# Patient Record
Sex: Male | Born: 1985 | Race: Black or African American | Hispanic: No | Marital: Single | Smoking: Current some day smoker
Health system: Southern US, Community
[De-identification: ages and names within clinical notes are randomized; demographics above are authoritative.]

## PROBLEM LIST (undated history)

## (undated) DIAGNOSIS — F419 Anxiety disorder, unspecified: Secondary | ICD-10-CM

## (undated) DIAGNOSIS — I82890 Acute embolism and thrombosis of other specified veins: Secondary | ICD-10-CM

## (undated) DIAGNOSIS — E119 Type 2 diabetes mellitus without complications: Secondary | ICD-10-CM

## (undated) DIAGNOSIS — K859 Acute pancreatitis without necrosis or infection, unspecified: Secondary | ICD-10-CM

## (undated) HISTORY — PX: NO PAST SURGERIES: SHX2092

## (undated) HISTORY — DX: Anxiety disorder, unspecified: F41.9

## (undated) HISTORY — DX: Type 2 diabetes mellitus without complications: E11.9

## (undated) HISTORY — DX: Acute pancreatitis without necrosis or infection, unspecified: K85.90

---

## 2019-07-03 ENCOUNTER — Encounter (INDEPENDENT_AMBULATORY_CARE_PROVIDER_SITE_OTHER): Payer: Self-pay

## 2019-07-03 ENCOUNTER — Encounter: Payer: Self-pay | Admitting: Nurse Practitioner

## 2019-07-03 ENCOUNTER — Other Ambulatory Visit: Payer: Self-pay

## 2019-07-03 ENCOUNTER — Ambulatory Visit (INDEPENDENT_AMBULATORY_CARE_PROVIDER_SITE_OTHER): Payer: Managed Care, Other (non HMO) | Admitting: Nurse Practitioner

## 2019-07-03 VITALS — BP 132/84 | HR 72 | Temp 98.4°F | Ht 76.08 in | Wt 187.6 lb

## 2019-07-03 DIAGNOSIS — F32A Depression, unspecified: Secondary | ICD-10-CM

## 2019-07-03 DIAGNOSIS — F329 Major depressive disorder, single episode, unspecified: Secondary | ICD-10-CM

## 2019-07-03 DIAGNOSIS — Z720 Tobacco use: Secondary | ICD-10-CM | POA: Diagnosis not present

## 2019-07-03 DIAGNOSIS — Z8669 Personal history of other diseases of the nervous system and sense organs: Secondary | ICD-10-CM

## 2019-07-03 DIAGNOSIS — Z13220 Encounter for screening for lipoid disorders: Secondary | ICD-10-CM | POA: Diagnosis not present

## 2019-07-03 DIAGNOSIS — F419 Anxiety disorder, unspecified: Secondary | ICD-10-CM

## 2019-07-03 MED ORDER — ESCITALOPRAM OXALATE 10 MG PO TABS: 10.0000 mg | ORAL_TABLET | Freq: Every day | ORAL | 1 refills | Status: DC

## 2019-07-03 NOTE — Patient Instructions (Addendum)
To start lexapro 10 mg by mouth daily for mood May take up to a month for benefit.   To work on stop smoking.   If needed we can flush ears at our office  Get These Tests  Blood Pressure- Have your blood pressure checked by your healthcare provider at least once a year.  Normal blood pressure is 120/80. Goal for most <140/90.  Weight- Have your body mass index (BMI) calculated to screen for obesity.  BMI is a measure of body fat based on height and weight.  You can calculate your own BMI at [employer]  Cholesterol- Have your cholesterol checked every year.  Diabetes- Have your blood sugar checked every year if you have high blood pressure, high cholesterol, a family history of diabetes or if you are overweight.  Pap Test - Have a pap test every 1 to 5 years if you have been sexually active.  If you are older than 65 and recent pap tests have been normal you may not need additional pap tests.  In addition, if you have had a hysterectomy  for benign disease additional pap tests are not necessary.  Mammogram-Yearly mammograms are essential for early detection of breast cancer  Screening for Colon Cancer- Colonoscopy starting at age 83. Screening may begin sooner depending on your family history and other health conditions.  Follow up colonoscopy as directed by your Gastroenterologist.  Screening for Osteoporosis- Screening begins at age 101 with bone density scanning, sooner if you are at higher risk for developing Osteoporosis.   Get these medicines  Calcium with Vitamin D- Your body requires 1200-1500 mg of Calcium a day and [phone] IU of Vitamin D a day.  You can only absorb 500 mg of Calcium at a time therefore Calcium must be taken in 2 or 3 separate doses throughout the day.  Hormones- Hormone therapy has been associated with increased risk for certain cancers and heart disease.  Talk to your healthcare provider about if you need relief from menopausal  symptoms.  Aspirin- Ask your healthcare provider about taking Aspirin to prevent Heart Disease and Stroke.   Get these Immuniztions  Flu shot- Every fall  Pneumonia shot- Once after the age of 52; if you are younger ask your healthcare provider if you need a pneumonia shot.  Tetanus- Every ten years.  Shingrix- Once after the age of 27 to prevent shingles.   Take these steps  Don't smoke- Your healthcare provider can help you quit. For tips on how to quit, ask your healthcare provider or go to www.smokefree.gov or call 1-800 QUIT-NOW.  Be physically active- Exercise 5 days a week for a minimum of 30 minutes.  If you are not already physically active, start slow and gradually work up to 30 minutes of moderate physical activity.  Try walking, dancing, bike riding, swimming, etc.  Eat a healthy diet- Eat a variety of healthy foods such as fruits, vegetables, whole grains, low fat milk, low fat cheeses, yogurt, lean meats, chicken, fish, eggs, dried beans, tofu, etc.  For more information go to www.thenutritionsource.org  Dental visit- Brush and floss teeth twice daily; visit your dentist twice a year.  Eye exam- Visit your Optometrist or Ophthalmologist yearly.  Drink alcohol in moderation- Limit alcohol intake to one drink or less a day.  Never drink and drive.  Depression- Your emotional health is as important as your physical health.  If you're feeling down or losing interest in things you normally enjoy, please talk to your  healthcare provider.  Seat Belts- can save your life; always wear one  Smoke/Carbon Monoxide detectors- These detectors need to be installed on the appropriate level of your home.  Replace batteries at least once a year.  Violence- If anyone is threatening or hurting you, please tell your healthcare provider.  Living Will/ Health care power of attorney- Discuss with your healthcare provider and family.

## 2019-07-03 NOTE — Progress Notes (Signed)
Careteam: Patient Care Team: Lauree Chandler, NP as PCP - General (Geriatric Medicine) Jodi Marble, MD as Consulting Physician (Otolaryngology)  Advanced Directive information    No Known Allergies  Chief Complaint  Patient presents with  . Establish Care    New patient establish care   . Immunizations    Refused all immunization updates      HPI: Patient is a 33 y.o. male seen in the office today to establish care. Has not had routine care prior.  Has not eaten or drank anything today.   Increase in Cerumen - follows with ENT for removal.   Anxiety/depression- starting to lose focus at work due to anxiety.  Repots he will get overwhelmed and have panic.  Feels like he needs more help with this. No SI or HI.  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 Moved to [place] for work.   Review of Systems:  Review of Systems  Constitutional: Negative for chills, fever and weight loss.  HENT: Negative for tinnitus.   Respiratory: Negative for cough, sputum production and shortness of breath.   Cardiovascular: Negative for chest pain, palpitations and leg swelling.  Gastrointestinal: Negative for abdominal pain, constipation, diarrhea and heartburn.  Genitourinary: Negative for dysuria, frequency and urgency.  Musculoskeletal: Negative for back pain, joint pain and myalgias.  Skin: Negative.   Neurological: Negative for dizziness and headaches.  Psychiatric/Behavioral: Positive for depression. The patient is nervous/anxious. The patient does not have insomnia.     History reviewed. No pertinent past medical history. History reviewed. No pertinent surgical history. Social History:   reports that he has been smoking. He has never used smokeless tobacco. He reports current alcohol use. He reports that he does not use drugs.  History reviewed. No pertinent family history.  Medications: Patient's Medications  New Prescriptions   No medications on file   Previous Medications   MULTIPLE VITAMINS-MINERALS (CENTRUM) TABLET    Take 1 tablet by mouth daily.  Modified Medications   No medications on file  Discontinued Medications   No medications on file    Physical Exam:  Vitals:   07/03/19 1351  BP: 132/84  Pulse: 72  Temp: 98.4 F (36.9 C)  TempSrc: Oral  SpO2: 95%  Weight: 187 lb 9.6 oz (85.1 kg)  Height: 6' 4.08" (1.932 m)   Body mass index is 22.79 kg/m. Wt Readings from Last 3 Encounters:  07/03/19 187 lb 9.6 oz (85.1 kg)    Physical Exam Constitutional:      General: He is not in acute distress.    Appearance: He is well-developed. He is not diaphoretic.  HENT:     Head: Normocephalic and atraumatic.     Right Ear: Tympanic membrane and ear canal normal.     Left Ear: Tympanic membrane and ear canal normal.     Nose: Nose normal.     Mouth/Throat:     Pharynx: No oropharyngeal exudate.  Eyes:     Conjunctiva/sclera: Conjunctivae normal.     Pupils: Pupils are equal, round, and reactive to light.  Neck:     Musculoskeletal: Normal range of motion and neck supple.  Cardiovascular:     Rate and Rhythm: Normal rate and regular rhythm.     Heart sounds: Normal heart sounds.  Pulmonary:     Effort: Pulmonary effort is normal.     Breath sounds: Normal breath sounds.  Abdominal:     General: Bowel sounds are normal.  Palpations: Abdomen is soft.  Musculoskeletal:        General: No tenderness.  Skin:    General: Skin is warm and dry.  Neurological:     Mental Status: He is alert and oriented to person, place, and time.  Psychiatric:        Mood and Affect: Mood normal.        Behavior: Behavior normal.     Labs reviewed: Basic Metabolic Panel: No results for input(s): NA, K, CL, CO2, GLUCOSE, BUN, CREATININE, CALCIUM, MG, PHOS, TSH in the last 8760 hours. Liver Function Tests: No results for input(s): AST, ALT, ALKPHOS, BILITOT, PROT, ALBUMIN in the last 8760 hours. No results for input(s): LIPASE,  AMYLASE in the last 8760 hours. No results for input(s): AMMONIA in the last 8760 hours. CBC: No results for input(s): WBC, NEUTROABS, HGB, HCT, MCV, PLT in the last 8760 hours. Lipid Panel: No results for input(s): CHOL, HDL, LDLCALC, TRIG, CHOLHDL, LDLDIRECT in the last 8760 hours. TSH: No results for input(s): TSH in the last 8760 hours. A1C: No results found for: HGBA1C   Assessment/Plan 1. Anxiety and depression -worsening depression and anxiety at this time. Feels like he would benefit from medication but also willing to to go therapy as well. information provided on therapy services. -encouraged to continue mediating and be outdoors -to increase physical activity - TSH - COMPLETE METABOLIC PANEL WITH GFR - CBC with Differential/Platelet  2. Tobacco use Encouraged cessation  3. Screening for lipid disorders - COMPLETE METABOLIC PANEL WITH GFR - Lipid panel  4. Hx of impacted cerumen Going to ENT for cleaning, last done 2 months ago. Ears clear at this time.  Next appt: 4 weeks, willing to get flu vaccine at follow up Pixie Burgener K. Biagio BorgEubanks, AGNP  [facility] [phone]

## 2019-07-04 LAB — COMPLETE METABOLIC PANEL WITH GFR
AG Ratio: 1.5 (calc) (ref 1.0–2.5)
ALT: 35 U/L (ref 9–46)
AST: 34 U/L (ref 10–40)
Albumin: 4.4 g/dL (ref 3.6–5.1)
Alkaline phosphatase (APISO): 63 U/L (ref 36–130)
BUN: 14 mg/dL (ref 7–25)
CO2: 28 mmol/L (ref 20–32)
Calcium: 9.9 mg/dL (ref 8.6–10.3)
Chloride: 105 mmol/L (ref 98–110)
Creat: 1.13 mg/dL (ref 0.60–1.35)
GFR, Est African American: 98 mL/min/{1.73_m2} (ref >=60)
GFR, Est Non African American: 85 mL/min/{1.73_m2} (ref >=60)
Globulin: 3 g/dL (calc) (ref 1.9–3.7)
Glucose, Bld: 99 mg/dL (ref 65–99)
Potassium: 4.7 mmol/L (ref 3.5–5.3)
Sodium: 139 mmol/L (ref 135–146)
Total Bilirubin: 0.7 mg/dL (ref 0.2–1.2)
Total Protein: 7.4 g/dL (ref 6.1–8.1)

## 2019-07-04 LAB — CBC WITH DIFFERENTIAL/PLATELET
Absolute Monocytes: 608 cells/uL (ref 200–950)
Basophils Absolute: 28 cells/uL (ref 0–200)
Basophils Relative: 0.7 %
Eosinophils Absolute: 428 cells/uL (ref 15–500)
Eosinophils Relative: 10.7 %
HCT: 42.9 % (ref 38.5–50.0)
Hemoglobin: 14.5 g/dL (ref 13.2–17.1)
Lymphs Abs: 688 cells/uL — ABNORMAL LOW (ref 850–3900)
MCH: 34.7 pg — ABNORMAL HIGH (ref 27.0–33.0)
MCHC: 33.8 g/dL (ref 32.0–36.0)
MCV: 102.6 fL — ABNORMAL HIGH (ref 80.0–100.0)
MPV: 10.2 fL (ref 7.5–12.5)
Monocytes Relative: 15.2 %
Neutro Abs: 2248 cells/uL (ref 1500–7800)
Neutrophils Relative %: 56.2 %
Platelets: 207 10*3/uL (ref 140–400)
RBC: 4.18 10*6/uL — ABNORMAL LOW (ref 4.20–5.80)
RDW: 11.1 % (ref 11.0–15.0)
Total Lymphocyte: 17.2 %
WBC: 4 10*3/uL (ref 3.8–10.8)

## 2019-07-04 LAB — LIPID PANEL
Cholesterol: 177 mg/dL (ref <200)
HDL: 68 mg/dL (ref >=40)
LDL Cholesterol (Calc): 96 mg/dL (calc)
Non-HDL Cholesterol (Calc): 109 mg/dL (calc) (ref <130)
Total CHOL/HDL Ratio: 2.6 (calc) (ref <5.0)
Triglycerides: 50 mg/dL (ref <150)

## 2019-07-04 LAB — TSH: TSH: 0.63 mIU/L (ref 0.40–4.50)

## 2019-08-01 ENCOUNTER — Ambulatory Visit: Payer: Managed Care, Other (non HMO) | Admitting: Nurse Practitioner

## 2019-08-08 ENCOUNTER — Encounter: Payer: Self-pay | Admitting: Nurse Practitioner

## 2019-08-08 ENCOUNTER — Other Ambulatory Visit: Payer: Self-pay

## 2019-08-08 ENCOUNTER — Ambulatory Visit (INDEPENDENT_AMBULATORY_CARE_PROVIDER_SITE_OTHER): Payer: Managed Care, Other (non HMO) | Admitting: Nurse Practitioner

## 2019-08-08 VITALS — BP 140/82 | HR 101 | Temp 97.8°F | Ht 76.0 in | Wt 182.0 lb

## 2019-08-08 DIAGNOSIS — F419 Anxiety disorder, unspecified: Secondary | ICD-10-CM

## 2019-08-08 DIAGNOSIS — F329 Major depressive disorder, single episode, unspecified: Secondary | ICD-10-CM | POA: Diagnosis not present

## 2019-08-08 DIAGNOSIS — Z720 Tobacco use: Secondary | ICD-10-CM

## 2019-08-08 DIAGNOSIS — F32A Depression, unspecified: Secondary | ICD-10-CM

## 2019-08-08 NOTE — Patient Instructions (Signed)
Decrease caffeine and continue to exercise.  Quit smoking

## 2019-08-08 NOTE — Progress Notes (Signed)
Careteam: Patient Care Team: Lauree Chandler, NP as PCP - General (Geriatric Medicine) Jodi Marble, MD as Consulting Physician (Otolaryngology)  Advanced Directive information    No Known Allergies  Chief Complaint  Patient presents with  . Follow-up    4 week follow-up. Patient states he is stressed and contributes this to weight loss   . Immunizations    Refused flu vaccine. Discuss need for TDaP  . Best Practice Recommendations    Discuss HIV screening recommendation      HPI: Patient is a 33 y.o. male seen in the office today to follow up anxiety. Started escitalopram and now feels like anxiety is under better control. Feeling better at work. Less anxiety attacks.   He has lost 6 lbs but has been more active in the last month. Eating a lot but very mindful of what he eats and try to be healthy   Review of Systems:  Review of Systems  Constitutional: Negative for chills, fever and weight loss.  HENT: Negative for tinnitus.   Respiratory: Negative for cough, sputum production and shortness of breath.   Cardiovascular: Negative for chest pain, palpitations and leg swelling.  Gastrointestinal: Negative for abdominal pain, constipation, diarrhea and heartburn.  Genitourinary: Negative for dysuria, frequency and urgency.  Musculoskeletal: Negative for back pain, joint pain and myalgias.  Skin: Negative.   Neurological: Negative for dizziness and headaches.  Psychiatric/Behavioral: Positive for depression. The patient is nervous/anxious. The patient does not have insomnia.        Mood has improved       History reviewed. No pertinent past medical history. History reviewed. No pertinent surgical history. Social History:   reports that he has been smoking cigars. He started smoking about 16 years ago. He has smoked for the past 15.00 years. He has never used smokeless tobacco. He reports current alcohol use. He reports that he does not use drugs.  Family History   Problem Relation Age of Onset  . Hypertension Mother   . Diabetes Maternal Grandmother   . Diabetes Maternal Grandfather     Medications: Patient's Medications  New Prescriptions   No medications on file  Previous Medications   ESCITALOPRAM (LEXAPRO) 10 MG TABLET    Take 1 tablet (10 mg total) by mouth daily.   MULTIPLE VITAMINS-MINERALS (CENTRUM) TABLET    Take 1 tablet by mouth daily.  Modified Medications   No medications on file  Discontinued Medications   No medications on file    Physical Exam:  Vitals:   08/08/19 1416  BP: 140/82  Pulse: (!) 101  Temp: 97.8 F (36.6 C)  TempSrc: Temporal  SpO2: 96%  Weight: 182 lb (82.6 kg)  Height: 6\' 4"  (1.93 m)   Body mass index is 22.15 kg/m. Wt Readings from Last 3 Encounters:  08/08/19 182 lb (82.6 kg)  07/03/19 187 lb 9.6 oz (85.1 kg)    Physical Exam Constitutional:      General: He is not in acute distress.    Appearance: He is well-developed. He is not diaphoretic.  HENT:     Head: Normocephalic and atraumatic.  Eyes:     Conjunctiva/sclera: Conjunctivae normal.     Pupils: Pupils are equal, round, and reactive to light.  Neck:     Musculoskeletal: Normal range of motion and neck supple.  Cardiovascular:     Rate and Rhythm: Normal rate and regular rhythm.     Heart sounds: Normal heart sounds.  Pulmonary:  Effort: Pulmonary effort is normal.     Breath sounds: Normal breath sounds.  Abdominal:     General: Bowel sounds are normal.     Palpations: Abdomen is soft.  Musculoskeletal:        General: No tenderness.  Skin:    General: Skin is warm and dry.  Neurological:     Mental Status: He is alert and oriented to person, place, and time.  Psychiatric:        Mood and Affect: Mood normal.        Behavior: Behavior normal.     Labs reviewed: Basic Metabolic Panel: Recent Labs    07/03/19 1435  NA 139  K 4.7  CL 105  CO2 28  GLUCOSE 99  BUN 14  CREATININE 1.13  CALCIUM 9.9  TSH 0.63    Liver Function Tests: Recent Labs    07/03/19 1435  AST 34  ALT 35  BILITOT 0.7  PROT 7.4   No results for input(s): LIPASE, AMYLASE in the last 8760 hours. No results for input(s): AMMONIA in the last 8760 hours. CBC: Recent Labs    07/03/19 1435  WBC 4.0  NEUTROABS 2,248  HGB 14.5  HCT 42.9  MCV 102.6*  PLT 207   Lipid Panel: Recent Labs    07/03/19 1435  CHOL 177  HDL 68  LDLCALC 96  TRIG 50  CHOLHDL 2.6   TSH: Recent Labs    07/03/19 1435  TSH 0.63   A1C: No results found for: HGBA1C   Assessment/Plan 1. Anxiety and depression Doing well with escitalopram. Also being more activity, continues to maintain healthy eating habits. Discuss use of caffeine and anxiety and to try to limit use.  he drank energy drink prior to coming into the office.  2. Tobacco use Encouraged cessation.  Next appt: 5 months for routine follow up and ear lavage Prudie Guthridge K. Biagio Borg  [facility] [phone]

## 2020-01-02 ENCOUNTER — Ambulatory Visit: Payer: Managed Care, Other (non HMO) | Admitting: Nurse Practitioner

## 2020-01-30 ENCOUNTER — Ambulatory Visit: Payer: Managed Care, Other (non HMO) | Admitting: Nurse Practitioner

## 2020-03-05 DIAGNOSIS — H900 Conductive hearing loss, bilateral: Secondary | ICD-10-CM | POA: Insufficient documentation

## 2020-03-05 DIAGNOSIS — H6123 Impacted cerumen, bilateral: Secondary | ICD-10-CM | POA: Insufficient documentation

## 2020-09-26 ENCOUNTER — Inpatient Hospital Stay (HOSPITAL_COMMUNITY): Payer: Medicaid Other

## 2020-09-26 ENCOUNTER — Emergency Department (HOSPITAL_COMMUNITY): Payer: Medicaid Other

## 2020-09-26 ENCOUNTER — Encounter (HOSPITAL_COMMUNITY): Payer: Self-pay | Admitting: Emergency Medicine

## 2020-09-26 ENCOUNTER — Inpatient Hospital Stay (HOSPITAL_COMMUNITY)
Admission: EM | Admit: 2020-09-26 | Discharge: 2020-10-07 | DRG: 871 | Disposition: A | Discharge status: Home / Self Care | Payer: Medicaid Other | Attending: Internal Medicine | Admitting: Internal Medicine

## 2020-09-26 DIAGNOSIS — J9811 Atelectasis: Secondary | ICD-10-CM | POA: Diagnosis not present

## 2020-09-26 DIAGNOSIS — E872 Acidosis: Secondary | ICD-10-CM | POA: Diagnosis present

## 2020-09-26 DIAGNOSIS — G9341 Metabolic encephalopathy: Secondary | ICD-10-CM | POA: Diagnosis not present

## 2020-09-26 DIAGNOSIS — F17200 Nicotine dependence, unspecified, uncomplicated: Secondary | ICD-10-CM | POA: Diagnosis present

## 2020-09-26 DIAGNOSIS — Z9289 Personal history of other medical treatment: Secondary | ICD-10-CM | POA: Diagnosis not present

## 2020-09-26 DIAGNOSIS — J90 Pleural effusion, not elsewhere classified: Secondary | ICD-10-CM | POA: Diagnosis present

## 2020-09-26 DIAGNOSIS — R188 Other ascites: Secondary | ICD-10-CM | POA: Diagnosis not present

## 2020-09-26 DIAGNOSIS — J9691 Respiratory failure, unspecified with hypoxia: Secondary | ICD-10-CM | POA: Diagnosis present

## 2020-09-26 DIAGNOSIS — F10231 Alcohol dependence with withdrawal delirium: Secondary | ICD-10-CM | POA: Diagnosis not present

## 2020-09-26 DIAGNOSIS — F17290 Nicotine dependence, other tobacco product, uncomplicated: Secondary | ICD-10-CM | POA: Diagnosis present

## 2020-09-26 DIAGNOSIS — R6520 Severe sepsis without septic shock: Secondary | ICD-10-CM | POA: Diagnosis present

## 2020-09-26 DIAGNOSIS — Z781 Physical restraint status: Secondary | ICD-10-CM

## 2020-09-26 DIAGNOSIS — Z6820 Body mass index (BMI) 20.0-20.9, adult: Secondary | ICD-10-CM

## 2020-09-26 DIAGNOSIS — I10 Essential (primary) hypertension: Secondary | ICD-10-CM | POA: Diagnosis present

## 2020-09-26 DIAGNOSIS — F1020 Alcohol dependence, uncomplicated: Secondary | ICD-10-CM | POA: Diagnosis present

## 2020-09-26 DIAGNOSIS — K449 Diaphragmatic hernia without obstruction or gangrene: Secondary | ICD-10-CM | POA: Diagnosis present

## 2020-09-26 DIAGNOSIS — K8521 Alcohol induced acute pancreatitis with uninfected necrosis: Secondary | ICD-10-CM | POA: Diagnosis present

## 2020-09-26 DIAGNOSIS — D696 Thrombocytopenia, unspecified: Secondary | ICD-10-CM | POA: Diagnosis not present

## 2020-09-26 DIAGNOSIS — Z8249 Family history of ischemic heart disease and other diseases of the circulatory system: Secondary | ICD-10-CM

## 2020-09-26 DIAGNOSIS — Z833 Family history of diabetes mellitus: Secondary | ICD-10-CM

## 2020-09-26 DIAGNOSIS — Z4659 Encounter for fitting and adjustment of other gastrointestinal appliance and device: Secondary | ICD-10-CM

## 2020-09-26 DIAGNOSIS — Z9109 Other allergy status, other than to drugs and biological substances: Secondary | ICD-10-CM | POA: Diagnosis not present

## 2020-09-26 DIAGNOSIS — J9601 Acute respiratory failure with hypoxia: Secondary | ICD-10-CM | POA: Diagnosis not present

## 2020-09-26 DIAGNOSIS — R7401 Elevation of levels of liver transaminase levels: Secondary | ICD-10-CM | POA: Diagnosis present

## 2020-09-26 DIAGNOSIS — N179 Acute kidney failure, unspecified: Secondary | ICD-10-CM | POA: Diagnosis not present

## 2020-09-26 DIAGNOSIS — E440 Moderate protein-calorie malnutrition: Secondary | ICD-10-CM | POA: Diagnosis not present

## 2020-09-26 DIAGNOSIS — F10931 Alcohol use, unspecified with withdrawal delirium: Secondary | ICD-10-CM

## 2020-09-26 DIAGNOSIS — Z9911 Dependence on respirator [ventilator] status: Secondary | ICD-10-CM

## 2020-09-26 DIAGNOSIS — K852 Alcohol induced acute pancreatitis without necrosis or infection: Secondary | ICD-10-CM | POA: Diagnosis present

## 2020-09-26 DIAGNOSIS — R059 Cough, unspecified: Secondary | ICD-10-CM

## 2020-09-26 DIAGNOSIS — K219 Gastro-esophageal reflux disease without esophagitis: Secondary | ICD-10-CM | POA: Diagnosis present

## 2020-09-26 DIAGNOSIS — I361 Nonrheumatic tricuspid (valve) insufficiency: Secondary | ICD-10-CM | POA: Diagnosis not present

## 2020-09-26 DIAGNOSIS — K8522 Alcohol induced acute pancreatitis with infected necrosis: Secondary | ICD-10-CM | POA: Diagnosis not present

## 2020-09-26 DIAGNOSIS — R739 Hyperglycemia, unspecified: Secondary | ICD-10-CM | POA: Diagnosis present

## 2020-09-26 DIAGNOSIS — R451 Restlessness and agitation: Secondary | ICD-10-CM | POA: Diagnosis not present

## 2020-09-26 DIAGNOSIS — E876 Hypokalemia: Secondary | ICD-10-CM | POA: Diagnosis not present

## 2020-09-26 DIAGNOSIS — A419 Sepsis, unspecified organism: Secondary | ICD-10-CM | POA: Diagnosis present

## 2020-09-26 DIAGNOSIS — Z20822 Contact with and (suspected) exposure to covid-19: Secondary | ICD-10-CM | POA: Diagnosis present

## 2020-09-26 DIAGNOSIS — K8591 Acute pancreatitis with uninfected necrosis, unspecified: Secondary | ICD-10-CM | POA: Diagnosis not present

## 2020-09-26 DIAGNOSIS — E781 Pure hyperglyceridemia: Secondary | ICD-10-CM | POA: Diagnosis present

## 2020-09-26 DIAGNOSIS — F419 Anxiety disorder, unspecified: Secondary | ICD-10-CM | POA: Diagnosis not present

## 2020-09-26 DIAGNOSIS — R000 Tachycardia, unspecified: Secondary | ICD-10-CM | POA: Diagnosis not present

## 2020-09-26 DIAGNOSIS — E871 Hypo-osmolality and hyponatremia: Secondary | ICD-10-CM | POA: Diagnosis not present

## 2020-09-26 DIAGNOSIS — Z9689 Presence of other specified functional implants: Secondary | ICD-10-CM

## 2020-09-26 DIAGNOSIS — K760 Fatty (change of) liver, not elsewhere classified: Secondary | ICD-10-CM | POA: Diagnosis present

## 2020-09-26 DIAGNOSIS — Z938 Other artificial opening status: Secondary | ICD-10-CM

## 2020-09-26 DIAGNOSIS — R0603 Acute respiratory distress: Secondary | ICD-10-CM

## 2020-09-26 DIAGNOSIS — K859 Acute pancreatitis without necrosis or infection, unspecified: Secondary | ICD-10-CM

## 2020-09-26 DIAGNOSIS — Z79899 Other long term (current) drug therapy: Secondary | ICD-10-CM

## 2020-09-26 DIAGNOSIS — R0902 Hypoxemia: Secondary | ICD-10-CM

## 2020-09-26 LAB — COMPREHENSIVE METABOLIC PANEL
ALT: 108 U/L — ABNORMAL HIGH (ref 0–44)
AST: 150 U/L — ABNORMAL HIGH (ref 15–41)
Albumin: 4 g/dL (ref 3.5–5.0)
Alkaline Phosphatase: 77 U/L (ref 38–126)
Anion gap: 15 (ref 5–15)
BUN: 5 mg/dL — ABNORMAL LOW (ref 6–20)
CO2: 21 mmol/L — ABNORMAL LOW (ref 22–32)
Calcium: 9.2 mg/dL (ref 8.9–10.3)
Chloride: 103 mmol/L (ref 98–111)
Creatinine, Ser: 1.07 mg/dL (ref 0.61–1.24)
GFR, Estimated: >60 mL/min (ref >60)
Glucose, Bld: 146 mg/dL — ABNORMAL HIGH (ref 70–99)
Potassium: 3.6 mmol/L (ref 3.5–5.1)
Sodium: 139 mmol/L (ref 135–145)
Total Bilirubin: 1 mg/dL (ref 0.3–1.2)
Total Protein: 7.5 g/dL (ref 6.5–8.1)

## 2020-09-26 LAB — URINALYSIS, ROUTINE W REFLEX MICROSCOPIC
Bacteria, UA: NONE SEEN
Bilirubin Urine: NEGATIVE
Glucose, UA: >=500 mg/dL — AB
Hgb urine dipstick: NEGATIVE
Ketones, ur: 20 mg/dL — AB
Leukocytes,Ua: NEGATIVE
Nitrite: NEGATIVE
Protein, ur: 30 mg/dL — AB
Specific Gravity, Urine: 1.023 (ref 1.005–1.030)
pH: 5 (ref 5.0–8.0)

## 2020-09-26 LAB — LIPID PANEL
Cholesterol: 163 mg/dL (ref 0–200)
HDL: 76 mg/dL (ref >40)
LDL Cholesterol: 78 mg/dL (ref 0–99)
Total CHOL/HDL Ratio: 2.1 RATIO
Triglycerides: 44 mg/dL (ref <150)
VLDL: 9 mg/dL (ref 0–40)

## 2020-09-26 LAB — CBC
HCT: 45.9 % (ref 39.0–52.0)
Hemoglobin: 15.6 g/dL (ref 13.0–17.0)
MCH: 36.4 pg — ABNORMAL HIGH (ref 26.0–34.0)
MCHC: 34 g/dL (ref 30.0–36.0)
MCV: 107 fL — ABNORMAL HIGH (ref 80.0–100.0)
Platelets: 160 10*3/uL (ref 150–400)
RBC: 4.29 MIL/uL (ref 4.22–5.81)
RDW: 11.7 % (ref 11.5–15.5)
WBC: 9 10*3/uL (ref 4.0–10.5)
nRBC: 0 % (ref 0.0–0.2)

## 2020-09-26 LAB — RESPIRATORY PANEL BY RT PCR (FLU A&B, COVID)
Influenza A by PCR: NEGATIVE
Influenza B by PCR: NEGATIVE
SARS Coronavirus 2 by RT PCR: NEGATIVE

## 2020-09-26 LAB — TSH: TSH: 0.391 u[IU]/mL (ref 0.350–4.500)

## 2020-09-26 LAB — HEMOGLOBIN A1C
Hgb A1c MFr Bld: 4.9 % (ref 4.8–5.6)
Mean Plasma Glucose: 93.93 mg/dL

## 2020-09-26 LAB — HIV ANTIBODY (ROUTINE TESTING W REFLEX): HIV Screen 4th Generation wRfx: NONREACTIVE

## 2020-09-26 LAB — LIPASE, BLOOD: Lipase: 2503 U/L — ABNORMAL HIGH (ref 11–51)

## 2020-09-26 LAB — LACTATE DEHYDROGENASE: LDH: 199 U/L — ABNORMAL HIGH (ref 98–192)

## 2020-09-26 MED ORDER — ACETAMINOPHEN 650 MG RE SUPP: 650.0000 mg | Freq: Four times a day (QID) | RECTAL | Status: DC | PRN

## 2020-09-26 MED ORDER — SODIUM CHLORIDE 0.9% FLUSH: 9.0000 mL | INTRAVENOUS | Status: DC | PRN

## 2020-09-26 MED ORDER — LACTATED RINGERS IV SOLN: INTRAVENOUS | Status: AC

## 2020-09-26 MED ORDER — SODIUM CHLORIDE 0.9 % IV BOLUS: 500.0000 mL | Freq: Once | INTRAVENOUS | Status: DC

## 2020-09-26 MED ORDER — SODIUM CHLORIDE 0.9 % IV BOLUS
1000.0000 mL | Freq: Once | INTRAVENOUS | Status: AC
  Administered 2020-09-26: 1000 mL via INTRAVENOUS

## 2020-09-26 MED ORDER — FOLIC ACID 1 MG PO TABS
1.0000 mg | ORAL_TABLET | Freq: Every day | ORAL | Status: DC
  Administered 2020-09-26: 1 mg via ORAL
  Filled 2020-09-26: qty 1

## 2020-09-26 MED ORDER — ADULT MULTIVITAMIN W/MINERALS CH
1.0000 | ORAL_TABLET | Freq: Every day | ORAL | Status: DC
  Administered 2020-09-26: 1 via ORAL
  Filled 2020-09-26: qty 1

## 2020-09-26 MED ORDER — LACTATED RINGERS IV SOLN: INTRAVENOUS | Status: DC

## 2020-09-26 MED ORDER — HYDRALAZINE HCL 20 MG/ML IJ SOLN
5.0000 mg | INTRAMUSCULAR | Status: DC | PRN
  Administered 2020-09-26 – 2020-09-27 (×4): 5 mg via INTRAVENOUS
  Filled 2020-09-26 (×3): qty 1

## 2020-09-26 MED ORDER — THIAMINE HCL 100 MG/ML IJ SOLN: 100.0000 mg | Freq: Every day | INTRAMUSCULAR | Status: DC

## 2020-09-26 MED ORDER — LORAZEPAM 2 MG/ML IJ SOLN
1.0000 mg | INTRAMUSCULAR | Status: DC | PRN
  Administered 2020-09-26: 3 mg via INTRAVENOUS
  Administered 2020-09-26: 1 mg via INTRAVENOUS
  Administered 2020-09-27: 2 mg via INTRAVENOUS
  Filled 2020-09-26: qty 1
  Filled 2020-09-26: qty 2
  Filled 2020-09-26: qty 1

## 2020-09-26 MED ORDER — NALOXONE HCL 0.4 MG/ML IJ SOLN: 0.4000 mg | INTRAMUSCULAR | Status: DC | PRN

## 2020-09-26 MED ORDER — HYDROMORPHONE HCL 1 MG/ML IJ SOLN
1.0000 mg | Freq: Once | INTRAMUSCULAR | Status: AC
  Administered 2020-09-26: 1 mg via INTRAVENOUS
  Filled 2020-09-26: qty 1

## 2020-09-26 MED ORDER — DIPHENHYDRAMINE HCL 12.5 MG/5ML PO ELIX: 12.5000 mg | ORAL_SOLUTION | Freq: Four times a day (QID) | ORAL | Status: DC | PRN

## 2020-09-26 MED ORDER — ACETAMINOPHEN 325 MG PO TABS
650.0000 mg | ORAL_TABLET | Freq: Four times a day (QID) | ORAL | Status: DC | PRN
  Administered 2020-09-27: 650 mg via ORAL
  Filled 2020-09-26 (×2): qty 2

## 2020-09-26 MED ORDER — DIPHENHYDRAMINE HCL 50 MG/ML IJ SOLN: 12.5000 mg | Freq: Four times a day (QID) | INTRAMUSCULAR | Status: DC | PRN

## 2020-09-26 MED ORDER — ONDANSETRON HCL 4 MG/2ML IJ SOLN: 4.0000 mg | Freq: Four times a day (QID) | INTRAMUSCULAR | Status: DC | PRN

## 2020-09-26 MED ORDER — ONDANSETRON HCL 4 MG/2ML IJ SOLN
4.0000 mg | Freq: Once | INTRAMUSCULAR | Status: AC
  Administered 2020-09-26: 4 mg via INTRAVENOUS
  Filled 2020-09-26: qty 2

## 2020-09-26 MED ORDER — LORAZEPAM 1 MG PO TABS
1.0000 mg | ORAL_TABLET | ORAL | Status: DC | PRN
  Administered 2020-09-26 (×2): 1 mg via ORAL
  Filled 2020-09-26 (×2): qty 1

## 2020-09-26 MED ORDER — IOHEXOL 300 MG/ML  SOLN
100.0000 mL | Freq: Once | INTRAMUSCULAR | Status: AC | PRN
  Administered 2020-09-26: 75 mL via INTRAVENOUS

## 2020-09-26 MED ORDER — ENOXAPARIN SODIUM 40 MG/0.4ML ~~LOC~~ SOLN
40.0000 mg | Freq: Every day | SUBCUTANEOUS | Status: DC
  Administered 2020-09-26 – 2020-10-07 (×12): 40 mg via SUBCUTANEOUS
  Filled 2020-09-26 (×12): qty 0.4

## 2020-09-26 MED ORDER — HYDROMORPHONE HCL 1 MG/ML IJ SOLN
0.5000 mg | Freq: Once | INTRAMUSCULAR | Status: AC
  Administered 2020-09-26: 0.5 mg via INTRAVENOUS
  Filled 2020-09-26: qty 1

## 2020-09-26 MED ORDER — FAMOTIDINE IN NACL 20-0.9 MG/50ML-% IV SOLN
20.0000 mg | Freq: Once | INTRAVENOUS | Status: AC
  Administered 2020-09-26: 20 mg via INTRAVENOUS
  Filled 2020-09-26: qty 50

## 2020-09-26 MED ORDER — THIAMINE HCL 100 MG PO TABS
100.0000 mg | ORAL_TABLET | Freq: Every day | ORAL | Status: DC
  Administered 2020-09-26: 100 mg via ORAL
  Filled 2020-09-26: qty 1

## 2020-09-26 MED ORDER — MORPHINE SULFATE (PF) 4 MG/ML IV SOLN
4.0000 mg | Freq: Once | INTRAVENOUS | Status: AC
  Administered 2020-09-26: 4 mg via INTRAVENOUS
  Filled 2020-09-26: qty 1

## 2020-09-26 MED ORDER — MORPHINE SULFATE 2 MG/ML IV SOLN
INTRAVENOUS | Status: DC
  Administered 2020-09-26: 5 mg via INTRAVENOUS
  Administered 2020-09-26: 1.5 mg via INTRAVENOUS
  Administered 2020-09-27: 5 mg via INTRAVENOUS
  Filled 2020-09-26: qty 25

## 2020-09-26 MED ORDER — ONDANSETRON HCL 4 MG PO TABS: 4.0000 mg | ORAL_TABLET | Freq: Four times a day (QID) | ORAL | Status: DC | PRN

## 2020-09-26 NOTE — Progress Notes (Signed)
NEW ADMISSION NOTE New Admission Note:   Arrival Method: stretcher from ED  Mental Orientation: axox4  Telemetry: 76m16  Assessment: Completed Skin: see skin assessment  IV: right hand IV  Pain: see pain assessment  Tubes: N/A Safety Measures: Safety Fall Prevention Plan has been discussed. Admission: Completed [facility] Orientation: Patient has been orientated to the room, unit and staff.  Family: GF at the bedside.  Orders have been reviewed and implemented. Will continue to monitor the patient. Call light has been placed within reach and bed alarm has been activated.   Leonia Reeves, RN, BSN

## 2020-09-26 NOTE — ED Notes (Signed)
Pt to Xray.

## 2020-09-26 NOTE — ED Provider Notes (Addendum)
[facility] EMERGENCY DEPARTMENT Provider Note   CSN: [id number] Arrival date & time: 09/26/20  0636     History Chief Complaint  Patient presents with  . Abdominal Pain    Carlos [name] is a 34 y.o. male who presents with cc of abdominal pain and vomiting. Onset 9:00pm Location epigastric- sharp, constant, gradually worsening, radiates to the back. Associated nausea with nbnb vomiting, several episodes. Drinks 8 beers a day Smokes 1 pack cigarettes daily  Takes no meds currently No diarrhea or constipation. No sick contacts No chest pain or urinary sxs. Took Ibuprofen without relief. Previous episode of the same which self resolved  HPI     History reviewed. No pertinent past medical history.  There are no problems to display for this patient.   History reviewed. No pertinent surgical history.     Family History  Problem Relation Age of Onset  . Hypertension Mother   . Diabetes Maternal Grandmother   . Diabetes Maternal Grandfather     Social History   Tobacco Use  . Smoking status: Current Every Day Smoker    Years: 15.00    Types: Cigars    Start date: 11/08/2002  . Smokeless tobacco: Never Used  . Tobacco comment: Black and Mild- 1 a day, Started at age 30   Vaping Use  . Vaping Use: Never used  Substance Use Topics  . Alcohol use: Yes    Comment: 5 drinks weekly - beer  . Drug use: Never    Home Medications Prior to Admission medications   Medication Sig Start Date End Date Taking? Authorizing Provider  escitalopram (LEXAPRO) 10 MG tablet Take 1 tablet (10 mg total) by mouth daily. 07/03/19   Sharon Seller, NP  Multiple Vitamins-Minerals (CENTRUM) tablet Take 1 tablet by mouth daily.    [provider]    Allergies    Patient has no known allergies.  Review of Systems   Review of Systems Ten systems reviewed and are negative for acute change, except as noted in the HPI.   Physical Exam Updated Vital Signs BP  (!) 149/98   Pulse (!) 101   Temp 97.8 F (36.6 C)   Resp 16   SpO2 98%   Physical Exam Vitals and nursing note reviewed.  Constitutional:      General: He is not in acute distress.    Appearance: He is well-developed. He is not diaphoretic.     Comments: Appears uncomfortable  HENT:     Head: Normocephalic and atraumatic.  Eyes:     General: No scleral icterus.    Conjunctiva/sclera: Conjunctivae normal.  Cardiovascular:     Rate and Rhythm: Normal rate and regular rhythm.     Heart sounds: Normal heart sounds.  Pulmonary:     Effort: Pulmonary effort is normal. No respiratory distress.     Breath sounds: Normal breath sounds.  Abdominal:     Palpations: Abdomen is soft.     Tenderness: There is abdominal tenderness in the epigastric area. There is guarding. There is no right CVA tenderness or left CVA tenderness.  Musculoskeletal:     Cervical back: Normal range of motion and neck supple.  Skin:    General: Skin is warm and dry.  Neurological:     Mental Status: He is alert.  Psychiatric:        Behavior: Behavior normal.     ED Results / Procedures / Treatments   Labs (all labs ordered are listed,  but only abnormal results are displayed) Labs Reviewed  LIPASE, BLOOD  COMPREHENSIVE METABOLIC PANEL  CBC  URINALYSIS, ROUTINE W REFLEX MICROSCOPIC    EKG EKG Interpretation  Date/Time:  Friday September 26 2020 06:38:30 EST Ventricular Rate:  102 PR Interval:  128 QRS Duration: 78 QT Interval:  366 QTC Calculation: 477 R Axis:   89 Text Interpretation: Sinus tachycardia Otherwise normal ECG Confirmed by Kristine Royal [phone]) on 09/26/2020 7:02:53 AM   Radiology No results found.  Procedures .Critical Care Performed by: Arthor Captain, PA-C Authorized by: Arthor Captain, PA-C   Critical care provider statement:    Critical care time (minutes):  50   Critical care time was exclusive of:  Separately billable procedures and treating other patients    Critical care was necessary to treat or prevent imminent or life-threatening deterioration of the following conditions: acute pancreatitis.   Critical care was time spent personally by me on the following activities:  Discussions with consultants, evaluation of patient's response to treatment, examination of patient, ordering and performing treatments and interventions, ordering and review of laboratory studies, ordering and review of radiographic studies, pulse oximetry, re-evaluation of patient's condition, obtaining history from patient or surrogate and review of old charts   (including critical care time)  Medications Ordered in ED Medications  sodium chloride 0.9 % bolus 1,000 mL (has no administration in time range)  HYDROmorphone (DILAUDID) injection 0.5 mg (has no administration in time range)  ondansetron (ZOFRAN) injection 4 mg (has no administration in time range)    ED Course  I have reviewed the triage vital signs and the nursing notes.  Pertinent labs & imaging results that were available during my care of the patient were reviewed by me and considered in my medical decision making (see chart for details).  Clinical Course as of Sep 26 809  Fri Sep 26, 2020  0800 Comprehensive metabolic panel(!) [AH]    Clinical Course User Index [AH] Arthor Captain, PA-C   MDM Rules/Calculators/A&P                          [id number] pain VS:  Vitals:   09/26/20 0815 09/26/20 0915 09/26/20 1114 09/26/20 1117  BP: (!) 160/93 (!) 175/111  (!) 161/121  Pulse: 73 68  80  Resp: 16 10  20   Temp:   98.2 F (36.8 C) 98.2 F (36.8 C)  TempSrc:   Oral Oral  SpO2: 98% 97%  95%    is gathered by patient and girlfriend at bedside. Previous records obtained and reviewed. DDX:The patient's complaint of abdominal pain involves an extensive number of diagnostic and treatment options, and is a complaint that carries with it a high risk of complications, morbidity, and potential  mortality. Given the large differential diagnosis, medical decision making is of high complexity. Differential diagnosis of epigastric pain includes: Functional or nonulcer dyspepsia,PUD, GERD, Gastritis, pancreatitis, pancreatic mass, overeating indigestion , gastroparesis, lactose intolerance,gastric cancer, parasitic infection,biliary colic, ACS, pericarditis, pneumonia, abdominal hernia, intestinal ischemia, esophageal rupture, gastric volvulus, hepatitis.  Labs: I ordered reviewed and interpreted labs which included  Cbc with elevated MCV CMP with with elevated MCV, no anemia, Lipase 2503  Imaging: I ordered and reviewed images which included acute abdominal film with chest, CT abdomen pelvis. I independently visualized and interpreted all imaging.  Acute abdominal film shows no free air or signs of perforation. CT imaging is currently pending EKG: Sinus tachycardia at a rate of 102 Consults:  Dr. Ophelia [facility] MDM: Patient here with acute pancreatitis.  He has been taking frequent NSAIDs and drinks greater than 8 beers a day.  He has acute pancreatitis.  I reviewed CT images which have not yet been read and it looks like he has a severe, acute inflammatory process around the pancreas.  His pain has been very poorly controlled here.  Dr. Ophelia Charter will admit the patient.  Patient has been given greater than 2 L of fluid.  Nausea is controlled. Patient disposition:The patient appears reasonably stabilized for admission considering the current resources, flow, and capabilities available in the ED at this time, and I doubt any other [facility] requiring further screening and/or treatment in the ED prior to admission.        Final Clinical Impression(s) / ED Diagnoses Final diagnoses:  None    Rx / DC Orders ED Discharge Orders    None          Arthor Captain, PA-C 09/26/20 1134    Wynetta Fines, MD 10/01/20 [phone]

## 2020-09-26 NOTE — Progress Notes (Addendum)
Cross-coverage note.   Called regarding resting HR of 120, up to 170 with activity.   HPI: Patient drinks ~8 beers/day, presented with severe upper abdominal pain and N/V, found to have elevated lipase, CT consistent with acute pancreatitis, normal triglycerides and calcium, and no biliary ductal dilatation on imaging. He was admitted with acute alcohol-induced pancreatitis, started on IVF, pain-control, and CIWA monitoring.   A&P: CIWA is ~10 for anxiety, nausea, and restlessness. Pain is controlled on morphine PCA.   Plan to give 1 mg Ativan now for mild withdrawal, give additional 1 liter IVF bolus now and then continue aggressive IV hydration at 200 cc/hr, repeat labs and reassess hydration status in am. Discussed plan with RN and patient.    ADDENDUM: Called back to bedside due to persistent tachycardia, new tachypnea, and confusion.   Patient denies complaints but has rapid shallow breathing, anxious, restless, beads of sweat on forehead.   Plan to transfer to progressive unit, continue CIWA scoring and increase intensity of Ativan dosing.

## 2020-09-26 NOTE — ED Triage Notes (Signed)
Pt reports some sharp epigastric pain with some back pain that began this morning. Denies sob. Endorses n/v. resp e/u, nad.

## 2020-09-26 NOTE — H&P (Signed)
History and Physical    Carlos [name] WUJ:[id number] DOB: 03/27/1986 DOA: 09/26/2020  PCP: Sharon SellerEubanks, Carlos K, NP Consultants:  None Patient coming from:  Home - lives with wife and 4 kids; NOK: Wife, Carlos [name], [phone]  Chief Complaint: Abdominal pain  HPI: Carlos [name] is a 34 y.o. male with no significant medical history presenting with abdominal pain.  He reports that he developed severe chest pain about 9pm yesterday.  He is actually pointing to the LUQ.  The pain woke him up from sleep.  No fever.  Nothing seems to make it better or worse.  He had a similar episode about 6 months ago but did not require hospitalization.  He drinks about 8 beers daily.  His last day of not drinking alcohol is unknown.  Denies h/o ETOH withdrawal. Denies drug use.  He does still have a gallbladder.  He takes only vitamins regularly.   ED Course:  Pancreatitis - alcoholic.  Drinks 8+ beers/day.  Started LUQ pain with radiation to the back last night.  Lipase 2503.  Elevated LFTs but not bili.  CT done with possible ileus.  Pain not controlled in ER - may need a PCA.  Review of Systems: As per HPI; otherwise review of systems reviewed and negative.   Ambulatory Status:  Ambulates without assistance  COVID Vaccine Status:    Complete  History reviewed. No pertinent past medical history.  History reviewed. No pertinent surgical history.  Social History   Socioeconomic History  . Marital status: Married    Spouse name: Not on file  . Number of children: Not on file  . Years of education: Not on file  . Highest education level: Not on file  Occupational History  . Not on file  Tobacco Use  . Smoking status: Current Every Day Smoker    Years: 15.00    Types: Cigars    Start date: 11/08/2002  . Smokeless tobacco: Never Used  . Tobacco comment: Black and Mild- 2-3 a day, Started at age 34   Vaping Use  . Vaping Use: Never used  Substance and Sexual Activity  . Alcohol use: Yes    Comment: 8  beers daily  . Drug use: Never  . Sexual activity: Not on file  Other Topics Concern  . Not on file  Social History Narrative  . Not on file   Social Determinants of Health   Financial Resource Strain:   . Difficulty of Paying Living Expenses: Not on file  Food Insecurity:   . Worried About Programme researcher, broadcasting/film/videounning Out of Food in the Last Year: Not on file  . Ran Out of Food in the Last Year: Not on file  Transportation Needs:   . Lack of Transportation (Medical): Not on file  . Lack of Transportation (Non-Medical): Not on file  Physical Activity:   . Days of Exercise per Week: Not on file  . Minutes of Exercise per Session: Not on file  Stress:   . Feeling of Stress : Not on file  Social Connections:   . Frequency of Communication with Friends and Family: Not on file  . Frequency of Social Gatherings with Friends and Family: Not on file  . Attends Religious Services: Not on file  . Active Member of Clubs or Organizations: Not on file  . Attends BankerClub or Organization Meetings: Not on file  . Marital Status: Not on file  Intimate Partner Violence:   . Fear of Current or Ex-Partner: Not on file  .  Emotionally Abused: Not on file  . Physically Abused: Not on file  . Sexually Abused: Not on file    Allergies  Allergen Reactions  . Other Anaphylaxis    [place]    Family History  Problem Relation Age of Onset  . Hypertension Mother   . Diabetes Maternal Grandmother   . Diabetes Maternal Grandfather     Prior to Admission medications   Medication Sig Start Date End Date Taking? Authorizing Provider  Multiple Vitamins-Minerals (CENTRUM) tablet Take 1 tablet by mouth daily.   Yes [provider]  escitalopram (LEXAPRO) 10 MG tablet Take 1 tablet (10 mg total) by mouth daily. Patient not taking: Reported on 09/26/2020 07/03/19   Sharon Seller, NP    Physical Exam: Vitals:   09/26/20 0700 09/26/20 0745 09/26/20 0800 09/26/20 0815  BP: (!) 160/112 (!) 174/108 (!) 164/94  (!) 160/93  Pulse: 93 77 79 73  Resp: 15 19 (!) 26 16  Temp:      SpO2: 100% 100% 100% 98%     . General:  Appears calm but uncomfortable and in NAD . Eyes:  PERRL, EOMI, normal lids, iris . ENT:  grossly normal hearing, lips & tongue, mmm; appropriate dentition . Neck:  no LAD, masses or thyromegaly . Cardiovascular:  RRR, no m/r/g. No LE edema.  Marland Kitchen Respiratory:   CTA bilaterally with no wheezes/rales/rhonchi.  Normal respiratory effort. . Abdomen:  soft, significant midepigastric and LUQ TTP, mildly distended, hypoactive BS . Skin:  no rash or induration seen on limited exam . Musculoskeletal:  grossly normal tone BUE/BLE, good ROM, no bony abnormality . Psychiatric:  blunted mood and affect, speech fluent and appropriate, AOx3 . Neurologic:  CN 2-12 grossly intact, moves all extremities in coordinated fashion    Radiological Exams on Admission: Independently reviewed - see discussion in A/P where applicable  CT ABDOMEN PELVIS W CONTRAST  Result Date: 09/26/2020 CLINICAL DATA:  Nausea vomiting and epigastric pain since 9 p.m. last night. EXAM: CT ABDOMEN AND PELVIS WITH CONTRAST TECHNIQUE: Multidetector CT imaging of the abdomen and pelvis was performed using the standard protocol following bolus administration of intravenous contrast. CONTRAST:  15mL OMNIPAQUE IOHEXOL 300 MG/ML  SOLN COMPARISON:  None. FINDINGS: Lower chest: Basilar atelectasis.  No effusion.  No consolidation. Hepatobiliary: No focal, suspicious hepatic lesion. Portal vein is patent. No pericholecystic stranding. No gross biliary duct dilation. Splenic vein also patent. Pancreas: Severe interstitial edematous pancreatitis with peripancreatic fluid tracking into the transverse mesocolon and anterior pararenal space., early acute pancreatic fluid collections. Enhancement of the gland is diminished diffusely node not definite this time for pancreatic necrosis. Spleen: Normal Adrenals/Urinary Tract: Adrenal glands are  normal. Symmetric renal enhancement.  Urinary bladder is unremarkable. Stomach/Bowel: Stomach with signs of mild edema due to adjacent inflammation. Small hiatal hernia and mild circumferential distal esophageal thickening. Small bowel is of normal caliber. The appendix is normal. Mild colonic edema in the setting of acute pancreatitis. Vascular/Lymphatic: Patent splenic and portal vein as described. Patent SMV. Normal caliber abdominal aorta. There is no gastrohepatic or hepatoduodenal ligament lymphadenopathy. No retroperitoneal or mesenteric lymphadenopathy. Reproductive: Prostate unremarkable by CT. No pelvic lymphadenopathy Other: Signs of ascites.  No free air. Musculoskeletal: No acute bone finding. No destructive bone process. IMPRESSION: 1. Severe interstitial edematous pancreatitis with early acute pancreatic collections. 2. Diminished enhancement throughout the gland not definitive at this time for signs of necrosis, perhaps due to severe pancreatic edema. Consider close follow-up based on clinical parameters to  exclude the possibility of developing glandular necrosis. 3. Signs of ascites. 4. Small hiatal hernia and mild circumferential distal esophageal thickening. Correlate with any clinical signs of esophagitis. 5. Probable hepatic steatosis. Electronically Signed   By: Donzetta Kohut M.D.   On: 09/26/2020 10:14   DG ABD ACUTE 2+V W 1V CHEST  Result Date: 09/26/2020 CLINICAL DATA:  Abdominal pain, chest pain and abdominal pain, anteromedial chest pain in a 34 year old EXAM: DG ABDOMEN ACUTE WITH 1 VIEW CHEST COMPARISON:  None FINDINGS: Trachea midline. Cardiomediastinal contours and hilar structures are normal. Lungs are clear. No sign of effusion. No free air beneath either RIGHT or LEFT hemidiaphragm. Mildly dilated small bowel loops in the LEFT mid central abdomen. Stool and gas in the colon without definite rectal gas. No abnormal calcifications.  No organomegaly. On limited assessment no  acute skeletal process. IMPRESSION: 1. No acute cardiopulmonary disease. 2. Mildly dilated small bowel loops in the mid abdomen on the LEFT may represent ileus or early partial small bowel obstruction. Electronically Signed   By: Donzetta Kohut M.D.   On: 09/26/2020 09:04    EKG: Independently reviewed.  Sinus tachycardia with rate 102; no evidence of acute ischemia   Labs on Admission: I have personally reviewed the available labs and imaging studies at the time of the admission.  Pertinent labs:   Glucose 146 Lipase 2503 AST 150/ALT 108 WBC 9.0   Assessment/Plan Principal Problem:   Acute alcoholic pancreatitis Active Problems:   Alcohol dependence syndrome (HCC)   Tobacco dependence   Acute alcoholic pancreatitis -Patient with possible prior episode of pancreatitis (resolved spontaneously), now presenting with similar symptoms -Now with frank pancreatitis by H&P, significantly elevated lipase, and very abnormal CT -His LDH is pending, but assuming it is <300, his score is currently 0 with a mortality risk of 0.9%. -Will admit; given current tachycardia will observe on telemetry -Strict NPO for now -Aggressive IVF hydration at least for the first 12 hours with LR at 200 cc/hr -Pain control with morphine pca due to persistently uncontrolled pain in the ER   -Nausea control with Zofran -The 4 most likely causes for pancreatitis include:             -Gallstones - will check RUQ [place]             -Alcohol - very likely the cause of his illness, and this has ben discussed.             -Medications - He denies taking regular medications other than vitamins.                  -Hypertriglyceridemia - Will check a fasting lipid profile -Given the severity of findings on his CT scan, GI consultation has been requested and Dr. Bosie Clos will see.  Alcohol dependence -Patient with chronic ETOH dependence -Number of drinks per day: 8 -Number of admissions for management of alcohol  withdrawal syndrome (detox): 0 -History of withdrawal seizures, ICU admissions, DTs: 0 -He is at risk for complications of withdrawal including seizures, DTs -CIWA protocol -Folate, thiamine, and MVI ordered -Will provide symptom-triggered BZD (ativan per CIWA protocol) only since the patient is able to communicate; is not showing current signs of delirium; and has no history of severe withdrawal. -TOC team consult for possible inpatient treatment/substance abuse education -Will also check UDS. -Elevated LFTs are likely related to alcoholism -Consider offering a medication for Alcohol Use Disorder at the time of d/c, to include Disulfuram; Naltrexone; or  Acamprosate.  Tobacco dependence -Encourage cessation.   -This was discussed with the patient and should be reviewed on an ongoing basis.   -Patch declined by patient     Note: This patient has been tested and is pending for the novel coronavirus COVID-19. The patient has been vaccinated against COVID-19 with a single shot; he is unsure but thinks it may have been the J&J vaccine.    DVT prophylaxis:   Lovenox  Code Status:  Full  Family Communication: Wife was present throughout evaluation. Disposition Plan:  The patient is from: home  Anticipated d/c is to: home without [facility] services  Anticipated d/c date will depend on clinical response to treatment, but likely will require several days of hospitalization  Patient is currently: acutely ill Consults called: GI; TOC team Admission status:  Admit - It is my clinical opinion that admission to INPATIENT is reasonable and necessary because of the expectation that this patient will require hospital care that crosses at least 2 midnights to treat this condition based on the medical complexity of the problems presented.  Given the aforementioned information, the predictability of an adverse outcome is felt to be significant.    Jonah Blue MD [facility]   How to contact the  [facility] Attending or Consulting provider 7A - 7P or covering provider during after hours 7P -7A, for this patient?  1. Check the care team in [facility] and look for a) attending/consulting TRH provider listed and b) the [facility] team listed 2. Log into [email or web address] and use [place]'s universal password to access. If you do not have the password, please contact the hospital operator. 3. Locate the [facility] provider you are looking for under [facility] and page to a number that you can be directly reached. 4. If you still have difficulty reaching the provider, please page the [facility] (Director on Call) for the Hospitalists listed on amion for assistance.   09/26/2020, 10:51 AM

## 2020-09-26 NOTE — Consult Note (Signed)
Referring Provider: Dr. Ophelia Charter Primary Care Physician:  Sharon Seller, NP Primary Gastroenterologist:  Gentry Fitz  Reason for Consultation:  Pancreatitis  HPI: Carlos [name] is a 34 y.o. male with acute onset of severe upper quadrant abdominal pain and several nonbloody episodes of vomiting. Denies F/C/hematemesis/diarrhea/hematochezia. His wife reports similar recurrent episodes of abdominal pain that last several days and resolve but she denies that he has ever been diagnosed with pancreatitis. He is too somnolent to give me any history. Alcohol abuse drinking multiple beers and liquor per day (she says "all he can get his hands on." Denies NSAIDs. Lipase 2,503, ALP 77, AST 150, ALT 108, TB 1.0.   History reviewed. No pertinent past medical history.  History reviewed. No pertinent surgical history.  Prior to Admission medications   Medication Sig Start Date End Date Taking? Authorizing Provider  Multiple Vitamins-Minerals (CENTRUM) tablet Take 1 tablet by mouth daily.   Yes [provider]    Scheduled Meds: . enoxaparin (LOVENOX) injection  40 mg Subcutaneous Daily  . folic acid  1 mg Oral Daily  . morphine   Intravenous Q4H  . multivitamin with minerals  1 tablet Oral Daily  . thiamine  100 mg Oral Daily   Or  . thiamine  100 mg Intravenous Daily   Continuous Infusions: . lactated ringers 200 mL/hr at 09/26/20 1221   PRN Meds:.acetaminophen **OR** acetaminophen, diphenhydrAMINE **OR** diphenhydrAMINE, hydrALAZINE, LORazepam **OR** LORazepam, naloxone **AND** sodium chloride flush, ondansetron **OR** ondansetron (ZOFRAN) IV  Allergies as of 09/26/2020 - Review Complete 09/26/2020  Allergen Reaction Noted  . Other Anaphylaxis 09/26/2020    Family History  Problem Relation Age of Onset  . Hypertension Mother   . Diabetes Maternal Grandmother   . Diabetes Maternal Grandfather     Social History   Socioeconomic History  . Marital status: Married    Spouse name:  Not on file  . Number of children: Not on file  . Years of education: Not on file  . Highest education level: Not on file  Occupational History  . Not on file  Tobacco Use  . Smoking status: Current Every Day Smoker    Years: 15.00    Types: Cigars    Start date: 11/08/2002  . Smokeless tobacco: Never Used  . Tobacco comment: Black and Mild- 2-3 a day, Started at age 30   Vaping Use  . Vaping Use: Never used  Substance and Sexual Activity  . Alcohol use: Yes    Comment: 8 beers daily  . Drug use: Never  . Sexual activity: Not on file  Other Topics Concern  . Not on file  Social History Narrative  . Not on file   Social Determinants of Health   Financial Resource Strain:   . Difficulty of Paying Living Expenses: Not on file  Food Insecurity:   . Worried About Programme researcher, broadcasting/film/video in the Last Year: Not on file  . Ran Out of Food in the Last Year: Not on file  Transportation Needs:   . Lack of Transportation (Medical): Not on file  . Lack of Transportation (Non-Medical): Not on file  Physical Activity:   . Days of Exercise per Week: Not on file  . Minutes of Exercise per Session: Not on file  Stress:   . Feeling of Stress : Not on file  Social Connections:   . Frequency of Communication with Friends and Family: Not on file  . Frequency of Social Gatherings with Friends and Family: Not  on file  . Attends Religious Services: Not on file  . Active Member of Clubs or Organizations: Not on file  . Attends Banker Meetings: Not on file  . Marital Status: Not on file  Intimate Partner Violence:   . Fear of Current or Ex-Partner: Not on file  . Emotionally Abused: Not on file  . Physically Abused: Not on file  . Sexually Abused: Not on file    Review of Systems: All negative except as stated above in HPI.  Physical Exam: Vital signs: Vitals:   09/26/20 1500 09/26/20 1525  BP: (!) 168/110   Pulse: 86   Resp:  (!) 25  Temp:    SpO2:  98%   Last BM Date:  09/25/20 General:  Somnolent, multiple tattoos, thin, no acute distress  Head: normocephalic, atraumatic Eyes: anicteric sclera ENT: oropharynx clear Neck: supple, nontender Lungs:  Clear throughout to auscultation.   No wheezes, crackles, or rhonchi. No acute distress. Heart:  Regular rate and rhythm; no murmurs, clicks, rubs,  or gallops. Abdomen: upper quadrant tenderness with guarding, soft, nondistended, +BS Rectal:  Deferred Ext: no edema  GI:  Lab Results: Recent Labs    09/26/20 0648  WBC 9.0  HGB 15.6  HCT 45.9  PLT 160   BMET Recent Labs    09/26/20 0648  NA 139  K 3.6  CL 103  CO2 21*  GLUCOSE 146*  BUN 5*  CREATININE 1.07  CALCIUM 9.2   LFT Recent Labs    09/26/20 0648  PROT 7.5  ALBUMIN 4.0  AST 150*  ALT 108*  ALKPHOS 77  BILITOT 1.0   PT/INR No results for input(s): LABPROT, INR in the last 72 hours.   Studies/Results: CT ABDOMEN PELVIS W CONTRAST  Result Date: 09/26/2020 CLINICAL DATA:  Nausea vomiting and epigastric pain since 9 p.m. last night. EXAM: CT ABDOMEN AND PELVIS WITH CONTRAST TECHNIQUE: Multidetector CT imaging of the abdomen and pelvis was performed using the standard protocol following bolus administration of intravenous contrast. CONTRAST:  68mL OMNIPAQUE IOHEXOL 300 MG/ML  SOLN COMPARISON:  None. FINDINGS: Lower chest: Basilar atelectasis.  No effusion.  No consolidation. Hepatobiliary: No focal, suspicious hepatic lesion. Portal vein is patent. No pericholecystic stranding. No gross biliary duct dilation. Splenic vein also patent. Pancreas: Severe interstitial edematous pancreatitis with peripancreatic fluid tracking into the transverse mesocolon and anterior pararenal space., early acute pancreatic fluid collections. Enhancement of the gland is diminished diffusely node not definite this time for pancreatic necrosis. Spleen: Normal Adrenals/Urinary Tract: Adrenal glands are normal. Symmetric renal enhancement.  Urinary bladder is  unremarkable. Stomach/Bowel: Stomach with signs of mild edema due to adjacent inflammation. Small hiatal hernia and mild circumferential distal esophageal thickening. Small bowel is of normal caliber. The appendix is normal. Mild colonic edema in the setting of acute pancreatitis. Vascular/Lymphatic: Patent splenic and portal vein as described. Patent SMV. Normal caliber abdominal aorta. There is no gastrohepatic or hepatoduodenal ligament lymphadenopathy. No retroperitoneal or mesenteric lymphadenopathy. Reproductive: Prostate unremarkable by CT. No pelvic lymphadenopathy Other: Signs of ascites.  No free air. Musculoskeletal: No acute bone finding. No destructive bone process. IMPRESSION: 1. Severe interstitial edematous pancreatitis with early acute pancreatic collections. 2. Diminished enhancement throughout the gland not definitive at this time for signs of necrosis, perhaps due to severe pancreatic edema. Consider close follow-up based on clinical parameters to exclude the possibility of developing glandular necrosis. 3. Signs of ascites. 4. Small hiatal hernia and mild circumferential distal esophageal thickening. Correlate  with any clinical signs of esophagitis. 5. Probable hepatic steatosis. Electronically Signed   By: Donzetta Kohut M.D.   On: 09/26/2020 10:14   DG ABD ACUTE 2+V W 1V CHEST  Result Date: 09/26/2020 CLINICAL DATA:  Abdominal pain, chest pain and abdominal pain, anteromedial chest pain in a 34 year old EXAM: DG ABDOMEN ACUTE WITH 1 VIEW CHEST COMPARISON:  None FINDINGS: Trachea midline. Cardiomediastinal contours and hilar structures are normal. Lungs are clear. No sign of effusion. No free air beneath either RIGHT or LEFT hemidiaphragm. Mildly dilated small bowel loops in the LEFT mid central abdomen. Stool and gas in the colon without definite rectal gas. No abnormal calcifications.  No organomegaly. On limited assessment no acute skeletal process. IMPRESSION: 1. No acute  cardiopulmonary disease. 2. Mildly dilated small bowel loops in the mid abdomen on the LEFT may represent ileus or early partial small bowel obstruction. Electronically Signed   By: Donzetta Kohut M.D.   On: 09/26/2020 09:04    Impression/Plan: Acute pancreatitis due to alcohol - doubt biliary source. U/S pending. Bowel rest. IVFs. CIWA protocol. Supportive care. [facility] will f/u.    LOS: 0 days   Shirley Friar  09/26/2020, 3:29 PM  Questions please call [phone]

## 2020-09-26 NOTE — ED Notes (Signed)
Admitting at bedside 

## 2020-09-27 ENCOUNTER — Inpatient Hospital Stay (HOSPITAL_COMMUNITY): Payer: Medicaid Other

## 2020-09-27 ENCOUNTER — Other Ambulatory Visit (HOSPITAL_COMMUNITY): Payer: Medicaid Other

## 2020-09-27 DIAGNOSIS — F10231 Alcohol dependence with withdrawal delirium: Secondary | ICD-10-CM

## 2020-09-27 DIAGNOSIS — R7401 Elevation of levels of liver transaminase levels: Secondary | ICD-10-CM | POA: Diagnosis present

## 2020-09-27 DIAGNOSIS — R6520 Severe sepsis without septic shock: Secondary | ICD-10-CM

## 2020-09-27 DIAGNOSIS — A419 Sepsis, unspecified organism: Principal | ICD-10-CM

## 2020-09-27 DIAGNOSIS — E871 Hypo-osmolality and hyponatremia: Secondary | ICD-10-CM | POA: Diagnosis present

## 2020-09-27 DIAGNOSIS — F10931 Alcohol use, unspecified with withdrawal delirium: Secondary | ICD-10-CM

## 2020-09-27 DIAGNOSIS — R739 Hyperglycemia, unspecified: Secondary | ICD-10-CM

## 2020-09-27 LAB — BRAIN NATRIURETIC PEPTIDE: B Natriuretic Peptide: 77.3 pg/mL (ref 0.0–100.0)

## 2020-09-27 LAB — CK: Total CK: 190 U/L (ref 49–397)

## 2020-09-27 LAB — PHOSPHORUS: Phosphorus: 1.7 mg/dL — ABNORMAL LOW (ref 2.5–4.6)

## 2020-09-27 LAB — BASIC METABOLIC PANEL
Anion gap: 9 (ref 5–15)
BUN: 8 mg/dL (ref 6–20)
CO2: 23 mmol/L (ref 22–32)
Calcium: 8.2 mg/dL — ABNORMAL LOW (ref 8.9–10.3)
Chloride: 99 mmol/L (ref 98–111)
Creatinine, Ser: 1.2 mg/dL (ref 0.61–1.24)
GFR, Estimated: >60 mL/min (ref >60)
Glucose, Bld: 227 mg/dL — ABNORMAL HIGH (ref 70–99)
Potassium: 4.6 mmol/L (ref 3.5–5.1)
Sodium: 131 mmol/L — ABNORMAL LOW (ref 135–145)

## 2020-09-27 LAB — COMPREHENSIVE METABOLIC PANEL
ALT: 59 U/L — ABNORMAL HIGH (ref 0–44)
AST: 71 U/L — ABNORMAL HIGH (ref 15–41)
Albumin: 3.1 g/dL — ABNORMAL LOW (ref 3.5–5.0)
Alkaline Phosphatase: 66 U/L (ref 38–126)
Anion gap: 12 (ref 5–15)
BUN: 6 mg/dL (ref 6–20)
CO2: 25 mmol/L (ref 22–32)
Calcium: 8.1 mg/dL — ABNORMAL LOW (ref 8.9–10.3)
Chloride: 96 mmol/L — ABNORMAL LOW (ref 98–111)
Creatinine, Ser: 1.06 mg/dL (ref 0.61–1.24)
GFR, Estimated: >60 mL/min (ref >60)
Glucose, Bld: 175 mg/dL — ABNORMAL HIGH (ref 70–99)
Potassium: 3.9 mmol/L (ref 3.5–5.1)
Sodium: 133 mmol/L — ABNORMAL LOW (ref 135–145)
Total Bilirubin: 1.5 mg/dL — ABNORMAL HIGH (ref 0.3–1.2)
Total Protein: 6.2 g/dL — ABNORMAL LOW (ref 6.5–8.1)

## 2020-09-27 LAB — VITAMIN B12: Vitamin B-12: 434 pg/mL (ref 180–914)

## 2020-09-27 LAB — CBC
HCT: 48.2 % (ref 39.0–52.0)
Hemoglobin: 16.4 g/dL (ref 13.0–17.0)
MCH: 36.4 pg — ABNORMAL HIGH (ref 26.0–34.0)
MCHC: 34 g/dL (ref 30.0–36.0)
MCV: 107.1 fL — ABNORMAL HIGH (ref 80.0–100.0)
Platelets: 116 10*3/uL — ABNORMAL LOW (ref 150–400)
RBC: 4.5 MIL/uL (ref 4.22–5.81)
RDW: 12 % (ref 11.5–15.5)
WBC: 16.3 10*3/uL — ABNORMAL HIGH (ref 4.0–10.5)
nRBC: 0.1 % (ref 0.0–0.2)

## 2020-09-27 LAB — HEPATITIS PANEL, ACUTE
HCV Ab: NONREACTIVE
Hep A IgM: NONREACTIVE
Hep B C IgM: NONREACTIVE
Hepatitis B Surface Ag: NONREACTIVE

## 2020-09-27 LAB — FOLATE: Folate: 21.6 ng/mL (ref >5.9)

## 2020-09-27 LAB — BLOOD GAS, VENOUS
Acid-Base Excess: 1.9 mmol/L (ref 0.0–2.0)
Bicarbonate: 25.4 mmol/L (ref 20.0–28.0)
FIO2: 21
O2 Saturation: 43.5 %
Patient temperature: 37
pCO2, Ven: 35.9 mmHg — ABNORMAL LOW (ref 44.0–60.0)
pH, Ven: 7.463 — ABNORMAL HIGH (ref 7.250–7.430)

## 2020-09-27 LAB — LACTIC ACID, PLASMA
Lactic Acid, Venous: 2.2 mmol/L (ref 0.5–1.9)
Lactic Acid, Venous: 2.6 mmol/L (ref 0.5–1.9)

## 2020-09-27 LAB — RAPID URINE DRUG SCREEN, HOSP PERFORMED
Amphetamines: NOT DETECTED
Barbiturates: NOT DETECTED
Benzodiazepines: NOT DETECTED
Cocaine: NOT DETECTED
Opiates: POSITIVE — AB
Tetrahydrocannabinol: NOT DETECTED

## 2020-09-27 LAB — TROPONIN I (HIGH SENSITIVITY)
Troponin I (High Sensitivity): 13 ng/L (ref <18)
Troponin I (High Sensitivity): 19 ng/L — ABNORMAL HIGH (ref <18)
Troponin I (High Sensitivity): 23 ng/L — ABNORMAL HIGH (ref <18)

## 2020-09-27 LAB — HEMOGLOBIN A1C
Hgb A1c MFr Bld: 4.9 % (ref 4.8–5.6)
Mean Plasma Glucose: 93.93 mg/dL

## 2020-09-27 LAB — MAGNESIUM
Magnesium: 1 mg/dL — ABNORMAL LOW (ref 1.7–2.4)
Magnesium: 2.1 mg/dL (ref 1.7–2.4)

## 2020-09-27 LAB — T4, FREE: Free T4: 0.93 ng/dL (ref 0.61–1.12)

## 2020-09-27 LAB — LIPASE, BLOOD: Lipase: 549 U/L — ABNORMAL HIGH (ref 11–51)

## 2020-09-27 LAB — ETHANOL: Alcohol, Ethyl (B): <10 mg/dL (ref <10)

## 2020-09-27 LAB — PROCALCITONIN: Procalcitonin: 3.36 ng/mL

## 2020-09-27 MED ORDER — THIAMINE HCL 100 MG PO TABS: 100.0000 mg | ORAL_TABLET | Freq: Every day | ORAL | Status: DC

## 2020-09-27 MED ORDER — LORAZEPAM 2 MG/ML IJ SOLN
0.5000 mg | Freq: Three times a day (TID) | INTRAMUSCULAR | Status: DC
  Administered 2020-09-27: 0.5 mg via INTRAVENOUS

## 2020-09-27 MED ORDER — LORAZEPAM 2 MG/ML IJ SOLN: 1.0000 mg | Freq: Once | INTRAMUSCULAR | Status: DC

## 2020-09-27 MED ORDER — CHLORDIAZEPOXIDE HCL 5 MG PO CAPS: 5.0000 mg | ORAL_CAPSULE | Freq: Three times a day (TID) | ORAL | Status: DC | PRN

## 2020-09-27 MED ORDER — LORAZEPAM 1 MG PO TABS: 1.0000 mg | ORAL_TABLET | ORAL | Status: DC | PRN

## 2020-09-27 MED ORDER — LORAZEPAM 2 MG/ML IJ SOLN
0.0000 mg | INTRAMUSCULAR | Status: DC
  Administered 2020-09-27: 2 mg via INTRAVENOUS
  Filled 2020-09-27: qty 1
  Filled 2020-09-27: qty 2

## 2020-09-27 MED ORDER — CHLORDIAZEPOXIDE HCL 5 MG PO CAPS: 5.0000 mg | ORAL_CAPSULE | Freq: Three times a day (TID) | ORAL | Status: DC

## 2020-09-27 MED ORDER — LORAZEPAM 2 MG/ML IJ SOLN: 0.5000 mg | Freq: Once | INTRAMUSCULAR | Status: DC

## 2020-09-27 MED ORDER — DILTIAZEM HCL 25 MG/5ML IV SOLN
10.0000 mg | INTRAVENOUS | Status: AC
  Administered 2020-09-27: 10 mg via INTRAVENOUS
  Filled 2020-09-27: qty 5

## 2020-09-27 MED ORDER — LORAZEPAM 2 MG/ML IJ SOLN
0.0000 mg | INTRAMUSCULAR | Status: DC
  Administered 2020-09-27: 4 mg via INTRAVENOUS
  Administered 2020-09-28: 1 mg via INTRAVENOUS
  Administered 2020-09-28: 4 mg via INTRAVENOUS
  Filled 2020-09-27 (×5): qty 2

## 2020-09-27 MED ORDER — LORAZEPAM 2 MG/ML IJ SOLN
1.0000 mg | INTRAMUSCULAR | Status: DC | PRN
  Administered 2020-09-27: 4 mg via INTRAVENOUS
  Administered 2020-09-27: 3 mg via INTRAVENOUS
  Administered 2020-09-27: 4 mg via INTRAVENOUS
  Filled 2020-09-27 (×3): qty 2

## 2020-09-27 MED ORDER — PIPERACILLIN-TAZOBACTAM 3.375 G IVPB 30 MIN: 3.3750 g | Freq: Four times a day (QID) | INTRAVENOUS | Status: DC

## 2020-09-27 MED ORDER — LABETALOL HCL 5 MG/ML IV SOLN
5.0000 mg | INTRAVENOUS | Status: DC | PRN
  Administered 2020-09-27 – 2020-10-02 (×11): 5 mg via INTRAVENOUS
  Filled 2020-09-27 (×12): qty 4

## 2020-09-27 MED ORDER — LORAZEPAM 2 MG/ML IJ SOLN
INTRAMUSCULAR | Status: AC
  Administered 2020-09-27: 2 mg
  Filled 2020-09-27: qty 1

## 2020-09-27 MED ORDER — LORAZEPAM 2 MG/ML IJ SOLN: 0.0000 mg | Freq: Three times a day (TID) | INTRAMUSCULAR | Status: DC

## 2020-09-27 MED ORDER — SODIUM CHLORIDE 0.45 % IV BOLUS
1000.0000 mL | Freq: Once | INTRAVENOUS | Status: AC
  Administered 2020-09-27: 1000 mL via INTRAVENOUS

## 2020-09-27 MED ORDER — LORAZEPAM 2 MG/ML IJ SOLN: 0.5000 mg | INTRAMUSCULAR | Status: DC | PRN

## 2020-09-27 MED ORDER — THIAMINE HCL 100 MG/ML IJ SOLN
100.0000 mg | Freq: Every day | INTRAMUSCULAR | Status: DC
  Administered 2020-09-28: 100 mg via INTRAVENOUS
  Filled 2020-09-27 (×2): qty 2

## 2020-09-27 MED ORDER — SODIUM CHLORIDE 0.9 % IV SOLN: INTRAVENOUS | Status: AC

## 2020-09-27 MED ORDER — MORPHINE SULFATE (PF) 2 MG/ML IV SOLN
2.0000 mg | INTRAVENOUS | Status: DC | PRN
  Administered 2020-09-27: 4 mg via INTRAVENOUS
  Filled 2020-09-27: qty 2

## 2020-09-27 MED ORDER — LABETALOL HCL 5 MG/ML IV SOLN
5.0000 mg | INTRAVENOUS | Status: DC | PRN
  Administered 2020-09-27: 5 mg via INTRAVENOUS
  Filled 2020-09-27: qty 4

## 2020-09-27 MED ORDER — LORAZEPAM 2 MG/ML IJ SOLN
2.0000 mg | INTRAMUSCULAR | Status: AC
  Administered 2020-09-27: 2 mg via INTRAVENOUS
  Filled 2020-09-27: qty 1

## 2020-09-27 MED ORDER — SODIUM CHLORIDE 0.9 % IV SOLN: INTRAVENOUS | Status: DC

## 2020-09-27 MED ORDER — LORAZEPAM 2 MG/ML IJ SOLN
0.5000 mg | Freq: Four times a day (QID) | INTRAMUSCULAR | Status: DC | PRN
  Administered 2020-09-27: 0.5 mg via INTRAVENOUS
  Filled 2020-09-27: qty 1
  Filled 2020-09-27: qty 0.25

## 2020-09-27 MED ORDER — CHLORDIAZEPOXIDE HCL 5 MG PO CAPS
5.0000 mg | ORAL_CAPSULE | Freq: Four times a day (QID) | ORAL | Status: DC
  Administered 2020-09-27: 5 mg via ORAL
  Filled 2020-09-27 (×2): qty 1

## 2020-09-27 MED ORDER — LABETALOL HCL 5 MG/ML IV SOLN
10.0000 mg | INTRAVENOUS | Status: DC | PRN
  Administered 2020-09-27 (×2): 10 mg via INTRAVENOUS
  Filled 2020-09-27 (×2): qty 4

## 2020-09-27 MED ORDER — MAGNESIUM SULFATE IN D5W 1-5 GM/100ML-% IV SOLN: 1.0000 g | Freq: Once | INTRAVENOUS | Status: DC

## 2020-09-27 MED ORDER — PANTOPRAZOLE SODIUM 40 MG IV SOLR
40.0000 mg | Freq: Two times a day (BID) | INTRAVENOUS | Status: DC
  Administered 2020-09-27 – 2020-10-07 (×21): 40 mg via INTRAVENOUS
  Filled 2020-09-27 (×21): qty 40

## 2020-09-27 MED ORDER — CHLORDIAZEPOXIDE HCL 10 MG PO CAPS: 10.0000 mg | ORAL_CAPSULE | Freq: Three times a day (TID) | ORAL | Status: DC

## 2020-09-27 MED ORDER — THIAMINE HCL 100 MG/ML IJ SOLN
500.0000 mg | Freq: Once | INTRAVENOUS | Status: AC
  Administered 2020-09-27: 500 mg via INTRAVENOUS
  Filled 2020-09-27: qty 5

## 2020-09-27 MED ORDER — MAGNESIUM SULFATE 2 GM/50ML IV SOLN
2.0000 g | Freq: Once | INTRAVENOUS | Status: AC
  Administered 2020-09-27: 2 g via INTRAVENOUS
  Filled 2020-09-27: qty 50

## 2020-09-27 MED ORDER — LORAZEPAM 2 MG/ML IJ SOLN
2.0000 mg | Freq: Once | INTRAMUSCULAR | Status: AC
  Administered 2020-09-27: 2 mg via INTRAVENOUS
  Filled 2020-09-27: qty 1

## 2020-09-27 MED ORDER — LORAZEPAM 2 MG/ML IJ SOLN
1.0000 mg | INTRAMUSCULAR | Status: DC | PRN
  Administered 2020-09-27 – 2020-09-28 (×2): 4 mg via INTRAVENOUS
  Administered 2020-09-28: 3 mg via INTRAVENOUS
  Filled 2020-09-27: qty 2

## 2020-09-27 MED ORDER — PIPERACILLIN-TAZOBACTAM 3.375 G IVPB
3.3750 g | Freq: Three times a day (TID) | INTRAVENOUS | Status: DC
  Administered 2020-09-27 – 2020-09-28 (×4): 3.375 g via INTRAVENOUS
  Filled 2020-09-27 (×5): qty 50

## 2020-09-27 NOTE — Assessment & Plan Note (Deleted)
Blood pressure (!) 135/92, pulse (!) 145, temperature 99 F (37.2 C), temperature source Oral, resp. rate (!) 51, height 6\' 5"  (1.956 m), weight 80.1 kg, SpO2 95 %. Leukocytosis secondary to acute pancreatitis, patient meets SIRS criteria on admission and currently. Lactic acid is pending. I do not suspect infection related SIRs, or sepsis.

## 2020-09-27 NOTE — Procedures (Signed)
Heart rate too high for accurate echo at this time. 

## 2020-09-27 NOTE — Progress Notes (Signed)
This social worker consulted to provide substance abuse resources for patient. Patient now going through withdrawals. It was suggested that this Child psychotherapist return at a later time.  Transition of care will continue to follow.   [address] LCSW Transition of Care [phone]

## 2020-09-27 NOTE — Assessment & Plan Note (Signed)
Blood pressure (!) 132/99, pulse (!) 133, temperature 99 F (37.2 C), temperature source Oral, resp. rate (!) 43, height 6\' 5"  (1.956 m), weight 80.1 kg, SpO2 95 %. Patient afebrile overnight, repeat lipase is pending. Lipid panel reviewed Results for Carlos, [name] (MRN [name]) as of 09/27/2020 07:59  Ref. Range 09/26/2020 12:11  Total CHOL/HDL Ratio Latest Units: RATIO 2.1  Cholesterol Latest Ref Range: 0 - 200 mg/dL 09/28/2020  HDL Cholesterol Latest Ref Range: >40 mg/dL 76  LDL (calc) Latest Ref Range: 0 - 99 mg/dL 78  Triglycerides Latest Ref Range: <150 mg/dL 44  VLDL Latest Ref Range: 0 - 40 mg/dL 9  Supportive measures with pain control, supplemental oxygen if needed. Continued ongoing IV fluid and electrolyte replacement. IV PPI therapy and antiemetics as needed.

## 2020-09-27 NOTE — Assessment & Plan Note (Signed)
Counseling when patient's acute issues are stable. Continue with nicotine patch 21 mg.

## 2020-09-27 NOTE — Assessment & Plan Note (Signed)
CIWA assessment protocol in place. Seizure and aspiration prophylaxis. Serum alcohol level pending. Close monitoring of electrolytes especially magnesium. Thiamine supplementation to prevent Warnicke's encephalopathy. Patient changed to Librium 4 times daily for the next 48 hours.

## 2020-09-27 NOTE — Progress Notes (Signed)
CIWA scores today this shift are 19 at 0900 and 21 at 1400. I have paged dr patel to the room in the AM to ask for ativan coverage according to CIWA scale.  The previous orders were there to give ativan, but were dc'd today by dr patel. I have continued to give every medicine I can that has been ordered to manage blood pressure and heart rate and anxiety level, I have paged dr patel again 1400 to ask for proper ativan amounts according to [facility] hospital policy.

## 2020-09-27 NOTE — Progress Notes (Signed)
9 ml of morphine from PCA pump wasted in stericycle with Posey Boyer, RN.

## 2020-09-27 NOTE — Progress Notes (Signed)
Critical lab value lactic acid resulting at 2.2 reported to me by charlie/lab---sent page to Dr. Allena Katz

## 2020-09-27 NOTE — Progress Notes (Signed)
   09/26/20 2107  Assess: MEWS Score  Temp 99.1 F (37.3 C)  BP (!) 156/108  Pulse Rate (!) 120  Resp 18  SpO2 97 %  Assess: MEWS Score  MEWS Temp 0  MEWS Systolic 0  MEWS Pulse 2  MEWS RR 0  MEWS LOC 0  MEWS Score 2  MEWS Score Color Yellow  Assess: if the MEWS score is Yellow or Red  Were vital signs taken at a resting state? Yes  Focused Assessment Change from prior assessment (see assessment flowsheet)  Early Detection of Sepsis Score *See Row Information* Low  MEWS guidelines implemented *See Row Information* Yes  Treat  MEWS Interventions Escalated (See documentation below)  Pain Scale 0-10  Pain Score 4  Escalate  MEWS: Escalate Yellow: discuss with charge nurse/RN and consider discussing with provider and RRT  Notify: Charge Nurse/RN  Name of Charge Nurse/RN Notified Staley Lunz  Date Charge Nurse/RN Notified 09/26/20  Time Charge Nurse/RN Notified 2107  Notify: Provider  Provider Name/Title DR Opyd  Date Provider Notified 09/26/20  Time Provider Notified 2110  Notification Type Page  Notification Reason Change in status  Response See new orders  Date of Provider Response 09/26/20  Time of Provider Response 2120  Document  Patient Outcome Not stable and remains on department   DR Oypd came up to see pt face to face and wrote orders on floor.

## 2020-09-27 NOTE — Assessment & Plan Note (Deleted)
Patient meets SIRS criteria on admission due to leukocytosis, tachycardia. We will obtain an echocardiogram and correct electrolytes and monitor. Serum lactic acid is however pending, and we will also obtain a procalcitonin.

## 2020-09-27 NOTE — Assessment & Plan Note (Signed)
Blood pressure (!) 135/92, pulse (!) 145, temperature 99 F (37.2 C), temperature source Oral, resp. rate (!) 51, height 6\' 5"  (1.956 m), weight 80.1 kg, SpO2 95 %. Pt meets severe sepsis criteria due to leucocytosis/ tachycardia and lactic acidosis.  Liver function is improving, kidney function is stable. Due to elevated procalcitonin we have started empiric iv abx with zosyn. I suspect pt will need repeat ct abd and pelvis with contrast if clinically he declines in anyway.

## 2020-09-27 NOTE — Progress Notes (Signed)
Called 2C to give report,RN receiving unit will call back for report.

## 2020-09-27 NOTE — Significant Event (Addendum)
Rapid Response Event Note   Reason for Call :  Asked to evaluate pt for appropriateness to move to PCU.   Pt came to ED 11/19 with abd pain. He was dx with pancreatitis. He drinks 8 + beers/day.   He is now going through withdrawals. His CIWAs have been >8 since admission.   Initial Focused Assessment:  Pt sitting on side of bed pulling at EKG leads. He is tachypneic but not labored. He says he is in [place] and the year is 1. He does know his name and is able to follow simple commands and move all extremities. He denies SOB/chest pain/headache. He does say his ABD hurts a little. Lungs are clear and diminished. Skin cool to touch, dry.   T-99, HR-155, BP-170/123, RR-40, SpO2-99% on 2L [place].  CIWA is 22-pt has been given 5 mg ativan since 2216    Interventions:  4mg  ativan given IV for CIWA-22 10mg  labetolol IV for DBP-100, HR-150s>DBP now down to 97, HR-130s Tx to [facility] PCU CIWA protocol with ativan q1h  Plan of Care:  Tx to [facility] for closer monitoring. CIWA q1h with ativan administration based on CIWA score.  May need to consult PCCM if unable to get CIWA score under control with q1h ativan administration. Please call RRT if further assistance needed.    Event Summary:   MD Notified: Dr. [facility] at bedside on RRT arrival Call Time:0223 Arrival [facility] End Time:0330  Antionette Char, RN

## 2020-09-27 NOTE — Progress Notes (Signed)
Pt explained that he will be transferred to another unit to get monitored closely pt  transferred from 5M17, to 2C by RN and rapid response, at 0315 via stretcher pt on ciwa protocol and needs higher level of care,receiving nurse Tanya RN giving pt"s belongings pt stable during transfer and arrival to [facility] .

## 2020-09-27 NOTE — Plan of Care (Signed)

## 2020-09-27 NOTE — Progress Notes (Addendum)
Carlos [name] 1:15 PM  Subjective: Patient seen and examined and case discussed with my partner Dr. Bosie Clos in his hospital computer chart reviewed and case discussed with his wife as well and currently patient is in DTs and is not able to answer questions and is being restrained and has no abdominal pain or nausea vomiting and we answered all the wife's questions  Objective: Vital signs stable afebrile abdomen is a little distended nontender soft labs reviewed increased white count BUN and creatinine okay lipase and liver test decreased bilirubin slight increased  Assessment: Alcoholic pancreatitis with DTs  Plan: Please call me this weekend if I can be of any further assistance otherwise will ask rounding team to see next week and consider a MRCP and MRI of the pancreas when follow-up imaging is needed otherwise nutritional and hydration will support per ICU team and the wife and I discussed sobriety in the future and the pancreas  [facility]  office [phone] After 5PM or if no answer call [phone]

## 2020-09-27 NOTE — Assessment & Plan Note (Signed)
Mild transaminitis attribute to alcohol abuse and fatty liver, AST has gone down from 150-71, ALT has gone down from 10 8-51. Avoid hepatotoxic agents. Liver ultrasound obtained shows hepatic steatosis.

## 2020-09-27 NOTE — Assessment & Plan Note (Addendum)
Obtain A1c suspect diabetes mellitus insulin requiring secondary to pancreatic insufficiency questionable history of chronic pancreatitis. Greatly appreciate GI consult, management and care.

## 2020-09-27 NOTE — Progress Notes (Signed)
PROGRESS NOTE    Slayde Brault  WYO:[id number] DOB: February 10, 1986 DOA: 09/26/2020  PCP: Sharon Seller, NP    Brief Narrative:  Trayshawn Durkin is a 34 y.o. male with no significant medical history presenting with abdominal pain.  He reports that he developed severe chest pain about 9pm yesterday.  He is actually pointing to the LUQ.  The pain woke him up from sleep.  No fever.  Nothing seems to make it better or worse.  He had a similar episode about 6 months ago but did not require hospitalization.  He drinks about 8 beers daily.  His last day of not drinking alcohol is unknown.  Denies h/o ETOH withdrawal. Denies drug use.  He does still have a gallbladder.  He takes only vitamins regularly.   ED Course:  Pancreatitis - alcoholic.  Drinks 8+ beers/day.  Started LUQ pain with radiation to the back last night.  Lipase 2503.  Elevated LFTs but not bili.  CT done with possible ileus.  Pain not controlled in ER - may need a PCA.   Assessment & Plan: Principal Problem:   Acute alcoholic pancreatitis Active Problems:   Alcohol dependence syndrome (HCC)   Tobacco dependence   Hyperglycemia   Hyponatremia   Transaminitis   Severe sepsis (HCC) Digestive * Acute alcoholic pancreatitis Assessment & Plan Blood pressure (!) 132/99, pulse (!) 133, temperature 99 F (37.2 C), temperature source Oral, resp. rate (!) 43, height 6\' 5"  (1.956 m), weight 80.1 kg, SpO2 95 %. Patient afebrile overnight, repeat lipase is pending. Lipid panel reviewed Results for LARK, RUNK (MRN [name]) as of 09/27/2020 07:59  Ref. Range 09/26/2020 12:11  Total CHOL/HDL Ratio Latest Units: RATIO 2.1  Cholesterol Latest Ref Range: 0 - 200 mg/dL 09/28/2020  HDL Cholesterol Latest Ref Range: >40 mg/dL 76  LDL (calc) Latest Ref Range: 0 - 99 mg/dL 78  Triglycerides Latest Ref Range: <150 mg/dL 44  VLDL Latest Ref Range: 0 - 40 mg/dL 9  Supportive measures with pain control, supplemental oxygen if needed. Continued ongoing IV  fluid and electrolyte replacement. IV PPI therapy and antiemetics as needed.   Other Severe sepsis (HCC) Assessment & Plan Blood pressure (!) 135/92, pulse (!) 145, temperature 99 F (37.2 C), temperature source Oral, resp. rate (!) 51, height 6\' 5"  (1.956 m), weight 80.1 kg, SpO2 95 %. Pt meets severe sepsis criteria due to leucocytosis/ tachycardia and lactic acidosis.  Liver function is improving, kidney function is stable. Due to elevated procalcitonin we have started empiric iv abx with zosyn. I suspect pt will need repeat ct abd and pelvis with contrast if clinically he declines in anyway.   Transaminitis Assessment & Plan Mild transaminitis attribute to alcohol abuse and fatty liver, AST has gone down from 150-71, ALT has gone down from 10 8-51. Avoid hepatotoxic agents. Liver ultrasound obtained shows hepatic steatosis.  Hyponatremia Assessment & Plan Suspect hyponatremia secondary to hyperglycemia in addition to dehydration from alcohol abuse. We will continue to follow sodium and other electrolytes and monitor and replace.  Hyperglycemia Assessment & Plan Obtain A1c suspect diabetes mellitus insulin requiring secondary to pancreatic insufficiency questionable history of chronic pancreatitis. Greatly appreciate GI consult, management and care.  Tobacco dependence Assessment & Plan Counseling when patient's acute issues are stable. Continue with nicotine patch 21 mg.  Alcohol dependence syndrome [facility]) Assessment & Plan CIWA assessment protocol in place. Seizure and aspiration prophylaxis. Serum alcohol level pending. Close monitoring of electrolytes especially magnesium. Thiamine supplementation to prevent  Warnicke's encephalopathy. Patient changed to Librium 4 times daily for the next 48 hours.     DVT prophylaxis:  lovenox  Disposition Plan: Home  Consultants:  GI  Procedures:  none  Subjective/Overview: Pt admitted yesterday with abdominal pain.  Attributed to acute alcoholic pancreatitis. CT of the abdomen and pelvis with contrast showedSevere interstitial edematous pancreatitis with early acute pancreatic collections. 2. Diminished enhancement throughout the gland not definitive at this time for signs of necrosis, perhaps due to severe pancreatic edema. Consider close follow-up based on clinical parameters to exclude the possibility of developing glandular necrosis. 3. Signs of ascites. 4. Small hiatal hernia and mild circumferential distal esophageal thickening. Correlate with any clinical signs of esophagitis. 5. Probable hepatic steatosis. We will repeat CT of the abdomen once more prior to discharge depending on clinical findings and exam. On exam pt is somnolent and agitated and wife at bedside. D/w her plan  Of care.    Objective: Vitals:   09/27/20 0800 09/27/20 1058 09/27/20 1117 09/27/20 1230  BP: (!) 135/92 (!) 137/109 (!) 152/99   Pulse: (!) 145  (!) 153   Resp: (!) 51  (!) 51   Temp:   (!) 100.9 F (38.3 C) 100 F (37.8 C)  TempSrc:   Axillary Oral  SpO2: 95%  94%   Weight:      Height:       SpO2: 94 % O2 Flow Rate (L/min): 2 L/min FiO2 (%): 34 %  Intake/Output Summary (Last 24 hours) at 09/27/2020 1336 Last data filed at 09/27/2020 0800 Gross per 24 hour  Intake 1920.66 ml  Output 0 ml  Net 1920.66 ml   Filed Weights   09/26/20 1131  Weight: 80.1 kg    Examination: Blood pressure (!) 152/99, pulse (!) 153, temperature 100 F (37.8 C), temperature source Oral, resp. rate (!) 51, height 6\' 5"  (1.956 m), weight 80.1 kg, SpO2 94 %. General exam: Appears calm and comfortable  HEENT:EOMI, perrl  Respiratory system: Clear to auscultation. Respiratory effort normal. Cardiovascular system: S1 & S2 heard, RRR. No JVD, murmurs, rubs, gallops or clicks. No pedal edema. Gastrointestinal system: Abdomen is nondistended, soft and nontender. No organomegaly or masses felt. Normal bowel sounds heard. Central nervous  system: Alert and oriented.CN grossly intact No focal neurological deficits. Extremities: pt moving all 4 ext and ambulating. Skin: No rashes, lesions or ulcers Psychiatry: Judgement and insight appear normal. Mood & affect appropriate.    Data Reviewed: I have personally reviewed following labs and imaging studies  Labs  Recent Labs    09/27/20 1032  CKTOTAL 190   Lab Results  Component Value Date   WBC 16.3 (H) 09/27/2020   HGB 16.4 09/27/2020   HCT 48.2 09/27/2020   MCV 107.1 (H) 09/27/2020   PLT 116 (L) 09/27/2020    Recent Labs  Lab 09/27/20 0259 09/27/20 0259 09/27/20 1232  NA 133*   < > 131*  K 3.9   < > 4.6  CL 96*   < > 99  CO2 25   < > 23  BUN 6   < > 8  CREATININE 1.06   < > 1.20  CALCIUM 8.1*   < > 8.2*  PROT 6.2*  --   --   BILITOT 1.5*  --   --   ALKPHOS 66  --   --   ALT 59*  --   --   AST 71*  --   --   GLUCOSE 175*   < >  227*   < > = values in this interval not displayed.   Lab Results  Component Value Date   CHOL 163 09/26/2020   HDL 76 09/26/2020   LDLCALC 78 09/26/2020   TRIG 44 09/26/2020   No results found for: DDIMER Invalid input(s): POCBNP  echocardiogram   Radiology Studies: CT ABDOMEN PELVIS W CONTRAST  Result Date: 09/26/2020 CLINICAL DATA:  Nausea vomiting and epigastric pain since 9 p.m. last night. EXAM: CT ABDOMEN AND PELVIS WITH CONTRAST TECHNIQUE: Multidetector CT imaging of the abdomen and pelvis was performed using the standard protocol following bolus administration of intravenous contrast. CONTRAST:  53mL OMNIPAQUE IOHEXOL 300 MG/ML  SOLN COMPARISON:  None. FINDINGS: Lower chest: Basilar atelectasis.  No effusion.  No consolidation. Hepatobiliary: No focal, suspicious hepatic lesion. Portal vein is patent. No pericholecystic stranding. No gross biliary duct dilation. Splenic vein also patent. Pancreas: Severe interstitial edematous pancreatitis with peripancreatic fluid tracking into the transverse mesocolon and  anterior pararenal space., early acute pancreatic fluid collections. Enhancement of the gland is diminished diffusely node not definite this time for pancreatic necrosis. Spleen: Normal Adrenals/Urinary Tract: Adrenal glands are normal. Symmetric renal enhancement.  Urinary bladder is unremarkable. Stomach/Bowel: Stomach with signs of mild edema due to adjacent inflammation. Small hiatal hernia and mild circumferential distal esophageal thickening. Small bowel is of normal caliber. The appendix is normal. Mild colonic edema in the setting of acute pancreatitis. Vascular/Lymphatic: Patent splenic and portal vein as described. Patent SMV. Normal caliber abdominal aorta. There is no gastrohepatic or hepatoduodenal ligament lymphadenopathy. No retroperitoneal or mesenteric lymphadenopathy. Reproductive: Prostate unremarkable by CT. No pelvic lymphadenopathy Other: Signs of ascites.  No free air. Musculoskeletal: No acute bone finding. No destructive bone process. IMPRESSION: 1. Severe interstitial edematous pancreatitis with early acute pancreatic collections. 2. Diminished enhancement throughout the gland not definitive at this time for signs of necrosis, perhaps due to severe pancreatic edema. Consider close follow-up based on clinical parameters to exclude the possibility of developing glandular necrosis. 3. Signs of ascites. 4. Small hiatal hernia and mild circumferential distal esophageal thickening. Correlate with any clinical signs of esophagitis. 5. Probable hepatic steatosis. Electronically Signed   By: Donzetta Kohut M.D.   On: 09/26/2020 10:14   DG CHEST PORT 1 VIEW  Result Date: 09/27/2020 CLINICAL DATA:  Initial evaluation for acute respiratory distress. EXAM: PORTABLE CHEST 1 VIEW COMPARISON:  Prior radiograph from 09/26/2020. FINDINGS: Cardiac and mediastinal silhouette stable, and remain within normal limits. Lungs are hypoinflated with secondary mild bibasilar subsegmental atelectasis. No focal  infiltrates. No edema or effusion. No pneumothorax. Osseous structures unchanged. IMPRESSION: 1. Low lung volumes with secondary mild bibasilar subsegmental atelectasis. 2. No other active cardiopulmonary disease. Electronically Signed   By: Rise Mu M.D.   On: 09/27/2020 03:37   DG ABD ACUTE 2+V W 1V CHEST  Result Date: 09/26/2020 CLINICAL DATA:  Abdominal pain, chest pain and abdominal pain, anteromedial chest pain in a 34 year old EXAM: DG ABDOMEN ACUTE WITH 1 VIEW CHEST COMPARISON:  None FINDINGS: Trachea midline. Cardiomediastinal contours and hilar structures are normal. Lungs are clear. No sign of effusion. No free air beneath either RIGHT or LEFT hemidiaphragm. Mildly dilated small bowel loops in the LEFT mid central abdomen. Stool and gas in the colon without definite rectal gas. No abnormal calcifications.  No organomegaly. On limited assessment no acute skeletal process. IMPRESSION: 1. No acute cardiopulmonary disease. 2. Mildly dilated small bowel loops in the mid abdomen on the LEFT may represent ileus or  early partial small bowel obstruction. Electronically Signed   By: Donzetta KohutGeoffrey  Wile M.D.   On: 09/26/2020 09:04   US Abdomen Limited RUQ (LIVER/GB)  Result Date: 09/26/2020 CLINICAL DATA:  34 year old male with acute pancreatitis. EXAM: ULTRASOUND ABDOMEN LIMITED RIGHT UPPER QUADRANT COMPARISON:  CT Abdomen and Pelvis 0939 hours today. FINDINGS: Gallbladder: No gallstones or wall thickening visualized. No sonographic Murphy sign noted by sonographer. Common bile duct: Diameter: 2 mm, normal. Liver: Echogenic liver (image 30). Trace perihepatic free fluid (image 16). No discrete liver lesion. No intrahepatic ductal dilatation. Portal vein is patent on color Doppler imaging with normal direction of blood flow towards the liver. Other: Negative visible right kidney. IMPRESSION: 1. Trace free fluid.  Hepatic steatosis. 2. Otherwise negative right upper quadrant Electronically Signed    By: Odessa FlemingH  Hall M.D.   On: 09/26/2020 16:02     Anti-infectives (From admission, onward)   Start     Dose/Rate Route Frequency Ordered Stop   09/27/20 1200  piperacillin-tazobactam (ZOSYN) IVPB 3.375 g  Status:  Discontinued        3.375 g 100 mL/hr over 30 Minutes Intravenous Every 6 hours 09/27/20 0934 09/27/20 0940   09/27/20 1030  piperacillin-tazobactam (ZOSYN) IVPB 3.375 g        3.375 g 12.5 mL/hr over 240 Minutes Intravenous Every 8 hours 09/27/20 0940         Continuous Infusions:  sodium chloride 125 mL/hr at 09/27/20 0936   piperacillin-tazobactam (ZOSYN)  IV 3.375 g (09/27/20 1137)   sodium chloride       LOS: 1 day    Gertha CalkinEkta V Jamirah Zelaya, MD [facility] Pager [phone] How to contact the [facility] Attending or Consulting provider 7A - 7P or covering provider during after hours 7P -7A, for this patient?    1. Check the care team in [facility] and look for a) attending/consulting [facility] provider listed and b) the [facility] team listed 2. Log into [email or web address] and use [place]'s universal password to access. If you do not have the password, please contact the hospital operator. 3. Locate the [facility] provider you are looking for under [facility] and page to a number that you can be directly reached. 4. If you still have difficulty reaching the provider, please page the [facility] (Director on Call) for the Hospitalists listed on amion for assistance. [email or web address] Password [email or web address] 09/27/2020, 1:36 PM

## 2020-09-27 NOTE — Assessment & Plan Note (Signed)
Suspect hyponatremia secondary to hyperglycemia in addition to dehydration from alcohol abuse. We will continue to follow sodium and other electrolytes and monitor and replace.

## 2020-09-27 NOTE — Significant Event (Signed)
Rapid Response Event Note   Reason for Call :  Elevated CIWA scores Initial Focused Assessment:  Called by the RN stating that the patient has been having high CIWA scores.  She was also concerned because the stepdown CIWA alcohol withdrawal protocol is not in place for this patient.  She states that this patient has constantly been trying to get out of bed, confused, agitated, and is showing systemic signs of withdrawal.  This patient has had CIWA scores ranging from 19-21, and in my opinion needs more lorazepam to safely treat his WD symptoms.       Interventions:  CIWA performed  Plan of Care:  Patient will remain on unit    Event Summary:   MD Notified: Allena Katz  Call Time: 1500 Arrival Time: 1503 End Time: 1540  Andrey Spearman, RN

## 2020-09-27 NOTE — Significant Event (Addendum)
Patient in acute severe EtOH withdrawal with CIWA >20. Will discontinue Librium and resume IV Ativan per CIWA scoring.

## 2020-09-28 ENCOUNTER — Inpatient Hospital Stay (HOSPITAL_COMMUNITY): Payer: Medicaid Other

## 2020-09-28 DIAGNOSIS — G9341 Metabolic encephalopathy: Secondary | ICD-10-CM

## 2020-09-28 DIAGNOSIS — K8521 Alcohol induced acute pancreatitis with uninfected necrosis: Secondary | ICD-10-CM

## 2020-09-28 DIAGNOSIS — R000 Tachycardia, unspecified: Secondary | ICD-10-CM

## 2020-09-28 DIAGNOSIS — J9601 Acute respiratory failure with hypoxia: Secondary | ICD-10-CM

## 2020-09-28 DIAGNOSIS — K8591 Acute pancreatitis with uninfected necrosis, unspecified: Secondary | ICD-10-CM

## 2020-09-28 DIAGNOSIS — F10231 Alcohol dependence with withdrawal delirium: Secondary | ICD-10-CM

## 2020-09-28 DIAGNOSIS — I361 Nonrheumatic tricuspid (valve) insufficiency: Secondary | ICD-10-CM

## 2020-09-28 LAB — URINE CULTURE

## 2020-09-28 LAB — URINALYSIS, ROUTINE W REFLEX MICROSCOPIC
Bacteria, UA: NONE SEEN
Bilirubin Urine: NEGATIVE
Glucose, UA: >=500 mg/dL — AB
Ketones, ur: 20 mg/dL — AB
Leukocytes,Ua: NEGATIVE
Nitrite: NEGATIVE
Protein, ur: 100 mg/dL — AB
Specific Gravity, Urine: 1.032 — ABNORMAL HIGH (ref 1.005–1.030)
pH: 5 (ref 5.0–8.0)

## 2020-09-28 LAB — CBC
HCT: 45.4 % (ref 39.0–52.0)
Hemoglobin: 15.2 g/dL (ref 13.0–17.0)
MCH: 35.8 pg — ABNORMAL HIGH (ref 26.0–34.0)
MCHC: 33.5 g/dL (ref 30.0–36.0)
MCV: 106.8 fL — ABNORMAL HIGH (ref 80.0–100.0)
Platelets: 97 10*3/uL — ABNORMAL LOW (ref 150–400)
RBC: 4.25 MIL/uL (ref 4.22–5.81)
RDW: 12.2 % (ref 11.5–15.5)
WBC: 15.5 10*3/uL — ABNORMAL HIGH (ref 4.0–10.5)
nRBC: 0 % (ref 0.0–0.2)

## 2020-09-28 LAB — BILIRUBIN, FRACTIONATED(TOT/DIR/INDIR)
Bilirubin, Direct: 1.5 mg/dL — ABNORMAL HIGH (ref 0.0–0.2)
Indirect Bilirubin: 1.8 mg/dL — ABNORMAL HIGH (ref 0.3–0.9)
Total Bilirubin: 3.3 mg/dL — ABNORMAL HIGH (ref 0.3–1.2)

## 2020-09-28 LAB — BLOOD GAS, ARTERIAL
Acid-base deficit: 2 mmol/L (ref 0.0–2.0)
Bicarbonate: 21.5 mmol/L (ref 20.0–28.0)
Drawn by: 24686
FIO2: 36
O2 Saturation: 94.9 %
Patient temperature: 37
pCO2 arterial: 32 mmHg (ref 32.0–48.0)
pH, Arterial: 7.442 (ref 7.350–7.450)
pO2, Arterial: 74.2 mmHg — ABNORMAL LOW (ref 83.0–108.0)

## 2020-09-28 LAB — MAGNESIUM
Magnesium: 2.1 mg/dL (ref 1.7–2.4)
Magnesium: 2.1 mg/dL (ref 1.7–2.4)

## 2020-09-28 LAB — COMPREHENSIVE METABOLIC PANEL
ALT: 35 U/L (ref 0–44)
AST: 46 U/L — ABNORMAL HIGH (ref 15–41)
Albumin: 2.5 g/dL — ABNORMAL LOW (ref 3.5–5.0)
Alkaline Phosphatase: 63 U/L (ref 38–126)
Anion gap: 12 (ref 5–15)
BUN: 16 mg/dL (ref 6–20)
CO2: 21 mmol/L — ABNORMAL LOW (ref 22–32)
Calcium: 8.2 mg/dL — ABNORMAL LOW (ref 8.9–10.3)
Chloride: 100 mmol/L (ref 98–111)
Creatinine, Ser: 1.33 mg/dL — ABNORMAL HIGH (ref 0.61–1.24)
GFR, Estimated: >60 mL/min (ref >60)
Glucose, Bld: 239 mg/dL — ABNORMAL HIGH (ref 70–99)
Potassium: 3.9 mmol/L (ref 3.5–5.1)
Sodium: 133 mmol/L — ABNORMAL LOW (ref 135–145)
Total Bilirubin: 4.1 mg/dL — ABNORMAL HIGH (ref 0.3–1.2)
Total Protein: 6.1 g/dL — ABNORMAL LOW (ref 6.5–8.1)

## 2020-09-28 LAB — LACTIC ACID, PLASMA
Lactic Acid, Venous: 2.7 mmol/L (ref 0.5–1.9)
Lactic Acid, Venous: 2.2 mmol/L (ref 0.5–1.9)

## 2020-09-28 LAB — ECHOCARDIOGRAM COMPLETE
Area-P 1/2: 7.44 cm2
Calc EF: 61.7 %
Height: 77 in
S' Lateral: 3.2 cm
Single Plane A2C EF: 67 %
Single Plane A4C EF: 56.6 %
Weight: 2824 oz

## 2020-09-28 LAB — PHOSPHORUS: Phosphorus: 2.2 mg/dL — ABNORMAL LOW (ref 2.5–4.6)

## 2020-09-28 LAB — SODIUM, URINE, RANDOM: Sodium, Ur: 48 mmol/L

## 2020-09-28 LAB — GLUCOSE, CAPILLARY
Glucose-Capillary: 199 mg/dL — ABNORMAL HIGH (ref 70–99)
Glucose-Capillary: 150 mg/dL — ABNORMAL HIGH (ref 70–99)
Glucose-Capillary: 145 mg/dL — ABNORMAL HIGH (ref 70–99)

## 2020-09-28 LAB — CREATININE, URINE, RANDOM: Creatinine, Urine: 123.64 mg/dL

## 2020-09-28 LAB — PROCALCITONIN: Procalcitonin: 11.09 ng/mL

## 2020-09-28 MED ORDER — HYDROMORPHONE HCL 1 MG/ML IJ SOLN
1.0000 mg | INTRAMUSCULAR | Status: DC | PRN
  Administered 2020-09-28 – 2020-09-29 (×4): 1 mg via INTRAVENOUS
  Filled 2020-09-28 (×4): qty 1

## 2020-09-28 MED ORDER — DEXMEDETOMIDINE HCL IN NACL 400 MCG/100ML IV SOLN
0.2000 ug/kg/h | INTRAVENOUS | Status: DC
  Administered 2020-09-28 – 2020-09-29 (×2): 0.2 ug/kg/h via INTRAVENOUS
  Administered 2020-09-30 (×2): 0.3 ug/kg/h via INTRAVENOUS
  Filled 2020-09-28 (×6): qty 100

## 2020-09-28 MED ORDER — LACTATED RINGERS IV SOLN: INTRAVENOUS | Status: DC

## 2020-09-28 MED ORDER — IOHEXOL 9 MG/ML PO SOLN
500.0000 mL | ORAL | Status: AC
  Administered 2020-09-28: 500 mL via ORAL

## 2020-09-28 MED ORDER — THIAMINE HCL 100 MG/ML IJ SOLN: 100.0000 mg | Freq: Every day | INTRAMUSCULAR | Status: DC

## 2020-09-28 MED ORDER — SODIUM CHLORIDE 0.9 % IV SOLN
1.0000 g | Freq: Three times a day (TID) | INTRAVENOUS | Status: DC
  Filled 2020-09-28 (×3): qty 1

## 2020-09-28 MED ORDER — SODIUM CHLORIDE 0.9 % IV BOLUS
1000.0000 mL | Freq: Once | INTRAVENOUS | Status: AC
  Administered 2020-09-28: 1000 mL via INTRAVENOUS

## 2020-09-28 MED ORDER — SODIUM CHLORIDE 0.9 % IV SOLN: Freq: Once | INTRAVENOUS | Status: AC

## 2020-09-28 MED ORDER — IOHEXOL 300 MG/ML  SOLN
100.0000 mL | Freq: Once | INTRAMUSCULAR | Status: AC | PRN
  Administered 2020-09-28: 100 mL via INTRAVENOUS

## 2020-09-28 MED ORDER — INSULIN ASPART 100 UNIT/ML ~~LOC~~ SOLN
0.0000 [IU] | SUBCUTANEOUS | Status: DC
  Administered 2020-09-28 (×2): 2 [IU] via SUBCUTANEOUS
  Administered 2020-09-28: 1 [IU] via SUBCUTANEOUS
  Administered 2020-09-29: 2 [IU] via SUBCUTANEOUS
  Administered 2020-09-29 – 2020-09-30 (×3): 1 [IU] via SUBCUTANEOUS
  Administered 2020-09-30 (×2): 2 [IU] via SUBCUTANEOUS
  Administered 2020-09-30: 1 [IU] via SUBCUTANEOUS
  Administered 2020-09-30: 2 [IU] via SUBCUTANEOUS
  Administered 2020-10-01 (×2): 3 [IU] via SUBCUTANEOUS

## 2020-09-28 MED ORDER — LORAZEPAM 2 MG/ML IJ SOLN
2.0000 mg | INTRAMUSCULAR | Status: AC
  Administered 2020-09-28 (×2): 2 mg via INTRAVENOUS
  Filled 2020-09-28 (×2): qty 1

## 2020-09-28 MED ORDER — SODIUM CHLORIDE 0.9 % IV SOLN: INTRAVENOUS | Status: DC

## 2020-09-28 MED ORDER — CHLORHEXIDINE GLUCONATE CLOTH 2 % EX PADS
6.0000 | MEDICATED_PAD | Freq: Every day | CUTANEOUS | Status: DC
  Administered 2020-09-28 – 2020-10-01 (×4): 6 via TOPICAL

## 2020-09-28 MED ORDER — PIPERACILLIN-TAZOBACTAM 3.375 G IVPB
3.3750 g | Freq: Three times a day (TID) | INTRAVENOUS | Status: AC
  Administered 2020-09-28 – 2020-10-02 (×13): 3.375 g via INTRAVENOUS
  Filled 2020-09-28 (×14): qty 50

## 2020-09-28 MED ORDER — LINEZOLID 600 MG/300ML IV SOLN
600.0000 mg | Freq: Two times a day (BID) | INTRAVENOUS | Status: DC
  Administered 2020-09-28: 600 mg via INTRAVENOUS
  Filled 2020-09-28 (×2): qty 300

## 2020-09-28 NOTE — Consult Note (Addendum)
Renal Service Consult Note [facility]  Carlos [name] 09/28/2020 Carlos Blazing, MD Requesting Physician: Dr Carlos [name]  Reason for Consult: Acute renal failure HPI: The patient is a 34 y.o. year-old w/ no PMH presented to ED on 11/19 w/ abdominal pain. Drinks etoh about 8 beers per day. No drug use. In ED lipase was 2503, tbili wnl but AST/ ALT were up.  CT done x 2 showing severe pancreatitis. Kidneys appear normal on CT.  Got IV contrast x 2 here on 11/19 and on 11/21 today.  Creat up slightly to 1.33 today. Asked to see for renal failure.   I/O  = + 5 L , UOP 650 cc yest and 150 cc so far today  Using condom cath  BP's high 150 - 170, 90- 110 Has rec'd IV dilt, IV hydralazine and IV labetalol CXR today w/o edema  RR 40's   Pt won't give much history, appears in a lot of pain   CT today w/ contrast showed probable necrosis of part of the pancreas  Pt seen on SDU in room.  Not talking much, shallow breathing due to abd pain.  Not in distress really though.  No CP or SOB.     ROS  denies CP  no joint pain   no HA  no blurry vision  no rash  no diarrhea  no nausea/ vomiting    Past Medical History History reviewed. No pertinent past medical history. Past Surgical History History reviewed. No pertinent surgical history. Family History  Family History  Problem Relation Age of Onset  . Hypertension Mother   . Diabetes Maternal Grandmother   . Diabetes Maternal Grandfather    Social History  reports that he has been smoking cigars. He started smoking about 17 years ago. He has smoked for the past 15.00 years. He has never used smokeless tobacco. He reports current alcohol use. He reports that he does not use drugs. Allergies  Allergies  Allergen Reactions  . Other Anaphylaxis    boysenberry   Home medications Prior to Admission medications   Medication Sig Start Date End Date Taking? Authorizing Provider  Multiple Vitamins-Minerals (CENTRUM) tablet Take 1  tablet by mouth daily.   Yes [provider]     Vitals:   09/28/20 0600 09/28/20 0729 09/28/20 1055 09/28/20 1200  BP:  (!) 148/105 (!) 139/92 (!) 148/98  Pulse: (!) 137 (!) 140 (!) 130 (!) 120  Resp:  (!) 33 (!) 36 (!) 47  Temp:  98.6 F (37 C) 98.4 F (36.9 C) 98.6 F (37 C)  TempSrc:  Oral Oral Oral  SpO2:  95%    Weight:      Height:       Exam Gen alert, thin AAM, looks uncomfortable, not in distress, rapid shallow breaths No rash, cyanosis or gangrene Sclera anicteric, throat clear  No jvd or bruits Chest clear bilat to bases RRR no MRG, tachy Abd firm, tender, dec'd BS, scaphoid GU normal male w/ condom cath draining clear amber urine  MS no joint effusions or deformity Ext no leg or UE edema, no wounds or ulcers Neuro is alert, Ox 3 , nf    Home meds:  - none    CXR 11/21 - IMPRESSION: Low lung volumes. Small left-sided pleural effusion with increased airspace disease at the left lung base, atelectasis versus pneumonia.    CT abd 11/21 - Adrenals/Urinary Tract: Adrenal glands are unremarkable. Kidneys are normal, without renal calculi, focal  lesion, or hydronephrosis. Bladder is unremarkable.    UA 11/19 - prot 30, no rbc/ wbc   UA  11/21 - prot 100, 0-5 rbc/ wcb   UNa, UCr - pending    Renal US - pending        BP 130 - 150 / 90- 110    HR 120's - 130's   RR 33- 50 pm  Assessment/ Plan: 1. AKI - mild, creat 1.33, eGFR around 65- 80 cc/min, in setting of severe acute pancreatitis.  Has rec'd IV contrast twice on 11/19 and today 11/21.  UA is unremarkable.  CT abd shows normal appearing kidneys w/o hydro. Will get urine lytes.  UOP is okay, not great.  BP's are high not low.  Rapid breathing c/w SIRS response to panc inflammation. Getting 200 cc/hr IVF"s now, is 5.2 L + so far since admit on 11/19.  No edema on exam yet or CHF on CXR. Will need to lower IVF"s soon if UOP is not keeping up with input. Would try to avoid further IV contrast for this  patient if at all possible. Recommend place foley cath to get accurate I/O's in this tenuous situation. Will follow.  2. Severe acute etoh pancreatitis - 1st episode, no other PMH.  Per primary.  3. ETOH abuse - in withdrawal w/ confusion, per primary team       Carlos Splinter  MD 09/28/2020, 1:17 PM  Recent Labs  Lab 09/27/20 0259 09/28/20 0757  WBC 16.3* 15.5*  HGB 16.4 15.2   Recent Labs  Lab 09/27/20 1232 09/28/20 0757  K 4.6 3.9  BUN 8 16  CREATININE 1.20 1.33*  CALCIUM 8.2* 8.2*  PHOS 1.7* 2.2*

## 2020-09-28 NOTE — Consult Note (Addendum)
NAME:  Carlos [name], MRN:  [id number], DOB:  [birth year], LOS: 2 ADMISSION DATE:  09/26/2020, CONSULTATION DATE:  09/28/2020 REFERRING MD:  Dr. Allena Katz, CHIEF COMPLAINT:  Pancreatitis w/necrosis   Brief History   34 year old admitted with acute pancreatitis, presumably due to ETOH abuse.  Developed clinical worsening on 11/21 with high CIWA scores, persistent tachycardia, tachypnea, new AKI, elevating t. Bili, and repeat CT showing new changes consistent with necrotizing pancreatitis.  GI and CCS following.  PCCM consulted for further ICU care.   History of present illness    34 year old male with no significant past medical history other than tobacco abuse and daily ETOH use.  Wife reports chronic history of ETOH, however very heavy over the last year; last drink on 11/18.  He presented on 11/19 with LUQ pain and non-bloody emesis.  Workup notable for lipase 2503, AST 150, ALT 108, t.bili 1. CT abd/ pelvis showing severe interstitial edematous pancreatitis with early acute pancreatic collections.  Admitted to [facility] with acute pancreatitis.  GI following.  On 11/21, he developed clinical worsening, with high CIWA scores > 20 on ativan, persistent tachycardia, tachypnea, new AKI, elevating t. Bili, and repeat CT showing new changes consistent with necrotizing pancreatitis.  PCCM consulted for ICU transfer and further management of aggressive resuscitation.    Past Medical History  Tobacco abuse, ETOH use (reported 8+ beers and liquor/ daily) Otherwise no pertinent past medical history   Significant Hospital Events   11/19 Admitted [facility] 11/21 PCCM consulted, tx ICU  Consults:  GI- [facility] PCCM  Procedures:   Significant Diagnostic Tests:  11/19 CT abd/ pelvis >> 1. Severe interstitial edematous pancreatitis with early acute pancreatic collections. 2. Diminished enhancement throughout the gland not definitive at this time for signs of necrosis, perhaps due to severe pancreatic edema. Consider  close follow-up based on clinical parameters to exclude the possibility of developing glandular necrosis. 3. Signs of ascites. 4. Small hiatal hernia and mild circumferential distal esophageal thickening.  Correlate with any clinical signs of esophagitis. 5. Probable hepatic steatosis.   11/19 limited RUQ [place] >> 1. Trace free fluid.  Hepatic steatosis. 2. Otherwise negative right upper quadrant   11/21 TTE >> LVEF 60-65%, no wall abnormalities, mild LVH, normal RV   11/21 CT abd w/contrast >> 1. Redemonstrated extensive inflammatory stranding about the pancreas and adjacent structures, with an interval increase in retroperitoneal inflammatory fluid. 2. There is new hypoenhancement of the majority of the pancreatic body and tail, involving approximately the distal 9.2 cm of the pancreas. 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 3. There is a new fluid collection of the anterior pancreatic head measuring 3.2 x 2.9 cm, consistent with developing pseudocyst. 4. New small volume ascites. 5. New moderate bilateral pleural effusions and associated atelectasis or consolidation.  Micro Data:  11/19 SARS 2/ Flu >> neg 11/20 BCx 2 >> 11/20 UC >> multiple species   Antimicrobials:  11/20 zosyn >> 11/21 linezolid >>  Interim history/subjective:   Objective   Blood pressure (!) 149/95, pulse (!) 119, temperature 98.6 F (37 C), temperature source Oral, resp. rate (!) 52, height 6\' 5"  (1.956 m), weight 80.1 kg, SpO2 93 %.        Intake/Output Summary (Last 24 hours) at 09/28/2020 1428 Last data filed at 09/28/2020 0800 Gross per 24 hour  Intake 1657.73 ml  Output 800 ml  Net 857.73 ml   Filed Weights   09/26/20 1131  Weight: 80.1 kg  Examination: General:  Ill appearing adult male lying in bed  HEENT: MM pink/dry Neuro: Awake, answers simple questions, confused, f/c, MAE, fidgety  CV: ST, no murmur PULM:  Shallow/ tachypneic in the 40-50's, scattered rhonchi,  diminished in bases, currently on 4L [facility]: slight distention, tense, mild generalized tenderness, +bs Extremities: warm/dry, no LE edema  Skin: no rashes, multiple tattoos  Resolved Hospital Problem list    Assessment & Plan:   Necrotizing pancreatitis, 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  Trending LFTs/ bilirubin, if increasing will possibly need biliary decompression - continue abx for cholangitis ppx given elevated bili, per surgery - cortrak to be placed post duodenal to start enteric feedings - corrected calcium 9.4 - foley to be placed, continue fluid resuscitation for goal UOP > 0.5 ml/kg/hr - dilaudid prn for pain control  - trending CBC/ fever curve    Hypoxia secondary to bibasilar atelectasis Tobacco abuse - high risk for deterioration/ intubation  - continue supplemental O2 for sat goal > 92% - intermittent CXR - pulmonary hygiene as tolerated  - tobacco cessation when appropriate    Encephalopathy secondary to ETOH withdrawal  - no prior hx of DT's or seizures; last drink 11/18 - precedex once in ICU w/ scheduled ativan 2mg  q 4hr - empiric thiamine/ folic acid   - check ammonia level    AKI  - foley to be placed - fluid resuscitation for goal UOP UOP > 0.5 ml/kg/hr - trend renal indices    Thrombocytopenia  - trend CBC   Leukocytosis  - PCT 11, UA neg - follow culture data - continue empiric abx for now   Hyperglycemia, stress response - HA1c 4.9 - SSI sensitive/ CBG q 4  Best practice:  Diet: NPO; cortrak to be placed to start EN Pain/Anxiety/Delirium protocol (if indicated): precedex VAP protocol (if indicated): n/a DVT prophylaxis: lovenox GI prophylaxis: PPI BID Glucose control: SSI Mobility: BR Code Status: Full Family Communication: wife updated at bedside Disposition: tx to ICU   Labs   CBC: Recent Labs  Lab 09/26/20 0648  09/27/20 0259 09/28/20 0757  WBC 9.0 16.3* 15.5*  HGB 15.6 16.4 15.2  HCT 45.9 48.2 45.4  MCV 107.0* 107.1* 106.8*  PLT 160 116* 97*    Basic Metabolic Panel: Recent Labs  Lab 09/26/20 0648 09/27/20 0259 09/27/20 0818 09/27/20 1232 09/28/20 0058 09/28/20 0757  NA 139 133*  --  131*  --  133*  K 3.6 3.9  --  4.6  --  3.9  CL 103 96*  --  99  --  100  CO2 21* 25  --  23  --  21*  GLUCOSE 146* 175*  --  227*  --  239*  BUN 5* 6  --  8  --  16  CREATININE 1.07 1.06  --  1.20  --  1.33*  CALCIUM 9.2 8.1*  --  8.2*  --  8.2*  MG  --   --  1.0* 2.1 2.1 2.1  PHOS  --   --   --  1.7*  --  2.2*   GFR: Estimated Creatinine Clearance: 88.7 mL/min (A) (by C-G formula based on SCr of 1.33 mg/dL (H)). Recent Labs  Lab 09/26/20 0648 09/27/20 0259 09/27/20 0818 09/27/20 1032 09/28/20 0058 09/28/20 0757 09/28/20 0932 09/28/20 1309  PROCALCITON  --   --  3.36  --  11.09  --   --   --   WBC 9.0 16.3*  --   --   --  15.5*  --   --   LATICACIDVEN  --   --  2.2* 2.6*  --   --  2.7* 2.2*    Liver Function Tests: Recent Labs  Lab 09/26/20 0648 09/27/20 0259 09/28/20 0757 09/28/20 1309  AST 150* 71* 46*  --   ALT 108* 59* 35  --   ALKPHOS 77 66 63  --   BILITOT 1.0 1.5* 4.1* 3.3*  PROT 7.5 6.2* 6.1*  --   ALBUMIN 4.0 3.1* 2.5*  --    Recent Labs  Lab 09/26/20 0648 09/27/20 0818  LIPASE 2,503* 549*   No results for input(s): AMMONIA in the last 168 hours.  ABG    Component Value Date/Time   HCO3 25.4 09/27/2020 1340   O2SAT 43.5 09/27/2020 1340     Coagulation Profile: No results for input(s): INR, PROTIME in the last 168 hours.  Cardiac Enzymes: Recent Labs  Lab 09/27/20 1032  CKTOTAL 190    HbA1C: Hgb A1c MFr Bld  Date/Time Value Ref Range Status  09/27/2020 08:18 AM 4.9 4.8 - 5.6 % Final    Comment:    (NOTE) Pre diabetes:          5.7%-6.4%  Diabetes:              >6.4%  Glycemic control for   <7.0% adults with diabetes   09/26/2020 12:11 PM  4.9 4.8 - 5.6 % Final    Comment:    (NOTE) Pre diabetes:          5.7%-6.4%  Diabetes:              >6.4%  Glycemic control for   <7.0% adults with diabetes     CBG: No results for input(s): GLUCAP in the last 168 hours.  Review of Systems:   Unable given patient's confusion   Past Medical History  He,  has no past medical history on file.   Surgical History   History reviewed. No pertinent surgical history.   Social History   reports that he has been smoking cigars. He started smoking about 17 years ago. He has smoked for the past 15.00 years. He has never used smokeless tobacco. He reports current alcohol use. He reports that he does not use drugs.   Family History   His family history includes Diabetes in his maternal grandfather and maternal grandmother; Hypertension in his mother.   Allergies Allergies  Allergen Reactions  . Other Anaphylaxis    boysenberry     Home Medications  Prior to Admission medications   Medication Sig Start Date End Date Taking? Authorizing Provider  Multiple Vitamins-Minerals (CENTRUM) tablet Take 1 tablet by mouth daily.   Yes [provider]     Critical care time: 45 mins     Posey Boyer, ACNP [facility] 09/28/2020, 2:28 PM  See Amion for personal pager PCCM on call pager [phone]

## 2020-09-28 NOTE — CV Procedure (Signed)
2D echo attempted, but patient in CT. Will try later

## 2020-09-28 NOTE — Consult Note (Signed)
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 male who is admitted to the Internal Medicine service with severe acute pancreatitis, alcohol related. The patient had clinical worsening today as evidence by new AKI, tachycardia to the 140s, tachypnea to the 40s, decreased UOP, increased abdominal pain, newly elevated total bilirubin, and a CT scan that demonstrates new hypoperfusion of the body and tail of the pancreas with a fluid collection in the head of the pancreas. Due to the patient's clinical change, general surgery has been consulted for evaluation and treatment recommendations.   History reviewed. No pertinent past medical history.  History reviewed. No pertinent surgical history.  Family History  Problem Relation Age of Onset  . Hypertension Mother   . Diabetes Maternal Grandmother   . Diabetes Maternal Grandfather     Social:  reports that he has been smoking cigars. He started smoking about 17 years ago. He has smoked for the past 15.00 years. He has never used smokeless tobacco. He reports current alcohol use. He reports that he does not use drugs.  Allergies:  Allergies  Allergen Reactions  . Other Anaphylaxis    boysenberry    Medications: I have reviewed the patient's current medications.   ROS - all of the below systems have been reviewed with the patient and positives are indicated with bold text General: chills, fever or night sweats Eyes: blurry vision or double vision ENT: epistaxis or sore throat Allergy/Immunology: itchy/watery eyes or nasal congestion Hematologic/Lymphatic: bleeding problems, blood clots or swollen lymph nodes Endocrine: temperature intolerance or unexpected weight changes Resp: cough, shortness of breath, or wheezing, tachypnea CV: chest pain or dyspnea on exertion GI: as per HPI GU: dysuria, trouble voiding, or hematuria MSK: joint pain or joint  stiffness Neuro: TIA or stroke symptoms Derm: pruritus and skin lesion changes Psych: anxiety and depression  PE Blood pressure (!) 148/98, pulse (!) 120, temperature 98.6 F (37 C), temperature source Oral, resp. rate (!) 47, height  (1.956 m), weight 80.1 kg, SpO2 95 %. Constitutional: Moderate distress; conversant; no deformities, obvious pain Eyes: Moist conjunctiva; no lid lag; anicteric; PERRL Neck: Trachea midline; no thyromegaly Lungs: Increased respiratory effect with tachypnea into the 40s, some grunting with breathing; no tactile fremitus CV: Tachycardia, regular rhythm; no palpable thrills; no pitting edema GI: Abd diffusely tender with guarding, mildly distended; no palpable hepatosplenomegaly MSK: Could not assess gait 2/2 clinical condition; no clubbing/cyanosis Psychiatric: Depressed affect; alert and oriented x3 Lymphatic: No palpable cervical or axillary lymphadenopathy Skin: no rashes or lesions appreciated  Results for orders placed or performed during the hospital encounter of 09/26/20 (from the past 48 hour(s))  Comprehensive metabolic panel     Status: Abnormal   Collection Time: 09/27/20  2:59 AM  Result Value Ref Range   Sodium 133 (L) 135 - 145 mmol/L   Potassium 3.9 3.5 - 5.1 mmol/L   Chloride 96 (L) 98 - 111 mmol/L   CO2 25 22 - 32 mmol/L   Glucose, Bld 175 (H) 70 - 99 mg/dL    Comment: Glucose reference range applies only to samples taken after fasting for at least 8 hours.   BUN 6 6 - 20 mg/dL   Creatinine, Ser 1.61 0.61 - 1.24 mg/dL   Calcium 8.1 (L) 8.9 - 10.3 mg/dL   Total Protein 6.2 (L) 6.5 - 8.1 g/dL   Albumin 3.1 (L) 3.5 - 5.0 g/dL  AST 71 (H) 15 - 41 U/L   ALT 59 (H) 0 - 44 U/L   Alkaline Phosphatase 66 38 - 126 U/L   Total Bilirubin 1.5 (H) 0.3 - 1.2 mg/dL   GFR, Estimated >29 >56 mL/min    Comment: (NOTE) Calculated using the CKD-EPI Creatinine Equation (2021)    Anion gap 12 5 - 15    Comment: Performed at [facility]  Lab, [address] [place], [place] [address]  CBC     Status: Abnormal   Collection Time: 09/27/20  2:59 AM  Result Value Ref Range   WBC 16.3 (H) 4.0 - 10.5 K/uL   RBC 4.50 4.22 - 5.81 MIL/uL   Hemoglobin 16.4 13.0 - 17.0 g/dL   HCT 65.7 39 - 52 %   MCV 107.1 (H) 80.0 - 100.0 fL   MCH 36.4 (H) 26.0 - 34.0 pg   MCHC 34.0 30.0 - 36.0 g/dL   RDW 84.6 96.2 - 95.2 %   Platelets 116 (L) 150 - 400 K/uL    Comment: REPEATED TO VERIFY PLATELET COUNT CONFIRMED BY SMEAR Immature Platelet Fraction may be clinically indicated, consider ordering this additional test [id number]    nRBC 0.1 0.0 - 0.2 %    Comment: Performed at [facility], [address] [place], [place] [address]  Hemoglobin A1c     Status: None   Collection Time: 09/27/20  8:18 AM  Result Value Ref Range   Hgb A1c MFr Bld 4.9 4.8 - 5.6 %    Comment: (NOTE) Pre diabetes:          5.7%-6.4%  Diabetes:              >6.4%  Glycemic control for   <7.0% adults with diabetes    Mean Plasma Glucose 93.93 mg/dL    Comment: Performed at [facility] Lab, [address], [place], [place] [address]  Hepatitis panel, acute     Status: None   Collection Time: 09/27/20  8:18 AM  Result Value Ref Range   Hepatitis B Surface Ag NON REACTIVE NON REACTIVE   HCV Ab NON REACTIVE NON REACTIVE    Comment: (NOTE) Nonreactive HCV antibody screen is consistent with no HCV infections,  unless recent infection is suspected or other evidence exists to indicate HCV infection.     Hep A IgM NON REACTIVE NON REACTIVE   Hep B C IgM NON REACTIVE NON REACTIVE    Comment: Performed at [facility], [address], [place], [place] [address]  Lipase, blood     Status: Abnormal   Collection Time: 09/27/20  8:18 AM  Result Value Ref Range   Lipase 549 (H) 11 - 51 U/L    Comment: RESULTS CONFIRMED BY MANUAL DILUTION Performed at [facility] Lab, [address], [place], [place] [address]   Lactic acid, plasma     Status: Abnormal     Collection Time: 09/27/20  8:18 AM  Result Value Ref Range   Lactic Acid, Venous 2.2 (HH) 0.5 - 1.9 mmol/L    Comment: CRITICAL RESULT CALLED TO, READ BACK BY AND VERIFIED WITH: T.WILLIAMS RN @ [phone] 09/27/2020 BY C.EDENS Performed at [facility], [address], [place], [place] [address]   T4, free     Status: None   Collection Time: 09/27/20  8:18 AM  Result Value Ref Range   Free T4 0.93 0.61 - 1.12 ng/dL    Comment: (NOTE) Biotin  ingestion may interfere with free T4 tests. If the results are inconsistent with the TSH level, previous test results, or the clinical presentation, then consider biotin interference. If needed, order repeat testing after stopping biotin. Performed at [facility], [address] [place], [place] [address]   Ethanol     Status: None   Collection Time: 09/27/20  8:18 AM  Result Value Ref Range   Alcohol, Ethyl (B) <10 <10 mg/dL    Comment: (NOTE) Lowest detectable limit for serum alcohol is 10 mg/dL.  For medical purposes only. Performed at [facility] Lab, [address], [place], [place] [address]   Magnesium     Status: Abnormal   Collection Time: 09/27/20  8:18 AM  Result Value Ref Range   Magnesium 1.0 (L) 1.7 - 2.4 mg/dL    Comment: Performed at [facility] Lab, [address], [place], [place] [address]  Procalcitonin     Status: None   Collection Time: 09/27/20  8:18 AM  Result Value Ref Range   Procalcitonin 3.36 ng/mL    Comment:        Interpretation: PCT > 2 ng/mL: Systemic infection (sepsis) is likely, unless other causes are known. (NOTE)       Sepsis PCT Algorithm           Lower Respiratory Tract                                      Infection PCT Algorithm    ----------------------------     ----------------------------         PCT < 0.25 ng/mL                PCT < 0.10 ng/mL          Strongly encourage             Strongly discourage   discontinuation of antibiotics    initiation of antibiotics     ----------------------------     -----------------------------       PCT 0.25 - 0.50 ng/mL            PCT 0.10 - 0.25 ng/mL               OR       >80% decrease in PCT            Discourage initiation of                                            antibiotics      Encourage discontinuation           of antibiotics    ----------------------------     -----------------------------         PCT >= 0.50 ng/mL              PCT 0.26 - 0.50 ng/mL               AND       <80% decrease in PCT              Encourage initiation of  antibiotics       Encourage continuation           of antibiotics    ----------------------------     -----------------------------        PCT >= 0.50 ng/mL                  PCT > 0.50 ng/mL               AND         increase in PCT                  Strongly encourage                                      initiation of antibiotics    Strongly encourage escalation           of antibiotics                                     -----------------------------                                           PCT <= 0.25 ng/mL                                                 OR                                        > 80% decrease in PCT                                      Discontinue / Do not initiate                                             antibiotics  Performed at [facility] Lab, [address] [place], [place] [address]   Vitamin B12     Status: None   Collection Time: 09/27/20  8:18 AM  Result Value Ref Range   Vitamin B-12 434 180 - 914 pg/mL    Comment: (NOTE) This assay is not validated for testing neonatal or myeloproliferative syndrome specimens for Vitamin B12 levels. Performed at [facility] Lab, [address] [place], [place] [address]   Folate     Status: None   Collection Time: 09/27/20  8:18 AM  Result Value Ref Range   Folate 21.6 >5.9 ng/mL    Comment: Performed at [facility] Lab, [address], [place], [place] [address]  Urine Culture     Status: Abnormal   Collection Time: 09/27/20 10:01 AM   Specimen: Urine, Clean Catch  Result Value Ref Range   Specimen Description URINE, CLEAN CATCH    Special Requests      NONE Performed  at [facility] Lab, [address] [place], [place] [address]    Culture MULTIPLE SPECIES PRESENT, SUGGEST RECOLLECTION (A)    Report Status 09/28/2020 FINAL   Lactic acid, plasma     Status: Abnormal   Collection Time: 09/27/20 10:32 AM  Result Value Ref Range   Lactic Acid, Venous 2.6 (HH) 0.5 - 1.9 mmol/L    Comment: CRITICAL VALUE NOTED.  VALUE IS CONSISTENT WITH PREVIOUSLY REPORTED AND CALLED VALUE. Performed at [facility] Lab, [address] [place], [place] [address]   Brain natriuretic peptide     Status: None   Collection Time: 09/27/20 10:32 AM  Result Value Ref Range   B Natriuretic Peptide 77.3 0.0 - 100.0 pg/mL    Comment: Performed at [facility], [address] [place], [place] [address]  Troponin I (High Sensitivity)     Status: None   Collection Time: 09/27/20 10:32 AM  Result Value Ref Range   Troponin I (High Sensitivity) 13 <18 ng/L    Comment: (NOTE) Elevated high sensitivity troponin I (hsTnI) values and significant  changes across serial measurements may suggest ACS but many other  chronic and acute conditions are known to elevate hsTnI results.  Refer to the "Links" section for chest pain algorithms and additional  guidance. Performed at [facility] Lab, [address] [place], [place] [address]   CK     Status: None   Collection Time: 09/27/20 10:32 AM  Result Value Ref Range   Total CK 190 49.0 - 397.0 U/L    Comment: Performed at [facility], [address] [place], [place] [address]  Culture, blood (Routine X 2) w Reflex to ID Panel     Status: None (Preliminary result)   Collection Time: 09/27/20 10:36 AM   Specimen: BLOOD  Result Value Ref Range   Specimen Description BLOOD  RIGHT ANTECUBITAL    Special Requests      BOTTLES DRAWN AEROBIC AND ANAEROBIC Blood Culture adequate volume   Culture      NO GROWTH < 24 HOURS Performed at [facility], [address] [place], [place] [address]    Report Status PENDING   Culture, blood (Routine X 2) w Reflex to ID Panel     Status: None (Preliminary result)   Collection Time: 09/27/20 10:44 AM   Specimen: BLOOD RIGHT HAND  Result Value Ref Range   Specimen Description BLOOD RIGHT HAND    Special Requests      BOTTLES DRAWN AEROBIC AND ANAEROBIC Blood Culture adequate volume   Culture      NO GROWTH < 24 HOURS Performed at [facility], [address] [place], [place] [address]    Report Status PENDING   Basic metabolic panel     Status: Abnormal   Collection Time: 09/27/20 12:32 PM  Result Value Ref Range   Sodium 131 (L) 135 - 145 mmol/L   Potassium 4.6 3.5 - 5.1 mmol/L   Chloride 99 98 - 111 mmol/L   CO2 23 22 - 32 mmol/L   Glucose, Bld 227 (H) 70 - 99 mg/dL    Comment: Glucose reference range applies only to samples taken after fasting for at least 8 hours.   BUN 8 6 - 20 mg/dL   Creatinine, Ser 6.55 0.61 - 1.24 mg/dL   Calcium 8.2 (L) 8.9 - 10.3 mg/dL   GFR, Estimated >37 >48 mL/min    Comment: (NOTE) Calculated using  the CKD-EPI Creatinine Equation (2021)    Anion gap 9 5 - 15    Comment: Performed at [facility] Lab, [address] [place], [place] [address]  Magnesium     Status: None   Collection Time: 09/27/20 12:32 PM  Result Value Ref Range   Magnesium 2.1 1.7 - 2.4 mg/dL    Comment: Performed at [facility], [address] [place], [place] [address]  Phosphorus     Status: Abnormal   Collection Time: 09/27/20 12:32 PM  Result Value Ref Range   Phosphorus 1.7 (L) 2.5 - 4.6 mg/dL    Comment: Performed at [facility] Lab, [address], [place], [place] [address]  Troponin I (High Sensitivity)     Status: Abnormal   Collection Time: 09/27/20 12:32 PM  Result  Value Ref Range   Troponin I (High Sensitivity) 19 (H) <18 ng/L    Comment: (NOTE) Elevated high sensitivity troponin I (hsTnI) values and significant  changes across serial measurements may suggest ACS but many other  chronic and acute conditions are known to elevate hsTnI results.  Refer to the "Links" section for chest pain algorithms and additional  guidance. Performed at [facility] Lab, [address] [place], [place] [address]   Blood gas, venous     Status: Abnormal   Collection Time: 09/27/20  1:40 PM  Result Value Ref Range   FIO2 21.00    pH, Ven 7.463 (H) 7.25 - 7.43   pCO2, Ven 35.9 (L) 44 - 60 mmHg   Bicarbonate 25.4 20.0 - 28.0 mmol/L   Acid-Base Excess 1.9 0.0 - 2.0 mmol/L   O2 Saturation 43.5 %   Patient temperature 37.0    Sample type VEIN     Comment: Performed at [facility], [address] [place], [place] [address]  Troponin I (High Sensitivity)     Status: Abnormal   Collection Time: 09/27/20  2:52 PM  Result Value Ref Range   Troponin I (High Sensitivity) 23 (H) <18 ng/L    Comment: (NOTE) Elevated high sensitivity troponin I (hsTnI) values and significant  changes across serial measurements may suggest ACS but many other  chronic and acute conditions are known to elevate hsTnI results.  Refer to the "Links" section for chest pain algorithms and additional  guidance. Performed at [facility], [address] [place], [place] [address]   Magnesium     Status: None   Collection Time: 09/28/20 12:58 AM  Result Value Ref Range   Magnesium 2.1 1.7 - 2.4 mg/dL    Comment: Performed at [facility] Lab, [address] [place], [place] [address]  Procalcitonin     Status: None   Collection Time: 09/28/20 12:58 AM  Result Value Ref Range   Procalcitonin 11.09 ng/mL    Comment:        Interpretation: PCT >= 10 ng/mL: Important systemic inflammatory response, almost exclusively due to severe bacterial sepsis or septic  shock. (NOTE)       Sepsis PCT Algorithm           Lower Respiratory Tract                                      Infection PCT Algorithm    ----------------------------     ----------------------------         PCT <  0.25 ng/mL                PCT < 0.10 ng/mL          Strongly encourage             Strongly discourage   discontinuation of antibiotics    initiation of antibiotics    ----------------------------     -----------------------------       PCT 0.25 - 0.50 ng/mL            PCT 0.10 - 0.25 ng/mL               OR       >80% decrease in PCT            Discourage initiation of                                            antibiotics      Encourage discontinuation           of antibiotics    ----------------------------     -----------------------------         PCT >= 0.50 ng/mL              PCT 0.26 - 0.50 ng/mL                AND       <80% decrease in PCT             Encourage initiation of                                             antibiotics       Encourage continuation           of antibiotics    ----------------------------     -----------------------------        PCT >= 0.50 ng/mL                  PCT > 0.50 ng/mL               AND         increase in PCT                  Strongly encourage                                      initiation of antibiotics    Strongly encourage escalation           of antibiotics                                     -----------------------------                                           PCT <= 0.25 ng/mL  OR                                        > 80% decrease in PCT                                      Discontinue / Do not initiate                                             antibiotics  Performed at [facility] Lab, [address] [place], [place] [address]   CBC     Status: Abnormal   Collection Time: 09/28/20  7:57 AM  Result Value Ref Range   WBC 15.5 (H) 4.0 - 10.5 K/uL   RBC  4.25 4.22 - 5.81 MIL/uL   Hemoglobin 15.2 13.0 - 17.0 g/dL   HCT 78.5 39 - 52 %   MCV 106.8 (H) 80.0 - 100.0 fL   MCH 35.8 (H) 26.0 - 34.0 pg   MCHC 33.5 30.0 - 36.0 g/dL   RDW 88.5 02.7 - 74.1 %   Platelets 97 (L) 150 - 400 K/uL    Comment: REPEATED TO VERIFY SPECIMEN CHECKED FOR CLOTS Immature Platelet Fraction may be clinically indicated, consider ordering this additional test [id number] CONSISTENT WITH PREVIOUS RESULT    nRBC 0.0 0.0 - 0.2 %    Comment: Performed at [facility] Lab, [address] [place], [place] [address]  Comprehensive metabolic panel     Status: Abnormal   Collection Time: 09/28/20  7:57 AM  Result Value Ref Range   Sodium 133 (L) 135 - 145 mmol/L   Potassium 3.9 3.5 - 5.1 mmol/L   Chloride 100 98 - 111 mmol/L   CO2 21 (L) 22 - 32 mmol/L   Glucose, Bld 239 (H) 70 - 99 mg/dL    Comment: Glucose reference range applies only to samples taken after fasting for at least 8 hours.   BUN 16 6 - 20 mg/dL   Creatinine, Ser 0.96 (H) 0.61 - 1.24 mg/dL   Calcium 8.2 (L) 8.9 - 10.3 mg/dL   Total Protein 6.1 (L) 6.5 - 8.1 g/dL   Albumin 2.5 (L) 3.5 - 5.0 g/dL   AST 46 (H) 15 - 41 U/L   ALT 35 0 - 44 U/L   Alkaline Phosphatase 63 38 - 126 U/L   Total Bilirubin 4.1 (H) 0.3 - 1.2 mg/dL   GFR, Estimated >28 >36 mL/min    Comment: (NOTE) Calculated using the CKD-EPI Creatinine Equation (2021)    Anion gap 12 5 - 15    Comment: Performed at [facility] Lab, [address], [place], [place] [address]  Magnesium     Status: None   Collection Time: 09/28/20  7:57 AM  Result Value Ref Range   Magnesium 2.1 1.7 - 2.4 mg/dL    Comment: Performed at [facility], [address] [place], [place] [address]  Phosphorus     Status: Abnormal   Collection Time: 09/28/20  7:57 AM  Result Value Ref Range   Phosphorus 2.2 (L) 2.5 - 4.6 mg/dL    Comment: Performed at [facility] Lab, [address], [place], [place] [address]  Lactic acid, plasma     Status:  Abnormal   Collection Time: 09/28/20  9:32 AM  Result Value Ref Range   Lactic Acid, Venous 2.7 (HH) 0.5 - 1.9 mmol/L    Comment: CRITICAL VALUE NOTED.  VALUE IS CONSISTENT WITH PREVIOUSLY REPORTED AND CALLED VALUE. Performed at [facility] Lab, [address] [place], [place] [address]   Urinalysis, Routine w reflex microscopic     Status: Abnormal   Collection Time: 09/28/20 10:20 AM  Result Value Ref Range   Color, Urine AMBER (A) YELLOW    Comment: BIOCHEMICALS MAY BE AFFECTED BY COLOR   APPearance CLOUDY (A) CLEAR   Specific Gravity, Urine 1.032 (H) 1.005 - 1.030   pH 5.0 5.0 - 8.0   Glucose, UA >=500 (A) NEGATIVE mg/dL   Hgb urine dipstick MODERATE (A) NEGATIVE   Bilirubin Urine NEGATIVE NEGATIVE   Ketones, ur 20 (A) NEGATIVE mg/dL   Protein, ur 098 (A) NEGATIVE mg/dL   Nitrite NEGATIVE NEGATIVE   Leukocytes,Ua NEGATIVE NEGATIVE   RBC / HPF 0-5 0 - 5 RBC/hpf   WBC, UA 0-5 0 - 5 WBC/hpf   Bacteria, UA NONE SEEN NONE SEEN   Squamous Epithelial / LPF 0-5 0 - 5   Mucus PRESENT     Comment: Performed at [facility] Lab, [address] [place], [place] [address]    CT ABDOMEN W CONTRAST  Result Date: 09/28/2020 CLINICAL DATA:  Abdominal pain, fever, follow-up pancreatitis, concern for necrosis EXAM: CT ABDOMEN WITH CONTRAST TECHNIQUE: Multidetector CT imaging of the abdomen was performed using the standard protocol following bolus administration of intravenous contrast. CONTRAST:  OMNIPAQUE IOHEXOL 300 MG/ML  SOLN COMPARISON:  09/26/2020 FINDINGS: Lower chest: New moderate bilateral pleural effusions and associated atelectasis or consolidation. Hepatobiliary: No focal liver abnormality is seen. No gallstones, gallbladder wall thickening, or biliary dilatation. Pancreas: Redemonstrated extensive inflammatory stranding about the pancreas and adjacent structures, with an interval increase in retroperitoneal inflammatory fluid. There is new hypoenhancement of the majority  of the pancreatic body and tail, involving approximately the distal 9.2 cm of the pancreas (series 3, image 27). There is a new fluid collection of the anterior pancreatic head measuring 3.2 x 2.9 cm (series 3, image 37). The pancreatic duct cannot be distinctly visualized. Spleen: Normal in size without focal abnormality. Adrenals/Urinary Tract: Adrenal glands are unremarkable. Kidneys are normal, without renal calculi, focal lesion, or hydronephrosis. Bladder is unremarkable. Stomach/Bowel: Stomach is within normal limits. Appendix appears normal. No evidence of bowel wall thickening, distention, or inflammatory changes. Vascular/Lymphatic: No significant vascular findings are present. No enlarged abdominal or pelvic lymph nodes. Other: No abdominal wall hernia or abnormality. New small volume perihepatic and perisplenic ascites. Musculoskeletal: No acute or significant osseous findings. IMPRESSION: 1. Redemonstrated extensive inflammatory stranding about the pancreas and adjacent structures, with an interval increase in retroperitoneal inflammatory fluid. 2. There is new hypoenhancement of the majority of the pancreatic body and tail, involving approximately the distal 9.2 cm of the pancreas. 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 3. There is a new fluid collection of the anterior pancreatic head measuring 3.2 x 2.9 cm, consistent with developing pseudocyst. 4. New small volume ascites. 5. New moderate bilateral pleural effusions and associated atelectasis or consolidation. Electronically Signed   By: Lauralyn Primes M.D.   On: 09/28/2020 11:03   DG CHEST PORT 1 VIEW  Result Date: 09/27/2020 CLINICAL DATA:  Initial evaluation for acute respiratory  distress. EXAM: PORTABLE CHEST 1 VIEW COMPARISON:  Prior radiograph from 09/26/2020. FINDINGS: Cardiac and mediastinal silhouette stable, and remain within normal limits. Lungs are hypoinflated with secondary mild bibasilar subsegmental atelectasis. No focal  infiltrates. No edema or effusion. No pneumothorax. Osseous structures unchanged. IMPRESSION: 1. Low lung volumes with secondary mild bibasilar subsegmental atelectasis. 2. No other active cardiopulmonary disease. Electronically Signed   By: Rise Mu M.D.   On: 09/27/2020 03:37   ECHOCARDIOGRAM COMPLETE  Result Date: 09/28/2020    ECHOCARDIOGRAM REPORT   Patient Name:   RAUNEL DIMARTINO  Date of Exam: 09/28/2020 Medical Rec #:  [id number]  Height:       77.0 in Accession #:    [id number] Weight:       176.5 lb Date of Birth:  08/23/1986   BSA:          2.121 m Patient Age:    34 years   BP:           148/105 mmHg Patient Gender: M          HR:           120 bpm. Exam Location:  Inpatient Procedure: 2D Echo, Cardiac Doppler and Color Doppler Indications:    ; R00.0 Tachycardia  History:        Patient has no prior history of Echocardiogram examinations.                 Arrythmias:PAC; Signs/Symptoms:Bacteremia and Altered Mental                 Status. ETOH. Delirium. Pancreatitis.  Sonographer:    Sheralyn Boatman RDCS Referring Phys: [id number] EKTA V PATEL  Sonographer Comments: Suboptimal subcostal window and Technically difficult study due to poor echo windows. Patient had hiccups througout exam. Could not follow directions. IMPRESSIONS  1. Left ventricular ejection fraction, by estimation, is 60 to 65%. The left ventricle has normal function. The left ventricle has no regional wall motion abnormalities. There is mild left ventricular hypertrophy. Left ventricular diastolic parameters were normal.  2. Right ventricular systolic function is normal. The right ventricular size is normal.  3. The mitral valve is normal in structure. No evidence of mitral valve regurgitation. No evidence of mitral stenosis.  4. The aortic valve is tricuspid. Aortic valve regurgitation is not visualized. No aortic stenosis is present.  5. The inferior vena cava is normal in size with greater than 50% respiratory variability, suggesting  right atrial pressure of 3 mmHg. FINDINGS  Left Ventricle: Left ventricular ejection fraction, by estimation, is 60 to 65%. The left ventricle has normal function. The left ventricle has no regional wall motion abnormalities. The left ventricular internal cavity size was normal in size. There is  mild left ventricular hypertrophy. Left ventricular diastolic parameters were normal. Right Ventricle: The right ventricular size is normal. No increase in right ventricular wall thickness. Right ventricular systolic function is normal. Left Atrium: Left atrial size was normal in size. Right Atrium: Right atrial size was normal in size. Pericardium: There is no evidence of pericardial effusion. Mitral Valve: The mitral valve is normal in structure. No evidence of mitral valve regurgitation. No evidence of mitral valve stenosis. Tricuspid Valve: The tricuspid valve is normal in structure. Tricuspid valve regurgitation is mild . No evidence of tricuspid stenosis. Aortic Valve: The aortic valve is tricuspid. Aortic valve regurgitation is not visualized. No aortic stenosis is present. Pulmonic Valve: The pulmonic valve was normal in structure. Pulmonic valve regurgitation is  not visualized. No evidence of pulmonic stenosis. Aorta: The aortic root is normal in size and structure. Venous: The inferior vena cava is normal in size with greater than 50% respiratory variability, suggesting right atrial pressure of 3 mmHg. IAS/Shunts: No atrial level shunt detected by color flow Doppler.  LEFT VENTRICLE PLAX 2D LVIDd:         4.30 cm      Diastology LVIDs:         3.20 cm      LV e' medial:    15.80 cm/s LV PW:         1.20 cm      LV E/e' medial:  3.6 LV IVS:        1.40 cm      LV e' lateral:   9.03 cm/s LVOT diam:     2.30 cm      LV E/e' lateral: 6.2 LV SV:         56 LV SV Index:   26 LVOT Area:     4.15 cm  LV Volumes (MOD) LV vol d, MOD A2C: 62.1 ml LV vol d, MOD A4C: 109.0 ml LV vol s, MOD A2C: 20.5 ml LV vol s, MOD A4C: 47.3  ml LV SV MOD A2C:     41.6 ml LV SV MOD A4C:     109.0 ml LV SV MOD BP:      50.7 ml RIGHT VENTRICLE RV S prime:     20.80 cm/s TAPSE (M-mode): 1.9 cm LEFT ATRIUM             Index       RIGHT ATRIUM           Index LA diam:        3.60 cm 1.70 cm/m  RA Area:     12.10 cm LA Vol (A2C):   18.9 ml 8.91 ml/m  RA Volume:   27.50 ml  12.96 ml/m LA Vol (A4C):   26.6 ml 12.54 ml/m LA Biplane Vol: 22.7 ml 10.70 ml/m  AORTIC VALVE             PULMONIC VALVE LVOT Vmax:   102.00 cm/s PR End Diast Vel: 1.99 msec LVOT Vmean:  61.700 cm/s LVOT VTI:    0.134 m  AORTA Ao Root diam: 3.40 cm Ao Asc diam:  3.00 cm MITRAL VALVE MV Area (PHT): 7.44 cm    SHUNTS MV Decel Time: 102 msec    Systemic VTI:  0.13 m MV E velocity: 56.20 cm/s  Systemic Diam: 2.30 cm MV A velocity: 76.40 cm/s MV E/A ratio:  0.74 Charlton Haws MD Electronically signed by Charlton Haws MD Signature Date/Time: 09/28/2020/11:17:52 AM    Final    US Abdomen Limited RUQ (LIVER/GB)  Result Date: 09/26/2020 CLINICAL DATA:  34 year old male with acute pancreatitis. EXAM: ULTRASOUND ABDOMEN LIMITED RIGHT UPPER QUADRANT COMPARISON:  CT Abdomen and Pelvis 0939 hours today. FINDINGS: Gallbladder: No gallstones or wall thickening visualized. No sonographic Murphy sign noted by sonographer. Common bile duct: Diameter: 2 mm, normal. Liver: Echogenic liver (image 30). Trace perihepatic free fluid (image 16). No discrete liver lesion. No intrahepatic ductal dilatation. Portal vein is patent on color Doppler imaging with normal direction of blood flow towards the liver. Other: Negative visible right kidney. IMPRESSION: 1. Trace free fluid.  Hepatic steatosis. 2. Otherwise negative right upper quadrant Electronically Signed   By: Odessa Fleming M.D.   On: 09/26/2020 16:02    Imaging: As Above  A/P: Carlos [name] is an 34 y.o. 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 Given his elevated  bilirubin, he may have external compression of his intrapancreatic common bile duct from the inflammatory collection it the head of his pancreas. He will also need additional resuscitation and a higher level of care.   - Fractionated bilirubin - If direct component elevated, will need a GI consult for an ERCP to interrogate the common bile duct with possible stenting  - Foley catheter for strict I/O monitoring - Continued fluid boluses until UOP >0.5 ml/kg/hr - Remain NPO - Post-duodenal Dopoff tube placement to establish enteric feeding access, would start tube feeding once established  - Recommend transfer to the Intensive Care Unit given his persistent tachycardia and tachypnea  - General Surgery to continue to follow   Lannette Donathavid Alaa Eyerman, MD General Surgery

## 2020-09-28 NOTE — Progress Notes (Signed)
PROGRESS NOTE    Carlos [name]  DVV:[id number] DOB: 03-24-1986 DOA: 09/26/2020  PCP: Carlos Seller, NP    Brief Narrative:  Carlos [name] is a 34 y.o. male with no significant medical history presenting with abdominal pain.  He reports that he developed severe chest pain about 9pm yesterday.  He is actually pointing to the LUQ.  The pain woke him up from sleep.  No fever.  Nothing seems to make it better or worse.  He had a similar episode about 6 months ago but did not require hospitalization.  He drinks about 8 beers daily.  His last day of not drinking alcohol is unknown.  Denies h/o ETOH withdrawal. Denies drug use.  He does still have a gallbladder.  He takes only vitamins regularly.   ED Course:  Pancreatitis - alcoholic.  Drinks 8+ beers/day.  Started LUQ pain with radiation to the back last night.  Lipase 2503.  Elevated LFTs but not bili.  CT done with possible ileus.  Pain not controlled in ER - may need a PCA.   Assessment & Plan: Acute alcoholic pancreatitis Assessment & Plan Blood pressure (!) 132/99, pulse (!) 133, temperature 99 F (37.2 C), temperature source Oral, resp. rate (!) 43, height 6\' 5"  (1.956 m), weight 80.1 kg, SpO2 95 %. Patient afebrile overnight, repeat lipase is pending. Lipid panel reviewed Results for Carlos [name], Carlos [name] (MRN Carlos [name]) as of 09/27/2020 07:59  Ref. Range 09/26/2020 12:11  Total CHOL/HDL Ratio Latest Units: RATIO 2.1  Cholesterol Latest Ref Range: 0 - 200 mg/dL 09/28/2020  HDL Cholesterol Latest Ref Range: >40 mg/dL 76  LDL (calc) Latest Ref Range: 0 - 99 mg/dL 78  Triglycerides Latest Ref Range: <150 mg/dL 44  VLDL Latest Ref Range: 0 - 40 mg/dL 9  Supportive measures with pain control, supplemental oxygen if needed. Continued ongoing IV fluid and electrolyte replacement. IV PPI therapy and antiemetics as needed.  09/28/20 Pt denies any pain , repeat imaging shows more well defined area concerning for pancreatitis necrosis pseudocyst , and I have  consulted general surgery.PRN pain meds. IV abx continued.  Aggressive IVF resuscitation.   Severe sepsis (HCC) Assessment & Plan Blood pressure (!) 135/92, pulse (!) 145, temperature 99 F (37.2 C), temperature source Oral, resp. rate (!) 51, height 6\' 5"  (1.956 m), weight 80.1 kg, SpO2 95 %. Pt meets severe sepsis criteria due to leucocytosis/ tachycardia and lactic acidosis.  Liver function is improving, kidney function is stable. Due to elevated procalcitonin we have started empiric iv abx with zosyn. I suspect pt will need repeat ct abd and pelvis with contrast if clinically he declines in anyway. 09/28/20 Pt seen today he is still in sinus tachycardia  And elevated lactic acid level and sepsis like picture cultures are pending. We will continue with ivf , abx and prn tylenol. Pt is being transferred to PCCM as he is getting worse and will need increased monitoring. I suspect pt will need surgery.   Sinus tachycardia Assessment & Plan Cont with ivf rehydration. Echo results below: IMPRESSIONS  1. Left ventricular ejection fraction, by estimation, is 60 to 65%. The  left ventricle has normal function. The left ventricle has no regional  wall motion abnormalities. There is mild left ventricular hypertrophy.  Left ventricular diastolic parameters  were normal.  2. Right ventricular systolic function is normal. The right ventricular  size is normal.  3. The mitral valve is normal in structure. No evidence of mitral valve  regurgitation. No evidence  of mitral stenosis.  4. The aortic valve is tricuspid. Aortic valve regurgitation is not  visualized. No aortic stenosis is present.  5. The inferior vena cava is normal in size with greater than 50%  respiratory variability, suggesting right atrial pressure of 3 mmHg.  Prn labetalol.  AKI Assessment & Plan Lab Results  Component Value Date   CREATININE 1.33 (H) 09/28/2020   CREATININE 1.20 09/27/2020   CREATININE 1.06  09/27/2020  new onset AKI attribute to dehydration, ?CIN, will monitor and request nephrology consult.   Transaminitis Assessment & Plan Mild transaminitis attribute to alcohol abuse and fatty liver, AST has gone down from 150-71, ALT has gone down from 10 8-51. Avoid hepatotoxic agents. Liver ultrasound obtained shows hepatic steatosis.  Hyponatremia Assessment & Plan Suspect hyponatremia secondary to hyperglycemia in addition to dehydration from alcohol abuse.We will continue to follow sodium and other electrolytes and monitor and replace. 09/28/20 Cont ivf hydration and monitor.  Cont iv abx.   Hyperglycemia Assessment & Plan Obtain A1c suspect diabetes mellitus insulin requiring secondary to pancreatic insufficiency questionable history of chronic pancreatitis. Greatly appreciate GI consult, management and care. a1c is 4.9   Tobacco dependence Assessment & Plan Counseling when patient's acute issues are stable. Continue with nicotine patch 21 mg.  Alcohol dependence syndrome [facility]) Assessment & Plan CIWA assessment protocol in place. Seizure and aspiration prophylaxis. Serum alcohol level pending. Close monitoring of electrolytes especially magnesium. Thiamine supplementation to prevent Warnicke's encephalopathy. Patient changed to Librium 4 times daily for the next 48 hours. Pt restarted on CIWA ativan protocol.      Continue IV thiamine.          DVT prophylaxis:  lovenox  Disposition Plan: Home  Consultants:  GI  Procedures:  none  Subjective/Overview: Pt admitted yesterday with abdominal pain. Attributed to acute alcoholic pancreatitis. CT of the abdomen and pelvis with contrast showedSevere interstitial edematous pancreatitis with early acute pancreatic collections. 2. Diminished enhancement throughout the gland not definitive at this time for signs of necrosis, perhaps due to severe pancreatic edema. Consider close follow-up based on clinical parameters  to exclude the possibility of developing glandular necrosis. 3. Signs of ascites. 4. Small hiatal hernia and mild circumferential distal esophageal thickening. Correlate with any clinical signs of esophagitis. 5. Probable hepatic steatosis. We will repeat CT of the abdomen once more prior to discharge depending on clinical findings and exam. On exam pt is somnolent and agitated and wife at bedside. D/w her plan  Of care.   Wife at bedside in am rounds worried about std. Labs show elevated bilirubin level, repeat imaging c/w necrotic pancreatitis and general surgery consulted.Pt seen by them and pt given additional 3 liter bolus and transferred to PCCM.  Objective: Vitals:   09/28/20 1055 09/28/20 1200 09/28/20 1400 09/28/20 1515  BP: (!) 139/92 (!) 148/98 (!) 149/95   Pulse: (!) 130 (!) 120 (!) 119 (!) 112  Resp: (!) 36 (!) 47 (!) 52 (!) 59  Temp: 98.4 F (36.9 C) 98.6 F (37 C)    TempSrc: Oral Oral    SpO2:  92% 93% 96%  Weight:      Height:       SpO2: 96 % O2 Flow Rate (L/min): 4 L/min FiO2 (%): 34 %  Intake/Output Summary (Last 24 hours) at 09/28/2020 1546 Last data filed at 09/28/2020 1500 Gross per 24 hour  Intake 3424.11 ml  Output 800 ml  Net 2624.11 ml   Filed Weights   09/26/20  1131  Weight: 80.1 kg    Examination: Blood pressure (!) 149/95, pulse (!) 112, temperature 98.6 F (37 C), temperature source Oral, resp. rate (!) 59, height 6\' 5"  (1.956 m), weight 80.1 kg, SpO2 96 %. Physical Exam Vitals and nursing note reviewed.  HENT:     Head: Normocephalic and atraumatic.     Right Ear: External ear normal.     Left Ear: External ear normal.     Nose: Nose normal.  Eyes:     General: Lids are normal.     Extraocular Movements: Extraocular movements intact.     Pupils: Pupils are equal, round, and reactive to light.  Cardiovascular:     Rate and Rhythm: Regular rhythm. Tachycardia present.     Pulses:          Dorsalis pedis pulses are 2+ on the right  side and 2+ on the left side.       Posterior tibial pulses are 2+ on the right side and 2+ on the left side.     Heart sounds: Normal heart sounds.  Pulmonary:     Effort: Pulmonary effort is normal.     Breath sounds: Normal breath sounds.  Abdominal:     General: Bowel sounds are absent. There is distension.     Palpations: There is no hepatomegaly or mass.     Tenderness: There is generalized abdominal tenderness. There is no guarding.     Hernia: No hernia is present.  Musculoskeletal:        General: Normal range of motion.     Right lower leg: No edema.     Left lower leg: No edema.  Neurological:     General: No focal deficit present.     Mental Status: He is alert. He is confused.     Cranial Nerves: Cranial nerves are intact.  Psychiatric:        Mood and Affect: Mood normal.        Behavior: Behavior normal.     Data Reviewed: I have personally reviewed following labs and imaging studies  Labs  Recent Labs    09/27/20 1032  CKTOTAL 190   Lab Results  Component Value Date   WBC 15.5 (H) 09/28/2020   HGB 15.2 09/28/2020   HCT 45.4 09/28/2020   MCV 106.8 (H) 09/28/2020   PLT 97 (L) 09/28/2020    Recent Labs  Lab 09/28/20 0757 09/28/20 0757 09/28/20 1309  NA 133*  --   --   K 3.9  --   --   CL 100  --   --   CO2 21*  --   --   BUN 16  --   --   CREATININE 1.33*  --   --   CALCIUM 8.2*  --   --   PROT 6.1*  --   --   BILITOT 4.1*   < > 3.3*  ALKPHOS 63  --   --   ALT 35  --   --   AST 46*  --   --   GLUCOSE 239*  --   --    < > = values in this interval not displayed.   Lab Results  Component Value Date   CHOL 163 09/26/2020   HDL 76 09/26/2020   LDLCALC 78 09/26/2020   TRIG 44 09/26/2020   No results found for: DDIMER Invalid input(s): POCBNP  echocardiogram   Radiology Studies: CT ABDOMEN W CONTRAST  Result Date: 09/28/2020 CLINICAL  DATA:  Abdominal pain, fever, follow-up pancreatitis, concern for necrosis EXAM: CT ABDOMEN WITH  CONTRAST TECHNIQUE: Multidetector CT imaging of the abdomen was performed using the standard protocol following bolus administration of intravenous contrast. CONTRAST:  OMNIPAQUE IOHEXOL 300 MG/ML  SOLN COMPARISON:  09/26/2020 FINDINGS: Lower chest: New moderate bilateral pleural effusions and associated atelectasis or consolidation. Hepatobiliary: No focal liver abnormality is seen. No gallstones, gallbladder wall thickening, or biliary dilatation. Pancreas: Redemonstrated extensive inflammatory stranding about the pancreas and adjacent structures, with an interval increase in retroperitoneal inflammatory fluid. There is new hypoenhancement of the majority of the pancreatic body and tail, involving approximately the distal 9.2 cm of the pancreas (series 3, image 27). There is a new fluid collection of the anterior pancreatic head measuring 3.2 x 2.9 cm (series 3, image 37). The pancreatic duct cannot be distinctly visualized. Spleen: Normal in size without focal abnormality. Adrenals/Urinary Tract: Adrenal glands are unremarkable. Kidneys are normal, without renal calculi, focal lesion, or hydronephrosis. Bladder is unremarkable. Stomach/Bowel: Stomach is within normal limits. Appendix appears normal. No evidence of bowel wall thickening, distention, or inflammatory changes. Vascular/Lymphatic: No significant vascular findings are present. No enlarged abdominal or pelvic lymph nodes. Other: No abdominal wall hernia or abnormality. New small volume perihepatic and perisplenic ascites. Musculoskeletal: No acute or significant osseous findings. IMPRESSION: 1. Redemonstrated extensive inflammatory stranding about the pancreas and adjacent structures, with an interval increase in retroperitoneal inflammatory fluid. 2. There is new hypoenhancement of the majority of the pancreatic body and tail, involving approximately the distal 9.2 cm of the pancreas. 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 3. There is a  new fluid collection of the anterior pancreatic head measuring 3.2 x 2.9 cm, consistent with developing pseudocyst. 4. New small volume ascites. 5. New moderate bilateral pleural effusions and associated atelectasis or consolidation. Electronically Signed   By: Lauralyn Primes M.D.   On: 09/28/2020 11:03   DG CHEST PORT 1 VIEW  Result Date: 09/27/2020 CLINICAL DATA:  Initial evaluation for acute respiratory distress. EXAM: PORTABLE CHEST 1 VIEW COMPARISON:  Prior radiograph from 09/26/2020. FINDINGS: Cardiac and mediastinal silhouette stable, and remain within normal limits. Lungs are hypoinflated with secondary mild bibasilar subsegmental atelectasis. No focal infiltrates. No edema or effusion. No pneumothorax. Osseous structures unchanged. IMPRESSION: 1. Low lung volumes with secondary mild bibasilar subsegmental atelectasis. 2. No other active cardiopulmonary disease. Electronically Signed   By: Rise Mu M.D.   On: 09/27/2020 03:37   ECHOCARDIOGRAM COMPLETE  Result Date: 09/28/2020    ECHOCARDIOGRAM REPORT   Patient Name:   LADD CEN  Date of Exam: 09/28/2020 Medical Rec #:  [id number]  Height:       77.0 in Accession #:    [id number] Weight:       176.5 lb Date of Birth:  05-18-1986   BSA:          2.121 m Patient Age:    34 years   BP:           148/105 mmHg Patient Gender: M          HR:           120 bpm. Exam Location:  Inpatient Procedure: 2D Echo, Cardiac Doppler and Color Doppler Indications:    ; R00.0 Tachycardia  History:        Patient has no prior history of Echocardiogram examinations.                 Arrythmias:PAC; Signs/Symptoms:Bacteremia and Altered  Mental                 Status. ETOH. Delirium. Pancreatitis.  Sonographer:    Sheralyn Boatman RDCS Referring Phys: [phone] Aaren Krog V Artemis Loyal  Sonographer Comments: Suboptimal subcostal window and Technically difficult study due to poor echo windows. Patient had hiccups througout exam. Could not follow directions. IMPRESSIONS  1. Left  ventricular ejection fraction, by estimation, is 60 to 65%. The left ventricle has normal function. The left ventricle has no regional wall motion abnormalities. There is mild left ventricular hypertrophy. Left ventricular diastolic parameters were normal.  2. Right ventricular systolic function is normal. The right ventricular size is normal.  3. The mitral valve is normal in structure. No evidence of mitral valve regurgitation. No evidence of mitral stenosis.  4. The aortic valve is tricuspid. Aortic valve regurgitation is not visualized. No aortic stenosis is present.  5. The inferior vena cava is normal in size with greater than 50% respiratory variability, suggesting right atrial pressure of 3 mmHg. FINDINGS  Left Ventricle: Left ventricular ejection fraction, by estimation, is 60 to 65%. The left ventricle has normal function. The left ventricle has no regional wall motion abnormalities. The left ventricular internal cavity size was normal in size. There is  mild left ventricular hypertrophy. Left ventricular diastolic parameters were normal. Right Ventricle: The right ventricular size is normal. No increase in right ventricular wall thickness. Right ventricular systolic function is normal. Left Atrium: Left atrial size was normal in size. Right Atrium: Right atrial size was normal in size. Pericardium: There is no evidence of pericardial effusion. Mitral Valve: The mitral valve is normal in structure. No evidence of mitral valve regurgitation. No evidence of mitral valve stenosis. Tricuspid Valve: The tricuspid valve is normal in structure. Tricuspid valve regurgitation is mild . No evidence of tricuspid stenosis. Aortic Valve: The aortic valve is tricuspid. Aortic valve regurgitation is not visualized. No aortic stenosis is present. Pulmonic Valve: The pulmonic valve was normal in structure. Pulmonic valve regurgitation is not visualized. No evidence of pulmonic stenosis. Aorta: The aortic root is normal in  size and structure. Venous: The inferior vena cava is normal in size with greater than 50% respiratory variability, suggesting right atrial pressure of 3 mmHg. IAS/Shunts: No atrial level shunt detected by color flow Doppler.  LEFT VENTRICLE PLAX 2D LVIDd:         4.30 cm      Diastology LVIDs:         3.20 cm      LV e' medial:    15.80 cm/s LV PW:         1.20 cm      LV E/e' medial:  3.6 LV IVS:        1.40 cm      LV e' lateral:   9.03 cm/s LVOT diam:     2.30 cm      LV E/e' lateral: 6.2 LV SV:         56 LV SV Index:   26 LVOT Area:     4.15 cm  LV Volumes (MOD) LV vol d, MOD A2C: 62.1 ml LV vol d, MOD A4C: 109.0 ml LV vol s, MOD A2C: 20.5 ml LV vol s, MOD A4C: 47.3 ml LV SV MOD A2C:     41.6 ml LV SV MOD A4C:     109.0 ml LV SV MOD BP:      50.7 ml RIGHT VENTRICLE RV S prime:  20.80 cm/s TAPSE (M-mode): 1.9 cm LEFT ATRIUM             Index       RIGHT ATRIUM           Index LA diam:        3.60 cm 1.70 cm/m  RA Area:     12.10 cm LA Vol (A2C):   18.9 ml 8.91 ml/m  RA Volume:   27.50 ml  12.96 ml/m LA Vol (A4C):   26.6 ml 12.54 ml/m LA Biplane Vol: 22.7 ml 10.70 ml/m  AORTIC VALVE             PULMONIC VALVE LVOT Vmax:   102.00 cm/s PR End Diast Vel: 1.99 msec LVOT Vmean:  61.700 cm/s LVOT VTI:    0.134 m  AORTA Ao Root diam: 3.40 cm Ao Asc diam:  3.00 cm MITRAL VALVE MV Area (PHT): 7.44 cm    SHUNTS MV Decel Time: 102 msec    Systemic VTI:  0.13 m MV E velocity: 56.20 cm/s  Systemic Diam: 2.30 cm MV A velocity: 76.40 cm/s MV E/A ratio:  0.74 Charlton HawsPeter Nishan MD Electronically signed by Charlton HawsPeter Nishan MD Signature Date/Time: 09/28/2020/11:17:52 AM    Final      Anti-infectives (From admission, onward)   Start     Dose/Rate Route Frequency Ordered Stop   09/28/20 2000  piperacillin-tazobactam (ZOSYN) IVPB 3.375 g        3.375 g 12.5 mL/hr over 240 Minutes Intravenous Every 8 hours 09/28/20 1417     09/28/20 1500  meropenem (MERREM) 1 g in sodium chloride 0.9 % 100 mL IVPB  Status:  Discontinued         1 g 200 mL/hr over 30 Minutes Intravenous Every 8 hours 09/28/20 1412 09/28/20 1417   09/28/20 1015  linezolid (ZYVOX) IVPB 600 mg  Status:  Discontinued        600 mg 300 mL/hr over 60 Minutes Intravenous Every 12 hours 09/28/20 0917 09/28/20 1412   09/27/20 1200  piperacillin-tazobactam (ZOSYN) IVPB 3.375 g  Status:  Discontinued        3.375 g 100 mL/hr over 30 Minutes Intravenous Every 6 hours 09/27/20 0934 09/27/20 0940   09/27/20 1030  piperacillin-tazobactam (ZOSYN) IVPB 3.375 g  Status:  Discontinued        3.375 g 12.5 mL/hr over 240 Minutes Intravenous Every 8 hours 09/27/20 0940 09/28/20 1412       Continuous Infusions:  sodium chloride 200 mL/hr at 09/28/20 1537   piperacillin-tazobactam (ZOSYN)  IV       LOS: 2 days    Gertha CalkinEkta V Ajna Moors, MD [facility] Pager [phone] How to contact the [facility] Attending or Consulting provider 7A - 7P or covering provider during after hours 7P -7A, for this patient?    1. Check the care team in [facility] and look for a) attending/consulting [facility] provider listed and b) the [facility] team listed 2. Log into [email or web address] and use [place]'s universal password to access. If you do not have the password, please contact the hospital operator. 3. Locate the [facility] provider you are looking for under [facility] and page to a number that you can be directly reached. 4. If you still have difficulty reaching the provider, please page the [facility] (Director on Call) for the Hospitalists listed on amion for assistance. [email or web address] Password [email or web address] 09/28/2020, 3:46 PM

## 2020-09-28 NOTE — Progress Notes (Signed)
  Echocardiogram 2D Echocardiogram has been performed.  Janalyn Harder 09/28/2020, 10:50 AM

## 2020-09-29 ENCOUNTER — Inpatient Hospital Stay (HOSPITAL_COMMUNITY): Payer: Medicaid Other

## 2020-09-29 DIAGNOSIS — K8522 Alcohol induced acute pancreatitis with infected necrosis: Secondary | ICD-10-CM

## 2020-09-29 LAB — COMPREHENSIVE METABOLIC PANEL
ALT: 29 U/L (ref 0–44)
AST: 38 U/L (ref 15–41)
Albumin: 2 g/dL — ABNORMAL LOW (ref 3.5–5.0)
Alkaline Phosphatase: 46 U/L (ref 38–126)
Anion gap: 9 (ref 5–15)
BUN: 11 mg/dL (ref 6–20)
CO2: 21 mmol/L — ABNORMAL LOW (ref 22–32)
Calcium: 7.8 mg/dL — ABNORMAL LOW (ref 8.9–10.3)
Chloride: 106 mmol/L (ref 98–111)
Creatinine, Ser: 1.05 mg/dL (ref 0.61–1.24)
GFR, Estimated: >60 mL/min (ref >60)
Glucose, Bld: 131 mg/dL — ABNORMAL HIGH (ref 70–99)
Potassium: 3.6 mmol/L (ref 3.5–5.1)
Sodium: 136 mmol/L (ref 135–145)
Total Bilirubin: 2.1 mg/dL — ABNORMAL HIGH (ref 0.3–1.2)
Total Protein: 5.3 g/dL — ABNORMAL LOW (ref 6.5–8.1)

## 2020-09-29 LAB — PHOSPHORUS
Phosphorus: 1.6 mg/dL — ABNORMAL LOW (ref 2.5–4.6)
Phosphorus: 3 mg/dL (ref 2.5–4.6)
Phosphorus: 3 mg/dL (ref 2.5–4.6)

## 2020-09-29 LAB — ENA+DNA/DS+ANTICH+CENTRO+JO...
Anti JO-1: <0.2 AI (ref 0.0–0.9)
Centromere Ab Screen: <0.2 AI (ref 0.0–0.9)
Chromatin Ab SerPl-aCnc: <0.2 AI (ref 0.0–0.9)
ENA SM Ab Ser-aCnc: <0.2 AI (ref 0.0–0.9)
Ribonucleic Protein: 1 AI — ABNORMAL HIGH (ref 0.0–0.9)
SSA (Ro) (ENA) Antibody, IgG: <0.2 AI (ref 0.0–0.9)
SSB (La) (ENA) Antibody, IgG: <0.2 AI (ref 0.0–0.9)
Scleroderma (Scl-70) (ENA) Antibody, IgG: <0.2 AI (ref 0.0–0.9)
ds DNA Ab: <1 IU/mL (ref 0–9)

## 2020-09-29 LAB — GLUCOSE, CAPILLARY
Glucose-Capillary: 117 mg/dL — ABNORMAL HIGH (ref 70–99)
Glucose-Capillary: 113 mg/dL — ABNORMAL HIGH (ref 70–99)
Glucose-Capillary: 159 mg/dL — ABNORMAL HIGH (ref 70–99)
Glucose-Capillary: 106 mg/dL — ABNORMAL HIGH (ref 70–99)
Glucose-Capillary: 142 mg/dL — ABNORMAL HIGH (ref 70–99)
Glucose-Capillary: 137 mg/dL — ABNORMAL HIGH (ref 70–99)

## 2020-09-29 LAB — CBC
HCT: 33.7 % — ABNORMAL LOW (ref 39.0–52.0)
Hemoglobin: 11.6 g/dL — ABNORMAL LOW (ref 13.0–17.0)
MCH: 36 pg — ABNORMAL HIGH (ref 26.0–34.0)
MCHC: 34.4 g/dL (ref 30.0–36.0)
MCV: 104.7 fL — ABNORMAL HIGH (ref 80.0–100.0)
Platelets: 96 10*3/uL — ABNORMAL LOW (ref 150–400)
RBC: 3.22 MIL/uL — ABNORMAL LOW (ref 4.22–5.81)
RDW: 12.4 % (ref 11.5–15.5)
WBC: 12.6 10*3/uL — ABNORMAL HIGH (ref 4.0–10.5)
nRBC: 0 % (ref 0.0–0.2)

## 2020-09-29 LAB — ANA W/REFLEX IF POSITIVE: Anti Nuclear Antibody (ANA): POSITIVE — AB

## 2020-09-29 LAB — MAGNESIUM
Magnesium: 2.1 mg/dL (ref 1.7–2.4)
Magnesium: 2.2 mg/dL (ref 1.7–2.4)
Magnesium: 2.1 mg/dL (ref 1.7–2.4)

## 2020-09-29 LAB — AMMONIA: Ammonia: 43 umol/L — ABNORMAL HIGH (ref 9–35)

## 2020-09-29 LAB — PROCALCITONIN: Procalcitonin: 12.39 ng/mL

## 2020-09-29 MED ORDER — LORAZEPAM 2 MG/ML IJ SOLN
1.0000 mg | INTRAMUSCULAR | Status: DC | PRN
  Administered 2020-09-29 – 2020-10-03 (×6): 2 mg via INTRAVENOUS
  Filled 2020-09-29 (×7): qty 1

## 2020-09-29 MED ORDER — POTASSIUM PHOSPHATES 15 MMOLE/5ML IV SOLN
30.0000 mmol | Freq: Once | INTRAVENOUS | Status: AC
  Administered 2020-09-29: 30 mmol via INTRAVENOUS
  Filled 2020-09-29: qty 10

## 2020-09-29 MED ORDER — ACETAMINOPHEN 650 MG RE SUPP: 650.0000 mg | Freq: Four times a day (QID) | RECTAL | Status: DC | PRN

## 2020-09-29 MED ORDER — PROSOURCE TF PO LIQD
45.0000 mL | Freq: Two times a day (BID) | ORAL | Status: DC
  Administered 2020-09-29 – 2020-10-02 (×8): 45 mL
  Filled 2020-09-29 (×6): qty 45

## 2020-09-29 MED ORDER — THIAMINE HCL 100 MG/ML IJ SOLN
100.0000 mg | Freq: Every day | INTRAMUSCULAR | Status: DC
  Administered 2020-09-29 – 2020-09-30 (×2): 100 mg via INTRAVENOUS
  Filled 2020-09-29: qty 2

## 2020-09-29 MED ORDER — PHENOBARBITAL SODIUM 130 MG/ML IJ SOLN
130.0000 mg | Freq: Three times a day (TID) | INTRAMUSCULAR | Status: AC
  Administered 2020-09-29 – 2020-09-30 (×6): 130 mg via INTRAVENOUS
  Filled 2020-09-29 (×6): qty 1

## 2020-09-29 MED ORDER — THIAMINE HCL 100 MG PO TABS
100.0000 mg | ORAL_TABLET | Freq: Every day | ORAL | Status: DC
  Administered 2020-10-01 – 2020-10-02 (×2): 100 mg
  Filled 2020-09-29 (×3): qty 1

## 2020-09-29 MED ORDER — ADULT MULTIVITAMIN W/MINERALS CH
1.0000 | ORAL_TABLET | Freq: Every day | ORAL | Status: DC
  Administered 2020-09-29 – 2020-10-02 (×4): 1
  Filled 2020-09-29 (×4): qty 1

## 2020-09-29 MED ORDER — ACETAMINOPHEN 325 MG PO TABS
650.0000 mg | ORAL_TABLET | Freq: Four times a day (QID) | ORAL | Status: DC | PRN
  Filled 2020-09-29: qty 2

## 2020-09-29 MED ORDER — PHENOBARBITAL SODIUM 130 MG/ML IJ SOLN
65.0000 mg | Freq: Three times a day (TID) | INTRAMUSCULAR | Status: DC
  Administered 2020-10-01 – 2020-10-02 (×4): 65 mg via INTRAVENOUS
  Filled 2020-09-29 (×5): qty 1

## 2020-09-29 MED ORDER — WHITE PETROLATUM EX OINT
TOPICAL_OINTMENT | CUTANEOUS | Status: AC
  Administered 2020-09-29: 0.2
  Filled 2020-09-29: qty 28.35

## 2020-09-29 MED ORDER — PHENOBARBITAL SODIUM 130 MG/ML IJ SOLN: 30.0000 mg | Freq: Three times a day (TID) | INTRAMUSCULAR | Status: DC

## 2020-09-29 MED ORDER — FOLIC ACID 1 MG PO TABS
1.0000 mg | ORAL_TABLET | Freq: Every day | ORAL | Status: DC
  Administered 2020-09-29 – 2020-10-02 (×4): 1 mg
  Filled 2020-09-29 (×4): qty 1

## 2020-09-29 MED ORDER — VITAL 1.5 CAL PO LIQD
1000.0000 mL | ORAL | Status: DC
  Administered 2020-09-29: 1000 mL

## 2020-09-29 NOTE — Progress Notes (Signed)
eLink Physician-Brief Progress Note Patient Name: Carlos [name] DOB: May 04, 1986 MRN: [id number]   Date of Service  09/29/2020  HPI/Events of Note  K+ = 3.6, PO4--- =  1.6 and  Creatinine = 1.05.  eICU Interventions  Plan: 1. Replace K+ and Mg++. 2. Repeat Phosphorus level at 12 noon.     Intervention Category Major Interventions: Electrolyte abnormality - evaluation and management  Naliya Gish Eugene 09/29/2020, 5:13 AM

## 2020-09-29 NOTE — Progress Notes (Signed)
eLink Physician-Brief Progress Note Patient Name: Carlos [name] DOB: 15-Jun-1986 MRN: [id number]   Date of Service  09/29/2020  HPI/Events of Note  Frequent loose stools - Request for Flexiseal.   eICU Interventions  Plan: 1. Place Flexiseal.      Intervention Category Major Interventions: Other:  [name] 09/29/2020, 7:53 PM

## 2020-09-29 NOTE — Progress Notes (Signed)
[facility] Progress Note  Name: Carlos [name] MRN: [id number] DOB: 08-21-1986   Subjective:  Had a foley placed yesterday.  patient had 1.1 liters UOP Over 11/21.  RN reports had has been confused. Noted phos repleted  Review of systems:  Limited to confusion and pt on sedation though denies shortness of breath Denies abd pain to me  No cp    Intake/Output Summary (Last 24 hours) at 09/29/2020 0714 Last data filed at 09/29/2020 0600 Gross per 24 hour  Intake 5121.87 ml  Output 1100 ml  Net 4021.87 ml    Vitals:  Vitals:   09/29/20 0600 09/29/20 0630 09/29/20 0700 09/29/20 0714  BP: 123/86 105/76 (!) 125/94   Pulse: 97 93 (!) 102   Resp: (!) 39 (!) 26 (!) 30   Temp:    98.1 F (36.7 C)  TempSrc:    Axillary  SpO2: (!) 85% 91% (!) 86% 93%  Weight:      Height:         Physical Exam:  General adult male in bed in no acute distress HEENT normocephalic atraumatic extraocular movements intact sclera anicteric Neck supple trachea midline Lungs clear to auscultation bilaterally normal work of breathing at rest; on 3 liters oxygen Heart tachy; S1S2 no rub appreciated Abdomen tight and sl distended but nontender  Extremities trace edema  Psych no anxiety or agitation  Neuro - confused; oriented to person and year. On cont sedation   Medications reviewed   Labs:  BMP Latest Ref Rng & Units 09/29/2020 09/28/2020 09/27/2020  Glucose 70 - 99 mg/dL 131(H) 239(H) 227(H)  BUN 6 - 20 mg/dL '11 16 8  ' Creatinine 0.61 - 1.24 mg/dL 1.05 1.33(H) 1.20  BUN/Creat Ratio 6 - 22 (calc) - - -  Sodium 135 - 145 mmol/L 136 133(L) 131(L)  Potassium 3.5 - 5.1 mmol/L 3.6 3.9 4.6  Chloride 98 - 111 mmol/L 106 100 99  CO2 22 - 32 mmol/L 21(L) 21(L) 23  Calcium 8.9 - 10.3 mg/dL 7.8(L) 8.2(L) 8.2(L)     Assessment/Plan:   1. AKI - mild, creat 1.33, eGFR around 65- 80 cc/min, in setting of severe acute pancreatitis.  Has rec'd IV contrast twice.  UA is unremarkable.  CT abd  shows normal appearing kidneys w/o hydro. Rapid breathing c/w SIRS response to panc inflammation.  Thankfully urine output and cr improving.  Would lower if UOP is not keeping up with input. Would try to avoid further IV contrast for this patient if possible.   2. Severe acute etoh pancreatitis - 1st episode, no other PMH.  Per primary.  3. ETOH abuse - in withdrawal w/ confusion, per primary team   Cr and uop thankfully improved.  Nephrology will sign off.    Claudia Desanctis, MD 09/29/2020 7:23 AM

## 2020-09-29 NOTE — Procedures (Signed)
Cortrak  Person Inserting Tube:  Renie Ora, RD Tube Type:  Cortrak - 43 inches Tube Location:  Left nare Initial Placement:  Postpyloric Secured by: Bridle Technique Used to Measure Tube Placement:  Documented cm marking at nare/ corner of mouth Cortrak Secured At:  97 cm Procedure Comments:  Cortrak Tube Team Note:  Consult received to place a Cortrak feeding tube.   X-ray is required, abdominal x-ray has been ordered by the Cortrak team. Please confirm tube placement before using the Cortrak tube.   If the tube becomes dislodged please keep the tube and contact the Cortrak team at [email or web address] (password [email or web address]) for replacement.  If after hours and replacement cannot be delayed, place a NG tube and confirm placement with an abdominal x-ray.       Trenton Gammon, MS, RD, LDN, CNSC Inpatient Clinical Dietitian RD pager # available in AMION  After hours/weekend pager # available in [facility]

## 2020-09-29 NOTE — Progress Notes (Signed)
NAME:  Markice Torbert, MRN:  [id number], DOB:  October 12, 1986, LOS: 3 ADMISSION DATE:  09/26/2020, CONSULTATION DATE:  09/28/2020 REFERRING MD:  Dr. Allena Katz, CHIEF COMPLAINT:  Pancreatitis w/necrosis   Brief History   34 year old admitted with acute pancreatitis, presumably due to ETOH abuse.  Developed clinical worsening on 11/21 with high CIWA scores, persistent tachycardia, tachypnea, new AKI, elevating t. Bili, and repeat CT showing new changes consistent with necrotizing pancreatitis.  GI and CCS following.  PCCM consulted for further ICU care.   History of present illness    34 year old male with no significant past medical history other than tobacco abuse and daily ETOH use.  Wife reports chronic history of ETOH, however very heavy over the last year; last drink on 11/18.  He presented on 11/19 with LUQ pain and non-bloody emesis.  Workup notable for lipase 2503, AST 150, ALT 108, t.bili 1. CT abd/ pelvis showing severe interstitial edematous pancreatitis with early acute pancreatic collections.  Admitted to [facility] with acute pancreatitis.  GI following.  On 11/21, he developed clinical worsening, with high CIWA scores > 20 on ativan, persistent tachycardia, tachypnea, new AKI, elevating t. Bili, and repeat CT showing new changes consistent with necrotizing pancreatitis.  PCCM consulted for ICU transfer and further management of aggressive resuscitation.    Past Medical History  Tobacco abuse, ETOH use (reported 8+ beers and liquor/ daily) Otherwise no pertinent past medical history   Significant Hospital Events   11/19 Admitted [facility] 11/21 PCCM consulted, tx ICU  Consults:  GI- [facility] PCCM  Procedures:   Significant Diagnostic Tests:  11/19 CT abd/ pelvis >> 1. Severe interstitial edematous pancreatitis with early acute pancreatic collections. 2. Diminished enhancement throughout the gland not definitive at this time for signs of necrosis, perhaps due to severe pancreatic edema. Consider  close follow-up based on clinical parameters to exclude the possibility of developing glandular necrosis. 3. Signs of ascites. 4. Small hiatal hernia and mild circumferential distal esophageal thickening.  Correlate with any clinical signs of esophagitis. 5. Probable hepatic steatosis.   11/19 limited RUQ [place] >> 1. Trace free fluid.  Hepatic steatosis. 2. Otherwise negative right upper quadrant   11/21 TTE >> LVEF 60-65%, no wall abnormalities, mild LVH, normal RV   11/21 CT abd w/contrast >> 1. Redemonstrated extensive inflammatory stranding about the pancreas and adjacent structures, with an interval increase in retroperitoneal inflammatory fluid. 2. There is new hypoenhancement of the majority of the pancreatic body and tail, involving approximately the distal 9.2 cm of the pancreas. 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 3. There is a new fluid collection of the anterior pancreatic head measuring 3.2 x 2.9 cm, consistent with developing pseudocyst. 4. New small volume ascites. 5. New moderate bilateral pleural effusions and associated atelectasis or consolidation.  Micro Data:  11/19 SARS 2/ Flu >> neg 11/20 BCx 2 >> 11/20 UC >> multiple species   Antimicrobials:  11/20 zosyn >> 11/21 linezolid >>  Interim history/subjective:  No acute distress on precedex gtt. C/o thirst.   Objective   Blood pressure 105/76, pulse 93, temperature 99.3 F (37.4 C), temperature source Oral, resp. rate (!) 26, height 6\' 5"  (1.956 m), weight 88.4 kg, SpO2 91 %.        Intake/Output Summary (Last 24 hours) at 09/29/2020 0658 Last data filed at 09/29/2020 0600 Gross per 24 hour  Intake 5121.87 ml  Output 1100 ml  Net 4021.87 ml   Filed Weights   09/26/20  1131 09/28/20 1720 09/29/20 0500  Weight: 80.1 kg 76.2 kg 88.4 kg   Examination: Constitutional: dehydrated man in NAD  Eyes: pupils equal, tracking Ears, nose, mouth, and throat: MM dry, trachea  midline Cardiovascular: RRR, ext warm Respiratory: tachypneic, diminished at bases, no accessory muscle use Gastrointestinal: firm, nontender, +BS Skin: No rashes, normal turgor Neurologic: moves all 4 ext briskly to exam Psychiatric: RASS -1  Sugars okay 1100 UoP +7.5L for admission    Resolved Hospital Problem list    Assessment & Plan:   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   - Trial of PO liquids, if intolerant would benefit from postpyloric tube if able - Continue LR - Additional pancreatic imaging PRN exam and labs  Acute hypoxemic respiratory failure secondary to atelectasis and third spacing - IS, flutter, OOB if tolerated - Wean O2 to maintain sats > 90%  Acute metabolic encephalopathy secondary to alcohol withdrawal- seems a bit better today, complicated by lack of PO. - Phenobarb taper IV, PRN ativan - Wean precedex - Thiamine, folate  Elevated WBC, Pct- presumed infection, some question of cholangitis - Zosyn fine for now, no need for linezolid  Acute kidney injury improved with IVF - Daily monitoring  Hypomagnesemia, hypophosphatemia- repleting   Best practice:  Diet: trial PO liquids Pain/Anxiety/Delirium protocol (if indicated): see above VAP protocol (if indicated): n/a DVT prophylaxis: lovenox GI prophylaxis: PPI BID Glucose control: SSI Mobility: BR Code Status: Full Family Communication: will update when they come in Disposition: ICU   Patient critically ill due to metabolic encephalopathy Interventions to address this today weaning precedex, starting phenobarbital Risk of deterioration without these interventions is high  I personally spent 34 minutes providing critical care not including any separately billable procedures  Myrla Halsted MD  Pulmonary Critical Care 09/29/2020 7:03 AM Personal pager: #[phone] If unanswered, please page CCM On-call: #[phone]

## 2020-09-29 NOTE — Progress Notes (Signed)
[facility] Gastroenterology Progress Note  Carlos [name] 34 y.o. 23-May-1986  CC:  Acute pancreatitis  Subjective: Patient denies any abdominal pain, nausea, or vomiting.  ROS : Review of Systems  Cardiovascular: Negative for chest pain and palpitations.  Gastrointestinal: Negative for abdominal pain, blood in stool, constipation, diarrhea, heartburn, melena, nausea and vomiting.   Objective: Vital signs in last 24 hours: Vitals:   09/29/20 0714 09/29/20 0800  BP:  110/85  Pulse:  90  Resp:  (!) 29  Temp: 98.1 F (36.7 C)   SpO2: 93% 93%    Physical Exam:  General:  Lethargic, ill-appearing, thin, no distress, appears stated age  Head:  Normocephalic, without obvious abnormality, atraumatic  Eyes:  Anicteric sclera, EOMs intact  Lungs:   Clear to auscultation bilaterally, mildly tachypneic  Heart:  Regular rate and rhythm, S1, S2 normal  Abdomen:   Somewhat firm, mildly distended, non-tender, bowel sounds active all four quadrants, no guarding or peritoneal signs  Extremities: Extremities normal, atraumatic, no  edema  Pulses: 2+ and symmetric    Lab Results: Recent Labs    09/28/20 0757 09/29/20 0110  NA 133* 136  K 3.9 3.6  CL 100 106  CO2 21* 21*  GLUCOSE 239* 131*  BUN 16 11  CREATININE 1.33* 1.05  CALCIUM 8.2* 7.8*  MG 2.1 2.1  PHOS 2.2* 1.6*   Recent Labs    09/28/20 0757 09/28/20 0757 09/28/20 1309 09/29/20 0110  AST 46*  --   --  38  ALT 35  --   --  29  ALKPHOS 63  --   --  46  BILITOT 4.1*   < > 3.3* 2.1*  PROT 6.1*  --   --  5.3*  ALBUMIN 2.5*  --   --  2.0*   < > = values in this interval not displayed.   Recent Labs    09/28/20 0757 09/29/20 0110  WBC 15.5* 12.6*  HGB 15.2 11.6*  HCT 45.4 33.7*  MCV 106.8* 104.7*  PLT 97* 96*   No results for input(s): LABPROT, INR in the last 72 hours.    Assessment: Acute necrotizing pancreatitis, presumably due to alcohol use.  No gallstones visualized on [place] or CT. -CT 11/21 showed extensive  inflammatory stranding, increased inflammatory fluid, new findings of pancreatic necrosis, developing pseudocyst, and new small volume ascites -Renal function improving: BUN 11/Cr 1.05 -T. Bili 2.1, improving. Other LFTs now WNL  Alcohol withdrawal  Plan: Continue supportive care and IVF.  Agree with post-pyloric feeds as per surgery recommendations.  [facility] will follow.  Edrick Kins PA-C 09/29/2020, 9:55 AM  Contact #  [phone]

## 2020-09-29 NOTE — Progress Notes (Signed)
CORTRAK NOTE  Cortrak tube placed this AM with reading as transverse duodenum. Tube manipulated and is now at 100 cm in the L nare, secured by bridle. Tube reading as jejunal placement on Cortrak machine.   Repeat abdominal x-ray ordered and pending.     Trenton Gammon, MS, RD, LDN, CNSC Inpatient Clinical Dietitian RD pager # available in AMION  After hours/weekend pager # available in [facility]

## 2020-09-29 NOTE — Progress Notes (Signed)
Progress Note     Subjective: Patient lethargic but arouses and answers questions and follows commands.  Denies abdominal pain, nausea or vomiting this AM. No flatus or BM.   Objective: Vital signs in last 24 hours: Temp:  [98.1 F (36.7 C)-100.4 F (38 C)] 98.6 F (37 C) (11/22 1126) Pulse Rate:  [86-127] 98 (11/22 1200) Resp:  [21-59] 30 (11/22 1200) BP: (100-159)/(69-107) 159/107 (11/22 1200) SpO2:  [85 %-97 %] 90 % (11/22 1200) Weight:  [76.2 kg-88.4 kg] 88.4 kg (11/22 0500) Last BM Date: 09/27/20  Intake/Output from previous day: 11/21 0701 - 11/22 0700 In: 5342.2 [P.O.:10; I.V.:2896.6; IV Piggyback:2435.6] Out: 1100 [Urine:1100] Intake/Output this shift: Total I/O In: 1550.2 [I.V.:985.5; NG/GT:150; IV Piggyback:414.7] Out: 160 [Urine:160]  PE: General: pleasant, WD, thin male who is lethargic but arouses easily  HEENT: aclera anicteric Heart: sinus tachycardia in the 110s Lungs: CTAB, no wheezes, rhonchi, or rales noted.  Respiratory effort nonlabored Abd: soft, NT, distended, BS hypoactive MS: all 4 extremities are symmetrical with no cyanosis, clubbing, or edema.     Lab Results:  Recent Labs    09/28/20 0757 09/29/20 0110  WBC 15.5* 12.6*  HGB 15.2 11.6*  HCT 45.4 33.7*  PLT 97* 96*   BMET Recent Labs    09/28/20 0757 09/29/20 0110  NA 133* 136  K 3.9 3.6  CL 100 106  CO2 21* 21*  GLUCOSE 239* 131*  BUN 16 11  CREATININE 1.33* 1.05  CALCIUM 8.2* 7.8*   PT/INR No results for input(s): LABPROT, INR in the last 72 hours. CMP     Component Value Date/Time   NA 136 09/29/2020 0110   K 3.6 09/29/2020 0110   CL 106 09/29/2020 0110   CO2 21 (L) 09/29/2020 0110   GLUCOSE 131 (H) 09/29/2020 0110   BUN 11 09/29/2020 0110   CREATININE 1.05 09/29/2020 0110   CREATININE 1.13 07/03/2019 1435   CALCIUM 7.8 (L) 09/29/2020 0110   PROT 5.3 (L) 09/29/2020 0110   ALBUMIN 2.0 (L) 09/29/2020 0110   AST 38 09/29/2020 0110   ALT 29 09/29/2020 0110     ALKPHOS 46 09/29/2020 0110   BILITOT 2.1 (H) 09/29/2020 0110   GFRNONAA >60 09/29/2020 0110   GFRNONAA 85 07/03/2019 1435   GFRAA 98 07/03/2019 1435   Lipase     Component Value Date/Time   LIPASE 549 (H) 09/27/2020 0818       Studies/Results: CT ABDOMEN W CONTRAST  Result Date: 09/28/2020 CLINICAL DATA:  Abdominal pain, fever, follow-up pancreatitis, concern for necrosis EXAM: CT ABDOMEN WITH CONTRAST TECHNIQUE: Multidetector CT imaging of the abdomen was performed using the standard protocol following bolus administration of intravenous contrast. CONTRAST:  OMNIPAQUE IOHEXOL 300 MG/ML  SOLN COMPARISON:  09/26/2020 FINDINGS: Lower chest: New moderate bilateral pleural effusions and associated atelectasis or consolidation. Hepatobiliary: No focal liver abnormality is seen. No gallstones, gallbladder wall thickening, or biliary dilatation. Pancreas: Redemonstrated extensive inflammatory stranding about the pancreas and adjacent structures, with an interval increase in retroperitoneal inflammatory fluid. There is new hypoenhancement of the majority of the pancreatic body and tail, involving approximately the distal 9.2 cm of the pancreas (series 3, image 27). There is a new fluid collection of the anterior pancreatic head measuring 3.2 x 2.9 cm (series 3, image 37). The pancreatic duct cannot be distinctly visualized. Spleen: Normal in size without focal abnormality. Adrenals/Urinary Tract: Adrenal glands are unremarkable. Kidneys are normal, without renal calculi, focal lesion, or hydronephrosis. Bladder is unremarkable.  Stomach/Bowel: Stomach is within normal limits. Appendix appears normal. No evidence of bowel wall thickening, distention, or inflammatory changes. Vascular/Lymphatic: No significant vascular findings are present. No enlarged abdominal or pelvic lymph nodes. Other: No abdominal wall hernia or abnormality. New small volume perihepatic and perisplenic ascites.  Musculoskeletal: No acute or significant osseous findings. IMPRESSION: 1. Redemonstrated extensive inflammatory stranding about the pancreas and adjacent structures, with an interval increase in retroperitoneal inflammatory fluid. 2. There is new hypoenhancement of the majority of the pancreatic body and tail, involving approximately the distal 9.2 cm of the pancreas. 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 3. There is a new fluid collection of the anterior pancreatic head measuring 3.2 x 2.9 cm, consistent with developing pseudocyst. 4. New small volume ascites. 5. New moderate bilateral pleural effusions and associated atelectasis or consolidation. Electronically Signed   By: Lauralyn PrimesAlex  Bibbey M.D.   On: 09/28/2020 11:03   DG Chest Port 1 View  Result Date: 09/28/2020 CLINICAL DATA:  Respiratory distress EXAM: PORTABLE CHEST 1 VIEW COMPARISON:  09/27/2020, CT 09/28/2020 FINDINGS: Low lung volume. Small left-sided pleural effusion, CT demonstrated right pleural effusion not well seen on radiograph. Increased airspace disease at the left lung base. Stable cardiomediastinal silhouette with borderline cardiac enlargement. No pneumothorax. IMPRESSION: Low lung volumes. Small left-sided pleural effusion with increased airspace disease at the left lung base, atelectasis versus pneumonia. CT demonstrated right pleural effusion not well seen radiographically. Electronically Signed   By: Jasmine PangKim  Fujinaga M.D.   On: 09/28/2020 15:52   DG Abd Portable 1V  Result Date: 09/29/2020 CLINICAL DATA:  Feeding tube placement EXAM: PORTABLE ABDOMEN - 1 VIEW COMPARISON:  None. FINDINGS: Feeding tube is in place with the tip in the transverse duodenum. IMPRESSION: Feeding tube tip in the transverse duodenum. Electronically Signed   By: Charlett NoseKevin  Dover M.D.   On: 09/29/2020 10:16   ECHOCARDIOGRAM COMPLETE  Result Date: 09/28/2020    ECHOCARDIOGRAM REPORT   Patient Name:   Carlos [name]  Date of Exam: 09/28/2020 Medical Rec #:   [id number]  Height:       77.0 in Accession #:    [id number] Weight:       176.5 lb Date of Birth:  02/25/1986   BSA:          2.121 m Patient Age:    34 years   BP:           148/105 mmHg Patient Gender: M          HR:           120 bpm. Exam Location:  Inpatient Procedure: 2D Echo, Cardiac Doppler and Color Doppler Indications:    ; R00.0 Tachycardia  History:        Patient has no prior history of Echocardiogram examinations.                 Arrythmias:PAC; Signs/Symptoms:Bacteremia and Altered Mental                 Status. ETOH. Delirium. Pancreatitis.  Sonographer:    Sheralyn Boatmanina West RDCS Referring Phys: [phone] EKTA V PATEL  Sonographer Comments: Suboptimal subcostal window and Technically difficult study due to poor echo windows. Patient had hiccups througout exam. Could not follow directions. IMPRESSIONS  1. Left ventricular ejection fraction, by estimation, is 60 to 65%. The left ventricle has normal function. The left ventricle has no regional wall motion abnormalities. There is mild left ventricular hypertrophy. Left ventricular diastolic parameters were normal.  2. Right  ventricular systolic function is normal. The right ventricular size is normal.  3. The mitral valve is normal in structure. No evidence of mitral valve regurgitation. No evidence of mitral stenosis.  4. The aortic valve is tricuspid. Aortic valve regurgitation is not visualized. No aortic stenosis is present.  5. The inferior vena cava is normal in size with greater than 50% respiratory variability, suggesting right atrial pressure of 3 mmHg. FINDINGS  Left Ventricle: Left ventricular ejection fraction, by estimation, is 60 to 65%. The left ventricle has normal function. The left ventricle has no regional wall motion abnormalities. The left ventricular internal cavity size was normal in size. There is  mild left ventricular hypertrophy. Left ventricular diastolic parameters were normal. Right Ventricle: The right ventricular size is normal. No  increase in right ventricular wall thickness. Right ventricular systolic function is normal. Left Atrium: Left atrial size was normal in size. Right Atrium: Right atrial size was normal in size. Pericardium: There is no evidence of pericardial effusion. Mitral Valve: The mitral valve is normal in structure. No evidence of mitral valve regurgitation. No evidence of mitral valve stenosis. Tricuspid Valve: The tricuspid valve is normal in structure. Tricuspid valve regurgitation is mild . No evidence of tricuspid stenosis. Aortic Valve: The aortic valve is tricuspid. Aortic valve regurgitation is not visualized. No aortic stenosis is present. Pulmonic Valve: The pulmonic valve was normal in structure. Pulmonic valve regurgitation is not visualized. No evidence of pulmonic stenosis. Aorta: The aortic root is normal in size and structure. Venous: The inferior vena cava is normal in size with greater than 50% respiratory variability, suggesting right atrial pressure of 3 mmHg. IAS/Shunts: No atrial level shunt detected by color flow Doppler.  LEFT VENTRICLE PLAX 2D LVIDd:         4.30 cm      Diastology LVIDs:         3.20 cm      LV e' medial:    15.80 cm/s LV PW:         1.20 cm      LV E/e' medial:  3.6 LV IVS:        1.40 cm      LV e' lateral:   9.03 cm/s LVOT diam:     2.30 cm      LV E/e' lateral: 6.2 LV SV:         56 LV SV Index:   26 LVOT Area:     4.15 cm  LV Volumes (MOD) LV vol d, MOD A2C: 62.1 ml LV vol d, MOD A4C: 109.0 ml LV vol s, MOD A2C: 20.5 ml LV vol s, MOD A4C: 47.3 ml LV SV MOD A2C:     41.6 ml LV SV MOD A4C:     109.0 ml LV SV MOD BP:      50.7 ml RIGHT VENTRICLE RV S prime:     20.80 cm/s TAPSE (M-mode): 1.9 cm LEFT ATRIUM             Index       RIGHT ATRIUM           Index LA diam:        3.60 cm 1.70 cm/m  RA Area:     12.10 cm LA Vol (A2C):   18.9 ml 8.91 ml/m  RA Volume:   27.50 ml  12.96 ml/m LA Vol (A4C):   26.6 ml 12.54 ml/m LA Biplane Vol: 22.7 ml 10.70 ml/m  AORTIC VALVE  PULMONIC VALVE LVOT Vmax:   102.00 cm/s PR End Diast Vel: 1.99 msec LVOT Vmean:  61.700 cm/s LVOT VTI:    0.134 m  AORTA Ao Root diam: 3.40 cm Ao Asc diam:  3.00 cm MITRAL VALVE MV Area (PHT): 7.44 cm    SHUNTS MV Decel Time: 102 msec    Systemic VTI:  0.13 m MV E velocity: 56.20 cm/s  Systemic Diam: 2.30 cm MV A velocity: 76.40 cm/s MV E/A ratio:  0.74 Charlton Haws MD Electronically signed by Charlton Haws MD Signature Date/Time: 09/28/2020/11:17:52 AM    Final     Anti-infectives: Anti-infectives (From admission, onward)   Start     Dose/Rate Route Frequency Ordered Stop   09/28/20 2000  piperacillin-tazobactam (ZOSYN) IVPB 3.375 g        3.375 g 12.5 mL/hr over 240 Minutes Intravenous Every 8 hours 09/28/20 1417 10/04/20 2359   09/28/20 1500  meropenem (MERREM) 1 g in sodium chloride 0.9 % 100 mL IVPB  Status:  Discontinued        1 g 200 mL/hr over 30 Minutes Intravenous Every 8 hours 09/28/20 1412 09/28/20 1417   09/28/20 1015  linezolid (ZYVOX) IVPB 600 mg  Status:  Discontinued        600 mg 300 mL/hr over 60 Minutes Intravenous Every 12 hours 09/28/20 0917 09/28/20 1412   09/27/20 1200  piperacillin-tazobactam (ZOSYN) IVPB 3.375 g  Status:  Discontinued        3.375 g 100 mL/hr over 30 Minutes Intravenous Every 6 hours 09/27/20 0934 09/27/20 0940   09/27/20 1030  piperacillin-tazobactam (ZOSYN) IVPB 3.375 g  Status:  Discontinued        3.375 g 12.5 mL/hr over 240 Minutes Intravenous Every 8 hours 09/27/20 0940 09/28/20 1412       Assessment/Plan EtOH abuse   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 - Tbili and LFTs downtrending today, continue to monitor  - Foley catheter for strict I/O monitoring - Continued IVF - GI following as well - Remain NPO - Post-duodenal Dopoff tube placement to establish enteric feeding access, would start tube feeding once established - trickle feeds  - no indication for surgical intervention  - patient  does not need continued abx from a surgical standpoint unless concern for cholangitis   FEN: TF to start today, would start with trickle feeds given distention and lack of bowel function  VTE: lovenox ID: linezolid/merrem 11/21; Zosyn 11/20>>  LOS: 3 days    Juliet Rude , [facility] Surgery 09/29/2020, 12:04 PM Please see Amion for pager number during day hours 7:00am-4:30pm

## 2020-09-29 NOTE — Progress Notes (Signed)
Initial Nutrition Assessment  DOCUMENTATION CODES:   Not applicable  INTERVENTION:   If jejunal placement is desired, Cortrak tube tip needs to be advanced prior to initiation of TF  Tube Feeding via Cortak (tip in transverse duodenum): Vital 1.5 at 65 ml/hr Begin TF at 20 ml/hr; titrate by 10 mL q 8 hours until goal rate of 65 ml/hr Pro-Source TF 45 mL BID TF at goal rate provides 2420 kcals, 127 g of protein and 1186 mL of free water  If pt does not tolerate TF with current placement, recommend advancing tube past the LOT  NUTRITION DIAGNOSIS:   Inadequate oral intake related to altered GI function, acute illness as evidenced by NPO status, per patient/family report.   GOAL:   Patient will meet greater than or equal to 90% of their needs   MONITOR:   TF tolerance, Diet advancement, Weight trends, Labs  REASON FOR ASSESSMENT:   Rounds    ASSESSMENT:   34 yo male admitted with acute necrotizing pancreatitis, AKI. PMH includes EtOH abuse   11/21 CT abd with new findings of pancreatic necrosis, developing pseudocyst and new small volume ascites  NPO day 3. Per report, pt has not eaten anything since 11/18; on that day, he ate very little due to significant pain  Cortak placed today; per abd xray, tube tip in transverse duodenum. If LOT placement is desired, tube needs advancement.   Current wt 88.4 kg; noted admission weight of 80 kg, weight of 76 kg yesterday.  Labs: reviewed; phosphorus 1.6 (L) Meds: potassium phosphate,MVI, thiamine, ss novolog, folic acid   Diet Order:   Diet Order    None      EDUCATION NEEDS:   Not appropriate for education at this time  Skin:  Skin Assessment: Reviewed RN Assessment  Last BM:  PTA  Height:   Ht Readings from Last 1 Encounters:  09/26/20 6\' 5"  (1.956 m)    Weight:   Wt Readings from Last 1 Encounters:  09/29/20 88.4 kg    BMI:  Body mass index is 23.11 kg/m.  Estimated Nutritional Needs:   Kcal:   2400-2600 kcals  Protein:  120-135 g  Fluid:  >/= 2.2 L   10/01/20 MS, RDN, LDN, CNSC Registered Dietitian III Clinical Nutrition RD Pager and On-Call Pager Number Located in [place]

## 2020-09-29 NOTE — Progress Notes (Signed)
eLink Physician-Brief Progress Note Patient Name: Carlos [name] DOB: 16-Nov-1985 MRN: [id number]   Date of Service  09/29/2020  HPI/Events of Note  Agitation - Request for bilateral soft wrist restraints.  eICU Interventions  Plan: 1. Bilateral soft wrist restraints X 11 hours.      Intervention Category Major Interventions: Delirium, psychosis, severe agitation - evaluation and management  [name] 09/29/2020, 11:37 PM

## 2020-09-29 NOTE — Progress Notes (Signed)
Tenuous resp status through day, still maintaining airway but likely progressing to ARDS/intubation.  Bedside US with minimal effusions and bilateral volume loss due to poor inspiratory effort.  Will watch closely and let night team know has potential to decompensate further.  Myrla Halsted MD PCCM

## 2020-09-29 NOTE — Progress Notes (Signed)
Per surgery only postpyloric feeds can be given. Will DC oral liquids and see if RD can get postpyloric dobhoff in.  Myrla Halsted MD PCCM

## 2020-09-29 NOTE — Progress Notes (Signed)
Pt has pulled his mittens off three time since the start of shift and is continuously trying to remove his oxygen and IVF tubing. Pt is alert and oriented but does not understand how sick he is and why he still needs to be in the hospital in the ICU. Precedex still ordered on the [facility] and restarted for agitation/anxiety. Will continue to monitor closely.  Caswell Corwin, RN 09/29/20 10:12 PM

## 2020-09-30 ENCOUNTER — Inpatient Hospital Stay: Payer: Self-pay

## 2020-09-30 ENCOUNTER — Inpatient Hospital Stay (HOSPITAL_COMMUNITY): Payer: Medicaid Other

## 2020-09-30 DIAGNOSIS — Z9289 Personal history of other medical treatment: Secondary | ICD-10-CM

## 2020-09-30 DIAGNOSIS — F10231 Alcohol dependence with withdrawal delirium: Secondary | ICD-10-CM

## 2020-09-30 LAB — GLUCOSE, CAPILLARY
Glucose-Capillary: 123 mg/dL — ABNORMAL HIGH (ref 70–99)
Glucose-Capillary: 139 mg/dL — ABNORMAL HIGH (ref 70–99)
Glucose-Capillary: 186 mg/dL — ABNORMAL HIGH (ref 70–99)
Glucose-Capillary: 164 mg/dL — ABNORMAL HIGH (ref 70–99)
Glucose-Capillary: 185 mg/dL — ABNORMAL HIGH (ref 70–99)
Glucose-Capillary: 218 mg/dL — ABNORMAL HIGH (ref 70–99)

## 2020-09-30 LAB — BODY FLUID CELL COUNT WITH DIFFERENTIAL
Eos, Fluid: 0 %
Lymphs, Fluid: 0 %
Monocyte-Macrophage-Serous Fluid: 4 % — ABNORMAL LOW (ref 50–90)
Neutrophil Count, Fluid: 96 % — ABNORMAL HIGH (ref 0–25)
Total Nucleated Cell Count, Fluid: 5275 cu mm — ABNORMAL HIGH (ref 0–1000)

## 2020-09-30 LAB — LACTATE DEHYDROGENASE, PLEURAL OR PERITONEAL FLUID: LD, Fluid: 573 U/L — ABNORMAL HIGH (ref 3–23)

## 2020-09-30 LAB — POCT I-STAT 7, (LYTES, BLD GAS, ICA,H+H)
Acid-Base Excess: 2 mmol/L (ref 0.0–2.0)
Bicarbonate: 25.4 mmol/L (ref 20.0–28.0)
Calcium, Ion: 1.15 mmol/L (ref 1.15–1.40)
HCT: 35 % — ABNORMAL LOW (ref 39.0–52.0)
Hemoglobin: 11.9 g/dL — ABNORMAL LOW (ref 13.0–17.0)
O2 Saturation: 96 %
Patient temperature: 98.3
Potassium: 3.7 mmol/L (ref 3.5–5.1)
Sodium: 134 mmol/L — ABNORMAL LOW (ref 135–145)
TCO2: 26 mmol/L (ref 22–32)
pCO2 arterial: 32.6 mmHg (ref 32.0–48.0)
pH, Arterial: 7.499 — ABNORMAL HIGH (ref 7.350–7.450)
pO2, Arterial: 71 mmHg — ABNORMAL LOW (ref 83.0–108.0)

## 2020-09-30 LAB — GRAM STAIN

## 2020-09-30 LAB — COMPREHENSIVE METABOLIC PANEL
ALT: 22 U/L (ref 0–44)
AST: 26 U/L (ref 15–41)
Albumin: 1.6 g/dL — ABNORMAL LOW (ref 3.5–5.0)
Alkaline Phosphatase: 53 U/L (ref 38–126)
Anion gap: 11 (ref 5–15)
BUN: 9 mg/dL (ref 6–20)
CO2: 20 mmol/L — ABNORMAL LOW (ref 22–32)
Calcium: 7.7 mg/dL — ABNORMAL LOW (ref 8.9–10.3)
Chloride: 103 mmol/L (ref 98–111)
Creatinine, Ser: 0.77 mg/dL (ref 0.61–1.24)
GFR, Estimated: >60 mL/min (ref >60)
Glucose, Bld: 112 mg/dL — ABNORMAL HIGH (ref 70–99)
Potassium: 3.7 mmol/L (ref 3.5–5.1)
Sodium: 134 mmol/L — ABNORMAL LOW (ref 135–145)
Total Bilirubin: 1.5 mg/dL — ABNORMAL HIGH (ref 0.3–1.2)
Total Protein: 4 g/dL — ABNORMAL LOW (ref 6.5–8.1)

## 2020-09-30 LAB — CBC
HCT: 29.7 % — ABNORMAL LOW (ref 39.0–52.0)
Hemoglobin: 10.1 g/dL — ABNORMAL LOW (ref 13.0–17.0)
MCH: 36.9 pg — ABNORMAL HIGH (ref 26.0–34.0)
MCHC: 34 g/dL (ref 30.0–36.0)
MCV: 108.4 fL — ABNORMAL HIGH (ref 80.0–100.0)
Platelets: 113 10*3/uL — ABNORMAL LOW (ref 150–400)
RBC: 2.74 MIL/uL — ABNORMAL LOW (ref 4.22–5.81)
RDW: 12.8 % (ref 11.5–15.5)
WBC: 13.6 10*3/uL — ABNORMAL HIGH (ref 4.0–10.5)
nRBC: 0 % (ref 0.0–0.2)

## 2020-09-30 LAB — PHOSPHORUS
Phosphorus: 2.2 mg/dL — ABNORMAL LOW (ref 2.5–4.6)
Phosphorus: 3 mg/dL (ref 2.5–4.6)

## 2020-09-30 LAB — PROTEIN, PLEURAL OR PERITONEAL FLUID: Total protein, fluid: <3 g/dL

## 2020-09-30 LAB — AMYLASE, PLEURAL OR PERITONEAL FLUID: Amylase, Fluid: 373 U/L

## 2020-09-30 LAB — MAGNESIUM
Magnesium: 1.5 mg/dL — ABNORMAL LOW (ref 1.7–2.4)
Magnesium: 2.5 mg/dL — ABNORMAL HIGH (ref 1.7–2.4)

## 2020-09-30 LAB — TRIGLYCERIDES: Triglycerides: 192 mg/dL — ABNORMAL HIGH (ref <150)

## 2020-09-30 MED ORDER — FENTANYL CITRATE (PF) 100 MCG/2ML IJ SOLN: 100.0000 ug | Freq: Once | INTRAMUSCULAR | Status: AC

## 2020-09-30 MED ORDER — ROCURONIUM BROMIDE 50 MG/5ML IV SOLN
80.0000 mg | Freq: Once | INTRAVENOUS | Status: AC
  Administered 2020-09-30: 80 mg via INTRAVENOUS

## 2020-09-30 MED ORDER — MAGNESIUM SULFATE 4 GM/100ML IV SOLN
4.0000 g | Freq: Once | INTRAVENOUS | Status: AC
  Administered 2020-09-30: 4 g via INTRAVENOUS
  Filled 2020-09-30: qty 100

## 2020-09-30 MED ORDER — SODIUM CHLORIDE 0.9% FLUSH
10.0000 mL | Freq: Two times a day (BID) | INTRAVENOUS | Status: DC
  Administered 2020-09-30 – 2020-10-02 (×4): 10 mL
  Administered 2020-10-03: 40 mL
  Administered 2020-10-04 – 2020-10-05 (×4): 10 mL
  Administered 2020-10-06: 40 mL
  Administered 2020-10-06 – 2020-10-07 (×3): 10 mL

## 2020-09-30 MED ORDER — ORAL CARE MOUTH RINSE
15.0000 mL | OROMUCOSAL | Status: DC
  Administered 2020-10-01 – 2020-10-02 (×15): 15 mL via OROMUCOSAL

## 2020-09-30 MED ORDER — ALBUMIN HUMAN 25 % IV SOLN
25.0000 g | Freq: Four times a day (QID) | INTRAVENOUS | Status: AC
  Administered 2020-09-30 – 2020-10-01 (×4): 25 g via INTRAVENOUS
  Filled 2020-09-30 (×4): qty 100

## 2020-09-30 MED ORDER — FENTANYL 2500MCG IN NS 250ML (10MCG/ML) PREMIX INFUSION
0.0000 ug/h | INTRAVENOUS | Status: DC
  Administered 2020-09-30: 25 ug/h via INTRAVENOUS
  Administered 2020-10-01: 400 ug/h via INTRAVENOUS
  Administered 2020-10-01: 300 ug/h via INTRAVENOUS
  Administered 2020-10-01: 400 ug/h via INTRAVENOUS
  Administered 2020-10-01: 300 ug/h via INTRAVENOUS
  Administered 2020-10-02: 400 ug/h via INTRAVENOUS
  Filled 2020-09-30 (×6): qty 250

## 2020-09-30 MED ORDER — CHLORHEXIDINE GLUCONATE 0.12% ORAL RINSE (MEDLINE KIT)
15.0000 mL | Freq: Two times a day (BID) | OROMUCOSAL | Status: DC
  Administered 2020-09-30 – 2020-10-05 (×8): 15 mL via OROMUCOSAL

## 2020-09-30 MED ORDER — PROPOFOL 1000 MG/100ML IV EMUL
5.0000 ug/kg/min | INTRAVENOUS | Status: DC
  Administered 2020-09-30 – 2020-10-01 (×2): 50 ug/kg/min via INTRAVENOUS
  Administered 2020-10-01: 60 ug/kg/min via INTRAVENOUS
  Administered 2020-10-01 (×4): 50 ug/kg/min via INTRAVENOUS
  Administered 2020-10-02: 80 ug/kg/min via INTRAVENOUS
  Administered 2020-10-02: 60 ug/kg/min via INTRAVENOUS
  Filled 2020-09-30 (×7): qty 100
  Filled 2020-09-30: qty 200
  Filled 2020-09-30: qty 100
  Filled 2020-09-30: qty 200

## 2020-09-30 MED ORDER — PROPOFOL 1000 MG/100ML IV EMUL
INTRAVENOUS | Status: AC
  Administered 2020-09-30: 5 ug/kg/min via INTRAVENOUS
  Filled 2020-09-30: qty 100

## 2020-09-30 MED ORDER — ETOMIDATE 2 MG/ML IV SOLN
INTRAVENOUS | Status: AC
  Administered 2020-09-30: 20 mg via INTRAVENOUS
  Filled 2020-09-30: qty 20

## 2020-09-30 MED ORDER — FENTANYL CITRATE (PF) 100 MCG/2ML IJ SOLN
INTRAMUSCULAR | Status: AC
  Administered 2020-09-30: 100 ug via INTRAVENOUS
  Filled 2020-09-30: qty 2

## 2020-09-30 MED ORDER — MIDAZOLAM HCL 2 MG/2ML IJ SOLN: 4.0000 mg | Freq: Once | INTRAMUSCULAR | Status: AC

## 2020-09-30 MED ORDER — POTASSIUM PHOSPHATES 15 MMOLE/5ML IV SOLN
15.0000 mmol | Freq: Once | INTRAVENOUS | Status: AC
  Administered 2020-09-30: 15 mmol via INTRAVENOUS
  Filled 2020-09-30: qty 5

## 2020-09-30 MED ORDER — SODIUM CHLORIDE 0.9% FLUSH
10.0000 mL | Freq: Three times a day (TID) | INTRAVENOUS | Status: DC
  Administered 2020-09-30 – 2020-10-06 (×16): 10 mL

## 2020-09-30 MED ORDER — ETOMIDATE 2 MG/ML IV SOLN: 20.0000 mg | Freq: Once | INTRAVENOUS | Status: AC

## 2020-09-30 MED ORDER — SODIUM CHLORIDE 0.9% FLUSH: 10.0000 mL | INTRAVENOUS | Status: DC | PRN

## 2020-09-30 MED ORDER — MIDAZOLAM HCL 2 MG/2ML IJ SOLN
INTRAMUSCULAR | Status: AC
  Administered 2020-09-30: 4 mg via INTRAVENOUS
  Filled 2020-09-30: qty 4

## 2020-09-30 MED ORDER — ROCURONIUM BROMIDE 10 MG/ML (PF) SYRINGE
PREFILLED_SYRINGE | INTRAVENOUS | Status: AC
  Administered 2020-09-30: 80 mg
  Filled 2020-09-30: qty 10

## 2020-09-30 NOTE — Progress Notes (Signed)
Progress Note     Subjective: Patient still lethargic this AM but answers questions and follows commands. Able to tell me that we are in [place] and in a hospital, able to tell me the year and knows he is here because he was having pain but not that he has pancreatitis. He reports passing flatus. Denies nausea. Denies abdominal pain this AM.   Objective: Vital signs in last 24 hours: Temp:  [97.6 F (36.4 C)-98.9 F (37.2 C)] 97.8 F (36.6 C) (11/23 0718) Pulse Rate:  [77-110] 98 (11/23 0800) Resp:  [20-39] 37 (11/23 0800) BP: (110-176)/(75-148) 169/101 (11/23 0800) SpO2:  [83 %-100 %] 94 % (11/23 0800) FiO2 (%):  [100 %] 100 % (11/22 2120) Last BM Date: 09/30/20  Intake/Output from previous day: 11/22 0701 - 11/23 0700 In: 5811.4 [I.V.:4752.6; NG/GT:402.5; IV Piggyback:656.3] Out: 1565 [Urine:1565] Intake/Output this shift: Total I/O In: 217.3 [I.V.:206.1; IV Piggyback:11.2] Out: 875 [Urine:275; Stool:600]  PE: General: pleasant, WD, thin male who is lethargic but arouses easily  HEENT: aclera anicteric Heart: sinus tachycardia in the 110s Lungs: CTAB, no wheezes, rhonchi, or rales noted.  Respiratory effort nonlabored Abd: soft, NT, distended, BS hypoactive MS: all 4 extremities are symmetrical with no cyanosis, clubbing, or edema. Psych: A&Ox3    Lab Results:  Recent Labs    09/29/20 0110 09/30/20 0430  WBC 12.6* 13.6*  HGB 11.6* 10.1*  HCT 33.7* 29.7*  PLT 96* 113*   BMET Recent Labs    09/29/20 0110 09/30/20 0430  NA 136 134*  K 3.6 3.7  CL 106 103  CO2 21* 20*  GLUCOSE 131* 112*  BUN 11 9  CREATININE 1.05 0.77  CALCIUM 7.8* 7.7*   PT/INR No results for input(s): LABPROT, INR in the last 72 hours. CMP     Component Value Date/Time   NA 134 (L) 09/30/2020 0430   K 3.7 09/30/2020 0430   CL 103 09/30/2020 0430   CO2 20 (L) 09/30/2020 0430   GLUCOSE 112 (H) 09/30/2020 0430   BUN 9 09/30/2020 0430   CREATININE 0.77 09/30/2020 0430    CREATININE 1.13 07/03/2019 1435   CALCIUM 7.7 (L) 09/30/2020 0430   PROT 4.0 (L) 09/30/2020 0430   ALBUMIN 1.6 (L) 09/30/2020 0430   AST 26 09/30/2020 0430   ALT 22 09/30/2020 0430   ALKPHOS 53 09/30/2020 0430   BILITOT 1.5 (H) 09/30/2020 0430   GFRNONAA >60 09/30/2020 0430   GFRNONAA 85 07/03/2019 1435   GFRAA 98 07/03/2019 1435   Lipase     Component Value Date/Time   LIPASE 549 (H) 09/27/2020 0818       Studies/Results: DG Chest 1 View  Result Date: 09/29/2020 CLINICAL DATA:  Hypoxia EXAM: CHEST  1 VIEW COMPARISON:  09/28/2020 FINDINGS: A feeding tube is been placed and has enter the stomach. Low lung volumes are present, causing crowding of the pulmonary vasculature. Mild enlargement of the cardiopericardial silhouette noted with airspace opacities along both lung bases. These could be from atelectasis or pneumonia. Blunted left costophrenic angle, pleural effusion not excluded. There is mild rightward deviation of the trachea, some degree of atelectasis medially in the right upper lobe is a distinct possibility. Follow up imaging to ensure clearance and exclude the possibility of a central obstructing lesion is recommended. IMPRESSION: 1. Bibasilar airspace opacities may be from atelectasis or pneumonia. 2. Mild enlargement of the cardiopericardial silhouette. 3. Potential small left pleural effusion. 4. Medial right upper lobe atelectasis, follow up imaging with attention  to this region is recommended to exclude a central obstructing lesion. Electronically Signed   By: Gaylyn RongWalter  Liebkemann M.D.   On: 09/29/2020 14:21   CT ABDOMEN W CONTRAST  Result Date: 09/28/2020 CLINICAL DATA:  Abdominal pain, fever, follow-up pancreatitis, concern for necrosis EXAM: CT ABDOMEN WITH CONTRAST TECHNIQUE: Multidetector CT imaging of the abdomen was performed using the standard protocol following bolus administration of intravenous contrast. CONTRAST:  100mL OMNIPAQUE IOHEXOL 300 MG/ML  SOLN  COMPARISON:  09/26/2020 FINDINGS: Lower chest: New moderate bilateral pleural effusions and associated atelectasis or consolidation. Hepatobiliary: No focal liver abnormality is seen. No gallstones, gallbladder wall thickening, or biliary dilatation. Pancreas: Redemonstrated extensive inflammatory stranding about the pancreas and adjacent structures, with an interval increase in retroperitoneal inflammatory fluid. There is new hypoenhancement of the majority of the pancreatic body and tail, involving approximately the distal 9.2 cm of the pancreas (series 3, image 27). There is a new fluid collection of the anterior pancreatic head measuring 3.2 x 2.9 cm (series 3, image 37). The pancreatic duct cannot be distinctly visualized. Spleen: Normal in size without focal abnormality. Adrenals/Urinary Tract: Adrenal glands are unremarkable. Kidneys are normal, without renal calculi, focal lesion, or hydronephrosis. Bladder is unremarkable. Stomach/Bowel: Stomach is within normal limits. Appendix appears normal. No evidence of bowel wall thickening, distention, or inflammatory changes. Vascular/Lymphatic: No significant vascular findings are present. No enlarged abdominal or pelvic lymph nodes. Other: No abdominal wall hernia or abnormality. New small volume perihepatic and perisplenic ascites. Musculoskeletal: No acute or significant osseous findings. IMPRESSION: 1. Redemonstrated extensive inflammatory stranding about the pancreas and adjacent structures, with an interval increase in retroperitoneal inflammatory fluid. 2. There is new hypoenhancement of the majority of the pancreatic body and tail, involving approximately the distal 9.2 cm of the pancreas. 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 3. There is a new fluid collection of the anterior pancreatic head measuring 3.2 x 2.9 cm, consistent with developing pseudocyst. 4. New small volume ascites. 5. New moderate bilateral pleural effusions and associated  atelectasis or consolidation. Electronically Signed   By: Lauralyn PrimesAlex  Bibbey M.D.   On: 09/28/2020 11:03   US RENAL  Result Date: 09/29/2020 CLINICAL DATA:  Acute kidney injury EXAM: RENAL / URINARY TRACT ULTRASOUND COMPLETE COMPARISON:  CT 09/28/2020 FINDINGS: Right Kidney: Renal measurements: 10 x 5.6 x 5.7 cm = volume: 167.7 mL. Echogenicity within normal limits. No mass or hydronephrosis visualized. Left Kidney: Renal measurements: 10.5 x 7.1 x 5.8 cm = volume: 226.4 mL. Echogenicity within normal limits. No mass or hydronephrosis visualized. Bladder: Foley catheter in the decompressed urinary bladder. Other: Small amount of abdominal ascites. IMPRESSION: 1. Ultrasound appearance of the kidneys is within normal limits. 2. Small amount of ascites Electronically Signed   By: Jasmine PangKim  Fujinaga M.D.   On: 09/29/2020 21:39   DG Chest Port 1 View  Result Date: 09/28/2020 CLINICAL DATA:  Respiratory distress EXAM: PORTABLE CHEST 1 VIEW COMPARISON:  09/27/2020, CT 09/28/2020 FINDINGS: Low lung volume. Small left-sided pleural effusion, CT demonstrated right pleural effusion not well seen on radiograph. Increased airspace disease at the left lung base. Stable cardiomediastinal silhouette with borderline cardiac enlargement. No pneumothorax. IMPRESSION: Low lung volumes. Small left-sided pleural effusion with increased airspace disease at the left lung base, atelectasis versus pneumonia. CT demonstrated right pleural effusion not well seen radiographically. Electronically Signed   By: Jasmine PangKim  Fujinaga M.D.   On: 09/28/2020 15:52   DG Abd Portable 1V  Result Date: 09/29/2020 CLINICAL DATA:  34 year old male with  feeding tube placement. EXAM: PORTABLE ABDOMEN - 1 VIEW COMPARISON:  Abdominal radiograph dated 09/29/2020. FINDINGS: Interval advancement of the feeding tube with tip in the distal duodenum close to the expected location of the ligament of Treitz. Mildly dilated loops of small bowel measure up to 4.5 cm in the  right lower quadrant. IMPRESSION: Interval advancement of the feeding tube with tip in the distal duodenum. Electronically Signed   By: Elgie Collard M.D.   On: 09/29/2020 15:38   DG Abd Portable 1V  Result Date: 09/29/2020 CLINICAL DATA:  Feeding tube placement EXAM: PORTABLE ABDOMEN - 1 VIEW COMPARISON:  None. FINDINGS: Feeding tube is in place with the tip in the transverse duodenum. IMPRESSION: Feeding tube tip in the transverse duodenum. Electronically Signed   By: Charlett Nose M.D.   On: 09/29/2020 10:16   ECHOCARDIOGRAM COMPLETE  Result Date: 09/28/2020    ECHOCARDIOGRAM REPORT   Patient Name:   Carlos [name]  Date of Exam: 09/28/2020 Medical Rec #:  [id number]  Height:       77.0 in Accession #:    [id number] Weight:       176.5 lb Date of Birth:  Sep 08, 1986   BSA:          2.121 m Patient Age:    34 years   BP:           148/105 mmHg Patient Gender: M          HR:           120 bpm. Exam Location:  Inpatient Procedure: 2D Echo, Cardiac Doppler and Color Doppler Indications:    ; R00.0 Tachycardia  History:        Patient has no prior history of Echocardiogram examinations.                 Arrythmias:PAC; Signs/Symptoms:Bacteremia and Altered Mental                 Status. ETOH. Delirium. Pancreatitis.  Sonographer:    Sheralyn Boatman RDCS Referring Phys: [phone] EKTA V PATEL  Sonographer Comments: Suboptimal subcostal window and Technically difficult study due to poor echo windows. Patient had hiccups througout exam. Could not follow directions. IMPRESSIONS  1. Left ventricular ejection fraction, by estimation, is 60 to 65%. The left ventricle has normal function. The left ventricle has no regional wall motion abnormalities. There is mild left ventricular hypertrophy. Left ventricular diastolic parameters were normal.  2. Right ventricular systolic function is normal. The right ventricular size is normal.  3. The mitral valve is normal in structure. No evidence of mitral valve regurgitation. No evidence of  mitral stenosis.  4. The aortic valve is tricuspid. Aortic valve regurgitation is not visualized. No aortic stenosis is present.  5. The inferior vena cava is normal in size with greater than 50% respiratory variability, suggesting right atrial pressure of 3 mmHg. FINDINGS  Left Ventricle: Left ventricular ejection fraction, by estimation, is 60 to 65%. The left ventricle has normal function. The left ventricle has no regional wall motion abnormalities. The left ventricular internal cavity size was normal in size. There is  mild left ventricular hypertrophy. Left ventricular diastolic parameters were normal. Right Ventricle: The right ventricular size is normal. No increase in right ventricular wall thickness. Right ventricular systolic function is normal. Left Atrium: Left atrial size was normal in size. Right Atrium: Right atrial size was normal in size. Pericardium: There is no evidence of pericardial effusion. Mitral Valve: The mitral valve is  normal in structure. No evidence of mitral valve regurgitation. No evidence of mitral valve stenosis. Tricuspid Valve: The tricuspid valve is normal in structure. Tricuspid valve regurgitation is mild . No evidence of tricuspid stenosis. Aortic Valve: The aortic valve is tricuspid. Aortic valve regurgitation is not visualized. No aortic stenosis is present. Pulmonic Valve: The pulmonic valve was normal in structure. Pulmonic valve regurgitation is not visualized. No evidence of pulmonic stenosis. Aorta: The aortic root is normal in size and structure. Venous: The inferior vena cava is normal in size with greater than 50% respiratory variability, suggesting right atrial pressure of 3 mmHg. IAS/Shunts: No atrial level shunt detected by color flow Doppler.  LEFT VENTRICLE PLAX 2D LVIDd:         4.30 cm      Diastology LVIDs:         3.20 cm      LV e' medial:    15.80 cm/s LV PW:         1.20 cm      LV E/e' medial:  3.6 LV IVS:        1.40 cm      LV e' lateral:   9.03 cm/s  LVOT diam:     2.30 cm      LV E/e' lateral: 6.2 LV SV:         56 LV SV Index:   26 LVOT Area:     4.15 cm  LV Volumes (MOD) LV vol d, MOD A2C: 62.1 ml LV vol d, MOD A4C: 109.0 ml LV vol s, MOD A2C: 20.5 ml LV vol s, MOD A4C: 47.3 ml LV SV MOD A2C:     41.6 ml LV SV MOD A4C:     109.0 ml LV SV MOD BP:      50.7 ml RIGHT VENTRICLE RV S prime:     20.80 cm/s TAPSE (M-mode): 1.9 cm LEFT ATRIUM             Index       RIGHT ATRIUM           Index LA diam:        3.60 cm 1.70 cm/m  RA Area:     12.10 cm LA Vol (A2C):   18.9 ml 8.91 ml/m  RA Volume:   27.50 ml  12.96 ml/m LA Vol (A4C):   26.6 ml 12.54 ml/m LA Biplane Vol: 22.7 ml 10.70 ml/m  AORTIC VALVE             PULMONIC VALVE LVOT Vmax:   102.00 cm/s PR End Diast Vel: 1.99 msec LVOT Vmean:  61.700 cm/s LVOT VTI:    0.134 m  AORTA Ao Root diam: 3.40 cm Ao Asc diam:  3.00 cm MITRAL VALVE MV Area (PHT): 7.44 cm    SHUNTS MV Decel Time: 102 msec    Systemic VTI:  0.13 m MV E velocity: 56.20 cm/s  Systemic Diam: 2.30 cm MV A velocity: 76.40 cm/s MV E/A ratio:  0.74 Charlton Haws MD Electronically signed by Charlton Haws MD Signature Date/Time: 09/28/2020/11:17:52 AM    Final    [place] EKG SITE RITE  Result Date: 09/30/2020 If Site Rite image not attached, placement could not be confirmed due to current cardiac rhythm.   Anti-infectives: Anti-infectives (From admission, onward)   Start     Dose/Rate Route Frequency Ordered Stop   09/28/20 2000  piperacillin-tazobactam (ZOSYN) IVPB 3.375 g        3.375 g 12.5 mL/hr  over 240 Minutes Intravenous Every 8 hours 09/28/20 1417 10/04/20 2359   09/28/20 1500  meropenem (MERREM) 1 g in sodium chloride 0.9 % 100 mL IVPB  Status:  Discontinued        1 g 200 mL/hr over 30 Minutes Intravenous Every 8 hours 09/28/20 1412 09/28/20 1417   09/28/20 1015  linezolid (ZYVOX) IVPB 600 mg  Status:  Discontinued        600 mg 300 mL/hr over 60 Minutes Intravenous Every 12 hours 09/28/20 0917 09/28/20 1412   09/27/20 1200   piperacillin-tazobactam (ZOSYN) IVPB 3.375 g  Status:  Discontinued        3.375 g 100 mL/hr over 30 Minutes Intravenous Every 6 hours 09/27/20 0934 09/27/20 0940   09/27/20 1030  piperacillin-tazobactam (ZOSYN) IVPB 3.375 g  Status:  Discontinued        3.375 g 12.5 mL/hr over 240 Minutes Intravenous Every 8 hours 09/27/20 0940 09/28/20 1412       Assessment/Plan EtOH abuse   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 - Tbili and LFTs downtrending, continue to monitor  - Foley catheter for strict I/O monitoring - Continued IVF - GI following as well - tolerating TF at goal and having more bowel function today  - no indication for surgical intervention  - patient does not need continued abx from a surgical standpoint unless concern for cholangitis   FEN: jejunal TF @ 65 cc/h, primary replacing lytes VTE: lovenox ID: [email or web address] 11/21; Zosyn 11/20>>  LOS: 4 days    Juliet Rude , [facility] 09/30/2020, 8:36 AM Please see Amion for pager number during day hours 7:00am-4:30pm

## 2020-09-30 NOTE — Procedures (Signed)
Bronchoscopy Procedure Note  Carlos [name]  [id number]  1986/02/23  Date:09/30/20  Time:5:33 PM   Provider Performing:Jordy Verba C Katrinka Blazing   Procedure(s):  Initial Therapeutic Aspiration of Tracheobronchial Tree [phone])  Indication(s) Absent breath sounds on left  Consent Unable to obtain consent due to emergent nature of procedure.  Anesthesia In place for intubation   Time Out Verified patient identification, verified procedure, site/side was marked, verified correct patient position, special equipment/implants available, medications/allergies/relevant history reviewed, required imaging and test results available.   Sterile Technique Usual hand hygiene, masks, gowns, and gloves were used   Procedure Description Bronchoscope advanced through endotracheal tube and into airway.  Airways were examined down to subsegmental level with findings noted below.   Following diagnostic evaluation, Therapeutic aspiration performed in L mainstem, LUL, LLL  Findings:  - Mucus plug in left mainstem extending down into upper and lower lobes successfully suctioned - Appropriate positioning of the endotracheal tube   Complications/Tolerance None; patient tolerated the procedure well. Chest X-ray is needed post procedure.   EBL Minimal   Specimen(s) None

## 2020-09-30 NOTE — Progress Notes (Signed)
[facility] Gastroenterology Progress Note  Keondrick Dilks 34 y.o. November 11, 1985  CC:  Acute pancreatitis  Subjective: Patient denies any abdominal pain, nausea, or vomiting.  Patient had confusion overnight  but currently oriented x 3.  Had a bowel movement today.  ROS : Review of Systems  Cardiovascular: Negative for chest pain and palpitations.  Gastrointestinal: Positive for diarrhea. Negative for abdominal pain, blood in stool, constipation, heartburn, melena, nausea and vomiting.   Objective: Vital signs in last 24 hours: Vitals:   09/30/20 0718 09/30/20 0800  BP:  (!) 169/101  Pulse:  98  Resp:  (!) 37  Temp: 97.8 F (36.6 C)   SpO2:  94%    Physical Exam:  General:  Lethargic, ill-appearing, thin, no distress, appears stated age  Head:  Normocephalic, without obvious abnormality, atraumatic  Eyes:  Anicteric sclera, EOMs intact  Lungs:   Clear to auscultation bilaterally, mildly tachypneic  Heart:  Regular rate and rhythm, S1, S2 normal  Abdomen:   Firm, mildly distended, non-tender, bowel sounds active all four quadrants, no guarding or peritoneal signs  Extremities: Extremities normal, atraumatic, no  Edema; mittens/restraints in place  Pulses: 2+ and symmetric    Lab Results: Recent Labs    09/29/20 0110 09/29/20 1202 09/29/20 1715 09/30/20 0430  NA 136  --   --  134*  K 3.6  --   --  3.7  CL 106  --   --  103  CO2 21*  --   --  20*  GLUCOSE 131*  --   --  112*  BUN 11  --   --  9  CREATININE 1.05  --   --  0.77  CALCIUM 7.8*  --   --  7.7*  MG 2.1   < > 2.1 1.5*  PHOS 1.6*   < > 3.0 2.2*   < > = values in this interval not displayed.   Recent Labs    09/29/20 0110 09/30/20 0430  AST 38 26  ALT 29 22  ALKPHOS 46 53  BILITOT 2.1* 1.5*  PROT 5.3* 4.0*  ALBUMIN 2.0* 1.6*   Recent Labs    09/29/20 0110 09/30/20 0430  WBC 12.6* 13.6*  HGB 11.6* 10.1*  HCT 33.7* 29.7*  MCV 104.7* 108.4*  PLT 96* 113*   No results for input(s): LABPROT, INR in the last  72 hours.    Assessment: Acute necrotizing pancreatitis, presumably due to alcohol use.  No gallstones visualized on [place] or CT. -CT 11/21 showed extensive inflammatory stranding, increased inflammatory fluid, new findings of pancreatic necrosis, developing pseudocyst, and new small volume ascites -Renal function improving: BUN 11/Cr 1.05 -T. Bili 1.5, improving. Other LFTs remain WNL -Normal renal function: BUN 9/Cr 0.77  Alcohol withdrawal  Respiratory failure, atelectasis  Plan: Decrease IVF to 100 cc/h due to respiratory failure, atelectasis.    Continue supportive care.  Continue tube feeds.  [facility] will follow.  Edrick Kins PA-C 09/30/2020, 10:10 AM  Contact #  [phone]

## 2020-09-30 NOTE — Evaluation (Signed)
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 11/21 pt with worsening tachycardia and tachypnea with new AKI and transfer to ICU. PMhx: tobacco and ETOH use  Clinical Impression  Pt with left leg off bed on arrival. Pt with difficulty maintaining eyes open and short breaths with RR 34-39. Pt with SpO2 83-89% with very limited mobility on 15L HFNC and HR 113. RN reports pt has been A&Ox 4 all day despite impulsivity and pulling at lines. However, during evaluation pt with focused attention, not oriented to day only month and aware he is in the hospital. Pt with apparent agonal breathing despite pt stating no DOE. Pt with decreased cognition, cardiopulmonary function, balance, transfers and mobility who will benefit from acute therapy to maximize independence and decrease burden of care. Wife provided home setup and PLOF via phone. RN aware of all above and had provided BP medication beginning of session. Pt cued for pursed lip breathing, educated for place and situation.   Supine 181/117 Sitting 196/114 Return to supine 178/117 SpO2 83-93% on 15L with RR 34-39    Follow Up Recommendations Home health PT;Supervision/Assistance - 24 hour;CIR (pending progression)    Equipment Recommendations  None recommended by PT (TBd)    Recommendations for Other Services OT consult     Precautions / Restrictions Precautions Precautions: Fall Precaution Comments: flexiseal, cortac, bil wrist restraints and mittens      Mobility  Bed Mobility Overal bed mobility: Needs Assistance Bed Mobility: Supine to Sit;Sit to Supine     Supine to sit: Min assist Sit to supine: Mod assist   General bed mobility comments: min assist with cues and increased time to transition to EOB. Return to supine with assist to guide  legs to surface and +2 for safety. Pt able to follow commands to scoot toward HOB. After session pt impulsively sitting on side of bed with assist to return to supine despite restraints    Transfers                 General transfer comment: did not attempt due to cardiopulmonary status  Ambulation/Gait             General Gait Details: unable at this time  Stairs            Wheelchair Mobility    Modified Rankin (Stroke Patients Only)       Balance Overall balance assessment: Needs assistance Sitting-balance support: No upper extremity supported;Feet supported Sitting balance-Leahy Scale: Poor Sitting balance - Comments: pt able to sit EOB without UE support and don sock but requires guarding and cues for safety with excessive posterior lean to don sock                                     Pertinent Vitals/Pain Pain Assessment: No/denies pain    Home Living Family/patient expects to be discharged to:: Private residence Living Arrangements: Spouse/significant other Available Help at Discharge: Family;Available 24 hours/day Type of Home: Mobile home Home Access: Stairs to enter   Entrance Stairs-Number of Steps: 4 Home Layout: One level Home Equipment: None      Prior Function Level of Independence: Independent         Comments: pt normally independent, had just gotten  a job with [employer]. has 3 sons and a 3yo daughter     Hand Dominance        Extremity/Trunk Assessment   Upper Extremity Assessment Upper Extremity Assessment: Difficult to assess due to impaired cognition    Lower Extremity Assessment Lower Extremity Assessment: Difficult to assess due to impaired cognition (grossly [facility] for ROM and mobility to EOB)    Cervical / Trunk Assessment Cervical / Trunk Assessment: Normal  Communication   Communication: No difficulties  Cognition Arousal/Alertness: Awake/alert Behavior During Therapy: Restless Overall  Cognitive Status: Impaired/Different from baseline Area of Impairment: Orientation;Attention;Memory;Following commands;Safety/judgement;Awareness;Problem solving                 Orientation Level: Disoriented to;Time;Place;Situation Current Attention Level: Focused Memory: Decreased short-term memory Following Commands: Follows one step commands inconsistently;Follows one step commands with increased time Safety/Judgement: Decreased awareness of safety;Decreased awareness of deficits   Problem Solving: Slow processing;Requires verbal cues;Requires tactile cues General Comments: pt with left leg OOB on arrival. pt able to answer yes/no appropriately to hospital and aware he is in [place]. Pt with short gasping responses with decreased awareness of deficits      General Comments      Exercises     Assessment/Plan    PT Assessment Patient needs continued PT services  PT Problem List Decreased mobility;Decreased activity tolerance;Decreased balance;Decreased cognition;Cardiopulmonary status limiting activity;Decreased safety awareness       PT Treatment Interventions DME instruction;Therapeutic exercise;Gait training;Balance training;Stair training;Functional mobility training;Cognitive remediation;Therapeutic activities;Patient/family education;Neuromuscular re-education    PT Goals (Current goals can be found in the Care Plan section)  Acute Rehab PT Goals Patient Stated Goal: return home PT Goal Formulation: With patient/family Time For Goal Achievement: 10/14/20 Potential to Achieve Goals: Fair    Frequency Min 3X/week   Barriers to discharge        Co-evaluation               AM-PAC PT "6 Clicks" Mobility  Outcome Measure Help needed turning from your back to your side while in a flat bed without using bedrails?: A Little Help needed moving from lying on your back to sitting on the side of a flat bed without using bedrails?: A Little Help needed  moving to and from a bed to a chair (including a wheelchair)?: A Lot Help needed standing up from a chair using your arms (e.g., wheelchair or bedside chair)?: A Lot Help needed to walk in hospital room?: Total Help needed climbing 3-5 steps with a railing? : Total 6 Click Score: 12    End of Session   Activity Tolerance: Other (comment) (limited by cognition and cardiopulmonary function) Patient left: in bed;with call bell/phone within reach;with nursing/sitter in room;with restraints reapplied;with bed alarm set Nurse Communication: Mobility status;Precautions PT Visit Diagnosis: Other abnormalities of gait and mobility (R26.89);Difficulty in walking, not elsewhere classified (R26.2)    Time: 9417-4081 PT Time Calculation (min) (ACUTE ONLY): 23 min   Charges:   PT Evaluation $PT Eval Moderate Complexity: 1 Mod          Maylene Crocker P, PT Acute Rehabilitation Services Pager: [phone] Office: [phone]   Enedina Finner Peaches Vanoverbeke 09/30/2020, 12:31 PM

## 2020-09-30 NOTE — Progress Notes (Signed)
Peripherally Inserted Central Catheter Placement  The IV Nurse has discussed with the patient and/or persons authorized to consent for the patient, the purpose of this procedure and the potential benefits and risks involved with this procedure.  The benefits include less needle sticks, lab draws from the catheter, and the patient may be discharged home with the catheter. Risks include, but not limited to, infection, bleeding, blood clot (thrombus formation), and puncture of an artery; nerve damage and irregular heartbeat and possibility to perform a PICC exchange if needed/ordered by physician.  Alternatives to this procedure were also discussed.  Bard Power PICC patient education guide, fact sheet on infection prevention and patient information card has been provided to patient /or left at bedside.    PICC Placement Documentation  PICC Triple Lumen 09/30/20 PICC Right Brachial 38 cm 0 cm (Active)  Indication for Insertion or Continuance of Line Vasoactive infusions 09/30/20 1000  Exposed Catheter (cm) 0 cm 09/30/20 1000  Site Assessment Clean;Dry;Intact 09/30/20 1000  Lumen #1 Status Flushed;Saline locked;Blood return noted 09/30/20 1000  Lumen #2 Status Flushed;Saline locked;Blood return noted 09/30/20 1000  Lumen #3 Status Flushed;Saline locked;Blood return noted 09/30/20 1000  Dressing Type Transparent;Securing device 09/30/20 1000  Dressing Status Clean;Dry;Intact 09/30/20 1000  Antimicrobial disc in place? Yes 09/30/20 1000  Safety Lock Not Applicable 09/30/20 1000  Dressing Intervention New dressing 09/30/20 1000  Dressing Change Due 10/07/20 09/30/20 1000       Annett Fabian 09/30/2020, 10:08 AM

## 2020-09-30 NOTE — Procedures (Signed)
Intubation Procedure Note  Moise Friday  [id number]  22-Mar-1986  Date:09/30/20  Time:5:34 PM   Provider Performing:Dakari Stabler A Sahasra Belue    Procedure: Intubation (31500)  Indication(s) Respiratory Failure  Consent Unable to obtain consent due to emergent nature of procedure.   Anesthesia Etomidate, Versed, Fentanyl and Rocuronium   Time Out Verified patient identification, verified procedure, site/side was marked, verified correct patient position, special equipment/implants available, medications/allergies/relevant history reviewed, required imaging and test results available.   Sterile Technique Usual hand hygeine, masks, and gloves were used   Procedure Description Patient positioned in bed supine.  Sedation given as noted above.  Patient was intubated with endotracheal tube using Glidescope.  View was Grade 1 full glottis .  Number of attempts was 1.  Colorimetric CO2 detector was consistent with tracheal placement.   Complications/Tolerance Cuff leak Chest X-ray is ordered to verify placement.   EBL Minimal   Specimen(s) None  Chantille Navarrete A, DO 09/30/2020, 5:36 PM Pager: [phone]

## 2020-09-30 NOTE — Procedures (Addendum)
Insertion of Chest Tube Procedure Note  Carlos [name]  [id number]  [birth year]  Date:09/30/20  Time:7:00 PM    Provider Performing: Lorin Glass MD supervising Dr. Cleaster Corin  Procedure: Pleural Catheter Insertion w/ Imaging Guidance (84536)  Indication(s) Effusion  Consent Risks of the procedure as well as the alternatives and risks of each were explained to the patient and/or caregiver.  Consent for the procedure was obtained and is signed in the bedside chart  Anesthesia Topical only with 1% lidocaine    Time Out Verified patient identification, verified procedure, site/side was marked, verified correct patient position, special equipment/implants available, medications/allergies/relevant history reviewed, required imaging and test results available.   Sterile Technique Maximal sterile technique including full sterile barrier drape, hand hygiene, sterile gown, sterile gloves, mask, hair covering, sterile ultrasound probe cover (if used).   Procedure Description Ultrasound used to identify appropriate pleural anatomy for placement and overlying skin marked. Area of placement cleaned and draped in sterile fashion.  A 14 French pigtail pleural catheter was placed into the left pleural space using Seldinger technique. Appropriate return of fluid was obtained.  The tube was connected to atrium and placed on -20 cm H2O wall suction.   Complications/Tolerance None; patient tolerated the procedure well. Chest X-ray is ordered to verify placement.   EBL Minimal  Specimen(s) fluid

## 2020-09-30 NOTE — Progress Notes (Signed)
NAME:  Carlos [name], MRN:  [id number], DOB:  01-19-1986, LOS: 4 ADMISSION DATE:  09/26/2020, CONSULTATION DATE:  09/28/2020 REFERRING MD:  Dr. Allena Katz, CHIEF COMPLAINT:  Pancreatitis w/necrosis   Brief History   34 year old admitted with acute pancreatitis, presumably due to ETOH abuse.  Developed clinical worsening on 11/21 with high CIWA scores, persistent tachycardia, tachypnea, new AKI, elevating t. Bili, and repeat CT showing new changes consistent with necrotizing pancreatitis.  GI and [facility]CS following.  PCCM consulted for further ICU care.   History of present illness    34 year old male with no significant past medical history other than tobacco abuse and daily ETOH use.  Wife reports chronic history of ETOH, however very heavy over the last year; last drink on 11/18.  He presented on 11/19 with LUQ pain and non-bloody emesis.  Workup notable for lipase 2503, AST 150, ALT 108, t.bili 1. CT abd/ pelvis showing severe interstitial edematous pancreatitis with early acute pancreatic collections.  Admitted to [facility] with acute pancreatitis.  GI following.  On 11/21, he developed clinical worsening, with high CIWA scores > 20 on ativan, persistent tachycardia, tachypnea, new AKI, elevating t. Bili, and repeat CT showing new changes consistent with necrotizing pancreatitis.  PCCM consulted for ICU transfer and further management of aggressive resuscitation.    Past Medical History  Tobacco abuse, ETOH use (reported 8+ beers and liquor/ daily) Otherwise no pertinent past medical history   Significant Hospital Events   11/19 Admitted [facility] 11/21 PCCM consulted, tx ICU  Consults:  GI- [facility] PCCM  Procedures:   Significant Diagnostic Tests:  11/19 CT abd/ pelvis >> 1. Severe interstitial edematous pancreatitis with early acute pancreatic collections. 2. Diminished enhancement throughout the gland not definitive at this time for signs of necrosis, perhaps due to severe pancreatic edema. Consider  close follow-up based on clinical parameters to exclude the possibility of developing glandular necrosis. 3. Signs of ascites. 4. Small hiatal hernia and mild circumferential distal esophageal thickening.  Correlate with any clinical signs of esophagitis. 5. Probable hepatic steatosis.   11/19 limited RUQ [place] >> 1. Trace free fluid.  Hepatic steatosis. 2. Otherwise negative right upper quadrant   11/21 TTE >> LVEF 60-65%, no wall abnormalities, mild LVH, normal RV   11/21 CT abd w/contrast >> 1. Redemonstrated extensive inflammatory stranding about the pancreas and adjacent structures, with an interval increase in retroperitoneal inflammatory fluid. 2. There is new hypoenhancement of the majority of the pancreatic body and tail, involving approximately the distal 9.2 cm of the pancreas. 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 3. There is a new fluid collection of the anterior pancreatic head measuring 3.2 x 2.9 cm, consistent with developing pseudocyst. 4. New small volume ascites. 5. New moderate bilateral pleural effusions and associated atelectasis or consolidation.  Micro Data:  11/19 SARS 2/ Flu >> neg 11/20 BCx 2 >> 11/20 UC >> multiple species   Antimicrobials:  11/20 zosyn >> 11/21 linezolid >>  Interim history/subjective:  Looks a bit better today. Denies abd pain. Resp status better than yesterday evening.   Objective   Blood pressure (!) 176/98, pulse 96, temperature 98.2 F (36.8 C), temperature source Axillary, resp. rate (!) 31, height 6\' 5"  (1.956 m), weight 88.4 kg, SpO2 90 %.    FiO2 (%):  [100 %] 100 %   Intake/Output Summary (Last 24 hours) at 09/30/2020 0700 Last data filed at 09/30/2020 0600 Gross per 24 hour  Intake 5604.93 ml  Output 1565  ml  Net 4039.93 ml   Filed Weights   09/26/20 1131 09/28/20 1720 09/29/20 0500  Weight: 80.1 kg 76.2 kg 88.4 kg   Examination: Constitutional: pleasant confused man lying in bed  Eyes: pupils  equal, tracking Ears, nose, mouth, and throat: MM dry, trachea midline, cortak in place Cardiovascular: RRR, bounding peripheral pulses Respiratory: Crackles at bases, poor inspiratory effort, mildly tachypneic Gastrointestinal: firm, hypoactive BS Skin: No rashes, normal turgor Neurologic: moves all 4 ext to command Psychiatric: more oriented today Rectal tube in place  Sugars okay 1500 cc UoP K/Mg low being repleted  Resolved Hospital Problem list   Acute kidney injury improved with IVF - Daily monitoring  Assessment & Plan:   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   - PP TF per CCS and GI - Generous hydration, match ins/outs - Further imaging PRN clinical exam changes  Acute hypoxemic respiratory failure secondary to atelectasis and third spacing - IS, flutter, OOB if tolerated - Wean O2 to maintain sats > 90%  Acute metabolic encephalopathy secondary to alcohol withdrawal- seems a bit better today, complicated by lack of PO. - Phenobarb taper IV, PRN ativan - Wean precedex especially during day - Lights on at day, off at night - Thiamine, folate  Elevated WBC, Pct- presumed infection, some question of cholangitis - Zosyn x 5 days reasonable  Hypomagnesemia, hypophosphatemia, hypokalemia- repleting   Best practice:  Diet: PP TF, consider advancing Pain/Anxiety/Delirium protocol (if indicated): see above VAP protocol (if indicated): n/a DVT prophylaxis: lovenox GI prophylaxis: PPI BID Glucose control: SSI Mobility: BR Code Status: Full Family Communication: updated at bedside 09/29/20 Disposition: ICU Consulting Services: GI, CCS, PT, OT   Patient critically ill due to metabolic encephalopathy, severe necrotizing pancreatitis, acute hypoxemic respiratory failure Interventions to address this today weaning precedex, reorientation, mobilization, incentive spirometry Risk of deterioration without these interventions is  high  I personally spent 34 minutes providing critical care not including any separately billable procedures  Myrla Halsted MD [facility] 09/30/2020 7:00 AM Personal pager: [phone] If unanswered, please page CCM On-call: #[phone]

## 2020-09-30 NOTE — Progress Notes (Signed)
Pharmacy Electrolyte Replacement  Recent Labs:  Recent Labs    09/30/20 0430  K 3.7  MG 1.5*  PHOS 2.2*  CREATININE 0.77    Low Critical Values (K </= 2.5, Phos </= 1, Mg </= 1) Present: None  Plan: - Give magnesium sulfate IV 4 g x1 dose - Give potassium phosphate IV 15 mmol x1 dose - Recheck magnesium and phosphate with AM labs 11/24  Sanda Klein, PharmD, RPh  PGY-1 Pharmacy Resident 09/30/2020 6:43 AM  Please check [email or web address] for unit-specific pharmacy phone numbers.

## 2020-09-30 NOTE — Progress Notes (Signed)
RT in to assist Dr. Katrinka Blazing and Dr. Cleaster Corin with intubation for increased oxygen needs/work of breathing. Upon intubation pt with decreased breath sounds on the left despite ETT manipulation. Pt then bronched at bedside by Dr. Katrinka Blazing to find a large mucous plug in the left side. A large cuff leak was then heard with a positive cuff leak when listened at pts neck. ETT exchanged with a bougie stylet and a new 8.0 ETT was placed. Placement verified on bronchoscope by Dr. Katrinka Blazing. ETT secured at 24cm at the lip. Pt tolerated well, RT will continue to monitor.

## 2020-09-30 NOTE — Progress Notes (Signed)
Inpatient Rehab Admissions Coordinator Note:   Per PT recommendations, pt was screened for CIR candidacy by Estill Dooms, PT, DPT.  At this time we will follow from a distance.  No consult requested at this time.  Note 15L O2 and poor cardiopulmonary endurance with PT, though with possibility for mobility progression to allow for [facility].  Please contact me with questions.   Estill Dooms, PT, DPT [phone] 09/30/20 2:06 PM

## 2020-09-30 NOTE — Progress Notes (Signed)
Unfortunately progressive respiratory distress and obtundation through day.  Discussed with wife that we should probably place him on ventilator now rather than in an emergent situation.  She agrees to proceed.  ETT placed, noted to have absent breath sounds on left so bronch performed with left mainstem mucous plugging successfully suctioned.  ETT also blew its cuff so new ETT placed over bougee.  No significant desaturations during all of this.  Myrla Halsted MD PCCM

## 2020-09-30 NOTE — Procedures (Signed)
Intubation Procedure Note  Courage Biglow  [id number]  [birth year]  Date:09/30/20  Time:5:37 PM   Provider Performing:Roxanne Orner A Nhyira Leano    Procedure: Intubation (31500)  Indication(s) Respiratory Failure  Consent Unable to obtain consent due to emergent nature of procedure.   Anesthesia Etomidate, Versed, Fentanyl and Rocuronium   Time Out Verified patient identification, verified procedure, site/side was marked, verified correct patient position, special equipment/implants available, medications/allergies/relevant history reviewed, required imaging and test results available.   Sterile Technique Usual hand hygeine, masks, and gloves were used   Procedure Description Patient positioned in bed supine.  Sedation given as noted above.  Patient was intubated with endotracheal tube using Bougie.  View was Grade 1 full glottis .  Number of attempts was 1.  Colorimetric CO2 detector was consistent with tracheal placement.   Complications/Tolerance None; patient tolerated the procedure well. Chest X-ray is ordered to verify placement.   EBL Minimal   Specimen(s) None  Ebrahim Deremer A, DO 09/30/2020, 5:38 PM Pager: [phone]

## 2020-10-01 ENCOUNTER — Inpatient Hospital Stay (HOSPITAL_COMMUNITY): Payer: Medicaid Other

## 2020-10-01 LAB — COMPREHENSIVE METABOLIC PANEL
ALT: 12 U/L (ref 0–44)
AST: 14 U/L — ABNORMAL LOW (ref 15–41)
Albumin: 1.6 g/dL — ABNORMAL LOW (ref 3.5–5.0)
Alkaline Phosphatase: 31 U/L — ABNORMAL LOW (ref 38–126)
Anion gap: 9 (ref 5–15)
BUN: <5 mg/dL — ABNORMAL LOW (ref 6–20)
CO2: 17 mmol/L — ABNORMAL LOW (ref 22–32)
Calcium: 5.4 mg/dL — CL (ref 8.9–10.3)
Chloride: 110 mmol/L (ref 98–111)
Creatinine, Ser: 0.54 mg/dL — ABNORMAL LOW (ref 0.61–1.24)
GFR, Estimated: >60 mL/min (ref >60)
Glucose, Bld: 159 mg/dL — ABNORMAL HIGH (ref 70–99)
Potassium: 2 mmol/L — CL (ref 3.5–5.1)
Sodium: 136 mmol/L (ref 135–145)
Total Bilirubin: 1 mg/dL (ref 0.3–1.2)
Total Protein: <3 g/dL — ABNORMAL LOW (ref 6.5–8.1)

## 2020-10-01 LAB — BASIC METABOLIC PANEL
Anion gap: 12 (ref 5–15)
Anion gap: 11 (ref 5–15)
Anion gap: 11 (ref 5–15)
BUN: 7 mg/dL (ref 6–20)
BUN: 7 mg/dL (ref 6–20)
BUN: 6 mg/dL (ref 6–20)
CO2: 25 mmol/L (ref 22–32)
CO2: 27 mmol/L (ref 22–32)
CO2: 23 mmol/L (ref 22–32)
Calcium: 8.2 mg/dL — ABNORMAL LOW (ref 8.9–10.3)
Calcium: 8 mg/dL — ABNORMAL LOW (ref 8.9–10.3)
Calcium: 7.1 mg/dL — ABNORMAL LOW (ref 8.9–10.3)
Chloride: 99 mmol/L (ref 98–111)
Chloride: 102 mmol/L (ref 98–111)
Chloride: 98 mmol/L (ref 98–111)
Creatinine, Ser: 0.83 mg/dL (ref 0.61–1.24)
Creatinine, Ser: 1.05 mg/dL (ref 0.61–1.24)
Creatinine, Ser: 0.84 mg/dL (ref 0.61–1.24)
GFR, Estimated: >60 mL/min (ref >60)
GFR, Estimated: >60 mL/min (ref >60)
GFR, Estimated: >60 mL/min (ref >60)
Glucose, Bld: 258 mg/dL — ABNORMAL HIGH (ref 70–99)
Glucose, Bld: 198 mg/dL — ABNORMAL HIGH (ref 70–99)
Glucose, Bld: 209 mg/dL — ABNORMAL HIGH (ref 70–99)
Potassium: 2.7 mmol/L — CL (ref 3.5–5.1)
Potassium: 3.3 mmol/L — ABNORMAL LOW (ref 3.5–5.1)
Potassium: 3 mmol/L — ABNORMAL LOW (ref 3.5–5.1)
Sodium: 136 mmol/L (ref 135–145)
Sodium: 140 mmol/L (ref 135–145)
Sodium: 132 mmol/L — ABNORMAL LOW (ref 135–145)

## 2020-10-01 LAB — GLUCOSE, CAPILLARY
Glucose-Capillary: 201 mg/dL — ABNORMAL HIGH (ref 70–99)
Glucose-Capillary: 261 mg/dL — ABNORMAL HIGH (ref 70–99)
Glucose-Capillary: 162 mg/dL — ABNORMAL HIGH (ref 70–99)
Glucose-Capillary: 177 mg/dL — ABNORMAL HIGH (ref 70–99)
Glucose-Capillary: 206 mg/dL — ABNORMAL HIGH (ref 70–99)
Glucose-Capillary: 178 mg/dL — ABNORMAL HIGH (ref 70–99)

## 2020-10-01 LAB — CBC
HCT: 23.2 % — ABNORMAL LOW (ref 39.0–52.0)
HCT: 26.3 % — ABNORMAL LOW (ref 39.0–52.0)
Hemoglobin: 8.1 g/dL — ABNORMAL LOW (ref 13.0–17.0)
Hemoglobin: 8.9 g/dL — ABNORMAL LOW (ref 13.0–17.0)
MCH: 38.2 pg — ABNORMAL HIGH (ref 26.0–34.0)
MCH: 36 pg — ABNORMAL HIGH (ref 26.0–34.0)
MCHC: 34.9 g/dL (ref 30.0–36.0)
MCHC: 33.8 g/dL (ref 30.0–36.0)
MCV: 109.4 fL — ABNORMAL HIGH (ref 80.0–100.0)
MCV: 106.5 fL — ABNORMAL HIGH (ref 80.0–100.0)
Platelets: 125 10*3/uL — ABNORMAL LOW (ref 150–400)
Platelets: 146 10*3/uL — ABNORMAL LOW (ref 150–400)
RBC: 2.12 MIL/uL — ABNORMAL LOW (ref 4.22–5.81)
RBC: 2.47 MIL/uL — ABNORMAL LOW (ref 4.22–5.81)
RDW: 12.9 % (ref 11.5–15.5)
RDW: 12.7 % (ref 11.5–15.5)
WBC: 7.7 10*3/uL (ref 4.0–10.5)
WBC: 8.4 10*3/uL (ref 4.0–10.5)
nRBC: 0 % (ref 0.0–0.2)
nRBC: 0 % (ref 0.0–0.2)

## 2020-10-01 LAB — MAGNESIUM
Magnesium: 1.4 mg/dL — ABNORMAL LOW (ref 1.7–2.4)
Magnesium: 2.3 mg/dL (ref 1.7–2.4)
Magnesium: 2 mg/dL (ref 1.7–2.4)

## 2020-10-01 LAB — PHOSPHORUS
Phosphorus: <1 mg/dL — CL (ref 2.5–4.6)
Phosphorus: <1 mg/dL — CL (ref 2.5–4.6)
Phosphorus: 1.8 mg/dL — ABNORMAL LOW (ref 2.5–4.6)

## 2020-10-01 LAB — PATHOLOGIST SMEAR REVIEW

## 2020-10-01 MED ORDER — INSULIN ASPART 100 UNIT/ML ~~LOC~~ SOLN
0.0000 [IU] | SUBCUTANEOUS | Status: DC
  Administered 2020-10-01: 3 [IU] via SUBCUTANEOUS
  Administered 2020-10-01: 5 [IU] via SUBCUTANEOUS
  Administered 2020-10-01: 8 [IU] via SUBCUTANEOUS
  Administered 2020-10-01 (×2): 3 [IU] via SUBCUTANEOUS
  Administered 2020-10-02: 2 [IU] via SUBCUTANEOUS
  Administered 2020-10-02: 5 [IU] via SUBCUTANEOUS
  Administered 2020-10-02: 3 [IU] via SUBCUTANEOUS
  Administered 2020-10-02: 5 [IU] via SUBCUTANEOUS
  Administered 2020-10-02 (×2): 3 [IU] via SUBCUTANEOUS
  Administered 2020-10-03: 8 [IU] via SUBCUTANEOUS
  Administered 2020-10-03: 3 [IU] via SUBCUTANEOUS
  Administered 2020-10-03 (×2): 8 [IU] via SUBCUTANEOUS
  Administered 2020-10-03 – 2020-10-04 (×2): 3 [IU] via SUBCUTANEOUS
  Administered 2020-10-04: 8 [IU] via SUBCUTANEOUS
  Administered 2020-10-04: 5 [IU] via SUBCUTANEOUS
  Administered 2020-10-04 (×2): 3 [IU] via SUBCUTANEOUS
  Administered 2020-10-04: 8 [IU] via SUBCUTANEOUS
  Administered 2020-10-05: 2 [IU] via SUBCUTANEOUS
  Administered 2020-10-05 (×2): 8 [IU] via SUBCUTANEOUS
  Administered 2020-10-05: 5 [IU] via SUBCUTANEOUS
  Administered 2020-10-05: 3 [IU] via SUBCUTANEOUS
  Administered 2020-10-05: 5 [IU] via SUBCUTANEOUS
  Administered 2020-10-06: 2 [IU] via SUBCUTANEOUS
  Administered 2020-10-06 (×2): 3 [IU] via SUBCUTANEOUS
  Administered 2020-10-06: 5 [IU] via SUBCUTANEOUS
  Administered 2020-10-07: 2 [IU] via SUBCUTANEOUS
  Administered 2020-10-07 (×2): 3 [IU] via SUBCUTANEOUS
  Administered 2020-10-07: 2 [IU] via SUBCUTANEOUS

## 2020-10-01 MED ORDER — POTASSIUM CHLORIDE 10 MEQ/50ML IV SOLN
10.0000 meq | INTRAVENOUS | Status: AC
  Administered 2020-10-01 – 2020-10-02 (×6): 10 meq via INTRAVENOUS
  Filled 2020-10-01 (×6): qty 50

## 2020-10-01 MED ORDER — CLONAZEPAM 1 MG PO TABS: 1.0000 mg | ORAL_TABLET | Freq: Two times a day (BID) | ORAL | Status: DC

## 2020-10-01 MED ORDER — POTASSIUM CHLORIDE 20 MEQ PO PACK
40.0000 meq | PACK | Freq: Once | ORAL | Status: AC
  Administered 2020-10-01: 40 meq
  Filled 2020-10-01: qty 2

## 2020-10-01 MED ORDER — POTASSIUM PHOSPHATES 15 MMOLE/5ML IV SOLN
45.0000 mmol | Freq: Once | INTRAVENOUS | Status: AC
  Administered 2020-10-01: 45 mmol via INTRAVENOUS
  Filled 2020-10-01: qty 15

## 2020-10-01 MED ORDER — INSULIN DETEMIR 100 UNIT/ML ~~LOC~~ SOLN: 8.0000 [IU] | Freq: Two times a day (BID) | SUBCUTANEOUS | Status: DC

## 2020-10-01 MED ORDER — VITAL 1.5 CAL PO LIQD
1000.0000 mL | ORAL | Status: DC
  Administered 2020-10-01 – 2020-10-02 (×2): 1000 mL
  Filled 2020-10-01: qty 1000

## 2020-10-01 MED ORDER — SODIUM CHLORIDE 0.9 % IV SOLN
1.0000 mg/kg/h | INTRAVENOUS | Status: DC
  Administered 2020-10-01 – 2020-10-02 (×4): 1 mg/kg/h via INTRAVENOUS
  Filled 2020-10-01 (×7): qty 5

## 2020-10-01 MED ORDER — MAGNESIUM SULFATE IN D5W 1-5 GM/100ML-% IV SOLN
1.0000 g | Freq: Once | INTRAVENOUS | Status: AC
  Administered 2020-10-01: 1 g via INTRAVENOUS
  Filled 2020-10-01: qty 100

## 2020-10-01 MED ORDER — QUETIAPINE FUMARATE 50 MG PO TABS: 50.0000 mg | ORAL_TABLET | Freq: Two times a day (BID) | ORAL | Status: DC

## 2020-10-01 MED ORDER — POLYETHYLENE GLYCOL 3350 17 G PO PACK
17.0000 g | PACK | Freq: Every day | ORAL | Status: DC
  Administered 2020-10-01 – 2020-10-02 (×2): 17 g
  Filled 2020-10-01 (×2): qty 1

## 2020-10-01 MED ORDER — POTASSIUM PHOSPHATES 15 MMOLE/5ML IV SOLN
30.0000 mmol | Freq: Once | INTRAVENOUS | Status: AC
  Administered 2020-10-02: 30 mmol via INTRAVENOUS
  Filled 2020-10-01: qty 10

## 2020-10-01 MED ORDER — QUETIAPINE FUMARATE 50 MG PO TABS
50.0000 mg | ORAL_TABLET | Freq: Two times a day (BID) | ORAL | Status: DC
  Administered 2020-10-01 – 2020-10-02 (×4): 50 mg
  Filled 2020-10-01 (×4): qty 1

## 2020-10-01 MED ORDER — CLONAZEPAM 1 MG PO TABS
1.0000 mg | ORAL_TABLET | Freq: Two times a day (BID) | ORAL | Status: DC
  Administered 2020-10-01 – 2020-10-02 (×4): 1 mg
  Filled 2020-10-01 (×4): qty 1

## 2020-10-01 NOTE — Progress Notes (Signed)
Progress Note     Subjective: Intubated, sedated.  Objective: Vital signs in last 24 hours: Temp:  [98.3 F (36.8 C)-100.1 F (37.8 C)] 99.1 F (37.3 C) (11/24 0819) Pulse Rate:  [76-130] 83 (11/24 1000) Resp:  [17-40] 20 (11/24 1000) BP: (102-188)/(67-117) 103/67 (11/24 1000) SpO2:  [90 %-100 %] 100 % (11/24 1000) FiO2 (%):  [40 %-100 %] 40 % (11/24 0732) Weight:  [91.8 kg] 91.8 kg (11/24 0400) Last BM Date: 09/30/20  Intake/Output from previous day: 11/23 0701 - 11/24 0700 In: 5008.8 [I.V.:3561.8; NG/GT:555; IV Piggyback:892] Out: 3900 [Urine:2280; Stool:600; Chest Tube:1020] Intake/Output this shift: Total I/O In: 294.4 [I.V.:257.6; IV Piggyback:36.8] Out: 200 [Urine:200]  PE: General: thin male who is intubated, sedated HEENT: sclera anicteric Heart: sinus tachycardia Lungs: CTAB Abd: soft, somewhat distended, BS hypoactive MS: all 4 extremities are symmetrical with no cyanosis, clubbing, or edema. Psych: A&Ox3    Lab Results:  Recent Labs    10/01/20 0423 10/01/20 0754  WBC 7.7 8.4  HGB 8.1* 8.9*  HCT 23.2* 26.3*  PLT 125* 146*   BMET Recent Labs    10/01/20 0423 10/01/20 0658  NA 136 136  K 2.0* 2.7*  CL 110 99  CO2 17* 25  GLUCOSE 159* 258*  BUN <5* 7  CREATININE 0.54* 0.83  CALCIUM 5.4* 8.2*   PT/INR No results for input(s): LABPROT, INR in the last 72 hours. CMP     Component Value Date/Time   NA 136 10/01/2020 0658   K 2.7 (LL) 10/01/2020 0658   CL 99 10/01/2020 0658   CO2 25 10/01/2020 0658   GLUCOSE 258 (H) 10/01/2020 0658   BUN 7 10/01/2020 0658   CREATININE 0.83 10/01/2020 0658   CREATININE 1.13 07/03/2019 1435   CALCIUM 8.2 (L) 10/01/2020 0658   PROT <3.0 (L) 10/01/2020 0423   ALBUMIN 1.6 (L) 10/01/2020 0423   AST 14 (L) 10/01/2020 0423   ALT 12 10/01/2020 0423   ALKPHOS 31 (L) 10/01/2020 0423   BILITOT 1.0 10/01/2020 0423   GFRNONAA >60 10/01/2020 0658   GFRNONAA 85 07/03/2019 1435   GFRAA 98 07/03/2019 1435    Lipase     Component Value Date/Time   LIPASE 549 (H) 09/27/2020 0818       Studies/Results: DG Chest 1 View  Result Date: 09/30/2020 CLINICAL DATA:  Chest tube in place EXAM: CHEST  1 VIEW.  Patient is markedly rotated. COMPARISON:  Chest x-ray 09/30/2020 6:12 p.m. FINDINGS: Endotracheal tube with tip terminating approximately 5 cm above the carina. Right PICC with tip overlying the right atrium. Enteric tube coursing below diaphragm with tip collimated off view. Interval placement of a left chest tube pigtail overlying the left hemithorax. The heart size and mediastinal contours are unchanged. Low lung volumes. Suggestion of a right lower lobe collapse. Interstitial markings of the left lung likely due to re-expansion edema. No pulmonary edema. Interval decrease of a now trace left pleural effusion. Persistent at least small right pleural effusion. No significant pneumothorax. No acute osseous abnormality. IMPRESSION: 1. Left chest tube overlying the left hemithorax with interval marked improved trace left pleural effusion. No associated significant pneumothorax. Interstitial markings of the left lung likely due to re-expansion edema. Attention on follow-up. 2. Suggestion of interval development of a right lower lobe collapse. Recommend re-evaluation on chest x-ray with improved inspiratory effort and no patient rotation. 3. Persistent at least small small right pleural effusion. 4. Right PICC overlies the right atrium. Attention on follow-up on chest  x-ray with improved inspiratory effort and no patient rotation. Electronically Signed   By: Tish Frederickson M.D.   On: 09/30/2020 19:49   DG Chest 1 View  Result Date: 09/29/2020 CLINICAL DATA:  Hypoxia EXAM: CHEST  1 VIEW COMPARISON:  09/28/2020 FINDINGS: A feeding tube is been placed and has enter the stomach. Low lung volumes are present, causing crowding of the pulmonary vasculature. Mild enlargement of the cardiopericardial silhouette noted  with airspace opacities along both lung bases. These could be from atelectasis or pneumonia. Blunted left costophrenic angle, pleural effusion not excluded. There is mild rightward deviation of the trachea, some degree of atelectasis medially in the right upper lobe is a distinct possibility. Follow up imaging to ensure clearance and exclude the possibility of a central obstructing lesion is recommended. IMPRESSION: 1. Bibasilar airspace opacities may be from atelectasis or pneumonia. 2. Mild enlargement of the cardiopericardial silhouette. 3. Potential small left pleural effusion. 4. Medial right upper lobe atelectasis, follow up imaging with attention to this region is recommended to exclude a central obstructing lesion. Electronically Signed   By: Gaylyn Rong M.D.   On: 09/29/2020 14:21   US RENAL  Result Date: 09/29/2020 CLINICAL DATA:  Acute kidney injury EXAM: RENAL / URINARY TRACT ULTRASOUND COMPLETE COMPARISON:  CT 09/28/2020 FINDINGS: Right Kidney: Renal measurements: 10 x 5.6 x 5.7 cm = volume: 167.7 mL. Echogenicity within normal limits. No mass or hydronephrosis visualized. Left Kidney: Renal measurements: 10.5 x 7.1 x 5.8 cm = volume: 226.4 mL. Echogenicity within normal limits. No mass or hydronephrosis visualized. Bladder: Foley catheter in the decompressed urinary bladder. Other: Small amount of abdominal ascites. IMPRESSION: 1. Ultrasound appearance of the kidneys is within normal limits. 2. Small amount of ascites Electronically Signed   By: Jasmine Pang M.D.   On: 09/29/2020 21:39   DG Chest Port 1 View  Result Date: 10/01/2020 CLINICAL DATA:  Hypoxia EXAM: PORTABLE CHEST 1 VIEW COMPARISON:  September 30, 2020 FINDINGS: Endotracheal tube tip is 4.9 cm above the carina. Chest tube present on the left. Feeding tube tip is below the diaphragm. Central catheter tip is in the superior vena cava near the cavoatrial junction. No pneumothorax appreciable. There is a pleural effusion on  each side with consolidation in the left lower lobe region. There is atelectatic change in the right mid and lower lung regions. Heart is mildly prominent with pulmonary vascularity normal. No adenopathy. No bone lesions. IMPRESSION: Tube and catheter positions as described without evident pneumothorax. Pleural effusions bilaterally with consolidation concerning for a degree of pneumonia left lower lobe. Areas of relatively mild atelectatic change on the right. Stable cardiac prominence. Electronically Signed   By: Bretta Bang III M.D.   On: 10/01/2020 08:18   DG Chest Port 1 View  Result Date: 09/30/2020 CLINICAL DATA:  34 year old male with intubation. EXAM: PORTABLE CHEST 1 VIEW COMPARISON:  Chest radiograph dated 09/29/2020. FINDINGS: Endotracheal tube with tip approximately 3 cm above the carina. Feeding tube extends below the diaphragm with tip beyond the inferior margin of the image but likely in the duodenum. Right-sided PICC with tip close to the cavoatrial junction. Bilateral pleural effusions, increased since the prior radiograph. There is near complete opacification of the left hemithorax with only a small aerated portion of the left lung. No pneumothorax. Stable cardiac silhouette. No acute osseous pathology. IMPRESSION: 1. Endotracheal tube above the carina. 2. Bilateral pleural effusions, increased since the prior radiograph. Near complete consolidation of the left lung. Electronically Signed  By: Elgie Collard M.D.   On: 09/30/2020 18:43   DG Abd Portable 1V  Result Date: 09/29/2020 CLINICAL DATA:  34 year old male with feeding tube placement. EXAM: PORTABLE ABDOMEN - 1 VIEW COMPARISON:  Abdominal radiograph dated 09/29/2020. FINDINGS: Interval advancement of the feeding tube with tip in the distal duodenum close to the expected location of the ligament of Treitz. Mildly dilated loops of small bowel measure up to 4.5 cm in the right lower quadrant. IMPRESSION: Interval advancement  of the feeding tube with tip in the distal duodenum. Electronically Signed   By: Elgie Collard M.D.   On: 09/29/2020 15:38   [place] EKG SITE RITE  Result Date: 09/30/2020 If Site Rite image not attached, placement could not be confirmed due to current cardiac rhythm.   Anti-infectives: Anti-infectives (From admission, onward)   Start     Dose/Rate Route Frequency Ordered Stop   09/28/20 2000  piperacillin-tazobactam (ZOSYN) IVPB 3.375 g        3.375 g 12.5 mL/hr over 240 Minutes Intravenous Every 8 hours 09/28/20 1417 10/02/20 2359   09/28/20 1500  meropenem (MERREM) 1 g in sodium chloride 0.9 % 100 mL IVPB  Status:  Discontinued        1 g 200 mL/hr over 30 Minutes Intravenous Every 8 hours 09/28/20 1412 09/28/20 1417   09/28/20 1015  linezolid (ZYVOX) IVPB 600 mg  Status:  Discontinued        600 mg 300 mL/hr over 60 Minutes Intravenous Every 12 hours 09/28/20 0917 09/28/20 1412   09/27/20 1200  piperacillin-tazobactam (ZOSYN) IVPB 3.375 g  Status:  Discontinued        3.375 g 100 mL/hr over 30 Minutes Intravenous Every 6 hours 09/27/20 0934 09/27/20 0940   09/27/20 1030  piperacillin-tazobactam (ZOSYN) IVPB 3.375 g  Status:  Discontinued        3.375 g 12.5 mL/hr over 240 Minutes Intravenous Every 8 hours 09/27/20 0940 09/28/20 1412       Assessment/Plan EtOH abuse   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 - Tbili and LFTs normalized - Foley catheter for strict I/O monitoring - Continued IVF - GI following as well - tolerating TF previously; rate dropped to 20 however for possible refeeding syndrome - no indication for surgical intervention  - patient does not need continued abx from a surgical standpoint unless concern for cholangitis   FEN: lytes as per ccm VTE: lovenox ID: [email or web address] 11/21; Zosyn 11/20>>  -Surgery will sign off for now; please let us know if questions or concerns arise   LOS: 5 days   Marin Olp, MD [facility] Use [email or web address] to contact on call provider

## 2020-10-01 NOTE — Procedures (Signed)
Insertion of Chest Tube Procedure Note  Carlos [name]  [id number]  08-01-1986  Date:10/01/20  Time:3:49 PM    Provider Performing: Versie Starks   Procedure: Pleural Catheter Insertion w/ Imaging Guidance (88891)  Indication(s) Effusion  Consent Risks of the procedure as well as the alternatives and risks of each were explained to the patient and/or caregiver.  Consent for the procedure was obtained verbally from patient's spouse.   Anesthesia Topical only with 1% lidocaine    Time Out Verified patient identification, verified procedure, site/side was marked, verified correct patient position, special equipment/implants available, medications/allergies/relevant history reviewed, required imaging and test results available.   Sterile Technique Maximal sterile technique including full sterile barrier drape, hand hygiene, sterile gown, sterile gloves, mask, hair covering, sterile ultrasound probe cover (if used).   Procedure Description Ultrasound used to identify appropriate pleural anatomy for placement and overlying skin marked. Area of placement cleaned and draped in sterile fashion.  A 14 French pigtail pleural catheter was placed into the right pleural space using Seldinger technique. Appropriate return of fluid was obtained.  The tube was connected to atrium and placed on -20 cm H2O wall suction.   Complications/Tolerance None; patient tolerated the procedure well. Chest X-ray is ordered to verify placement.   EBL Minimal  Specimen(s) none   Carlos [name] A, DO 10/01/2020, 3:51 PM Pager: [phone]

## 2020-10-01 NOTE — Progress Notes (Signed)
Critical lab values called. Potassium 2.7 and Phosphorus < 1. MD made aware. Replacement orders given.

## 2020-10-01 NOTE — Progress Notes (Signed)
NAME:  Rockland Kotarski, MRN:  [id number], DOB:  [birth year], LOS: 5 ADMISSION DATE:  09/26/2020, CONSULTATION DATE:  09/28/2020 REFERRING MD:  Dr. Allena Katz, CHIEF COMPLAINT:  Pancreatitis w/necrosis   Brief History   34 year old admitted with acute pancreatitis, presumably due to ETOH abuse.  Developed clinical worsening on 11/21 with high CIWA scores, persistent tachycardia, tachypnea, new AKI, elevating t. Bili, and repeat CT showing new changes consistent with necrotizing pancreatitis.  GI and [facility]CS following.  PCCM consulted for further ICU care.   History of present illness    34 year old male with no significant past medical history other than tobacco abuse and daily ETOH use.  Wife reports chronic history of ETOH, however very heavy over the last year; last drink on 11/18.  He presented on 11/19 with LUQ pain and non-bloody emesis.  Workup notable for lipase 2503, AST 150, ALT 108, t.bili 1. CT abd/ pelvis showing severe interstitial edematous pancreatitis with early acute pancreatic collections.  Admitted to [facility] with acute pancreatitis.  GI following.  On 11/21, he developed clinical worsening, with high CIWA scores > 20 on ativan, persistent tachycardia, tachypnea, new AKI, elevating t. Bili, and repeat CT showing new changes consistent with necrotizing pancreatitis.  PCCM consulted for ICU transfer and further management of aggressive resuscitation.    Past Medical History  Tobacco abuse, ETOH use (reported 8+ beers and liquor/ daily) Otherwise no pertinent past medical history   Significant Hospital Events   11/19 Admitted [facility] 11/21 PCCM consulted, tx ICU 11/23 intubation, bronchoscopy, left pigtail placement  Consults:  GI- [facility] PCCM  Procedures:   Significant Diagnostic Tests:  11/19 CT abd/ pelvis >> 1. Severe interstitial edematous pancreatitis with early acute pancreatic collections. 2. Diminished enhancement throughout the gland not definitive at this time for signs of  necrosis, perhaps due to severe pancreatic edema. Consider close follow-up based on clinical parameters to exclude the possibility of developing glandular necrosis. 3. Signs of ascites. 4. Small hiatal hernia and mild circumferential distal esophageal thickening.  Correlate with any clinical signs of esophagitis. 5. Probable hepatic steatosis.   11/19 limited RUQ [place] >> 1. Trace free fluid.  Hepatic steatosis. 2. Otherwise negative right upper quadrant   11/21 TTE >> LVEF 60-65%, no wall abnormalities, mild LVH, normal RV   11/21 CT abd w/contrast >> 1. Redemonstrated extensive inflammatory stranding about the pancreas and adjacent structures, with an interval increase in retroperitoneal inflammatory fluid. 2. There is new hypoenhancement of the majority of the pancreatic body and tail, involving approximately the distal 9.2 cm of the pancreas. 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 3. There is a new fluid collection of the anterior pancreatic head measuring 3.2 x 2.9 cm, consistent with developing pseudocyst. 4. New small volume ascites. 5. New moderate bilateral pleural effusions and associated atelectasis or consolidation.  Micro Data:  11/19 SARS 2/ Flu >> neg 11/20 BCx 2 >> 11/20 UC >> multiple species   Antimicrobials:  11/20 zosyn >> 11/25 planned  Interim history/subjective:  Decompensated last night and required intubation.  Had L effusion and L mucus plugging requiring bronch and pigtail.  Now difficult to sedate on vent.   Objective   Blood pressure 121/73, pulse 76, temperature 98.5 F (36.9 C), temperature source Axillary, resp. rate 20, height 6\' 5"  (1.956 m), weight 91.8 kg, SpO2 100 %.    Vent Mode: PCV FiO2 (%):  [50 %-100 %] 50 % Set Rate:  [16 bmp-24 bmp] 20 bmp Vt  Set:  [710 mL] 710 mL PEEP:  [8 cmH20-10 cmH20] 8 cmH20 Plateau Pressure:  [24 cmH20-25 cmH20] 24 cmH20   Intake/Output Summary (Last 24 hours) at 10/01/2020 8916 Last data filed at  10/01/2020 0600 Gross per 24 hour  Intake 4852.09 ml  Output 3900 ml  Net 952.09 ml   Filed Weights   09/28/20 1720 09/29/20 0500 10/01/20 0400  Weight: 76.2 kg 88.4 kg 91.8 kg   Examination: Constitutional: agitated man on ventilator  Eyes: pupils equal tracking Ears, nose, mouth, and throat: ETT in place, thick secretions Cardiovascular: RRR, ext warm Respiratory: Diminished bases, L pigtail with serous output, [place] R chest with volume loss, small-moderate effusion Gastrointestinal: Distended, fair amount of ascites along inferior abdomen Skin: No rashes, normal turgor Neurologic: writhing around, not following commands Psychiatric: cannot assess  Labs are a bit off this am, redrawing CXR pending  Resolved Hospital Problem list   Acute kidney injury improved with IVF - Daily monitoring  Assessment & Plan:   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   - PP TF per CCS and GI - Generous hydration, match ins/outs, going to hold IVF a bit to try to reduce third spacing - Further imaging PRN clinical exam changes  - Monitor abd exam, consider diagnostic/therapeutic paracentesis if becomes more distended to increase FRC  Acute hypoxemic respiratory failure secondary to atelectasis and third spacing, exudative effusion.  Intubated 11/23 evening for progressive distress. - Vent support, VAP prevention bundle - L pigtail to -20cmH2O - Consider R pigtail vs. Repeat bronchoscopy depending on CXR this AM  Acute metabolic encephalopathy secondary to alcohol withdrawal- on phenobarb taper. - Continue phenobarb taper as ordered - Propofol/fentanyl titrated to RASS 0 - Add ketamine/seroquel trial - PRN ativan - Thiamine/folate  Elevated WBC, Pct- presumed infection, some question of cholangitis but bili argues against, exudative effusion and thick mucus endobronchially, question aspiration - Zosyn x 5 days reasonable, end date tomorrow  Illness-  and pancreatitis-associated Hyperglycemia -SSI, add levemir  Hypomagnesemia, hypophosphatemia, hypokalemia- repeat AM labs pending   Best practice:  Diet: PP TF per CCS Pain/Anxiety/Delirium protocol (if indicated): see above VAP protocol (if indicated): in place DVT prophylaxis: lovenox GI prophylaxis: PPI BID Glucose control: see above Mobility: BR Code Status: Full Family Communication: updated wife at bedside 09/30/20 Disposition: ICU Consulting Services: GI, CCS, PT, OT   Patient critically ill due to metabolic encephalopathy, respiratory failure Interventions to address this today sedation titration, ventilator titration Risk of deterioration without these interventions is high  I personally spent 42 minutes providing critical care not including any separately billable procedures  Myrla Halsted MD [facility] 10/01/2020 7:11 AM Personal pager: [phone] If unanswered, please page CCM On-call: #[phone]

## 2020-10-01 NOTE — Progress Notes (Addendum)
[facility] Gastroenterology Progress Note  Carlos [name] 34 y.o. November 07, 1986  CC:  Acute pancreatitis  Subjective: Patient intubated and sedated.  ROS : Review of Systems  Unable to perform ROS: Intubated   Objective: Vital signs in last 24 hours: Vitals:   10/01/20 0819 10/01/20 0900  BP:  121/73  Pulse:  78  Resp:  (!) 40  Temp: 99.1 F (37.3 C)   SpO2:  100%    Physical Exam:  General:  Intubated and sedated, ill-appearing, thin, no distress, appears stated age  Head:  Normocephalic, without obvious abnormality, atraumatic  Eyes:  Anicteric sclera, EOMs intact  Lungs:   Clear to auscultation bilaterally, mildly tachypneic  Heart:  Regular rate and rhythm, S1, S2 normal  Abdomen:   Mildly firm and distended, non-tender (no grimace), hypoactive bowel sounds, no peritoneal signs  Extremities: Extremities normal, atraumatic, no  Edema; mittens/restraints in place  Pulses: 2+ and symmetric    Lab Results: Recent Labs    10/01/20 0423 10/01/20 0658  NA 136 136  K 2.0* 2.7*  CL 110 99  CO2 17* 25  GLUCOSE 159* 258*  BUN <5* 7  CREATININE 0.54* 0.83  CALCIUM 5.4* 8.2*  MG 1.4* 2.3  PHOS <1.0* <1.0*   Recent Labs    09/30/20 0430 10/01/20 0423  AST 26 14*  ALT 22 12  ALKPHOS 53 31*  BILITOT 1.5* 1.0  PROT 4.0* <3.0*  ALBUMIN 1.6* 1.6*   Recent Labs    10/01/20 0423 10/01/20 0754  WBC 7.7 8.4  HGB 8.1* 8.9*  HCT 23.2* 26.3*  MCV 109.4* 106.5*  PLT 125* 146*   No results for input(s): LABPROT, INR in the last 72 hours.    Assessment: Acute necrotizing pancreatitis, presumably due to alcohol use.  No gallstones visualized on [place] or CT. -CT 11/21 showed extensive inflammatory stranding, increased inflammatory fluid, new findings of pancreatic necrosis, developing pseudocyst, and new small volume ascites -Renal function stable: BUN <5/Cr 0.54 -T. Bili 1.0, improving. Other LFTs remain WNL  Alcohol withdrawal  Respiratory failure, atelectasis, now  intubated  Plan: Continue supportive care.  Continue tube feeds.  Miralax PRN per tube.  [facility] will follow.  Edrick Kins PA-C 10/01/2020, 9:50 AM  Contact #  [phone]

## 2020-10-01 NOTE — Progress Notes (Signed)
Nutrition Follow-up  DOCUMENTATION CODES:   Not applicable  INTERVENTION:   Continue tube feeding via Cortrak tube: Vital 1.5 at 20 ml/h (480 ml per day) Prosource TF 45 ml BID  Provides 800 kcal, 54 gm protein, 367 ml free water daily  Continue to monitor K, Phos, and Mag levels BID and replete as needed. When K, Phos, and Mag are WNL, increase TF by 10 ml every 8 hours to goal rate of 65 ml/h with Prosource TF 45 ml BID to provide 2420 kcal, 127 gm protein, 1186 ml free water.  NUTRITION DIAGNOSIS:   Inadequate oral intake related to altered GI function, acute illness as evidenced by NPO status, per patient/family report.  Ongoing   GOAL:   Patient will meet greater than or equal to 90% of their needs  Progressing   MONITOR:   TF tolerance, Diet advancement, Weight trends, Labs  REASON FOR ASSESSMENT:   Rounds    ASSESSMENT:   34 yo male admitted with acute necrotizing pancreatitis, AKI. PMH includes EtOH abuse  Cortrak placed 11/22, tip in the distal duodenum, close to the ligament of Treitz per Radiology report. TF initiated 11/22 evening and was slowly advancing to goal until K and Phos resulted low. K is at 2.7 and phos is < 1, suspect related to refeeding syndrome. TF of Vital 1.5 has been decreased to 20 ml/h until electrolytes are repleted.    Intubated 11/23 due to progressive respiratory distress. S/P bronchoscopy, pigtail drain placement for L effusion and L mucus plugging.  Patient is currently intubated on ventilator support MV: 11.9 L/min Temp (24hrs), Avg:99 F (37.2 C), Min:98.3 F (36.8 C), Max:100.1 F (37.8 C)  Propofol: 26.5 ml/hr providing 700 kcal from lipid per day.  Labs reviewed. K 2.7, Phos < 1, mag 2.3 (up from 1.4) CBG: 201-261  Medications reviewed and include folic acid, novolog, MVI with minerals, thiamine, ketamine, potassium phosphate, magnesium sulfate, propofol.  Weight up to 91.8 kg today I/O +13.2 L since  admission   Diet Order:   Diet Order    None      EDUCATION NEEDS:   Not appropriate for education at this time  Skin:  Skin Assessment: Reviewed RN Assessment  Last BM:  600 ml via rectal tube x 24 hours  Height:   Ht Readings from Last 1 Encounters:  09/26/20 6\' 5"  (1.956 m)    Weight:   Wt Readings from Last 1 Encounters:  10/01/20 91.8 kg    BMI:  Body mass index is 24 kg/m.  Estimated Nutritional Needs:   Kcal:  2400-2600 kcals  Protein:  120-135 g  Fluid:  >/= 2.2 L    10/03/20, RD, LDN, CNSC Please refer to Amion for contact information.

## 2020-10-01 NOTE — Progress Notes (Addendum)
Pharmacy Electrolyte Replacement  Recent Labs:  Recent Labs    10/01/20 0658 10/01/20 1629 10/01/20 2027  K   < > 3.3* 3.0*  MG  --  2.0  --   PHOS   < >  --  1.8*  CREATININE   < > 1.05 0.84   < > = values in this interval not displayed.    Low Critical Values (K </= 2.5, Phos </= 1, Mg </= 1) Present: Concern with refeeding - K 3 after 2 of 6 runs given, Phos 1.8 post KPhos 45 mmol  MD Contacted: Dr. Violet Baldy  Plan:  Doreatha Martin the KCL x 6 runs ordered earlier this afternoon in response to the K 3.3 (4 runs left) KPhos 30 mmol IV x 1 (~40 mEq K) F/U AM labs - delay and obtain 2 hours after the completion of KPhos infusion (RN aware)  Chelsea Aus D. Laney Potash, PharmD, BCPS, BCCCP 10/01/2020, 9:51 PM

## 2020-10-01 NOTE — Progress Notes (Signed)
eLink Physician-Brief Progress Note Patient Name: Carlos [name] DOB: 1986-01-08 MRN: [id number]   Date of Service  10/01/2020  HPI/Events of Note  Notified of abnormal lab results which appear significantly different from yesterday's labs. Extraction done via PICC line. No sign of active bleed  eICU Interventions  Repeat CBC, BMP, Mg and phos stat     Intervention Category Intermediate Interventions: Diagnostic test evaluation  Darl Pikes 10/01/2020, 6:53 AM

## 2020-10-02 DIAGNOSIS — K852 Alcohol induced acute pancreatitis without necrosis or infection: Secondary | ICD-10-CM

## 2020-10-02 LAB — COMPREHENSIVE METABOLIC PANEL
ALT: 23 U/L (ref 0–44)
AST: 26 U/L (ref 15–41)
Albumin: 2.3 g/dL — ABNORMAL LOW (ref 3.5–5.0)
Alkaline Phosphatase: 92 U/L (ref 38–126)
Anion gap: 10 (ref 5–15)
BUN: 6 mg/dL (ref 6–20)
CO2: 25 mmol/L (ref 22–32)
Calcium: 7.9 mg/dL — ABNORMAL LOW (ref 8.9–10.3)
Chloride: 106 mmol/L (ref 98–111)
Creatinine, Ser: 1.03 mg/dL (ref 0.61–1.24)
GFR, Estimated: >60 mL/min (ref >60)
Glucose, Bld: 248 mg/dL — ABNORMAL HIGH (ref 70–99)
Potassium: 3.7 mmol/L (ref 3.5–5.1)
Sodium: 141 mmol/L (ref 135–145)
Total Bilirubin: 1.6 mg/dL — ABNORMAL HIGH (ref 0.3–1.2)
Total Protein: 5 g/dL — ABNORMAL LOW (ref 6.5–8.1)

## 2020-10-02 LAB — GLUCOSE, CAPILLARY
Glucose-Capillary: 147 mg/dL — ABNORMAL HIGH (ref 70–99)
Glucose-Capillary: 223 mg/dL — ABNORMAL HIGH (ref 70–99)
Glucose-Capillary: 163 mg/dL — ABNORMAL HIGH (ref 70–99)
Glucose-Capillary: 177 mg/dL — ABNORMAL HIGH (ref 70–99)
Glucose-Capillary: 188 mg/dL — ABNORMAL HIGH (ref 70–99)
Glucose-Capillary: 213 mg/dL — ABNORMAL HIGH (ref 70–99)

## 2020-10-02 LAB — CBC
HCT: 29.3 % — ABNORMAL LOW (ref 39.0–52.0)
Hemoglobin: 10.1 g/dL — ABNORMAL LOW (ref 13.0–17.0)
MCH: 36.1 pg — ABNORMAL HIGH (ref 26.0–34.0)
MCHC: 34.5 g/dL (ref 30.0–36.0)
MCV: 104.6 fL — ABNORMAL HIGH (ref 80.0–100.0)
Platelets: 243 10*3/uL (ref 150–400)
RBC: 2.8 MIL/uL — ABNORMAL LOW (ref 4.22–5.81)
RDW: 13.7 % (ref 11.5–15.5)
WBC: 8.4 10*3/uL (ref 4.0–10.5)
nRBC: 0 % (ref 0.0–0.2)

## 2020-10-02 LAB — MAGNESIUM
Magnesium: 2.1 mg/dL (ref 1.7–2.4)
Magnesium: 2.1 mg/dL (ref 1.7–2.4)

## 2020-10-02 LAB — BASIC METABOLIC PANEL
Anion gap: 8 (ref 5–15)
BUN: 6 mg/dL (ref 6–20)
CO2: 30 mmol/L (ref 22–32)
Calcium: 8.4 mg/dL — ABNORMAL LOW (ref 8.9–10.3)
Chloride: 102 mmol/L (ref 98–111)
Creatinine, Ser: 1.05 mg/dL (ref 0.61–1.24)
GFR, Estimated: >60 mL/min (ref >60)
Glucose, Bld: 214 mg/dL — ABNORMAL HIGH (ref 70–99)
Potassium: 3.7 mmol/L (ref 3.5–5.1)
Sodium: 140 mmol/L (ref 135–145)

## 2020-10-02 LAB — CULTURE, BLOOD (ROUTINE X 2)
Culture: NO GROWTH
Culture: NO GROWTH
Special Requests: ADEQUATE
Special Requests: ADEQUATE

## 2020-10-02 LAB — PHOSPHORUS
Phosphorus: 3.3 mg/dL (ref 2.5–4.6)
Phosphorus: 4.3 mg/dL (ref 2.5–4.6)

## 2020-10-02 MED ORDER — MAGNESIUM SULFATE IN D5W 1-5 GM/100ML-% IV SOLN
1.0000 g | Freq: Once | INTRAVENOUS | Status: AC
  Administered 2020-10-02: 1 g via INTRAVENOUS
  Filled 2020-10-02: qty 100

## 2020-10-02 MED ORDER — POTASSIUM CHLORIDE 20 MEQ PO PACK
40.0000 meq | PACK | Freq: Once | ORAL | Status: AC
  Administered 2020-10-02: 40 meq
  Filled 2020-10-02: qty 2

## 2020-10-02 MED ORDER — CHLORHEXIDINE GLUCONATE CLOTH 2 % EX PADS
6.0000 | MEDICATED_PAD | Freq: Every day | CUTANEOUS | Status: DC
  Administered 2020-10-02 – 2020-10-07 (×6): 6 via TOPICAL

## 2020-10-02 MED ORDER — ORAL CARE MOUTH RINSE
15.0000 mL | Freq: Two times a day (BID) | OROMUCOSAL | Status: DC
  Administered 2020-10-02 – 2020-10-05 (×6): 15 mL via OROMUCOSAL

## 2020-10-02 MED ORDER — PHENOBARBITAL SODIUM 130 MG/ML IJ SOLN
65.0000 mg | Freq: Three times a day (TID) | INTRAMUSCULAR | Status: AC
  Administered 2020-10-02 (×2): 65 mg via INTRAVENOUS
  Filled 2020-10-02 (×2): qty 1

## 2020-10-02 MED ORDER — POTASSIUM & SODIUM PHOSPHATES 280-160-250 MG PO PACK
2.0000 | PACK | Freq: Three times a day (TID) | ORAL | Status: DC
  Administered 2020-10-02 (×3): 2 via ORAL
  Filled 2020-10-02 (×5): qty 2

## 2020-10-02 MED ORDER — PHENOBARBITAL SODIUM 130 MG/ML IJ SOLN
30.0000 mg | Freq: Three times a day (TID) | INTRAMUSCULAR | Status: AC
  Administered 2020-10-03 – 2020-10-05 (×9): 30 mg via INTRAVENOUS
  Filled 2020-10-02 (×9): qty 1

## 2020-10-02 NOTE — Progress Notes (Addendum)
Patient extubated and continues to do well. Wife provided with updates, Brother at beside.

## 2020-10-02 NOTE — Progress Notes (Signed)
Wife updated of pt transfer out of ICU. Foley removed

## 2020-10-02 NOTE — Progress Notes (Signed)
NAME:  Carlos [name], MRN:  [id number], DOB:  11/10/1985, LOS: 6 ADMISSION DATE:  09/26/2020, CONSULTATION DATE:  09/28/2020 REFERRING MD:  Dr. Allena Katz, CHIEF COMPLAINT:  Pancreatitis w/necrosis   Brief History   34 year old admitted with acute pancreatitis, presumably due to ETOH abuse.  Developed clinical worsening on 11/21 with high CIWA scores, persistent tachycardia, tachypnea, new AKI, elevating t. Bili, and repeat CT showing new changes consistent with necrotizing pancreatitis.  GI and CCS following.  PCCM consulted for further ICU care.   History of present illness    34 year old male with no significant past medical history other than tobacco abuse and daily ETOH use.  Wife reports chronic history of ETOH, however very heavy over the last year; last drink on 11/18.  He presented on 11/19 with LUQ pain and non-bloody emesis.  Workup notable for lipase 2503, AST 150, ALT 108, t.bili 1. CT abd/ pelvis showing severe interstitial edematous pancreatitis with early acute pancreatic collections.  Admitted to [facility] with acute pancreatitis.  GI following.  On 11/21, he developed clinical worsening, with high CIWA scores > 20 on ativan, persistent tachycardia, tachypnea, new AKI, elevating t. Bili, and repeat CT showing new changes consistent with necrotizing pancreatitis.  PCCM consulted for ICU transfer and further management of aggressive resuscitation.    Past Medical History  Tobacco abuse, ETOH use (reported 8+ beers and liquor/ daily) Otherwise no pertinent past medical history   Significant Hospital Events   11/19 Admitted [facility] 11/21 PCCM consulted, tx ICU 11/23 intubation, bronchoscopy, left pigtail placement  Consults:  GI- [facility] PCCM  Procedures:   Significant Diagnostic Tests:  11/19 CT abd/ pelvis >> 1. Severe interstitial edematous pancreatitis with early acute pancreatic collections. 2. Diminished enhancement throughout the gland not definitive at this time for signs of  necrosis, perhaps due to severe pancreatic edema. Consider close follow-up based on clinical parameters to exclude the possibility of developing glandular necrosis. 3. Signs of ascites. 4. Small hiatal hernia and mild circumferential distal esophageal thickening.  Correlate with any clinical signs of esophagitis. 5. Probable hepatic steatosis.   11/19 limited RUQ [place] >> 1. Trace free fluid.  Hepatic steatosis. 2. Otherwise negative right upper quadrant   11/21 TTE >> LVEF 60-65%, no wall abnormalities, mild LVH, normal RV   11/21 CT abd w/contrast >> 1. Redemonstrated extensive inflammatory stranding about the pancreas and adjacent structures, with an interval increase in retroperitoneal inflammatory fluid. 2. There is new hypoenhancement of the majority of the pancreatic body and tail, involving approximately the distal 9.2 cm of the pancreas. 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 3. There is a new fluid collection of the anterior pancreatic head measuring 3.2 x 2.9 cm, consistent with developing pseudocyst. 4. New small volume ascites. 5. New moderate bilateral pleural effusions and associated atelectasis or consolidation.  Micro Data:  11/19 SARS 2/ Flu >> neg 11/20 BCx 2 >> 11/20 UC >> multiple species   Antimicrobials:  11/20 zosyn >> 11/25 planned  Interim history/subjective:  No events. CXR cleared up. Remains difficult to sedate on vent.  Objective   Blood pressure 104/69, pulse 86, temperature 99.9 F (37.7 C), temperature source Oral, resp. rate 20, height 6\' 5"  (1.956 m), weight 90.9 kg, SpO2 100 %.    Vent Mode: PCV FiO2 (%):  [40 %] 40 % Set Rate:  [20 bmp] 20 bmp PEEP:  [5 cmH20-8 cmH20] 5 cmH20 Plateau Pressure:  [20 cmH20-24 cmH20] 24 cmH20   Intake/Output  Summary (Last 24 hours) at 10/02/2020 0701 Last data filed at 10/02/2020 6144 Gross per 24 hour  Intake 2917.13 ml  Output 4205 ml  Net -1287.87 ml   Filed Weights   09/29/20 0500  10/01/20 0400 10/02/20 0500  Weight: 88.4 kg 91.8 kg 90.9 kg   Examination: Constitutional: young man in NAD  Eyes: eyes reactive Ears, nose, mouth, and throat: ETT in place, small thick secretions, trachea midline Cardiovascular: RRR, ext warm Respiratory: More clear, passive on vent Gastrointestinal: Soft, +BS Skin: No rashes, normal turgor Neurologic: flails all 4 ext when wakes up Psychiatric: cannot assess  Labs pending  Resolved Hospital Problem list   Acute kidney injury improved with IVF - Daily monitoring  Assessment & Plan:   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  - Close electrolyte monitoring and replacement - TF per discussion between RD, CCS, GI and CCM - Try to keep net neutral fluid-wise  Acute hypoxemic respiratory failure secondary to atelectasis and third spacing, effusions - CXR improved - Try SBT today  Acute metabolic encephalopathy secondary to alcohol withdrawal- on phenobarb taper. - Continue phenobarb taper as ordered - Wean propofol/fentanyl today - Continue CIWA ativan - Continue PO seqoeul  Elevated WBC, Pct- presumed infection, some question of cholangitis but bili argues against, exudative effusion and thick mucus endobronchially, question aspiration - Zosyn x 5 days reasonable, end date today  Illness- and pancreatitis-associated Hyperglycemia - SSI, levemir  Best practice:  Diet: per above Pain/Anxiety/Delirium protocol (if indicated): see above VAP protocol (if indicated): in place DVT prophylaxis: lovenox GI prophylaxis: PPI BID Glucose control: see above Mobility: BR Code Status: Full Family Communication: updated wife at bedside 10/01/20 Disposition: ICU Consulting Services: GI, CCS, PT, OT, RD   Patient critically ill due to respiratory failure Interventions to address this today ventilator weaning Risk of  deterioration without these interventions is high  I personally spent 34 minutes providing critical care not including any separately billable procedures  Myrla Halsted MD [facility] 10/02/2020 7:05 AM Personal pager: [phone] If unanswered, please page CCM On-call: [phone]

## 2020-10-02 NOTE — Progress Notes (Signed)
[facility] Gastroenterology Progress Note  Carlos [name] 34 y.o. 02-Jan-1986  CC: Acute necrotizing pancreatitis   Subjective: Patient seen and examined at bedside.  Remains intubated.  Discussed with RN at bedside.  Having good stool output.  Abdomen is soft.  ROS : Not able to obtain.   Objective: Vital signs in last 24 hours: Vitals:   10/02/20 0729 10/02/20 0800  BP:  109/75  Pulse:  74  Resp:  20  Temp: 98.8 F (37.1 C)   SpO2:  100%    Physical Exam:  General:   Intubated, not in acute distress  Head:  Normocephalic, without obvious abnormality, atraumatic  Eyes:  , EOM's intact,   Lungs:    Bibasilar Rales noted  Heart:  Regular rate and rhythm, S1, S2 normal  Abdomen:   Soft, minimally distended, nontender, bowel sounds present  Extremities: Extremities normal, atraumatic, no  edema  Pulses: 2+ and symmetric    Lab Results: Recent Labs    10/01/20 1629 10/01/20 1629 10/01/20 2027 10/02/20 0558  NA 140   < > 132* 141  K 3.3*   < > 3.0* 3.7  CL 102   < > 98 106  CO2 27   < > 23 25  GLUCOSE 198*   < > 209* 248*  BUN 7   < > 6 6  CREATININE 1.05   < > 0.84 1.03  CALCIUM 8.0*   < > 7.1* 7.9*  MG 2.0  --   --  2.1  PHOS  --   --  1.8* 3.3   < > = values in this interval not displayed.   Recent Labs    10/01/20 0423 10/02/20 0558  AST 14* 26  ALT 12 23  ALKPHOS 31* 92  BILITOT 1.0 1.6*  PROT <3.0* 5.0*  ALBUMIN 1.6* 2.3*   Recent Labs    10/01/20 0754 10/02/20 0558  WBC 8.4 8.4  HGB 8.9* 10.1*  HCT 26.3* 29.3*  MCV 106.5* 104.6*  PLT 146* 243   No results for input(s): LABPROT, INR in the last 72 hours.    Assessment/Plan: -Acute necrotizing pancreatitis.  Most likely alcohol induced -Respiratory failure.  Currently intubated  Recommendations -------------------------- -Continue supportive care.  Continue tube feeding.  Continue gentle IV hydration. -Okay to DC antibiotics.  Normal white counts now.  If he spikes fever, recommend to start  meropenem because of necrotizing pancreatitis. -Consider repeat CT scan next week. -Okay to extubate from GI standpoint -GI will follow  Kathi Der MD, FACP 10/02/2020, 9:28 AM  Contact #  [phone]

## 2020-10-02 NOTE — Progress Notes (Signed)
Pulled dose of phenobarbital and accidentally threw dose in sharps prior to given to patient . Another dose pulled in order for patient to receive medication

## 2020-10-02 NOTE — Procedures (Signed)
Extubation Procedure Note  Patient Details:   Name: Carlos [name] DOB: 31-Aug-1986 MRN: [id number]   Airway Documentation:    Vent end date: 10/02/20 Vent end time: 1010   Evaluation  O2 sats: stable throughout Complications: No apparent complications Patient did tolerate procedure well. Bilateral Breath Sounds: Rhonchi   No - patient not speaking at this time.    Positive cuff leak noted. Patient placed on [place] 4 L with humidity, no stridor noted.  [name] [name] 10/02/2020, 10:14 AM

## 2020-10-03 LAB — COMPREHENSIVE METABOLIC PANEL
ALT: 23 U/L (ref 0–44)
AST: 23 U/L (ref 15–41)
Albumin: 2.6 g/dL — ABNORMAL LOW (ref 3.5–5.0)
Alkaline Phosphatase: 118 U/L (ref 38–126)
Anion gap: 13 (ref 5–15)
BUN: 5 mg/dL — ABNORMAL LOW (ref 6–20)
CO2: 25 mmol/L (ref 22–32)
Calcium: 8.5 mg/dL — ABNORMAL LOW (ref 8.9–10.3)
Chloride: 99 mmol/L (ref 98–111)
Creatinine, Ser: 0.99 mg/dL (ref 0.61–1.24)
GFR, Estimated: >60 mL/min (ref >60)
Glucose, Bld: 208 mg/dL — ABNORMAL HIGH (ref 70–99)
Potassium: 3.8 mmol/L (ref 3.5–5.1)
Sodium: 137 mmol/L (ref 135–145)
Total Bilirubin: 2 mg/dL — ABNORMAL HIGH (ref 0.3–1.2)
Total Protein: 6 g/dL — ABNORMAL LOW (ref 6.5–8.1)

## 2020-10-03 LAB — GLUCOSE, CAPILLARY
Glucose-Capillary: 161 mg/dL — ABNORMAL HIGH (ref 70–99)
Glucose-Capillary: 191 mg/dL — ABNORMAL HIGH (ref 70–99)
Glucose-Capillary: 277 mg/dL — ABNORMAL HIGH (ref 70–99)
Glucose-Capillary: 261 mg/dL — ABNORMAL HIGH (ref 70–99)
Glucose-Capillary: 277 mg/dL — ABNORMAL HIGH (ref 70–99)
Glucose-Capillary: 204 mg/dL — ABNORMAL HIGH (ref 70–99)

## 2020-10-03 LAB — CBC
HCT: 30.4 % — ABNORMAL LOW (ref 39.0–52.0)
Hemoglobin: 10.3 g/dL — ABNORMAL LOW (ref 13.0–17.0)
MCH: 36.3 pg — ABNORMAL HIGH (ref 26.0–34.0)
MCHC: 33.9 g/dL (ref 30.0–36.0)
MCV: 107 fL — ABNORMAL HIGH (ref 80.0–100.0)
Platelets: 304 10*3/uL (ref 150–400)
RBC: 2.84 MIL/uL — ABNORMAL LOW (ref 4.22–5.81)
RDW: 13.3 % (ref 11.5–15.5)
WBC: 13.6 10*3/uL — ABNORMAL HIGH (ref 4.0–10.5)
nRBC: 0 % (ref 0.0–0.2)

## 2020-10-03 LAB — MAGNESIUM: Magnesium: 1.9 mg/dL (ref 1.7–2.4)

## 2020-10-03 LAB — PH, BODY FLUID: pH, Body Fluid: 7.4

## 2020-10-03 LAB — PHOSPHORUS: Phosphorus: 4.2 mg/dL (ref 2.5–4.6)

## 2020-10-03 MED ORDER — POTASSIUM & SODIUM PHOSPHATES 280-160-250 MG PO PACK
2.0000 | PACK | Freq: Three times a day (TID) | ORAL | Status: DC
  Filled 2020-10-03 (×2): qty 2

## 2020-10-03 MED ORDER — CLONAZEPAM 0.5 MG PO TABS
2.0000 mg | ORAL_TABLET | Freq: Three times a day (TID) | ORAL | Status: DC
  Administered 2020-10-03 – 2020-10-05 (×9): 2 mg via ORAL
  Filled 2020-10-03: qty 4
  Filled 2020-10-03: qty 2
  Filled 2020-10-03: qty 4
  Filled 2020-10-03: qty 2
  Filled 2020-10-03 (×5): qty 4

## 2020-10-03 MED ORDER — FOLIC ACID 1 MG PO TABS
1.0000 mg | ORAL_TABLET | Freq: Every day | ORAL | Status: DC
  Administered 2020-10-03 – 2020-10-07 (×5): 1 mg via ORAL
  Filled 2020-10-03 (×5): qty 1

## 2020-10-03 MED ORDER — BOOST / RESOURCE BREEZE PO LIQD CUSTOM
1.0000 | Freq: Three times a day (TID) | ORAL | Status: DC
  Administered 2020-10-03 – 2020-10-06 (×7): 1 via ORAL

## 2020-10-03 MED ORDER — QUETIAPINE FUMARATE 25 MG PO TABS
50.0000 mg | ORAL_TABLET | Freq: Two times a day (BID) | ORAL | Status: DC
  Administered 2020-10-03 – 2020-10-07 (×9): 50 mg via ORAL
  Filled 2020-10-03 (×3): qty 2
  Filled 2020-10-03: qty 1
  Filled 2020-10-03 (×5): qty 2

## 2020-10-03 MED ORDER — LOPERAMIDE HCL 2 MG PO CAPS
2.0000 mg | ORAL_CAPSULE | Freq: Four times a day (QID) | ORAL | Status: DC | PRN
  Administered 2020-10-03: 2 mg via ORAL
  Filled 2020-10-03 (×2): qty 1

## 2020-10-03 MED ORDER — THIAMINE HCL 100 MG/ML IJ SOLN: 100.0000 mg | Freq: Every day | INTRAMUSCULAR | Status: DC

## 2020-10-03 MED ORDER — POTASSIUM CHLORIDE 20 MEQ PO PACK
20.0000 meq | PACK | Freq: Once | ORAL | Status: AC
  Administered 2020-10-03: 20 meq via ORAL
  Filled 2020-10-03: qty 1

## 2020-10-03 MED ORDER — THIAMINE HCL 100 MG PO TABS
100.0000 mg | ORAL_TABLET | Freq: Every day | ORAL | Status: DC
  Administered 2020-10-03 – 2020-10-07 (×5): 100 mg via ORAL
  Filled 2020-10-03 (×5): qty 1

## 2020-10-03 MED ORDER — POTASSIUM & SODIUM PHOSPHATES 280-160-250 MG PO PACK
2.0000 | PACK | Freq: Three times a day (TID) | ORAL | Status: AC
  Administered 2020-10-03 – 2020-10-04 (×5): 2 via ORAL
  Filled 2020-10-03 (×5): qty 2

## 2020-10-03 MED ORDER — CLONAZEPAM 1 MG PO TABS: 1.0000 mg | ORAL_TABLET | Freq: Two times a day (BID) | ORAL | Status: DC

## 2020-10-03 MED ORDER — MAGNESIUM SULFATE 2 GM/50ML IV SOLN
2.0000 g | Freq: Once | INTRAVENOUS | Status: AC
  Administered 2020-10-03: 2 g via INTRAVENOUS
  Filled 2020-10-03: qty 50

## 2020-10-03 MED ORDER — POTASSIUM CHLORIDE CRYS ER 20 MEQ PO TBCR: 40.0000 meq | EXTENDED_RELEASE_TABLET | Freq: Once | ORAL | Status: DC

## 2020-10-03 MED ORDER — ADULT MULTIVITAMIN W/MINERALS CH
1.0000 | ORAL_TABLET | Freq: Every day | ORAL | Status: DC
  Administered 2020-10-03 – 2020-10-07 (×5): 1 via ORAL
  Filled 2020-10-03 (×5): qty 1

## 2020-10-03 MED ORDER — ACETAMINOPHEN 325 MG PO TABS
650.0000 mg | ORAL_TABLET | Freq: Four times a day (QID) | ORAL | Status: DC | PRN
  Administered 2020-10-04: 650 mg via ORAL
  Filled 2020-10-03: qty 2

## 2020-10-03 MED ORDER — AMLODIPINE BESYLATE 5 MG PO TABS
5.0000 mg | ORAL_TABLET | Freq: Every day | ORAL | Status: DC
  Administered 2020-10-03 – 2020-10-07 (×5): 5 mg via ORAL
  Filled 2020-10-03 (×5): qty 1

## 2020-10-03 MED ORDER — POLYETHYLENE GLYCOL 3350 17 G PO PACK: 17.0000 g | PACK | Freq: Every day | ORAL | Status: DC | PRN

## 2020-10-03 MED ORDER — ACETAMINOPHEN 650 MG RE SUPP: 650.0000 mg | Freq: Four times a day (QID) | RECTAL | Status: DC | PRN

## 2020-10-03 NOTE — Progress Notes (Addendum)
Pharmacy Electrolyte Replacement  Recent Labs:  Recent Labs    10/03/20 0605  K 3.8  MG 1.9  PHOS 4.2  CREATININE 0.99    Low Critical Values (K </= 2.5, Phos </= 1, Mg </= 1) Present: None  MD Contacted: n/a  Plan: KCl x1, Magnesium 2g x1 Continue current order for Phos-Nak - stop time in place.   Link Snuffer, PharmD, BCPS, BCCCP Clinical Pharmacist Please refer to [facility] Pharmacy numbers 10/03/2020, 7:04 AM

## 2020-10-03 NOTE — Progress Notes (Signed)
Writer called report to receiving nurse F. Paseda, RN. Patient alert at time of transfer to 4E. No signs and symptoms of acute distress noted at time of transfer.

## 2020-10-03 NOTE — Progress Notes (Signed)
NAME:  Carlos [name], MRN:  [id number], DOB:  1986/08/05, LOS: 7 ADMISSION DATE:  09/26/2020, CONSULTATION DATE:  09/28/2020 REFERRING MD:  Dr. Allena Katz, CHIEF COMPLAINT:  Pancreatitis w/necrosis   Brief History   34 year old admitted with acute pancreatitis, presumably due to ETOH abuse.  Developed clinical worsening on 11/21 with high CIWA scores, persistent tachycardia, tachypnea, new AKI, elevating t. Bili, and repeat CT showing new changes consistent with necrotizing pancreatitis.  GI and CCS following.  PCCM consulted for further ICU care.   History of present illness    34 year old male with no significant past medical history other than tobacco abuse and daily ETOH use.  Wife reports chronic history of ETOH, however very heavy over the last year; last drink on 11/18.  He presented on 11/19 with LUQ pain and non-bloody emesis.  Workup notable for lipase 2503, AST 150, ALT 108, t.bili 1. CT abd/ pelvis showing severe interstitial edematous pancreatitis with early acute pancreatic collections.  Admitted to [facility] with acute pancreatitis.  GI following.  On 11/21, he developed clinical worsening, with high CIWA scores > 20 on ativan, persistent tachycardia, tachypnea, new AKI, elevating t. Bili, and repeat CT showing new changes consistent with necrotizing pancreatitis.  PCCM consulted for ICU transfer and further management of aggressive resuscitation.    Past Medical History  Tobacco abuse, ETOH use (reported 8+ beers and liquor/ daily) Otherwise no pertinent past medical history   Significant Hospital Events   11/19 Admitted [facility] 11/21 PCCM consulted, tx ICU 11/23 intubation, bronchoscopy, left pigtail placement  Consults:  GI- [facility] PCCM  Procedures:   Significant Diagnostic Tests:  11/19 CT abd/ pelvis >> 1. Severe interstitial edematous pancreatitis with early acute pancreatic collections. 2. Diminished enhancement throughout the gland not definitive at this time for signs of  necrosis, perhaps due to severe pancreatic edema. Consider close follow-up based on clinical parameters to exclude the possibility of developing glandular necrosis. 3. Signs of ascites. 4. Small hiatal hernia and mild circumferential distal esophageal thickening.  Correlate with any clinical signs of esophagitis. 5. Probable hepatic steatosis.   11/19 limited RUQ [place] >> 1. Trace free fluid.  Hepatic steatosis. 2. Otherwise negative right upper quadrant   11/21 TTE >> LVEF 60-65%, no wall abnormalities, mild LVH, normal RV   11/21 CT abd w/contrast >> 1. Redemonstrated extensive inflammatory stranding about the pancreas and adjacent structures, with an interval increase in retroperitoneal inflammatory fluid. 2. There is new hypoenhancement of the majority of the pancreatic body and tail, involving approximately the distal 9.2 cm of the pancreas. 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 3. There is a new fluid collection of the anterior pancreatic head measuring 3.2 x 2.9 cm, consistent with developing pseudocyst. 4. New small volume ascites. 5. New moderate bilateral pleural effusions and associated atelectasis or consolidation.  Micro Data:  11/19 SARS 2/ Flu >> neg 11/20 BCx 2 >> 11/20 UC >> multiple species   Antimicrobials:  11/20 zosyn >> 11/25 planned  Interim history/subjective:  Pulled out cortrak, rectal tube, and foley overnight. Intermittent agitation despite PRN ativan.  Objective   Blood pressure (!) 167/97, pulse 88, temperature 100.3 F (37.9 C), temperature source Oral, resp. rate (!) 30, height 6\' 5"  (1.956 m), weight 90.9 kg, SpO2 100 %.    Vent Mode: PCV FiO2 (%):  [40 %] 40 % Set Rate:  [20 bmp] 20 bmp PEEP:  [5 cmH20] 5 cmH20 Plateau Pressure:  [16 cmH20] 16 cmH20  Intake/Output Summary (Last 24 hours) at 10/03/2020 0713 Last data filed at 10/03/2020 0600 Gross per 24 hour  Intake 1420.45 ml  Output 8085 ml  Net -6664.55 ml   Filed  Weights   10/01/20 0400 10/02/20 0500 10/03/20 0500  Weight: 91.8 kg 90.9 kg 90.9 kg   Examination: Constitutional: no acute distress  Eyes: EOMI, pupils equal Ears, nose, mouth, and throat: MM dry Cardiovascular: RRR, ext warm Respiratory: Mostly clear, no accessory muscle use Gastrointestinal: Soft, +BS Skin: No rashes, normal turgor Neurologic: moves all 4 ext to command Psychiatric: confused  Labs pending  Resolved Hospital Problem list   Acute kidney injury improved with IVF - Daily monitoring  Elevated WBC, Pct- presumed infection, some question of cholangitis but bili argues against, exudative effusion and thick mucus endobronchially, question aspiration - Zosyn x 5 days reasonable, end date today  Assessment & Plan:   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  - Continue close electrolyte monitoring and repletion - Need to discuss if we need to replace cortrak or give patient a shot with clear liquid diet  Acute hypoxemic respiratory failure secondary to atelectasis and third spacing, effusions, improved - Encourage IS, flutter - Chest tubes to suction for now, remove once output < 200/day  Acute metabolic encephalopathy secondary to alcohol withdrawal- on phenobarb taper. - Will lengthen phenobarb a bit - Continue CIWA ativan - Continue PO once we confirm okay with GI/CCS  Illness- and pancreatitis-associated Hyperglycemia - SSI  Best practice:  Diet: per above Pain/Anxiety/Delirium protocol (if indicated): see above VAP protocol (if indicated): in place DVT prophylaxis: lovenox GI prophylaxis: PPI BID Glucose control: see above Mobility: BR Code Status: Full Family Communication: will update when they come in Disposition: ICU Consulting Services: GI, CCS, PT, OT, RD  No ongoing ICU needs, will put in for progressive if there is a bed, appreciate [facility]  taking over care, pulmonary will follow for chest tube management.  Remaining issues - Delirium/withdrawal - PO tolerance - PT/OT - ?need for repeat imaging down the line  Myrla Halsted MD [facility] 10/03/2020 7:13 AM Personal pager: [phone] If unanswered, please page CCM On-call: #[phone]

## 2020-10-03 NOTE — Progress Notes (Signed)
Patient agitated throughout the night. Foley, cortrak, and flexiseal were all removed by patient during the night. Telemetry leads, nasal canula, and pulse ox were also removed by patient but were promptly replaced. PRN ativan was administered several times per [facility] for agitation. Elink made aware of situation. Will continue to monitor.

## 2020-10-03 NOTE — Progress Notes (Signed)
Nutrition Follow-up  DOCUMENTATION CODES:   Not applicable  INTERVENTION:    Boost Breeze po TID, each supplement provides 250 kcal and 9 grams of protein.  When diet is advanced, can change supplement to Ensure Enlive to provide more kcal and protein.  Continue MVI with minerals daily.  Diet advancement per GI team.  NUTRITION DIAGNOSIS:   Inadequate oral intake related to altered GI function, acute illness as evidenced by NPO status, per patient/family report.  Ongoing   GOAL:   Patient will meet greater than or equal to 90% of their needs  Progressing   MONITOR:   Diet advancement, PO intake, Supplement acceptance  REASON FOR ASSESSMENT:   Rounds    ASSESSMENT:   34 yo male admitted with acute necrotizing pancreatitis, AKI. PMH includes EtOH abuse  Extubated 11/25. Patient pulled out Cortrak, foley, and rectal tube over night. TF is off. Plans to trial clear liquids with diet advancement per GI team. If intake is suboptimal, recommend replace Cortrak tube.  Labs reviewed. K, phos, and mag are all WNL today.  CBG: 161-191  Medications reviewed and include folic acid, novolog, MVI, protonix, phos-nak, thiamine.  Weight 90.9 kg stable since yesterday, up from 76.2 kg on 11/21. I/O +6.1 L since admission UOP 1180 ml x 24 hours  Diet Order:   Diet Order            Diet clear liquid Room service appropriate? Yes; Fluid consistency: Thin  Diet effective now                 EDUCATION NEEDS:   Not appropriate for education at this time  Skin:  Skin Assessment: Reviewed RN Assessment  Last BM:  1400 ml x 24 hours, rectal tube since removed  Height:   Ht Readings from Last 1 Encounters:  09/26/20 6\' 5"  (1.956 m)    Weight:   Wt Readings from Last 1 Encounters:  10/03/20 90.9 kg    BMI:  Body mass index is 23.76 kg/m.  Estimated Nutritional Needs:   Kcal:  2400-2600 kcals  Protein:  120-135 g  Fluid:  >/= 2.2 L    10/05/20,  RD, LDN, CNSC Please refer to Amion for contact information.

## 2020-10-03 NOTE — Progress Notes (Signed)
Patient arrived on 4E from 39M, assessment completed see flow sheet , placed on tele cm notified , patient oriented to room and  Staff, bed in lowest position call bell within reach will continue to monitor.

## 2020-10-03 NOTE — Progress Notes (Signed)
Subjective: Patient extubated. No voiced complaints.  Objective: Vital signs in last 24 hours: Temp:  [98.5 F (36.9 C)-100.7 F (38.2 C)] 98.5 F (36.9 C) (11/26 1200) Pulse Rate:  [80-186] 127 (11/26 1200) Resp:  [16-50] 28 (11/26 1200) BP: (134-179)/(76-111) 142/95 (11/26 1200) SpO2:  [59 %-100 %] 98 % (11/26 1200) Weight:  [90.9 kg] 90.9 kg (11/26 0500) Weight change: 0 kg Last BM Date: 10/03/20  PE: GEN:  Extubated, somnolent but arousable ABD:  Soft, mild distention  Lab Results: CBC    Component Value Date/Time   WBC 13.6 (H) 10/03/2020 0605   RBC 2.84 (L) 10/03/2020 0605   HGB 10.3 (L) 10/03/2020 0605   HCT 30.4 (L) 10/03/2020 0605   PLT 304 10/03/2020 0605   MCV 107.0 (H) 10/03/2020 0605   MCH 36.3 (H) 10/03/2020 0605   MCHC 33.9 10/03/2020 0605   RDW 13.3 10/03/2020 0605   LYMPHSABS 688 (L) 07/03/2019 1435   EOSABS 428 07/03/2019 1435   BASOSABS 28 07/03/2019 1435   CMP     Component Value Date/Time   NA 137 10/03/2020 0605   K 3.8 10/03/2020 0605   CL 99 10/03/2020 0605   CO2 25 10/03/2020 0605   GLUCOSE 208 (H) 10/03/2020 0605   BUN 5 (L) 10/03/2020 0605   CREATININE 0.99 10/03/2020 0605   CREATININE 1.13 07/03/2019 1435   CALCIUM 8.5 (L) 10/03/2020 0605   PROT 6.0 (L) 10/03/2020 0605   ALBUMIN 2.6 (L) 10/03/2020 0605   AST 23 10/03/2020 0605   ALT 23 10/03/2020 0605   ALKPHOS 118 10/03/2020 0605   BILITOT 2.0 (H) 10/03/2020 0605   GFRNONAA >60 10/03/2020 0605   GFRNONAA 85 07/03/2019 1435   GFRAA  10/01/2020 1400    QUESTIONABLE RESULTS, RECOMMEND RECOLLECT TO VERIFY   GFRAA 98 07/03/2019 1435    Assessment:  1.  Acute severe necrotizing pancreatitis with fluid collection(s).  Suspected alcohol etiology.  Plan:  1.  Once patient more awake/alert, it is ok from GI perspective to try sips of clear liquids. 2.  Supportive management of pancreatitis (IVF, judicious analgesic, CIWA protocol). 3.  [facility] will follow.   Freddy Jaksch 10/03/2020, 12:23 PM   Cell [phone] If no answer or after 5 PM call [phone]

## 2020-10-03 NOTE — Progress Notes (Signed)
Physical Therapy Treatment Patient Details Name: Carlos [name] MRN: [id number] DOB: 09/30/1986 Today's Date: 10/03/2020    History of Present Illness 34 yo admitted 11/19 with LUQ pain and emesis with necrotizing pancreatitis from ETOH use. 11/21 pt with worsening tachycardia and tachypnea with new AKI and transfer to ICU. Intubated and bronchoscopy with chest tube 11/23, extubated 11/25. PMhx: tobacco and ETOH use    PT Comments    Pt with excellent mobility progression this session and able to maintain sats >90% on 6L with gait. Pt with maintained confusion and lack of awareness of situation and deficits but significantly improved from eval. Brother present during session and able to provide correction to pt statements. Will continue to follow and encouraged OOB to chair with nursing.   HR 115 with gait    Follow Up Recommendations  Home health PT;Supervision/Assistance - 24 hour     Equipment Recommendations  Rolling walker with 5" wheels    Recommendations for Other Services OT consult     Precautions / Restrictions Precautions Precautions: Fall Precaution Comments: cortrak Restrictions Weight Bearing Restrictions: No    Mobility  Bed Mobility Overal bed mobility: Needs Assistance Bed Mobility: Supine to Sit     Supine to sit: Min guard;HOB elevated     General bed mobility comments: cues for sequence with increased time  Transfers Overall transfer level: Needs assistance   Transfers: Sit to/from Stand Sit to Stand: Min assist         General transfer comment: cues for hand placement and assist to rise  Ambulation/Gait Ambulation/Gait assistance: Min assist Gait Distance (Feet): 150 Feet Assistive device: Rolling walker (2 wheeled) Gait Pattern/deviations: Step-through pattern;Decreased stride length;Trunk flexed   Gait velocity interpretation: <1.31 ft/sec, indicative of household ambulator General Gait Details: cues for posture and position in RW, assist  to direct RW with cues for direction, very slow gait with short strides   Stairs             Wheelchair Mobility    Modified Rankin (Stroke Patients Only)       Balance Overall balance assessment: Needs assistance   Sitting balance-Leahy Scale: Fair Sitting balance - Comments: EOb without support     Standing balance-Leahy Scale: Poor Standing balance comment: bil UE support on RW                            Cognition Arousal/Alertness: Awake/alert Behavior During Therapy: WFL for tasks assessed/performed Overall Cognitive Status: Impaired/Different from baseline Area of Impairment: Orientation;Attention;Memory;Following commands;Safety/judgement;Awareness;Problem solving                 Orientation Level: Disoriented to;Time;Place;Situation Current Attention Level: Sustained Memory: Decreased short-term memory Following Commands: Follows one step commands with increased time;Follows one step commands consistently Safety/Judgement: Decreased awareness of safety;Decreased awareness of deficits Awareness: Intellectual Problem Solving: Slow processing;Requires verbal cues;Requires tactile cues General Comments: pt stating he is in [employer], unable to recall his kids ages, unaware of deficits      Exercises      General Comments        Pertinent Vitals/Pain Pain Assessment: No/denies pain    Home Living                      Prior Function            PT Goals (current goals can now be found in the care plan section) Progress towards PT goals:  Progressing toward goals    Frequency           PT Plan Current plan remains appropriate    Co-evaluation              AM-PAC PT "6 Clicks" Mobility   Outcome Measure  Help needed turning from your back to your side while in a flat bed without using bedrails?: A Little Help needed moving from lying on your back to sitting on the side of a flat bed without using bedrails?:  A Little Help needed moving to and from a bed to a chair (including a wheelchair)?: A Little Help needed standing up from a chair using your arms (e.g., wheelchair or bedside chair)?: A Little Help needed to walk in hospital room?: A Little Help needed climbing 3-5 steps with a railing? : A Lot 6 Click Score: 17    End of Session Equipment Utilized During Treatment: Gait belt Activity Tolerance: Patient tolerated treatment well Patient left: in chair;with call bell/phone within reach;with family/visitor present;with chair alarm set Nurse Communication: Mobility status;Precautions PT Visit Diagnosis: Other abnormalities of gait and mobility (R26.89);Difficulty in walking, not elsewhere classified (R26.2)     Time: 7672-0947 PT Time Calculation (min) (ACUTE ONLY): 32 min  Charges:  $Gait Training: 8-22 mins $Therapeutic Activity: 8-22 mins                     Janeese Mcgloin P, PT Acute Rehabilitation Services Pager: [phone] Office: [phone]    Mescal Flinchbaugh B [place] 10/03/2020, 1:01 PM

## 2020-10-04 DIAGNOSIS — K8522 Alcohol induced acute pancreatitis with infected necrosis: Secondary | ICD-10-CM

## 2020-10-04 DIAGNOSIS — J90 Pleural effusion, not elsewhere classified: Secondary | ICD-10-CM

## 2020-10-04 LAB — GLUCOSE, CAPILLARY
Glucose-Capillary: 170 mg/dL — ABNORMAL HIGH (ref 70–99)
Glucose-Capillary: 177 mg/dL — ABNORMAL HIGH (ref 70–99)
Glucose-Capillary: 181 mg/dL — ABNORMAL HIGH (ref 70–99)
Glucose-Capillary: 203 mg/dL — ABNORMAL HIGH (ref 70–99)
Glucose-Capillary: 225 mg/dL — ABNORMAL HIGH (ref 70–99)
Glucose-Capillary: 270 mg/dL — ABNORMAL HIGH (ref 70–99)
Glucose-Capillary: 293 mg/dL — ABNORMAL HIGH (ref 70–99)

## 2020-10-04 LAB — COMPREHENSIVE METABOLIC PANEL
ALT: 24 U/L (ref 0–44)
AST: 25 U/L (ref 15–41)
Albumin: 2.6 g/dL — ABNORMAL LOW (ref 3.5–5.0)
Alkaline Phosphatase: 98 U/L (ref 38–126)
Anion gap: 14 (ref 5–15)
BUN: 6 mg/dL (ref 6–20)
CO2: 23 mmol/L (ref 22–32)
Calcium: 8.6 mg/dL — ABNORMAL LOW (ref 8.9–10.3)
Chloride: 99 mmol/L (ref 98–111)
Creatinine, Ser: 1.03 mg/dL (ref 0.61–1.24)
GFR, Estimated: >60 mL/min (ref >60)
Glucose, Bld: 188 mg/dL — ABNORMAL HIGH (ref 70–99)
Potassium: 3.8 mmol/L (ref 3.5–5.1)
Sodium: 136 mmol/L (ref 135–145)
Total Bilirubin: 1.4 mg/dL — ABNORMAL HIGH (ref 0.3–1.2)
Total Protein: 6.3 g/dL — ABNORMAL LOW (ref 6.5–8.1)

## 2020-10-04 LAB — CBC
HCT: 33.8 % — ABNORMAL LOW (ref 39.0–52.0)
Hemoglobin: 11.5 g/dL — ABNORMAL LOW (ref 13.0–17.0)
MCH: 36.1 pg — ABNORMAL HIGH (ref 26.0–34.0)
MCHC: 34 g/dL (ref 30.0–36.0)
MCV: 106 fL — ABNORMAL HIGH (ref 80.0–100.0)
Platelets: 360 10*3/uL (ref 150–400)
RBC: 3.19 MIL/uL — ABNORMAL LOW (ref 4.22–5.81)
RDW: 13 % (ref 11.5–15.5)
WBC: 12.5 10*3/uL — ABNORMAL HIGH (ref 4.0–10.5)
nRBC: 0 % (ref 0.0–0.2)

## 2020-10-04 LAB — PHOSPHORUS: Phosphorus: 3.6 mg/dL (ref 2.5–4.6)

## 2020-10-04 LAB — MAGNESIUM: Magnesium: 1.9 mg/dL (ref 1.7–2.4)

## 2020-10-04 NOTE — Progress Notes (Signed)
PROGRESS NOTE  Carlos [name] HTD:[id number] DOB: Mar 19, 1986 DOA: 09/26/2020 PCP: Lauree Chandler, NP  HPI/Recap of past 72 hours: 34 year old male with no significant past medical history other than tobacco abuse and daily ETOH use.  Wife reports chronic history of ETOH, however very heavy over the last year; last drink on 11/18.  He presented on 11/19 with LUQ pain and non-bloody emesis.  Workup notable for lipase 2503, AST 150, ALT 108, t.bili 1. CT abd/ pelvis showing severe interstitial edematous pancreatitis with early acute pancreatic collections.  Admitted to [facility] with acute pancreatitis.  GI following.  On 11/21, he developed clinical worsening, with high CIWA scores > 20 on ativan, persistent tachycardia, tachypnea, new AKI, elevating t. Bili, and repeat CT showing new changes consistent with necrotizing pancreatitis.  PCCM consulted for ICU transfer and further management of aggressive resuscitation.    10/04/20: He denies any pain this morning.  Denies any dyspnea at rest.  Bilateral chest tube in place on suction.  Pulmonary assisting with management of chest tubes.  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 Currently on room air with O2 saturation 98% on room air Chest tube managed by pulmonary, appreciate assistance  Alcohol use disorder No evidence of alcohol  withdrawal at time of this visit Continue to monitor  Essential hypertension Stable Continue current management Monitor BP  GERD Stable Continue PPI  Code Status: Full code  Family Communication: None at bedside  Disposition Plan: Likely will discharge to home when pulmonary signs off.   Consultants:  GI  Pulmonary  Procedures:  Chest tube placement  Antimicrobials:  None.  DVT prophylaxis: Subcu Lovenox daily.  Status is: Inpatient  Dispo: The patient is from: Home.              Anticipated d/c is to: Home.              Anticipated d/c date is: 09/27/2020              Patient currently not stable for discharge due to ongoing management of chest tubes.       Objective: Vitals:   10/04/20 0500 10/04/20 0827 10/04/20 1216 10/04/20 1533  BP: (!) 145/90 (!) 145/89 (!) 140/99 (!) 143/90  Pulse: 89 99 89 91  Resp: (!) 24 20 (!) 22 (!) 21  Temp: 98.5 F (36.9 C) 98.6 F (37 C) 98.9 F (37.2 C) 98.6 F (37 C)  TempSrc: Oral Oral Oral Oral  SpO2: 98% 95% 98% 98%  Weight: 82.7 kg     Height:        Intake/Output Summary (Last 24 hours) at 10/04/2020 1609 Last data filed at 10/04/2020 1220 Gross per 24 hour  Intake 400 ml  Output 710 ml  Net -310 ml   Filed Weights   10/03/20 0500 10/03/20 2108 10/04/20 0500  Weight: 90.9 kg 83.4 kg 82.7 kg    Exam:  . General: 34  y.o. year-old male well developed well nourished in no acute distress.  Alert and oriented x3. . Cardiovascular: Regular rate and rhythm with no rubs or gallops.  No thyromegaly or JVD noted.   Marland Kitchen Respiratory: Clear to auscultation with no wheezes or rales.  Poor inspiratory effort.  Chest tubes in place. . Abdomen: Soft nontender nondistended with normal bowel sounds x4 quadrants. . Musculoskeletal: No lower extremity edema. 2/4 pulses in all 4 extremities. . Skin: No ulcerative lesions noted or rashes, . Psychiatry: Mood is appropriate for condition and setting   Data  Reviewed: CBC: Recent Labs  Lab 10/01/20 0423 10/01/20 0754 10/02/20 0558 10/03/20 0605 10/04/20 0205  WBC 7.7 8.4 8.4 13.6* 12.5*  HGB 8.1* 8.9* 10.1* 10.3* 11.5*  HCT 23.2* 26.3* 29.3* 30.4* 33.8*  MCV 109.4* 106.5* 104.6* 107.0* 106.0*  PLT 125* 146* 243 304 500   Basic Metabolic Panel: Recent Labs  Lab 10/01/20 1629 10/01/20 1629 10/01/20 2027 10/02/20 0558 10/02/20 1813 10/03/20 0605 10/04/20 0205  NA 140   < > 132* 141 140 137 136  K 3.3*   < > 3.0* 3.7 3.7 3.8 3.8  CL 102   < > 98 106 102 99 99  CO2 27   < > _0 GLUCOSE 198*   < > 209* 248* 214* 208* 188*  BUN 7   < > _1 5* 6  CREATININE 1.05   < > 0.84 1.03 1.05 0.99 1.03  CALCIUM 8.0*   < > 7.1* 7.9* 8.4* 8.5* 8.6*  MG 2.0  --   --  2.1 2.1 1.9 1.9  PHOS  --   --  1.8* 3.3 4.3 4.2 3.6   < > = values in this interval not displayed.   GFR: Estimated Creatinine Clearance: 118.2 mL/min (by C-G formula based on SCr of 1.03 mg/dL). Liver Function Tests: Recent Labs  Lab 09/30/20 0430 10/01/20 0423 10/02/20 0558 10/03/20 0605 10/04/20 0205  AST 26 14* _2 ALT _3 ALKPHOS 53 31* 92 118 98  BILITOT 1.5* 1.0 1.6* 2.0* 1.4*  PROT 4.0* <3.0* 5.0* 6.0* 6.3*  ALBUMIN 1.6* 1.6* 2.3* 2.6* 2.6*   No results for input(s): LIPASE, AMYLASE in the last 168 hours. Recent Labs  Lab 09/29/20 0110  AMMONIA 43*   Coagulation Profile: No results for input(s): INR, PROTIME in the last 168 hours. Cardiac Enzymes: No results for input(s): CKTOTAL, CKMB, CKMBINDEX, TROPONINI in the last 168 hours. BNP (last 3 results) No results for input(s): PROBNP in the last 8760 hours. HbA1C: No results for input(s): HGBA1C in the last 72 hours. CBG: Recent Labs  Lab 10/03/20 2109 10/04/20 0040 10/04/20 0506 10/04/20 0813 10/04/20 1215  GLUCAP 204* 181* 177* 170* 270*   Lipid Profile: No results for input(s): CHOL, HDL, LDLCALC, TRIG, CHOLHDL, LDLDIRECT in the last 72 hours. Thyroid  Function Tests: No results for input(s): TSH, T4TOTAL, FREET4, T3FREE, THYROIDAB in the last 72 hours. Anemia Panel: No results for input(s): VITAMINB12, FOLATE, FERRITIN, TIBC, IRON, RETICCTPCT in the last 72 hours. Urine analysis:    Component Value Date/Time   COLORURINE AMBER (A) 09/28/2020 1020   APPEARANCEUR CLOUDY (A) 09/28/2020 1020   LABSPEC 1.032 (H) 09/28/2020 1020   PHURINE 5.0 09/28/2020 1020   GLUCOSEU >=500 (A) 09/28/2020 1020   HGBUR MODERATE (A) 09/28/2020 1020   BILIRUBINUR NEGATIVE 09/28/2020 1020   KETONESUR 20 (A) 09/28/2020 1020   PROTEINUR 100 (  A) 09/28/2020 1020   NITRITE NEGATIVE 09/28/2020 1020   LEUKOCYTESUR NEGATIVE 09/28/2020 1020   Sepsis Labs: _0 (procalcitonin:4,lacticidven:4)  ) Recent Results (from the past 240 hour(s))  Respiratory Panel by RT PCR (Flu A&B, Covid) - Nasopharyngeal Swab     Status: None   Collection Time: 09/26/20 11:11 AM   Specimen: Nasopharyngeal Swab; Nasopharyngeal(NP) swabs in vial transport medium  Result Value Ref Range Status   SARS Coronavirus 2 by RT PCR NEGATIVE NEGATIVE Final    Comment: (NOTE) SARS-CoV-2 target nucleic acids are NOT DETECTED.  The SARS-CoV-2 RNA is generally detectable in upper respiratoy specimens during the acute phase of infection. The lowest concentration of SARS-CoV-2 viral copies this assay can detect is 131 copies/mL. A negative result does not preclude SARS-Cov-2 infection and should not be used as the sole basis for treatment or other patient management decisions. A negative result may occur with  improper specimen collection/handling, submission of specimen other than nasopharyngeal swab, presence of viral mutation(s) within the areas targeted by this assay, and inadequate number of viral copies (<131 copies/mL). A negative result must be combined with clinical observations, patient history, and epidemiological information. The expected result is Negative.  Fact Sheet for  Patients:  PinkCheek.be  Fact Sheet for Healthcare Providers:  GravelBags.it  This test is no t yet approved or cleared by the [place] FDA and  has been authorized for detection and/or diagnosis of SARS-CoV-2 by FDA under an Emergency Use Authorization (EUA). This EUA will remain  in effect (meaning this test can be used) for the duration of the COVID-19 declaration under Section 564(b)(1) of the Act, 21 U.S.C. section 360bbb-3(b)(1), unless the authorization is terminated or revoked sooner.     Influenza A by PCR NEGATIVE NEGATIVE Final   Influenza B by PCR NEGATIVE NEGATIVE Final    Comment: (NOTE) The Xpert Xpress SARS-CoV-2/FLU/RSV assay is intended as an aid in  the diagnosis of influenza from Nasopharyngeal swab specimens and  should not be used as a sole basis for treatment. Nasal washings and  aspirates are unacceptable for Xpert Xpress SARS-CoV-2/FLU/RSV  testing.  Fact Sheet for Patients: PinkCheek.be  Fact Sheet for Healthcare Providers: GravelBags.it  This test is not yet approved or cleared by the [place] FDA and  has been authorized for detection and/or diagnosis of SARS-CoV-2 by  FDA under an Emergency Use Authorization (EUA). This EUA will remain  in effect (meaning this test can be used) for the duration of the  Covid-19 declaration under Section 564(b)(1) of the Act, 21  U.S.C. section 360bbb-3(b)(1), unless the authorization is  terminated or revoked. Performed at [facility], [place] [address] [place], [place] [address]   Urine Culture     Status: Abnormal   Collection Time: 09/27/20 10:01 AM   Specimen: Urine, Clean Catch  Result Value Ref Range Status   Specimen Description URINE, CLEAN CATCH  Final   Special Requests   Final    NONE Performed at [facility], [place] [address], [place], [place] [address]     Culture MULTIPLE SPECIES PRESENT, SUGGEST RECOLLECTION (A)  Final   Report Status 09/28/2020 FINAL  Final  Culture, blood (Routine X 2) w Reflex to ID Panel     Status: None   Collection Time: 09/27/20 10:36 AM   Specimen: BLOOD  Result Value Ref Range Status   Specimen Description BLOOD RIGHT ANTECUBITAL  Final   Special Requests   Final    BOTTLES DRAWN  AEROBIC AND ANAEROBIC Blood Culture adequate volume   Culture   Final    NO GROWTH 5 DAYS Performed at [facility], [place] [address] [place], [place] [address]    Report Status 10/02/2020 FINAL  Final  Culture, blood (Routine X 2) w Reflex to ID Panel     Status: None   Collection Time: 09/27/20 10:44 AM   Specimen: BLOOD RIGHT HAND  Result Value Ref Range Status   Specimen Description BLOOD RIGHT HAND  Final   Special Requests   Final    BOTTLES DRAWN AEROBIC AND ANAEROBIC Blood Culture adequate volume   Culture   Final    NO GROWTH 5 DAYS Performed at [facility], [place] [address], [place], [place] [address]    Report Status 10/02/2020 FINAL  Final  Culture, body fluid-bottle     Status: None (Preliminary result)   Collection Time: 09/30/20  7:32 PM   Specimen: Fluid  Result Value Ref Range Status   Specimen Description FLUID PLEURAL  Final   Special Requests BOTTLES DRAWN AEROBIC AND ANAEROBIC  Final   Culture   Final    NO GROWTH 4 DAYS Performed at [facility], [place] [address] [place], [place] [address]    Report Status PENDING  Incomplete  Gram stain     Status: None   Collection Time: 09/30/20  7:32 PM   Specimen: Fluid  Result Value Ref Range Status   Specimen Description FLUID PLEURAL  Final   Special Requests NONE  Final   Gram Stain   Final    RARE WBC PRESENT,BOTH PMN AND MONONUCLEAR NO ORGANISMS SEEN Performed at [facility], [address] [place],  [address]    Report Status 09/30/2020 FINAL  Final      Studies: No results found.  Scheduled Meds: . amLODipine  5  mg Oral Daily  . chlorhexidine gluconate (MEDLINE KIT)  15 mL Mouth Rinse BID  . Chlorhexidine Gluconate Cloth  6 each Topical Daily  . clonazePAM  2 mg Oral TID  . enoxaparin (LOVENOX) injection  40 mg Subcutaneous Daily  . feeding supplement  1 Container Oral TID BM  . folic acid  1 mg Oral Daily  . insulin aspart  0-15 Units Subcutaneous Q4H  . mouth rinse  15 mL Mouth Rinse BID  . multivitamin with minerals  1 tablet Oral Daily  . pantoprazole (PROTONIX) IV  40 mg Intravenous Q12H  . PHENObarbital  30 mg Intravenous TID  . QUEtiapine  50 mg Oral BID  . sodium chloride flush  10 mL Intracatheter Q8H  . sodium chloride flush  10-40 mL Intracatheter Q12H  . thiamine  100 mg Oral Daily    Continuous Infusions:   LOS: 8 days     Kayleen Memos, MD [facility] Pager [phone]  If 7PM-7AM, please contact night-coverage [email or web address] Password [facility] 10/04/2020, 4:09 PM

## 2020-10-04 NOTE — Progress Notes (Signed)
NAME:  Carlos [name], MRN:  [id number], DOB:  December 18, 1985, LOS: 8 ADMISSION DATE:  09/26/2020, CONSULTATION DATE:  09/28/2020 REFERRING MD:  Dr. Allena Katz, CHIEF COMPLAINT:  Pancreatitis w/necrosis   Brief History   34 year old admitted with acute pancreatitis, presumably due to ETOH abuse.  Developed clinical worsening on 11/21 with high CIWA scores, persistent tachycardia, tachypnea, new AKI, elevating t. Bili, and repeat CT showing new changes consistent with necrotizing pancreatitis.  GI and CCS following.   Past Medical History  Tobacco abuse, ETOH use (reported 8+ beers and liquor/ daily) Otherwise no pertinent past medical history   Significant Hospital Events   11/19 Admitted [facility] 11/21 PCCM consulted, tx ICU 11/23 intubation, bronchoscopy, left pigtail placement  Consults:  GI- [facility] PCCM  Procedures:   Significant Diagnostic Tests:  11/19 CT abd/ pelvis >> 1. Severe interstitial edematous pancreatitis with early acute pancreatic collections. 2. Diminished enhancement throughout the gland not definitive at this time for signs of necrosis, perhaps due to severe pancreatic edema. Consider close follow-up based on clinical parameters to exclude the possibility of developing glandular necrosis. 3. Signs of ascites. 4. Small hiatal hernia and mild circumferential distal esophageal thickening.  Correlate with any clinical signs of esophagitis. 5. Probable hepatic steatosis.   11/19 limited RUQ [place] >> 1. Trace free fluid.  Hepatic steatosis. 2. Otherwise negative right upper quadrant   11/21 TTE >> LVEF 60-65%, no wall abnormalities, mild LVH, normal RV   11/21 CT abd w/contrast >> 1. Redemonstrated extensive inflammatory stranding about the pancreas and adjacent structures, with an interval increase in retroperitoneal inflammatory fluid. 2. There is new hypoenhancement of the majority of the pancreatic body and tail, involving approximately the distal 9.2 cm of the pancreas.  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 3. There is a new fluid collection of the anterior pancreatic head measuring 3.2 x 2.9 cm, consistent with developing pseudocyst. 4. New small volume ascites. 5. New moderate bilateral pleural effusions and associated atelectasis or consolidation.  Micro Data:  11/19 SARS 2/ Flu >> neg 11/20 BCx 2 >> 11/20 UC >> multiple species   Antimicrobials:  11/20 zosyn >> 11/25 planned  Interim history/subjective:  Patient continued to have drainage from bilateral chest tubes about 130 cc each side in last 24 hours.  Currently both chest tubes are on suction  Objective   Blood pressure (!) 145/89, pulse 99, temperature 98.6 F (37 C), temperature source Oral, resp. rate 20, height 6\' 5"  (1.956 m), weight 82.7 kg, SpO2 95 %.        Intake/Output Summary (Last 24 hours) at 10/04/2020 1123 Last data filed at 10/04/2020 10/06/2020 Gross per 24 hour  Intake 620 ml  Output 860 ml  Net -240 ml   Filed Weights   10/03/20 0500 10/03/20 2108 10/04/20 0500  Weight: 90.9 kg 83.4 kg 82.7 kg   Examination: Constitutional: [name], lying on the bed Eyes: EOMI, pupils equal Ears, nose, mouth, and throat: MM dry Cardiovascular: RRR, ext warm Respiratory: Reduced air entry at the bases, no wheezes or rhonchi Gastrointestinal: Soft, nontender, nondistended, +BS Skin: No rashes, normal turgor Neurologic: Alert, awake, following commands, moving all 4 extremities spontaneously  Resolved Hospital Problem list   Acute kidney injury improved with IVF - Daily monitoring  Elevated WBC, Pct- presumed infection, some question of cholangitis but bili argues against, exudative effusion and thick mucus endobronchially, question aspiration - Zosyn x 5 days reasonable, end date today  Assessment & Plan:  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  Refeeding syndrome- related to prolonged PO and introduction of  TF.  Continue close electrolyte monitoring and repletion  Acute hypoxemic respiratory failure secondary to atelectasis and third spacing, effusions, improved Encourage IS, flutter Chest tubes to suction for now We will stop suction on chest tube today, repeat x-ray chest in the morning and if it did not show reaccumulation and chest tube output was less than 200 cc each side, will remove chest tubes  Acute metabolic encephalopathy secondary to alcohol withdrawal- on phenobarb taper. Will lengthen phenobarb a bit Continue CIWA ativan Continue PO once we confirm okay with GI/CCS   Best practice:  Diet: per above Pain/Anxiety/Delirium protocol (if indicated): see above VAP protocol (if indicated): in place DVT prophylaxis: lovenox GI prophylaxis: PPI BID Glucose control: see above Mobility: BR Code Status: Full Family Communication: Per primary team Disposition: Progressive care unit    Cheri Fowler MD Critical care physician [facility]  Pager: [phone] Mobile: [phone]

## 2020-10-04 NOTE — Progress Notes (Signed)
Subjective: No abdominal pain. Is hungry. Tolerating clear liquid diet.  Objective: Vital signs in last 24 hours: Temp:  [97.9 F (36.6 C)-98.9 F (37.2 C)] 98.9 F (37.2 C) (11/27 1216) Pulse Rate:  [34-102] 89 (11/27 1216) Resp:  [0-39] 22 (11/27 1216) BP: (128-145)/(86-99) 140/99 (11/27 1216) SpO2:  [67 %-99 %] 98 % (11/27 1216) Weight:  [82.7 kg-83.4 kg] 82.7 kg (11/27 0500) Weight change: -7.5 kg Last BM Date: 10/04/20  PE: GEN:  NAD, more interactive ABD:  Soft, non-tender  Lab Results: CBC    Component Value Date/Time   WBC 12.5 (H) 10/04/2020 0205   RBC 3.19 (L) 10/04/2020 0205   HGB 11.5 (L) 10/04/2020 0205   HCT 33.8 (L) 10/04/2020 0205   PLT 360 10/04/2020 0205   MCV 106.0 (H) 10/04/2020 0205   MCH 36.1 (H) 10/04/2020 0205   MCHC 34.0 10/04/2020 0205   RDW 13.0 10/04/2020 0205   LYMPHSABS 688 (L) 07/03/2019 1435   EOSABS 428 07/03/2019 1435   BASOSABS 28 07/03/2019 1435   CMP     Component Value Date/Time   NA 136 10/04/2020 0205   K 3.8 10/04/2020 0205   CL 99 10/04/2020 0205   CO2 23 10/04/2020 0205   GLUCOSE 188 (H) 10/04/2020 0205   BUN 6 10/04/2020 0205   CREATININE 1.03 10/04/2020 0205   CREATININE 1.13 07/03/2019 1435   CALCIUM 8.6 (L) 10/04/2020 0205   PROT 6.3 (L) 10/04/2020 0205   ALBUMIN 2.6 (L) 10/04/2020 0205   AST 25 10/04/2020 0205   ALT 24 10/04/2020 0205   ALKPHOS 98 10/04/2020 0205   BILITOT 1.4 (H) 10/04/2020 0205   GFRNONAA >60 10/04/2020 0205   GFRNONAA 85 07/03/2019 1435   GFRAA  10/01/2020 1400    QUESTIONABLE RESULTS, RECOMMEND RECOLLECT TO VERIFY   GFRAA 98 07/03/2019 1435   Assessment:  1.  Acute severe necrotizing pancreatitis with fluid collection(s).  Suspected alcohol etiology. 2.  Protein calorie malnutrition, moderate in severity. 3.  Leukocytosis, reactive from pancreatitis? 4.  Acute respiratory failure, extubated now, chest tube remains in place.  Plan:  1.  Patient tolerated clear liquid diet well,  and has benign abdominal exam and is hungry.  I feel it is ok to advance diet to full liquids. 2.  OK to take medications by mouth from GI perspective. 3.  Chest tube management per Pulmonary; hopefully able to remove in the next day or two. 4.  Minimize narcotics; OOBTC/ambulate halls as tolerated. 5.  [facility] will follow; if continues to improve, hopefully home in the next couple-to-few days; would need either way repeat CT scan in the next 2-3 weeks for reassessment.   Carlos [name] 10/04/2020, 1:45 PM   Cell [phone] If no answer or after 5 PM call [phone]

## 2020-10-04 NOTE — Evaluation (Signed)
Occupational Therapy Evaluation Patient Details Name: Carlos [name] MRN: [id number] DOB: August 31, 1986 Today's Date: 10/04/2020    History of Present Illness 34 yo admitted 11/19 with LUQ pain and emesis with necrotizing pancreatitis from ETOH use. 11/21 pt with worsening tachycardia and tachypnea with new AKI and transfer to ICU. Intubated and bronchoscopy with chest tube 11/23, extubated 11/25. PMhx: tobacco and ETOH use   Clinical Impression   PTA patient reports independent with ADLs, mobility, working at [employer] part time.  Admitted for above and limited by problem list below, including generalized weakness, decreased activity tolerance, impaired balance, and impaired cognition.  Patient lethargic during session, appears fatigued but agreeable to OT session.  Patients spouse at side and corrects report of home setup, but patient oriented today (to self, time, situation), following simple commands with increased time and cueing, but noted decreased problem solving, awareness and STM.  He completes bed mobility with min guard assist, transfers with min assist and ADLs with min-mod assist. Session limited as HR up to 132 with minimal activity standing at EOB (only recovering once sitting).  Believe he will benefit from continued OT services while admitted and after dc at [facility] level to optimize independence and safety with ADLs, mobility given 24/7 support. Will follow.     Follow Up Recommendations  Home health OT;Supervision/Assistance - 24 hour    Equipment Recommendations  3 in 1 bedside commode    Recommendations for Other Services       Precautions / Restrictions Precautions Precautions: Fall Precaution Comments: bil chest tubes Restrictions Weight Bearing Restrictions: No      Mobility Bed Mobility Overal bed mobility: Needs Assistance Bed Mobility: Supine to Sit;Sit to Supine     Supine to sit: Min guard;HOB elevated Sit to supine: Min guard   General bed mobility comments: min  guard for safety, assist for line mgmt; increased time required    Transfers Overall transfer level: Needs assistance Equipment used: Rolling walker (2 wheeled) Transfers: Sit to/from Stand Sit to Stand: Min assist         General transfer comment: min assist for hand placement and phyiscal assist to rise    Balance Overall balance assessment: Needs assistance Sitting-balance support: No upper extremity supported;Feet supported Sitting balance-Leahy Scale: Fair Sitting balance - Comments: dynamically with supervision    Standing balance support: Bilateral upper extremity supported;During functional activity Standing balance-Leahy Scale: Poor Standing balance comment: relies on RW                            ADL either performed or assessed with clinical judgement   ADL Overall ADL's : Needs assistance/impaired     Grooming: Sitting;Set up   Upper Body Bathing: Minimal assistance;Sitting   Lower Body Bathing: Minimal assistance;Sit to/from stand   Upper Body Dressing : Minimal assistance;Sitting   Lower Body Dressing: Sit to/from stand;Moderate assistance Lower Body Dressing Details (indicate cue type and reason): able to pull up socks, but anticipate assist to don; min assist sit to stand  Toilet Transfer: Minimal assistance;RW Toilet Transfer Details (indicate cue type and reason): simulated in room         Functional mobility during ADLs: Minimal assistance;Rolling walker;Cueing for safety General ADL Comments: pt limited by lethargy, decreased activity tolerance, weakness and cognition     Vision         Perception     Praxis      Pertinent Vitals/Pain Pain Assessment: No/denies pain  Hand Dominance     Extremity/Trunk Assessment Upper Extremity Assessment Upper Extremity Assessment: Generalized weakness   Lower Extremity Assessment Lower Extremity Assessment: Defer to PT evaluation   Cervical / Trunk Assessment Cervical / Trunk  Assessment: Normal   Communication Communication Communication: No difficulties   Cognition Arousal/Alertness: Lethargic Behavior During Therapy: Flat affect Overall Cognitive Status: Impaired/Different from baseline Area of Impairment: Attention;Memory;Following commands;Safety/judgement;Awareness;Problem solving                   Current Attention Level: Sustained Memory: Decreased short-term memory Following Commands: Follows one step commands consistently;Follows one step commands with increased time;Follows multi-step commands inconsistently Safety/Judgement: Decreased awareness of safety;Decreased awareness of deficits Awareness: Emergent Problem Solving: Slow processing;Requires verbal cues;Requires tactile cues General Comments: patient oriented to self, situation and time, follows 1 step commands with increased time and cueing; poor safety awareness, STM (spouse present and correcting home setup)   General Comments  HR up to 132 with minimal OOB activity     Exercises     Shoulder Instructions      Home Living Family/patient expects to be discharged to:: Private residence Living Arrangements: Spouse/significant other Available Help at Discharge: Family;Available 24 hours/day Type of Home: Mobile home Home Access: Stairs to enter Entrance Stairs-Number of Steps: 4   Home Layout: One level     Bathroom Shower/Tub: Chief Strategy Officer: Standard     Home Equipment: None          Prior Functioning/Environment Level of Independence: Independent        Comments: pt normally independent, had just gotten a job with [employer]. has 3 sons and a 3yo daughter        OT Problem List: Decreased strength;Decreased activity tolerance;Impaired balance (sitting and/or standing);Decreased cognition;Decreased safety awareness;Decreased knowledge of use of DME or AE;Decreased knowledge of precautions;Cardiopulmonary status limiting activity      OT  Treatment/Interventions: Self-care/ADL training;Therapeutic exercise;DME and/or AE instruction;Therapeutic activities;Patient/family education;Balance training;Cognitive remediation/compensation;Energy conservation    OT Goals(Current goals can be found in the care plan section) Acute Rehab OT Goals Patient Stated Goal: return home OT Goal Formulation: With patient Time For Goal Achievement: 10/18/20 Potential to Achieve Goals: Good  OT Frequency: Min 2X/week   Barriers to D/C:            Co-evaluation              AM-PAC OT "6 Clicks" Daily Activity     Outcome Measure Help from another person eating meals?: A Little Help from another person taking care of personal grooming?: A Little Help from another person toileting, which includes using toliet, bedpan, or urinal?: A Lot Help from another person bathing (including washing, rinsing, drying)?: A Little Help from another person to put on and taking off regular upper body clothing?: A Little Help from another person to put on and taking off regular lower body clothing?: A Little 6 Click Score: 17   End of Session Equipment Utilized During Treatment: Gait belt;Rolling walker Nurse Communication: Mobility status;Other (comment) (HR)  Activity Tolerance: Patient tolerated treatment well Patient left: with call bell/phone within reach;in bed;with bed alarm set;with family/visitor present  OT Visit Diagnosis: Other abnormalities of gait and mobility (R26.89);Muscle weakness (generalized) (M62.81);Other symptoms and signs involving cognitive function                Time: 9323-5573 OT Time Calculation (min): 18 min Charges:  OT General Charges $OT Visit: 1 Visit OT Evaluation $OT  Eval Moderate Complexity: 1 Mod  Barry Brunner, OT Acute Rehabilitation Services Pager [phone] Office [phone]   Chancy Milroy 10/04/2020, 12:28 PM

## 2020-10-05 ENCOUNTER — Inpatient Hospital Stay (HOSPITAL_COMMUNITY): Payer: Medicaid Other

## 2020-10-05 LAB — CBC WITH DIFFERENTIAL/PLATELET
Abs Immature Granulocytes: 0.32 10*3/uL — ABNORMAL HIGH (ref 0.00–0.07)
Basophils Absolute: 0.1 10*3/uL (ref 0.0–0.1)
Basophils Relative: 0 %
Eosinophils Absolute: 0.4 10*3/uL (ref 0.0–0.5)
Eosinophils Relative: 3 %
HCT: 31.7 % — ABNORMAL LOW (ref 39.0–52.0)
Hemoglobin: 10.8 g/dL — ABNORMAL LOW (ref 13.0–17.0)
Immature Granulocytes: 3 %
Lymphocytes Relative: 9 %
Lymphs Abs: 1.1 10*3/uL (ref 0.7–4.0)
MCH: 35.5 pg — ABNORMAL HIGH (ref 26.0–34.0)
MCHC: 34.1 g/dL (ref 30.0–36.0)
MCV: 104.3 fL — ABNORMAL HIGH (ref 80.0–100.0)
Monocytes Absolute: 1 10*3/uL (ref 0.1–1.0)
Monocytes Relative: 9 %
Neutro Abs: 8.5 10*3/uL — ABNORMAL HIGH (ref 1.7–7.7)
Neutrophils Relative %: 76 %
Platelets: 449 10*3/uL — ABNORMAL HIGH (ref 150–400)
RBC: 3.04 MIL/uL — ABNORMAL LOW (ref 4.22–5.81)
RDW: 12.6 % (ref 11.5–15.5)
WBC: 11.4 10*3/uL — ABNORMAL HIGH (ref 4.0–10.5)
nRBC: 0 % (ref 0.0–0.2)

## 2020-10-05 LAB — COMPREHENSIVE METABOLIC PANEL
ALT: 30 U/L (ref 0–44)
AST: 32 U/L (ref 15–41)
Albumin: 2.6 g/dL — ABNORMAL LOW (ref 3.5–5.0)
Alkaline Phosphatase: 85 U/L (ref 38–126)
Anion gap: 11 (ref 5–15)
BUN: 5 mg/dL — ABNORMAL LOW (ref 6–20)
CO2: 25 mmol/L (ref 22–32)
Calcium: 8.8 mg/dL — ABNORMAL LOW (ref 8.9–10.3)
Chloride: 100 mmol/L (ref 98–111)
Creatinine, Ser: 1 mg/dL (ref 0.61–1.24)
GFR, Estimated: >60 mL/min (ref >60)
Glucose, Bld: 182 mg/dL — ABNORMAL HIGH (ref 70–99)
Potassium: 3.8 mmol/L (ref 3.5–5.1)
Sodium: 136 mmol/L (ref 135–145)
Total Bilirubin: 1 mg/dL (ref 0.3–1.2)
Total Protein: 6.3 g/dL — ABNORMAL LOW (ref 6.5–8.1)

## 2020-10-05 LAB — GLUCOSE, CAPILLARY
Glucose-Capillary: 156 mg/dL — ABNORMAL HIGH (ref 70–99)
Glucose-Capillary: 148 mg/dL — ABNORMAL HIGH (ref 70–99)
Glucose-Capillary: 206 mg/dL — ABNORMAL HIGH (ref 70–99)
Glucose-Capillary: 260 mg/dL — ABNORMAL HIGH (ref 70–99)
Glucose-Capillary: 264 mg/dL — ABNORMAL HIGH (ref 70–99)

## 2020-10-05 LAB — CULTURE, BODY FLUID W GRAM STAIN -BOTTLE: Culture: NO GROWTH

## 2020-10-05 LAB — PROCALCITONIN: Procalcitonin: 0.67 ng/mL

## 2020-10-05 MED ORDER — INSULIN GLARGINE 100 UNIT/ML ~~LOC~~ SOLN
5.0000 [IU] | Freq: Every day | SUBCUTANEOUS | Status: DC
  Administered 2020-10-05 – 2020-10-07 (×3): 5 [IU] via SUBCUTANEOUS
  Filled 2020-10-05 (×3): qty 0.05

## 2020-10-05 NOTE — Progress Notes (Signed)
Plan of care reviewed. Pt's been progressing. He's hemodynamically stable. NSR on monitor.Remained afebrile. He slept well last night. Denied pain. Chest tube had no drainage increased since day shift yesterday. Continue to monitor.  Filiberto Pinks, RN

## 2020-10-05 NOTE — Progress Notes (Signed)
Subjective: No abdominal pain. Tolerating diet.  Objective: Vital signs in last 24 hours: Temp:  [98.1 F (36.7 C)-98.8 F (37.1 C)] 98.4 F (36.9 C) (11/28 1208) Pulse Rate:  [82-96] 96 (11/28 1208) Resp:  [20-31] 20 (11/28 1208) BP: (103-143)/(82-95) 135/89 (11/28 1208) SpO2:  [97 %-100 %] 97 % (11/28 1208) Weight:  [81.1 kg] 81.1 kg (11/28 0527) Weight change: -2.3 kg Last BM Date: 10/04/20  PE: GEN:  NAD LUNGS:  Chest tube in place ABD:  Soft, non-tender  Lab Results: CBC    Component Value Date/Time   WBC 11.4 (H) 10/05/2020 0518   RBC 3.04 (L) 10/05/2020 0518   HGB 10.8 (L) 10/05/2020 0518   HCT 31.7 (L) 10/05/2020 0518   PLT 449 (H) 10/05/2020 0518   MCV 104.3 (H) 10/05/2020 0518   MCH 35.5 (H) 10/05/2020 0518   MCHC 34.1 10/05/2020 0518   RDW 12.6 10/05/2020 0518   LYMPHSABS 1.1 10/05/2020 0518   MONOABS 1.0 10/05/2020 0518   EOSABS 0.4 10/05/2020 0518   BASOSABS 0.1 10/05/2020 0518   CMP     Component Value Date/Time   NA 136 10/05/2020 0518   K 3.8 10/05/2020 0518   CL 100 10/05/2020 0518   CO2 25 10/05/2020 0518   GLUCOSE 182 (H) 10/05/2020 0518   BUN 5 (L) 10/05/2020 0518   CREATININE 1.00 10/05/2020 0518   CREATININE 1.13 07/03/2019 1435   CALCIUM 8.8 (L) 10/05/2020 0518   PROT 6.3 (L) 10/05/2020 0518   ALBUMIN 2.6 (L) 10/05/2020 0518   AST 32 10/05/2020 0518   ALT 30 10/05/2020 0518   ALKPHOS 85 10/05/2020 0518   BILITOT 1.0 10/05/2020 0518   GFRNONAA >60 10/05/2020 0518   GFRNONAA 85 07/03/2019 1435   GFRAA  10/01/2020 1400    QUESTIONABLE RESULTS, RECOMMEND RECOLLECT TO VERIFY   GFRAA 98 07/03/2019 1435   Assessment:  1. Acute severe necrotizing pancreatitis with fluid collection(s). Suspected alcohol etiology. 2.  Protein calorie malnutrition, moderate in severity. 3.  Leukocytosis, reactive from pancreatitis? 4.  Acute respiratory failure, extubated now, chest tube remains in place.  Plan:  1.  Advance diet to soft, low fat  diet.  Would not advance beyond this for the next couple weeks. 2.  Chest tube management (hopefully removal soon?) per pulmonary team. 3.  No further GI testing planned as inpatient; will need repeat CT scan in the next several weeks, pending his clinical course. 4.  [facility] will sign-off; please call with questions; we will arrange outpatient follow-up with [place].   Carlos [name] 10/05/2020, 2:22 PM   Cell [phone] If no answer or after 5 PM call [phone]

## 2020-10-05 NOTE — TOC Initial Note (Addendum)
Transition of Care [facility] - Initial/Assessment Note    Patient Details  Name: Carlos [name] MRN: [id number] Date of Birth: [birth year]  Transition of [facility] CM/SW Contact:    Lawerance Sabal, RN Phone Number: 10/05/2020, 10:21 AM  Clinical Narrative:                [name] w patient at bedside about DC plan and PT recs. Patient would like RW, declined 3/1.  RW  requested from [employer] to be delivered to room 11/28 We discussed HH recs. CM reached out to [facility] who is charity provider, no response. Patient mentioned that he will be more ambulatory once he gets home and will get up and move around with his children. We discussed alternatives to [facility], amd he is agreeable for a referral to outpt PT.  Currently continues with chest tube.  At this time will need OP PT referral prior to DC.     Expected Discharge Plan: Home/Self Care Barriers to Discharge: Continued Medical Work up   Patient Goals and CMS Choice Patient states their goals for this hospitalization and ongoing recovery are:: to go home      Expected Discharge Plan and Services Expected Discharge Plan: Home/Self Care   Discharge Planning Services: CM Consult Post Acute Care Choice: Durable Medical Equipment                                        Prior Living Arrangements/Services   Lives with:: Spouse, Minor Children                   Activities of Daily Living      Permission Sought/Granted                  Emotional Assessment              Admission diagnosis:  Acute alcoholic pancreatitis [K85.20] Patient Active Problem List   Diagnosis Date Noted  . History of ETT   . Hyperglycemia 09/27/2020  . Hyponatremia 09/27/2020  . Transaminitis 09/27/2020  . Severe sepsis (HCC) 09/27/2020  . Alcohol withdrawal delirium (HCC)   . Acute alcoholic pancreatitis 09/26/2020  . Alcohol dependence syndrome (HCC) 09/26/2020  . Tobacco dependence 09/26/2020   PCP:  Sharon Seller,  NP Pharmacy:   [facility] [address] [place]), [place] - [address] [place] [address] Phone: [phone] Fax: [phone]     Social Determinants of Health (SDOH) Interventions    Readmission Risk Interventions No flowsheet data found.

## 2020-10-05 NOTE — Progress Notes (Signed)
PROGRESS NOTE  Carlos [name] CEY:[id number] DOB: 04-24-1986 DOA: 09/26/2020 PCP: Lauree Chandler, NP  HPI/Recap of past 51 hours: 34 year old male with no significant past medical history other than tobacco abuse and daily ETOH use.  Wife reports chronic history of ETOH, however very heavy over the last year; last drink on 11/18.  He presented on 11/19 with LUQ pain and non-bloody emesis.  Workup notable for lipase 2503, AST 150, ALT 108, t.bili 1. CT abd/ pelvis showing severe interstitial edematous pancreatitis with early acute pancreatic collections.  Admitted to [facility] with acute pancreatitis.  GI following.  On 11/21, he developed clinical worsening, with high CIWA scores > 20 on ativan, persistent tachycardia, tachypnea, new AKI, elevating t. Bili, and repeat CT showing new changes consistent with necrotizing pancreatitis.  PCCM consulted for ICU transfer and further management of aggressive resuscitation.    10/05/20: Seen and examined at his bedside this morning he has no new complaints.  Denies any chest pain or dyspnea at rest.  Bilateral chest tubes in place.  He is tolerating a full liquid diet.  No nausea or significant abdominal pain in the setting of necrotizing pancreatitis.  There were no acute events overnight.  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  Defer to GI to advance diet as tolerated. Avoid narcotics Mobilize as tolerated  Resolved hypoxic respiratory failure, bilateral chest tubes in place on suction. Currently on room air with O2  saturation 98% on room air Chest tube managed by pulmonary, appreciate assistance Independently chest x-ray done on 10/05/2020 which shows no evidence of atelectasis or pneumothorax, bilateral chest tubes in place.  Alcohol use disorder No evidence of alcohol withdrawal at time of this visit Continue CIWA protocol Currently he is on Klonopin scheduled 2 mg 3 times daily, IV phenobarbital 30 mg 3 times daily Wean off as tolerated  Essential hypertension BP is at goal Continue current management, Norvasc 5 mg daily Continue to monitor vital signs  GERD Stable Continue PPI  Code Status: Full code  Family Communication: None at bedside  Disposition Plan: Likely will discharge to home when chest tubes are removed and pulmonary signs off.   Consultants:  GI  Pulmonary  Procedures:  Chest tube placement  Antimicrobials:  None.  DVT prophylaxis: Subcu Lovenox daily.  Status is: Inpatient  Dispo: The patient is from: Home.              Anticipated d/c is to: Home.              Anticipated d/c date is: 10/07/2020              Patient currently not stable for discharge due to ongoing management of chest tubes.       Objective: Vitals:   10/05/20 0527 10/05/20 0600 10/05/20 0802 10/05/20 1208  BP:   (!) 135/95 135/89  Pulse:  88 84 96  Resp: (!) 24 (!) _0 Temp:   98.1 F (36.7 C) 98.4 F (36.9 C)  TempSrc:   Oral Oral  SpO2:  99% 100% 97%  Weight: 81.1  kg     Height:        Intake/Output Summary (Last 24 hours) at 10/05/2020 1337 Last data filed at 10/05/2020 1309 Gross per 24 hour  Intake 710 ml  Output 1225 ml  Net -515 ml   Filed Weights   10/03/20 2108 10/04/20 0500 10/05/20 0527  Weight: 83.4 kg 82.7 kg 81.1 kg    Exam:  . General: 34 y.o. year-old male further evaluation no distress.  Alert and oriented x3.   . Cardiovascular: Regular rate and rhythm no rubs or gallops.  Respiratory: Clear to auscultation no wheeze or rales.  Poor  inspiratory effort.  Bilateral chest tubes in place. . Abdomen: Soft nontender no bowel sounds present. . Musculoskeletal: No lower extremity edema bilaterally.   Marland Kitchen Psychiatry: Mood is appropriate for condition   Data Reviewed: CBC: Recent Labs  Lab 10/01/20 0754 10/02/20 0558 10/03/20 0605 10/04/20 0205 10/05/20 0518  WBC 8.4 8.4 13.6* 12.5* 11.4*  NEUTROABS  --   --   --   --  8.5*  HGB 8.9* 10.1* 10.3* 11.5* 10.8*  HCT 26.3* 29.3* 30.4* 33.8* 31.7*  MCV 106.5* 104.6* 107.0* 106.0* 104.3*  PLT 146* 243 304 360 101*   Basic Metabolic Panel: Recent Labs  Lab 10/01/20 1629 10/01/20 1629 10/01/20 2027 10/01/20 2027 10/02/20 0558 10/02/20 1813 10/03/20 0605 10/04/20 0205 10/05/20 0518  NA 140   < > 132*   < > 141 140 137 136 136  K 3.3*   < > 3.0*   < > 3.7 3.7 3.8 3.8 3.8  CL 102   < > 98   < > 106 102 99 99 100  CO2 27   < > 23   < > _0 GLUCOSE 198*   < > 209*   < > 248* 214* 208* 188* 182*  BUN 7   < > 6   < > 6 6 5* 6 5*  CREATININE 1.05   < > 0.84   < > 1.03 1.05 0.99 1.03 1.00  CALCIUM 8.0*   < > 7.1*   < > 7.9* 8.4* 8.5* 8.6* 8.8*  MG 2.0  --   --   --  2.1 2.1 1.9 1.9  --   PHOS  --   --  1.8*  --  3.3 4.3 4.2 3.6  --    < > = values in this interval not displayed.   GFR: Estimated Creatinine Clearance: 119.4 mL/min (by C-G formula based on SCr of 1 mg/dL). Liver Function Tests: Recent Labs  Lab 10/01/20 0423 10/02/20 0558 10/03/20 0605 10/04/20 0205 10/05/20 0518  AST 14* _1 32  ALT _2 ALKPHOS 31* 92 118 98 85  BILITOT 1.0 1.6* 2.0* 1.4* 1.0  PROT <3.0* 5.0* 6.0* 6.3* 6.3*  ALBUMIN 1.6* 2.3* 2.6* 2.6* 2.6*   No results for input(s): LIPASE, AMYLASE in the last 168 hours. Recent Labs  Lab 09/29/20 0110  AMMONIA 43*   Coagulation Profile: No results for input(s): INR, PROTIME in the last 168 hours. Cardiac Enzymes: No results for input(s): CKTOTAL, CKMB, CKMBINDEX, TROPONINI in the last 168 hours. BNP (last 3  results) No results for input(s): PROBNP in the last 8760 hours. HbA1C: No results for input(s): HGBA1C in the last 72 hours. CBG: Recent Labs  Lab 10/04/20 2039 10/04/20 2351 10/05/20 0510 10/05/20 0801 10/05/20 1206  GLUCAP 293* 225* 156* 148* 206*   Lipid Profile:  No results for input(s): CHOL, HDL, LDLCALC, TRIG, CHOLHDL, LDLDIRECT in the last 72 hours. Thyroid Function Tests: No results for input(s): TSH, T4TOTAL, FREET4, T3FREE, THYROIDAB in the last 72 hours. Anemia Panel: No results for input(s): VITAMINB12, FOLATE, FERRITIN, TIBC, IRON, RETICCTPCT in the last 72 hours. Urine analysis:    Component Value Date/Time   COLORURINE AMBER (A) 09/28/2020 1020   APPEARANCEUR CLOUDY (A) 09/28/2020 1020   LABSPEC 1.032 (H) 09/28/2020 1020   PHURINE 5.0 09/28/2020 1020   GLUCOSEU >=500 (A) 09/28/2020 1020   HGBUR MODERATE (A) 09/28/2020 1020   BILIRUBINUR NEGATIVE 09/28/2020 1020   KETONESUR 20 (A) 09/28/2020 1020   PROTEINUR 100 (A) 09/28/2020 1020   NITRITE NEGATIVE 09/28/2020 1020   LEUKOCYTESUR NEGATIVE 09/28/2020 1020   Sepsis Labs: _0 (procalcitonin:4,lacticidven:4)  ) Recent Results (from the past 240 hour(s))  Respiratory Panel by RT PCR (Flu A&B, Covid) - Nasopharyngeal Swab     Status: None   Collection Time: 09/26/20 11:11 AM   Specimen: Nasopharyngeal Swab; Nasopharyngeal(NP) swabs in vial transport medium  Result Value Ref Range Status   SARS Coronavirus 2 by RT PCR NEGATIVE NEGATIVE Final    Comment: (NOTE) SARS-CoV-2 target nucleic acids are NOT DETECTED.  The SARS-CoV-2 RNA is generally detectable in upper respiratoy specimens during the acute phase of infection. The lowest concentration of SARS-CoV-2 viral copies this assay can detect is 131 copies/mL. A negative result does not preclude SARS-Cov-2 infection and should not be used as the sole basis for treatment or other patient management decisions. A negative result may occur with  improper  specimen collection/handling, submission of specimen other than nasopharyngeal swab, presence of viral mutation(s) within the areas targeted by this assay, and inadequate number of viral copies (<131 copies/mL). A negative result must be combined with clinical observations, patient history, and epidemiological information. The expected result is Negative.  Fact Sheet for Patients:  PinkCheek.be  Fact Sheet for Healthcare Providers:  GravelBags.it  This test is no t yet approved or cleared by the [place] FDA and  has been authorized for detection and/or diagnosis of SARS-CoV-2 by FDA under an Emergency Use Authorization (EUA). This EUA will remain  in effect (meaning this test can be used) for the duration of the COVID-19 declaration under Section 564(b)(1) of the Act, 21 U.S.C. section 360bbb-3(b)(1), unless the authorization is terminated or revoked sooner.     Influenza A by PCR NEGATIVE NEGATIVE Final   Influenza B by PCR NEGATIVE NEGATIVE Final    Comment: (NOTE) The Xpert Xpress SARS-CoV-2/FLU/RSV assay is intended as an aid in  the diagnosis of influenza from Nasopharyngeal swab specimens and  should not be used as a sole basis for treatment. Nasal washings and  aspirates are unacceptable for Xpert Xpress SARS-CoV-2/FLU/RSV  testing.  Fact Sheet for Patients: PinkCheek.be  Fact Sheet for Healthcare Providers: GravelBags.it  This test is not yet approved or cleared by the [place] FDA and  has been authorized for detection and/or diagnosis of SARS-CoV-2 by  FDA under an Emergency Use Authorization (EUA). This EUA will remain  in effect (meaning this test can be used) for the duration of the  Covid-19 declaration under Section 564(b)(1) of the Act, 21  U.S.C. section 360bbb-3(b)(1), unless the authorization is  terminated or revoked. Performed at  [facility], [place] [address] [place], [place] [address]   Urine Culture     Status: Abnormal   Collection Time: 09/27/20 10:01 AM   Specimen: Urine, Clean  Catch  Result Value Ref Range Status   Specimen Description URINE, CLEAN CATCH  Final   Special Requests   Final    NONE Performed at [facility], [address] [place], [place] [address]    Culture MULTIPLE SPECIES PRESENT, SUGGEST RECOLLECTION (A)  Final   Report Status 09/28/2020 FINAL  Final  Culture, blood (Routine X 2) w Reflex to ID Panel     Status: None   Collection Time: 09/27/20 10:36 AM   Specimen: BLOOD  Result Value Ref Range Status   Specimen Description BLOOD RIGHT ANTECUBITAL  Final   Special Requests   Final    BOTTLES DRAWN AEROBIC AND ANAEROBIC Blood Culture adequate volume   Culture   Final    NO GROWTH 5 DAYS Performed at [facility], [place] [address], [place], [place] [address]    Report Status 10/02/2020 FINAL  Final  Culture, blood (Routine X 2) w Reflex to ID Panel     Status: None   Collection Time: 09/27/20 10:44 AM   Specimen: BLOOD RIGHT HAND  Result Value Ref Range Status   Specimen Description BLOOD RIGHT HAND  Final   Special Requests   Final    BOTTLES DRAWN AEROBIC AND ANAEROBIC Blood Culture adequate volume   Culture   Final    NO GROWTH 5 DAYS Performed at [facility], [place] [address] [place], [place] [address]    Report Status 10/02/2020 FINAL  Final  Culture, body fluid-bottle     Status: None   Collection Time: 09/30/20  7:32 PM   Specimen: Fluid  Result Value Ref Range Status   Specimen Description FLUID PLEURAL  Final   Special Requests BOTTLES DRAWN AEROBIC AND ANAEROBIC  Final   Culture   Final    NO GROWTH 5 DAYS Performed at [facility], [place] [address], [place], [place] [address]    Report Status 10/05/2020 FINAL  Final  Gram stain     Status: None   Collection Time: 09/30/20  7:32 PM   Specimen: Fluid  Result Value Ref Range Status     Specimen Description FLUID PLEURAL  Final   Special Requests NONE  Final   Gram Stain   Final    RARE WBC PRESENT,BOTH PMN AND MONONUCLEAR NO ORGANISMS SEEN Performed at [facility], [place] [address], [place], [place] [address]    Report Status 09/30/2020 FINAL  Final      Studies: DG CHEST PORT 1 VIEW  Result Date: 10/05/2020 CLINICAL DATA:  Status post extubation EXAM: PORTABLE CHEST 1 VIEW COMPARISON:  October 01, 2020 FINDINGS: Endotracheal tube and feeding tube have been removed. Chest tubes are unchanged in position on each side. Central catheter tip is in the superior vena cava. No pneumothorax. There is atelectatic change in the lung bases. Lungs otherwise are clear. Heart is slightly enlarged with pulmonary vascularity normal. No adenopathy. No bone lesions. IMPRESSION: Tube and catheter positions as described without pneumothorax. Bibasilar atelectasis. No edema or airspace opacity. Stable cardiac prominence. Electronically Signed   By: Lowella Grip III M.D.   On: 10/05/2020 08:09    Scheduled Meds: . amLODipine  5 mg Oral Daily  . chlorhexidine gluconate (MEDLINE KIT)  15 mL Mouth Rinse BID  . Chlorhexidine Gluconate Cloth  6 each Topical Daily  . clonazePAM  2 mg Oral TID  . enoxaparin (LOVENOX) injection  40 mg Subcutaneous Daily  . feeding supplement  1  Container Oral TID BM  . folic acid  1 mg Oral Daily  . insulin aspart  0-15 Units Subcutaneous Q4H  . mouth rinse  15 mL Mouth Rinse BID  . multivitamin with minerals  1 tablet Oral Daily  . pantoprazole (PROTONIX) IV  40 mg Intravenous Q12H  . PHENObarbital  30 mg Intravenous TID  . QUEtiapine  50 mg Oral BID  . sodium chloride flush  10 mL Intracatheter Q8H  . sodium chloride flush  10-40 mL Intracatheter Q12H  . thiamine  100 mg Oral Daily    Continuous Infusions:   LOS: 9 days     Kayleen Memos, MD [facility] Pager [phone]  If 7PM-7AM, please contact  night-coverage [email or web address] Password [facility] 10/05/2020, 1:37 PM

## 2020-10-06 ENCOUNTER — Inpatient Hospital Stay (HOSPITAL_COMMUNITY): Payer: Medicaid Other

## 2020-10-06 LAB — CBC WITH DIFFERENTIAL/PLATELET
Abs Immature Granulocytes: 0.28 10*3/uL — ABNORMAL HIGH (ref 0.00–0.07)
Basophils Absolute: 0.1 10*3/uL (ref 0.0–0.1)
Basophils Relative: 1 %
Eosinophils Absolute: 0.4 10*3/uL (ref 0.0–0.5)
Eosinophils Relative: 3 %
HCT: 31.5 % — ABNORMAL LOW (ref 39.0–52.0)
Hemoglobin: 10.6 g/dL — ABNORMAL LOW (ref 13.0–17.0)
Immature Granulocytes: 2 %
Lymphocytes Relative: 9 %
Lymphs Abs: 1.1 10*3/uL (ref 0.7–4.0)
MCH: 35.2 pg — ABNORMAL HIGH (ref 26.0–34.0)
MCHC: 33.7 g/dL (ref 30.0–36.0)
MCV: 104.7 fL — ABNORMAL HIGH (ref 80.0–100.0)
Monocytes Absolute: 1 10*3/uL (ref 0.1–1.0)
Monocytes Relative: 8 %
Neutro Abs: 9.3 10*3/uL — ABNORMAL HIGH (ref 1.7–7.7)
Neutrophils Relative %: 77 %
Platelets: 469 10*3/uL — ABNORMAL HIGH (ref 150–400)
RBC: 3.01 MIL/uL — ABNORMAL LOW (ref 4.22–5.81)
RDW: 12.5 % (ref 11.5–15.5)
WBC: 12.1 10*3/uL — ABNORMAL HIGH (ref 4.0–10.5)
nRBC: 0 % (ref 0.0–0.2)

## 2020-10-06 LAB — COMPREHENSIVE METABOLIC PANEL
ALT: 38 U/L (ref 0–44)
AST: 38 U/L (ref 15–41)
Albumin: 2.6 g/dL — ABNORMAL LOW (ref 3.5–5.0)
Alkaline Phosphatase: 75 U/L (ref 38–126)
Anion gap: 11 (ref 5–15)
BUN: <5 mg/dL — ABNORMAL LOW (ref 6–20)
CO2: 25 mmol/L (ref 22–32)
Calcium: 8.9 mg/dL (ref 8.9–10.3)
Chloride: 101 mmol/L (ref 98–111)
Creatinine, Ser: 0.92 mg/dL (ref 0.61–1.24)
GFR, Estimated: >60 mL/min (ref >60)
Glucose, Bld: 140 mg/dL — ABNORMAL HIGH (ref 70–99)
Potassium: 3.5 mmol/L (ref 3.5–5.1)
Sodium: 137 mmol/L (ref 135–145)
Total Bilirubin: 0.7 mg/dL (ref 0.3–1.2)
Total Protein: 6.1 g/dL — ABNORMAL LOW (ref 6.5–8.1)

## 2020-10-06 LAB — GLUCOSE, CAPILLARY
Glucose-Capillary: 107 mg/dL — ABNORMAL HIGH (ref 70–99)
Glucose-Capillary: 107 mg/dL — ABNORMAL HIGH (ref 70–99)
Glucose-Capillary: 122 mg/dL — ABNORMAL HIGH (ref 70–99)
Glucose-Capillary: 159 mg/dL — ABNORMAL HIGH (ref 70–99)
Glucose-Capillary: 187 mg/dL — ABNORMAL HIGH (ref 70–99)
Glucose-Capillary: 191 mg/dL — ABNORMAL HIGH (ref 70–99)
Glucose-Capillary: 214 mg/dL — ABNORMAL HIGH (ref 70–99)

## 2020-10-06 LAB — PROCALCITONIN: Procalcitonin: 0.43 ng/mL

## 2020-10-06 MED ORDER — POTASSIUM CHLORIDE CRYS ER 20 MEQ PO TBCR
40.0000 meq | EXTENDED_RELEASE_TABLET | Freq: Once | ORAL | Status: AC
  Administered 2020-10-06: 40 meq via ORAL
  Filled 2020-10-06: qty 2

## 2020-10-06 MED ORDER — CLONAZEPAM 0.5 MG PO TABS
2.0000 mg | ORAL_TABLET | Freq: Two times a day (BID) | ORAL | Status: DC
  Administered 2020-10-06 – 2020-10-07 (×3): 2 mg via ORAL
  Filled 2020-10-06 (×3): qty 4

## 2020-10-06 MED ORDER — MAGNESIUM OXIDE 400 (241.3 MG) MG PO TABS
800.0000 mg | ORAL_TABLET | Freq: Once | ORAL | Status: AC
  Administered 2020-10-06: 800 mg via ORAL
  Filled 2020-10-06: qty 2

## 2020-10-06 NOTE — Progress Notes (Signed)
Physical Therapy Treatment Patient Details Name: Carlos [name] MRN: [id number] DOB: 14-Feb-1986 Today's Date: 10/06/2020    History of Present Illness 34 yo admitted 11/19 with LUQ pain and emesis with necrotizing pancreatitis from ETOH use. 11/21 pt with worsening tachycardia and tachypnea with new AKI and transfer to ICU. Intubated and bronchoscopy with chest tube 11/23, extubated 11/25. PMhx: tobacco and ETOH use    PT Comments    Bilateral chest tubes removed prior to ambulation and pt given permission to ambulate. Pt  Able to ambulate 200' with RW and min-guard A working on increasing stride length as well as maintaining erect posture and fwd gaze. Pt's SpO2 remained in high 90's, HR as high as 131 bpm. Pt able to engage in conversation while ambulating. Change follow up recs to outpt PT. PT will continue to follow.   Follow Up Recommendations  Supervision/Assistance - 24 hour;Outpatient PT     Equipment Recommendations  Rolling walker with 5" wheels    Recommendations for Other Services OT consult     Precautions / Restrictions Precautions Precautions: Fall Precaution Comments: watch HR Restrictions Weight Bearing Restrictions: No    Mobility  Bed Mobility Overal bed mobility: Modified Independent Bed Mobility: Supine to Sit;Sit to Supine     Supine to sit: HOB elevated;Modified independent (Device/Increase time) Sit to supine: Modified independent (Device/Increase time)   General bed mobility comments: pt's chest tubes removed before session and MD permitted pt to ambulate. No assist needed to come to EOB or return to supine  Transfers Overall transfer level: Needs assistance Equipment used: None Transfers: Sit to/from Stand Sit to Stand: Supervision         General transfer comment: supervision for safety, no use of RW for standing  Ambulation/Gait Ambulation/Gait assistance: Min guard Gait Distance (Feet): 200 Feet Assistive device: Rolling walker (2  wheeled) Gait Pattern/deviations: Step-through pattern;Decreased stride length;Trunk flexed Gait velocity: decreased Gait velocity interpretation: <1.8 ft/sec, indicate of risk for recurrent falls General Gait Details: cues for posture and fwd gaze, needed twice and then self corrected before 3rd cue needed. Discussed difference between normal pace and hus current pace and he reports fatigue and time in bed are currently limiting him. SpO2 remained in 90's on RA, HR as high as 131 bpm   Stairs             Wheelchair Mobility    Modified Rankin (Stroke Patients Only)       Balance Overall balance assessment: Needs assistance Sitting-balance support: No upper extremity supported;Feet supported Sitting balance-Leahy Scale: Good     Standing balance support: Bilateral upper extremity supported;During functional activity Standing balance-Leahy Scale: Fair Standing balance comment: able to maintain static standing without UE support                            Cognition Arousal/Alertness: Awake/alert Behavior During Therapy: Flat affect   Area of Impairment: Memory;Safety/judgement;Awareness;Problem solving                   Current Attention Level: Selective Memory: Decreased short-term memory Following Commands: Follows one step commands consistently;Follows multi-step commands with increased time Safety/Judgement: Decreased awareness of safety;Decreased awareness of deficits Awareness: Emergent Problem Solving: Slow processing;Requires verbal cues;Requires tactile cues General Comments: pt continues to process slowly but now following multi step commands and able to engage in conversation about personal health and changes that he will make to stay away from EtOH. Had some difficulty  recalling his 4 kids' ages      Exercises      General Comments        Pertinent Vitals/Pain Pain Assessment: No/denies pain    Home Living                       Prior Function            PT Goals (current goals can now be found in the care plan section) Acute Rehab PT Goals Patient Stated Goal: return home PT Goal Formulation: With patient/family Time For Goal Achievement: 10/14/20 Potential to Achieve Goals: Good Progress towards PT goals: Progressing toward goals    Frequency    Min 3X/week      PT Plan Discharge plan needs to be updated    Co-evaluation              AM-PAC PT "6 Clicks" Mobility   Outcome Measure  Help needed turning from your back to your side while in a flat bed without using bedrails?: None Help needed moving from lying on your back to sitting on the side of a flat bed without using bedrails?: None Help needed moving to and from a bed to a chair (including a wheelchair)?: A Little Help needed standing up from a chair using your arms (e.g., wheelchair or bedside chair)?: A Little Help needed to walk in hospital room?: A Little Help needed climbing 3-5 steps with a railing? : A Little 6 Click Score: 20    End of Session Equipment Utilized During Treatment: Gait belt Activity Tolerance: Patient tolerated treatment well Patient left: with call bell/phone within reach;in bed;Other (comment) (xray present) Nurse Communication: Mobility status PT Visit Diagnosis: Other abnormalities of gait and mobility (R26.89);Difficulty in walking, not elsewhere classified (R26.2)     Time: 0737-1062 PT Time Calculation (min) (ACUTE ONLY): 20 min  Charges:  $Gait Training: 8-22 mins                     Lyanne Co, PT  Acute Rehab Services  Pager [phone] Office [phone]    Lawana Chambers Domanique Luckett 10/06/2020, 12:17 PM

## 2020-10-06 NOTE — Progress Notes (Signed)
NAME:  Equan Cogbill, MRN:  [id number], DOB:  [birth year], LOS: 10 ADMISSION DATE:  09/26/2020, CONSULTATION DATE:  09/28/2020 REFERRING MD:  Dr. Allena Katz, CHIEF COMPLAINT:  Pancreatitis w/necrosis   Brief History   34 year old admitted with acute pancreatitis, presumably due to ETOH abuse.  Developed clinical worsening on 11/21 with high CIWA scores, persistent tachycardia, tachypnea, new AKI, elevating t. Bili, and repeat CT showing new changes consistent with necrotizing pancreatitis.  GI and [facility]CS following.   Past Medical History  Tobacco abuse, ETOH use (reported 8+ beers and liquor/ daily) Otherwise no pertinent past medical history   Significant Hospital Events   11/19 Admitted [facility] 11/21 PCCM consulted, tx ICU 11/23 intubation, bronchoscopy, left pigtail placement 11/29 Bilateral chest tube removal  Consults:  GI- [facility] PCCM  Procedures:   Significant Diagnostic Tests:  11/19 CT abd/ pelvis >> 1. Severe interstitial edematous pancreatitis with early acute pancreatic collections. 2. Diminished enhancement throughout the gland not definitive at this time for signs of necrosis, perhaps due to severe pancreatic edema. Consider close follow-up based on clinical parameters to exclude the possibility of developing glandular necrosis. 3. Signs of ascites. 4. Small hiatal hernia and mild circumferential distal esophageal thickening.  Correlate with any clinical signs of esophagitis. 5. Probable hepatic steatosis.   11/19 limited RUQ [place] >> 1. Trace free fluid.  Hepatic steatosis. 2. Otherwise negative right upper quadrant   11/21 TTE >> LVEF 60-65%, no wall abnormalities, mild LVH, normal RV   11/21 CT abd w/contrast >> 1. Redemonstrated extensive inflammatory stranding about the pancreas and adjacent structures, with an interval increase in retroperitoneal inflammatory fluid. 2. There is new hypoenhancement of the majority of the pancreatic body and tail, involving approximately  the distal 9.2 cm of the pancreas. 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 3. There is a new fluid collection of the anterior pancreatic head measuring 3.2 x 2.9 cm, consistent with developing pseudocyst. 4. New small volume ascites. 5. New moderate bilateral pleural effusions and associated atelectasis or consolidation.  Micro Data:  11/19 SARS 2/ Flu >> neg 11/20 BCx 2 >> Neg 11/20 UC >> multiple species   Antimicrobials:  Zosyn 11/20 >> 11/25   Interim history/subjective:  Chest tubes on waterseal this am. Combined chest tube output ~60 in last 24 hours. Denies discomfort. Working with PT to ambulate.  Objective   Blood pressure 112/69, pulse 94, temperature 98.3 F (36.8 C), temperature source Oral, resp. rate 17, height 6\' 5"  (1.956 m), weight 79.3 kg, SpO2 96 %.        Intake/Output Summary (Last 24 hours) at 10/06/2020 1414 Last data filed at 10/06/2020 0444 Gross per 24 hour  Intake 20 ml  Output 535 ml  Net -515 ml   Filed Weights   10/04/20 0500 10/05/20 0527 10/06/20 0527  Weight: 82.7 kg 81.1 kg 79.3 kg   Physical Exam: General: Well-appearing, no acute distress HENT: [place], AT, OP clear, MMM Eyes: EOMI, no scleral icterus Respiratory: Bilateral air entry with diminished bases. No crackles, wheezing or rales Cardiovascular: RRR, -M/R/G, no JVD Extremities:-Edema,-tenderness Neuro: AAO x4, CNII-XII grossly intact Skin: Intact, no rashes or bruising Psych: Normal mood, normal affect  Resolved Hospital Problem list   Acute kidney injury improved with IVF - Daily monitoring  Elevated WBC, Pct- presumed infection, some question of cholangitis but bili argues against, exudative effusion and thick mucus endobronchially, question aspiration - Zosyn x 5 days reasonable, end date today  Assessment & Plan:  Acute hypoxemic respiratory failure secondary to atelectasis and third spacing, effusions, resolved. Currently on room air. --Bilateral chest  tubes removed. Post-CXR without PTX, mild right basilar atelectasis. Will repeat CXR tomorrow am --Continue IS, flutter and OOB --Continue PT  Acute metabolic encephalopathy secondary to alcohol withdrawal- resolved. S/p phenobarb taper.  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  --Per primary team. Continue close electrolyte monitoring and repletion  Best practice:  Diet: per above Pain/Anxiety/Delirium protocol (if indicated): see above VAP protocol (if indicated): in place DVT prophylaxis: lovenox GI prophylaxis: PPI BID Glucose control: see above Mobility: BR Code Status: Full Family Communication: Per primary team Disposition: Progressive care unit  Care Time: 40 min   Mechele Collin, M.D. [facility] Pulmonary/Critical Care Medicine 10/06/2020 2:14 PM

## 2020-10-06 NOTE — Progress Notes (Signed)
Occupational Therapy Treatment Patient Details Name: Carlos [name] MRN: [id number] DOB: 1986-09-14 Today's Date: 10/06/2020    History of present illness 34 yo admitted 11/19 with LUQ pain and emesis with necrotizing pancreatitis from ETOH use. 11/21 pt with worsening tachycardia and tachypnea with new AKI and transfer to ICU. Intubated and bronchoscopy with chest tube 11/23, extubated 11/25. PMhx: tobacco and ETOH use   OT comments  Patient continues to make slow progress towards goals in skilled OT session. Patient's session encompassed functional ambulation in room due to significant lethargy, flat affect, and bilateral chest tubes. Pt noted to nod off x4 as therapist was prepping room and equipment for ambulation, and nodding off x1 sitting EOB. Pt able to remain awake and alert to ambulate to door and back with significant guarded pace, and HR maxing out at 140 (pt reports feeling a little adrenaline like feeling, but could not feel heart race or other symptoms). Pt returned to bed due to increased lethargy, therapy will continue to follow.    Follow Up Recommendations  Home health OT;Supervision/Assistance - 24 hour    Equipment Recommendations  3 in 1 bedside commode    Recommendations for Other Services      Precautions / Restrictions Precautions Precautions: Fall Precaution Comments: watch HR Restrictions Weight Bearing Restrictions: No       Mobility Bed Mobility Overal bed mobility: Modified Independent Bed Mobility: Supine to Sit;Sit to Supine     Supine to sit: HOB elevated;Modified independent (Device/Increase time) Sit to supine: Modified independent (Device/Increase time)   General bed mobility comments: pt's chest tubes removed before session and MD permitted pt to ambulate. No assist needed to come to EOB or return to supine  Transfers Overall transfer level: Needs assistance Equipment used: Rolling walker (2 wheeled) Transfers: Sit to/from Stand Sit to Stand:  Supervision         General transfer comment: RW used due to significant lethargy in session and slow pace walking    Balance Overall balance assessment: Needs assistance Sitting-balance support: No upper extremity supported;Feet supported Sitting balance-Leahy Scale: Good     Standing balance support: Bilateral upper extremity supported;During functional activity Standing balance-Leahy Scale: Poor Standing balance comment: reliant on UE support                           ADL either performed or assessed with clinical judgement   ADL Overall ADL's : Needs assistance/impaired                         Toilet Transfer: Minimal assistance;RW Toilet Transfer Details (indicate cue type and reason): simulated in room         Functional mobility during ADLs: Minimal assistance;Rolling walker;Cueing for safety General ADL Comments: pt limited by lethargy, decreased activity tolerance, weakness and cognition     Vision       Perception     Praxis      Cognition Arousal/Alertness: Lethargic Behavior During Therapy: Flat affect Overall Cognitive Status: Impaired/Different from baseline Area of Impairment: Memory;Safety/judgement;Awareness;Problem solving                   Current Attention Level: Selective Memory: Decreased short-term memory Following Commands: Follows one step commands consistently;Follows multi-step commands with increased time Safety/Judgement: Decreased awareness of safety;Decreased awareness of deficits Awareness: Emergent Problem Solving: Slow processing;Requires verbal cues;Requires tactile cues General Comments: pt continues to process slowly but now following  multi step commands and able to engage in conversation about personal health and changes that he will make to stay away from EtOH. Had some difficulty recalling his 4 kids' ages        Exercises     Shoulder Instructions       General Comments       Pertinent Vitals/ Pain       Pain Assessment: No/denies pain  Home Living                                          Prior Functioning/Environment              Frequency  Min 2X/week        Progress Toward Goals  OT Goals(current goals can now be found in the care plan section)  Progress towards OT goals: Progressing toward goals  Acute Rehab OT Goals Patient Stated Goal: return home OT Goal Formulation: With patient Time For Goal Achievement: 10/18/20 Potential to Achieve Goals: Good  Plan Discharge plan remains appropriate    Co-evaluation                 AM-PAC OT "6 Clicks" Daily Activity     Outcome Measure   Help from another person eating meals?: A Little Help from another person taking care of personal grooming?: A Little Help from another person toileting, which includes using toliet, bedpan, or urinal?: A Little Help from another person bathing (including washing, rinsing, drying)?: A Little Help from another person to put on and taking off regular upper body clothing?: A Little Help from another person to put on and taking off regular lower body clothing?: A Little 6 Click Score: 18    End of Session Equipment Utilized During Treatment: Rolling walker  OT Visit Diagnosis: Other abnormalities of gait and mobility (R26.89);Muscle weakness (generalized) (M62.81);Other symptoms and signs involving cognitive function   Activity Tolerance Patient tolerated treatment well   Patient Left with call bell/phone within reach;in bed   Nurse Communication Mobility status;Other (comment) (HR and lethargy)        Time: 6962-9528 OT Time Calculation (min): 17 min  Charges: OT General Charges $OT Visit: 1 Visit OT Treatments $Self Care/Home Management : 8-22 mins  Pollyann Glen E. Randol Zumstein, COTA/L Acute Rehabilitation Services [phone]   Cherlyn Cushing 10/06/2020, 1:52 PM

## 2020-10-06 NOTE — Progress Notes (Signed)
PROGRESS NOTE  Carlos [name] WRU:[id number] DOB: 1986-01-10 DOA: 09/26/2020 PCP: Lauree Chandler, NP  HPI/Recap of past 76 hours: 34 year old male with no significant past medical history other than tobacco abuse and daily ETOH use.  Wife reports chronic history of ETOH, however very heavy over the last year; last drink on 11/18.  He presented on 11/19 with LUQ pain and non-bloody emesis.  Workup notable for lipase 2503, AST 150, ALT 108, t.bili 1. CT abd/ pelvis showing severe interstitial edematous pancreatitis with early acute pancreatic collections.  Admitted to [facility] with acute pancreatitis.  GI following.  On 11/21, he developed clinical worsening, with high CIWA scores > 20 on ativan, persistent tachycardia, tachypnea, new AKI, elevating t. Bili, and repeat CT showing new changes consistent with necrotizing pancreatitis.  PCCM consulted for ICU transfer and further management of aggressive resuscitation.    10/06/20: Seen and examined with his girlfriend at his bedside.  He is tolerating a soft diet.  Denies any abdominal pain or nausea.  Had 1 bowel movement today.  Advised to remain on soft low fat diet for the next 4 weeks as recommended by GI.  Patient understands and agrees to plan.    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 Seen by GI, signed off on 10/05/2020. Advised to remain on soft low fat diet for the next 4 weeks as recommended by GI.    Resolved hypoxic respiratory failure, bilateral chest tubes in place on  suction. Currently on room air with O2 saturation 98% on room air Chest tube managed by pulmonary, appreciate assistance Independently chest x-ray done on 10/05/2020 which shows no evidence of atelectasis or pneumothorax Plan to remove bilateral chest tubes on 10/06/2020  Alcohol use disorder No evidence of alcohol withdrawal at time of this visit Continue CIWA protocol Is currently off phenobarbital, received 9 doses. Weaning off Klonopin as tolerated.  Essential hypertension BP is at goal Continue current management, Norvasc 5 mg daily Continue to monitor vital signs  GERD Stable Continue PPI  Code Status: Full code  Family Communication: Updated his girlfriend at bedside on 10/06/2020  Disposition Plan: Likely will discharge to home when pulmonary signs off, possibly 10/07/2020 or 10/08/2020.  Consultants:  GI, signed off on 10/05/2020  Pulmonary  Procedures:  Chest tube placement  Antimicrobials:  None.  DVT prophylaxis: Subcu Lovenox daily.  Status is: Inpatient  Dispo: The patient is from: Home.              Anticipated d/c is to: Home.              Anticipated d/c date is: 10/07/2020              Patient currently not stable for discharge due to ongoing management of chest tubes.       Objective: Vitals:   10/06/20 0359 10/06/20 0527 10/06/20 0731 10/06/20 1045  BP: 124/81  122/89 112/69  Pulse: 79  82 94  Resp: _0 Temp: 98.4 F (36.9 C)  (!) 97.4 F (36.3 C) 98.3 F (  36.8 C)  TempSrc: Oral  Oral Oral  SpO2: 97%  99% 96%  Weight:  79.3 kg    Height:  _0  (1.956 m)      Intake/Output Summary (Last 24 hours) at 10/06/2020 1351 Last data filed at 10/06/2020 0444 Gross per 24 hour  Intake 20 ml  Output 535 ml  Net -515 ml   Filed Weights   10/04/20 0500 10/05/20 0527 10/06/20 0527  Weight: 82.7 kg 81.1 kg 79.3 kg    Exam:  . General: 34 y.o. year-old male well-developed well-nourished no acute distress.  Alert oriented  x3.   . Cardiovascular: Regular rate and rhythm no rubs or gallops.  Respiratory: Clear to auscultation no wheezes or rales.  Abdomen: Soft nontender normal bowel sounds present.  Musculoskeletal: No lower extremity edema bilaterally.  Psychiatry: Mood is appropriate for condition and setting.   Data Reviewed: CBC: Recent Labs  Lab 10/02/20 0558 10/03/20 0605 10/04/20 0205 10/05/20 0518 10/06/20 0536  WBC 8.4 13.6* 12.5* 11.4* 12.1*  NEUTROABS  --   --   --  8.5* 9.3*  HGB 10.1* 10.3* 11.5* 10.8* 10.6*  HCT 29.3* 30.4* 33.8* 31.7* 31.5*  MCV 104.6* 107.0* 106.0* 104.3* 104.7*  PLT 243 304 360 449* 599*   Basic Metabolic Panel: Recent Labs  Lab 10/01/20 1629 10/01/20 1629 10/01/20 2027 10/01/20 2027 10/02/20 0558 10/02/20 0558 10/02/20 1813 10/03/20 0605 10/04/20 0205 10/05/20 0518 10/06/20 0536  NA 140   < > 132*   < > 141   < > 140 137 136 136 137  K 3.3*   < > 3.0*   < > 3.7   < > 3.7 3.8 3.8 3.8 3.5  CL 102   < > 98   < > 106   < > 102 99 99 100 101  CO2 27   < > 23   < > 25   < > _1 GLUCOSE 198*   < > 209*   < > 248*   < > 214* 208* 188* 182* 140*  BUN 7   < > 6   < > 6   < > 6 5* 6 5* <5*  CREATININE 1.05   < > 0.84   < > 1.03   < > 1.05 0.99 1.03 1.00 0.92  CALCIUM 8.0*   < > 7.1*   < > 7.9*   < > 8.4* 8.5* 8.6* 8.8* 8.9  MG 2.0  --   --   --  2.1  --  2.1 1.9 1.9  --   --   PHOS  --   --  1.8*  --  3.3  --  4.3 4.2 3.6  --   --    < > = values in this interval not displayed.   GFR: Estimated Creatinine Clearance: 126.9 mL/min (by C-G formula based on SCr of 0.92 mg/dL). Liver Function Tests: Recent Labs  Lab 10/02/20 0558 10/03/20 0605 10/04/20 0205 10/05/20 0518 10/06/20 0536  AST _2 32 38  ALT _3 38  ALKPHOS 92 118 98 85 75  BILITOT 1.6* 2.0* 1.4* 1.0 0.7  PROT 5.0* 6.0* 6.3* 6.3* 6.1*  ALBUMIN 2.3* 2.6* 2.6* 2.6* 2.6*   No results for input(s): LIPASE, AMYLASE in the last 168 hours. No results for input(s): AMMONIA  in the last 168 hours. Coagulation Profile: No results for input(s): INR, PROTIME in the last 168 hours. Cardiac Enzymes: No  results for input(s): CKTOTAL, CKMB, CKMBINDEX, TROPONINI in the last 168 hours. BNP (last 3 results) No results for input(s): PROBNP in the last 8760 hours. HbA1C: No results for input(s): HGBA1C in the last 72 hours. CBG: Recent Labs  Lab 10/05/20 2014 10/06/20 0011 10/06/20 0358 10/06/20 0732 10/06/20 1047  GLUCAP 264* 107* 159* 122* 191*   Lipid Profile: No results for input(s): CHOL, HDL, LDLCALC, TRIG, CHOLHDL, LDLDIRECT in the last 72 hours. Thyroid Function Tests: No results for input(s): TSH, T4TOTAL, FREET4, T3FREE, THYROIDAB in the last 72 hours. Anemia Panel: No results for input(s): VITAMINB12, FOLATE, FERRITIN, TIBC, IRON, RETICCTPCT in the last 72 hours. Urine analysis:    Component Value Date/Time   COLORURINE AMBER (A) 09/28/2020 1020   APPEARANCEUR CLOUDY (A) 09/28/2020 1020   LABSPEC 1.032 (H) 09/28/2020 1020   PHURINE 5.0 09/28/2020 1020   GLUCOSEU >=500 (A) 09/28/2020 1020   HGBUR MODERATE (A) 09/28/2020 1020   BILIRUBINUR NEGATIVE 09/28/2020 1020   KETONESUR 20 (A) 09/28/2020 1020   PROTEINUR 100 (A) 09/28/2020 1020   NITRITE NEGATIVE 09/28/2020 1020   LEUKOCYTESUR NEGATIVE 09/28/2020 1020   Sepsis Labs: _0 (procalcitonin:4,lacticidven:4)  ) Recent Results (from the past 240 hour(s))  Urine Culture     Status: Abnormal   Collection Time: 09/27/20 10:01 AM   Specimen: Urine, Clean Catch  Result Value Ref Range Status   Specimen Description URINE, CLEAN CATCH  Final   Special Requests   Final    NONE Performed at [facility], [place] [address], [place], [place] [address]    Culture MULTIPLE SPECIES PRESENT, SUGGEST RECOLLECTION (A)  Final   Report Status 09/28/2020 FINAL  Final  Culture, blood (Routine X 2) w Reflex to ID Panel     Status: None   Collection Time: 09/27/20 10:36 AM   Specimen: BLOOD    Result Value Ref Range Status   Specimen Description BLOOD RIGHT ANTECUBITAL  Final   Special Requests   Final    BOTTLES DRAWN AEROBIC AND ANAEROBIC Blood Culture adequate volume   Culture   Final    NO GROWTH 5 DAYS Performed at [facility], [place] [address] [place], [place] [address]    Report Status 10/02/2020 FINAL  Final  Culture, blood (Routine X 2) w Reflex to ID Panel     Status: None   Collection Time: 09/27/20 10:44 AM   Specimen: BLOOD RIGHT HAND  Result Value Ref Range Status   Specimen Description BLOOD RIGHT HAND  Final   Special Requests   Final    BOTTLES DRAWN AEROBIC AND ANAEROBIC Blood Culture adequate volume   Culture   Final    NO GROWTH 5 DAYS Performed at [facility], [place] [address] [place], [place] [address]    Report Status 10/02/2020 FINAL  Final  Culture, body fluid-bottle     Status: None   Collection Time: 09/30/20  7:32 PM   Specimen: Fluid  Result Value Ref Range Status   Specimen Description FLUID PLEURAL  Final   Special Requests BOTTLES DRAWN AEROBIC AND ANAEROBIC  Final   Culture   Final    NO GROWTH 5 DAYS Performed at [facility], [place] [address], [place], [place] [address]    Report Status 10/05/2020 FINAL  Final  Gram stain     Status: None   Collection Time: 09/30/20  7:32 PM   Specimen: Fluid  Result Value Ref Range Status   Specimen Description FLUID  PLEURAL  Final   Special Requests NONE  Final   Gram Stain   Final    RARE WBC PRESENT,BOTH PMN AND MONONUCLEAR NO ORGANISMS SEEN Performed at [facility], [place] [address] [place], [place] [address]    Report Status 09/30/2020 FINAL  Final      Studies: DG CHEST PORT 1 VIEW  Result Date: 10/06/2020 CLINICAL DATA:  Pleural effusions. EXAM: PORTABLE CHEST 1 VIEW COMPARISON:  October 05, 2020. FINDINGS: Stable cardiomediastinal silhouette. No pneumothorax is noted. Bilateral chest tubes have been removed without pneumothorax. Left lung is clear. Mild  right basilar subsegmental atelectasis is noted with probable small right pleural effusion. Right-sided PICC line is unchanged in position. Bony thorax is unremarkable. IMPRESSION: Bilateral chest tubes have been removed without pneumothorax. Mild right basilar subsegmental atelectasis is noted with probable small right pleural effusion. Electronically Signed   By: Marijo Conception M.D.   On: 10/06/2020 11:36    Scheduled Meds: . amLODipine  5 mg Oral Daily  . chlorhexidine gluconate (MEDLINE KIT)  15 mL Mouth Rinse BID  . Chlorhexidine Gluconate Cloth  6 each Topical Daily  . clonazePAM  2 mg Oral BID  . enoxaparin (LOVENOX) injection  40 mg Subcutaneous Daily  . feeding supplement  1 Container Oral TID BM  . folic acid  1 mg Oral Daily  . insulin aspart  0-15 Units Subcutaneous Q4H  . insulin glargine  5 Units Subcutaneous Daily  . mouth rinse  15 mL Mouth Rinse BID  . multivitamin with minerals  1 tablet Oral Daily  . pantoprazole (PROTONIX) IV  40 mg Intravenous Q12H  . QUEtiapine  50 mg Oral BID  . sodium chloride flush  10 mL Intracatheter Q8H  . sodium chloride flush  10-40 mL Intracatheter Q12H  . thiamine  100 mg Oral Daily    Continuous Infusions:   LOS: 10 days     Kayleen Memos, MD [facility] Pager [phone]  If 7PM-7AM, please contact night-coverage [email or web address] Password [email or web address] 10/06/2020, 1:51 PM

## 2020-10-07 ENCOUNTER — Inpatient Hospital Stay (HOSPITAL_COMMUNITY): Payer: Medicaid Other

## 2020-10-07 DIAGNOSIS — J9601 Acute respiratory failure with hypoxia: Secondary | ICD-10-CM

## 2020-10-07 DIAGNOSIS — J90 Pleural effusion, not elsewhere classified: Secondary | ICD-10-CM

## 2020-10-07 LAB — CBC WITH DIFFERENTIAL/PLATELET
Abs Immature Granulocytes: 0.24 10*3/uL — ABNORMAL HIGH (ref 0.00–0.07)
Basophils Absolute: 0.1 10*3/uL (ref 0.0–0.1)
Basophils Relative: 1 %
Eosinophils Absolute: 0.4 10*3/uL (ref 0.0–0.5)
Eosinophils Relative: 3 %
HCT: 31.1 % — ABNORMAL LOW (ref 39.0–52.0)
Hemoglobin: 10.3 g/dL — ABNORMAL LOW (ref 13.0–17.0)
Immature Granulocytes: 2 %
Lymphocytes Relative: 8 %
Lymphs Abs: 1 10*3/uL (ref 0.7–4.0)
MCH: 35.2 pg — ABNORMAL HIGH (ref 26.0–34.0)
MCHC: 33.1 g/dL (ref 30.0–36.0)
MCV: 106.1 fL — ABNORMAL HIGH (ref 80.0–100.0)
Monocytes Absolute: 0.9 10*3/uL (ref 0.1–1.0)
Monocytes Relative: 7 %
Neutro Abs: 10 10*3/uL — ABNORMAL HIGH (ref 1.7–7.7)
Neutrophils Relative %: 79 %
Platelets: 481 10*3/uL — ABNORMAL HIGH (ref 150–400)
RBC: 2.93 MIL/uL — ABNORMAL LOW (ref 4.22–5.81)
RDW: 12.5 % (ref 11.5–15.5)
WBC: 12.7 10*3/uL — ABNORMAL HIGH (ref 4.0–10.5)
nRBC: 0 % (ref 0.0–0.2)

## 2020-10-07 LAB — GLUCOSE, CAPILLARY
Glucose-Capillary: 140 mg/dL — ABNORMAL HIGH (ref 70–99)
Glucose-Capillary: 143 mg/dL — ABNORMAL HIGH (ref 70–99)
Glucose-Capillary: 160 mg/dL — ABNORMAL HIGH (ref 70–99)

## 2020-10-07 LAB — BASIC METABOLIC PANEL
Anion gap: 7 (ref 5–15)
BUN: <5 mg/dL — ABNORMAL LOW (ref 6–20)
CO2: 23 mmol/L (ref 22–32)
Calcium: 8.7 mg/dL — ABNORMAL LOW (ref 8.9–10.3)
Chloride: 101 mmol/L (ref 98–111)
Creatinine, Ser: 0.95 mg/dL (ref 0.61–1.24)
GFR, Estimated: >60 mL/min (ref >60)
Glucose, Bld: 147 mg/dL — ABNORMAL HIGH (ref 70–99)
Potassium: 3.7 mmol/L (ref 3.5–5.1)
Sodium: 131 mmol/L — ABNORMAL LOW (ref 135–145)

## 2020-10-07 LAB — MAGNESIUM: Magnesium: 1.9 mg/dL (ref 1.7–2.4)

## 2020-10-07 MED ORDER — CLONAZEPAM 0.5 MG PO TABS
0.5000 mg | ORAL_TABLET | Freq: Two times a day (BID) | ORAL | 0 refills | Status: DC | PRN
Start: 2020-10-07 — End: 2021-08-14

## 2020-10-07 MED ORDER — PANTOPRAZOLE SODIUM 40 MG PO TBEC: 40.0000 mg | DELAYED_RELEASE_TABLET | Freq: Two times a day (BID) | ORAL | Status: DC

## 2020-10-07 MED ORDER — AMLODIPINE BESYLATE 5 MG PO TABS: 5.0000 mg | ORAL_TABLET | Freq: Every day | ORAL | 0 refills | Status: DC

## 2020-10-07 MED ORDER — PANTOPRAZOLE SODIUM 40 MG PO TBEC: 40.0000 mg | DELAYED_RELEASE_TABLET | Freq: Two times a day (BID) | ORAL | 0 refills | Status: DC

## 2020-10-07 MED ORDER — QUETIAPINE FUMARATE 50 MG PO TABS: 50.0000 mg | ORAL_TABLET | Freq: Two times a day (BID) | ORAL | 0 refills | Status: DC

## 2020-10-07 MED ORDER — CLONAZEPAM 0.5 MG PO TABS: 0.2500 mg | ORAL_TABLET | Freq: Two times a day (BID) | ORAL | Status: DC | PRN

## 2020-10-07 MED ORDER — METFORMIN HCL 500 MG PO TABS: 500.0000 mg | ORAL_TABLET | Freq: Two times a day (BID) | ORAL | 3 refills | Status: DC

## 2020-10-07 MED ORDER — THIAMINE HCL 100 MG PO TABS: 100.0000 mg | ORAL_TABLET | Freq: Every day | ORAL | 0 refills | Status: AC

## 2020-10-07 MED ORDER — FOLIC ACID 1 MG PO TABS: 1.0000 mg | ORAL_TABLET | Freq: Every day | ORAL | 0 refills | Status: AC

## 2020-10-07 MED ORDER — CLONAZEPAM 0.5 MG PO TABS: 0.5000 mg | ORAL_TABLET | Freq: Two times a day (BID) | ORAL | Status: DC | PRN

## 2020-10-07 NOTE — Progress Notes (Signed)
Physical Therapy Treatment Patient Details Name: Carlos [name] MRN: [id number] DOB: October 15, 1986 Today's Date: 10/07/2020    History of Present Illness 34 yo admitted 11/19 with LUQ pain and emesis with necrotizing pancreatitis from ETOH use. 11/21 pt with worsening tachycardia and tachypnea with new AKI and transfer to ICU. Intubated and bronchoscopy with chest tube 11/23, extubated 11/25. PMhx: tobacco and ETOH use    PT Comments    Pt sitting edge of bed on arrival.  He continues to present with cognitive deficits.  On arrival he had removed cardiac tele and pulse ox.  RN entered room and he had also forgotten to take his morning meds.  Pt required cues for safety with obstacle negotiation.  Minor drift noted and weakness in B LEs.  Pt did perform flight of stair training and HR elevated to 140 bpm.  Plan remains appropriate for outpt PT.   Follow Up Recommendations  Supervision/Assistance - 24 hour;Outpatient PT     Equipment Recommendations  Rolling walker with 5" wheels    Recommendations for Other Services       Precautions / Restrictions Precautions Precautions: Fall Precaution Comments: watch HR Restrictions Weight Bearing Restrictions: No    Mobility  Bed Mobility Overal bed mobility: Modified Independent                Transfers Overall transfer level: Modified independent Equipment used: Rolling walker (2 wheeled)                Ambulation/Gait Ambulation/Gait assistance: Supervision Gait Distance (Feet): 250 Feet Assistive device: Rolling walker (2 wheeled) Gait Pattern/deviations: Step-through pattern;Decreased stride length Gait velocity: decreased   General Gait Details: pt holding posture better but due to keeping head up at all times he was noted to have poor obstacle negotiation.  Educated on scanning environment to avoid obstacles in pathway.  Pt HR elevated to 140 bpm after stair training and he required cues for rest break in  standing.   Stairs Stairs: Yes Stairs assistance: Supervision Stair Management: One rail Left;Forwards Number of Stairs: 10 General stair comments: Cues for safety with heavy reliance on rail for balance.  Cues for foot clearance on stairs.   Wheelchair Mobility    Modified Rankin (Stroke Patients Only)       Balance Overall balance assessment: Needs assistance Sitting-balance support: No upper extremity supported;Feet supported Sitting balance-Leahy Scale: Good Sitting balance - Comments: dynamically with supervision      Standing balance-Leahy Scale: Poor Standing balance comment: reliant on UE support                            Cognition Arousal/Alertness: Awake/alert Behavior During Therapy: Flat affect Overall Cognitive Status: Impaired/Different from baseline Area of Impairment: Problem solving                         Safety/Judgement: Decreased awareness of safety (patient ran into obstacles on the L x 3.)   Problem Solving: Requires tactile cues;Slow processing General Comments: Pt remains slow to process but able to engage in conversation and disguise impairments.      Exercises      General Comments        Pertinent Vitals/Pain Pain Assessment: No/denies pain    Home Living                      Prior Function  PT Goals (current goals can now be found in the care plan section) Acute Rehab PT Goals Patient Stated Goal: return home Potential to Achieve Goals: Good Progress towards PT goals: Progressing toward goals    Frequency    Min 3X/week      PT Plan Current plan remains appropriate    Co-evaluation              AM-PAC PT "6 Clicks" Mobility   Outcome Measure  Help needed turning from your back to your side while in a flat bed without using bedrails?: None Help needed moving from lying on your back to sitting on the side of a flat bed without using bedrails?: None Help needed  moving to and from a bed to a chair (including a wheelchair)?: None Help needed standing up from a chair using your arms (e.g., wheelchair or bedside chair)?: None Help needed to walk in hospital room?: None Help needed climbing 3-5 steps with a railing? : A Little 6 Click Score: 23    End of Session Equipment Utilized During Treatment: Gait belt Activity Tolerance: Patient tolerated treatment well Patient left: with call bell/phone within reach;in bed;Other (comment) Nurse Communication: Mobility status PT Visit Diagnosis: Other abnormalities of gait and mobility (R26.89);Difficulty in walking, not elsewhere classified (R26.2)     Time: 7017-7939 PT Time Calculation (min) (ACUTE ONLY): 26 min  Charges:  $Gait Training: 23-37 mins                     Carlos [name] , PTA Acute Rehabilitation Services Pager [phone] Office [phone]     Carlos [name] 10/07/2020, 11:38 AM

## 2020-10-07 NOTE — Progress Notes (Signed)
NAME:  Carlos [name], MRN:  [id number], DOB:  May 29, 1986, LOS: 11 ADMISSION DATE:  09/26/2020, CONSULTATION DATE:  09/28/2020 REFERRING MD:  Dr. Allena Katz, CHIEF COMPLAINT:  Pancreatitis w/necrosis   Brief History   34 year old admitted with acute pancreatitis, presumably due to ETOH abuse.  Developed clinical worsening on 11/21 with high CIWA scores, persistent tachycardia, tachypnea, new AKI, elevating t. Bili, and repeat CT showing new changes consistent with necrotizing pancreatitis.  GI and [facility]CS following.   Past Medical History  Tobacco abuse, ETOH use (reported 8+ beers and liquor/ daily) Otherwise no pertinent past medical history   Significant Hospital Events   11/19 Admitted [facility] 11/21 PCCM consulted, tx ICU 11/23 intubation, bronchoscopy, left pigtail placement 11/29 Bilateral chest tube removal  Consults:  GI- [facility] PCCM  Procedures:   Significant Diagnostic Tests:  11/19 CT abd/ pelvis >> 1. Severe interstitial edematous pancreatitis with early acute pancreatic collections. 2. Diminished enhancement throughout the gland not definitive at this time for signs of necrosis, perhaps due to severe pancreatic edema. Consider close follow-up based on clinical parameters to exclude the possibility of developing glandular necrosis. 3. Signs of ascites. 4. Small hiatal hernia and mild circumferential distal esophageal thickening.  Correlate with any clinical signs of esophagitis. 5. Probable hepatic steatosis.   11/19 limited RUQ [place] >> 1. Trace free fluid.  Hepatic steatosis. 2. Otherwise negative right upper quadrant   11/21 TTE >> LVEF 60-65%, no wall abnormalities, mild LVH, normal RV   11/21 CT abd w/contrast >> 1. Redemonstrated extensive inflammatory stranding about the pancreas and adjacent structures, with an interval increase in retroperitoneal inflammatory fluid. 2. There is new hypoenhancement of the majority of the pancreatic body and tail, involving approximately  the distal 9.2 cm of the pancreas. 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 3. There is a new fluid collection of the anterior pancreatic head measuring 3.2 x 2.9 cm, consistent with developing pseudocyst. 4. New small volume ascites. 5. New moderate bilateral pleural effusions and associated atelectasis or consolidation.  Micro Data:  11/19 SARS 2/ Flu >> neg 11/20 BCx 2 >> Neg 11/20 UC >> multiple species   Antimicrobials:  Zosyn 11/20 >> 11/25   Interim history/subjective:  Removed bilateral tubes yesterday. No chest pain, shortness of breath or cough.  Objective   Blood pressure 116/77, pulse 77, temperature 98.3 F (36.8 C), temperature source Oral, resp. rate (!) 25, height 6\' 5"  (1.956 m), weight 78.5 kg, SpO2 100 %.        Intake/Output Summary (Last 24 hours) at 10/07/2020 1051 Last data filed at 10/07/2020 1000 Gross per 24 hour  Intake 240 ml  Output 500 ml  Net -260 ml   Filed Weights   10/05/20 0527 10/06/20 0527 10/07/20 0403  Weight: 81.1 kg 79.3 kg 78.5 kg   Physical Exam: General: Well-appearing, no acute distress HENT: [place], AT, OP clear, MMM Eyes: EOMI, no scleral icterus Respiratory: Clear to auscultation bilaterally.  No crackles, wheezing or rales Cardiovascular: RRR, -M/R/G, no JVD Extremities:-Edema,-tenderness Neuro: AAO x4, CNII-XII grossly intact Skin: Intact, no rashes or bruising Psych: Normal mood, normal affect  CXR 11/30 with very small right pleural effusion/atelectasis.  Resolved Hospital Problem list   Acute kidney injury improved with IVF - Daily monitoring  Elevated WBC, Pct- presumed infection, some question of cholangitis but bili argues against, exudative effusion and thick mucus endobronchially, question aspiration - Zosyn x 5 days reasonable, end date today  Assessment & Plan:   Acute  hypoxemic respiratory failure secondary to atelectasis and third spacing, effusions, resolved. Remains on room air --Bilateral  chest tubes removed yesterday. Repeat CXR with very small right pleural effusion/atelectasis. --Encouraged IS, flutter and OOB I --Continue PT  Acute metabolic encephalopathy secondary to alcohol withdrawal- resolved. S/p phenobarb taper.  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  --Per primary team. Continue close electrolyte monitoring and repletion  Pulmonary will will sign off. Please re-consult for any questions or concerns.  Best practice:  Mobility: BR Code Status: Full Family Communication: Updated patient at bedside Disposition: Progressive care unit  Care Time: 15 min  Mechele Collin, M.D. [facility] Pulmonary/Critical Care Medicine 10/07/2020 10:52 AM

## 2020-10-07 NOTE — TOC Transition Note (Addendum)
Transition of Care (TOC) - CM/SW Discharge Note Donn Pierini RN, BSN Transitions of Care Unit 4E- RN Case Manager See Treatment Team for direct phone #    Patient Details  Name: Carlos [name] MRN: [id number] Date of Birth: Oct 21, 1986  Transition of Care [facility]) CM/SW Contact:  Darrold Span, RN Phone Number: 10/07/2020, 1:10 PM   Clinical Narrative:    Pt stable for transition home today, per PT recs for outpt therapy- pt agreeable and referral made to [facility] location via epic for outpt PT- RW has been delivered to room, pt declines need for 3n1.     Final next level of care: OP Rehab Barriers to Discharge: Barriers Resolved   Patient Goals and CMS Choice Patient states their goals for this hospitalization and ongoing recovery are:: to go home      Discharge Placement               Home         Discharge Plan and Services   Discharge Planning Services: CM Consult Post Acute Care Choice: Durable Medical Equipment          DME Arranged: Walker rolling DME Agency: [employer] Date DME Agency Contacted: 10/05/20     HH Arranged: NA HH Agency: NA        Social Determinants of Health (SDOH) Interventions     Readmission Risk Interventions Readmission Risk Prevention Plan 10/07/2020  Transportation Screening Complete  PCP or Specialist Appt within 5-7 Days Complete  Home Care Screening Complete  Medication Review (RN CM) Complete

## 2020-10-07 NOTE — Progress Notes (Signed)
PT Cancellation Note  Patient Details Name: Flint Hakeem MRN: [id number] DOB: [birth year]   Cancelled Treatment:     Just got PICC line removed on bed rest x 30 min.  Will f/u per POC.     Jillien Yakel Artis Delay 10/07/2020, 9:51 AM  Bonney Leitz , PTA Acute Rehabilitation Services Pager [phone] Office [phone]

## 2020-10-07 NOTE — Discharge Summary (Signed)
Discharge Summary  Carlos [name] GNF:[id number] DOB: 07/16/1986  PCP: Sharon SellerEubanks, Jessica K, NP  Admit date: 09/26/2020 Discharge date: 10/07/2020  Time spent: 35 minutes   Recommendations for Outpatient Follow-up:  1. Follow-up with GI in 1 to 2 weeks 2. Follow-up with your primary care provider within a week 3. Take your medications as prescribed 4. Continue PT OT outpatient. 5. Completely abstain from alcohol use. 6. Repeat CBC and BMP on Friday, October 10, 2020 at your PCPs office.  Discharge Diagnoses:  Active Hospital Problems   Diagnosis Date Noted  . Acute alcoholic pancreatitis 09/26/2020  . History of ETT   . Hyperglycemia 09/27/2020  . Hyponatremia 09/27/2020  . Transaminitis 09/27/2020  . Severe sepsis (HCC) 09/27/2020  . Alcohol withdrawal delirium (HCC)   . Alcohol dependence syndrome (HCC) 09/26/2020  . Tobacco dependence 09/26/2020    Resolved Hospital Problems  No resolved problems to display.    Discharge Condition: Stable  Diet recommendation: Continue a soft low-fat diet x4 weeks, as recommended by GI.  Vitals:   10/07/20 0821 10/07/20 1235  BP: 116/77 112/74  Pulse: 77 88  Resp: (!) 25 (!) 25  Temp: 98.3 F (36.8 C) 98.5 F (36.9 C)  SpO2: 100% 98%    History of present illness:  34 year old admitted with acute pancreatitis, presumably due to ETOH abuse.  Developed clinical worsening on 09/28/20 with high CIWA scores, persistent tachycardia, tachypnea, new AKI, elevating t. Bili, and repeat CT showing new changes consistent with necrotizing pancreatitis.  GI, [facility], and PCCM followed.   Past Medical History  Tobacco abuse, ETOH use (reported 8+ beers and liquor/ daily) Otherwise no pertinent past medical history   Significant Hospital Events   11/19 Admitted [facility] 11/21 PCCM consulted, tx ICU 11/23 intubation, bronchoscopy, left pigtail placement 11/29 Bilateral chest tube removal  Consults:  GI- [facility] P[facility]M  Procedures:    Significant Diagnostic Tests:  11/19 CT abd/ pelvis >> 1. Severe interstitial edematous pancreatitis with early acute pancreatic collections. 2. Diminished enhancement throughout the gland not definitive at this time for signs of necrosis, perhaps due to severe pancreatic edema. Consider close follow-up based on clinical parameters to exclude the possibility of developing glandular necrosis. 3. Signs of ascites. 4. Small hiatal hernia and mild circumferential distal esophageal thickening.  Correlate with any clinical signs of esophagitis. 5. Probable hepatic steatosis.   11/19 limited RUQ US >> 1. Trace free fluid. Hepatic steatosis. 2. Otherwise negative right upper quadrant   11/21 TTE >> LVEF 60-65%, no wall abnormalities, mild LVH, normal RV   11/21 CT abd w/contrast >> 1. Redemonstrated extensive inflammatory stranding about the pancreas and adjacent structures, with an interval increase in retroperitoneal inflammatory fluid. 2. There is new hypoenhancement of the majority of the pancreatic body and tail, involving approximately the distal 9.2 cm of the pancreas. 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 3. There is a new fluid collection of the anterior pancreatic head measuring 3.2 x 2.9 cm, consistent with developing pseudocyst. 4. New small volume ascites. 5. New moderate bilateral pleural effusions and associated atelectasis or consolidation.  Micro Data:  11/19 SARS 2/ Flu >> neg 11/20 BCx 2 >> Neg 11/20 UC >> multiple species   Antimicrobials:  Zosyn 11/20 >> 11/25    10/07/20: Seen and examined at his bedside.  No acute events overnight.  He has no new complaints.  He denies any abdominal pain or nausea.  Afebrile.  He has been tolerating a soft diet.  GI recommended to continue a soft low-fat diet x4 weeks.  Patient understands and agrees to plan.  He is eager to go home.    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 Currently off antibiotics Monitor fever curve and WBC Diet advanced from clears to full liquids on 10/04/2020, then to soft diet on 10/06/2020, tolerating well. Seen by GI, signed off on 10/05/2020. Advised to remain on soft low fat diet for the next 4 weeks as recommended by GI.    Leukocytosis, suspect reactive in the setting of necrotizing pancreatitis  WBC 12.7K on October 07, 2020, however afebrile, nontoxic-appearing, procalcitonin level 0.43 on October 06, 2020. Repeat CBC outpatient on Friday at your PCP office. Follow-up with GI  Mild hyponatremia Asymptomatic Serum sodium 131 on October 07, 2020 Monitor for now, repeat BMP at your PCPs office on October 10, 2020. Follow-up with your PCP  Resolved hypoxic respiratory failure, bilateral chest tubes in place on suction. Currently on room air with O2 saturation 98% on room air Bilateral chest tubes removed on October 06, 2020 by pulmonary.  No chest pain or shortness of breath or cough. Chest x-ray done on October 07, 2020 showed mild bibasilar atelectasis.  Tiny right pleural effusion cannot be excluded.  Similar findings noted on prior exam.  Alcohol use disorder No evidence of alcohol withdrawal at time of this visit Received CIWA protocol Klonopin 0.5 mg twice daily as needed for anxiety x7 days. Completely abstain from alcohol use.  Patient understands and agrees with plan.  Essential hypertension BP is at goal Continue current management, Norvasc 5 mg daily Follow-up with PCP  GERD Stable Continue Protonix 40 mg twice daily. Follow-up with GI  Code Status: Full code  Family  Communication: Updated his girlfriend at bedside on 10/06/2020    Consultants:  GI, signed off on 10/05/2020  Pulmonary, signed off on October 07, 2020.  Procedures:  Bilateral chest tubes placement  Bilateral chest tubes removal on October 06, 2020    Discharge Exam: BP 112/74 (BP Location: Left Arm)   Pulse 88   Temp 98.5 F (36.9 C) (Oral)   Resp (!) 25   Ht  (1.956 m)   Wt 78.5 kg   SpO2 98%   BMI 20.51 kg/m  . General: 34 y.o. year-old male well developed well nourished in no acute distress.  Alert and oriented x3. . Cardiovascular: Regular rate and rhythm with no rubs or gallops.  No thyromegaly or JVD noted.   Marland Kitchen Respiratory: Clear to auscultation with no wheezes or rales. Good inspiratory effort. . Abdomen: Soft nontender nondistended with normal bowel sounds x4 quadrants. . Musculoskeletal: No lower extremity edema bilaterally.  Marland Kitchen Psychiatry: Mood is appropriate for condition and setting  Discharge Instructions You were cared for by a hospitalist during your hospital stay. If you have any questions about your discharge medications or the care you received while you were in the hospital after you are discharged, you can call the unit and asked to speak with the hospitalist on call if the hospitalist that took care of you is not available. Once you are discharged, your primary care physician will handle any further medical issues. Please  note that NO REFILLS for any discharge medications will be authorized once you are discharged, as it is imperative that you return to your primary care physician (or establish a relationship with a primary care physician if you do not have one) for your aftercare needs so that they can reassess your need for medications and monitor your lab values.   Allergies as of 10/07/2020      Reactions   Other Anaphylaxis   boysenberry      Medication List    TAKE these medications   amLODipine 5 MG tablet Commonly known as:  NORVASC Take 1 tablet (5 mg total) by mouth daily. Start taking on: October 08, 2020   Centrum tablet Take 1 tablet by mouth daily.   clonazePAM 0.5 MG tablet Commonly known as: KLONOPIN Take 1 tablet (0.5 mg total) by mouth 2 (two) times daily as needed for up to 7 days for anxiety (anxiety).   folic acid 1 MG tablet Commonly known as: FOLVITE Take 1 tablet (1 mg total) by mouth daily. Start taking on: October 08, 2020   metFORMIN 500 MG tablet Commonly known as: Glucophage Take 1 tablet (500 mg total) by mouth 2 (two) times daily with a meal.   pantoprazole 40 MG tablet Commonly known as: PROTONIX Take 1 tablet (40 mg total) by mouth 2 (two) times daily.   QUEtiapine 50 MG tablet Commonly known as: SEROQUEL Take 1 tablet (50 mg total) by mouth 2 (two) times daily.   thiamine 100 MG tablet Take 1 tablet (100 mg total) by mouth daily. Start taking on: October 08, 2020            Durable Medical Equipment  (From admission, onward)         Start     Ordered   10/06/20 1900  For home use only DME 3 n 1  Once        10/06/20 1859   10/05/20 1021  For home use only DME Walker rolling  Once       Question Answer Comment  Walker: With 5 Inch Wheels   Patient needs a walker to treat with the following condition Weakness      10/05/20 1020   10/05/20 0503  For home use only DME 3 n 1  Once        10/05/20 0504         Allergies  Allergen Reactions  . Other Anaphylaxis    boysenberry    Follow-up Information    Sharon Seller, NP. Call in 1 day(s).   Specialty: Geriatric Medicine Why: Please call for a post hospital follow-up appointment Contact information: [address] [place] [address] [phone]        Charlott Rakes, MD. Call in 1 day(s).   Specialty: Gastroenterology Why: Please call for a post hospital follow-up appointment. Contact information: [address] [place] [address] [phone]         [facility] Follow up.   Specialty: Rehabilitation Why: outpt PT referral made- they will contact you to f/u on scheduling Contact information: [address] [id number] mc [place] [address] [phone]               The results of significant diagnostics from this hospitalization (including imaging, microbiology, ancillary and laboratory) are listed below for reference.    Significant Diagnostic Studies: DG Chest 1 View  Result Date: 09/30/2020 CLINICAL DATA:  Chest tube in  place EXAM: CHEST  1 VIEW.  Patient is markedly rotated. COMPARISON:  Chest x-ray 09/30/2020 6:12 p.m. FINDINGS: Endotracheal tube with tip terminating approximately 5 cm above the carina. Right PICC with tip overlying the right atrium. Enteric tube coursing below diaphragm with tip collimated off view. Interval placement of a left chest tube pigtail overlying the left hemithorax. The heart size and mediastinal contours are unchanged. Low lung volumes. Suggestion of a right lower lobe collapse. Interstitial markings of the left lung likely due to re-expansion edema. No pulmonary edema. Interval decrease of a now trace left pleural effusion. Persistent at least small right pleural effusion. No significant pneumothorax. No acute osseous abnormality. IMPRESSION: 1. Left chest tube overlying the left hemithorax with interval marked improved trace left pleural effusion. No associated significant pneumothorax. Interstitial markings of the left lung likely due to re-expansion edema. Attention on follow-up. 2. Suggestion of interval development of a right lower lobe collapse. Recommend re-evaluation on chest x-ray with improved inspiratory effort and no patient rotation. 3. Persistent at least small small right pleural effusion. 4. Right PICC overlies the right atrium. Attention on follow-up on chest x-ray with improved inspiratory effort and no patient rotation.  Electronically Signed   By: Tish Frederickson M.D.   On: 09/30/2020 19:49   DG Chest 1 View  Result Date: 09/29/2020 CLINICAL DATA:  Hypoxia EXAM: CHEST  1 VIEW COMPARISON:  09/28/2020 FINDINGS: A feeding tube is been placed and has enter the stomach. Low lung volumes are present, causing crowding of the pulmonary vasculature. Mild enlargement of the cardiopericardial silhouette noted with airspace opacities along both lung bases. These could be from atelectasis or pneumonia. Blunted left costophrenic angle, pleural effusion not excluded. There is mild rightward deviation of the trachea, some degree of atelectasis medially in the right upper lobe is a distinct possibility. Follow up imaging to ensure clearance and exclude the possibility of a central obstructing lesion is recommended. IMPRESSION: 1. Bibasilar airspace opacities may be from atelectasis or pneumonia. 2. Mild enlargement of the cardiopericardial silhouette. 3. Potential small left pleural effusion. 4. Medial right upper lobe atelectasis, follow up imaging with attention to this region is recommended to exclude a central obstructing lesion. Electronically Signed   By: Gaylyn Rong M.D.   On: 09/29/2020 14:21   CT ABDOMEN W CONTRAST  Result Date: 09/28/2020 CLINICAL DATA:  Abdominal pain, fever, follow-up pancreatitis, concern for necrosis EXAM: CT ABDOMEN WITH CONTRAST TECHNIQUE: Multidetector CT imaging of the abdomen was performed using the standard protocol following bolus administration of intravenous contrast. CONTRAST:  OMNIPAQUE IOHEXOL 300 MG/ML  SOLN COMPARISON:  09/26/2020 FINDINGS: Lower chest: New moderate bilateral pleural effusions and associated atelectasis or consolidation. Hepatobiliary: No focal liver abnormality is seen. No gallstones, gallbladder wall thickening, or biliary dilatation. Pancreas: Redemonstrated extensive inflammatory stranding about the pancreas and adjacent structures, with an interval increase in  retroperitoneal inflammatory fluid. There is new hypoenhancement of the majority of the pancreatic body and tail, involving approximately the distal 9.2 cm of the pancreas (series 3, image 27). There is a new fluid collection of the anterior pancreatic head measuring 3.2 x 2.9 cm (series 3, image 37). The pancreatic duct cannot be distinctly visualized. Spleen: Normal in size without focal abnormality. Adrenals/Urinary Tract: Adrenal glands are unremarkable. Kidneys are normal, without renal calculi, focal lesion, or hydronephrosis. Bladder is unremarkable. Stomach/Bowel: Stomach is within normal limits. Appendix appears normal. No evidence of bowel wall thickening, distention, or inflammatory changes. Vascular/Lymphatic: No significant vascular findings  are present. No enlarged abdominal or pelvic lymph nodes. Other: No abdominal wall hernia or abnormality. New small volume perihepatic and perisplenic ascites. Musculoskeletal: No acute or significant osseous findings. IMPRESSION: 1. Redemonstrated extensive inflammatory stranding about the pancreas and adjacent structures, with an interval increase in retroperitoneal inflammatory fluid. 2. There is new hypoenhancement of the majority of the pancreatic body and tail, involving approximately the distal 9.2 cm of the pancreas. 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 3. There is a new fluid collection of the anterior pancreatic head measuring 3.2 x 2.9 cm, consistent with developing pseudocyst. 4. New small volume ascites. 5. New moderate bilateral pleural effusions and associated atelectasis or consolidation. Electronically Signed   By: Lauralyn Primes M.D.   On: 09/28/2020 11:03   CT ABDOMEN PELVIS W CONTRAST  Result Date: 09/26/2020 CLINICAL DATA:  Nausea vomiting and epigastric pain since 9 p.m. last night. EXAM: CT ABDOMEN AND PELVIS WITH CONTRAST TECHNIQUE: Multidetector CT imaging of the abdomen and pelvis was performed using the standard protocol  following bolus administration of intravenous contrast. CONTRAST:  34mL OMNIPAQUE IOHEXOL 300 MG/ML  SOLN COMPARISON:  None. FINDINGS: Lower chest: Basilar atelectasis.  No effusion.  No consolidation. Hepatobiliary: No focal, suspicious hepatic lesion. Portal vein is patent. No pericholecystic stranding. No gross biliary duct dilation. Splenic vein also patent. Pancreas: Severe interstitial edematous pancreatitis with peripancreatic fluid tracking into the transverse mesocolon and anterior pararenal space., early acute pancreatic fluid collections. Enhancement of the gland is diminished diffusely node not definite this time for pancreatic necrosis. Spleen: Normal Adrenals/Urinary Tract: Adrenal glands are normal. Symmetric renal enhancement.  Urinary bladder is unremarkable. Stomach/Bowel: Stomach with signs of mild edema due to adjacent inflammation. Small hiatal hernia and mild circumferential distal esophageal thickening. Small bowel is of normal caliber. The appendix is normal. Mild colonic edema in the setting of acute pancreatitis. Vascular/Lymphatic: Patent splenic and portal vein as described. Patent SMV. Normal caliber abdominal aorta. There is no gastrohepatic or hepatoduodenal ligament lymphadenopathy. No retroperitoneal or mesenteric lymphadenopathy. Reproductive: Prostate unremarkable by CT. No pelvic lymphadenopathy Other: Signs of ascites.  No free air. Musculoskeletal: No acute bone finding. No destructive bone process. IMPRESSION: 1. Severe interstitial edematous pancreatitis with early acute pancreatic collections. 2. Diminished enhancement throughout the gland not definitive at this time for signs of necrosis, perhaps due to severe pancreatic edema. Consider close follow-up based on clinical parameters to exclude the possibility of developing glandular necrosis. 3. Signs of ascites. 4. Small hiatal hernia and mild circumferential distal esophageal thickening. Correlate with any clinical signs of  esophagitis. 5. Probable hepatic steatosis. Electronically Signed   By: Donzetta Kohut M.D.   On: 09/26/2020 10:14   US RENAL  Result Date: 09/29/2020 CLINICAL DATA:  Acute kidney injury EXAM: RENAL / URINARY TRACT ULTRASOUND COMPLETE COMPARISON:  CT 09/28/2020 FINDINGS: Right Kidney: Renal measurements: 10 x 5.6 x 5.7 cm = volume: 167.7 mL. Echogenicity within normal limits. No mass or hydronephrosis visualized. Left Kidney: Renal measurements: 10.5 x 7.1 x 5.8 cm = volume: 226.4 mL. Echogenicity within normal limits. No mass or hydronephrosis visualized. Bladder: Foley catheter in the decompressed urinary bladder. Other: Small amount of abdominal ascites. IMPRESSION: 1. Ultrasound appearance of the kidneys is within normal limits. 2. Small amount of ascites Electronically Signed   By: Jasmine Pang M.D.   On: 09/29/2020 21:39   DG CHEST PORT 1 VIEW  Result Date: 10/07/2020 CLINICAL DATA:  Pleural effusion. EXAM: PORTABLE CHEST 1 VIEW COMPARISON:  No prior.  FINDINGS: Right PICC line stable position. Heart size stable. Mild bibasilar atelectasis. Tiny right pleural effusion cannot be excluded. Similar findings noted on prior exam. No pneumothorax. IMPRESSION: 1. Right PICC line stable position. 2. Mild bibasilar atelectasis. Tiny right pleural effusion cannot be excluded. Similar findings noted on prior exam. Electronically Signed   By: Maisie Fus  Register   On: 10/07/2020 05:55   DG CHEST PORT 1 VIEW  Result Date: 10/06/2020 CLINICAL DATA:  Pleural effusions. EXAM: PORTABLE CHEST 1 VIEW COMPARISON:  October 05, 2020. FINDINGS: Stable cardiomediastinal silhouette. No pneumothorax is noted. Bilateral chest tubes have been removed without pneumothorax. Left lung is clear. Mild right basilar subsegmental atelectasis is noted with probable small right pleural effusion. Right-sided PICC line is unchanged in position. Bony thorax is unremarkable. IMPRESSION: Bilateral chest tubes have been removed without  pneumothorax. Mild right basilar subsegmental atelectasis is noted with probable small right pleural effusion. Electronically Signed   By: Lupita Raider M.D.   On: 10/06/2020 11:36   DG CHEST PORT 1 VIEW  Result Date: 10/05/2020 CLINICAL DATA:  Status post extubation EXAM: PORTABLE CHEST 1 VIEW COMPARISON:  October 01, 2020 FINDINGS: Endotracheal tube and feeding tube have been removed. Chest tubes are unchanged in position on each side. Central catheter tip is in the superior vena cava. No pneumothorax. There is atelectatic change in the lung bases. Lungs otherwise are clear. Heart is slightly enlarged with pulmonary vascularity normal. No adenopathy. No bone lesions. IMPRESSION: Tube and catheter positions as described without pneumothorax. Bibasilar atelectasis. No edema or airspace opacity. Stable cardiac prominence. Electronically Signed   By: Bretta Bang III M.D.   On: 10/05/2020 08:09   DG Chest Port 1 View  Result Date: 10/01/2020 CLINICAL DATA:  Follow-up right chest tube placement EXAM: PORTABLE CHEST 1 VIEW COMPARISON:  Earlier same day FINDINGS: Endotracheal tube tip is 5 cm above the carina. Soft feeding tube enters the abdomen. Bilateral pleural catheters are in place. Diminishing pleural density and improving pulmonary aeration on both sides. No pleural air. Right arm PICC tip in the SVC 1 cm above the right atrium. IMPRESSION: Lines and tubes well positioned. Bilateral pleural catheters now in place. Diminishing pleural density and improving pulmonary aeration on both sides. No pneumothorax. Electronically Signed   By: Paulina Fusi M.D.   On: 10/01/2020 16:22   DG Chest Port 1 View  Result Date: 10/01/2020 CLINICAL DATA:  Hypoxia EXAM: PORTABLE CHEST 1 VIEW COMPARISON:  September 30, 2020 FINDINGS: Endotracheal tube tip is 4.9 cm above the carina. Chest tube present on the left. Feeding tube tip is below the diaphragm. Central catheter tip is in the superior vena cava near the  cavoatrial junction. No pneumothorax appreciable. There is a pleural effusion on each side with consolidation in the left lower lobe region. There is atelectatic change in the right mid and lower lung regions. Heart is mildly prominent with pulmonary vascularity normal. No adenopathy. No bone lesions. IMPRESSION: Tube and catheter positions as described without evident pneumothorax. Pleural effusions bilaterally with consolidation concerning for a degree of pneumonia left lower lobe. Areas of relatively mild atelectatic change on the right. Stable cardiac prominence. Electronically Signed   By: Bretta Bang III M.D.   On: 10/01/2020 08:18   DG Chest Port 1 View  Result Date: 09/30/2020 CLINICAL DATA:  34 year old male with intubation. EXAM: PORTABLE CHEST 1 VIEW COMPARISON:  Chest radiograph dated 09/29/2020. FINDINGS: Endotracheal tube with tip approximately 3 cm above the carina. Feeding  tube extends below the diaphragm with tip beyond the inferior margin of the image but likely in the duodenum. Right-sided PICC with tip close to the cavoatrial junction. Bilateral pleural effusions, increased since the prior radiograph. There is near complete opacification of the left hemithorax with only a small aerated portion of the left lung. No pneumothorax. Stable cardiac silhouette. No acute osseous pathology. IMPRESSION: 1. Endotracheal tube above the carina. 2. Bilateral pleural effusions, increased since the prior radiograph. Near complete consolidation of the left lung. Electronically Signed   By: Elgie Collard M.D.   On: 09/30/2020 18:43   DG Chest Port 1 View  Result Date: 09/28/2020 CLINICAL DATA:  Respiratory distress EXAM: PORTABLE CHEST 1 VIEW COMPARISON:  09/27/2020, CT 09/28/2020 FINDINGS: Low lung volume. Small left-sided pleural effusion, CT demonstrated right pleural effusion not well seen on radiograph. Increased airspace disease at the left lung base. Stable cardiomediastinal silhouette  with borderline cardiac enlargement. No pneumothorax. IMPRESSION: Low lung volumes. Small left-sided pleural effusion with increased airspace disease at the left lung base, atelectasis versus pneumonia. CT demonstrated right pleural effusion not well seen radiographically. Electronically Signed   By: Jasmine Pang M.D.   On: 09/28/2020 15:52   DG CHEST PORT 1 VIEW  Result Date: 09/27/2020 CLINICAL DATA:  Initial evaluation for acute respiratory distress. EXAM: PORTABLE CHEST 1 VIEW COMPARISON:  Prior radiograph from 09/26/2020. FINDINGS: Cardiac and mediastinal silhouette stable, and remain within normal limits. Lungs are hypoinflated with secondary mild bibasilar subsegmental atelectasis. No focal infiltrates. No edema or effusion. No pneumothorax. Osseous structures unchanged. IMPRESSION: 1. Low lung volumes with secondary mild bibasilar subsegmental atelectasis. 2. No other active cardiopulmonary disease. Electronically Signed   By: Rise Mu M.D.   On: 09/27/2020 03:37   DG ABD ACUTE 2+V W 1V CHEST  Result Date: 09/26/2020 CLINICAL DATA:  Abdominal pain, chest pain and abdominal pain, anteromedial chest pain in a 34 year old EXAM: DG ABDOMEN ACUTE WITH 1 VIEW CHEST COMPARISON:  None FINDINGS: Trachea midline. Cardiomediastinal contours and hilar structures are normal. Lungs are clear. No sign of effusion. No free air beneath either RIGHT or LEFT hemidiaphragm. Mildly dilated small bowel loops in the LEFT mid central abdomen. Stool and gas in the colon without definite rectal gas. No abnormal calcifications.  No organomegaly. On limited assessment no acute skeletal process. IMPRESSION: 1. No acute cardiopulmonary disease. 2. Mildly dilated small bowel loops in the mid abdomen on the LEFT may represent ileus or early partial small bowel obstruction. Electronically Signed   By: Donzetta Kohut M.D.   On: 09/26/2020 09:04   DG Abd Portable 1V  Result Date: 09/29/2020 CLINICAL DATA:   34 year old male with feeding tube placement. EXAM: PORTABLE ABDOMEN - 1 VIEW COMPARISON:  Abdominal radiograph dated 09/29/2020. FINDINGS: Interval advancement of the feeding tube with tip in the distal duodenum close to the expected location of the ligament of Treitz. Mildly dilated loops of small bowel measure up to 4.5 cm in the right lower quadrant. IMPRESSION: Interval advancement of the feeding tube with tip in the distal duodenum. Electronically Signed   By: Elgie Collard M.D.   On: 09/29/2020 15:38   DG Abd Portable 1V  Result Date: 09/29/2020 CLINICAL DATA:  Feeding tube placement EXAM: PORTABLE ABDOMEN - 1 VIEW COMPARISON:  None. FINDINGS: Feeding tube is in place with the tip in the transverse duodenum. IMPRESSION: Feeding tube tip in the transverse duodenum. Electronically Signed   By: Charlett Nose M.D.   On: 09/29/2020 10:16  ECHOCARDIOGRAM COMPLETE  Result Date: 09/28/2020    ECHOCARDIOGRAM REPORT   Patient Name:   JORIEL STREETY  Date of Exam: 09/28/2020 Medical Rec #:  [id number]  Height:       77.0 in Accession #:    [id number] Weight:       176.5 lb Date of Birth:  1986/08/18   BSA:          2.121 m Patient Age:    34 years   BP:           148/105 mmHg Patient Gender: M          HR:           120 bpm. Exam Location:  Inpatient Procedure: 2D Echo, Cardiac Doppler and Color Doppler Indications:    ; R00.0 Tachycardia  History:        Patient has no prior history of Echocardiogram examinations.                 Arrythmias:PAC; Signs/Symptoms:Bacteremia and Altered Mental                 Status. ETOH. Delirium. Pancreatitis.  Sonographer:    Sheralyn Boatman RDCS Referring Phys: [phone] EKTA V PATEL  Sonographer Comments: Suboptimal subcostal window and Technically difficult study due to poor echo windows. Patient had hiccups througout exam. Could not follow directions. IMPRESSIONS  1. Left ventricular ejection fraction, by estimation, is 60 to 65%. The left ventricle has normal function. The left  ventricle has no regional wall motion abnormalities. There is mild left ventricular hypertrophy. Left ventricular diastolic parameters were normal.  2. Right ventricular systolic function is normal. The right ventricular size is normal.  3. The mitral valve is normal in structure. No evidence of mitral valve regurgitation. No evidence of mitral stenosis.  4. The aortic valve is tricuspid. Aortic valve regurgitation is not visualized. No aortic stenosis is present.  5. The inferior vena cava is normal in size with greater than 50% respiratory variability, suggesting right atrial pressure of 3 mmHg. FINDINGS  Left Ventricle: Left ventricular ejection fraction, by estimation, is 60 to 65%. The left ventricle has normal function. The left ventricle has no regional wall motion abnormalities. The left ventricular internal cavity size was normal in size. There is  mild left ventricular hypertrophy. Left ventricular diastolic parameters were normal. Right Ventricle: The right ventricular size is normal. No increase in right ventricular wall thickness. Right ventricular systolic function is normal. Left Atrium: Left atrial size was normal in size. Right Atrium: Right atrial size was normal in size. Pericardium: There is no evidence of pericardial effusion. Mitral Valve: The mitral valve is normal in structure. No evidence of mitral valve regurgitation. No evidence of mitral valve stenosis. Tricuspid Valve: The tricuspid valve is normal in structure. Tricuspid valve regurgitation is mild . No evidence of tricuspid stenosis. Aortic Valve: The aortic valve is tricuspid. Aortic valve regurgitation is not visualized. No aortic stenosis is present. Pulmonic Valve: The pulmonic valve was normal in structure. Pulmonic valve regurgitation is not visualized. No evidence of pulmonic stenosis. Aorta: The aortic root is normal in size and structure. Venous: The inferior vena cava is normal in size with greater than 50% respiratory  variability, suggesting right atrial pressure of 3 mmHg. IAS/Shunts: No atrial level shunt detected by color flow Doppler.  LEFT VENTRICLE PLAX 2D LVIDd:         4.30 cm      Diastology LVIDs:  3.20 cm      LV e' medial:    15.80 cm/s LV PW:         1.20 cm      LV E/e' medial:  3.6 LV IVS:        1.40 cm      LV e' lateral:   9.03 cm/s LVOT diam:     2.30 cm      LV E/e' lateral: 6.2 LV SV:         56 LV SV Index:   26 LVOT Area:     4.15 cm  LV Volumes (MOD) LV vol d, MOD A2C: 62.1 ml LV vol d, MOD A4C: 109.0 ml LV vol s, MOD A2C: 20.5 ml LV vol s, MOD A4C: 47.3 ml LV SV MOD A2C:     41.6 ml LV SV MOD A4C:     109.0 ml LV SV MOD BP:      50.7 ml RIGHT VENTRICLE RV S prime:     20.80 cm/s TAPSE (M-mode): 1.9 cm LEFT ATRIUM             Index       RIGHT ATRIUM           Index LA diam:        3.60 cm 1.70 cm/m  RA Area:     12.10 cm LA Vol (A2C):   18.9 ml 8.91 ml/m  RA Volume:   27.50 ml  12.96 ml/m LA Vol (A4C):   26.6 ml 12.54 ml/m LA Biplane Vol: 22.7 ml 10.70 ml/m  AORTIC VALVE             PULMONIC VALVE LVOT Vmax:   102.00 cm/s PR End Diast Vel: 1.99 msec LVOT Vmean:  61.700 cm/s LVOT VTI:    0.134 m  AORTA Ao Root diam: 3.40 cm Ao Asc diam:  3.00 cm MITRAL VALVE MV Area (PHT): 7.44 cm    SHUNTS MV Decel Time: 102 msec    Systemic VTI:  0.13 m MV E velocity: 56.20 cm/s  Systemic Diam: 2.30 cm MV A velocity: 76.40 cm/s MV E/A ratio:  0.74 Charlton Haws MD Electronically signed by Charlton Haws MD Signature Date/Time: 09/28/2020/11:17:52 AM    Final    [place] EKG SITE RITE  Result Date: 09/30/2020 If Site Rite image not attached, placement could not be confirmed due to current cardiac rhythm.  US Abdomen Limited RUQ (LIVER/GB)  Result Date: 09/26/2020 CLINICAL DATA:  34 year old male with acute pancreatitis. EXAM: ULTRASOUND ABDOMEN LIMITED RIGHT UPPER QUADRANT COMPARISON:  CT Abdomen and Pelvis 0939 hours today. FINDINGS: Gallbladder: No gallstones or wall thickening visualized. No sonographic  Murphy sign noted by sonographer. Common bile duct: Diameter: 2 mm, normal. Liver: Echogenic liver (image 30). Trace perihepatic free fluid (image 16). No discrete liver lesion. No intrahepatic ductal dilatation. Portal vein is patent on color Doppler imaging with normal direction of blood flow towards the liver. Other: Negative visible right kidney. IMPRESSION: 1. Trace free fluid.  Hepatic steatosis. 2. Otherwise negative right upper quadrant Electronically Signed   By: Odessa Fleming M.D.   On: 09/26/2020 16:02    Microbiology: Recent Results (from the past 240 hour(s))  Culture, body fluid-bottle     Status: None   Collection Time: 09/30/20  7:32 PM   Specimen: Fluid  Result Value Ref Range Status   Specimen Description FLUID PLEURAL  Final   Special Requests BOTTLES DRAWN AEROBIC AND ANAEROBIC  Final   Culture  Final    NO GROWTH 5 DAYS Performed at [facility], [address] [place], [place] [address]    Report Status 10/05/2020 FINAL  Final  Gram stain     Status: None   Collection Time: 09/30/20  7:32 PM   Specimen: Fluid  Result Value Ref Range Status   Specimen Description FLUID PLEURAL  Final   Special Requests NONE  Final   Gram Stain   Final    RARE WBC PRESENT,BOTH PMN AND MONONUCLEAR NO ORGANISMS SEEN Performed at [facility], [address], [place], [place] [address]    Report Status 09/30/2020 FINAL  Final     Labs: Basic Metabolic Panel: Recent Labs  Lab 10/01/20 2027 10/01/20 2027 10/02/20 0558 10/02/20 0558 10/02/20 1813 10/02/20 1813 10/03/20 0605 10/04/20 0205 10/05/20 0518 10/06/20 0536 10/07/20 0403  NA 132*   < > 141   < > 140   < > 137 136 136 137 131*  K 3.0*   < > 3.7   < > 3.7   < > 3.8 3.8 3.8 3.5 3.7  CL 98   < > 106   < > 102   < > 99 99 100 101 101  CO2 23   < > 25   < > 30   < > 25 23 25 25 23   GLUCOSE 209*   < > 248*   < > 214*   < > 208* 188* 182* 140* 147*  BUN 6   < > 6   < > 6   < > 5* 6 5* <5* <5*  CREATININE 0.84    < > 1.03   < > 1.05   < > 0.99 1.03 1.00 0.92 0.95  CALCIUM 7.1*   < > 7.9*   < > 8.4*   < > 8.5* 8.6* 8.8* 8.9 8.7*  MG  --    < > 2.1  --  2.1  --  1.9 1.9  --   --  1.9  PHOS 1.8*  --  3.3  --  4.3  --  4.2 3.6  --   --   --    < > = values in this interval not displayed.   Liver Function Tests: Recent Labs  Lab 10/02/20 0558 10/03/20 0605 10/04/20 0205 10/05/20 0518 10/06/20 0536  AST 26 23 25  32 38  ALT 23 23 24 30  38  ALKPHOS 92 118 98 85 75  BILITOT 1.6* 2.0* 1.4* 1.0 0.7  PROT 5.0* 6.0* 6.3* 6.3* 6.1*  ALBUMIN 2.3* 2.6* 2.6* 2.6* 2.6*   No results for input(s): LIPASE, AMYLASE in the last 168 hours. No results for input(s): AMMONIA in the last 168 hours. CBC: Recent Labs  Lab 10/03/20 0605 10/04/20 0205 10/05/20 0518 10/06/20 0536 10/07/20 0403  WBC 13.6* 12.5* 11.4* 12.1* 12.7*  NEUTROABS  --   --  8.5* 9.3* 10.0*  HGB 10.3* 11.5* 10.8* 10.6* 10.3*  HCT 30.4* 33.8* 31.7* 31.5* 31.1*  MCV 107.0* 106.0* 104.3* 104.7* 106.1*  PLT 304 360 449* 469* 481*   Cardiac Enzymes: No results for input(s): CKTOTAL, CKMB, CKMBINDEX, TROPONINI in the last 168 hours. BNP: BNP (last 3 results) Recent Labs    09/27/20 1032  BNP 77.3    ProBNP (last 3 results) No results for input(s): PROBNP in the last 8760 hours.  CBG: Recent Labs  Lab 10/06/20 2008 10/06/20 2346 10/07/20 0354 10/07/20 0828 10/07/20 1240  GLUCAP 107* 187*  140* 143* 160*       Signed:  Darlin Drop, MD [facility] 10/07/2020, 1:33 PM

## 2020-10-07 NOTE — Discharge Instructions (Signed)
Acute Pancreatitis    Acute pancreatitis happens when the pancreas gets swollen. The pancreas is a large gland in the body that helps to control blood sugar. It also makes enzymes that help to digest food.  This condition can last a few days and cause serious problems. The lungs, heart, and kidneys may stop working.  What are the causes?  Causes include:  · Alcohol abuse.  · Drug abuse.  · Gallstones.  · A tumor in the pancreas.  Other causes include:  · Some medicines.  · Some chemicals.  · Diabetes.  · An infection.  · Damage caused by an accident.  · The poison (venom) from a scorpion bite.  · Belly (abdominal) surgery.  · The body's defense system (immune system) attacking the pancreas (autoimmune pancreatitis).  · Genes that are passed from parent to child (inherited).  In some cases, the cause is not known.  What are the signs or symptoms?  · Pain in the upper belly that may be felt in the back. The pain may be very bad.  · Swelling of the belly.  · Feeling sick to your stomach (nauseous) and throwing up (vomiting).  · Fever.  How is this treated?  You will likely have to stay in the hospital. Treatment may include:  · Pain medicine.  · Fluid through an IV tube.  · Placing a tube in the stomach to take out the stomach contents. This may help you stop throwing up.  · Not eating for 3-4 days.  · Antibiotic medicines, if you have an infection.  · Treating any other problems that may be the cause.  · Steroid medicines, if your problem is caused by your defense system attacking your body's own tissues.  · Surgery.  Follow these instructions at home:  Eating and drinking    · Follow instructions from your doctor about what to eat and drink.  · Eat foods that do not have a lot of fat in them.  · Eat small meals often. Do not eat big meals.  · Drink enough fluid to keep your pee (urine) pale yellow.  · Do not drink alcohol if it caused your condition.  Medicines  · Take over-the-counter and prescription medicines only  as told by your doctor.  · Ask your doctor if the medicine prescribed to you:  ? Requires you to avoid driving or using heavy machinery.  ? Can cause trouble pooping (constipation). You may need to take steps to prevent or treat trouble pooping:  § Take over-the-counter or prescription medicines.  § Eat foods that are high in fiber. These include beans, whole grains, and fresh fruits and vegetables.  § Limit foods that are high in fat and sugar. These include fried or sweet foods.  General instructions  · Do not use any products that contain nicotine or tobacco, such as cigarettes, e-cigarettes, and chewing tobacco. If you need help quitting, ask your doctor.  · Get plenty of rest.  · Check your blood sugar at home as told by your doctor.  · Keep all follow-up visits as told by your doctor. This is important.  Contact a doctor if:  · You do not get better as quickly as expected.  · You have new symptoms.  · Your symptoms get worse.  · You have pain or weakness that lasts a long time.  · You keep feeling sick to your stomach.  · You get better and then you have pain again.  ·   You have a fever.  Get help right away if:  · You cannot eat or keep fluids down.  · Your pain gets very bad.  · Your skin or the white part of your eyes turns yellow.  · You have sudden swelling in your belly.  · You throw up.  · You feel dizzy or you pass out (faint).  · Your blood sugar is high (over 300 mg/dL).  Summary  · Acute pancreatitis happens when the pancreas gets swollen.  · This condition is often caused by alcohol abuse, drug abuse, or gallstones.  · You will likely have to stay in the hospital for treatment.  This information is not intended to replace advice given to you by your health care provider. Make sure you discuss any questions you have with your health care provider.  Document Revised: 08/14/2018 Document Reviewed: 08/14/2018  Elsevier Patient Education © 2020 [employer]

## 2020-10-09 ENCOUNTER — Telehealth: Payer: Self-pay | Admitting: *Deleted

## 2020-10-09 NOTE — Telephone Encounter (Signed)
I have made the 1st attempt to contact the patient or family member in charge, in order to follow up from recently being discharged from the hospital.   Spoke with Ms. Finkler and she stated that patient was not available but will let him know we called.  Stated that patient does not have any health insurance at this time and it will be at least 30 days before he does have it.  Stated that when they call back to schedule an appointment they will speak with billing for out of pocket expense.   Awaiting call back from patient for TOC question update.

## 2021-08-14 ENCOUNTER — Encounter (HOSPITAL_COMMUNITY): Payer: Self-pay

## 2021-08-14 ENCOUNTER — Other Ambulatory Visit: Payer: Self-pay

## 2021-08-14 ENCOUNTER — Emergency Department (HOSPITAL_COMMUNITY): Payer: Managed Care, Other (non HMO)

## 2021-08-14 ENCOUNTER — Emergency Department (HOSPITAL_COMMUNITY)
Admission: EM | Admit: 2021-08-14 | Discharge: 2021-08-14 | Disposition: A | Discharge status: Home / Self Care | Payer: Managed Care, Other (non HMO) | Attending: Emergency Medicine | Admitting: Emergency Medicine

## 2021-08-14 DIAGNOSIS — Z79899 Other long term (current) drug therapy: Secondary | ICD-10-CM | POA: Insufficient documentation

## 2021-08-14 DIAGNOSIS — R1012 Left upper quadrant pain: Secondary | ICD-10-CM | POA: Diagnosis present

## 2021-08-14 DIAGNOSIS — Z7901 Long term (current) use of anticoagulants: Secondary | ICD-10-CM | POA: Diagnosis not present

## 2021-08-14 DIAGNOSIS — D735 Infarction of spleen: Secondary | ICD-10-CM | POA: Diagnosis not present

## 2021-08-14 DIAGNOSIS — I82890 Acute embolism and thrombosis of other specified veins: Secondary | ICD-10-CM

## 2021-08-14 DIAGNOSIS — F17290 Nicotine dependence, other tobacco product, uncomplicated: Secondary | ICD-10-CM | POA: Diagnosis not present

## 2021-08-14 LAB — COMPREHENSIVE METABOLIC PANEL
ALT: 19 U/L (ref 0–44)
AST: 21 U/L (ref 15–41)
Albumin: 3.3 g/dL — ABNORMAL LOW (ref 3.5–5.0)
Alkaline Phosphatase: 116 U/L (ref 38–126)
Anion gap: 12 (ref 5–15)
BUN: 12 mg/dL (ref 6–20)
CO2: 27 mmol/L (ref 22–32)
Calcium: 9.2 mg/dL (ref 8.9–10.3)
Chloride: 94 mmol/L — ABNORMAL LOW (ref 98–111)
Creatinine, Ser: 0.89 mg/dL (ref 0.61–1.24)
GFR, Estimated: >60 mL/min (ref >60)
Glucose, Bld: 354 mg/dL — ABNORMAL HIGH (ref 70–99)
Potassium: 4.2 mmol/L (ref 3.5–5.1)
Sodium: 133 mmol/L — ABNORMAL LOW (ref 135–145)
Total Bilirubin: 1.5 mg/dL — ABNORMAL HIGH (ref 0.3–1.2)
Total Protein: 7.3 g/dL (ref 6.5–8.1)

## 2021-08-14 LAB — CBC WITH DIFFERENTIAL/PLATELET
Abs Immature Granulocytes: 0.05 10*3/uL (ref 0.00–0.07)
Basophils Absolute: 0 10*3/uL (ref 0.0–0.1)
Basophils Relative: 0 %
Eosinophils Absolute: 0 10*3/uL (ref 0.0–0.5)
Eosinophils Relative: 0 %
HCT: 47.8 % (ref 39.0–52.0)
Hemoglobin: 16.5 g/dL (ref 13.0–17.0)
Immature Granulocytes: 1 %
Lymphocytes Relative: 9 %
Lymphs Abs: 0.9 10*3/uL (ref 0.7–4.0)
MCH: 36 pg — ABNORMAL HIGH (ref 26.0–34.0)
MCHC: 34.5 g/dL (ref 30.0–36.0)
MCV: 104.4 fL — ABNORMAL HIGH (ref 80.0–100.0)
Monocytes Absolute: 1.6 10*3/uL — ABNORMAL HIGH (ref 0.1–1.0)
Monocytes Relative: 16 %
Neutro Abs: 7.3 10*3/uL (ref 1.7–7.7)
Neutrophils Relative %: 74 %
Platelets: 295 10*3/uL (ref 150–400)
RBC: 4.58 MIL/uL (ref 4.22–5.81)
RDW: 12.5 % (ref 11.5–15.5)
WBC: 9.8 10*3/uL (ref 4.0–10.5)
nRBC: 0 % (ref 0.0–0.2)

## 2021-08-14 LAB — URINALYSIS, ROUTINE W REFLEX MICROSCOPIC
Bilirubin Urine: NEGATIVE
Glucose, UA: >=500 mg/dL — AB
Hgb urine dipstick: NEGATIVE
Ketones, ur: 20 mg/dL — AB
Leukocytes,Ua: NEGATIVE
Nitrite: NEGATIVE
Protein, ur: NEGATIVE mg/dL
Specific Gravity, Urine: 1.025 (ref 1.005–1.030)
pH: 5 (ref 5.0–8.0)

## 2021-08-14 LAB — ETHANOL: Alcohol, Ethyl (B): <10 mg/dL (ref <10)

## 2021-08-14 LAB — LIPASE, BLOOD: Lipase: 63 U/L — ABNORMAL HIGH (ref 11–51)

## 2021-08-14 MED ORDER — APIXABAN 5 MG PO TABS
10.0000 mg | ORAL_TABLET | Freq: Once | ORAL | Status: DC
  Filled 2021-08-14: qty 2

## 2021-08-14 MED ORDER — IOHEXOL 350 MG/ML SOLN
75.0000 mL | Freq: Once | INTRAVENOUS | Status: AC | PRN
  Administered 2021-08-14: 75 mL via INTRAVENOUS

## 2021-08-14 MED ORDER — APIXABAN 5 MG PO TABS: ORAL_TABLET | ORAL | 0 refills | Status: DC

## 2021-08-14 MED ORDER — SODIUM CHLORIDE 0.9 % IV SOLN: INTRAVENOUS | Status: DC

## 2021-08-14 MED ORDER — SODIUM CHLORIDE (PF) 0.9 % IJ SOLN
INTRAMUSCULAR | Status: AC
  Filled 2021-08-14: qty 50

## 2021-08-14 MED ORDER — ONDANSETRON HCL 4 MG/2ML IJ SOLN
4.0000 mg | Freq: Once | INTRAMUSCULAR | Status: AC
  Administered 2021-08-14: 4 mg via INTRAVENOUS
  Filled 2021-08-14: qty 2

## 2021-08-14 MED ORDER — ALUM & MAG HYDROXIDE-SIMETH 200-200-20 MG/5ML PO SUSP
30.0000 mL | Freq: Once | ORAL | Status: AC
  Administered 2021-08-14: 30 mL via ORAL
  Filled 2021-08-14: qty 30

## 2021-08-14 MED ORDER — FAMOTIDINE IN NACL 20-0.9 MG/50ML-% IV SOLN
20.0000 mg | Freq: Once | INTRAVENOUS | Status: AC
  Administered 2021-08-14: 20 mg via INTRAVENOUS
  Filled 2021-08-14: qty 50

## 2021-08-14 MED ORDER — FENTANYL CITRATE PF 50 MCG/ML IJ SOSY
50.0000 ug | PREFILLED_SYRINGE | Freq: Once | INTRAMUSCULAR | Status: AC
Start: 2021-08-14 — End: 2021-08-14
  Administered 2021-08-14: 50 ug via INTRAVENOUS
  Filled 2021-08-14: qty 1

## 2021-08-14 NOTE — ED Triage Notes (Signed)
Pt arrived via POV, c/o LUQ abd pain, states hx of pancreatitis, feels similar.

## 2021-08-14 NOTE — ED Notes (Signed)
Patient left before eliquis could be given

## 2021-08-14 NOTE — ED Provider Notes (Signed)
[name] Provider Note   CSN: [id number] Arrival date & time: 08/14/21  9794     History Chief Complaint  Patient presents with   Abdominal Pain    Carlos [name] is a 35 y.o. male.  HPI Patient presents with epigastric and left upper quadrant abdominal pain.  Onset was subtle a few days ago.  No obvious precipitant.  Since onset pain has become worse, more persistent, is moderate, nonradiating, throughout the upper abdomen, left greater than right.  Associated nausea, no vomiting, no diarrhea, no fever. Minimal relief with ibuprofen. He notes that he smokes Black and milds, does not drink. He has 1 prior episode of pancreatitis, states this feels somewhat similar.  He has no history of abdominal surgery, was well prior to this episode.    History reviewed. No pertinent past medical history.  Patient Active Problem List   Diagnosis Date Noted   History of ETT    Hyperglycemia 09/27/2020   Hyponatremia 09/27/2020   Transaminitis 09/27/2020   Severe sepsis (HCC) 09/27/2020   Alcohol withdrawal delirium (HCC)    Acute alcoholic pancreatitis 09/26/2020   Alcohol dependence syndrome (HCC) 09/26/2020   Tobacco dependence 09/26/2020    History reviewed. No pertinent surgical history.     Family History  Problem Relation Age of Onset   Hypertension Mother    Diabetes Maternal Grandmother    Diabetes Maternal Grandfather     Social History   Tobacco Use   Smoking status: Every Day    Types: Cigars    Start date: 11/08/2002   Smokeless tobacco: Never   Tobacco comments:    Black and Mild- 2-3 a day, Started at age 26   Vaping Use   Vaping Use: Never used  Substance Use Topics   Alcohol use: Yes    Comment: 8 beers daily   Drug use: Never    Home Medications Prior to Admission medications   Medication Sig Start Date End Date Taking? Authorizing Provider  apixaban (ELIQUIS) 5 MG TABS tablet Take 2 tablets (10mg ) twice daily for 7  days, then 1 tablet (5mg ) twice daily 08/14/21  Yes , MD  ibuprofen (ADVIL) 200 MG tablet Take 400 mg by mouth every 6 (six) hours as needed for mild pain.   Yes [provider]  Multiple Vitamins-Minerals (CENTRUM) tablet Take 1 tablet by mouth daily.   Yes [provider]    Allergies    Other  Review of Systems   Review of Systems  Constitutional:        Per HPI, otherwise negative  HENT:         Per HPI, otherwise negative  Respiratory:         Per HPI, otherwise negative  Cardiovascular:        Per HPI, otherwise negative  Gastrointestinal:  Positive for abdominal pain and vomiting.  Endocrine:       Negative aside from HPI  Genitourinary:        Neg aside from HPI   Musculoskeletal:        Per HPI, otherwise negative  Skin: Negative.   Neurological:  Negative for syncope.   Physical Exam Updated Vital Signs BP (!) 127/91   Pulse 74   Temp 98.6 F (37 C) (Oral)   Resp 19   SpO2 100%   Physical Exam Vitals and nursing note reviewed.  Constitutional:      General: He is not in acute distress.  Appearance: He is well-developed.  HENT:     Head: Normocephalic and atraumatic.  Eyes:     Conjunctiva/sclera: Conjunctivae normal.  Cardiovascular:     Rate and Rhythm: Normal rate and regular rhythm.  Pulmonary:     Effort: Pulmonary effort is normal. No respiratory distress.     Breath sounds: No stridor.  Abdominal:     General: There is no distension.     Tenderness: There is abdominal tenderness in the epigastric area and left upper quadrant.  Skin:    General: Skin is warm and dry.  Neurological:     Mental Status: He is alert and oriented to person, place, and time.    ED Results / Procedures / Treatments   Labs (all labs ordered are listed, but only abnormal results are displayed) Labs Reviewed  COMPREHENSIVE METABOLIC PANEL - Abnormal; Notable for the following components:      Result Value   Sodium 133 (*)     Chloride 94 (*)    Glucose, Bld 354 (*)    Albumin 3.3 (*)    Total Bilirubin 1.5 (*)    All other components within normal limits  LIPASE, BLOOD - Abnormal; Notable for the following components:   Lipase 63 (*)    All other components within normal limits  CBC WITH DIFFERENTIAL/PLATELET - Abnormal; Notable for the following components:   MCV 104.4 (*)    MCH 36.0 (*)    Monocytes Absolute 1.6 (*)    All other components within normal limits  URINALYSIS, ROUTINE W REFLEX MICROSCOPIC - Abnormal; Notable for the following components:   Glucose, UA >=500 (*)    Ketones, ur 20 (*)    Bacteria, UA RARE (*)    All other components within normal limits  ETHANOL    EKG None  Radiology CT ABDOMEN PELVIS W CONTRAST  Result Date: 08/14/2021 CLINICAL DATA:  Left upper quadrant abdominal pain, history of pancreatitis EXAM: CT ABDOMEN AND PELVIS WITH CONTRAST TECHNIQUE: Multidetector CT imaging of the abdomen and pelvis was performed using the standard protocol following bolus administration of intravenous contrast. CONTRAST:  63mL OMNIPAQUE IOHEXOL 350 MG/ML SOLN COMPARISON:  09/28/2020 FINDINGS: Lower chest: No pleural or pericardial effusion. Visualized lung bases clear. Hepatobiliary: No focal liver abnormality is seen. No gallstones, gallbladder wall thickening, or biliary dilatation. Pancreas: The previously demonstrated peripancreatic fluid has resolved. Interval development of well-marginated pseudocyst cephalad to the pancreatic tail, abutting the gastric cardia; and a 7.2 cm pseudocyst inferior to the pancreatic head. Scattered coarse pancreatic parenchymal calcifications in the head and neck region. No discrete mass or ductal dilatation. Spleen: Normal in size without focal abnormality. 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 Adrenals/Urinary Tract: Adrenal glands are unremarkable. Kidneys are normal, without renal calculi, focal lesion, or hydronephrosis.  Bladder is unremarkable. Stomach/Bowel: Stomach is incompletely distended, unremarkable. The small bowel is decompressed. Appendix not discretely identified. The colon is nondilated, unremarkable. 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 Portal vein remains patent. No adenopathy localized. Reproductive: Prostate is unremarkable. Other: No ascites.  No free air. Musculoskeletal: No acute or significant osseous findings. IMPRESSION: 1. No acute findings. 2. Interval maturation of large pancreatic pseudocysts as  above. 3. 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 Electronically Signed   By: Corlis Leak M.D.   On: 08/14/2021 16:06    Procedures Procedures   Medications Ordered in ED Medications  0.9 %  sodium chloride infusion (  Intravenous New Bag/Given 08/14/21 1445)  sodium chloride (PF) 0.9 % injection (has no administration in time range)  fentaNYL (SUBLIMAZE) injection 50 mcg (50 mcg Intravenous Given 08/14/21 1442)  ondansetron (ZOFRAN) injection 4 mg (4 mg Intravenous Given 08/14/21 1442)  alum & mag hydroxide-simeth (MAALOX/MYLANTA) 200-200-20 MG/5ML suspension 30 mL (30 mLs Oral Given 08/14/21 0926)  famotidine (PEPCID) IVPB 20 mg premix (20 mg Intravenous New Bag/Given 08/14/21 1446)  iohexol (OMNIPAQUE) 350 MG/ML injection 75 mL (75 mLs Intravenous Contrast Given 08/14/21 1522)    ED Course  I have reviewed the triage vital signs and the nursing notes.  Pertinent labs & imaging results that were available during my care of the patient were reviewed by me and considered in my medical decision making (see chart for details).  Update: Patient in no distress, similar condition.  Results thus far discussed.   4:33 PM I reviewed the patient's CT scan, discussed with him at length.  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  He and I had  a lengthy conversation about this, implications, indication for anticoagulation versus follow-up with GI.  Patient will start anticoagulation here, will follow-up with GI as an outpatient.  Without other acute findings, patient appropriate for ongoing outpatient management. MDM Rules/Calculators/A&P MDM Number of Diagnoses or Management Options Splenic vein thrombosis: new, needed workup   Amount and/or Complexity of Data Reviewed Clinical lab tests: ordered and reviewed Tests in the radiology section of CPT: ordered and reviewed Tests in the medicine section of CPT: reviewed and ordered Decide to obtain previous medical records or to obtain history from someone other than the patient: yes Review and summarize past medical records: yes Independent visualization of images, tracings, or specimens: yes  Risk of Complications, Morbidity, and/or Mortality Presenting problems: high Diagnostic procedures: high Management options: high  Critical Care Total time providing critical care: < 30 minutes  Patient Progress Patient progress: stable   Final Clinical Impression(s) / ED Diagnoses Final diagnoses:  Splenic vein thrombosis    Rx / DC Orders ED Discharge Orders          Ordered    apixaban (ELIQUIS) 5 MG TABS tablet        08/14/21 1632             Gerhard Munch, MD 08/14/21 1634

## 2021-08-14 NOTE — Discharge Instructions (Addendum)
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  You are starting a new medication.  It is importantly follow-up with our gastroenterology colleagues for appropriate ongoing management.  Information on my medicine - ELIQUIS (apixaban)  This medication education was reviewed with me or my healthcare representative as part of my discharge preparation.  The pharmacist that spoke with me during my hospital stay was:  Lagena Strand A, RPH  Why was Eliquis prescribed for you? Eliquis was prescribed to treat blood clots that may have been found in the veins of your spleen and to reduce the risk of them occurring again.  What do You need to know about Eliquis ? The starting dose is 10 mg (two 5 mg tablets) taken TWICE daily for the FIRST SEVEN (7) DAYS, then on (enter date)  08/21/21  the dose is reduced to ONE 5 mg tablet taken TWICE daily.  Eliquis may be taken with or without food.   Try to take the dose about the same time in the morning and in the evening. If you have difficulty swallowing the tablet whole please discuss with your pharmacist how to take the medication safely.  Take Eliquis exactly as prescribed and DO NOT stop taking Eliquis without talking to the doctor who prescribed the medication.  Stopping may increase your risk of developing a new blood clot.  Refill your prescription before you run out.  After discharge, you should have regular check-up appointments with your healthcare provider that is prescribing your Eliquis.    What do you do if you miss a dose? If a dose of ELIQUIS is not taken at the scheduled time, take it as soon as possible on the same day and twice-daily administration should be resumed. The dose should not be doubled to make up for a missed dose.  Important Safety Information A possible side effect of Eliquis is bleeding. You should call your healthcare  provider right away if you experience any of the following: Bleeding from an injury or your nose that does not stop. Unusual colored urine (red or dark brown) or unusual colored stools (red or black). Unusual bruising for unknown reasons. A serious fall or if you hit your head (even if there is no bleeding).  Some medicines may interact with Eliquis and might increase your risk of bleeding or clotting while on Eliquis. To help avoid this, consult your healthcare provider or pharmacist prior to using any new prescription or non-prescription medications, including herbals, vitamins, non-steroidal anti-inflammatory drugs (NSAIDs) and supplements.  This website has more information on Eliquis (apixaban): http://www.eliquis.com/eliquis/home

## 2021-08-20 ENCOUNTER — Ambulatory Visit (INDEPENDENT_AMBULATORY_CARE_PROVIDER_SITE_OTHER): Payer: Managed Care, Other (non HMO) | Admitting: Gastroenterology

## 2021-08-20 ENCOUNTER — Encounter: Payer: Self-pay | Admitting: Gastroenterology

## 2021-08-20 VITALS — BP 112/68 | HR 100 | Ht 75.0 in | Wt 139.0 lb

## 2021-08-20 DIAGNOSIS — K863 Pseudocyst of pancreas: Secondary | ICD-10-CM

## 2021-08-20 DIAGNOSIS — I82890 Acute embolism and thrombosis of other specified veins: Secondary | ICD-10-CM | POA: Diagnosis not present

## 2021-08-20 DIAGNOSIS — G8929 Other chronic pain: Secondary | ICD-10-CM

## 2021-08-20 DIAGNOSIS — R1013 Epigastric pain: Secondary | ICD-10-CM

## 2021-08-20 DIAGNOSIS — Z8719 Personal history of other diseases of the digestive system: Secondary | ICD-10-CM | POA: Diagnosis not present

## 2021-08-20 DIAGNOSIS — R935 Abnormal findings on diagnostic imaging of other abdominal regions, including retroperitoneum: Secondary | ICD-10-CM

## 2021-08-20 DIAGNOSIS — R6881 Early satiety: Secondary | ICD-10-CM

## 2021-08-20 DIAGNOSIS — K862 Cyst of pancreas: Secondary | ICD-10-CM

## 2021-08-20 DIAGNOSIS — Z7901 Long term (current) use of anticoagulants: Secondary | ICD-10-CM

## 2021-08-20 MED ORDER — TRAMADOL HCL 50 MG PO TABS: 25.0000 mg | ORAL_TABLET | Freq: Two times a day (BID) | ORAL | 1 refills | Status: DC | PRN

## 2021-08-20 NOTE — Patient Instructions (Addendum)
Trial of Creon : Take 1-2 capsules with each meal daily. (Samples given to you today.)  You have been referred to [facility]) - Their office will contact you to schedule. If you have not heard from them in 1-2 weeks, please contact them at: [phone]  You have been referred to [facility] and Hematology -Dr Leonides Schanz. If you have not heard from their office in 1-2 weeks, please contact them at : [phone]  We have sent the following medications to your pharmacy for you to pick up at your convenience: Tramadol   Send my chart message in 1-2 weeks letting us know how your doing.   Please keep follow up on: 10/09/21@ 2:10pm with Dr [name].    If you are age 19 or younger, your body mass index should be between 19-25. Your Body mass index is 17.37 kg/m. If this is out of the aformentioned range listed, please consider follow up with your Primary Care Provider.   __________________________________________________________  The [facility] providers would like to encourage you to use [facility] to communicate with providers for non-urgent requests or questions.  Due to long hold times on the telephone, sending your provider a message by [facility] may be a faster and more efficient way to get a response.  Please allow 48 business hours for a response.  Please remember that this is for non-urgent requests.   Thank you for choosing me and [facility].  Dr. [name]

## 2021-08-20 NOTE — Progress Notes (Signed)
GASTROENTEROLOGY OUTPATIENT CLINIC VISIT   Primary Care Provider System, Provider Not In No address on file None  Referring Provider Emergency department physicians  Patient Profile: Carlos [name] is a 35 y.o. 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    The patient presents to the [facility] for an evaluation and management of problem(s) noted below:  Problem List 1. Abdominal pain, chronic, epigastric   2. History of pancreatitis   3. Pseudocyst of pancreas   4. Splenic vein thrombosis   5. Chronic anticoagulation   6. Abnormal CT of the abdomen   7. Early satiety     History of Present Illness This is the patient's first visit to the outpatient [facility] clinic.  The patient was admitted to the hospital in November 2021 with pancreatitis and was in the hospital for over 2 weeks including an ICU hospital stay.  He was evaluated by [facility] gastroenterology and they were to set up follow-up in the GI clinic.  It is not clear if the patient ever received notice of this but in either case he never followed up with gastroenterology.  Over the course of the last year however he has been experiencing chronic abdominal pain.  This pain is a 5-6 out of 10 and is sharp and stabbing at times and located in the upper abdomen and epigastrium.  He will have periods of exacerbation at times.  Most recently last week, he had a significant episode of discomfort that led him to the hospital.  He went to the emergency department and had cross-sectional imaging as noted below.  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  The ED provider initiated him on anticoagulation even though no clear timeline of when this splenic vein thrombosis occurred.  The patient does not have a primary care provider but was given  information to try and set up a GI referral and that is the reason that the patient is seen today.  Patient did lose 25 pounds during the hospital stay and right after but he has been able to maintain his weight but not gain.  He does have some symptoms of early satiety so he tries to eat smaller meals more frequently but just cannot gain any extra weight.  The patient does notice that if he is leaning forward at times that he can feel a sensation of fullness in his upper abdomen.  He does not drink alcohol anymore.  The previous alcohol consumption was at least 2-3 spirits per day for years as well as tobacco use.  They did not feel during his hospitalization that cholelithiasis or biliary pathology was the issue at play.  The patient does take ibuprofen to try to help with his pain but as he works at nights he does not take any other significant pain medications on a regular basis.  He has never had an upper or lower endoscopy.  He is taking his apixaban as outlined by the ED provider and is currently looking for a primary care provider.  He hopes that we can get him to feel better.  GI Review of Systems Positive as above Negative for pyrosis, dysphagia, odynophagia, vomiting, alteration of bowel habits, melena, hematochezia   Review of Systems General: Denies fevers/chills HEENT: Denies oral lesions/sore throat Cardiovascular: Denies chest pain/palpitations Pulmonary: Denies shortness of breath Gastroenterological: See HPI Genitourinary:  Denies darkened urine Hematological: Denies easy bruising/bleeding Dermatological: Denies skin changes Psychological: Mood is stable   Medications Current Outpatient Medications  Medication Sig Dispense Refill   apixaban (ELIQUIS) 5 MG TABS tablet Take 2 tablets (10mg ) twice daily for 7 days, then 1 tablet (5mg ) twice daily 60 tablet 0   ibuprofen (ADVIL) 200 MG tablet Take 400 mg by mouth every 6 (six) hours as needed for mild pain.     Multiple  Vitamins-Minerals (CENTRUM) tablet Take 1 tablet by mouth daily.     traMADol (ULTRAM) 50 MG tablet Take 0.5 tablets (25 mg total) by mouth every 12 (twelve) hours as needed for severe pain. 30 tablet 1   No current facility-administered medications for this visit.    Allergies Allergies  Allergen Reactions   Other Anaphylaxis    boysenberry    Histories Past Medical History:  Diagnosis Date   Anxiety    Pancreatitis    Past Surgical History:  Procedure Laterality Date   NO PAST SURGERIES     Social History   Socioeconomic History   Marital status: Single    Spouse name: Not on file   Number of children: 4   Years of education: Not on file   Highest education level: Not on file  Occupational History   Occupation: forklift driver  Tobacco Use   Smoking status: Every Day    Types: Cigars    Start date: 11/08/2002   Smokeless tobacco: Never   Tobacco comments:    Black and Mild- 2-3 a day, Started at age 31   Vaping Use   Vaping Use: Never used  Substance and Sexual Activity   Alcohol use: Yes    Comment: 1-2 per day   Drug use: Never   Sexual activity: Not on file  Other Topics Concern   Not on file  Social History Narrative   Not on file   Social Determinants of Health   Financial Resource Strain: Not on file  Food Insecurity: Not on file  Transportation Needs: Not on file  Physical Activity: Not on file  Stress: Not on file  Social Connections: Not on file  Intimate Partner Violence: Not on file   Family History  Problem Relation Age of Onset   Hypertension Mother    Alcoholism Maternal Uncle    Diabetes Maternal Grandmother    Diabetes Maternal Grandfather    Diabetes Paternal Grandmother    Diabetes Paternal Grandfather    Colon cancer Neg Hx    Esophageal cancer Neg Hx    Inflammatory bowel disease Neg Hx    Liver disease Neg Hx    Pancreatic cancer Neg Hx    Rectal cancer Neg Hx    Stomach cancer Neg Hx    I have reviewed his medical,  social, and family history in detail and updated the electronic medical record as necessary.    PHYSICAL EXAMINATION  BP 112/68 (BP Location: Left Arm, Patient Position: Sitting, Cuff Size: Normal)   Pulse 100   Ht 6\' 3"  (1.905 m)   Wt 139 lb (63 kg)   BMI 17.37 kg/m  Wt Readings from Last 3 Encounters:  08/20/21 139 lb (63 kg)  10/07/20 173 lb (78.5 kg)  08/08/19 182 lb (82.6 kg)  GEN: NAD, appears stated age, doesn't appear chronically ill PSYCH: Cooperative, without pressured speech EYE: Conjunctivae pink, sclerae anicteric ENT: MMM, without oral ulcers, no erythema or exudates noted NECK: Supple CV: RR without R/Gs  RESP: CTAB posteriorly, without  wheezing GI: NABS, soft, TTP in upper abdomen upon deep palpation, without rebound, no HSM appreciated MSK/EXT: No lower extremity edema SKIN: No jaundice, no spider angiomata NEURO:  Alert & Oriented x 3, no focal deficits   REVIEW OF DATA  I reviewed the following data at the time of this encounter:  GI Procedures and Studies  No relevant studies to review  Laboratory Studies  Reviewed those in epic  Imaging Studies  November 2021 CT abdomen pelvis IMPRESSION: 1. Severe interstitial edematous pancreatitis with early acute pancreatic collections. 2. Diminished enhancement throughout the gland not definitive at this time for signs of necrosis, perhaps due to severe pancreatic edema. Consider close follow-up based on clinical parameters to exclude the possibility of developing glandular necrosis. 3. Signs of ascites. 4. Small hiatal hernia and mild circumferential distal esophageal thickening. Correlate with any clinical signs of esophagitis. 5. Probable hepatic steatosis.  November 2021 right upper quadrant ultrasound IMPRESSION: 1. Trace free fluid.  Hepatic steatosis. 2. Otherwise negative right upper quadrant  November 2021 CT abdomen IMPRESSION: 1. Redemonstrated extensive inflammatory stranding about  the pancreas and adjacent structures, with an interval increase in retroperitoneal inflammatory fluid. 2. There is new hypoenhancement of the majority of the pancreatic body and tail, involving approximately the distal 9.2 cm of the pancreas. 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 3. There is a new fluid collection of the anterior pancreatic head measuring 3.2 x 2.9 cm, consistent with developing pseudocyst. 4. New small volume ascites. 5. New moderate bilateral pleural effusions and associated atelectasis or consolidation.  October 2022 CT abdomen pelvis with contrast IMPRESSION: 1. No acute findings. 2. Interval maturation of large pancreatic pseudocysts as  above. 3. 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   ASSESSMENT  Mr. Maus is a 35 y.o. 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  The patient is seen today for evaluation and management of:  1. Abdominal pain, chronic, epigastric   2. History of pancreatitis   3. Pseudocyst of pancreas   4. Splenic vein thrombosis   5. Chronic anticoagulation   6. Abnormal CT of the abdomen   7. Early satiety    The patient is hemodynamically stable.  Clinically however the patient has symptoms of 1 if not both of his pseudocyst that he is developed from his prior episode of pancreatitis.  The presumption last year was that this was alcohol induced although that does remain the most likely etiology based on his history I would expect that he would have had a higher degree of alcohol consumption at the time.  But that being said he remains abstinent of all alcohol but does continue to use tobacco products.  Unfortunately, at some point within the last year he has developed a splenic vein thrombosis.  The ED providers immediately placed him on  anticoagulation and he is on that currently.  In patients who have splenic vein thrombosis, it is not always clear if there are already venous collaterals whether improvement will occur with anticoagulation.  I would like our hematology colleagues to help me be guided as to how long anticoagulation therapy should be continued if at all in this patient.  The reason this is important is that I do think pseudocyst drainage would be ideal of at least the pseudocyst in the left upper quadrant and in but I cannot do that safely while  on anticoagulation.  This could require a few months of getting the patient through before we can stop his anticoagulation versus interval imaging that may help guide whether there is any role of continue anticoagulation or not.  Patient also needs to have establishment with a PCP and we will place a referral for that as well.  The risks of an EUS, including intestinal perforation, bleeding, infection, aspiration, and medication effects were discussed.  When a cystgastrostomy/cystenterostomy is performed as part of the EUS, there is an additional risk of pancreatitis at the rate of about 1-2%.  It was explained that procedure related pancreatitis is typically mild, although at times it can be severe and even life threatening.  All patient questions were answered to the best of my ability, and the patient agrees to the aforementioned plan of action with follow-up as indicated.   PLAN  Although I am not the prescribing provider for his Eliquis, since he has been initiated on it I recommend he remain on that until he is evaluated by hematology - Decision to be made based on how long to remain on Eliquis versus interval imaging to see if any benefit or not Consideration of cyst gastrostomy creation at some point when felt to be safe from an anticoagulation hold perspective or cough perspective Recommend Tylenol 1000 mg every 8 hour and alternate with ibuprofen 400 mg every 8 hour Short  course of tramadol to help patient with pain though long-term use of this medication will not likely be pursued Small meals more frequently during the day Complete alcohol abstinence Try to minimize tobacco use moving forward Trial Creon 1 to 2 capsules with each meal daily - If some clinical improvement then PERT prescription will be sent   Orders Placed This Encounter  Procedures   Ambulatory referral to Hematology / Oncology   Ambulatory referral to Internal Medicine    New Prescriptions   TRAMADOL (ULTRAM) 50 MG TABLET    Take 0.5 tablets (25 mg total) by mouth every 12 (twelve) hours as needed for severe pain.   Modified Medications   No medications on file    Planned Follow Up No follow-ups on file.   Total Time in Face-to-Face and in Coordination of Care for patient including independent/personal interpretation/review of prior testing, medical history, examination, medication adjustment, communicating results with the patient directly, reviewing his imaging as well as cyst gastrostomy creation information, and documentation within the EHR is 45 minutes.   [name], MD [facility] Advanced Endoscopy Office # [phone]

## 2021-08-22 ENCOUNTER — Encounter: Payer: Self-pay | Admitting: Gastroenterology

## 2021-08-22 DIAGNOSIS — Z8719 Personal history of other diseases of the digestive system: Secondary | ICD-10-CM | POA: Insufficient documentation

## 2021-08-22 DIAGNOSIS — K869 Disease of pancreas, unspecified: Secondary | ICD-10-CM | POA: Insufficient documentation

## 2021-08-22 DIAGNOSIS — Z7901 Long term (current) use of anticoagulants: Secondary | ICD-10-CM | POA: Insufficient documentation

## 2021-08-22 DIAGNOSIS — I82890 Acute embolism and thrombosis of other specified veins: Secondary | ICD-10-CM | POA: Insufficient documentation

## 2021-08-22 DIAGNOSIS — G8929 Other chronic pain: Secondary | ICD-10-CM | POA: Insufficient documentation

## 2021-08-22 DIAGNOSIS — K863 Pseudocyst of pancreas: Secondary | ICD-10-CM | POA: Insufficient documentation

## 2021-08-22 DIAGNOSIS — R6881 Early satiety: Secondary | ICD-10-CM | POA: Insufficient documentation

## 2021-08-22 DIAGNOSIS — R1013 Epigastric pain: Secondary | ICD-10-CM | POA: Insufficient documentation

## 2021-08-22 DIAGNOSIS — R935 Abnormal findings on diagnostic imaging of other abdominal regions, including retroperitoneum: Secondary | ICD-10-CM | POA: Insufficient documentation

## 2021-08-24 ENCOUNTER — Telehealth: Payer: Self-pay | Admitting: Internal Medicine

## 2021-08-24 NOTE — Telephone Encounter (Signed)
Scheduled appt per 10/13 referral. Pt is aware of appt date and time.  

## 2021-08-31 ENCOUNTER — Telehealth: Payer: Self-pay | Admitting: Gastroenterology

## 2021-08-31 MED ORDER — PANCRELIPASE (LIP-PROT-AMYL) 36000-114000 UNITS PO CPEP: ORAL_CAPSULE | ORAL | 11 refills | Status: DC

## 2021-08-31 NOTE — Telephone Encounter (Signed)
Prescription for Creon has been sent to [facility]. Information has also been sent to the Nurse [facility].   PA for Tramadol has been approved eff 08/31/21-03/01/2022 PA#[employer] [id number] [facility] has been informed.   Pt has been informed of above. Voiced understanding and will let me know if he has any further questions.

## 2021-08-31 NOTE — Telephone Encounter (Signed)
PA has been submitted to [facility]. Tried returning call to patient. Will try again after lunch.

## 2021-08-31 NOTE — Telephone Encounter (Signed)
Inbound call from patient with an update about creon. States it is working well for him and would like a prescription.   Also states the pharmacy is not giving full prescription of tramadol and wants input. Best contact number [phone]

## 2021-09-01 ENCOUNTER — Inpatient Hospital Stay: Payer: Managed Care, Other (non HMO)

## 2021-09-01 ENCOUNTER — Inpatient Hospital Stay: Payer: Managed Care, Other (non HMO) | Admitting: Internal Medicine

## 2021-09-08 ENCOUNTER — Inpatient Hospital Stay: Payer: Managed Care, Other (non HMO) | Attending: Internal Medicine | Admitting: Hematology and Oncology

## 2021-09-13 ENCOUNTER — Inpatient Hospital Stay (HOSPITAL_COMMUNITY)
Admission: EM | Admit: 2021-09-13 | Discharge: 2021-09-16 | DRG: 637 | Disposition: A | Discharge status: Home / Self Care | Payer: Managed Care, Other (non HMO) | Attending: Internal Medicine | Admitting: Internal Medicine

## 2021-09-13 ENCOUNTER — Emergency Department (HOSPITAL_COMMUNITY): Payer: Managed Care, Other (non HMO)

## 2021-09-13 DIAGNOSIS — E860 Dehydration: Secondary | ICD-10-CM | POA: Diagnosis present

## 2021-09-13 DIAGNOSIS — R188 Other ascites: Secondary | ICD-10-CM | POA: Diagnosis present

## 2021-09-13 DIAGNOSIS — R6510 Systemic inflammatory response syndrome (SIRS) of non-infectious origin without acute organ dysfunction: Secondary | ICD-10-CM | POA: Diagnosis present

## 2021-09-13 DIAGNOSIS — K859 Acute pancreatitis without necrosis or infection, unspecified: Secondary | ICD-10-CM | POA: Diagnosis not present

## 2021-09-13 DIAGNOSIS — Z8249 Family history of ischemic heart disease and other diseases of the circulatory system: Secondary | ICD-10-CM

## 2021-09-13 DIAGNOSIS — E1310 Other specified diabetes mellitus with ketoacidosis without coma: Secondary | ICD-10-CM | POA: Diagnosis not present

## 2021-09-13 DIAGNOSIS — F419 Anxiety disorder, unspecified: Secondary | ICD-10-CM | POA: Diagnosis present

## 2021-09-13 DIAGNOSIS — R7989 Other specified abnormal findings of blood chemistry: Secondary | ICD-10-CM | POA: Diagnosis not present

## 2021-09-13 DIAGNOSIS — E871 Hypo-osmolality and hyponatremia: Secondary | ICD-10-CM | POA: Diagnosis present

## 2021-09-13 DIAGNOSIS — F17290 Nicotine dependence, other tobacco product, uncomplicated: Secondary | ICD-10-CM | POA: Diagnosis present

## 2021-09-13 DIAGNOSIS — Z681 Body mass index (BMI) 19 or less, adult: Secondary | ICD-10-CM

## 2021-09-13 DIAGNOSIS — E43 Unspecified severe protein-calorie malnutrition: Secondary | ICD-10-CM | POA: Diagnosis not present

## 2021-09-13 DIAGNOSIS — Z23 Encounter for immunization: Secondary | ICD-10-CM

## 2021-09-13 DIAGNOSIS — K860 Alcohol-induced chronic pancreatitis: Secondary | ICD-10-CM

## 2021-09-13 DIAGNOSIS — F1020 Alcohol dependence, uncomplicated: Secondary | ICD-10-CM | POA: Diagnosis present

## 2021-09-13 DIAGNOSIS — E876 Hypokalemia: Secondary | ICD-10-CM | POA: Diagnosis present

## 2021-09-13 DIAGNOSIS — E1010 Type 1 diabetes mellitus with ketoacidosis without coma: Secondary | ICD-10-CM | POA: Diagnosis present

## 2021-09-13 DIAGNOSIS — Z20822 Contact with and (suspected) exposure to covid-19: Secondary | ICD-10-CM | POA: Diagnosis present

## 2021-09-13 DIAGNOSIS — R55 Syncope and collapse: Secondary | ICD-10-CM | POA: Diagnosis present

## 2021-09-13 DIAGNOSIS — Z833 Family history of diabetes mellitus: Secondary | ICD-10-CM | POA: Diagnosis not present

## 2021-09-13 DIAGNOSIS — R7401 Elevation of levels of liver transaminase levels: Secondary | ICD-10-CM | POA: Diagnosis not present

## 2021-09-13 DIAGNOSIS — K863 Pseudocyst of pancreas: Secondary | ICD-10-CM | POA: Diagnosis not present

## 2021-09-13 DIAGNOSIS — Z7901 Long term (current) use of anticoagulants: Secondary | ICD-10-CM

## 2021-09-13 DIAGNOSIS — I951 Orthostatic hypotension: Secondary | ICD-10-CM | POA: Diagnosis present

## 2021-09-13 DIAGNOSIS — R1084 Generalized abdominal pain: Secondary | ICD-10-CM | POA: Diagnosis not present

## 2021-09-13 DIAGNOSIS — E0810 Diabetes mellitus due to underlying condition with ketoacidosis without coma: Principal | ICD-10-CM

## 2021-09-13 DIAGNOSIS — E1110 Type 2 diabetes mellitus with ketoacidosis without coma: Secondary | ICD-10-CM | POA: Diagnosis not present

## 2021-09-13 DIAGNOSIS — I82890 Acute embolism and thrombosis of other specified veins: Secondary | ICD-10-CM | POA: Diagnosis present

## 2021-09-13 DIAGNOSIS — Z79899 Other long term (current) drug therapy: Secondary | ICD-10-CM | POA: Diagnosis not present

## 2021-09-13 HISTORY — DX: Acute embolism and thrombosis of other specified veins: I82.890

## 2021-09-13 LAB — BASIC METABOLIC PANEL WITH GFR
Anion gap: 11 (ref 5–15)
BUN: 15 mg/dL (ref 6–20)
CO2: 23 mmol/L (ref 22–32)
Calcium: 8.9 mg/dL (ref 8.9–10.3)
Chloride: 95 mmol/L — ABNORMAL LOW (ref 98–111)
Creatinine, Ser: 0.67 mg/dL (ref 0.61–1.24)
GFR, Estimated: >60 mL/min
Glucose, Bld: 220 mg/dL — ABNORMAL HIGH (ref 70–99)
Potassium: 3.8 mmol/L (ref 3.5–5.1)
Sodium: 129 mmol/L — ABNORMAL LOW (ref 135–145)

## 2021-09-13 LAB — CBC WITH DIFFERENTIAL/PLATELET
Abs Immature Granulocytes: 0.04 10*3/uL (ref 0.00–0.07)
Basophils Absolute: 0 10*3/uL (ref 0.0–0.1)
Basophils Relative: 0 %
Eosinophils Absolute: 0 10*3/uL (ref 0.0–0.5)
Eosinophils Relative: 0 %
HCT: 47.4 % (ref 39.0–52.0)
Hemoglobin: 16.2 g/dL (ref 13.0–17.0)
Immature Granulocytes: 0 %
Lymphocytes Relative: 9 %
Lymphs Abs: 0.8 10*3/uL (ref 0.7–4.0)
MCH: 34.8 pg — ABNORMAL HIGH (ref 26.0–34.0)
MCHC: 34.2 g/dL (ref 30.0–36.0)
MCV: 101.9 fL — ABNORMAL HIGH (ref 80.0–100.0)
Monocytes Absolute: 0.4 10*3/uL (ref 0.1–1.0)
Monocytes Relative: 5 %
Neutro Abs: 7.8 10*3/uL — ABNORMAL HIGH (ref 1.7–7.7)
Neutrophils Relative %: 86 %
Platelets: 400 10*3/uL (ref 150–400)
RBC: 4.65 MIL/uL (ref 4.22–5.81)
RDW: 12 % (ref 11.5–15.5)
WBC: 9.1 10*3/uL (ref 4.0–10.5)
nRBC: 0 % (ref 0.0–0.2)

## 2021-09-13 LAB — RAPID URINE DRUG SCREEN, HOSP PERFORMED
Amphetamines: NOT DETECTED
Barbiturates: NOT DETECTED
Benzodiazepines: NOT DETECTED
Cocaine: NOT DETECTED
Opiates: NOT DETECTED
Tetrahydrocannabinol: NOT DETECTED

## 2021-09-13 LAB — HEPARIN LEVEL (UNFRACTIONATED): Heparin Unfractionated: <0.1 IU/mL — ABNORMAL LOW (ref 0.30–0.70)

## 2021-09-13 LAB — BLOOD GAS, VENOUS
Acid-base deficit: 13.8 mmol/L — ABNORMAL HIGH (ref 0.0–2.0)
Bicarbonate: 13.7 mmol/L — ABNORMAL LOW (ref 20.0–28.0)
O2 Saturation: 45.9 %
Patient temperature: 98.6
pCO2, Ven: 37.3 mmHg — ABNORMAL LOW (ref 44.0–60.0)
pH, Ven: 7.192 — CL (ref 7.250–7.430)
pO2, Ven: 32.8 mmHg (ref 32.0–45.0)

## 2021-09-13 LAB — URINALYSIS, ROUTINE W REFLEX MICROSCOPIC
Bilirubin Urine: NEGATIVE
Glucose, UA: >=500 mg/dL — AB
Hgb urine dipstick: NEGATIVE
Ketones, ur: 80 mg/dL — AB
Leukocytes,Ua: NEGATIVE
Nitrite: NEGATIVE
Protein, ur: NEGATIVE mg/dL
Specific Gravity, Urine: 1.028 (ref 1.005–1.030)
pH: 5 (ref 5.0–8.0)

## 2021-09-13 LAB — RESP PANEL BY RT-PCR (FLU A&B, COVID) ARPGX2
Influenza A by PCR: NEGATIVE
Influenza B by PCR: NEGATIVE
SARS Coronavirus 2 by RT PCR: NEGATIVE

## 2021-09-13 LAB — COMPREHENSIVE METABOLIC PANEL
ALT: 45 U/L — ABNORMAL HIGH (ref 0–44)
AST: 21 U/L (ref 15–41)
Albumin: 4.5 g/dL (ref 3.5–5.0)
Alkaline Phosphatase: 331 U/L — ABNORMAL HIGH (ref 38–126)
Anion gap: 27 — ABNORMAL HIGH (ref 5–15)
BUN: 20 mg/dL (ref 6–20)
CO2: 11 mmol/L — ABNORMAL LOW (ref 22–32)
Calcium: 9.4 mg/dL (ref 8.9–10.3)
Chloride: 87 mmol/L — ABNORMAL LOW (ref 98–111)
Creatinine, Ser: 1.24 mg/dL (ref 0.61–1.24)
GFR, Estimated: >60 mL/min (ref >60)
Glucose, Bld: 715 mg/dL (ref 70–99)
Potassium: 4.3 mmol/L (ref 3.5–5.1)
Sodium: 125 mmol/L — ABNORMAL LOW (ref 135–145)
Total Bilirubin: 2.3 mg/dL — ABNORMAL HIGH (ref 0.3–1.2)
Total Protein: 8.9 g/dL — ABNORMAL HIGH (ref 6.5–8.1)

## 2021-09-13 LAB — BASIC METABOLIC PANEL
Anion gap: 14 (ref 5–15)
BUN: 15 mg/dL (ref 6–20)
CO2: 18 mmol/L — ABNORMAL LOW (ref 22–32)
Calcium: 8.5 mg/dL — ABNORMAL LOW (ref 8.9–10.3)
Chloride: 97 mmol/L — ABNORMAL LOW (ref 98–111)
Creatinine, Ser: 0.69 mg/dL (ref 0.61–1.24)
GFR, Estimated: >60 mL/min (ref >60)
Glucose, Bld: 174 mg/dL — ABNORMAL HIGH (ref 70–99)
Potassium: 3.5 mmol/L (ref 3.5–5.1)
Sodium: 129 mmol/L — ABNORMAL LOW (ref 135–145)

## 2021-09-13 LAB — CBG MONITORING, ED
Glucose-Capillary: 596 mg/dL (ref 70–99)
Glucose-Capillary: >600 mg/dL (ref 70–99)
Glucose-Capillary: 494 mg/dL — ABNORMAL HIGH (ref 70–99)
Glucose-Capillary: 579 mg/dL (ref 70–99)
Glucose-Capillary: 394 mg/dL — ABNORMAL HIGH (ref 70–99)
Glucose-Capillary: 246 mg/dL — ABNORMAL HIGH (ref 70–99)
Glucose-Capillary: 222 mg/dL — ABNORMAL HIGH (ref 70–99)
Glucose-Capillary: 192 mg/dL — ABNORMAL HIGH (ref 70–99)
Glucose-Capillary: 269 mg/dL — ABNORMAL HIGH (ref 70–99)
Glucose-Capillary: 185 mg/dL — ABNORMAL HIGH (ref 70–99)

## 2021-09-13 LAB — APTT: aPTT: 30 seconds (ref 24–36)

## 2021-09-13 LAB — BETA-HYDROXYBUTYRIC ACID
Beta-Hydroxybutyric Acid: 3.35 mmol/L — ABNORMAL HIGH (ref 0.05–0.27)
Beta-Hydroxybutyric Acid: 2.62 mmol/L — ABNORMAL HIGH (ref 0.05–0.27)

## 2021-09-13 LAB — LIPASE, BLOOD: Lipase: 122 U/L — ABNORMAL HIGH (ref 11–51)

## 2021-09-13 MED ORDER — MORPHINE SULFATE (PF) 4 MG/ML IV SOLN
8.0000 mg | Freq: Once | INTRAVENOUS | Status: AC
  Administered 2021-09-13: 8 mg via INTRAVENOUS
  Filled 2021-09-13: qty 2

## 2021-09-13 MED ORDER — DEXTROSE IN LACTATED RINGERS 5 % IV SOLN: INTRAVENOUS | Status: DC

## 2021-09-13 MED ORDER — LACTATED RINGERS IV BOLUS
1000.0000 mL | INTRAVENOUS | Status: AC
  Administered 2021-09-13: 1000 mL via INTRAVENOUS

## 2021-09-13 MED ORDER — INSULIN REGULAR(HUMAN) IN NACL 100-0.9 UT/100ML-% IV SOLN
INTRAVENOUS | Status: DC
  Administered 2021-09-13: 11 [IU]/h via INTRAVENOUS
  Filled 2021-09-13: qty 100

## 2021-09-13 MED ORDER — DEXTROSE 50 % IV SOLN: 0.0000 mL | INTRAVENOUS | Status: DC | PRN

## 2021-09-13 MED ORDER — HEPARIN (PORCINE) 25000 UT/250ML-% IV SOLN
1200.0000 [IU]/h | INTRAVENOUS | Status: DC
  Administered 2021-09-13: 1000 [IU]/h via INTRAVENOUS
  Filled 2021-09-13: qty 250

## 2021-09-13 MED ORDER — POTASSIUM CHLORIDE 10 MEQ/100ML IV SOLN
10.0000 meq | INTRAVENOUS | Status: AC
  Administered 2021-09-13 (×2): 10 meq via INTRAVENOUS
  Filled 2021-09-13 (×2): qty 100

## 2021-09-13 MED ORDER — HEPARIN BOLUS VIA INFUSION
3800.0000 [IU] | Freq: Once | INTRAVENOUS | Status: AC
  Administered 2021-09-13: 3800 [IU] via INTRAVENOUS
  Filled 2021-09-13: qty 3800

## 2021-09-13 MED ORDER — INSULIN REGULAR(HUMAN) IN NACL 100-0.9 UT/100ML-% IV SOLN
INTRAVENOUS | Status: DC
  Administered 2021-09-13: 15 [IU]/h via INTRAVENOUS

## 2021-09-13 MED ORDER — LACTATED RINGERS IV SOLN: INTRAVENOUS | Status: DC

## 2021-09-13 NOTE — H&P (Signed)
History and Physical    Wasim Hurlbut EPP:[id number] DOB: 1986-10-08 DOA: 09/13/2021  PCP: Pcp, No   Patient coming from: Home  Chief Complaint: Passing out and fainting; Abdominal Pain   HPI: Carlos [name] is a 35 y.o. 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  Patient presented to the ED with a chief complaint of abdominal pain, passing out and some mild shortness of breath.  He states he started having left-sided abdominal pain intermittent for last 2 days but worsened.  He has a history of pancreatitis that feels similar in the past.  He reports he had a syncopal episode when he tried to get out of bed and states when he stood up he fell but did not hit his head.  He denies any chest pain but did endorse feeling shortness of breath for last 2 days which is now improved.  Because of his fall, his significant other brought him to the ED for further evaluation And found to have a significantly elevated blood glucose and hyponatremia.  Patient's lipase level was also elevated and he was hyperbilirubinemic.  He endorses weight loss of 17 to 19 pounds in last few weeks, polyuria, polydipsia and had some epigastric right upper quadrant abdominal pain with nausea which is now improving.  TRH was asked admit this patient for hyperglycemia in the setting of DKA with a syncopal episode.  ED Course: In The ED he was given IV fluid hydration and given 2 L of boluses and then placed on a insulin drip and then maintenance IV fluids.  He basic blood work done including a CBC and CMP.  Gastroenterology was consulted for further evaluation recommendations COVID testing was not done yet and still pending  Review of  Systems: As per HPI otherwise all other systems reviewed and negative.   Past Medical History:  Diagnosis Date   Anxiety    Pancreatitis   -Splenic Vein Thrombosis  -Pancreatic Pseudocyts   Past Surgical History:  Procedure Laterality Date   NO PAST SURGERIES     SOCIAL HISTORY  reports that he has been smoking cigars. He started smoking about 18 years ago. He has never used smokeless tobacco. He reports current alcohol use. He reports that he does not use drugs.  Allergies  Allergen Reactions   Other Anaphylaxis    boysenberry   Family History  Problem Relation Age of Onset   Hypertension Mother    Alcoholism Maternal Uncle    Diabetes Maternal Grandmother    Diabetes Maternal Grandfather    Diabetes Paternal Grandmother    Diabetes Paternal Grandfather    Colon cancer Neg Hx    Esophageal cancer Neg Hx    Inflammatory bowel disease Neg Hx    Liver disease Neg Hx    Pancreatic cancer Neg Hx    Rectal cancer Neg Hx    Stomach cancer Neg Hx    Prior to Admission medications   Medication Sig Start Date End Date Taking? Authorizing Provider  apixaban (ELIQUIS) 5 MG TABS tablet Take 2 tablets (32m) twice daily for 7 days, then 1 tablet (588m twice daily 08/14/21   LoCarmin MuskratMD  ibuprofen (ADVIL) 200 MG tablet Take 400 mg by mouth every 6 (six) hours as needed for  mild pain.    [provider]  lipase/protease/amylase (CREON) 36000 UNITS CPEP capsule Take 2 capsules (72,000 Units total) by mouth 3 (three) times daily with meals. May also take 1 capsule (36,000 Units total) as needed (with snacks). 08/31/21   [name], Telford Nab., MD  Multiple Vitamins-Minerals (CENTRUM) tablet Take 1 tablet by mouth daily.    [provider]  traMADol (ULTRAM) 50 MG tablet Take 0.5 tablets (25 mg total) by mouth every 12 (twelve) hours as needed for severe pain. 08/20/21   [name], Telford Nab., MD   Physical Exam: Vitals:   09/13/21 1545 09/13/21 1600 09/13/21  1615 09/13/21 1630  BP: (!) 124/97 (!) 133/106 (!) 142/109 (!) 133/102  Pulse: (!) 107   (!) 107  Resp: 17 20 (!) 22 15  Temp:      TempSrc:      SpO2: 100% 99%  99%  Height:       Constitutional: Thin African-American male currently in no acute distress appears calm Eyes: Lids and conjunctivae normal, sclerae anicteric  ENMT: External Ears, Nose appear normal. Grossly normal hearing.  Neck: Appears normal, supple, no cervical masses, normal ROM, no appreciable thyromegaly; no appreciable JVD Respiratory: Diminished to auscultation bilaterally, no wheezing, rales, rhonchi or crackles. Normal respiratory effort and patient is not tachypenic. No accessory muscle use.  Unlabored breathing Cardiovascular: Tachycardic rate but regular rhythm, no murmurs / rubs / gallops. S1 and S2 auscultated. No extremity edema.  Abdomen: Soft, slightly tender to palpate.  Non-distended.  Bowel sounds positive.  GU: Deferred. Musculoskeletal: No clubbing / cyanosis of digits/nails. No joint deformity upper and lower extremities.  Skin: No rashes, lesions, ulcers on limited skin evaluation has multiple tattoos noted diffusely throughout his body. No induration; Warm and dry.  Neurologic: CN 2-12 grossly intact with no focal deficits. Romberg sign and cerebellar reflexes not assessed.  Psychiatric: Normal judgment and insight. Alert and oriented x 3. Normal mood and appropriate affect.   Labs on Admission: I have personally reviewed following labs and imaging studies  CBC: Recent Labs  Lab 09/13/21 1401  WBC 9.1  NEUTROABS 7.8*  HGB 16.2  HCT 47.4  MCV 101.9*  PLT 762   Basic Metabolic Panel: Recent Labs  Lab 09/13/21 1401  NA 125*  K 4.3  CL 87*  CO2 11*  GLUCOSE 715*  BUN 20  CREATININE 1.24  CALCIUM 9.4   GFR: CrCl cannot be calculated (Unknown ideal weight.). Liver Function Tests: Recent Labs  Lab 09/13/21 1401  AST 21  ALT 45*  ALKPHOS 331*  BILITOT 2.3*  PROT 8.9*  ALBUMIN  4.5   Recent Labs  Lab 09/13/21 1401  LIPASE 122*   No results for input(s): AMMONIA in the last 168 hours. Coagulation Profile: No results for input(s): INR, PROTIME in the last 168 hours. Cardiac Enzymes: No results for input(s): CKTOTAL, CKMB, CKMBINDEX, TROPONINI in the last 168 hours. BNP (last 3 results) No results for input(s): PROBNP in the last 8760 hours. HbA1C: No results for input(s): HGBA1C in the last 72 hours. CBG: Recent Labs  Lab 09/13/21 1537 09/13/21 1610 09/13/21 1647 09/13/21 1714  GLUCAP >600* 596* 579* 494*   Lipid Profile: No results for input(s): CHOL, HDL, LDLCALC, TRIG, CHOLHDL, LDLDIRECT in the last 72 hours. Thyroid Function Tests: No results for input(s): TSH, T4TOTAL, FREET4, T3FREE, THYROIDAB in the last 72 hours. Anemia Panel: No results for input(s): VITAMINB12, FOLATE, FERRITIN, TIBC, IRON, RETICCTPCT in the last 72 hours. Urine analysis:  Component Value Date/Time   COLORURINE STRAW (A) 09/13/2021 1401   APPEARANCEUR CLEAR 09/13/2021 1401   LABSPEC 1.028 09/13/2021 1401   PHURINE 5.0 09/13/2021 1401   GLUCOSEU >=500 (A) 09/13/2021 1401   HGBUR NEGATIVE 09/13/2021 1401   BILIRUBINUR NEGATIVE 09/13/2021 1401   KETONESUR 80 (A) 09/13/2021 1401   PROTEINUR NEGATIVE 09/13/2021 1401   NITRITE NEGATIVE 09/13/2021 1401   LEUKOCYTESUR NEGATIVE 09/13/2021 1401   Sepsis Labs: !!!!!!!!!!!!!!!!!!!!!!!!!!!!!!!!!!!!!!!!!!!! '@LABRCNTIP' (procalcitonin:4,lacticidven:4) )No results found for this or any previous visit (from the past 240 hour(s)).   Radiological Exams on Admission: DG Chest 2 View  Result Date: 09/13/2021 CLINICAL DATA:  Shortness of breath. EXAM: CHEST - 2 VIEW COMPARISON:  Chest radiograph 10/07/2020 FINDINGS: The heart size and mediastinal contours are within normal limits. Hyperinflation. Both lungs are clear. No pleural effusion or pneumothorax. The visualized skeletal structures are unremarkable. IMPRESSION: No active  cardiopulmonary disease. Electronically Signed   By: Ileana Roup M.D.   On: 09/13/2021 14:39    EKG: Independently reviewed.  EKG did Admission showed a sinus tachycardia with a rate of 121 and a prolonged QTC of 523.  There is no evidence of ST elevation or depression on my interpretation  Assessment/Plan Active Problems:   DKA (diabetic ketoacidosis) ([place])  Hyperglycemia with suspected new Onset Diabetes and DKA, poA High anion gap metabolic acidosis SIRS secondary to DKA -Admit to inpatient stepdown unit -He presents with a CO2 of 11, anion gap of 27, and a chloride level of 87 -VBG done and showed a pH of 7.192, PCO2 of 37.3, PO2 of 32.8, bicarbonate level of 13.7, and O2 saturation 45.9 and a temperature 98.6 -Patient met SIRS criteria on admission given that he was tachypneic and tachycardic but has no source of infection -Does not have a history of diabetes but reports recent weight loss of 17 to 19 pounds in last few weeks, polyuria and polydipsia -Has a history of pancreatitis and has intermittent epigastric pain right upper quadrant abdominal pain with nausea -Had a syncopal episode this morning after feeling dizzy likely in the setting of massive dehydration -We will keep the patient n.p.o. for now given his abdominal pain and allow for bowel rest -He was given IV morphine 8 mg once in the ED -Initiated on IV fluid hydration and given 2 L of LR in the ED and placed on maintenance IV fluid hydration at 125 an hour -Change to D5 and LR at 125 MLS per hour once CBGs less than 250 -Initiated DKA protocol with Endo tool and insulin drip -Check beta hydroxybutyric acid -Check hemoglobin A1c -Check blood cultures x2 and UDS -CBGs are now ranging from 494-greater than 600 -Continue with antiemetics and add ondansetron p.o./IV every 6 as needed for nausea vomiting -Chest x-ray showed no acute cardiopulmonary disease -We will need diabetes education coordinator for further  evaluation  Acute syncopal episode with associated dizziness in the setting of massive dehydration from DKA as above from orthostatic hypotension -Patient reported syncopal episode today when he was getting out of his bed and did not report hitting his head -Likely patient was orthostatic -We will continue IV fluid hydration as above -Placed on fall precautions -Repeat orthostatic vital signs in the morning  Pseudohyponatremia -Patient's sodium was 125 and his blood glucose was 715; corrected his sodium is 135 twice twice daily Caryl Comes and 140 via the [place] with IV fluid hydration as above Continue to monitor and trend and repeat CMP in a.m. and monitor sodium  and blood glucose carefully  Macrocytosis without evidence of anemia -Likely he was hemoconcentrated on admission and hemoglobin/hematocrit will drop with IV fluid resuscitation -Continue to monitor for signs and symptoms of -Repeat CBC in a.m.  Chronic alcoholic pancreatitis with pseudocyst and splenic vein thrombosis Acute on chronic chronic abdominal pain -His lipase level was elevated and went from 63 and trended up to 122 -CT scan of the abdomen pelvis done last month and showed "The previously demonstrated peripancreatic fluid has resolved. Interval development of well-marginated pseudocyst cephalad to the pancreatic tail, abutting the gastric cardia; and a 7.2 cm pseudocyst inferior to the pancreatic head. Scattered coarse pancreatic parenchymal calcifications in the head and neck region. No discrete mass or ductal dilatation." -Sees Dr. Rush Landmark in the outpatient setting and the plan was to get a cyst drained and possibly have stents placed -Gastroenterology has been consulted by the EDP and awaiting further evaluation  Hyperbilirubinemia -Patient's T bili trended up and went from 1.5 and trended up to 2.3 -May consider repeat CT scan here but we will follow-up on GI recommendations -Continue  with IV fluid resuscitation repeat CMP in a.m.  Elevated ALT -Mild Patient's ALT went from 19 and trended up to 45 -Continue monitor and trend and consider right upper quadrant ultrasound and acute hepatitis panel -GI has been consulted for further evaluation recommendation  Splenic Vein Thrombosis  -Hold Home Apixaban and start Heparin gtt  Tobacco Abuse  -Counseling given  DVT prophylaxis: Anticoagulated with a heparin drip Code Status: FULL CODE Family Communication: Significant other is at bedside Disposition Plan: Pending further clinical improvement and evaluation by GI Consults called: Gastroenterology Dr. Lyndel Safe was notified by Dr. Kathrynn Humble Admission status: Inpatient stepdown unit  Severity of Illness: The appropriate patient status for this patient is INPATIENT. Inpatient status is judged to be reasonable and necessary in order to provide the required intensity of service to ensure the patient's safety. The patient's presenting symptoms, physical exam findings, and initial radiographic and laboratory data in the context of their chronic comorbidities is felt to place them at high risk for further clinical deterioration. Furthermore, it is not anticipated that the patient will be medically stable for discharge from the hospital within 2 midnights of admission.   * I certify that at the point of admission it is my clinical judgment that the patient will require inpatient hospital care spanning beyond 2 midnights from the point of admission due to high intensity of service, high risk for further deterioration and high frequency of surveillance required.Kerney Elbe, D.O. [facility] PAG[facility] is on [place]  If 7PM-7AM, please contact night-coverage [email or web address]  09/13/2021, 5:24 PM

## 2021-09-13 NOTE — ED Triage Notes (Signed)
Patient reports shobr x2 days, left side pain, history of pancreatitis, says he fainted today when he got out of bed. Pain rated 8/10 in triage

## 2021-09-13 NOTE — ED Notes (Signed)
Glucose 715. Per lab.

## 2021-09-13 NOTE — ED Notes (Signed)
Pt NAD in bed, a/ox4, c/o feeling dizzy x 2 days and had syncopal episode this morning. Pt c/o recent weightloss of 17-19lbs in 2 weeks, polyuria and polydipsia. Pt also has HX pancreatitis and has 8/10 epigastric/RUQ ABD pain with nausea

## 2021-09-13 NOTE — ED Notes (Signed)
Dr. Rhunette Croft notified of CBG 715.

## 2021-09-13 NOTE — Progress Notes (Signed)
ANTICOAGULATION CONSULT NOTE -   Consult  Pharmacy Consult for IV heparin Indication: DVT  Allergies  Allergen Reactions   Other Anaphylaxis    Reaction to boysenberry syrup    Patient Measurements: Height: 6\' 4"  (193 cm) Weight: 55 kg (121 lb 4.1 oz) IBW/kg (Calculated) : 86.8 Heparin Dosing Weight: 55 kg  Vital Signs: Temp: 97.2 F (36.2 C) (11/06 1502) Temp Source: Rectal (11/06 1502) BP: 119/85 (11/06 1915) Pulse Rate: 109 (11/06 1915)  Labs: Recent Labs    09/13/21 1401 09/13/21 1922  HGB 16.2  --   HCT 47.4  --   PLT 400  --   HEPARINUNFRC  --  <0.10*  CREATININE 1.24 0.69    Estimated Creatinine Clearance: 100.3 mL/min (by C-G formula based on SCr of 0.69 mg/dL).   Medical History: Past Medical History:  Diagnosis Date   Anxiety    Pancreatitis     Medications:  (Not in a hospital admission)   Assessment: Pharmacy is consulted to dose heparin drip in 35 yo male who is on apixaban PTA for hx of spenic vein thrombosis. Per med rec states that LD of apixaban was PM of 09/12/2021.   Today, 09/13/21  Baseline labs drawn Hgb 16.2, per notes hemoconcentrated due to dehydration. Likely will drop  Plt 400  SCr 0.69 mg/dl, CrCl > 13/06/22 ml/min  Baseline HL is < 0.10. Question of compliance based on this lab result      Goal of Therapy:  Heparin level 0.3-0.7 units/ml Monitor platelets by anticoagulation protocol: Yes   Plan:  As the baseline HL is <0.10 will order bolus  Heparin 3800 unit bolus followed by heparin 1000 units/hr  Obtain HL 6 hours after start of infusion  Daily CBC while on heparin Monitor for signs and symptoms of bleeding  034, PharmD, BCPS 09/13/2021 8:05 PM

## 2021-09-13 NOTE — ED Provider Notes (Signed)
[facility] DEPT Provider Note   CSN: [id number] Arrival date & time: 09/13/21  1255     History Chief Complaint  Patient presents with   Abdominal Pain   Shortness of Breath    Carlos [name] is a 35 y.o. male.  HPI     35 year old male comes in with chief complaint of abdominal pain and fainting.  He has history of pancreatitis, splenic vein thrombosis on anticoagulation and previous history of alcoholic pancreatitis.  Patient reports that he has had chronic abdominal pain, but it has gotten worse more recently.   Today he had episode of syncope that prompted him to come to the ER.  Patient is noted to have elevated blood sugar, he reports that he does not have any history of diabetes.  Review of system is negative for any fevers, chills, diarrhea.   Past Medical History:  Diagnosis Date   Anxiety    Pancreatitis     Patient Active Problem List   Diagnosis Date Noted   DKA (diabetic ketoacidosis) ([place]) 09/13/2021   Chronic anticoagulation 08/22/2021   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   Pseudocyst of pancreas 08/22/2021   History of pancreatitis 08/22/2021   Abdominal pain, chronic, epigastric 08/22/2021   Abnormal CT of the abdomen 08/22/2021   Early satiety 08/22/2021   History of ETT    Hyperglycemia 09/27/2020   Hyponatremia 09/27/2020   Transaminitis 09/27/2020   Severe sepsis ([place]) 09/27/2020   Alcohol withdrawal delirium ([place])    Acute alcoholic pancreatitis 40/98/1191   Alcohol dependence syndrome ([place]) 09/26/2020   Tobacco dependence 09/26/2020    Past Surgical History:  Procedure Laterality Date   NO PAST SURGERIES         Family History  Problem Relation Age of Onset   Hypertension Mother    Alcoholism Maternal Uncle    Diabetes Maternal Grandmother    Diabetes Maternal Grandfather    Diabetes Paternal Grandmother    Diabetes Paternal Grandfather    Colon cancer Neg Hx    Esophageal cancer Neg Hx     Inflammatory bowel disease Neg Hx    Liver disease Neg Hx    Pancreatic cancer Neg Hx    Rectal cancer Neg Hx    Stomach cancer Neg Hx     Social History   Tobacco Use   Smoking status: Every Day    Types: Cigars    Start date: 11/08/2002   Smokeless tobacco: Never   Tobacco comments:    Black and Mild- 2-3 a day, Started at age 72   Vaping Use   Vaping Use: Never used  Substance Use Topics   Alcohol use: Yes    Comment: 1-2 per day   Drug use: Never    Home Medications Prior to Admission medications   Medication Sig Start Date End Date Taking? Authorizing Provider  apixaban (ELIQUIS) 5 MG TABS tablet Take 2 tablets (34m) twice daily for 7 days, then 1 tablet (549m twice daily 08/14/21   LoCarmin MuskratMD  ibuprofen (ADVIL) 200 MG tablet Take 400 mg by mouth every 6 (six) hours as needed for mild pain.    [provider]  lipase/protease/amylase (CREON) 36000 UNITS CPEP capsule Take 2 capsules (72,000 Units total) by mouth 3 (three) times daily with meals. May also take 1 capsule (36,000 Units total) as needed (with snacks). 08/31/21   [name], GaTelford Nab MD  Multiple Vitamins-Minerals (CENTRUM) tablet Take 1 tablet by mouth daily.    [provider]  traMADol (ULTRAM) 50 MG tablet Take 0.5 tablets (25 mg total) by mouth every 12 (twelve) hours as needed for severe pain. 08/20/21   [name], Telford Nab., MD    Allergies    Other  Review of Systems   Review of Systems  Constitutional:  Positive for activity change.  Cardiovascular:  Negative for chest pain.  Gastrointestinal:  Positive for abdominal pain.  Allergic/Immunologic: Negative for immunocompromised state.  All other systems reviewed and are negative.  Physical Exam Updated Vital Signs BP (!) 133/102   Pulse (!) 107   Temp (!) 97.2 F (36.2 C) (Rectal)   Resp 15   Ht _0  (1.93 m)   SpO2 99%   BMI 16.92 kg/m   Physical Exam Vitals and nursing note reviewed.  Constitutional:       Appearance: He is well-developed.  HENT:     Head: Atraumatic.  Cardiovascular:     Rate and Rhythm: Normal rate.  Pulmonary:     Effort: Pulmonary effort is normal.  Abdominal:     Tenderness: There is abdominal tenderness in the epigastric area. There is no guarding or rebound.  Musculoskeletal:     Cervical back: Neck supple.  Skin:    General: Skin is warm.  Neurological:     Mental Status: He is alert and oriented to person, place, and time.    ED Results / Procedures / Treatments   Labs (all labs ordered are listed, but only abnormal results are displayed) Labs Reviewed  CBC WITH DIFFERENTIAL/PLATELET - Abnormal; Notable for the following components:      Result Value   MCV 101.9 (*)    MCH 34.8 (*)    Neutro Abs 7.8 (*)    All other components within normal limits  COMPREHENSIVE METABOLIC PANEL - Abnormal; Notable for the following components:   Sodium 125 (*)    Chloride 87 (*)    CO2 11 (*)    Glucose, Bld 715 (*)    Total Protein 8.9 (*)    ALT 45 (*)    Alkaline Phosphatase 331 (*)    Total Bilirubin 2.3 (*)    Anion gap 27 (*)    All other components within normal limits  LIPASE, BLOOD - Abnormal; Notable for the following components:   Lipase 122 (*)    All other components within normal limits  URINALYSIS, ROUTINE W REFLEX MICROSCOPIC - Abnormal; Notable for the following components:   Color, Urine STRAW (*)    Glucose, UA >=500 (*)    Ketones, ur 80 (*)    Bacteria, UA RARE (*)    All other components within normal limits  BLOOD GAS, VENOUS - Abnormal; Notable for the following components:   pH, Ven 7.192 (*)    pCO2, Ven 37.3 (*)    Bicarbonate 13.7 (*)    Acid-base deficit 13.8 (*)    All other components within normal limits  CBG MONITORING, ED - Abnormal; Notable for the following components:   Glucose-Capillary >600 (*)    All other components within normal limits  CBG MONITORING, ED - Abnormal; Notable for the following components:    Glucose-Capillary 596 (*)    All other components within normal limits  CBG MONITORING, ED - Abnormal; Notable for the following components:   Glucose-Capillary 579 (*)    All other components within normal limits  CBG MONITORING, ED - Abnormal; Notable for the following components:   Glucose-Capillary 494 (*)    All other components  within normal limits  RESP PANEL BY RT-PCR (FLU A&B, COVID) ARPGX2  BASIC METABOLIC PANEL  BASIC METABOLIC PANEL  BASIC METABOLIC PANEL  BETA-HYDROXYBUTYRIC ACID  BETA-HYDROXYBUTYRIC ACID  BASIC METABOLIC PANEL  RAPID URINE DRUG SCREEN, HOSP PERFORMED    EKG EKG Interpretation  Date/Time:  _84  year old comes in with chief complaint of syncope. He has history of pancreatitis and pseudocyst.  It appears that he is seeing GI doctors and is supposed to get invasive procedures.  Patient has chronic abdominal pain that has gotten worse more recently without any fevers, chills.  On exam patient has generalized abdominal tenderness without any peritonitis.  He is nontoxic appearing.  Syncope occurred when he got out of the bed, appears to  be orthostatic type syncope.  2 L of IV fluid ordered.  EKG does show sinus tachycardia with QT  prolongation, but QT is only slightly high, and the tachycardia is likely result of dehydration.  Patient's lab reveals elevated blood sugar with anion gap of 27.  He does not have history of diabetes.  DKA is likely a result of his pancreas dysfunction.  Along with the IV fluids, insulin has been ordered.  Clinical suspicion for infection or complication of the pseudocyst is low.  His alk phos and total bili are also slightly elevated to baseline, but will defer advanced imaging to GI team.  Dr. Shelton Silvas, hospitalist is admitting.  I have sent a message to Dr. Lyndel Safe, GI they will see the patient as well.  Final Clinical Impression(s) / ED Diagnoses Final diagnoses:  Diabetic ketoacidosis without coma associated with diabetes mellitus due to underlying condition ([place])  Elevated LFTs    Rx / DC Orders ED Discharge Orders     None        Varney Biles, MD 09/13/21 1742

## 2021-09-13 NOTE — ED Provider Notes (Signed)
Emergency Medicine Provider Triage Evaluation Note  Carlos [name] , a 35 y.o. male  was evaluated in triage.  Pt complains of abdominal pain, syncope and shortness of breath.  States he has been having left abdominal pain intermittently for the last 2 days but worsened today.  History of pancreatitis, patient feels similar.  Reports he had a syncopal episode today when he was getting out of bed, does not report hitting his head.  Denies any chest pain but does endorse feeling short of breath over the last couple days..  Review of Systems  Positive: Syncope, shortness of breath, abdominal pain Negative: Chest pain, vomiting  Physical Exam  BP (!) 148/102 (BP Location: Right Arm)   Pulse 78   Temp 97.7 F (36.5 C) (Oral)   Resp 16   Ht 6\' 4"  (1.93 m)   SpO2 99%   BMI 16.92 kg/m  Gen:   Awake, no distress   Resp:  Normal effort  MSK:   Moves extremities without difficulty  Other:  Radial pulse equal by laterally, epigastric tenderness.                                                                         Medical Decision Making  Medically screening exam initiated at 1:20 PM.  Appropriate orders placed.  was informed that the remainder of the evaluation will be completed by another provider, this initial triage assessment does not replace that evaluation, and the importance of remaining in the ED until their evaluation is complete.  Abdominal labs, will get chest x-ray and EKG given syncopal episode and shortness of breath.   [name], PA-C 09/13/21 1322    13/06/22, MD 09/13/21 1348

## 2021-09-14 ENCOUNTER — Other Ambulatory Visit: Payer: Self-pay

## 2021-09-14 ENCOUNTER — Inpatient Hospital Stay (HOSPITAL_COMMUNITY): Payer: Managed Care, Other (non HMO)

## 2021-09-14 ENCOUNTER — Encounter (HOSPITAL_COMMUNITY): Payer: Self-pay | Admitting: Internal Medicine

## 2021-09-14 DIAGNOSIS — K860 Alcohol-induced chronic pancreatitis: Secondary | ICD-10-CM

## 2021-09-14 DIAGNOSIS — K863 Pseudocyst of pancreas: Secondary | ICD-10-CM

## 2021-09-14 DIAGNOSIS — R1084 Generalized abdominal pain: Secondary | ICD-10-CM

## 2021-09-14 DIAGNOSIS — I82890 Acute embolism and thrombosis of other specified veins: Secondary | ICD-10-CM

## 2021-09-14 DIAGNOSIS — E1110 Type 2 diabetes mellitus with ketoacidosis without coma: Principal | ICD-10-CM

## 2021-09-14 LAB — HEMOGLOBIN A1C
Hgb A1c MFr Bld: 10.1 % — ABNORMAL HIGH (ref 4.8–5.6)
Mean Plasma Glucose: 243.17 mg/dL

## 2021-09-14 LAB — GLUCOSE, CAPILLARY
Glucose-Capillary: 120 mg/dL — ABNORMAL HIGH (ref 70–99)
Glucose-Capillary: 155 mg/dL — ABNORMAL HIGH (ref 70–99)
Glucose-Capillary: 178 mg/dL — ABNORMAL HIGH (ref 70–99)
Glucose-Capillary: 182 mg/dL — ABNORMAL HIGH (ref 70–99)
Glucose-Capillary: 189 mg/dL — ABNORMAL HIGH (ref 70–99)
Glucose-Capillary: 189 mg/dL — ABNORMAL HIGH (ref 70–99)
Glucose-Capillary: 218 mg/dL — ABNORMAL HIGH (ref 70–99)
Glucose-Capillary: 225 mg/dL — ABNORMAL HIGH (ref 70–99)
Glucose-Capillary: 317 mg/dL — ABNORMAL HIGH (ref 70–99)
Glucose-Capillary: 320 mg/dL — ABNORMAL HIGH (ref 70–99)
Glucose-Capillary: 427 mg/dL — ABNORMAL HIGH (ref 70–99)
Glucose-Capillary: 459 mg/dL — ABNORMAL HIGH (ref 70–99)
Glucose-Capillary: 484 mg/dL — ABNORMAL HIGH (ref 70–99)

## 2021-09-14 LAB — MRSA NEXT GEN BY PCR, NASAL: MRSA by PCR Next Gen: NOT DETECTED

## 2021-09-14 LAB — BASIC METABOLIC PANEL
Anion gap: 8 (ref 5–15)
Anion gap: 5 (ref 5–15)
Anion gap: 8 (ref 5–15)
BUN: 15 mg/dL (ref 6–20)
BUN: 15 mg/dL (ref 6–20)
BUN: 15 mg/dL (ref 6–20)
CO2: 24 mmol/L (ref 22–32)
CO2: 24 mmol/L (ref 22–32)
CO2: 26 mmol/L (ref 22–32)
Calcium: 9 mg/dL (ref 8.9–10.3)
Calcium: 8.8 mg/dL — ABNORMAL LOW (ref 8.9–10.3)
Calcium: 8.7 mg/dL — ABNORMAL LOW (ref 8.9–10.3)
Chloride: 99 mmol/L (ref 98–111)
Chloride: 101 mmol/L (ref 98–111)
Chloride: 94 mmol/L — ABNORMAL LOW (ref 98–111)
Creatinine, Ser: 0.63 mg/dL (ref 0.61–1.24)
Creatinine, Ser: 0.52 mg/dL — ABNORMAL LOW (ref 0.61–1.24)
Creatinine, Ser: 0.81 mg/dL (ref 0.61–1.24)
GFR, Estimated: >60 mL/min (ref >60)
GFR, Estimated: >60 mL/min (ref >60)
GFR, Estimated: >60 mL/min (ref >60)
Glucose, Bld: 158 mg/dL — ABNORMAL HIGH (ref 70–99)
Glucose, Bld: 145 mg/dL — ABNORMAL HIGH (ref 70–99)
Glucose, Bld: 402 mg/dL — ABNORMAL HIGH (ref 70–99)
Potassium: 3.4 mmol/L — ABNORMAL LOW (ref 3.5–5.1)
Potassium: 3.2 mmol/L — ABNORMAL LOW (ref 3.5–5.1)
Potassium: 3.7 mmol/L (ref 3.5–5.1)
Sodium: 131 mmol/L — ABNORMAL LOW (ref 135–145)
Sodium: 130 mmol/L — ABNORMAL LOW (ref 135–145)
Sodium: 128 mmol/L — ABNORMAL LOW (ref 135–145)

## 2021-09-14 LAB — BETA-HYDROXYBUTYRIC ACID
Beta-Hydroxybutyric Acid: 0.17 mmol/L (ref 0.05–0.27)
Beta-Hydroxybutyric Acid: 3.11 mmol/L — ABNORMAL HIGH (ref 0.05–0.27)

## 2021-09-14 LAB — HEPATIC FUNCTION PANEL
ALT: 31 U/L (ref 0–44)
AST: 22 U/L (ref 15–41)
Albumin: 2.9 g/dL — ABNORMAL LOW (ref 3.5–5.0)
Alkaline Phosphatase: 203 U/L — ABNORMAL HIGH (ref 38–126)
Bilirubin, Direct: 0.2 mg/dL (ref 0.0–0.2)
Indirect Bilirubin: 0.7 mg/dL (ref 0.3–0.9)
Total Bilirubin: 0.9 mg/dL (ref 0.3–1.2)
Total Protein: 6.2 g/dL — ABNORMAL LOW (ref 6.5–8.1)

## 2021-09-14 LAB — BASIC METABOLIC PANEL WITH GFR
Anion gap: 11 (ref 5–15)
BUN: 15 mg/dL (ref 6–20)
CO2: 24 mmol/L (ref 22–32)
Calcium: 8.8 mg/dL — ABNORMAL LOW (ref 8.9–10.3)
Chloride: 96 mmol/L — ABNORMAL LOW (ref 98–111)
Creatinine, Ser: 0.6 mg/dL — ABNORMAL LOW (ref 0.61–1.24)
GFR, Estimated: >60 mL/min
Glucose, Bld: 381 mg/dL — ABNORMAL HIGH (ref 70–99)
Potassium: 3.8 mmol/L (ref 3.5–5.1)
Sodium: 131 mmol/L — ABNORMAL LOW (ref 135–145)

## 2021-09-14 LAB — CBC
HCT: 35.9 % — ABNORMAL LOW (ref 39.0–52.0)
Hemoglobin: 13.1 g/dL (ref 13.0–17.0)
MCH: 35.1 pg — ABNORMAL HIGH (ref 26.0–34.0)
MCHC: 36.5 g/dL — ABNORMAL HIGH (ref 30.0–36.0)
MCV: 96.2 fL (ref 80.0–100.0)
Platelets: 250 10*3/uL (ref 150–400)
RBC: 3.73 MIL/uL — ABNORMAL LOW (ref 4.22–5.81)
RDW: 11.7 % (ref 11.5–15.5)
WBC: 6.8 10*3/uL (ref 4.0–10.5)
nRBC: 0 % (ref 0.0–0.2)

## 2021-09-14 LAB — HEPARIN LEVEL (UNFRACTIONATED)
Heparin Unfractionated: 0.17 IU/mL — ABNORMAL LOW (ref 0.30–0.70)
Heparin Unfractionated: <0.1 [IU]/mL — ABNORMAL LOW (ref 0.30–0.70)

## 2021-09-14 MED ORDER — ACETAMINOPHEN 325 MG PO TABS
650.0000 mg | ORAL_TABLET | Freq: Four times a day (QID) | ORAL | Status: DC | PRN
  Administered 2021-09-14: 650 mg via ORAL
  Filled 2021-09-14: qty 2

## 2021-09-14 MED ORDER — INFLUENZA VAC SPLIT QUAD 0.5 ML IM SUSY
0.5000 mL | PREFILLED_SYRINGE | INTRAMUSCULAR | Status: AC
  Administered 2021-09-15: 0.5 mL via INTRAMUSCULAR
  Filled 2021-09-14: qty 0.5

## 2021-09-14 MED ORDER — LACTATED RINGERS IV SOLN: INTRAVENOUS | Status: DC

## 2021-09-14 MED ORDER — IOHEXOL 9 MG/ML PO SOLN
500.0000 mL | ORAL | Status: AC
  Administered 2021-09-14 (×2): 500 mL via ORAL

## 2021-09-14 MED ORDER — DEXTROSE IN LACTATED RINGERS 5 % IV SOLN: INTRAVENOUS | Status: DC

## 2021-09-14 MED ORDER — INSULIN ASPART 100 UNIT/ML IJ SOLN
0.0000 [IU] | Freq: Every day | INTRAMUSCULAR | Status: DC
  Administered 2021-09-14: 4 [IU] via SUBCUTANEOUS

## 2021-09-14 MED ORDER — ADULT MULTIVITAMIN W/MINERALS CH
1.0000 | ORAL_TABLET | Freq: Every day | ORAL | Status: DC
  Administered 2021-09-14 – 2021-09-16 (×3): 1 via ORAL
  Filled 2021-09-14 (×3): qty 1

## 2021-09-14 MED ORDER — MORPHINE SULFATE (PF) 2 MG/ML IV SOLN
2.0000 mg | INTRAVENOUS | Status: DC | PRN
  Administered 2021-09-14: 2 mg via INTRAVENOUS
  Filled 2021-09-14: qty 1

## 2021-09-14 MED ORDER — INSULIN GLARGINE-YFGN 100 UNIT/ML ~~LOC~~ SOLN
10.0000 [IU] | SUBCUTANEOUS | Status: DC
  Administered 2021-09-14 – 2021-09-16 (×3): 10 [IU] via SUBCUTANEOUS
  Filled 2021-09-14 (×3): qty 0.1

## 2021-09-14 MED ORDER — HEPARIN BOLUS VIA INFUSION
1600.0000 [IU] | Freq: Once | INTRAVENOUS | Status: AC
  Administered 2021-09-14: 1600 [IU] via INTRAVENOUS
  Filled 2021-09-14: qty 1600

## 2021-09-14 MED ORDER — OXYCODONE HCL 5 MG PO TABS
5.0000 mg | ORAL_TABLET | Freq: Four times a day (QID) | ORAL | Status: DC | PRN
  Administered 2021-09-14 – 2021-09-15 (×3): 5 mg via ORAL
  Filled 2021-09-14 (×3): qty 1

## 2021-09-14 MED ORDER — PANCRELIPASE (LIP-PROT-AMYL) 12000-38000 UNITS PO CPEP
36000.0000 [IU] | ORAL_CAPSULE | Freq: Three times a day (TID) | ORAL | Status: DC
  Administered 2021-09-14 – 2021-09-16 (×7): 36000 [IU] via ORAL
  Filled 2021-09-14 (×4): qty 3
  Filled 2021-09-14: qty 1
  Filled 2021-09-14 (×2): qty 3
  Filled 2021-09-14 (×2): qty 1

## 2021-09-14 MED ORDER — INSULIN ASPART 100 UNIT/ML IJ SOLN
0.0000 [IU] | Freq: Three times a day (TID) | INTRAMUSCULAR | Status: DC
  Administered 2021-09-14: 15 [IU] via SUBCUTANEOUS
  Administered 2021-09-15: 3 [IU] via SUBCUTANEOUS
  Administered 2021-09-15: 5 [IU] via SUBCUTANEOUS
  Administered 2021-09-15: 8 [IU] via SUBCUTANEOUS
  Administered 2021-09-16: 3 [IU] via SUBCUTANEOUS
  Administered 2021-09-16: 5 [IU] via SUBCUTANEOUS

## 2021-09-14 MED ORDER — INSULIN STARTER KIT- PEN NEEDLES (ENGLISH)
1.0000 | Freq: Once | Status: AC
  Administered 2021-09-14: 1
  Filled 2021-09-14: qty 1

## 2021-09-14 MED ORDER — POTASSIUM CHLORIDE 10 MEQ/100ML IV SOLN
10.0000 meq | INTRAVENOUS | Status: AC
  Administered 2021-09-14 (×2): 10 meq via INTRAVENOUS
  Filled 2021-09-14 (×2): qty 100

## 2021-09-14 MED ORDER — DEXTROSE 50 % IV SOLN: 0.0000 mL | INTRAVENOUS | Status: DC | PRN

## 2021-09-14 MED ORDER — HYDRALAZINE HCL 20 MG/ML IJ SOLN: 5.0000 mg | Freq: Four times a day (QID) | INTRAMUSCULAR | Status: DC | PRN

## 2021-09-14 MED ORDER — INSULIN ASPART 100 UNIT/ML IJ SOLN
0.0000 [IU] | Freq: Three times a day (TID) | INTRAMUSCULAR | Status: DC
  Administered 2021-09-14: 9 [IU] via SUBCUTANEOUS

## 2021-09-14 MED ORDER — POTASSIUM CHLORIDE CRYS ER 20 MEQ PO TBCR
40.0000 meq | EXTENDED_RELEASE_TABLET | Freq: Two times a day (BID) | ORAL | Status: DC
Start: 2021-09-14 — End: 2021-09-16
  Administered 2021-09-14 – 2021-09-16 (×5): 40 meq via ORAL
  Filled 2021-09-14 (×5): qty 2

## 2021-09-14 MED ORDER — CHLORHEXIDINE GLUCONATE CLOTH 2 % EX PADS
6.0000 | MEDICATED_PAD | Freq: Every day | CUTANEOUS | Status: DC
  Administered 2021-09-14: 6 via TOPICAL

## 2021-09-14 MED ORDER — SODIUM CHLORIDE 0.9 % IV BOLUS: 1000.0000 mL | Freq: Once | INTRAVENOUS | Status: DC

## 2021-09-14 MED ORDER — IOHEXOL 350 MG/ML SOLN
80.0000 mL | Freq: Once | INTRAVENOUS | Status: AC | PRN
  Administered 2021-09-14: 80 mL via INTRAVENOUS

## 2021-09-14 MED ORDER — LACTATED RINGERS IV BOLUS: 1000.0000 mL | INTRAVENOUS | Status: AC

## 2021-09-14 MED ORDER — INSULIN ASPART 100 UNIT/ML IJ SOLN: 0.0000 [IU] | Freq: Every day | INTRAMUSCULAR | Status: DC

## 2021-09-14 MED ORDER — INSULIN REGULAR(HUMAN) IN NACL 100-0.9 UT/100ML-% IV SOLN: INTRAVENOUS | Status: AC

## 2021-09-14 MED ORDER — SODIUM CHLORIDE 0.9 % IV SOLN: INTRAVENOUS | Status: DC | PRN

## 2021-09-14 MED ORDER — LACTATED RINGERS IV BOLUS: 20.0000 mL/kg | Freq: Once | INTRAVENOUS | Status: DC

## 2021-09-14 MED ORDER — LIVING WELL WITH DIABETES BOOK
Freq: Once | Status: AC
  Filled 2021-09-14: qty 1

## 2021-09-14 NOTE — Progress Notes (Signed)
PROGRESS NOTE    Carlos [name]  VFI:[id number] DOB: 11-Oct-1986 DOA: 09/13/2021 PCP: Merryl Hacker, No   Brief Narrative:  Carlos [name] is a 35 y.o. 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  Patient presented to the ED with a chief complaint of abdominal pain, passing out and some mild shortness of breath.  He states he started having left-sided abdominal pain intermittent for last 2 days but worsened.  He has a history of pancreatitis that feels similar in the past.  He reports he had a syncopal episode when he tried to get out of bed and states when he stood up he fell but did not hit his head.  He denies any chest pain but did endorse feeling shortness of breath for last 2 days which is now improved.  Because of his fall, his significant other brought him to the ED for further evaluation And found to have a significantly elevated blood glucose and hyponatremia.  Patient's lipase level was also elevated and he was hyperbilirubinemic.  He endorses weight loss of 17 to 19 pounds in last few weeks, polyuria, polydipsia and had some epigastric right upper quadrant abdominal pain with nausea which is now improving.  TRH was asked admit this patient for hyperglycemia in the setting of DKA with a syncopal episode.   ED Course: In The ED he was given IV fluid hydration and given 2 L of boluses and then placed on a insulin drip and then maintenance IV fluids.  He basic blood work done including a CBC and CMP.  Gastroenterology was consulted for further evaluation recommendations COVID testing was not done yet and still pending  **Interim History His DKA improved and is transition to long-acting insulin with Semglee.   Patient drank a Sprite and so his blood sugars was elevated.  Diabetes education coronary pending to see the patient.  GI has evaluated and recommending a CT scan of the abdomen pelvis and medical oncology/hematology was consulted for splenic vein thrombosis and recommends no further anticoagulation and have discontinued his heparin drip  Assessment & Plan:   Active Problems:   DKA (diabetic ketoacidosis) ([place])   Hyperglycemia with suspected new Onset Diabetes and DKA, poA High anion gap metabolic acidosis SIRS secondary to DKA -Admit to inpatient stepdown unit -He presents with a CO2 of 11, anion gap of 27, and a chloride level of 87 -VBG done and showed a pH of 7.192, PCO2 of 37.3, PO2 of 32.8, bicarbonate level of 13.7, and O2 saturation 45.9 and a temperature 98.6 -Patient met SIRS criteria on admission given that he was tachypneic and tachycardic but has no source of infection -Does not have a history of diabetes but reports recent weight loss of 17 to 19 pounds in last few weeks, polyuria and polydipsia -Has a history of pancreatitis and has intermittent epigastric pain right upper quadrant abdominal pain with nausea -Had a syncopal episode this morning after feeling dizzy likely in the setting of massive dehydration -We will keep the patient n.p.o. for now given his abdominal pain and allow for bowel rest -He was given IV morphine 8 mg once in the ED and had resumed IV morphine 2 mg every 3 as needed -Initiated on IV fluid hydration and given 2 L of  LR in the ED and placed on maintenance IV fluid hydration at 125 an hour -Changed to D5 and LR at 125 MLS per hour once CBGs less than 250 -Initiated DKA protocol with Endo tool and insulin drip but will change to Semglee 10 units as we have started him on a diet now -Check beta hydroxybutyric acid and was elevated at 2.62 and trended down to 0.17 -Trended BMPs every 4 -Checked hemoglobin A1c and was 10.1 -Check blood cultures x2 and UDS;  UDS was negative but blood cultures have not been obtained yet -CO2 is now 24, anion gap is 11, chloride level is 96 -CBGs are now ranging from 128-459; elevated in the setting of Sprite -Continue with antiemetics and add ondansetron p.o./IV every 6 as needed for nausea vomiting -Chest x-ray showed no acute cardiopulmonary disease -We will need diabetes education coordinator for further evaluation as well as nutritionist as patient drank a Sprite   Acute syncopal episode with associated dizziness in the setting of massive dehydration from DKA as above from orthostatic hypotension -Patient reported syncopal episode today when he was getting out of his bed and did not report hitting his head -Likely patient was orthostatic -We will continue IV fluid hydration as above and continue to monitor carefully -Placed on fall precautions -Repeat orthostatic vital signs in the morning  Hypokalemia -Improved.  Patient's potassium went from 3.2 and went to 3.8 -Continue to monitor and replete as necessary -Repeat CMP in a.m.   Pseudohyponatremia -Patient's sodium was 125 and his blood glucose was 715 on admission;  -Repeat sodium this morning was 131 with a blood sugar of 381; corrected his sodium is 135 twice twice daily Caryl Comes and 138 via the [employer] -Continue with IV fluid hydration as above Continue to monitor and trend and repeat CMP in a.m. and monitor sodium and blood glucose carefully   Macrocytosis without evidence of anemia -Likely he was hemoconcentrated on admission and hemoglobin/hematocrit will drop with IV fluid resuscitation -Patient's hemoglobin/hematocrit went from 16.2/47.4 and is trended down to 13.1/35.9 -Continue to monitor for signs and symptoms of -Repeat CBC in a.m.   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 Interval development of well-marginated pseudocyst cephalad to the pancreatic tail, abutting the gastric cardia; and a 7.2 cm pseudocyst inferior to the pancreatic head. Scattered coarse pancreatic parenchymal calcifications in the head and neck region. No discrete mass or ductal dilatation." -Sees Dr. Rush Landmark in the outpatient setting and the plan was to get a cyst drained and possibly have stents placed -Gastroenterology has been consulted by the EDP and they are obtaining a CT of the abdomen pelvis with contrast to further evaluate -GI recommending continuing to eat solid food without difficulty and continue diabetic diet   Hyperbilirubinemia -Patient's T bili trended up and went from 1.5 and trended up to 2.3 and is now trended down and normalized at 0.9 -May consider repeat CT scan here but we will follow-up on GI recommendations -Continue with IV fluid resuscitation repeat CMP in a.m.  Elevated ALT -Mild Patient's ALT went from 19 and trended up to 45 and is now trended down to 31 -Continue monitor and trend and consider right upper quadrant ultrasound and acute hepatitis panel -GI has been consulted for  further evaluation recommendation   Splenic Vein Thrombosis  -Hold Home Apixaban and started Heparin gtt -Patient has collaterals noted informed and this is result of his chronic pancreatitis and pancreatic pseudocyst.  He is taking Eliquis for last month for the splenic vein thrombosis -Medical hematology consulted and they do not recommend any further anticoagulation given her as risk of bleeding from varices is high and that anticoagulation is not likely to improve his outcome   Tobacco Abuse  -Counseling given  DVT prophylaxis: Was anticoagulated with a heparin drip but will stop Code Status: FULL CODE Family Communication: Discussed with Significant Other at  bedside  Disposition Plan: Pending further clinical improvement and clearance by GI as well as evaluation by the diabetes education coordinator  Status is: Inpatient  Remains inpatient appropriate because: GI is evaluating and he will need diabetic education  Consultants:  None  Procedures:  None  Antimicrobials:  Anti-infectives (From admission, onward)    None        Subjective: Seen and examined at bedside and he is doing okay.  Still having some abdominal pain.  No nausea or vomiting.  Blood sugars were better controlled however they trended back up after he drank a Sprite.  No other concerns or plans at this time.  Objective: Vitals:   09/14/21 0400 09/14/21 0500 09/14/21 0600 09/14/21 0810  BP: (!) 120/92 (!) 129/91 133/88   Pulse: 83 86 91   Resp: _0 Temp: 98 F (36.7 C)   98 F (36.7 C)  TempSrc: Oral   Oral  SpO2: 96% 100% 97%   Weight:      Height:        Intake/Output Summary (Last 24 hours) at 09/14/2021 0911 Last data filed at 09/14/2021 0730 Gross per 24 hour  Intake 1744.89 ml  Output 550 ml  Net 1194.89 ml   Filed Weights   09/13/21 1928 09/14/21 0100  Weight: 55 kg 54.1 kg   Examination: Physical Exam:  Constitutional: Thin African-American male currently in no acute distress appears calm Eyes: Lids and conjunctivae normal, sclerae anicteric  ENMT: External Ears, Nose appear normal. Grossly normal hearing. Mucous membranes are moist.  Neck: Appears normal, supple, no cervical masses, normal ROM, no appreciable thyromegaly; no appreciable JVD Respiratory: Diminished to auscultation bilaterally, no wheezing, rales, rhonchi or crackles. Normal respiratory effort and patient is not tachypenic. No accessory muscle use.  Unlabored breathing Cardiovascular: Mildly tachycardic rate, no murmurs / rubs / gallops. S1 and S2 auscultated. No extremity edema.  Abdomen: Soft, slightly tender to palpate, non-distended. Bowel sounds positive.  GU:  Deferred. Musculoskeletal: No clubbing / cyanosis of digits/nails. No joint deformity upper and lower extremities.  Skin: No rashes, lesions, ulcers on limited skin evaluation but has multiple tattoos noted diffusely scattered throughout his body. No induration; Warm and dry.  Neurologic: CN 2-12 grossly intact with no focal deficits. Romberg sign cerebellar reflexes not assessed.  Psychiatric: Normal judgment and insight. Alert and oriented x 3. Normal mood and appropriate affect.   Data Reviewed: I have personally reviewed following labs and imaging studies  CBC: Recent Labs  Lab 09/13/21 1401 09/14/21 0610  WBC 9.1 6.8  NEUTROABS 7.8*  --   HGB 16.2 13.1  HCT 47.4 35.9*  MCV 101.9* 96.2  PLT 400 182   Basic Metabolic Panel: Recent Labs  Lab 09/13/21 1401 09/13/21 1922 09/13/21 2225 09/14/21 0308 09/14/21 0610  NA 125* 129* 129* 131* 130*  K 4.3 3.5 3.8  3.4* 3.2*  CL 87* 97* 95* 99 101  CO2 11* 18* _0 GLUCOSE 715* 174* 220* 158* 145*  BUN _1 CREATININE 1.24 0.69 0.67 0.63 0.52*  CALCIUM 9.4 8.5* 8.9 9.0 8.8*   GFR: Estimated Creatinine Clearance: 98.6 mL/min (A) (by C-G formula based on SCr of 0.52 mg/dL (L)). Liver Function Tests: Recent Labs  Lab 09/13/21 1401 09/14/21 0814  AST 21 22  ALT 45* 31  ALKPHOS 331* 203*  BILITOT 2.3* 0.9  PROT 8.9* 6.2*  ALBUMIN 4.5 2.9*   Recent Labs  Lab 09/13/21 1401  LIPASE 122*   No results for input(s): AMMONIA in the last 168 hours. Coagulation Profile: No results for input(s): INR, PROTIME in the last 168 hours. Cardiac Enzymes: No results for input(s): CKTOTAL, CKMB, CKMBINDEX, TROPONINI in the last 168 hours. BNP (last 3 results) No results for input(s): PROBNP in the last 8760 hours. HbA1C: Recent Labs    09/14/21 0610  HGBA1C 10.1*   CBG: Recent Labs  Lab 09/14/21 0325 09/14/21 0430 09/14/21 0530 09/14/21 0634 09/14/21 0734  GLUCAP 182* 189* 178* 155* 120*   Lipid  Profile: No results for input(s): CHOL, HDL, LDLCALC, TRIG, CHOLHDL, LDLDIRECT in the last 72 hours. Thyroid Function Tests: No results for input(s): TSH, T4TOTAL, FREET4, T3FREE, THYROIDAB in the last 72 hours. Anemia Panel: No results for input(s): VITAMINB12, FOLATE, FERRITIN, TIBC, IRON, RETICCTPCT in the last 72 hours. Sepsis Labs: No results for input(s): PROCALCITON, LATICACIDVEN in the last 168 hours.  Recent Results (from the past 240 hour(s))  Resp Panel by RT-PCR (Flu A&B, Covid) Nasopharyngeal Swab     Status: None   Collection Time: 09/13/21  5:22 PM   Specimen: Nasopharyngeal Swab; Nasopharyngeal(NP) swabs in vial transport medium  Result Value Ref Range Status   SARS Coronavirus 2 by RT PCR NEGATIVE NEGATIVE Final    Comment: (NOTE) SARS-CoV-2 target nucleic acids are NOT DETECTED.  The SARS-CoV-2 RNA is generally detectable in upper respiratory specimens during the acute phase of infection. The lowest concentration of SARS-CoV-2 viral copies this assay can detect is 138 copies/mL. A negative result does not preclude SARS-Cov-2 infection and should not be used as the sole basis for treatment or other patient management decisions. A negative result may occur with  improper specimen collection/handling, submission of specimen other than nasopharyngeal swab, presence of viral mutation(s) within the areas targeted by this assay, and inadequate number of viral copies(<138 copies/mL). A negative result must be combined with clinical observations, patient history, and epidemiological information. The expected result is Negative.  Fact Sheet for Patients:  EntrepreneurPulse.com.au  Fact Sheet for Healthcare Providers:  IncredibleEmployment.be  This test is no t yet approved or cleared by the [place] FDA and  has been authorized for detection and/or diagnosis of SARS-CoV-2 by FDA under an Emergency Use Authorization (EUA). This EUA  will remain  in effect (meaning this test can be used) for the duration of the COVID-19 declaration under Section 564(b)(1) of the Act, 21 U.S.C.section 360bbb-3(b)(1), unless the authorization is terminated  or revoked sooner.       Influenza A by PCR NEGATIVE NEGATIVE Final   Influenza B by PCR NEGATIVE NEGATIVE Final    Comment: (NOTE) The Xpert Xpress SARS-CoV-2/FLU/RSV plus assay is intended as an aid in the diagnosis of influenza from Nasopharyngeal swab specimens and should not be used as a sole basis for treatment. Nasal washings and aspirates are unacceptable for Xpert  Xpress SARS-CoV-2/FLU/RSV testing.  Fact Sheet for Patients: EntrepreneurPulse.com.au  Fact Sheet for Healthcare Providers: IncredibleEmployment.be  This test is not yet approved or cleared by the [place] FDA and has been authorized for detection and/or diagnosis of SARS-CoV-2 by FDA under an Emergency Use Authorization (EUA). This EUA will remain in effect (meaning this test can be used) for the duration of the COVID-19 declaration under Section 564(b)(1) of the Act, 21 U.S.C. section 360bbb-3(b)(1), unless the authorization is terminated or revoked.  Performed at [facility], [place] [address], [place], [place] [address]   MRSA Next Gen by PCR, Nasal     Status: None   Collection Time: 09/14/21 12:08 AM   Specimen: Nasal Mucosa; Nasal Swab  Result Value Ref Range Status   MRSA by PCR Next Gen NOT DETECTED NOT DETECTED Final    Comment: (NOTE) The GeneXpert MRSA Assay (FDA approved for NASAL specimens only), is one component of a comprehensive MRSA colonization surveillance program. It is not intended to diagnose MRSA infection nor to guide or monitor treatment for MRSA infections. Test performance is not FDA approved in patients less than 2 years old. Performed at [facility], [place] [address] [place], [place]  [address]     RN Pressure Injury Documentation:     Estimated body mass index is 14.33 kg/m as calculated from the following:   Height as of this encounter: 6' 4.5" (1.943 m).   Weight as of this encounter: 54.1 kg.  Malnutrition Type:   Malnutrition Characteristics:   Nutrition Interventions:    Radiology Studies: DG Chest 2 View  Result Date: 09/13/2021 CLINICAL DATA:  Shortness of breath. EXAM: CHEST - 2 VIEW COMPARISON:  Chest radiograph 10/07/2020 FINDINGS: The heart size and mediastinal contours are within normal limits. Hyperinflation. Both lungs are clear. No pleural effusion or pneumothorax. The visualized skeletal structures are unremarkable. IMPRESSION: No active cardiopulmonary disease. Electronically Signed   By: Ileana Roup M.D.   On: 09/13/2021 14:39    Scheduled Meds:  Chlorhexidine Gluconate Cloth  6 each Topical Daily   insulin aspart  0-5 Units Subcutaneous QHS   insulin aspart  0-9 Units Subcutaneous TID WC   insulin glargine-yfgn  10 Units Subcutaneous Q24H   lipase/protease/amylase  36,000 Units Oral TID WC   multivitamin with minerals  1 tablet Oral Daily   potassium chloride  40 mEq Oral BID   Continuous Infusions:  sodium chloride 10 mL/hr at 09/14/21 0600   sodium chloride 10 mL/hr at 09/14/21 0600   dextrose 5% lactated ringers 75 mL/hr at 09/14/21 0817   insulin Stopped (09/14/21 0817)   lactated ringers     sodium chloride      LOS: 1 day   Kerney Elbe, DO [facility] PAGER is on AMION  If 7PM-7AM, please contact night-coverage [email or web address]

## 2021-09-14 NOTE — Consult Note (Signed)
Consultation  Referring Provider: [facility] Alfredia Ferguson Primary Care Physician:  Pcp, No Primary Gastroenterologist:  Dr.[name]  Reason for Consultation: Chronic pancreatitis with pseudocysts, progressive abdominal pain  HPI: Carlos [name] is a 35 y.o. male, known to Dr. Rush Landmark from recent outpatient consultation on 08/20/2021.  He has history of alcohol induced pancreatitis, complicated by pancreatic pseudocysts and splenic vein thrombosis.  He had been started on anticoagulation after ER visit on 08/14/2021 with CT showing interval splenic vein occlusion with development of enlarged visceral venous collaterals.  He was also noted to have interval maturation of previously noted large pancreatic pseudocysts.  He previously demonstrated peripancreatic fluid had resolved, interval development of well marginated pseudocyst cephalad to the pancreatic tail abutting the gastric cardia and a 7.2 cm pseudocyst inferior to the pancreatic head, scattered coarse pancreatic calcifications in the head and neck, normal-appearing spleen.  Patient had initial episode of severe pancreatitis with prolonged hospitalization in November 2021.  He had reported that ever since that time he has had some level of mild ongoing abdominal pain with intermittent exacerbations.  He stopped drinking alcohol.  Plan was for him to be evaluated by hematology as an outpatient to determine need for ongoing anticoagulation for the splenic vein thrombosis, then for consideration of cyst gastrostomy.  He had been using Tylenol, ibuprofen and tramadol as needed at home for abdominal pain.  Appendectomy presented to the emergency room yesterday with progressive abdominal pain over the past several days.  Some nausea but no vomiting, unaware of any fever or chills.  No diarrhea.  Prior to coming to the emergency room he had had an episode of dizziness/presyncope with a fall, and says that he had lost between 15 and 20 pounds over the past 2 to 3  weeks despite eating what he considers a normal amount.  He endorsed polydipsia and polyuria.  No prior history of diabetes. He was found to have a glucose of over 700, and hyponatremia He met criteria for DKA with suspicion for new onset diabetes. He is being volume repleted, and has been on DKA protocol/insulin drip.  He says he feels significantly better today, complains of mild increase in abdominal pain over his baseline, had consumed a regular breakfast from [facility].  Labs today-WBC 6.8, hemoglobin 13.1/macro 35/platelets 250 Sodium 130/potassium 3.2/glucose 145/creatinine 0.52 T bili 0.9/alk phos 203/AST 22/ALT 31 Lipase 122 on admit Drug screen negative  No abdominal imaging this admission.      Past Medical History:  Diagnosis Date   Anxiety    Pancreatitis    Splenic vein thrombosis     Past Surgical History:  Procedure Laterality Date   NO PAST SURGERIES      Prior to Admission medications   Medication Sig Start Date End Date Taking? Authorizing Provider  apixaban (ELIQUIS) 5 MG TABS tablet Take 2 tablets (65m) twice daily for 7 days, then 1 tablet (533m twice daily Patient taking differently: Take 5 mg by mouth 2 (two) times daily. 08/14/21  Yes LoCarmin MuskratMD  ibuprofen (ADVIL) 200 MG tablet Take 400 mg by mouth every 6 (six) hours as needed for mild pain.   Yes [provider]  lipase/protease/amylase (CREON) 36000 UNITS CPEP capsule Take 2 capsules (72,000 Units total) by mouth 3 (three) times daily with meals. May also take 1 capsule (36,000 Units total) as needed (with snacks). Patient taking differently: Take 2 capsules (72000 units) by mouth daily with breakfast and supper, take 1 capsule (36000 units) with lunch/midday  snack 08/31/21  Yes [name], Telford Nab., MD  Multiple Vitamins-Minerals (CENTRUM) tablet Take 1 tablet by mouth every morning.   Yes [provider]  traMADol (ULTRAM) 50 MG tablet Take 0.5 tablets (25 mg total)  by mouth every 12 (twelve) hours as needed for severe pain. Patient not taking: Reported on 09/13/2021 08/20/21   [name], Telford Nab., MD    Current Facility-Administered Medications  Medication Dose Route Frequency Provider Last Rate Last Admin   0.9 %  sodium chloride infusion   Intravenous PRN Raiford Noble Latif, DO 10 mL/hr at 09/14/21 0600 Infusion Verify at 09/14/21 0600   0.9 %  sodium chloride infusion   Intravenous PRN Raiford Noble Latif, DO 10 mL/hr at 09/14/21 0600 Infusion Verify at 09/14/21 0600   Chlorhexidine Gluconate Cloth 2 % PADS 6 each  6 each Topical Daily Sheikh, Omair Latif, DO       dextrose 5 % in lactated ringers infusion   Intravenous Continuous Raiford Noble [place], DO 75 mL/hr at 09/14/21 2563 Rate Change at 09/14/21 0817   dextrose 50 % solution 0-50 mL  0-50 mL Intravenous PRN Raiford Noble Latif, DO       dextrose 50 % solution 0-50 mL  0-50 mL Intravenous PRN Sheikh, Omair Latif, DO       insulin aspart (novoLOG) injection 0-5 Units  0-5 Units Subcutaneous QHS Sheikh, Omair Latif, DO       insulin aspart (novoLOG) injection 0-9 Units  0-9 Units Subcutaneous TID WC Sheikh, Omair Latif, DO       insulin glargine-yfgn [facility]) injection 10 Units  10 Units Subcutaneous Q24H Sheikh, Omair Latif, DO       insulin regular, human (MYXREDLIN) 100 units/ 100 mL infusion   Intravenous Continuous Raiford Noble [place], DO   Stopped at 09/14/21 8937   lactated ringers bolus 1,100 mL  20 mL/kg Intravenous Once Sheikh, Georgina Quint Latif, DO       lipase/protease/amylase (CREON) capsule 36,000 Units  36,000 Units Oral TID WC Raiford Noble Latif, DO       multivitamin with minerals tablet 1 tablet  1 tablet Oral Daily Sheikh, Omair Latif, DO       potassium chloride SA (KLOR-CON) CR tablet 40 mEq  40 mEq Oral BID Sheikh, Omair Latif, DO       sodium chloride 0.9 % bolus 1,000 mL  1,000 mL Intravenous Once Sheikh, Georgina Quint Latif, DO        Allergies as of 09/13/2021 - Review Complete  09/13/2021  Allergen Reaction Noted   Other Anaphylaxis 09/26/2020    Family History  Problem Relation Age of Onset   Hypertension Mother    Alcoholism Maternal Uncle    Diabetes Maternal Grandmother    Diabetes Maternal Grandfather    Diabetes Paternal Grandmother    Diabetes Paternal Grandfather    Colon cancer Neg Hx    Esophageal cancer Neg Hx    Inflammatory bowel disease Neg Hx    Liver disease Neg Hx    Pancreatic cancer Neg Hx    Rectal cancer Neg Hx    Stomach cancer Neg Hx     Social History   Socioeconomic History   Marital status: Single    Spouse name: Not on file   Number of children: 6   Years of education: Not on file   Highest education level: Not on file  Occupational History   Occupation: forklift driver  Tobacco Use   Smoking status: Every Day  Types: Cigars    Start date: 11/08/2002   Smokeless tobacco: Never   Tobacco comments:    Black and Mild- 2-3 a day, Started at age 89   Vaping Use   Vaping Use: Never used  Substance and Sexual Activity   Alcohol use: Yes    Comment: 1-2 per day   Drug use: Never   Sexual activity: Not on file  Other Topics Concern   Not on file  Social History Narrative   Not on file   Social Determinants of Health   Financial Resource Strain: Not on file  Food Insecurity: Not on file  Transportation Needs: Not on file  Physical Activity: Not on file  Stress: Not on file  Social Connections: Not on file  Intimate Partner Violence: Not on file    Review of Systems: Pertinent positive and negative review of systems were noted in the above HPI section.  All other review of systems was otherwise negative.   Physical Exam: Vital signs in last 24 hours: Temp:  [93.7 F (34.3 C)-98.7 F (37.1 C)] 98 F (36.7 C) (11/07 0810) Pulse Rate:  [78-123] 91 (11/07 0600) Resp:  [11-22] 11 (11/07 0600) BP: (119-148)/(82-109) 133/88 (11/07 0600) SpO2:  [94 %-100 %] 97 % (11/07 0600) Weight:  [54.1 kg-55 kg] 54.1 kg  (11/07 0100) Last BM Date:  (PTA) General:   Alert,  Well-developed, thin African-American male, pleasant and cooperative in NAD Head:  Normocephalic and atraumatic. Eyes:  Sclera clear, no icterus.   Conjunctiva pink. Ears:  Normal auditory acuity. Nose:  No deformity, discharge,  or lesions. Mouth:  No deformity or lesions.   Neck:  Supple; no masses or thyromegaly. Lungs:  Clear throughout to auscultation.   No wheezes, crackles, or rhonchi.  Heart:  Regular rate and rhythm; no murmurs, clicks, rubs,  or gallops. Abdomen:  Soft, mildly tender across the upper abdomen, no guarding BS active,nonpalp mass or hsm.   Rectal:  Deferred  Msk:  Symmetrical without gross deformities. . Pulses:  Normal pulses noted. Extremities:  Without clubbing or edema. Neurologic:  Alert and  oriented x4;  grossly normal neurologically. Skin:  Intact without significant lesions or rashes.. Psych:  Alert and cooperative. Normal mood and affect.  Intake/Output from previous day: 11/06 0701 - 11/07 0700 In: 1744.9 [I.V.:1552.9; IV Piggyback:192] Out: -  Intake/Output this shift: Total I/O In: -  Out: 550 [Urine:550]  Lab Results: Recent Labs    09/13/21 1401 09/14/21 0610  WBC 9.1 6.8  HGB 16.2 13.1  HCT 47.4 35.9*  PLT 400 250   BMET Recent Labs    09/13/21 2225 09/14/21 0308 09/14/21 0610  NA 129* 131* 130*  K 3.8 3.4* 3.2*  CL 95* 99 101  CO2 _0 GLUCOSE 220* 158* 145*  BUN _1 CREATININE 0.67 0.63 0.52*  CALCIUM 8.9 9.0 8.8*   LFT Recent Labs    09/14/21 0814  PROT 6.2*  ALBUMIN 2.9*  AST 22  ALT 31  ALKPHOS 203*  BILITOT 0.9  BILIDIR 0.2  IBILI 0.7   PT/INR No results for input(s): LABPROT, INR in the last 72 hours. Hepatitis Panel No results for input(s): HEPBSAG, HCVAB, HEPAIGM, HEPBIGM in the last 72 hours.    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 Initial episode of severe pancreatitis November 2021.  Patient had been seen as  an outpatient per Dr. Rush Landmark about 1 month ago with eventual plans for cyst gastrostomy.  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

## 2021-09-14 NOTE — Consult Note (Addendum)
[place]  Telephone:(336) [phone] [phone]   I seen the patient, examined him and edited the notes as follows [place]  Referring MD:  Dr. Kerney Elbe  Reason for Referral: Splenic vein thrombosis  HPI: Mr. Verstraete is a 35 year old male with a past medical history significant for alcoholic pancreatitis with complications of pancreatic pseudocyst and chronic pancreatitis, history of splenic vein thrombosis on anticoagulation with apixaban, history of anxiety.  He presented to the emergency department with abdominal pain, following a fall, and with dizziness.  Abdominal pain was on the left side of his abdomen and has been intermittent for 2 days but had worsened.  He was noted to have a significantly elevated glucose and hyponatremia on admission.  He has had weight loss of 17 to 19 pounds over the past few weeks.  In the emergency department, he was given a fluid bolus and placed on insulin drip.  His hemoglobin was normal on admission but his MCV was elevated at 101.9.  His AST was elevated at 45, alk phos elevated at 331 and T bili was elevated at 2.3.  Last CT abdomen/pelvis was performed on 08/14/2021 which showed interval maturation of large pancreatic pseudocyst and interval splenic vein occlusion with development of enlarged visceral venous collaterals.  He was referred to hematology to discuss anticoagulation for his splenic vein occlusion.  He was scheduled to see me on 11/1, but did not show up for this appointment.  Today, he reports improvement in his abdominal pain.  He has been taking pain medication intermittently.  He has not having any nausea or vomiting.  He was able to eat regular food for breakfast.  No melena or hematochezia reported he is not having any fevers or chills.  He denies chest pain, shortness of breath, cough.  He tells me that he has been taking Eliquis for splenic vein thrombosis for about 1 month.  Denies  family history of VTE or PE.  Denies recent alcohol use.  Hematology was asked see the patient to make recommendations regarding anticoagulation for his splenic vein thrombosis  Past Medical History:  Diagnosis Date   Anxiety    Pancreatitis    Splenic vein thrombosis   :     Past Surgical History:  Procedure Laterality Date   NO PAST SURGERIES    :   CURRENT MEDS: Current Facility-Administered Medications  Medication Dose Route Frequency Provider Last Rate Last Admin   0.9 %  sodium chloride infusion   Intravenous PRN Raiford Noble Latif, DO 10 mL/hr at 09/14/21 0600 Infusion Verify at 09/14/21 0600   0.9 %  sodium chloride infusion   Intravenous PRN Raiford Noble Latif, DO 10 mL/hr at 09/14/21 0600 Infusion Verify at 09/14/21 0600   Chlorhexidine Gluconate Cloth 2 % PADS 6 each  6 each Topical Daily Sheikh, Omair Latif, DO       dextrose 5 % in lactated ringers infusion   Intravenous Continuous Raiford Noble [place], DO 75 mL/hr at 09/14/21 0817 Rate Change at 09/14/21 0817   dextrose 50 % solution 0-50 mL  0-50 mL Intravenous PRN Raiford Noble Latif, DO       dextrose 50 % solution 0-50 mL  0-50 mL Intravenous PRN Raiford Noble Latif, DO       heparin ADULT infusion 100 units/mL (25000 units/235m)  1,200 Units/hr Intravenous Continuous JAngela Adam RPH 12 mL/hr at 09/14/21 0600 1,200 Units/hr at 09/14/21 0600   insulin aspart (novoLOG) injection  0-5 Units  0-5 Units Subcutaneous QHS Sheikh, Omair Latif, DO       insulin aspart (novoLOG) injection 0-9 Units  0-9 Units Subcutaneous TID WC Sheikh, Omair Latif, DO       insulin glargine-yfgn [facility]) injection 10 Units  10 Units Subcutaneous Q24H Sheikh, Omair Latif, DO       insulin regular, human (MYXREDLIN) 100 units/ 100 mL infusion   Intravenous Continuous Raiford Noble [place], DO   Stopped at 09/14/21 1610   lactated ringers bolus 1,100 mL  20 mL/kg Intravenous Once Sheikh, Georgina Quint Latif, DO       lipase/protease/amylase  (CREON) capsule 36,000 Units  36,000 Units Oral TID WC Raiford Noble Latif, DO       multivitamin with minerals tablet 1 tablet  1 tablet Oral Daily Sheikh, Omair Latif, DO       potassium chloride SA (KLOR-CON) CR tablet 40 mEq  40 mEq Oral BID Sheikh, Omair Latif, DO       sodium chloride 0.9 % bolus 1,000 mL  1,000 mL Intravenous Once Sheikh, Omair Latif, DO           Allergies  Allergen Reactions   Other Anaphylaxis    Reaction to boysenberry syrup  :   Family History  Problem Relation Age of Onset   Hypertension Mother    Alcoholism Maternal Uncle    Diabetes Maternal Grandmother    Diabetes Maternal Grandfather    Diabetes Paternal Grandmother    Diabetes Paternal Grandfather    Colon cancer Neg Hx    Esophageal cancer Neg Hx    Inflammatory bowel disease Neg Hx    Liver disease Neg Hx    Pancreatic cancer Neg Hx    Rectal cancer Neg Hx    Stomach cancer Neg Hx   :   Social History   Socioeconomic History   Marital status: Single    Spouse name: Not on file   Number of children: 6   Years of education: Not on file   Highest education level: Not on file  Occupational History   Occupation: forklift driver  Tobacco Use   Smoking status: Every Day    Types: Cigars    Start date: 11/08/2002   Smokeless tobacco: Never   Tobacco comments:    Black and Mild- 2-3 a day, Started at age 53   Vaping Use   Vaping Use: Never used  Substance and Sexual Activity   Alcohol use: Yes    Comment: 1-2 per day   Drug use: Never   Sexual activity: Not on file  Other Topics Concern   Not on file  Social History Narrative   Not on file   Social Determinants of Health   Financial Resource Strain: Not on file  Food Insecurity: Not on file  Transportation Needs: Not on file  Physical Activity: Not on file  Stress: Not on file  Social Connections: Not on file  Intimate Partner Violence: Not on file  :  REVIEW OF SYSTEMS:  A comprehensive 14 point review of systems was  negative except as noted in the HPI.    Exam: Patient Vitals for the past 24 hrs:  BP Temp Temp src Pulse Resp SpO2 Height Weight  09/14/21 0810 -- 98 F (36.7 C) Oral -- -- -- -- --  09/14/21 0600 133/88 -- -- 91 11 97 % -- --  09/14/21 0500 (!) 129/91 -- -- 86 14 100 % -- --  09/14/21 0400 (!) 120/92 98  F (36.7 C) Oral 83 13 96 % -- --  09/14/21 0300 (!) 130/97 -- -- 88 13 100 % -- --  09/14/21 0200 (!) 129/91 -- -- 88 14 100 % -- --  09/14/21 0100 133/82 98.7 F (37.1 C) Oral 98 13 100 % 6' 4.5" (1.943 m) 54.1 kg  09/14/21 0000 (!) 147/90 -- -- 96 11 100 % -- --  09/13/21 2300 122/90 -- -- 97 17 99 % -- --  09/13/21 2115 (!) 133/98 -- -- 100 15 100 % -- --  09/13/21 1928 -- -- -- -- -- -- '6\' 4"'  (1.93 m) 55 kg  09/13/21 1915 119/85 -- -- (!) 109 15 100 % -- --  09/13/21 1630 (!) 133/102 -- -- (!) 107 15 99 % -- --  09/13/21 1615 (!) 142/109 -- -- -- (!) 22 -- -- --  09/13/21 1600 (!) 133/106 -- -- -- 20 99 % -- --  09/13/21 1545 (!) 124/97 -- -- (!) 107 17 100 % -- --  09/13/21 1515 (!) 130/99 -- -- 83 16 94 % -- --  09/13/21 1502 -- (!) 97.2 F (36.2 C) Rectal -- -- -- -- --  09/13/21 1446 (!) 142/108 (!) 93.7 F (34.3 C) -- (!) 123 18 100 % -- --  09/13/21 1315 -- -- -- -- -- -- '6\' 4"'  (1.93 m) --  09/13/21 1304 (!) 148/102 97.7 F (36.5 C) Oral 78 16 99 % -- --    General: Thin, no distress. He looks thin Eyes:  no scleral icterus.   ENT:  There were no oropharyngeal lesions.   Respiratory: Diminished breath sounds. Cardiovascular:  Regular rate and rhythm, S1/S2, without murmur, rub or gallop.  There was no pedal edema.   GI: Positive bowel sounds, soft, mild tenderness to palpation in the epigastric area Musculoskeletal: No clubbing, no joint deformity in the upper or lower extremities.   Skin exam was without ecchymosis, petechiae.   Neuro exam was nonfocal.  Patient was alert and oriented.  Attention was good.   Language was appropriate.  Mood was normal without  depression.  Speech was not pressured.  Thought content was not tangential.    LABS:  Lab Results  Component Value Date   WBC 6.8 09/14/2021   HGB 13.1 09/14/2021   HCT 35.9 (L) 09/14/2021   PLT 250 09/14/2021   GLUCOSE 145 (H) 09/14/2021   CHOL 163 09/26/2020   TRIG 192 (H) 09/30/2020   HDL 76 09/26/2020   LDLCALC 78 09/26/2020   ALT 31 09/14/2021   AST 22 09/14/2021   NA 130 (L) 09/14/2021   K 3.2 (L) 09/14/2021   CL 101 09/14/2021   CREATININE 0.52 (L) 09/14/2021   BUN 15 09/14/2021   CO2 24 09/14/2021   HGBA1C 10.1 (H) 09/14/2021   I have personally reviewed his CT imaging from last month  DG Chest 2 View  Result Date: 09/13/2021 CLINICAL DATA:  Shortness of breath. EXAM: CHEST - 2 VIEW COMPARISON:  Chest radiograph 10/07/2020 FINDINGS: The heart size and mediastinal contours are within normal limits. Hyperinflation. Both lungs are clear. No pleural effusion or pneumothorax. The visualized skeletal structures are unremarkable. IMPRESSION: No active cardiopulmonary disease. Electronically Signed   By: Ileana Roup M.D.   On: 09/13/2021 14:39     ASSESSMENT AND PLAN:  Splenic vein thrombosis, likely chronic, with collaterals Splenic vein thrombosis is the results of chronic pancreatitis and pancreatic pseudocyst Collaterals has been formed; it is likely resulting from  his pancreatitis from a year ago He has been taking Eliquis for about the past month for the splenic vein thrombosis I do not recommend further anticoagulation therapy for him.  His risk of bleeding from varices is very high and the anticoagulation therapy is not likely going to improve his outcome  Chronic alcoholic pancreatitis with pseudocyst He is followed by Dr. Rush Landmark of GI GI is following in the hospital and is considering cyst drainage and possible stent placement; I would defer to GI service for further management  Acute syncopal episode with associated dizziness secondary to dehydration IV  hydration per primary team  Hyperbilirubinemia, transaminitis T bili had trended upward on admission GI considering repeat CT scan and close follow-up of liver function  DKA He was initially started on an insulin drip Now transition to subcu insulin Will need diabetes education  Tobacco dependence Recommend tobacco cessation  He does not need long-term follow-up with hematology.  I will sign off.  Please call if questions arise  Thank you for this referral.  Mikey Bussing, DNP, AGPCNP-BC, AOCNP Heath Lark, MD

## 2021-09-14 NOTE — Progress Notes (Signed)
Inpatient Diabetes Program Recommendations  AACE/ADA: New Consensus Statement on Inpatient Glycemic Control (2015)  Target Ranges:  Prepandial:   less than 140 mg/dL      Peak postprandial:   less than 180 mg/dL (1-2 hours)      Critically ill patients:  140 - 180 mg/dL   Lab Results  Component Value Date   GLUCAP 459 (H) 09/14/2021   HGBA1C 10.1 (H) 09/14/2021    Review of Glycemic Control  Diabetes history: New-onset DM Outpatient Diabetes medications: None Current orders for Inpatient glycemic control: Semglee 10 units Q24H, Novolog 0-15 units TID with meals and 0-5 HS  HgbA1C - 10.1% New-onset DM  Inpatient Diabetes Program Recommendations:    Add Novolog 4 units TID with meals if eating > 50%  Spoke with patient about new diabetes diagnosis.  Discussed A1C results (10.1%) and explained what an A1C is and informed patient that his current A1C indicates an average glucose of 240 mg/dl over the past 2-3 months. Discussed basic pathophysiology of DM Type 2, basic home care, importance of checking CBGs and maintaining good CBG control to prevent long-term and short-term complications. Reviewed glucose and A1C goals. Discussed going home on insulin. Will teach insulin pen administration tomorrow.   Thank you. Ailene Ards, RD, LDN, CDE Inpatient Diabetes Coordinator [phone]

## 2021-09-14 NOTE — Progress Notes (Addendum)
ANTICOAGULATION CONSULT NOTE -   Consult  Pharmacy Consult for IV heparin Indication: DVT  Allergies  Allergen Reactions   Other Anaphylaxis    Reaction to boysenberry syrup    Patient Measurements: Height: 6' 4.5" (194.3 cm) Weight: 54.1 kg (119 lb 4.3 oz) IBW/kg (Calculated) : 87.95 Heparin Dosing Weight: 55 kg  Vital Signs: Temp: 98.7 F (37.1 C) (11/07 0100) Temp Source: Oral (11/07 0100) BP: 130/97 (11/07 0300) Pulse Rate: 88 (11/07 0300)  Labs: Recent Labs    09/13/21 1401 09/13/21 1922 09/13/21 2225 09/14/21 0308  HGB 16.2  --   --   --   HCT 47.4  --   --   --   PLT 400  --   --   --   APTT  --  30  --   --   HEPARINUNFRC  --  <0.10*  --  0.17*  CREATININE 1.24 0.69 0.67 0.63     Estimated Creatinine Clearance: 98.6 mL/min (by C-G formula based on SCr of 0.63 mg/dL).   Medical History: Past Medical History:  Diagnosis Date   Anxiety    Pancreatitis     Medications:  Medications Prior to Admission  Medication Sig Dispense Refill Last Dose   apixaban (ELIQUIS) 5 MG TABS tablet Take 2 tablets (10mg ) twice daily for 7 days, then 1 tablet (5mg ) twice daily (Patient taking differently: Take 5 mg by mouth 2 (two) times daily.) 60 tablet 0 09/12/2021 at 2200   ibuprofen (ADVIL) 200 MG tablet Take 400 mg by mouth every 6 (six) hours as needed for mild pain.   Past Week   lipase/protease/amylase (CREON) 36000 UNITS CPEP capsule Take 2 capsules (72,000 Units total) by mouth 3 (three) times daily with meals. May also take 1 capsule (36,000 Units total) as needed (with snacks). (Patient taking differently: Take 2 capsules (72000 units) by mouth daily with breakfast and supper, take 1 capsule (36000 units) with lunch/midday snack) 240 capsule 11 09/12/2021 at pm   Multiple Vitamins-Minerals (CENTRUM) tablet Take 1 tablet by mouth every morning.   09/12/2021   traMADol (ULTRAM) 50 MG tablet Take 0.5 tablets (25 mg total) by mouth every 12 (twelve) hours as needed for  severe pain. 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 Per med rec states that LD of apixaban was PM of 09/12/2021.  Baseline HL is < 0.10. Question of compliance based on this lab result   Today, 09/14/21  HL 0.17 subtherapeutic on 1000 units/hr No bleeding or interruptions per RN   Goal of Therapy:  Heparin level 0.3-0.7 units/ml Monitor platelets by anticoagulation protocol: Yes   Plan: Heparin 1600 unit bolus x1 Increase heparin drip to 1200 units/hr Heparin level in 6 hours Daily CBC while on heparin Monitor for signs and symptoms of bleeding  13/03/2021 RPh 09/14/2021, 3:57 AM

## 2021-09-14 NOTE — Progress Notes (Signed)
Pt's CBG 484, amion paged MD, new orders see MAR. Will recheck CBG in 1 hour.

## 2021-09-15 DIAGNOSIS — E43 Unspecified severe protein-calorie malnutrition: Secondary | ICD-10-CM | POA: Insufficient documentation

## 2021-09-15 DIAGNOSIS — K859 Acute pancreatitis without necrosis or infection, unspecified: Secondary | ICD-10-CM

## 2021-09-15 LAB — PHOSPHORUS: Phosphorus: 1.5 mg/dL — ABNORMAL LOW (ref 2.5–4.6)

## 2021-09-15 LAB — CBC WITH DIFFERENTIAL/PLATELET
Abs Immature Granulocytes: 0.02 10*3/uL (ref 0.00–0.07)
Basophils Absolute: 0 10*3/uL (ref 0.0–0.1)
Basophils Relative: 0 %
Eosinophils Absolute: 0.2 10*3/uL (ref 0.0–0.5)
Eosinophils Relative: 4 %
HCT: 37.2 % — ABNORMAL LOW (ref 39.0–52.0)
Hemoglobin: 13.2 g/dL (ref 13.0–17.0)
Immature Granulocytes: 0 %
Lymphocytes Relative: 17 %
Lymphs Abs: 0.9 10*3/uL (ref 0.7–4.0)
MCH: 34.6 pg — ABNORMAL HIGH (ref 26.0–34.0)
MCHC: 35.5 g/dL (ref 30.0–36.0)
MCV: 97.6 fL (ref 80.0–100.0)
Monocytes Absolute: 0.7 10*3/uL (ref 0.1–1.0)
Monocytes Relative: 13 %
Neutro Abs: 3.5 10*3/uL (ref 1.7–7.7)
Neutrophils Relative %: 66 %
Platelets: 248 10*3/uL (ref 150–400)
RBC: 3.81 MIL/uL — ABNORMAL LOW (ref 4.22–5.81)
RDW: 11.6 % (ref 11.5–15.5)
WBC: 5.3 10*3/uL (ref 4.0–10.5)
nRBC: 0 % (ref 0.0–0.2)

## 2021-09-15 LAB — GLUCOSE, CAPILLARY
Glucose-Capillary: 256 mg/dL — ABNORMAL HIGH (ref 70–99)
Glucose-Capillary: 170 mg/dL — ABNORMAL HIGH (ref 70–99)
Glucose-Capillary: 208 mg/dL — ABNORMAL HIGH (ref 70–99)
Glucose-Capillary: 89 mg/dL (ref 70–99)

## 2021-09-15 LAB — COMPREHENSIVE METABOLIC PANEL
ALT: 79 U/L — ABNORMAL HIGH (ref 0–44)
AST: 117 U/L — ABNORMAL HIGH (ref 15–41)
Albumin: 3 g/dL — ABNORMAL LOW (ref 3.5–5.0)
Alkaline Phosphatase: 345 U/L — ABNORMAL HIGH (ref 38–126)
Anion gap: 7 (ref 5–15)
BUN: 11 mg/dL (ref 6–20)
CO2: 28 mmol/L (ref 22–32)
Calcium: 8.7 mg/dL — ABNORMAL LOW (ref 8.9–10.3)
Chloride: 96 mmol/L — ABNORMAL LOW (ref 98–111)
Creatinine, Ser: 0.61 mg/dL (ref 0.61–1.24)
GFR, Estimated: >60 mL/min (ref >60)
Glucose, Bld: 211 mg/dL — ABNORMAL HIGH (ref 70–99)
Potassium: 4.5 mmol/L (ref 3.5–5.1)
Sodium: 131 mmol/L — ABNORMAL LOW (ref 135–145)
Total Bilirubin: 1.7 mg/dL — ABNORMAL HIGH (ref 0.3–1.2)
Total Protein: 6.1 g/dL — ABNORMAL LOW (ref 6.5–8.1)

## 2021-09-15 LAB — MAGNESIUM: Magnesium: 1.7 mg/dL (ref 1.7–2.4)

## 2021-09-15 MED ORDER — MAGNESIUM SULFATE 2 GM/50ML IV SOLN
2.0000 g | Freq: Once | INTRAVENOUS | Status: AC
  Administered 2021-09-15: 2 g via INTRAVENOUS
  Filled 2021-09-15: qty 50

## 2021-09-15 MED ORDER — PROSOURCE PLUS PO LIQD
30.0000 mL | Freq: Two times a day (BID) | ORAL | Status: DC
  Administered 2021-09-15 – 2021-09-16 (×2): 30 mL via ORAL
  Filled 2021-09-15 (×2): qty 30

## 2021-09-15 MED ORDER — SODIUM PHOSPHATES 45 MMOLE/15ML IV SOLN
30.0000 mmol | Freq: Once | INTRAVENOUS | Status: AC
  Administered 2021-09-15: 30 mmol via INTRAVENOUS
  Filled 2021-09-15: qty 10

## 2021-09-15 MED ORDER — ENSURE ENLIVE PO LIQD
237.0000 mL | Freq: Two times a day (BID) | ORAL | Status: DC
Start: 2021-09-15 — End: 2021-09-16
  Administered 2021-09-15: 237 mL via ORAL

## 2021-09-15 MED ORDER — INSULIN ASPART 100 UNIT/ML IJ SOLN
4.0000 [IU] | Freq: Three times a day (TID) | INTRAMUSCULAR | Status: DC
  Administered 2021-09-15 – 2021-09-16 (×4): 4 [IU] via SUBCUTANEOUS

## 2021-09-15 MED ORDER — ASCORBIC ACID 500 MG PO TABS
250.0000 mg | ORAL_TABLET | Freq: Every day | ORAL | Status: DC
  Administered 2021-09-15 – 2021-09-16 (×2): 250 mg via ORAL
  Filled 2021-09-15 (×2): qty 1

## 2021-09-15 NOTE — Progress Notes (Signed)
Inpatient Diabetes Program Recommendations  AACE/ADA: New Consensus Statement on Inpatient Glycemic Control (2015)  Target Ranges:  Prepandial:   less than 140 mg/dL      Peak postprandial:   less than 180 mg/dL (1-2 hours)      Critically ill patients:  140 - 180 mg/dL   Lab Results  Component Value Date   GLUCAP 170 (H) 09/15/2021   HGBA1C 10.1 (H) 09/14/2021    Review of Glycemic Control  Diabetes history: New onset DM Outpatient Diabetes medications: N/A Current orders for Inpatient glycemic control: Semglee 10 units Q24H, Novolog 0-15 units TID with meals and 0-5 HS + 4 units TID with meals  HgbA1C - 10.1%  Inpatient Diabetes Program Recommendations:    Increase Semglee to 12 units Q24H  Have attempted to see pt at bedside x 3 today and he's been in the restroom all 3 times. RN states pt was drinking regular soda this morning. Will continue from yesterday with diabetes education and survival skills. Pt has book and nurse states he was reading it. Will need to teach insulin pen administration.  Educated patient on insulin pen use at home. Reviewed contents of insulin flexpen starter kit. Reviewed all steps if insulin pen including attachment of needle, 2-unit air shot, dialing up dose, giving injection, removing needle, disposal of sharps, storage of unused insulin, disposal of insulin etc. Patient able to provide successful return demonstration. Also reviewed troubleshooting with insulin pen. MD to give patient Rxs for insulin pens and insulin pen needles.  Reviewed survival skills and answered questions.  Will f/u in am.   Thank you. Lorenda Peck, RD, LDN, CDE Inpatient Diabetes Coordinator [phone]

## 2021-09-15 NOTE — Progress Notes (Signed)
PROGRESS NOTE    Carlos [name]  IHK:[id number] DOB: November 06, 1986 DOA: 09/13/2021 PCP: Merryl Hacker, No   Brief Narrative:  Carlos [name] is a 35 y.o. 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  Patient presented to the ED with a chief complaint of abdominal pain, passing out and some mild shortness of breath.  He states he started having left-sided abdominal pain intermittent for last 2 days but worsened.  He has a history of pancreatitis that feels similar in the past.  He reports he had a syncopal episode when he tried to get out of bed and states when he stood up he fell but did not hit his head.  He denies any chest pain but did endorse feeling shortness of breath for last 2 days which is now improved.  Because of his fall, his significant other brought him to the ED for further evaluation And found to have a significantly elevated blood glucose and hyponatremia.  Patient's lipase level was also elevated and he was hyperbilirubinemic.  He endorses weight loss of 17 to 19 pounds in last few weeks, polyuria, polydipsia and had some epigastric right upper quadrant abdominal pain with nausea which is now improving.  TRH was asked admit this patient for hyperglycemia in the setting of DKA with a syncopal episode.   ED Course: In The ED he was given IV fluid hydration and given 2 L of boluses and then placed on a insulin drip and then maintenance IV fluids.  He basic blood work done including a CBC and CMP.  Gastroenterology was consulted for further evaluation recommendations COVID testing was not done yet and still pending  **Interim History His DKA improved and is transition to long-acting insulin with Semglee.   Patient drank a Sprite and so his blood sugars was elevated yesterday  Diabetes education Coordinator evaluated and giving the patient Diabetic Education and recommending increasing Semglee to 12 units.  GI has evaluated and recommending a CT scan of the abdomen pelvis and medical oncology/hematology was consulted for splenic vein thrombosis and recommends no further anticoagulation and have discontinued his heparin drip  CT scan of the abdomen pelvis showed significant interval worsening with increased retroperitoneal edema consistent with acute changes of pancreatitis as well as a progressive pseudocyst formation and a new 5.7 cm collection medial to the spleen and gastric fundus with multiple small fluid collections in the area consistent with a developing pseudocyst and possibility of abscess.  He was also noted to have erosive gallbladder dilatation and diffuse steatosis.  GI recommending a low-fat diet from a pancreas standpoint with diabetic restrictions and they are recommending tramadol 50 mg every 4 to 6 hours as needed.  There is no education for cyst gastrostomy currently and they will need to wait for his acute episode of pancreatitis to resolve and new areas of pseudocyst formation to mature.  Assessment & Plan:   Active Problems:   DKA (diabetic ketoacidosis) (HCC)   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)   Protein-calorie malnutrition, severe   Hyperglycemia with new Onset Diabetes and DKA, poA High anion gap metabolic acidosis SIRS secondary to DKA from acute pancreatitis -Admit to inpatient stepdown unit now transferred to the medical floor -He presents with a CO2 of 11,  anion gap of 27, and a chloride level of 87 -VBG done and showed a pH of 7.192, PCO2 of 37.3, PO2 of 32.8, bicarbonate level of 13.7, and O2 saturation 45.9 and a temperature 98.6 -Patient met SIRS criteria on admission given that he was tachypneic and tachycardic but has no source of infection but  was having an acute pancreatitis flare -Does not have a history of diabetes but reports recent weight loss of 17 to 19 pounds in last few weeks, polyuria and polydipsia -Has a history of pancreatitis and has intermittent epigastric pain right upper quadrant abdominal pain with nausea and CT findings were consistent for an acute pancreatitis episode -Had a syncopal episode this morning after feeling dizzy likely in the setting of massive dehydration -We will keep the patient n.p.o. for now given his abdominal pain and allow for bowel rest -He was given IV morphine 8 mg once in the ED and had resumed IV morphine 2 mg every 3 as needed; GI is added tramadol 50 mg 4 to 6 hours as needed -Initiated on IV fluid hydration and given 2 L of LR in the ED and placed on maintenance IV fluid hydration at 125 an hour -Changed to D5 and LR at 125 MLS per hour once CBGs less than 250 -Initiated DKA protocol with Endo tool and insulin drip but will change to Semglee 10 units as we have started him on a diet now; will increase Semglee to 12 units -Also added moderate NovoLog sliding scale insulin AC and at bedtime and 4 units of Premeal insulin 3 times daily -Check beta hydroxybutyric acid and was elevated at 2.62 and trended down to 0.17 -Trended BMPs every 4 -Checked hemoglobin A1c and was 10.1 -Check blood cultures x2 and UDS; UDS was negative but blood cultures were never obtained -CO2 is now 28, anion gap is 7, chloride level is 96 -CBGs are now ranging from 128-459; elevated in the setting of Sprite -Continue with antiemetics and add ondansetron p.o./IV every 6 as needed for nausea vomiting -Chest x-ray showed no acute cardiopulmonary disease -We will need diabetes education coordinator for further evaluation as well as nutritionist as patient drank a Sprite; diabetes education coordinator has been consulted for diabetic education and insulin pen teaching and will follow up with the patient in the morning    Acute syncopal episode with associated dizziness in the setting of massive dehydration from DKA as above from orthostatic hypotension -Patient reported syncopal episode today when he was getting out of his bed and did not report hitting his head -Likely patient was orthostatic -We will continue IV fluid hydration as above and continue to monitor carefully -Placed on fall precautions -Repeat orthostatic vital signs in the morning and they were not done yet  Hypokalemia -Improved.  Patient's potassium went from 3.2 and went to 3.8 and is now further improved to 4.5 -Continue to monitor and replete as necessary -Repeat CMP in a.m.   Pseudohyponatremia compounded with hyponatremia -Patient's sodium was 125 and his blood glucose was 715 on admission;  -Repeat sodium this morning was 131 with a blood sugar of 211; corrected his sodium is 133 twice twice daily Caryl Comes and 138 via the [place] with IV fluid hydration as above and now stopped Continue to monitor and trend and repeat CMP in a.m. and monitor sodium and blood glucose carefully   Macrocytosis without evidence of anemia; macrocytosis is improved -Likely he was hemoconcentrated on admission and hemoglobin/hematocrit will drop with IV  fluid resuscitation -Patient's hemoglobin/hematocrit went from 16.2/47.4 and is trended down to 13.1/35.9 and is now 13.2/37.2 -Continue to monitor for signs and symptoms of -Repeat CBC in a.m.   Acute on chronic chronic alcoholic pancreatitis with pseudocyst and splenic vein thrombosis Acute on chronic abdominal pain in the setting of acute pancreatitis -His lipase level was elevated and went from 63 and trended up to 122 -CT scan of the abdomen pelvis done last month and showed "The previously demonstrated peripancreatic fluid has resolved. Interval development of well-marginated pseudocyst cephalad to the pancreatic tail, abutting the gastric cardia; and a 7.2 cm pseudocyst  inferior to the pancreatic head. Scattered coarse pancreatic parenchymal calcifications in the head and neck region. No discrete mass or ductal dilatation." -Sees Dr. Rush Landmark in the outpatient setting and the plan was to get a cyst drained and possibly have stents placed -Gastroenterology has been consulted by the EDP and they are obtaining a CT of the abdomen pelvis with contrast to further evaluate -Repeat CT scan here showed "Significant increase in retroperitoneal edema, most likely due to interval acute changes of pancreatitis.  Progressive pseudocyst formation, as described above. The only incompletely brand new fluid collection is medial to the spleen and gastric fundus with multiple smaller fluid collections in that area with mild stranding in the adjacent edematous tissues. This is also most likely a developing pseudocyst but the possibility of abscess formation would need to be excluded clinically. Mildly progressive gallbladder dilatation. Moderate amount of pelvic ascites. Diffuse hepatic steatosis." -GI recommending continuing to eat solid food without difficulty and continue diabetic diet -C/w Creon 36,000 units TIDwm -She had now recommending a low-fat diet from pancreatitis standpoint with diabetic restrictions and they have ordered the patient tramadol 50 mg 4 to 6 hours as needed for pain and they feel he has no indication for cyst gastrostomy presently and they will need to wait for the acute episode of pancreatitis to resolve and for the new areas of pseudocyst formation to mature -Patient is to follow-up with Dr. Stefani Dama Roddy in the outpatient setting as GI recommend conservative care pancreatic issues at this time   Hyperbilirubinemia -Patient's T bili trended up and went from 1.5 and trended up to 2.3 and is now trended down and normalized at 0.9 yesterday but now trended back up to one-point -May consider repeat CT scan here but we will follow-up on GI  recommendations -Continue with IV fluid resuscitation repeat CMP in a.m.  Abnormal LFTs -Mild and slightly worsening Patient's ALT went from 19 and trended up to 45 and is now trended down to 31 but is now trended up to 79 -AST was normal and then trended up from 22 and is 117 -Continue monitor and trend and consider right upper quadrant ultrasound and acute hepatitis panel in the a.m. if still persistently elevated -GI has been consulted for further evaluation recommendation and they have signed off the patient's case  Hypophosphatemia -Patient's phosphorus level is 1.5 -Replete with IV sodium-Phos 30 mmol -Continue to monitor and Replete as Necessary -Repeat Phos Level in the AM    Splenic Vein Thrombosis  -Hold Home Apixaban and started Heparin gtt -Patient has collaterals noted informed and this is result of his chronic pancreatitis and pancreatic pseudocyst.  He is taking Eliquis for last month for the splenic vein thrombosis -Medical hematology consulted and they do not recommend any further anticoagulation given her as risk of bleeding from varices is high and that anticoagulation is not likely to improve  his outcome   Tobacco Abuse  -Counseling given  Severe malnutrition in the context of acute illness/injury/underweight -Nutritionist consulted for further evaluation recommendations and recommending Ensure Enlive p.o. twice daily, 250 mg of p.o. vitamin C daily, as well as Prosource plus p.o. twice daily and the provided the patient with carbohydrate counting handout  DVT prophylaxis: Was anticoagulated with a heparin drip but will stop Code Status: FULL CODE Family Communication: Discussed with Significant Other at bedside  Disposition Plan: Pending further clinical improvement and clearance by GI as well as evaluation by the diabetes education coordinator  Status is: Inpatient  Remains inpatient appropriate because: GI is evaluating and he will need diabetic  education  Consultants:  Gastroenterology  Diabetes Education Coordinator   Procedures:  None  Antimicrobials:  Anti-infectives (From admission, onward)    None        Subjective: Seen and examined at bedside and he was still having some abdominal pain.  Not as bad.  Had no nausea or vomiting but drinks but yesterday which raises blood sugars.  Labs are little worse today.  GI feels that he needs to get over his acute pancreatitis episode before they can do anything and any intervention for his new pseudocysts.  Encourage p.o. intake.  Continue monitor labs carefully and diabetes education coordinator is providing information about insulin teaching.  Objective: Vitals:   09/14/21 1938 09/15/21 0348 09/15/21 0800 09/15/21 1200  BP: (!) 123/96 (!) 126/94 (!) 128/91 (!) 124/92  Pulse: 100 96 98 92  Resp: _0 Temp: 97.8 F (36.6 C) 98 F (36.7 C) 98.1 F (36.7 C) 98 F (36.7 C)  TempSrc: Axillary Oral Oral Oral  SpO2: 100% 100% 100% 100%  Weight:      Height:        Intake/Output Summary (Last 24 hours) at 09/15/2021 1701 Last data filed at 09/15/2021 1532 Gross per 24 hour  Intake 110.9 ml  Output --  Net 110.9 ml    Filed Weights   09/13/21 1928 09/14/21 0100  Weight: 55 kg 54.1 kg   Examination: Physical Exam:  Constitutional: Patient is a thin African-American male currently in no acute distress appears calm but is complaining of some abdominal pain Eyes: Lids and conjunctivae normal, sclerae anicteric  ENMT: External Ears, Nose appear normal. Grossly normal hearing. Mucous membranes are moist.  Neck: Appears normal, supple, no cervical masses, normal ROM, no appreciable thyromegaly; no appreciable JVD Respiratory: Diminished to auscultation bilaterally, no wheezing, rales, rhonchi or crackles. Normal respiratory effort and patient is not tachypenic. No accessory muscle use.  Cardiovascular: Slightly tachycardic rate and regular rhythm, no murmurs /  rubs / gallops. S1 and S2 auscultated. Abdomen: Soft, tender to palpate, non-distended. Bowel sounds positive.  GU: Deferred. Musculoskeletal: No clubbing / cyanosis of digits/nails. No joint deformity upper and lower extremities.  Skin: No rashes, lesions, ulcers on limited skin evaluation. No induration; Warm and dry.  Neurologic: CN 2-12 grossly intact with no focal deficits. Romberg sign and cerebellar reflexes not assessed.  Psychiatric: Normal judgment and insight. Alert and oriented x 3. Normal mood and appropriate affect.   Data Reviewed: I have personally reviewed following labs and imaging studies  CBC: Recent Labs  Lab 09/13/21 1401 09/14/21 0610 09/15/21 0449  WBC 9.1 6.8 5.3  NEUTROABS 7.8*  --  3.5  HGB 16.2 13.1 13.2  HCT 47.4 35.9* 37.2*  MCV 101.9* 96.2 97.6  PLT 400 250 250    Basic Metabolic Panel:  Recent Labs  Lab 09/14/21 0308 09/14/21 0610 09/14/21 1011 09/14/21 1439 09/15/21 0449  NA 131* 130* 131* 128* 131*  K 3.4* 3.2* 3.8 3.7 4.5  CL 99 101 96* 94* 96*  CO2 _0 GLUCOSE 158* 145* 381* 402* 211*  BUN _1 CREATININE 0.63 0.52* 0.60* 0.81 0.61  CALCIUM 9.0 8.8* 8.8* 8.7* 8.7*  MG  --   --   --   --  1.7  PHOS  --   --   --   --  1.5*    GFR: Estimated Creatinine Clearance: 98.6 mL/min (by C-G formula based on SCr of 0.61 mg/dL). Liver Function Tests: Recent Labs  Lab 09/13/21 1401 09/14/21 0814 09/15/21 0449  AST 21 22 117*  ALT 45* 31 79*  ALKPHOS 331* 203* 345*  BILITOT 2.3* 0.9 1.7*  PROT 8.9* 6.2* 6.1*  ALBUMIN 4.5 2.9* 3.0*    Recent Labs  Lab 09/13/21 1401  LIPASE 122*    No results for input(s): AMMONIA in the last 168 hours. Coagulation Profile: No results for input(s): INR, PROTIME in the last 168 hours. Cardiac Enzymes: No results for input(s): CKTOTAL, CKMB, CKMBINDEX, TROPONINI in the last 168 hours. BNP (last 3 results) No results for input(s): PROBNP in the last 8760  hours. HbA1C: Recent Labs    09/14/21 0610  HGBA1C 10.1*    CBG: Recent Labs  Lab 09/14/21 1927 09/14/21 2015 09/15/21 0810 09/15/21 1216 09/15/21 1647  GLUCAP 320* 317* 256* 170* 208*    Lipid Profile: No results for input(s): CHOL, HDL, LDLCALC, TRIG, CHOLHDL, LDLDIRECT in the last 72 hours. Thyroid Function Tests: No results for input(s): TSH, T4TOTAL, FREET4, T3FREE, THYROIDAB in the last 72 hours. Anemia Panel: No results for input(s): VITAMINB12, FOLATE, FERRITIN, TIBC, IRON, RETICCTPCT in the last 72 hours. Sepsis Labs: No results for input(s): PROCALCITON, LATICACIDVEN in the last 168 hours.  Recent Results (from the past 240 hour(s))  Resp Panel by RT-PCR (Flu A&B, Covid) Nasopharyngeal Swab     Status: None   Collection Time: 09/13/21  5:22 PM   Specimen: Nasopharyngeal Swab; Nasopharyngeal(NP) swabs in vial transport medium  Result Value Ref Range Status   SARS Coronavirus 2 by RT PCR NEGATIVE NEGATIVE Final    Comment: (NOTE) SARS-CoV-2 target nucleic acids are NOT DETECTED.  The SARS-CoV-2 RNA is generally detectable in upper respiratory specimens during the acute phase of infection. The lowest concentration of SARS-CoV-2 viral copies this assay can detect is 138 copies/mL. A negative result does not preclude SARS-Cov-2 infection and should not be used as the sole basis for treatment or other patient management decisions. A negative result may occur with  improper specimen collection/handling, submission of specimen other than nasopharyngeal swab, presence of viral mutation(s) within the areas targeted by this assay, and inadequate number of viral copies(<138 copies/mL). A negative result must be combined with clinical observations, patient history, and epidemiological information. The expected result is Negative.  Fact Sheet for Patients:  EntrepreneurPulse.com.au  Fact Sheet for Healthcare Providers:   IncredibleEmployment.be  This test is no t yet approved or cleared by the [place] FDA and  has been authorized for detection and/or diagnosis of SARS-CoV-2 by FDA under an Emergency Use Authorization (EUA). This EUA will remain  in effect (meaning this test can be used) for the duration of the COVID-19 declaration under Section 564(b)(1) of the Act, 21 U.S.C.section 360bbb-3(b)(1), unless the authorization is terminated  or revoked  sooner.       Influenza A by PCR NEGATIVE NEGATIVE Final   Influenza B by PCR NEGATIVE NEGATIVE Final    Comment: (NOTE) The Xpert Xpress SARS-CoV-2/FLU/RSV plus assay is intended as an aid in the diagnosis of influenza from Nasopharyngeal swab specimens and should not be used as a sole basis for treatment. Nasal washings and aspirates are unacceptable for Xpert Xpress SARS-CoV-2/FLU/RSV testing.  Fact Sheet for Patients: EntrepreneurPulse.com.au  Fact Sheet for Healthcare Providers: IncredibleEmployment.be  This test is not yet approved or cleared by the [place] FDA and has been authorized for detection and/or diagnosis of SARS-CoV-2 by FDA under an Emergency Use Authorization (EUA). This EUA will remain in effect (meaning this test can be used) for the duration of the COVID-19 declaration under Section 564(b)(1) of the Act, 21 U.S.C. section 360bbb-3(b)(1), unless the authorization is terminated or revoked.  Performed at [facility], [place] [address] [place], [place] [address]   MRSA Next Gen by PCR, Nasal     Status: None   Collection Time: 09/14/21 12:08 AM   Specimen: Nasal Mucosa; Nasal Swab  Result Value Ref Range Status   MRSA by PCR Next Gen NOT DETECTED NOT DETECTED Final    Comment: (NOTE) The GeneXpert MRSA Assay (FDA approved for NASAL specimens only), is one component of a comprehensive MRSA colonization surveillance program. It is not intended  to diagnose MRSA infection nor to guide or monitor treatment for MRSA infections. Test performance is not FDA approved in patients less than 5 years old. Performed at [facility], [place] [address] [place], [place] [address]     RN Pressure Injury Documentation:     Estimated body mass index is 14.33 kg/m as calculated from the following:   Height as of this encounter: 6' 4.5" (1.943 m).   Weight as of this encounter: 54.1 kg.  Malnutrition Type: Nutrition Problem: Severe Malnutrition Etiology: acute illness (uncontrolled diabetes) Malnutrition Characteristics: Signs/Symptoms: percent weight loss, moderate fat depletion, severe muscle depletion Nutrition Interventions: Interventions: Ensure Enlive (each supplement provides 350kcal and 20 grams of protein), MVI, Prostat, Education  Radiology Studies: CT ABDOMEN PELVIS W CONTRAST  Result Date: 09/14/2021 CLINICAL DATA:  Mid abdominal pain for 1 week in 20 lb weight loss in 2 weeks. Persistent pancreatic pseudocysts related to previous pancreatitis. EXAM: CT ABDOMEN AND PELVIS WITH CONTRAST TECHNIQUE: Multidetector CT imaging of the abdomen and pelvis was performed using the standard protocol following bolus administration of intravenous contrast. CONTRAST:  42m OMNIPAQUE IOHEXOL 350 MG/ML SOLN COMPARISON:  08/14/2021 FINDINGS: Lower chest: Unremarkable. Hepatobiliary: Mildly progressive dilatation of the gallbladder with no gallstones or wall thickening seen. Diffuse low density of the liver relative to the spleen without significant change. Pancreas: The previously demonstrated 7.4 cm pancreatic tail pseudocyst is smaller, currently measuring 6.1 cm. The previously demonstrated 7.2 cm pseudocyst inferior to the pancreatic head is larger, measuring 8.1 cm with an interval separate lateral extension measuring 3.9 cm. Interval 5.7 cm similar-appearing fluid collection medial to the spleen and gastric fundus with multiple  small, similar appearing new fluid collections in the adjacent edematous tissues. Multiple small pancreatic calcifications are again demonstrated. Spleen: Normal in size without focal abnormality. Adrenals/Urinary Tract: Adrenal glands are unremarkable. Kidneys are normal, without renal calculi, focal lesion, or hydronephrosis. Bladder is unremarkable. Stomach/Bowel: Stomach is within normal limits. Appendix appears normal. No evidence of bowel wall thickening, distention, or inflammatory changes. Vascular/Lymphatic: Previously noted splenic vein occlusion with associated collaterals. No  interval significant vascular findings are present. No enlarged abdominal or pelvic lymph nodes. Reproductive: Prostate is unremarkable. Other: Significant progression and retroperitoneal edema. No interval moderate amount of free peritoneal fluid in the pelvis. Musculoskeletal: Unremarkable bones. IMPRESSION: 1. Significant increase in retroperitoneal edema, most likely due to interval acute changes of pancreatitis. 2. Progressive pseudocyst formation, as described above. The only incompletely brand new fluid collection is medial to the spleen and gastric fundus with multiple smaller fluid collections in that area with mild stranding in the adjacent edematous tissues. This is also most likely a developing pseudocyst but the possibility of abscess formation would need to be excluded clinically. 3. Mildly progressive gallbladder dilatation. 4. Moderate amount of pelvic ascites. 5. Diffuse hepatic steatosis. Electronically Signed   By: Claudie Revering M.D.   On: 09/14/2021 16:50    Scheduled Meds:  (feeding supplement) PROSource Plus  30 mL Oral BID BM   vitamin C  250 mg Oral Daily   feeding supplement  237 mL Oral BID BM   insulin aspart  0-15 Units Subcutaneous TID WC   insulin aspart  0-5 Units Subcutaneous QHS   insulin aspart  4 Units Subcutaneous TID WC   insulin glargine-yfgn  10 Units Subcutaneous Q24H    lipase/protease/amylase  36,000 Units Oral TID WC   multivitamin with minerals  1 tablet Oral Daily   potassium chloride  40 mEq Oral BID   Continuous Infusions:  sodium chloride Stopped (09/14/21 0815)   sodium chloride Stopped (09/14/21 1445)   dextrose 5% lactated ringers Stopped (09/14/21 1445)   lactated ringers     sodium chloride     sodium phosphate  Dextrose 5% IVPB 43 mL/hr at 09/15/21 1532    LOS: 2 days   Kerney Elbe, DO [facility] PAGER is on AMION  If 7PM-7AM, please contact night-coverage [email or web address]

## 2021-09-15 NOTE — Progress Notes (Addendum)
Patient ID: Carlos [name], male   DOB: [birth year], 35 y.o.   MRN: [id number]    Progress Note   Subjective   Day # 2  CC; history of chronic pancreatitis, chronic pancreatic pseudocysts, chronic splenic vein thrombosis, new onset diabetes with DKA  CT abdomen and pelvis 09/14/2021-significant interval worsening since CT 1 month ago, increase in retroperitoneal edema consistent with acute changes of pancreatitis, progressive pseudocyst formation-new collection 5.7 cm, medial to the spleen and gastric fundus with multiple smaller fluid collections in that area consistent with a developing pseudocyst/possibility of abscess needs to be excluded clinically, erosive gallbladder dilation, diffuse steatosis  Labs-WBC 5.8/hemoglobin 13.2 Glucose 402 early this a.m.> 211/creatinine 0.61  LFTs up-T bili 1.7/alk phos 345/AST 117/ALT 79  Patient appears comfortable, eating bacon and eggs.  He says as long as he does not overeat that he does not have any increase in abdominal pain with eating.  He is not having any nausea or vomiting currently. Awaiting diabetes diet education etc. today.  Confirmed with patient again today he has not been using any EtOH since December 2021     Objective   Vital signs in last 24 hours: Temp:  [97.8 F (36.6 C)-98.1 F (36.7 C)] 98.1 F (36.7 C) (11/08 0800) Pulse Rate:  [94-100] 98 (11/08 0800) Resp:  [17-20] 18 (11/08 0800) BP: (123-140)/(91-101) 128/91 (11/08 0800) SpO2:  [100 %] 100 % (11/08 0800) Last BM Date:  (PTA) General:    [name] in NAD Heart:  Regular rate and rhythm; no murmurs Lungs: Respirations even and unlabored, lungs CTA bilaterally Abdomen:  Soft, mild tenderness across the upper abdomen, no guarding or rebound ,nondistended. Normal bowel sounds. Extremities:  Without edema. Neurologic:  Alert and oriented,  grossly normal neurologically. Psych:  Cooperative. Normal mood and affect.    Lab Results: Recent Labs     09/13/21 1401 09/14/21 0610 09/15/21 0449  WBC 9.1 6.8 5.3  HGB 16.2 13.1 13.2  HCT 47.4 35.9* 37.2*  PLT 400 250 248   BMET Recent Labs    09/14/21 1011 09/14/21 1439 09/15/21 0449  NA 131* 128* 131*  K 3.8 3.7 4.5  CL 96* 94* 96*  CO2 '24 26 28  ' GLUCOSE 381* 402* 211*  BUN '15 15 11  ' CREATININE 0.60* 0.81 0.61  CALCIUM 8.8* 8.7* 8.7*   LFT Recent Labs    09/14/21 0814 09/15/21 0449  PROT 6.2* 6.1*  ALBUMIN 2.9* 3.0*  AST 22 117*  ALT 31 79*  ALKPHOS 203* 345*  BILITOT 0.9 1.7*  BILIDIR 0.2  --   IBILI 0.7  --    Studies/Results:   CT ABDOMEN PELVIS W CONTRAST  Result Date: 09/14/2021 CLINICAL DATA:  Mid abdominal pain for 1 week in 20 lb weight loss in 2 weeks. Persistent pancreatic pseudocysts related to previous pancreatitis. EXAM: CT ABDOMEN AND PELVIS WITH CONTRAST TECHNIQUE:  IMPRESSION: 1. Significant increase in retroperitoneal edema, most likely due to interval acute changes of pancreatitis. 2. Progressive pseudocyst formation, as described above. The only incompletely brand new fluid collection is medial to the spleen and gastric fundus with multiple smaller fluid collections in that area with mild stranding in the adjacent edematous tissues. This is also most likely a developing pseudocyst but the possibility of abscess formation would need to be excluded clinically. 3. Mildly progressive gallbladder dilatation. 4. Moderate amount of pelvic ascites. 5. Diffuse hepatic steatosis. Electronically Signed   By: Claudie Revering M.D.   On: 09/14/2021 16:50  Assessment / Plan:    #67 35 year old African-American male with chronic calcific pancreatitis secondary to long history of EtOH abuse (abstinent x11 months).  Patient developed chronic pancreatic pseudocyst after initial episode of severe pancreatitis November 2021.  Well marginated as of CT scan 08/14/2021. He was being considered for cystogastrostomy per Dr. Rush Landmark when seen as an outpatient 1 month  ago.. Patient had developed increasing abdominal pain over the past 1 to 2 weeks though no nausea or vomiting and had been able to eat.  Repeat CT scan yesterday unfortunately shows evidence of acute pancreatitis with increasing retroperitoneal edema and progressive pseudocyst formation with new areas of pseudocyst development, progressive gallbladder dilation. One of the chronic pseudocyst is decreased in size and the other is moderately increased in size.   No evidence clinically for pancreatic abscess  #2 splenic vein thrombosis-appears chronic with collaterals on CT.  He had been started on Eliquis by ER MD on 08/14/2021.  He has been seen by hematology and they advised no anticoagulation indicated at this time  #3 severe hyperglycemia/metabolic acidosis consistent with new onset diabetes with DKA Management as per hospitalist Query if this has been triggered by chronic pancreatitis  #4 weight loss secondary to #3  Plan; low-fat diet from pancreatitis standpoint with diabetic restrictions Will order tramadol 50 mg every 4-6 hours as needed for pain, he had been using tramadol very sparingly at home No indication for cyst gastrostomy presently will need to wait for the acute episode of pancreatitis to resolve, and the new areas of pseudocyst formation to mature.  We will need office follow-up with Dr. Rush Landmark -we will arrange    LOS: 2 days   Amy Esterwood PA-C 09/15/2021, 9:00 AM    [facility] Attending   I have taken an interval history, reviewed the chart and examined the patient. I agree with the Advanced Practitioner's note, impression and recommendations.  Majority the medical decision-making in the formulation of the assessment and plan were performed by me.  DKA and DM seem to be main issue now. He appropriately did not drink Ensure w/ 20 g sugar in bottle  We will sign off - available if needed. Conservative care of pancreatic issues at this time.  Gatha Mayer,  MD, [facility] 09/15/2021 5:05 PM

## 2021-09-15 NOTE — Progress Notes (Signed)
Initial Nutrition Assessment  DOCUMENTATION CODES:   Severe malnutrition in context of acute illness/injury, Underweight  INTERVENTION:   -Ensure Enlive po BID, each supplement provides 350 kcal and 20 grams of protein  -250 mg Vitamin C daily  -Provided "Carbohydrate Counting" handout and reviewed with patient  -Prosource Plus PO BID, each provides 100 kcals and 15g protein   NUTRITION DIAGNOSIS:   Severe Malnutrition related to acute illness (uncontrolled diabetes) as evidenced by percent weight loss, moderate fat depletion, severe muscle depletion.  GOAL:   Patient will meet greater than or equal to 90% of their needs  MONITOR:   PO intake, Supplement acceptance, Labs, Weight trends, I & O's  REASON FOR ASSESSMENT:   Consult Diet education  ASSESSMENT:   35 y.o. 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  Patient presented to the ED with a chief complaint of abdominal pain, passing out and some mild shortness of breath.  Patient in room, states he is eating and has never stopped eating even PTA. Pt was still eating but with uncontrolled blood sugars. Reports nausea but no vomiting PTA. Typically consumed 2 meals with 1 snack. Was drinking Boost supplements with meals so Creon would cover. Was taking Creon PTA. Reports no alcohol use. Takes a daily MVI at home.  Gave patient Carbohydrate counting handout, reviewed carbohydrate amounts to have with each meal. Encouraged consistent intakes and no skipping meals. Pt can continue supplements with meals but will need to count these in total carbohydrate allowance.   Per weight records, pt has lost 19 lbs since 10/13  (13% wt loss x <1 month, significant for time frame). Pt weighed ~172 lbs in November 2021. Pt states he feels weaker in his BLEs.  Medications: Creon, Multivitamin with minerals daily, KLOR-CON, sodium phos infusion  Labs reviewed:  CBGs: 256-484 Low Na, Phos  NUTRITION - FOCUSED PHYSICAL EXAM:  Flowsheet Row Most Recent Value  Orbital Region Mild depletion  Upper Arm Region Severe depletion  Thoracic and Lumbar Region Unable to assess  Buccal Region Moderate depletion  Temple Region Severe depletion  Clavicle Bone Region Severe depletion  Clavicle and Acromion Bone Region Severe depletion  Scapular Bone Region Severe depletion  Dorsal Hand Mild depletion  Patellar Region Severe depletion  Anterior Thigh Region Severe depletion  Posterior Calf Region Severe depletion  Edema (RD Assessment) None  Hair Reviewed  Eyes Reviewed  Mouth Reviewed  Skin Reviewed       Diet Order:   Diet Order             Diet Carb Modified Fluid consistency: Thin; Room service appropriate? Yes  Diet effective now                   EDUCATION NEEDS:   Education needs have been addressed  Skin:  Skin Assessment: Reviewed RN Assessment  Last BM:  PTA  Height:   Ht Readings from Last 1 Encounters:  09/14/21 6' 4.5" (1.943 m)    Weight:   Wt Readings from Last 1 Encounters:  09/14/21 54.1 kg     BMI:  Body mass index is 14.33 kg/m.  Estimated Nutritional Needs:   Kcal:  2600-2800  Protein:  125-140g  Fluid:  2.6L/day  Clayton Bibles, MS, RD, LDN Inpatient Clinical Dietitian Contact information available via Amion

## 2021-09-16 ENCOUNTER — Telehealth: Payer: Self-pay

## 2021-09-16 DIAGNOSIS — F1020 Alcohol dependence, uncomplicated: Secondary | ICD-10-CM

## 2021-09-16 DIAGNOSIS — R7401 Elevation of levels of liver transaminase levels: Secondary | ICD-10-CM

## 2021-09-16 DIAGNOSIS — E43 Unspecified severe protein-calorie malnutrition: Secondary | ICD-10-CM

## 2021-09-16 LAB — CBC WITH DIFFERENTIAL/PLATELET
Abs Immature Granulocytes: 0.03 10*3/uL (ref 0.00–0.07)
Basophils Absolute: 0 10*3/uL (ref 0.0–0.1)
Basophils Relative: 0 %
Eosinophils Absolute: 0.1 10*3/uL (ref 0.0–0.5)
Eosinophils Relative: 3 %
HCT: 32.5 % — ABNORMAL LOW (ref 39.0–52.0)
Hemoglobin: 11.4 g/dL — ABNORMAL LOW (ref 13.0–17.0)
Immature Granulocytes: 1 %
Lymphocytes Relative: 16 %
Lymphs Abs: 0.8 10*3/uL (ref 0.7–4.0)
MCH: 34.7 pg — ABNORMAL HIGH (ref 26.0–34.0)
MCHC: 35.1 g/dL (ref 30.0–36.0)
MCV: 98.8 fL (ref 80.0–100.0)
Monocytes Absolute: 0.6 10*3/uL (ref 0.1–1.0)
Monocytes Relative: 12 %
Neutro Abs: 3.4 10*3/uL (ref 1.7–7.7)
Neutrophils Relative %: 68 %
Platelets: 227 10*3/uL (ref 150–400)
RBC: 3.29 MIL/uL — ABNORMAL LOW (ref 4.22–5.81)
RDW: 11.6 % (ref 11.5–15.5)
WBC: 5 10*3/uL (ref 4.0–10.5)
nRBC: 0 % (ref 0.0–0.2)

## 2021-09-16 LAB — GLUCOSE, CAPILLARY
Glucose-Capillary: 203 mg/dL — ABNORMAL HIGH (ref 70–99)
Glucose-Capillary: 177 mg/dL — ABNORMAL HIGH (ref 70–99)

## 2021-09-16 LAB — COMPREHENSIVE METABOLIC PANEL
ALT: 162 U/L — ABNORMAL HIGH (ref 0–44)
AST: 343 U/L — ABNORMAL HIGH (ref 15–41)
Albumin: 2.8 g/dL — ABNORMAL LOW (ref 3.5–5.0)
Alkaline Phosphatase: 390 U/L — ABNORMAL HIGH (ref 38–126)
Anion gap: 7 (ref 5–15)
BUN: 14 mg/dL (ref 6–20)
CO2: 28 mmol/L (ref 22–32)
Calcium: 8.5 mg/dL — ABNORMAL LOW (ref 8.9–10.3)
Chloride: 94 mmol/L — ABNORMAL LOW (ref 98–111)
Creatinine, Ser: 0.55 mg/dL — ABNORMAL LOW (ref 0.61–1.24)
GFR, Estimated: >60 mL/min (ref >60)
Glucose, Bld: 231 mg/dL — ABNORMAL HIGH (ref 70–99)
Potassium: 3.8 mmol/L (ref 3.5–5.1)
Sodium: 129 mmol/L — ABNORMAL LOW (ref 135–145)
Total Bilirubin: 1.3 mg/dL — ABNORMAL HIGH (ref 0.3–1.2)
Total Protein: 5.9 g/dL — ABNORMAL LOW (ref 6.5–8.1)

## 2021-09-16 LAB — PHOSPHORUS: Phosphorus: 3.6 mg/dL (ref 2.5–4.6)

## 2021-09-16 LAB — MAGNESIUM: Magnesium: 2 mg/dL (ref 1.7–2.4)

## 2021-09-16 LAB — LIPASE, BLOOD: Lipase: 342 U/L — ABNORMAL HIGH (ref 11–51)

## 2021-09-16 MED ORDER — INSULIN GLARGINE 100 UNIT/ML SOLOSTAR PEN: 12.0000 [IU] | PEN_INJECTOR | Freq: Every day | SUBCUTANEOUS | 0 refills | Status: DC

## 2021-09-16 MED ORDER — BLOOD GLUCOSE MONITOR KIT: PACK | 0 refills | Status: DC

## 2021-09-16 MED ORDER — "PEN NEEDLES 3/16"" 31G X 5 MM MISC": 0 refills | Status: DC

## 2021-09-16 MED ORDER — INSULIN GLARGINE 100 UNIT/ML ~~LOC~~ SOLN: 12.0000 [IU] | Freq: Every day | SUBCUTANEOUS | 0 refills | Status: DC

## 2021-09-16 MED ORDER — INSULIN ASPART 100 UNIT/ML IJ SOLN: 4.0000 [IU] | Freq: Three times a day (TID) | INTRAMUSCULAR | 0 refills | Status: DC

## 2021-09-16 MED ORDER — INSULIN ASPART 100 UNIT/ML FLEXPEN: 4.0000 [IU] | PEN_INJECTOR | Freq: Three times a day (TID) | SUBCUTANEOUS | 0 refills | Status: DC

## 2021-09-16 MED ORDER — PROSOURCE PLUS PO LIQD
30.0000 mL | Freq: Two times a day (BID) | ORAL | 0 refills | Status: AC
Start: 2021-09-16 — End: 2021-10-16

## 2021-09-16 NOTE — Progress Notes (Signed)
Inpatient Diabetes Program Recommendations  AACE/ADA: New Consensus Statement on Inpatient Glycemic Control (2015)  Target Ranges:  Prepandial:   less than 140 mg/dL      Peak postprandial:   less than 180 mg/dL (1-2 hours)      Critically ill patients:  140 - 180 mg/dL   Lab Results  Component Value Date   GLUCAP 203 (H) 09/16/2021   HGBA1C 10.1 (H) 09/14/2021    Review of Glycemic Control  Current orders for Inpatient glycemic control: Semglee 10 units QD, Novolog 0-15 units TID with meals and 0-5 HS + 4 units TID  CBGs today: 231, 212 mg/dL  Inpatient Diabetes Program Recommendations:    Increase Semglee to 12 units Q24H   Will further review basic diabetes education with focus on injections, monitoring and hypoglycemia s/s and treatment.  Will need insulin pen needles (#[id number]) and Blood glucose meter kit (#[id number]) along with basal and bolus insulin pens  Thank you. Lorenda Peck, RD, LDN, CDE Inpatient Diabetes Coordinator [phone]

## 2021-09-16 NOTE — Telephone Encounter (Signed)
-----   Message from Lemar Lofty., MD sent at 09/15/2021  5:18 PM EST ----- Regarding: RE: Carlos [name] AE, Thanks for the update. Habiba Treloar please arrange a CT scan 1 to 2 days before his already scheduled clinic visit in December. Thanks. GM ----- Message ----- From: Peterson Ao Sent: 09/15/2021  10:38 AM EST To: Loretha Stapler, RN, Lemar Lofty., MD Subject: Carlos [name]                                      Ariam Mol-please arrange for follow-up office visit with Dr. Irish Lack Roddy for this patient-okay if several weeks out.  He is currently hospitalized.  He has chronic pancreatic pseudocysts and has had an episode of acute pancreatitis and some new pseudocyst formation.  Gabe - I'm sure you will review the new CT done last p.m.,, patient is really not very symptomatic, hopefully will be able to be discharged in the next couple of days.  Interestingly he has new onset of diabetes with DKA

## 2021-09-16 NOTE — Telephone Encounter (Signed)
Can you please confirm that you want CT abdomen and pelvis?

## 2021-09-16 NOTE — Discharge Summary (Signed)
Discharge Summary  Carlos [name] ELF:[id number] DOB: 08-09-1986  PCP: Pcp, No  Admit date: 09/13/2021 Discharge date: 09/16/2021  Time spent: 40 mins  Recommendations for Outpatient Follow-up:  Establish care with PCP and follow-up in 1 week with repeat labs Follow-up with GI Dr. Rush Landmark as scheduled   Discharge Diagnoses:  Active Hospital Problems   Diagnosis Date Noted   Protein-calorie malnutrition, severe 09/15/2021   Chronic pancreatitis due to chronic alcoholism ([place])    DKA (diabetic ketoacidosis) ([place]) 09/13/2021    Resolved Hospital Problems  No resolved problems to display.    Discharge Condition: Stable  Diet recommendation: Moderate carb diet  Vitals:   09/16/21 0356 09/16/21 1249  BP: 125/83 (!) 129/94  Pulse: 92 89  Resp: 20 18  Temp: 98.1 F (36.7 C) 98.2 F (36.8 C)  SpO2: 99% 98%    History of present illness:  Carlos [name] is a 35 y.o. 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  Patient's lipase level was also elevated and he was hyperbilirubinemic. CT abdomen/pelvis showed significant worsening acute changes of pancreatitis as well as a progressive pseudocyst formation and a new 5.7 cm collection consistent with a developing pseudocyst. [facility] admitted for hyperglycemia in the setting of DKA with a syncopal episode. GI consulted. Currently pt DKA has since resolved and transitioned to [place] insulin. GI recommending a low-fat diet from a pancreas standpoint. Pt to follow up with GI as outpatient after his acute episode of pancreatitis has resolved and new areas of pseudocyst formation to mature.    Today, pt reports feeling better, denies any  worsening abdominal pain, nausea/vomiting, fever/chills, chest pain, shortness of breath.  Patient educated on the need to establish care with PCP, be compliant with appointments and medications.  Patient verbalized understanding.  Patient has been educated on the use of insulin as well as dietary modification.     Hospital Course:  Active Problems:   DKA (diabetic ketoacidosis) (HCC)   Chronic pancreatitis due to chronic alcoholism ([place])   Protein-calorie malnutrition, severe   New Onset Diabetes with DKA DKA resolved, s/p IV insulin and aggressive hydration A1c 10.1 Switched to subcutaneous insulin, discharged on 12 units daily of Lantus, 4 units 3 times daily with meals of NovoLog With his coordinator consulted, educated patient thoroughly on insulin use and diet modification Patient to establish care with PCP for further follow-up  Pseudohyponatremia Likely from hyperglycemia Follow-up with PCP to repeat labs, strict glucose control  Acute on chronic alcoholic pancreatitis with pseudocyst  Elevated lipase-342, abdominal pain significantly improved CT abdomen/pelvis showed significant worsening acute changes of pancreatitis as well as a progressive pseudocyst formation and a new 5.7 cm collection consistent with a developing pseudocyst GI consulted, no further recommendation while inpatient, strongly advised outpatient follow-up with his GI, Dr. 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 Alvy Bimler consulted, recommend  discontinuing anticoagulation as his risk of bleeding from varices is very high and anticoagulation therapy is not likely going to improve his outcome DC home apixaban   Tobacco Abuse  Advised to quit   Severe malnutrition in the context of acute illness/injury/underweight Nutritionist consulted    Malnutrition Type:  Nutrition Problem: Severe Malnutrition Etiology: acute illness (uncontrolled diabetes)   Malnutrition Characteristics:  Signs/Symptoms: percent weight loss, moderate fat depletion, severe muscle depletion   Nutrition Interventions:  Interventions: Ensure Enlive (each supplement provides 350kcal and 20 grams of protein), MVI, Prostat, Education   Estimated body mass index is 14.33 kg/m as calculated from the following:   Height as of this encounter: 6' 4.5" (1.943 m).   Weight as of this encounter: 54.1 kg.    Procedures: None  Consultations: GI Heme-onc  Discharge Exam: BP (!) 129/94 (BP Location: Left Arm)   Pulse 89   Temp 98.2 F (36.8 C) (Oral)   Resp 18   Ht 6' 4.5" (1.943 m)   Wt 54.1 kg   SpO2 98%   BMI 14.33 kg/m   General: NAD  Cardiovascular: S1, S2 present Respiratory: CTAB Abdomen: Soft, nontender, nondistended, bowel sounds present Musculoskeletal: No bilateral pedal edema noted Skin: Normal Psychiatry: Normal mood   Discharge Instructions You were cared for by a hospitalist during your hospital stay. If you have any questions about your discharge medications or the care you received while you were in the hospital after you are discharged, you can call the unit and asked to speak with the hospitalist on call if the hospitalist that took care of you is not available. Once you are discharged, your primary care physician will handle any further medical issues. Please note that NO REFILLS for any discharge medications will be authorized once you are discharged, as it is imperative that you return to your primary care physician (or  establish a relationship with a primary care physician if you do not have one) for your aftercare needs so that they can reassess your need for medications and monitor your lab values.   Allergies as of 09/16/2021       Reactions   Other Anaphylaxis   Reaction to boysenberry syrup        Medication List     STOP taking these medications    apixaban 5 MG Tabs tablet Commonly known as: Eliquis       TAKE these medications    (feeding supplement) PROSource Plus liquid Take 30 mLs by mouth 2 (two) times daily between meals.   blood glucose meter kit and supplies Kit Dispense based on patient and insurance preference. Use up to four times daily as directed.   Centrum tablet Take 1 tablet by mouth every morning.   ibuprofen 200 MG tablet Commonly known as: ADVIL Take 400 mg by mouth every 6 (six) hours as needed for mild pain.   insulin aspart 100 UNIT/ML FlexPen Commonly known as: NOVOLOG Inject 4 Units into the skin 3 (three) times daily with meals.   insulin glargine 100 UNIT/ML Solostar Pen Commonly known as: LANTUS Inject 12 Units into the skin daily.   lipase/protease/amylase 36000 UNITS Cpep capsule Commonly known as: Creon Take 2 capsules (72,000 Units total) by mouth 3 (three) times daily with meals. May also take 1 capsule (36,000 Units total) as needed (with snacks). What changed: See the new instructions.   Pen Needles 3/16" 31G X 5 MM Misc Use as directed with insulin pen   traMADol 50 MG  tablet Commonly known as: ULTRAM Take 0.5 tablets (25 mg total) by mouth every 12 (twelve) hours as needed for severe pain.       Allergies  Allergen Reactions   Other Anaphylaxis    Reaction to boysenberry syrup      The results of significant diagnostics from this hospitalization (including imaging, microbiology, ancillary and laboratory) are listed below for reference.    Significant Diagnostic Studies: DG Chest 2 View  Result Date:  09/13/2021 CLINICAL DATA:  Shortness of breath. EXAM: CHEST - 2 VIEW COMPARISON:  Chest radiograph 10/07/2020 FINDINGS: The heart size and mediastinal contours are within normal limits. Hyperinflation. Both lungs are clear. No pleural effusion or pneumothorax. The visualized skeletal structures are unremarkable. IMPRESSION: No active cardiopulmonary disease. Electronically Signed   By: Ileana Roup M.D.   On: 09/13/2021 14:39   CT ABDOMEN PELVIS W CONTRAST  Result Date: 09/14/2021 CLINICAL DATA:  Mid abdominal pain for 1 week in 20 lb weight loss in 2 weeks. Persistent pancreatic pseudocysts related to previous pancreatitis. EXAM: CT ABDOMEN AND PELVIS WITH CONTRAST TECHNIQUE: Multidetector CT imaging of the abdomen and pelvis was performed using the standard protocol following bolus administration of intravenous contrast. CONTRAST:  3m OMNIPAQUE IOHEXOL 350 MG/ML SOLN COMPARISON:  08/14/2021 FINDINGS: Lower chest: Unremarkable. Hepatobiliary: Mildly progressive dilatation of the gallbladder with no gallstones or wall thickening seen. Diffuse low density of the liver relative to the spleen without significant change. Pancreas: The previously demonstrated 7.4 cm pancreatic tail pseudocyst is smaller, currently measuring 6.1 cm. The previously demonstrated 7.2 cm pseudocyst inferior to the pancreatic head is larger, measuring 8.1 cm with an interval separate lateral extension measuring 3.9 cm. Interval 5.7 cm similar-appearing fluid collection medial to the spleen and gastric fundus with multiple small, similar appearing new fluid collections in the adjacent edematous tissues. Multiple small pancreatic calcifications are again demonstrated. Spleen: Normal in size without focal abnormality. Adrenals/Urinary Tract: Adrenal glands are unremarkable. Kidneys are normal, without renal calculi, focal lesion, or hydronephrosis. Bladder is unremarkable. Stomach/Bowel: Stomach is within normal limits. Appendix appears  normal. No evidence of bowel wall thickening, distention, or inflammatory changes. Vascular/Lymphatic: Previously noted splenic vein occlusion with associated collaterals. No interval significant vascular findings are present. No enlarged abdominal or pelvic lymph nodes. Reproductive: Prostate is unremarkable. Other: Significant progression and retroperitoneal edema. No interval moderate amount of free peritoneal fluid in the pelvis. Musculoskeletal: Unremarkable bones. IMPRESSION: 1. Significant increase in retroperitoneal edema, most likely due to interval acute changes of pancreatitis. 2. Progressive pseudocyst formation, as described above. The only incompletely brand new fluid collection is medial to the spleen and gastric fundus with multiple smaller fluid collections in that area with mild stranding in the adjacent edematous tissues. This is also most likely a developing pseudocyst but the possibility of abscess formation would need to be excluded clinically. 3. Mildly progressive gallbladder dilatation. 4. Moderate amount of pelvic ascites. 5. Diffuse hepatic steatosis. Electronically Signed   By: SClaudie ReveringM.D.   On: 09/14/2021 16:50    Microbiology: Recent Results (from the past 240 hour(s))  Resp Panel by RT-PCR (Flu A&B, Covid) Nasopharyngeal Swab     Status: None   Collection Time: 09/13/21  5:22 PM   Specimen: Nasopharyngeal Swab; Nasopharyngeal(NP) swabs in vial transport medium  Result Value Ref Range Status   SARS Coronavirus 2 by RT PCR NEGATIVE NEGATIVE Final    Comment: (NOTE) SARS-CoV-2 target nucleic acids are NOT DETECTED.  The SARS-CoV-2 RNA is generally detectable  in upper respiratory specimens during the acute phase of infection. The lowest concentration of SARS-CoV-2 viral copies this assay can detect is 138 copies/mL. A negative result does not preclude SARS-Cov-2 infection and should not be used as the sole basis for treatment or other patient management decisions. A  negative result may occur with  improper specimen collection/handling, submission of specimen other than nasopharyngeal swab, presence of viral mutation(s) within the areas targeted by this assay, and inadequate number of viral copies(<138 copies/mL). A negative result must be combined with clinical observations, patient history, and epidemiological information. The expected result is Negative.  Fact Sheet for Patients:  EntrepreneurPulse.com.au  Fact Sheet for Healthcare Providers:  IncredibleEmployment.be  This test is no t yet approved or cleared by the [place] FDA and  has been authorized for detection and/or diagnosis of SARS-CoV-2 by FDA under an Emergency Use Authorization (EUA). This EUA will remain  in effect (meaning this test can be used) for the duration of the COVID-19 declaration under Section 564(b)(1) of the Act, 21 U.S.C.section 360bbb-3(b)(1), unless the authorization is terminated  or revoked sooner.       Influenza A by PCR NEGATIVE NEGATIVE Final   Influenza B by PCR NEGATIVE NEGATIVE Final    Comment: (NOTE) The Xpert Xpress SARS-CoV-2/FLU/RSV plus assay is intended as an aid in the diagnosis of influenza from Nasopharyngeal swab specimens and should not be used as a sole basis for treatment. Nasal washings and aspirates are unacceptable for Xpert Xpress SARS-CoV-2/FLU/RSV testing.  Fact Sheet for Patients: EntrepreneurPulse.com.au  Fact Sheet for Healthcare Providers: IncredibleEmployment.be  This test is not yet approved or cleared by the [place] FDA and has been authorized for detection and/or diagnosis of SARS-CoV-2 by FDA under an Emergency Use Authorization (EUA). This EUA will remain in effect (meaning this test can be used) for the duration of the COVID-19 declaration under Section 564(b)(1) of the Act, 21 U.S.C. section 360bbb-3(b)(1), unless the authorization  is terminated or revoked.  Performed at [facility], [place] [address] [place], [place] [address]   MRSA Next Gen by PCR, Nasal     Status: None   Collection Time: 09/14/21 12:08 AM   Specimen: Nasal Mucosa; Nasal Swab  Result Value Ref Range Status   MRSA by PCR Next Gen NOT DETECTED NOT DETECTED Final    Comment: (NOTE) The GeneXpert MRSA Assay (FDA approved for NASAL specimens only), is one component of a comprehensive MRSA colonization surveillance program. It is not intended to diagnose MRSA infection nor to guide or monitor treatment for MRSA infections. Test performance is not FDA approved in patients less than 82 years old. Performed at [facility], [place] [address], [place], [place] [address]      Labs: Basic Metabolic Panel: Recent Labs  Lab 09/14/21 0610 09/14/21 1011 09/14/21 1439 09/15/21 0449 09/16/21 0447  NA 130* 131* 128* 131* 129*  K 3.2* 3.8 3.7 4.5 3.8  CL 101 96* 94* 96* 94*  CO2 _0 GLUCOSE 145* 381* 402* 211* 231*  BUN _1 CREATININE 0.52* 0.60* 0.81 0.61 0.55*  CALCIUM 8.8* 8.8* 8.7* 8.7* 8.5*  MG  --   --   --  1.7 2.0  PHOS  --   --   --  1.5* 3.6   Liver Function Tests: Recent Labs  Lab 09/13/21 1401 09/14/21 0814 09/15/21 0449 09/16/21 0447  AST 21 22 117* 343*  ALT 45*  31 79* 162*  ALKPHOS 331* 203* 345* 390*  BILITOT 2.3* 0.9 1.7* 1.3*  PROT 8.9* 6.2* 6.1* 5.9*  ALBUMIN 4.5 2.9* 3.0* 2.8*   Recent Labs  Lab 09/13/21 1401 09/16/21 0447  LIPASE 122* 342*   No results for input(s): AMMONIA in the last 168 hours. CBC: Recent Labs  Lab 09/13/21 1401 09/14/21 0610 09/15/21 0449 09/16/21 0447  WBC 9.1 6.8 5.3 5.0  NEUTROABS 7.8*  --  3.5 3.4  HGB 16.2 13.1 13.2 11.4*  HCT 47.4 35.9* 37.2* 32.5*  MCV 101.9* 96.2 97.6 98.8  PLT 400 250 248 227   Cardiac Enzymes: No results for input(s): CKTOTAL, CKMB, CKMBINDEX, TROPONINI in the last 168 hours. BNP: BNP  (last 3 results) Recent Labs    09/27/20 1032  BNP 77.3    ProBNP (last 3 results) No results for input(s): PROBNP in the last 8760 hours.  CBG: Recent Labs  Lab 09/15/21 1216 09/15/21 1647 09/15/21 2002 09/16/21 0758 09/16/21 1151  GLUCAP 170* 208* 89 203* 177*       Signed:  Alma Friendly, MD [facility] 09/16/2021, 1:33 PM

## 2021-09-16 NOTE — Discharge Instructions (Signed)
You have a follow up appt scheduled with Dr [name] - Jarvis Newcomer on 12/2 at 2;10 pm - 3rd floor  [facility] [address]  Office will call you to schedule a follow up CT prior to that appt

## 2021-09-17 ENCOUNTER — Other Ambulatory Visit: Payer: Self-pay

## 2021-09-17 DIAGNOSIS — K863 Pseudocyst of pancreas: Secondary | ICD-10-CM

## 2021-09-17 NOTE — Telephone Encounter (Signed)
The order has been entered for CT and sent to the schedulers.  The pt has an appt for 12/2 with Dr [name].

## 2021-09-25 ENCOUNTER — Ambulatory Visit: Payer: Medicaid Other | Admitting: Family

## 2021-09-28 ENCOUNTER — Ambulatory Visit (INDEPENDENT_AMBULATORY_CARE_PROVIDER_SITE_OTHER): Payer: Managed Care, Other (non HMO) | Admitting: Family Medicine

## 2021-09-28 ENCOUNTER — Other Ambulatory Visit: Payer: Self-pay

## 2021-09-28 ENCOUNTER — Encounter: Payer: Self-pay | Admitting: Family Medicine

## 2021-09-28 VITALS — BP 130/70 | HR 90 | Temp 97.7°F | Ht 77.0 in | Wt 135.8 lb

## 2021-09-28 DIAGNOSIS — E1010 Type 1 diabetes mellitus with ketoacidosis without coma: Secondary | ICD-10-CM | POA: Diagnosis not present

## 2021-09-28 DIAGNOSIS — E1310 Other specified diabetes mellitus with ketoacidosis without coma: Secondary | ICD-10-CM | POA: Diagnosis not present

## 2021-09-28 DIAGNOSIS — K8522 Alcohol induced acute pancreatitis with infected necrosis: Secondary | ICD-10-CM | POA: Diagnosis not present

## 2021-09-28 DIAGNOSIS — G8929 Other chronic pain: Secondary | ICD-10-CM

## 2021-09-28 DIAGNOSIS — R1013 Epigastric pain: Secondary | ICD-10-CM

## 2021-09-28 MED ORDER — LISINOPRIL 5 MG PO TABS
5.0000 mg | ORAL_TABLET | Freq: Every day | ORAL | 3 refills | Status: DC
Start: 2021-09-28 — End: 2021-12-07

## 2021-09-28 NOTE — Progress Notes (Signed)
Provider:  Alain Honey, MD  Careteam: Patient Care Team: Pcp, No as PCP - General Jodi Marble, MD as Consulting Physician (Otolaryngology)  PLACE OF SERVICE:  [facility] Directive information    Allergies  Allergen Reactions   Other Anaphylaxis    [name] syrup    Chief Complaint  Patient presents with   New Patient (Initial Visit)    Patient presents today for a new patient appointment.      HPI: Patient is a 35 y.o. male new patient and hospital follow-up.  Was hospitalized from 11 6-11 9 with chronic pancreatitis due to chronic alcoholism, diabetic ketoacidosis and protein calorie malnutrition with weight loss. In the hospital he was found to have pancreatic pseudocyst.  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  Respiratory: Negative.    Cardiovascular: Negative.   Gastrointestinal:  Positive for abdominal pain.  Psychiatric/Behavioral:  Positive for substance abuse.   All other systems reviewed and are negative.  Past Medical History:  Diagnosis Date   Anxiety    Diabetes ([place])    Pancreatitis    Splenic vein thrombosis    Past Surgical History:  Procedure Laterality Date   NO PAST SURGERIES     Social History:   reports that he has been smoking cigars. He started smoking about 18 years ago. He has never used smokeless tobacco. He reports that he does not currently use alcohol. He reports that he does not use drugs.  Family History  Problem Relation Age of Onset   Hypertension Mother    Alcoholism Maternal Uncle    Diabetes Maternal Grandmother    Diabetes Maternal Grandfather     Diabetes Paternal Grandmother    Diabetes Paternal Grandfather    Colon cancer Neg Hx    Esophageal cancer Neg Hx    Inflammatory bowel disease Neg Hx    Liver disease Neg Hx    Pancreatic cancer Neg Hx    Rectal cancer Neg Hx    Stomach cancer Neg Hx     Medications: Patient's Medications  New Prescriptions   No medications on file  Previous Medications   BLOOD GLUCOSE METER KIT AND SUPPLIES KIT    Dispense based on patient and insurance preference. Use up to four times daily as directed.   IBUPROFEN (ADVIL) 200 MG TABLET    Take 400 mg by mouth every 6 (six) hours as needed for mild pain.   INSULIN ASPART (NOVOLOG) 100 UNIT/ML FLEXPEN    Inject 4 Units into the skin 3 (three) times daily with meals.   INSULIN GLARGINE (LANTUS) 100 UNIT/ML SOLOSTAR PEN    Inject 12 Units into the skin daily.   INSULIN PEN NEEDLE (PEN NEEDLES 3/16") 31G X 5 MM MISC    Use as directed with insulin pen   LIPASE/PROTEASE/AMYLASE (CREON) 36000 UNITS CPEP CAPSULE    Take 2 capsules (72,000 Units total) by mouth 3 (three) times daily with meals. May also take 1 capsule (36,000 Units total) as needed (with snacks).   MULTIPLE VITAMINS-MINERALS (CENTRUM) TABLET    Take 1 tablet by mouth every morning.   NUTRITIONAL SUPPLEMENTS (,FEEDING SUPPLEMENT, PROSOURCE PLUS) LIQUID  Take 30 mLs by mouth 2 (two) times daily between meals.   TRAMADOL (ULTRAM) 50 MG TABLET    Take 0.5 tablets (25 mg total) by mouth every 12 (twelve) hours as needed for severe pain.  Modified Medications   No medications on file  Discontinued Medications   No medications on file    Physical Exam:  Vitals:   09/28/21 1036  BP: 130/70  Pulse: 90  Temp: 97.7 F (36.5 C)  SpO2: 99%  Weight: 135 lb 12.8 oz (61.6 kg)  Height: _0  (1.956 m)   Body mass index is 16.1 kg/m. Wt Readings from Last 3 Encounters:  09/28/21 135 lb 12.8 oz (61.6 kg)  09/14/21 119 lb 4.3 oz (54.1 kg)  08/20/21 139 lb (63 kg)    Physical Exam Vitals  and nursing note reviewed.  Constitutional:      Appearance: Normal appearance.  HENT:     Right Ear: Tympanic membrane normal.     Left Ear: Tympanic membrane normal.  Eyes:     Extraocular Movements: Extraocular movements intact.     Pupils: Pupils are equal, round, and reactive to light.  Cardiovascular:     Rate and Rhythm: Normal rate and regular rhythm.     Pulses: Normal pulses.  Pulmonary:     Effort: Pulmonary effort is normal.     Breath sounds: Normal breath sounds.  Musculoskeletal:        General: Normal range of motion.     Cervical back: Normal range of motion.  Skin:    General: Skin is warm and dry.  Neurological:     General: No focal deficit present.     Mental Status: He is alert and oriented to person, place, and time.  Psychiatric:        Mood and Affect: Mood normal.        Behavior: Behavior normal.    Labs reviewed: Basic Metabolic Panel: Recent Labs    10/04/20 0205 10/05/20 0518 10/07/20 0403 08/14/21 0935 09/14/21 1439 09/15/21 0449 09/16/21 0447  NA 136   < > 131*   < > 128* 131* 129*  K 3.8   < > 3.7   < > 3.7 4.5 3.8  CL 99   < > 101   < > 94* 96* 94*  CO2 23   < > 23   < > _1 GLUCOSE 188*   < > 147*   < > 402* 211* 231*  BUN 6   < > <5*   < > _2 CREATININE 1.03   < > 0.95   < > 0.81 0.61 0.55*  CALCIUM 8.6*   < > 8.7*   < > 8.7* 8.7* 8.5*  MG 1.9  --  1.9  --   --  1.7 2.0  PHOS 3.6  --   --   --   --  1.5* 3.6   < > = values in this interval not displayed.   Liver Function Tests: Recent Labs    09/14/21 0814 09/15/21 0449 09/16/21 0447  AST 22 117* 343*  ALT 31 79* 162*  ALKPHOS 203* 345* 390*  BILITOT 0.9 1.7* 1.3*  PROT 6.2* 6.1* 5.9*  ALBUMIN 2.9* 3.0* 2.8*   Recent Labs    08/14/21 0935 09/13/21 1401 09/16/21 0447  LIPASE 63* 122* 342*   Recent Labs    09/29/20 0110  AMMONIA 43*   CBC: Recent Labs    09/13/21 1401  09/14/21 0610 09/15/21 0449 09/16/21 0447  WBC 9.1 6.8 5.3 5.0   NEUTROABS 7.8*  --  3.5 3.4  HGB 16.2 13.1 13.2 11.4*  HCT 47.4 35.9* 37.2* 32.5*  MCV 101.9* 96.2 97.6 98.8  PLT 400 250 248 227   Lipid Panel: Recent Labs    09/30/20 1700  TRIG 192*   TSH: No results for input(s): TSH in the last 8760 hours. A1C: Lab Results  Component Value Date   HGBA1C 10.1 (H) 09/14/2021     Assessment/Plan  1. Type 1 diabetes mellitus with ketoacidosis without coma ([place]) He will continue with Lantus 12 units daily along with NovoLog 4 units 3 times a day with meals.  Add baby aspirin and lisinopril - lisinopril (ZESTRIL) 5 MG tablet; Take 1 tablet (5 mg total) by mouth daily.  Dispense: 90 tablet; Refill: 3  2. Abdominal pain, chronic, epigastric Chronic pancreatitis.  Has follow-up appointment with CT and GI  3. Alcohol-induced acute pancreatitis with infected necrosis Patient tells me he is not drinking I stressed the importance of abstinence  4. Diabetic ketoacidosis without coma associated with other specified diabetes mellitus ([place]) He is monitoring sugars at home.  Have ranged from 73-280.  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

## 2021-09-28 NOTE — Patient Instructions (Addendum)
Continue using insulin like you have been long and short acting Continue to watch your carbohydrate intake Continue to abstain from alcohol Begin aspirin, 81 mg/day

## 2021-09-29 ENCOUNTER — Ambulatory Visit (HOSPITAL_COMMUNITY): Payer: Managed Care, Other (non HMO)

## 2021-09-30 ENCOUNTER — Telehealth: Payer: Self-pay | Admitting: *Deleted

## 2021-09-30 NOTE — Telephone Encounter (Signed)
Patient dropped off a Restrictions Form from his work to have filled out from [employer].   Placed form in Dr. Garen Lah folder to review, fill out and sign.  To call patient once completed #[phone]

## 2021-10-07 ENCOUNTER — Other Ambulatory Visit: Payer: Self-pay

## 2021-10-07 ENCOUNTER — Encounter (HOSPITAL_COMMUNITY): Payer: Self-pay

## 2021-10-07 ENCOUNTER — Ambulatory Visit (HOSPITAL_COMMUNITY)
Admission: RE | Admit: 2021-10-07 | Discharge: 2021-10-07 | Disposition: A | Discharge status: Home / Self Care | Payer: Managed Care, Other (non HMO) | Source: Ambulatory Visit | Attending: Gastroenterology | Admitting: Gastroenterology

## 2021-10-07 DIAGNOSIS — K863 Pseudocyst of pancreas: Secondary | ICD-10-CM | POA: Diagnosis present

## 2021-10-07 MED ORDER — SODIUM CHLORIDE (PF) 0.9 % IJ SOLN
INTRAMUSCULAR | Status: AC
  Filled 2021-10-07: qty 50

## 2021-10-07 MED ORDER — IOHEXOL 350 MG/ML SOLN
80.0000 mL | Freq: Once | INTRAVENOUS | Status: AC | PRN
  Administered 2021-10-07: 80 mL via INTRAVENOUS

## 2021-10-08 ENCOUNTER — Ambulatory Visit: Payer: Self-pay | Admitting: Orthopedic Surgery

## 2021-10-09 ENCOUNTER — Encounter: Payer: Self-pay | Admitting: Gastroenterology

## 2021-10-09 ENCOUNTER — Ambulatory Visit (INDEPENDENT_AMBULATORY_CARE_PROVIDER_SITE_OTHER): Payer: Managed Care, Other (non HMO) | Admitting: Gastroenterology

## 2021-10-09 ENCOUNTER — Other Ambulatory Visit (INDEPENDENT_AMBULATORY_CARE_PROVIDER_SITE_OTHER): Payer: Managed Care, Other (non HMO)

## 2021-10-09 VITALS — BP 110/74 | HR 99 | Ht 77.0 in | Wt 135.0 lb

## 2021-10-09 DIAGNOSIS — R935 Abnormal findings on diagnostic imaging of other abdominal regions, including retroperitoneum: Secondary | ICD-10-CM

## 2021-10-09 DIAGNOSIS — K863 Pseudocyst of pancreas: Secondary | ICD-10-CM

## 2021-10-09 DIAGNOSIS — E089 Diabetes mellitus due to underlying condition without complications: Secondary | ICD-10-CM

## 2021-10-09 DIAGNOSIS — R1013 Epigastric pain: Secondary | ICD-10-CM | POA: Diagnosis not present

## 2021-10-09 DIAGNOSIS — K858 Other acute pancreatitis without necrosis or infection: Secondary | ICD-10-CM | POA: Diagnosis not present

## 2021-10-09 DIAGNOSIS — I82890 Acute embolism and thrombosis of other specified veins: Secondary | ICD-10-CM

## 2021-10-09 DIAGNOSIS — E1369 Other specified diabetes mellitus with other specified complication: Secondary | ICD-10-CM

## 2021-10-09 DIAGNOSIS — R6881 Early satiety: Secondary | ICD-10-CM

## 2021-10-09 DIAGNOSIS — R7989 Other specified abnormal findings of blood chemistry: Secondary | ICD-10-CM

## 2021-10-09 DIAGNOSIS — G8929 Other chronic pain: Secondary | ICD-10-CM

## 2021-10-09 LAB — CBC
HCT: 37.4 % — ABNORMAL LOW (ref 39.0–52.0)
Hemoglobin: 12.7 g/dL — ABNORMAL LOW (ref 13.0–17.0)
MCHC: 33.9 g/dL (ref 30.0–36.0)
MCV: 108.9 fl — ABNORMAL HIGH (ref 78.0–100.0)
Platelets: 253 10*3/uL (ref 150.0–400.0)
RBC: 3.44 Mil/uL — ABNORMAL LOW (ref 4.22–5.81)
RDW: 15 % (ref 11.5–15.5)
WBC: 3.1 10*3/uL — ABNORMAL LOW (ref 4.0–10.5)

## 2021-10-09 LAB — PROTIME-INR
INR: 1 ratio (ref 0.8–1.0)
Prothrombin Time: 11.2 s (ref 9.6–13.1)

## 2021-10-09 LAB — COMPREHENSIVE METABOLIC PANEL
ALT: 18 U/L (ref 0–53)
AST: 15 U/L (ref 0–37)
Albumin: 4 g/dL (ref 3.5–5.2)
Alkaline Phosphatase: 127 U/L — ABNORMAL HIGH (ref 39–117)
BUN: 8 mg/dL (ref 6–23)
CO2: 28 mEq/L (ref 19–32)
Calcium: 9.4 mg/dL (ref 8.4–10.5)
Chloride: 98 mEq/L (ref 96–112)
Creatinine, Ser: 0.82 mg/dL (ref 0.40–1.50)
GFR: 113.49 mL/min (ref >60.00)
Glucose, Bld: 191 mg/dL — ABNORMAL HIGH (ref 70–99)
Potassium: 4.1 mEq/L (ref 3.5–5.1)
Sodium: 133 mEq/L — ABNORMAL LOW (ref 135–145)
Total Bilirubin: 0.8 mg/dL (ref 0.2–1.2)
Total Protein: 7 g/dL (ref 6.0–8.3)

## 2021-10-09 LAB — C-REACTIVE PROTEIN: CRP: <1 mg/dL (ref 0.5–20.0)

## 2021-10-09 LAB — SEDIMENTATION RATE: Sed Rate: 14 mm/hr (ref 0–15)

## 2021-10-09 NOTE — Patient Instructions (Addendum)
Your provider has requested that you go to the basement level for lab work before leaving today. Press "B" on the elevator. The lab is located at the first door on the left as you exit the elevator.  You have been scheduled for an endoscopy. Please follow written instructions given to you at your visit today. If you use inhalers (even only as needed), please bring them with you on the day of your procedure.  You have been referred to Endocrinologist for your diabetes. Their office will contact you with an appointment. If you have not heard from their office in 1-2 weeks, please let us know.   If you are age 30 or younger, your body mass index should be between 19-25. Your Body mass index is 16.01 kg/m. If this is out of the aformentioned range listed, please consider follow up with your Primary Care Provider.   ________________________________________________________  The [facility] providers would like to encourage you to use [facility] to communicate with providers for non-urgent requests or questions.  Due to long hold times on the telephone, sending your provider a message by [facility] may be a faster and more efficient way to get a response.  Please allow 48 business hours for a response.  Please remember that this is for non-urgent requests.  _______________________________________________________  Thank you for choosing me and [facility].  Dr. [name]

## 2021-10-12 ENCOUNTER — Other Ambulatory Visit: Payer: Self-pay

## 2021-10-12 ENCOUNTER — Encounter: Payer: Self-pay | Admitting: Gastroenterology

## 2021-10-12 DIAGNOSIS — D649 Anemia, unspecified: Secondary | ICD-10-CM

## 2021-10-12 NOTE — Progress Notes (Signed)
[place] VISIT   Primary Care Provider [name], MD [place] [address] [phone]   Patient Profile: Carlos [name] is a 35 y.o. 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    The patient presents to the [facility] for an evaluation and management of problem(s) noted below:  Problem List 1. Pancreatic pseudocyst   2. Abdominal pain, chronic, epigastric   3. Other acute pancreatitis without infection or necrosis   4. Early satiety   5. Abnormal LFTs   6. Splenic vein thrombosis   7. Abnormal CT of the abdomen   8. 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  Interval History The patient returns with his significant other for follow-up.  Few weeks after being seen in clinic, he found his way back to the emergency department with progressive symptoms.  The patient was diagnosed with new onset diabetes.  Imaging was performed that showed evidence of enlarging pseudocyst.  Patient was able to advance his diet and able to be discharged.  He underwent repeat imaging to reevaluate the cysts and that is noted below with slight enlargement still being noted.  Today, the patient states that he is eating at least 3 meals per day and nearly as much as he wants.  He does note that if he eats too significant amount of food that he will get full faster and stop eating.  His weight has been relatively stable to slightly increasing at this time.  Abdominal pain persists and remains the biggest issue at this time.  It is tolerable but not easily managed with tramadol.  Pain is sharp and stabbing at times and  dull at other times.  His blood sugars are being managed by his new PCP.  GI Review of Systems Positive as above Negative for dysphagia, odynophagia, alteration of bowel habits, melena, hematochezia and  Review of Systems General: Denies fevers/chills/unintentional weight loss Cardiovascular: Denies chest pain Pulmonary: Denies shortness of breath Gastroenterological: See HPI Genitourinary: Denies darkened urine Hematological: Denies easy bruising/bleeding Dermatological: Denies skin changes Psychological: Mood is stable but hopeful to improve   Medications Current Outpatient Medications  Medication Sig Dispense Refill   blood glucose meter kit and supplies KIT Dispense based on patient and insurance preference. Use up to four times daily as directed. 1 each 0   ibuprofen (ADVIL) 200 MG tablet Take 400 mg by mouth every 6 (six) hours as needed for mild pain.     insulin aspart (NOVOLOG) 100 UNIT/ML FlexPen Inject 4 Units into the skin 3 (three) times daily with meals. 15 mL 0   insulin glargine (LANTUS) 100 UNIT/ML Solostar Pen Inject 12 Units into the skin daily. 15 mL 0   Insulin Pen Needle (PEN NEEDLES 3/16") 31G X 5 MM MISC Use as directed with insulin pen 100 each 0   lipase/protease/amylase (CREON) 36000 UNITS CPEP capsule Take 2 capsules (72,000 Units total) by mouth 3 (three) times daily with meals. May also take 1 capsule (36,000 Units total) as needed (with snacks). (Patient taking differently: Take 2 capsules (72000 units) by mouth daily with breakfast and supper, take 1 capsule (36000 units) with lunch/midday snack) 240 capsule 11   lisinopril (ZESTRIL)  5 MG tablet Take 1 tablet (5 mg total) by mouth daily. 90 tablet 3   Multiple Vitamins-Minerals (CENTRUM) tablet Take 1 tablet by mouth every morning.     Nutritional Supplements (,FEEDING SUPPLEMENT, PROSOURCE PLUS) liquid Take 30 mLs by mouth 2 (two) times daily between meals. 1800 mL 0   traMADol (ULTRAM) 50 MG tablet Take  0.5 tablets (25 mg total) by mouth every 12 (twelve) hours as needed for severe pain. 30 tablet 1   No current facility-administered medications for this visit.    Allergies Allergies  Allergen Reactions   Other Anaphylaxis    [name] syrup    Histories Past Medical History:  Diagnosis Date   Anxiety    Diabetes ([place])    Pancreatitis    Splenic vein thrombosis    Past Surgical History:  Procedure Laterality Date   NO PAST SURGERIES     Social History   Socioeconomic History   Marital status: Single    Spouse name: Not on file   Number of children: 6   Years of education: Not on file   Highest education level: Not on file  Occupational History   Occupation: forklift driver  Tobacco Use   Smoking status: Every Day    Types: Cigars    Start date: 11/08/2002   Smokeless tobacco: Never   Tobacco comments:    Black and Mild- 2-3 a day, Started at age 85   Vaping Use   Vaping Use: Never used  Substance and Sexual Activity   Alcohol use: Not Currently   Drug use: Never   Sexual activity: Not on file  Other Topics Concern   Not on file  Social History Narrative   Not on file   Social Determinants of Health   Financial Resource Strain: Not on file  Food Insecurity: Not on file  Transportation Needs: Not on file  Physical Activity: Not on file  Stress: Not on file  Social Connections: Not on file  Intimate Partner Violence: Not on file   Family History  Problem Relation Age of Onset   Hypertension Mother    Alcoholism Maternal Uncle    Diabetes Maternal Grandmother    Diabetes Maternal Grandfather    Diabetes Paternal Grandmother    Diabetes Paternal Grandfather    Colon cancer Neg Hx    Esophageal cancer Neg Hx    Inflammatory bowel disease Neg Hx    Liver disease Neg Hx    Pancreatic cancer Neg Hx    Rectal cancer Neg Hx    Stomach cancer Neg Hx    I have reviewed his medical, social, and family history in detail and updated the electronic  medical record as necessary.    PHYSICAL EXAMINATION  BP 110/74   Pulse 99   Ht '6\' 5"'  (1.956 m)   Wt 135 lb (61.2 kg)   BMI 16.01 kg/m  Wt Readings from Last 3 Encounters:  10/09/21 135 lb (61.2 kg)  09/28/21 135 lb 12.8 oz (61.6 kg)  09/14/21 119 lb 4.3 oz (54.1 kg)  GEN: NAD, appears stated age, doesn't appear chronically ill, accompanied by significant other PSYCH: Cooperative, without pressured speech EYE: Conjunctivae pink, sclerae anicteric ENT: MMM CV: Nontachycardic RESP: No audible wheezing GI: NABS, soft, TTP in upper abdomen upon deep palpation, without rebound MSK/EXT: No lower extremity edema SKIN: No jaundice NEURO:  Alert & Oriented x 3, no focal deficits   REVIEW OF DATA  I reviewed the following data at the time of  this encounter:  GI Procedures and Studies  No relevant studies to review  Laboratory Studies  Reviewed those in epic  Imaging Studies  December 2022 CT abdomen pelvis with contrast IMPRESSION: 1. Signs of chronic pancreatitis with interval resolution of previous peripancreatic edema and free fluid. 2. 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 These have increased in size from previous exam. 3. Pseudocysts exhibit mass effect upon the splenic vein, which is significantly narrowed resulting in interval development of upper abdominal collaterals. 4. Small amount of free fluid noted within the dependent portion of the pelvis.  November 2022 CT abdomen pelvis with contrast IMPRESSION: 1. Significant increase in retroperitoneal edema, most likely due to interval acute changes of pancreatitis. 2. Progressive pseudocyst formation, as described above. The only incompletely brand new fluid collection is medial to the spleen and gastric fundus with multiple smaller fluid collections in that area with mild stranding in the adjacent edematous tissues. This is also most likely a developing pseudocyst  but the possibility of abscess formation would need to be excluded clinically. 3. Mildly progressive gallbladder dilatation. 4. Moderate amount of pelvic ascites. 5. Diffuse hepatic steatosis.   ASSESSMENT  Mr. Manes is a 35 y.o. 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 The patient is seen today for evaluation and management of:  1. Pancreatic pseudocyst   2. Abdominal pain, chronic, epigastric   3. Other acute pancreatitis without infection or necrosis   4. Early satiety   5. Abnormal LFTs   6. Splenic vein thrombosis   7. Abnormal CT of the abdomen   8. Diabetes mellitus due to underlying condition without complication, without long-term current use of insulin (HCC)    Today, the patient is hemodynamically stable.  Clinically he is improved from his recent hospitalization but is still experiencing abdominal pain as well as early satiety.  Thankfully weight has been increasing.  We personally reviewed his imaging from earlier this year as well as his most recent imaging which shows enlargement of the cyst.  The question with the enlargement he of the cyst is whether this is a result of a pancreatic duct disruption or result of continued pancreatitis.  The patient and I discussed the role of potential cyst gastrostomy.  The risks of an EUS, including intestinal perforation, bleeding, infection, aspiration, and medication effects were discussed.  When a cystgastrostomy/cystenterostomy is performed as part of the EUS, there is an additional risk of pancreatitis at the rate of about 1-2%.  It was explained that procedure related pancreatitis is typically mild, although at times it can be severe and even life threatening.  I think it is reasonable for [place] to consider this on 1 if not 2 of the pseudocysts although they  may all be connected.  For now the patient will continue his current diet and try not to overeat.  We will plan on trying to get him scheduled sooner than our January appointment if we can find a spot.  He is no longer on anticoagulation should think it is reasonable because he already has developed collateralization, the biggest impact will be whether any of these blood vessels remain in place to the point where we may not be able to perform cyst gastrostomy in the proximal stomach but hopefully he would have availability in the lower stomach for cyst gastrostomy creation.  Time will tell.  If the patient has a pancreatic duct disruption he may require an ERCP but we will not plan this unless cysts are drained and then recur.  The risks and benefits of endoscopic evaluation were discussed with the patient; these include but are not limited to the risk of perforation, infection, bleeding, missed lesions, lack of diagnosis, severe illness requiring hospitalization, as well as anesthesia and sedation related illnesses.  The patient and/or family is agreeable to proceed.  All patient questions were answered to the best of my ability, and the patient agrees to the aforementioned plan of action with follow-up as indicated.   PLAN  Proceed with scheduling EUS cyst gastrostomy creation  Recommend Tylenol 1000 mg every 8 hour and alternate with ibuprofen 400 mg every 8 hour May consider a Short course of tramadol to help patient with pain though long-term use of this medication will not likely be pursued Follow-up with PCP in regards to insulin dosing for now Complete alcohol abstinence Try to have a complete minimization of tobacco use moving forward Continue Creon 2 capsules (36K) with each meal daily and 1 capsule (36K) with each snack  Laboratories as outlined below to follow-up LFTs and preprocedure labs as well Referral to endocrinology per patient request   Orders Placed This Encounter  Procedures    Procedural/ Surgical Case Request: UPPER ESOPHAGEAL ENDOSCOPIC ULTRASOUND (EUS)   CBC   Comp Met (CMET)   Sedimentation rate   C-reactive protein   INR/PT   Ambulatory referral to Gastroenterology   Ambulatory referral to Endocrinology    New Prescriptions   No medications on file   Modified Medications   No medications on file    Planned Follow Up No follow-ups on file.   Total Time in Face-to-Face and in Coordination of Care for patient including independent/personal interpretation/review of prior testing, medical history, examination, medication adjustment, communicating results with the patient directly, reviewing his imaging as well as cyst gastrostomy creation information, and documentation within the EHR is 30 minutes.   Justice Britain, MD [facility] Advanced Endoscopy Office # [phone]

## 2021-10-13 DIAGNOSIS — K859 Acute pancreatitis without necrosis or infection, unspecified: Secondary | ICD-10-CM | POA: Insufficient documentation

## 2021-10-13 DIAGNOSIS — E089 Diabetes mellitus due to underlying condition without complications: Secondary | ICD-10-CM | POA: Insufficient documentation

## 2021-10-13 DIAGNOSIS — R7989 Other specified abnormal findings of blood chemistry: Secondary | ICD-10-CM | POA: Insufficient documentation

## 2021-11-22 ENCOUNTER — Encounter (HOSPITAL_COMMUNITY): Payer: Self-pay | Admitting: Gastroenterology

## 2021-11-23 NOTE — Progress Notes (Signed)
Attempted to obtain medical history via telephone, unable to reach at this time. I left a voicemail to return pre surgical testing department's phone call.  

## 2021-12-02 ENCOUNTER — Encounter (HOSPITAL_COMMUNITY)
Admission: RE | Disposition: A | Discharge status: Home / Self Care | Payer: Self-pay | Source: Home / Self Care | Attending: Gastroenterology

## 2021-12-02 ENCOUNTER — Ambulatory Visit (HOSPITAL_COMMUNITY): Payer: Managed Care, Other (non HMO) | Admitting: Anesthesiology

## 2021-12-02 ENCOUNTER — Ambulatory Visit (HOSPITAL_COMMUNITY)
Admission: RE | Admit: 2021-12-02 | Discharge: 2021-12-02 | Disposition: A | Discharge status: Home / Self Care | Payer: Managed Care, Other (non HMO) | Attending: Gastroenterology | Admitting: Gastroenterology

## 2021-12-02 ENCOUNTER — Other Ambulatory Visit: Payer: Self-pay

## 2021-12-02 ENCOUNTER — Encounter (HOSPITAL_COMMUNITY): Payer: Self-pay | Admitting: Gastroenterology

## 2021-12-02 DIAGNOSIS — K862 Cyst of pancreas: Secondary | ICD-10-CM | POA: Diagnosis not present

## 2021-12-02 DIAGNOSIS — K858 Other acute pancreatitis without necrosis or infection: Secondary | ICD-10-CM

## 2021-12-02 DIAGNOSIS — K2289 Other specified disease of esophagus: Secondary | ICD-10-CM | POA: Insufficient documentation

## 2021-12-02 DIAGNOSIS — K859 Acute pancreatitis without necrosis or infection, unspecified: Secondary | ICD-10-CM | POA: Insufficient documentation

## 2021-12-02 DIAGNOSIS — K208 Other esophagitis without bleeding: Secondary | ICD-10-CM

## 2021-12-02 DIAGNOSIS — E119 Type 2 diabetes mellitus without complications: Secondary | ICD-10-CM | POA: Diagnosis not present

## 2021-12-02 DIAGNOSIS — R1084 Generalized abdominal pain: Secondary | ICD-10-CM | POA: Insufficient documentation

## 2021-12-02 DIAGNOSIS — K838 Other specified diseases of biliary tract: Secondary | ICD-10-CM | POA: Insufficient documentation

## 2021-12-02 DIAGNOSIS — F419 Anxiety disorder, unspecified: Secondary | ICD-10-CM | POA: Insufficient documentation

## 2021-12-02 DIAGNOSIS — K3189 Other diseases of stomach and duodenum: Secondary | ICD-10-CM | POA: Insufficient documentation

## 2021-12-02 DIAGNOSIS — Z794 Long term (current) use of insulin: Secondary | ICD-10-CM | POA: Diagnosis not present

## 2021-12-02 DIAGNOSIS — R6881 Early satiety: Secondary | ICD-10-CM | POA: Diagnosis not present

## 2021-12-02 DIAGNOSIS — I10 Essential (primary) hypertension: Secondary | ICD-10-CM | POA: Diagnosis not present

## 2021-12-02 DIAGNOSIS — K863 Pseudocyst of pancreas: Secondary | ICD-10-CM

## 2021-12-02 DIAGNOSIS — K449 Diaphragmatic hernia without obstruction or gangrene: Secondary | ICD-10-CM | POA: Diagnosis not present

## 2021-12-02 DIAGNOSIS — R590 Localized enlarged lymph nodes: Secondary | ICD-10-CM

## 2021-12-02 DIAGNOSIS — F17210 Nicotine dependence, cigarettes, uncomplicated: Secondary | ICD-10-CM | POA: Diagnosis not present

## 2021-12-02 HISTORY — PX: ESOPHAGOGASTRODUODENOSCOPY (EGD) WITH PROPOFOL: SHX5813

## 2021-12-02 HISTORY — PX: UPPER ESOPHAGEAL ENDOSCOPIC ULTRASOUND (EUS): SHX6562

## 2021-12-02 HISTORY — PX: BIOPSY: SHX5522

## 2021-12-02 LAB — GLUCOSE, CAPILLARY: Glucose-Capillary: 117 mg/dL — ABNORMAL HIGH (ref 70–99)

## 2021-12-02 SURGERY — UPPER ESOPHAGEAL ENDOSCOPIC ULTRASOUND (EUS)
Anesthesia: Monitor Anesthesia Care

## 2021-12-02 MED ORDER — OMEPRAZOLE 40 MG PO CPDR
40.0000 mg | DELAYED_RELEASE_CAPSULE | Freq: Two times a day (BID) | ORAL | 6 refills | Status: DC
Start: 2021-12-02 — End: 2021-12-07

## 2021-12-02 MED ORDER — LACTATED RINGERS IV SOLN: INTRAVENOUS | Status: DC

## 2021-12-02 MED ORDER — PROPOFOL 1000 MG/100ML IV EMUL
INTRAVENOUS | Status: AC
  Filled 2021-12-02: qty 100

## 2021-12-02 MED ORDER — LIDOCAINE 2% (20 MG/ML) 5 ML SYRINGE
INTRAMUSCULAR | Status: DC | PRN
  Administered 2021-12-02: 60 mg via INTRAVENOUS

## 2021-12-02 MED ORDER — PROPOFOL 10 MG/ML IV BOLUS
INTRAVENOUS | Status: DC | PRN
  Administered 2021-12-02: 50 mg via INTRAVENOUS
  Administered 2021-12-02: 150 mg via INTRAVENOUS
  Administered 2021-12-02: 20 mg via INTRAVENOUS

## 2021-12-02 MED ORDER — OXYCODONE HCL 10 MG PO TABS: 5.0000 mg | ORAL_TABLET | Freq: Four times a day (QID) | ORAL | 0 refills | Status: DC | PRN

## 2021-12-02 MED ORDER — SODIUM CHLORIDE 0.9 % IV SOLN: INTRAVENOUS | Status: DC

## 2021-12-02 MED ORDER — ONDANSETRON HCL 8 MG PO TABS: 8.0000 mg | ORAL_TABLET | Freq: Three times a day (TID) | ORAL | 1 refills | Status: DC | PRN

## 2021-12-02 MED ORDER — ONDANSETRON HCL 4 MG/2ML IJ SOLN
INTRAMUSCULAR | Status: DC | PRN
  Administered 2021-12-02: 4 mg via INTRAVENOUS

## 2021-12-02 MED ORDER — ROCURONIUM 10MG/ML (10ML) SYRINGE FOR MEDFUSION PUMP - OPTIME
INTRAVENOUS | Status: DC | PRN
  Administered 2021-12-02: 50 mg via INTRAVENOUS

## 2021-12-02 MED ORDER — SUGAMMADEX SODIUM 200 MG/2ML IV SOLN
INTRAVENOUS | Status: DC | PRN
  Administered 2021-12-02: 200 mg via INTRAVENOUS

## 2021-12-02 MED ORDER — DEXAMETHASONE SODIUM PHOSPHATE 10 MG/ML IJ SOLN
INTRAMUSCULAR | Status: DC | PRN
Start: 2021-12-02 — End: 2021-12-02
  Administered 2021-12-02: 10 mg via INTRAVENOUS

## 2021-12-02 MED ORDER — LACTATED RINGERS IV SOLN
INTRAVENOUS | Status: AC | PRN
  Administered 2021-12-02: 20 mL/h via INTRAVENOUS

## 2021-12-02 MED ORDER — PHENYLEPHRINE 40 MCG/ML (10ML) SYRINGE FOR IV PUSH (FOR BLOOD PRESSURE SUPPORT)
PREFILLED_SYRINGE | INTRAVENOUS | Status: DC | PRN
  Administered 2021-12-02 (×2): 80 ug via INTRAVENOUS

## 2021-12-02 MED ORDER — SUCRALFATE 1 GM/10ML PO SUSP: 1.0000 g | Freq: Two times a day (BID) | ORAL | 2 refills | Status: DC

## 2021-12-02 MED ORDER — GLUCAGON HCL RDNA (DIAGNOSTIC) 1 MG IJ SOLR
INTRAMUSCULAR | Status: AC
  Filled 2021-12-02: qty 1

## 2021-12-02 MED ORDER — PROPOFOL 500 MG/50ML IV EMUL
INTRAVENOUS | Status: DC | PRN
  Administered 2021-12-02: 150 ug/kg/min via INTRAVENOUS

## 2021-12-02 MED ORDER — CIPROFLOXACIN IN D5W 400 MG/200ML IV SOLN
INTRAVENOUS | Status: AC
  Filled 2021-12-02: qty 200

## 2021-12-02 MED ORDER — INDOMETHACIN 50 MG RE SUPP
RECTAL | Status: AC
  Filled 2021-12-02: qty 2

## 2021-12-02 SURGICAL SUPPLY — 15 items

## 2021-12-02 NOTE — Anesthesia Procedure Notes (Signed)
Procedure Name: Intubation Date/Time: 12/02/2021 10:49 AM Performed by: Maxwell Caul, CRNA Pre-anesthesia Checklist: Patient identified, Emergency Drugs available, Suction available and Patient being monitored Patient Re-evaluated:Patient Re-evaluated prior to induction Oxygen Delivery Method: Circle system utilized Preoxygenation: Pre-oxygenation with 100% oxygen Induction Type: IV induction Ventilation: Mask ventilation without difficulty Laryngoscope Size: Mac and 4 Grade View: Grade I Tube type: Oral Tube size: 7.5 mm Number of attempts: 1 Airway Equipment and Method: Stylet Placement Confirmation: ETT inserted through vocal cords under direct vision, positive ETCO2 and breath sounds checked- equal and bilateral Secured at: 21 cm Tube secured with: Tape Dental Injury: Teeth and Oropharynx as per pre-operative assessment

## 2021-12-02 NOTE — Anesthesia Procedure Notes (Signed)
Procedure Name: MAC Date/Time: 12/02/2021 10:30 AM Performed by: Maxwell Caul, CRNA Pre-anesthesia Checklist: Patient identified, Emergency Drugs available, Suction available and Patient being monitored Oxygen Delivery Method: Simple face mask

## 2021-12-02 NOTE — Transfer of Care (Signed)
Immediate Anesthesia Transfer of Care Note  Patient: Carlos [name]  Procedure(s) Performed: UPPER ESOPHAGEAL ENDOSCOPIC ULTRASOUND (EUS) ESOPHAGOGASTRODUODENOSCOPY (EGD) WITH PROPOFOL BIOPSY  Patient Location: PACU and Endoscopy Unit  Anesthesia Type:General  Level of Consciousness: awake, alert  and oriented  Airway & Oxygen Therapy: Patient Spontanous Breathing and Patient connected to face mask oxygen  Post-op Assessment: Report given to RN and Post -op Vital signs reviewed and stable  Post vital signs: Reviewed and stable  Last Vitals:  Vitals Value Taken Time  BP    Temp    Pulse    Resp 9 12/02/21 1200  SpO2    Vitals shown include unvalidated device data.  Last Pain:  Vitals:   12/02/21 0935  TempSrc: Temporal  PainSc: 0-No pain         Complications: No notable events documented.

## 2021-12-02 NOTE — H&P (Signed)
GASTROENTEROLOGY PROCEDURE H&P NOTE   Primary Care Physician: [name], MD  HPI: Jaevian Shean is a 36 y.o. male who presents for EGD/EUS to evaluate abdominal pain, history of pancreatitis, multiple peripancreatic pseudocysts, and attempt drainage of at least 1 to see if that helps from a symptom perspective in regards to nausea/vomiting/early satiety and weight loss.  Past Medical History:  Diagnosis Date   Anxiety    Diabetes (HCC)    Pancreatitis    Splenic vein thrombosis    Past Surgical History:  Procedure Laterality Date   NO PAST SURGERIES     Current Facility-Administered Medications  Medication Dose Route Frequency Provider Last Rate Last Admin   0.9 %  sodium chloride infusion   Intravenous Continuous [name], [name], MD       lactated ringers infusion   Intravenous Continuous [name], [name], MD        Current Facility-Administered Medications:    0.9 %  sodium chloride infusion, , Intravenous, Continuous, [name], [name], MD   lactated ringers infusion, , Intravenous, Continuous, [name], [name], MD Allergies  Allergen Reactions   Other Anaphylaxis    [name] syrup   Family History  Problem Relation Age of Onset   Hypertension Mother    Alcoholism Maternal Uncle    Diabetes Maternal Grandmother    Diabetes Maternal Grandfather    Diabetes Paternal Grandmother    Diabetes Paternal Grandfather    Colon cancer Neg Hx    Esophageal cancer Neg Hx    Inflammatory bowel disease Neg Hx    Liver disease Neg Hx    Pancreatic cancer Neg Hx    Rectal cancer Neg Hx    Stomach cancer Neg Hx    Social History   Socioeconomic History   Marital status: Single    Spouse name: Not on file   Number of children: 6   Years of education: Not on file   Highest education level: Not on file  Occupational History   Occupation: forklift driver  Tobacco Use   Smoking status: Every Day    Types: Cigars    Start date: 11/08/2002    Smokeless tobacco: Never   Tobacco comments:    Black and Mild- 2-3 a day, Started at age 66   Vaping Use   Vaping Use: Never used  Substance and Sexual Activity   Alcohol use: Not Currently   Drug use: Never   Sexual activity: Not on file  Other Topics Concern   Not on file  Social History Narrative   Not on file   Social Determinants of Health   Financial Resource Strain: Not on file  Food Insecurity: Not on file  Transportation Needs: Not on file  Physical Activity: Not on file  Stress: Not on file  Social Connections: Not on file  Intimate Partner Violence: Not on file    Physical Exam: There were no vitals filed for this visit. There is no height or weight on file to calculate BMI. GEN: NAD EYE: Sclerae anicteric ENT: MMM CV: Non-tachycardic GI: Soft, NT/ND NEURO:  Alert & Oriented x 3  Lab Results: No results for input(s): WBC, HGB, HCT, PLT in the last 72 hours. BMET No results for input(s): NA, K, CL, CO2, GLUCOSE, BUN, CREATININE, CALCIUM in the last 72 hours. LFT No results for input(s): PROT, ALBUMIN, AST, ALT, ALKPHOS, BILITOT, BILIDIR, IBILI in the last 72 hours. PT/INR No results for input(s): LABPROT, INR in the last 72 hours.  Impression / Plan: This is a 36 y.o.male who presents for EGD/EUS to evaluate abdominal pain, history of pancreatitis, multiple peripancreatic pseudocysts, and attempt drainage of at least 1 to see if that helps from a symptom perspective in regards to nausea/vomiting/early satiety and weight loss.  The risks of an EUS, including intestinal perforation, bleeding, infection, aspiration, and medication effects were discussed.  When a cystgastrostomy/cystenterostomy is performed as part of the EUS, there is an additional risk of pancreatitis at the rate of about 1-2%.  It was explained that procedure related pancreatitis is typically mild, although at times it can be severe and even life threatening.  The risks and benefits of  endoscopic evaluation/treatment were discussed with the patient and/or family; these include but are not limited to the risk of perforation, infection, bleeding, missed lesions, lack of diagnosis, severe illness requiring hospitalization, as well as anesthesia and sedation related illnesses.  The patient's history has been reviewed, patient examined, no change in status, and deemed stable for procedure.  The patient and/or family is agreeable to proceed.    [name], MD [facility] Advanced Endoscopy Office # [phone]

## 2021-12-02 NOTE — Anesthesia Postprocedure Evaluation (Signed)
Anesthesia Post Note  Patient: Carlos [name]  Procedure(s) Performed: UPPER ESOPHAGEAL ENDOSCOPIC ULTRASOUND (EUS) ESOPHAGOGASTRODUODENOSCOPY (EGD) WITH PROPOFOL BIOPSY     Patient location during evaluation: PACU Anesthesia Type: MAC Level of consciousness: awake and alert and oriented Pain management: pain level controlled Vital Signs Assessment: post-procedure vital signs reviewed and stable Respiratory status: spontaneous breathing, nonlabored ventilation and respiratory function stable Cardiovascular status: blood pressure returned to baseline Postop Assessment: no apparent nausea or vomiting Anesthetic complications: no   No notable events documented.  Last Vitals:  Vitals:   12/02/21 1228 12/02/21 1231  BP: (!) 153/101 125/90  Pulse:  87  Resp:  11  Temp:    SpO2:  100%    Last Pain:  Vitals:   12/02/21 1200  TempSrc: Axillary  PainSc: 0-No pain                 Marthenia Rolling

## 2021-12-02 NOTE — Discharge Instructions (Signed)
YOU HAD AN ENDOSCOPIC PROCEDURE TODAY: Refer to the procedure report and other information in the discharge instructions given to you for any specific questions about what was found during the examination. If this information does not answer your questions, please call [place] office at [phone] to clarify.   YOU SHOULD EXPECT: Some feelings of bloating in the abdomen. Passage of more gas than usual. Walking can help get rid of the air that was put into your GI tract during the procedure and reduce the bloating. If you had a lower endoscopy (such as a colonoscopy or flexible sigmoidoscopy) you may notice spotting of blood in your stool or on the toilet paper. Some abdominal soreness may be present for a day or two, also.  DIET: Your first meal following the procedure should be a light meal and then it is ok to progress to your normal diet. A half-sandwich or bowl of soup is an example of a good first meal. Heavy or fried foods are harder to digest and may make you feel nauseous or bloated. Drink plenty of fluids but you should avoid alcoholic beverages for 24 hours. If you had a esophageal dilation, please see attached instructions for diet.    ACTIVITY: Your care partner should take you home directly after the procedure. You should plan to take it easy, moving slowly for the rest of the day. You can resume normal activity the day after the procedure however YOU SHOULD NOT DRIVE, use power tools, machinery or perform tasks that involve climbing or major physical exertion for 24 hours (because of the sedation medicines used during the test).   SYMPTOMS TO REPORT IMMEDIATELY: A gastroenterologist can be reached at any hour. Please call [phone]  for any of the following symptoms:   Following upper endoscopy (EGD, EUS, ERCP, esophageal dilation) Vomiting of blood or coffee ground material  New, significant abdominal pain  New, significant chest pain or pain under the shoulder blades  Painful or  persistently difficult swallowing  New shortness of breath  Black, tarry-looking or red, bloody stools  FOLLOW UP:  If any biopsies were taken you will be contacted by phone or by letter within the next 1-3 weeks. Call [phone]  if you have not heard about the biopsies in 3 weeks.  Please also call with any specific questions about appointments or follow up tests.  

## 2021-12-02 NOTE — Anesthesia Preprocedure Evaluation (Addendum)
Anesthesia Evaluation  Patient identified by MRN, date of birth, ID band Patient awake    Reviewed: Allergy & Precautions, NPO status , Patient's Chart, lab work & pertinent test results  History of Anesthesia Complications Negative for: history of anesthetic complications  Airway Mallampati: II  TM Distance: >3 FB Neck ROM: Full    Dental no notable dental hx.    Pulmonary Current Smoker and Patient abstained from smoking.,    Pulmonary exam normal        Cardiovascular hypertension, Pt. on medications Normal cardiovascular exam     Neuro/Psych Anxiety negative neurological ROS     GI/Hepatic (+)     substance abuse  alcohol use, Pancreatitis   Endo/Other  diabetes, Insulin Dependent  Renal/GU negative Renal ROS  negative genitourinary   Musculoskeletal negative musculoskeletal ROS (+)   Abdominal   Peds  Hematology negative hematology ROS (+)   Anesthesia Other Findings Day of surgery medications reviewed with patient.  Reproductive/Obstetrics negative OB ROS                            Anesthesia Physical Anesthesia Plan  ASA: 3  Anesthesia Plan: General   Post-op Pain Management: Minimal or no pain anticipated   Induction: Intravenous  PONV Risk Score and Plan: 1 and Treatment may vary due to age or medical condition, Ondansetron, Dexamethasone and Midazolam  Airway Management Planned: Oral ETT  Additional Equipment: None  Intra-op Plan:   Post-operative Plan: Extubation in OR  Informed Consent: I have reviewed the patients History and Physical, chart, labs and discussed the procedure including the risks, benefits and alternatives for the proposed anesthesia with the patient or authorized representative who has indicated his/her understanding and acceptance.       Plan Discussed with: CRNA  Anesthesia Plan Comments:        Anesthesia Quick Evaluation

## 2021-12-02 NOTE — Op Note (Signed)
[facility] Patient Name: Carlos [name] Procedure Date: 12/02/2021 MRN: [id number] Attending MD: Justice Britain , MD Date of Birth: 11/26/1985 CSN: [id number] Age: 36 Admit Type: Outpatient Procedure:                Upper EUS Indications:              Pancreatic cyst on CT scan, Epigastric abdominal                            pain, Abdominal pain in the left upper quadrant,                            Generalized abdominal pain, Early satiety, Nausea                            with vomiting Providers:                Justice Britain, MD, Burtis Junes, RN, Luan Moore, Technician, Virgia Land, CRNA Referring MD:              Medicines:                General Anesthesia Complications:            No immediate complications. Estimated Blood Loss:     Estimated blood loss was minimal. Procedure:                Pre-Anesthesia Assessment:                           - Prior to the procedure, a History and Physical                            was performed, and patient medications and                            allergies were reviewed. The patient's tolerance of                            previous anesthesia was also reviewed. The risks                            and benefits of the procedure and the sedation                            options and risks were discussed with the patient.                            All questions were answered, and informed consent                            was obtained. Prior Anticoagulants: The patient has  taken no previous anticoagulant or antiplatelet                            agents. ASA Grade Assessment: II - A patient with                            mild systemic disease. After reviewing the risks                            and benefits, the patient was deemed in                            satisfactory condition to undergo the procedure.                           After obtaining  informed consent, the endoscope was                            passed under direct vision. Throughout the                            procedure, the patient's blood pressure, pulse, and                            oxygen saturations were monitored continuously. The                            [id number] ([id number]) Olympus therapeutic endoscope                            was introduced through the mouth, and advanced to                            the second part of duodenum. The [id number]                            ([id number]) Olympus duodenoscope was introduced                            through the mouth, and advanced to the area of                            papilla. The [id number] ([id number]) Olympus linear                            ultrasound scope was introduced through the mouth,                            and advanced to the duodenum for ultrasound                            examination from the stomach and duodenum. The  [id number] ([id number]) Olympus Forward View EUS was                            introduced through the mouth, and advanced to the                            duodenum for ultrasound examination from the                            stomach and duodenum. The upper EUS was technically                            difficult and complex due to unusual anatomy.                            Successful completion of the procedure was aided by                            performing the maneuvers documented (below) in this                            report. The patient tolerated the procedure. Scope In: Scope Out: Findings:      ENDOSCOPIC FINDING: :      White nummular lesions were noted in the proximal esophagus and in the       mid esophagus. Biopsies were taken with a cold forceps for histology.      LA Grade D, severe and erosive (one or more mucosal breaks involving at       least 75% of esophageal circumference) esophagitis with no bleeding was       found  in the distal esophagus.      The Z-line was irregular and was found 45 cm from the incisors.      A 1 cm hiatal hernia was present.      Multiple angulation deformities were noted within the stomach.      Patchy moderately erythematous mucosa without bleeding was found in the       entire examined stomach. Biopsies were taken with a cold forceps for       histology and Helicobacter pylori testing.      A moderate extrinsic deformity was found in the duodenal bulb.      However, normal mucosa was found in the duodenal bulb, in the first       portion of the duodenum and in the second portion of the duodenum.      The major papilla was normal.      ENDOSONOGRAPHIC FINDING: :      A septated lesion suggestive of a cyst was identified in the perisplenic       region was noted. The lesion measured 21 mm by 27 mm in maximal       cross-sectional diameter. There were a few compartments thinly septated.       The outer wall of the lesion was thin. There was no associated mass.       There was some internal debris within the fluid-filled cavity.       Aspiration not performed and cystgastrostomy not performed.      Pancreatic parenchymal abnormalities were noted  in the entire pancreas.       These consisted of diffuse echogenicity, lobularity with honeycombing,       hyperechoic foci without shadowing and hyperechoic strands. The pancreas       duct looks like there is evidence of pancreatic duct fillling defects       within the body region. However, the pancreatic duct in the head and       neck and tail are normal in size.      There was no sign of significant endosonographic abnormality in the       common bile duct and in the common hepatic duct. No stones and no       biliary sludge were identified.      Extensive hyperechoic material consistent with sludge was visualized       endosonographically in the gallbladder. I found this to be at       approximately 65-75 cm. This area was  followed into the duodenum bulb       and seems to be more consistent with the gallbladder rather than a       separate cyst (though on previous imaging, I would have thought this       could have been the case).      A few enlarged lymph nodes were visualized in the celiac region (level       20). The largest measured 9 mm by 6 mm in maximal cross-sectional       diameter. The nodes were triangular, isoechoic and had well defined       margins.      Endosonographic imaging in the visualized portion of the liver showed no       mass.      There was increased amount of perisplenic collaterals v varices present       in the region where the splenic vein would normally be visualized       endosonographically.      The celiac region was visualized.      The location of the patient's left kidney was around 55-60 cm, much       further than one would expect in normal patients (not clear why this is,       but it was causing an area of extrinsic impression on the stomach area       posteriorly) Impression:               EGD Impression:                           - White nummular lesions in esophageal mucosa.                            Biopsied.                           - LA Grade D erosive esophagitis with no bleeding                            distally.                           - Z-line irregular, 45 cm from the incisors.                           -  1 cm hiatal hernia.                           - Angulation deformities in the entire stomach.                           - Erythematous mucosa in the stomach. Biopsied.                           - Duodenal extrinsic deformity (seems to be the                            gallbladder based on the EUS more than anything                            else).                           - Normal mucosa was found in the duodenal bulb, in                            the first portion of the duodenum and in the second                            portion of the  duodenum.                           - Normal major papilla.                           EUS Impression:                           - A perisplenic cystic lesion was noted.                           - Pancreatic parenchymal abnormalities consisting                            of diffuse echogenicity, lobularity with                            honeycombing, hyperechoic foci without shadowing                            and hyperechoic strands were noted in the entire                            pancreas. This patient has chronic pancreatitis.                            The parenchyma is concerning for significant                            necrosis, but not clear that I have an ability to  safely perform a cystgastrostomy or enterostomy.                           - As noted above, the patient now has had                            significant changes in the way the previous CT                            looks compared to now, so we did not perform any                            other interventions. He will need more imaging.                           - There was no sign of significant pathology in the                            common bile duct and in the common hepatic duct.                           - Hyperechoic material consistent with sludge was                            visualized endosonographically in the gallbladder.                            This was in a region consistent with the                            gallbladder though small chance this could have                            been a peripancreatic fluid collection in that area                            of the RUQ as well.                           - A few enlarged lymph nodes were visualized in the                            celiac region (level 20). Tissue has not been                            obtained. However, the endosonographic appearance                            is consistent with benign  inflammatory changes.                           - Endosonographically, there was normal vascular  flow in the splenic vein.                           - Endosonographic images of the left kidney were                            unremarkable. Moderate Sedation:      Not Applicable - Patient had care per Anesthesia. Recommendation:           - The patient will be observed post-procedure,                            until all discharge criteria are met.                           - Discharge patient to home.                           - Patient has a contact number available for                            emergencies. The signs and symptoms of potential                            delayed complications were discussed with the                            patient. Return to normal activities tomorrow.                            Written discharge instructions were provided to the                            patient.                           - Low fat diet.                           - Initiate Omeprazole 40 mg twice daily.                           - Carafate 1 g BID (liquid v slurry) to aid in                            esophageal healing.                           - Zofran 8 mg Q8H PRN prescription given if nausea                            becomes significant.                           - Short course of Oxycodone 5-10 mg to help patient  with acute pain in the coming days. No further                            opioid therapy to be given at this time, but                            hopeful we can give some assistance in the near                            future while additional workup and imaging is                            performed to see next steps in treatment.                           - Obtain a CT-Abdomen Pancreas Protocol next                            available. This will help define what is occuring                            within  the abdomen as there has been significant                            changes noted in the previous imaging to now                            (nearly 2 months).                           - I cannot find an adequate area for                            pseudocystgastrostomy safely with AXIOS. Could                            require pigtail derived cystgastrostomy (though I                            think this is more likely to still just be the                            gallbladder that we are seeing today as discussed                            above). However, we need to get more imaging. He                            will likely need referral to a quaternary center to                            see if they may be able to help with  cystenterostomy if imaging still shows this. My                            biggest concern now is that the way the pancreas                            looks, it is almost as if the majority of it is                            liquifactive/necrotic, and it is not healthy in                            appearance definitely. His progressive DM diagnosis                            as well as some EPI symptoms suggest this. I'm not                            sure that drainage procedures right now will be                            helpful, but we will need to see where things stand.                           - The findings and recommendations were discussed                            with the patient.                           - The findings and recommendations were discussed                            with the designated responsible adult. Procedure Code(s):        --- Professional ---                           [phone], Esophagogastroduodenoscopy, flexible,                            transoral; with endoscopic ultrasound examination                            limited to the esophagus, stomach or duodenum, and                             adjacent structures                           43239, Esophagogastroduodenoscopy, flexible,                            transoral; with biopsy, single or multiple Diagnosis Code(s):        --- Professional ---  K22.8, Other specified diseases of esophagus                           K20.80, Other esophagitis without bleeding                           K44.9, Diaphragmatic hernia without obstruction or                            gangrene                           K31.89, Other diseases of stomach and duodenum                           K86.2, Cyst of pancreas                           K86.9, Disease of pancreas, unspecified                           R59.0, Localized enlarged lymph nodes                           R10.13, Epigastric pain                           R10.12, Left upper quadrant pain                           R10.84, Generalized abdominal pain                           R68.81, Early satiety                           R11.2, Nausea with vomiting, unspecified                           K83.8, Other specified diseases of biliary tract CPT copyright 2019 [employer]. All rights reserved. The codes documented in this report are preliminary and upon coder review may  be revised to meet current compliance requirements. Justice Britain, MD 12/02/2021 12:18:13 PM Number of Addenda: 0

## 2021-12-03 ENCOUNTER — Encounter (HOSPITAL_COMMUNITY): Payer: Self-pay | Admitting: Gastroenterology

## 2021-12-03 ENCOUNTER — Other Ambulatory Visit: Payer: Self-pay

## 2021-12-03 ENCOUNTER — Encounter: Payer: Self-pay | Admitting: Gastroenterology

## 2021-12-03 DIAGNOSIS — K863 Pseudocyst of pancreas: Secondary | ICD-10-CM

## 2021-12-03 LAB — SURGICAL PATHOLOGY

## 2021-12-03 MED ORDER — FLUCONAZOLE 200 MG PO TABS: ORAL_TABLET | ORAL | 0 refills | Status: AC

## 2021-12-07 ENCOUNTER — Inpatient Hospital Stay (HOSPITAL_COMMUNITY)
Admission: EM | Admit: 2021-12-07 | Discharge: 2021-12-10 | DRG: 438 | Disposition: A | Discharge status: Home / Self Care | Payer: Managed Care, Other (non HMO) | Attending: Family Medicine | Admitting: Family Medicine

## 2021-12-07 ENCOUNTER — Encounter (HOSPITAL_COMMUNITY): Payer: Self-pay

## 2021-12-07 ENCOUNTER — Other Ambulatory Visit: Payer: Self-pay

## 2021-12-07 ENCOUNTER — Emergency Department (HOSPITAL_COMMUNITY): Payer: Managed Care, Other (non HMO)

## 2021-12-07 DIAGNOSIS — E1010 Type 1 diabetes mellitus with ketoacidosis without coma: Secondary | ICD-10-CM | POA: Diagnosis present

## 2021-12-07 DIAGNOSIS — Z86718 Personal history of other venous thrombosis and embolism: Secondary | ICD-10-CM | POA: Diagnosis not present

## 2021-12-07 DIAGNOSIS — F1010 Alcohol abuse, uncomplicated: Secondary | ICD-10-CM | POA: Diagnosis present

## 2021-12-07 DIAGNOSIS — Z8249 Family history of ischemic heart disease and other diseases of the circulatory system: Secondary | ICD-10-CM

## 2021-12-07 DIAGNOSIS — K760 Fatty (change of) liver, not elsewhere classified: Secondary | ICD-10-CM | POA: Diagnosis present

## 2021-12-07 DIAGNOSIS — E871 Hypo-osmolality and hyponatremia: Secondary | ICD-10-CM | POA: Diagnosis present

## 2021-12-07 DIAGNOSIS — K863 Pseudocyst of pancreas: Secondary | ICD-10-CM | POA: Diagnosis present

## 2021-12-07 DIAGNOSIS — F419 Anxiety disorder, unspecified: Secondary | ICD-10-CM | POA: Diagnosis present

## 2021-12-07 DIAGNOSIS — K2210 Ulcer of esophagus without bleeding: Secondary | ICD-10-CM | POA: Diagnosis present

## 2021-12-07 DIAGNOSIS — Z833 Family history of diabetes mellitus: Secondary | ICD-10-CM

## 2021-12-07 DIAGNOSIS — E1069 Type 1 diabetes mellitus with other specified complication: Secondary | ICD-10-CM

## 2021-12-07 DIAGNOSIS — E876 Hypokalemia: Secondary | ICD-10-CM | POA: Diagnosis present

## 2021-12-07 DIAGNOSIS — K828 Other specified diseases of gallbladder: Secondary | ICD-10-CM | POA: Diagnosis present

## 2021-12-07 DIAGNOSIS — R188 Other ascites: Secondary | ICD-10-CM | POA: Diagnosis present

## 2021-12-07 DIAGNOSIS — R1013 Epigastric pain: Secondary | ICD-10-CM

## 2021-12-07 DIAGNOSIS — Z79899 Other long term (current) drug therapy: Secondary | ICD-10-CM | POA: Diagnosis not present

## 2021-12-07 DIAGNOSIS — E109 Type 1 diabetes mellitus without complications: Secondary | ICD-10-CM | POA: Diagnosis present

## 2021-12-07 DIAGNOSIS — K861 Other chronic pancreatitis: Secondary | ICD-10-CM | POA: Diagnosis present

## 2021-12-07 DIAGNOSIS — I82890 Acute embolism and thrombosis of other specified veins: Secondary | ICD-10-CM | POA: Diagnosis present

## 2021-12-07 DIAGNOSIS — K2970 Gastritis, unspecified, without bleeding: Secondary | ICD-10-CM | POA: Diagnosis present

## 2021-12-07 DIAGNOSIS — K859 Acute pancreatitis without necrosis or infection, unspecified: Secondary | ICD-10-CM | POA: Diagnosis not present

## 2021-12-07 DIAGNOSIS — F17290 Nicotine dependence, other tobacco product, uncomplicated: Secondary | ICD-10-CM | POA: Diagnosis present

## 2021-12-07 DIAGNOSIS — K219 Gastro-esophageal reflux disease without esophagitis: Secondary | ICD-10-CM | POA: Diagnosis present

## 2021-12-07 DIAGNOSIS — R1011 Right upper quadrant pain: Secondary | ICD-10-CM | POA: Insufficient documentation

## 2021-12-07 DIAGNOSIS — B3781 Candidal esophagitis: Secondary | ICD-10-CM | POA: Diagnosis present

## 2021-12-07 DIAGNOSIS — Z794 Long term (current) use of insulin: Secondary | ICD-10-CM

## 2021-12-07 DIAGNOSIS — K869 Disease of pancreas, unspecified: Secondary | ICD-10-CM | POA: Diagnosis present

## 2021-12-07 DIAGNOSIS — R7401 Elevation of levels of liver transaminase levels: Secondary | ICD-10-CM | POA: Diagnosis not present

## 2021-12-07 DIAGNOSIS — Z20822 Contact with and (suspected) exposure to covid-19: Secondary | ICD-10-CM | POA: Diagnosis present

## 2021-12-07 DIAGNOSIS — Z9109 Other allergy status, other than to drugs and biological substances: Secondary | ICD-10-CM

## 2021-12-07 DIAGNOSIS — G8929 Other chronic pain: Secondary | ICD-10-CM | POA: Diagnosis present

## 2021-12-07 DIAGNOSIS — E1065 Type 1 diabetes mellitus with hyperglycemia: Secondary | ICD-10-CM | POA: Diagnosis present

## 2021-12-07 LAB — URINALYSIS, MICROSCOPIC (REFLEX)

## 2021-12-07 LAB — URINALYSIS, ROUTINE W REFLEX MICROSCOPIC
Bilirubin Urine: NEGATIVE
Glucose, UA: >=500 mg/dL — AB
Hgb urine dipstick: NEGATIVE
Ketones, ur: NEGATIVE mg/dL
Leukocytes,Ua: NEGATIVE
Nitrite: NEGATIVE
Protein, ur: NEGATIVE mg/dL
Specific Gravity, Urine: 1.01 (ref 1.005–1.030)
pH: 6 (ref 5.0–8.0)

## 2021-12-07 LAB — COMPREHENSIVE METABOLIC PANEL
ALT: 298 U/L — ABNORMAL HIGH (ref 0–44)
AST: 155 U/L — ABNORMAL HIGH (ref 15–41)
Albumin: 3.5 g/dL (ref 3.5–5.0)
Alkaline Phosphatase: 907 U/L — ABNORMAL HIGH (ref 38–126)
Anion gap: 12 (ref 5–15)
BUN: 6 mg/dL (ref 6–20)
CO2: 24 mmol/L (ref 22–32)
Calcium: 9 mg/dL (ref 8.9–10.3)
Chloride: 94 mmol/L — ABNORMAL LOW (ref 98–111)
Creatinine, Ser: 0.81 mg/dL (ref 0.61–1.24)
GFR, Estimated: >60 mL/min (ref >60)
Glucose, Bld: 231 mg/dL — ABNORMAL HIGH (ref 70–99)
Potassium: 3.2 mmol/L — ABNORMAL LOW (ref 3.5–5.1)
Sodium: 130 mmol/L — ABNORMAL LOW (ref 135–145)
Total Bilirubin: 1.3 mg/dL — ABNORMAL HIGH (ref 0.3–1.2)
Total Protein: 6.9 g/dL (ref 6.5–8.1)

## 2021-12-07 LAB — CBC
HCT: 37.5 % — ABNORMAL LOW (ref 39.0–52.0)
Hemoglobin: 13 g/dL (ref 13.0–17.0)
MCH: 36.7 pg — ABNORMAL HIGH (ref 26.0–34.0)
MCHC: 34.7 g/dL (ref 30.0–36.0)
MCV: 105.9 fL — ABNORMAL HIGH (ref 80.0–100.0)
Platelets: 400 10*3/uL (ref 150–400)
RBC: 3.54 MIL/uL — ABNORMAL LOW (ref 4.22–5.81)
RDW: 12.3 % (ref 11.5–15.5)
WBC: 5.1 10*3/uL (ref 4.0–10.5)
nRBC: 0 % (ref 0.0–0.2)

## 2021-12-07 LAB — MAGNESIUM: Magnesium: 1.6 mg/dL — ABNORMAL LOW (ref 1.7–2.4)

## 2021-12-07 LAB — CBG MONITORING, ED
Glucose-Capillary: 213 mg/dL — ABNORMAL HIGH (ref 70–99)
Glucose-Capillary: 296 mg/dL — ABNORMAL HIGH (ref 70–99)

## 2021-12-07 LAB — LIPASE, BLOOD: Lipase: 35 U/L (ref 11–51)

## 2021-12-07 MED ORDER — ALUM & MAG HYDROXIDE-SIMETH 200-200-20 MG/5ML PO SUSP
30.0000 mL | Freq: Once | ORAL | Status: AC
  Administered 2021-12-07: 30 mL via ORAL
  Filled 2021-12-07: qty 30

## 2021-12-07 MED ORDER — ACETAMINOPHEN 325 MG PO TABS: 650.0000 mg | ORAL_TABLET | Freq: Four times a day (QID) | ORAL | Status: DC | PRN

## 2021-12-07 MED ORDER — OXYCODONE HCL 5 MG PO TABS
5.0000 mg | ORAL_TABLET | Freq: Four times a day (QID) | ORAL | Status: DC | PRN
  Administered 2021-12-08 – 2021-12-09 (×6): 10 mg via ORAL
  Administered 2021-12-10: 5 mg via ORAL
  Filled 2021-12-07 (×7): qty 2

## 2021-12-07 MED ORDER — ONDANSETRON HCL 4 MG PO TABS: 4.0000 mg | ORAL_TABLET | Freq: Four times a day (QID) | ORAL | Status: DC | PRN

## 2021-12-07 MED ORDER — INSULIN ASPART 100 UNIT/ML IJ SOLN
4.0000 [IU] | Freq: Three times a day (TID) | INTRAMUSCULAR | Status: DC
  Administered 2021-12-07 – 2021-12-10 (×6): 4 [IU] via SUBCUTANEOUS

## 2021-12-07 MED ORDER — PANTOPRAZOLE SODIUM 40 MG IV SOLR
40.0000 mg | Freq: Two times a day (BID) | INTRAVENOUS | Status: DC
  Administered 2021-12-07 – 2021-12-10 (×6): 40 mg via INTRAVENOUS
  Filled 2021-12-07 (×6): qty 40

## 2021-12-07 MED ORDER — INSULIN GLARGINE-YFGN 100 UNIT/ML ~~LOC~~ SOLN
12.0000 [IU] | Freq: Every day | SUBCUTANEOUS | Status: DC
  Administered 2021-12-07 – 2021-12-10 (×3): 12 [IU] via SUBCUTANEOUS
  Filled 2021-12-07 (×5): qty 0.12

## 2021-12-07 MED ORDER — IOHEXOL 300 MG/ML  SOLN
80.0000 mL | Freq: Once | INTRAMUSCULAR | Status: AC | PRN
  Administered 2021-12-07: 80 mL via INTRAVENOUS

## 2021-12-07 MED ORDER — MORPHINE SULFATE (PF) 4 MG/ML IV SOLN
4.0000 mg | Freq: Once | INTRAVENOUS | Status: AC
  Administered 2021-12-07: 4 mg via INTRAVENOUS
  Filled 2021-12-07: qty 1

## 2021-12-07 MED ORDER — MORPHINE SULFATE (PF) 2 MG/ML IV SOLN
2.0000 mg | INTRAVENOUS | Status: DC | PRN
  Administered 2021-12-07 – 2021-12-10 (×10): 2 mg via INTRAVENOUS
  Filled 2021-12-07 (×10): qty 1

## 2021-12-07 MED ORDER — FLUCONAZOLE 100 MG PO TABS
200.0000 mg | ORAL_TABLET | Freq: Every day | ORAL | Status: DC
  Administered 2021-12-08 – 2021-12-10 (×3): 200 mg via ORAL
  Filled 2021-12-07 (×3): qty 2

## 2021-12-07 MED ORDER — INSULIN ASPART 100 UNIT/ML IJ SOLN
0.0000 [IU] | Freq: Three times a day (TID) | INTRAMUSCULAR | Status: DC
  Administered 2021-12-07: 3 [IU] via SUBCUTANEOUS
  Administered 2021-12-08 (×2): 1 [IU] via SUBCUTANEOUS
  Administered 2021-12-09: 5 [IU] via SUBCUTANEOUS
  Administered 2021-12-10: 2 [IU] via SUBCUTANEOUS
  Administered 2021-12-10: 5 [IU] via SUBCUTANEOUS

## 2021-12-07 MED ORDER — ENOXAPARIN SODIUM 40 MG/0.4ML IJ SOSY
40.0000 mg | PREFILLED_SYRINGE | INTRAMUSCULAR | Status: DC
  Administered 2021-12-07 – 2021-12-10 (×4): 40 mg via SUBCUTANEOUS
  Filled 2021-12-07 (×4): qty 0.4

## 2021-12-07 MED ORDER — SUCRALFATE 1 GM/10ML PO SUSP
1.0000 g | Freq: Two times a day (BID) | ORAL | Status: DC
  Administered 2021-12-07 – 2021-12-10 (×6): 1 g via ORAL
  Filled 2021-12-07 (×6): qty 10

## 2021-12-07 MED ORDER — ALBUTEROL SULFATE (2.5 MG/3ML) 0.083% IN NEBU: 2.5000 mg | INHALATION_SOLUTION | Freq: Four times a day (QID) | RESPIRATORY_TRACT | Status: DC | PRN

## 2021-12-07 MED ORDER — FLUCONAZOLE 200 MG PO TABS
400.0000 mg | ORAL_TABLET | Freq: Once | ORAL | Status: AC
  Administered 2021-12-07: 400 mg via ORAL
  Filled 2021-12-07: qty 2

## 2021-12-07 MED ORDER — SODIUM CHLORIDE 0.9 % IV BOLUS
1000.0000 mL | Freq: Once | INTRAVENOUS | Status: AC
  Administered 2021-12-07: 1000 mL via INTRAVENOUS

## 2021-12-07 MED ORDER — FAMOTIDINE IN NACL 20-0.9 MG/50ML-% IV SOLN
20.0000 mg | Freq: Once | INTRAVENOUS | Status: AC
Start: 2021-12-07 — End: 2021-12-07
  Administered 2021-12-07: 20 mg via INTRAVENOUS
  Filled 2021-12-07: qty 50

## 2021-12-07 MED ORDER — SODIUM CHLORIDE 0.9 % IV SOLN: INTRAVENOUS | Status: DC

## 2021-12-07 MED ORDER — POTASSIUM CHLORIDE 10 MEQ/100ML IV SOLN
10.0000 meq | INTRAVENOUS | Status: AC
  Administered 2021-12-07 – 2021-12-08 (×2): 10 meq via INTRAVENOUS
  Filled 2021-12-07: qty 100

## 2021-12-07 MED ORDER — POTASSIUM CHLORIDE 10 MEQ/100ML IV SOLN
INTRAVENOUS | Status: AC
  Administered 2021-12-07: 10 meq via INTRAVENOUS
  Filled 2021-12-07: qty 200

## 2021-12-07 MED ORDER — ACETAMINOPHEN 650 MG RE SUPP: 650.0000 mg | Freq: Four times a day (QID) | RECTAL | Status: DC | PRN

## 2021-12-07 MED ORDER — PANCRELIPASE (LIP-PROT-AMYL) 36000-114000 UNITS PO CPEP
72000.0000 [IU] | ORAL_CAPSULE | Freq: Three times a day (TID) | ORAL | Status: DC
  Administered 2021-12-07 – 2021-12-10 (×4): 72000 [IU] via ORAL
  Filled 2021-12-07 (×8): qty 2

## 2021-12-07 MED ORDER — LIDOCAINE VISCOUS HCL 2 % MT SOLN
15.0000 mL | Freq: Once | OROMUCOSAL | Status: AC
  Administered 2021-12-07: 15 mL via ORAL
  Filled 2021-12-07: qty 15

## 2021-12-07 MED ORDER — ONDANSETRON HCL 4 MG/2ML IJ SOLN: 4.0000 mg | Freq: Four times a day (QID) | INTRAMUSCULAR | Status: DC | PRN

## 2021-12-07 MED ORDER — INSULIN ASPART 100 UNIT/ML FLEXPEN
4.0000 [IU] | PEN_INJECTOR | Freq: Three times a day (TID) | SUBCUTANEOUS | Status: DC
  Filled 2021-12-07: qty 3

## 2021-12-07 NOTE — Consult Note (Signed)
Consultation  Referring Provider:   Dr. Pattricia Boss Primary Care Physician:  [name], MD Primary Gastroenterologist:  Dr. Rush Landmark       Reason for Consultation:    Epigastric pain s/p EUS 01/25   Impression    Acute on Chronic Pancreatitis with history of complication pseudocyst, splenic vein thrombosis, new onset diabetes presents with epigastric pain s/p EUS/EGD CT shows resolving complications, lipase negative with acute pancreatitis changes on CT with gallbladder distension LIPASE 35 WBC 5.1 HGB 13.0 MCV 105.9 Platelets 400 BUN 6 Cr 0.81  GFR >60  Calcium 9.0 Glucose 231 Declines ETOH use since November but had significant years prior to that since age of 11, still smokes 1 black and mild a day.   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  Gastritis negative H. Pylori Avoid NSAIDs  Hyponatremia/hypokalemia  Potassium 3.2  Magnesium 2.0  replete    Plan   Daily CMET, CBC Consider Immunoglobulin profile (IgG4), history of positive ANA 09/2020 no reflex seen, negative HIV 09/2020 -Continue pantoprazole 40 mg twice daily - Carafate 1 g 2 times daily -Negative lipase, significant elevation of liver function, some right upper quadrant pain. Most likely this is related to chronic pancreatitis but with his labs can get right upper quadrant ultrasound to rule out gallstone pancreatitis. -Continue Creon 2 tablets 3 times daily -Start patient on fluconazole for esophageal candidiasis seen on EGD, never started.  -fluid replacement -Pain control per the inpatient medical team, avoid oral NSAIDs, can consider Toradol -Encourage early ambulation -Advance diet as tolerated.  -Smoking and alcohol cessation counseling.  Dr. Lorenso Courier will see the patient  and give further recommendations. Thank you for your kind consultation, we will continue to follow.         HPI:   Carlos [name] is a 36 y.o. 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 Patient is well-known to Dr. Rush Landmark status post EGD/EUS for pancreatic pseudocyst on 12/02/2021.  Patient was found to have grade D erosive esophagitis pathology showed Candida, 1 cm hiatal hernia, gastritis negative H. pylori any, no dysplasia..  EUS showed chronic pancreatitis with parenchymal loss concerning for significant necrosis causing cystgastrostomy/enterostomy to be unsafe to perform.  He was sent home on omeprazole 40 mg twice daily, Carafate slurry twice daily for esophageal healing, Zofran, oxycodone, and repeat CT abdomen pancreas protocol.  Patient feels he has had an eat or 9 pain level since November blood pressure admission. Epigastric right upper quadrant pain with some right flank pain. Hello vomiting periodically at 2 or 1 hour after eating.  States he gets full very quickly.  States emesis is nonbloody, mostly liquid and some digested food. Patient states he had weight loss prior to November but since that admission and likely being on Creon has helped increase his weight.  Patient is on Creon 2 pills 3 times daily. Patient's last bowel movement was yesterday afternoon, soft, no straining, denies melena or hematochezia. Patient's had some hoarseness, denies odynophagia, dysphagia.  Has rare reflux.  Denies any prednisone or inhaled steroid use. Patient has had some night sweats, but also using heating pad at night for abdominal pain.  Takes ibuprofen 400 mg twice daily for pain, states this does not help that much. Patient has not had any alcohol use since November, no drug use, has cut  back on cigarette smoking but still smokes 1 black a mile daily. Patient denies family history of pancreatitis or gallstones.  Does have family history of diabetes.  ER findings sodium 130, potassium 3.2, glucose 231, alkaline phosphatase 907 (127), lipase 35, AST 155, ALT 298(18, 162), total bilirubin 1.3 (0.8), white blood cell count 5.1, hemoglobin 13, MCV 105.9 CT scan showed interval changes of acute pancreatitis, significant decrease in pancreatic tail pseudocyst, stable changes of chronic pancreatitis and gallbladder distention.  GI imaging 12/06/21 CT AB and pelvis with contrast 1. Interval changes of acute pancreatitis. 2. Interval significant decrease in size of a pancreatic tail pseudocyst with resolution of other previously demonstrated pseudocysts. 3. Stable changes of chronic pancreatitis. 4. Gallbladder distension and mild pericholecystic fluid, most likely due to the adjacent changes of acute pancreatitis.  EGD/EUS 12/02/2021 EGD Impression: - White nummular lesions in esophageal mucosa. Biopsied. - LA Grade D erosive esophagitis with no bleeding distally. - Z-line irregular, 45 cm from the incisors. - 1 cm hiatal hernia. - Angulation deformities in the entire stomach. - Erythematous mucosa in the stomach. Biopsied. - Duodenal extrinsic deformity (seems to be the gallbladder based on the EUS more than anything else). - Normal mucosa was found in the duodenal bulb, in the first portion of the duodenum and in the second portion of the duodenum. - Normal major papilla. EUS Impression: - A perisplenic cystic lesion was noted. - Pancreatic parenchymal abnormalities consisting of diffuse echogenicity, lobularity with honeycombing, hyperechoic foci without shadowing and hyperechoic strands were noted in the entire pancreas. This patient has chronic pancreatitis. The parenchyma is concerning for significant necrosis, but not clear that I have an ability to safely perform  a cystgastrostomy or enterostomy. - As noted above, the patient now has had significant changes in the way the previous CT looks compared to now, so we did not perform any other interventions. He will need more imaging. - There was no sign of significant pathology in the common bile duct and in the common hepatic duct. - Hyperechoic material consistent with sludge was visualized endosonographically in the gallbladder. This was in a region consistent with the gallbladder though small chance this could have been a peripancreatic fluid collection in that area of the RUQ as well. - A few enlarged lymph nodes were visualized in the celiac region (level 20). Tissue has not been obtained. However, the endosonographic appearance is consistent with benign inflammatory changes. - Endosonographically, there was normal vascular flow in the splenic vein. - Endosonographic images of the left kidney were unremarkable.  10/07/21 CT AB and pelvis with contrast 1. Signs of chronic pancreatitis with interval resolution of previous peripancreatic edema and free fluid. 2. 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 These have increased in size from previous exam. 3. Pseudocysts exhibit mass effect upon the splenic vein, which is significantly narrowed resulting in interval development of upper abdominal collaterals. 4. Small amount of free fluid noted within the dependent portion of the pelvis.  09/14/21 CT AB and pelvis with contrast 1. Significant increase in retroperitoneal edema, most likely due to interval acute changes of pancreatitis. 2. Progressive pseudocyst formation, as described above. The only incompletely brand new fluid collection is medial to the spleen and gastric fundus with multiple smaller fluid collections in that area  with mild stranding in the adjacent edematous tissues. This is also most likely a developing pseudocyst but the possibility of  abscess formation would need to be excluded clinically. 3. Mildly progressive gallbladder dilatation. 4. Moderate amount of pelvic ascites. 5. Diffuse hepatic steatosis.    Past Medical History:  Diagnosis Date   Anxiety    Diabetes ([place])    Pancreatitis    Splenic vein thrombosis     Surgical History:  He  has a past surgical history that includes No past surgeries; Upper esophageal endoscopic ultrasound (eus) (N/A, 12/02/2021); Esophagogastroduodenoscopy (egd) with propofol (N/A, 12/02/2021); and biopsy (12/02/2021). Family History:  His family history includes Alcoholism in his maternal uncle; Diabetes in his maternal grandfather, maternal grandmother, paternal grandfather, and paternal grandmother; Hypertension in his mother. Social History:   reports that he has been smoking cigars. He started smoking about 19 years ago. He has never used smokeless tobacco. He reports that he does not currently use alcohol. He reports that he does not use drugs.  Prior to Admission medications   Medication Sig Start Date End Date Taking? Authorizing Provider  blood glucose meter kit and supplies KIT Dispense based on patient and insurance preference. Use up to four times daily as directed. 09/16/21   Alma Friendly, MD  fluconazole (DIFLUCAN) 200 MG tablet Take 2 tablets (400 mg total) by mouth daily for 1 day, THEN 1 tablet (200 mg total) daily for 13 days. 12/03/21 12/17/21  [name], Telford Nab., MD  ibuprofen (ADVIL) 200 MG tablet Take 400-600 mg by mouth every 6 (six) hours as needed for mild pain.    [provider]  insulin aspart (NOVOLOG) 100 UNIT/ML FlexPen Inject 4 Units into the skin 3 (three) times daily with meals. 09/16/21   Alma Friendly, MD  insulin glargine (LANTUS) 100 UNIT/ML Solostar Pen Inject 12 Units into the skin daily. 09/16/21   Alma Friendly, MD  Insulin Pen Needle (PEN NEEDLES 3/16") 31G X 5 MM MISC Use as directed with insulin pen 09/16/21    Alma Friendly, MD  lipase/protease/amylase (CREON) 36000 UNITS CPEP capsule Take 2 capsules (72,000 Units total) by mouth 3 (three) times daily with meals. May also take 1 capsule (36,000 Units total) as needed (with snacks). Patient taking differently: Take 2 capsules (72000 units) by mouth daily with breakfast and supper, take 1 capsule (36000 units) with lunch/midday snack 08/31/21   [name], Telford Nab., MD  lisinopril (ZESTRIL) 5 MG tablet Take 1 tablet (5 mg total) by mouth daily. 09/28/21   [name], MD  Multiple Vitamins-Minerals (CENTRUM) tablet Take 1 tablet by mouth every morning.    [provider]  omeprazole (PRILOSEC) 40 MG capsule Take 1 capsule (40 mg total) by mouth 2 (two) times daily before a meal. 12/02/21   [name], Telford Nab., MD  ondansetron (ZOFRAN) 8 MG tablet Take 1 tablet (8 mg total) by mouth every 8 (eight) hours as needed for nausea or vomiting. 12/02/21   [name], Telford Nab., MD  oxyCODONE 10 MG TABS Take 0.5-1 tablets (5-10 mg total) by mouth every 6 (six) hours as needed for up to 5 days for severe pain. 12/02/21 12/07/21  [name], Telford Nab., MD  sucralfate (CARAFATE) 1 GM/10ML suspension Take 10 mLs (1 g total) by mouth 2 (two) times daily. 12/02/21 02/03/22  [name], Telford Nab., MD    No current facility-administered medications for this encounter.   Current Outpatient Medications  Medication Sig Dispense Refill   blood  glucose meter kit and supplies KIT Dispense based on patient and insurance preference. Use up to four times daily as directed. 1 each 0   fluconazole (DIFLUCAN) 200 MG tablet Take 2 tablets (400 mg total) by mouth daily for 1 day, THEN 1 tablet (200 mg total) daily for 13 days. 15 tablet 0   ibuprofen (ADVIL) 200 MG tablet Take 400-600 mg by mouth every 6 (six) hours as needed for mild pain.     insulin aspart (NOVOLOG) 100 UNIT/ML FlexPen Inject 4 Units into the skin 3 (three) times daily with meals. 15  mL 0   insulin glargine (LANTUS) 100 UNIT/ML Solostar Pen Inject 12 Units into the skin daily. 15 mL 0   Insulin Pen Needle (PEN NEEDLES 3/16") 31G X 5 MM MISC Use as directed with insulin pen 100 each 0   lipase/protease/amylase (CREON) 36000 UNITS CPEP capsule Take 2 capsules (72,000 Units total) by mouth 3 (three) times daily with meals. May also take 1 capsule (36,000 Units total) as needed (with snacks). (Patient taking differently: Take 2 capsules (72000 units) by mouth daily with breakfast and supper, take 1 capsule (36000 units) with lunch/midday snack) 240 capsule 11   lisinopril (ZESTRIL) 5 MG tablet Take 1 tablet (5 mg total) by mouth daily. 90 tablet 3   Multiple Vitamins-Minerals (CENTRUM) tablet Take 1 tablet by mouth every morning.     omeprazole (PRILOSEC) 40 MG capsule Take 1 capsule (40 mg total) by mouth 2 (two) times daily before a meal. 60 capsule 6   ondansetron (ZOFRAN) 8 MG tablet Take 1 tablet (8 mg total) by mouth every 8 (eight) hours as needed for nausea or vomiting. 30 tablet 1   oxyCODONE 10 MG TABS Take 0.5-1 tablets (5-10 mg total) by mouth every 6 (six) hours as needed for up to 5 days for severe pain. 20 tablet 0   sucralfate (CARAFATE) 1 GM/10ML suspension Take 10 mLs (1 g total) by mouth 2 (two) times daily. 420 mL 2    Allergies as of 12/07/2021 - Review Complete 12/07/2021  Allergen Reaction Noted   Other Anaphylaxis 09/26/2020    Review of Systems:    Constitutional: No weight loss, fever, chills, weakness or fatigue HEENT: Eyes: No change in vision               Ears, Nose, Throat:  No change in hearing or congestion Skin: No rash or itching Cardiovascular: No chest pain, chest pressure or palpitations   Respiratory: No SOB or cough Gastrointestinal: See HPI and otherwise negative Genitourinary: No dysuria or change in urinary frequency Neurological: No headache, dizziness or syncope Musculoskeletal: No new muscle or joint  pain Hematologic: No bleeding or bruising Psychiatric: No history of depression or anxiety     Physical Exam:  Vital signs in last 24 hours: Temp:  [97 F (36.1 C)-97.7 F (36.5 C)] 97 F (36.1 C) (01/30 0913) Pulse Rate:  [82-100] 97 (01/30 0913) Resp:  [13-20] 20 (01/30 0913) BP: (143-155)/(94-111) 145/94 (01/30 0913) SpO2:  [100 %] 100 % (01/30 0913) Weight:  [72.6 kg] 72.6 kg (01/30 0538)    General:   Pleasant, thin appearing AA male in no acute distress Head:  Normocephalic and atraumatic. Eyes: sclerae anicteric,conjunctive pink  Heart:  regular rate and rhythm, no murmurs or gallops Pulm: Clear anteriorly; no wheezing Abdomen:  Soft, Flat AB, Sluggish bowel sounds. moderate tenderness in the epigastrium and in the RUQ. With guarding and Without rebound, without hepatomegaly. Extremities:  Without edema. Msk:  Symmetrical without gross deformities. Peripheral pulses intact.  Neurologic:  Alert and  oriented x4;  grossly normal neurologically. Skin:   Dry and intact without significant lesions or rashes. Psychiatric: Demonstrates good judgement and reason without abnormal affect or behaviors.  LAB RESULTS: Recent Labs    12/07/21 0549  WBC 5.1  HGB 13.0  HCT 37.5*  PLT 400   BMET Recent Labs    12/07/21 0549  NA 130*  K 3.2*  CL 94*  CO2 24  GLUCOSE 231*  BUN 6  CREATININE 0.81  CALCIUM 9.0   LFT Recent Labs    12/07/21 0549  PROT 6.9  ALBUMIN 3.5  AST 155*  ALT 298*  ALKPHOS 907*  BILITOT 1.3*   PT/INR No results for input(s): LABPROT, INR in the last 72 hours.  STUDIES: CT ABDOMEN PELVIS W CONTRAST  Result Date: 12/07/2021 CLINICAL DATA:  Epigastric abdominal pain. EXAM: CT ABDOMEN AND PELVIS WITH CONTRAST TECHNIQUE: Multidetector CT imaging of the abdomen and pelvis was performed using the standard protocol following bolus administration of intravenous contrast. RADIATION DOSE REDUCTION: This exam was performed according to the  departmental dose-optimization program which includes automated exposure control, adjustment of the mA and/or kV according to patient size and/or use of iterative reconstruction technique. CONTRAST:  6m OMNIPAQUE IOHEXOL 300 MG/ML  SOLN COMPARISON:  10/07/2021 FINDINGS: Lower chest: Unremarkable. Hepatobiliary: Mildly dilated gallbladder with no visible gallstones and no gallbladder wall thickening. Small amount of pericholecystic fluid. Unremarkable liver. An oval area of fat deposition is noted in the liver adjacent to the falciform ligament. Pancreas: Interval resolution and marked decrease in size of previously demonstrated large pancreatic and peripancreatic fluid collections. There is a residual collection in the distal pancreatic tail with mild thin internal septation measuring 3.7 x 2.4 cm on image number 45/3. This previously measured 9.6 x 8.3 cm. Multiple small pancreatic calcifications are again demonstrated. Diffuse peripancreatic edema. Spleen: Normal in size without focal abnormality. Adrenals/Urinary Tract: Adrenal glands are unremarkable. Kidneys are normal, without renal calculi, focal lesion, or hydronephrosis. Bladder is unremarkable. Stomach/Bowel: Unremarkable stomach, small bowel and colon. The appendix is not well visualized. No findings suspicious for appendicitis. Vascular/Lymphatic: No significant vascular findings are present. No enlarged abdominal or pelvic lymph nodes. Normal opacification of the splenic vein is currently demonstrated. Reproductive: Prostate is unremarkable. Other: No abdominal wall hernia or abnormality. No abdominopelvic ascites. Musculoskeletal: Unremarkable bones. IMPRESSION: 1. Interval changes of acute pancreatitis. 2. Interval significant decrease in size of a pancreatic tail pseudocyst with resolution of other previously demonstrated pseudocysts. 3. Stable changes of chronic pancreatitis. 4. Gallbladder distension and mild pericholecystic fluid, most likely  due to the adjacent changes of acute pancreatitis. Electronically Signed   By: SClaudie ReveringM.D.   On: 12/07/2021 08:56     AVladimir Crofts 12/07/2021, 11:36 AM

## 2021-12-07 NOTE — ED Triage Notes (Signed)
Pt presents to the ED from home with complaints of having a stent placed in his pancreas on Wednesday, they were unable to place the stent but the scope went in and I have had epigastric pain and SOB. Pt also having N/V, denies diarrhea.

## 2021-12-07 NOTE — ED Provider Notes (Addendum)
[facility] EMERGENCY DEPARTMENT Provider Note   CSN: [id number] Arrival date & time: 12/07/21  0533     History  Chief Complaint  Patient presents with   Abdominal Pain   Shortness of Breath    Carlos [name] is a 36 y.o. male with a past medical history of pancreatic pseudocyst, alcoholic pancreatitis, who presents to the ED complaining of epigastric abdominal pain onset 6 days.  Patient notes that he had an endoscopy completed on 12/02/2021 where he was to have a stent placed however it was not placed at the time.  He notes that he called the GI office 5 days ago that was after his endoscopy and was told that they would reach back out to him today. Patient has associated nonbloody emesis (last episode at 3 AM).  Has tried ibuprofen and tramadol without relief of his symptoms.  Denies chest pain, shortness of breath, fever, chills, nausea, vomiting, dysuria, hematuria, constipation, diarrhea.  He denies excessive NSAID use or alcohol use.  He notes that his last alcoholic beverage was in November 2022.   Per patient chart review: Patient had endoscopy completed on 12/02/2021 by Dr. Rush Landmark.  Pathology of stomach biopsy noted reactive gastropathy with focal surface erosion and features of healing mucosal injury.  Negative for H. pylori, intestinal metaplasia, dysplasia, malignancy.  Pathology of esophagus biopsy noted esophagitis with fungal organisms consistent with Candida.  Negative for intestinal metaplasia, dysplasia, malignancy.  The history is provided by the patient. No language interpreter was used.      Home Medications Prior to Admission medications   Medication Sig Start Date End Date Taking? Authorizing Provider  fluconazole (DIFLUCAN) 200 MG tablet Take 2 tablets (400 mg total) by mouth daily for 1 day, THEN 1 tablet (200 mg total) daily for 13 days. 12/03/21 12/17/21 Yes [name], Telford Nab., MD  insulin aspart (NOVOLOG) 100 UNIT/ML FlexPen Inject 4 Units  into the skin 3 (three) times daily with meals. 09/16/21  Yes Alma Friendly, MD  insulin glargine (LANTUS) 100 UNIT/ML Solostar Pen Inject 12 Units into the skin daily. 09/16/21  Yes Alma Friendly, MD  lipase/protease/amylase (CREON) 36000 UNITS CPEP capsule Take 2 capsules (72,000 Units total) by mouth 3 (three) times daily with meals. May also take 1 capsule (36,000 Units total) as needed (with snacks). Patient taking differently: Take 2 capsules (72000 units) by mouth daily with breakfast and supper, take 1 capsule (36000 units) with lunch/midday snack 08/31/21  Yes [name], Telford Nab., MD  ondansetron (ZOFRAN) 8 MG tablet Take 1 tablet (8 mg total) by mouth every 8 (eight) hours as needed for nausea or vomiting. 12/02/21  Yes [name], Telford Nab., MD  sucralfate (CARAFATE) 1 GM/10ML suspension Take 10 mLs (1 g total) by mouth 2 (two) times daily. 12/02/21 02/03/22 Yes [name], Telford Nab., MD  blood glucose meter kit and supplies KIT Dispense based on patient and insurance preference. Use up to four times daily as directed. 09/16/21   Alma Friendly, MD  Insulin Pen Needle (PEN NEEDLES 3/16") 31G X 5 MM MISC Use as directed with insulin pen 09/16/21   Alma Friendly, MD  oxyCODONE 10 MG TABS Take 0.5-1 tablets (5-10 mg total) by mouth every 6 (six) hours as needed for up to 5 days for severe pain. Patient not taking: Reported on 12/07/2021 12/02/21 12/07/21  [name], Telford Nab., MD      Allergies    Other    Review of Systems   Review  of Systems  Constitutional:  Negative for chills and fever.  Respiratory:  Positive for shortness of breath.   Cardiovascular:  Negative for chest pain.  Gastrointestinal:  Positive for abdominal pain. Negative for constipation, diarrhea, nausea and vomiting.  Genitourinary:  Negative for dysuria and hematuria.  Skin:  Negative for rash.  All other systems reviewed and are negative.  Physical Exam Updated Vital Signs BP (!)  143/99    Pulse 82    Temp 97.7 F (36.5 C) (Oral)    Resp 16    Ht '6\' 5"'  (1.956 m)    Wt 72.6 kg    SpO2 100%    BMI 18.97 kg/m  Physical Exam Vitals and nursing note reviewed.  Constitutional:      General: He is not in acute distress.    Appearance: He is not diaphoretic.  HENT:     Head: Normocephalic and atraumatic.     Mouth/Throat:     Pharynx: No oropharyngeal exudate.  Eyes:     General: No scleral icterus.    Conjunctiva/sclera: Conjunctivae normal.  Cardiovascular:     Rate and Rhythm: Regular rhythm. Tachycardia present.     Pulses: Normal pulses.     Heart sounds: Normal heart sounds.  Pulmonary:     Effort: Pulmonary effort is normal. No respiratory distress.     Breath sounds: Normal breath sounds. No wheezing.  Abdominal:     General: Bowel sounds are normal.     Palpations: Abdomen is soft. There is no mass.     Tenderness: There is abdominal tenderness in the epigastric area. There is no guarding or rebound.     Comments: Epigastric tenderness to palpation.  Musculoskeletal:        General: Normal range of motion.     Cervical back: Normal range of motion and neck supple.  Skin:    General: Skin is warm and dry.  Neurological:     Mental Status: He is alert.  Psychiatric:        Behavior: Behavior normal.    ED Results / Procedures / Treatments   Labs (all labs ordered are listed, but only abnormal results are displayed) Labs Reviewed  COMPREHENSIVE METABOLIC PANEL - Abnormal; Notable for the following components:      Result Value   Sodium 130 (*)    Potassium 3.2 (*)    Chloride 94 (*)    Glucose, Bld 231 (*)    AST 155 (*)    ALT 298 (*)    Alkaline Phosphatase 907 (*)    Total Bilirubin 1.3 (*)    All other components within normal limits  CBC - Abnormal; Notable for the following components:   RBC 3.54 (*)    HCT 37.5 (*)    MCV 105.9 (*)    MCH 36.7 (*)    All other components within normal limits  URINALYSIS, ROUTINE W REFLEX  MICROSCOPIC - Abnormal; Notable for the following components:   Glucose, UA >=500 (*)    All other components within normal limits  MAGNESIUM - Abnormal; Notable for the following components:   Magnesium 1.6 (*)    All other components within normal limits  URINALYSIS, MICROSCOPIC (REFLEX) - Abnormal; Notable for the following components:   Bacteria, UA RARE (*)    All other components within normal limits  LIPASE, BLOOD  HIV ANTIBODY (ROUTINE TESTING W REFLEX)    EKG None  Radiology CT ABDOMEN PELVIS W CONTRAST  Result Date: 12/07/2021 CLINICAL DATA:  Epigastric abdominal pain. EXAM: CT ABDOMEN AND PELVIS WITH CONTRAST TECHNIQUE: Multidetector CT imaging of the abdomen and pelvis was performed using the standard protocol following bolus administration of intravenous contrast. RADIATION DOSE REDUCTION: This exam was performed according to the departmental dose-optimization program which includes automated exposure control, adjustment of the mA and/or kV according to patient size and/or use of iterative reconstruction technique. CONTRAST:  65m OMNIPAQUE IOHEXOL 300 MG/ML  SOLN COMPARISON:  10/07/2021 FINDINGS: Lower chest: Unremarkable. Hepatobiliary: Mildly dilated gallbladder with no visible gallstones and no gallbladder wall thickening. Small amount of pericholecystic fluid. Unremarkable liver. An oval area of fat deposition is noted in the liver adjacent to the falciform ligament. Pancreas: Interval resolution and marked decrease in size of previously demonstrated large pancreatic and peripancreatic fluid collections. There is a residual collection in the distal pancreatic tail with mild thin internal septation measuring 3.7 x 2.4 cm on image number 45/3. This previously measured 9.6 x 8.3 cm. Multiple small pancreatic calcifications are again demonstrated. Diffuse peripancreatic edema. Spleen: Normal in size without focal abnormality. Adrenals/Urinary Tract: Adrenal glands are unremarkable.  Kidneys are normal, without renal calculi, focal lesion, or hydronephrosis. Bladder is unremarkable. Stomach/Bowel: Unremarkable stomach, small bowel and colon. The appendix is not well visualized. No findings suspicious for appendicitis. Vascular/Lymphatic: No significant vascular findings are present. No enlarged abdominal or pelvic lymph nodes. Normal opacification of the splenic vein is currently demonstrated. Reproductive: Prostate is unremarkable. Other: No abdominal wall hernia or abnormality. No abdominopelvic ascites. Musculoskeletal: Unremarkable bones. IMPRESSION: 1. Interval changes of acute pancreatitis. 2. Interval significant decrease in size of a pancreatic tail pseudocyst with resolution of other previously demonstrated pseudocysts. 3. Stable changes of chronic pancreatitis. 4. Gallbladder distension and mild pericholecystic fluid, most likely due to the adjacent changes of acute pancreatitis. Electronically Signed   By: SClaudie ReveringM.D.   On: 12/07/2021 08:56   [place] Limited RUQ (LIVER/GB)  Result Date: 12/07/2021 CLINICAL DATA:  Right upper quadrant pain for 1 year. EXAM: ULTRASOUND ABDOMEN LIMITED RIGHT UPPER QUADRANT COMPARISON:  CT abdomen and pelvis 12/07/2021. Right upper quadrant ultrasound 09/26/2020. FINDINGS: Gallbladder: Moderately distended gallbladder containing a small amount of sludge. Possible 4 mm polyp or sludge ball for which no follow-up imaging is recommended. No shadowing gallstones or gallbladder wall thickening. No sonographic Murphy sign noted by sonographer. Common bile duct: Diameter: 6 mm Liver: No focal lesion identified. Within normal limits in parenchymal echogenicity. Portal vein is patent on color Doppler imaging with normal direction of blood flow towards the liver. Other: None. IMPRESSION: 1. Distended gallbladder containing sludge. No gallstones or evidence of cholecystitis. 2. No biliary dilatation. Electronically Signed   By: ALogan BoresM.D.   On:  12/07/2021 13:40    Procedures Procedures    Medications Ordered in ED Medications  enoxaparin (LOVENOX) injection 40 mg (has no administration in time range)  potassium chloride 10 mEq in 100 mL IVPB (has no administration in time range)  0.9 %  sodium chloride infusion (has no administration in time range)  acetaminophen (TYLENOL) tablet 650 mg (has no administration in time range)    Or  acetaminophen (TYLENOL) suppository 650 mg (has no administration in time range)  ondansetron (ZOFRAN) tablet 4 mg (has no administration in time range)    Or  ondansetron (ZOFRAN) injection 4 mg (has no administration in time range)  albuterol (PROVENTIL) (2.5 MG/3ML) 0.083% nebulizer solution 2.5 mg (has no administration in time range)  pantoprazole (PROTONIX) injection 40 mg (has  no administration in time range)  sucralfate (CARAFATE) 1 GM/10ML suspension 1 g (has no administration in time range)  fluconazole (DIFLUCAN) tablet 400 mg (has no administration in time range)  fluconazole (DIFLUCAN) tablet 200 mg (has no administration in time range)  lipase/protease/amylase (CREON) capsule 72,000 Units (has no administration in time range)  morphine 2 MG/ML injection 2 mg (has no administration in time range)  sodium chloride 0.9 % bolus 1,000 mL (0 mLs Intravenous Stopped 12/07/21 1032)  famotidine (PEPCID) IVPB 20 mg premix (0 mg Intravenous Stopped 12/07/21 1239)  morphine 4 MG/ML injection 4 mg (4 mg Intravenous Given 12/07/21 0858)  alum & mag hydroxide-simeth (MAALOX/MYLANTA) 200-200-20 MG/5ML suspension 30 mL (30 mLs Oral Given 12/07/21 0858)    And  lidocaine (XYLOCAINE) 2 % viscous mouth solution 15 mL (15 mLs Oral Given 12/07/21 0858)  iohexol (OMNIPAQUE) 300 MG/ML solution 80 mL (80 mLs Intravenous Contrast Given 12/07/21 0836)    ED Course/ Medical Decision Making/ A&P Clinical Course as of 12/07/21 1409  Mon Dec 07, 2021  0913 Patient reevaluated and noted improvement of symptoms with  treatment regimen in the ED.  Notified of lab and imaging findings. [SB]  1113 Secure chat sent to GI PA-C, Azucena Freed and Vicie Mutters who noted RUQ [place] [SB]  1211 Consult with GI specialist, Dr. Lorenso Courier who recommends RUQ and admission. [SB]  1213 Consult with Hospitalist, Dr. Tamala Julian who agrees with admission at this time. [SB]  1250 Patient notified of admission and agreeable at this time. [SB]    Clinical Course User Index [SB] Sophiana Milanese A, PA-C                           Medical Decision Making Amount and/or Complexity of Data Reviewed Labs: ordered. Radiology: ordered.  Risk OTC drugs. Prescription drug management. Decision regarding hospitalization.   Patient presents to the emergency department with epigastric abdominal pain x 6 days.  Patient with recent endoscopy on 12/02/2021 where attempted stent was placed to the pancreas.  Biopsy of the area noted fungal esophagitis (Candida) and negative for H. pylori, intestinal metaplasia, dysplasia, malignancy.  Patient with a history of alcoholic pancreatitis, pancreatic pseudocyst and followed by low-power GI with Dr. Rush Landmark.  Per patient he has not consumed alcohol since November 2022.  Patient with a history of alcoholic pancreatitis and pancreatic pseudocyst.  Denies excessive NSAID use.  Vital signs stable, patient afebrile, not tachycardic.  On exam patient with epigastric tenderness to palpation.  Remainder of exam without acute findings.  Differential diagnosis includes pancreatitis, UTI, GERD, cholecystitis, perforation.   Additional history obtained:  External records from outside source obtained and reviewed including: Patient had endoscopy completed on 12/02/2021 by Dr. Rush Landmark.  Pathology of stomach biopsy noted reactive gastropathy with focal surface erosion and features of healing mucosal injury.  Negative for H. pylori, intestinal metaplasia, dysplasia, malignancy.  Pathology of esophagus biopsy noted esophagitis  with fungal organisms consistent with Candida.  Negative for intestinal metaplasia, dysplasia, malignancy.  EKG: Sinus tachycardia without acute ST/T changes.  Labs:  I ordered, and personally interpreted labs.  The pertinent results include:   CBC without leukocytosis. Lipase unremarkable at 35. CMP with sodium at 130, potassium at 3.2, glucose elevated to 231, elevated LFTs, AST elevated at 155, ALT 298, alk phos at 907.  Total bili elevated 1.3. Urinalysis notable for elevated glucose greater than 500, otherwise no acute signs of infection.   Imaging: I  ordered imaging studies including CT abdomen pelvis with contrast I independently visualized and interpreted imaging which showed:  CT abdomen pelvis: 1. Interval changes of acute pancreatitis.  2. Interval significant decrease in size of a pancreatic tail  pseudocyst with resolution of other previously demonstrated  pseudocysts.  3. Stable changes of chronic pancreatitis.  4. Gallbladder distension and mild pericholecystic fluid, most  likely due to the adjacent changes of acute pancreatitis.   Right upper quadrant ultrasound:  1. Distended gallbladder containing sludge. No gallstones or  evidence of cholecystitis.  2. No biliary dilatation.    I agree with the radiologist interpretation  Medications:  I ordered medication including IV fluids, GI cocktail, morphine for pain management.  Reevaluation of the patient after these medicines showed that the patient improved I have reviewed the patients home medicines and have made adjustments as needed  Reevaluation: After the interventions noted above, I reevaluated the patient and found that they have :improved.   Consultations: I requested consultation with the gastroenterologist, Dr. Lorenso Courier who recommends right upper quadrant ultrasound and admission. I requested consultation with the hospitalist, Dr. Tamala Julian and discussed lab and imaging findings as well as pertinent plan -  they recommend: Admission at this time.   Disposition: Patient presentation suspicious for epigastric abdominal pain in the setting of elevated liver enzymes and gallbladder distention.  Doubt cholecystitis at this time, no findings on right upper quadrant ultrasound.  Doubt pancreatitis, lipase unremarkable and no acute imaging findings.  Doubt UTI, urinalysis without infection.  Doubt perforation, no acute findings on CT abdomen pelvis.  89-year-old . After consideration of the diagnostic results and the patients response to treatment, I feel that the patent would benefit from admission to the hospital due to hyponatremia, hypokalemia, elevated liver enzymes and epigastric pain status post endoscopy.  Discussed with patient at bedside who is agreeable with admission at this time.  Patient appears safe for admission.   This chart was dictated using voice recognition software, Dragon. Despite the best efforts of this provider to proofread and correct errors, errors may still occur which can change documentation meaning.  Late addendum to the chart: Further review of the CT scan ordered by myself on 12/07/21, noticed that interval changes of acute pancreatitis was noted on the radiologist impression. These findings are consistent with patients presentation to the ED.   Final Clinical Impression(s) / ED Diagnoses Final diagnoses:  Epigastric abdominal pain  RUQ pain    Rx / DC Orders ED Discharge Orders     None         Hughes Wyndham A, PA-C 12/07/21 1409    Pattricia Boss, MD 12/09/21 [address], Carlos [name] A, PA-C 12/09/21 1410    Pattricia Boss, MD 12/10/21 1013

## 2021-12-07 NOTE — H&P (Addendum)
History and Physical    Paxon Propes TIW:[id number] DOB: 1986-10-01 DOA: 12/07/2021  Referring MD/NP/PA: Steva Colder, PA-C PCP: [name], MD  Patient coming from: Home  Chief Complaint: Abdominal pain  I have personally briefly reviewed patient's old medical records in [place]   HPI: Carlos [name] is a 36 y.o. male with medical history significant of DM type I, alcoholic pancreatitis with complications of pseudocyst, and splenic vein thrombosis who presents with complaints of worsening epigastric abdominal pain.  He was first diagnosed with pancreatitis back in 09/2020 that was thought related to alcohol, but since that time patient reports that he is not drank any alcohol.  He had been admitted into the hospital in 09/2021 after having syncopal episode with new onset DKA and recurrence of pancreatitis.  His started on insulin at that time, but reports he is continue to have abdominal pain.  He had  undergone EGD/EUS for pancreatic pseudocyst on 12/02/2021 by Dr. Rush Landmark which showed grade D erosive esophagitis with Candida present, H. pylori negative gastritis, and hiatal hernia.  No stenting was done during the procedure.  He was prescribed medications and so to pick up everything except for the oxycodone.  He reports taking the medications as prescribed, but noted worsening abdominal pain that he currently rates 8 out of 10 on the pain scale.  Associated symptoms include nausea and vomiting.  He was able to keep some food and liquids down intermittently.  Denies any in fever, chest pain, shortness of breath, or dysuria symptoms.  Patient does admit to smoking Black and mild daily on average.  ED Course: Upon admission into the emergency department patient was noted to be afebrile, blood pressure 138/104-160/113, and all other vital signs maintained.  Labs significant for elevated MCV/MCH, sodium 130, potassium 3.2, chloride 94, glucose 231, anion gap 12, alkaline phosphatase 907,  lipase 35, AST 155, ALT 298, and total bilirubin 1.3. CT scan of the abdomen pelvis noted interval changes acute pancreatitis with interval decrease in size of pancreatic tail pseudocyst, resolution of other previously demonstrated pseudocysts, gallbladder distention, and mild pericholecystic fluid thought to be related to changes of acute pancreatitis.  [facility] have been formally consulted.  Patient having given 1 L normal saline IV fluids, morphine, Pepcid, and GI cocktail.  TRH called to admit  Review of Systems  Constitutional:  Negative for fever.  HENT:  Negative for ear discharge and nosebleeds.   Eyes:  Negative for photophobia and pain.  Respiratory:  Negative for cough and shortness of breath.   Cardiovascular:  Negative for chest pain and leg swelling.  Gastrointestinal:  Positive for abdominal pain, nausea and vomiting.  Genitourinary:  Negative for hematuria.  Neurological:  Negative for focal weakness and loss of consciousness.   Past Medical History:  Diagnosis Date   Anxiety    Diabetes ([place])    Pancreatitis    Splenic vein thrombosis     Past Surgical History:  Procedure Laterality Date   BIOPSY  12/02/2021   Procedure: BIOPSY;  Surgeon: Rush Landmark Telford Nab., MD;  Location: [facility];  Service: Gastroenterology;;   ESOPHAGOGASTRODUODENOSCOPY (EGD) WITH PROPOFOL N/A 12/02/2021   Procedure: ESOPHAGOGASTRODUODENOSCOPY (EGD) WITH PROPOFOL;  Surgeon: Irving Copas., MD;  Location: [facility];  Service: Gastroenterology;  Laterality: N/A;   NO PAST SURGERIES     UPPER ESOPHAGEAL ENDOSCOPIC ULTRASOUND (EUS) N/A 12/02/2021   Procedure: UPPER ESOPHAGEAL ENDOSCOPIC ULTRASOUND (EUS);  Surgeon: Rush Landmark Telford Nab., MD;  Location: [facility];  Service:  Gastroenterology;  Laterality: N/A;     reports that he has been smoking cigars. He started smoking about 19 years ago. He has never used smokeless tobacco. He reports that he does not currently use alcohol. He  reports that he does not use drugs.  Allergies  Allergen Reactions   Other Anaphylaxis    [name] syrup    Family History  Problem Relation Age of Onset   Hypertension Mother    Alcoholism Maternal Uncle    Diabetes Maternal Grandmother    Diabetes Maternal Grandfather    Diabetes Paternal Grandmother    Diabetes Paternal Grandfather    Colon cancer Neg Hx    Esophageal cancer Neg Hx    Inflammatory bowel disease Neg Hx    Liver disease Neg Hx    Pancreatic cancer Neg Hx    Rectal cancer Neg Hx    Stomach cancer Neg Hx     Prior to Admission medications   Medication Sig Start Date End Date Taking? Authorizing Provider  blood glucose meter kit and supplies KIT Dispense based on patient and insurance preference. Use up to four times daily as directed. 09/16/21   Alma Friendly, MD  fluconazole (DIFLUCAN) 200 MG tablet Take 2 tablets (400 mg total) by mouth daily for 1 day, THEN 1 tablet (200 mg total) daily for 13 days. 12/03/21 12/17/21  [name], Telford Nab., MD  ibuprofen (ADVIL) 200 MG tablet Take 400-600 mg by mouth every 6 (six) hours as needed for mild pain.    [provider]  insulin aspart (NOVOLOG) 100 UNIT/ML FlexPen Inject 4 Units into the skin 3 (three) times daily with meals. 09/16/21   Alma Friendly, MD  insulin glargine (LANTUS) 100 UNIT/ML Solostar Pen Inject 12 Units into the skin daily. 09/16/21   Alma Friendly, MD  Insulin Pen Needle (PEN NEEDLES 3/16") 31G X 5 MM MISC Use as directed with insulin pen 09/16/21   Alma Friendly, MD  lipase/protease/amylase (CREON) 36000 UNITS CPEP capsule Take 2 capsules (72,000 Units total) by mouth 3 (three) times daily with meals. May also take 1 capsule (36,000 Units total) as needed (with snacks). Patient taking differently: Take 2 capsules (72000 units) by mouth daily with breakfast and supper, take 1 capsule (36000 units) with lunch/midday snack 08/31/21   [name], Telford Nab., MD   lisinopril (ZESTRIL) 5 MG tablet Take 1 tablet (5 mg total) by mouth daily. 09/28/21   [name], MD  Multiple Vitamins-Minerals (CENTRUM) tablet Take 1 tablet by mouth every morning.    [provider]  omeprazole (PRILOSEC) 40 MG capsule Take 1 capsule (40 mg total) by mouth 2 (two) times daily before a meal. 12/02/21   [name], Telford Nab., MD  ondansetron (ZOFRAN) 8 MG tablet Take 1 tablet (8 mg total) by mouth every 8 (eight) hours as needed for nausea or vomiting. 12/02/21   [name], Telford Nab., MD  oxyCODONE 10 MG TABS Take 0.5-1 tablets (5-10 mg total) by mouth every 6 (six) hours as needed for up to 5 days for severe pain. 12/02/21 12/07/21  [name], Telford Nab., MD  sucralfate (CARAFATE) 1 GM/10ML suspension Take 10 mLs (1 g total) by mouth 2 (two) times daily. 12/02/21 02/03/22  [name], Telford Nab., MD    Physical Exam:  Constitutional: Thin middle-age male who appears to be in discomfort Vitals:   12/07/21 0913 12/07/21 1000 12/07/21 1030 12/07/21 1230  BP: (!) 145/94 (!) 150/113 (!) 138/104 (!) 160/113  Pulse: 97  88 93 87  Resp: '20 14 14 ' (!) 9  Temp: (!) 97 F (36.1 C)     TempSrc: Oral     SpO2: 100% 100% 100% 100%  Weight:      Height:       Eyes: PERRL, lids and conjunctivae normal ENMT: Mucous membranes are moist. Posterior pharynx clear of any exudate or lesions. Normal dentition.  Neck: normal, supple, no masses, no thyromegaly Respiratory: clear to auscultation bilaterally, no wheezing, no crackles. Normal respiratory effort. No accessory muscle use.  Cardiovascular: Regular rate and rhythm, no murmurs / rubs / gallops. No extremity edema. 2+ pedal pulses. No carotid bruits.  Abdomen: Tenderness to palpation epigastrically and in of the right upper quadrant with some guarding but no rebound tenderness appreciated.  Bowel sounds are present, but decreased. Musculoskeletal: no clubbing / cyanosis. No joint deformity upper and lower  extremities.  Normal muscle tone.  Skin: no rashes, lesions, ulcers. No induration Neurologic: CN 2-12 grossly intact. Strength 5/5 in all 4.  Psychiatric: Normal judgment and insight. Alert and oriented x 3. Normal mood.     Labs on Admission: I have personally reviewed following labs and imaging studies  CBC: Recent Labs  Lab 12/07/21 0549  WBC 5.1  HGB 13.0  HCT 37.5*  MCV 105.9*  PLT 209   Basic Metabolic Panel: Recent Labs  Lab 12/07/21 0549  NA 130*  K 3.2*  CL 94*  CO2 24  GLUCOSE 231*  BUN 6  CREATININE 0.81  CALCIUM 9.0   GFR: Estimated Creatinine Clearance: 129.5 mL/min (by C-G formula based on SCr of 0.81 mg/dL). Liver Function Tests: Recent Labs  Lab 12/07/21 0549  AST 155*  ALT 298*  ALKPHOS 907*  BILITOT 1.3*  PROT 6.9  ALBUMIN 3.5   Recent Labs  Lab 12/07/21 0549  LIPASE 35   No results for input(s): AMMONIA in the last 168 hours. Coagulation Profile: No results for input(s): INR, PROTIME in the last 168 hours. Cardiac Enzymes: No results for input(s): CKTOTAL, CKMB, CKMBINDEX, TROPONINI in the last 168 hours. BNP (last 3 results) No results for input(s): PROBNP in the last 8760 hours. HbA1C: No results for input(s): HGBA1C in the last 72 hours. CBG: Recent Labs  Lab 12/02/21 1009  GLUCAP 117*   Lipid Profile: No results for input(s): CHOL, HDL, LDLCALC, TRIG, CHOLHDL, LDLDIRECT in the last 72 hours. Thyroid Function Tests: No results for input(s): TSH, T4TOTAL, FREET4, T3FREE, THYROIDAB in the last 72 hours. Anemia Panel: No results for input(s): VITAMINB12, FOLATE, FERRITIN, TIBC, IRON, RETICCTPCT in the last 72 hours. Urine analysis:    Component Value Date/Time   COLORURINE STRAW (A) 09/13/2021 1401   APPEARANCEUR CLEAR 09/13/2021 1401   LABSPEC 1.028 09/13/2021 1401   PHURINE 5.0 09/13/2021 1401   GLUCOSEU >=500 (A) 09/13/2021 1401   HGBUR NEGATIVE 09/13/2021 1401   BILIRUBINUR NEGATIVE 09/13/2021 1401   KETONESUR 80  (A) 09/13/2021 1401   PROTEINUR NEGATIVE 09/13/2021 1401   NITRITE NEGATIVE 09/13/2021 1401   LEUKOCYTESUR NEGATIVE 09/13/2021 1401   Sepsis Labs: No results found for this or any previous visit (from the past 240 hour(s)).   Radiological Exams on Admission: CT ABDOMEN PELVIS W CONTRAST  Result Date: 12/07/2021 CLINICAL DATA:  Epigastric abdominal pain. EXAM: CT ABDOMEN AND PELVIS WITH CONTRAST TECHNIQUE: Multidetector CT imaging of the abdomen and pelvis was performed using the standard protocol following bolus administration of intravenous contrast. RADIATION DOSE REDUCTION: This exam was performed according to the  departmental dose-optimization program which includes automated exposure control, adjustment of the mA and/or kV according to patient size and/or use of iterative reconstruction technique. CONTRAST:  78m OMNIPAQUE IOHEXOL 300 MG/ML  SOLN COMPARISON:  10/07/2021 FINDINGS: Lower chest: Unremarkable. Hepatobiliary: Mildly dilated gallbladder with no visible gallstones and no gallbladder wall thickening. Small amount of pericholecystic fluid. Unremarkable liver. An oval area of fat deposition is noted in the liver adjacent to the falciform ligament. Pancreas: Interval resolution and marked decrease in size of previously demonstrated large pancreatic and peripancreatic fluid collections. There is a residual collection in the distal pancreatic tail with mild thin internal septation measuring 3.7 x 2.4 cm on image number 45/3. This previously measured 9.6 x 8.3 cm. Multiple small pancreatic calcifications are again demonstrated. Diffuse peripancreatic edema. Spleen: Normal in size without focal abnormality. Adrenals/Urinary Tract: Adrenal glands are unremarkable. Kidneys are normal, without renal calculi, focal lesion, or hydronephrosis. Bladder is unremarkable. Stomach/Bowel: Unremarkable stomach, small bowel and colon. The appendix is not well visualized. No findings suspicious for appendicitis.  Vascular/Lymphatic: No significant vascular findings are present. No enlarged abdominal or pelvic lymph nodes. Normal opacification of the splenic vein is currently demonstrated. Reproductive: Prostate is unremarkable. Other: No abdominal wall hernia or abnormality. No abdominopelvic ascites. Musculoskeletal: Unremarkable bones. IMPRESSION: 1. Interval changes of acute pancreatitis. 2. Interval significant decrease in size of a pancreatic tail pseudocyst with resolution of other previously demonstrated pseudocysts. 3. Stable changes of chronic pancreatitis. 4. Gallbladder distension and mild pericholecystic fluid, most likely due to the adjacent changes of acute pancreatitis. Electronically Signed   By: SClaudie ReveringM.D.   On: 12/07/2021 08:56    EKG: Independently reviewed.  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  Patient presents with progressively worsening epigastric abdominal pain with nausea and vomiting since recent EGD on 1/25.  Lipase was within normal limits, but CT imaging noted acute pancreatitis with interval improvement in prior pseudocyst.  GI have been formally consulted and noted symptoms could be secondary to pancreatitis and/or esophagitis. -Admit to medical telemetry   -Clear liquid diet and advance as tolerated -Check triglyceride level -Normal saline IV fluids at 150 mL/h  -Continue Creon -Oxycodone/morphine IV as needed for pain -Antiemetics as needed nausea -Appreciate GI consultative services, we will follow-up for any further recommendations.   Gallbladder distention: Acute.  CT scan of the abdomen pelvis noted dilated gallbladder with no visible gallstones and a small amount of pericholecystic fluid present.  Abdominal ultrasound noted distended gallbladder with  sludge present and no signs of cholecystitis. -Continue to monitor         Hypokalemia: Acute.  Potassium noted to be 3.2 on admission. -Give  potassium chloride 30 mEq IV -Continue to monitor and replace as needed  Hyponatremia: Acute.  Sodium 130 on admission.  Suspect hypovolemic hyponatremia given.  Nausea and vomiting. -IV fluids as noted above -Recheck sodium levels in a.m.  Diabetes mellitus type 1, uncontrolled: On admission glucose elevated up to 231 without signs of elevated anion gap.  Last available hemoglobin A1c was 10.1 on 09/14/2021.  Home regimen includes Lantus 12 units daily and NovoLog 4 units 3 times daily with meals. -Hypoglycemic protocols -On insulin regimen continue patient's home insulin regimen -Adjust as needed  Transaminitis  hyperbilirubinemia: Initial labs revealed alkaline phosphatase 907, AST 155, ALT 298, and total bilirubin 1.3. -Repeat CMP in a.m.  Candidal esophagitis: Noted during recent EGD from 1/25.  Patient reports that he has been  taking the fluconazole as prescribed. -Continue fluconazole  Gastritis: Noted on recent EGD.  H. pylori testing was negative. 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  Splenic vein thrombosis: Patient was noted to have splenic vein thrombosis with collaterals during hospitalization in 09/2021, but recommended not to be on anticoagulations as symptoms were thought to be chronic in nature after consulting Dr.  Alvy Bimler of hematology oncology.  DVT prophylaxis: Lovenox Code Status: Full Family Communication: None requested Disposition Plan: Likely discharge home once medically stable Consults called: GI Admission status: Inpatient, require more than 2 midnight stay in the setting of need of aggressive IV fluid hydration  Norval Morton MD [facility]   If 7PM-7AM, please contact night-coverage   12/07/2021, 12:40 PM

## 2021-12-08 ENCOUNTER — Encounter (HOSPITAL_COMMUNITY): Payer: Self-pay | Admitting: Internal Medicine

## 2021-12-08 DIAGNOSIS — K863 Pseudocyst of pancreas: Secondary | ICD-10-CM

## 2021-12-08 DIAGNOSIS — R7401 Elevation of levels of liver transaminase levels: Secondary | ICD-10-CM

## 2021-12-08 DIAGNOSIS — R1011 Right upper quadrant pain: Secondary | ICD-10-CM

## 2021-12-08 DIAGNOSIS — B3781 Candidal esophagitis: Secondary | ICD-10-CM

## 2021-12-08 DIAGNOSIS — K859 Acute pancreatitis without necrosis or infection, unspecified: Principal | ICD-10-CM

## 2021-12-08 DIAGNOSIS — R1013 Epigastric pain: Secondary | ICD-10-CM

## 2021-12-08 LAB — COMPREHENSIVE METABOLIC PANEL
ALT: 176 U/L — ABNORMAL HIGH (ref 0–44)
AST: 93 U/L — ABNORMAL HIGH (ref 15–41)
Albumin: 3.1 g/dL — ABNORMAL LOW (ref 3.5–5.0)
Alkaline Phosphatase: 663 U/L — ABNORMAL HIGH (ref 38–126)
Anion gap: 10 (ref 5–15)
BUN: <5 mg/dL — ABNORMAL LOW (ref 6–20)
CO2: 24 mmol/L (ref 22–32)
Calcium: 8.9 mg/dL (ref 8.9–10.3)
Chloride: 102 mmol/L (ref 98–111)
Creatinine, Ser: 0.72 mg/dL (ref 0.61–1.24)
GFR, Estimated: >60 mL/min (ref >60)
Glucose, Bld: 87 mg/dL (ref 70–99)
Potassium: 3.9 mmol/L (ref 3.5–5.1)
Sodium: 136 mmol/L (ref 135–145)
Total Bilirubin: 1 mg/dL (ref 0.3–1.2)
Total Protein: 5.9 g/dL — ABNORMAL LOW (ref 6.5–8.1)

## 2021-12-08 LAB — GLUCOSE, CAPILLARY
Glucose-Capillary: 82 mg/dL (ref 70–99)
Glucose-Capillary: 133 mg/dL — ABNORMAL HIGH (ref 70–99)
Glucose-Capillary: 76 mg/dL (ref 70–99)
Glucose-Capillary: 136 mg/dL — ABNORMAL HIGH (ref 70–99)
Glucose-Capillary: 216 mg/dL — ABNORMAL HIGH (ref 70–99)

## 2021-12-08 LAB — CBC
HCT: 31.2 % — ABNORMAL LOW (ref 39.0–52.0)
Hemoglobin: 10.9 g/dL — ABNORMAL LOW (ref 13.0–17.0)
MCH: 36.2 pg — ABNORMAL HIGH (ref 26.0–34.0)
MCHC: 34.9 g/dL (ref 30.0–36.0)
MCV: 103.7 fL — ABNORMAL HIGH (ref 80.0–100.0)
Platelets: 336 10*3/uL (ref 150–400)
RBC: 3.01 MIL/uL — ABNORMAL LOW (ref 4.22–5.81)
RDW: 12.2 % (ref 11.5–15.5)
WBC: 7.1 10*3/uL (ref 4.0–10.5)
nRBC: 0 % (ref 0.0–0.2)

## 2021-12-08 LAB — HIV ANTIBODY (ROUTINE TESTING W REFLEX): HIV Screen 4th Generation wRfx: NONREACTIVE

## 2021-12-08 LAB — TRIGLYCERIDES: Triglycerides: 35 mg/dL (ref <150)

## 2021-12-08 MED ORDER — ADULT MULTIVITAMIN W/MINERALS CH
1.0000 | ORAL_TABLET | Freq: Every day | ORAL | Status: DC
  Administered 2021-12-08 – 2021-12-10 (×3): 1 via ORAL
  Filled 2021-12-08 (×3): qty 1

## 2021-12-08 MED ORDER — BOOST / RESOURCE BREEZE PO LIQD CUSTOM
1.0000 | Freq: Three times a day (TID) | ORAL | Status: DC
  Administered 2021-12-08 – 2021-12-10 (×3): 1 via ORAL

## 2021-12-08 MED ORDER — TRAZODONE HCL 50 MG PO TABS
50.0000 mg | ORAL_TABLET | Freq: Every evening | ORAL | Status: DC | PRN
  Administered 2021-12-08 – 2021-12-09 (×3): 50 mg via ORAL
  Filled 2021-12-08 (×3): qty 1

## 2021-12-08 MED ORDER — PROSOURCE PLUS PO LIQD
30.0000 mL | Freq: Two times a day (BID) | ORAL | Status: DC
  Administered 2021-12-10: 30 mL via ORAL
  Filled 2021-12-08 (×2): qty 30

## 2021-12-08 NOTE — Plan of Care (Signed)

## 2021-12-08 NOTE — Progress Notes (Signed)
Initial Nutrition Assessment  DOCUMENTATION CODES:   Severe malnutrition in context of chronic illness  INTERVENTION:  -Boost Breeze po TID, each supplement provides 250 kcal and 9 grams of protein -PROSource PLUS PO 28ms BID, each supplement provides 100 kcals and 15 grams of protein -MVI with minerals daily  NUTRITION DIAGNOSIS:   Severe Malnutrition related to chronic illness (chronic pancreatitis) as evidenced by severe muscle depletion, severe fat depletion.  GOAL:   Patient will meet greater than or equal to 90% of their needs  MONITOR:   PO intake, Supplement acceptance, Diet advancement, Weight trends, Labs, I & O's  REASON FOR ASSESSMENT:   Malnutrition Screening Tool    ASSESSMENT:   Pt with PMH significant for type 1 DM, alcoholic pancreatitis with complications of pseudocyst, and splenic vein thrombosis  admitted with acute on chronic pancreatitis . Also noted to have esophageal candidiasis.   1/25 - s/p EGD   Discussed pt with RN. Pt reports N/V sx have improved and endorses hunger. Pt currently on clear liquid diet. RD to order appropriate supplements. Supplement regimen may be adjusted once diet is advanced further. GI is following and planning for HIDA.   PO Intake: 0% x 2 recorded meals   UOP: [id number] hours I/O: +[id number] admit  Medications: SSI TID w/ meals, 4 units novolog TID w/ meals, 12 units semglee daily, creon, protonix, carafate Labs: Mg 1.6 (L), Alk Phos 663 (H), Elevated LFTs CBGs: [phone]  NUTRITION - FOCUSED PHYSICAL EXAM:  Flowsheet Row Most Recent Value  Orbital Region Mild depletion  Upper Arm Region Severe depletion  Thoracic and Lumbar Region Severe depletion  Buccal Region Moderate depletion  Temple Region Moderate depletion  Clavicle Bone Region Severe depletion  Clavicle and Acromion Bone Region Severe depletion  Scapular Bone Region Severe depletion  Dorsal Hand Severe depletion  Patellar Region Severe  depletion  Anterior Thigh Region Severe depletion  Posterior Calf Region Severe depletion  Edema (RD Assessment) None  Hair Reviewed  Eyes Reviewed  Mouth Reviewed  Skin Reviewed  Nails Reviewed       Diet Order:   Diet Order             Diet clear liquid Room service appropriate? Yes; Fluid consistency: Thin  Diet effective now                   EDUCATION NEEDS:   No education needs have been identified at this time  Skin:  Skin Assessment: Reviewed RN Assessment  Last BM:  1/29  Height:   Ht Readings from Last 1 Encounters:  12/07/21 _0  (1.956 m)    Weight:   Wt Readings from Last 1 Encounters:  12/07/21 72.6 kg    BMI:  Body mass index is 18.97 kg/m.  Estimated Nutritional Needs:   Kcal:  2400-2600  Protein:  120-130 grams  Fluid:  >2.4L     AmaTheone StanleyMS, RD, LDN (she/her/hers) RD pager number and weekend/on-call pager number located in [place]

## 2021-12-08 NOTE — Progress Notes (Signed)
°  Transition of Care [facility]) Screening Note   Patient Details  Name: Carlos [name] Date of Birth: 1986-09-12   Transition of Care [facility]) CM/SW Contact:    Tom-Johnson, Hershal Coria, RN Phone Number: 12/08/2021, 5:02 PM  Patient is from home and lives with his girlfriend with five children and one on the way. Admitted with acute on chronic pancreatitis. Has hx of  alcoholic pancreatitis with complications of pseudocyst, and splenic vein thrombosis. Also noted to have esophageal candidiasis. Registered Dietician following for malnutrition due to illness. Patient is currently employed by [employer]. Independent with care prior to hospitalization. PCP is Jacalyn Lefevre, MD and uses [facility] on [address]. Transition of Care Department [facility]) has reviewed patient and no recommendations noted at this time. We will continue to monitor patient advancement through interdisciplinary progression rounds. If new patient transition needs arise, please place a TOC consult.

## 2021-12-08 NOTE — Progress Notes (Signed)
HPI: Carlos [name] is a 36 y.o. male with medical history significant of DM type I, alcoholic pancreatitis with complications of pseudocyst, and splenic vein thrombosis who presents with complaints of worsening epigastric abdominal pain.  He was first diagnosed with pancreatitis back in 09/2020 that was thought related to alcohol, but since that time patient reports that he is not drank any alcohol.  He had been admitted into the hospital in 09/2021 after having syncopal episode with new onset DKA and recurrence of pancreatitis.  His started on insulin at that time, but reports he is continue to have abdominal pain.  He had  undergone EGD/EUS for pancreatic pseudocyst on 12/02/2021 by Dr. [name] which showed grade D erosive esophagitis with Candida present, H. pylori negative gastritis, and hiatal hernia.  No stenting was done during the procedure.  He was prescribed medications and so to pick up everything except for the oxycodone.  He reports taking the medications as prescribed, but noted worsening abdominal pain that he currently rates 8 out of 10 on the pain scale.  Associated symptoms include nausea and vomiting.  He was able to keep some food and liquids down intermittently.  Denies any in fever, chest pain, shortness of breath, or dysuria symptoms.  Patient does admit to smoking Black and mild daily on average.  ED Course: Upon admission into the emergency department patient was noted to be afebrile, blood pressure 138/104-160/113, and all other vital signs maintained.  Labs significant for elevated MCV/MCH, sodium 130, potassium 3.2, chloride 94, glucose 231, anion gap 12, alkaline phosphatase 907, lipase 35, AST 155, ALT 298, and total bilirubin 1.3. CT scan of the abdomen pelvis noted interval changes acute pancreatitis with interval decrease in size of pancreatic tail pseudocyst, resolution of other previously demonstrated pseudocysts, gallbladder distention, and mild pericholecystic fluid thought  to be related to changes of acute pancreatitis.  [facility] have been formally consulted.  Patient having given 1 L normal saline IV fluids, morphine, Pepcid, and GI cocktail.  TR[facility] called to admit  Subjective Still with pain but better after oxycodone  Physical Exam:  Constitutional: Thin middle-age male who appears to be in discomfort Vitals:   12/07/21 2300 12/08/21 0140 12/08/21 0536 12/08/21 0859  BP: (!) 156/103 (!) 154/89 (!) 148/99 (!) 163/111  Pulse: 90 80 87 86  Resp: 13 19 18    Temp:  98.2 F (36.8 C) 98.3 F (36.8 C) 98.5 F (36.9 C)  TempSrc:  Oral  Oral  SpO2: 100% 100% 100% 100%  Weight:      Height:       Eyes: PERRL, lids and conjunctivae normal ENMT: Mucous membranes are moist. Posterior pharynx clear of any exudate or lesions. Normal dentition.  Neck: normal, supple, no masses, no thyromegaly Respiratory: clear to auscultation bilaterally, no wheezing, no crackles. Normal respiratory effort. No accessory muscle use.  Cardiovascular: Regular rate and rhythm, no murmurs / rubs / gallops. No extremity edema. 2+ pedal pulses. No carotid bruits.  Abdomen: Tenderness to palpation epigastrically and in of the right upper quadrant with some guarding but no rebound tenderness appreciated.  Bowel sounds are present, but decreased. Musculoskeletal: no clubbing / cyanosis. No joint deformity upper and lower extremities.  Normal muscle tone.  Skin: no rashes, lesions, ulcers. No induration Neurologic: CN 2-12 grossly intact. Strength 5/5 in all 4.  Psychiatric: Normal judgment and insight. Alert and oriented x 3. Normal mood.     Labs on Admission: I have personally reviewed following labs  and imaging studies  CBC: Recent Labs  Lab 12/07/21 0549 12/08/21 0427  WBC 5.1 7.1  HGB 13.0 10.9*  HCT 37.5* 31.2*  MCV 105.9* 103.7*  PLT 400 336    Basic Metabolic Panel: Recent Labs  Lab 12/07/21 0549 12/08/21 0427  NA 130* 136  K 3.2* 3.9  CL 94* 102  CO2 24 24   GLUCOSE 231* 87  BUN 6 <5*  CREATININE 0.81 0.72  CALCIUM 9.0 8.9  MG 1.6*  --     GFR: Estimated Creatinine Clearance: 131.1 mL/min (by C-G formula based on SCr of 0.72 mg/dL). Liver Function Tests: Recent Labs  Lab 12/07/21 0549 12/08/21 0427  AST 155* 93*  ALT 298* 176*  ALKPHOS 907* 663*  BILITOT 1.3* 1.0  PROT 6.9 5.9*  ALBUMIN 3.5 3.1*    Recent Labs  Lab 12/07/21 0549  LIPASE 35    No results for input(s): AMMONIA in the last 168 hours. Coagulation Profile: No results for input(s): INR, PROTIME in the last 168 hours. Cardiac Enzymes: No results for input(s): CKTOTAL, CKMB, CKMBINDEX, TROPONINI in the last 168 hours. BNP (last 3 results) No results for input(s): PROBNP in the last 8760 hours. HbA1C: No results for input(s): HGBA1C in the last 72 hours. CBG: Recent Labs  Lab 12/02/21 1009 12/07/21 1839 12/07/21 2324 12/08/21 0120 12/08/21 0723  GLUCAP 117* 213* 296* 82 133*    Lipid Profile: Recent Labs    12/08/21 0427  TRIG 35    Thyroid Function Tests: No results for input(s): TSH, T4TOTAL, FREET4, T3FREE, THYROIDAB in the last 72 hours. Anemia Panel: No results for input(s): VITAMINB12, FOLATE, FERRITIN, TIBC, IRON, RETICCTPCT in the last 72 hours. Urine analysis:    Component Value Date/Time   COLORURINE YELLOW 12/07/2021 1236   APPEARANCEUR CLEAR 12/07/2021 1236   LABSPEC 1.010 12/07/2021 1236   PHURINE 6.0 12/07/2021 1236   GLUCOSEU >=500 (A) 12/07/2021 1236   HGBUR NEGATIVE 12/07/2021 1236   BILIRUBINUR NEGATIVE 12/07/2021 1236   KETONESUR NEGATIVE 12/07/2021 1236   PROTEINUR NEGATIVE 12/07/2021 1236   NITRITE NEGATIVE 12/07/2021 1236   LEUKOCYTESUR NEGATIVE 12/07/2021 1236   Sepsis Labs: No results found for this or any previous visit (from the past 240 hour(s)).   Radiological Exams on Admission: CT ABDOMEN PELVIS W CONTRAST  Result Date: 12/07/2021 CLINICAL DATA:  Epigastric abdominal pain. EXAM: CT ABDOMEN AND PELVIS  WITH CONTRAST TECHNIQUE: Multidetector CT imaging of the abdomen and pelvis was performed using the standard protocol following bolus administration of intravenous contrast. RADIATION DOSE REDUCTION: This exam was performed according to the departmental dose-optimization program which includes automated exposure control, adjustment of the mA and/or kV according to patient size and/or use of iterative reconstruction technique. CONTRAST:  80mL OMNIPAQUE IOHEXOL 300 MG/ML  SOLN COMPARISON:  10/07/2021 FINDINGS: Lower chest: Unremarkable. Hepatobiliary: Mildly dilated gallbladder with no visible gallstones and no gallbladder wall thickening. Small amount of pericholecystic fluid. Unremarkable liver. An oval area of fat deposition is noted in the liver adjacent to the falciform ligament. Pancreas: Interval resolution and marked decrease in size of previously demonstrated large pancreatic and peripancreatic fluid collections. There is a residual collection in the distal pancreatic tail with mild thin internal septation measuring 3.7 x 2.4 cm on image number 45/3. This previously measured 9.6 x 8.3 cm. Multiple small pancreatic calcifications are again demonstrated. Diffuse peripancreatic edema. Spleen: Normal in size without focal abnormality. Adrenals/Urinary Tract: Adrenal glands are unremarkable. Kidneys are normal, without renal calculi, focal lesion, or  hydronephrosis. Bladder is unremarkable. Stomach/Bowel: Unremarkable stomach, small bowel and colon. The appendix is not well visualized. No findings suspicious for appendicitis. Vascular/Lymphatic: No significant vascular findings are present. No enlarged abdominal or pelvic lymph nodes. Normal opacification of the splenic vein is currently demonstrated. Reproductive: Prostate is unremarkable. Other: No abdominal wall hernia or abnormality. No abdominopelvic ascites. Musculoskeletal: Unremarkable bones. IMPRESSION: 1. Interval changes of acute pancreatitis. 2.  Interval significant decrease in size of a pancreatic tail pseudocyst with resolution of other previously demonstrated pseudocysts. 3. Stable changes of chronic pancreatitis. 4. Gallbladder distension and mild pericholecystic fluid, most likely due to the adjacent changes of acute pancreatitis. Electronically Signed   By: Beckie Salts M.D.   On: 12/07/2021 08:56   US Abdomen Limited RUQ (LIVER/GB)  Result Date: 12/07/2021 CLINICAL DATA:  Right upper quadrant pain for 1 year. EXAM: ULTRASOUND ABDOMEN LIMITED RIGHT UPPER QUADRANT COMPARISON:  CT abdomen and pelvis 12/07/2021. Right upper quadrant ultrasound 09/26/2020. FINDINGS: Gallbladder: Moderately distended gallbladder containing a small amount of sludge. Possible 4 mm polyp or sludge ball for which no follow-up imaging is recommended. No shadowing gallstones or gallbladder wall thickening. No sonographic Murphy sign noted by sonographer. Common bile duct: Diameter: 6 mm Liver: No focal lesion identified. Within normal limits in parenchymal echogenicity. Portal vein is patent on color Doppler imaging with normal direction of blood flow towards the liver. Other: None. IMPRESSION: 1. Distended gallbladder containing sludge. No gallstones or evidence of cholecystitis. 2. No biliary dilatation. Electronically Signed   By: Sebastian Ache M.D.   On: 12/07/2021 13:40    EKG: Independently reviewed.  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  Patient presents with progressively worsening epigastric abdominal pain with nausea and vomiting since recent EGD on 1/25.  Lipase was within normal limits, but CT imaging noted acute pancreatitis with interval improvement in prior pseudocyst.  GI have been formally consulted and noted symptoms could be secondary to pancreatitis and/or esophagitis. -Admit to medical telemetry   -Clear liquid diet and advance as tolerated -triglyceride level 35 -Normal saline IV  fluids at 150 mL/h  -Continue Creon -Oxycodone/morphine IV as needed for pain -Antiemetics as needed nausea -Appreciate GI consultative services, we will follow-up for any further recommendations.  -Status post recent ERCP stent deferred secondary to candidal esophagitis  Gallbladder distention: Acute.  CT scan of the abdomen pelvis noted dilated gallbladder with no visible gallstones and a small amount of pericholecystic fluid present.  Abdominal ultrasound noted distended gallbladder with  sludge present and no signs of cholecystitis. -Continue to monitor         Hypokalemia: Acute.  Potassium noted to be 3.2 on admission. -Give potassium chloride 30 mEq IV -Continue to monitor and replace as needed  Hyponatremia: Acute.  Sodium 130 on admission.  Suspect hypovolemic hyponatremia given.  Nausea and vomiting. -IV fluids as noted above -Recheck sodium levels in a.m.  Diabetes mellitus type 1, uncontrolled: On admission glucose elevated up to 231 without signs of elevated anion gap.  Last available hemoglobin A1c was 10.1 on 09/14/2021.  Home regimen includes Lantus 12 units daily and NovoLog 4 units 3 times daily with meals. -Hypoglycemic protocols -On insulin regimen continue patient's home insulin regimen -Adjust as needed  Transaminitis  hyperbilirubinemia: Initial labs revealed alkaline phosphatase 907, AST 155, ALT 298, and total bilirubin 1.3. -Repeat CMP in a.m.  Candidal esophagitis: Noted during recent EGD from 1/25.  Patient reports that he has been taking  the fluconazole as prescribed. -Continue fluconazole  Gastritis: Noted on recent EGD.  H. pylori testing was negative. 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  Splenic vein thrombosis: Patient was noted to have splenic vein thrombosis with collaterals during hospitalization in 09/2021, but recommended not to be on  anticoagulations as symptoms were thought to be chronic in nature after consulting Dr.  Bertis RuddyGorsuch of hematology oncology.  DVT prophylaxis: Lovenox Code Status: Full Family Communication: None requested Disposition Plan: Likely discharge home once medically stable Consults called: GI Admission status: Inpatient, require more than 2 midnight stay in the setting of need of aggressive IV fluid hydration  Roshon Duell A MD [facility]

## 2021-12-08 NOTE — Progress Notes (Signed)
Progress Note   Subjective  Chief Complaint: Abdominal pain  Patient states he is feeling well today, pain better controlled with pain medications. Stool a liquid diet, no nausea or vomiting, and states he is very hungry. Patient is walking, no shortness of breath or chest pain. Patient feels he needs to have a bowel movement at this time, no constipation. Denies fevers or chills    Objective   Vital signs in last 24 hours: Temp:  [98.2 F (36.8 C)-98.5 F (36.9 C)] 98.5 F (36.9 C) (01/31 0859) Pulse Rate:  [79-90] 86 (01/31 0859) Resp:  [9-21] 12 (01/31 0859) BP: (128-163)/(89-117) 163/111 (01/31 0859) SpO2:  [100 %] 100 % (01/31 0859) Last BM Date: 12/06/21  General:   Pleasant, thin appearing AA male in no acute distress Heart:  regular rate and rhythm, no murmurs or gallops Pulm: Clear anteriorly; no wheezing Abdomen:  Soft, Flat AB, Sluggish bowel sounds. moderate tenderness in the epigastrium and in the RUQ. With guarding and Without rebound, without hepatomegaly. Extremities:  Without edema.  Intake/Output from previous day: 01/30 0701 - 01/31 0700 In: 2667.8 [P.O.:360; I.V.:2059.8; IV Piggyback:248] Out: 1700 [Urine:1700] Intake/Output this shift: Total I/O In: 1200 [P.O.:1200] Out: 500 [Urine:500]  Lab Results: Recent Labs    12/07/21 0549 12/08/21 0427  WBC 5.1 7.1  HGB 13.0 10.9*  HCT 37.5* 31.2*  PLT 400 336   BMET Recent Labs    12/07/21 0549 12/08/21 0427  NA 130* 136  K 3.2* 3.9  CL 94* 102  CO2 24 24  GLUCOSE 231* 87  BUN 6 <5*  CREATININE 0.81 0.72  CALCIUM 9.0 8.9   LFT Recent Labs    12/08/21 0427  PROT 5.9*  ALBUMIN 3.1*  AST 93*  ALT 176*  ALKPHOS 663*  BILITOT 1.0   PT/INR No results for input(s): LABPROT, INR in the last 72 hours.  Studies/Results: CT ABDOMEN PELVIS W CONTRAST  Result Date: 12/07/2021 CLINICAL DATA:  Epigastric abdominal pain. EXAM: CT ABDOMEN AND PELVIS WITH CONTRAST TECHNIQUE:  Multidetector CT imaging of the abdomen and pelvis was performed using the standard protocol following bolus administration of intravenous contrast. RADIATION DOSE REDUCTION: This exam was performed according to the departmental dose-optimization program which includes automated exposure control, adjustment of the mA and/or kV according to patient size and/or use of iterative reconstruction technique. CONTRAST:  8mL OMNIPAQUE IOHEXOL 300 MG/ML  SOLN COMPARISON:  10/07/2021 FINDINGS: Lower chest: Unremarkable. Hepatobiliary: Mildly dilated gallbladder with no visible gallstones and no gallbladder wall thickening. Small amount of pericholecystic fluid. Unremarkable liver. An oval area of fat deposition is noted in the liver adjacent to the falciform ligament. Pancreas: Interval resolution and marked decrease in size of previously demonstrated large pancreatic and peripancreatic fluid collections. There is a residual collection in the distal pancreatic tail with mild thin internal septation measuring 3.7 x 2.4 cm on image number 45/3. This previously measured 9.6 x 8.3 cm. Multiple small pancreatic calcifications are again demonstrated. Diffuse peripancreatic edema. Spleen: Normal in size without focal abnormality. Adrenals/Urinary Tract: Adrenal glands are unremarkable. Kidneys are normal, without renal calculi, focal lesion, or hydronephrosis. Bladder is unremarkable. Stomach/Bowel: Unremarkable stomach, small bowel and colon. The appendix is not well visualized. No findings suspicious for appendicitis. Vascular/Lymphatic: No significant vascular findings are present. No enlarged abdominal or pelvic lymph nodes. Normal opacification of the splenic vein is currently demonstrated. Reproductive: Prostate is unremarkable. Other: No abdominal wall hernia or abnormality. No abdominopelvic ascites. Musculoskeletal: Unremarkable bones. IMPRESSION:  1. Interval changes of acute pancreatitis. 2. Interval significant decrease  in size of a pancreatic tail pseudocyst with resolution of other previously demonstrated pseudocysts. 3. Stable changes of chronic pancreatitis. 4. Gallbladder distension and mild pericholecystic fluid, most likely due to the adjacent changes of acute pancreatitis. Electronically Signed   By: Beckie Salts M.D.   On: 12/07/2021 08:56   US Abdomen Limited RUQ (LIVER/GB)  Result Date: 12/07/2021 CLINICAL DATA:  Right upper quadrant pain for 1 year. EXAM: ULTRASOUND ABDOMEN LIMITED RIGHT UPPER QUADRANT COMPARISON:  CT abdomen and pelvis 12/07/2021. Right upper quadrant ultrasound 09/26/2020. FINDINGS: Gallbladder: Moderately distended gallbladder containing a small amount of sludge. Possible 4 mm polyp or sludge ball for which no follow-up imaging is recommended. No shadowing gallstones or gallbladder wall thickening. No sonographic Murphy sign noted by sonographer. Common bile duct: Diameter: 6 mm Liver: No focal lesion identified. Within normal limits in parenchymal echogenicity. Portal vein is patent on color Doppler imaging with normal direction of blood flow towards the liver. Other: None. IMPRESSION: 1. Distended gallbladder containing sludge. No gallstones or evidence of cholecystitis. 2. No biliary dilatation. Electronically Signed   By: Sebastian Ache M.D.   On: 12/07/2021 13:40      Impression/Plan:   Acute on Chronic Pancreatitis with history of complication pseudocyst, splenic vein thrombosis, new onset diabetes presents with epigastric pain s/p EUS/EGD CT shows resolving complications, lipase negative with acute pancreatitis changes on CT with gallbladder distension Right upper quadrant ultrasound shows sludge, distended gallbladder without gallstones, no biliary dilatation. -We will get HIDA to evaluate further. -Continue rehydration, pain management per primary team. -If HIDA unable to be done today, can advance patient diet as he is hungry.   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 Pylori Avoid NSAIDs -Continue PPI twice daily   Future Appointments  Date Time Provider Department Center  12/30/2021  8:30 AM [name], MD [facility] None      LOS: 1 day   Doree Albee  12/08/2021, 12:14 PM

## 2021-12-08 NOTE — Progress Notes (Addendum)
Inpatient Diabetes Program Recommendations  AACE/ADA: New Consensus Statement on Inpatient Glycemic Control (2015)  Target Ranges:  Prepandial:   less than 140 mg/dL      Peak postprandial:   less than 180 mg/dL (1-2 hours)      Critically ill patients:  140 - 180 mg/dL   Lab Results  Component Value Date   GLUCAP 76 12/08/2021   HGBA1C 10.1 (H) 09/14/2021    Review of Glycemic Control  Latest Reference Range & Units 12/08/21 07:23 12/08/21 11:15  Glucose-Capillary 70 - 99 mg/dL 397 (H) 76  (H): Data is abnormally high  Diabetes history: DM1(does not make insulin.  Needs correction, basal and meal coverage)  Outpatient Diabetes medications: Semglee 12 units QD, Novolog 4 units TID  Current orders for Inpatient glycemic control: Semglee 12 units QD, Novolog 0-9 units TID and 4 units [facility]  Inpatient Diabetes Program Recommendations:    1-Semglee 10 units QD 2-Novolog 3 units TID with meals if eats at least 50%  Addendum@12 :26: Spoke with patient at [facility]. He has been doing well with his new diagnosis of T1DM.  Denies difficulties obtaining insulins.  Checks his CBGs at least 4 x a day.  He reports being 60-70's in the mornings.  May want to discuss decreasing basal with PCP.  No severe hypoglycemia at home.  He is current with his pcp.  Is interested in [place] at [place].  Has [employer].  Will ask MD for order to place [employer] at DC.  Will continue to follow while inpatient.  Thank you, Dulce Sellar, MSN, RN Diabetes Coordinator Inpatient Diabetes Program [phone] (team pager from 8a-5p)

## 2021-12-09 ENCOUNTER — Inpatient Hospital Stay (HOSPITAL_COMMUNITY): Payer: Managed Care, Other (non HMO)

## 2021-12-09 DIAGNOSIS — F17290 Nicotine dependence, other tobacco product, uncomplicated: Secondary | ICD-10-CM | POA: Diagnosis present

## 2021-12-09 DIAGNOSIS — K219 Gastro-esophageal reflux disease without esophagitis: Secondary | ICD-10-CM | POA: Diagnosis present

## 2021-12-09 DIAGNOSIS — E1010 Type 1 diabetes mellitus with ketoacidosis without coma: Secondary | ICD-10-CM | POA: Diagnosis present

## 2021-12-09 DIAGNOSIS — R188 Other ascites: Secondary | ICD-10-CM | POA: Diagnosis present

## 2021-12-09 DIAGNOSIS — Z8249 Family history of ischemic heart disease and other diseases of the circulatory system: Secondary | ICD-10-CM | POA: Diagnosis not present

## 2021-12-09 DIAGNOSIS — K2970 Gastritis, unspecified, without bleeding: Secondary | ICD-10-CM | POA: Diagnosis present

## 2021-12-09 DIAGNOSIS — F1010 Alcohol abuse, uncomplicated: Secondary | ICD-10-CM | POA: Diagnosis present

## 2021-12-09 DIAGNOSIS — F419 Anxiety disorder, unspecified: Secondary | ICD-10-CM | POA: Diagnosis present

## 2021-12-09 DIAGNOSIS — K863 Pseudocyst of pancreas: Secondary | ICD-10-CM | POA: Diagnosis present

## 2021-12-09 DIAGNOSIS — K2210 Ulcer of esophagus without bleeding: Secondary | ICD-10-CM | POA: Diagnosis present

## 2021-12-09 DIAGNOSIS — K861 Other chronic pancreatitis: Secondary | ICD-10-CM | POA: Diagnosis present

## 2021-12-09 DIAGNOSIS — R1013 Epigastric pain: Secondary | ICD-10-CM | POA: Diagnosis not present

## 2021-12-09 DIAGNOSIS — B3781 Candidal esophagitis: Secondary | ICD-10-CM | POA: Diagnosis present

## 2021-12-09 DIAGNOSIS — K859 Acute pancreatitis without necrosis or infection, unspecified: Secondary | ICD-10-CM | POA: Diagnosis present

## 2021-12-09 DIAGNOSIS — E876 Hypokalemia: Secondary | ICD-10-CM | POA: Diagnosis present

## 2021-12-09 DIAGNOSIS — Z20822 Contact with and (suspected) exposure to covid-19: Secondary | ICD-10-CM | POA: Diagnosis present

## 2021-12-09 DIAGNOSIS — I82890 Acute embolism and thrombosis of other specified veins: Secondary | ICD-10-CM | POA: Diagnosis present

## 2021-12-09 DIAGNOSIS — K760 Fatty (change of) liver, not elsewhere classified: Secondary | ICD-10-CM | POA: Diagnosis present

## 2021-12-09 DIAGNOSIS — E871 Hypo-osmolality and hyponatremia: Secondary | ICD-10-CM | POA: Diagnosis present

## 2021-12-09 LAB — COMPREHENSIVE METABOLIC PANEL
ALT: 157 U/L — ABNORMAL HIGH (ref 0–44)
AST: 184 U/L — ABNORMAL HIGH (ref 15–41)
Albumin: 3.1 g/dL — ABNORMAL LOW (ref 3.5–5.0)
Alkaline Phosphatase: 970 U/L — ABNORMAL HIGH (ref 38–126)
Anion gap: 10 (ref 5–15)
BUN: <5 mg/dL — ABNORMAL LOW (ref 6–20)
CO2: 27 mmol/L (ref 22–32)
Calcium: 9 mg/dL (ref 8.9–10.3)
Chloride: 98 mmol/L (ref 98–111)
Creatinine, Ser: 0.73 mg/dL (ref 0.61–1.24)
GFR, Estimated: >60 mL/min (ref >60)
Glucose, Bld: 192 mg/dL — ABNORMAL HIGH (ref 70–99)
Potassium: 4 mmol/L (ref 3.5–5.1)
Sodium: 135 mmol/L (ref 135–145)
Total Bilirubin: 1 mg/dL (ref 0.3–1.2)
Total Protein: 6 g/dL — ABNORMAL LOW (ref 6.5–8.1)

## 2021-12-09 LAB — CBC WITH DIFFERENTIAL/PLATELET
Abs Immature Granulocytes: 0.02 10*3/uL (ref 0.00–0.07)
Basophils Absolute: 0 10*3/uL (ref 0.0–0.1)
Basophils Relative: 0 %
Eosinophils Absolute: 0.1 10*3/uL (ref 0.0–0.5)
Eosinophils Relative: 2 %
HCT: 32.6 % — ABNORMAL LOW (ref 39.0–52.0)
Hemoglobin: 10.9 g/dL — ABNORMAL LOW (ref 13.0–17.0)
Immature Granulocytes: 0 %
Lymphocytes Relative: 15 %
Lymphs Abs: 0.9 10*3/uL (ref 0.7–4.0)
MCH: 35 pg — ABNORMAL HIGH (ref 26.0–34.0)
MCHC: 33.4 g/dL (ref 30.0–36.0)
MCV: 104.8 fL — ABNORMAL HIGH (ref 80.0–100.0)
Monocytes Absolute: 0.6 10*3/uL (ref 0.1–1.0)
Monocytes Relative: 11 %
Neutro Abs: 4.3 10*3/uL (ref 1.7–7.7)
Neutrophils Relative %: 72 %
Platelets: 371 10*3/uL (ref 150–400)
RBC: 3.11 MIL/uL — ABNORMAL LOW (ref 4.22–5.81)
RDW: 12.2 % (ref 11.5–15.5)
WBC: 6 10*3/uL (ref 4.0–10.5)
nRBC: 0 % (ref 0.0–0.2)

## 2021-12-09 LAB — GLUCOSE, CAPILLARY
Glucose-Capillary: 180 mg/dL — ABNORMAL HIGH (ref 70–99)
Glucose-Capillary: 143 mg/dL — ABNORMAL HIGH (ref 70–99)
Glucose-Capillary: 256 mg/dL — ABNORMAL HIGH (ref 70–99)

## 2021-12-09 LAB — LIPASE, BLOOD: Lipase: 45 U/L (ref 11–51)

## 2021-12-09 MED ORDER — TECHNETIUM TC 99M MEBROFENIN IV KIT
5.3000 | PACK | Freq: Once | INTRAVENOUS | Status: AC | PRN
  Administered 2021-12-09: 5.3 via INTRAVENOUS

## 2021-12-09 NOTE — Progress Notes (Signed)
Progress Note   Subjective  Chief Complaint: Abdominal pain  Patient states pain is 8 or 9 out of 10, has not had pain medication since 5 AM. Pain is more right upper quadrant to right flank. Patient has not had any nausea or vomiting, he is hungry but currently n.p.o. waiting for HIDA last bowel movement was last night denies melena or hematochezia. Patient walking around the room does not walk around the hallways.    Objective   Vital signs in last 24 hours: Temp:  [98.2 F (36.8 C)-98.9 F (37.2 C)] 98.9 F (37.2 C) (02/01 0855) Pulse Rate:  [85-89] 85 (02/01 0855) Resp:  [17-18] 18 (02/01 0855) BP: (134-152)/(96-100) 135/99 (02/01 0855) SpO2:  [100 %] 100 % (02/01 0855) Last BM Date: 12/08/21  General:   Pleasant, thin appearing AA male in no acute distress Heart:  regular rate and rhythm, no murmurs or gallops Pulm: Clear anteriorly; no wheezing Abdomen:  Soft, Flat AB, normal bowel sounds. moderate RUQ. With guarding and Without rebound, without hepatomegaly. Extremities:  Without edema.  Intake/Output from previous day: 01/31 0701 - 02/01 0700 In: 1440 [P.O.:1440] Out: 4075 [Urine:4075] Intake/Output this shift: Total I/O In: 0  Out: 800 [Urine:800]  Lab Results: Recent Labs    12/07/21 0549 12/08/21 0427 12/09/21 0821  WBC 5.1 7.1 6.0  HGB 13.0 10.9* 10.9*  HCT 37.5* 31.2* 32.6*  PLT 400 336 371   BMET Recent Labs    12/07/21 0549 12/08/21 0427 12/09/21 0821  NA 130* 136 135  K 3.2* 3.9 4.0  CL 94* 102 98  CO2 _0 GLUCOSE 231* 87 192*  BUN 6 <5* <5*  CREATININE 0.81 0.72 0.73  CALCIUM 9.0 8.9 9.0   LFT Recent Labs    12/09/21 0821  PROT 6.0*  ALBUMIN 3.1*  AST 184*  ALT 157*  ALKPHOS 970*  BILITOT 1.0   PT/INR No results for input(s): LABPROT, INR in the last 72 hours.  Studies/Results: US Abdomen Limited RUQ (LIVER/GB)  Result Date: 12/07/2021 CLINICAL DATA:  Right upper quadrant pain for 1 year. EXAM: ULTRASOUND  ABDOMEN LIMITED RIGHT UPPER QUADRANT COMPARISON:  CT abdomen and pelvis 12/07/2021. Right upper quadrant ultrasound 09/26/2020. FINDINGS: Gallbladder: Moderately distended gallbladder containing a small amount of sludge. Possible 4 mm polyp or sludge ball for which no follow-up imaging is recommended. No shadowing gallstones or gallbladder wall thickening. No sonographic Murphy sign noted by sonographer. Common bile duct: Diameter: 6 mm Liver: No focal lesion identified. Within normal limits in parenchymal echogenicity. Portal vein is patent on color Doppler imaging with normal direction of blood flow towards the liver. Other: None. IMPRESSION: 1. Distended gallbladder containing sludge. No gallstones or evidence of cholecystitis. 2. No biliary dilatation. Electronically Signed   By: Logan Bores M.D.   On: 12/07/2021 13:40      Impression/Plan:   Acute on Chronic Pancreatitis with history of complication pseudocyst, splenic vein thrombosis, new onset diabetes presents with epigastric pain s/p EUS/EGD AST 184 ALT 157  Alkphos 970 TBili 1.0 CT shows resolving complications, lipase negative with acute pancreatitis changes on CT with gallbladder distension Right upper quadrant ultrasound shows sludge, distended gallbladder without gallstones, no biliary dilatation. -Alk phos increased today, decreasing LFTs, total bilirubin stable. 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 Pylori Avoid NSAIDs -Continue PPI twice daily   Future Appointments  Date Time Provider [place]  12/09/2021 12:15 PM [facility] 2 [facility]  12/30/2021  8:30 AM [name], MD  [facility] None      LOS: 2 days   Carlos [name]  12/09/2021, 12:11 PM

## 2021-12-09 NOTE — Plan of Care (Signed)
  Problem: Activity: Goal: Risk for activity intolerance will decrease Outcome: Progressing   

## 2021-12-09 NOTE — Progress Notes (Signed)
HPI: Carlos [name] is a 36 y.o. male with medical history significant of DM type I, alcoholic pancreatitis with complications of pseudocyst, and splenic vein thrombosis who presents with complaints of worsening epigastric abdominal pain.  He was first diagnosed with pancreatitis back in 09/2020 that was thought related to alcohol, but since that time patient reports that he is not drank any alcohol.  He had been admitted into the hospital in 09/2021 after having syncopal episode with new onset DKA and recurrence of pancreatitis.  His started on insulin at that time, but reports he is continue to have abdominal pain.  He had  undergone EGD/EUS for pancreatic pseudocyst on 12/02/2021 by Dr. [name] which showed grade D erosive esophagitis with Candida present, H. pylori negative gastritis, and hiatal hernia.  No stenting was done during the procedure.  He was prescribed medications and so to pick up everything except for the oxycodone.  He reports taking the medications as prescribed, but noted worsening abdominal pain that he currently rates 8 out of 10 on the pain scale.  Associated symptoms include nausea and vomiting.  He was able to keep some food and liquids down intermittently.  Denies any in fever, chest pain, shortness of breath, or dysuria symptoms.  Patient does admit to smoking Black and mild daily on average.  ED Course: Upon admission into the emergency department patient was noted to be afebrile, blood pressure 138/104-160/113, and all other vital signs maintained.  Labs significant for elevated MCV/MCH, sodium 130, potassium 3.2, chloride 94, glucose 231, anion gap 12, alkaline phosphatase 907, lipase 35, AST 155, ALT 298, and total bilirubin 1.3. CT scan of the abdomen pelvis noted interval changes acute pancreatitis with interval decrease in size of pancreatic tail pseudocyst, resolution of other previously demonstrated pseudocysts, gallbladder distention, and mild pericholecystic fluid thought  to be related to changes of acute pancreatitis.  [facility] have been formally consulted.  Patient having given 1 L normal saline IV fluids, morphine, Pepcid, and GI cocktail.  TRH called to admit  Subjective Still with some pain.  He is hungry.  Physical Exam:  Constitutional: Thin middle-age male who appears to be in discomfort Vitals:   12/08/21 1625 12/08/21 2052 12/09/21 0532 12/09/21 0855  BP: (!) 134/100 (!) 152/96 (!) 143/99 (!) 135/99  Pulse: 86 89 86 85  Resp: 18 18 17 18   Temp: 98.3 F (36.8 C) 98.2 F (36.8 C) 98.4 F (36.9 C) 98.9 F (37.2 C)  TempSrc:  Oral    SpO2: 100% 100% 100% 100%  Weight:      Height:       Eyes: PERRL, lids and conjunctivae normal ENMT: Mucous membranes are moist. Posterior pharynx clear of any exudate or lesions. Normal dentition.  Neck: normal, supple, no masses, no thyromegaly Respiratory: clear to auscultation bilaterally, no wheezing, no crackles. Normal respiratory effort. No accessory muscle use.  Cardiovascular: Regular rate and rhythm, no murmurs / rubs / gallops. No extremity edema. 2+ pedal pulses. No carotid bruits.  Abdomen: Tenderness to palpation epigastrically and in of the right upper quadrant with some guarding but no rebound tenderness appreciated.  Bowel sounds are present, but decreased. Musculoskeletal: no clubbing / cyanosis. No joint deformity upper and lower extremities.  Normal muscle tone.  Skin: no rashes, lesions, ulcers. No induration Neurologic: CN 2-12 grossly intact. Strength 5/5 in all 4.  Psychiatric: Normal judgment and insight. Alert and oriented x 3. Normal mood.     Labs on Admission: I have  personally reviewed following labs and imaging studies  CBC: Recent Labs  Lab 12/07/21 0549 12/08/21 0427 12/09/21 0821  WBC 5.1 7.1 6.0  NEUTROABS  --   --  4.3  HGB 13.0 10.9* 10.9*  HCT 37.5* 31.2* 32.6*  MCV 105.9* 103.7* 104.8*  PLT 400 336 371   Basic Metabolic Panel: Recent Labs  Lab  12/07/21 0549 12/08/21 0427 12/09/21 0821  NA 130* 136 135  K 3.2* 3.9 4.0  CL 94* 102 98  CO2 24 24 27   GLUCOSE 231* 87 192*  BUN 6 <5* <5*  CREATININE 0.81 0.72 0.73  CALCIUM 9.0 8.9 9.0  MG 1.6*  --   --    GFR: Estimated Creatinine Clearance: 131.1 mL/min (by C-G formula based on SCr of 0.73 mg/dL). Liver Function Tests: Recent Labs  Lab 12/07/21 0549 12/08/21 0427 12/09/21 0821  AST 155* 93* 184*  ALT 298* 176* 157*  ALKPHOS 907* 663* 970*  BILITOT 1.3* 1.0 1.0  PROT 6.9 5.9* 6.0*  ALBUMIN 3.5 3.1* 3.1*   Recent Labs  Lab 12/07/21 0549 12/09/21 0821  LIPASE 35 45   No results for input(s): AMMONIA in the last 168 hours. Coagulation Profile: No results for input(s): INR, PROTIME in the last 168 hours. Cardiac Enzymes: No results for input(s): CKTOTAL, CKMB, CKMBINDEX, TROPONINI in the last 168 hours. BNP (last 3 results) No results for input(s): PROBNP in the last 8760 hours. HbA1C: No results for input(s): HGBA1C in the last 72 hours. CBG: Recent Labs  Lab 12/08/21 1115 12/08/21 1631 12/08/21 2051 12/09/21 0725 12/09/21 1133  GLUCAP 76 136* 216* 180* 143*   Lipid Profile: Recent Labs    12/08/21 0427  TRIG 35   Thyroid Function Tests: No results for input(s): TSH, T4TOTAL, FREET4, T3FREE, THYROIDAB in the last 72 hours. Anemia Panel: No results for input(s): VITAMINB12, FOLATE, FERRITIN, TIBC, IRON, RETICCTPCT in the last 72 hours. Urine analysis:    Component Value Date/Time   COLORURINE YELLOW 12/07/2021 1236   APPEARANCEUR CLEAR 12/07/2021 1236   LABSPEC 1.010 12/07/2021 1236   PHURINE 6.0 12/07/2021 1236   GLUCOSEU >=500 (A) 12/07/2021 1236   HGBUR NEGATIVE 12/07/2021 1236   BILIRUBINUR NEGATIVE 12/07/2021 1236   KETONESUR NEGATIVE 12/07/2021 1236   PROTEINUR NEGATIVE 12/07/2021 1236   NITRITE NEGATIVE 12/07/2021 1236   LEUKOCYTESUR NEGATIVE 12/07/2021 1236   Sepsis Labs: No results found for this or any previous visit (from the  past 240 hour(s)).   Radiological Exams on Admission: No results found.  EKG: Independently reviewed.  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  Patient presents with progressively worsening epigastric abdominal pain with nausea and vomiting since recent EGD on 1/25.  Lipase was within normal limits, but CT imaging noted acute pancreatitis with interval improvement in prior pseudocyst.  GI have been formally consulted and noted symptoms could be secondary to pancreatitis and/or esophagitis. -Admit to medical telemetry   -Clear liquid diet and advance as tolerated -triglyceride level 35 -Normal saline IV fluids at 150 mL/h  -Continue Creon -Oxycodone/morphine IV as needed for pain -Antiemetics as needed nausea -Appreciate GI consultative services, we will follow-up for any further recommendations.  -Status post recent ERCP stent deferred secondary to candidal esophagitis -HIDA scan pending  Gallbladder distention: Acute.  CT scan of the abdomen pelvis noted dilated gallbladder with no visible gallstones and a small amount of pericholecystic fluid present.  Abdominal ultrasound noted distended gallbladder with  sludge present and  no signs of cholecystitis. -Continue to monitor -HIDA scan pending         Hypokalemia: Acute.  Potassium noted to be 3.2 on admission. -Give potassium chloride 30 mEq IV -Continue to monitor and replace as needed  Hyponatremia: Acute.  Sodium 130 on admission.  Suspect hypovolemic hyponatremia given.  Nausea and vomiting. -IV fluids as noted above -Recheck sodium levels in a.m.  Diabetes mellitus type 1, uncontrolled: On admission glucose elevated up to 231 without signs of elevated anion gap.  Last available hemoglobin A1c was 10.1 on 09/14/2021.  Home regimen includes Lantus 12 units daily and NovoLog 4 units 3 times daily with meals. -Hypoglycemic protocols -On insulin regimen continue patient's  home insulin regimen -Adjust as needed  Transaminitis  hyperbilirubinemia: Initial labs revealed alkaline phosphatase 907, AST 155, ALT 298, and total bilirubin 1.3. -Repeat CMP in a.m.  Candidal esophagitis: Noted during recent EGD from 1/25.  Patient reports that he has been taking the fluconazole as prescribed. -Continue fluconazole  Gastritis: Noted on recent EGD.  H. pylori testing was negative. 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  Splenic vein thrombosis: Patient was noted to have splenic vein thrombosis with collaterals during hospitalization in 09/2021, but recommended not to be on anticoagulations as symptoms were thought to be chronic in nature after consulting Dr.  Bertis RuddyGorsuch of hematology oncology.  Likely discharge in the next 24 hours of HIDA scan negative Advance diet  Carmon Sahli A MD [facility]

## 2021-12-10 LAB — GLUCOSE, CAPILLARY
Glucose-Capillary: 167 mg/dL — ABNORMAL HIGH (ref 70–99)
Glucose-Capillary: 273 mg/dL — ABNORMAL HIGH (ref 70–99)

## 2021-12-10 MED ORDER — OXYCODONE HCL 10 MG PO TABS: 5.0000 mg | ORAL_TABLET | Freq: Four times a day (QID) | ORAL | 0 refills | Status: AC | PRN

## 2021-12-10 NOTE — Progress Notes (Signed)
DISCHARGE NOTE HOME Carlos [name] to be discharged Home per MD order. Discussed prescriptions and follow up appointments with the patient. Prescriptions given to patient; medication list explained in detail. Patient verbalized understanding.  Skin clean, dry and intact without evidence of skin break down, no evidence of skin tears noted. IV catheter discontinued intact. Site without signs and symptoms of complications. Dressing and pressure applied. Pt denies pain at the site currently. No complaints noted.  Patient free of lines, drains, and wounds.   An After Visit Summary (AVS) was printed and given to the patient. Patient escorted via wheelchair, and discharged home via private auto.  Katrina Stack, RN

## 2021-12-10 NOTE — Plan of Care (Signed)
  Problem: Education: Goal: Knowledge of General Education information will improve Description: Including pain rating scale, medication(s)/side effects and non-pharmacologic comfort measures Outcome: Progressing   Problem: Health Behavior/Discharge Planning: Goal: Ability to manage health-related needs will improve Outcome: Progressing   Problem: Pain Managment: Goal: General experience of comfort will improve Outcome: Progressing   

## 2021-12-10 NOTE — Plan of Care (Signed)
  Problem: Education: Goal: Knowledge of General Education information will improve Description: Including pain rating scale, medication(s)/side effects and non-pharmacologic comfort measures Outcome: Progressing   Problem: Clinical Measurements: Goal: Diagnostic test results will improve Outcome: Progressing   Problem: Coping: Goal: Level of anxiety will decrease Outcome: Progressing   Problem: Pain Managment: Goal: General experience of comfort will improve Outcome: Progressing   

## 2021-12-10 NOTE — TOC Transition Note (Signed)
Transition of Care [facility]) - CM/SW Discharge Note   Patient Details  Name: Carlos [name] MRN: [id number] Date of Birth: 05/24/1986  [facility]) CM/SW Contact:  Tom-Johnson, Hershal Coria, RN Phone Number: 12/10/2021, 11:10 AM   Clinical Narrative:     Patient is schedule for discharge today. No recommendations noted. Denies any needs. Girlfriend to transport at discharge. No further TOC needs noted.  Final next level of care: Home/Self Care Barriers to Discharge: Barriers Resolved   Patient Goals and CMS Choice Patient states their goals for this hospitalization and ongoing recovery are:: To return home CMS Medicare.gov Compare Post Acute Care list provided to:: Patient Choice offered to / list presented to : NA  Discharge Placement                       Discharge Plan and Services                DME Arranged: N/A DME Agency: NA       HH Arranged: NA HH Agency: NA        Social Determinants of Health (SDOH) Interventions     Readmission Risk Interventions Readmission Risk Prevention Plan 10/07/2020  Transportation Screening Complete  PCP or Specialist Appt within 5-7 Days Complete  Home Care Screening Complete  Medication Review (RN CM) Complete

## 2021-12-10 NOTE — Discharge Summary (Signed)
Physician Discharge Summary  Carlos [name] WUJ:[id number] DOB: 1986-08-22 DOA: 12/07/2021  PCP: [name], MD  Admit date: 12/07/2021 Discharge date: 12/10/2021    Discharge Diagnoses:  Principal Problem:   Pancreatitis Active Problems:   Hyponatremia   Transaminitis   Pancreatic pseudocyst   Epigastric abdominal pain   Hypokalemia   Diabetes mellitus type 1 ([place])   Candida esophagitis ([place])   Discharge Condition: Stable  Filed Weights   12/07/21 0538  Weight: 72.6 kg    History of present illness:  Carlos [name] is a 36 y.o. male with medical history significant of DM type I, alcoholic pancreatitis with complications of pseudocyst, and splenic vein thrombosis who presents with complaints of worsening epigastric abdominal pain.  He was first diagnosed with pancreatitis back in 09/2020 that was thought related to alcohol, but since that time patient reports that he is not drank any alcohol.  He had been admitted into the hospital in 09/2021 after having syncopal episode with new onset DKA and recurrence of pancreatitis.  His started on insulin at that time, but reports he is continue to have abdominal pain.  He had  undergone EGD/EUS for pancreatic pseudocyst on 12/02/2021 by Dr. Rush Landmark which showed grade D erosive esophagitis with Candida present, H. pylori negative gastritis, and hiatal hernia.  No stenting was done during the procedure.  He was prescribed medications and so to pick up everything except for the oxycodone.  He reports taking the medications as prescribed, but noted worsening abdominal pain that he currently rates 8 out of 10 on the pain scale.  Associated symptoms include nausea and vomiting.  He was able to keep some food and liquids down intermittently.  Denies any in fever, chest pain, shortness of breath, or dysuria symptoms.  Patient does admit to smoking Black and mild daily on average.   ED Course: Upon admission into the emergency department patient was noted to  be afebrile, blood pressure 138/104-160/113, and all other vital signs maintained.  Labs significant for elevated MCV/MCH, sodium 130, potassium 3.2, chloride 94, glucose 231, anion gap 12, alkaline phosphatase 907, lipase 35, AST 155, ALT 298, and total bilirubin 1.3. CT scan of the abdomen pelvis noted interval changes acute pancreatitis with interval decrease in size of pancreatic tail pseudocyst, resolution of other previously demonstrated pseudocysts, gallbladder distention, and mild pericholecystic fluid thought to be related to changes of acute pancreatitis.  [facility] have been formally consulted.  Patient having given 1 L normal saline IV fluids, morphine, Pepcid, and GI cocktail.  [facility] called to admit  Hospital Course:  Pancreatitis with pancreatic pseudocyst:Acute on chronic    .  Patient presents with progressively worsening epigastric abdominal pain with nausea and vomiting since recent EGD on 1/25.  Lipase was within normal limits, but CT imaging noted acute pancreatitis with interval improvement in prior pseudocyst.  GI have been formally consulted and noted symptoms could be secondary to pancreatitis and/or esophagitis.  HIDA scan done EF 13% but doubt because of recurrent pancreatitis            -Tolerating full soft diet at discharge -triglyceride level 35 -Continue Creon -Oxycodone 5-day supply given at discharge -Appreciate GI consultative services, recommending outpatient surgical evaluation for possible cholecystectomy although not convinced gallbladder has any role in pancreatitis episodes -Status post recent ERCP stent deferred secondary to candidal esophagitis -Treat with a full course of Diflucan -Follow-up with a GI specialist as outpatient   Gallbladder distention: Acute.  CT scan of the  abdomen pelvis noted dilated gallbladder with no visible gallstones and a small amount of pericholecystic fluid present.  Abdominal ultrasound noted distended gallbladder with  sludge present  and no signs of cholecystitis. -Continue to monitor -HIDA scan low EF, possible surgery evaluation as an outpatient                                                                                      Hypokalemia: Acute.  Potassium noted to be 3.2 on admission. -Repleted  Hyponatremia: Acute.  Mildly low improved   diabetes mellitus type 1, uncontrolled: On admission glucose elevated up to 231 without signs of elevated anion gap.  Last available hemoglobin A1c was 10.1 on 09/14/2021.  Home regimen includes Lantus 12 units daily and NovoLog 4 units 3 times daily with meals. -Hypoglycemic protocols -On insulin regimen continue patient's home insulin regimen -Adjust as needed with PCP as outpatient   Transaminitis  hyperbilirubinemia: Initial labs revealed alkaline phosphatase 907, AST 155, ALT 298, and total bilirubin 1.3. -Trending down  Candidal esophagitis: Noted during recent EGD from 1/25.  Patient reports that he has been taking the fluconazole as prescribed. -Continue fluconazole   Gastritis: Noted on recent EGD.  H. pylori testing was negative. 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   Splenic vein thrombosis: Patient was noted to have splenic vein thrombosis with collaterals during hospitalization in 09/2021, but recommended not to be on anticoagulations as symptoms were thought to be chronic in nature after consulting Dr.  Alvy Bimler of hematology oncology.   Follow-up primary care physician 1 to 2 weeks and GI in 2 to 4 weeks  Given work excuse note for 1 week  Discharge Exam: Vitals:   12/10/21 0518 12/10/21 0913  BP: (!) 158/110 (!) 146/106  Pulse: 96 92  Resp: 18 18  Temp: 98.8 F (37.1 C) 99.4 F (37.4 C)  SpO2: 100% 100%    General: Alert and oriented x4 no pain distress Cardiovascular: Regular rate and rhythm without murmurs rubs gallops Respiratory: Clear to auscultation  bilateral no wheezes rhonchi or rales  Discharge Instructions   Discharge Instructions     Diet - low sodium heart healthy   Complete by: As directed    Discharge instructions   Complete by: As directed    Follow up with PCP 1-2 weeks Follow up with GI MD 2-4 weeks  Work excuse for a week   Increase activity slowly   Complete by: As directed       Allergies as of 12/10/2021       Reactions   Other Anaphylaxis   [name] syrup        Medication List     TAKE these medications    blood glucose meter kit and supplies Kit Dispense based on patient and insurance preference. Use up to four times daily as directed.   fluconazole 200 MG tablet Commonly known as: DIFLUCAN Take 2 tablets (400 mg total) by mouth daily for 1 day, THEN 1 tablet (200 mg total) daily for 13 days. Start taking on: December 03, 2021   insulin  aspart 100 UNIT/ML FlexPen Commonly known as: NOVOLOG Inject 4 Units into the skin 3 (three) times daily with meals.   insulin glargine 100 UNIT/ML Solostar Pen Commonly known as: LANTUS Inject 12 Units into the skin daily.   lipase/protease/amylase 36000 UNITS Cpep capsule Commonly known as: Creon Take 2 capsules (72,000 Units total) by mouth 3 (three) times daily with meals. May also take 1 capsule (36,000 Units total) as needed (with snacks). What changed: See the new instructions.   ondansetron 8 MG tablet Commonly known as: Zofran Take 1 tablet (8 mg total) by mouth every 8 (eight) hours as needed for nausea or vomiting.   Oxycodone HCl 10 MG Tabs Take 0.5-1 tablets (5-10 mg total) by mouth every 6 (six) hours as needed for up to 5 days. What changed: reasons to take this   Pen Needles 3/16" 31G X 5 MM Misc Use as directed with insulin pen   sucralfate 1 GM/10ML suspension Commonly known as: Carafate Take 10 mLs (1 g total) by mouth 2 (two) times daily.       Allergies  Allergen Reactions   Other Anaphylaxis    [name] syrup       The results of significant diagnostics from this hospitalization (including imaging, microbiology, ancillary and laboratory) are listed below for reference.    Significant Diagnostic Studies: CT ABDOMEN PELVIS W CONTRAST  Result Date: 12/07/2021 CLINICAL DATA:  Epigastric abdominal pain. EXAM: CT ABDOMEN AND PELVIS WITH CONTRAST TECHNIQUE: Multidetector CT imaging of the abdomen and pelvis was performed using the standard protocol following bolus administration of intravenous contrast. RADIATION DOSE REDUCTION: This exam was performed according to the departmental dose-optimization program which includes automated exposure control, adjustment of the mA and/or kV according to patient size and/or use of iterative reconstruction technique. CONTRAST:  3m OMNIPAQUE IOHEXOL 300 MG/ML  SOLN COMPARISON:  10/07/2021 FINDINGS: Lower chest: Unremarkable. Hepatobiliary: Mildly dilated gallbladder with no visible gallstones and no gallbladder wall thickening. Small amount of pericholecystic fluid. Unremarkable liver. An oval area of fat deposition is noted in the liver adjacent to the falciform ligament. Pancreas: Interval resolution and marked decrease in size of previously demonstrated large pancreatic and peripancreatic fluid collections. There is a residual collection in the distal pancreatic tail with mild thin internal septation measuring 3.7 x 2.4 cm on image number 45/3. This previously measured 9.6 x 8.3 cm. Multiple small pancreatic calcifications are again demonstrated. Diffuse peripancreatic edema. Spleen: Normal in size without focal abnormality. Adrenals/Urinary Tract: Adrenal glands are unremarkable. Kidneys are normal, without renal calculi, focal lesion, or hydronephrosis. Bladder is unremarkable. Stomach/Bowel: Unremarkable stomach, small bowel and colon. The appendix is not well visualized. No findings suspicious for appendicitis. Vascular/Lymphatic: No significant vascular findings are  present. No enlarged abdominal or pelvic lymph nodes. Normal opacification of the splenic vein is currently demonstrated. Reproductive: Prostate is unremarkable. Other: No abdominal wall hernia or abnormality. No abdominopelvic ascites. Musculoskeletal: Unremarkable bones. IMPRESSION: 1. Interval changes of acute pancreatitis. 2. Interval significant decrease in size of a pancreatic tail pseudocyst with resolution of other previously demonstrated pseudocysts. 3. Stable changes of chronic pancreatitis. 4. Gallbladder distension and mild pericholecystic fluid, most likely due to the adjacent changes of acute pancreatitis. Electronically Signed   By: SClaudie ReveringM.D.   On: 12/07/2021 08:56   NM Hepato W/EF  Result Date: 12/09/2021 CLINICAL DATA:  Patient was dosed iv with 5.3 mci 969m Choletec. Hx DM type I, alcoholic pancreatitis with complications of pseudocyst, and splenic vein thrombosis who  presents with complaints of worsening epigastric abdominal pain. EXAM: NUCLEAR MEDICINE HEPATOBILIARY IMAGING WITH GALLBLADDER EF TECHNIQUE: Sequential images of the abdomen were obtained out to 60 minutes following intravenous administration of radiopharmaceutical. After oral ingestion of Ensure, gallbladder ejection fraction was determined. At 60 min, normal ejection fraction is greater than 33%. RADIOPHARMACEUTICALS:  5.3 mCi Tc-30m Choletec IV COMPARISON:  CT 12/07/2021 FINDINGS: Prompt uptake and biliary excretion of activity by the liver is seen. Gallbladder activity is visualized, consistent with patency of cystic duct. Biliary activity passes into small bowel, consistent with patent common bile duct. Fatty meal was provided to provoke contraction of the gallbladder. The gallbladder contracted only minimally. Calculated gallbladder ejection fraction is 12%. (Normal gallbladder ejection fraction with Ensure is greater than 33%.) IMPRESSION: 1. Low gallbladder ejection fraction. 2. Patent cystic duct and common bile  duct. Electronically Signed   By: SSuzy BouchardM.D.   On: 12/09/2021 15:41   [place]domen Limited RUQ (LIVER/GB)  Result Date: 12/07/2021 CLINICAL DATA:  Right upper quadrant pain for 1 year. EXAM: ULTRASOUND ABDOMEN LIMITED RIGHT UPPER QUADRANT COMPARISON:  CT abdomen and pelvis 12/07/2021. Right upper quadrant ultrasound 09/26/2020. FINDINGS: Gallbladder: Moderately distended gallbladder containing a small amount of sludge. Possible 4 mm polyp or sludge ball for which no follow-up imaging is recommended. No shadowing gallstones or gallbladder wall thickening. No sonographic Murphy sign noted by sonographer. Common bile duct: Diameter: 6 mm Liver: No focal lesion identified. Within normal limits in parenchymal echogenicity. Portal vein is patent on color Doppler imaging with normal direction of blood flow towards the liver. Other: None. IMPRESSION: 1. Distended gallbladder containing sludge. No gallstones or evidence of cholecystitis. 2. No biliary dilatation. Electronically Signed   By: ALogan BoresM.D.   On: 12/07/2021 13:40    Microbiology: No results found for this or any previous visit (from the past 240 hour(s)).   Labs: Basic Metabolic Panel: Recent Labs  Lab 12/07/21 0549 12/08/21 0427 12/09/21 0821  NA 130* 136 135  K 3.2* 3.9 4.0  CL 94* 102 98  CO2 _0 GLUCOSE 231* 87 192*  BUN 6 <5* <5*  CREATININE 0.81 0.72 0.73  CALCIUM 9.0 8.9 9.0  MG 1.6*  --   --    Liver Function Tests: Recent Labs  Lab 12/07/21 0549 12/08/21 0427 12/09/21 0821  AST 155* 93* 184*  ALT 298* 176* 157*  ALKPHOS 907* 663* 970*  BILITOT 1.3* 1.0 1.0  PROT 6.9 5.9* 6.0*  ALBUMIN 3.5 3.1* 3.1*   Recent Labs  Lab 12/07/21 0549 12/09/21 0821  LIPASE 35 45   No results for input(s): AMMONIA in the last 168 hours. CBC: Recent Labs  Lab 12/07/21 0549 12/08/21 0427 12/09/21 0821  WBC 5.1 7.1 6.0  NEUTROABS  --   --  4.3  HGB 13.0 10.9* 10.9*  HCT 37.5* 31.2* 32.6*  MCV 105.9*  103.7* 104.8*  PLT 400 336 371   Cardiac Enzymes: No results for input(s): CKTOTAL, CKMB, CKMBINDEX, TROPONINI in the last 168 hours. BNP: BNP (last 3 results) No results for input(s): BNP in the last 8760 hours.  ProBNP (last 3 results) No results for input(s): PROBNP in the last 8760 hours.  CBG: Recent Labs  Lab 12/09/21 0725 12/09/21 1133 12/09/21 1622 12/10/21 0733 12/10/21 1127  GLUCAP 180* 143* 256* 167* 273*       Signed:  Jhana Giarratano A MD.  [facility] 12/10/2021, 2:26 PM

## 2021-12-14 ENCOUNTER — Telehealth: Payer: Managed Care, Other (non HMO)

## 2021-12-20 ENCOUNTER — Observation Stay (HOSPITAL_COMMUNITY)
Admission: EM | Admit: 2021-12-20 | Discharge: 2021-12-22 | Disposition: A | Discharge status: Home / Self Care | Payer: Medicaid Other | Attending: Internal Medicine | Admitting: Internal Medicine

## 2021-12-20 DIAGNOSIS — Z20822 Contact with and (suspected) exposure to covid-19: Secondary | ICD-10-CM | POA: Diagnosis not present

## 2021-12-20 DIAGNOSIS — G8929 Other chronic pain: Secondary | ICD-10-CM | POA: Diagnosis present

## 2021-12-20 DIAGNOSIS — E860 Dehydration: Secondary | ICD-10-CM | POA: Diagnosis not present

## 2021-12-20 DIAGNOSIS — E1065 Type 1 diabetes mellitus with hyperglycemia: Principal | ICD-10-CM | POA: Diagnosis present

## 2021-12-20 DIAGNOSIS — F17290 Nicotine dependence, other tobacco product, uncomplicated: Secondary | ICD-10-CM | POA: Diagnosis not present

## 2021-12-20 DIAGNOSIS — Z794 Long term (current) use of insulin: Secondary | ICD-10-CM | POA: Diagnosis not present

## 2021-12-20 DIAGNOSIS — E109 Type 1 diabetes mellitus without complications: Secondary | ICD-10-CM

## 2021-12-20 DIAGNOSIS — N179 Acute kidney failure, unspecified: Secondary | ICD-10-CM | POA: Diagnosis present

## 2021-12-20 DIAGNOSIS — R1013 Epigastric pain: Secondary | ICD-10-CM | POA: Diagnosis present

## 2021-12-20 DIAGNOSIS — R112 Nausea with vomiting, unspecified: Secondary | ICD-10-CM | POA: Diagnosis present

## 2021-12-20 DIAGNOSIS — R739 Hyperglycemia, unspecified: Secondary | ICD-10-CM

## 2021-12-20 DIAGNOSIS — E1101 Type 2 diabetes mellitus with hyperosmolarity with coma: Secondary | ICD-10-CM | POA: Diagnosis present

## 2021-12-21 ENCOUNTER — Encounter (HOSPITAL_COMMUNITY): Payer: Self-pay

## 2021-12-21 ENCOUNTER — Other Ambulatory Visit: Payer: Self-pay

## 2021-12-21 ENCOUNTER — Observation Stay (HOSPITAL_COMMUNITY): Payer: Medicaid Other

## 2021-12-21 DIAGNOSIS — N179 Acute kidney failure, unspecified: Secondary | ICD-10-CM

## 2021-12-21 DIAGNOSIS — E1101 Type 2 diabetes mellitus with hyperosmolarity with coma: Secondary | ICD-10-CM | POA: Diagnosis present

## 2021-12-21 DIAGNOSIS — E860 Dehydration: Secondary | ICD-10-CM | POA: Diagnosis not present

## 2021-12-21 DIAGNOSIS — E1069 Type 1 diabetes mellitus with other specified complication: Secondary | ICD-10-CM

## 2021-12-21 DIAGNOSIS — R112 Nausea with vomiting, unspecified: Secondary | ICD-10-CM | POA: Diagnosis present

## 2021-12-21 DIAGNOSIS — R1013 Epigastric pain: Secondary | ICD-10-CM

## 2021-12-21 HISTORY — DX: Acute kidney failure, unspecified: N17.9

## 2021-12-21 LAB — CBG MONITORING, ED
Glucose-Capillary: >600 mg/dL (ref 70–99)
Glucose-Capillary: 584 mg/dL (ref 70–99)
Glucose-Capillary: >600 mg/dL (ref 70–99)
Glucose-Capillary: 476 mg/dL — ABNORMAL HIGH (ref 70–99)
Glucose-Capillary: 274 mg/dL — ABNORMAL HIGH (ref 70–99)
Glucose-Capillary: 337 mg/dL — ABNORMAL HIGH (ref 70–99)
Glucose-Capillary: 190 mg/dL — ABNORMAL HIGH (ref 70–99)
Glucose-Capillary: 183 mg/dL — ABNORMAL HIGH (ref 70–99)
Glucose-Capillary: 147 mg/dL — ABNORMAL HIGH (ref 70–99)
Glucose-Capillary: 183 mg/dL — ABNORMAL HIGH (ref 70–99)
Glucose-Capillary: 207 mg/dL — ABNORMAL HIGH (ref 70–99)
Glucose-Capillary: 150 mg/dL — ABNORMAL HIGH (ref 70–99)
Glucose-Capillary: 149 mg/dL — ABNORMAL HIGH (ref 70–99)
Glucose-Capillary: 252 mg/dL — ABNORMAL HIGH (ref 70–99)

## 2021-12-21 LAB — COMPREHENSIVE METABOLIC PANEL
ALT: 54 U/L — ABNORMAL HIGH (ref 0–44)
ALT: 55 U/L — ABNORMAL HIGH (ref 0–44)
AST: 20 U/L (ref 15–41)
AST: 18 U/L (ref 15–41)
Albumin: 4.3 g/dL (ref 3.5–5.0)
Albumin: 4.5 g/dL (ref 3.5–5.0)
Alkaline Phosphatase: 640 U/L — ABNORMAL HIGH (ref 38–126)
Alkaline Phosphatase: 672 U/L — ABNORMAL HIGH (ref 38–126)
Anion gap: 16 — ABNORMAL HIGH (ref 5–15)
Anion gap: 16 — ABNORMAL HIGH (ref 5–15)
BUN: 17 mg/dL (ref 6–20)
BUN: 20 mg/dL (ref 6–20)
CO2: 24 mmol/L (ref 22–32)
CO2: 27 mmol/L (ref 22–32)
Calcium: 9.9 mg/dL (ref 8.9–10.3)
Calcium: 10.5 mg/dL — ABNORMAL HIGH (ref 8.9–10.3)
Chloride: 76 mmol/L — ABNORMAL LOW (ref 98–111)
Chloride: 82 mmol/L — ABNORMAL LOW (ref 98–111)
Creatinine, Ser: 1.2 mg/dL (ref 0.61–1.24)
Creatinine, Ser: 1.17 mg/dL (ref 0.61–1.24)
GFR, Estimated: >60 mL/min (ref >60)
GFR, Estimated: >60 mL/min (ref >60)
Glucose, Bld: 1159 mg/dL (ref 70–99)
Glucose, Bld: 776 mg/dL (ref 70–99)
Potassium: 5.3 mmol/L — ABNORMAL HIGH (ref 3.5–5.1)
Potassium: 4.3 mmol/L (ref 3.5–5.1)
Sodium: 116 mmol/L — CL (ref 135–145)
Sodium: 125 mmol/L — ABNORMAL LOW (ref 135–145)
Total Bilirubin: 0.6 mg/dL (ref 0.3–1.2)
Total Bilirubin: 0.6 mg/dL (ref 0.3–1.2)
Total Protein: 8.5 g/dL — ABNORMAL HIGH (ref 6.5–8.1)
Total Protein: 8.9 g/dL — ABNORMAL HIGH (ref 6.5–8.1)

## 2021-12-21 LAB — CBC WITH DIFFERENTIAL/PLATELET
Abs Immature Granulocytes: 0.02 10*3/uL (ref 0.00–0.07)
Abs Immature Granulocytes: 0.02 10*3/uL (ref 0.00–0.07)
Basophils Absolute: 0.1 10*3/uL (ref 0.0–0.1)
Basophils Absolute: 0.1 10*3/uL (ref 0.0–0.1)
Basophils Relative: 1 %
Basophils Relative: 1 %
Eosinophils Absolute: 0.1 10*3/uL (ref 0.0–0.5)
Eosinophils Absolute: 0.2 10*3/uL (ref 0.0–0.5)
Eosinophils Relative: 1 %
Eosinophils Relative: 2 %
HCT: 38.3 % — ABNORMAL LOW (ref 39.0–52.0)
HCT: 40.8 % (ref 39.0–52.0)
Hemoglobin: 13.2 g/dL (ref 13.0–17.0)
Hemoglobin: 14.4 g/dL (ref 13.0–17.0)
Immature Granulocytes: 0 %
Immature Granulocytes: 0 %
Lymphocytes Relative: 11 %
Lymphocytes Relative: 12 %
Lymphs Abs: 1 10*3/uL (ref 0.7–4.0)
Lymphs Abs: 0.9 10*3/uL (ref 0.7–4.0)
MCH: 35.4 pg — ABNORMAL HIGH (ref 26.0–34.0)
MCH: 35 pg — ABNORMAL HIGH (ref 26.0–34.0)
MCHC: 34.5 g/dL (ref 30.0–36.0)
MCHC: 35.3 g/dL (ref 30.0–36.0)
MCV: 102.7 fL — ABNORMAL HIGH (ref 80.0–100.0)
MCV: 99 fL (ref 80.0–100.0)
Monocytes Absolute: 0.7 10*3/uL (ref 0.1–1.0)
Monocytes Absolute: 0.3 10*3/uL (ref 0.1–1.0)
Monocytes Relative: 8 %
Monocytes Relative: 4 %
Neutro Abs: 6.8 10*3/uL (ref 1.7–7.7)
Neutro Abs: 6.6 10*3/uL (ref 1.7–7.7)
Neutrophils Relative %: 79 %
Neutrophils Relative %: 81 %
Platelets: 453 10*3/uL — ABNORMAL HIGH (ref 150–400)
Platelets: 489 10*3/uL — ABNORMAL HIGH (ref 150–400)
RBC: 3.73 MIL/uL — ABNORMAL LOW (ref 4.22–5.81)
RBC: 4.12 MIL/uL — ABNORMAL LOW (ref 4.22–5.81)
RDW: 11.8 % (ref 11.5–15.5)
RDW: 11.5 % (ref 11.5–15.5)
WBC: 8.7 10*3/uL (ref 4.0–10.5)
WBC: 8.2 10*3/uL (ref 4.0–10.5)
nRBC: 0 % (ref 0.0–0.2)
nRBC: 0 % (ref 0.0–0.2)

## 2021-12-21 LAB — MAGNESIUM: Magnesium: 3 mg/dL — ABNORMAL HIGH (ref 1.7–2.4)

## 2021-12-21 LAB — PHOSPHORUS: Phosphorus: 5.8 mg/dL — ABNORMAL HIGH (ref 2.5–4.6)

## 2021-12-21 LAB — URINALYSIS, ROUTINE W REFLEX MICROSCOPIC
Bacteria, UA: NONE SEEN
Bilirubin Urine: NEGATIVE
Glucose, UA: >=500 mg/dL — AB
Hgb urine dipstick: NEGATIVE
Ketones, ur: 5 mg/dL — AB
Leukocytes,Ua: NEGATIVE
Nitrite: NEGATIVE
Protein, ur: NEGATIVE mg/dL
Specific Gravity, Urine: 1.027 (ref 1.005–1.030)
pH: 5 (ref 5.0–8.0)

## 2021-12-21 LAB — RESP PANEL BY RT-PCR (FLU A&B, COVID) ARPGX2
Influenza A by PCR: NEGATIVE
Influenza B by PCR: NEGATIVE
SARS Coronavirus 2 by RT PCR: NEGATIVE

## 2021-12-21 LAB — BLOOD GAS, VENOUS
Acid-Base Excess: 2.3 mmol/L — ABNORMAL HIGH (ref 0.0–2.0)
Bicarbonate: 28.8 mmol/L — ABNORMAL HIGH (ref 20.0–28.0)
O2 Saturation: 48.9 %
Patient temperature: 98.6
pCO2, Ven: 54.6 mmHg (ref 44.0–60.0)
pH, Ven: 7.342 (ref 7.250–7.430)
pO2, Ven: 31.8 mmHg — CL (ref 32.0–45.0)

## 2021-12-21 LAB — BASIC METABOLIC PANEL
Anion gap: 11 (ref 5–15)
BUN: 22 mg/dL — ABNORMAL HIGH (ref 6–20)
CO2: 30 mmol/L (ref 22–32)
Calcium: 10.9 mg/dL — ABNORMAL HIGH (ref 8.9–10.3)
Chloride: 91 mmol/L — ABNORMAL LOW (ref 98–111)
Creatinine, Ser: 1.65 mg/dL — ABNORMAL HIGH (ref 0.61–1.24)
GFR, Estimated: 55 mL/min — ABNORMAL LOW (ref >60)
Glucose, Bld: 163 mg/dL — ABNORMAL HIGH (ref 70–99)
Potassium: 3.7 mmol/L (ref 3.5–5.1)
Sodium: 132 mmol/L — ABNORMAL LOW (ref 135–145)

## 2021-12-21 LAB — RAPID URINE DRUG SCREEN, HOSP PERFORMED
Amphetamines: NOT DETECTED
Barbiturates: NOT DETECTED
Benzodiazepines: NOT DETECTED
Cocaine: NOT DETECTED
Opiates: NOT DETECTED
Tetrahydrocannabinol: NOT DETECTED

## 2021-12-21 LAB — SODIUM, URINE, RANDOM: Sodium, Ur: <10 mmol/L

## 2021-12-21 LAB — OSMOLALITY: Osmolality: 297 mOsm/kg — ABNORMAL HIGH (ref 275–295)

## 2021-12-21 LAB — CREATININE, URINE, RANDOM: Creatinine, Urine: 21.76 mg/dL

## 2021-12-21 LAB — LIPASE, BLOOD: Lipase: 114 U/L — ABNORMAL HIGH (ref 11–51)

## 2021-12-21 LAB — BETA-HYDROXYBUTYRIC ACID: Beta-Hydroxybutyric Acid: 2.09 mmol/L — ABNORMAL HIGH (ref 0.05–0.27)

## 2021-12-21 LAB — ETHANOL: Alcohol, Ethyl (B): <10 mg/dL (ref <10)

## 2021-12-21 LAB — GLUCOSE, CAPILLARY
Glucose-Capillary: 404 mg/dL — ABNORMAL HIGH (ref 70–99)
Glucose-Capillary: 270 mg/dL — ABNORMAL HIGH (ref 70–99)

## 2021-12-21 MED ORDER — PANCRELIPASE (LIP-PROT-AMYL) 12000-38000 UNITS PO CPEP: 36000.0000 [IU] | ORAL_CAPSULE | Freq: Three times a day (TID) | ORAL | Status: DC | PRN

## 2021-12-21 MED ORDER — HYDROMORPHONE HCL 1 MG/ML IJ SOLN
0.5000 mg | INTRAMUSCULAR | Status: DC | PRN
  Administered 2021-12-21 – 2021-12-22 (×6): 0.5 mg via INTRAVENOUS
  Filled 2021-12-21: qty 0.5
  Filled 2021-12-21: qty 1
  Filled 2021-12-21 (×3): qty 0.5
  Filled 2021-12-21: qty 1

## 2021-12-21 MED ORDER — INSULIN ASPART 100 UNIT/ML IJ SOLN
9.0000 [IU] | Freq: Once | INTRAMUSCULAR | Status: AC
  Administered 2021-12-21: 9 [IU] via SUBCUTANEOUS

## 2021-12-21 MED ORDER — LACTATED RINGERS IV SOLN: INTRAVENOUS | Status: DC

## 2021-12-21 MED ORDER — PANCRELIPASE (LIP-PROT-AMYL) 36000-114000 UNITS PO CPEP
720000.0000 [IU] | ORAL_CAPSULE | Freq: Three times a day (TID) | ORAL | Status: DC
  Filled 2021-12-21: qty 20

## 2021-12-21 MED ORDER — ACETAMINOPHEN 650 MG RE SUPP: 650.0000 mg | Freq: Four times a day (QID) | RECTAL | Status: DC | PRN

## 2021-12-21 MED ORDER — FENTANYL CITRATE PF 50 MCG/ML IJ SOSY
50.0000 ug | PREFILLED_SYRINGE | Freq: Once | INTRAMUSCULAR | Status: AC
  Administered 2021-12-21: 50 ug via INTRAVENOUS
  Filled 2021-12-21: qty 1

## 2021-12-21 MED ORDER — INSULIN ASPART 100 UNIT/ML IJ SOLN
0.0000 [IU] | Freq: Three times a day (TID) | INTRAMUSCULAR | Status: DC
  Administered 2021-12-22: 3 [IU] via SUBCUTANEOUS
  Administered 2021-12-22: 5 [IU] via SUBCUTANEOUS
  Filled 2021-12-21: qty 0.09

## 2021-12-21 MED ORDER — ONDANSETRON HCL 4 MG/2ML IJ SOLN
4.0000 mg | Freq: Four times a day (QID) | INTRAMUSCULAR | Status: DC | PRN
  Administered 2021-12-21: 4 mg via INTRAVENOUS
  Filled 2021-12-21: qty 2

## 2021-12-21 MED ORDER — GLUCERNA SHAKE PO LIQD
237.0000 mL | Freq: Two times a day (BID) | ORAL | Status: DC
  Administered 2021-12-22: 237 mL via ORAL
  Filled 2021-12-21 (×2): qty 237

## 2021-12-21 MED ORDER — INSULIN REGULAR(HUMAN) IN NACL 100-0.9 UT/100ML-% IV SOLN
INTRAVENOUS | Status: DC
  Administered 2021-12-21: 11 [IU]/h via INTRAVENOUS
  Filled 2021-12-21: qty 100

## 2021-12-21 MED ORDER — SODIUM CHLORIDE 0.9 % IV BOLUS
20.0000 mL/kg | Freq: Once | INTRAVENOUS | Status: AC
  Administered 2021-12-21: 1102 mL via INTRAVENOUS

## 2021-12-21 MED ORDER — DEXTROSE IN LACTATED RINGERS 5 % IV SOLN: INTRAVENOUS | Status: DC

## 2021-12-21 MED ORDER — ENSURE ENLIVE PO LIQD: 237.0000 mL | Freq: Two times a day (BID) | ORAL | Status: DC

## 2021-12-21 MED ORDER — SODIUM CHLORIDE 0.45 % IV SOLN
INTRAVENOUS | Status: DC
  Filled 2021-12-21: qty 1000

## 2021-12-21 MED ORDER — INSULIN GLARGINE-YFGN 100 UNIT/ML ~~LOC~~ SOLN
12.0000 [IU] | Freq: Every day | SUBCUTANEOUS | Status: DC
  Administered 2021-12-21: 12 [IU] via SUBCUTANEOUS
  Filled 2021-12-21 (×2): qty 0.12

## 2021-12-21 MED ORDER — NALOXONE HCL 0.4 MG/ML IJ SOLN: 0.4000 mg | INTRAMUSCULAR | Status: DC | PRN

## 2021-12-21 MED ORDER — ACETAMINOPHEN 325 MG PO TABS: 650.0000 mg | ORAL_TABLET | Freq: Four times a day (QID) | ORAL | Status: DC | PRN

## 2021-12-21 MED ORDER — DEXTROSE 50 % IV SOLN: 0.0000 mL | INTRAVENOUS | Status: DC | PRN

## 2021-12-21 MED ORDER — PANCRELIPASE (LIP-PROT-AMYL) 12000-38000 UNITS PO CPEP
36000.0000 [IU] | ORAL_CAPSULE | Freq: Every day | ORAL | Status: DC
  Administered 2021-12-22: 36000 [IU] via ORAL
  Filled 2021-12-21: qty 3

## 2021-12-21 MED ORDER — KCL IN DEXTROSE-NACL 10-5-0.45 MEQ/L-%-% IV SOLN
INTRAVENOUS | Status: DC
  Administered 2021-12-21: 125 mL/h via INTRAVENOUS
  Filled 2021-12-21 (×2): qty 1000

## 2021-12-21 MED ORDER — PANCRELIPASE (LIP-PROT-AMYL) 12000-38000 UNITS PO CPEP
72000.0000 [IU] | ORAL_CAPSULE | Freq: Two times a day (BID) | ORAL | Status: DC
  Administered 2021-12-21 – 2021-12-22 (×2): 72000 [IU] via ORAL
  Filled 2021-12-21 (×2): qty 6

## 2021-12-21 NOTE — H&P (Signed)
History and Physical    PLEASE NOTE THAT DRAGON DICTATION SOFTWARE WAS USED IN THE CONSTRUCTION OF THIS NOTE.   Carlos [name] ACZ:[id number] DOB: 11/16/1985 DOA: 12/20/2021  PCP: [name], MD  Patient coming from: home   I have personally briefly reviewed patient's old medical records in [place]  Chief Complaint: Nausea vomiting  HPI: Carlos [name] is a 36 y.o. male with medical history significant for type 1 diabetes mellitus, chronic pancreatitis, who is admitted to [facility] on 12/20/2021 with suspected hyperosmolar hyperglycemic nonketotic state after presenting from home to [facility] ED complaining of nausea/vomiting.   The patient presents with 2 to 3 days of nausea resulting in at least 5-6 episodes of nonbloody, nonbilious emesis over that timeframe, and resulting in very limited oral intake of food or water over that duration.  Denies any associated subjective fever, chills, rigors, or generalized myalgias.  Denies any associated headache, neck stiffness shortness of breath, cough, diarrhea, dysuria, gross hematuria, or rash.  Also denies any recent chest pain palpitations, diaphoresis, dizziness, presyncope, or syncope.   He has a documented history of chronic pancreatitis, and was just hospitalized at [facility] at the end of January/beginning of February 2023 for further evaluation of presenting epigastric discomfort, including undergoing EGD as well as EUS via Dr. Rush Landmark of Gastroenterology, with these studies noted to have been performed on 12/02/2021.   Today the patient additional epigastric discomfort, sharp, nonradiating, and similar to that which she has been experiencing with his chronic pancreatitis.  No interval trauma.   Denies any recent consumption of alcohol, denies recreational drug use.  Reports good compliance with his home basal/short-acting insulin regimen, which consists of Lantus 20 units subcu daily as well as NovoLog 4 units 3 times daily with  meals.  Last day, he has noted progressive increase in his home blood sugars, noting that his glucometer began to read "high" today, prompting him to present to [facility] emergency department for further evaluation and management thereof     ED Course:  Vital signs in the ED were notable for the following: Afebrile; heart rate 101 02; blood pressure 115/91 - 130/84; respiratory rate 16, oxygen saturation 99 to 100% on room air.  Labs were notable for the following: Initial CBG greater than 600; VBG 7.342/54.6 beta hydroxybutyric acid 2.09.  CMP notable for the following: Sodium 116, which corrects approximate 132 when taking into account, hyperglycemia, initial potassium 5.3, with repeat BMP showing trend down to 4.3, initial bicarbonate 24, with repeat BMP demonstrating bicarbonate of 27, anion gap 16, creatinine 1.20 compared to most recent prior value of 0.73 on 12/09/2021, glucose 1100, albumin 4.3.  Presenting liver enzymes, in comparison to most recent prior set drawn on 12/09/2021 were notable for the following: Today's alkaline phosphatase 640 compared to 970 on 12/09/2021, AST 20 compared to 184, ALT 54 compared to 157, total bilirubin 0.6.  Lipase 114.  Urinalysis showed no blood cells, no bacteria, leukocyte Estrace/nitrate negative.  Specific gravity 1.027.  COVID-19/influenza PCR negative.  While in the ED, the following were administered: Insulin drip initiated; normal saline x1 L bolus followed by initiation of continuous lactated Ringer's at 125 cc/h, fentanyl 50 mcg IV x1.  Subsequently, the patient was admitted for observation for further evaluation management of suspected HHNK complicated by acute kidney injury and dehydration.     Review of Systems: As per HPI otherwise 10 point review of systems negative.   Past Medical History:  Diagnosis Date  Anxiety    Diabetes ([place])    Pancreatitis    Splenic vein thrombosis     Past Surgical History:  Procedure Laterality Date    BIOPSY  12/02/2021   Procedure: BIOPSY;  Surgeon: Rush Landmark Telford Nab., MD;  Location: [facility] ENDOSCOPY;  Service: Gastroenterology;;   ESOPHAGOGASTRODUODENOSCOPY (EGD) WITH PROPOFOL N/A 12/02/2021   Procedure: ESOPHAGOGASTRODUODENOSCOPY (EGD) WITH PROPOFOL;  Surgeon: Irving Copas., MD;  Location: [facility];  Service: Gastroenterology;  Laterality: N/A;   NO PAST SURGERIES     UPPER ESOPHAGEAL ENDOSCOPIC ULTRASOUND (EUS) N/A 12/02/2021   Procedure: UPPER ESOPHAGEAL ENDOSCOPIC ULTRASOUND (EUS);  Surgeon: Irving Copas., MD;  Location: [facility];  Service: Gastroenterology;  Laterality: N/A;    Social History:  reports that he has been smoking cigars. He started smoking about 19 years ago. He has never used smokeless tobacco. He reports that he does not currently use alcohol. He reports that he does not use drugs.   Allergies  Allergen Reactions   Other Anaphylaxis    [name] syrup    Family History  Problem Relation Age of Onset   Hypertension Mother    Alcoholism Maternal Uncle    Diabetes Maternal Grandmother    Diabetes Maternal Grandfather    Diabetes Paternal Grandmother    Diabetes Paternal Grandfather    Colon cancer Neg Hx    Esophageal cancer Neg Hx    Inflammatory bowel disease Neg Hx    Liver disease Neg Hx    Pancreatic cancer Neg Hx    Rectal cancer Neg Hx    Stomach cancer Neg Hx     Family history reviewed and not pertinent    Prior to Admission medications   Medication Sig Start Date End Date Taking? Authorizing Provider  blood glucose meter kit and supplies KIT Dispense based on patient and insurance preference. Use up to four times daily as directed. 09/16/21   Alma Friendly, MD  insulin aspart (NOVOLOG) 100 UNIT/ML FlexPen Inject 4 Units into the skin 3 (three) times daily with meals. 09/16/21   Alma Friendly, MD  insulin glargine (LANTUS) 100 UNIT/ML Solostar Pen Inject 12 Units into the skin daily. 09/16/21   Alma Friendly, MD  Insulin Pen Needle (PEN NEEDLES 3/16") 31G X 5 MM MISC Use as directed with insulin pen 09/16/21   Alma Friendly, MD  lipase/protease/amylase (CREON) 36000 UNITS CPEP capsule Take 2 capsules (72,000 Units total) by mouth 3 (three) times daily with meals. May also take 1 capsule (36,000 Units total) as needed (with snacks). Patient taking differently: Take 2 capsules (72000 units) by mouth daily with breakfast and supper, take 1 capsule (36000 units) with lunch/midday snack 08/31/21   [name], Telford Nab., MD  ondansetron (ZOFRAN) 8 MG tablet Take 1 tablet (8 mg total) by mouth every 8 (eight) hours as needed for nausea or vomiting. 12/02/21   [name], Telford Nab., MD  sucralfate (CARAFATE) 1 GM/10ML suspension Take 10 mLs (1 g total) by mouth 2 (two) times daily. 12/02/21 02/03/22  [name], Telford Nab., MD     Objective    Physical Exam: Vitals:   12/20/21 2358 12/21/21 0000 12/21/21 0200 12/21/21 0230  BP: 130/84  (!) 121/92 (!) 115/91  Pulse: 97  (!) 102 99  Resp: '16  12 11  ' Temp: 98.2 F (36.8 C)     TempSrc: Oral     SpO2: 100%  100% 99%  Weight:  55.1 kg    Height: '6\' 5"'  (1.956  m)       General: appears to be stated age; alert, oriented Skin: warm, dry, no rash Head:  AT/[place] Mouth:  Oral mucosa membranes appear dry, normal dentition Neck: supple; trachea midline Heart:  RRR; did not appreciate any M/R/G Lungs: CTAB, did not appreciate any wheezes, rales, or rhonchi Abdomen: + BS; soft, ND, tenderness over the epigastrium in the absence of any associated guarding, rigidity, or rebound tenderness Vascular: 2+ pedal pulses b/l; 2+ radial pulses b/l Extremities: no peripheral edema, no muscle wasting Neuro: strength and sensation intact in upper and lower extremities b/l    Labs on Admission: I have personally reviewed following labs and imaging studies  CBC: Recent Labs  Lab 12/21/21 0033 12/21/21 0342  WBC 8.7 8.2  NEUTROABS 6.8 6.6  HGB  13.2 14.4  HCT 38.3* 40.8  MCV 102.7* 99.0  PLT 453* 631*   Basic Metabolic Panel: Recent Labs  Lab 12/21/21 0033 12/21/21 0342  NA 116* 125*  K 5.3* 4.3  CL 76* 82*  CO2 24 27  GLUCOSE 1,159* 776*  BUN 17 20  CREATININE 1.20 1.17  CALCIUM 9.9 10.5*  MG  --  3.0*  PHOS  --  5.8*   GFR: Estimated Creatinine Clearance: 68 mL/min (by C-G formula based on SCr of 1.17 mg/dL). Liver Function Tests: Recent Labs  Lab 12/21/21 0033 12/21/21 0342  AST 20 18  ALT 54* 55*  ALKPHOS 640* 672*  BILITOT 0.6 0.6  PROT 8.5* 8.9*  ALBUMIN 4.3 4.5   Recent Labs  Lab 12/21/21 0033  LIPASE 114*   No results for input(s): AMMONIA in the last 168 hours. Coagulation Profile: No results for input(s): INR, PROTIME in the last 168 hours. Cardiac Enzymes: No results for input(s): CKTOTAL, CKMB, CKMBINDEX, TROPONINI in the last 168 hours. BNP (last 3 results) No results for input(s): PROBNP in the last 8760 hours. HbA1C: No results for input(s): HGBA1C in the last 72 hours. CBG: Recent Labs  Lab 12/21/21 0258 12/21/21 0337 12/21/21 0418 12/21/21 0504 12/21/21 0548  GLUCAP >600* >600* 584* 476* 337*   Lipid Profile: No results for input(s): CHOL, HDL, LDLCALC, TRIG, CHOLHDL, LDLDIRECT in the last 72 hours. Thyroid Function Tests: No results for input(s): TSH, T4TOTAL, FREET4, T3FREE, THYROIDAB in the last 72 hours. Anemia Panel: No results for input(s): VITAMINB12, FOLATE, FERRITIN, TIBC, IRON, RETICCTPCT in the last 72 hours. Urine analysis:    Component Value Date/Time   COLORURINE COLORLESS (A) 12/21/2021 0300   APPEARANCEUR CLEAR 12/21/2021 0300   LABSPEC 1.027 12/21/2021 0300   PHURINE 5.0 12/21/2021 0300   GLUCOSEU >=500 (A) 12/21/2021 0300   HGBUR NEGATIVE 12/21/2021 0300   BILIRUBINUR NEGATIVE 12/21/2021 0300   KETONESUR 5 (A) 12/21/2021 0300   PROTEINUR NEGATIVE 12/21/2021 0300   NITRITE NEGATIVE 12/21/2021 0300   LEUKOCYTESUR NEGATIVE 12/21/2021 0300     Radiological Exams on Admission: No results found.    Assessment/Plan    Principal Problem:   HHNC (hyperglycemic hyperosmolar nonketotic coma) ([place]) Active Problems:   Epigastric pain   Diabetes mellitus type 1 (HCC)   Nausea & vomiting   Dehydration   AKI (acute kidney injury) ([place])     #) Hyperglycemic hyperosmolar nonketotic state: In the setting of a history of type 1 diabetes, presenting blood sugar greater than 1100.  Consider the possibility of DKA in the setting of his history of type 1 diabetes mellitus, however, ensuing laboratory evaluation demonstrates a normal serum bicarbonate level while VBG is not  consistent with metabolic acidosis, while beta hydroxybutyric acid is only minimally elevated.  Also considered the possibility of developing DKA given very minimal elevation of anion gap at 16, however, repeat BMP shows further increase in interval serum bicarbonate level, rendering this possibility to appear less likely at this time.  Overall, given his laboratory values and trend, presentation appears more consistent with HHNK than DKA at this time.  Continue insulin drip initiated in the ED today.  This updated serum potassium level is now less than 5, will change existing IV fluids to half-normal saline with 10 meq/L Kcl @ 125cc/hr, with plan to add D5 to these IV fluids once CBG less than 250.   Unclear etiology in terms presenting hyperglycemia, as the patient reports good compliance with his home basal/short acting insulin regimen.  No overt evidence of underlying infectious process at this time, including negative COVID-19/influenza PCR as well as urinalysis that is inconsistent with UTI.  Will check chest x-ray to further evaluate.  Additionally, will check EKG and CXR to further evaluate at potential causative factors leading to [facility] presentation.    Plan:  Insulin drip per Endotool protocol. Q1H cbg's. Q4H BMP's ordered to ensure not ensuing development of anion  gap metabolic acidosis.  IV fluids, as above. Check serum Mg and Phos levels, w/ prn supplementation per protocol. holding home insulin for now. Monitor on telemetry. Monitor strict I's & O's.  Prn IV Zofran. Prn IV Dilaudid for abdominal pain. Check UDS.Marland Kitchen  Check CXR and EKG. add on serum ethanol level.        #) Epigastric pain: Appears more chronic/recurrent in the setting of chronic pancreatitis, with improving transaminitis.  No evidence of elevated bilirubin relative to most recent prior transaminases on 12/09/2021.  Additionally, physical exam reveals no evidence of acute peritoneal signs.  Was just hospitalized and underwent extensive work-up for his abdominal discomfort, including EGD/EUS, as further detailed above.  Consequently in the setting of recent extensive work-up for this discomfort improving transaminases, no evidence of cholestasis, I did not pursue additional imaging of the abdomen at this time.  Plan: Prn IV Dilaudid.  Repeat CMP in the morning.  As needed IV Zofran.  Further evaluation management of presenting HHNK, as above.        #) Acute kidney injury: Presenting serum creatinine 1.20 relative to most recent prior value 0.73 on 12/09/2021.  Appears prerenal in nature in the setting of dehydration, as evidenced by dry oral mucous membranes as well as specific gravity elevation seen on urinalysis, as a consequence of recent increase in GI losses in the form of nausea/vomiting as well as auto diuresis from glycemic gradient created by his presenting hyperglycemia.  Urinalysis demonstrates no evidence of urinary casts, as further detailed above.  Plan: Monitor strict I's and O's and daily weights.  Tempt avoid nephrotoxic agents.  IV fluids, as above.  Add on random urine sodium as well as random urine creatinine.       DVT prophylaxis: SCD's   Code Status: Full code Family Communication: none Disposition Plan: Per Rounding Team Consults called: none;  Admission  status: obs   PLEASE NOTE THAT DRAGON DICTATION SOFTWARE WAS USED IN THE CONSTRUCTION OF THIS NOTE.   [place] DO [facility] [place]   12/21/2021, 6:00 AM

## 2021-12-21 NOTE — Progress Notes (Signed)
Interval events noted.  He complains of upper abdominal pain.  No vomiting.  Glucose levels have improved.  Start insulin glargine and taper off insulin drip.  Continue IV fluids for AKI/dehydration.  Continue analgesics as needed for pain.  Continue Creon for chronic pancreatitis.

## 2021-12-21 NOTE — Progress Notes (Signed)
°   12/21/21 1517  Assess: MEWS Score  Temp 98.1 F (36.7 C)  BP (!) 134/107  Pulse Rate (!) 111  Resp 20  SpO2 100 %  Assess: MEWS Score  MEWS Temp 0  MEWS Systolic 0  MEWS Pulse 2  MEWS RR 0  MEWS LOC 0  MEWS Score 2  MEWS Score Color Yellow  Assess: if the MEWS score is Yellow or Red  Were vital signs taken at a resting state? Yes  Focused Assessment No change from prior assessment  Does the patient meet 2 or more of the SIRS criteria? No  Treat  MEWS Interventions Other (Comment)  Take Vital Signs  Increase Vital Sign Frequency  Yellow: Q 2hr X 2 then Q 4hr X 2, if remains yellow, continue Q 4hrs  Escalate  MEWS: Escalate Yellow: discuss with charge nurse/RN and consider discussing with provider and RRT  Notify: Charge Nurse/RN  Name of Charge Nurse/RN Notified Marissa RN  Date Charge Nurse/RN Notified 12/21/21  Time Charge Nurse/RN Notified 1520  Notify: Provider  Provider Name/Title Myriam Forehand MD  Date Provider Notified 12/21/21  Time Provider Notified 1721  Notification Type Page  Notification Reason Other (Comment) (yellow MEWS)  Provider response No new orders  Date of Provider Response 12/21/21  Document  Patient Outcome Stabilized after interventions  Progress note created (see row info) Yes  Assess: SIRS CRITERIA  SIRS Temperature  0  SIRS Pulse 1  SIRS Respirations  0  SIRS WBC 0  SIRS Score Sum  1    MD notified. Charge Nurse Marissa RN aware. Pt. Admission assessment is stable, pt. not in distress. Yellow MEWS monitoring activated. Will closely monitor patient.

## 2021-12-21 NOTE — Progress Notes (Addendum)
Date and time results received: 12/21/21 1720  Test: Blood glucose Critical Value: 404  Name of Provider Notified: Ayiku MD  Insulin order as per [facility].

## 2021-12-21 NOTE — ED Triage Notes (Signed)
Pt complains of dizziness, blurred vision, weight loss, abdominal pain, and vomiting x 2 weeks.

## 2021-12-21 NOTE — Progress Notes (Addendum)
Inpatient Diabetes Program Recommendations  AACE/ADA: New Consensus Statement on Inpatient Glycemic Control (2015)  Target Ranges:  Prepandial:   less than 140 mg/dL      Peak postprandial:   less than 180 mg/dL (1-2 hours)      Critically ill patients:  140 - 180 mg/dL   Lab Results  Component Value Date   GLUCAP 147 (H) 12/21/2021   HGBA1C 10.1 (H) 09/14/2021    Review of Glycemic Control  Latest Reference Range & Units 12/21/21 07:17 12/21/21 07:35 12/21/21 08:33  Glucose-Capillary 70 - 99 mg/dL 003 (H) 491 (H) 791 (H)  (H): Data is abnormally high Diabetes history: DM Outpatient Diabetes medications: Lantus 12 units QD, Novolog 4 units TID Current orders for Inpatient glycemic control: IV insulin transitioning to Semglee 12 units QD  Inpatient Diabetes Program Recommendations:    Consider: -adding A1C. -Novolog 0-9 units TID & HS (assuming diet to be placed) -Novolog 3 units TID (Assuming patient consuming >50% of meals).    Addendum: Spoke with patient regarding outpatient diabetes management. Verifies home medications, denies missing doses and reports rotating injection sites. No scar tissue present. Patient states he is continuing to lose weight; was new onset in 09/2021.  Reviewed patient's previous A1c of 10.1% and informed of needed updated result. Explained what a A1c is and what it measures. Also reviewed goal A1c with patient, importance of good glucose control @ home, and blood sugar goals. Reviewed patho of DM, need for insulin, role of pancreas, impact of pancreatitis, signs and symptoms of hypo vs hyper glycemia, HHNK vs DKA, vascular changes and commodities. Patient reports checking CBGs multiple times per day usually FSBG with meals, occasional post prandial with blood sugars being 70-180's mg/dL. When asked about serum glucose on admission, patient has no idea why it was so elevated. Denies missing doses. Answering questions appropriately regarding previous doses,  dosage amount and carb counts. Interested in using [employer].  Secure chat sent to Md regarding recs and use of Freestyle Libre. Order received. At discharge [facility] sensors # [phone] Admits to consuming CHO, however, reports compliance with covering CHO.  Education provided on role of endocrinology; outpatient list provided to DC summary.   @1430  Freestyle libre placed to patients right upper arm. Reviewed risks and benefits of sensor, how to apply device interpret and other tools to guide patient. Curious if patient could benefit from sliding scale in addition to home orders for insulin at discharge?  Thanks, , MSN, RNC-OB Diabetes Coordinator [phone] (8a-5p)

## 2021-12-21 NOTE — Plan of Care (Signed)
  Problem: Health Behavior/Discharge Planning: Goal: Ability to manage health-related needs will improve Outcome: Progressing   Problem: Clinical Measurements: Goal: Will remain free from infection Outcome: Progressing   Problem: Nutrition: Goal: Adequate nutrition will be maintained Outcome: Progressing   Problem: Coping: Goal: Level of anxiety will decrease Outcome: Progressing   Problem: Pain Managment: Goal: General experience of comfort will improve Outcome: Progressing   Problem: Safety: Goal: Ability to remain free from injury will improve Outcome: Progressing   

## 2021-12-21 NOTE — Discharge Instructions (Signed)
Local Endocrinologists [facility] ([phone]) Dr. Cristina Gherghe Dr. Ajay Kumar [facility] ([phone]) Dr. Jeffrey Kerr [facility] ([phone]) Dr. Bindubal Balan Dr. Walter Kohut [facility] ([phone]) Dr. Stephen [facility] ([phone]) [[place] office]  ([phone]) [[place] office] Dr. Melissa Solum Dr. Thomas O'Connell [facility] ([facility]) ([phone]) Autumn Hudnall ([place]), [place] Dr. Dhaval Patel Dr. William Smith Jr. [facility] ([phone]) Dr. Gebre Nida [facility] ([phone]) Dr. Micheal Brennan Dr. Jennifer Badik Dr. Ashley Jessup Spencer Beasley, FNP Dr. Monica E. Doerr in [place] ([phone]) 

## 2021-12-21 NOTE — ED Notes (Signed)
Called pharmacy to have maintenance fluids sent

## 2021-12-21 NOTE — ED Provider Notes (Signed)
[facility] DEPT Provider Note   CSN: [id number] Arrival date & time: 12/20/21  2352     History  Chief Complaint  Patient presents with   Abdominal Pain   Emesis   Dizziness   Blurred Vision   Weight Loss    Carlos [name] is a 36 y.o. male.  The history is provided by the patient and medical records.  Abdominal Pain Associated symptoms: vomiting   Emesis Associated symptoms: abdominal pain   Dizziness Associated symptoms: vomiting   Carlos [name] is a 36 y.o. male who presents to the Emergency Department complaining of abdominal pain, vomiting and blurred vision.  He states that 3 days ago he started feeling like his vision was blurry.  Yesterday he started having clear emesis.  He also complains of epigastric abdominal pain.  He has been experiencing this since last summer but is worse than his baseline.  His pain is consistent with his prior history of pancreatitis.  No associated fever.  He has experienced rapid weight loss over the last 2 months.  2 months ago he weighed 145 and today he weighs 121.  He has a history of type 1 diabetes and is compliant with his medications.  He takes NovoLog 4 units after meals as well as Basaglar 12u qhs.  He states that at home his blood sugars have been running 100s to 200s.       Home Medications Prior to Admission medications   Medication Sig Start Date End Date Taking? Authorizing Provider  blood glucose meter kit and supplies KIT Dispense based on patient and insurance preference. Use up to four times daily as directed. 09/16/21   Alma Friendly, MD  insulin aspart (NOVOLOG) 100 UNIT/ML FlexPen Inject 4 Units into the skin 3 (three) times daily with meals. 09/16/21   Alma Friendly, MD  insulin glargine (LANTUS) 100 UNIT/ML Solostar Pen Inject 12 Units into the skin daily. 09/16/21   Alma Friendly, MD  Insulin Pen Needle (PEN NEEDLES 3/16") 31G X 5 MM MISC Use as directed with insulin pen  09/16/21   Alma Friendly, MD  lipase/protease/amylase (CREON) 36000 UNITS CPEP capsule Take 2 capsules (72,000 Units total) by mouth 3 (three) times daily with meals. May also take 1 capsule (36,000 Units total) as needed (with snacks). Patient taking differently: Take 2 capsules (72000 units) by mouth daily with breakfast and supper, take 1 capsule (36000 units) with lunch/midday snack 08/31/21   [name], Telford Nab., MD  ondansetron (ZOFRAN) 8 MG tablet Take 1 tablet (8 mg total) by mouth every 8 (eight) hours as needed for nausea or vomiting. 12/02/21   [name], Telford Nab., MD  sucralfate (CARAFATE) 1 GM/10ML suspension Take 10 mLs (1 g total) by mouth 2 (two) times daily. 12/02/21 02/03/22  [name], Telford Nab., MD      Allergies    Other    Review of Systems   Review of Systems  Gastrointestinal:  Positive for abdominal pain and vomiting.  Neurological:  Positive for dizziness.  All other systems reviewed and are negative.  Physical Exam Updated Vital Signs BP (!) 115/91    Pulse 99    Temp 98.2 F (36.8 C) (Oral)    Resp 11    Ht '6\' 5"'  (1.956 m)    Wt 55.1 kg    SpO2 99%    BMI 14.40 kg/m  Physical Exam Vitals and nursing note reviewed.  Constitutional:      Appearance: He is  well-developed.  HENT:     Head: Normocephalic and atraumatic.  Cardiovascular:     Rate and Rhythm: Normal rate and regular rhythm.  Pulmonary:     Effort: Pulmonary effort is normal. No respiratory distress.  Abdominal:     Palpations: Abdomen is soft.     Tenderness: There is no guarding or rebound.     Comments: Mild generalized abdominal tenderness  Musculoskeletal:        General: No tenderness.  Skin:    General: Skin is warm and dry.  Neurological:     Mental Status: He is alert and oriented to person, place, and time.  Psychiatric:        Behavior: Behavior normal.    ED Results / Procedures / Treatments   Labs (all labs ordered are listed, but only abnormal results are  displayed) Labs Reviewed  COMPREHENSIVE METABOLIC PANEL - Abnormal; Notable for the following components:      Result Value   Sodium 116 (*)    Potassium 5.3 (*)    Chloride 76 (*)    Glucose, Bld 1,159 (*)    Total Protein 8.5 (*)    ALT 54 (*)    Alkaline Phosphatase 640 (*)    Anion gap 16 (*)    All other components within normal limits  LIPASE, BLOOD - Abnormal; Notable for the following components:   Lipase 114 (*)    All other components within normal limits  CBC WITH DIFFERENTIAL/PLATELET - Abnormal; Notable for the following components:   RBC 3.73 (*)    HCT 38.3 (*)    MCV 102.7 (*)    MCH 35.4 (*)    Platelets 453 (*)    All other components within normal limits  URINALYSIS, ROUTINE W REFLEX MICROSCOPIC - Abnormal; Notable for the following components:   Color, Urine COLORLESS (*)    Glucose, UA >=500 (*)    Ketones, ur 5 (*)    All other components within normal limits  BETA-HYDROXYBUTYRIC ACID - Abnormal; Notable for the following components:   Beta-Hydroxybutyric Acid 2.09 (*)    All other components within normal limits  BLOOD GAS, VENOUS - Abnormal; Notable for the following components:   pO2, Ven 31.8 (*)    Bicarbonate 28.8 (*)    Acid-Base Excess 2.3 (*)    All other components within normal limits  MAGNESIUM - Abnormal; Notable for the following components:   Magnesium 3.0 (*)    All other components within normal limits  COMPREHENSIVE METABOLIC PANEL - Abnormal; Notable for the following components:   Sodium 125 (*)    Chloride 82 (*)    Glucose, Bld 776 (*)    Calcium 10.5 (*)    Total Protein 8.9 (*)    ALT 55 (*)    Alkaline Phosphatase 672 (*)    Anion gap 16 (*)    All other components within normal limits  CBC WITH DIFFERENTIAL/PLATELET - Abnormal; Notable for the following components:   RBC 4.12 (*)    MCH 35.0 (*)    Platelets 489 (*)    All other components within normal limits  PHOSPHORUS - Abnormal; Notable for the following  components:   Phosphorus 5.8 (*)    All other components within normal limits  CBG MONITORING, ED - Abnormal; Notable for the following components:   Glucose-Capillary >600 (*)    All other components within normal limits  CBG MONITORING, ED - Abnormal; Notable for the following components:   Glucose-Capillary >600 (*)  All other components within normal limits  CBG MONITORING, ED - Abnormal; Notable for the following components:   Glucose-Capillary 584 (*)    All other components within normal limits  CBG MONITORING, ED - Abnormal; Notable for the following components:   Glucose-Capillary 476 (*)    All other components within normal limits  RESP PANEL BY RT-PCR (FLU A&B, COVID) [id number]  BASIC METABOLIC PANEL    EKG None  Radiology No results found.  Procedures Procedures   CRITICAL CARE Performed by: Quintella Reichert   Total critical care time: 35 minutes  Critical care time was exclusive of separately billable procedures and treating other patients.  Critical care was necessary to treat or prevent imminent or life-threatening deterioration.  Critical care was time spent personally by me on the following activities: development of treatment plan with patient and/or surrogate as well as nursing, discussions with consultants, evaluation of patient's response to treatment, examination of patient, obtaining history from patient or surrogate, ordering and performing treatments and interventions, ordering and review of laboratory studies, ordering and review of radiographic studies, pulse oximetry and re-evaluation of patient's condition.  Medications Ordered in ED Medications  insulin regular, human (MYXREDLIN) 100 units/ 100 mL infusion (9 Units/hr Intravenous Infusion Verify 12/21/21 0506)  lactated ringers infusion (has no administration in time range)  dextrose 5 % in lactated ringers infusion (has no administration in time range)  dextrose 50 % solution 0-50 mL (has no  administration in time range)  acetaminophen (TYLENOL) tablet 650 mg (has no administration in time range)    Or  acetaminophen (TYLENOL) suppository 650 mg (has no administration in time range)  HYDROmorphone (DILAUDID) injection 0.5 mg (has no administration in time range)  naloxone (NARCAN) injection 0.4 mg (has no administration in time range)  ondansetron (ZOFRAN) injection 4 mg (has no administration in time range)  sodium chloride 0.9 % bolus 1,102 mL (1,102 mLs Intravenous New Bag/Given 12/21/21 0305)  fentaNYL (SUBLIMAZE) injection 50 mcg (50 mcg Intravenous Given 12/21/21 0344)    ED Course/ Medical Decision Making/ A&P                           Medical Decision Making Amount and/or Complexity of Data Reviewed Labs: ordered.  Risk Prescription drug management. Decision regarding hospitalization.   Patient with history of type 1 diabetes, pancreatitis here for evaluation of abdominal pain, blurred vision, weight loss and vomiting.  He is dehydrated appearing on evaluation.  He does have abdominal tenderness without peritonitis.  Labs significant for profound hyperglycemia.  He is hyponatremic but on sodium correction for his hyperglycemia his sodium is only mildly decreased.  He does have an elevated anion gap with no evidence of acidosis on his BMP.  His lipase is mildly elevated but not as high as it has been previously.  Given his profound hyperglycemia he was treated with IV fluid boluses for dehydration as well as insulin drip for glycemic control.  Medicine consulted for admission for ongoing treatment.        Final Clinical Impression(s) / ED Diagnoses Final diagnoses:  Hyperglycemia    Rx / DC Orders ED Discharge Orders     None         Quintella Reichert, MD 12/21/21 [phone]

## 2021-12-22 ENCOUNTER — Telehealth: Payer: Self-pay | Admitting: *Deleted

## 2021-12-22 ENCOUNTER — Encounter: Payer: Self-pay | Admitting: Family

## 2021-12-22 ENCOUNTER — Ambulatory Visit (INDEPENDENT_AMBULATORY_CARE_PROVIDER_SITE_OTHER): Payer: Medicaid Other | Admitting: Family

## 2021-12-22 VITALS — BP 116/80 | HR 105 | Temp 97.7°F | Resp 16 | Ht 77.0 in | Wt 129.6 lb

## 2021-12-22 DIAGNOSIS — E1101 Type 2 diabetes mellitus with hyperosmolarity with coma: Secondary | ICD-10-CM | POA: Diagnosis not present

## 2021-12-22 DIAGNOSIS — G8929 Other chronic pain: Secondary | ICD-10-CM

## 2021-12-22 DIAGNOSIS — K860 Alcohol-induced chronic pancreatitis: Secondary | ICD-10-CM

## 2021-12-22 DIAGNOSIS — R112 Nausea with vomiting, unspecified: Secondary | ICD-10-CM

## 2021-12-22 DIAGNOSIS — E1010 Type 1 diabetes mellitus with ketoacidosis without coma: Secondary | ICD-10-CM

## 2021-12-22 DIAGNOSIS — R1013 Epigastric pain: Secondary | ICD-10-CM

## 2021-12-22 DIAGNOSIS — F1020 Alcohol dependence, uncomplicated: Secondary | ICD-10-CM

## 2021-12-22 DIAGNOSIS — H538 Other visual disturbances: Secondary | ICD-10-CM

## 2021-12-22 LAB — CBC WITH DIFFERENTIAL/PLATELET
Absolute Monocytes: 462 cells/uL (ref 200–950)
Basophils Absolute: 13 cells/uL (ref 0–200)
Basophils Relative: 0.2 %
Eosinophils Absolute: 191 cells/uL (ref 15–500)
Eosinophils Relative: 2.9 %
HCT: 36.9 % — ABNORMAL LOW (ref 38.5–50.0)
Hemoglobin: 12.5 g/dL — ABNORMAL LOW (ref 13.2–17.1)
Lymphs Abs: 997 cells/uL (ref 850–3900)
MCH: 34.4 pg — ABNORMAL HIGH (ref 27.0–33.0)
MCHC: 33.9 g/dL (ref 32.0–36.0)
MCV: 101.7 fL — ABNORMAL HIGH (ref 80.0–100.0)
MPV: 10.9 fL (ref 7.5–12.5)
Monocytes Relative: 7 %
Neutro Abs: 4937 cells/uL (ref 1500–7800)
Neutrophils Relative %: 74.8 %
Platelets: 362 10*3/uL (ref 140–400)
RBC: 3.63 10*6/uL — ABNORMAL LOW (ref 4.20–5.80)
RDW: 11.5 % (ref 11.0–15.0)
Total Lymphocyte: 15.1 %
WBC: 6.6 10*3/uL (ref 3.8–10.8)

## 2021-12-22 LAB — COMPLETE METABOLIC PANEL WITH GFR
AG Ratio: 1.2 (calc) (ref 1.0–2.5)
ALT: 33 U/L (ref 9–46)
AST: 19 U/L (ref 10–40)
Albumin: 3.7 g/dL (ref 3.6–5.1)
Alkaline phosphatase (APISO): 487 U/L — ABNORMAL HIGH (ref 36–130)
BUN: 18 mg/dL (ref 7–25)
CO2: 30 mmol/L (ref 20–32)
Calcium: 9.5 mg/dL (ref 8.6–10.3)
Chloride: 91 mmol/L — ABNORMAL LOW (ref 98–110)
Creat: 0.79 mg/dL (ref 0.60–1.26)
Globulin: 3.1 g/dL (calc) (ref 1.9–3.7)
Glucose, Bld: 230 mg/dL — ABNORMAL HIGH (ref 65–139)
Potassium: 3.9 mmol/L (ref 3.5–5.3)
Sodium: 130 mmol/L — ABNORMAL LOW (ref 135–146)
Total Bilirubin: 0.6 mg/dL (ref 0.2–1.2)
Total Protein: 6.8 g/dL (ref 6.1–8.1)
eGFR: 118 mL/min/{1.73_m2} (ref >=60)

## 2021-12-22 LAB — HEMOGLOBIN A1C
Hgb A1c MFr Bld: 9.2 % — ABNORMAL HIGH (ref 4.8–5.6)
Mean Plasma Glucose: 217 mg/dL

## 2021-12-22 LAB — GLUCOSE, CAPILLARY
Glucose-Capillary: 253 mg/dL — ABNORMAL HIGH (ref 70–99)
Glucose-Capillary: 248 mg/dL — ABNORMAL HIGH (ref 70–99)

## 2021-12-22 MED ORDER — HYDROMORPHONE HCL 4 MG PO TABS: 4.0000 mg | ORAL_TABLET | Freq: Four times a day (QID) | ORAL | 0 refills | Status: DC | PRN

## 2021-12-22 MED ORDER — GLUCERNA SHAKE PO LIQD: 237.0000 mL | Freq: Two times a day (BID) | ORAL | 0 refills | Status: AC

## 2021-12-22 MED ORDER — INSULIN GLARGINE-YFGN 100 UNIT/ML ~~LOC~~ SOLN
16.0000 [IU] | Freq: Every day | SUBCUTANEOUS | Status: DC
  Administered 2021-12-22: 16 [IU] via SUBCUTANEOUS
  Filled 2021-12-22: qty 0.16

## 2021-12-22 MED ORDER — FREESTYLE LIBRE 2 SENSOR MISC: 2 refills | Status: DC

## 2021-12-22 MED ORDER — INSULIN GLARGINE 100 UNIT/ML SOLOSTAR PEN: 16.0000 [IU] | PEN_INJECTOR | Freq: Every day | SUBCUTANEOUS | 0 refills | Status: DC

## 2021-12-22 MED ORDER — INSULIN ASPART 100 UNIT/ML FLEXPEN: 3.0000 [IU] | PEN_INJECTOR | Freq: Three times a day (TID) | SUBCUTANEOUS | 0 refills | Status: DC

## 2021-12-22 MED ORDER — INSULIN ASPART 100 UNIT/ML IJ SOLN
3.0000 [IU] | Freq: Three times a day (TID) | INTRAMUSCULAR | Status: DC
  Administered 2021-12-22 (×2): 3 [IU] via SUBCUTANEOUS

## 2021-12-22 NOTE — Progress Notes (Signed)
Telebox removed per instructions of Primary RN d/t patient being discharged.

## 2021-12-22 NOTE — Progress Notes (Addendum)
Provider: Ehren Berisha FNP-C  [name], MD  Patient Care Team: [name], MD as PCP - General (Family Medicine) Jodi Marble, MD as Consulting Physician (Otolaryngology)  Extended Emergency Contact Information Primary Emergency Contact: Fletcher Mobile Phone: [phone] Relation: Friend  Code Status:  Full Code  Goals of care: Advanced Directive information Advanced Directives 12/22/2021  Does Patient Have a Medical Advance Directive? No  Would patient like information on creating a medical advance directive? No - Patient declined     Chief Complaint  Patient presents with   Transitions Of Care    TOC 12/20/2021-12/22/2021.    HPI:  Pt is a 36 y.o. male seen today for an acute visit for transition of care post hospitalization at [facility] from 12/20/2021 - 12/22/2021 for hyperglycemia.CBG was > 1,159.Na+ 116,K+ 5.3,lipase 114.liver enzymes were also elevated ALT 54,AST 20 ,ALK 640.SARS test was negative.Of note,on chart review he has had multiple hospitalization on 12/02/2021;12/07/2021 and 12/20/2021.He has a medical history of chronic epigastric secondary to acute pancreatitis on creon,type 1 Diabetes Mellitus,Generalized Anxiety disorder,hx of Alcohol disorder among others.  His latest admission he had nonketotic state hyperglycemia after presenting to the ED with complains of nausea and vomiting.Unclear what caused the symptoms since he has been compliant with his home insulin regimen.Also had no signs of infection.He was treated with insulin drip and IV fluids with much improvement overnight.His insulin was adjusted.He was advised to follow up with PCP and referred to Lillie sensors and Kit provided for close glucose monitoring. He discharged on Lantus 16 units daily and Novolog 3 units three times daily  He states CBG readings down to 200's  States vision has been blurry. Epigastric pain was treated with oxycodone  but had elevated liver enzymes.He was treated with dilaudid.GI consulted on admission 12/07/2021.CT imaging showed acute pancreatitis with interval improvement in prior pseudocyst.GI thought symptoms possible due to pancreatitis and esophagitis.He had a HIDA scan done EF 13 %GI recommended outpatient surgical evaluation for possible cholecystomy since CT scan of Abdomen pelvis noted dilated gallbladder with no visible gallstones and small amount of pericholecystic fluid present Abd U/S indicated distended gallbladder with sludge present and no signs of cholecystitis. He continue to complain of abdominal pain rating 7-8 on scale at times.requesting refills on dilaudid unable to any Tylenol products or ibuprofen.     Past Medical History:  Diagnosis Date   AKI (acute kidney injury) ([place]) 12/21/2021   Anxiety    Diabetes ([place])    Pancreatitis    Splenic vein thrombosis    Past Surgical History:  Procedure Laterality Date   BIOPSY  12/02/2021   Procedure: BIOPSY;  Surgeon: Irving Copas., MD;  Location: [facility];  Service: Gastroenterology;;   ESOPHAGOGASTRODUODENOSCOPY (EGD) WITH PROPOFOL N/A 12/02/2021   Procedure: ESOPHAGOGASTRODUODENOSCOPY (EGD) WITH PROPOFOL;  Surgeon: Irving Copas., MD;  Location: [facility];  Service: Gastroenterology;  Laterality: N/A;   NO PAST SURGERIES     UPPER ESOPHAGEAL ENDOSCOPIC ULTRASOUND (EUS) N/A 12/02/2021   Procedure: UPPER ESOPHAGEAL ENDOSCOPIC ULTRASOUND (EUS);  Surgeon: Irving Copas., MD;  Location: [facility];  Service: Gastroenterology;  Laterality: N/A;    Allergies  Allergen Reactions   Other Anaphylaxis    [name] syrup    Outpatient Encounter Medications as of 12/22/2021  Medication Sig   blood glucose meter kit and supplies KIT Dispense based on patient and insurance preference. Use up to four times daily as directed.   Continuous Blood Gluc Sensor (FREESTYLE  LIBRE 2 SENSOR) MISC Use as directed.    feeding supplement, GLUCERNA SHAKE, (GLUCERNA SHAKE) LIQD Take 237 mLs by mouth 2 (two) times daily between meals.   insulin aspart (NOVOLOG) 100 UNIT/ML FlexPen Inject 3 Units into the skin 3 (three) times daily with meals.   insulin glargine (LANTUS) 100 UNIT/ML Solostar Pen Inject 16 Units into the skin daily.   Insulin Pen Needle (PEN NEEDLES 3/16") 31G X 5 MM MISC Use as directed with insulin pen   lipase/protease/amylase (CREON) 36000 UNITS CPEP capsule Take 2 capsules (72,000 Units total) by mouth 3 (three) times daily with meals. May also take 1 capsule (36,000 Units total) as needed (with snacks).   ondansetron (ZOFRAN) 8 MG tablet Take 1 tablet (8 mg total) by mouth every 8 (eight) hours as needed for nausea or vomiting.   [DISCONTINUED] ibuprofen (ADVIL) 200 MG tablet Take 200 mg by mouth every 6 (six) hours as needed for fever, headache, mild pain or moderate pain.   [DISCONTINUED] acetaminophen (TYLENOL) suppository 650 mg    [DISCONTINUED] acetaminophen (TYLENOL) tablet 650 mg    [DISCONTINUED] dextrose 50 % solution 0-50 mL    [DISCONTINUED] feeding supplement (GLUCERNA SHAKE) (GLUCERNA SHAKE) liquid 237 mL    [DISCONTINUED] HYDROmorphone (DILAUDID) injection 0.5 mg    [DISCONTINUED] insulin aspart (novoLOG) injection 0-9 Units    [DISCONTINUED] insulin aspart (novoLOG) injection 3 Units    [DISCONTINUED] insulin glargine-yfgn (SEMGLEE) injection 16 Units    [DISCONTINUED] lactated ringers infusion    [DISCONTINUED] lipase/protease/amylase (CREON) capsule 36,000 Units    [DISCONTINUED] lipase/protease/amylase (CREON) capsule 36,000 Units    [DISCONTINUED] lipase/protease/amylase (CREON) capsule 72,000 Units    [DISCONTINUED] naloxone (NARCAN) injection 0.4 mg    [DISCONTINUED] ondansetron (ZOFRAN) injection 4 mg    No facility-administered encounter medications on file as of 12/22/2021.    Review of Systems  Constitutional:  Negative for appetite change, chills, fatigue, fever  and unexpected weight change.  HENT:  Negative for congestion, dental problem, ear discharge, ear pain, facial swelling, hearing loss, nosebleeds, postnasal drip, rhinorrhea, sinus pressure, sinus pain, sneezing, sore throat, tinnitus and trouble swallowing.   Eyes:  Negative for pain, discharge, redness, itching and visual disturbance.  Respiratory:  Negative for cough, chest tightness, shortness of breath and wheezing.   Cardiovascular:  Negative for chest pain, palpitations and leg swelling.  Gastrointestinal:  Positive for abdominal pain. Negative for abdominal distention, blood in stool, constipation, diarrhea, nausea and vomiting.       Epigastric pain   Endocrine: Negative for cold intolerance, heat intolerance, polydipsia, polyphagia and polyuria.  Genitourinary:  Negative for difficulty urinating, dysuria, flank pain, frequency and urgency.  Musculoskeletal:  Negative for arthralgias, back pain, gait problem, joint swelling, myalgias, neck pain and neck stiffness.  Skin:  Negative for color change, pallor, rash and wound.  Neurological:  Negative for dizziness, syncope, speech difficulty, weakness, light-headedness, numbness and headaches.  Hematological:  Does not bruise/bleed easily.  Psychiatric/Behavioral:  Negative for agitation, behavioral problems, confusion, hallucinations, self-injury, sleep disturbance and suicidal ideas. The patient is not nervous/anxious.    Immunization History  Administered Date(s) Administered   Influenza,inj,Quad PF,6+ Mos 09/15/2021   Pertinent  Health Maintenance Due  Topic Date Due   FOOT EXAM  Never done   OPHTHALMOLOGY EXAM  Never done   URINE MICROALBUMIN  Never done   HEMOGLOBIN A1C  03/14/2022   INFLUENZA VACCINE  Completed   Fall Risk 12/21/2021 12/21/2021 12/21/2021 12/22/2021 12/22/2021  Falls in the past year? - - - -  0  Was there an injury with Fall? - - - - 0  Fall Risk Category Calculator - - - - 0  Fall Risk Category - - - - Low   Patient Fall Risk Level Low fall risk Moderate fall risk Moderate fall risk Low fall risk Low fall risk  Patient at Risk for Falls Due to - - - - No Fall Risks  Fall risk Follow up - - - - Falls evaluation completed   Functional Status Survey:    Vitals:   12/22/21 1455  BP: 116/80  Pulse: (!) 105  Resp: 16  Temp: 97.7 F (36.5 C)  SpO2: 97%  Weight: 129 lb 9.6 oz (58.8 kg)  Height: '6\' 5"'  (1.956 m)   Body mass index is 15.37 kg/m. Physical Exam Vitals reviewed.  Constitutional:      General: He is not in acute distress.    Appearance: Normal appearance. He is underweight. He is not ill-appearing or diaphoretic.  HENT:     Head: Normocephalic.     Right Ear: Tympanic membrane, ear canal and external ear normal. There is no impacted cerumen.     Left Ear: Tympanic membrane, ear canal and external ear normal. There is no impacted cerumen.     Nose: Nose normal. No congestion or rhinorrhea.     Mouth/Throat:     Mouth: Mucous membranes are moist.     Pharynx: Oropharynx is clear. No oropharyngeal exudate or posterior oropharyngeal erythema.  Eyes:     General: No scleral icterus.       Right eye: No discharge.        Left eye: No discharge.     Extraocular Movements: Extraocular movements intact.     Conjunctiva/sclera: Conjunctivae normal.     Pupils: Pupils are equal, round, and reactive to light.  Neck:     Vascular: No carotid bruit.  Cardiovascular:     Rate and Rhythm: Normal rate and regular rhythm.     Pulses: Normal pulses.     Heart sounds: Normal heart sounds. No murmur heard.   No friction rub. No gallop.  Pulmonary:     Effort: Pulmonary effort is normal. No respiratory distress.     Breath sounds: Normal breath sounds. No wheezing, rhonchi or rales.  Chest:     Chest wall: No tenderness.  Abdominal:     General: Bowel sounds are normal. There is no distension.     Palpations: Abdomen is soft. There is no mass.     Tenderness: There is no abdominal  tenderness. There is no right CVA tenderness, left CVA tenderness, guarding or rebound.  Musculoskeletal:        General: No swelling or tenderness. Normal range of motion.     Cervical back: Normal range of motion. No rigidity or tenderness.     Right lower leg: No edema.     Left lower leg: No edema.  Lymphadenopathy:     Cervical: No cervical adenopathy.  Skin:    General: Skin is warm and dry.     Coloration: Skin is not pale.     Findings: No bruising, erythema, lesion or rash.  Neurological:     Mental Status: He is alert and oriented to person, place, and time.     Cranial Nerves: No cranial nerve deficit.     Sensory: No sensory deficit.     Motor: No weakness.     Coordination: Coordination normal.     Gait: Gait normal.  Psychiatric:        Mood and Affect: Mood normal.        Speech: Speech normal.        Behavior: Behavior normal.        Thought Content: Thought content normal.        Judgment: Judgment normal.    Labs reviewed: Recent Labs    09/15/21 0449 09/16/21 0447 10/09/21 1525 12/07/21 0549 12/08/21 0427 12/21/21 0033 12/21/21 0342 12/21/21 0900  NA 131* 129*   < > 130*   < > 116* 125* 132*  K 4.5 3.8   < > 3.2*   < > 5.3* 4.3 3.7  CL 96* 94*   < > 94*   < > 76* 82* 91*  CO2 28 28   < > 24   < > '24 27 30  ' GLUCOSE 211* 231*   < > 231*   < > 1,159* 776* 163*  BUN 11 14   < > 6   < > 17 20 22*  CREATININE 0.61 0.55*   < > 0.81   < > 1.20 1.17 1.65*  CALCIUM 8.7* 8.5*   < > 9.0   < > 9.9 10.5* 10.9*  MG 1.7 2.0  --  1.6*  --   --  3.0*  --   PHOS 1.5* 3.6  --   --   --   --  5.8*  --    < > = values in this interval not displayed.   Recent Labs    12/09/21 0821 12/21/21 0033 12/21/21 0342  AST 184* 20 18  ALT 157* 54* 55*  ALKPHOS 970* 640* 672*  BILITOT 1.0 0.6 0.6  PROT 6.0* 8.5* 8.9*  ALBUMIN 3.1* 4.3 4.5   Recent Labs    12/09/21 0821 12/21/21 0033 12/21/21 0342  WBC 6.0 8.7 8.2  NEUTROABS 4.3 6.8 6.6  HGB 10.9* 13.2 14.4  HCT  32.6* 38.3* 40.8  MCV 104.8* 102.7* 99.0  PLT 371 453* 489*   Lab Results  Component Value Date   TSH 0.391 09/26/2020   Lab Results  Component Value Date   HGBA1C 10.1 (H) 09/14/2021   Lab Results  Component Value Date   CHOL 163 09/26/2020   HDL 76 09/26/2020   LDLCALC 78 09/26/2020   TRIG 35 12/08/2021   CHOLHDL 2.1 09/26/2020    Significant Diagnostic Results in last 30 days:  CT ABDOMEN PELVIS W CONTRAST  Result Date: 12/07/2021 CLINICAL DATA:  Epigastric abdominal pain. EXAM: CT ABDOMEN AND PELVIS WITH CONTRAST TECHNIQUE: Multidetector CT imaging of the abdomen and pelvis was performed using the standard protocol following bolus administration of intravenous contrast. RADIATION DOSE REDUCTION: This exam was performed according to the departmental dose-optimization program which includes automated exposure control, adjustment of the mA and/or kV according to patient size and/or use of iterative reconstruction technique. CONTRAST:  105m OMNIPAQUE IOHEXOL 300 MG/ML  SOLN COMPARISON:  10/07/2021 FINDINGS: Lower chest: Unremarkable. Hepatobiliary: Mildly dilated gallbladder with no visible gallstones and no gallbladder wall thickening. Small amount of pericholecystic fluid. Unremarkable liver. An oval area of fat deposition is noted in the liver adjacent to the falciform ligament. Pancreas: Interval resolution and marked decrease in size of previously demonstrated large pancreatic and peripancreatic fluid collections. There is a residual collection in the distal pancreatic tail with mild thin internal septation measuring 3.7 x 2.4 cm on image number 45/3. This previously measured 9.6 x 8.3 cm. Multiple small pancreatic calcifications are again demonstrated.  Diffuse peripancreatic edema. Spleen: Normal in size without focal abnormality. Adrenals/Urinary Tract: Adrenal glands are unremarkable. Kidneys are normal, without renal calculi, focal lesion, or hydronephrosis. Bladder is unremarkable.  Stomach/Bowel: Unremarkable stomach, small bowel and colon. The appendix is not well visualized. No findings suspicious for appendicitis. Vascular/Lymphatic: No significant vascular findings are present. No enlarged abdominal or pelvic lymph nodes. Normal opacification of the splenic vein is currently demonstrated. Reproductive: Prostate is unremarkable. Other: No abdominal wall hernia or abnormality. No abdominopelvic ascites. Musculoskeletal: Unremarkable bones. IMPRESSION: 1. Interval changes of acute pancreatitis. 2. Interval significant decrease in size of a pancreatic tail pseudocyst with resolution of other previously demonstrated pseudocysts. 3. Stable changes of chronic pancreatitis. 4. Gallbladder distension and mild pericholecystic fluid, most likely due to the adjacent changes of acute pancreatitis. Electronically Signed   By: Claudie Revering M.D.   On: 12/07/2021 08:56   NM Hepato W/EF  Result Date: 12/09/2021 CLINICAL DATA:  Patient was dosed iv with 5.3 mci 27mc Choletec. Hx DM type I, alcoholic pancreatitis with complications of pseudocyst, and splenic vein thrombosis who presents with complaints of worsening epigastric abdominal pain. EXAM: NUCLEAR MEDICINE HEPATOBILIARY IMAGING WITH GALLBLADDER EF TECHNIQUE: Sequential images of the abdomen were obtained out to 60 minutes following intravenous administration of radiopharmaceutical. After oral ingestion of Ensure, gallbladder ejection fraction was determined. At 60 min, normal ejection fraction is greater than 33%. RADIOPHARMACEUTICALS:  5.3 mCi Tc-958mCholetec IV COMPARISON:  CT 12/07/2021 FINDINGS: Prompt uptake and biliary excretion of activity by the liver is seen. Gallbladder activity is visualized, consistent with patency of cystic duct. Biliary activity passes into small bowel, consistent with patent common bile duct. Fatty meal was provided to provoke contraction of the gallbladder. The gallbladder contracted only minimally. Calculated  gallbladder ejection fraction is 12%. (Normal gallbladder ejection fraction with Ensure is greater than 33%.) IMPRESSION: 1. Low gallbladder ejection fraction. 2. Patent cystic duct and common bile duct. Electronically Signed   By: StSuzy Bouchard.D.   On: 12/09/2021 15:41   DG Chest Port 1 View  Result Date: 12/21/2021 CLINICAL DATA:  Nausea and vomiting for 2 weeks EXAM: PORTABLE CHEST 1 VIEW COMPARISON:  09/13/2021 FINDINGS: Normal heart size and mediastinal contours. No acute infiltrate or edema. No effusion or pneumothorax. No acute osseous findings. Artifact from EKG leads. IMPRESSION: Negative portable chest. Electronically Signed   By: JoJorje Guild.D.   On: 12/21/2021 06:22   USKoreabdomen Limited RUQ (LIVER/GB)  Result Date: 12/07/2021 CLINICAL DATA:  Right upper quadrant pain for 1 year. EXAM: ULTRASOUND ABDOMEN LIMITED RIGHT UPPER QUADRANT COMPARISON:  CT abdomen and pelvis 12/07/2021. Right upper quadrant ultrasound 09/26/2020. FINDINGS: Gallbladder: Moderately distended gallbladder containing a small amount of sludge. Possible 4 mm polyp or sludge ball for which no follow-up imaging is recommended. No shadowing gallstones or gallbladder wall thickening. No sonographic Murphy sign noted by sonographer. Common bile duct: Diameter: 6 mm Liver: No focal lesion identified. Within normal limits in parenchymal echogenicity. Portal vein is patent on color Doppler imaging with normal direction of blood flow towards the liver. Other: None. IMPRESSION: 1. Distended gallbladder containing sludge. No gallstones or evidence of cholecystitis. 2. No biliary dilatation. Electronically Signed   By: AlLogan Bores.D.   On: 12/07/2021 13:40    Assessment/Plan 1. Type 1 diabetes mellitus with ketoacidosis without coma (HCC) Lab Results  Component Value Date   HGBA1C 10.1 (H) 09/14/2021  - continue on Lantus and Novolog  - CBC with Differential/Platelet -  CMP with eGFR([facility]) - Ambulatory referral to  Ophthalmology  2. Chronic pancreatitis due to chronic alcoholism ([place]) - follow up with Endocrinologist as directed  - CBC with Differential/Platelet - CMP with eGFR([facility]) - HYDROmorphone (DILAUDID) 4 MG tablet; Take 1 tablet (4 mg total) by mouth every 6 (six) hours as needed for severe pain.  Dispense: 90 tablet; Refill: 0  3. Blurry vision, bilateral Suspect due to hyperglycemia  Refer to Ophthalmology  - Ambulatory referral to Ophthalmology  4. Abdominal pain, chronic, epigastric Ongoing Epigastric abdominal pain.off oxycodone due to recent elevated liver enzymes cannot take tylenol products.He was on dilaudid on during recent hospital admission which helped with pain control.will restart on dilaudid while awaiting Endocrinologist and Gastroenterology appointment.  Narcotic uses contract reviewed and signed by patient during visit.PDMP reviewed. - HYDROmorphone (DILAUDID) 4 MG tablet; Take 1 tablet (4 mg total) by mouth every 6 (six) hours as needed for severe pain.  Dispense: 90 tablet; Refill: 0  5. Nausea and vomiting  Continue on Zofran 8 mg tablet every 8 hrs PRN - follow up with GI as directed   Family/ staff Communication: Reviewed plan of care with patient verbalized understanding   Labs/tests ordered:  - CBC with Differential/Platelet - CMP with eGFR([facility])  Next Appointment:As needed if symptoms worsen or fail to improve     Sandrea Hughs, NP

## 2021-12-22 NOTE — TOC Transition Note (Signed)
Transition of Care [facility]) - CM/SW Discharge Note   Patient Details  Name: Carlos [name] MRN: [id number] Date of Birth: Oct 31, 1986  [facility] CM/SW Contact:  Golda Acre, RN Phone Number: 12/22/2021, 10:01 AM   Clinical Narrative:    Patient followed for toc needs none present at time of dc   Final next level of care: Home/Self Care Barriers to Discharge: Barriers Resolved   Patient Goals and CMS Choice Patient states their goals for this hospitalization and ongoing recovery are:: to go home CMS Medicare.gov Compare Post Acute Care list provided to:: Patient    Discharge Placement                       Discharge Plan and Services   Discharge Planning Services: CM Consult                                 Social Determinants of Health (SDOH) Interventions     Readmission Risk Interventions Readmission Risk Prevention Plan 10/07/2020  Transportation Screening Complete  PCP or Specialist Appt within 5-7 Days Complete  Home Care Screening Complete  Medication Review (RN CM) Complete

## 2021-12-22 NOTE — Discharge Summary (Signed)
Physician Discharge Summary  Jp Eastham YQM:[id number] DOB: Feb 09, 1986 DOA: 12/20/2021  PCP: [name], MD  Admit date: 12/20/2021  Discharge date: 12/22/2021  Admitted From:Home  Disposition:  Home  Recommendations for Outpatient Follow-up:  Follow up with PCP in 1-2 weeks Please follow-up with endocrinology with educational resources and referrals provided Remain now on Lantus 16 units daily and NovoLog 3 units 3 times daily Freestyle libre sensors and kit provided for close glucose monitoring Continue other home medications as prior  Home Health: None  Equipment/Devices: None  Discharge Condition:Stable  CODE STATUS: Full  Diet recommendation: Heart Healthy/carb modified  Brief/Interim Summary: Carlos [name] is a 36 y.o. male with medical history significant for type 1 diabetes mellitus, chronic pancreatitis, who is admitted to [facility] on 12/20/2021 with suspected hyperosmolar hyperglycemic nonketotic state after presenting from home to [facility] ED complaining of nausea/vomiting.  It is uncertain what had caused his symptoms as he states that he was quite compliant with his home insulin regimen and there was no overt evidence of any infectious processes.  He was started on insulin drip and IV fluids with quick improvement in his condition overnight.  He is noted to have history of chronic epigastric pain and relation to his pancreatitis as well, but he was not noted to have any acute pancreatitis and had remained on his Creon.  He is now tolerating his diet with no further nausea or vomiting symptoms and has stable blood glucose readings.  He has had his insulin adjusted per diabetes coordinator recommendations and A1c is still pending.  He will need close follow-up with his PCP as well as with endocrinology with list provided as well as educational resources.  No other acute events noted.  Discharge Diagnoses:  Principal Problem:   HHNC (hyperglycemic hyperosmolar nonketotic  coma) ([place]) Active Problems:   Epigastric pain   Diabetes mellitus type 1 (HCC)   Nausea & vomiting   Dehydration   AKI (acute kidney injury) ([place])  Principal discharge diagnosis: Hyperglycemic hyperosmolar nonketotic state in the setting of type 1 diabetes.  History of chronic pancreatitis.  Discharge Instructions  Discharge Instructions     Ambulatory referral to Nutrition and Diabetic Education   Complete by: As directed    Diet - low sodium heart healthy   Complete by: As directed    Increase activity slowly   Complete by: As directed       Allergies as of 12/22/2021       Reactions   Other Anaphylaxis   [name] syrup        Medication List     TAKE these medications    blood glucose meter kit and supplies Kit Dispense based on patient and insurance preference. Use up to four times daily as directed.   feeding supplement (GLUCERNA SHAKE) Liqd Take 237 mLs by mouth 2 (two) times daily between meals.   FreeStyle Libre 2 [employer] Use as directed.   ibuprofen 200 MG tablet Commonly known as: ADVIL Take 200 mg by mouth every 6 (six) hours as needed for fever, headache, mild pain or moderate pain.   insulin aspart 100 UNIT/ML FlexPen Commonly known as: NOVOLOG Inject 3 Units into the skin 3 (three) times daily with meals. What changed: how much to take   insulin glargine 100 UNIT/ML Solostar Pen Commonly known as: LANTUS Inject 16 Units into the skin daily. What changed: how much to take   lipase/protease/amylase 36000 UNITS Cpep capsule Commonly known as: Creon Take  2 capsules (72,000 Units total) by mouth 3 (three) times daily with meals. May also take 1 capsule (36,000 Units total) as needed (with snacks). What changed: See the new instructions.   ondansetron 8 MG tablet Commonly known as: Zofran Take 1 tablet (8 mg total) by mouth every 8 (eight) hours as needed for nausea or vomiting.   Pen Needles 3/16" 31G X 5 MM Misc Use as directed  with insulin pen        Follow-up Information     [name], MD. Schedule an appointment as soon as possible for a visit.   Specialties: Family Medicine, Emergency Medicine Contact information: [place] [address] [phone]         endocrinology. Schedule an appointment as soon as possible for a visit.                 Allergies  Allergen Reactions   Other Anaphylaxis    [name] syrup    Consultations: None   Procedures/Studies: CT ABDOMEN PELVIS W CONTRAST  Result Date: 12/07/2021 CLINICAL DATA:  Epigastric abdominal pain. EXAM: CT ABDOMEN AND PELVIS WITH CONTRAST TECHNIQUE: Multidetector CT imaging of the abdomen and pelvis was performed using the standard protocol following bolus administration of intravenous contrast. RADIATION DOSE REDUCTION: This exam was performed according to the departmental dose-optimization program which includes automated exposure control, adjustment of the mA and/or kV according to patient size and/or use of iterative reconstruction technique. CONTRAST:  81m OMNIPAQUE IOHEXOL 300 MG/ML  SOLN COMPARISON:  10/07/2021 FINDINGS: Lower chest: Unremarkable. Hepatobiliary: Mildly dilated gallbladder with no visible gallstones and no gallbladder wall thickening. Small amount of pericholecystic fluid. Unremarkable liver. An oval area of fat deposition is noted in the liver adjacent to the falciform ligament. Pancreas: Interval resolution and marked decrease in size of previously demonstrated large pancreatic and peripancreatic fluid collections. There is a residual collection in the distal pancreatic tail with mild thin internal septation measuring 3.7 x 2.4 cm on image number 45/3. This previously measured 9.6 x 8.3 cm. Multiple small pancreatic calcifications are again demonstrated. Diffuse peripancreatic edema. Spleen: Normal in size without focal abnormality. Adrenals/Urinary Tract: Adrenal glands are unremarkable. Kidneys  are normal, without renal calculi, focal lesion, or hydronephrosis. Bladder is unremarkable. Stomach/Bowel: Unremarkable stomach, small bowel and colon. The appendix is not well visualized. No findings suspicious for appendicitis. Vascular/Lymphatic: No significant vascular findings are present. No enlarged abdominal or pelvic lymph nodes. Normal opacification of the splenic vein is currently demonstrated. Reproductive: Prostate is unremarkable. Other: No abdominal wall hernia or abnormality. No abdominopelvic ascites. Musculoskeletal: Unremarkable bones. IMPRESSION: 1. Interval changes of acute pancreatitis. 2. Interval significant decrease in size of a pancreatic tail pseudocyst with resolution of other previously demonstrated pseudocysts. 3. Stable changes of chronic pancreatitis. 4. Gallbladder distension and mild pericholecystic fluid, most likely due to the adjacent changes of acute pancreatitis. Electronically Signed   By: SClaudie ReveringM.D.   On: 12/07/2021 08:56   NM Hepato W/EF  Result Date: 12/09/2021 CLINICAL DATA:  Patient was dosed iv with 5.3 mci 981m Choletec. Hx DM type I, alcoholic pancreatitis with complications of pseudocyst, and splenic vein thrombosis who presents with complaints of worsening epigastric abdominal pain. EXAM: NUCLEAR MEDICINE HEPATOBILIARY IMAGING WITH GALLBLADDER EF TECHNIQUE: Sequential images of the abdomen were obtained out to 60 minutes following intravenous administration of radiopharmaceutical. After oral ingestion of Ensure, gallbladder ejection fraction was determined. At 60 min, normal ejection fraction is greater than 33%. RADIOPHARMACEUTICALS:  5.3 mCi Tc-2m Choletec IV COMPARISON:  CT 12/07/2021 FINDINGS: Prompt uptake and biliary excretion of activity by the liver is seen. Gallbladder activity is visualized, consistent with patency of cystic duct. Biliary activity passes into small bowel, consistent with patent common bile duct. Fatty meal was provided to  provoke contraction of the gallbladder. The gallbladder contracted only minimally. Calculated gallbladder ejection fraction is 12%. (Normal gallbladder ejection fraction with Ensure is greater than 33%.) IMPRESSION: 1. Low gallbladder ejection fraction. 2. Patent cystic duct and common bile duct. Electronically Signed   By: SSuzy BouchardM.D.   On: 12/09/2021 15:41   DG Chest Port 1 View  Result Date: 12/21/2021 CLINICAL DATA:  Nausea and vomiting for 2 weeks EXAM: PORTABLE CHEST 1 VIEW COMPARISON:  09/13/2021 FINDINGS: Normal heart size and mediastinal contours. No acute infiltrate or edema. No effusion or pneumothorax. No acute osseous findings. Artifact from EKG leads. IMPRESSION: Negative portable chest. Electronically Signed   By: JJorje GuildM.D.   On: 12/21/2021 06:22   UKoreaAbdomen Limited RUQ (LIVER/GB)  Result Date: 12/07/2021 CLINICAL DATA:  Right upper quadrant pain for 1 year. EXAM: ULTRASOUND ABDOMEN LIMITED RIGHT UPPER QUADRANT COMPARISON:  CT abdomen and pelvis 12/07/2021. Right upper quadrant ultrasound 09/26/2020. FINDINGS: Gallbladder: Moderately distended gallbladder containing a small amount of sludge. Possible 4 mm polyp or sludge ball for which no follow-up imaging is recommended. No shadowing gallstones or gallbladder wall thickening. No sonographic Murphy sign noted by sonographer. Common bile duct: Diameter: 6 mm Liver: No focal lesion identified. Within normal limits in parenchymal echogenicity. Portal vein is patent on color Doppler imaging with normal direction of blood flow towards the liver. Other: None. IMPRESSION: 1. Distended gallbladder containing sludge. No gallstones or evidence of cholecystitis. 2. No biliary dilatation. Electronically Signed   By: ALogan BoresM.D.   On: 12/07/2021 13:40     Discharge Exam: Vitals:   12/22/21 0451 12/22/21 0800  BP: (!) 136/97 (!) 142/98  Pulse: 98 98  Resp: 17 18  Temp: 98 F (36.7 C) 98.1 F (36.7 C)  SpO2: 100% 100%    Vitals:   12/21/21 1917 12/21/21 2310 12/22/21 0451 12/22/21 0800  BP: (!) 126/94 (!) 144/106 (!) 136/97 (!) 142/98  Pulse: (!) 113 (!) 105 98 98  Resp: '17 18 17 18  ' Temp: 98.8 F (37.1 C) 98.1 F (36.7 C) 98 F (36.7 C) 98.1 F (36.7 C)  TempSrc: Oral Oral Oral Oral  SpO2: 100% 100% 100% 100%  Weight:      Height:        General: Pt is alert, awake, not in acute distress Cardiovascular: RRR, S1/S2 +, no rubs, no gallops Respiratory: CTA bilaterally, no wheezing, no rhonchi Abdominal: Soft, NT, ND, bowel sounds + Extremities: no edema, no cyanosis    The results of significant diagnostics from this hospitalization (including imaging, microbiology, ancillary and laboratory) are listed below for reference.     Microbiology: Recent Results (from the past 240 hour(s))  Resp Panel by RT-PCR (Flu A&B, Covid)     Status: None   Collection Time: 12/21/21  3:42 AM   Specimen: Nasopharyngeal(NP) swabs in vial transport medium  Result Value Ref Range Status   SARS Coronavirus 2 by RT PCR NEGATIVE NEGATIVE Final    Comment: (NOTE) SARS-CoV-2 target nucleic acids are NOT DETECTED.  The SARS-CoV-2 RNA is generally detectable in upper respiratory specimens during the acute phase of infection. The lowest concentration of SARS-CoV-2 viral copies this assay  can detect is 138 copies/mL. A negative result does not preclude SARS-Cov-2 infection and should not be used as the sole basis for treatment or other patient management decisions. A negative result may occur with  improper specimen collection/handling, submission of specimen other than nasopharyngeal swab, presence of viral mutation(s) within the areas targeted by this assay, and inadequate number of viral copies(<138 copies/mL). A negative result must be combined with clinical observations, patient history, and epidemiological information. The expected result is Negative.  Fact Sheet for Patients:   EntrepreneurPulse.com.au  Fact Sheet for Healthcare Providers:  IncredibleEmployment.be  This test is no t yet approved or cleared by the [place] FDA and  has been authorized for detection and/or diagnosis of SARS-CoV-2 by FDA under an Emergency Use Authorization (EUA). This EUA will remain  in effect (meaning this test can be used) for the duration of the COVID-19 declaration under Section 564(b)(1) of the Act, 21 U.S.C.section 360bbb-3(b)(1), unless the authorization is terminated  or revoked sooner.       Influenza A by PCR NEGATIVE NEGATIVE Final   Influenza B by PCR NEGATIVE NEGATIVE Final    Comment: (NOTE) The Xpert Xpress SARS-CoV-2/FLU/RSV plus assay is intended as an aid in the diagnosis of influenza from Nasopharyngeal swab specimens and should not be used as a sole basis for treatment. Nasal washings and aspirates are unacceptable for Xpert Xpress SARS-CoV-2/FLU/RSV testing.  Fact Sheet for Patients: EntrepreneurPulse.com.au  Fact Sheet for Healthcare Providers: IncredibleEmployment.be  This test is not yet approved or cleared by the [place] FDA and has been authorized for detection and/or diagnosis of SARS-CoV-2 by FDA under an Emergency Use Authorization (EUA). This EUA will remain in effect (meaning this test can be used) for the duration of the COVID-19 declaration under Section 564(b)(1) of the Act, 21 U.S.C. section 360bbb-3(b)(1), unless the authorization is terminated or revoked.  Performed at [facility], [place] [address], [place], [place] [address]      Labs: BNP (last 3 results) No results for input(s): BNP in the last 8760 hours. Basic Metabolic Panel: Recent Labs  Lab 12/21/21 0033 12/21/21 0342 12/21/21 0900  NA 116* 125* 132*  K 5.3* 4.3 3.7  CL 76* 82* 91*  CO2 '24 27 30  ' GLUCOSE 1,159* 776* 163*  BUN 17 20 22*  CREATININE 1.20 1.17  1.65*  CALCIUM 9.9 10.5* 10.9*  MG  --  3.0*  --   PHOS  --  5.8*  --    Liver Function Tests: Recent Labs  Lab 12/21/21 0033 12/21/21 0342  AST 20 18  ALT 54* 55*  ALKPHOS 640* 672*  BILITOT 0.6 0.6  PROT 8.5* 8.9*  ALBUMIN 4.3 4.5   Recent Labs  Lab 12/21/21 0033  LIPASE 114*   No results for input(s): AMMONIA in the last 168 hours. CBC: Recent Labs  Lab 12/21/21 0033 12/21/21 0342  WBC 8.7 8.2  NEUTROABS 6.8 6.6  HGB 13.2 14.4  HCT 38.3* 40.8  MCV 102.7* 99.0  PLT 453* 489*   Cardiac Enzymes: No results for input(s): CKTOTAL, CKMB, CKMBINDEX, TROPONINI in the last 168 hours. BNP: Invalid input(s): POCBNP CBG: Recent Labs  Lab 12/21/21 1235 12/21/21 1459 12/21/21 1712 12/21/21 2041 12/22/21 0748  GLUCAP 149* 252* 404* 270* 253*   D-Dimer No results for input(s): DDIMER in the last 72 hours. Hgb A1c No results for input(s): HGBA1C in the last 72 hours. Lipid Profile No results for input(s): CHOL, HDL, LDLCALC, TRIG, CHOLHDL, LDLDIRECT  in the last 72 hours. Thyroid function studies No results for input(s): TSH, T4TOTAL, T3FREE, THYROIDAB in the last 72 hours.  Invalid input(s): FREET3 Anemia work up No results for input(s): VITAMINB12, FOLATE, FERRITIN, TIBC, IRON, RETICCTPCT in the last 72 hours. Urinalysis    Component Value Date/Time   COLORURINE COLORLESS (A) 12/21/2021 0300   APPEARANCEUR CLEAR 12/21/2021 0300   LABSPEC 1.027 12/21/2021 0300   PHURINE 5.0 12/21/2021 0300   GLUCOSEU >=500 (A) 12/21/2021 0300   HGBUR NEGATIVE 12/21/2021 0300   BILIRUBINUR NEGATIVE 12/21/2021 0300   KETONESUR 5 (A) 12/21/2021 0300   PROTEINUR NEGATIVE 12/21/2021 0300   NITRITE NEGATIVE 12/21/2021 0300   LEUKOCYTESUR NEGATIVE 12/21/2021 0300   Sepsis Labs Invalid input(s): PROCALCITONIN,  WBC,  LACTICIDVEN Microbiology Recent Results (from the past 240 hour(s))  Resp Panel by RT-PCR (Flu A&B, Covid)     Status: None   Collection Time: 12/21/21  3:42 AM    Specimen: Nasopharyngeal(NP) swabs in vial transport medium  Result Value Ref Range Status   SARS Coronavirus 2 by RT PCR NEGATIVE NEGATIVE Final    Comment: (NOTE) SARS-CoV-2 target nucleic acids are NOT DETECTED.  The SARS-CoV-2 RNA is generally detectable in upper respiratory specimens during the acute phase of infection. The lowest concentration of SARS-CoV-2 viral copies this assay can detect is 138 copies/mL. A negative result does not preclude SARS-Cov-2 infection and should not be used as the sole basis for treatment or other patient management decisions. A negative result may occur with  improper specimen collection/handling, submission of specimen other than nasopharyngeal swab, presence of viral mutation(s) within the areas targeted by this assay, and inadequate number of viral copies(<138 copies/mL). A negative result must be combined with clinical observations, patient history, and epidemiological information. The expected result is Negative.  Fact Sheet for Patients:  EntrepreneurPulse.com.au  Fact Sheet for Healthcare Providers:  IncredibleEmployment.be  This test is no t yet approved or cleared by the [place] FDA and  has been authorized for detection and/or diagnosis of SARS-CoV-2 by FDA under an Emergency Use Authorization (EUA). This EUA will remain  in effect (meaning this test can be used) for the duration of the COVID-19 declaration under Section 564(b)(1) of the Act, 21 U.S.C.section 360bbb-3(b)(1), unless the authorization is terminated  or revoked sooner.       Influenza A by PCR NEGATIVE NEGATIVE Final   Influenza B by PCR NEGATIVE NEGATIVE Final    Comment: (NOTE) The Xpert Xpress SARS-CoV-2/FLU/RSV plus assay is intended as an aid in the diagnosis of influenza from Nasopharyngeal swab specimens and should not be used as a sole basis for treatment. Nasal washings and aspirates are unacceptable for Xpert  Xpress SARS-CoV-2/FLU/RSV testing.  Fact Sheet for Patients: EntrepreneurPulse.com.au  Fact Sheet for Healthcare Providers: IncredibleEmployment.be  This test is not yet approved or cleared by the [place] FDA and has been authorized for detection and/or diagnosis of SARS-CoV-2 by FDA under an Emergency Use Authorization (EUA). This EUA will remain in effect (meaning this test can be used) for the duration of the COVID-19 declaration under Section 564(b)(1) of the Act, 21 U.S.C. section 360bbb-3(b)(1), unless the authorization is terminated or revoked.  Performed at [facility], [place] [address], [place], [place] [address]      Time coordinating discharge: 35 minutes  SIGNED:   Rodena Goldmann, DO [facility] 12/22/2021, 9:52 AM  If 7PM-7AM, please contact night-coverage [email or web address]

## 2021-12-22 NOTE — Progress Notes (Signed)
Patient given discharge, follow up, and medication instructions, verbalized understanding< ivs x 2 and telemetry monitor removed, personal belongings with patient, transport home per friend/family

## 2021-12-22 NOTE — Plan of Care (Signed)
°  Problem: Health Behavior/Discharge Planning: Goal: Ability to manage health-related needs will improve Outcome: Adequate for Discharge   Problem: Clinical Measurements: Goal: Will remain free from infection Outcome: Adequate for Discharge Goal: Diagnostic test results will improve Outcome: Adequate for Discharge   Problem: Nutrition: Goal: Adequate nutrition will be maintained Outcome: Adequate for Discharge   Problem: Coping: Goal: Level of anxiety will decrease Outcome: Adequate for Discharge   Problem: Pain Managment: Goal: General experience of comfort will improve Outcome: Adequate for Discharge   Problem: Safety: Goal: Ability to remain free from injury will improve Outcome: Adequate for Discharge

## 2021-12-22 NOTE — Telephone Encounter (Signed)
Transition Care Management Follow-up Telephone Call Date of discharge and from where: 12/22/2021 [place] How have you been since you were released from the hospital? better Any questions or concerns? Yes Pain medication and having blurred vision  Items Reviewed: Did the pt receive and understand the discharge instructions provided? Yes  Medications obtained and verified? Yes  Other? No  Any new allergies since your discharge? No  Dietary orders reviewed? Yes Do you have support at home? Yes   Home Care and Equipment/Supplies: Were home health services ordered? no If so, what is the name of the agency? na  Has the agency set up a time to come to the patient's home? no Were any new equipment or medical supplies ordered?  No What is the name of the medical supply agency? na Were you able to get the supplies/equipment? no Do you have any questions related to the use of the equipment or supplies? No  Functional Questionnaire: (I = Independent and D = Dependent) ADLs: I  Bathing/Dressing- I  Meal Prep- I  Eating- I  Maintaining continence- I  Transferring/Ambulation- I  Managing Meds- I  Follow up appointments reviewed:  PCP Hospital f/u appt confirmed? Yes  Scheduled to see Dinah on 12/22/2020 @ 2:45. Specialist Hospital f/u appt confirmed? No  Are transportation arrangements needed? No  If their condition worsens, is the pt aware to call PCP or go to the Emergency Dept.? Yes Was the patient provided with contact information for the PCP's office or ED? Yes Was to pt encouraged to call back with questions or concerns? Yes

## 2021-12-22 NOTE — Progress Notes (Signed)
Inpatient Diabetes Program Recommendations  AACE/ADA: New Consensus Statement on Inpatient Glycemic Control (2015)  Target Ranges:  Prepandial:   less than 140 mg/dL      Peak postprandial:   less than 180 mg/dL (1-2 hours)      Critically ill patients:  140 - 180 mg/dL   Lab Results  Component Value Date   GLUCAP 253 (H) 12/22/2021   HGBA1C 10.1 (H) 09/14/2021    Review of Glycemic Control  Latest Reference Range & Units 12/21/21 14:59 12/21/21 17:12 12/21/21 20:41 12/22/21 07:48  Glucose-Capillary 70 - 99 mg/dL 494 (H) 496 (H) 759 (H) 253 (H)  (H): Data is abnormally high Diabetes history: DM Outpatient Diabetes medications: Lantus 12 units QD, Novolog 4 units TID Current orders for Inpatient glycemic control: Semglee 12 units QD, Novolog 3 units TID, Novolog 0-9 units TID   Inpatient Diabetes Program Recommendations:     Consider: -increasing Semglee 16 units qd -adding Novolog 0-5 units QHS  Thanks, Lujean Rave, MSN, RNC-OB Diabetes Coordinator [phone] (8a-5p)

## 2021-12-24 ENCOUNTER — Telehealth: Payer: Self-pay

## 2021-12-24 NOTE — Telephone Encounter (Signed)
Patient will require long term for now until seen by Endocrinologist for pancreatitis.Liver enzymes elevated cannot take tylenol products.

## 2021-12-24 NOTE — Telephone Encounter (Signed)
Message left on clinical intake voicemail:   [facility] only filled 1/2 of patients prescription and we need to call in order for them to refill the other half (name of medication not left in message)   Spoke with patient and he states that he has tried oxycodone and tramadol with no relief  I called [facility] and was told patients insurance will only cover 5 day supply (20 tablets) of the dilaudid and if Dinah wants the patient to take this long term we would need to do a Prior Authorization.   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

## 2021-12-25 NOTE — Telephone Encounter (Signed)
Prior authorization for hydromorphone sent to [employer] via Cover my meds waiting on reply.

## 2021-12-25 NOTE — Telephone Encounter (Signed)
Late entry, yesterday I attempted on 2 occasions to complete a PA for Dilaudid. First Attempt was made under the notation that patient has [employer], however Covermymeds did not recognize the patient as being a member of Medical sales representative.  Second attempt was made under the notation that patient has [place] Medicaid [facility] and covermymeds could not identify patient nor find plan as listed in our system under document list.  I will forward to the medical assistant working clinical intake today, as she will need to contact patient, verify insurance or check with the [facility] administrative staff to determine, after gathering the correct insurance information she will know how to proceed with initiating PA for patient.

## 2021-12-29 NOTE — Telephone Encounter (Signed)
Received fax from [facility] requesting Additional Information Placed fax in Dinah's folder to review.   To be faxed back to Fax: [phone] once completed.  Reference #: [id number] Member ID: [id number]

## 2021-12-29 NOTE — Telephone Encounter (Signed)
Request faxed. Awaiting Determination.

## 2021-12-30 ENCOUNTER — Ambulatory Visit: Payer: Managed Care, Other (non HMO) | Admitting: Family Medicine

## 2021-12-30 ENCOUNTER — Other Ambulatory Visit: Payer: Self-pay | Admitting: *Deleted

## 2021-12-30 DIAGNOSIS — F1020 Alcohol dependence, uncomplicated: Secondary | ICD-10-CM

## 2021-12-30 DIAGNOSIS — K860 Alcohol-induced chronic pancreatitis: Secondary | ICD-10-CM

## 2021-12-30 MED ORDER — HYDROMORPHONE HCL 4 MG PO TABS: 4.0000 mg | ORAL_TABLET | Freq: Four times a day (QID) | ORAL | 0 refills | Status: DC | PRN

## 2021-12-30 NOTE — Telephone Encounter (Signed)
I called and spoke with Pharmacist at [facility] and she stated that patient was going to need a new Rx Faxed to them. Stated that she will run the insurance Approval at the time she receives the Rx.  Stated that patient Picked up and [employer] [facility] [place] for #20 on 12/22/2021 (5 days worth)  Pended Rx and sent to [facility] for approval.        also left a message for the office thank you    [name]  [facility] Clinical Pool (supporting [name], Dinah C, NP) 3 minutes ago (10:45 AM)   My insurance called said everything was approved on the 21st there are waiting for your office to send a prescription      [name]  [facility] Clinical Pool (supporting [name], Donalee Citrin, NP) Yesterday (10:35 AM)   Ok thank you    You  [name] Yesterday (8:55 AM)   Hi. We are awaiting the Prior Authorization from [employer]. They requested More information. Thanks.     [name]  Patient Medication Renewal Request Pool Yesterday (7:58 AM)   Refills have been requested for the following medications:       HYDROmorphone (DILAUDID) 4 MG tablet [Dinah C [name]]   Preferred pharmacy: [facility] - [place] - [address] Delivery method: [employer]

## 2021-12-30 NOTE — Telephone Encounter (Signed)
Script send to pharmacy

## 2021-12-31 MED ORDER — HYDROMORPHONE HCL 4 MG PO TABS: 4.0000 mg | ORAL_TABLET | Freq: Four times a day (QID) | ORAL | 0 refills | Status: DC | PRN

## 2021-12-31 NOTE — Telephone Encounter (Signed)
Spoke with patient and he requested Rx to be sent to [facility]

## 2021-12-31 NOTE — Telephone Encounter (Signed)
Patient is requesting Rx to be sent to [facility].  [facility] does not have Rx in stock.   Pended and sent to [facility] for approval.

## 2021-12-31 NOTE — Telephone Encounter (Signed)
Script send

## 2021-12-31 NOTE — Telephone Encounter (Signed)
Message routed to Dinah [name], NP. 

## 2022-01-01 ENCOUNTER — Telehealth: Payer: Self-pay | Admitting: Gastroenterology

## 2022-01-01 MED ORDER — PANCRELIPASE (LIP-PROT-AMYL) 36000-114000 UNITS PO CPEP: ORAL_CAPSULE | ORAL | 1 refills | Status: DC

## 2022-01-01 NOTE — Telephone Encounter (Signed)
Patient called requesting refill on Creon.

## 2022-01-01 NOTE — Telephone Encounter (Signed)
Refill for Creon has been sent to pharmacy.

## 2022-01-12 ENCOUNTER — Encounter: Payer: Medicaid Other | Attending: Family Medicine | Admitting: Nutrition

## 2022-01-12 ENCOUNTER — Other Ambulatory Visit: Payer: Self-pay

## 2022-01-12 DIAGNOSIS — E109 Type 1 diabetes mellitus without complications: Secondary | ICD-10-CM | POA: Diagnosis present

## 2022-01-12 NOTE — Progress Notes (Signed)
Patient reports that he is here today to review his diet and reports that he has changed his diet since last hospital visit.  Reports having eaten a lot of fruit , and juices throughout the day, and has since, stopped this.   ?Insulin dose:  Lantus 16u qAM. *9AM ?                      Humalog: 3u acL, acS and acS.  Does not adjust ?                      This dose based on meal size. ?SBGM:  Libre 2:  scanning ac and HS.  FBS today was 128 ?    Says blood sugars are acL150-low 200s, acS: 150-220.   ?Typical day:   ?7AM:  Up ?7:15:  BFAST:  no Humalog:  2-3 eggs, fried in butter, grits decaf black coffee. ?10:30-11AM:  granola bar ?1PM: lunch  Takes 3u Humolog before eating:  [place] sandwich with lettuce/tomato/and small amount of mayo, with 2 hand full of pretzels, water, or diet drink ?3-4PM: gruit, or 2 handfull of granola mix without the fruit, diet drink, diet fruit juice or water to drink ?6-7PM:   Supper:  6-8 ounces of protein, on starchy veg. (30 grams), and 1-2 non starchy veg.  With water or diet juice ?9:30:  granola as above ?11PM: sleep.  Denies eating during the night.Marland Kitchen   ?Discussion ?How his insulin works compared to someone without diabetes, and the need to take his Humalog ac meals and possibly snacks.  ?3 basic food groups, what foods are in each group, how they affect blood sugar, and need for all 3 of these in each meal for better blood sugar control, as well as what will happen to blood sugars if eating too much from each group ?Symptoms/Treatments of low blood sugars ?What blood sugar readings tell him about how they covered previous meals ?Need to adjust insulin dose based on amount of carbs eaten at each meal ?How diet juice also contains carbohydrtate that may require more Humalog when drinking this. ?He was shown the [facility] and given a brochure to review before seeing Dr. Lonzo Cloud in 6 weeks.   ?Linked his readings to [facility] in anticipation of his upcoming visit. ? Plan: ?1 take  humalog before meals including breakfast ?2. Make sure all meals have protein, carbs and small amount of fat ?3.  Notify MD office if blood sugars drop below 70 ?4.  Can increase humalog dose by 1u when eating meals high in carbs like pasta and pizza ?5. Scan libre sensor every 8 hours to capture all 24 hours of readings. ? ?

## 2022-01-13 NOTE — Patient Instructions (Signed)
1 take humalog before meals including breakfast ?2. Make sure all meals have protein, carbs and small amount of fat ?3.  Notify MD office if blood sugars drop below 70 ?4.  Can increase humalog dose by 1u when eating meals high in carbs like pasta and pizza ?5. Scan libre sensor every 8 hours to capture all 24 hours of readings. ?

## 2022-01-27 ENCOUNTER — Ambulatory Visit: Payer: Medicaid Other | Admitting: Gastroenterology

## 2022-01-29 ENCOUNTER — Other Ambulatory Visit: Payer: Self-pay

## 2022-01-29 ENCOUNTER — Encounter (HOSPITAL_COMMUNITY): Payer: Self-pay | Admitting: Emergency Medicine

## 2022-01-29 ENCOUNTER — Emergency Department (HOSPITAL_COMMUNITY)
Admission: EM | Admit: 2022-01-29 | Discharge: 2022-01-30 | Disposition: A | Discharge status: Home / Self Care | Payer: Medicaid Other | Source: Home / Self Care

## 2022-01-29 ENCOUNTER — Emergency Department (HOSPITAL_COMMUNITY): Payer: Medicaid Other

## 2022-01-29 DIAGNOSIS — R1013 Epigastric pain: Secondary | ICD-10-CM | POA: Insufficient documentation

## 2022-01-29 DIAGNOSIS — R1011 Right upper quadrant pain: Secondary | ICD-10-CM | POA: Insufficient documentation

## 2022-01-29 DIAGNOSIS — E119 Type 2 diabetes mellitus without complications: Secondary | ICD-10-CM | POA: Insufficient documentation

## 2022-01-29 DIAGNOSIS — R112 Nausea with vomiting, unspecified: Secondary | ICD-10-CM | POA: Insufficient documentation

## 2022-01-29 DIAGNOSIS — Z5321 Procedure and treatment not carried out due to patient leaving prior to being seen by health care provider: Secondary | ICD-10-CM | POA: Insufficient documentation

## 2022-01-29 LAB — COMPREHENSIVE METABOLIC PANEL
ALT: 16 U/L (ref 0–44)
AST: 20 U/L (ref 15–41)
Albumin: 3.6 g/dL (ref 3.5–5.0)
Alkaline Phosphatase: 101 U/L (ref 38–126)
Anion gap: 10 (ref 5–15)
BUN: 6 mg/dL (ref 6–20)
CO2: 24 mmol/L (ref 22–32)
Calcium: 9.1 mg/dL (ref 8.9–10.3)
Chloride: 101 mmol/L (ref 98–111)
Creatinine, Ser: 0.84 mg/dL (ref 0.61–1.24)
GFR, Estimated: >60 mL/min (ref >60)
Glucose, Bld: 258 mg/dL — ABNORMAL HIGH (ref 70–99)
Potassium: 3.5 mmol/L (ref 3.5–5.1)
Sodium: 135 mmol/L (ref 135–145)
Total Bilirubin: 0.5 mg/dL (ref 0.3–1.2)
Total Protein: 7 g/dL (ref 6.5–8.1)

## 2022-01-29 LAB — CBC WITH DIFFERENTIAL/PLATELET
Abs Immature Granulocytes: 0.01 10*3/uL (ref 0.00–0.07)
Basophils Absolute: 0 10*3/uL (ref 0.0–0.1)
Basophils Relative: 1 %
Eosinophils Absolute: 0.1 10*3/uL (ref 0.0–0.5)
Eosinophils Relative: 3 %
HCT: 35.7 % — ABNORMAL LOW (ref 39.0–52.0)
Hemoglobin: 11.9 g/dL — ABNORMAL LOW (ref 13.0–17.0)
Immature Granulocytes: 0 %
Lymphocytes Relative: 29 %
Lymphs Abs: 1.1 10*3/uL (ref 0.7–4.0)
MCH: 33.5 pg (ref 26.0–34.0)
MCHC: 33.3 g/dL (ref 30.0–36.0)
MCV: 100.6 fL — ABNORMAL HIGH (ref 80.0–100.0)
Monocytes Absolute: 0.4 10*3/uL (ref 0.1–1.0)
Monocytes Relative: 9 %
Neutro Abs: 2.2 10*3/uL (ref 1.7–7.7)
Neutrophils Relative %: 58 %
Platelets: 226 10*3/uL (ref 150–400)
RBC: 3.55 MIL/uL — ABNORMAL LOW (ref 4.22–5.81)
RDW: 12.7 % (ref 11.5–15.5)
WBC: 3.8 10*3/uL — ABNORMAL LOW (ref 4.0–10.5)
nRBC: 0 % (ref 0.0–0.2)

## 2022-01-29 LAB — URINALYSIS, ROUTINE W REFLEX MICROSCOPIC
Bacteria, UA: NONE SEEN
Bilirubin Urine: NEGATIVE
Glucose, UA: 50 mg/dL — AB
Hgb urine dipstick: NEGATIVE
Ketones, ur: NEGATIVE mg/dL
Leukocytes,Ua: NEGATIVE
Nitrite: NEGATIVE
Protein, ur: NEGATIVE mg/dL
Specific Gravity, Urine: 1.021 (ref 1.005–1.030)
pH: 5 (ref 5.0–8.0)

## 2022-01-29 LAB — CBG MONITORING, ED: Glucose-Capillary: 243 mg/dL — ABNORMAL HIGH (ref 70–99)

## 2022-01-29 LAB — LIPASE, BLOOD: Lipase: 64 U/L — ABNORMAL HIGH (ref 11–51)

## 2022-01-29 NOTE — ED Triage Notes (Signed)
Pt arrive by EMS from home for c/o abd pain nausea and vomiting that started today. CBG 270 no hx of DM. ?

## 2022-01-29 NOTE — ED Provider Triage Note (Signed)
Emergency Medicine Provider Triage Evaluation Note ? ?[name] , a 36 y.o. male  was evaluated in triage.  Pt complains of abd pain. Located to epigastric and RUQ. Hx of pancreatitis (due to prior Etoh use) however unsure if feels similar. Having N/V. No diarrhea. Hx of DM. No CP, SOB, cough, dysuria, hematuria. Pain does not radiate into back ? ?Review of Systems  ?Positive: Abd pain. N/V ?Negative: Diarrhea, fever ? ?Physical Exam  ?Ht 6\' 5"  (1.956 m)   Wt 58.8 kg   BMI 15.37 kg/m?  ?Gen:   Awake, no distress   ?Resp:  Normal effort  ?ABD:  Tenderness to epigastric and RUQ ?MSK:   Moves extremities without difficulty  ?Other:   ? ?Medical Decision Making  ?Medically screening exam initiated at 10:30 PM.  Appropriate orders placed.  was informed that the remainder of the evaluation will be completed by another provider, this initial triage assessment does not replace that evaluation, and the importance of remaining in the ED until their evaluation is complete. ? ?Abd pain. N/V ?  ?Franz Svec A, PA-C ?01/29/22 2232 ? ?

## 2022-01-30 ENCOUNTER — Emergency Department (HOSPITAL_COMMUNITY): Payer: Medicaid Other

## 2022-01-30 ENCOUNTER — Inpatient Hospital Stay (HOSPITAL_COMMUNITY)
Admission: EM | Admit: 2022-01-30 | Discharge: 2022-02-01 | DRG: 438 | Disposition: A | Discharge status: Home / Self Care | Payer: Medicaid Other | Attending: Internal Medicine | Admitting: Internal Medicine

## 2022-01-30 ENCOUNTER — Encounter (HOSPITAL_COMMUNITY): Payer: Self-pay | Admitting: Emergency Medicine

## 2022-01-30 DIAGNOSIS — Z8719 Personal history of other diseases of the digestive system: Secondary | ICD-10-CM

## 2022-01-30 DIAGNOSIS — Z794 Long term (current) use of insulin: Secondary | ICD-10-CM

## 2022-01-30 DIAGNOSIS — K8500 Idiopathic acute pancreatitis without necrosis or infection: Principal | ICD-10-CM | POA: Diagnosis present

## 2022-01-30 DIAGNOSIS — F17290 Nicotine dependence, other tobacco product, uncomplicated: Secondary | ICD-10-CM | POA: Diagnosis present

## 2022-01-30 DIAGNOSIS — E1065 Type 1 diabetes mellitus with hyperglycemia: Secondary | ICD-10-CM | POA: Diagnosis present

## 2022-01-30 DIAGNOSIS — K859 Acute pancreatitis without necrosis or infection, unspecified: Secondary | ICD-10-CM | POA: Diagnosis not present

## 2022-01-30 DIAGNOSIS — R112 Nausea with vomiting, unspecified: Secondary | ICD-10-CM | POA: Diagnosis present

## 2022-01-30 DIAGNOSIS — K863 Pseudocyst of pancreas: Secondary | ICD-10-CM | POA: Diagnosis present

## 2022-01-30 DIAGNOSIS — Z833 Family history of diabetes mellitus: Secondary | ICD-10-CM | POA: Diagnosis not present

## 2022-01-30 DIAGNOSIS — F1011 Alcohol abuse, in remission: Secondary | ICD-10-CM | POA: Diagnosis present

## 2022-01-30 DIAGNOSIS — K2970 Gastritis, unspecified, without bleeding: Secondary | ICD-10-CM | POA: Diagnosis present

## 2022-01-30 DIAGNOSIS — Z86718 Personal history of other venous thrombosis and embolism: Secondary | ICD-10-CM | POA: Diagnosis not present

## 2022-01-30 DIAGNOSIS — K8689 Other specified diseases of pancreas: Secondary | ICD-10-CM | POA: Diagnosis not present

## 2022-01-30 DIAGNOSIS — R1013 Epigastric pain: Secondary | ICD-10-CM | POA: Diagnosis not present

## 2022-01-30 DIAGNOSIS — K869 Disease of pancreas, unspecified: Secondary | ICD-10-CM | POA: Diagnosis present

## 2022-01-30 DIAGNOSIS — K861 Other chronic pancreatitis: Secondary | ICD-10-CM | POA: Diagnosis present

## 2022-01-30 DIAGNOSIS — K858 Other acute pancreatitis without necrosis or infection: Secondary | ICD-10-CM

## 2022-01-30 DIAGNOSIS — I81 Portal vein thrombosis: Secondary | ICD-10-CM | POA: Diagnosis present

## 2022-01-30 DIAGNOSIS — Z20822 Contact with and (suspected) exposure to covid-19: Secondary | ICD-10-CM | POA: Diagnosis present

## 2022-01-30 DIAGNOSIS — G8929 Other chronic pain: Secondary | ICD-10-CM

## 2022-01-30 DIAGNOSIS — Z8249 Family history of ischemic heart disease and other diseases of the circulatory system: Secondary | ICD-10-CM | POA: Diagnosis not present

## 2022-01-30 LAB — URINALYSIS, ROUTINE W REFLEX MICROSCOPIC
Bilirubin Urine: NEGATIVE
Glucose, UA: >=500 mg/dL — AB
Hgb urine dipstick: NEGATIVE
Ketones, ur: NEGATIVE mg/dL
Leukocytes,Ua: NEGATIVE
Nitrite: NEGATIVE
Protein, ur: NEGATIVE mg/dL
Specific Gravity, Urine: <1.005 — ABNORMAL LOW (ref 1.005–1.030)
pH: 6 (ref 5.0–8.0)

## 2022-01-30 LAB — CBC
HCT: 38.4 % — ABNORMAL LOW (ref 39.0–52.0)
Hemoglobin: 12.9 g/dL — ABNORMAL LOW (ref 13.0–17.0)
MCH: 33.9 pg (ref 26.0–34.0)
MCHC: 33.6 g/dL (ref 30.0–36.0)
MCV: 100.8 fL — ABNORMAL HIGH (ref 80.0–100.0)
Platelets: 214 10*3/uL (ref 150–400)
RBC: 3.81 MIL/uL — ABNORMAL LOW (ref 4.22–5.81)
RDW: 12.7 % (ref 11.5–15.5)
WBC: 4.9 10*3/uL (ref 4.0–10.5)
nRBC: 0 % (ref 0.0–0.2)

## 2022-01-30 LAB — URINALYSIS, MICROSCOPIC (REFLEX)
Bacteria, UA: NONE SEEN
RBC / HPF: NONE SEEN RBC/hpf (ref 0–5)
WBC, UA: NONE SEEN WBC/hpf (ref 0–5)

## 2022-01-30 LAB — RESP PANEL BY RT-PCR (FLU A&B, COVID) ARPGX2
Influenza A by PCR: NEGATIVE
Influenza B by PCR: NEGATIVE
SARS Coronavirus 2 by RT PCR: NEGATIVE

## 2022-01-30 LAB — COMPREHENSIVE METABOLIC PANEL
ALT: 18 U/L (ref 0–44)
AST: 22 U/L (ref 15–41)
Albumin: 4 g/dL (ref 3.5–5.0)
Alkaline Phosphatase: 99 U/L (ref 38–126)
Anion gap: 9 (ref 5–15)
BUN: 9 mg/dL (ref 6–20)
CO2: 25 mmol/L (ref 22–32)
Calcium: 9.1 mg/dL (ref 8.9–10.3)
Chloride: 99 mmol/L (ref 98–111)
Creatinine, Ser: 0.99 mg/dL (ref 0.61–1.24)
GFR, Estimated: >60 mL/min (ref >60)
Glucose, Bld: 335 mg/dL — ABNORMAL HIGH (ref 70–99)
Potassium: 4.1 mmol/L (ref 3.5–5.1)
Sodium: 133 mmol/L — ABNORMAL LOW (ref 135–145)
Total Bilirubin: 0.6 mg/dL (ref 0.3–1.2)
Total Protein: 7.8 g/dL (ref 6.5–8.1)

## 2022-01-30 LAB — GLUCOSE, CAPILLARY
Glucose-Capillary: 196 mg/dL — ABNORMAL HIGH (ref 70–99)
Glucose-Capillary: 232 mg/dL — ABNORMAL HIGH (ref 70–99)

## 2022-01-30 LAB — LIPASE, BLOOD: Lipase: 66 U/L — ABNORMAL HIGH (ref 11–51)

## 2022-01-30 MED ORDER — SODIUM CHLORIDE 0.9 % IV BOLUS
1000.0000 mL | Freq: Once | INTRAVENOUS | Status: AC
  Administered 2022-01-30: 1000 mL via INTRAVENOUS

## 2022-01-30 MED ORDER — ONDANSETRON HCL 4 MG PO TABS: 4.0000 mg | ORAL_TABLET | Freq: Four times a day (QID) | ORAL | Status: DC | PRN

## 2022-01-30 MED ORDER — HYDROMORPHONE HCL 1 MG/ML IJ SOLN
1.0000 mg | Freq: Once | INTRAMUSCULAR | Status: AC
  Administered 2022-01-30: 1 mg via INTRAVENOUS
  Filled 2022-01-30: qty 1

## 2022-01-30 MED ORDER — IOHEXOL 300 MG/ML  SOLN
100.0000 mL | Freq: Once | INTRAMUSCULAR | Status: AC | PRN
  Administered 2022-01-30: 100 mL via INTRAVENOUS

## 2022-01-30 MED ORDER — ONDANSETRON HCL 4 MG/2ML IJ SOLN
4.0000 mg | Freq: Four times a day (QID) | INTRAMUSCULAR | Status: DC | PRN
Start: 2022-01-30 — End: 2022-02-01

## 2022-01-30 MED ORDER — INSULIN ASPART 100 UNIT/ML IJ SOLN
0.0000 [IU] | INTRAMUSCULAR | Status: DC
  Administered 2022-01-30: 2 [IU] via SUBCUTANEOUS
  Administered 2022-01-30: 3 [IU] via SUBCUTANEOUS
  Administered 2022-01-31: 7 [IU] via SUBCUTANEOUS
  Administered 2022-01-31 – 2022-02-01 (×4): 2 [IU] via SUBCUTANEOUS
  Administered 2022-02-01: 7 [IU] via SUBCUTANEOUS
  Filled 2022-01-30: qty 0.09

## 2022-01-30 MED ORDER — ONDANSETRON HCL 4 MG/2ML IJ SOLN
4.0000 mg | Freq: Once | INTRAMUSCULAR | Status: AC
  Administered 2022-01-30: 4 mg via INTRAVENOUS
  Filled 2022-01-30: qty 2

## 2022-01-30 MED ORDER — OXYCODONE HCL 5 MG PO TABS
5.0000 mg | ORAL_TABLET | ORAL | Status: DC | PRN
  Administered 2022-01-30 – 2022-02-01 (×5): 5 mg via ORAL
  Filled 2022-01-30 (×5): qty 1

## 2022-01-30 MED ORDER — SODIUM CHLORIDE 0.9 % IV SOLN: Freq: Once | INTRAVENOUS | Status: DC

## 2022-01-30 MED ORDER — PANTOPRAZOLE SODIUM 40 MG IV SOLR
40.0000 mg | INTRAVENOUS | Status: DC
  Administered 2022-01-30: 40 mg via INTRAVENOUS
  Filled 2022-01-30: qty 10

## 2022-01-30 MED ORDER — LACTATED RINGERS IV SOLN: INTRAVENOUS | Status: DC

## 2022-01-30 MED ORDER — MORPHINE SULFATE (PF) 2 MG/ML IV SOLN
2.0000 mg | INTRAVENOUS | Status: DC | PRN
  Administered 2022-01-30 – 2022-01-31 (×5): 2 mg via INTRAVENOUS
  Filled 2022-01-30 (×5): qty 1

## 2022-01-30 MED ORDER — ENOXAPARIN SODIUM 60 MG/0.6ML IJ SOSY
1.0000 mg/kg | PREFILLED_SYRINGE | Freq: Two times a day (BID) | INTRAMUSCULAR | Status: DC
  Administered 2022-01-30 – 2022-02-01 (×4): 60 mg via SUBCUTANEOUS
  Filled 2022-01-30 (×5): qty 0.6

## 2022-01-30 NOTE — ED Provider Notes (Signed)
?[facility] DEPT ?Provider Note ? ? ?CSN: [id number] ?Arrival date & time: 01/30/22  1257 ? ?  ? ?History ? ?Chief Complaint  ?Patient presents with  ? Abdominal Pain  ? Emesis  ? Diarrhea  ? ? ?Carlos [name] is a 36 y.o. male. ? ?The history is provided by the patient.  ?Abdominal Pain ?Pain location:  Generalized ?Pain quality: aching   ?Pain radiates to:  Does not radiate ?Pain severity:  Moderate ?Onset quality:  Gradual ?Duration:  4 days ?Timing:  Intermittent ?Progression:  Waxing and waning ?Chronicity:  Recurrent ?Context: not alcohol use   ?Relieved by:  Nothing ?Worsened by:  Nothing ?Associated symptoms: diarrhea, nausea and vomiting   ?Associated symptoms: no anorexia, no belching, no chest pain, no chills, no constipation, no cough, no dysuria, no fatigue, no fever, no flatus, no hematuria, no melena, no shortness of breath and no sore throat   ?Risk factors comment:  Hx of chronic pancreatitis, former heavy ETOH use ? ?  ? ?Home Medications ?Prior to Admission medications   ?Medication Sig Start Date End Date Taking? Authorizing Provider  ?blood glucose meter kit and supplies KIT Dispense based on patient and insurance preference. Use up to four times daily as directed. 09/16/21   Alma Friendly, MD  ?Continuous Blood Gluc Sensor (FREESTYLE LIBRE 2 SENSOR) MISC Use as directed. 12/22/21   Manuella Ghazi, Pratik D, DO  ?HYDROmorphone (DILAUDID) 4 MG tablet Take 1 tablet (4 mg total) by mouth every 6 (six) hours as needed for severe pain. 12/31/21   [name], Dinah C, NP  ?insulin aspart (NOVOLOG) 100 UNIT/ML FlexPen Inject 3 Units into the skin 3 (three) times daily with meals. 12/22/21   Manuella Ghazi, Pratik D, DO  ?insulin glargine (LANTUS) 100 UNIT/ML Solostar Pen Inject 16 Units into the skin daily. 12/22/21   Heath Lark D, DO  ?Insulin Pen Needle (PEN NEEDLES 3/16") 31G X 5 MM MISC Use as directed with insulin pen 09/16/21   Alma Friendly, MD  ?lipase/protease/amylase (CREON) 36000  UNITS CPEP capsule Take 2 capsules (72,000 Units total) by mouth 3 (three) times daily with meals. May also take 1 capsule (36,000 Units total) as needed (with snacks). 01/01/22   [name], Telford Nab., MD  ?ondansetron (ZOFRAN) 8 MG tablet Take 1 tablet (8 mg total) by mouth every 8 (eight) hours as needed for nausea or vomiting. 12/02/21   [name], Telford Nab., MD  ?   ? ?Allergies    ?Other   ? ?Review of Systems   ?Review of Systems  ?Constitutional:  Negative for chills, fatigue and fever.  ?HENT:  Negative for sore throat.   ?Respiratory:  Negative for cough and shortness of breath.   ?Cardiovascular:  Negative for chest pain.  ?Gastrointestinal:  Positive for diarrhea, nausea and vomiting. Negative for anorexia, constipation, flatus and melena.  ?Genitourinary:  Negative for dysuria and hematuria.  ? ?Physical Exam ?Updated Vital Signs ? ?ED Triage Vitals  ?Enc Vitals Group  ?   BP 01/30/22 1307 (!) 135/92  ?   Pulse Rate 01/30/22 1307 90  ?   Resp 01/30/22 1307 18  ?   Temp 01/30/22 1307 98.1 ?F (36.7 ?C)  ?   Temp Source 01/30/22 1307 Oral  ?   SpO2 01/30/22 1307 100 %  ?   Weight --   ?   Height --   ?   Head Circumference --   ?   Peak Flow --   ?  Pain Score 01/30/22 1308 8  ?   Pain Loc --   ?   Pain Edu? --   ?   Excl. in [place]? --   ? ? ?Physical Exam ?Vitals and nursing note reviewed.  ?Constitutional:   ?   General: He is not in acute distress. ?   Appearance: He is well-developed. He is not ill-appearing.  ?HENT:  ?   Head: Normocephalic and atraumatic.  ?   Mouth/Throat:  ?   Mouth: Mucous membranes are moist.  ?Eyes:  ?   Extraocular Movements: Extraocular movements intact.  ?   Conjunctiva/sclera: Conjunctivae normal.  ?Cardiovascular:  ?   Rate and Rhythm: Normal rate and regular rhythm.  ?   Heart sounds: No murmur heard. ?Pulmonary:  ?   Effort: Pulmonary effort is normal. No respiratory distress.  ?   Breath sounds: Normal breath sounds.  ?Abdominal:  ?   Palpations: Abdomen is soft.  ?    Tenderness: There is generalized abdominal tenderness. There is no guarding.  ?   Hernia: No hernia is present.  ?Musculoskeletal:     ?   General: No swelling.  ?   Cervical back: Neck supple.  ?Skin: ?   General: Skin is warm and dry.  ?   Capillary Refill: Capillary refill takes less than 2 seconds.  ?Neurological:  ?   General: No focal deficit present.  ?   Mental Status: He is alert.  ?Psychiatric:     ?   Mood and Affect: Mood normal.  ? ? ?ED Results / Procedures / Treatments   ?Labs ?(all labs ordered are listed, but only abnormal results are displayed) ?Labs Reviewed  ?LIPASE, BLOOD - Abnormal; Notable for the following components:  ?    Result Value  ? Lipase 66 (*)   ? All other components within normal limits  ?COMPREHENSIVE METABOLIC PANEL - Abnormal; Notable for the following components:  ? Sodium 133 (*)   ? Glucose, Bld 335 (*)   ? All other components within normal limits  ?CBC - Abnormal; Notable for the following components:  ? RBC 3.81 (*)   ? Hemoglobin 12.9 (*)   ? HCT 38.4 (*)   ? MCV 100.8 (*)   ? All other components within normal limits  ?RESP PANEL BY RT-PCR (FLU A&B, COVID) ARPGX2  ?GASTROINTESTINAL PANEL BY PCR, STOOL (REPLACES STOOL CULTURE)  ?URINALYSIS, ROUTINE W REFLEX MICROSCOPIC  ?LIPASE, BLOOD  ?COMPREHENSIVE METABOLIC PANEL  ?CBC  ?PROTIME-INR  ? ? ?EKG ?EKG Interpretation ? ?Date/Time:  Saturday January 30 2022 13:11:18 EDT ?Ventricular Rate:  88 ?PR Interval:  121 ?QRS Duration: 87 ?QT Interval:  388 ?QTC Calculation: 470 ?R Axis:   87 ?Text Interpretation: Sinus rhythm Right atrial enlargement Confirmed by Lennice Sites [phone]) on 01/30/2022 3:15:07 PM ? ?Radiology ?CT ABDOMEN PELVIS W CONTRAST ? ?Result Date: 01/30/2022 ?CLINICAL DATA:  Epigastric pain for 3 days. EXAM: CT ABDOMEN AND PELVIS WITH CONTRAST TECHNIQUE: Multidetector CT imaging of the abdomen and pelvis was performed using the standard protocol following bolus administration of intravenous contrast. RADIATION DOSE  REDUCTION: This exam was performed according to the departmental dose-optimization program which includes automated exposure control, adjustment of the mA and/or kV according to patient size and/or use of iterative reconstruction technique. CONTRAST:  157m OMNIPAQUE IOHEXOL 300 MG/ML  SOLN COMPARISON:  CT abdomen pelvis dated 12/07/2021. FINDINGS: Lower chest: No acute abnormality. Hepatobiliary: No focal liver abnormality is seen. No gallstones, gallbladder wall thickening, or biliary  dilatation. Pancreas: The pancreas is enlarged and edematous with areas of calcifications, consistent with acute on chronic pancreatitis. Multiple areas of hypoattenuation within the pancreas likely represent pseudocysts. These measure up to 2.7 cm in the pancreatic body (series 2, image 18 and 1.9 cm in the pancreatic tail (series 4, image 60). No definite pancreatic necrosis is identified. Spleen: Normal in size without focal abnormality. Adrenals/Urinary Tract: Adrenal glands are unremarkable. Kidneys are normal, without renal calculi, focal lesion, or hydronephrosis. Bladder is unremarkable. Stomach/Bowel: Stomach is within normal limits. No pericecal inflammatory changes are noted to suggest acute appendicitis. No evidence of bowel wall thickening, distention, or inflammatory changes. Vascular/Lymphatic: A nonocclusive filling defect in the portal vein is most consistent with venous thrombus (series 2, image 20 and series 4 images 50-52). No abdominal or pelvic lymphadenopathy is identified. Reproductive: Prostate is unremarkable. Other: No abdominal wall hernia or abnormality. No abdominopelvic ascites. Musculoskeletal: No acute or significant osseous findings. IMPRESSION: 1. Findings most consistent with acute interstitial edematous pancreatitis superimposed on chronic pancreatitis. Peripancreatic fluid collections are most consistent with pseudocysts. 2. Nonocclusive thrombus in the portal vein is likely secondary to  pancreatitis. Electronically Signed   By: Zerita Boers M.D.   On: 01/30/2022 16:11   ? ?Procedures ?Procedures  ? ? ?Medications Ordered in ED ?Medications  ?insulin aspart (novoLOG) injection 0-9 Units (has no

## 2022-01-30 NOTE — H&P (Signed)
?History and Physical  ? ? ?Carlos [name] KDT:[id number] DOB: 06-26-1986 DOA: 01/30/2022 ? ?PCP: [name], MD  ? ?Patient coming from:  ? ?Chief Complaint  ?Patient presents with  ? Abdominal Pain  ? Emesis  ? Diarrhea  ?   ? 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  Patient also complains of epigastric pain with radiation to right upper quadrant and right flank.  He has been having high blood sugar in 300s.  No report of chest pain fever chills headache focal weakness, hemoptysis melena.  Having watery stool. ?In the ED, vitals fairly stable, afebrile, labs shows uncontrolled hyperglycemia 335, normal anion gap and normal urine lites/renal function, lipase 66, CBC stable.  Patient was also seen in the ED for same reason 3/24.  Underwent CT abdomen pelvis with contrast that showed acute interstitial edematous pancreatitis superimposed on chronic pancreatitis, peripancreatic fluid collection most consistent with pseudocyst, nonocclusive thrombus in the portal vein likely secondary to pancreatitis.  Patient reports that he was previously on Eliquis for a month and was started by his GI physician for procedure/EGD/stent ?-Reports he has not been back on anticoagulation since then,he believes it is since February. GI and heme-onc consulted and admission requested for further management.  Dr. Chryl Heck from hematology advised Lovenox therapeutic dose while inpatient and upon discharge DOAC x3 months. ?  ? ?Assessment/Plan ?Principal Problem: ?  Acute pancreatitis ?Active Problems: ?  Pancreatic pseudocyst ?  History of pancreatitis ?  Uncontrolled type 1 diabetes mellitus with hyperglycemia, with long-term current use of insulin ([name]) ?  Nausea vomiting and diarrhea ?  PVT (portal vein thrombosis) ? ? ?Acute  interstitial edematous pancreatitis ?Chronic pancreatitis ?Pancreatic pseudocysts: ?We will admit the patient keep on aggressive IV fluid hydration.  Prior history of alcohol abuse but currently denies.  Electrolytes stable lipase borderline.  GI is being consulted.  Clear liquid diet as tolerated and advance.  Pain management with IV oral opiates, antiemetics, PPI ? ?Nausea vomiting and diarrhea: In the setting of 1, rule out other etiologies, obtain GI panel-Place in enteric precaution until then.  Symptomatic management. ? ?Uncontrolled type 1 diabetes mellitus with uncontrolled hyperglycemia: Keep on sliding scale insulin while p.o. intake is minimal, hopefully resume home insulin dose slowly.  Check A1c. ? 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  GI/hem onc consulted -Dr. Chryl Heck from hematology advised Lovenox therapeutic dose while inpatient and upon discharge DOAC x3 months.  Patient was previously on Eliquis it appears it was stopped for procedure and never resumed. ? ?Prior history of alcohol abuse, denies current use - last etoh use 2 and half years ago ?Mild hyponatremia which is pseudohyponatremia ? ?There is no height or weight on file to calculate BMI.  ? ?Severity of Illness: ?The appropriate patient status for this patient is OBSERVATION. Observation status is judged to be reasonable and necessary in order to provide the required intensity of service to ensure the patient's safety. The patient's presenting symptoms, physical exam findings, and initial radiographic and laboratory data in the context of their medical condition is felt to place them at decreased risk for further clinical deterioration. Furthermore, it is anticipated that the patient will be medically stable for discharge  from the hospital within 2 midnights of admission.   ? ?DVT prophylaxis: SCDs Start: 01/30/22 1717 ? Code Status:    Code Status: Full Code  ?Family Communication: Admission, patients condition and plan of care including tests being ordered have been discussed with the patient  who indicate understanding and agree with the plan and Code Status. ? ?Consults called:  ?GI ? ?Review of Systems: All systems were reviewed and were negative except as mentioned in HPI above. ?Negative for fever ?Negative for chest pain ?Negative for shortness of breath ? ?Past Medical History:  ?Diagnosis Date  ? AKI (acute kidney injury) ([place]) 12/21/2021  ? Anxiety   ? Diabetes ([place])   ? Pancreatitis   ? Splenic vein thrombosis   ? ? ?Past Surgical History:  ?Procedure Laterality Date  ? BIOPSY  12/02/2021  ? Procedure: BIOPSY;  Surgeon: Irving Copas., MD;  Location: [facility];  Service: Gastroenterology;;  ? ESOPHAGOGASTRODUODENOSCOPY (EGD) WITH PROPOFOL N/A 12/02/2021  ? Procedure: ESOPHAGOGASTRODUODENOSCOPY (EGD) WITH PROPOFOL;  Surgeon: Rush Landmark Telford Nab., MD;  Location: [facility];  Service: Gastroenterology;  Laterality: N/A;  ? NO PAST SURGERIES    ? UPPER ESOPHAGEAL ENDOSCOPIC ULTRASOUND (EUS) N/A 12/02/2021  ? Procedure: UPPER ESOPHAGEAL ENDOSCOPIC ULTRASOUND (EUS);  Surgeon: Irving Copas., MD;  Location: [facility];  Service: Gastroenterology;  Laterality: N/A;  ? ? ? reports that he has been smoking cigars. He started smoking about 19 years ago. He has never used smokeless tobacco. He reports that he does not currently use alcohol. He reports that he does not use drugs. ? ?Allergies  ?Allergen Reactions  ? Other Anaphylaxis  ?  [name] syrup  ? ? ?Family History  ?Problem Relation Age of Onset  ? Hypertension Mother   ? Alcoholism Maternal Uncle   ? Diabetes Maternal Grandmother   ? Diabetes Maternal Grandfather   ? Diabetes Paternal Grandmother   ? Diabetes Paternal Grandfather   ? Colon cancer Neg Hx   ? Esophageal cancer Neg Hx   ? Inflammatory bowel disease Neg Hx   ? Liver disease Neg Hx   ? Pancreatic  cancer Neg Hx   ? Rectal cancer Neg Hx   ? Stomach cancer Neg Hx   ? ? ? ?Prior to Admission medications   ?Medication Sig Start Date End Date Taking? Authorizing Provider  ?blood glucose meter kit and supplies KIT Dispense based on patient and insurance preference. Use up to four times daily as directed. 09/16/21   Alma Friendly, MD  ?Continuous Blood Gluc Sensor (FREESTYLE LIBRE 2 SENSOR) MISC Use as directed. 12/22/21   Manuella Ghazi, Pratik D, DO  ?HYDROmorphone (DILAUDID) 4 MG tablet Take 1 tablet (4 mg total) by mouth every 6 (six) hours as needed for severe pain. 12/31/21   [name], Dinah C, NP  ?insulin aspart (NOVOLOG) 100 UNIT/ML FlexPen Inject 3 Units into the skin 3 (three) times daily with meals. 12/22/21   Manuella Ghazi, Pratik D, DO  ?insulin glargine (LANTUS) 100 UNIT/ML Solostar Pen Inject 16 Units into the skin daily. 12/22/21   Heath Lark D, DO  ?Insulin Pen Needle (PEN NEEDLES 3/16") 31G X 5 MM MISC Use as directed with insulin pen 09/16/21   Alma Friendly, MD  ?lipase/protease/amylase (CREON) 36000 UNITS CPEP capsule Take 2 capsules (72,000 Units total) by mouth 3 (three) times daily with meals. May also take 1 capsule (36,000 Units total) as needed (with snacks). 01/01/22   [name], Telford Nab., MD  ?ondansetron (ZOFRAN) 8 MG tablet  Take 1 tablet (8 mg total) by mouth every 8 (eight) hours as needed for nausea or vomiting. 12/02/21   [name], Telford Nab., MD  ? ? ?Physical Exam: ?Vitals:  ? 01/30/22 1307 01/30/22 1500 01/30/22 1658 01/30/22 1700  ?BP: (!) 135/92 (!) 126/94  (!) 135/92  ?Pulse: 90 85  (!) 59  ?Resp: 18 (!) 22  18  ?Temp: 98.1 ?F (36.7 ?C)  98.7 ?F (37.1 ?C)   ?TempSrc: Oral  Oral   ?SpO2: 100% 100%  100%  ? ? ?General exam: AAO X3, Thin, NAD, weak appearing. ?HEENT:Oral mucosa moist, Ear/Nose WNL grossly, dentition normal. ?Respiratory system: bilaterally CLEAR,no wheezing or crackles,no use of accessory muscle ?Cardiovascular system: S1 & S2 +, No JVD,. ?Gastrointestinal system:  Abdomen soft, generalized tenderness with guarding,ND, BS+ ?Nervous System:Alert, awake, moving extremities and grossly nonfocal ?Extremities: No edema, distal peripheral pulses palpable.  ?Skin: No rashes,no ic

## 2022-01-30 NOTE — Hospital Course (Addendum)
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  Patient also complains of epigastric pain with radiation to right upper quadrant and right flank.  He has been having high blood sugar in 300s.  No report of chest pain fever chills headache focal weakness, hemoptysis melena.  Having watery stool. ?In the ED, vitals fairly stable, afebrile, labs shows uncontrolled hyperglycemia 335, normal anion gap and normal urine lites/renal function, lipase 66, CBC stable.  Patient was also seen in the ED for same reason 3/24.  Underwent CT abdomen pelvis with contrast that showed acute interstitial edematous pancreatitis superimposed on chronic pancreatitis, peripancreatic fluid collection most consistent with pseudocyst, nonocclusive thrombus in the portal vein likely secondary to pancreatitis.  Patient reports that he was previously on Eliquis for a month and was started by his GI physician for procedure/EGD/stent ?-Reports he has not been back on anticoagulation since then,he believes it is since February. GI and heme-onc consulted and admission requested for further management.  Diet was slowly advanced at this time tolerating diet today did well on soft diet no more diarrhea no nausea vomiting.  Blood sugar fairly stable he will resume his insulin regimen at this oral intake picks up.  Seen by GI they will follow-up as outpatient.  Patient will continue anticoagulation x3 months he already has Eliquis supplies at home we will prescribe him his anticoagulants.  He is requesting sleep aid for discharge. ? ?

## 2022-01-30 NOTE — ED Triage Notes (Signed)
Reports epigastric pain x3 days, N/V/D. Blood sugars in the 200s. Pain radiates to RUQ and R flank.  ?

## 2022-01-30 NOTE — ED Provider Triage Note (Signed)
Emergency Medicine Provider Triage Evaluation Note ? ?Carlos [name] , a 36 y.o. male  was evaluated in triage.  Pt complains of abdominal pain.  Pt reports he has pancreatitis.  Pt is diabetic.  He is on insulin  ? ?Review of Systems  ?Positive: nausea ?Negative: diarrhea ? ?Physical Exam  ?BP (!) 135/92 (BP Location: Left Arm)   Pulse 90   Temp 98.1 ?F (36.7 ?C) (Oral)   Resp 18   SpO2 100%  ?Gen:   Awake, no distress   ?Resp:  Normal effort  ?MSK:   Moves extremities without difficulty  ?Other:  Abdomen diffusely tender  ? ?Medical Decision Making  ?Medically screening exam initiated at 1:20 PM.  Appropriate orders placed.  Carlos [name] was informed that the remainder of the evaluation will be completed by another provider, this initial triage assessment does not replace that evaluation, and the importance of remaining in the ED until their evaluation is complete. ? ? ?  ?Fransico Meadow, [place] ?01/30/22 1322 ? ?

## 2022-01-30 NOTE — Progress Notes (Addendum)
ANTICOAGULATION CONSULT NOTE - Initial Consult ? ?Pharmacy Consult for Lovenox ?Indication: DVT ? ?Allergies  ?Allergen Reactions  ? Other Anaphylaxis  ?  [name] syrup  ? ? ?Patient Measurements: ?  ?Heparin Dosing Weight: 58.8 kg ? ?Vital Signs: ?Temp: 98.7 ?F (37.1 ?C) (03/25 1658) ?Temp Source: Oral (03/25 1658) ?BP: 135/92 (03/25 1700) ?Pulse Rate: 59 (03/25 1700) ? ?Labs: ?Recent Labs  ?  01/29/22 ?2244 01/30/22 ?1346  ?HGB 11.9* 12.9*  ?HCT 35.7* 38.4*  ?PLT 226 214  ?CREATININE 0.84 0.99  ? ? ?Estimated Creatinine Clearance: 85.8 mL/min (by C-G formula based on SCr of 0.99 mg/dL). ? ? ?Medical History: ?Past Medical History:  ?Diagnosis Date  ? AKI (acute kidney injury) (HCC) 12/21/2021  ? Anxiety   ? Diabetes (HCC)   ? Pancreatitis   ? Splenic vein thrombosis   ? ? ?Medications:  ?Scheduled:  ? insulin aspart  0-9 Units Subcutaneous Q4H  ? pantoprazole (PROTONIX) IV  40 mg Intravenous Q24H  ? ? ?Assessment: ?36 yo male presenting with abdominal pain, nausea, vomiting, and diarrhea.  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  Per chart review, hematology recommended to d/c anticoagulation in November 2022 due to high risk of bleeding from varices.   ? ?3/25 CT abdomen/pelvis showed non-occlusive portal vein thrombosis.  EDP discussed with hematology/oncology who recommended therapeutic Lovenox while inpatient with plans to transition to a DOAC at discharge x 3 months. ? ?Scr <1, CBC appears to be near baseline and stable. No reports of bleeding. Baseline INR ordered. ? ?Goal of Therapy:  ?Anti-Xa level 0.6-1 units/ml 4hrs after LMWH dose given ?Monitor platelets by anticoagulation protocol: Yes ?  ?Plan:  ?Lovenox 1mg /kg (60mg ) [place] q12 hours ?Monitor CBC at least q72h, s/sx bleeding ?F/u ability to transition to DOAC ? ? , PharmD ?01/30/2022 5:25 PM ? ? ?

## 2022-01-30 NOTE — ED Notes (Signed)
Pt called multiple times for vitals,mse and ultrasound with no response ?

## 2022-01-31 DIAGNOSIS — K859 Acute pancreatitis without necrosis or infection, unspecified: Secondary | ICD-10-CM | POA: Diagnosis not present

## 2022-01-31 DIAGNOSIS — I81 Portal vein thrombosis: Secondary | ICD-10-CM | POA: Diagnosis not present

## 2022-01-31 DIAGNOSIS — K858 Other acute pancreatitis without necrosis or infection: Secondary | ICD-10-CM | POA: Diagnosis not present

## 2022-01-31 DIAGNOSIS — R1013 Epigastric pain: Secondary | ICD-10-CM

## 2022-01-31 DIAGNOSIS — G8929 Other chronic pain: Secondary | ICD-10-CM

## 2022-01-31 LAB — GASTROINTESTINAL PANEL BY PCR, STOOL (REPLACES STOOL CULTURE)

## 2022-01-31 LAB — LIPASE, BLOOD: Lipase: 65 U/L — ABNORMAL HIGH (ref 11–51)

## 2022-01-31 LAB — COMPREHENSIVE METABOLIC PANEL
ALT: 15 U/L (ref 0–44)
AST: 15 U/L (ref 15–41)
Albumin: 3.1 g/dL — ABNORMAL LOW (ref 3.5–5.0)
Alkaline Phosphatase: 89 U/L (ref 38–126)
Anion gap: 7 (ref 5–15)
BUN: 8 mg/dL (ref 6–20)
CO2: 24 mmol/L (ref 22–32)
Calcium: 8.5 mg/dL — ABNORMAL LOW (ref 8.9–10.3)
Chloride: 101 mmol/L (ref 98–111)
Creatinine, Ser: 0.91 mg/dL (ref 0.61–1.24)
GFR, Estimated: >60 mL/min (ref >60)
Glucose, Bld: 288 mg/dL — ABNORMAL HIGH (ref 70–99)
Potassium: 3.5 mmol/L (ref 3.5–5.1)
Sodium: 132 mmol/L — ABNORMAL LOW (ref 135–145)
Total Bilirubin: 0.5 mg/dL (ref 0.3–1.2)
Total Protein: 6.3 g/dL — ABNORMAL LOW (ref 6.5–8.1)

## 2022-01-31 LAB — CBC
HCT: 34.8 % — ABNORMAL LOW (ref 39.0–52.0)
Hemoglobin: 11.4 g/dL — ABNORMAL LOW (ref 13.0–17.0)
MCH: 32.7 pg (ref 26.0–34.0)
MCHC: 32.8 g/dL (ref 30.0–36.0)
MCV: 99.7 fL (ref 80.0–100.0)
Platelets: 173 10*3/uL (ref 150–400)
RBC: 3.49 MIL/uL — ABNORMAL LOW (ref 4.22–5.81)
RDW: 12.4 % (ref 11.5–15.5)
WBC: 4.6 10*3/uL (ref 4.0–10.5)
nRBC: 0 % (ref 0.0–0.2)

## 2022-01-31 LAB — GLUCOSE, CAPILLARY
Glucose-Capillary: 178 mg/dL — ABNORMAL HIGH (ref 70–99)
Glucose-Capillary: 182 mg/dL — ABNORMAL HIGH (ref 70–99)
Glucose-Capillary: 193 mg/dL — ABNORMAL HIGH (ref 70–99)
Glucose-Capillary: 311 mg/dL — ABNORMAL HIGH (ref 70–99)
Glucose-Capillary: 331 mg/dL — ABNORMAL HIGH (ref 70–99)
Glucose-Capillary: 70 mg/dL (ref 70–99)

## 2022-01-31 LAB — PROTIME-INR
INR: 1 (ref 0.8–1.2)
Prothrombin Time: 13.3 seconds (ref 11.4–15.2)

## 2022-01-31 MED ORDER — PANCRELIPASE (LIP-PROT-AMYL) 12000-38000 UNITS PO CPEP: 36000.0000 [IU] | ORAL_CAPSULE | Freq: Three times a day (TID) | ORAL | Status: DC

## 2022-01-31 MED ORDER — PANCRELIPASE (LIP-PROT-AMYL) 36000-114000 UNITS PO CPEP
36000.0000 [IU] | ORAL_CAPSULE | ORAL | Status: DC
  Administered 2022-01-31 – 2022-02-01 (×2): 36000 [IU] via ORAL
  Filled 2022-01-31: qty 1

## 2022-01-31 MED ORDER — ADULT MULTIVITAMIN W/MINERALS CH
1.0000 | ORAL_TABLET | Freq: Every day | ORAL | Status: DC
  Administered 2022-01-31 – 2022-02-01 (×2): 1 via ORAL
  Filled 2022-01-31 (×2): qty 1

## 2022-01-31 MED ORDER — PANCRELIPASE (LIP-PROT-AMYL) 36000-114000 UNITS PO CPEP
72000.0000 [IU] | ORAL_CAPSULE | Freq: Three times a day (TID) | ORAL | Status: DC
  Administered 2022-01-31 – 2022-02-01 (×3): 72000 [IU] via ORAL
  Filled 2022-01-31 (×5): qty 2

## 2022-01-31 MED ORDER — HYDROMORPHONE HCL 2 MG PO TABS
4.0000 mg | ORAL_TABLET | Freq: Four times a day (QID) | ORAL | Status: DC | PRN
  Administered 2022-01-31 – 2022-02-01 (×3): 4 mg via ORAL
  Filled 2022-01-31 (×3): qty 2

## 2022-01-31 MED ORDER — PANTOPRAZOLE SODIUM 40 MG PO TBEC
40.0000 mg | DELAYED_RELEASE_TABLET | Freq: Two times a day (BID) | ORAL | Status: DC
  Administered 2022-01-31 – 2022-02-01 (×2): 40 mg via ORAL
  Filled 2022-01-31 (×3): qty 1

## 2022-01-31 MED ORDER — INSULIN DETEMIR 100 UNIT/ML ~~LOC~~ SOLN
5.0000 [IU] | Freq: Every day | SUBCUTANEOUS | Status: DC
  Administered 2022-01-31 – 2022-02-01 (×2): 5 [IU] via SUBCUTANEOUS
  Filled 2022-01-31 (×2): qty 0.05

## 2022-01-31 NOTE — Consult Note (Signed)
? ?Referring Provider: Dr. Lupita Leash, [facility] ?Primary Care Physician:  [name], MD ?Primary Gastroenterologist:  Dr. Rush Landmark ? ?Reason for Consultation:   Acute on chronic pancreatitis ? ?HPI: Carlos [name] is a 36 y.o. male with past medical history significant for pancreatitis likely alcohol induced with complications of pseudocyst, splenic vein thrombosis with collateralization noted previously on eliquis, new onset diabetes, and anxiety who presented to [facility] ED for complaints of abdominal pain. ?Patient is well-known to Dr. Rush Landmark status post EGD/EUS for pancreatic pseudocyst on 12/02/2021.  Patient was found to have grade D erosive esophagitis--pathology showed Candida, 1 cm hiatal hernia, gastritis negative H. pylori any, no dysplasia..  EUS showed chronic pancreatitis with parenchymal loss concerning for significant necrosis causing cystgastrostomy/enterostomy to be unsafe to perform.  ?He was sent home on omeprazole 40 mg twice daily, Carafate slurry twice daily for esophageal healing, Zofran, oxycodone, and repeat CT abdomen pancreas protocol.  He tells me that essentially since his EUS in January he has not been able to get his abdominal pain under control again.  He has had about a constant 7 or 8 out of 10 pain that goes to the right side.  He also complains of ongoing hoarseness since that procedure.  He tells me that he has been taking his Creon and has been feeling better with his ability to eat.  Pain does worsen somewhat after eating, but overall it is just constant.  He is taking his omeprazole as well.  He never resumed his Eliquis after his EUS in January as he says that he was never instructed on doing so.  Labs look ok.  Lipase 65 and LFTs normal. ? ?CT scan of the abdomen and pelvis with contrast: ? ?IMPRESSION: ?1. Findings most consistent with acute interstitial edematous ?pancreatitis superimposed on chronic pancreatitis. Peripancreatic ?fluid collections are most consistent with  pseudocysts. ?2. Nonocclusive thrombus in the portal vein is likely secondary to ?pancreatitis. ? ? ?EGD/EUS 12/02/2021 ?EGD Impression: ?- White nummular lesions in esophageal mucosa. Biopsied. ?- LA Grade D erosive esophagitis with no bleeding distally. ?- Z-line irregular, 45 cm from the incisors. ?- 1 cm hiatal hernia. ?- Angulation deformities in the entire stomach. ?- Erythematous mucosa in the stomach. Biopsied. ?- Duodenal extrinsic deformity (seems to be the gallbladder based on the EUS more than ?anything else). ?- Normal mucosa was found in the duodenal bulb, in the first portion of the duodenum and ?in the second portion of the duodenum. ?- Normal major papilla. ?EUS Impression: ?- A perisplenic cystic lesion was noted. ?- Pancreatic parenchymal abnormalities consisting of diffuse echogenicity, lobularity with ?honeycombing, hyperechoic foci without shadowing and hyperechoic strands were noted in ?the entire pancreas. This patient has chronic pancreatitis. The parenchyma is concerning ?for significant necrosis, but not clear that I have an ability to safely perform a ?cystgastrostomy or enterostomy. ?- As noted above, the patient now has had significant changes in the way the previous CT ?looks compared to now, so we did not perform any other interventions. He will need more ?imaging. ?- There was no sign of significant pathology in the common bile duct and in the common ?hepatic duct. ?- Hyperechoic material consistent with sludge was visualized endosonographically in the ?gallbladder. This was in a region consistent with the gallbladder though small chance this could have been a peripancreatic fluid collection in that area of the RUQ as well. ?- A few enlarged lymph nodes were visualized in the celiac region (level 20). Tissue has not ?been obtained.  However, the endosonographic appearance is consistent with benign ?inflammatory changes. ?- Endosonographically, there was normal vascular flow in the  splenic vein. ?- Endosonographic images of the left kidney were unremarkable ? ? ? ?Past Medical History:  ?Diagnosis Date  ? AKI (acute kidney injury) ([place]) 12/21/2021  ? Anxiety   ? Diabetes ([place])   ? Pancreatitis   ? Splenic vein thrombosis   ? ? ?Past Surgical History:  ?Procedure Laterality Date  ? BIOPSY  12/02/2021  ? Procedure: BIOPSY;  Surgeon: Irving Copas., MD;  Location: [facility];  Service: Gastroenterology;;  ? ESOPHAGOGASTRODUODENOSCOPY (EGD) WITH PROPOFOL N/A 12/02/2021  ? Procedure: ESOPHAGOGASTRODUODENOSCOPY (EGD) WITH PROPOFOL;  Surgeon: Rush Landmark Telford Nab., MD;  Location: [facility];  Service: Gastroenterology;  Laterality: N/A;  ? NO PAST SURGERIES    ? UPPER ESOPHAGEAL ENDOSCOPIC ULTRASOUND (EUS) N/A 12/02/2021  ? Procedure: UPPER ESOPHAGEAL ENDOSCOPIC ULTRASOUND (EUS);  Surgeon: Irving Copas., MD;  Location: [facility];  Service: Gastroenterology;  Laterality: N/A;  ? ? ?Prior to Admission medications   ?Medication Sig Start Date End Date Taking? Authorizing Provider  ?HYDROmorphone (DILAUDID) 4 MG tablet Take 1 tablet (4 mg total) by mouth every 6 (six) hours as needed for severe pain. 12/31/21  Yes [name], Dinah C, NP  ?insulin aspart (NOVOLOG) 100 UNIT/ML FlexPen Inject 3 Units into the skin 3 (three) times daily with meals. 12/22/21  Yes Shah, Pratik D, DO  ?insulin glargine (LANTUS) 100 UNIT/ML Solostar Pen Inject 16 Units into the skin daily. 12/22/21  Yes Shah, Pratik D, DO  ?lipase/protease/amylase (CREON) 36000 UNITS CPEP capsule Take 2 capsules (72,000 Units total) by mouth 3 (three) times daily with meals. May also take 1 capsule (36,000 Units total) as needed (with snacks). 01/01/22  Yes [name], Telford Nab., MD  ?Multiple Vitamins-Minerals (ADVANCED DIABETIC MULTIVITAMIN PO) Take 1 tablet by mouth daily.   Yes [provider]  ?Multiple Vitamins-Minerals (MULTIVITAMIN ADULTS) TABS Take 1 tablet by mouth daily.   Yes [provider]   ?omeprazole (PRILOSEC) 40 MG capsule Take 40 mg by mouth daily.   Yes [provider]  ?blood glucose meter kit and supplies KIT Dispense based on patient and insurance preference. Use up to four times daily as directed. 09/16/21   Alma Friendly, MD  ?Continuous Blood Gluc Sensor (FREESTYLE LIBRE 2 SENSOR) MISC Use as directed. 12/22/21   Heath Lark D, DO  ?Insulin Pen Needle (PEN NEEDLES 3/16") 31G X 5 MM MISC Use as directed with insulin pen 09/16/21   Alma Friendly, MD  ?ondansetron (ZOFRAN) 8 MG tablet Take 1 tablet (8 mg total) by mouth every 8 (eight) hours as needed for nausea or vomiting. ?Patient not taking: Reported on 01/30/2022 12/02/21   [name], Telford Nab., MD  ? ? ?Current Facility-Administered Medications  ?Medication Dose Route Frequency Provider Last Rate Last Admin  ? enoxaparin (LOVENOX) injection 60 mg  1 mg/kg Subcutaneous Q12H Dimple Nanas, RPH   60 mg at 01/31/22 0846  ? insulin aspart (novoLOG) injection 0-9 Units  0-9 Units Subcutaneous Q4H Antonieta Pert, MD   7 Units at 01/31/22 0418  ? lactated ringers infusion   Intravenous Continuous Antonieta Pert, MD 150 mL/hr at 01/31/22 0210 New Bag at 01/31/22 0210  ? morphine (PF) 2 MG/ML injection 2 mg  2 mg Intravenous Q2H PRN Antonieta Pert, MD   2 mg at 01/31/22 0842  ? ondansetron (ZOFRAN) tablet 4 mg  4 mg Oral Q6H PRN Antonieta Pert, MD      ?  Or  ? ondansetron (ZOFRAN) injection 4 mg  4 mg Intravenous Q6H PRN Kc, Ramesh, MD      ? oxyCODONE (Oxy IR/ROXICODONE) immediate release tablet 5 mg  5 mg Oral Q4H PRN Antonieta Pert, MD   5 mg at 01/30/22 2105  ? pantoprazole (PROTONIX) injection 40 mg  40 mg Intravenous Q24H Kc, Ramesh, MD   40 mg at 01/30/22 2001  ? ? ?Allergies as of 01/30/2022 - Review Complete 01/30/2022  ?Allergen Reaction Noted  ? Other Anaphylaxis 09/26/2020  ? ? ?Family History  ?Problem Relation Age of Onset  ? Hypertension Mother   ? Alcoholism Maternal Uncle   ? Diabetes Maternal Grandmother   ? Diabetes  Maternal Grandfather   ? Diabetes Paternal Grandmother   ? Diabetes Paternal Grandfather   ? Colon cancer Neg Hx   ? Esophageal cancer Neg Hx   ? Inflammatory bowel disease Neg Hx   ? Liver disease Neg Hx   ? Pancreati

## 2022-01-31 NOTE — Progress Notes (Signed)
?PROGRESS NOTE ?Carlos [name]  HUT:[id number] DOB: April 28, 1986 DOA: 01/30/2022 ?PCP: [name], MD  ? 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  Patient also complains of epigastric pain with radiation to right upper quadrant and right flank.  He has been having high blood sugar in 300s.  No report of chest pain fever chills headache focal weakness, hemoptysis melena.  Having watery stool. ?In the ED, vitals fairly stable, afebrile, labs shows uncontrolled hyperglycemia 335, normal anion gap and normal urine lites/renal function, lipase 66, CBC stable.  Patient was also seen in the ED for same reason 3/24.  Underwent CT abdomen pelvis with contrast that showed acute interstitial edematous pancreatitis superimposed on chronic pancreatitis, peripancreatic fluid collection most consistent with pseudocyst, nonocclusive thrombus in the portal vein likely secondary to pancreatitis.  Patient reports that he was previously on Eliquis for a month and was started by his GI physician for procedure/EGD/stent ?-Reports he has not been back on anticoagulation since then,he believes it is since February. GI and heme-onc consulted and admission requested for further management.  ? ?Subjective: ?Overnight afebrile. ?Blood sugar downtrending. ?Reports no more diarrhea nausea vomiting since admission, diet advanced to full liquid diet, had some pain but not that bad in his abdomen. ? ?Assessment and Plan: ?Principal Problem: ?  Acute pancreatitis ?Active Problems: ?  Pancreatic pseudocyst ?  History of pancreatitis ?  Abdominal pain, chronic, epigastric ?  Uncontrolled type 1 diabetes mellitus with hyperglycemia, with long-term current use of insulin (HCC) ?  Nausea vomiting and diarrhea ?  Portal  vein thrombosis ? ?Acute interstitial edematous pancreatitis ?Chronic pancreatitis ?Pancreatic pseudocysts: ?Patient is clinically improving.  Continue aggressive IV fluid hydration pain control antiemetics PPI.  GI has been consulted.  Advance diet as tolerated, continue pain management, PPI twice daily for possible esophagitis diet on CLD advance as tolerated. ?  ?Nausea vomiting and diarrhea ?Possible esophagitis: ?No recurrence of nausea vomiting or diarrhea.  Fu GI panel-had loose stool last night but none since.  Cont enteric precaution until then.  Symptomatic management. ?  ?Uncontrolled type 1 diabetes mellitus with uncontrolled hyperglycemia: improving, cont ssi,hopefully resume home insulin at lower dose since he is now picking up ORAL INTAKE ?Recent Labs  ?Lab 01/30/22 ?2029 01/30/22 ?2324 01/31/22 ?0407 01/31/22 ?6568 01/31/22 ?1139  ?GLUCAP 196* 232* 331* 70 182*  ? 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  GI/hem onc consulted -Dr. Al Pimple from hematology advised Lovenox therapeutic dose while inpatient and upon discharge DOAC x3 months. Patient was previously on Eliquis it appears it was stopped for EUS and he states he did not resume after that. GI will consult.  Patient reports she does have Eliquis at home. ?  ?Prior history of alcohol abuse, denies current use - last etoh use 2 and half years ago ?Mild hyponatremia which is pseudohyponatremia.  Monitor ? ?Body mass index is 16.9 kg/m?Marland Kitchen  Consult dietitian.  ? ? ?DVT prophylaxis: SCDs Start: 01/30/22 1717 ?Code Status:   Code Status: Full Code ?Family Communication: plan of care discussed with patient at bedside. ?Patient status is: inpatient Level of care: Telemetry  ?Remains inpatient because: Ongoing management of acute pancreatitis ?Patient currently not stable ? ?Dispo:  The patient is from: home ?           Anticipated disposition:  home ? ?Mobility Assessment (last 72 hours)   ? ? Mobility Assessment   ? ? Row Name 01/30/22 1950  ?  ?  ?  ?  ? Does patient have an order for bedrest or is patient medically unstable No - Continue assessment      ? What is the highest level of mobility based on the progressive mobility assessment? Level 6 (Walks independently in room and hall) - Balance while walking in room without assist - Complete      ? ?  ?  ? ?  ?  ? ?Objective: ?Vitals last 24 hrs: ?Vitals:  ? 01/30/22 1950 01/30/22 2325 01/31/22 0409 01/31/22 1232  ?BP:  122/84 127/86 (!) 134/93  ?Pulse:  68 62 67  ?Resp:  18 18 16   ?Temp:  98.6 ?F (37 ?C) 98.1 ?F (36.7 ?C) 98.2 ?F (36.8 ?C)  ?TempSrc:  Oral Oral Oral  ?SpO2:  100% 100% 100%  ?Weight: 64.6 kg     ?Height: 6\' 5"  (1.956 m)     ? ?Weight change:  ? ?Physical Examination: ?General exam: AAox3,older than stated age, weak appearing. ?HEENT:Oral mucosa moist, Ear/Nose WNL grossly, dentition normal. ?Respiratory system: bilaterally clear BS, no use of accessory muscle ?Cardiovascular system: S1 & S2 +, No JVD,. ?Gastrointestinal system: Abdomen soft, mildly tender with guarding,ND, BS+ ?Nervous System:Alert, awake, moving extremities and grossly nonfocal ?Extremities: LE edema none,distal peripheral pulses palpable.  ?Skin: No rashes,no icterus. ?MSK: Normal muscle bulk,tone, power ? ?Medications reviewed:  ?Scheduled Meds: ? enoxaparin (LOVENOX) injection  1 mg/kg Subcutaneous Q12H  ? insulin aspart  0-9 Units Subcutaneous Q4H  ? pantoprazole (PROTONIX) IV  40 mg Intravenous Q24H  ? ?Continuous Infusions: ? lactated ringers 150 mL/hr at 01/31/22 0210  ? ? ?  ?Diet Order   ? ?       ?  Diet full liquid Room service appropriate? Yes; Fluid consistency: Thin  Diet effective now       ?  ? ?  ?  ? ?  ?  ? ?  ?  ?  ? ? ?Intake/Output Summary (Last 24 hours) at 01/31/2022 1436 ?Last data filed at 01/31/2022 0534 ?Gross per 24 hour  ?Intake 1781.69 ml  ?Output 300 ml  ?Net 1481.69 ml  ? ?Net IO Since  Admission: 1,481.69 mL [01/31/22 1436]  ?Wt Readings from Last 3 Encounters:  ?01/30/22 64.6 kg  ?01/29/22 58.8 kg  ?12/22/21 58.8 kg  ?  ? ?Unresulted Labs (From admission, onward)  ? ?  Start     Ordered  ? 01/30/22 1658  Gastrointestinal Panel by PCR , Stool  (Gastrointestinal Panel by PCR, Stool                                                                                                                                                     **  Does Not include CLOSTRIDIUM DIFFICILE testing. **If CDIFF testing is needed, place order from the "C Difficile Testing" order set.**)  ONCE - STAT,   STAT       ? 01/30/22 1657  ? ?  ?  ? ?  ?Data Reviewed: I have personally reviewed following labs and imaging studies ?CBC: ?Recent Labs  ?Lab 02-10-22 ?2244 01/30/22 ?1346 01/31/22 ?0349  ?WBC 3.8* 4.9 4.6  ?NEUTROABS 2.2  --   --   ?HGB 11.9* 12.9* 11.4*  ?HCT 35.7* 38.4* 34.8*  ?MCV 100.6* 100.8* 99.7  ?PLT 226 214 173  ? ?Basic Metabolic Panel: ?Recent Labs  ?Lab 2022/02/10 ?2244 01/30/22 ?1346 01/31/22 ?0349  ?NA 135 133* 132*  ?K 3.5 4.1 3.5  ?CL 101 99 101  ?CO2 24 25 24   ?GLUCOSE 258* 335* 288*  ?BUN 6 9 8   ?CREATININE 0.84 0.99 0.91  ?CALCIUM 9.1 9.1 8.5*  ? ?GFR: ?Estimated Creatinine Clearance: 102.5 mL/min (by C-G formula based on SCr of 0.91 mg/dL). ?Liver Function Tests: ?Recent Labs  ?Lab 02/10/22 ?2244 01/30/22 ?1346 01/31/22 ?0349  ?AST 20 22 15   ?ALT 16 18 15   ?ALKPHOS 101 99 89  ?BILITOT 0.5 0.6 0.5  ?PROT 7.0 7.8 6.3*  ?ALBUMIN 3.6 4.0 3.1*  ? ?Recent Labs  ?Lab 02-10-22 ?2244 01/30/22 ?1346 01/31/22 ?0349  ?LIPASE 64* 66* 65*  ? ?No results for input(s): AMMONIA in the last 168 hours. ?Coagulation Profile: ?Recent Labs  ?Lab 01/31/22 ?0349  ?INR 1.0  ? ?BNP (last 3 results) ?No results for input(s): PROBNP in the last 8760 hours. ?HbA1C: ?No results for input(s): HGBA1C in the last 72 hours. ?CBG: ?Recent Labs  ?Lab 01/30/22 ?2029 01/30/22 ?2324 01/31/22 ?0407 01/31/22 ?2030 01/31/22 ?1139  ?GLUCAP 196*  232* 331* 70 182*  ? ?Lipid Profile: ?No results for input(s): CHOL, HDL, LDLCALC, TRIG, CHOLHDL, LDLDIRECT in the last 72 hours. ?Thyroid Function Tests: ?No results for input(s): TSH, T4TOTAL, FREET4, T3FREE, THYROI

## 2022-01-31 NOTE — TOC Initial Note (Signed)
Transition of Care (TOC) - Initial/Assessment Note  ? ? ?Patient Details  ?Name: Carlos [name] ?MRN: [id number] ?Date of Birth: 04-Mar-1986 ? ?Transition of Care (TOC) CM/SW Contact:    ?Cecille Po, RN ?Phone Number: ?01/31/2022, 2:46 PM ? ?Clinical Narrative:                 ? ?Patient from home with significant other.  Patient with chronic pancreatitis and chronic pain.  TOC following for discharge needs.  ? ? ?Expected Discharge Plan: Home/Self Care ?Barriers to Discharge: Continued Medical Work up ? ? ?Patient Goals and CMS Choice ?Patient states their goals for this hospitalization and ongoing recovery are:: return home ?  ?  ? ?Expected Discharge Plan and Services ?Expected Discharge Plan: Home/Self Care ?  ?  ?  ?Living arrangements for the past 2 months: Single Family Home ?                ?  ? ?Prior Living Arrangements/Services ?Living arrangements for the past 2 months: Single Family Home ?Lives with:: Significant Other ?Patient language and need for interpreter reviewed:: Yes ?Do you feel safe going back to the place where you live?: Yes      ?Need for Family Participation in Patient Care: Yes (Comment) ?Care giver support system in place?: Yes (comment) ?  ?Criminal Activity/Legal Involvement Pertinent to Current Situation/Hospitalization: No - Comment as needed ? ?Activities of Daily Living ?Home Assistive Devices/Equipment: CBG Meter, Eyeglasses ?ADL Screening (condition at time of admission) ?Patient's cognitive ability adequate to safely complete daily activities?: Yes ?Is the patient deaf or have difficulty hearing?: No ?Does the patient have difficulty seeing, even when wearing glasses/contacts?: No ?Does the patient have difficulty concentrating, remembering, or making decisions?: No ?Patient able to express need for assistance with ADLs?: Yes ?Does the patient have difficulty dressing or bathing?: No ?Independently performs ADLs?: Yes (appropriate for developmental age) ?Does the patient have  difficulty walking or climbing stairs?: No ?Weakness of Legs: None ?Weakness of Arms/Hands: None ? ?  ?  ?Orientation: : Oriented to Self, Oriented to Place, Oriented to  Time, Oriented to Situation ?Alcohol / Substance Use: Not Applicable (states no longer uses ETOH) ?Psych Involvement: No (comment) ? ?Admission diagnosis:  Portal vein thrombosis [I81] ?Nausea vomiting and diarrhea [R11.2, R19.7] ?Acute pancreatitis, unspecified complication status, unspecified pancreatitis type [K85.90] ?Patient Active Problem List  ? Diagnosis Date Noted  ? Nausea vomiting and diarrhea 01/30/2022  ? 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  ? HHNC (hyperglycemic hyperosmolar nonketotic coma) (HCC) 12/21/2021  ? Nausea & vomiting 12/21/2021  ? Dehydration 12/21/2021  ? AKI (acute kidney injury) (HCC) 12/21/2021  ? Pancreatitis 12/07/2021  ? Hypokalemia 12/07/2021  ? Uncontrolled type 1 diabetes mellitus with hyperglycemia, with long-term current use of insulin (HCC) 12/07/2021  ? Candida esophagitis (HCC) 12/07/2021  ? RUQ pain   ? Diabetes mellitus due to underlying condition without complication, without long-term current use of insulin (HCC) 10/13/2021  ? Acute pancreatitis 10/13/2021  ? Abnormal LFTs 10/13/2021  ? Protein-calorie malnutrition, severe 09/15/2021  ? 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)   ? DKA (diabetic ketoacidosis) (HCC) 09/13/2021  ? Chronic anticoagulation 08/22/2021  ? 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  ? Pancreatic pseudocyst 08/22/2021  ? History of pancreatitis 08/22/2021  ? Abdominal pain, chronic, epigastric 08/22/2021  ? Abnormal CT of the abdomen 08/22/2021  ? Early satiety 08/22/2021  ? History of ETT   ? Hyperglycemia 09/27/2020  ? Hyponatremia 09/27/2020  ? Transaminitis 09/27/2020  ?  Severe sepsis (HCC) 09/27/2020  ? Alcohol withdrawal delirium (HCC)   ? Acute alcoholic pancreatitis 09/26/2020  ? Alcohol dependence syndrome (HCC) 09/26/2020  ? Tobacco dependence 09/26/2020  ? Bilateral  impacted cerumen 03/05/2020  ? 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 ?PCP:  [name], MD ?Pharmacy:   ?[id number] - [place] - [address] ?[address] ?[place] [address] ?Phone: [phone] Fax: [phone] ? ?[id number] - [place],  - [address] ?[address] ?[place] [address] ?Phone: [phone] Fax: [phone] ? ? ?Readmission Risk Interventions ? ?  10/07/2020  ?  1:10 PM  ?Readmission Risk Prevention Plan  ?Transportation Screening Complete  ?PCP or Specialist Appt within 5-7 Days Complete  ?Home Care Screening Complete  ?Medication Review (RN CM) Complete  ? ? ? ?

## 2022-02-01 ENCOUNTER — Other Ambulatory Visit (HOSPITAL_COMMUNITY): Payer: Self-pay

## 2022-02-01 ENCOUNTER — Encounter (HOSPITAL_COMMUNITY): Payer: Self-pay | Admitting: Internal Medicine

## 2022-02-01 DIAGNOSIS — K8689 Other specified diseases of pancreas: Secondary | ICD-10-CM

## 2022-02-01 DIAGNOSIS — K859 Acute pancreatitis without necrosis or infection, unspecified: Secondary | ICD-10-CM | POA: Diagnosis not present

## 2022-02-01 DIAGNOSIS — K8500 Idiopathic acute pancreatitis without necrosis or infection: Secondary | ICD-10-CM | POA: Diagnosis not present

## 2022-02-01 DIAGNOSIS — Z8719 Personal history of other diseases of the digestive system: Secondary | ICD-10-CM

## 2022-02-01 DIAGNOSIS — K858 Other acute pancreatitis without necrosis or infection: Secondary | ICD-10-CM | POA: Diagnosis not present

## 2022-02-01 LAB — GLUCOSE, CAPILLARY
Glucose-Capillary: 104 mg/dL — ABNORMAL HIGH (ref 70–99)
Glucose-Capillary: 153 mg/dL — ABNORMAL HIGH (ref 70–99)
Glucose-Capillary: 132 mg/dL — ABNORMAL HIGH (ref 70–99)

## 2022-02-01 MED ORDER — TRAZODONE HCL 50 MG PO TABS: 50.0000 mg | ORAL_TABLET | Freq: Every evening | ORAL | 0 refills | Status: DC | PRN

## 2022-02-01 MED ORDER — TRAZODONE HCL 50 MG PO TABS
50.0000 mg | ORAL_TABLET | Freq: Every evening | ORAL | Status: DC | PRN
  Administered 2022-02-01: 50 mg via ORAL
  Filled 2022-02-01: qty 1

## 2022-02-01 MED ORDER — APIXABAN 5 MG PO TABS
5.0000 mg | ORAL_TABLET | Freq: Two times a day (BID) | ORAL | 0 refills | Status: DC
Start: 2022-02-01 — End: 2022-03-23

## 2022-02-01 MED ORDER — APIXABAN 5 MG PO TABS: 5.0000 mg | ORAL_TABLET | Freq: Two times a day (BID) | ORAL | Status: DC

## 2022-02-01 NOTE — Progress Notes (Addendum)
Inpatient Diabetes Program Recommendations ? ?AACE/ADA: New Consensus Statement on Inpatient Glycemic Control (2015) ? ?Target Ranges:  Prepandial:   less than 140 mg/dL ?     Peak postprandial:   less than 180 mg/dL (1-2 hours) ?     Critically ill patients:  140 - 180 mg/dL  ? ?Lab Results  ?Component Value Date  ? GLUCAP 153 (H) 02/01/2022  ? HGBA1C 9.2 (H) 12/21/2021  ? ? ?Review of Glycemic Control ? Latest Reference Range & Units 09/14/21 06:10 12/21/21 03:42  ?Hemoglobin A1C 4.8 - 5.6 % 10.1 (H) 9.2 (H)  ? ?Diabetes history: DM type 1 listed in chart ?Outpatient Diabetes medications: Lantus 16 units Daily, Novolog 3 units tid meal coverage increase by 1 unit if eating a meal with more carbohydrates, Freestyle libre 2 CGM ?Current orders for Inpatient glycemic control:  ?Levemir 5 units Daily ?Novolog 0-9 units Q4 ? ?Pt just saw PCP 2/14 ?Pt had Diabetes education since A1c check on 3/7 discussing in detail diet and insulin doses ?A1c level is trending downward since diagnosis. ? ?Will see pt and see if he was following sick day guidelines ? ?Follow glucose trends for now. ? ?Addendum 1112 am: ?Spoke with pt at bedside. Pt reports having good follow up with his doctors and learned a lot at his recent education appointment. He learned how to count carbohydrates and take more insulin with more carbohydrate intake. Pt reports having all supplies needed and having good support. Pt also reports being familiar with sick day guidelines. No needs identified at this time. ? ?Thanks, ? ?Christena Deem RN, MSN, BC-ADM ?Inpatient Diabetes Coordinator ?Team Pager [phone] (8a-5p) ? ? ?

## 2022-02-01 NOTE — Progress Notes (Addendum)
? ? Progress Note ? ? Subjective  ?Chief Complaint: Acute on chronic pancreatitis ? ?Patient states he was able to sleep last night, has some pain this morning but has not received pain medications. ?Has nausea denies vomiting. ?Patient is on full liquid diet, states he is hungry at this time. ?Patient is ambulating and walking. ?Denies fever, chills, abdominal swelling. ?Had bowel movement yesterday around 8 PM, denies melena hematochezia, formed stools. ? ? ? Objective  ? ?Vital signs in last 24 hours: ?Temp:  [98.2 ?F (36.8 ?C)-98.8 ?F (37.1 ?C)] 98.3 ?F (36.8 ?C) (03/27 0511) ?Pulse Rate:  [54-67] 54 (03/27 0511) ?Resp:  [16-18] 18 (03/27 0511) ?BP: (122-134)/(80-93) 122/80 (03/27 0511) ?SpO2:  [100 %] 100 % (03/27 0511) ?Last BM Date : 01/30/22 ? ?General:   male in no acute distress  ?Heart:  regular rate and rhythm ?Pulm: Clear anteriorly; no wheezing ?Abdomen:  Soft,  nondistended  AB, Normal bowel sounds. mild tenderness in the epigastrium. With guarding and Without rebound, . ?Extremities:  Without edema. ?Neurologic:  Alert and  oriented x4;  grossly normal neurologically.  ?Skin: No jaundice, sclera clear, no icterus.  Dry and intact without significant lesions or rashes. ?Psychiatric: Cooperative. Normal mood and affect. ? ?Intake/Output from previous day: ?03/26 0701 - 03/27 0700 ?In: 3815.2 [P.O.:240; I.V.:3575.2] ?Out: -  ?Intake/Output this shift: ?No intake/output data recorded. ? ?Lab Results: ?Recent Labs  ?  01/29/22 ?2244 01/30/22 ?1346 01/31/22 ?0349  ?WBC 3.8* 4.9 4.6  ?HGB 11.9* 12.9* 11.4*  ?HCT 35.7* 38.4* 34.8*  ?PLT 226 214 173  ? ?BMET ?Recent Labs  ?  01/29/22 ?2244 01/30/22 ?1346 01/31/22 ?0349  ?NA 135 133* 132*  ?K 3.5 4.1 3.5  ?CL 101 99 101  ?CO2 24 25 24   ?GLUCOSE 258* 335* 288*  ?BUN 6 9 8   ?CREATININE 0.84 0.99 0.91  ?CALCIUM 9.1 9.1 8.5*  ? ?LFT ?Recent Labs  ?  01/31/22 ?0349  ?PROT 6.3*  ?ALBUMIN 3.1*  ?AST 15  ?ALT 15  ?ALKPHOS 89  ?BILITOT 0.5  ? ?PT/INR ?Recent Labs  ?   01/31/22 ?0349  ?LABPROT 13.3  ?INR 1.0  ? ? ?Studies/Results: ?CT ABDOMEN PELVIS W CONTRAST ? ?Result Date: 01/30/2022 ?CLINICAL DATA:  Epigastric pain for 3 days. EXAM: CT ABDOMEN AND PELVIS WITH CONTRAST TECHNIQUE: Multidetector CT imaging of the abdomen and pelvis was performed using the standard protocol following bolus administration of intravenous contrast. RADIATION DOSE REDUCTION: This exam was performed according to the departmental dose-optimization program which includes automated exposure control, adjustment of the mA and/or kV according to patient size and/or use of iterative reconstruction technique. CONTRAST:  179mL OMNIPAQUE IOHEXOL 300 MG/ML  SOLN COMPARISON:  CT abdomen pelvis dated 12/07/2021. FINDINGS: Lower chest: No acute abnormality. Hepatobiliary: No focal liver abnormality is seen. No gallstones, gallbladder wall thickening, or biliary dilatation. Pancreas: The pancreas is enlarged and edematous with areas of calcifications, consistent with acute on chronic pancreatitis. Multiple areas of hypoattenuation within the pancreas likely represent pseudocysts. These measure up to 2.7 cm in the pancreatic body (series 2, image 18 and 1.9 cm in the pancreatic tail (series 4, image 60). No definite pancreatic necrosis is identified. Spleen: Normal in size without focal abnormality. Adrenals/Urinary Tract: Adrenal glands are unremarkable. Kidneys are normal, without renal calculi, focal lesion, or hydronephrosis. Bladder is unremarkable. Stomach/Bowel: Stomach is within normal limits. No pericecal inflammatory changes are noted to suggest acute appendicitis. No evidence of bowel wall thickening, distention, or inflammatory changes. Vascular/Lymphatic:  A nonocclusive filling defect in the portal vein is most consistent with venous thrombus (series 2, image 20 and series 4 images 50-52). No abdominal or pelvic lymphadenopathy is identified. Reproductive: Prostate is unremarkable. Other: No abdominal wall  hernia or abnormality. No abdominopelvic ascites. Musculoskeletal: No acute or significant osseous findings. IMPRESSION: 1. Findings most consistent with acute interstitial edematous pancreatitis superimposed on chronic pancreatitis. Peripancreatic fluid collections are most consistent with pseudocysts. 2. Nonocclusive thrombus in the portal vein is likely secondary to pancreatitis. Electronically Signed   By: Zerita Boers M.D.   On: 01/30/2022 16:11   ? ? ? Impression/Plan:  ? ?Acute on Chronic Pancreatitis with history of complication pseudocyst, previous splenic vein thrombosis (never restarted Eliquis after his EUS in 11/2021), new onset diabetes presents with epigastric pain.   ?S/p EUS/EGD 12/02/2021. ?AST 15 ALT 15  ?Alkphos 89 TBili 0.5 ?CT shows findings most consistent with acute interstitial edematous ?pancreatitis superimposed on chronic pancreatitis. Peripancreatic ?fluid collections are most consistent with pseudocysts.  Pseudocysts appear smaller than previous. ?-No clear abnormality in pancrease/fluid collection that would benefit from intervention per Dr. Havery Moros, Dr. Rush Landmark familiar with patient.  Continue supportive care at this time.  ?-Pain control per the inpatient medical team. ?-Continue early ambulation ?-Advance diet as tolerated, patient is hungry, agree with soft diet.  ?- Will suggest getting genetic pancreatic labs with SPINK, PRSS1, CFTR, CTRC, can consider genetic counseling referral. Can also get IGG4, ANA outpatient.  ?- will need outpatient follow up in the GI clinic. ?  ?Chronic pancreatitis ?On Creon 2 tablets 3 times daily outpatient ?  ?New onset diabetes secondary to pancreatitis ? continue medical management per primary ? ?Gastritis negative H. Pylori ?Avoid NSAIDs ?Continue pantoprazole 40 mg IV daily ?  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   ?Now on  Lovenox here and will restart oral anticoagulation upon discharge. ?  ? ?Future Appointments  ?Date Time Provider [place]  ?03/05/2022 11:30 AM [name], Telford Nab., MD [facility]  ?03/10/2022  2:20 PM [name], Melanie Crazier, MD [facility] None  ?03/23/2022  3:00 PM [name], MD [facility] None  ? ? ? ? LOS: 2 days  ? ?Vladimir Crofts  02/01/2022, 8:27 AM ? ? ? ?

## 2022-02-01 NOTE — Discharge Summary (Signed)
Physician Discharge Summary  ?Carlos [name] ZDG:[id number] DOB: 05/16/1986 DOA: 01/30/2022 ? ?PCP: [name], MD ? ?Admit date: 01/30/2022 ?Discharge date: 02/01/2022 ?Recommendations for Outpatient Follow-up:  ?Follow up with PCP in 1 weeks-call for appointment ?Please obtain BMP/CBC in one week ? ?Discharge Dispo: HOME ?Discharge Condition: Stable ?Code Status:   Code Status: Full Code ?Diet recommendation:  ?Diet Order   ? ?       ?  DIET SOFT Room service appropriate? Yes; Fluid consistency: Thin  Diet effective now       ?  ? ?  ?  ? ?  ?  ? 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  Patient also complains of epigastric pain with radiation to right upper quadrant and right flank.  He has been having high blood sugar in 300s.  No report of chest pain fever chills headache focal weakness, hemoptysis melena.  Having watery stool. ?In the ED, vitals fairly stable, afebrile, labs shows uncontrolled hyperglycemia 335, normal anion gap and normal urine lites/renal function, lipase 66, CBC stable.  Patient was also seen in the ED for same reason 3/24.  Underwent CT abdomen pelvis with contrast that showed acute interstitial edematous pancreatitis superimposed on chronic pancreatitis, peripancreatic fluid collection most consistent with pseudocyst, nonocclusive thrombus in the portal vein likely secondary to pancreatitis.  Patient reports that he was previously on Eliquis for a month and was started by his GI physician for procedure/EGD/stent ?-Reports he has not been back on anticoagulation since then,he believes it is since February. GI and heme-onc consulted and admission requested for further management.  Diet was slowly advanced at this time tolerating diet today did well on soft diet no  more diarrhea no nausea vomiting.  Blood sugar fairly stable he will resume his insulin regimen at this oral intake picks up.  Seen by GI they will follow-up as outpatient.  Patient will continue anticoagulation x3 months he already has Eliquis supplies at home we will prescribe him his anticoagulants.  He is requesting sleep aid for discharge. ?  ? ?Discharge Diagnoses:  ?Principal Problem: ?  Acute pancreatitis ?Active Problems: ?  Pancreatic pseudocyst ?  History of pancreatitis ?  Abdominal pain, chronic, epigastric ?  Uncontrolled type 1 diabetes mellitus with hyperglycemia, with long-term current use of insulin ([place]) ?  Nausea vomiting and diarrhea ?  Portal vein thrombosis ? ?Assessment and Plan: ?Acute interstitial edematous pancreatitis ?Chronic pancreatitis ?Pancreatic pseudocysts: ?Patient is clinically improved-tolerating soft diet, overall stable continues oral Dilaudid for pain control at home, will follow-up with GI as outpatient, continue home Creon.  Seen by GI this morning will need outpatient GI follow-up.   ?  ?Nausea vomiting and diarrhea ?Possible esophagitis: ?No recurrence.  Follow-up with PPI and other medication as above.  GI panel was negative ?  ?Uncontrolled type 1 diabetes mellitus with uncontrolled hyperglycemia: Fairly stable resume his insulin regimen upon discharge as oral intake picks up.  ? 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  GI/hem onc consulted -Dr. Chryl Heck from hematology advised Lovenox therapeutic dose while inpatient and upon discharge DOAC x3 months. Patient was previously on Eliquis it appears it was stopped for EUS and he states he did  not resume after that.  Being discharged on Eliquis he already has medication at home ?  ?Prior history of alcohol abuse, denies current use - last etoh use 2 and half years ago ?Mild hyponatremia which is pseudohyponatremia.   Monitor ? Consults: ?GI, HEM-ONC ?Subjective: ?AAOX3, doing well on RA. Ate well. ? ?Discharge Exam: ?Vitals:  ? 02/01/22 0511 02/01/22 1214  ?BP: 122/80 119/79  ?Pulse: (!) 54 69  ?Resp: 18 18  ?Temp: 98.3 ?F (36.8 ?C) 99.5 ?F (37.5 ?C)  ?SpO2: 100% 100%  ? ?General: Pt is alert, awake, not in acute distress ?Cardiovascular: RRR, S1/S2 +, no rubs, no gallops ?Respiratory: CTA bilaterally, no wheezing, no rhonchi ?Abdominal: Soft, NT, ND, bowel sounds + ?Extremities: no edema, no cyanosis ? ?Discharge Instructions ? ?Discharge Instructions   ? ? Discharge instructions   Complete by: As directed ?  ? Follow-up with your gastroenterologist as scheduled call for appointment ? ?Please call call MD or return to ER for similar or worsening recurring problem that brought you to hospital or if any fever,nausea/vomiting,abdominal pain, uncontrolled pain, chest pain,  shortness of breath or any other alarming symptoms. ? ?Please follow-up your doctor as instructed in a week time and call the office for appointment. ? ?Please avoid alcohol, smoking, or any other illicit substance and maintain healthy habits including taking your regular medications as prescribed. ? ?You were cared for by a hospitalist during your hospital stay. If you have any questions about your discharge medications or the care you received while you were in the hospital after you are discharged, you can call the unit and ask to speak with the hospitalist on call if the hospitalist that took care of you is not available. ? ?Once you are discharged, your primary care physician will handle any further medical issues. Please note that NO REFILLS for any discharge medications will be authorized once you are discharged, as it is imperative that you return to your primary care physician (or establish a relationship with a primary care physician if you do not have one) for your aftercare needs so that they can reassess your need for medications and monitor your  lab values ? ? ?Check blood sugar 3 times a day and bedtime at home. ?If blood sugar running above 200 less than 70 please call your MD to adjust insulin. ?If blood sugars running less 100 do not use insulin and call MD. ?If you noticed signs and symptoms of hypoglycemia or low blood sugar like jitteriness, confusion, thirst, tremor, sweating- Check blood sugar, drink sugary drink/biscuits/sweets to increase sugar level and call MD or return to ER. ?:  ? Increase activity slowly   Complete by: As directed ?  ? ?  ? ?Allergies as of 02/01/2022   ? ?   Reactions  ? Other Anaphylaxis  ? [name] syrup  ? ?  ? ?  ?Medication List  ?  ? ?TAKE these medications   ? ?ADVANCED DIABETIC MULTIVITAMIN PO ?Take 1 tablet by mouth daily. ?  ?Multivitamin Adults Tabs ?Take 1 tablet by mouth daily. ?  ?apixaban 5 MG Tabs tablet ?Commonly known as: ELIQUIS ?Take 1 tablet (5 mg total) by mouth 2 (two) times daily. ?  ?blood glucose meter kit and supplies Kit ?Dispense based on patient and insurance preference. Use up to four times daily as directed. ?  ?FreeStyle Libre 2 Sensor Misc ?Use as directed. ?  ?HYDROmorphone 4 MG tablet ?Commonly known as: Dilaudid ?Take 1 tablet (4 mg total)  by mouth every 6 (six) hours as needed for severe pain. ?  ?insulin aspart 100 UNIT/ML FlexPen ?Commonly known as: NOVOLOG ?Inject 3 Units into the skin 3 (three) times daily with meals. ?  ?insulin glargine 100 UNIT/ML Solostar Pen ?Commonly known as: LANTUS ?Inject 16 Units into the skin daily. ?  ?lipase/protease/amylase 36000 UNITS Cpep capsule ?Commonly known as: Creon ?Take 2 capsules (72,000 Units total) by mouth 3 (three) times daily with meals. May also take 1 capsule (36,000 Units total) as needed (with snacks). ?  ?omeprazole 40 MG capsule ?Commonly known as: PRILOSEC ?Take 40 mg by mouth daily. ?  ?ondansetron 8 MG tablet ?Commonly known as: Zofran ?Take 1 tablet (8 mg total) by mouth every 8 (eight) hours as needed for nausea or  vomiting. ?  ?Pen Needles 3/16" 31G X 5 MM Misc ?Use as directed with insulin pen ?  ?traZODone 50 MG tablet ?Commonly known as: DESYREL ?Take 1 tablet (50 mg total) by mouth at bedtime as needed for up to 10 doses for s

## 2022-02-02 ENCOUNTER — Telehealth: Payer: Self-pay | Admitting: *Deleted

## 2022-02-02 NOTE — Telephone Encounter (Signed)
Transition Care Management Follow-up Telephone Call ?Date of discharge and from where: 02/01/2022 [place] ?How have you been since you were released from the hospital? Better, still having some abdominal pain ?Any questions or concerns? No ? ?Items Reviewed: ?Did the pt receive and understand the discharge instructions provided? Yes  ?Medications obtained and verified? Yes  ?Other? No  ?Any new allergies since your discharge? No  ?Dietary orders reviewed? Yes ?Do you have support at home? Yes  ? ?Home Care and Equipment/Supplies: ?Were home health services ordered? no ?If so, what is the name of the agency? na  ?Has the agency set up a time to come to the patient's home? not applicable ?Were any new equipment or medical supplies ordered?  No ?What is the name of the medical supply agency? na ?Were you able to get the supplies/equipment? not applicable ?Do you have any questions related to the use of the equipment or supplies? No ? ?Functional Questionnaire: (I = Independent and D = Dependent) ?ADLs: I ? ?Bathing/Dressing- I ? ?Meal Prep- I ? ?Eating- I ? ?Maintaining continence- I ? ?Transferring/Ambulation- I ? ?Managing Meds- I ? ?Follow up appointments reviewed: ? ?PCP Hospital f/u appt confirmed? Yes  Scheduled to see Dr. Hyacinth Meeker on 02/09/2022 @ 2:00 ?Specialist Hospital f/u appt confirmed? Yes  Scheduled to see GI on 03/05/2022  ?Are transportation arrangements needed? No  ?If their condition worsens, is the pt aware to call PCP or go to the Emergency Dept.? Yes ?Was the patient provided with contact information for the PCP's office or ED? Yes ?Was to pt encouraged to call back with questions or concerns? Yes  ?

## 2022-02-03 ENCOUNTER — Other Ambulatory Visit: Payer: Self-pay | Admitting: Family Medicine

## 2022-02-03 DIAGNOSIS — F1020 Alcohol dependence, uncomplicated: Secondary | ICD-10-CM

## 2022-02-03 MED ORDER — OMEPRAZOLE 40 MG PO CPDR: 40.0000 mg | DELAYED_RELEASE_CAPSULE | Freq: Every day | ORAL | 1 refills | Status: DC

## 2022-02-03 NOTE — Telephone Encounter (Signed)
[name], MD  You 54 minutes ago (2:58 PM)  ? ?I am not comfortable prescribing Dilaudid on a chronic basis but can provide enough until he can be seen in a pain management office   ? ? ? ?Patient notified and agreed.  ?Pended Referral and sent to Dr. Hyacinth Meeker for approval for Referral and Rx.  ?

## 2022-02-03 NOTE — Telephone Encounter (Signed)
Patient requested refills though Patient request in Epic.  ?Epic LR: 12/31/2021 ?Contract Date: 12/22/2021 ?Pended Rx and sent to Dr. Hyacinth Meeker for approval.  ?

## 2022-02-03 NOTE — Telephone Encounter (Signed)
Patient is requesting refill on medication "Hydromorphone 4mg ". Patient last refill dated 12/31/2021. Patient has Opioid Contract on file dated 01/05/2022. Patient just seen in hospital 01/29/2022-01/30/2022. Medication pend and sent to PCP 02/01/2022, MD.  ?

## 2022-02-04 ENCOUNTER — Telehealth: Payer: Medicaid Other | Admitting: Family Medicine

## 2022-02-04 DIAGNOSIS — K859 Acute pancreatitis without necrosis or infection, unspecified: Secondary | ICD-10-CM

## 2022-02-04 NOTE — Progress Notes (Signed)
[facility]  ? ?Patient in pain from acute pancreatitis and needs help with this. ?In person exam needed. ? ?Message sent. ?

## 2022-02-05 MED ORDER — HYDROMORPHONE HCL 4 MG PO TABS: 4.0000 mg | ORAL_TABLET | Freq: Four times a day (QID) | ORAL | 0 refills | Status: DC | PRN

## 2022-02-09 ENCOUNTER — Inpatient Hospital Stay: Payer: Managed Care, Other (non HMO) | Admitting: Family Medicine

## 2022-02-09 ENCOUNTER — Ambulatory Visit (INDEPENDENT_AMBULATORY_CARE_PROVIDER_SITE_OTHER): Payer: Managed Care, Other (non HMO) | Admitting: Family Medicine

## 2022-02-09 ENCOUNTER — Encounter: Payer: Self-pay | Admitting: Family Medicine

## 2022-02-09 VITALS — BP 118/80 | HR 109 | Temp 96.9°F | Ht 77.0 in | Wt 142.0 lb

## 2022-02-09 DIAGNOSIS — E089 Diabetes mellitus due to underlying condition without complications: Secondary | ICD-10-CM | POA: Diagnosis not present

## 2022-02-09 DIAGNOSIS — K8522 Alcohol induced acute pancreatitis with infected necrosis: Secondary | ICD-10-CM | POA: Diagnosis not present

## 2022-02-09 DIAGNOSIS — K860 Alcohol-induced chronic pancreatitis: Secondary | ICD-10-CM

## 2022-02-09 DIAGNOSIS — E1010 Type 1 diabetes mellitus with ketoacidosis without coma: Secondary | ICD-10-CM

## 2022-02-09 DIAGNOSIS — F1020 Alcohol dependence, uncomplicated: Secondary | ICD-10-CM

## 2022-02-09 MED ORDER — HYDROMORPHONE HCL 4 MG PO TABS: 4.0000 mg | ORAL_TABLET | ORAL | 0 refills | Status: DC | PRN

## 2022-02-09 MED ORDER — FREESTYLE LIBRE 2 SENSOR MISC: 2 refills | Status: DC

## 2022-02-09 NOTE — Progress Notes (Signed)
? ? ?Provider:  ?Alain Honey, MD ? ?Careteam: ?Patient Care Team: ?[name], MD as PCP - General (Family Medicine) ?Jodi Marble, MD as Consulting Physician (Otolaryngology) ? ?PLACE OF SERVICE:  ?[facility]  ?Advanced Directive information ?  ? ?Allergies  ?Allergen Reactions  ? Other Anaphylaxis  ?  [name] syrup  ? ? ?Chief Complaint  ?Patient presents with  ? Hospitalization Follow-up  ?  Patient presents today for a hospital follow-up. He was admitted to [facility] on 3/25-3/27/23 for acute pancreatitis. He reports labs and CT scan.  ? ? ? ?HPI: Patient is a 36 y.o. male patient is in process of getting established at pain clinic who will prescribe his Dilaudid.  He is out of medicine today and experiencing pain.  Medicine has run out 2 days ago.  4 mg of Dilaudid does relieve his pain but then the effect wears off before next dose 6 hours later so it would appear that he needs medicine prescribed more frequently rather than higher dose. ?He is taking the long-acting as well as short acting insulin now.  Most recent A1c was 9.1.  We may be involved in managing his diabetes going forward ? ?Review of Systems:  ?Review of Systems  ?Gastrointestinal:  Positive for abdominal pain.  ?All other systems reviewed and are negative. ? ?Past Medical History:  ?Diagnosis Date  ? AKI (acute kidney injury) ([place]) 12/21/2021  ? Anxiety   ? Diabetes ([place])   ? Pancreatitis   ? Splenic vein thrombosis   ? ?Past Surgical History:  ?Procedure Laterality Date  ? BIOPSY  12/02/2021  ? Procedure: BIOPSY;  Surgeon: Irving Copas., MD;  Location: [facility];  Service: Gastroenterology;;  ? ESOPHAGOGASTRODUODENOSCOPY (EGD) WITH PROPOFOL N/A 12/02/2021  ? Procedure: ESOPHAGOGASTRODUODENOSCOPY (EGD) WITH PROPOFOL;  Surgeon: Rush Landmark Telford Nab., MD;  Location: [facility];  Service: Gastroenterology;  Laterality: N/A;  ? NO PAST SURGERIES    ? UPPER ESOPHAGEAL ENDOSCOPIC ULTRASOUND (EUS) N/A  12/02/2021  ? Procedure: UPPER ESOPHAGEAL ENDOSCOPIC ULTRASOUND (EUS);  Surgeon: Irving Copas., MD;  Location: [facility];  Service: Gastroenterology;  Laterality: N/A;  ? ?Social History: ?  reports that he has been smoking cigars. He started smoking about 19 years ago. He has never used smokeless tobacco. He reports that he does not currently use alcohol. He reports that he does not use drugs. ? ?Family History  ?Problem Relation Age of Onset  ? Hypertension Mother   ? Alcoholism Maternal Uncle   ? Diabetes Maternal Grandmother   ? Diabetes Maternal Grandfather   ? Diabetes Paternal Grandmother   ? Diabetes Paternal Grandfather   ? Colon cancer Neg Hx   ? Esophageal cancer Neg Hx   ? Inflammatory bowel disease Neg Hx   ? Liver disease Neg Hx   ? Pancreatic cancer Neg Hx   ? Rectal cancer Neg Hx   ? Stomach cancer Neg Hx   ? ? ?Medications: ?Patient's Medications  ?New Prescriptions  ? No medications on file  ?Previous Medications  ? APIXABAN (ELIQUIS) 5 MG TABS TABLET    Take 1 tablet (5 mg total) by mouth 2 (two) times daily.  ? BLOOD GLUCOSE METER KIT AND SUPPLIES KIT    Dispense based on patient and insurance preference. Use up to four times daily as directed.  ? CONTINUOUS BLOOD GLUC SENSOR (FREESTYLE LIBRE 2 SENSOR) MISC    Use as directed.  ? HYDROMORPHONE (DILAUDID) 4 MG TABLET    Take 1  tablet (4 mg total) by mouth every 6 (six) hours as needed for severe pain.  ? INSULIN ASPART (NOVOLOG) 100 UNIT/ML FLEXPEN    Inject 3 Units into the skin 3 (three) times daily with meals.  ? INSULIN GLARGINE (LANTUS) 100 UNIT/ML SOLOSTAR PEN    Inject 16 Units into the skin daily.  ? INSULIN PEN NEEDLE (PEN NEEDLES 3/16") 31G X 5 MM MISC    Use as directed with insulin pen  ? LIPASE/PROTEASE/AMYLASE (CREON) 36000 UNITS CPEP CAPSULE    Take 2 capsules (72,000 Units total) by mouth 3 (three) times daily with meals. May also take 1 capsule (36,000 Units total) as needed (with snacks).  ? MULTIPLE VITAMINS-MINERALS  (ADVANCED DIABETIC MULTIVITAMIN PO)    Take 1 tablet by mouth daily.  ? OMEPRAZOLE (PRILOSEC) 40 MG CAPSULE    Take 1 capsule (40 mg total) by mouth daily.  ? ONDANSETRON (ZOFRAN) 8 MG TABLET    Take 1 tablet (8 mg total) by mouth every 8 (eight) hours as needed for nausea or vomiting.  ?Modified Medications  ? No medications on file  ?Discontinued Medications  ? MULTIPLE VITAMINS-MINERALS (MULTIVITAMIN ADULTS) TABS    Take 1 tablet by mouth daily.  ? TRAZODONE (DESYREL) 50 MG TABLET    Take 1 tablet (50 mg total) by mouth at bedtime as needed for up to 10 doses for sleep.  ? ? ?Physical Exam: ? ?Vitals:  ? 02/09/22 1315  ?BP: 118/80  ?Pulse: (!) 109  ?Temp: (!) 96.9 ?F (36.1 ?C)  ?SpO2: 98%  ?Weight: 142 lb (64.4 kg)  ?Height: _0  (1.956 m)  ? ?Body mass index is 16.84 kg/m?. ?Wt Readings from Last 3 Encounters:  ?02/09/22 142 lb (64.4 kg)  ?01/30/22 142 lb 8 oz (64.6 kg)  ?01/29/22 129 lb 10.1 oz (58.8 kg)  ? ? ?Physical Exam ?Vitals reviewed.  ?Constitutional:   ?   Appearance: Normal appearance.  ?Cardiovascular:  ?   Rate and Rhythm: Normal rate.  ?Pulmonary:  ?   Effort: Pulmonary effort is normal.  ?Abdominal:  ?   General: Abdomen is flat. Bowel sounds are normal.  ?Neurological:  ?   Mental Status: He is alert.  ? ? ?Labs reviewed: ?Basic Metabolic Panel: ?Recent Labs  ?  09/15/21 ?0449 09/16/21 ?0447 10/09/21 ?1525 12/07/21 ?0940 12/08/21 ?0427 12/21/21 ?0342 12/21/21 ?0900 01/29/22 ?2244 01/30/22 ?1346 01/31/22 ?0349  ?NA 131* 129*   < > 130*   < > 125*   < > 135 133* 132*  ?K 4.5 3.8   < > 3.2*   < > 4.3   < > 3.5 4.1 3.5  ?CL 96* 94*   < > 94*   < > 82*   < > 101 99 101  ?CO2 28 28   < > 24   < > 27   < > _1 ?GLUCOSE 211* 231*   < > 231*   < > 776*   < > 258* 335* 288*  ?BUN 11 14   < > 6   < > 20   < > _2 ?CREATININE 0.61 0.55*   < > 0.81   < > 1.17   < > 0.84 0.99 0.91  ?CALCIUM 8.7* 8.5*   < > 9.0   < > 10.5*   < > 9.1 9.1 8.5*  ?MG 1.7 2.0  --  1.6*  --  3.0*  --   --   --   --   ?  PHOS  1.5* 3.6  --   --   --  5.8*  --   --   --   --   ? < > = values in this interval not displayed.  ? ?Liver Function Tests: ?Recent Labs  ?  01/29/22 ?2244 01/30/22 ?1346 01/31/22 ?0349  ?AST _0 ?ALT _1 ?ALKPHOS 101 99 89  ?BILITOT 0.5 0.6 0.5  ?PROT 7.0 7.8 6.3*  ?ALBUMIN 3.6 4.0 3.1*  ? ?Recent Labs  ?  01/29/22 ?2244 01/30/22 ?1346 01/31/22 ?0349  ?LIPASE 64* 66* 65*  ? ?No results for input(s): AMMONIA in the last 8760 hours. ?CBC: ?Recent Labs  ?  12/21/21 ?0342 12/22/21 ?1601 01/29/22 ?2244 01/30/22 ?1346 01/31/22 ?0349  ?WBC 8.2 6.6 3.8* 4.9 4.6  ?NEUTROABS 6.6 4,937 2.2  --   --   ?HGB 14.4 12.5* 11.9* 12.9* 11.4*  ?HCT 40.8 36.9* 35.7* 38.4* 34.8*  ?MCV 99.0 101.7* 100.6* 100.8* 99.7  ?PLT 489* 362 226 214 173  ? ?Lipid Panel: ?Recent Labs  ?  12/08/21 ?5910  ?TRIG 35  ? ?TSH: ?No results for input(s): TSH in the last 8760 hours. ?A1C: ?Lab Results  ?Component Value Date  ? HGBA1C 9.2 (H) 12/21/2021  ? ? ? ?Assessment/Plan ? ?1. Chronic pancreatitis due to chronic alcoholism ([place]) ?Patient still has considerable pain that is being treated with Dilaudid.  Has been referred to pain clinic for management of chronic pain ? ?2. Type 1 diabetes mellitus with ketoacidosis without coma ([place]) ?Currently taking Lantus and short acting insulin.  Monitoring sugars with freestyle libre and it sounds like sugars are coming under control ? ? ? ? ?Alain Honey, MD ?[facility] ?[phone]  ? ?

## 2022-02-22 ENCOUNTER — Ambulatory Visit (INDEPENDENT_AMBULATORY_CARE_PROVIDER_SITE_OTHER): Payer: Managed Care, Other (non HMO) | Admitting: Family

## 2022-02-22 ENCOUNTER — Encounter: Payer: Self-pay | Admitting: Family

## 2022-02-22 VITALS — BP 110/80 | HR 70 | Temp 96.6°F | Resp 16 | Ht 77.0 in | Wt 154.0 lb

## 2022-02-22 DIAGNOSIS — R1013 Epigastric pain: Secondary | ICD-10-CM | POA: Diagnosis not present

## 2022-02-22 DIAGNOSIS — F1020 Alcohol dependence, uncomplicated: Secondary | ICD-10-CM

## 2022-02-22 DIAGNOSIS — K860 Alcohol-induced chronic pancreatitis: Secondary | ICD-10-CM

## 2022-02-22 DIAGNOSIS — G8929 Other chronic pain: Secondary | ICD-10-CM | POA: Diagnosis not present

## 2022-02-22 MED ORDER — HYDROMORPHONE HCL 4 MG PO TABS: 4.0000 mg | ORAL_TABLET | ORAL | 0 refills | Status: DC | PRN

## 2022-02-22 NOTE — Telephone Encounter (Signed)
Called [facility] [phone] and spoke with Lattie Haw and initiated the Prior Authorization for Hydromorphone.  ?Went into Determination 48-72 hours.  ?PA# [id number] ? ?Awaiting Determination. Patient Aware.  ?

## 2022-02-22 NOTE — Progress Notes (Signed)
? ?Provider: Marlowe Sax FNP-C ? ?[name], MD ? ?Patient Care Team: ?[name], MD as PCP - General (Family Medicine) ?Jodi Marble, MD as Consulting Physician (Otolaryngology) ? ?Extended Emergency Contact Information ?Primary Emergency Contact: [email or web address] ?Address: [place], [place] [address] [place] ?Home Phone: [phone] ?Work Phone: [phone] ?Mobile Phone: [phone] ?Relation: Significant other ? ?Code Status:  Full Code  ?Goals of care: Advanced Directive information ? ?  02/22/2022  ?  9:16 AM  ?Advanced Directives  ?Does Patient Have a Medical Advance Directive? No  ?Would patient like information on creating a medical advance directive? No - Patient declined  ? ? ? ?Chief Complaint  ?Patient presents with  ? Medical Management of Chronic Issues  ?  Patient is requesting medication for chronic pancreatitis.   ? ? ?HPI:  ?Pt is a 36 y.o. male seen today for an acute visit for evaluation of right upper quadrant pain rating 8/10 prior to taking pain meds and down 5 -6 after taking pain meds.has upcoming appointment with pain management on 03/19/2022 request pain medication refill. Also has upcoming appointment with Gastroenterologist on 03/05/2022.CT of the abdomen done 01/30/2022 findings most consistent with acute interstitial edematous pancreatitis superimposed on chronic pancreatitis and peripancreatic fluid collection consistent with pseudocysts.  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  Right upper quadrant ultrasound done 12/07/2021 showed distended gallbladder containing sludge but no gallstones or evidence of cholecystitis.  No biliary dilatation noted. ? ?CBG runs in the 150's -200 has had some readings before bedtime in the 50's.Eats a snack at bedtime. Denies any signs of hypoglycemia.he will notify provider for any low blood sugars.  ? ? ?Past Medical History:  ?Diagnosis Date  ?  AKI (acute kidney injury) ([place]) 12/21/2021  ? Anxiety   ? Diabetes ([place])   ? Pancreatitis   ? Splenic vein thrombosis   ? ?Past Surgical History:  ?Procedure Laterality Date  ? BIOPSY  12/02/2021  ? Procedure: BIOPSY;  Surgeon: Irving Copas., MD;  Location: [facility];  Service: Gastroenterology;;  ? ESOPHAGOGASTRODUODENOSCOPY (EGD) WITH PROPOFOL N/A 12/02/2021  ? Procedure: ESOPHAGOGASTRODUODENOSCOPY (EGD) WITH PROPOFOL;  Surgeon: Rush Landmark Telford Nab., MD;  Location: [facility];  Service: Gastroenterology;  Laterality: N/A;  ? NO PAST SURGERIES    ? UPPER ESOPHAGEAL ENDOSCOPIC ULTRASOUND (EUS) N/A 12/02/2021  ? Procedure: UPPER ESOPHAGEAL ENDOSCOPIC ULTRASOUND (EUS);  Surgeon: Irving Copas., MD;  Location: [facility];  Service: Gastroenterology;  Laterality: N/A;  ? ? ?Allergies  ?Allergen Reactions  ? Other Anaphylaxis  ?  [name] syrup  ? ? ?Outpatient Encounter Medications as of 02/22/2022  ?Medication Sig  ? apixaban (ELIQUIS) 5 MG TABS tablet Take 1 tablet (5 mg total) by mouth 2 (two) times daily.  ? blood glucose meter kit and supplies KIT Dispense based on patient and insurance preference. Use up to four times daily as directed.  ? Continuous Blood Gluc Sensor (FREESTYLE LIBRE 2 SENSOR) MISC Use as directed.  ? HYDROmorphone (DILAUDID) 4 MG tablet Take 1 tablet (4 mg total) by mouth every 4 (four) hours as needed for severe pain.  ? insulin aspart (NOVOLOG) 100 UNIT/ML FlexPen Inject 3 Units into the skin 3 (three) times daily with meals.  ? insulin glargine (LANTUS) 100 UNIT/ML Solostar Pen Inject 16 Units into the skin daily.  ? Insulin Pen Needle (PEN NEEDLES 3/16") 31G X 5 MM MISC  Use as directed with insulin pen  ? lipase/protease/amylase (CREON) 36000 UNITS CPEP capsule Take 2 capsules (72,000 Units total) by mouth 3 (three) times daily with meals. May also take 1 capsule (36,000 Units total) as needed (with snacks).  ? Multiple Vitamins-Minerals (ADVANCED DIABETIC  MULTIVITAMIN PO) Take 1 tablet by mouth daily.  ? omeprazole (PRILOSEC) 40 MG capsule Take 1 capsule (40 mg total) by mouth daily.  ? ondansetron (ZOFRAN) 8 MG tablet Take 1 tablet (8 mg total) by mouth every 8 (eight) hours as needed for nausea or vomiting.  ? ?No facility-administered encounter medications on file as of 02/22/2022.  ? ? ?Review of Systems  ?Constitutional:  Negative for appetite change, chills, fatigue, fever and unexpected weight change.  ?Eyes:  Positive for visual disturbance. Negative for pain, discharge, redness and itching.  ?     Double vision has improved has upcoming appointment with Ophthalmology.  ?Respiratory:  Negative for cough, chest tightness, shortness of breath and wheezing.   ?Cardiovascular:  Negative for chest pain, palpitations and leg swelling.  ?Gastrointestinal:  Positive for abdominal pain. Negative for abdominal distention, blood in stool, constipation, diarrhea, nausea and vomiting.  ?     Epigastric pain with radiation to the back.   ?Musculoskeletal:  Negative for arthralgias, back pain, gait problem, joint swelling, myalgias, neck pain and neck stiffness.  ?Skin:  Negative for color change, pallor and rash.  ? ?Immunization History  ?Administered Date(s) Administered  ? Influenza,inj,Quad PF,6+ Mos 09/15/2021  ? ?Pertinent  Health Maintenance Due  ?Topic Date Due  ? FOOT EXAM  Never done  ? OPHTHALMOLOGY EXAM  Never done  ? URINE MICROALBUMIN  Never done  ? INFLUENZA VACCINE  06/08/2022  ? HEMOGLOBIN A1C  06/20/2022  ? ? ?  01/30/2022  ?  7:50 PM 01/31/2022  ?  8:47 AM 01/31/2022  ?  8:30 PM 02/01/2022  ?  8:44 AM 02/22/2022  ?  9:16 AM  ?Fall Risk  ?Falls in the past year?     0  ?Was there an injury with Fall?     0  ?Fall Risk Category Calculator     0  ?Fall Risk Category     Low  ?Patient Fall Risk Level _0   ?Patient at Risk for Falls Due to     No Fall Risks  ?Fall risk Follow up     Falls evaluation  completed  ? ?Functional Status Survey: ?  ? ?Vitals:  ? 02/22/22 0913  ?BP: 110/80  ?Pulse: 70  ?Resp: 16  ?Temp: (!) 96.6 ?F (35.9 ?C)  ?SpO2: 91%  ?Weight: 154 lb (69.9 kg)  ?Height: _1  (1.956 m)  ? ?Body mass index is 18.26 kg/m?Marland Kitchen ?Physical Exam ?Vitals reviewed.  ?Constitutional:   ?   General: He is not in acute distress. ?   Appearance: Normal appearance. He is normal weight. He is not ill-appearing or diaphoretic.  ?HENT:  ?   Mouth/Throat:  ?   Pharynx: No posterior oropharyngeal erythema.  ?Eyes:  ?   General: No scleral icterus.    ?   Right eye: No discharge.     ?   Left eye: No discharge.  ?   Conjunctiva/sclera: Conjunctivae normal.  ?   Pupils: Pupils are equal, round, and reactive to light.  ?Cardiovascular:  ?   Rate and Rhythm: Normal rate and regular rhythm.  ?   Pulses:  Normal pulses.  ?   Heart sounds: Normal heart sounds. No murmur heard. ?  No friction rub. No gallop.  ?Pulmonary:  ?   Effort: Pulmonary effort is normal. No respiratory distress.  ?   Breath sounds: Normal breath sounds. No wheezing, rhonchi or rales.  ?Chest:  ?   Chest wall: No tenderness.  ?Abdominal:  ?   General: Bowel sounds are normal. There is no distension.  ?   Palpations: Abdomen is soft. There is no mass.  ?   Tenderness: There is abdominal tenderness in the right upper quadrant and epigastric area. There is no right CVA tenderness, left CVA tenderness, guarding or rebound. Negative signs include Murphy's sign.  ?Skin: ?   General: Skin is warm and dry.  ?   Coloration: Skin is not pale.  ?   Findings: No erythema or rash.  ?Neurological:  ?   Mental Status: He is alert and oriented to person, place, and time.  ?   Motor: No weakness.  ?   Gait: Gait normal.  ?Psychiatric:     ?   Mood and Affect: Mood normal.     ?   Speech: Speech normal.     ?   Behavior: Behavior normal.  ? ? ?Labs reviewed: ?Recent Labs  ?  09/15/21 ?0449 09/16/21 ?0447 10/09/21 ?1525 12/07/21 ?3536 12/08/21 ?0427 12/21/21 ?0342  12/21/21 ?0900 01/29/22 ?2244 01/30/22 ?1346 01/31/22 ?0349  ?NA 131* 129*   < > 130*   < > 125*   < > 135 133* 132*  ?K 4.5 3.8   < > 3.2*   < > 4.3   < > 3.5 4.1 3.5  ?CL 96* 94*   < > 94*   < > 82*   < > 101 99 101  ?C

## 2022-02-25 ENCOUNTER — Other Ambulatory Visit: Payer: Self-pay

## 2022-02-25 ENCOUNTER — Telehealth: Payer: Self-pay

## 2022-02-25 DIAGNOSIS — K860 Alcohol-induced chronic pancreatitis: Secondary | ICD-10-CM

## 2022-02-25 LAB — DRUG MONITOR, PANEL 1, W/CONF, URINE

## 2022-02-25 LAB — DM TEMPLATE

## 2022-02-25 NOTE — Telephone Encounter (Signed)
Noted  

## 2022-02-25 NOTE — Telephone Encounter (Signed)
I was told by lab tech "April" that urine drug screen was not preformed because specimen was lost by [employer]. Patient called and voicemail was left for patient to contact the office back to schedule lab appointment. I apologized to patient for the inconvenience. I also gave patient the direct number of the lab tech April [phone] to express his concerns/questions in regards to what happened to his urine specimen. Future Order placed for Urine Drug Screen. Message routed to Richarda Blade, NP. No further action is required.  ?

## 2022-02-26 NOTE — Telephone Encounter (Signed)
Patient came into office yesterday 02/25/2022 and Urine Drug Screen performed again.  ?

## 2022-02-27 LAB — DRUG MONITOR, PANEL 1, W/CONF, URINE
Amphetamines: NEGATIVE ng/mL (ref <500)
Barbiturates: NEGATIVE ng/mL (ref <300)
Benzodiazepines: NEGATIVE ng/mL (ref <100)
Cocaine Metabolite: NEGATIVE ng/mL (ref <150)
Codeine: NEGATIVE ng/mL (ref <50)
Creatinine: 52.6 mg/dL (ref >=20.0)
Hydrocodone: NEGATIVE ng/mL (ref <50)
Hydromorphone: 5360 ng/mL — ABNORMAL HIGH (ref <50)
Marijuana Metabolite: NEGATIVE ng/mL (ref <20)
Methadone Metabolite: NEGATIVE ng/mL (ref <100)
Morphine: NEGATIVE ng/mL (ref <50)
Norhydrocodone: NEGATIVE ng/mL (ref <50)
Opiates: POSITIVE ng/mL — AB (ref <100)
Oxidant: NEGATIVE ug/mL (ref <200)
Oxycodone: NEGATIVE ng/mL (ref <100)
Phencyclidine: NEGATIVE ng/mL (ref <25)
pH: 6 (ref 4.5–9.0)

## 2022-02-27 LAB — DM TEMPLATE

## 2022-03-05 ENCOUNTER — Encounter: Payer: Self-pay | Admitting: Gastroenterology

## 2022-03-05 ENCOUNTER — Ambulatory Visit: Payer: Medicaid Other | Admitting: Gastroenterology

## 2022-03-05 ENCOUNTER — Other Ambulatory Visit (INDEPENDENT_AMBULATORY_CARE_PROVIDER_SITE_OTHER): Payer: Medicaid Other

## 2022-03-05 VITALS — BP 119/64 | HR 86 | Ht 77.0 in | Wt 152.2 lb

## 2022-03-05 DIAGNOSIS — R198 Other specified symptoms and signs involving the digestive system and abdomen: Secondary | ICD-10-CM

## 2022-03-05 DIAGNOSIS — R109 Unspecified abdominal pain: Secondary | ICD-10-CM | POA: Diagnosis not present

## 2022-03-05 DIAGNOSIS — I82890 Acute embolism and thrombosis of other specified veins: Secondary | ICD-10-CM

## 2022-03-05 DIAGNOSIS — K863 Pseudocyst of pancreas: Secondary | ICD-10-CM

## 2022-03-05 DIAGNOSIS — K861 Other chronic pancreatitis: Secondary | ICD-10-CM

## 2022-03-05 DIAGNOSIS — K209 Esophagitis, unspecified without bleeding: Secondary | ICD-10-CM

## 2022-03-05 DIAGNOSIS — R1084 Generalized abdominal pain: Secondary | ICD-10-CM

## 2022-03-05 DIAGNOSIS — K8500 Idiopathic acute pancreatitis without necrosis or infection: Secondary | ICD-10-CM | POA: Diagnosis not present

## 2022-03-05 LAB — COMPREHENSIVE METABOLIC PANEL
ALT: 14 U/L (ref 0–53)
AST: 20 U/L (ref 0–37)
Albumin: 4.4 g/dL (ref 3.5–5.2)
Alkaline Phosphatase: 81 U/L (ref 39–117)
BUN: 18 mg/dL (ref 6–23)
CO2: 26 mEq/L (ref 19–32)
Calcium: 9.4 mg/dL (ref 8.4–10.5)
Chloride: 100 mEq/L (ref 96–112)
Creatinine, Ser: 0.99 mg/dL (ref 0.40–1.50)
GFR: 98.14 mL/min (ref >60.00)
Glucose, Bld: 179 mg/dL — ABNORMAL HIGH (ref 70–99)
Potassium: 4.4 mEq/L (ref 3.5–5.1)
Sodium: 135 mEq/L (ref 135–145)
Total Bilirubin: 1.1 mg/dL (ref 0.2–1.2)
Total Protein: 7.8 g/dL (ref 6.0–8.3)

## 2022-03-05 LAB — LIPASE: Lipase: 56 U/L (ref 11.0–59.0)

## 2022-03-05 LAB — AMYLASE: Amylase: 73 U/L (ref 27–131)

## 2022-03-05 MED ORDER — OXYCODONE HCL 5 MG PO TABS
5.0000 mg | ORAL_TABLET | Freq: Four times a day (QID) | ORAL | 0 refills | Status: DC | PRN
Start: 2022-03-05 — End: 2022-04-28

## 2022-03-05 NOTE — Progress Notes (Signed)
? ?GASTROENTEROLOGY OUTPATIENT CLINIC VISIT  ? ?Primary Care Provider ?[name], MD ?[address] ?[place] [address] ?[phone] ? ? ?Patient Profile: ?Carlos [name] is a 36 y.o. 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    The patient presents to the [facility] for an evaluation and management of problem(s) noted below: ? ?Problem List ?1. Idiopathic acute pancreatitis without infection or necrosis   ?2. Chronic pancreatitis, unspecified pancreatitis type ([place])   ?3. Generalized abdominal pain   ?4. Pancreatic pseudocyst   ?5. Splenic vein thrombosis   ?6. Acute esophagitis   ?7. Abnormal findings on EGD/EUS   ? ? ?History of Present Illness ?Please see prior note for full details of HPI. ? ?Interval History ?The patient returns for follow-up and is accompanied by his significant other.  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  He was able to be discharged within 48 to 72 hours.  However he continues to experience issues of a chronic generalized abdominal discomfort.  He has not had any alcohol for months.  He recently saw his PCP and establish with them in regards to a pain contract while awaiting formal pain clinic evaluation for chronic therapy.  There has been consideration of evaluating the patient further for autoimmune pancreatitis as well as genetic induced pancreatitis and that is what we were going to consider today.  The majority of his cystic lesions have changed/altered and thus I see no role for cystgastrostomy creation at this point based on his last imaging.  The patient is working but finding it more difficult to work.  His diabetes is stably controlled at this time.  He remains on anticoagulation for  his previous splenic vein thrombosis. ? ?GI Review of Systems ?Positive as above ?Negative for odynophagia, dysphagia alteration of bowel habits, melena, hematochezia  ? ?Review of Systems ?General: Denies fevers/chills/unintentional weight loss ?Cardiovascular: Denies chest pain ?Pulmonary: Denies shortness of breath ?Gastroenterological: See HPI ?Genitourinary: Denies darkened urine ?Hematological: Denies easy bruising/bleeding ?Dermatological: Denies skin changes ?Psychological: Mood is down due to his recurrent hospitalizations ? ? ?Medications ?Current Outpatient Medications  ?Medication Sig Dispense Refill  ? apixaban (ELIQUIS) 5 MG TABS tablet Take 1 tablet (5 mg total) by mouth 2 (two) times daily. 60 tablet 0  ? blood glucose meter kit and supplies KIT Dispense based on patient and insurance preference. Use up to four times daily as directed. 1 each 0  ? Continuous Blood Gluc Sensor (FREESTYLE LIBRE 2 SENSOR) MISC Use as directed. 1 each 2  ? HYDROmorphone (DILAUDID) 4 MG tablet Take 1 tablet (4 mg total) by mouth every 4 (four) hours as needed for severe pain. 90 tablet 0  ? insulin aspart (NOVOLOG) 100 UNIT/ML FlexPen Inject 3 Units into the skin 3 (three) times daily with meals. 15 mL 0  ? insulin glargine (LANTUS) 100 UNIT/ML Solostar Pen Inject 16 Units into the skin daily. 15 mL 0  ? Insulin Pen Needle (PEN NEEDLES 3/16") 31G X 5 MM MISC Use as directed with insulin pen 100 each 0  ? lipase/protease/amylase (CREON) 36000 UNITS CPEP capsule Take 2 capsules (72,000 Units total) by mouth 3 (three) times daily with meals. May also take 1 capsule (36,000 Units total) as needed (with snacks). 240 capsule 1  ? Multiple Vitamins-Minerals (ADVANCED DIABETIC  MULTIVITAMIN PO) Take 1 tablet by mouth daily.    ? omeprazole (PRILOSEC) 40 MG capsule Take 1 capsule (40 mg total) by mouth daily. 90 capsule 1  ? ondansetron (ZOFRAN) 8 MG tablet Take 1 tablet (8 mg total) by mouth every 8 (eight) hours as needed for  nausea or vomiting. 30 tablet 1  ? oxyCODONE (ROXICODONE) 5 MG immediate release tablet Take 1 tablet (5 mg total) by mouth every 6 (six) hours as needed for severe pain. 15 tablet 0  ? ?No current facility-administered medications for this visit.  ? ? ?Allergies ?Allergies  ?Allergen Reactions  ? Other Anaphylaxis  ?  [name] syrup  ? ? ?Histories ?Past Medical History:  ?Diagnosis Date  ? AKI (acute kidney injury) ([place]) 12/21/2021  ? Anxiety   ? Diabetes ([place])   ? Pancreatitis   ? Splenic vein thrombosis   ? ?Past Surgical History:  ?Procedure Laterality Date  ? BIOPSY  12/02/2021  ? Procedure: BIOPSY;  Surgeon: Irving Copas., MD;  Location: [facility];  Service: Gastroenterology;;  ? ESOPHAGOGASTRODUODENOSCOPY (EGD) WITH PROPOFOL N/A 12/02/2021  ? Procedure: ESOPHAGOGASTRODUODENOSCOPY (EGD) WITH PROPOFOL;  Surgeon: Rush Landmark Telford Nab., MD;  Location: [facility];  Service: Gastroenterology;  Laterality: N/A;  ? NO PAST SURGERIES    ? UPPER ESOPHAGEAL ENDOSCOPIC ULTRASOUND (EUS) N/A 12/02/2021  ? Procedure: UPPER ESOPHAGEAL ENDOSCOPIC ULTRASOUND (EUS);  Surgeon: Irving Copas., MD;  Location: [facility];  Service: Gastroenterology;  Laterality: N/A;  ? ?Social History  ? ?Socioeconomic History  ? Marital status: Single  ?  Spouse name: Not on file  ? Number of children: 6  ? Years of education: Not on file  ? Highest education level: Not on file  ?Occupational History  ? Occupation: Games developer  ?Tobacco Use  ? Smoking status: Some Days  ?  Types: Cigars  ?  Start date: 11/08/2002  ? Smokeless tobacco: Never  ? Tobacco comments:  ?  Once a week.  ?Vaping Use  ? Vaping Use: Never used  ?Substance and Sexual Activity  ? Alcohol use: Not Currently  ? Drug use: Never  ? Sexual activity: Not on file  ?Other Topics Concern  ? Not on file  ?Social History Narrative  ? Not on file  ? ?Social Determinants of Health  ? ?Financial Resource Strain: Not on file  ?Food Insecurity: Not on file   ?Transportation Needs: Not on file  ?Physical Activity: Not on file  ?Stress: Not on file  ?Social Connections: Not on file  ?Intimate Partner Violence: Not on file  ? ?Family History  ?Problem Relation Age of Onset  ? Hypertension Mother   ? Alcoholism Maternal Uncle   ? Diabetes Maternal Grandmother   ? Diabetes Maternal Grandfather   ? Diabetes Paternal Grandmother   ? Diabetes Paternal Grandfather   ? Colon cancer Neg Hx   ? Esophageal cancer Neg Hx   ? Inflammatory bowel disease Neg Hx   ? Liver disease Neg Hx   ? Pancreatic cancer Neg Hx   ? Rectal cancer Neg Hx   ? Stomach cancer Neg Hx   ? ?I have reviewed his medical, social, and family history in detail and updated the electronic medical record as necessary.  ? ? ?PHYSICAL EXAMINATION  ?BP 119/64   Pulse 86   Ht '6\' 5"'  (1.956 m)   Wt 152 lb 3.2 oz (69 kg)   SpO2 97%   BMI 18.05 kg/m?  ?Wt Readings from Last 3 Encounters:  ?  03/05/22 152 lb 3.2 oz (69 kg)  ?02/22/22 154 lb (69.9 kg)  ?02/09/22 142 lb (64.4 kg)  ?GEN: NAD, appears stated age, doesn't appear chronically ill, accompanied by significant other ?PSYCH: Cooperative, without pressured speech ?EYE: Conjunctivae pink, sclerae anicteric ?ENT: MMM ?CV: Nontachycardic ?RESP: No audible wheezing ?GI: NABS, soft, TTP in MEG upon deep palpation, without rebound ?MSK/EXT: No lower extremity edema ?SKIN: No jaundice ?NEURO:  Alert & Oriented x 3, no focal deficits ? ? ?REVIEW OF DATA  ?I reviewed the following data at the time of this encounter: ? ?GI Procedures and Studies  ?January 2023 EUS ?EGD Impression: ?- White nummular lesions in esophageal mucosa. Biopsied. ?- LA Grade D erosive esophagitis with no bleeding distally. ?- Z-line irregular, 45 cm from the incisors. ?- 1 cm hiatal hernia. ?- Angulation deformities in the entire stomach. ?- Erythematous mucosa in the stomach. Biopsied. ?- Duodenal extrinsic deformity (seems to be the gallbladder based on the EUS more than anything else). ?- Normal  mucosa was found in the duodenal bulb, in the first portion of the duodenum and in the second portion of the duodenum. ?- Normal major papilla. ?EUS Impression: ?- A perisplenic cystic lesion was noted. ?- Pancreatic

## 2022-03-05 NOTE — Patient Instructions (Signed)
Your provider has requested that you go to the basement level for lab work before leaving today. Press "B" on the elevator. The lab is located at the first door on the left as you exit the elevator. ? ?You have been referred to [facility]. You will contacted by their office in 1-2 weeks. Please let us know if you have not heard from them within 2 weeks.  ? ?We have sent the following medications to your pharmacy for you to pick up at your convenience: ?Oxycodone  ? ?Send my chart message on Monday letting us know how your doing.  ? ?Thank you for choosing me and [facility]. ? ?Dr. [name] ? ?

## 2022-03-07 ENCOUNTER — Encounter: Payer: Self-pay | Admitting: Gastroenterology

## 2022-03-07 DIAGNOSIS — R1084 Generalized abdominal pain: Secondary | ICD-10-CM | POA: Insufficient documentation

## 2022-03-07 DIAGNOSIS — K209 Esophagitis, unspecified without bleeding: Secondary | ICD-10-CM | POA: Insufficient documentation

## 2022-03-07 DIAGNOSIS — K861 Other chronic pancreatitis: Secondary | ICD-10-CM | POA: Insufficient documentation

## 2022-03-07 DIAGNOSIS — R198 Other specified symptoms and signs involving the digestive system and abdomen: Secondary | ICD-10-CM | POA: Insufficient documentation

## 2022-03-07 DIAGNOSIS — K8500 Idiopathic acute pancreatitis without necrosis or infection: Secondary | ICD-10-CM | POA: Insufficient documentation

## 2022-03-08 ENCOUNTER — Other Ambulatory Visit: Payer: Self-pay

## 2022-03-08 ENCOUNTER — Encounter: Payer: Self-pay | Admitting: Gastroenterology

## 2022-03-08 DIAGNOSIS — F1020 Alcohol dependence, uncomplicated: Secondary | ICD-10-CM

## 2022-03-08 NOTE — Telephone Encounter (Signed)
Treatment agreement on file from 12/22/2021 ? ?RX last refilled on 02/22/2022 ?

## 2022-03-09 ENCOUNTER — Other Ambulatory Visit: Payer: Self-pay | Admitting: Family Medicine

## 2022-03-09 DIAGNOSIS — F1020 Alcohol dependence, uncomplicated: Secondary | ICD-10-CM

## 2022-03-09 MED ORDER — HYDROMORPHONE HCL 4 MG PO TABS: 4.0000 mg | ORAL_TABLET | ORAL | 0 refills | Status: DC | PRN

## 2022-03-09 NOTE — Telephone Encounter (Signed)
Per Dr.Miller (message was sent via a routing comment), patient was referred to pain management clinic. ? ?According to referral notes patient has a pending appointment with pain mgt on 03/19/22 ? ? ?

## 2022-03-09 NOTE — Telephone Encounter (Signed)
Given 40 until his appointment with [facility].  ?

## 2022-03-09 NOTE — Telephone Encounter (Signed)
Patient requesting refill.  ?Epic LR: 02/22/2022 ?Contract Date: 12/22/2021 ?Pended Rx and sent to Dr. Hyacinth Meeker for approval.  ? ? ?Patient was ALSO prescribed Oxycodone 5mg  Yesterday 4/28 by Dr. 5/28, GI ?

## 2022-03-10 ENCOUNTER — Encounter: Payer: Self-pay | Admitting: Internal Medicine

## 2022-03-10 ENCOUNTER — Encounter: Payer: Self-pay | Admitting: Family Medicine

## 2022-03-10 ENCOUNTER — Ambulatory Visit (INDEPENDENT_AMBULATORY_CARE_PROVIDER_SITE_OTHER): Payer: Medicaid Other | Admitting: Internal Medicine

## 2022-03-10 VITALS — BP 130/82 | HR 84 | Ht 77.0 in | Wt 157.8 lb

## 2022-03-10 DIAGNOSIS — E1065 Type 1 diabetes mellitus with hyperglycemia: Secondary | ICD-10-CM | POA: Insufficient documentation

## 2022-03-10 DIAGNOSIS — R739 Hyperglycemia, unspecified: Secondary | ICD-10-CM

## 2022-03-10 DIAGNOSIS — E109 Type 1 diabetes mellitus without complications: Secondary | ICD-10-CM

## 2022-03-10 LAB — LIPID PANEL
Cholesterol: 104 mg/dL (ref 0–200)
HDL: 56.5 mg/dL (ref >39.00)
LDL Cholesterol: 18 mg/dL (ref 0–99)
NonHDL: 47.39
Total CHOL/HDL Ratio: 2
Triglycerides: 149 mg/dL (ref 0.0–149.0)
VLDL: 29.8 mg/dL (ref 0.0–40.0)

## 2022-03-10 LAB — TSH: TSH: 0.81 u[IU]/mL (ref 0.35–5.50)

## 2022-03-10 LAB — MICROALBUMIN / CREATININE URINE RATIO
Creatinine,U: 70.3 mg/dL
Microalb Creat Ratio: 1 mg/g (ref 0.0–30.0)
Microalb, Ur: <0.7 mg/dL (ref 0.0–1.9)

## 2022-03-10 LAB — POCT GLYCOSYLATED HEMOGLOBIN (HGB A1C): Hemoglobin A1C: 6.9 % — AB (ref 4.0–5.6)

## 2022-03-10 LAB — T4, FREE: Free T4: 1.01 ng/dL (ref 0.60–1.60)

## 2022-03-10 MED ORDER — INSULIN PEN NEEDLE 32G X 4 MM MISC: 1.0000 | Freq: Four times a day (QID) | 3 refills | Status: DC

## 2022-03-10 MED ORDER — DEXCOM G6 SENSOR MISC: 1.0000 | 3 refills | Status: DC

## 2022-03-10 MED ORDER — INSULIN GLARGINE 100 UNIT/ML SOLOSTAR PEN: 16.0000 [IU] | PEN_INJECTOR | Freq: Every day | SUBCUTANEOUS | 3 refills | Status: DC

## 2022-03-10 MED ORDER — OMNIPOD 5 DEXG7G6 INTRO GEN 5 KIT: 1.0000 | PACK | 0 refills | Status: DC

## 2022-03-10 MED ORDER — DEXCOM G6 TRANSMITTER MISC: 1.0000 | 3 refills | Status: DC

## 2022-03-10 MED ORDER — INSULIN ASPART 100 UNIT/ML FLEXPEN: PEN_INJECTOR | SUBCUTANEOUS | 1 refills | Status: DC

## 2022-03-10 MED ORDER — OMNIPOD 5 DEXG7G6 PODS GEN 5 MISC: 1.0000 | 3 refills | Status: DC

## 2022-03-10 NOTE — Telephone Encounter (Signed)
Dr.Miller it appears you did approved this refill (in a different encounter), however this is the encounter in which it electronically came over and a medical assistant can not refuse it. ? ?You will need to refuse  ? ? ?

## 2022-03-10 NOTE — Progress Notes (Signed)
?Name: Carlos [name]  ?MRN/ DOB: [id number], [birth year]   ?Age/ Sex: 36 y.o., male   ? ?PCP: [name], MD   ?Reason for Endocrinology Evaluation: Type 1 Diabetes Mellitus  ?   ?Date of Initial Endocrinology Visit: 03/10/2022   ? ? ?PATIENT IDENTIFIER: Mr. Carlos [name] is a 36 y.o. male with a past medical history of T1DM, Hx of chronic pancreatitis due to ETOH abuse. The patient presented for initial endocrinology clinic visit on 03/10/2022 for consultative assistance with his diabetes management.  ? ? ?HPI: ?Mr. [name] was  ? ? ?Diagnosed with DM 09/2021 ?Currently checking blood sugars multiple  x / day,  through CGM ?Hypoglycemia episodes : yes               Symptoms: yes                 Frequency: 1/ week  ?Hemoglobin A1c has ranged from 9.2% in 2023, peaking at 10.1% in 2022. ?Patient required assistance for hypoglycemia: no ?Patient has required hospitalization within the last 1 year from hyper or hypoglycemia: at diagnosis  ? ?In terms of diet, the patient 3 meals a day. Avoids sugar-sweetened beverages  ? ? ?He is a Freight forwarder  at [employer] , works 3rd shift  ? ?HOME DIABETES REGIMEN: ?Lantus 16 units daily  ?Novolog 3-4 units TIDQAC ? ? ?Statin: no ?ACE-I/ARB: no ?Prior Diabetic Education: yes ? ? ? ?CONTINUOUS GLUCOSE MONITORING RECORD INTERPRETATION   ? ?Dates of Recording: 4/21-03/11/2022 ? ?Sensor description:freestyle libre ? ?Results statistics: ?  ?CGM use % of time 61  ?Average  207  ?Time in range     43   %  ?% Time Above 180 31  ?% Time above 250 24  ?% Time Below target 2  ? ?Glycemic patterns summary: Patient has been noted with optimal BG control during his sleeping hours which is between 7 AM and 2 PM, patient has been noted with hypoglycemia during the waking hours ? ?Hyperglycemic episodes postprandial ? ?Hypoglycemic episodes occurred following a bolus ? ?Overnight periods: Trends down to optimal ? ? ?DIABETIC COMPLICATIONS: ?Microvascular complications:  ? ?Denies: CKD, neuropathy,  retinopathy ?Last eye exam: Completed 05/2022 ? ?Macrovascular complications:  ? ?Denies: CAD, PVD, CVA ? ? ?PAST HISTORY: ?Past Medical History:  ?Past Medical History:  ?Diagnosis Date  ? AKI (acute kidney injury) ([place]) 12/21/2021  ? Anxiety   ? Diabetes ([place])   ? Pancreatitis   ? Splenic vein thrombosis   ? ?Past Surgical History:  ?Past Surgical History:  ?Procedure Laterality Date  ? BIOPSY  12/02/2021  ? Procedure: BIOPSY;  Surgeon: Irving Copas., MD;  Location: [facility];  Service: Gastroenterology;;  ? ESOPHAGOGASTRODUODENOSCOPY (EGD) WITH PROPOFOL N/A 12/02/2021  ? Procedure: ESOPHAGOGASTRODUODENOSCOPY (EGD) WITH PROPOFOL;  Surgeon: Rush Landmark Telford Nab., MD;  Location: [facility];  Service: Gastroenterology;  Laterality: N/A;  ? NO PAST SURGERIES    ? UPPER ESOPHAGEAL ENDOSCOPIC ULTRASOUND (EUS) N/A 12/02/2021  ? Procedure: UPPER ESOPHAGEAL ENDOSCOPIC ULTRASOUND (EUS);  Surgeon: Irving Copas., MD;  Location: [facility];  Service: Gastroenterology;  Laterality: N/A;  ?  ?Social History:  reports that he has been smoking cigars. He started smoking about 19 years ago. He has never used smokeless tobacco. He reports that he does not currently use alcohol. He reports that he does not use drugs. ?Family History:  ?Family History  ?Problem Relation Age of Onset  ? Hypertension Mother   ? Alcoholism Maternal Uncle   ?  Diabetes Maternal Grandmother   ? Diabetes Maternal Grandfather   ? Diabetes Paternal Grandmother   ? Diabetes Paternal Grandfather   ? Colon cancer Neg Hx   ? Esophageal cancer Neg Hx   ? Inflammatory bowel disease Neg Hx   ? Liver disease Neg Hx   ? Pancreatic cancer Neg Hx   ? Rectal cancer Neg Hx   ? Stomach cancer Neg Hx   ? ? ? ?HOME MEDICATIONS: ?Allergies as of 03/10/2022   ? ?   Reactions  ? Other Anaphylaxis  ? [name] syrup  ? ?  ? ?  ?Medication List  ?  ? ?  ? Accurate as of Mar 10, 2022  2:34 PM. If you have any questions, ask your nurse or doctor.  ?  ?  ? ?   ? ?ADVANCED DIABETIC MULTIVITAMIN PO ?Take 1 tablet by mouth daily. ?  ?apixaban 5 MG Tabs tablet ?Commonly known as: ELIQUIS ?Take 1 tablet (5 mg total) by mouth 2 (two) times daily. ?  ?blood glucose meter kit and supplies Kit ?Dispense based on patient and insurance preference. Use up to four times daily as directed. ?  ?FreeStyle Libre 2 Sensor Misc ?Use as directed. ?  ?HYDROmorphone 4 MG tablet ?Commonly known as: Dilaudid ?Take 1 tablet (4 mg total) by mouth every 4 (four) hours as needed for severe pain. ?  ?insulin aspart 100 UNIT/ML FlexPen ?Commonly known as: NOVOLOG ?Inject 3 Units into the skin 3 (three) times daily with meals. ?  ?insulin glargine 100 UNIT/ML Solostar Pen ?Commonly known as: LANTUS ?Inject 16 Units into the skin daily. ?  ?lipase/protease/amylase 36000 UNITS Cpep capsule ?Commonly known as: Creon ?Take 2 capsules (72,000 Units total) by mouth 3 (three) times daily with meals. May also take 1 capsule (36,000 Units total) as needed (with snacks). ?  ?omeprazole 40 MG capsule ?Commonly known as: PRILOSEC ?Take 1 capsule (40 mg total) by mouth daily. ?  ?ondansetron 8 MG tablet ?Commonly known as: Zofran ?Take 1 tablet (8 mg total) by mouth every 8 (eight) hours as needed for nausea or vomiting. ?  ?oxyCODONE 5 MG immediate release tablet ?Commonly known as: Roxicodone ?Take 1 tablet (5 mg total) by mouth every 6 (six) hours as needed for severe pain. ?  ?Pen Needles 3/16" 31G X 5 MM Misc ?Use as directed with insulin pen ?  ? ?  ? ? ? ?ALLERGIES: ?Allergies  ?Allergen Reactions  ? Other Anaphylaxis  ?  [name] syrup  ? ? ? ?REVIEW OF SYSTEMS: ?A comprehensive ROS was conducted with the patient and is negative except as per HPI and below:  ?Review of Systems  ?Eyes:  Positive for blurred vision.  ?Gastrointestinal:  Negative for nausea and vomiting.  ?Genitourinary:  Negative for frequency.  ?Endo/Heme/Allergies:  Negative for polydipsia.  ? ?  ?OBJECTIVE:  ? ?VITAL SIGNS: BP 130/82  (BP Location: Left Arm, Patient Position: Sitting, Cuff Size: Small)   Pulse 84   Ht '6\' 5"'  (1.956 m)   Wt 157 lb 12.8 oz (71.6 kg)   SpO2 96%   BMI 18.71 kg/m?   ? ?PHYSICAL EXAM:  ?General: Pt appears well and is in NAD  ?Hydration: Well-hydrated with moist mucous membranes and good skin turgor  ?HEENT: Head: Unremarkable with good dentition. Oropharynx clear without exudate.  ?Eyes: External eye exam normal without stare, lid lag or exophthalmos.  EOM intact.  PERRL.  ?Neck: General: Supple without adenopathy or carotid bruits. ?Thyroid: Thyroid size  normal.  No goiter or nodules appreciated. No thyroid bruit.  ?Lungs: Clear with good BS bilat with no rales, rhonchi, or wheezes  ?Heart: RRR with normal S1 and S2 and no gallops; no murmurs; no rub  ?Abdomen: Generalized tenderness   ?Extremities:  ?Lower extremities - No pretibial edema. No lesions.  ?Neuro: MS is good with appropriate affect, pt is alert and Ox3  ? ? ?DM foot exam: 03/10/2022 ? ?The skin of the feet is intact without sores or ulcerations. ?The pedal pulses are 2+ on right and 2+ on left. ?The sensation is intact to a screening 5.07, 10 gram monofilament bilaterally ? ? ?DATA REVIEWED: ? ?Lab Results  ?Component Value Date  ? HGBA1C 9.2 (H) 12/21/2021  ? HGBA1C 10.1 (H) 09/14/2021  ? HGBA1C 4.9 09/27/2020  ? ? Latest Reference Range & Units 03/10/22 15:11  ?Total CHOL/HDL Ratio  2  ?Cholesterol 0 - 200 mg/dL 104  ?HDL Cholesterol >39.00 mg/dL 56.50  ?LDL (calc) 0 - 99 mg/dL 18  ?MICROALB/CREAT RATIO 0.0 - 30.0 mg/g 1.0  ?NonHDL  47.39  ?Triglycerides 0.0 - 149.0 mg/dL 149.0  ?VLDL 0.0 - 40.0 mg/dL 29.8  ? ? Latest Reference Range & Units 03/10/22 15:11  ?TSH 0.35 - 5.50 uIU/mL 0.81  ?T4,Free(Direct) 0.60 - 1.60 ng/dL 1.01  ? ? Latest Reference Range & Units 03/10/22 15:11  ?Creatinine,U mg/dL 70.3  ?Microalb, Ur 0.0 - 1.9 mg/dL <0.7  ?MICROALB/CREAT RATIO 0.0 - 30.0 mg/g 1.0  ? ? ?ASSESSMENT / PLAN / RECOMMENDATIONS:  ? ?1) Type 1 Diabetes  Mellitus, Optimally controlled, Without complications - Most recent A1c of 6.9 %. Goal A1c < 7.0 %.   ? ? ? ? ?I have discussed with the patient the pathophysiology of diabetes. We went over the natural progression of the

## 2022-03-10 NOTE — Patient Instructions (Addendum)
-   Continue Lantus 16 units once daily  ?- Increase Novolog to 5 units with each meal PLUS additional if needed as below  ?-Novolog correctional insulin: ADD extra units on insulin to your meal-time Novolog dose if your blood sugars are higher than 180. Use the scale below to help guide you:  ? ?Blood sugar before meal Number of units to inject  ?Less than 180 0 unit  ?181 -  230 1 units  ?231 -  280 2 units  ?281 -  330 3 units  ?331 -  380 4 units  ?381 -  430 5 units  ?431 -  480 6 units  ?481 -  530 7 units  ? ?HOW TO TREAT LOW BLOOD SUGARS (Blood sugar LESS THAN 70 MG/DL) ?Please follow the RULE OF 15 for the treatment of hypoglycemia treatment (when your (blood sugars are less than 70 mg/dL)  ? ?STEP 1: Take 15 grams of carbohydrates when your blood sugar is low, which includes:  ?3-4 GLUCOSE TABS  OR ?3-4 OZ OF JUICE OR REGULAR SODA OR ?ONE TUBE OF GLUCOSE GEL   ? ?STEP 2: RECHECK blood sugar in 15 MINUTES ?STEP 3: If your blood sugar is still low at the 15 minute recheck --> then, go back to STEP 1 and treat AGAIN with another 15 grams of carbohydrates. ? ?

## 2022-03-11 ENCOUNTER — Telehealth: Payer: Self-pay | Admitting: Dietician

## 2022-03-11 NOTE — Telephone Encounter (Signed)
Referral received for Dexcom G6 CGM and Omnipod 5 insulin pump training. ? ?Called patient who was not available. ?Left a message stating to call us for training when he gets the CGM and we like to train on this first and we can set up an appointment for Pump training when he receives this as well. ? ?Patient is to call the [facility] office [phone] to schedule. ? ?Oran Rein, RD, LDN, CDCES ? ?

## 2022-03-12 ENCOUNTER — Encounter: Payer: Self-pay | Admitting: Internal Medicine

## 2022-03-16 ENCOUNTER — Encounter: Payer: Self-pay | Admitting: Internal Medicine

## 2022-03-17 ENCOUNTER — Other Ambulatory Visit: Payer: Self-pay | Admitting: Family Medicine

## 2022-03-17 DIAGNOSIS — F1020 Alcohol dependence, uncomplicated: Secondary | ICD-10-CM

## 2022-03-17 DIAGNOSIS — K860 Alcohol-induced chronic pancreatitis: Secondary | ICD-10-CM

## 2022-03-18 ENCOUNTER — Other Ambulatory Visit: Payer: Self-pay | Admitting: Family Medicine

## 2022-03-18 DIAGNOSIS — F1020 Alcohol dependence, uncomplicated: Secondary | ICD-10-CM

## 2022-03-18 MED ORDER — HYDROMORPHONE HCL 4 MG PO TABS: 4.0000 mg | ORAL_TABLET | ORAL | 0 refills | Status: DC | PRN

## 2022-03-18 NOTE — Telephone Encounter (Signed)
Patient has request refill on medication Hydromorphone. Patient medication last refilled 03/09/2022. Patient has Opioid Contract on file dated 01/05/2022. Medication pend and sent to Windell Moulding, NP for approval due to PCP Sabra Heck Lillette Boxer, MD being out of office and Marlowe Sax, NP being off.  ?

## 2022-03-18 NOTE — Telephone Encounter (Signed)
Perfect. Thanks for update

## 2022-03-18 NOTE — Telephone Encounter (Signed)
Patient was recently seen by Marlowe Sax, NP 02/22/2022. She refilled Hydromorphone for patient. Usually he comes into office to get refills by Dr.Miller/Din[place] until he's seen by Pain management clinic. Pain management referral was placed for patient a little while back. New patient appointment for pain clinic scheduled 03/19/2022. Patient has upcoming 4 month follow up appointment with Dr.Miller 03/23/2022. ?

## 2022-03-19 LAB — IGG 4: IgG, Subclass 4: 78 mg/dL (ref 2–96)

## 2022-03-19 LAB — PANCREATITIS: PRSS1

## 2022-03-19 LAB — CYSTIC FIBROSIS GENE TEST

## 2022-03-19 LAB — PANCREATITIS: SPINK1

## 2022-03-22 ENCOUNTER — Other Ambulatory Visit: Payer: Self-pay

## 2022-03-22 ENCOUNTER — Encounter: Payer: Self-pay | Admitting: Internal Medicine

## 2022-03-22 MED ORDER — FREESTYLE LIBRE 2 SENSOR MISC: 0 refills | Status: DC

## 2022-03-23 ENCOUNTER — Ambulatory Visit (INDEPENDENT_AMBULATORY_CARE_PROVIDER_SITE_OTHER): Payer: Medicaid Other | Admitting: Family Medicine

## 2022-03-23 ENCOUNTER — Encounter: Payer: Self-pay | Admitting: Family Medicine

## 2022-03-23 VITALS — BP 122/72 | HR 69 | Temp 95.4°F | Ht 77.0 in | Wt 164.4 lb

## 2022-03-23 DIAGNOSIS — E089 Diabetes mellitus due to underlying condition without complications: Secondary | ICD-10-CM

## 2022-03-23 DIAGNOSIS — F1190 Opioid use, unspecified, uncomplicated: Secondary | ICD-10-CM | POA: Diagnosis not present

## 2022-03-23 DIAGNOSIS — G8929 Other chronic pain: Secondary | ICD-10-CM

## 2022-03-23 DIAGNOSIS — E1065 Type 1 diabetes mellitus with hyperglycemia: Secondary | ICD-10-CM

## 2022-03-23 DIAGNOSIS — Z7901 Long term (current) use of anticoagulants: Secondary | ICD-10-CM | POA: Diagnosis not present

## 2022-03-23 DIAGNOSIS — R1013 Epigastric pain: Secondary | ICD-10-CM | POA: Diagnosis not present

## 2022-03-23 DIAGNOSIS — M7918 Myalgia, other site: Secondary | ICD-10-CM | POA: Insufficient documentation

## 2022-03-23 MED ORDER — HYDROMORPHONE HCL 4 MG PO TABS
4.0000 mg | ORAL_TABLET | ORAL | 0 refills | Status: DC | PRN
Start: 2022-03-23 — End: 2022-03-30

## 2022-03-23 MED ORDER — APIXABAN 5 MG PO TABS: 5.0000 mg | ORAL_TABLET | Freq: Two times a day (BID) | ORAL | 1 refills | Status: DC

## 2022-03-23 NOTE — Progress Notes (Signed)
? ? ?Provider:  ?Alain Honey, MD ? ?Careteam: ?Carlos [name] Care Team: ?[name], MD as PCP - General (Family Medicine) ?Jodi Marble, MD as Consulting Physician (Otolaryngology) ? ?PLACE OF SERVICE:  ?[facility]  ?Advanced Directive information ?  ? ?Allergies  ?Allergen Reactions  ? Other Anaphylaxis  ?  [name] syrup  ? ? ?Chief Complaint  ?Carlos [name] presents with  ? Medical Management of Chronic Issues  ?  Carlos [name] presents today for a 1 month follow-up.  ? ? ? ?HPI: Carlos [name] is a 36 y.o. male Carlos [name] is here needing a refill on his Dilaudid for chronic pain related to chronic pancreatitis.  He was referred to pain clinic in hopes that providers there could treat his pain but it seems like he is being asked to see first 1 doctor and then another doctor without any 1 provider assuming responsibility to treat his pain. ? ?I believe he is doing what is asked of him.  This is evidenced by the fact that his diabetes is much better controlled.  He continues to take Eliquis as asked.  He is compliant with drug screens ? ?Provider he saw yesterday is considering some superficial nerve block.  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  He also has appointment at [facility] for what sounds like ERCP to see if this might offer him some help. ? ?We spent some time talking about difficulty in treating chronic pain and how the system really seems to work against people with chronic pain syndrome of one type or another. ? ?Review of Systems:  ?Review of Systems  ?Constitutional: Negative.   ?Respiratory: Negative.    ?Cardiovascular: Negative.   ?Gastrointestinal:  Positive for abdominal pain. Negative for nausea and vomiting.  ?All other systems reviewed and are negative. ? ?Past Medical History:  ?Diagnosis Date  ? AKI (acute kidney injury) ([place]) 12/21/2021  ? Anxiety   ? Diabetes ([place])   ? Pancreatitis   ?  Splenic vein thrombosis   ? ?Past Surgical History:  ?Procedure Laterality Date  ? BIOPSY  12/02/2021  ? Procedure: BIOPSY;  Surgeon: Irving Copas., MD;  Location: [facility];  Service: Gastroenterology;;  ? ESOPHAGOGASTRODUODENOSCOPY (EGD) WITH PROPOFOL N/A 12/02/2021  ? Procedure: ESOPHAGOGASTRODUODENOSCOPY (EGD) WITH PROPOFOL;  Surgeon: Rush Landmark Telford Nab., MD;  Location: [facility];  Service: Gastroenterology;  Laterality: N/A;  ? NO PAST SURGERIES    ? UPPER ESOPHAGEAL ENDOSCOPIC ULTRASOUND (EUS) N/A 12/02/2021  ? Procedure: UPPER ESOPHAGEAL ENDOSCOPIC ULTRASOUND (EUS);  Surgeon: Irving Copas., MD;  Location: [facility];  Service: Gastroenterology;  Laterality: N/A;  ? ?Social History: ?  reports that he has been smoking cigars. He started smoking about 19 years ago. He has never used smokeless tobacco. He reports that he does not currently use alcohol. He reports that he does not use drugs. ? ?Family History  ?Problem Relation Age of Onset  ? Hypertension Mother   ? Alcoholism Maternal Uncle   ? Diabetes Maternal Grandmother   ? Diabetes Maternal Grandfather   ? Diabetes Paternal Grandmother   ? Diabetes Paternal Grandfather   ? Colon cancer Neg Hx   ? Esophageal cancer Neg Hx   ? Inflammatory bowel disease Neg Hx   ? Liver disease Neg Hx   ? Pancreatic cancer Neg Hx   ? Rectal cancer Neg Hx   ? Stomach cancer Neg Hx   ? ? ?  Medications: ?Carlos [name]'s Medications  ?New Prescriptions  ? No medications on file  ?Previous Medications  ? APIXABAN (ELIQUIS) 5 MG TABS TABLET    Take 1 tablet (5 mg total) by mouth 2 (two) times daily.  ? BLOOD GLUCOSE METER KIT AND SUPPLIES KIT    Dispense based on Carlos [name] and insurance preference. Use up to four times daily as directed.  ? CONTINUOUS BLOOD GLUC SENSOR (DEXCOM G6 SENSOR) MISC    1 Device by Does not apply route as directed.  ? CONTINUOUS BLOOD GLUC SENSOR (FREESTYLE LIBRE 2 SENSOR) MISC    Change every 14 days  ? CONTINUOUS BLOOD GLUC TRANSMIT  (DEXCOM G6 TRANSMITTER) MISC    1 Device by Does not apply route as directed.  ? HYDROMORPHONE (DILAUDID) 4 MG TABLET    Take 1 tablet (4 mg total) by mouth every 4 (four) hours as needed for severe pain.  ? INSULIN ASPART (NOVOLOG) 100 UNIT/ML FLEXPEN    Max daily 30 units  ? INSULIN DISPOSABLE PUMP (OMNIPOD 5 G6 INTRO, GEN 5,) KIT    1 Device by Does not apply route every 3 (three) days.  ? INSULIN DISPOSABLE PUMP (OMNIPOD 5 G6 POD, GEN 5,) MISC    1 Device by Does not apply route every 3 (three) days.  ? INSULIN GLARGINE (LANTUS) 100 UNIT/ML SOLOSTAR PEN    Inject 16 Units into the skin daily.  ? INSULIN PEN NEEDLE 32G X 4 MM MISC    1 Device by Does not apply route in the morning, at noon, in the evening, and at bedtime.  ? LIPASE/PROTEASE/AMYLASE (CREON) 36000 UNITS CPEP CAPSULE    Take 2 capsules (72,000 Units total) by mouth 3 (three) times daily with meals. May also take 1 capsule (36,000 Units total) as needed (with snacks).  ? MULTIPLE VITAMINS-MINERALS (ADVANCED DIABETIC MULTIVITAMIN PO)    Take 1 tablet by mouth daily.  ? OMEPRAZOLE (PRILOSEC) 40 MG CAPSULE    Take 1 capsule (40 mg total) by mouth daily.  ? ONDANSETRON (ZOFRAN) 8 MG TABLET    Take 1 tablet (8 mg total) by mouth every 8 (eight) hours as needed for nausea or vomiting.  ? OXYCODONE (ROXICODONE) 5 MG IMMEDIATE RELEASE TABLET    Take 1 tablet (5 mg total) by mouth every 6 (six) hours as needed for severe pain.  ?Modified Medications  ? No medications on file  ?Discontinued Medications  ? No medications on file  ? ? ?Physical Exam: ? ?Vitals:  ? 03/23/22 1455  ?Weight: 164 lb 6.4 oz (74.6 kg)  ?Height: '6\' 5"'  (1.956 m)  ? ?Body mass index is 19.49 kg/m?. ?Wt Readings from Last 3 Encounters:  ?03/23/22 164 lb 6.4 oz (74.6 kg)  ?03/10/22 157 lb 12.8 oz (71.6 kg)  ?03/05/22 152 lb 3.2 oz (69 kg)  ? ? ?Physical Exam ?Vitals and nursing note reviewed.  ?Constitutional:   ?   Appearance: Normal appearance.  ?Cardiovascular:  ?   Rate and Rhythm:  Normal rate and regular rhythm.  ?Pulmonary:  ?   Effort: Pulmonary effort is normal.  ?   Breath sounds: Normal breath sounds.  ?Abdominal:  ?   General: Bowel sounds are normal.  ?   Palpations: Abdomen is soft. There is no mass.  ?   Tenderness: There is abdominal tenderness. There is no guarding or rebound.  ?Neurological:  ?   Mental Status: He is alert.  ? ? ?Labs reviewed: ?Basic Metabolic Panel: ?Recent Labs  ?  09/15/21 ?0449 09/16/21 ?0447 10/09/21 ?1525 12/07/21 ?5631 12/08/21 ?0427 12/21/21 ?0342 12/21/21 ?0900 01/30/22 ?1346 01/31/22 ?0349 03/05/22 ?1239 03/10/22 ?1511  ?NA 131* 129*   < > 130*   < > 125*   < > 133* 132* 135  --   ?K 4.5 3.8   < > 3.2*   < > 4.3   < > 4.1 3.5 4.4  --   ?CL 96* 94*   < > 94*   < > 82*   < > 99 101 100  --   ?CO2 28 28   < > 24   < > 27   < > '25 24 26  ' --   ?GLUCOSE 211* 231*   < > 231*   < > 776*   < > 335* 288* 179*  --   ?BUN 11 14   < > 6   < > 20   < > '9 8 18  ' --   ?CREATININE 0.61 0.55*   < > 0.81   < > 1.17   < > 0.99 0.91 0.99  --   ?CALCIUM 8.7* 8.5*   < > 9.0   < > 10.5*   < > 9.1 8.5* 9.4  --   ?MG 1.7 2.0  --  1.6*  --  3.0*  --   --   --   --   --   ?PHOS 1.5* 3.6  --   --   --  5.8*  --   --   --   --   --   ?TSH  --   --   --   --   --   --   --   --   --   --  0.81  ? < > = values in this interval not displayed.  ? ?Liver Function Tests: ?Recent Labs  ?  01/30/22 ?1346 01/31/22 ?0349 03/05/22 ?1239  ?AST '22 15 20  ' ?ALT '18 15 14  ' ?ALKPHOS 99 89 81  ?BILITOT 0.6 0.5 1.1  ?PROT 7.8 6.3* 7.8  ?ALBUMIN 4.0 3.1* 4.4  ? ?Recent Labs  ?  01/30/22 ?1346 01/31/22 ?0349 03/05/22 ?1239  ?LIPASE 66* 65* 56.0  ?AMYLASE  --   --  73  ? ?No results for input(s): AMMONIA in the last 8760 hours. ?CBC: ?Recent Labs  ?  12/21/21 ?0342 12/22/21 ?1601 01/29/22 ?2244 01/30/22 ?1346 01/31/22 ?0349  ?WBC 8.2 6.6 3.8* 4.9 4.6  ?NEUTROABS 6.6 4,937 2.2  --   --   ?HGB 14.4 12.5* 11.9* 12.9* 11.4*  ?HCT 40.8 36.9* 35.7* 38.4* 34.8*  ?MCV 99.0 101.7* 100.6* 100.8* 99.7  ?PLT 489* 362  226 214 173  ? ?Lipid Panel: ?Recent Labs  ?  12/08/21 ?0427 03/10/22 ?1511  ?CHOL  --  104  ?HDL  --  56.50  ?LDLCALC  --  18  ?TRIG 35 149.0  ?CHOLHDL  --  2  ? ?TSH: ?Recent Labs  ?  03/10/22 ?1511  ?TSH 0

## 2022-03-25 LAB — DRUG MONITOR, PANEL 1, W/CONF, URINE
Amphetamines: NEGATIVE ng/mL (ref <500)
Barbiturates: NEGATIVE ng/mL (ref <300)
Benzodiazepines: NEGATIVE ng/mL (ref <100)
Cocaine Metabolite: NEGATIVE ng/mL (ref <150)
Codeine: NEGATIVE ng/mL (ref <50)
Creatinine: 29.8 mg/dL (ref >=20.0)
Hydrocodone: NEGATIVE ng/mL (ref <50)
Hydromorphone: 277 ng/mL — ABNORMAL HIGH (ref <50)
Marijuana Metabolite: NEGATIVE ng/mL (ref <20)
Methadone Metabolite: NEGATIVE ng/mL (ref <100)
Morphine: NEGATIVE ng/mL (ref <50)
Norhydrocodone: NEGATIVE ng/mL (ref <50)
Opiates: POSITIVE ng/mL — AB (ref <100)
Oxidant: NEGATIVE ug/mL (ref <200)
Oxycodone: NEGATIVE ng/mL (ref <100)
Phencyclidine: NEGATIVE ng/mL (ref <25)
pH: 6.7 (ref 4.5–9.0)

## 2022-03-25 LAB — DM TEMPLATE

## 2022-03-30 ENCOUNTER — Encounter: Payer: Self-pay | Admitting: Family Medicine

## 2022-03-30 ENCOUNTER — Ambulatory Visit (INDEPENDENT_AMBULATORY_CARE_PROVIDER_SITE_OTHER): Payer: Medicaid Other | Admitting: Family Medicine

## 2022-03-30 VITALS — BP 124/72 | HR 75 | Temp 97.3°F | Ht 77.0 in | Wt 165.7 lb

## 2022-03-30 DIAGNOSIS — E089 Diabetes mellitus due to underlying condition without complications: Secondary | ICD-10-CM

## 2022-03-30 DIAGNOSIS — F1020 Alcohol dependence, uncomplicated: Secondary | ICD-10-CM | POA: Diagnosis not present

## 2022-03-30 DIAGNOSIS — G8929 Other chronic pain: Secondary | ICD-10-CM | POA: Diagnosis not present

## 2022-03-30 DIAGNOSIS — R1013 Epigastric pain: Secondary | ICD-10-CM

## 2022-03-30 MED ORDER — HYDROMORPHONE HCL 4 MG PO TABS: 4.0000 mg | ORAL_TABLET | Freq: Four times a day (QID) | ORAL | 0 refills | Status: DC | PRN

## 2022-03-30 NOTE — Progress Notes (Signed)
Provider:  Alain Honey, MD  Careteam: Patient Care Team: [name], MD as PCP - General (Family Medicine) Jodi Marble, MD as Consulting Physician (Otolaryngology)  PLACE OF SERVICE:  [place] Directive information    Allergies  Allergen Reactions   Other Anaphylaxis    [name] syrup    Chief Complaint  Patient presents with   Medical Management of Chronic Issues    Patient presents today for a 1 week follow-up.     HPI: Patient is a 36 y.o. male patient returns 1 week after being seen.  I had considered switching him to methadone but I think he is doing well from pain control diabetes compliance with drug screens at will not change at this time.  When questioned about pain control 4 mg of Dilaudid controls pain.  There is some diminution in pain control as he approaches 6 hours but it is tolerable per history. He continues with Lantus insulin in the morning 16 units and about 5 units of NovoLog with meals. He continues to take Creon pancreatic enzymes.  He is gaining weight.  Review of Systems:  Review of Systems  HENT: Negative.    Respiratory: Negative.    Cardiovascular: Negative.   Gastrointestinal:  Positive for abdominal pain.  Genitourinary: Negative.   Musculoskeletal: Negative.   All other systems reviewed and are negative.  Past Medical History:  Diagnosis Date   AKI (acute kidney injury) ([place]) 12/21/2021   Anxiety    Diabetes ([place])    Pancreatitis    Splenic vein thrombosis    Past Surgical History:  Procedure Laterality Date   BIOPSY  12/02/2021   Procedure: BIOPSY;  Surgeon: Irving Copas., MD;  Location: [facility];  Service: Gastroenterology;;   ESOPHAGOGASTRODUODENOSCOPY (EGD) WITH PROPOFOL N/A 12/02/2021   Procedure: ESOPHAGOGASTRODUODENOSCOPY (EGD) WITH PROPOFOL;  Surgeon: Irving Copas., MD;  Location: [facility];  Service: Gastroenterology;  Laterality: N/A;   NO PAST SURGERIES     UPPER  ESOPHAGEAL ENDOSCOPIC ULTRASOUND (EUS) N/A 12/02/2021   Procedure: UPPER ESOPHAGEAL ENDOSCOPIC ULTRASOUND (EUS);  Surgeon: Irving Copas., MD;  Location: [facility];  Service: Gastroenterology;  Laterality: N/A;   Social History:   reports that he has been smoking cigars. He started smoking about 19 years ago. He has never used smokeless tobacco. He reports that he does not currently use alcohol. He reports that he does not use drugs.  Family History  Problem Relation Age of Onset   Hypertension Mother    Alcoholism Maternal Uncle    Diabetes Maternal Grandmother    Diabetes Maternal Grandfather    Diabetes Paternal Grandmother    Diabetes Paternal Grandfather    Colon cancer Neg Hx    Esophageal cancer Neg Hx    Inflammatory bowel disease Neg Hx    Liver disease Neg Hx    Pancreatic cancer Neg Hx    Rectal cancer Neg Hx    Stomach cancer Neg Hx     Medications: Patient's Medications  New Prescriptions   No medications on file  Previous Medications   APIXABAN (ELIQUIS) 5 MG TABS TABLET    Take 1 tablet (5 mg total) by mouth 2 (two) times daily.   BLOOD GLUCOSE METER KIT AND SUPPLIES KIT    Dispense based on patient and insurance preference. Use up to four times daily as directed.   CONTINUOUS BLOOD GLUC SENSOR (DEXCOM G6 SENSOR) MISC    1 Device by Does not apply route as  directed.   CONTINUOUS BLOOD GLUC SENSOR (FREESTYLE LIBRE 2 SENSOR) MISC    Change every 14 days   CONTINUOUS BLOOD GLUC TRANSMIT (DEXCOM G6 TRANSMITTER) MISC    1 Device by Does not apply route as directed.   HYDROMORPHONE (DILAUDID) 4 MG TABLET    Take 1 tablet (4 mg total) by mouth every 4 (four) hours as needed for up to 7 days for severe pain.   INSULIN ASPART (NOVOLOG) 100 UNIT/ML FLEXPEN    Max daily 30 units   INSULIN DISPOSABLE PUMP (OMNIPOD 5 G6 INTRO, GEN 5,) KIT    1 Device by Does not apply route every 3 (three) days.   INSULIN DISPOSABLE PUMP (OMNIPOD 5 G6 POD, GEN 5,) MISC    1 Device by  Does not apply route every 3 (three) days.   INSULIN GLARGINE (LANTUS) 100 UNIT/ML SOLOSTAR PEN    Inject 16 Units into the skin daily.   INSULIN PEN NEEDLE 32G X 4 MM MISC    1 Device by Does not apply route in the morning, at noon, in the evening, and at bedtime.   LIPASE/PROTEASE/AMYLASE (CREON) 36000 UNITS CPEP CAPSULE    Take 2 capsules (72,000 Units total) by mouth 3 (three) times daily with meals. May also take 1 capsule (36,000 Units total) as needed (with snacks).   MULTIPLE VITAMINS-MINERALS (ADVANCED DIABETIC MULTIVITAMIN PO)    Take 1 tablet by mouth daily.   OMEPRAZOLE (PRILOSEC) 40 MG CAPSULE    Take 1 capsule (40 mg total) by mouth daily.   ONDANSETRON (ZOFRAN) 8 MG TABLET    Take 1 tablet (8 mg total) by mouth every 8 (eight) hours as needed for nausea or vomiting.   OXYCODONE (ROXICODONE) 5 MG IMMEDIATE RELEASE TABLET    Take 1 tablet (5 mg total) by mouth every 6 (six) hours as needed for severe pain.  Modified Medications   No medications on file  Discontinued Medications   No medications on file    Physical Exam:  Vitals:   03/30/22 1451  BP: 124/72  Pulse: 75  Temp: (!) 97.3 F (36.3 C)  SpO2: 97%  Weight: 165 lb 11.2 oz (75.2 kg)  Height: 6' 5" (1.956 m)   Body mass index is 19.65 kg/m. Wt Readings from Last 3 Encounters:  03/30/22 165 lb 11.2 oz (75.2 kg)  03/23/22 164 lb 6.4 oz (74.6 kg)  03/10/22 157 lb 12.8 oz (71.6 kg)    Physical Exam Vitals and nursing note reviewed.  Constitutional:      Appearance: Normal appearance.  Cardiovascular:     Rate and Rhythm: Normal rate and regular rhythm.  Pulmonary:     Breath sounds: Normal breath sounds.  Abdominal:     General: Abdomen is flat. Bowel sounds are normal.     Tenderness: There is abdominal tenderness.     Comments: Tender and right upper quadrant  Neurological:     General: No focal deficit present.     Mental Status: He is alert and oriented to person, place, and time.  Psychiatric:         Mood and Affect: Mood normal.        Behavior: Behavior normal.    Labs reviewed: Basic Metabolic Panel: Recent Labs    09/15/21 0449 09/16/21 0447 10/09/21 1525 12/07/21 0549 12/08/21 0427 12/21/21 0342 12/21/21 0900 01/30/22 1346 01/31/22 0349 03/05/22 1239 03/10/22 1511  NA 131* 129*   < > 130*   < > 125*   < >  133* 132* 135  --   K 4.5 3.8   < > 3.2*   < > 4.3   < > 4.1 3.5 4.4  --   CL 96* 94*   < > 94*   < > 82*   < > 99 101 100  --   CO2 28 28   < > 24   < > 27   < > 25 24 26  --   GLUCOSE 211* 231*   < > 231*   < > 776*   < > 335* 288* 179*  --   BUN 11 14   < > 6   < > 20   < > 9 8 18  --   CREATININE 0.61 0.55*   < > 0.81   < > 1.17   < > 0.99 0.91 0.99  --   CALCIUM 8.7* 8.5*   < > 9.0   < > 10.5*   < > 9.1 8.5* 9.4  --   MG 1.7 2.0  --  1.6*  --  3.0*  --   --   --   --   --   PHOS 1.5* 3.6  --   --   --  5.8*  --   --   --   --   --   TSH  --   --   --   --   --   --   --   --   --   --  0.81   < > = values in this interval not displayed.   Liver Function Tests: Recent Labs    01/30/22 1346 01/31/22 0349 03/05/22 1239  AST 22 15 20  ALT 18 15 14  ALKPHOS 99 89 81  BILITOT 0.6 0.5 1.1  PROT 7.8 6.3* 7.8  ALBUMIN 4.0 3.1* 4.4   Recent Labs    01/30/22 1346 01/31/22 0349 03/05/22 1239  LIPASE 66* 65* 56.0  AMYLASE  --   --  73   No results for input(s): AMMONIA in the last 8760 hours. CBC: Recent Labs    12/21/21 0342 12/22/21 1601 01/29/22 2244 01/30/22 1346 01/31/22 0349  WBC 8.2 6.6 3.8* 4.9 4.6  NEUTROABS 6.6 4,937 2.2  --   --   HGB 14.4 12.5* 11.9* 12.9* 11.4*  HCT 40.8 36.9* 35.7* 38.4* 34.8*  MCV 99.0 101.7* 100.6* 100.8* 99.7  PLT 489* 362 226 214 173   Lipid Panel: Recent Labs    12/08/21 0427 03/10/22 1511  CHOL  --  104  HDL  --  56.50  LDLCALC  --  18  TRIG 35 149.0  CHOLHDL  --  2   TSH: Recent Labs    03/10/22 1511  TSH 0.81   A1C: Lab Results  Component Value Date   HGBA1C 6.9 (A) 03/10/2022      Assessment/Plan  1. Abdominal pain, chronic, epigastric Pain is controlled on her current regimen of Dilaudid every 6 hours  2. Uncomplicated alcohol dependence (HCC) Patient tells me he does not use alcohol anymore and I am inclined to believe him  3. Diabetes mellitus due to underlying condition without complication, without long-term current use of insulin (HCC) A1c earlier this month was 6.9    , MD [facility] [phone]   

## 2022-03-31 ENCOUNTER — Other Ambulatory Visit: Payer: Self-pay | Admitting: Family Medicine

## 2022-03-31 ENCOUNTER — Other Ambulatory Visit: Payer: Self-pay

## 2022-03-31 DIAGNOSIS — F1020 Alcohol dependence, uncomplicated: Secondary | ICD-10-CM

## 2022-03-31 DIAGNOSIS — G8929 Other chronic pain: Secondary | ICD-10-CM

## 2022-03-31 MED ORDER — HYDROMORPHONE HCL 4 MG PO TABS: 4.0000 mg | ORAL_TABLET | Freq: Four times a day (QID) | ORAL | 0 refills | Status: DC | PRN

## 2022-03-31 NOTE — Telephone Encounter (Signed)
Called and spoke with pharmacy, [facility] and they stated in order for a refill we would need to send in a new Prescription because they stated the Rx they received stated not to refill until 04/01/2022.  Pended Rx and sent to Dr. Hyacinth Meeker for approval.

## 2022-04-04 ENCOUNTER — Encounter: Payer: Self-pay | Admitting: Gastroenterology

## 2022-04-04 NOTE — Progress Notes (Signed)
[facility] follow-up Spink1 chronic pancreatitis DNA sequencing test Negative and no variant was detected   [name], MD [facility] Advanced Endoscopy Office # [phone]

## 2022-04-06 ENCOUNTER — Ambulatory Visit: Payer: Medicaid Other | Admitting: Family Medicine

## 2022-04-23 ENCOUNTER — Encounter (HOSPITAL_COMMUNITY): Payer: Self-pay

## 2022-04-23 ENCOUNTER — Emergency Department (HOSPITAL_COMMUNITY)
Admission: EM | Admit: 2022-04-23 | Discharge: 2022-04-23 | Disposition: A | Discharge status: Home / Self Care | Payer: Medicaid Other | Attending: Emergency Medicine | Admitting: Emergency Medicine

## 2022-04-23 ENCOUNTER — Emergency Department (HOSPITAL_COMMUNITY): Payer: Medicaid Other

## 2022-04-23 ENCOUNTER — Other Ambulatory Visit: Payer: Self-pay

## 2022-04-23 DIAGNOSIS — Z794 Long term (current) use of insulin: Secondary | ICD-10-CM | POA: Diagnosis not present

## 2022-04-23 DIAGNOSIS — E1065 Type 1 diabetes mellitus with hyperglycemia: Secondary | ICD-10-CM | POA: Insufficient documentation

## 2022-04-23 DIAGNOSIS — R109 Unspecified abdominal pain: Secondary | ICD-10-CM | POA: Diagnosis present

## 2022-04-23 DIAGNOSIS — Z7901 Long term (current) use of anticoagulants: Secondary | ICD-10-CM | POA: Insufficient documentation

## 2022-04-23 DIAGNOSIS — R1013 Epigastric pain: Secondary | ICD-10-CM | POA: Insufficient documentation

## 2022-04-23 DIAGNOSIS — K861 Other chronic pancreatitis: Secondary | ICD-10-CM

## 2022-04-23 LAB — CBC
HCT: 42.6 % (ref 39.0–52.0)
Hemoglobin: 14.7 g/dL (ref 13.0–17.0)
MCH: 33 pg (ref 26.0–34.0)
MCHC: 34.5 g/dL (ref 30.0–36.0)
MCV: 95.7 fL (ref 80.0–100.0)
Platelets: 185 10*3/uL (ref 150–400)
RBC: 4.45 MIL/uL (ref 4.22–5.81)
RDW: 14.4 % (ref 11.5–15.5)
WBC: 8 10*3/uL (ref 4.0–10.5)
nRBC: 0 % (ref 0.0–0.2)

## 2022-04-23 LAB — COMPREHENSIVE METABOLIC PANEL
ALT: 25 U/L (ref 0–44)
AST: 46 U/L — ABNORMAL HIGH (ref 15–41)
Albumin: 4.5 g/dL (ref 3.5–5.0)
Alkaline Phosphatase: 82 U/L (ref 38–126)
Anion gap: 9 (ref 5–15)
BUN: 9 mg/dL (ref 6–20)
CO2: 23 mmol/L (ref 22–32)
Calcium: 9.4 mg/dL (ref 8.9–10.3)
Chloride: 97 mmol/L — ABNORMAL LOW (ref 98–111)
Creatinine, Ser: 0.94 mg/dL (ref 0.61–1.24)
GFR, Estimated: >60 mL/min (ref >60)
Glucose, Bld: 240 mg/dL — ABNORMAL HIGH (ref 70–99)
Potassium: 3.5 mmol/L (ref 3.5–5.1)
Sodium: 129 mmol/L — ABNORMAL LOW (ref 135–145)
Total Bilirubin: 1.4 mg/dL — ABNORMAL HIGH (ref 0.3–1.2)
Total Protein: 8.3 g/dL — ABNORMAL HIGH (ref 6.5–8.1)

## 2022-04-23 LAB — LIPASE, BLOOD: Lipase: 183 U/L — ABNORMAL HIGH (ref 11–51)

## 2022-04-23 LAB — CBG MONITORING, ED
Glucose-Capillary: 548 mg/dL (ref 70–99)
Glucose-Capillary: 451 mg/dL — ABNORMAL HIGH (ref 70–99)
Glucose-Capillary: 341 mg/dL — ABNORMAL HIGH (ref 70–99)

## 2022-04-23 MED ORDER — HYDROMORPHONE HCL 1 MG/ML IJ SOLN: 1.0000 mg | Freq: Once | INTRAMUSCULAR | Status: DC

## 2022-04-23 MED ORDER — SODIUM CHLORIDE (PF) 0.9 % IJ SOLN
INTRAMUSCULAR | Status: AC
  Filled 2022-04-23: qty 50

## 2022-04-23 MED ORDER — HYDROMORPHONE HCL 2 MG/ML IJ SOLN
2.0000 mg | Freq: Once | INTRAMUSCULAR | Status: AC
  Administered 2022-04-23: 2 mg via INTRAVENOUS
  Filled 2022-04-23: qty 1

## 2022-04-23 MED ORDER — HYDROMORPHONE HCL 2 MG/ML IJ SOLN
2.0000 mg | Freq: Once | INTRAMUSCULAR | Status: DC
  Filled 2022-04-23: qty 1

## 2022-04-23 MED ORDER — PANCRELIPASE (LIP-PROT-AMYL) 36000-114000 UNITS PO CPEP
36000.0000 [IU] | ORAL_CAPSULE | Freq: Three times a day (TID) | ORAL | Status: DC | PRN
  Administered 2022-04-23: 36000 [IU] via ORAL
  Filled 2022-04-23 (×2): qty 1

## 2022-04-23 MED ORDER — ONDANSETRON HCL 8 MG PO TABS: 8.0000 mg | ORAL_TABLET | Freq: Three times a day (TID) | ORAL | 0 refills | Status: DC | PRN

## 2022-04-23 MED ORDER — INSULIN ASPART 100 UNIT/ML IJ SOLN
5.0000 [IU] | Freq: Once | INTRAMUSCULAR | Status: AC
  Administered 2022-04-23: 5 [IU] via SUBCUTANEOUS
  Filled 2022-04-23: qty 0.05

## 2022-04-23 MED ORDER — IOHEXOL 300 MG/ML  SOLN
100.0000 mL | Freq: Once | INTRAMUSCULAR | Status: AC | PRN
  Administered 2022-04-23: 100 mL via INTRAVENOUS

## 2022-04-23 MED ORDER — SODIUM CHLORIDE 0.9 % IV BOLUS
1000.0000 mL | Freq: Once | INTRAVENOUS | Status: AC
  Administered 2022-04-23: 1000 mL via INTRAVENOUS

## 2022-04-23 NOTE — ED Triage Notes (Signed)
Patient c/o mid upper abdominal pain/pressure that radiates into the right lower back and nausea x 2 days. Patient reports a history of pancreatitis

## 2022-04-23 NOTE — Discharge Instructions (Signed)
Continue your home hydromorphone pain medications that you get from your PCP.  I have sent in a prescription of antinausea medicine called Zofran to your pharmacy.  Continue slowly advance your diet as tolerated.  Follow-up with your PCP as planned on 6/20.

## 2022-04-23 NOTE — ED Provider Triage Note (Signed)
Emergency Medicine Provider Triage Evaluation Note  Carlos [name] , a 36 y.o. male  was evaluated in triage.  Pt complains of upper abdominal pain started 2 days ago.  Patient has a history of pancreatitis and states this feels similar.  He does report associated nausea but no vomiting.  No fever or chills.  Has not had any alcohol recently.  Review of Systems  Positive:  Negative: See above  Physical Exam  BP 122/83 (BP Location: Left Arm)   Pulse (!) 107   Temp 98.6 F (37 C) (Oral)   Resp 16   Ht 6\' 5"  (1.956 m)   Wt 73.9 kg   SpO2 98%   BMI 19.33 kg/m  Gen:   Awake, no distress   Resp:  Normal effort  MSK:   Moves extremities without difficulty  Other:  Diffuse tenderness to the upper abdomen.  Medical Decision Making  Medically screening exam initiated at 12:47 PM.  Appropriate orders placed.  was informed that the remainder of the evaluation will be completed by another provider, this initial triage assessment does not replace that evaluation, and the importance of remaining in the ED until their evaluation is complete.     [name] [place], [place] 04/23/22 1248

## 2022-04-23 NOTE — ED Provider Notes (Signed)
[facility] DEPT Provider Note   CSN: [id number] Arrival date & time: 04/23/22  1220  History Chief Complaint  Patient presents with   Abdominal Pain   Carlos [name] is a 36 y.o. male.  36 year old male presents with acute on chronic pancreatitis.  He reports he recently had some sort of "pain block" injection at [facility] on Wednesday, after which his abdominal pain worsened acutely. Records indicate this was a Right Rectus Abdominus Trigger Point Injection with 10cc's of a solution containing 0.25% bupivacaine and 11m Kenalog, performed by Dr. BCollier Flowersof the [facility] in spine clinic. He has not been able to manage at home with his home hydromorphone 4 mg.  PMH includes chronic pancreatitis, T1DM, protein calorie malnutrition, splenic vein thrombosis, alcohol dependence in remission, tobacco dependence.   Home Medications Prior to Admission medications   Medication Sig Start Date End Date Taking? Authorizing Provider  apixaban (ELIQUIS) 5 MG TABS tablet Take 1 tablet (5 mg total) by mouth 2 (two) times daily. 03/23/22   MWardell Honour MD  blood glucose meter kit and supplies KIT Dispense based on patient and insurance preference. Use up to four times daily as directed. 09/16/21   EAlma Friendly MD  Continuous Blood Gluc Sensor (DEXCOM G6 SENSOR) MISC 1 Device by Does not apply route as directed. 03/10/22   [name], IMelanie Crazier MD  Continuous Blood Gluc Sensor (FREESTYLE LIBRE 2 SENSOR) MISC Change every 14 days 03/22/22   [name], IMelanie Crazier MD  Continuous Blood Gluc Transmit (DEXCOM G6 TRANSMITTER) MISC 1 Device by Does not apply route as directed. 03/10/22   [name], IMelanie Crazier MD  HYDROmorphone (DILAUDID) 4 MG tablet Take 1 tablet (4 mg total) by mouth every 6 (six) hours as needed for severe pain. 03/31/22 04/30/22  MWardell Honour MD  insulin aspart (NOVOLOG) 100 UNIT/ML FlexPen Max daily 30 units 03/10/22    [name], IMelanie Crazier MD  Insulin Disposable Pump (OMNIPOD 5 G6 INTRO, GEN 5,) KIT 1 Device by Does not apply route every 3 (three) days. 03/10/22   [name], IMelanie Crazier MD  Insulin Disposable Pump (OMNIPOD 5 G6 POD, GEN 5,) MISC 1 Device by Does not apply route every 3 (three) days. 03/10/22   [name], IMelanie Crazier MD  insulin glargine (LANTUS) 100 UNIT/ML Solostar Pen Inject 16 Units into the skin daily. 03/10/22   [name], IMelanie Crazier MD  Insulin Pen Needle 32G X 4 MM MISC 1 Device by Does not apply route in the morning, at noon, in the evening, and at bedtime. 03/10/22   [name], IMelanie Crazier MD  lipase/protease/amylase (CREON) 36000 UNITS CPEP capsule Take 2 capsules (72,000 Units total) by mouth 3 (three) times daily with meals. May also take 1 capsule (36,000 Units total) as needed (with snacks). 01/01/22   [name], GTelford Nab, MD  Multiple Vitamins-Minerals (ADVANCED DIABETIC MULTIVITAMIN PO) Take 1 tablet by mouth daily.    [provider]  omeprazole (PRILOSEC) 40 MG capsule Take 1 capsule (40 mg total) by mouth daily. 02/03/22   MWardell Honour MD  ondansetron (ZOFRAN) 8 MG tablet Take 1 tablet (8 mg total) by mouth every 8 (eight) hours as needed for nausea or vomiting. 12/02/21   [name], GTelford Nab, MD  oxyCODONE (ROXICODONE) 5 MG immediate release tablet Take 1 tablet (5 mg total) by mouth every 6 (six) hours as needed for severe pain. 03/05/22   [name], GTelford Nab, MD     Allergies  Other    Review of Systems   Review of Systems  Gastrointestinal:  Positive for abdominal pain and nausea. Negative for abdominal distention, constipation, diarrhea and vomiting.  Genitourinary:  Positive for flank pain. Negative for difficulty urinating and hematuria.  Musculoskeletal:  Positive for back pain.    Physical Exam Updated Vital Signs BP (!) 146/105   Pulse 94   Temp 98.2 F (36.8 C) (Oral)   Resp 17   Ht '6\' 5"'  (1.956 m)   Wt  73.9 kg   SpO2 97%   BMI 19.33 kg/m  Physical Exam Vitals and nursing note reviewed.  Constitutional:      General: He is not in acute distress.    Appearance: He is well-developed and normal weight. He is not ill-appearing or toxic-appearing.  HENT:     Head: Normocephalic and atraumatic.  Eyes:     General: No scleral icterus. Cardiovascular:     Rate and Rhythm: Normal rate and regular rhythm.     Heart sounds: Normal heart sounds.  Pulmonary:     Effort: Pulmonary effort is normal.     Breath sounds: Normal breath sounds.  Abdominal:     General: Abdomen is flat. Bowel sounds are normal. There is no distension.     Palpations: Abdomen is soft.     Tenderness: There is abdominal tenderness in the epigastric area.  Skin:    Capillary Refill: Capillary refill takes less than 2 seconds.  Neurological:     General: No focal deficit present.     Mental Status: He is alert.  Psychiatric:        Mood and Affect: Mood normal.        Behavior: Behavior normal.     ED Results / Procedures / Treatments   Labs (all labs ordered are listed, but only abnormal results are displayed) Labs Reviewed  LIPASE, BLOOD - Abnormal; Notable for the following components:      Result Value   Lipase 183 (*)    All other components within normal limits  COMPREHENSIVE METABOLIC PANEL - Abnormal; Notable for the following components:   Sodium 129 (*)    Chloride 97 (*)    Glucose, Bld 240 (*)    Total Protein 8.3 (*)    AST 46 (*)    Total Bilirubin 1.4 (*)    All other components within normal limits  CBG MONITORING, ED - Abnormal; Notable for the following components:   Glucose-Capillary 548 (*)    All other components within normal limits  CBC  URINALYSIS, ROUTINE W REFLEX MICROSCOPIC    EKG None  Radiology CT ABDOMEN PELVIS W CONTRAST  Result Date: 04/23/2022 CLINICAL DATA:  Acute nonlocalized abdominal pain. History of pancreatitis. Patient reports mid upper abdominal pain and  pressure. Nausea. EXAM: CT ABDOMEN AND PELVIS WITH CONTRAST TECHNIQUE: Multidetector CT imaging of the abdomen and pelvis was performed using the standard protocol following bolus administration of intravenous contrast. RADIATION DOSE REDUCTION: This exam was performed according to the departmental dose-optimization program which includes automated exposure control, adjustment of the mA and/or kV according to patient size and/or use of iterative reconstruction technique. CONTRAST:  138m OMNIPAQUE IOHEXOL 300 MG/ML  SOLN COMPARISON:  Most recent CT 01/30/2022 FINDINGS: Lower chest: No acute airspace disease or pleural effusion. The heart is normal in size. Hepatobiliary: Mild hepatic steatosis. Heterogeneous hepatic parenchyma without discrete liver lesion. Gallbladder physiologically distended, no calcified stone. No biliary dilatation. 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 Pancreas: Moderate peripancreatic fat stranding. Trace free fluid tracks into the bilateral pericolic gutters. Background chronic pancreatitis with pancreatic calcifications. 14 mm fluid collection in the pancreatic tail may represent a pseudocyst or focal ductal dilatation, this is diminished in size from prior. The additional peripancreatic collection on prior exam has resolved. No new peripancreatic collection. Spleen: Normal in size without focal abnormality. The splenic vein is patent. Adrenals/Urinary Tract: Normal adrenal glands. No hydronephrosis or perinephric edema. Homogeneous renal enhancement. No renal calculi or focal lesion. Urinary bladder is physiologically distended without wall thickening. Stomach/Bowel: Bowel assessment is limited in the absence of enteric contrast and paucity of intra-abdominal fat. Mild distal esophageal wall thickening. Suspected mild wall thickening of the stomach and duodenum, likely reactive in the setting of  pancreatitis. No small bowel distension or evidence of obstruction. Normal appendix. Majority of the colon is decompressed which limits assessment for wall thickening. 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 The splenic vein is patent. There is heterogeneous enhancement of the superior mesenteric vein, but no definite intraluminal thrombus. Normal caliber abdominal aorta. Small peripancreatic and perigastric lymph nodes are typically reactive. No enlarged lymph nodes by size criteria. Reproductive: Prostate is unremarkable. Other: Edematous changes centered on the pancreas with trace free fluid tracking into the pericolic gutters. No large volume ascites. Musculoskeletal: There are no acute or suspicious osseous abnormalities. IMPRESSION: 1. Acute on chronic pancreatitis. Moderate peripancreatic fat stranding with trace free fluid tracking into the bilateral pericolic gutters. Decreased size of pancreatic collection in the tail. The more proximal collection has resolved from prior exam. No new peripancreatic collection. 2. 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 3. Heterogeneous enhancement of the superior mesenteric vein, but no definite intraluminal thrombus. 4. Mild distal esophageal wall thickening, reactive in the setting of pancreatitis versus reflux. 5. Mild hepatic steatosis. Electronically Signed   By: Keith Rake M.D.   On: 04/23/2022 16:32    Procedures Procedures   Medications Ordered in ED Medications  sodium chloride (PF) 0.9 % injection (has no administration in time range)  sodium chloride 0.9 % bolus 1,000 mL (has no administration in time range)  iohexol (OMNIPAQUE) 300 MG/ML solution 100 mL (100 mLs Intravenous Contrast Given 04/23/22 1612)    ED Course/ Medical Decision Making/ A&P                           Medical  Decision Making 36 year old male presents with acute on chronic pancreatitis, diagnosed by physical exam, lipase, and CT.  Did well with Dilaudid 2 mg x2 and IV fluids.  Tolerated p.o. fluids well without nausea or vomiting. Discharged home with zofran.  Patient to use home hydromorphone 4 mg tablets, prescribed by PCP for chronic pancreatitis.  He has follow-up PCP on 6/20 and is also established with GI.  Amount and/or Complexity of Data Reviewed Labs: ordered.  Risk Prescription drug management.   Final Clinical Impression(s) / ED Diagnoses Final diagnoses:  None   Rx / DC Orders ED Discharge Orders     None      Ezequiel Essex, MD    Ezequiel Essex, MD 04/23/22 2226    Drenda Freeze, MD 04/23/22 [phone]

## 2022-04-26 ENCOUNTER — Other Ambulatory Visit: Payer: Self-pay

## 2022-04-26 ENCOUNTER — Encounter (HOSPITAL_COMMUNITY): Payer: Self-pay

## 2022-04-26 ENCOUNTER — Inpatient Hospital Stay (HOSPITAL_COMMUNITY)
Admission: EM | Admit: 2022-04-26 | Discharge: 2022-04-30 | DRG: 438 | Disposition: A | Discharge status: Home / Self Care | Payer: Medicaid Other | Attending: Internal Medicine | Admitting: Internal Medicine

## 2022-04-26 DIAGNOSIS — Z794 Long term (current) use of insulin: Secondary | ICD-10-CM

## 2022-04-26 DIAGNOSIS — E1010 Type 1 diabetes mellitus with ketoacidosis without coma: Secondary | ICD-10-CM | POA: Diagnosis present

## 2022-04-26 DIAGNOSIS — R739 Hyperglycemia, unspecified: Secondary | ICD-10-CM

## 2022-04-26 DIAGNOSIS — I81 Portal vein thrombosis: Secondary | ICD-10-CM | POA: Diagnosis present

## 2022-04-26 DIAGNOSIS — K858 Other acute pancreatitis without necrosis or infection: Principal | ICD-10-CM | POA: Diagnosis present

## 2022-04-26 DIAGNOSIS — Z7901 Long term (current) use of anticoagulants: Secondary | ICD-10-CM

## 2022-04-26 DIAGNOSIS — F17290 Nicotine dependence, other tobacco product, uncomplicated: Secondary | ICD-10-CM | POA: Diagnosis present

## 2022-04-26 DIAGNOSIS — K2100 Gastro-esophageal reflux disease with esophagitis, without bleeding: Secondary | ICD-10-CM | POA: Diagnosis present

## 2022-04-26 DIAGNOSIS — Z91018 Allergy to other foods: Secondary | ICD-10-CM

## 2022-04-26 DIAGNOSIS — Z86718 Personal history of other venous thrombosis and embolism: Secondary | ICD-10-CM

## 2022-04-26 DIAGNOSIS — K2970 Gastritis, unspecified, without bleeding: Secondary | ICD-10-CM | POA: Diagnosis present

## 2022-04-26 DIAGNOSIS — Z79899 Other long term (current) drug therapy: Secondary | ICD-10-CM

## 2022-04-26 DIAGNOSIS — F1011 Alcohol abuse, in remission: Secondary | ICD-10-CM | POA: Diagnosis present

## 2022-04-26 DIAGNOSIS — Z8249 Family history of ischemic heart disease and other diseases of the circulatory system: Secondary | ICD-10-CM

## 2022-04-26 DIAGNOSIS — Z833 Family history of diabetes mellitus: Secondary | ICD-10-CM

## 2022-04-26 DIAGNOSIS — K861 Other chronic pancreatitis: Secondary | ICD-10-CM | POA: Diagnosis present

## 2022-04-26 DIAGNOSIS — K863 Pseudocyst of pancreas: Secondary | ICD-10-CM | POA: Diagnosis present

## 2022-04-26 DIAGNOSIS — K859 Acute pancreatitis without necrosis or infection, unspecified: Principal | ICD-10-CM | POA: Diagnosis present

## 2022-04-26 DIAGNOSIS — F419 Anxiety disorder, unspecified: Secondary | ICD-10-CM | POA: Diagnosis present

## 2022-04-26 DIAGNOSIS — E869 Volume depletion, unspecified: Secondary | ICD-10-CM | POA: Diagnosis present

## 2022-04-26 DIAGNOSIS — G8929 Other chronic pain: Secondary | ICD-10-CM | POA: Diagnosis present

## 2022-04-26 DIAGNOSIS — E876 Hypokalemia: Secondary | ICD-10-CM | POA: Diagnosis not present

## 2022-04-26 LAB — URINALYSIS, ROUTINE W REFLEX MICROSCOPIC
Bacteria, UA: NONE SEEN
Bilirubin Urine: NEGATIVE
Glucose, UA: >=500 mg/dL — AB
Hgb urine dipstick: NEGATIVE
Ketones, ur: 80 mg/dL — AB
Leukocytes,Ua: NEGATIVE
Nitrite: NEGATIVE
Protein, ur: NEGATIVE mg/dL
Specific Gravity, Urine: 1.031 — ABNORMAL HIGH (ref 1.005–1.030)
pH: 6 (ref 5.0–8.0)

## 2022-04-26 LAB — CBC WITH DIFFERENTIAL/PLATELET
Abs Immature Granulocytes: 0.03 10*3/uL (ref 0.00–0.07)
Basophils Absolute: 0 10*3/uL (ref 0.0–0.1)
Basophils Relative: 0 %
Eosinophils Absolute: 0 10*3/uL (ref 0.0–0.5)
Eosinophils Relative: 1 %
HCT: 41.6 % (ref 39.0–52.0)
Hemoglobin: 13.8 g/dL (ref 13.0–17.0)
Immature Granulocytes: 0 %
Lymphocytes Relative: 8 %
Lymphs Abs: 0.6 10*3/uL — ABNORMAL LOW (ref 0.7–4.0)
MCH: 32.7 pg (ref 26.0–34.0)
MCHC: 33.2 g/dL (ref 30.0–36.0)
MCV: 98.6 fL (ref 80.0–100.0)
Monocytes Absolute: 0.7 10*3/uL (ref 0.1–1.0)
Monocytes Relative: 10 %
Neutro Abs: 5.9 10*3/uL (ref 1.7–7.7)
Neutrophils Relative %: 81 %
Platelets: 209 10*3/uL (ref 150–400)
RBC: 4.22 MIL/uL (ref 4.22–5.81)
RDW: 14.4 % (ref 11.5–15.5)
WBC: 7.3 10*3/uL (ref 4.0–10.5)
nRBC: 0 % (ref 0.0–0.2)

## 2022-04-26 LAB — COMPREHENSIVE METABOLIC PANEL
ALT: 30 U/L (ref 0–44)
AST: 21 U/L (ref 15–41)
Albumin: 4 g/dL (ref 3.5–5.0)
Alkaline Phosphatase: 86 U/L (ref 38–126)
Anion gap: 15 (ref 5–15)
BUN: 8 mg/dL (ref 6–20)
CO2: 20 mmol/L — ABNORMAL LOW (ref 22–32)
Calcium: 9.2 mg/dL (ref 8.9–10.3)
Chloride: 96 mmol/L — ABNORMAL LOW (ref 98–111)
Creatinine, Ser: 1.03 mg/dL (ref 0.61–1.24)
GFR, Estimated: >60 mL/min (ref >60)
Glucose, Bld: 504 mg/dL (ref 70–99)
Potassium: 4.5 mmol/L (ref 3.5–5.1)
Sodium: 131 mmol/L — ABNORMAL LOW (ref 135–145)
Total Bilirubin: 1.4 mg/dL — ABNORMAL HIGH (ref 0.3–1.2)
Total Protein: 8 g/dL (ref 6.5–8.1)

## 2022-04-26 LAB — CBG MONITORING, ED: Glucose-Capillary: 438 mg/dL — ABNORMAL HIGH (ref 70–99)

## 2022-04-26 LAB — LIPASE, BLOOD: Lipase: 89 U/L — ABNORMAL HIGH (ref 11–51)

## 2022-04-26 MED ORDER — LACTATED RINGERS IV BOLUS
1000.0000 mL | Freq: Once | INTRAVENOUS | Status: AC
  Administered 2022-04-26: 1000 mL via INTRAVENOUS

## 2022-04-26 MED ORDER — HYDROMORPHONE HCL 2 MG/ML IJ SOLN
2.0000 mg | Freq: Once | INTRAMUSCULAR | Status: AC
  Administered 2022-04-26: 2 mg via INTRAVENOUS
  Filled 2022-04-26: qty 1

## 2022-04-26 MED ORDER — ONDANSETRON HCL 4 MG/2ML IJ SOLN
4.0000 mg | Freq: Once | INTRAMUSCULAR | Status: AC
  Administered 2022-04-26: 4 mg via INTRAVENOUS
  Filled 2022-04-26: qty 2

## 2022-04-26 MED ORDER — INSULIN ASPART 100 UNIT/ML IJ SOLN
8.0000 [IU] | Freq: Once | INTRAMUSCULAR | Status: AC
  Administered 2022-04-26: 8 [IU] via INTRAVENOUS
  Filled 2022-04-26: qty 0.08

## 2022-04-26 NOTE — ED Provider Notes (Signed)
[facility] DEPT Provider Note   CSN: [id number] Arrival date & time: 04/26/22  1624     History  Chief Complaint  Patient presents with   Abdominal Pain   Emesis    Carlos [name] is a 36 y.o. male.   Abdominal Pain Associated symptoms: vomiting   Emesis Associated symptoms: abdominal pain   Patient presents with continued abdominal pain nausea and vomiting.  Recently seen and diagnosed with acute on chronic pancreatitis.  Does have a history of chronic alcoholic pancreatitis.  Has not had a drink however in 3 years.  Recently did have a nerve block to help with pain.  He is on chronic pain meds at home.  States he was told her sugars would go high.  Seen in the ER 3 days ago with a lipase 180.  CT scan showed acute on chronic pancreatitis.  States he has not been able to eat much at home.  States eating makes the pain worse.    Past Medical History:  Diagnosis Date   AKI (acute kidney injury) ([place]) 12/21/2021   Anxiety    Diabetes ([place])    Pancreatitis    Splenic vein thrombosis     Home Medications Prior to Admission medications   Medication Sig Start Date End Date Taking? Authorizing Provider  apixaban (ELIQUIS) 5 MG TABS tablet Take 1 tablet (5 mg total) by mouth 2 (two) times daily. 03/23/22  Yes [name], MD  HYDROmorphone (DILAUDID) 4 MG tablet Take 1 tablet (4 mg total) by mouth every 6 (six) hours as needed for severe pain. 03/31/22 04/30/22 Yes [name], MD  insulin aspart (NOVOLOG) 100 UNIT/ML FlexPen Max daily 30 units 03/10/22  Yes [name], Melanie Crazier, MD  insulin glargine (LANTUS) 100 UNIT/ML Solostar Pen Inject 16 Units into the skin daily. 03/10/22  Yes [name], Melanie Crazier, MD  lipase/protease/amylase (CREON) 36000 UNITS CPEP capsule Take 2 capsules (72,000 Units total) by mouth 3 (three) times daily with meals. May also take 1 capsule (36,000 Units total) as needed (with snacks). 01/01/22  Yes [name],  Telford Nab., MD  Multiple Vitamins-Minerals (ADVANCED DIABETIC MULTIVITAMIN PO) Take 1 tablet by mouth daily.   Yes [provider]  omeprazole (PRILOSEC) 40 MG capsule Take 1 capsule (40 mg total) by mouth daily. 02/03/22  Yes [name], MD  ondansetron (ZOFRAN) 8 MG tablet Take 1 tablet (8 mg total) by mouth every 8 (eight) hours as needed for nausea or vomiting. 04/23/22  Yes Ezequiel Essex, MD  blood glucose meter kit and supplies KIT Dispense based on patient and insurance preference. Use up to four times daily as directed. 09/16/21   Alma Friendly, MD  Continuous Blood Gluc Sensor (DEXCOM G6 SENSOR) MISC 1 Device by Does not apply route as directed. 03/10/22   [name], Melanie Crazier, MD  Continuous Blood Gluc Sensor (FREESTYLE LIBRE 2 SENSOR) MISC Change every 14 days 03/22/22   [name], Melanie Crazier, MD  Continuous Blood Gluc Transmit (DEXCOM G6 TRANSMITTER) MISC 1 Device by Does not apply route as directed. 03/10/22   [name], Melanie Crazier, MD  Insulin Disposable Pump (OMNIPOD 5 G6 INTRO, GEN 5,) KIT 1 Device by Does not apply route every 3 (three) days. 03/10/22   [name], Melanie Crazier, MD  Insulin Disposable Pump (OMNIPOD 5 G6 POD, GEN 5,) MISC 1 Device by Does not apply route every 3 (three) days. 03/10/22   [name], Melanie Crazier, MD  Insulin Pen Needle 32G X 4  MM MISC 1 Device by Does not apply route in the morning, at noon, in the evening, and at bedtime. 03/10/22   [name], Melanie Crazier, MD  ondansetron (ZOFRAN) 8 MG tablet Take 1 tablet (8 mg total) by mouth every 8 (eight) hours as needed for nausea or vomiting. Patient not taking: Reported on 04/23/2022 12/02/21   [name], Telford Nab., MD  oxyCODONE (ROXICODONE) 5 MG immediate release tablet Take 1 tablet (5 mg total) by mouth every 6 (six) hours as needed for severe pain. Patient not taking: Reported on 04/23/2022 03/05/22   [name], Telford Nab., MD      Allergies    Other     Review of Systems   Review of Systems  Gastrointestinal:  Positive for abdominal pain and vomiting.    Physical Exam Updated Vital Signs BP (!) 144/92   Pulse 79   Temp 98.1 F (36.7 C) (Oral)   Resp 14   Ht '6\' 5"'  (1.956 m)   Wt 72.6 kg   SpO2 100%   BMI 18.97 kg/m  Physical Exam Vitals and nursing note reviewed.  HENT:     Head: Atraumatic.  Cardiovascular:     Rate and Rhythm: Normal rate.  Pulmonary:     Breath sounds: Normal breath sounds.  Abdominal:     Hernia: No hernia is present.     Comments: Mild upper abdominal tenderness.  No rebound or guarding.  No hernia palpated.  Skin:    General: Skin is warm.     Capillary Refill: Capillary refill takes less than 2 seconds.  Neurological:     Mental Status: He is alert and oriented to person, place, and time.     ED Results / Procedures / Treatments   Labs (all labs ordered are listed, but only abnormal results are displayed) Labs Reviewed  COMPREHENSIVE METABOLIC PANEL - Abnormal; Notable for the following components:      Result Value   Sodium 131 (*)    Chloride 96 (*)    CO2 20 (*)    Glucose, Bld 504 (*)    Total Bilirubin 1.4 (*)    All other components within normal limits  LIPASE, BLOOD - Abnormal; Notable for the following components:   Lipase 89 (*)    All other components within normal limits  CBC WITH DIFFERENTIAL/PLATELET - Abnormal; Notable for the following components:   Lymphs Abs 0.6 (*)    All other components within normal limits  URINALYSIS, ROUTINE W REFLEX MICROSCOPIC - Abnormal; Notable for the following components:   Color, Urine STRAW (*)    Specific Gravity, Urine 1.031 (*)    Glucose, UA >=500 (*)    Ketones, ur 80 (*)    All other components within normal limits  CBG MONITORING, ED - Abnormal; Notable for the following components:   Glucose-Capillary 438 (*)    All other components within normal limits  CBG MONITORING, ED    EKG None  Radiology No results  found.  Procedures Procedures    Medications Ordered in ED Medications  HYDROmorphone (DILAUDID) injection 2 mg (2 mg Intravenous Given 04/26/22 1937)  lactated ringers bolus 1,000 mL (1,000 mLs Intravenous New Bag/Given 04/26/22 1938)  ondansetron (ZOFRAN) injection 4 mg (4 mg Intravenous Given 04/26/22 1937)  insulin aspart (novoLOG) injection 8 Units (8 Units Intravenous Given 04/26/22 2106)  lactated ringers bolus 1,000 mL (1,000 mLs Intravenous New Bag/Given 04/26/22 2106)    ED Course/ Medical Decision Making/ A&P  Medical Decision Making Risk Prescription drug management.   Patient returns ER with continued abdominal pain.  Epigastric pain.  Differential diagnosis includes different conditions but is most likely the pancreatitis that he was recently diagnosed with.  Has gone home and is on chronic oral opiates.  States he has really been unable to eat or drink much because it did cause increase in the pain.  Reviewed CT scan from recent admission.  Also about 5 days ago had a trigger point injection with some steroids in the abdomen.  Since then sugars have been going up.  Does not have an anion gap but does have 80 ketones in the urine.  Sugars have been 500.  Given fluid boluses and given IV insulin.  Continued pain even with Dilaudid IV.  States that nausea is improved somewhat however.  With failure of outpatient management at this point I think he would benefit from admission to the hospital for fluids and to help get the sugar control along with his pain control.  Will discuss with hospitalist.        Final Clinical Impression(s) / ED Diagnoses Final diagnoses:  Acute pancreatitis without infection or necrosis, unspecified pancreatitis type  Hyperglycemia    Rx / DC Orders ED Discharge Orders     None         Davonna Belling, MD 04/26/22 2249

## 2022-04-26 NOTE — ED Notes (Signed)
Patient given food and a beverage. ? ?

## 2022-04-26 NOTE — ED Triage Notes (Addendum)
Patient c/o mid upper abdominal pain x 3 days. Patient was seen 3 days ago for the same. Patient also c/o N/V.

## 2022-04-26 NOTE — ED Provider Triage Note (Signed)
Emergency Medicine Provider Triage Evaluation Note  Carlos [name] , a 36 y.o. male  was evaluated in triage.  Pt complains of abdominal pain, nausea, vomiting.  He was initially seen for same on 6/16 and was diagnosed with pancreatitis.  His pain was able to get under control and he felt that he could manage at home and was subsequently discharged.  He states that since being discharged his pain is only worsened.  He is also out of his home hydromorphone that he takes for his chronic pain.  He states that he has been continually unable to tolerate oral intake without nausea and vomiting.  Review of Systems  Positive:  Negative:  Physical Exam  BP 133/88 (BP Location: Left Arm)   Pulse 94   Temp 98.1 F (36.7 C) (Oral)   Resp 16   Ht 6\' 5"  (1.956 m)   Wt 72.6 kg   SpO2 100%   BMI 18.97 kg/m  Gen:   Awake, no distress   Resp:  Normal effort  MSK:   Moves extremities without difficulty Other:    Medical Decision Making  Medically screening exam initiated at 5:08 PM.  Appropriate orders placed.  was informed that the remainder of the evaluation will be completed by another provider, this initial triage assessment does not replace that evaluation, and the importance of remaining in the ED until their evaluation is complete.     [name], PA-C 04/26/22 1709

## 2022-04-27 ENCOUNTER — Encounter: Payer: Self-pay | Admitting: Family Medicine

## 2022-04-27 ENCOUNTER — Encounter (HOSPITAL_COMMUNITY): Payer: Self-pay | Admitting: Internal Medicine

## 2022-04-27 ENCOUNTER — Encounter: Payer: Medicaid Other | Admitting: Family Medicine

## 2022-04-27 DIAGNOSIS — Z86718 Personal history of other venous thrombosis and embolism: Secondary | ICD-10-CM | POA: Diagnosis not present

## 2022-04-27 DIAGNOSIS — R1013 Epigastric pain: Secondary | ICD-10-CM | POA: Diagnosis not present

## 2022-04-27 DIAGNOSIS — F1011 Alcohol abuse, in remission: Secondary | ICD-10-CM | POA: Diagnosis present

## 2022-04-27 DIAGNOSIS — I81 Portal vein thrombosis: Secondary | ICD-10-CM | POA: Diagnosis present

## 2022-04-27 DIAGNOSIS — F419 Anxiety disorder, unspecified: Secondary | ICD-10-CM | POA: Diagnosis present

## 2022-04-27 DIAGNOSIS — Z8249 Family history of ischemic heart disease and other diseases of the circulatory system: Secondary | ICD-10-CM | POA: Diagnosis not present

## 2022-04-27 DIAGNOSIS — Z91018 Allergy to other foods: Secondary | ICD-10-CM | POA: Diagnosis not present

## 2022-04-27 DIAGNOSIS — K863 Pseudocyst of pancreas: Secondary | ICD-10-CM | POA: Diagnosis present

## 2022-04-27 DIAGNOSIS — K861 Other chronic pancreatitis: Secondary | ICD-10-CM

## 2022-04-27 DIAGNOSIS — E1010 Type 1 diabetes mellitus with ketoacidosis without coma: Secondary | ICD-10-CM

## 2022-04-27 DIAGNOSIS — E869 Volume depletion, unspecified: Secondary | ICD-10-CM | POA: Diagnosis present

## 2022-04-27 DIAGNOSIS — K2100 Gastro-esophageal reflux disease with esophagitis, without bleeding: Secondary | ICD-10-CM | POA: Diagnosis present

## 2022-04-27 DIAGNOSIS — E876 Hypokalemia: Secondary | ICD-10-CM | POA: Diagnosis not present

## 2022-04-27 DIAGNOSIS — Z833 Family history of diabetes mellitus: Secondary | ICD-10-CM | POA: Diagnosis not present

## 2022-04-27 DIAGNOSIS — Z79899 Other long term (current) drug therapy: Secondary | ICD-10-CM | POA: Diagnosis not present

## 2022-04-27 DIAGNOSIS — K860 Alcohol-induced chronic pancreatitis: Secondary | ICD-10-CM | POA: Diagnosis not present

## 2022-04-27 DIAGNOSIS — K858 Other acute pancreatitis without necrosis or infection: Secondary | ICD-10-CM | POA: Diagnosis present

## 2022-04-27 DIAGNOSIS — F17290 Nicotine dependence, other tobacco product, uncomplicated: Secondary | ICD-10-CM | POA: Diagnosis present

## 2022-04-27 DIAGNOSIS — R739 Hyperglycemia, unspecified: Secondary | ICD-10-CM | POA: Diagnosis present

## 2022-04-27 DIAGNOSIS — F1020 Alcohol dependence, uncomplicated: Secondary | ICD-10-CM | POA: Diagnosis not present

## 2022-04-27 DIAGNOSIS — G8929 Other chronic pain: Secondary | ICD-10-CM | POA: Diagnosis present

## 2022-04-27 DIAGNOSIS — K859 Acute pancreatitis without necrosis or infection, unspecified: Secondary | ICD-10-CM | POA: Diagnosis not present

## 2022-04-27 DIAGNOSIS — Z794 Long term (current) use of insulin: Secondary | ICD-10-CM | POA: Diagnosis not present

## 2022-04-27 DIAGNOSIS — K2970 Gastritis, unspecified, without bleeding: Secondary | ICD-10-CM | POA: Diagnosis present

## 2022-04-27 DIAGNOSIS — Z7901 Long term (current) use of anticoagulants: Secondary | ICD-10-CM | POA: Diagnosis not present

## 2022-04-27 LAB — COMPREHENSIVE METABOLIC PANEL
ALT: 21 U/L (ref 0–44)
AST: 11 U/L — ABNORMAL LOW (ref 15–41)
Albumin: 3.3 g/dL — ABNORMAL LOW (ref 3.5–5.0)
Alkaline Phosphatase: 68 U/L (ref 38–126)
Anion gap: 10 (ref 5–15)
BUN: 7 mg/dL (ref 6–20)
CO2: 23 mmol/L (ref 22–32)
Calcium: 8.9 mg/dL (ref 8.9–10.3)
Chloride: 104 mmol/L (ref 98–111)
Creatinine, Ser: 0.79 mg/dL (ref 0.61–1.24)
GFR, Estimated: >60 mL/min (ref >60)
Glucose, Bld: 148 mg/dL — ABNORMAL HIGH (ref 70–99)
Potassium: 3.3 mmol/L — ABNORMAL LOW (ref 3.5–5.1)
Sodium: 137 mmol/L (ref 135–145)
Total Bilirubin: 0.8 mg/dL (ref 0.3–1.2)
Total Protein: 6.8 g/dL (ref 6.5–8.1)

## 2022-04-27 LAB — BETA-HYDROXYBUTYRIC ACID
Beta-Hydroxybutyric Acid: 1.5 mmol/L — ABNORMAL HIGH (ref 0.05–0.27)
Beta-Hydroxybutyric Acid: 3.96 mmol/L — ABNORMAL HIGH (ref 0.05–0.27)

## 2022-04-27 LAB — CBC WITH DIFFERENTIAL/PLATELET
Abs Immature Granulocytes: 0.02 10*3/uL (ref 0.00–0.07)
Basophils Absolute: 0 10*3/uL (ref 0.0–0.1)
Basophils Relative: 0 %
Eosinophils Absolute: 0.1 10*3/uL (ref 0.0–0.5)
Eosinophils Relative: 1 %
HCT: 36.6 % — ABNORMAL LOW (ref 39.0–52.0)
Hemoglobin: 12.4 g/dL — ABNORMAL LOW (ref 13.0–17.0)
Immature Granulocytes: 0 %
Lymphocytes Relative: 11 %
Lymphs Abs: 0.7 10*3/uL (ref 0.7–4.0)
MCH: 32.5 pg (ref 26.0–34.0)
MCHC: 33.9 g/dL (ref 30.0–36.0)
MCV: 95.8 fL (ref 80.0–100.0)
Monocytes Absolute: 0.8 10*3/uL (ref 0.1–1.0)
Monocytes Relative: 12 %
Neutro Abs: 4.6 10*3/uL (ref 1.7–7.7)
Neutrophils Relative %: 76 %
Platelets: 179 10*3/uL (ref 150–400)
RBC: 3.82 MIL/uL — ABNORMAL LOW (ref 4.22–5.81)
RDW: 14.1 % (ref 11.5–15.5)
WBC: 6.2 10*3/uL (ref 4.0–10.5)
nRBC: 0 % (ref 0.0–0.2)

## 2022-04-27 LAB — MAGNESIUM: Magnesium: 1.7 mg/dL (ref 1.7–2.4)

## 2022-04-27 LAB — CBG MONITORING, ED
Glucose-Capillary: 248 mg/dL — ABNORMAL HIGH (ref 70–99)
Glucose-Capillary: 293 mg/dL — ABNORMAL HIGH (ref 70–99)
Glucose-Capillary: 180 mg/dL — ABNORMAL HIGH (ref 70–99)
Glucose-Capillary: 155 mg/dL — ABNORMAL HIGH (ref 70–99)
Glucose-Capillary: 125 mg/dL — ABNORMAL HIGH (ref 70–99)
Glucose-Capillary: 134 mg/dL — ABNORMAL HIGH (ref 70–99)
Glucose-Capillary: 153 mg/dL — ABNORMAL HIGH (ref 70–99)
Glucose-Capillary: 165 mg/dL — ABNORMAL HIGH (ref 70–99)
Glucose-Capillary: 202 mg/dL — ABNORMAL HIGH (ref 70–99)
Glucose-Capillary: 151 mg/dL — ABNORMAL HIGH (ref 70–99)

## 2022-04-27 LAB — GLUCOSE, CAPILLARY
Glucose-Capillary: 130 mg/dL — ABNORMAL HIGH (ref 70–99)
Glucose-Capillary: 174 mg/dL — ABNORMAL HIGH (ref 70–99)

## 2022-04-27 LAB — BASIC METABOLIC PANEL
Anion gap: 12 (ref 5–15)
BUN: 6 mg/dL (ref 6–20)
CO2: 22 mmol/L (ref 22–32)
Calcium: 8.6 mg/dL — ABNORMAL LOW (ref 8.9–10.3)
Chloride: 100 mmol/L (ref 98–111)
Creatinine, Ser: 0.82 mg/dL (ref 0.61–1.24)
GFR, Estimated: >60 mL/min (ref >60)
Glucose, Bld: 290 mg/dL — ABNORMAL HIGH (ref 70–99)
Potassium: 3.8 mmol/L (ref 3.5–5.1)
Sodium: 134 mmol/L — ABNORMAL LOW (ref 135–145)

## 2022-04-27 LAB — TRIGLYCERIDES: Triglycerides: 90 mg/dL (ref <150)

## 2022-04-27 LAB — LACTIC ACID, PLASMA: Lactic Acid, Venous: 1 mmol/L (ref 0.5–1.9)

## 2022-04-27 MED ORDER — HYDROMORPHONE HCL 1 MG/ML IJ SOLN
1.0000 mg | INTRAMUSCULAR | Status: DC | PRN
  Administered 2022-04-27: 1 mg via INTRAVENOUS
  Filled 2022-04-27: qty 1

## 2022-04-27 MED ORDER — INSULIN GLARGINE-YFGN 100 UNIT/ML ~~LOC~~ SOLN
12.0000 [IU] | SUBCUTANEOUS | Status: DC
  Administered 2022-04-27 – 2022-04-28 (×2): 12 [IU] via SUBCUTANEOUS
  Filled 2022-04-27 (×3): qty 0.12

## 2022-04-27 MED ORDER — ONDANSETRON HCL 4 MG PO TABS: 4.0000 mg | ORAL_TABLET | Freq: Four times a day (QID) | ORAL | Status: DC | PRN

## 2022-04-27 MED ORDER — PANTOPRAZOLE SODIUM 40 MG PO TBEC
40.0000 mg | DELAYED_RELEASE_TABLET | Freq: Two times a day (BID) | ORAL | Status: DC
  Administered 2022-04-27 – 2022-04-30 (×6): 40 mg via ORAL
  Filled 2022-04-27 (×6): qty 1

## 2022-04-27 MED ORDER — SODIUM CHLORIDE 0.9% FLUSH
3.0000 mL | Freq: Two times a day (BID) | INTRAVENOUS | Status: DC
  Administered 2022-04-27 – 2022-04-30 (×5): 3 mL via INTRAVENOUS

## 2022-04-27 MED ORDER — PANCRELIPASE (LIP-PROT-AMYL) 12000-38000 UNITS PO CPEP
36000.0000 [IU] | ORAL_CAPSULE | Freq: Three times a day (TID) | ORAL | Status: DC
  Administered 2022-04-27: 36000 [IU] via ORAL
  Administered 2022-04-28 – 2022-04-29 (×5): 24000 [IU] via ORAL
  Administered 2022-04-30 (×2): 36000 [IU] via ORAL
  Filled 2022-04-27 (×3): qty 3
  Filled 2022-04-27: qty 1
  Filled 2022-04-27: qty 3
  Filled 2022-04-27: qty 1
  Filled 2022-04-27 (×3): qty 3

## 2022-04-27 MED ORDER — APIXABAN 5 MG PO TABS
5.0000 mg | ORAL_TABLET | Freq: Two times a day (BID) | ORAL | Status: DC
  Administered 2022-04-27 – 2022-04-30 (×8): 5 mg via ORAL
  Filled 2022-04-27 (×8): qty 1

## 2022-04-27 MED ORDER — ACETAMINOPHEN 325 MG PO TABS: 650.0000 mg | ORAL_TABLET | Freq: Four times a day (QID) | ORAL | Status: DC | PRN

## 2022-04-27 MED ORDER — INSULIN ASPART 100 UNIT/ML IJ SOLN
0.0000 [IU] | INTRAMUSCULAR | Status: DC
  Administered 2022-04-27 (×2): 2 [IU] via SUBCUTANEOUS
  Administered 2022-04-27: 1 [IU] via SUBCUTANEOUS
  Administered 2022-04-28 (×3): 2 [IU] via SUBCUTANEOUS
  Administered 2022-04-28 (×2): 3 [IU] via SUBCUTANEOUS
  Filled 2022-04-27: qty 0.09

## 2022-04-27 MED ORDER — POLYETHYLENE GLYCOL 3350 17 G PO PACK: 17.0000 g | PACK | Freq: Every day | ORAL | Status: DC | PRN

## 2022-04-27 MED ORDER — LACTATED RINGERS IV SOLN: INTRAVENOUS | Status: DC

## 2022-04-27 MED ORDER — HYDROMORPHONE HCL 1 MG/ML IJ SOLN
1.0000 mg | INTRAMUSCULAR | Status: DC | PRN
  Administered 2022-04-27 (×2): 1 mg via INTRAVENOUS
  Filled 2022-04-27 (×2): qty 1

## 2022-04-27 MED ORDER — ACETAMINOPHEN 650 MG RE SUPP: 650.0000 mg | Freq: Four times a day (QID) | RECTAL | Status: DC | PRN

## 2022-04-27 MED ORDER — INSULIN REGULAR(HUMAN) IN NACL 100-0.9 UT/100ML-% IV SOLN
INTRAVENOUS | Status: DC
  Administered 2022-04-27: 7.5 [IU]/h via INTRAVENOUS
  Filled 2022-04-27: qty 100

## 2022-04-27 MED ORDER — PANTOPRAZOLE SODIUM 40 MG PO TBEC
40.0000 mg | DELAYED_RELEASE_TABLET | Freq: Every day | ORAL | Status: DC
  Administered 2022-04-27: 40 mg via ORAL
  Filled 2022-04-27: qty 1

## 2022-04-27 MED ORDER — DEXTROSE 50 % IV SOLN: 0.0000 mL | INTRAVENOUS | Status: DC | PRN

## 2022-04-27 MED ORDER — DEXTROSE IN LACTATED RINGERS 5 % IV SOLN: INTRAVENOUS | Status: DC

## 2022-04-27 MED ORDER — ORAL CARE MOUTH RINSE: 15.0000 mL | OROMUCOSAL | Status: DC | PRN

## 2022-04-27 MED ORDER — HYDROMORPHONE HCL 2 MG/ML IJ SOLN
2.0000 mg | INTRAMUSCULAR | Status: DC | PRN
  Administered 2022-04-27 – 2022-04-30 (×13): 2 mg via INTRAVENOUS
  Filled 2022-04-27 (×14): qty 1

## 2022-04-27 MED ORDER — LACTATED RINGERS IV BOLUS
20.0000 mL/kg | Freq: Once | INTRAVENOUS | Status: AC
  Administered 2022-04-27: 1452 mL via INTRAVENOUS

## 2022-04-27 MED ORDER — HYDROMORPHONE HCL 2 MG/ML IJ SOLN: 1.5000 mg | INTRAMUSCULAR | Status: DC | PRN

## 2022-04-27 MED ORDER — ONDANSETRON HCL 4 MG/2ML IJ SOLN
4.0000 mg | Freq: Four times a day (QID) | INTRAMUSCULAR | Status: DC | PRN
  Administered 2022-04-29: 4 mg via INTRAVENOUS
  Filled 2022-04-27: qty 2

## 2022-04-27 NOTE — Progress Notes (Signed)
[facility] PLAN OF CARE NOTE Patient: Carlos [name] VOU:[id number]   PCP: [name], MD DOB: 1986/10/13   DOA: 04/26/2022   DOS: 04/27/2022    Patient was admitted by my colleague earlier on 04/27/2022. I have reviewed the H&P as well as assessment and plan and agree with the same. Important changes in the plan are listed below.  Plan of care: Principal Problem:   Acute on chronic pancreatitis (HCC) Active Problems:   Diabetic ketoacidosis without coma associated with type 1 diabetes mellitus (HCC)   Portal vein thrombosis   Gastroesophageal reflux disease with esophagitis without hemorrhage   History of alcohol abuse I have discussed and consulted [facility].  Author: Lynden Oxford, MD [facility] 04/27/2022 7:25 PM   If 7PM-7AM, please contact night-coverage at [email or web address]

## 2022-04-27 NOTE — Consult Note (Addendum)
Consultation  Referring Provider:   Dr. Posey Pronto Primary Care Physician:  [name], MD Primary Gastroenterologist:  Dr. Rush Landmark       Reason for Consultation:     Abdominal pain, acute on chronic pancreatitis   Impression    Acute on chronic pancreatitis with history of pseudocyst, splenic vein thrombosis, new onset diabetes  04/26/2022 LIPASE 89 04/27/2022 WBC 6.2 HGB 12.4  04/27/2022 BUN 7 Cr 0.79  04/27/2022 AST 11 ALT 21 Alkphos 68 TBili 0.8 04/26/2022 CT abdomen pelvis with contrast shows acute on chronic pancreatitis with moderate peripancreatic fat stranding, does show decreased pancreatic collections of tail and proximal collection and no new peripancreatic collections. Likely patient has current episode of acute pancreatitis either secondary to trigger point injection versus increased elevated sugars versus going for further work up with [facility].   Can check triglycerides with acute blood sugars.  Last triglycerides normal 12/08/2021 Kidney function normal. 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 Pylori 11/2021 endoscopy with Dr. Rush Landmark showed grade D esophagitis CT in the ER showed mild distal esophageal wall thickening reactive in the setting of pancreatitis versus reflux. Patient is on Prilosec 40 mg once daily Avoid NSAIDs   History of alcohol  abuse No ETOH in 3 years per patient  Hypokalemia Potassium 3.3  Magnesium 1.7  replete   Plan   Daily CMET, CBC -With history of grade D esophagitis and possible thickening on CT scan we will increase PPI to twice daily.  We will obtain triglycerides in setting of acute on chronic pancreatitis with elevated sugar Glucose monitoring with primary team -Bolus 1-2 liters LR if hemoconcentration -then continue fluid replacement with lactated ringers at 150cc/hr for 24 hours, then can decrease to 125 cc/h -Pain control per the inpatient medical team. -Encourage early ambulation -Advance diet as tolerated -Smoking and alcohol cessation counseling.  Thank you for your kind consultation, we will continue to follow.    Attending Physician Note   I have taken a history, reviewed the chart and examined the patient. I performed a substantive portion of this encounter, including complete performance of at least one of the key components, in conjunction with the APP. I agree with the APP's note, impression and recommendations with my edits. My additional impressions and recommendations are as follows.   *Acute on chronic pancreatitis. History of pseudocysts, SVT, new onset DM. CT AP shows moderate peripancreatic inflammation, decreased size of pancreatic tail collection, the proximal fluid collection has resolved, PVT, cavernous transformation of the PV, no SVT and mild hepatic steatosis.    *History of Grade D esophagitis. CT AP shows distal esophageal wall thickening.  *Hyperglycemia *Hyponatremia  *IV LR 150 cc/hr for now and LR bolus for signs of intravascular volume depletion  *Pain mgmt per primary service *Sips of clears, advance gradually to clears then fulls as pain improves *Pantoprazole 40 mg bid for  esophagitis  Lucio Edward, MD Marval Regal See Shea Evans, [facility], for our on call provider            HPI:   Carlos [name] is a 36 y.o. male with past medical history significant for  pancreatitis likely alcohol induced with complications of pseudocyst, splenic vein thrombosis with collateralization noted, new onset diabetes and anxiety presents for evaluation of epigastric abdominal pain. Patient is well-known to Dr. Rush Landmark status post EGD/EUS for pancreatic pseudocyst on 12/02/2021.  Patient was found to have grade D erosive esophagitis pathology showed Candida, 1 cm hiatal hernia, gastritis negative H. pylori any, no dysplasia..  EUS showed chronic pancreatitis with parenchymal loss concerning for significant necrosis causing cystgastrostomy/enterostomy to be unsafe to perform.   Last saw Dr. Rush Landmark 03/05/2022 in the office.  Patient was set up for pain clinic evaluation. Also appears may be had [facility] advanced endoscopy referral for consideration of pancreatic ERCP for concern of functional disorder of the sphincter as well as possibility of celiac plexus block.  Patient does have an appointment set up at [facility] July 31 for possible advanced endoscopic options. Patient had negative SPI NK I, PR SS 1, IgG4, cystic fibrosis gene  Patient is following [facility] for pain management. States on Dilaudid 4 mg every 6 hours and pain stays around 6. Went 04/21/2022 on a Wednesday for right rectus abdominis trigger point injection ultrasound-guided at [facility] with 10cc's of a solution containing 0.25% bupivacaine and 66m Kenalog.  States after this episode patient had increased abdominal pain that evening. Patient also states sugars were normally controlled the highest 200s but after the injection sugars went to 4-500s. Patient did come to the ER 04/23/2022 for abdominal pain, went home however due to continuing worsening abdominal pain presented back to the ER yesterday. Patient has constant upper epigastric pain radiating around to right shoulder blade, has had nausea with only 1 episode of vomiting night before last.  Denies fever or chills.  Has had night sweats but is contributing  this to his worsening sugars.  Last bowel movement was yesterday with mild straining which he attributes to the Dilaudid was regular denies any melena or hematochezia.  Denies reflux or dysphagia. Patient denies any yellowing skin or eyes, pruritus or abnormal urine. Sitting in bed tolerating liquids okay states he is hungry.  Current fluids are set at 125 cc.  January 2023 EUS EGD Impression: - White nummular lesions in esophageal mucosa. Biopsied. - LA Grade D erosive esophagitis with no bleeding distally. - Z-line irregular, 45 cm from the incisors. - 1 cm hiatal hernia. - Angulation deformities in the entire stomach. - Erythematous mucosa in the stomach. Biopsied. - Duodenal extrinsic deformity (seems to be the gallbladder based on the EUS more than anything else). - Normal mucosa was found in the duodenal bulb, in the first portion of the duodenum and in the second portion of the duodenum. - Normal major papilla. EUS Impression: - A perisplenic cystic lesion was noted. - Pancreatic parenchymal abnormalities consisting of diffuse echogenicity, lobularity with honeycombing, hyperechoic foci without shadowing and hyperechoic strands were noted in the entire pancreas. This patient has chronic pancreatitis. The parenchyma is concerning for significant necrosis, but not clear that I have an ability to safely perform a cystgastrostomy or enterostomy. - As noted above, the patient now has had significant changes in the way the previous CT looks compared to now, so we did not perform any other interventions. He will need more imaging. - There was  no sign of significant pathology in the common bile duct and in the common hepatic duct. - Hyperechoic material consistent with sludge was visualized endosonographically in the gallbladder. This was in a region consistent with the gallbladder though small chance this could have been a peripancreatic fluid collection in that area of the RUQ as well. - A  few enlarged lymph nodes were visualized in the celiac region (level 20). Tissue has not been obtained. However, the endosonographic appearance is consistent with benign inflammatory changes. - Endosonographically, there was normal vascular flow in the splenic vein. - Endosonographic images of the left kidney were unremarkable.  December 2022 CT abdomen pelvis with contrast IMPRESSION: 1. Signs of chronic pancreatitis with interval resolution of previous peripancreatic edema and free fluid. 2. 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 These have increased in size from previous exam. 3. Pseudocysts exhibit mass effect upon the splenic vein, which is significantly narrowed resulting in interval development of upper abdominal collaterals. 4. Small amount of free fluid noted within the dependent portion of the pelvis.  March 2023 CT abdomen pelvis with contrast IMPRESSION: 1. Findings most consistent with acute interstitial edematous pancreatitis superimposed on chronic pancreatitis. Peripancreatic fluid collections are most consistent with pseudocysts. 2. Nonocclusive thrombus in the portal vein is likely secondary to pancreatitis.  April 23 2022 CT AB pelvis with contrast IMPRESSION: 1. Acute on chronic pancreatitis. Moderate peripancreatic fat stranding with trace free fluid tracking into the bilateral pericolic gutters. Decreased size of pancreatic collection in the tail. The more proximal collection has resolved from prior exam. No new peripancreatic collection. 2. 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 3. Heterogeneous enhancement of the superior mesenteric vein, but no definite intraluminal thrombus. 4. Mild distal esophageal wall thickening, reactive in the setting of pancreatitis versus reflux. 5. Mild hepatic steatosis.   Past Medical History:  Diagnosis  Date   AKI (acute kidney injury) ([place]) 12/21/2021   Anxiety    Diabetes ([place])    Pancreatitis    Splenic vein thrombosis     Surgical History:  He  has a past surgical history that includes No past surgeries; Upper esophageal endoscopic ultrasound (eus) (N/A, 12/02/2021); Esophagogastroduodenoscopy (egd) with propofol (N/A, 12/02/2021); and biopsy (12/02/2021). Family History:  His family history includes Alcoholism in his maternal uncle; Diabetes in his maternal grandfather, maternal grandmother, paternal grandfather, and paternal grandmother; Hypertension in his mother. Social History:   reports that he has been smoking cigars. He started smoking about 19 years ago. He has never used smokeless tobacco. He reports that he does not currently use alcohol. He reports that he does not use drugs.  Prior to Admission medications   Medication Sig Start Date End Date Taking? Authorizing Provider  apixaban (ELIQUIS) 5 MG TABS tablet Take 1 tablet (5 mg total) by mouth 2 (two) times daily. 03/23/22  Yes [name], MD  HYDROmorphone (DILAUDID) 4 MG tablet Take 1 tablet (4 mg total) by mouth every 6 (six) hours as needed for severe pain. 03/31/22 04/30/22 Yes [name], MD  insulin aspart (NOVOLOG) 100 UNIT/ML FlexPen Max daily 30 units 03/10/22  Yes [name], Melanie Crazier, MD  insulin glargine (LANTUS) 100 UNIT/ML Solostar Pen Inject 16 Units into the skin daily. 03/10/22  Yes [name], Melanie Crazier, MD  lipase/protease/amylase (CREON) 36000 UNITS CPEP capsule Take 2 capsules (72,000 Units total) by mouth 3 (three) times daily with meals. May also  take 1 capsule (36,000 Units total) as needed (with snacks). 01/01/22  Yes [name], Telford Nab., MD  Multiple Vitamins-Minerals (ADVANCED DIABETIC MULTIVITAMIN PO) Take 1 tablet by mouth daily.   Yes [provider]  omeprazole (PRILOSEC) 40 MG capsule Take 1 capsule (40 mg total) by mouth daily. 02/03/22  Yes [name], MD   ondansetron (ZOFRAN) 8 MG tablet Take 1 tablet (8 mg total) by mouth every 8 (eight) hours as needed for nausea or vomiting. 04/23/22  Yes Ezequiel Essex, MD  blood glucose meter kit and supplies KIT Dispense based on patient and insurance preference. Use up to four times daily as directed. 09/16/21   Alma Friendly, MD  Continuous Blood Gluc Sensor (DEXCOM G6 SENSOR) MISC 1 Device by Does not apply route as directed. 03/10/22   [name], Melanie Crazier, MD  Continuous Blood Gluc Sensor (FREESTYLE LIBRE 2 SENSOR) MISC Change every 14 days 03/22/22   [name], Melanie Crazier, MD  Continuous Blood Gluc Transmit (DEXCOM G6 TRANSMITTER) MISC 1 Device by Does not apply route as directed. 03/10/22   [name], Melanie Crazier, MD  Insulin Disposable Pump (OMNIPOD 5 G6 INTRO, GEN 5,) KIT 1 Device by Does not apply route every 3 (three) days. 03/10/22   [name], Melanie Crazier, MD  Insulin Disposable Pump (OMNIPOD 5 G6 POD, GEN 5,) MISC 1 Device by Does not apply route every 3 (three) days. 03/10/22   [name], Melanie Crazier, MD  Insulin Pen Needle 32G X 4 MM MISC 1 Device by Does not apply route in the morning, at noon, in the evening, and at bedtime. 03/10/22   [name], Melanie Crazier, MD  ondansetron (ZOFRAN) 8 MG tablet Take 1 tablet (8 mg total) by mouth every 8 (eight) hours as needed for nausea or vomiting. Patient not taking: Reported on 04/23/2022 12/02/21   [name], Telford Nab., MD  oxyCODONE (ROXICODONE) 5 MG immediate release tablet Take 1 tablet (5 mg total) by mouth every 6 (six) hours as needed for severe pain. Patient not taking: Reported on 04/23/2022 03/05/22   [name], Telford Nab., MD    Current Facility-Administered Medications  Medication Dose Route Frequency Provider Last Rate Last Admin   acetaminophen (TYLENOL) tablet 650 mg  650 mg Oral Q6H PRN Shalhoub, Sherryll Burger, MD       Or   acetaminophen (TYLENOL) suppository 650 mg  650 mg Rectal Q6H PRN Shalhoub, Sherryll Burger, MD        apixaban Arne Cleveland) tablet 5 mg  5 mg Oral BID Vernelle Emerald, MD   5 mg at 04/27/22 0818   dextrose 5 % in lactated ringers infusion   Intravenous Continuous Shalhoub, Sherryll Burger, MD 125 mL/hr at 04/27/22 0410 New Bag at 04/27/22 0410   dextrose 50 % solution 0-50 mL  0-50 mL Intravenous PRN Shalhoub, Sherryll Burger, MD       HYDROmorphone (DILAUDID) injection 1 mg  1 mg Intravenous Q3H PRN Vernelle Emerald, MD   1 mg at 04/27/22 0534   Or   HYDROmorphone (DILAUDID) injection 2 mg  2 mg Intravenous Q3H PRN Shalhoub, Sherryll Burger, MD       insulin regular, human (MYXREDLIN) 100 units/ 100 mL infusion   Intravenous Continuous Shalhoub, Sherryll Burger, MD 0.9 mL/hr at 04/27/22 0641 0.9 Units/hr at 04/27/22 0641   lactated ringers infusion   Intravenous Continuous Shalhoub, Sherryll Burger, MD       ondansetron [facility]) tablet 4 mg  4 mg Oral Q6H PRN Vernelle Emerald,  MD       Or   ondansetron (ZOFRAN) injection 4 mg  4 mg Intravenous Q6H PRN Shalhoub, Sherryll Burger, MD       pantoprazole (PROTONIX) EC tablet 40 mg  40 mg Oral Daily Shalhoub, Sherryll Burger, MD   40 mg at 04/27/22 0818   polyethylene glycol (MIRALAX / GLYCOLAX) packet 17 g  17 g Oral Daily PRN Shalhoub, Sherryll Burger, MD       sodium chloride flush (NS) 0.9 % injection 3 mL  3 mL Intravenous Q12H Shalhoub, Sherryll Burger, MD   3 mL at 04/27/22 2774   Current Outpatient Medications  Medication Sig Dispense Refill   apixaban (ELIQUIS) 5 MG TABS tablet Take 1 tablet (5 mg total) by mouth 2 (two) times daily. 60 tablet 1   HYDROmorphone (DILAUDID) 4 MG tablet Take 1 tablet (4 mg total) by mouth every 6 (six) hours as needed for severe pain. 120 tablet 0   insulin aspart (NOVOLOG) 100 UNIT/ML FlexPen Max daily 30 units 30 mL 1   insulin glargine (LANTUS) 100 UNIT/ML Solostar Pen Inject 16 Units into the skin daily. 15 mL 3   lipase/protease/amylase (CREON) 36000 UNITS CPEP capsule Take 2 capsules (72,000 Units total) by mouth 3 (three) times daily with meals. May also  take 1 capsule (36,000 Units total) as needed (with snacks). 240 capsule 1   Multiple Vitamins-Minerals (ADVANCED DIABETIC MULTIVITAMIN PO) Take 1 tablet by mouth daily.     omeprazole (PRILOSEC) 40 MG capsule Take 1 capsule (40 mg total) by mouth daily. 90 capsule 1   ondansetron (ZOFRAN) 8 MG tablet Take 1 tablet (8 mg total) by mouth every 8 (eight) hours as needed for nausea or vomiting. 30 tablet 0   blood glucose meter kit and supplies KIT Dispense based on patient and insurance preference. Use up to four times daily as directed. 1 each 0   Continuous Blood Gluc Sensor (DEXCOM G6 SENSOR) MISC 1 Device by Does not apply route as directed. 9 each 3   Continuous Blood Gluc Sensor (FREESTYLE LIBRE 2 SENSOR) MISC Change every 14 days 3 each 0   Continuous Blood Gluc Transmit (DEXCOM G6 TRANSMITTER) MISC 1 Device by Does not apply route as directed. 1 each 3   Insulin Disposable Pump (OMNIPOD 5 G6 INTRO, GEN 5,) KIT 1 Device by Does not apply route every 3 (three) days. 1 kit 0   Insulin Disposable Pump (OMNIPOD 5 G6 POD, GEN 5,) MISC 1 Device by Does not apply route every 3 (three) days. 6 each 3   Insulin Pen Needle 32G X 4 MM MISC 1 Device by Does not apply route in the morning, at noon, in the evening, and at bedtime. 400 each 3   ondansetron (ZOFRAN) 8 MG tablet Take 1 tablet (8 mg total) by mouth every 8 (eight) hours as needed for nausea or vomiting. (Patient not taking: Reported on 04/23/2022) 30 tablet 1   oxyCODONE (ROXICODONE) 5 MG immediate release tablet Take 1 tablet (5 mg total) by mouth every 6 (six) hours as needed for severe pain. (Patient not taking: Reported on 04/23/2022) 15 tablet 0    Allergies as of 04/26/2022 - Review Complete 04/26/2022  Allergen Reaction Noted   Other Anaphylaxis 09/26/2020    Review of Systems:    Constitutional: No weight loss, fever, chills, weakness or fatigue HEENT: Eyes: No change in vision               Ears, Nose,  Throat:  No change in hearing  or congestion Skin: No rash or itching Cardiovascular: No chest pain, chest pressure or palpitations   Respiratory: No SOB or cough Gastrointestinal: See HPI and otherwise negative Genitourinary: No dysuria or change in urinary frequency Neurological: No headache, dizziness or syncope Musculoskeletal: No new muscle or joint pain Hematologic: No bleeding or bruising Psychiatric: No history of depression or anxiety     Physical Exam:  Vital signs in last 24 hours: Temp:  [98.1 F (36.7 C)] 98.1 F (36.7 C) (06/19 1636) Pulse Rate:  [66-94] 69 (06/20 0800) Resp:  [11-23] 14 (06/20 0800) BP: (109-154)/(64-103) 142/101 (06/20 0800) SpO2:  [96 %-100 %] 99 % (06/20 0800) Weight:  [72.6 kg] 72.6 kg (06/19 1638)   Last BM recorded by nurses in past 5 days No data recorded  General:   Pleasant, well developed male in no acute distress Head:  Normocephalic and atraumatic. Eyes: sclerae anicteric,conjunctive pink  Heart:  regular rate and rhythm Pulm: Clear anteriorly; no wheezing Abdomen:  Soft, Flat AB, Active bowel sounds. mild tenderness in the epigastrium. With guarding and Without rebound, No organomegaly appreciated. Extremities:  Without edema. Msk:  Symmetrical without gross deformities. Peripheral pulses intact.  Neurologic:  Alert and  oriented x4;  No focal deficits.  Skin:   Dry and intact without significant lesions or rashes. Psychiatric:  Cooperative. Normal mood and affect.  LAB RESULTS: Recent Labs    04/26/22 1742 04/27/22 0534  WBC 7.3 6.2  HGB 13.8 12.4*  HCT 41.6 36.6*  PLT 209 179   BMET Recent Labs    04/26/22 1742 04/27/22 0013 04/27/22 0534  NA 131* 134* 137  K 4.5 3.8 3.3*  CL 96* 100 104  CO2 20* 22 23  GLUCOSE 504* 290* 148*  BUN _0 CREATININE 1.03 0.82 0.79  CALCIUM 9.2 8.6* 8.9   LFT Recent Labs    04/27/22 0534  PROT 6.8  ALBUMIN 3.3*  AST 11*  ALT 21  ALKPHOS 68  BILITOT 0.8   PT/INR No results for input(s): "LABPROT",  "INR" in the last 72 hours.  STUDIES: No results found.   Vladimir Crofts  04/27/2022, 8:28 AM

## 2022-04-27 NOTE — Assessment & Plan Note (Addendum)
   Patient states that he has remained abstinent for the past 3 years.

## 2022-04-27 NOTE — Assessment & Plan Note (Signed)
Continuing home regimen of daily PPI therapy.  

## 2022-04-27 NOTE — Assessment & Plan Note (Addendum)
   We will continue outpatient regimen of Eliquis  Thrombosis seems unchanged on 6/16 scan compared to prior

## 2022-04-27 NOTE — Assessment & Plan Note (Signed)
   Patient presenting with 6 days of worsening epigastric pain consistent with acute on chronic pancreatitis  CT evidence of acute on chronic pancreatitis per 6/16 CT imaging of the abdomen and pelvis  Keeping patient n.p.o. with exception of ice chips and sips of clears  Hydrating patient with intravenous isotonic fluid  Intravenous opiate-based analgesics for substantial associated pain  If patient fails to clinically improve after 24 to 48 hours we will consult gastroenterology

## 2022-04-27 NOTE — H&P (Signed)
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  Patient explains that for the past 6 days he has been experiencing increased epigastric pain compared to his baseline.  Pain is severe in intensity, sharp in quality, located in the epigastric region and radiates around the abdomen.  Pain seems to have been exacerbated by a right rectus abdominis trigger point injection performed last Wednesday at [facility].  Patient's pain continued to worsen and eventually presented to [facility] emergency department on 6/16.  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  At that time, patient received several doses of opiate-based analgesics and fluids and was discharged home with outpatient follow-up.  Since that presentation patient's pain has continued to worsen.  Patient is continued to experience extremely poor oral intake due to any attempt at oral intake exacerbating his pain.  Patient then again presented to [facility] emergency department for repeat evaluation.  Upon evaluation in the emergency department patient was clinically found to be volume depleted as well as extremely hyperglycemic with an anion gap.  2 L of lactated Ringer solution were administered as well as intravenous Dilaudid for pain.  Hospitalist group was contacted for evaluation of patient's acute on chronic  pancreatitis.  Review of Systems: Review of Systems  Gastrointestinal:  Positive for abdominal pain and nausea.  Neurological:  Positive for weakness.  All other systems reviewed and are negative.    Past Medical History:  Diagnosis Date   AKI (acute kidney injury) ([place]) 12/21/2021   Anxiety    Diabetes ([place])    Pancreatitis    Splenic vein thrombosis     Past Surgical History:  Procedure Laterality Date   BIOPSY  12/02/2021   Procedure: BIOPSY;  Surgeon: Irving Copas., MD;  Location: [facility];  Service: Gastroenterology;;   ESOPHAGOGASTRODUODENOSCOPY (EGD) WITH PROPOFOL N/A 12/02/2021   Procedure: ESOPHAGOGASTRODUODENOSCOPY (EGD) WITH PROPOFOL;  Surgeon: Irving Copas., MD;  Location: [facility];  Service: Gastroenterology;  Laterality: N/A;   NO PAST SURGERIES     UPPER ESOPHAGEAL ENDOSCOPIC ULTRASOUND (EUS) N/A 12/02/2021   Procedure: UPPER ESOPHAGEAL ENDOSCOPIC ULTRASOUND (EUS);  Surgeon: Irving Copas., MD;  Location: [facility];  Service: Gastroenterology;  Laterality: N/A;    Social History:  reports that he has been smoking cigars. He started smoking about 19 years ago. He has never used smokeless tobacco. He reports that he does not currently use alcohol. He reports that he does not use drugs.  Allergies  Allergen Reactions   Other Anaphylaxis    [name] syrup    Family History  Problem Relation Age of Onset   Hypertension Mother    Alcoholism Maternal Uncle    Diabetes Maternal Grandmother    Diabetes Maternal Grandfather    Diabetes Paternal Grandmother    Diabetes Paternal Grandfather    Colon cancer Neg Hx    Esophageal cancer Neg Hx  Inflammatory bowel disease Neg Hx    Liver disease Neg Hx    Pancreatic cancer Neg Hx    Rectal cancer Neg Hx    Stomach cancer Neg Hx     Prior to Admission medications   Medication Sig Start Date End Date Taking? Authorizing Provider  apixaban (ELIQUIS) 5 MG TABS tablet Take 1  tablet (5 mg total) by mouth 2 (two) times daily. 03/23/22  Yes [name], MD  HYDROmorphone (DILAUDID) 4 MG tablet Take 1 tablet (4 mg total) by mouth every 6 (six) hours as needed for severe pain. 03/31/22 04/30/22 Yes [name], MD  insulin aspart (NOVOLOG) 100 UNIT/ML FlexPen Max daily 30 units 03/10/22  Yes [name], Melanie Crazier, MD  insulin glargine (LANTUS) 100 UNIT/ML Solostar Pen Inject 16 Units into the skin daily. 03/10/22  Yes [name], Melanie Crazier, MD  lipase/protease/amylase (CREON) 36000 UNITS CPEP capsule Take 2 capsules (72,000 Units total) by mouth 3 (three) times daily with meals. May also take 1 capsule (36,000 Units total) as needed (with snacks). 01/01/22  Yes [name], Telford Nab., MD  Multiple Vitamins-Minerals (ADVANCED DIABETIC MULTIVITAMIN PO) Take 1 tablet by mouth daily.   Yes [provider]  omeprazole (PRILOSEC) 40 MG capsule Take 1 capsule (40 mg total) by mouth daily. 02/03/22  Yes [name], MD  ondansetron (ZOFRAN) 8 MG tablet Take 1 tablet (8 mg total) by mouth every 8 (eight) hours as needed for nausea or vomiting. 04/23/22  Yes Ezequiel Essex, MD  blood glucose meter kit and supplies KIT Dispense based on patient and insurance preference. Use up to four times daily as directed. 09/16/21   Alma Friendly, MD  Continuous Blood Gluc Sensor (DEXCOM G6 SENSOR) MISC 1 Device by Does not apply route as directed. 03/10/22   [name], Melanie Crazier, MD  Continuous Blood Gluc Sensor (FREESTYLE LIBRE 2 SENSOR) MISC Change every 14 days 03/22/22   [name], Melanie Crazier, MD  Continuous Blood Gluc Transmit (DEXCOM G6 TRANSMITTER) MISC 1 Device by Does not apply route as directed. 03/10/22   [name], Melanie Crazier, MD  Insulin Disposable Pump (OMNIPOD 5 G6 INTRO, GEN 5,) KIT 1 Device by Does not apply route every 3 (three) days. 03/10/22   [name], Melanie Crazier, MD  Insulin Disposable Pump (OMNIPOD 5 G6 POD, GEN 5,) MISC 1  Device by Does not apply route every 3 (three) days. 03/10/22   [name], Melanie Crazier, MD  Insulin Pen Needle 32G X 4 MM MISC 1 Device by Does not apply route in the morning, at noon, in the evening, and at bedtime. 03/10/22   [name], Melanie Crazier, MD  ondansetron (ZOFRAN) 8 MG tablet Take 1 tablet (8 mg total) by mouth every 8 (eight) hours as needed for nausea or vomiting. Patient not taking: Reported on 04/23/2022 12/02/21   [name], Telford Nab., MD  oxyCODONE (ROXICODONE) 5 MG immediate release tablet Take 1 tablet (5 mg total) by mouth every 6 (six) hours as needed for severe pain. Patient not taking: Reported on 04/23/2022 03/05/22   [name], Telford Nab., MD    Physical Exam:  Vitals:   04/26/22 2215 04/26/22 2230 04/26/22 2300 04/27/22 0200  BP: (!) 148/95 (!) 144/92 (!) 140/99 (!) 148/97  Pulse: 77 79 77 88  Resp: '11 14 11 19  ' Temp:      TempSrc:      SpO2: 100% 100% 100% 96%  Weight:      Height:  Constitutional: Awake alert and oriented x3, patient is in distress due to abdominal pain  skin: no rashes, no lesions, poor skin turgor noted. Eyes: Pupils are equally reactive to light.  No evidence of scleral icterus or conjunctival pallor.  ENMT: Dry mucous membranes noted.  Posterior pharynx clear of any exudate or lesions.   Neck: normal, supple, no masses, no thyromegaly.  No evidence of jugular venous distension.   Respiratory: clear to auscultation bilaterally, no wheezing, no crackles. Normal respiratory effort. No accessory muscle use.  Cardiovascular: Regular rate and rhythm, no murmurs / rubs / gallops. No extremity edema. 2+ pedal pulses. No carotid bruits.  Chest:   Nontender without crepitus or deformity.   Back:   Nontender without crepitus or deformity. Abdomen: Notable epigastric tenderness.  Abdomen is soft.  No evidence of intra-abdominal masses.  Positive bowel sounds noted in all quadrants.   Musculoskeletal: No joint deformity upper and lower  extremities. Good ROM, no contractures. Normal muscle tone.  Neurologic: CN 2-12 grossly intact. Sensation intact.  Patient moving all 4 extremities spontaneously.  Patient is following all commands.  Patient is responsive to verbal stimuli.   Psychiatric: Patient exhibits normal mood with appropriate affect.  Patient seems to possess insight as to their current situation.    Data Reviewed:  I have personally reviewed and interpreted labs, imaging.  Significant findings are:  Lipase 89 Chemistry revealing sodium 131, chloride 96, bicarbonate 20, glucose 504, anion gap of 15 Beta-hydroxybutyrate 3.96  EKG: Personally reviewed.  Rhythm is NSR with heart rate of 93BPM.  No dynamic ST segment changes appreciated.   Assessment and Plan: * Acute on chronic pancreatitis [facility]) Patient presenting with 6 days of worsening epigastric pain consistent with acute on chronic pancreatitis CT evidence of acute on chronic pancreatitis per 6/16 CT imaging of the abdomen and pelvis Keeping patient n.p.o. with exception of ice chips and sips of clears Hydrating patient with intravenous isotonic fluid Intravenous opiate-based analgesics for substantial associated pain If patient fails to clinically improve after 24 to 48 hours we will consult gastroenterology  Diabetic ketoacidosis without coma associated with type 1 diabetes mellitus ([place]) Patient exhibiting hyperglycemia, anion gap of 15 with elevated beta hydroxybutyrate all consistent with DKA Initiating insulin infusion Patient n.p.o. as noted above Hydrating patient with intravenous isotonic fluids Monitoring anion gap with serial chemistries and serial beta hydroxybutyrate levels Placing in stepdown bed  Portal vein thrombosis We will continue outpatient regimen of Eliquis Thrombosis seems unchanged on 6/16 scan compared to prior  Gastroesophageal reflux disease with esophagitis without hemorrhage Continuing home regimen of daily PPI  therapy.   History of alcohol abuse Patient states that he has remained abstinent for the past 3 years.       Code Status:  Full code  code status decision has been confirmed with: patient Family Communication: deferred   Consults: None, will consider gastroenterology consultation if no improvement in 24 to 48 hours.  Severity of Illness:  The appropriate patient status for this patient is INPATIENT. Inpatient status is judged to be reasonable and necessary in order to provide the required intensity of service to ensure the patient's safety. The patient's presenting symptoms, physical exam findings, and initial radiographic and laboratory data in the context of their chronic comorbidities is felt to place them at high risk for further clinical deterioration. Furthermore, it is not anticipated that the patient will be medically stable for discharge from the hospital within 2 midnights of admission.   *  I certify that at the point of admission it is my clinical judgment that the patient will require inpatient hospital care spanning beyond 2 midnights from the point of admission due to high intensity of service, high risk for further deterioration and high frequency of surveillance required.*  Author:  Vernelle Emerald MD  04/27/2022 2:53 AM

## 2022-04-27 NOTE — Plan of Care (Signed)
  Problem: Fluid Volume: Goal: Ability to maintain a balanced intake and output will improve Outcome: Progressing   Problem: Pain Managment: Goal: General experience of comfort will improve Outcome: Progressing

## 2022-04-27 NOTE — Inpatient Diabetes Management (Addendum)
Inpatient Diabetes Program Recommendations  AACE/ADA: New Consensus Statement on Inpatient Glycemic Control (2015)  Target Ranges:  Prepandial:   less than 140 mg/dL      Peak postprandial:   less than 180 mg/dL (1-2 hours)      Critically ill patients:  140 - 180 mg/dL    Latest Reference Range & Units 04/27/22 00:13  Beta-Hydroxybutyric Acid 0.05 - 0.27 mmol/L 3.96 (H)  (H): Data is abnormally high  Latest Reference Range & Units 03/10/22 14:37  Hemoglobin A1C 4.0 - 5.6 % 6.9 !  !: Data is abnormal  Latest Reference Range & Units 04/23/22 18:50 04/23/22 20:06 04/23/22 21:35 04/26/22 20:52 04/27/22 01:45 04/27/22 03:15 04/27/22 04:05 04/27/22 05:20 04/27/22 06:39  Glucose-Capillary 70 - 99 mg/dL 548 (HH) 451 (H) 341 (H) 438 (H) 248 (H) 293 (H) 180 (H) 155 (H) 125 (H)    Admit with: Acute on chronic pancreatitis/ DKA  History: Type 1 Diabetes, Chronic Pancreatitis  Home DM Meds: Novolog Max 30 units       Lantus 16 units Daily       Dexcom/ Freestyle CGM       Omni Pod Insulin Pump  Current Orders: IV Insulin Drip   Endocrinologist: Dr. Kelton Pillar with [facility] Last seen 03/10/2022 Instructions given at that appt: Continue Lantus 16 units daily Increase NovoLog 5 units TID before every meal Correction factor: NovoLog (BG -130/50) Set up appt to train for the Omni Pod Insulin Pump     MD- Note 5:34am BMET shows the following: Glucose 148 Anion Gap 10 CO2 level 23  When you allow pt to transition to SQ Insulin today, please consider the following (Based on last ENDO appt insulin instructions):  1. Start Semglee 16 units Daily (make sure to continue the IV Insulin drip for 2 hours after Semglee started)  2. Start Novolog Sensitive Correction Scale/ SSI (0-9 units) TID AC + HS Choose Q4 hour coverage if pt will be NPO for prolonged period   3. If allowed to start solid PO diet, Start Novolog 3 units TID with meals for meal coverage HOLD of pt eats <50%  meals    Addendum 11:35am--Met w/ pt down in the ED.  Pt stated he was feeling better.  Told me he received a steroid injection for pain into his abd last Wed (06/14) and was having high CBGs at home since the injection despite taking his normal insulin doses.  We reviewed how steroids affect CBGs.  Also discussed with patient diagnosis of DKA (pathophysiology), treatment of DKA, lab results, and transition plan to SQ insulin regimen.  Discussed with pt that we have begun the transition to SQ Insulin this AM--Explained that the hospital carries Semglee insulin and he will get that in place of his Lantus insulin--Also explained that we will start Novolog Correction/ SSI Q4 hours while he is unable to eat--Dicussed with pt that the MD can add food coverage to in-hospital insulin regimen once pt starts back on PO diet.  Pt told me he hasp appts in July with his ENDO to learn the Dexcom CGM and the Omni Pod insulin pump--Still using Lantus and Novolog at home for now.  Pt appreciative of visit and did not have any further questions for me.    --Will follow patient during hospitalization--  Wyn Quaker RN, MSN, CDE Diabetes Coordinator Inpatient Glycemic Control Team Team Pager: [phone] (8a-5p)

## 2022-04-27 NOTE — Assessment & Plan Note (Signed)
   Patient exhibiting hyperglycemia, anion gap of 15 with elevated beta hydroxybutyrate all consistent with DKA  Initiating insulin infusion  Patient n.p.o. as noted above  Hydrating patient with intravenous isotonic fluids  Monitoring anion gap with serial chemistries and serial beta hydroxybutyrate levels  Placing in stepdown bed

## 2022-04-28 ENCOUNTER — Other Ambulatory Visit: Payer: Self-pay | Admitting: Family Medicine

## 2022-04-28 ENCOUNTER — Telehealth: Payer: Self-pay

## 2022-04-28 ENCOUNTER — Inpatient Hospital Stay (HOSPITAL_COMMUNITY): Payer: Medicaid Other

## 2022-04-28 DIAGNOSIS — G8929 Other chronic pain: Secondary | ICD-10-CM

## 2022-04-28 DIAGNOSIS — K859 Acute pancreatitis without necrosis or infection, unspecified: Secondary | ICD-10-CM | POA: Diagnosis not present

## 2022-04-28 DIAGNOSIS — K860 Alcohol-induced chronic pancreatitis: Secondary | ICD-10-CM

## 2022-04-28 DIAGNOSIS — K861 Other chronic pancreatitis: Secondary | ICD-10-CM | POA: Diagnosis not present

## 2022-04-28 DIAGNOSIS — K2100 Gastro-esophageal reflux disease with esophagitis, without bleeding: Secondary | ICD-10-CM | POA: Diagnosis not present

## 2022-04-28 LAB — CBC
HCT: 41 % (ref 39.0–52.0)
Hemoglobin: 13.8 g/dL (ref 13.0–17.0)
MCH: 32.7 pg (ref 26.0–34.0)
MCHC: 33.7 g/dL (ref 30.0–36.0)
MCV: 97.2 fL (ref 80.0–100.0)
Platelets: 186 10*3/uL (ref 150–400)
RBC: 4.22 MIL/uL (ref 4.22–5.81)
RDW: 14 % (ref 11.5–15.5)
WBC: 5.9 10*3/uL (ref 4.0–10.5)
nRBC: 0 % (ref 0.0–0.2)

## 2022-04-28 LAB — BASIC METABOLIC PANEL
Anion gap: 11 (ref 5–15)
BUN: 6 mg/dL (ref 6–20)
CO2: 26 mmol/L (ref 22–32)
Calcium: 8.8 mg/dL — ABNORMAL LOW (ref 8.9–10.3)
Chloride: 99 mmol/L (ref 98–111)
Creatinine, Ser: 0.71 mg/dL (ref 0.61–1.24)
GFR, Estimated: >60 mL/min (ref >60)
Glucose, Bld: 162 mg/dL — ABNORMAL HIGH (ref 70–99)
Potassium: 3 mmol/L — ABNORMAL LOW (ref 3.5–5.1)
Sodium: 136 mmol/L (ref 135–145)

## 2022-04-28 LAB — GLUCOSE, CAPILLARY
Glucose-Capillary: 162 mg/dL — ABNORMAL HIGH (ref 70–99)
Glucose-Capillary: 178 mg/dL — ABNORMAL HIGH (ref 70–99)
Glucose-Capillary: 200 mg/dL — ABNORMAL HIGH (ref 70–99)
Glucose-Capillary: 204 mg/dL — ABNORMAL HIGH (ref 70–99)
Glucose-Capillary: 213 mg/dL — ABNORMAL HIGH (ref 70–99)
Glucose-Capillary: 233 mg/dL — ABNORMAL HIGH (ref 70–99)
Glucose-Capillary: 238 mg/dL — ABNORMAL HIGH (ref 70–99)

## 2022-04-28 LAB — MAGNESIUM: Magnesium: 1.8 mg/dL (ref 1.7–2.4)

## 2022-04-28 MED ORDER — HYDROMORPHONE HCL 2 MG PO TABS
4.0000 mg | ORAL_TABLET | ORAL | Status: DC | PRN
  Administered 2022-04-28 – 2022-04-30 (×7): 4 mg via ORAL
  Filled 2022-04-28 (×8): qty 2

## 2022-04-28 MED ORDER — INSULIN ASPART 100 UNIT/ML IJ SOLN
0.0000 [IU] | Freq: Every day | INTRAMUSCULAR | Status: DC
  Administered 2022-04-28 – 2022-04-29 (×2): 2 [IU] via SUBCUTANEOUS

## 2022-04-28 MED ORDER — HYDROMORPHONE HCL 4 MG PO TABS: 4.0000 mg | ORAL_TABLET | Freq: Four times a day (QID) | ORAL | 0 refills | Status: DC | PRN

## 2022-04-28 MED ORDER — POTASSIUM CHLORIDE CRYS ER 20 MEQ PO TBCR
40.0000 meq | EXTENDED_RELEASE_TABLET | ORAL | Status: AC
Start: 2022-04-28 — End: 2022-04-28
  Administered 2022-04-28 (×2): 40 meq via ORAL
  Filled 2022-04-28 (×2): qty 2

## 2022-04-28 MED ORDER — INSULIN ASPART 100 UNIT/ML IJ SOLN
0.0000 [IU] | Freq: Three times a day (TID) | INTRAMUSCULAR | Status: DC
  Administered 2022-04-29: 1 [IU] via SUBCUTANEOUS
  Administered 2022-04-29: 5 [IU] via SUBCUTANEOUS
  Administered 2022-04-29: 2 [IU] via SUBCUTANEOUS
  Administered 2022-04-30: 3 [IU] via SUBCUTANEOUS
  Administered 2022-04-30: 1 [IU] via SUBCUTANEOUS

## 2022-04-28 MED ORDER — GADOBUTROL 1 MMOL/ML IV SOLN
7.0000 mL | Freq: Once | INTRAVENOUS | Status: AC | PRN
  Administered 2022-04-28: 7 mL via INTRAVENOUS

## 2022-04-28 MED ORDER — INSULIN ASPART 100 UNIT/ML IJ SOLN
3.0000 [IU] | Freq: Three times a day (TID) | INTRAMUSCULAR | Status: DC
  Administered 2022-04-29 – 2022-04-30 (×5): 3 [IU] via SUBCUTANEOUS

## 2022-04-28 MED ORDER — INSULIN GLARGINE-YFGN 100 UNIT/ML ~~LOC~~ SOLN
16.0000 [IU] | SUBCUTANEOUS | Status: DC
  Administered 2022-04-29 – 2022-04-30 (×2): 16 [IU] via SUBCUTANEOUS
  Filled 2022-04-28 (×2): qty 0.16

## 2022-04-28 NOTE — Inpatient Diabetes Management (Signed)
Inpatient Diabetes Program Recommendations  AACE/ADA: New Consensus Statement on Inpatient Glycemic Control (2015)  Target Ranges:  Prepandial:   less than 140 mg/dL      Peak postprandial:   less than 180 mg/dL (1-2 hours)      Critically ill patients:  140 - 180 mg/dL   Lab Results  Component Value Date   GLUCAP 200 (H) 04/28/2022   HGBA1C 6.9 (A) 03/10/2022    Review of Glycemic Control  Latest Reference Range & Units 04/27/22 20:10 04/28/22 00:08 04/28/22 04:14 04/28/22 08:08  Glucose-Capillary 70 - 99 mg/dL 606 (H) 004 (H) 599 (H) 200 (H)  (H): Data is abnormally high Admit with: Acute on chronic pancreatitis/ DKA   History: Type 1 Diabetes, Chronic Pancreatitis   Home DM Meds: Novolog Max 30 units                             Lantus 16 units Daily                             Dexcom/ Freestyle CGM                             Omni Pod Insulin Pump   Current Orders: Semglee 12 units QD, Novolog 0-9 units Q4H     Endocrinologist: Dr. Lonzo Cloud with [facility] Last seen 03/10/2022 Instructions given at that appt: Continue Lantus 16 units daily Increase NovoLog 5 units TID before every meal Correction factor: NovoLog (BG -130/50) Set up appt to train for the Omni Pod Insulin Pump     When you allow pt to transition to SQ Insulin today, please consider the following (Based on last ENDO appt insulin instructions):   1. Increase Semglee 16 units Daily   2. When diet resumes: Start Novolog Sensitive Correction Scale/ SSI (0-9 units) TID AC + HS and Start Novolog 3 units TID with meals for meal coverage HOLD of pt eats <50% meals  Thanks, Lujean Rave, MSN, RNC-OB Diabetes Coordinator [phone] (8a-5p)

## 2022-04-28 NOTE — Progress Notes (Signed)
PROGRESS NOTE    Carlos [name]  QDI:[id number] DOB: 27-Jun-1986 DOA: 04/26/2022 PCP: [name], MD    Brief Narrative:  36 year old male with a history of type 1 diabetes, chronic pancreatitis on Creon, admitted to the hospital with abdominal pain secondary to acute on chronic pancreatitis.  He also has a history of portal vein thrombosis and is on anticoagulation.  GI following for pancreatitis.  Placed on bowel rest, IV fluids and pain management.   Assessment & Plan:   Principal Problem:   Acute on chronic pancreatitis (HCC) Active Problems:   Diabetic ketoacidosis without coma associated with type 1 diabetes mellitus (HCC)   Portal vein thrombosis   Gastroesophageal reflux disease with esophagitis without hemorrhage   History of alcohol abuse   Acute on chronic pancreatitis Pseudocyst -GI following -MRCP today -Currently on clear liquids -Continue IV fluids -We will start on oral pain medication the hopes of weaning him off IV -Continue on Creon  Diabetic ketoacidosis and type I diabetic -Currently on basal/bolus insulin -Anion gap normal -Blood sugars have started to trend up now that p.o. intake has started -Increase Semglee to 16 units daily, start on sliding scale coverage with meals  Portal vein thrombosis -Chronically on Eliquis  GERD -Continue PPI  Hypokalemia -Replace   DVT prophylaxis:  apixaban (ELIQUIS) tablet 5 mg  Code Status: Full code Family Communication: Discussed with patient Disposition Plan: Status is: Inpatient Remains inpatient appropriate because: Continued management of abdominal pain and pancreatitis     Consultants:  GI  Procedures:    Antimicrobials:      Subjective: Reported tolerating clear liquids today.  No vomiting.  Still has abdominal pain.  Having bowel movements.  Objective: Vitals:   04/28/22 0150 04/28/22 0531 04/28/22 1026 04/28/22 1406  BP: (!) 134/92 128/83 (!) 134/91 (!) 147/99  Pulse: 73 70 80  70  Resp: 18 18 18 17   Temp: (!) 97.5 F (36.4 C) 98.2 F (36.8 C) 99 F (37.2 C) 98.1 F (36.7 C)  TempSrc: Oral Oral Oral Oral  SpO2: 100% 100% 100% 100%  Weight:      Height:        Intake/Output Summary (Last 24 hours) at 04/28/2022 1829 Last data filed at 04/28/2022 0900 Gross per 24 hour  Intake 1079.7 ml  Output --  Net 1079.7 ml   Filed Weights   04/26/22 1638  Weight: 72.6 kg    Examination:  General exam: Appears calm and comfortable  Respiratory system: Clear to auscultation. Respiratory effort normal. Cardiovascular system: S1 & S2 heard, RRR. No JVD, murmurs, rubs, gallops or clicks. No pedal edema. Gastrointestinal system: Abdomen is nondistended, soft and tender in right upper quadrant and epigastrium. No organomegaly or masses felt. Normal bowel sounds heard. Central nervous system: Alert and oriented. No focal neurological deficits. Extremities: Symmetric 5 x 5 power. Skin: No rashes, lesions or ulcers Psychiatry: Judgement and insight appear normal. Mood & affect appropriate.     Data Reviewed: I have personally reviewed following labs and imaging studies  CBC: Recent Labs  Lab 04/23/22 1254 04/26/22 1742 04/27/22 0534 04/28/22 0308  WBC 8.0 7.3 6.2 5.9  NEUTROABS  --  5.9 4.6  --   HGB 14.7 13.8 12.4* 13.8  HCT 42.6 41.6 36.6* 41.0  MCV 95.7 98.6 95.8 97.2  PLT 185 209 179 186   Basic Metabolic Panel: Recent Labs  Lab 04/23/22 1254 04/26/22 1742 04/27/22 0013 04/27/22 0534 04/28/22 0308  NA 129* 131* 134* 137 136  K 3.5 4.5 3.8 3.3* 3.0*  CL 97* 96* 100 104 99  CO2 23 20* 22 23 26   GLUCOSE 240* 504* 290* 148* 162*  BUN 9 8 6 7 6   CREATININE 0.94 1.03 0.82 0.79 0.71  CALCIUM 9.4 9.2 8.6* 8.9 8.8*  MG  --   --   --  1.7 1.8   GFR: Estimated Creatinine Clearance: 131.1 mL/min (by C-G formula based on SCr of 0.71 mg/dL). Liver Function Tests: Recent Labs  Lab 04/23/22 1254 04/26/22 1742 04/27/22 0534  AST 46* 21 11*  ALT 25  30 21   ALKPHOS 82 86 68  BILITOT 1.4* 1.4* 0.8  PROT 8.3* 8.0 6.8  ALBUMIN 4.5 4.0 3.3*   Recent Labs  Lab 04/23/22 1254 04/26/22 1742  LIPASE 183* 89*   No results for input(s): "AMMONIA" in the last 168 hours. Coagulation Profile: No results for input(s): "INR", "PROTIME" in the last 168 hours. Cardiac Enzymes: No results for input(s): "CKTOTAL", "CKMB", "CKMBINDEX", "TROPONINI" in the last 168 hours. BNP (last 3 results) No results for input(s): "PROBNP" in the last 8760 hours. HbA1C: No results for input(s): "HGBA1C" in the last 72 hours. CBG: Recent Labs  Lab 04/28/22 0008 04/28/22 0414 04/28/22 0808 04/28/22 1200 04/28/22 1646  GLUCAP 178* 238* 200* 162* 204*   Lipid Profile: Recent Labs    04/27/22 0953  TRIG 90   Thyroid Function Tests: No results for input(s): "TSH", "T4TOTAL", "FREET4", "T3FREE", "THYROIDAB" in the last 72 hours. Anemia Panel: No results for input(s): "VITAMINB12", "FOLATE", "FERRITIN", "TIBC", "IRON", "RETICCTPCT" in the last 72 hours. Sepsis Labs: Recent Labs  Lab 04/27/22 0013  LATICACIDVEN 1.0    No results found for this or any previous visit (from the past 240 hour(s)).       Radiology Studies: MR ABDOMEN MRCP W WO CONTAST  Result Date: 04/28/2022 CLINICAL DATA:  Pancreatitis, abdominal pain EXAM: MRI ABDOMEN WITHOUT AND WITH CONTRAST (INCLUDING MRCP) TECHNIQUE: Multiplanar multisequence MR imaging of the abdomen was performed both before and after the administration of intravenous contrast. Heavily T2-weighted images of the biliary and pancreatic ducts were obtained, and three-dimensional MRCP images were rendered by post processing. CONTRAST:  23mL GADAVIST GADOBUTROL 1 MMOL/ML IV SOLN COMPARISON:  CT abdomen and pelvis 04/23/2022 FINDINGS: Lower chest: No pleural effusions. Hepatobiliary: Liver is enlarged measuring 20.1 cm in length. No definite evidence of hepatic steatosis. No suspicious hepatic mass identified.  Gallbladder appears within normal limits. There is narrowing and subtle irregularity of the common bile duct with minimal intrahepatic ductal dilatation. Pancreas: Diffusely heterogeneous and edematous appearance, including evidence of multiple small pancreatic calcifications. Pancreatic duct is dilated throughout measuring up to 7 mm in diameter. Heterogeneous cystic lesion in the tail the pancreas measures up to 1.9 cm in size. Spleen:  Upper normal size. Adrenals/Urinary Tract: No masses identified. No evidence of hydronephrosis. Stomach/Bowel: No evidence of bowel obstruction. Vascular/Lymphatic: Extensive tortuous vascularity in the portacaval region consistent with previous cavernous transformation. The intrahepatic portal veins are grossly patent with mildly diminished caliber and irregularity. Other:  Trace ascites. Musculoskeletal: No suspicious bone lesions identified. IMPRESSION: 1. Evidence of acute on chronic pancreatitis. Pancreatic ductal dilatation measuring up to 7 mm. Chronic 1.9 cm heterogeneous cystic lesion in the tail the pancreas, likely a pseudocyst. 2. Narrowing and irregularity of the common bile duct which is likely secondary to surrounding pancreatic inflammatory changes and may be acute or chronic. Minimal likely associated intrahepatic ductal dilatation. 3. Hepatomegaly. 4. Cavernous transformation of the  portal vein. 5. Trace ascites. Electronically Signed   By: Jannifer Hick M.D.   On: 04/28/2022 11:59   MR 3D Recon At Scanner  Result Date: 04/28/2022 CLINICAL DATA:  Pancreatitis, abdominal pain EXAM: MRI ABDOMEN WITHOUT AND WITH CONTRAST (INCLUDING MRCP) TECHNIQUE: Multiplanar multisequence MR imaging of the abdomen was performed both before and after the administration of intravenous contrast. Heavily T2-weighted images of the biliary and pancreatic ducts were obtained, and three-dimensional MRCP images were rendered by post processing. CONTRAST:  59mL GADAVIST GADOBUTROL 1  MMOL/ML IV SOLN COMPARISON:  CT abdomen and pelvis 04/23/2022 FINDINGS: Lower chest: No pleural effusions. Hepatobiliary: Liver is enlarged measuring 20.1 cm in length. No definite evidence of hepatic steatosis. No suspicious hepatic mass identified. Gallbladder appears within normal limits. There is narrowing and subtle irregularity of the common bile duct with minimal intrahepatic ductal dilatation. Pancreas: Diffusely heterogeneous and edematous appearance, including evidence of multiple small pancreatic calcifications. Pancreatic duct is dilated throughout measuring up to 7 mm in diameter. Heterogeneous cystic lesion in the tail the pancreas measures up to 1.9 cm in size. Spleen:  Upper normal size. Adrenals/Urinary Tract: No masses identified. No evidence of hydronephrosis. Stomach/Bowel: No evidence of bowel obstruction. Vascular/Lymphatic: Extensive tortuous vascularity in the portacaval region consistent with previous cavernous transformation. The intrahepatic portal veins are grossly patent with mildly diminished caliber and irregularity. Other:  Trace ascites. Musculoskeletal: No suspicious bone lesions identified. IMPRESSION: 1. Evidence of acute on chronic pancreatitis. Pancreatic ductal dilatation measuring up to 7 mm. Chronic 1.9 cm heterogeneous cystic lesion in the tail the pancreas, likely a pseudocyst. 2. Narrowing and irregularity of the common bile duct which is likely secondary to surrounding pancreatic inflammatory changes and may be acute or chronic. Minimal likely associated intrahepatic ductal dilatation. 3. Hepatomegaly. 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 5. Trace ascites. Electronically Signed   By: Jannifer Hick M.D.   On: 04/28/2022 11:59        Scheduled Meds:  apixaban  5 mg Oral BID   insulin aspart  0-9 Units Subcutaneous Q4H   insulin glargine-yfgn  12 Units Subcutaneous Q24H   lipase/protease/amylase  36,000 Units Oral TID WC   pantoprazole  40 mg Oral  BID AC   potassium chloride  40 mEq Oral Q4H   sodium chloride flush  3 mL Intravenous Q12H   Continuous Infusions:  lactated ringers 100 mL/hr at 04/28/22 1615     LOS: 1 day    Time spent:    Erick Blinks, MD [facility]   If 7PM-7AM, please contact night-coverage [email or web address]  04/28/2022, 6:29 PM

## 2022-04-28 NOTE — Progress Notes (Signed)
  Transition of Care [facility]) Screening Note   Patient Details  Name: Carlos [name] Date of Birth: 05-Jan-1986   Transition of Care [facility] [place]) CM/SW Contact:    Amada Jupiter, LCSW Phone Number: 04/28/2022, 12:18 PM    Transition of Care Department [facility]) has reviewed patient and no TOC needs have been identified at this time. We will continue to monitor patient advancement through interdisciplinary progression rounds. If new patient transition needs arise, please place a TOC consult.

## 2022-04-28 NOTE — Plan of Care (Signed)
  Problem: Education: Goal: Ability to describe self-care measures that may prevent or decrease complications (Diabetes Survival Skills Education) will improve Outcome: Progressing   Problem: Coping: Goal: Ability to adjust to condition or change in health will improve Outcome: Progressing   Problem: Fluid Volume: Goal: Ability to maintain a balanced intake and output will improve Outcome: Progressing   Problem: Health Behavior/Discharge Planning: Goal: Ability to identify and utilize available resources and services will improve Outcome: Progressing Goal: Ability to manage health-related needs will improve Outcome: Progressing   Problem: Metabolic: Goal: Ability to maintain appropriate glucose levels will improve Outcome: Progressing   Problem: Nutritional: Goal: Maintenance of adequate nutrition will improve Outcome: Progressing Goal: Progress toward achieving an optimal weight will improve Outcome: Progressing   Problem: Skin Integrity: Goal: Risk for impaired skin integrity will decrease Outcome: Progressing   Problem: Tissue Perfusion: Goal: Adequacy of tissue perfusion will improve Outcome: Progressing   Problem: Health Behavior/Discharge Planning: Goal: Ability to manage health-related needs will improve Outcome: Progressing   Problem: Clinical Measurements: Goal: Ability to maintain clinical measurements within normal limits will improve Outcome: Progressing Goal: Will remain free from infection Outcome: Progressing Goal: Diagnostic test results will improve Outcome: Progressing Goal: Respiratory complications will improve Outcome: Progressing Goal: Cardiovascular complication will be avoided Outcome: Progressing   Problem: Activity: Goal: Risk for activity intolerance will decrease Outcome: Progressing   Problem: Nutrition: Goal: Adequate nutrition will be maintained Outcome: Progressing   Problem: Coping: Goal: Level of anxiety will  decrease Outcome: Progressing   Problem: Elimination: Goal: Will not experience complications related to bowel motility Outcome: Progressing Goal: Will not experience complications related to urinary retention Outcome: Progressing   Problem: Pain Managment: Goal: General experience of comfort will improve Outcome: Progressing   Problem: Skin Integrity: Goal: Risk for impaired skin integrity will decrease Outcome: Progressing   Problem: Education: Goal: Knowledge of Pancreatitis treatment and prevention will improve Outcome: Progressing   Problem: Nutritional: Goal: Ability to achieve adequate nutritional intake will improve Outcome: Progressing   Problem: Clinical Measurements: Goal: Complications related to the disease process, condition or treatment will be avoided or minimized Outcome: Progressing

## 2022-04-28 NOTE — Progress Notes (Signed)
error 

## 2022-04-28 NOTE — Telephone Encounter (Signed)
Patient called requesting a refill for Diladid. Rx was sent in this morning from [name], MD. Patient has Hospital follow up this upcoming Friday, 04/30/22 with Dinah.   No further action is required.

## 2022-04-28 NOTE — Progress Notes (Addendum)
Progress Note   Subjective  Chief Complaint: Abdominal pain and acute on chronic pancreatitis with pseudocyst     This morning, patient explains that he is having a "a little bit of pain", but no longer nauseous and in general is getting slowly better.  He tolerated sips of clears yesterday and would like to try something a little more today. He did have a BM last night and has no other new complaints or concerns.   Objective   Vital signs in last 24 hours: Temp:  [97.5 F (36.4 C)-98.6 F (37 C)] 98.2 F (36.8 C) (06/21 0531) Pulse Rate:  [66-80] 70 (06/21 0531) Resp:  [14-18] 18 (06/21 0531) BP: (127-158)/(82-100) 128/83 (06/21 0531) SpO2:  [99 %-100 %] 100 % (06/21 0531) Last BM Date : 04/26/22 General:  AA male in NAD Heart:  Regular rate and rhythm; no murmurs Lungs: Respirations even and unlabored, lungs CTA bilaterally Abdomen:  Soft, marked epigastric ttp with only light palpation and nondistended. Normal bowel sounds. Psych:  Cooperative. Normal mood and affect.  Intake/Output from previous day: 06/20 0701 - 06/21 0700 In: 1761.1 [I.V.:1761.1] Out: -   Lab Results: Recent Labs    04/26/22 1742 04/27/22 0534 04/28/22 0308  WBC 7.3 6.2 5.9  HGB 13.8 12.4* 13.8  HCT 41.6 36.6* 41.0  PLT 209 179 186   BMET Recent Labs    04/27/22 0013 04/27/22 0534 04/28/22 0308  NA 134* 137 136  K 3.8 3.3* 3.0*  CL 100 104 99  CO2 22 23 26   GLUCOSE 290* 148* 162*  BUN 6 7 6   CREATININE 0.82 0.79 0.71  CALCIUM 8.6* 8.9 8.8*   LFT Recent Labs    04/27/22 0534  PROT 6.8  ALBUMIN 3.3*  AST 11*  ALT 21  ALKPHOS 68  BILITOT 0.8     Assessment / Plan:    Assessment: 1.  Acute on chronic pancreatitis: History of pseudocyst, splenic vein thrombosis, new onset diabetes, CT abdomen pelvis 6/19 showed acute on chronic pancreatitis with moderate peripancreatic fat stranding, did show decreased pancreatic collections of the tail and proximal collection and no new  peripancreatic collections, patient's pain is slowly improving, he does have appointment with [facility] at the end of July to evaluate advanced endoscopic options 2.  Splenic vein thrombosis 3.  Gastritis negative for H. pylori: 1/23 EGD with Dr. August showed grade D esophagitis, CT in the ER showed mild distal esophageal wall thickening reactive in the setting of pancreatitis versus reflux, currently on Prilosec 40 mg once daily   Plan: 1.  Discussion with Dr. 2/23 recommending an MRI/MRCP this hospitalization, we will order this as patient's pain seems manageable. 2.  Ordered a clear liquid diet for the patient to try today. 3.  Continue Pantoprazole 40 mg twice daily for esophagitis 4.  Okay with decrease in Lactated Ringer's 100 cc/hr as ordered by the hospitalist  Thank you for kind consultation, we will continue to follow along.   LOS: 1 day   [name]  04/28/2022, 9:16 AM     Attending Physician Note   I have taken an interval history, reviewed the chart and examined the patient. I performed a substantive portion of this encounter, including complete performance of at least one of the key components, in conjunction with the APP. I agree with the APP's note, impression and recommendations with my edits. My additional impressions and recommendations are as follows.   Unk Lightning, MD [facility] See Claudette Head, [facility], for our on call provider

## 2022-04-29 DIAGNOSIS — K2100 Gastro-esophageal reflux disease with esophagitis, without bleeding: Secondary | ICD-10-CM | POA: Diagnosis not present

## 2022-04-29 DIAGNOSIS — K861 Other chronic pancreatitis: Secondary | ICD-10-CM | POA: Diagnosis not present

## 2022-04-29 DIAGNOSIS — K859 Acute pancreatitis without necrosis or infection, unspecified: Secondary | ICD-10-CM | POA: Diagnosis not present

## 2022-04-29 LAB — BASIC METABOLIC PANEL
Anion gap: 9 (ref 5–15)
BUN: 7 mg/dL (ref 6–20)
CO2: 28 mmol/L (ref 22–32)
Calcium: 9.1 mg/dL (ref 8.9–10.3)
Chloride: 101 mmol/L (ref 98–111)
Creatinine, Ser: 0.65 mg/dL (ref 0.61–1.24)
GFR, Estimated: >60 mL/min (ref >60)
Glucose, Bld: 150 mg/dL — ABNORMAL HIGH (ref 70–99)
Potassium: 3.6 mmol/L (ref 3.5–5.1)
Sodium: 138 mmol/L (ref 135–145)

## 2022-04-29 LAB — GLUCOSE, CAPILLARY
Glucose-Capillary: 160 mg/dL — ABNORMAL HIGH (ref 70–99)
Glucose-Capillary: 282 mg/dL — ABNORMAL HIGH (ref 70–99)
Glucose-Capillary: 138 mg/dL — ABNORMAL HIGH (ref 70–99)
Glucose-Capillary: 248 mg/dL — ABNORMAL HIGH (ref 70–99)

## 2022-04-29 NOTE — Plan of Care (Signed)
  Problem: Fluid Volume: Goal: Ability to maintain a balanced intake and output will improve Outcome: Progressing   Problem: Health Behavior/Discharge Planning: Goal: Ability to manage health-related needs will improve Outcome: Progressing   Problem: Metabolic: Goal: Ability to maintain appropriate glucose levels will improve Outcome: Progressing   Problem: Nutritional: Goal: Maintenance of adequate nutrition will improve Outcome: Progressing Goal: Progress toward achieving an optimal weight will improve Outcome: Progressing   Problem: Skin Integrity: Goal: Risk for impaired skin integrity will decrease Outcome: Progressing   Problem: Health Behavior/Discharge Planning: Goal: Ability to manage health-related needs will improve Outcome: Progressing   Problem: Clinical Measurements: Goal: Ability to maintain clinical measurements within normal limits will improve Outcome: Progressing Goal: Will remain free from infection Outcome: Progressing Goal: Diagnostic test results will improve Outcome: Progressing Goal: Respiratory complications will improve Outcome: Progressing Goal: Cardiovascular complication will be avoided Outcome: Progressing   Problem: Nutrition: Goal: Adequate nutrition will be maintained Outcome: Progressing   Problem: Coping: Goal: Level of anxiety will decrease Outcome: Progressing   Problem: Elimination: Goal: Will not experience complications related to bowel motility Outcome: Progressing   Problem: Pain Managment: Goal: General experience of comfort will improve Outcome: Progressing   Problem: Education: Goal: Knowledge of Pancreatitis treatment and prevention will improve Outcome: Progressing   Problem: Nutritional: Goal: Ability to achieve adequate nutritional intake will improve Outcome: Progressing   Problem: Clinical Measurements: Goal: Complications related to the disease process, condition or treatment will be avoided or  minimized Outcome: Progressing

## 2022-04-29 NOTE — Progress Notes (Signed)
PROGRESS NOTE    Carlos [name]  ONG:[id number] DOB: [birth year] DOA: 04/26/2022 PCP: [name], MD    Brief Narrative:  36 year old male with a history of type 1 diabetes, chronic pancreatitis on Creon, admitted to the hospital with abdominal pain secondary to acute on chronic pancreatitis.  He also has a history of portal vein thrombosis and is on anticoagulation.  GI following for pancreatitis.  Placed on bowel rest, IV fluids and pain management.   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  Recommendations are to follow-up with Dr. [name] -Currently on clear liquids, will advance as tolerated to full liquids -Continue IV fluids -Start to wean off IV pain medicine -Continue on Creon  Diabetic ketoacidosis and type I diabetic -Currently on basal/bolus insulin -Anion gap normal -Blood sugars have started to trend up now that p.o. intake has started -Increase Semglee to 16 units daily, start on sliding scale coverage with meals  Portal vein thrombosis -Chronically on Eliquis  GERD -Continue PPI  Hypokalemia -Replace   DVT prophylaxis:  apixaban (ELIQUIS) tablet 5 mg  Code Status: Full code Family Communication: Discussed with patient Disposition Plan: Status is: Inpatient Remains inpatient appropriate because: Continued management of abdominal pain and pancreatitis     Consultants:  GI  Procedures:    Antimicrobials:      Subjective: Tolerating clear liquids, wants to advance diet.  No  vomiting.  Still has pain, but feels that overall it is doing better  Objective: Vitals:   04/28/22 1406 04/28/22 2244 04/29/22 0621 04/29/22 1355  BP: (!) 147/99 (!) 141/102 (!) 145/99 (!) 142/98  Pulse: 70 67 63 76  Resp: 17 16 18 16   Temp: 98.1 F (36.7 C) 98.4 F (36.9 C) 98 F (36.7 C) 98.7 F (37.1 C)  TempSrc: Oral Oral Oral Oral  SpO2: 100% 100% 100% 100%  Weight:      Height:        Intake/Output Summary (Last 24 hours) at 04/29/2022 1920 Last data filed at 04/29/2022 0657 Gross per 24 hour  Intake 1861.21 ml  Output --  Net 1861.21 ml   Filed Weights   04/26/22 1638  Weight: 72.6 kg    Examination:  General exam: Appears calm and comfortable  Respiratory system: Clear to auscultation. Respiratory effort normal. Cardiovascular system: S1 & S2 heard, RRR. No JVD, murmurs, rubs, gallops or clicks. No pedal edema. Gastrointestinal system: Abdomen is nondistended, soft and tender in right upper quadrant and epigastrium. No organomegaly or masses felt. Normal bowel sounds heard. Central nervous system: Alert and oriented. No focal neurological deficits. Extremities: Symmetric 5 x 5 power. Skin: No rashes, lesions or ulcers Psychiatry: Judgement and insight appear normal. Mood & affect appropriate.     Data Reviewed: I have personally reviewed following labs and imaging studies  CBC: Recent Labs  Lab 04/23/22 1254 04/26/22 1742 04/27/22 0534 04/28/22 0308  WBC 8.0 7.3 6.2 5.9  NEUTROABS  --  5.9 4.6  --   HGB 14.7 13.8 12.4* 13.8  HCT 42.6 41.6 36.6* 41.0  MCV 95.7 98.6  95.8 97.2  PLT 185 209 179 186   Basic Metabolic Panel: Recent Labs  Lab 04/26/22 1742 04/27/22 0013 04/27/22 0534 04/28/22 0308 04/29/22 0312  NA 131* 134* 137 136 138  K 4.5 3.8 3.3* 3.0* 3.6  CL 96* 100 104 99 101  CO2 20* 22 23 26 28   GLUCOSE 504* 290* 148* 162* 150*  BUN 8 6 7 6 7   CREATININE 1.03 0.82 0.79 0.71 0.65  CALCIUM 9.2 8.6* 8.9 8.8* 9.1  MG  --   --  1.7 1.8   --    GFR: Estimated Creatinine Clearance: 131.1 mL/min (by C-G formula based on SCr of 0.65 mg/dL). Liver Function Tests: Recent Labs  Lab 04/23/22 1254 04/26/22 1742 04/27/22 0534  AST 46* 21 11*  ALT 25 30 21   ALKPHOS 82 86 68  BILITOT 1.4* 1.4* 0.8  PROT 8.3* 8.0 6.8  ALBUMIN 4.5 4.0 3.3*   Recent Labs  Lab 04/23/22 1254 04/26/22 1742  LIPASE 183* 89*   No results for input(s): "AMMONIA" in the last 168 hours. Coagulation Profile: No results for input(s): "INR", "PROTIME" in the last 168 hours. Cardiac Enzymes: No results for input(s): "CKTOTAL", "CKMB", "CKMBINDEX", "TROPONINI" in the last 168 hours. BNP (last 3 results) No results for input(s): "PROBNP" in the last 8760 hours. HbA1C: No results for input(s): "HGBA1C" in the last 72 hours. CBG: Recent Labs  Lab 04/28/22 1942 04/28/22 2247 04/29/22 0743 04/29/22 1133 04/29/22 1634  GLUCAP 233* 213* 160* 282* 138*   Lipid Profile: Recent Labs    04/27/22 0953  TRIG 90   Thyroid Function Tests: No results for input(s): "TSH", "T4TOTAL", "FREET4", "T3FREE", "THYROIDAB" in the last 72 hours. Anemia Panel: No results for input(s): "VITAMINB12", "FOLATE", "FERRITIN", "TIBC", "IRON", "RETICCTPCT" in the last 72 hours. Sepsis Labs: Recent Labs  Lab 04/27/22 0013  LATICACIDVEN 1.0    No results found for this or any previous visit (from the past 240 hour(s)).       Radiology Studies: MR ABDOMEN MRCP W WO CONTAST  Result Date: 04/28/2022 CLINICAL DATA:  Pancreatitis, abdominal pain EXAM: MRI ABDOMEN WITHOUT AND WITH CONTRAST (INCLUDING MRCP) TECHNIQUE: Multiplanar multisequence MR imaging of the abdomen was performed both before and after the administration of intravenous contrast. Heavily T2-weighted images of the biliary and pancreatic ducts were obtained, and three-dimensional MRCP images were rendered by post processing. CONTRAST:  89mL GADAVIST GADOBUTROL 1 MMOL/ML IV SOLN COMPARISON:  CT abdomen and  pelvis 04/23/2022 FINDINGS: Lower chest: No pleural effusions. Hepatobiliary: Liver is enlarged measuring 20.1 cm in length. No definite evidence of hepatic steatosis. No suspicious hepatic mass identified. Gallbladder appears within normal limits. There is narrowing and subtle irregularity of the common bile duct with minimal intrahepatic ductal dilatation. Pancreas: Diffusely heterogeneous and edematous appearance, including evidence of multiple small pancreatic calcifications. Pancreatic duct is dilated throughout measuring up to 7 mm in diameter. Heterogeneous cystic lesion in the tail the pancreas measures up to 1.9 cm in size. Spleen:  Upper normal size. Adrenals/Urinary Tract: No masses identified. No evidence of hydronephrosis. Stomach/Bowel: No evidence of bowel obstruction. Vascular/Lymphatic: Extensive tortuous vascularity in the portacaval region consistent with previous cavernous transformation. The intrahepatic portal veins are grossly patent with mildly diminished caliber and irregularity. Other:  Trace ascites. Musculoskeletal: No suspicious bone lesions identified. IMPRESSION: 1. Evidence of acute on chronic pancreatitis. Pancreatic ductal dilatation measuring up to 7 mm. Chronic 1.9 cm heterogeneous cystic lesion in the tail the pancreas, likely a pseudocyst. 2. Narrowing  and irregularity of the common bile duct which is likely secondary to surrounding pancreatic inflammatory changes and may be acute or chronic. Minimal likely associated intrahepatic ductal dilatation. 3. Hepatomegaly. 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 5. Trace ascites. Electronically Signed   By: Jannifer Hick M.D.   On: 04/28/2022 11:59   MR 3D Recon At Scanner  Result Date: 04/28/2022 CLINICAL DATA:  Pancreatitis, abdominal pain EXAM: MRI ABDOMEN WITHOUT AND WITH CONTRAST (INCLUDING MRCP) TECHNIQUE: Multiplanar multisequence MR imaging of the abdomen was performed both before and after the administration of  intravenous contrast. Heavily T2-weighted images of the biliary and pancreatic ducts were obtained, and three-dimensional MRCP images were rendered by post processing. CONTRAST:  54mL GADAVIST GADOBUTROL 1 MMOL/ML IV SOLN COMPARISON:  CT abdomen and pelvis 04/23/2022 FINDINGS: Lower chest: No pleural effusions. Hepatobiliary: Liver is enlarged measuring 20.1 cm in length. No definite evidence of hepatic steatosis. No suspicious hepatic mass identified. Gallbladder appears within normal limits. There is narrowing and subtle irregularity of the common bile duct with minimal intrahepatic ductal dilatation. Pancreas: Diffusely heterogeneous and edematous appearance, including evidence of multiple small pancreatic calcifications. Pancreatic duct is dilated throughout measuring up to 7 mm in diameter. Heterogeneous cystic lesion in the tail the pancreas measures up to 1.9 cm in size. Spleen:  Upper normal size. Adrenals/Urinary Tract: No masses identified. No evidence of hydronephrosis. Stomach/Bowel: No evidence of bowel obstruction. Vascular/Lymphatic: Extensive tortuous vascularity in the portacaval region consistent with previous cavernous transformation. The intrahepatic portal veins are grossly patent with mildly diminished caliber and irregularity. Other:  Trace ascites. Musculoskeletal: No suspicious bone lesions identified. IMPRESSION: 1. Evidence of acute on chronic pancreatitis. Pancreatic ductal dilatation measuring up to 7 mm. Chronic 1.9 cm heterogeneous cystic lesion in the tail the pancreas, likely a pseudocyst. 2. Narrowing and irregularity of the common bile duct which is likely secondary to surrounding pancreatic inflammatory changes and may be acute or chronic. Minimal likely associated intrahepatic ductal dilatation. 3. Hepatomegaly. 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 5. Trace ascites. Electronically Signed   By: Jannifer Hick M.D.   On: 04/28/2022 11:59        Scheduled Meds:   apixaban  5 mg Oral BID   insulin aspart  0-5 Units Subcutaneous QHS   insulin aspart  0-9 Units Subcutaneous TID WC   insulin aspart  3 Units Subcutaneous TID WC   insulin glargine-yfgn  16 Units Subcutaneous Q24H   lipase/protease/amylase  36,000 Units Oral TID WC   pantoprazole  40 mg Oral BID AC   sodium chloride flush  3 mL Intravenous Q12H   Continuous Infusions:  lactated ringers 100 mL/hr at 04/29/22 1246     LOS: 2 days    Time spent:    Erick Blinks, MD [facility]   If 7PM-7AM, please contact night-coverage [email or web address]  04/29/2022, 7:20 PM

## 2022-04-29 NOTE — Progress Notes (Addendum)
Progress Note   Subjective  Chief Complaint: Abdominal pain and acute on chronic pancreatitis with pseudocyst  This morning, patient tells me he still having some pain but he feels like that is what is going to be his "normal" for right now.  He is tolerating a clear liquid diet and would like to try something else.  He wonders when he would be able to leave but does not want to leave too soon as he ended up back in the hospital shortly after his last discharge.   Objective   Vital signs in last 24 hours: Temp:  [98 F (36.7 C)-99 F (37.2 C)] 98 F (36.7 C) (06/22 0621) Pulse Rate:  [63-80] 63 (06/22 0621) Resp:  [16-18] 18 (06/22 0621) BP: (134-147)/(91-102) 145/99 (06/22 0621) SpO2:  [100 %] 100 % (06/22 0621) Last BM Date : 04/26/22 General: AA male in NAD Heart:  Regular rate and rhythm; no murmurs Lungs: Respirations even and unlabored, lungs CTA bilaterally Abdomen:  Soft, moderate epigastric ttp and nondistended. Normal bowel sounds. Psych:  Cooperative. Normal mood and affect.  Intake/Output from previous day: 06/21 0701 - 06/22 0700 In: 1981.2 [P.O.:840; I.V.:1141.2] Out: -   Lab Results: Recent Labs    04/26/22 1742 04/27/22 0534 04/28/22 0308  WBC 7.3 6.2 5.9  HGB 13.8 12.4* 13.8  HCT 41.6 36.6* 41.0  PLT 209 179 186   BMET Recent Labs    04/27/22 0534 04/28/22 0308 04/29/22 0312  NA 137 136 138  K 3.3* 3.0* 3.6  CL 104 99 101  CO2 23 26 28   GLUCOSE 148* 162* 150*  BUN 7 6 7   CREATININE 0.79 0.71 0.65  CALCIUM 8.9 8.8* 9.1   LFT Recent Labs    04/27/22 0534  PROT 6.8  ALBUMIN 3.3*  AST 11*  ALT 21  ALKPHOS 68  BILITOT 0.8   Studies/Results: MR ABDOMEN MRCP W WO CONTAST  Result Date: 04/28/2022 CLINICAL DATA:  Pancreatitis, abdominal pain EXAM: MRI ABDOMEN WITHOUT AND WITH CONTRAST (INCLUDING MRCP) TECHNIQUE: Multiplanar multisequence MR imaging of the abdomen was performed both before and after the administration of intravenous  contrast. Heavily T2-weighted images of the biliary and pancreatic ducts were obtained, and three-dimensional MRCP images were rendered by post processing. CONTRAST:  7mL GADAVIST GADOBUTROL 1 MMOL/ML IV SOLN COMPARISON:  CT abdomen and pelvis 04/23/2022 FINDINGS: Lower chest: No pleural effusions. Hepatobiliary: Liver is enlarged measuring 20.1 cm in length. No definite evidence of hepatic steatosis. No suspicious hepatic mass identified. Gallbladder appears within normal limits. There is narrowing and subtle irregularity of the common bile duct with minimal intrahepatic ductal dilatation. Pancreas: Diffusely heterogeneous and edematous appearance, including evidence of multiple small pancreatic calcifications. Pancreatic duct is dilated throughout measuring up to 7 mm in diameter. Heterogeneous cystic lesion in the tail the pancreas measures up to 1.9 cm in size. Spleen:  Upper normal size. Adrenals/Urinary Tract: No masses identified. No evidence of hydronephrosis. Stomach/Bowel: No evidence of bowel obstruction. Vascular/Lymphatic: Extensive tortuous vascularity in the portacaval region consistent with previous cavernous transformation. The intrahepatic portal veins are grossly patent with mildly diminished caliber and irregularity. Other:  Trace ascites. Musculoskeletal: No suspicious bone lesions identified. IMPRESSION: 1. Evidence of acute on chronic pancreatitis. Pancreatic ductal dilatation measuring up to 7 mm. Chronic 1.9 cm heterogeneous cystic lesion in the tail the pancreas, likely a pseudocyst. 2. Narrowing and irregularity of the common bile duct which is likely secondary to surrounding pancreatic inflammatory changes and may be acute  or chronic. Minimal likely associated intrahepatic ductal dilatation. 3. Hepatomegaly. 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 5. Trace ascites. Electronically Signed   By: Jannifer Hick M.D.   On: 04/28/2022 11:59   MR 3D Recon At Scanner  Result Date:  04/28/2022 CLINICAL DATA:  Pancreatitis, abdominal pain EXAM: MRI ABDOMEN WITHOUT AND WITH CONTRAST (INCLUDING MRCP) TECHNIQUE: Multiplanar multisequence MR imaging of the abdomen was performed both before and after the administration of intravenous contrast. Heavily T2-weighted images of the biliary and pancreatic ducts were obtained, and three-dimensional MRCP images were rendered by post processing. CONTRAST:  7mL GADAVIST GADOBUTROL 1 MMOL/ML IV SOLN COMPARISON:  CT abdomen and pelvis 04/23/2022 FINDINGS: Lower chest: No pleural effusions. Hepatobiliary: Liver is enlarged measuring 20.1 cm in length. No definite evidence of hepatic steatosis. No suspicious hepatic mass identified. Gallbladder appears within normal limits. There is narrowing and subtle irregularity of the common bile duct with minimal intrahepatic ductal dilatation. Pancreas: Diffusely heterogeneous and edematous appearance, including evidence of multiple small pancreatic calcifications. Pancreatic duct is dilated throughout measuring up to 7 mm in diameter. Heterogeneous cystic lesion in the tail the pancreas measures up to 1.9 cm in size. Spleen:  Upper normal size. Adrenals/Urinary Tract: No masses identified. No evidence of hydronephrosis. Stomach/Bowel: No evidence of bowel obstruction. Vascular/Lymphatic: Extensive tortuous vascularity in the portacaval region consistent with previous cavernous transformation. The intrahepatic portal veins are grossly patent with mildly diminished caliber and irregularity. Other:  Trace ascites. Musculoskeletal: No suspicious bone lesions identified. IMPRESSION: 1. Evidence of acute on chronic pancreatitis. Pancreatic ductal dilatation measuring up to 7 mm. Chronic 1.9 cm heterogeneous cystic lesion in the tail the pancreas, likely a pseudocyst. 2. Narrowing and irregularity of the common bile duct which is likely secondary to surrounding pancreatic inflammatory changes and may be acute or chronic. Minimal  likely associated intrahepatic ductal dilatation. 3. Hepatomegaly. 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 5. Trace ascites. Electronically Signed   By: Jannifer Hick M.D.   On: 04/28/2022 11:59     Assessment / Plan:    Assessment:  Acute on chronic pancreatitis: History of pseudocyst, splenic vein thrombosis, new onset diabetes, CTAP 6/19 with acute on chronic pancreatitis with moderate peripancreatic fat stranding, did show decreased pancreatic collections the tail proximal collection and no new pancreatic collections, MRI/MRCP as above completed yesterday, patient has eval at [facility] at the end of July to evaluate advanced endoscopic options, symptoms are improving slowly here Splenic vein thrombosis Gastritis negative for H. pylori  Plan: 1.  Patient seems to be slowly improving.  Advanced his diet to full liquids for now with advance as tolerated orders placed and discussed this with him further.  If he is not having any increase in pain or nausea/vomiting when eating then he can advance to a low-fat diet. 2.  Continue Pantoprazole 40 mg twice daily 3.  We have reached out to Dr. [name] for his recommendations in regards to MRI/MRCP above, likely this does not change the patient's course for now.  He does have follow-up with [facility] in late July. 4.  Patient can likely go home in the next 24 hours.  Thank you for your kind consultation, we will continue following.   LOS: 2 days   Unk Lightning  04/29/2022, 10:04 AM     Attending Physician Note   I have taken an interval history, reviewed the chart and examined the patient. I performed a substantive portion of this encounter, including complete performance of at  least one of the key components, in conjunction with the APP. I agree with the APP's note, impression and recommendations with my edits. My additional impressions and recommendations are as follows.   *Acute on chronic pancreatitis clinically improving.  Advance diet gradually as tolerated. MRI/MRCP with acute/chronic pancreatitis, dilated PD, 1.9 cm pancreatic tail cystic lesion, narrowed CBD likely due to acute and chronic pancreatitis, mild intrahepatic ductal dilation, cavernous transformation of the PV, trace ascites. If his pain and nausea are adequately control anticipate discharge tomorrow.   *History of Grade D esophagitis. Continue pantoprazole 40 mg po bid and follow antireflux measures as outpatient.   *Outpatient GI follow up with Dr. [name]. GI signing off.   Claudette Head, MD [facility], [facility], for our on call provider

## 2022-04-29 NOTE — Plan of Care (Signed)
Plan of care reviewed and discussed with the patient. 

## 2022-04-29 NOTE — Telephone Encounter (Signed)
The medication is controlled and has to be sent by a provider

## 2022-04-29 NOTE — Telephone Encounter (Signed)
Recommend sending script to [facility] if medication still on backorder at your pharmacy.

## 2022-04-29 NOTE — Telephone Encounter (Signed)
Messgae forwarded to [name], MD  and his NP

## 2022-04-30 ENCOUNTER — Encounter: Payer: Self-pay | Admitting: Family

## 2022-04-30 ENCOUNTER — Ambulatory Visit (INDEPENDENT_AMBULATORY_CARE_PROVIDER_SITE_OTHER): Payer: Medicaid Other | Admitting: Family

## 2022-04-30 ENCOUNTER — Telehealth: Payer: Self-pay

## 2022-04-30 DIAGNOSIS — R1013 Epigastric pain: Secondary | ICD-10-CM | POA: Diagnosis not present

## 2022-04-30 DIAGNOSIS — G8929 Other chronic pain: Secondary | ICD-10-CM

## 2022-04-30 DIAGNOSIS — K860 Alcohol-induced chronic pancreatitis: Secondary | ICD-10-CM | POA: Diagnosis not present

## 2022-04-30 DIAGNOSIS — K861 Other chronic pancreatitis: Secondary | ICD-10-CM

## 2022-04-30 DIAGNOSIS — F1020 Alcohol dependence, uncomplicated: Secondary | ICD-10-CM

## 2022-04-30 DIAGNOSIS — K863 Pseudocyst of pancreas: Secondary | ICD-10-CM

## 2022-04-30 LAB — BASIC METABOLIC PANEL
Anion gap: 7 (ref 5–15)
BUN: 9 mg/dL (ref 6–20)
CO2: 31 mmol/L (ref 22–32)
Calcium: 9.5 mg/dL (ref 8.9–10.3)
Chloride: 100 mmol/L (ref 98–111)
Creatinine, Ser: 0.81 mg/dL (ref 0.61–1.24)
GFR, Estimated: >60 mL/min (ref >60)
Glucose, Bld: 226 mg/dL — ABNORMAL HIGH (ref 70–99)
Potassium: 4.5 mmol/L (ref 3.5–5.1)
Sodium: 138 mmol/L (ref 135–145)

## 2022-04-30 LAB — GLUCOSE, CAPILLARY
Glucose-Capillary: 245 mg/dL — ABNORMAL HIGH (ref 70–99)
Glucose-Capillary: 131 mg/dL — ABNORMAL HIGH (ref 70–99)

## 2022-04-30 MED ORDER — HYDROMORPHONE HCL 2 MG PO TABS: 4.0000 mg | ORAL_TABLET | Freq: Four times a day (QID) | ORAL | 0 refills | Status: DC | PRN

## 2022-04-30 NOTE — Progress Notes (Signed)
Provider: Sevannah Madia FNP-C  [name], MD  Patient Care Team: [name], MD as PCP - General (Family Medicine) Flo Shanks, MD as Consulting Physician (Otolaryngology)  Extended Emergency Contact Information Primary Emergency Contact: brathwaite,[email or web address] Address: [address]          [place], [place] [address] D[place] of [place] Home Phone: [phone] Work Phone: [phone] Mobile Phone: [phone] Relation: Significant other  Code Status:  Full  Goals of care: Advanced Directive information    04/30/2022    3:12 PM  Advanced Directives  Does Patient Have a Medical Advance Directive? No  Would patient like information on creating a medical advance directive? No - Patient declined     Chief Complaint  Patient presents with   Hospitalization Follow-up    Patient is follow up for a couple hospital visits.     HPI:  Pt is a 36 y.o. male seen today for an acute visit for hospital follow up.He was in the ED 04/23/2022  for chronic pancreatitis and 04/26/2022 - 04/30/2022 for acute pancreatitis.she was discharged today but states still has Mid - Epigastric pain radiation to right upper quadrant and mid back Pain though has improved compared to when he went to the hospital. Lab work done in the hospital this morning Glucose 226 down to 131 on discharge.  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    He request refill for hydromorphone 4 mg tablet one by mouth every 6 hrs PRN states went to pick up medication but [facility] told him they were out of the 4 mg tablet but has few of 2 mg tablet.[facility] staff called and verified 2 mg tablet was available # 214 but  patient can refill the rest of the medication when received.  Patient agreed to switch to 2 mg tablet to take 2 tablets( 4 mg )  every 6 hrs as needed.    Past Medical History:  Diagnosis Date   AKI (acute kidney injury) (HCC) 12/21/2021   Anxiety    Diabetes (HCC)    Pancreatitis    Splenic vein thrombosis    Past Surgical History:  Procedure Laterality Date   BIOPSY  12/02/2021   Procedure: BIOPSY;  Surgeon: Lemar Lofty., MD;  Location: [facility];  Service: Gastroenterology;;   ESOPHAGOGASTRODUODENOSCOPY (EGD) WITH PROPOFOL N/A 12/02/2021   Procedure: ESOPHAGOGASTRODUODENOSCOPY (EGD) WITH PROPOFOL;  Surgeon: Lemar Lofty., MD;  Location: [facility];  Service: Gastroenterology;  Laterality: N/A;   NO PAST SURGERIES     UPPER ESOPHAGEAL ENDOSCOPIC ULTRASOUND (EUS) N/A 12/02/2021   Procedure: UPPER ESOPHAGEAL ENDOSCOPIC ULTRASOUND (EUS);  Surgeon: Lemar Lofty., MD;  Location: [facility];  Service: Gastroenterology;  Laterality: N/A;    Allergies  Allergen Reactions   Other Anaphylaxis    [name] syrup    Outpatient Encounter Medications as of 04/30/2022  Medication Sig   apixaban (ELIQUIS) 5 MG TABS tablet Take 1 tablet (5 mg total) by mouth 2 (two) times daily.   blood glucose meter kit and supplies KIT Dispense based on patient and insurance preference. Use up to four times daily as directed.   Continuous Blood Gluc Sensor (DEXCOM G6 SENSOR) MISC 1 Device by Does not apply route  as directed.   Continuous Blood Gluc Sensor (FREESTYLE LIBRE 2 SENSOR) MISC Change every 14 days   Continuous Blood Gluc Transmit (DEXCOM G6 TRANSMITTER) MISC 1 Device by Does not apply route as directed.   HYDROmorphone (DILAUDID) 4 MG tablet Take 1 tablet (4 mg total) by mouth every 6 (six) hours as needed for severe pain.   insulin aspart (NOVOLOG) 100 UNIT/ML FlexPen Max daily 30 units   Insulin Disposable Pump (OMNIPOD 5 G6 INTRO, GEN 5,) KIT 1 Device by Does  not apply route every 3 (three) days.   Insulin Disposable Pump (OMNIPOD 5 G6 POD, GEN 5,) MISC 1 Device by Does not apply route every 3 (three) days.   insulin glargine (LANTUS) 100 UNIT/ML Solostar Pen Inject 16 Units into the skin daily.   Insulin Pen Needle 32G X 4 MM MISC 1 Device by Does not apply route in the morning, at noon, in the evening, and at bedtime.   lipase/protease/amylase (CREON) 36000 UNITS CPEP capsule Take 2 capsules (72,000 Units total) by mouth 3 (three) times daily with meals. May also take 1 capsule (36,000 Units total) as needed (with snacks).   Multiple Vitamins-Minerals (ADVANCED DIABETIC MULTIVITAMIN PO) Take 1 tablet by mouth daily.   omeprazole (PRILOSEC) 40 MG capsule Take 1 capsule (40 mg total) by mouth daily.   ondansetron (ZOFRAN) 8 MG tablet Take 1 tablet (8 mg total) by mouth every 8 (eight) hours as needed for nausea or vomiting.   [DISCONTINUED] ondansetron (ZOFRAN) 8 MG tablet Take 1 tablet (8 mg total) by mouth every 8 (eight) hours as needed for nausea or vomiting. (Patient not taking: Reported on 04/23/2022)   [DISCONTINUED] oxyCODONE (ROXICODONE) 5 MG immediate release tablet Take 1 tablet (5 mg total) by mouth every 6 (six) hours as needed for severe pain. (Patient not taking: Reported on 04/23/2022)   [EXPIRED] gadobutrol (GADAVIST) 1 MMOL/ML injection 7 mL    [DISCONTINUED] acetaminophen (TYLENOL) suppository 650 mg    [DISCONTINUED] acetaminophen (TYLENOL) tablet 650 mg    [DISCONTINUED] apixaban (ELIQUIS) tablet 5 mg    [DISCONTINUED] dextrose 50 % solution 0-50 mL    [DISCONTINUED] HYDROmorphone (DILAUDID) injection 1 mg    [DISCONTINUED] HYDROmorphone (DILAUDID) injection 2 mg    [DISCONTINUED] HYDROmorphone (DILAUDID) tablet 4 mg    [DISCONTINUED] insulin aspart (novoLOG) injection 0-5 Units    [DISCONTINUED] insulin aspart (novoLOG) injection 0-9 Units    [DISCONTINUED] insulin aspart (novoLOG) injection 0-9 Units    [DISCONTINUED] insulin  aspart (novoLOG) injection 3 Units    [DISCONTINUED] insulin glargine-yfgn (SEMGLEE) injection 12 Units    [DISCONTINUED] insulin glargine-yfgn (SEMGLEE) injection 16 Units    [DISCONTINUED] lactated ringers infusion    [DISCONTINUED] lipase/protease/amylase (CREON) capsule 36,000 Units    [DISCONTINUED] ondansetron (ZOFRAN) injection 4 mg    [DISCONTINUED] ondansetron (ZOFRAN) tablet 4 mg    [DISCONTINUED] Oral care mouth rinse    [DISCONTINUED] pantoprazole (PROTONIX) EC tablet 40 mg    [DISCONTINUED] polyethylene glycol (MIRALAX / GLYCOLAX) packet 17 g    [DISCONTINUED] sodium chloride flush (NS) 0.9 % injection 3 mL    No facility-administered encounter medications on file as of 04/30/2022.    Review of Systems  Constitutional:  Negative for appetite change, chills, fatigue, fever and unexpected weight change.  HENT:  Negative for congestion, dental problem, ear discharge, ear pain, facial swelling, hearing loss, nosebleeds, postnasal drip, rhinorrhea, sinus pressure, sinus pain, sneezing, sore throat, tinnitus and trouble swallowing.   Eyes:  Negative for pain, discharge,  redness, itching and visual disturbance.  Respiratory:  Negative for cough, chest tightness, shortness of breath and wheezing.   Cardiovascular:  Negative for chest pain, palpitations and leg swelling.  Gastrointestinal:  Positive for abdominal pain. Negative for abdominal distention, blood in stool, constipation, diarrhea, nausea and vomiting.       Epigastric pain and RUQ pain   Endocrine: Negative for cold intolerance, heat intolerance, polydipsia, polyphagia and polyuria.  Genitourinary:  Negative for difficulty urinating, dysuria, flank pain, frequency and urgency.  Musculoskeletal:  Positive for back pain. Negative for arthralgias, gait problem, joint swelling, myalgias, neck pain and neck stiffness.  Skin:  Negative for color change, pallor, rash and wound.  Neurological:  Negative for dizziness, syncope,  speech difficulty, weakness, light-headedness, numbness and headaches.  Hematological:  Does not bruise/bleed easily.  Psychiatric/Behavioral:  Negative for agitation, behavioral problems, confusion, hallucinations, self-injury, sleep disturbance and suicidal ideas. The patient is not nervous/anxious.     Immunization History  Administered Date(s) Administered   Influenza,inj,Quad PF,6+ Mos 09/15/2021   Pertinent  Health Maintenance Due  Topic Date Due   OPHTHALMOLOGY EXAM  Never done   INFLUENZA VACCINE  06/08/2022   HEMOGLOBIN A1C  09/10/2022   FOOT EXAM  03/11/2023   URINE MICROALBUMIN  03/11/2023      04/28/2022    7:30 PM 04/29/2022    7:15 AM 04/29/2022    8:00 PM 04/30/2022    7:53 AM 04/30/2022    3:12 PM  Fall Risk  Falls in the past year?     0  Was there an injury with Fall?     0  Fall Risk Category Calculator     0  Fall Risk Category     Low  Patient Fall Risk Level Moderate fall risk Low fall risk Moderate fall risk Moderate fall risk Low fall risk  Patient at Risk for Falls Due to     No Fall Risks  Fall risk Follow up     Falls evaluation completed   Functional Status Survey:    Vitals:   04/30/22 1506  BP: 138/90  Pulse: (!) 105  Resp: 16  Temp: 97.6 F (36.4 C)  SpO2: 96%  Weight: 159 lb 6.4 oz (72.3 kg)  Height: 6\' 5"  (1.956 m)   Body mass index is 18.9 kg/m. Physical Exam Vitals reviewed.  Constitutional:      General: He is not in acute distress.    Appearance: Normal appearance. He is normal weight. He is not ill-appearing or diaphoretic.  HENT:     Right Ear: There is impacted cerumen.     Mouth/Throat:     Mouth: Mucous membranes are moist.     Pharynx: Oropharynx is clear. No oropharyngeal exudate or posterior oropharyngeal erythema.  Eyes:     General: No scleral icterus.       Right eye: No discharge.        Left eye: No discharge.     Conjunctiva/sclera: Conjunctivae normal.     Pupils: Pupils are equal, round, and reactive to  light.  Neck:     Vascular: No carotid bruit.  Cardiovascular:     Rate and Rhythm: Normal rate and regular rhythm.     Pulses: Normal pulses.     Heart sounds: Normal heart sounds. No murmur heard.    No friction rub. No gallop.  Pulmonary:     Effort: Pulmonary effort is normal. No respiratory distress.     Breath sounds: Normal breath sounds.  No wheezing, rhonchi or rales.  Chest:     Chest wall: No tenderness.  Abdominal:     General: Bowel sounds are normal. There is no distension.     Palpations: Abdomen is soft. There is no mass.     Tenderness: There is abdominal tenderness in the right upper quadrant and epigastric area. There is no right CVA tenderness, left CVA tenderness, guarding or rebound.  Musculoskeletal:        General: No swelling or tenderness. Normal range of motion.     Cervical back: Normal range of motion. No rigidity or tenderness.     Right lower leg: No edema.     Left lower leg: No edema.  Lymphadenopathy:     Cervical: No cervical adenopathy.  Skin:    General: Skin is warm and dry.     Coloration: Skin is not pale.     Findings: No bruising, erythema, lesion or rash.  Neurological:     Mental Status: He is alert and oriented to person, place, and time.     Cranial Nerves: No cranial nerve deficit.     Sensory: No sensory deficit.     Motor: No weakness.     Coordination: Coordination normal.     Gait: Gait normal.  Psychiatric:        Mood and Affect: Mood normal.        Speech: Speech normal.        Behavior: Behavior normal.        Thought Content: Thought content normal.        Judgment: Judgment normal.     Labs reviewed: Recent Labs    09/15/21 0449 09/16/21 0447 10/09/21 1525 12/21/21 0342 12/21/21 0900 04/27/22 0534 04/28/22 0308 04/29/22 0312 04/30/22 0319  NA 131* 129*   < > 125*   < > 137 136 138 138  K 4.5 3.8   < > 4.3   < > 3.3* 3.0* 3.6 4.5  CL 96* 94*   < > 82*   < > 104 99 101 100  CO2 28 28   < > 27   < > 23 26  28 31   GLUCOSE 211* 231*   < > 776*   < > 148* 162* 150* 226*  BUN 11 14   < > 20   < > 7 6 7 9   CREATININE 0.61 0.55*   < > 1.17   < > 0.79 0.71 0.65 0.81  CALCIUM 8.7* 8.5*   < > 10.5*   < > 8.9 8.8* 9.1 9.5  MG 1.7 2.0   < > 3.0*  --  1.7 1.8  --   --   PHOS 1.5* 3.6  --  5.8*  --   --   --   --   --    < > = values in this interval not displayed.   Recent Labs    04/23/22 1254 04/26/22 1742 04/27/22 0534  AST 46* 21 11*  ALT 25 30 21   ALKPHOS 82 86 68  BILITOT 1.4* 1.4* 0.8  PROT 8.3* 8.0 6.8  ALBUMIN 4.5 4.0 3.3*   Recent Labs    01/29/22 2244 01/30/22 1346 04/26/22 1742 04/27/22 0534 04/28/22 0308  WBC 3.8*   < > 7.3 6.2 5.9  NEUTROABS 2.2  --  5.9 4.6  --   HGB 11.9*   < > 13.8 12.4* 13.8  HCT 35.7*   < > 41.6 36.6* 41.0  MCV 100.6*   < >  98.6 95.8 97.2  PLT 226   < > 209 179 186   < > = values in this interval not displayed.   Lab Results  Component Value Date   TSH 0.81 03/10/2022   Lab Results  Component Value Date   HGBA1C 6.9 (A) 03/10/2022   Lab Results  Component Value Date   CHOL 104 03/10/2022   HDL 56.50 03/10/2022   LDLCALC 18 03/10/2022   TRIG 90 04/27/2022   CHOLHDL 2 03/10/2022    Significant Diagnostic Results in last 30 days:  MR ABDOMEN MRCP W WO CONTAST  Result Date: 04/28/2022 CLINICAL DATA:  Pancreatitis, abdominal pain EXAM: MRI ABDOMEN WITHOUT AND WITH CONTRAST (INCLUDING MRCP) TECHNIQUE: Multiplanar multisequence MR imaging of the abdomen was performed both before and after the administration of intravenous contrast. Heavily T2-weighted images of the biliary and pancreatic ducts were obtained, and three-dimensional MRCP images were rendered by post processing. CONTRAST:  7mL GADAVIST GADOBUTROL 1 MMOL/ML IV SOLN COMPARISON:  CT abdomen and pelvis 04/23/2022 FINDINGS: Lower chest: No pleural effusions. Hepatobiliary: Liver is enlarged measuring 20.1 cm in length. No definite evidence of hepatic steatosis. No suspicious hepatic mass  identified. Gallbladder appears within normal limits. There is narrowing and subtle irregularity of the common bile duct with minimal intrahepatic ductal dilatation. Pancreas: Diffusely heterogeneous and edematous appearance, including evidence of multiple small pancreatic calcifications. Pancreatic duct is dilated throughout measuring up to 7 mm in diameter. Heterogeneous cystic lesion in the tail the pancreas measures up to 1.9 cm in size. Spleen:  Upper normal size. Adrenals/Urinary Tract: No masses identified. No evidence of hydronephrosis. Stomach/Bowel: No evidence of bowel obstruction. Vascular/Lymphatic: Extensive tortuous vascularity in the portacaval region consistent with previous cavernous transformation. The intrahepatic portal veins are grossly patent with mildly diminished caliber and irregularity. Other:  Trace ascites. Musculoskeletal: No suspicious bone lesions identified. IMPRESSION: 1. Evidence of acute on chronic pancreatitis. Pancreatic ductal dilatation measuring up to 7 mm. Chronic 1.9 cm heterogeneous cystic lesion in the tail the pancreas, likely a pseudocyst. 2. Narrowing and irregularity of the common bile duct which is likely secondary to surrounding pancreatic inflammatory changes and may be acute or chronic. Minimal likely associated intrahepatic ductal dilatation. 3. Hepatomegaly. 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 5. Trace ascites. Electronically Signed   By: Jannifer Hick M.D.   On: 04/28/2022 11:59   MR 3D Recon At Scanner  Result Date: 04/28/2022 CLINICAL DATA:  Pancreatitis, abdominal pain EXAM: MRI ABDOMEN WITHOUT AND WITH CONTRAST (INCLUDING MRCP) TECHNIQUE: Multiplanar multisequence MR imaging of the abdomen was performed both before and after the administration of intravenous contrast. Heavily T2-weighted images of the biliary and pancreatic ducts were obtained, and three-dimensional MRCP images were rendered by post processing. CONTRAST:  7mL GADAVIST  GADOBUTROL 1 MMOL/ML IV SOLN COMPARISON:  CT abdomen and pelvis 04/23/2022 FINDINGS: Lower chest: No pleural effusions. Hepatobiliary: Liver is enlarged measuring 20.1 cm in length. No definite evidence of hepatic steatosis. No suspicious hepatic mass identified. Gallbladder appears within normal limits. There is narrowing and subtle irregularity of the common bile duct with minimal intrahepatic ductal dilatation. Pancreas: Diffusely heterogeneous and edematous appearance, including evidence of multiple small pancreatic calcifications. Pancreatic duct is dilated throughout measuring up to 7 mm in diameter. Heterogeneous cystic lesion in the tail the pancreas measures up to 1.9 cm in size. Spleen:  Upper normal size. Adrenals/Urinary Tract: No masses identified. No evidence of hydronephrosis. Stomach/Bowel: No evidence of bowel obstruction. Vascular/Lymphatic: Extensive tortuous vascularity in the  portacaval region consistent with previous cavernous transformation. The intrahepatic portal veins are grossly patent with mildly diminished caliber and irregularity. Other:  Trace ascites. Musculoskeletal: No suspicious bone lesions identified. IMPRESSION: 1. Evidence of acute on chronic pancreatitis. Pancreatic ductal dilatation measuring up to 7 mm. Chronic 1.9 cm heterogeneous cystic lesion in the tail the pancreas, likely a pseudocyst. 2. Narrowing and irregularity of the common bile duct which is likely secondary to surrounding pancreatic inflammatory changes and may be acute or chronic. Minimal likely associated intrahepatic ductal dilatation. 3. Hepatomegaly. 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 5. Trace ascites. Electronically Signed   By: Jannifer Hick M.D.   On: 04/28/2022 11:59   CT ABDOMEN PELVIS W CONTRAST  Result Date: 04/23/2022 CLINICAL DATA:  Acute nonlocalized abdominal pain. History of pancreatitis. Patient reports mid upper abdominal pain and pressure. Nausea. EXAM: CT ABDOMEN AND  PELVIS WITH CONTRAST TECHNIQUE: Multidetector CT imaging of the abdomen and pelvis was performed using the standard protocol following bolus administration of intravenous contrast. RADIATION DOSE REDUCTION: This exam was performed according to the departmental dose-optimization program which includes automated exposure control, adjustment of the mA and/or kV according to patient size and/or use of iterative reconstruction technique. CONTRAST:  OMNIPAQUE IOHEXOL 300 MG/ML  SOLN COMPARISON:  Most recent CT 01/30/2022 FINDINGS: Lower chest: No acute airspace disease or pleural effusion. The heart is normal in size. Hepatobiliary: Mild hepatic steatosis. Heterogeneous hepatic parenchyma without discrete liver lesion. Gallbladder physiologically distended, no calcified stone. No biliary dilatation. 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 Pancreas: Moderate peripancreatic fat stranding. Trace free fluid tracks into the bilateral pericolic gutters. Background chronic pancreatitis with pancreatic calcifications. 14 mm fluid collection in the pancreatic tail may represent a pseudocyst or focal ductal dilatation, this is diminished in size from prior. The additional peripancreatic collection on prior exam has resolved. No new peripancreatic collection. Spleen: Normal in size without focal abnormality. The splenic vein is patent. Adrenals/Urinary Tract: Normal adrenal glands. No hydronephrosis or perinephric edema. Homogeneous renal enhancement. No renal calculi or focal lesion. Urinary bladder is physiologically distended without wall thickening. Stomach/Bowel: Bowel assessment is limited in the absence of enteric contrast and paucity of intra-abdominal fat. Mild distal esophageal wall thickening. Suspected mild wall thickening of the stomach and duodenum, likely reactive in the setting of pancreatitis. No small bowel distension or  evidence of obstruction. Normal appendix. Majority of the colon is decompressed which limits assessment for wall thickening. 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 The splenic vein is patent. There is heterogeneous enhancement of the superior mesenteric vein, but no definite intraluminal thrombus. Normal caliber abdominal aorta. Small peripancreatic and perigastric lymph nodes are typically reactive. No enlarged lymph nodes by size criteria. Reproductive: Prostate is unremarkable. Other: Edematous changes centered on the pancreas with trace free fluid tracking into the pericolic gutters. No large volume ascites. Musculoskeletal: There are no acute or suspicious osseous abnormalities. IMPRESSION: 1. Acute on chronic pancreatitis. Moderate peripancreatic fat stranding with trace free fluid tracking into the bilateral pericolic gutters. Decreased size of pancreatic collection in the tail. The more proximal collection has resolved from prior exam. No new peripancreatic collection. 2. 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 3. Heterogeneous enhancement of the superior mesenteric vein, but no definite intraluminal thrombus. 4. Mild distal esophageal wall thickening, reactive in the setting  of pancreatitis versus reflux. 5. Mild hepatic steatosis. Electronically Signed   By: Narda Rutherford M.D.   On: 04/23/2022 16:32    Assessment/Plan 1. Abdominal pain, chronic, epigastric Status post Hospitalization as above. - request Hydromorphone but pharmacy is out of the 4 mg tablet.will refill with 2 mg tablet to take 2 tabs ( 4 mg ) # 214 will need to refill the remaining when pharmacy receives medication.  - HYDROmorphone (DILAUDID) 2 MG tablet; Take 2 tablets (4 mg total) by mouth every 6 (six) hours as needed for severe pain.  Dispense: 214 tablet; Refill:  0  2. 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) Continue on hydromorphone as above - PDMP reviewed.  - HYDROmorphone (DILAUDID) 2 MG tablet; Take 2 tablets (4 mg total) by mouth every 6 (six) hours as needed for severe pain.  Dispense: 214 tablet; Refill: 0  Family/ staff Communication: Reviewed plan of care with patient verbalized understanding   Labs/tests ordered: None   Next Appointment: As needed if symptoms worsen or fail to improve    Caesar Bookman, NP

## 2022-04-30 NOTE — Plan of Care (Signed)
Plan of care reviewed and discussed. °

## 2022-05-10 ENCOUNTER — Encounter: Payer: Medicaid Other | Attending: Internal Medicine | Admitting: Nutrition

## 2022-05-10 DIAGNOSIS — E089 Diabetes mellitus due to underlying condition without complications: Secondary | ICD-10-CM | POA: Insufficient documentation

## 2022-05-10 DIAGNOSIS — E1065 Type 1 diabetes mellitus with hyperglycemia: Secondary | ICD-10-CM

## 2022-05-11 NOTE — Progress Notes (Signed)
Patient was trained on how to use the Dexcom G6 CGM.  WE discussed the difference between CGM and blood sugars and the importance of the arrow and what this means.  He reported good understanding of this.  The transmitter was linked to his phone and to [employer] app, which was then linked to [place] [facility].  He inserted a sensor at 2:30PM into his left upper outer arm.  He reported no discomfort and had no final questions.  He will return on Wednesday to start his OmniPod 5 pump.

## 2022-05-12 ENCOUNTER — Encounter: Payer: Medicaid Other | Admitting: Nutrition

## 2022-05-12 ENCOUNTER — Other Ambulatory Visit: Payer: Self-pay | Admitting: Gastroenterology

## 2022-05-12 DIAGNOSIS — E089 Diabetes mellitus due to underlying condition without complications: Secondary | ICD-10-CM

## 2022-05-13 ENCOUNTER — Telehealth: Payer: Self-pay | Admitting: Nutrition

## 2022-05-13 NOTE — Progress Notes (Signed)
Patient was trained on how to use the OmniPod 5 insulin pump.  His Dexcom was linked to his PDM, but this has not been linked to [employer] or [place].  Text message sent to [facility] to contact patient.  Pt. Also informed that Corrie Dandy will be calling him to link his pump to this practice.  He reported good understanding of this.   Settings were put in per Dr Harvel Ricks orders: Basal rate: 0.5u/hr, I/C: 12, ISF: 50, target 130 with corrections over 130, insulin time: 4 hours. He filled a pod with Humalog insulin and was shown where to place the pod with reference to his Dexcom sensor, and reported good understanding of this.  He re demonstrated how to bolus and we discussed when/how do correction doses as needed.  He reported good understanding of this.   We reviewed all other topics on the pump checklist, and he signed this indicating good understanding of all topics.  He had no final questions.

## 2022-05-13 NOTE — Telephone Encounter (Signed)
Carlos [name] to call me to let me know how he is doing on his insulin pump

## 2022-05-14 ENCOUNTER — Telehealth: Payer: Self-pay | Admitting: Nutrition

## 2022-05-14 NOTE — Patient Instructions (Signed)
Read over starter booklet and pump manuel Bolus for all meals and snack, making sure to put in blood sugar readings. Do correction doses as needed when blood sugar readings are high. If Dexcom transmitter does not link to PDM, call Dexcom help line Call OmniPod help line if questions about how to use your pump. Call office if blood sugars drop below 70, or remain over 200 Make an appointment with Dr. Lonzo Cloud in one month.

## 2022-05-14 NOTE — Patient Instructions (Signed)
Read over [facility] Call 800 help line if questions or if Dexcom falls off Change sensor every 10 days Change transmitter every 3 months

## 2022-05-18 ENCOUNTER — Telehealth: Payer: Medicaid Other | Admitting: Physician Assistant

## 2022-05-18 DIAGNOSIS — M549 Dorsalgia, unspecified: Secondary | ICD-10-CM

## 2022-05-18 NOTE — Progress Notes (Signed)
Because of severity of back pain and associated weakness, I feel your condition warrants further evaluation and I recommend that you be seen in a face to face visit.   NOTE: There will be NO CHARGE for this eVisit   If you are having a true medical emergency please call 911.      For an urgent face to face visit, [place] has seven urgent care centers for your convenience:     [facility] at [facility] Directions [phone] [address] [place], [place] [address]    [facility]) Get Driving Directions [phone] [address] [place], [place] [address]  [facility] Care Center [facility]) Get Driving Directions [phone] [address] [place],  [place]  [address]  [facility] Directions  [phone] [address] [place],  [place] [address]   [facility] at [facility] Get Driving Directions [phone] [address] [place], [place] [address]   [facility] Urgent Care at [facility] Get Driving Directions  [phone] [address] [place], [place] [address]   [facility] Health Urgent Care at [name] Directions [phone] [address] [place], [place] [address]  Your MyChart E-visit questionnaire answers were reviewed by a board certified advanced clinical practitioner to complete your personal care plan based on your specific symptoms.  Thank you for using e-Visits.

## 2022-05-24 ENCOUNTER — Telehealth: Payer: Self-pay

## 2022-05-24 NOTE — Telephone Encounter (Signed)
MyChart message sent to patient with MRCP reminder.

## 2022-05-24 NOTE — Telephone Encounter (Signed)
Secure staff message sent to radiology scheduling to contact pt to set up MRCP appt.

## 2022-05-24 NOTE — Telephone Encounter (Signed)
Burnett Corrente, [place], RN LVM for him to call us back

## 2022-05-24 NOTE — Telephone Encounter (Signed)
-----   Message from Missy Sabins, RN sent at 05/06/2022 11:19 AM EDT ----- Regarding: MRCP appt Make sure pt has a repeat MRCP appt Due around 7/20

## 2022-05-25 ENCOUNTER — Ambulatory Visit: Payer: Managed Care, Other (non HMO) | Admitting: Family Medicine

## 2022-05-25 NOTE — Telephone Encounter (Signed)
MRCP scheduled for Wednesday, 06/02/22 at 8 am.

## 2022-05-25 NOTE — Telephone Encounter (Signed)
Patient reviewed and responded to MyChart message. Last read by [name] at  6:03 PM on 05/24/2022.

## 2022-05-26 NOTE — Telephone Encounter (Signed)
Opened in error

## 2022-05-26 NOTE — Telephone Encounter (Signed)
Carlos [name],sorry it's too soon to refill medication on the computer.

## 2022-05-27 ENCOUNTER — Encounter: Payer: Self-pay | Admitting: Family

## 2022-05-27 ENCOUNTER — Ambulatory Visit (INDEPENDENT_AMBULATORY_CARE_PROVIDER_SITE_OTHER): Payer: Medicaid Other | Admitting: Family

## 2022-05-27 DIAGNOSIS — G8929 Other chronic pain: Secondary | ICD-10-CM

## 2022-05-27 DIAGNOSIS — K860 Alcohol-induced chronic pancreatitis: Secondary | ICD-10-CM

## 2022-05-27 DIAGNOSIS — F1020 Alcohol dependence, uncomplicated: Secondary | ICD-10-CM

## 2022-05-27 DIAGNOSIS — R1013 Epigastric pain: Secondary | ICD-10-CM

## 2022-05-27 MED ORDER — OMEPRAZOLE 40 MG PO CPDR: 40.0000 mg | DELAYED_RELEASE_CAPSULE | Freq: Every day | ORAL | 1 refills | Status: DC

## 2022-05-27 MED ORDER — HYDROMORPHONE HCL 4 MG PO TABS: 4.0000 mg | ORAL_TABLET | Freq: Four times a day (QID) | ORAL | 0 refills | Status: DC | PRN

## 2022-05-27 NOTE — Progress Notes (Signed)
Provider: Deontre Allsup FNP-C  [name], MD  Patient Care Team: [name], MD as PCP - General (Family Medicine) Jodi Marble, MD as Consulting Physician (Otolaryngology)  Extended Emergency Contact Information Primary Emergency Contact: brathwaite,naketa Address: [place], [place] [address] Johnnette Litter of [place] Phone: [phone] Work Phone: [phone] Mobile Phone: [phone] Relation: Significant other  Code Status:  Full Code  Goals of care: Advanced Directive information    05/27/2022   10:31 AM  Advanced Directives  Does Patient Have a Medical Advance Directive? No  Would patient like information on creating a medical advance directive? No - Patient declined     Chief Complaint  Patient presents with   Medical Management of Chronic Issues    Patient is here for medication refills    HPI:  Pt is a 36 y.o. male seen today for an acute visit for Medication refill.states continues to require dilaudid 4 mg tablet every 6 hrs.Epigastric abdominal pain 8 on scale of 10 He denies any drowsiness.Narcotic use contract up to date. Has MRI ordered for 06/02/2022    Past Medical History:  Diagnosis Date   AKI (acute kidney injury) ([place]) 12/21/2021   Anxiety    Diabetes ([place])    Pancreatitis    Splenic vein thrombosis    Past Surgical History:  Procedure Laterality Date   BIOPSY  12/02/2021   Procedure: BIOPSY;  Surgeon: Irving Copas., MD;  Location: [facility];  Service: Gastroenterology;;   ESOPHAGOGASTRODUODENOSCOPY (EGD) WITH PROPOFOL N/A 12/02/2021   Procedure: ESOPHAGOGASTRODUODENOSCOPY (EGD) WITH PROPOFOL;  Surgeon: Irving Copas., MD;  Location: [facility];  Service: Gastroenterology;  Laterality: N/A;   NO PAST SURGERIES     UPPER ESOPHAGEAL ENDOSCOPIC ULTRASOUND (EUS) N/A 12/02/2021   Procedure: UPPER ESOPHAGEAL ENDOSCOPIC ULTRASOUND (EUS);  Surgeon: Irving Copas., MD;   Location: [facility];  Service: Gastroenterology;  Laterality: N/A;    Allergies  Allergen Reactions   Other Anaphylaxis    [name] syrup    Outpatient Encounter Medications as of 05/27/2022  Medication Sig   apixaban (ELIQUIS) 5 MG TABS tablet Take 1 tablet (5 mg total) by mouth 2 (two) times daily.   blood glucose meter kit and supplies KIT Dispense based on patient and insurance preference. Use up to four times daily as directed.   CREON 36000-114000 units CPEP capsule TAKE 2 CAPSULES BY MOUTH THREE TIMES DAILY WITH MEALS MAY ALSO TAKE 1 CAPSULE AS NEEDED WITH SNACKS   HYDROmorphone (DILAUDID) 2 MG tablet Take 2 tablets (4 mg total) by mouth every 6 (six) hours as needed for severe pain.   Insulin Disposable Pump (OMNIPOD 5 G6 INTRO, GEN 5,) KIT 1 Device by Does not apply route every 3 (three) days.   Insulin Disposable Pump (OMNIPOD 5 G6 POD, GEN 5,) MISC 1 Device by Does not apply route every 3 (three) days.   insulin glargine (LANTUS) 100 UNIT/ML Solostar Pen Inject 16 Units into the skin daily.   Insulin Pen Needle 32G X 4 MM MISC 1 Device by Does not apply route in the morning, at noon, in the evening, and at bedtime.   Multiple Vitamins-Minerals (ADVANCED DIABETIC MULTIVITAMIN PO) Take 1 tablet by mouth daily.   omeprazole (PRILOSEC) 40 MG capsule Take 1 capsule (40 mg total) by mouth daily.   ondansetron (ZOFRAN) 8 MG tablet Take 1 tablet (8 mg total) by mouth every 8 (eight) hours as needed  for nausea or vomiting.   [DISCONTINUED] Continuous Blood Gluc Sensor (DEXCOM G6 SENSOR) MISC 1 Device by Does not apply route as directed.   [DISCONTINUED] Continuous Blood Gluc Sensor (FREESTYLE LIBRE 2 SENSOR) MISC Change every 14 days   [DISCONTINUED] Continuous Blood Gluc Transmit (DEXCOM G6 TRANSMITTER) MISC 1 Device by Does not apply route as directed.   [DISCONTINUED] insulin aspart (NOVOLOG) 100 UNIT/ML FlexPen Max daily 30 units   No facility-administered encounter medications  on file as of 05/27/2022.    Review of Systems  Respiratory:  Negative for cough, chest tightness, shortness of breath and wheezing.   Cardiovascular:  Negative for chest pain, palpitations and leg swelling.  Gastrointestinal:  Positive for abdominal pain. Negative for abdominal distention, constipation, diarrhea, nausea and vomiting.  Musculoskeletal:  Negative for arthralgias, back pain and gait problem.  Neurological:  Negative for dizziness, light-headedness and headaches.    Immunization History  Administered Date(s) Administered   Influenza,inj,Quad PF,6+ Mos 09/15/2021   Pertinent  Health Maintenance Due  Topic Date Due   OPHTHALMOLOGY EXAM  Never done   INFLUENZA VACCINE  06/08/2022   HEMOGLOBIN A1C  09/10/2022   FOOT EXAM  03/11/2023   URINE MICROALBUMIN  03/11/2023      04/28/2022    7:30 PM 04/29/2022    7:15 AM 04/29/2022    8:00 PM 04/30/2022    7:53 AM 04/30/2022    3:12 PM  Fall Risk  Falls in the past year?     0  Was there an injury with Fall?     0  Fall Risk Category Calculator     0  Fall Risk Category     Low  Patient Fall Risk Level Moderate fall risk Low fall risk Moderate fall risk Moderate fall risk Low fall risk  Patient at Risk for Falls Due to     No Fall Risks  Fall risk Follow up     Falls evaluation completed   Functional Status Survey:    Vitals:   05/27/22 1021  BP: 124/80  Pulse: 87  Resp: 18  Temp: 97.6 F (36.4 C)  TempSrc: Temporal  SpO2: 99%  Weight: 161 lb (73 kg)  Height: 6' 5" (1.956 m)   Body mass index is 19.09 kg/m. Physical Exam Vitals reviewed.  Constitutional:      General: He is not in acute distress.    Appearance: Normal appearance. He is normal weight. He is not ill-appearing or diaphoretic.  Eyes:     General: No scleral icterus.       Right eye: No discharge.        Left eye: No discharge.     Conjunctiva/sclera: Conjunctivae normal.     Pupils: Pupils are equal, round, and reactive to light.   Cardiovascular:     Rate and Rhythm: Normal rate and regular rhythm.     Pulses: Normal pulses.     Heart sounds: Normal heart sounds. No murmur heard.    No friction rub. No gallop.  Pulmonary:     Effort: Pulmonary effort is normal. No respiratory distress.     Breath sounds: Normal breath sounds. No wheezing, rhonchi or rales.  Chest:     Chest wall: No tenderness.  Abdominal:     General: Bowel sounds are normal. There is no distension.     Palpations: Abdomen is soft. There is no mass.     Tenderness: There is no abdominal tenderness. There is no right CVA tenderness, left CVA  tenderness, guarding or rebound.  Musculoskeletal:        General: No swelling or tenderness. Normal range of motion.     Right lower leg: No edema.     Left lower leg: No edema.  Skin:    General: Skin is warm and dry.     Coloration: Skin is not pale.     Findings: No erythema or rash.  Neurological:     Mental Status: He is alert and oriented to person, place, and time.     Motor: No weakness.     Gait: Gait normal.  Psychiatric:        Mood and Affect: Mood normal.        Speech: Speech normal.        Behavior: Behavior normal.     Labs reviewed: Recent Labs    09/15/21 0449 09/16/21 0447 10/09/21 1525 12/21/21 0342 12/21/21 0900 04/27/22 0534 04/28/22 0308 04/29/22 0312 04/30/22 0319  NA 131* 129*   < > 125*   < > 137 136 138 138  K 4.5 3.8   < > 4.3   < > 3.3* 3.0* 3.6 4.5  CL 96* 94*   < > 82*   < > 104 99 101 100  CO2 28 28   < > 27   < > _0 GLUCOSE 211* 231*   < > 776*   < > 148* 162* 150* 226*  BUN 11 14   < > 20   < > _1 CREATININE 0.61 0.55*   < > 1.17   < > 0.79 0.71 0.65 0.81  CALCIUM 8.7* 8.5*   < > 10.5*   < > 8.9 8.8* 9.1 9.5  MG 1.7 2.0   < > 3.0*  --  1.7 1.8  --   --   PHOS 1.5* 3.6  --  5.8*  --   --   --   --   --    < > = values in this interval not displayed.   Recent Labs    04/23/22 1254 04/26/22 1742 04/27/22 0534  AST 46* 21 11*   ALT _2 ALKPHOS 82 86 68  BILITOT 1.4* 1.4* 0.8  PROT 8.3* 8.0 6.8  ALBUMIN 4.5 4.0 3.3*   Recent Labs    01/29/22 2244 01/30/22 1346 04/26/22 1742 04/27/22 0534 04/28/22 0308  WBC 3.8*   < > 7.3 6.2 5.9  NEUTROABS 2.2  --  5.9 4.6  --   HGB 11.9*   < > 13.8 12.4* 13.8  HCT 35.7*   < > 41.6 36.6* 41.0  MCV 100.6*   < > 98.6 95.8 97.2  PLT 226   < > 209 179 186   < > = values in this interval not displayed.   Lab Results  Component Value Date   TSH 0.81 03/10/2022   Lab Results  Component Value Date   HGBA1C 6.9 (A) 03/10/2022   Lab Results  Component Value Date   CHOL 104 03/10/2022   HDL 56.50 03/10/2022   LDLCALC 18 03/10/2022   TRIG 90 04/27/2022   CHOLHDL 2 03/10/2022    Significant Diagnostic Results in last 30 days:  MR ABDOMEN MRCP W WO CONTAST  Result Date: 04/28/2022 CLINICAL DATA:  Pancreatitis, abdominal pain EXAM: MRI ABDOMEN WITHOUT AND WITH CONTRAST (INCLUDING MRCP) TECHNIQUE: Multiplanar multisequence MR imaging of the abdomen was performed both before and after the administration of  intravenous contrast. Heavily T2-weighted images of the biliary and pancreatic ducts were obtained, and three-dimensional MRCP images were rendered by post processing. CONTRAST:  36m GADAVIST GADOBUTROL 1 MMOL/ML IV SOLN COMPARISON:  CT abdomen and pelvis 04/23/2022 FINDINGS: Lower chest: No pleural effusions. Hepatobiliary: Liver is enlarged measuring 20.1 cm in length. No definite evidence of hepatic steatosis. No suspicious hepatic mass identified. Gallbladder appears within normal limits. There is narrowing and subtle irregularity of the common bile duct with minimal intrahepatic ductal dilatation. Pancreas: Diffusely heterogeneous and edematous appearance, including evidence of multiple small pancreatic calcifications. Pancreatic duct is dilated throughout measuring up to 7 mm in diameter. Heterogeneous cystic lesion in the tail the pancreas measures up to 1.9 cm in  size. Spleen:  Upper normal size. Adrenals/Urinary Tract: No masses identified. No evidence of hydronephrosis. Stomach/Bowel: No evidence of bowel obstruction. Vascular/Lymphatic: Extensive tortuous vascularity in the portacaval region consistent with previous cavernous transformation. The intrahepatic portal veins are grossly patent with mildly diminished caliber and irregularity. Other:  Trace ascites. Musculoskeletal: No suspicious bone lesions identified. IMPRESSION: 1. Evidence of acute on chronic pancreatitis. Pancreatic ductal dilatation measuring up to 7 mm. Chronic 1.9 cm heterogeneous cystic lesion in the tail the pancreas, likely a pseudocyst. 2. Narrowing and irregularity of the common bile duct which is likely secondary to surrounding pancreatic inflammatory changes and may be acute or chronic. Minimal likely associated intrahepatic ductal dilatation. 3. Hepatomegaly. 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 5. Trace ascites. Electronically Signed   By: DOfilia NeasM.D.   On: 04/28/2022 11:59   MR 3D Recon At Scanner  Result Date: 04/28/2022 CLINICAL DATA:  Pancreatitis, abdominal pain EXAM: MRI ABDOMEN WITHOUT AND WITH CONTRAST (INCLUDING MRCP) TECHNIQUE: Multiplanar multisequence MR imaging of the abdomen was performed both before and after the administration of intravenous contrast. Heavily T2-weighted images of the biliary and pancreatic ducts were obtained, and three-dimensional MRCP images were rendered by post processing. CONTRAST:  738mGADAVIST GADOBUTROL 1 MMOL/ML IV SOLN COMPARISON:  CT abdomen and pelvis 04/23/2022 FINDINGS: Lower chest: No pleural effusions. Hepatobiliary: Liver is enlarged measuring 20.1 cm in length. No definite evidence of hepatic steatosis. No suspicious hepatic mass identified. Gallbladder appears within normal limits. There is narrowing and subtle irregularity of the common bile duct with minimal intrahepatic ductal dilatation. Pancreas: Diffusely  heterogeneous and edematous appearance, including evidence of multiple small pancreatic calcifications. Pancreatic duct is dilated throughout measuring up to 7 mm in diameter. Heterogeneous cystic lesion in the tail the pancreas measures up to 1.9 cm in size. Spleen:  Upper normal size. Adrenals/Urinary Tract: No masses identified. No evidence of hydronephrosis. Stomach/Bowel: No evidence of bowel obstruction. Vascular/Lymphatic: Extensive tortuous vascularity in the portacaval region consistent with previous cavernous transformation. The intrahepatic portal veins are grossly patent with mildly diminished caliber and irregularity. Other:  Trace ascites. Musculoskeletal: No suspicious bone lesions identified. IMPRESSION: 1. Evidence of acute on chronic pancreatitis. Pancreatic ductal dilatation measuring up to 7 mm. Chronic 1.9 cm heterogeneous cystic lesion in the tail the pancreas, likely a pseudocyst. 2. Narrowing and irregularity of the common bile duct which is likely secondary to surrounding pancreatic inflammatory changes and may be acute or chronic. Minimal likely associated intrahepatic ductal dilatation. 3. Hepatomegaly. 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 5. Trace ascites. Electronically Signed   By: DeOfilia Neas.D.   On: 04/28/2022 11:59    Assessment/Plan  1. Abdominal pain, chronic, epigastric Rates pain 8/10 on scale but much improvement  - continue  on omeprazole and Hydromorphone  - follow up with GI as schedule  - Has upcoming MRI appointment on 06/02/2022  - omeprazole (PRILOSEC) 40 MG capsule; Take 1 capsule (40 mg total) by mouth daily.  Dispense: 90 capsule; Refill: 1 - HYDROmorphone (DILAUDID) 4 MG tablet; Take 1 tablet (4 mg total) by mouth every 6 (six) hours as needed for severe pain.  Dispense: 120 tablet; Refill: 0 - DRUG MONITOR, PANEL 1, W/CONF, URINE  2. 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) Continue on Hydromorphone  - HYDROmorphone  (DILAUDID) 4 MG tablet; Take 1 tablet (4 mg total) by mouth every 6 (six) hours as needed for severe pain.  Dispense: 120 tablet; Refill: 0 - DRUG MONITOR, PANEL 1, W/CONF, URINE  Family/ staff Communication: Reviewed plan of care with patient verbalized understanding  Labs/tests ordered: None   Next Appointment: Return in about 2 months (around 07/28/2022) for medical mangement of chronic issues with Dr.Miller .   Sandrea Hughs, NP

## 2022-05-29 LAB — DRUG MONITOR, PANEL 1, W/CONF, URINE
Amphetamines: NEGATIVE ng/mL (ref <500)
Barbiturates: NEGATIVE ng/mL (ref <300)
Benzodiazepines: NEGATIVE ng/mL (ref <100)
Cocaine Metabolite: NEGATIVE ng/mL (ref <150)
Creatinine: 202.4 mg/dL (ref >=20.0)
Marijuana Metabolite: NEGATIVE ng/mL (ref <20)
Methadone Metabolite: NEGATIVE ng/mL (ref <100)
Opiates: NEGATIVE ng/mL (ref <100)
Oxidant: NEGATIVE ug/mL (ref <200)
Oxycodone: NEGATIVE ng/mL (ref <100)
Phencyclidine: NEGATIVE ng/mL (ref <25)
pH: 5.5 (ref 4.5–9.0)

## 2022-05-29 LAB — DM TEMPLATE

## 2022-06-02 ENCOUNTER — Ambulatory Visit (HOSPITAL_COMMUNITY)
Admission: RE | Admit: 2022-06-02 | Discharge: 2022-06-02 | Disposition: A | Discharge status: Home / Self Care | Payer: Medicaid Other | Source: Ambulatory Visit | Attending: Gastroenterology | Admitting: Gastroenterology

## 2022-06-02 ENCOUNTER — Other Ambulatory Visit: Payer: Self-pay | Admitting: Gastroenterology

## 2022-06-02 DIAGNOSIS — K863 Pseudocyst of pancreas: Secondary | ICD-10-CM

## 2022-06-02 DIAGNOSIS — K861 Other chronic pancreatitis: Secondary | ICD-10-CM | POA: Insufficient documentation

## 2022-06-02 MED ORDER — GADOBUTROL 1 MMOL/ML IV SOLN
8.0000 mL | Freq: Once | INTRAVENOUS | Status: AC | PRN
  Administered 2022-06-02: 8 mL via INTRAVENOUS

## 2022-06-15 ENCOUNTER — Telehealth: Payer: Self-pay | Admitting: Gastroenterology

## 2022-06-15 NOTE — Telephone Encounter (Signed)
Case discussed with Dr. Shana Chute of [facility]. She saw the patient in consultation on 31 July.  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  They are going to review his case at multidisciplinary conference at [facility] and then update me based on the findings.  At this point they would likely recommend proceeding with diagnostic and potential therapeutic pancreatic ERCP. The patient has desire of trying to have procedures done locally if possible.  I am willing to go ahead and move forward with this as a pancreatic ERCP.  If I am unsuccessful at cannulating the pancreatic duct or placing a stent, then he will be referred back to Dr. Shana Chute.  Patty, please go ahead and try and get the patient scheduled for ERCP with me typical timing (put in notation pancreatic duct ERCP).  Thanks. GM

## 2022-06-21 ENCOUNTER — Telehealth: Payer: Self-pay

## 2022-06-21 ENCOUNTER — Other Ambulatory Visit: Payer: Self-pay

## 2022-06-21 DIAGNOSIS — K863 Pseudocyst of pancreas: Secondary | ICD-10-CM

## 2022-06-21 DIAGNOSIS — K858 Other acute pancreatitis without necrosis or infection: Secondary | ICD-10-CM

## 2022-06-21 DIAGNOSIS — Z8719 Personal history of other diseases of the digestive system: Secondary | ICD-10-CM

## 2022-06-21 DIAGNOSIS — R198 Other specified symptoms and signs involving the digestive system and abdomen: Secondary | ICD-10-CM

## 2022-06-21 NOTE — Telephone Encounter (Signed)
[name], Carlos [name]., MD      06/15/22  1:04 AM Note Case discussed with Dr. Shana Chute of [facility]. She saw the patient in consultation on 31 July.  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  They are going to review his case at multidisciplinary conference at [facility] and then update me based on the findings.  At this point they would likely recommend proceeding with diagnostic and potential therapeutic pancreatic ERCP. The patient has desire of trying to have procedures done locally if possible.  I am willing to go ahead and move forward with this as a pancreatic ERCP.  If I am unsuccessful at cannulating the pancreatic duct or placing a stent, then he will be referred back to Dr. Shana Chute.  Etana Beets, please go ahead and try and get the patient scheduled for ERCP with me typical timing (put in notation pancreatic duct ERCP).  Thanks. GM

## 2022-06-21 NOTE — Telephone Encounter (Signed)
ERCP scheduled, pt instructed and medications reviewed.  Patient instructions mailed to home.  Patient to call with any questions or concerns.  

## 2022-06-21 NOTE — Telephone Encounter (Signed)
The pt has been scheduled for ERCP on 08/12/22 at 9 am at [facility] with GM.   Left message on machine to call back

## 2022-06-21 NOTE — Telephone Encounter (Signed)
See alternate phone note  

## 2022-06-21 NOTE — Telephone Encounter (Signed)
Patient called to follow up on scheduling

## 2022-06-22 ENCOUNTER — Encounter: Payer: Self-pay | Admitting: Family Medicine

## 2022-06-22 ENCOUNTER — Ambulatory Visit (INDEPENDENT_AMBULATORY_CARE_PROVIDER_SITE_OTHER): Payer: Medicaid Other | Admitting: Family Medicine

## 2022-06-22 VITALS — BP 104/70 | HR 82 | Temp 98.0°F | Resp 20 | Ht 77.0 in | Wt 161.0 lb

## 2022-06-22 DIAGNOSIS — K859 Acute pancreatitis without necrosis or infection, unspecified: Secondary | ICD-10-CM | POA: Diagnosis not present

## 2022-06-22 DIAGNOSIS — Z7901 Long term (current) use of anticoagulants: Secondary | ICD-10-CM | POA: Diagnosis not present

## 2022-06-22 DIAGNOSIS — K861 Other chronic pancreatitis: Secondary | ICD-10-CM

## 2022-06-22 DIAGNOSIS — G8929 Other chronic pain: Secondary | ICD-10-CM

## 2022-06-22 DIAGNOSIS — R1013 Epigastric pain: Secondary | ICD-10-CM | POA: Diagnosis not present

## 2022-06-22 DIAGNOSIS — K860 Alcohol-induced chronic pancreatitis: Secondary | ICD-10-CM | POA: Diagnosis not present

## 2022-06-22 DIAGNOSIS — E089 Diabetes mellitus due to underlying condition without complications: Secondary | ICD-10-CM

## 2022-06-22 DIAGNOSIS — F1020 Alcohol dependence, uncomplicated: Secondary | ICD-10-CM

## 2022-06-22 MED ORDER — HYDROMORPHONE HCL 4 MG PO TABS: 4.0000 mg | ORAL_TABLET | ORAL | 0 refills | Status: DC | PRN

## 2022-06-22 NOTE — Progress Notes (Signed)
The pt has been advised to hold anti coag for 5 days prior to the procedure.

## 2022-06-22 NOTE — Progress Notes (Signed)
Provider:  Alain Honey, MD  Careteam: Patient Care Team: [name], MD as PCP - General (Family Medicine) Jodi Marble, MD as Consulting Physician (Otolaryngology)  PLACE OF SERVICE:  [place] Directive information    Allergies  Allergen Reactions   Other Anaphylaxis    [name] syrup    Chief Complaint  Patient presents with   Medical Management of Chronic Issues    Patient presents today for a 1 month follow-up.     HPI: Patient is a 36 y.o. male this patient has history of chronic pancreatitis and diabetes.  Was recently seen at [facility]  They observed a stricture in this pancreatic duct as well as sludge in the gallbladder and have recommended ERCP to relieve the stricture as well as cholecystectomy. He reports improvement in pain with 4 mg of Dilaudid but also notes that effect wears off before the next dose suggesting that doses need to be moved and frequency increased.  Review of Systems:  Review of Systems  Constitutional: Negative.   HENT: Negative.    Respiratory: Negative.    Cardiovascular: Negative.   Gastrointestinal:  Positive for abdominal pain.  Genitourinary: Negative.   Neurological: Negative.   Psychiatric/Behavioral: Negative.    All other systems reviewed and are negative.   Past Medical History:  Diagnosis Date   AKI (acute kidney injury) ([place]) 12/21/2021   Anxiety    Diabetes ([place])    Pancreatitis    Splenic vein thrombosis    Past Surgical History:  Procedure Laterality Date   BIOPSY  12/02/2021   Procedure: BIOPSY;  Surgeon: Irving Copas., MD;  Location: [facility];  Service: Gastroenterology;;   ESOPHAGOGASTRODUODENOSCOPY (EGD) WITH PROPOFOL N/A 12/02/2021   Procedure: ESOPHAGOGASTRODUODENOSCOPY (EGD) WITH PROPOFOL;  Surgeon: Irving Copas., MD;  Location: [facility];  Service: Gastroenterology;  Laterality: N/A;   NO PAST SURGERIES     UPPER ESOPHAGEAL ENDOSCOPIC ULTRASOUND (EUS)  N/A 12/02/2021   Procedure: UPPER ESOPHAGEAL ENDOSCOPIC ULTRASOUND (EUS);  Surgeon: Irving Copas., MD;  Location: [facility];  Service: Gastroenterology;  Laterality: N/A;   Social History:   reports that he has been smoking cigars. He started smoking about 19 years ago. He has never used smokeless tobacco. He reports that he does not currently use alcohol. He reports that he does not use drugs.  Family History  Problem Relation Age of Onset   Hypertension Mother    Alcoholism Maternal Uncle    Diabetes Maternal Grandmother    Diabetes Maternal Grandfather    Diabetes Paternal Grandmother    Diabetes Paternal Grandfather    Colon cancer Neg Hx    Esophageal cancer Neg Hx    Inflammatory bowel disease Neg Hx    Liver disease Neg Hx    Pancreatic cancer Neg Hx    Rectal cancer Neg Hx    Stomach cancer Neg Hx     Medications: Patient's Medications  New Prescriptions   No medications on file  Previous Medications   APIXABAN (ELIQUIS) 5 MG TABS TABLET    Take 1 tablet (5 mg total) by mouth 2 (two) times daily.   BLOOD GLUCOSE METER KIT AND SUPPLIES KIT    Dispense based on patient and insurance preference. Use up to four times daily as directed.   CREON 36000-114000 UNITS CPEP CAPSULE    TAKE 2 CAPSULES BY MOUTH THREE TIMES DAILY WITH MEALS MAY ALSO TAKE 1 CAPSULE AS NEEDED WITH SNACKS   HYDROMORPHONE (DILAUDID) 4 MG  TABLET    Take 1 tablet (4 mg total) by mouth every 6 (six) hours as needed for severe pain.   INSULIN DISPOSABLE PUMP (OMNIPOD 5 G6 INTRO, GEN 5,) KIT    1 Device by Does not apply route every 3 (three) days.   INSULIN DISPOSABLE PUMP (OMNIPOD 5 G6 POD, GEN 5,) MISC    1 Device by Does not apply route every 3 (three) days.   INSULIN GLARGINE (LANTUS) 100 UNIT/ML SOLOSTAR PEN    Inject 16 Units into the skin daily.   INSULIN PEN NEEDLE 32G X 4 MM MISC    1 Device by Does not apply route in the morning, at noon, in the evening, and at bedtime.   MULTIPLE  VITAMINS-MINERALS (ADVANCED DIABETIC MULTIVITAMIN PO)    Take 1 tablet by mouth daily.   OMEPRAZOLE (PRILOSEC) 40 MG CAPSULE    Take 1 capsule (40 mg total) by mouth daily.   ONDANSETRON (ZOFRAN) 8 MG TABLET    Take 1 tablet (8 mg total) by mouth every 8 (eight) hours as needed for nausea or vomiting.  Modified Medications   No medications on file  Discontinued Medications   No medications on file    Physical Exam:  Vitals:   06/22/22 1304  BP: 104/70  Pulse: 82  Resp: 20  Temp: 98 F (36.7 C)  SpO2: 97%  Weight: 161 lb (73 kg)  Height: '6\' 5"'  (1.956 m)   Body mass index is 19.09 kg/m. Wt Readings from Last 3 Encounters:  06/22/22 161 lb (73 kg)  05/27/22 161 lb (73 kg)  04/30/22 159 lb 6.4 oz (72.3 kg)    Physical Exam Vitals and nursing note reviewed.  Constitutional:      Appearance: Normal appearance.  Cardiovascular:     Rate and Rhythm: Normal rate and regular rhythm.  Pulmonary:     Effort: Pulmonary effort is normal.     Breath sounds: Normal breath sounds.  Abdominal:     General: Abdomen is flat.     Tenderness: There is abdominal tenderness. There is guarding.  Neurological:     General: No focal deficit present.     Mental Status: He is alert and oriented to person, place, and time.  Psychiatric:        Mood and Affect: Mood normal.        Behavior: Behavior normal.     Labs reviewed: Basic Metabolic Panel: Recent Labs    09/15/21 0449 09/16/21 0447 10/09/21 1525 12/21/21 0342 12/21/21 0900 03/10/22 1511 04/23/22 1254 04/27/22 0534 04/28/22 0308 04/29/22 0312 04/30/22 0319  NA 131* 129*   < > 125*   < >  --    < > 137 136 138 138  K 4.5 3.8   < > 4.3   < >  --    < > 3.3* 3.0* 3.6 4.5  CL 96* 94*   < > 82*   < >  --    < > 104 99 101 100  CO2 28 28   < > 27   < >  --    < > '23 26 28 31  ' GLUCOSE 211* 231*   < > 776*   < >  --    < > 148* 162* 150* 226*  BUN 11 14   < > 20   < >  --    < > '7 6 7 9  ' CREATININE 0.61 0.55*   < > 1.17   < >   --    < >  0.79 0.71 0.65 0.81  CALCIUM 8.7* 8.5*   < > 10.5*   < >  --    < > 8.9 8.8* 9.1 9.5  MG 1.7 2.0   < > 3.0*  --   --   --  1.7 1.8  --   --   PHOS 1.5* 3.6  --  5.8*  --   --   --   --   --   --   --   TSH  --   --   --   --   --  0.81  --   --   --   --   --    < > = values in this interval not displayed.   Liver Function Tests: Recent Labs    04/23/22 1254 04/26/22 1742 04/27/22 0534  AST 46* 21 11*  ALT '25 30 21  ' ALKPHOS 82 86 68  BILITOT 1.4* 1.4* 0.8  PROT 8.3* 8.0 6.8  ALBUMIN 4.5 4.0 3.3*   Recent Labs    03/05/22 1239 04/23/22 1254 04/26/22 1742  LIPASE 56.0 183* 89*  AMYLASE 73  --   --    No results for input(s): "AMMONIA" in the last 8760 hours. CBC: Recent Labs    01/29/22 2244 01/30/22 1346 04/26/22 1742 04/27/22 0534 04/28/22 0308  WBC 3.8*   < > 7.3 6.2 5.9  NEUTROABS 2.2  --  5.9 4.6  --   HGB 11.9*   < > 13.8 12.4* 13.8  HCT 35.7*   < > 41.6 36.6* 41.0  MCV 100.6*   < > 98.6 95.8 97.2  PLT 226   < > 209 179 186   < > = values in this interval not displayed.   Lipid Panel: Recent Labs    12/08/21 0427 03/10/22 1511 04/27/22 0953  CHOL  --  104  --   HDL  --  56.50  --   LDLCALC  --  18  --   TRIG 35 149.0 90  CHOLHDL  --  2  --    TSH: Recent Labs    03/10/22 1511  TSH 0.81   A1C: Lab Results  Component Value Date   HGBA1C 6.9 (A) 03/10/2022     Assessment/Plan  1. Abdominal pain, chronic, epigastric Last flare of pancreatitis was about 2 months ago but continues to have chronic pain  2. Chronic pancreatitis due to chronic alcoholism [facility]) Patient has not had alcohol in over 2 years by history but continues to have acute pancreatitis due to possible stricture. Plan to increase frequency of Dilaudid to every 4 hours as needed    4. Chronic anticoagulation Had blood clot in the splenic vein and was placed on Eliquis at that time  5. Diabetes mellitus due to underlying condition without complication, without  long-term current use of insulin ([place]) Followed by endocrinology at [facility] clinic.  A1c is at goal.  He is using a pump as well as freestyle monitoring system   Alain Honey, MD [place] [phone]

## 2022-06-30 ENCOUNTER — Ambulatory Visit: Payer: Medicaid Other | Admitting: Family Medicine

## 2022-07-13 ENCOUNTER — Ambulatory Visit: Payer: Self-pay | Admitting: Internal Medicine

## 2022-07-13 NOTE — Progress Notes (Deleted)
Name: Carlos [name]  MRN/ DOB: [id number], Mar 17, 1986   Age/ Sex: 36 y.o., male    PCP: [name], MD   Reason for Endocrinology Evaluation: Type 1 Diabetes Mellitus     Date of Initial Endocrinology Visit: 03/10/2022    PATIENT IDENTIFIER: Carlos [name] is a 36 y.o. male with a past medical history of T1DM, Hx of chronic pancreatitis due to ETOH abuse. The patient presented for initial endocrinology clinic visit on 03/10/2022 for consultative assistance with his diabetes management.    HPI: Carlos [name] was    Diagnosed with DM 09/2021 Currently checking blood sugars multiple  x / day,  through CGM Hypoglycemia episodes : yes               Symptoms: yes                 Frequency: 1/ week  Hemoglobin A1c has ranged from 9.2% in 2023, peaking at 10.1% in 2022.   He is a Freight forwarder  at [employer] , works 3rd shift    On his initial visit to our clinic, his A1c was 6.9% SUBJECTIVE:   During the last visit (03/09/2022): A1c 6.9% adjusted MDI regimen  Today (07/13/22): Carlos [name] is here for follow-up on diabetes management.  He checks his blood sugars full times daily, through CGM. The patient has *** had hypoglycemic episodes since the last clinic visit, which typically occur *** x / - most often occuring ***. The patient is *** symptomatic with these episodes, with symptoms of {symptoms; hypoglycemia:[id number]}.   Since his last visit to our clinic he was admitted for acute pancreatitis episode in June 2023  He is scheduled on 08/12/2022 for endoscopic retrograde cholangiopancreatography  HOME DIABETES REGIMEN: Lantus 16 units daily  Novolog 5 units TIDQAC Correction factor: NovoLog (BG -130/50)   Statin: no ACE-I/ARB: no Prior Diabetic Education: yes    CONTINUOUS GLUCOSE MONITORING RECORD INTERPRETATION    Dates of Recording: 4/21-03/11/2022  Sensor description:freestyle libre  Results statistics:   CGM use % of time 61  Average  207  Time in range     43   %  %  Time Above 180 31  % Time above 250 24  % Time Below target 2   Glycemic patterns summary: Patient has been noted with optimal BG control during his sleeping hours which is between 7 AM and 2 PM, patient has been noted with hypoglycemia during the waking hours  Hyperglycemic episodes postprandial  Hypoglycemic episodes occurred following a bolus  Overnight periods: Trends down to optimal   DIABETIC COMPLICATIONS: Microvascular complications:   Denies: CKD, neuropathy, retinopathy Last eye exam: Completed 05/2022  Macrovascular complications:   Denies: CAD, PVD, CVA   PAST HISTORY: Past Medical History:  Past Medical History:  Diagnosis Date   AKI (acute kidney injury) ([place]) 12/21/2021   Anxiety    Diabetes ([place])    Pancreatitis    Splenic vein thrombosis    Past Surgical History:  Past Surgical History:  Procedure Laterality Date   BIOPSY  12/02/2021   Procedure: BIOPSY;  Surgeon: Irving Copas., MD;  Location: [facility];  Service: Gastroenterology;;   ESOPHAGOGASTRODUODENOSCOPY (EGD) WITH PROPOFOL N/A 12/02/2021   Procedure: ESOPHAGOGASTRODUODENOSCOPY (EGD) WITH PROPOFOL;  Surgeon: Irving Copas., MD;  Location: [facility];  Service: Gastroenterology;  Laterality: N/A;   NO PAST SURGERIES     UPPER ESOPHAGEAL ENDOSCOPIC ULTRASOUND (EUS) N/A 12/02/2021   Procedure: UPPER ESOPHAGEAL ENDOSCOPIC ULTRASOUND (EUS);  Surgeon: Irving Copas., MD;  Location: [facility];  Service: Gastroenterology;  Laterality: N/A;    Social History:  reports that he has been smoking cigars. He started smoking about 19 years ago. He has never used smokeless tobacco. He reports that he does not currently use alcohol. He reports that he does not use drugs. Family History:  Family History  Problem Relation Age of Onset   Hypertension Mother    Alcoholism Maternal Uncle    Diabetes Maternal Grandmother    Diabetes Maternal Grandfather    Diabetes Paternal  Grandmother    Diabetes Paternal Grandfather    Colon cancer Neg Hx    Esophageal cancer Neg Hx    Inflammatory bowel disease Neg Hx    Liver disease Neg Hx    Pancreatic cancer Neg Hx    Rectal cancer Neg Hx    Stomach cancer Neg Hx      HOME MEDICATIONS: Allergies as of 07/13/2022       Reactions   Other Anaphylaxis   [name] syrup        Medication List        Accurate as of July 13, 2022  9:26 AM. If you have any questions, ask your nurse or doctor.          ADVANCED DIABETIC MULTIVITAMIN PO Take 1 tablet by mouth daily.   apixaban 5 MG Tabs tablet Commonly known as: Eliquis Take 1 tablet (5 mg total) by mouth 2 (two) times daily.   blood glucose meter kit and supplies Kit Dispense based on patient and insurance preference. Use up to four times daily as directed.   Creon 36000 UNITS Cpep capsule Generic drug: lipase/protease/amylase TAKE 2 CAPSULES BY MOUTH THREE TIMES DAILY WITH MEALS MAY ALSO TAKE 1 CAPSULE AS NEEDED WITH SNACKS   HYDROmorphone 4 MG tablet Commonly known as: Dilaudid Take 1 tablet (4 mg total) by mouth every 4 (four) hours as needed for severe pain.   insulin glargine 100 UNIT/ML Solostar Pen Commonly known as: LANTUS Inject 16 Units into the skin daily.   Insulin Pen Needle 32G X 4 MM Misc 1 Device by Does not apply route in the morning, at noon, in the evening, and at bedtime.   omeprazole 40 MG capsule Commonly known as: PRILOSEC Take 1 capsule (40 mg total) by mouth daily.   Omnipod 5 G6 Intro (Gen 5) Kit 1 Device by Does not apply route every 3 (three) days.   Omnipod 5 G6 Pod (Gen 5) Misc 1 Device by Does not apply route every 3 (three) days.   ondansetron 8 MG tablet Commonly known as: Zofran Take 1 tablet (8 mg total) by mouth every 8 (eight) hours as needed for nausea or vomiting.         ALLERGIES: Allergies  Allergen Reactions   Other Anaphylaxis    [name] syrup     REVIEW OF SYSTEMS: A  comprehensive ROS was conducted with the patient and is negative except as per HPI and below:  Review of Systems  Eyes:  Positive for blurred vision.  Gastrointestinal:  Negative for nausea and vomiting.  Genitourinary:  Negative for frequency.  Endo/Heme/Allergies:  Negative for polydipsia.      OBJECTIVE:   VITAL SIGNS: There were no vitals taken for this visit.   PHYSICAL EXAM:  General: Pt appears well and is in NAD  Hydration: Well-hydrated with moist mucous membranes and good skin turgor  HEENT: Head: Unremarkable with good dentition. Oropharynx clear  without exudate.  Eyes: External eye exam normal without stare, lid lag or exophthalmos.  EOM intact.  PERRL.  Neck: General: Supple without adenopathy or carotid bruits. Thyroid: Thyroid size normal.  No goiter or nodules appreciated. No thyroid bruit.  Lungs: Clear with good BS bilat with no rales, rhonchi, or wheezes  Heart: RRR with normal S1 and S2 and no gallops; no murmurs; no rub  Abdomen: Generalized tenderness   Extremities:  Lower extremities - No pretibial edema. No lesions.  Neuro: MS is good with appropriate affect, pt is alert and Ox3    DM foot exam: 03/10/2022  The skin of the feet is intact without sores or ulcerations. The pedal pulses are 2+ on right and 2+ on left. The sensation is intact to a screening 5.07, 10 gram monofilament bilaterally   DATA REVIEWED:  Lab Results  Component Value Date   HGBA1C 6.9 (A) 03/10/2022   HGBA1C 9.2 (H) 12/21/2021   HGBA1C 10.1 (H) 09/14/2021    Latest Reference Range & Units 03/10/22 15:11  Total CHOL/HDL Ratio  2  Cholesterol 0 - 200 mg/dL 104  HDL Cholesterol >39.00 mg/dL 56.50  LDL (calc) 0 - 99 mg/dL 18  MICROALB/CREAT RATIO 0.0 - 30.0 mg/g 1.0  NonHDL  47.39  Triglycerides 0.0 - 149.0 mg/dL 149.0  VLDL 0.0 - 40.0 mg/dL 29.8    Latest Reference Range & Units 03/10/22 15:11  TSH 0.35 - 5.50 uIU/mL 0.81  T4,Free(Direct) 0.60 - 1.60 ng/dL 1.01    Latest  Reference Range & Units 03/10/22 15:11  Creatinine,U mg/dL 70.3  Microalb, Ur 0.0 - 1.9 mg/dL <0.7  MICROALB/CREAT RATIO 0.0 - 30.0 mg/g 1.0    ASSESSMENT / PLAN / RECOMMENDATIONS:   1) Type 1 Diabetes Mellitus, Optimally controlled, Without complications - Most recent A1c of 6.9 %. Goal A1c < 7.0 %.       I have discussed with the patient the pathophysiology of diabetes. We went over the natural progression of the disease. We talked about both insulin resistance and insulin deficiency. We stressed the importance of lifestyle changes. I explained the complications associated with diabetes including retinopathy, nephropathy, neuropathy as well as increased risk of cardiovascular disease. We went over the benefit seen with glycemic control.  I explained to the patient that diabetic patients are at higher than normal risk for amputations.  Patient is interested in insulin pump technology, he will be referred to our CDE for training on the OmniPod as well as on the Dexcom He currently has freestyle libre but explained to the patient that Dexcom will be needed to connect with insulin pump I will adjust his prandial dose of insulin, he will be provided with a correction scale Patient advised to take prandial dose insulin Premeal rather than post meal due to high risk of hypoglycemia if taking after the meal BMP, TFT, UA/CR ratio have all come back normal  MEDICATIONS: Continue Lantus 16 units daily Increase NovoLog 5 units 3 times daily before every meal Correction factor: NovoLog (BG -130/50)  EDUCATION / INSTRUCTIONS: BG monitoring instructions: Patient is instructed to check his blood sugars 3 times a day, before meals . Call [facility] if: BG persistently < 70  I reviewed the Rule of 15 for the treatment of hypoglycemia in detail with the patient. Literature supplied.   2) Diabetic complications:  Eye: Does not have known diabetic retinopathy.  Neuro/ Feet: Does not  have known diabetic peripheral neuropathy. Renal: Patient does not have known  baseline CKD. He is not on an ACEI/ARB at present.  3) Lipids: Lipid panel is acceptable, no indication for statin therapy at this time    Signed electronically by: Mack Guise, MD  [facility]  [facility] [address] [place], [place] [address] Phone: [phone] FAX: [phone]   CC: [name], [place] [address] Phone: [phone]  Fax: [phone]    Return to Endocrinology clinic as below: Future Appointments  Date Time Provider [place]  07/13/2022  2:00 PM Romelia Bromell, Melanie Crazier, MD [id number] None

## 2022-07-14 ENCOUNTER — Telehealth: Payer: Self-pay

## 2022-07-14 NOTE — Telephone Encounter (Signed)
-----   Message from [name], MD sent at 07/14/2022 11:42 AM EDT ----- I recommend holding anticoagulant 48 hours prior to endoscopic procedure ----- Message ----- From: Loretha Stapler, RN Sent: 07/14/2022   8:59 AM EDT To: [name], MD

## 2022-07-14 NOTE — Telephone Encounter (Signed)
The pt has been advised to stop Eliquis 48 hours prior to the upcoming appt.

## 2022-07-19 ENCOUNTER — Encounter (HOSPITAL_COMMUNITY): Payer: Self-pay

## 2022-07-19 ENCOUNTER — Other Ambulatory Visit: Payer: Self-pay

## 2022-07-19 ENCOUNTER — Emergency Department (HOSPITAL_COMMUNITY): Payer: Managed Care, Other (non HMO)

## 2022-07-19 ENCOUNTER — Inpatient Hospital Stay (HOSPITAL_COMMUNITY)
Admission: EM | Admit: 2022-07-19 | Discharge: 2022-07-22 | DRG: 438 | Disposition: A | Discharge status: Home / Self Care | Payer: Managed Care, Other (non HMO) | Attending: Family Medicine | Admitting: Family Medicine

## 2022-07-19 DIAGNOSIS — R17 Unspecified jaundice: Secondary | ICD-10-CM | POA: Diagnosis present

## 2022-07-19 DIAGNOSIS — F1011 Alcohol abuse, in remission: Secondary | ICD-10-CM | POA: Diagnosis present

## 2022-07-19 DIAGNOSIS — Z91018 Allergy to other foods: Secondary | ICD-10-CM

## 2022-07-19 DIAGNOSIS — E1065 Type 1 diabetes mellitus with hyperglycemia: Secondary | ICD-10-CM | POA: Diagnosis present

## 2022-07-19 DIAGNOSIS — K859 Acute pancreatitis without necrosis or infection, unspecified: Secondary | ICD-10-CM | POA: Diagnosis not present

## 2022-07-19 DIAGNOSIS — Z9641 Presence of insulin pump (external) (internal): Secondary | ICD-10-CM | POA: Diagnosis present

## 2022-07-19 DIAGNOSIS — R030 Elevated blood-pressure reading, without diagnosis of hypertension: Secondary | ICD-10-CM | POA: Diagnosis present

## 2022-07-19 DIAGNOSIS — Z79899 Other long term (current) drug therapy: Secondary | ICD-10-CM

## 2022-07-19 DIAGNOSIS — R1013 Epigastric pain: Secondary | ICD-10-CM | POA: Diagnosis not present

## 2022-07-19 DIAGNOSIS — K861 Other chronic pancreatitis: Secondary | ICD-10-CM | POA: Diagnosis present

## 2022-07-19 DIAGNOSIS — I81 Portal vein thrombosis: Secondary | ICD-10-CM | POA: Diagnosis present

## 2022-07-19 DIAGNOSIS — Z833 Family history of diabetes mellitus: Secondary | ICD-10-CM

## 2022-07-19 DIAGNOSIS — D649 Anemia, unspecified: Secondary | ICD-10-CM | POA: Diagnosis present

## 2022-07-19 DIAGNOSIS — Z794 Long term (current) use of insulin: Secondary | ICD-10-CM

## 2022-07-19 DIAGNOSIS — F17290 Nicotine dependence, other tobacco product, uncomplicated: Secondary | ICD-10-CM | POA: Diagnosis present

## 2022-07-19 DIAGNOSIS — Z8249 Family history of ischemic heart disease and other diseases of the circulatory system: Secondary | ICD-10-CM

## 2022-07-19 LAB — COMPREHENSIVE METABOLIC PANEL
ALT: 14 U/L (ref 0–44)
AST: 19 U/L (ref 15–41)
Albumin: 4 g/dL (ref 3.5–5.0)
Alkaline Phosphatase: 81 U/L (ref 38–126)
Anion gap: 10 (ref 5–15)
BUN: 9 mg/dL (ref 6–20)
CO2: 24 mmol/L (ref 22–32)
Calcium: 9.1 mg/dL (ref 8.9–10.3)
Chloride: 98 mmol/L (ref 98–111)
Creatinine, Ser: 1.16 mg/dL (ref 0.61–1.24)
GFR, Estimated: >60 mL/min (ref >60)
Glucose, Bld: 413 mg/dL — ABNORMAL HIGH (ref 70–99)
Potassium: 3.6 mmol/L (ref 3.5–5.1)
Sodium: 132 mmol/L — ABNORMAL LOW (ref 135–145)
Total Bilirubin: 1.4 mg/dL — ABNORMAL HIGH (ref 0.3–1.2)
Total Protein: 7.2 g/dL (ref 6.5–8.1)

## 2022-07-19 LAB — URINALYSIS, ROUTINE W REFLEX MICROSCOPIC
Bacteria, UA: NONE SEEN
Bilirubin Urine: NEGATIVE
Glucose, UA: >=500 mg/dL — AB
Hgb urine dipstick: NEGATIVE
Ketones, ur: 5 mg/dL — AB
Leukocytes,Ua: NEGATIVE
Nitrite: NEGATIVE
Protein, ur: NEGATIVE mg/dL
Specific Gravity, Urine: 1.027 (ref 1.005–1.030)
pH: 6 (ref 5.0–8.0)

## 2022-07-19 LAB — CBC
HCT: 39.3 % (ref 39.0–52.0)
Hemoglobin: 13.3 g/dL (ref 13.0–17.0)
MCH: 33.8 pg (ref 26.0–34.0)
MCHC: 33.8 g/dL (ref 30.0–36.0)
MCV: 100 fL (ref 80.0–100.0)
Platelets: 182 10*3/uL (ref 150–400)
RBC: 3.93 MIL/uL — ABNORMAL LOW (ref 4.22–5.81)
RDW: 13.3 % (ref 11.5–15.5)
WBC: 4.5 10*3/uL (ref 4.0–10.5)
nRBC: 0 % (ref 0.0–0.2)

## 2022-07-19 LAB — LIPASE, BLOOD: Lipase: 76 U/L — ABNORMAL HIGH (ref 11–51)

## 2022-07-19 MED ORDER — ONDANSETRON 4 MG PO TBDP
4.0000 mg | ORAL_TABLET | Freq: Once | ORAL | Status: AC
Start: 2022-07-19 — End: 2022-07-19
  Administered 2022-07-19: 4 mg via ORAL
  Filled 2022-07-19: qty 1

## 2022-07-19 MED ORDER — IOHEXOL 300 MG/ML  SOLN
100.0000 mL | Freq: Once | INTRAMUSCULAR | Status: AC | PRN
  Administered 2022-07-19: 100 mL via INTRAVENOUS

## 2022-07-19 MED ORDER — OXYCODONE-ACETAMINOPHEN 5-325 MG PO TABS
1.0000 | ORAL_TABLET | Freq: Once | ORAL | Status: AC
  Administered 2022-07-19: 1 via ORAL
  Filled 2022-07-19: qty 1

## 2022-07-19 NOTE — ED Provider Triage Note (Signed)
Emergency Medicine Provider Triage Evaluation Note  Carlos [name] , a 36 y.o. male with history of type I diabetes, alcohol dependence, chronic pancreatitis was evaluated in triage.  Pt complains of suspected pancreatitis flareup.  Patient states pain started about 3 weeks ago but he has been managing at home with his medications including hydrocodone.  However, last night while at work he had a severe flareup of pain and has been constant since then.  He has constant nausea as well although he denies vomiting and diarrhea.  He reports no drinking in the last 3 weeks.  Review of Systems  Positive:  Negative:   Physical Exam  BP (!) 140/97 (BP Location: Left Arm)   Pulse 95   Temp 99.3 F (37.4 C) (Oral)   Resp 16   Ht 6\' 5"  (1.956 m)   Wt 73 kg   SpO2 100%   BMI 19.09 kg/m  Gen:   Awake, no distress   Resp:  Normal effort  MSK:   Moves extremities without difficulty  Other:  Exquisitely tender to the epigastrium  Medical Decision Making  Medically screening exam initiated at 6:48 PM.  Appropriate orders placed.  was informed that the remainder of the evaluation will be completed by another provider, this initial triage assessment does not replace that evaluation, and the importance of remaining in the ED until their evaluation is complete.     Carlos [name], Carlos [name] 07/19/22 1850

## 2022-07-19 NOTE — ED Triage Notes (Signed)
Patient has hx of pancreatitis and feels he is having an attack. Reports nausea and hyperglycemia

## 2022-07-20 DIAGNOSIS — D649 Anemia, unspecified: Secondary | ICD-10-CM

## 2022-07-20 DIAGNOSIS — I81 Portal vein thrombosis: Secondary | ICD-10-CM

## 2022-07-20 DIAGNOSIS — Z9641 Presence of insulin pump (external) (internal): Secondary | ICD-10-CM | POA: Diagnosis present

## 2022-07-20 DIAGNOSIS — E1065 Type 1 diabetes mellitus with hyperglycemia: Secondary | ICD-10-CM | POA: Diagnosis present

## 2022-07-20 DIAGNOSIS — Z79899 Other long term (current) drug therapy: Secondary | ICD-10-CM | POA: Diagnosis not present

## 2022-07-20 DIAGNOSIS — Z91018 Allergy to other foods: Secondary | ICD-10-CM | POA: Diagnosis not present

## 2022-07-20 DIAGNOSIS — Z833 Family history of diabetes mellitus: Secondary | ICD-10-CM | POA: Diagnosis not present

## 2022-07-20 DIAGNOSIS — F17290 Nicotine dependence, other tobacco product, uncomplicated: Secondary | ICD-10-CM | POA: Diagnosis present

## 2022-07-20 DIAGNOSIS — F1011 Alcohol abuse, in remission: Secondary | ICD-10-CM | POA: Diagnosis present

## 2022-07-20 DIAGNOSIS — K861 Other chronic pancreatitis: Secondary | ICD-10-CM

## 2022-07-20 DIAGNOSIS — K859 Acute pancreatitis without necrosis or infection, unspecified: Secondary | ICD-10-CM | POA: Diagnosis present

## 2022-07-20 DIAGNOSIS — Z794 Long term (current) use of insulin: Secondary | ICD-10-CM | POA: Diagnosis not present

## 2022-07-20 DIAGNOSIS — Z8249 Family history of ischemic heart disease and other diseases of the circulatory system: Secondary | ICD-10-CM | POA: Diagnosis not present

## 2022-07-20 DIAGNOSIS — R030 Elevated blood-pressure reading, without diagnosis of hypertension: Secondary | ICD-10-CM | POA: Diagnosis present

## 2022-07-20 DIAGNOSIS — R1013 Epigastric pain: Secondary | ICD-10-CM | POA: Diagnosis present

## 2022-07-20 DIAGNOSIS — R17 Unspecified jaundice: Secondary | ICD-10-CM | POA: Diagnosis present

## 2022-07-20 LAB — ETHANOL: Alcohol, Ethyl (B): <10 mg/dL (ref <10)

## 2022-07-20 LAB — CBC WITH DIFFERENTIAL/PLATELET
Abs Immature Granulocytes: 0.01 10*3/uL (ref 0.00–0.07)
Basophils Absolute: 0 10*3/uL (ref 0.0–0.1)
Basophils Relative: 0 %
Eosinophils Absolute: 0.2 10*3/uL (ref 0.0–0.5)
Eosinophils Relative: 5 %
HCT: 39.3 % (ref 39.0–52.0)
Hemoglobin: 12.8 g/dL — ABNORMAL LOW (ref 13.0–17.0)
Immature Granulocytes: 0 %
Lymphocytes Relative: 21 %
Lymphs Abs: 0.9 10*3/uL (ref 0.7–4.0)
MCH: 33.5 pg (ref 26.0–34.0)
MCHC: 32.6 g/dL (ref 30.0–36.0)
MCV: 102.9 fL — ABNORMAL HIGH (ref 80.0–100.0)
Monocytes Absolute: 0.5 10*3/uL (ref 0.1–1.0)
Monocytes Relative: 10 %
Neutro Abs: 2.8 10*3/uL (ref 1.7–7.7)
Neutrophils Relative %: 64 %
Platelets: 173 10*3/uL (ref 150–400)
RBC: 3.82 MIL/uL — ABNORMAL LOW (ref 4.22–5.81)
RDW: 13.6 % (ref 11.5–15.5)
WBC: 4.5 10*3/uL (ref 4.0–10.5)
nRBC: 0 % (ref 0.0–0.2)

## 2022-07-20 LAB — COMPREHENSIVE METABOLIC PANEL
ALT: 15 U/L (ref 0–44)
AST: 19 U/L (ref 15–41)
Albumin: 3.6 g/dL (ref 3.5–5.0)
Alkaline Phosphatase: 77 U/L (ref 38–126)
Anion gap: 7 (ref 5–15)
BUN: 6 mg/dL (ref 6–20)
CO2: 26 mmol/L (ref 22–32)
Calcium: 8.7 mg/dL — ABNORMAL LOW (ref 8.9–10.3)
Chloride: 102 mmol/L (ref 98–111)
Creatinine, Ser: 0.93 mg/dL (ref 0.61–1.24)
GFR, Estimated: >60 mL/min (ref >60)
Glucose, Bld: 255 mg/dL — ABNORMAL HIGH (ref 70–99)
Potassium: 3.8 mmol/L (ref 3.5–5.1)
Sodium: 135 mmol/L (ref 135–145)
Total Bilirubin: 1.3 mg/dL — ABNORMAL HIGH (ref 0.3–1.2)
Total Protein: 6.6 g/dL (ref 6.5–8.1)

## 2022-07-20 LAB — RAPID URINE DRUG SCREEN, HOSP PERFORMED
Amphetamines: NOT DETECTED
Barbiturates: NOT DETECTED
Benzodiazepines: NOT DETECTED
Cocaine: NOT DETECTED
Opiates: NOT DETECTED
Tetrahydrocannabinol: NOT DETECTED

## 2022-07-20 LAB — TRIGLYCERIDES: Triglycerides: 153 mg/dL — ABNORMAL HIGH (ref <150)

## 2022-07-20 LAB — CBG MONITORING, ED
Glucose-Capillary: 188 mg/dL — ABNORMAL HIGH (ref 70–99)
Glucose-Capillary: 188 mg/dL — ABNORMAL HIGH (ref 70–99)
Glucose-Capillary: 281 mg/dL — ABNORMAL HIGH (ref 70–99)

## 2022-07-20 LAB — HEMOGLOBIN A1C
Hgb A1c MFr Bld: 7 % — ABNORMAL HIGH (ref 4.8–5.6)
Mean Plasma Glucose: 154.2 mg/dL

## 2022-07-20 LAB — MAGNESIUM: Magnesium: 1.7 mg/dL (ref 1.7–2.4)

## 2022-07-20 LAB — GLUCOSE, CAPILLARY: Glucose-Capillary: 286 mg/dL — ABNORMAL HIGH (ref 70–99)

## 2022-07-20 MED ORDER — PANTOPRAZOLE SODIUM 40 MG PO TBEC
40.0000 mg | DELAYED_RELEASE_TABLET | Freq: Every day | ORAL | Status: DC
  Administered 2022-07-20 – 2022-07-22 (×3): 40 mg via ORAL
  Filled 2022-07-20 (×3): qty 1

## 2022-07-20 MED ORDER — PANCRELIPASE (LIP-PROT-AMYL) 36000-114000 UNITS PO CPEP
72000.0000 [IU] | ORAL_CAPSULE | Freq: Three times a day (TID) | ORAL | Status: DC
  Administered 2022-07-20 – 2022-07-22 (×7): 72000 [IU] via ORAL
  Filled 2022-07-20 (×7): qty 2

## 2022-07-20 MED ORDER — ACETAMINOPHEN 325 MG PO TABS: 650.0000 mg | ORAL_TABLET | Freq: Four times a day (QID) | ORAL | Status: DC | PRN

## 2022-07-20 MED ORDER — ACETAMINOPHEN 650 MG RE SUPP: 650.0000 mg | Freq: Four times a day (QID) | RECTAL | Status: DC | PRN

## 2022-07-20 MED ORDER — ENOXAPARIN SODIUM 40 MG/0.4ML IJ SOSY
40.0000 mg | PREFILLED_SYRINGE | INTRAMUSCULAR | Status: DC
  Administered 2022-07-20 – 2022-07-21 (×2): 40 mg via SUBCUTANEOUS
  Filled 2022-07-20 (×2): qty 0.4

## 2022-07-20 MED ORDER — SODIUM CHLORIDE 0.9% FLUSH
3.0000 mL | Freq: Two times a day (BID) | INTRAVENOUS | Status: DC
  Administered 2022-07-20 – 2022-07-22 (×4): 3 mL via INTRAVENOUS

## 2022-07-20 MED ORDER — LACTATED RINGERS IV SOLN
INTRAVENOUS | Status: DC
  Filled 2022-07-20: qty 1000

## 2022-07-20 MED ORDER — HYDRALAZINE HCL 20 MG/ML IJ SOLN: 10.0000 mg | INTRAMUSCULAR | Status: DC | PRN

## 2022-07-20 MED ORDER — ONDANSETRON HCL 4 MG/2ML IJ SOLN: 4.0000 mg | Freq: Four times a day (QID) | INTRAMUSCULAR | Status: DC | PRN

## 2022-07-20 MED ORDER — NALOXONE HCL 0.4 MG/ML IJ SOLN: 0.4000 mg | INTRAMUSCULAR | Status: DC | PRN

## 2022-07-20 MED ORDER — INSULIN ASPART 100 UNIT/ML IJ SOLN
0.0000 [IU] | Freq: Four times a day (QID) | INTRAMUSCULAR | Status: DC
  Administered 2022-07-20: 2 [IU] via SUBCUTANEOUS
  Administered 2022-07-20: 1 [IU] via SUBCUTANEOUS

## 2022-07-20 MED ORDER — LACTATED RINGERS IV BOLUS
1000.0000 mL | Freq: Once | INTRAVENOUS | Status: AC
  Administered 2022-07-20: 1000 mL via INTRAVENOUS

## 2022-07-20 MED ORDER — HYDROMORPHONE HCL 2 MG PO TABS
4.0000 mg | ORAL_TABLET | ORAL | Status: DC | PRN
  Administered 2022-07-20 – 2022-07-22 (×5): 4 mg via ORAL
  Filled 2022-07-20 (×5): qty 2

## 2022-07-20 MED ORDER — HYDROMORPHONE HCL 1 MG/ML IJ SOLN
1.0000 mg | Freq: Once | INTRAMUSCULAR | Status: AC
  Administered 2022-07-20: 1 mg via INTRAVENOUS
  Filled 2022-07-20: qty 1

## 2022-07-20 MED ORDER — CALCIUM GLUCONATE-NACL 1-0.675 GM/50ML-% IV SOLN
1.0000 g | Freq: Once | INTRAVENOUS | Status: AC
  Administered 2022-07-21: 1000 mg via INTRAVENOUS
  Filled 2022-07-20: qty 50

## 2022-07-20 MED ORDER — INSULIN PUMP
Freq: Three times a day (TID) | SUBCUTANEOUS | Status: DC
  Administered 2022-07-20: 0.15 via SUBCUTANEOUS
  Administered 2022-07-21: 1.55 via SUBCUTANEOUS
  Administered 2022-07-21: 1.1 via SUBCUTANEOUS
  Administered 2022-07-21: 0.6 via SUBCUTANEOUS
  Administered 2022-07-21: 0.8 via SUBCUTANEOUS
  Administered 2022-07-21: 0.1 via SUBCUTANEOUS
  Administered 2022-07-22: 1.3 via SUBCUTANEOUS
  Filled 2022-07-20: qty 1

## 2022-07-20 MED ORDER — PANCRELIPASE (LIP-PROT-AMYL) 36000-114000 UNITS PO CPEP: 36000.0000 [IU] | ORAL_CAPSULE | Freq: Three times a day (TID) | ORAL | Status: DC | PRN

## 2022-07-20 MED ORDER — HYDROMORPHONE HCL 1 MG/ML IJ SOLN
0.5000 mg | INTRAMUSCULAR | Status: DC | PRN
  Administered 2022-07-20 – 2022-07-21 (×9): 0.5 mg via INTRAVENOUS
  Filled 2022-07-20: qty 1
  Filled 2022-07-20 (×2): qty 0.5
  Filled 2022-07-20: qty 1
  Filled 2022-07-20 (×5): qty 0.5

## 2022-07-20 MED ORDER — ONDANSETRON HCL 4 MG PO TABS: 4.0000 mg | ORAL_TABLET | Freq: Four times a day (QID) | ORAL | Status: DC | PRN

## 2022-07-20 MED ORDER — ALBUTEROL SULFATE (2.5 MG/3ML) 0.083% IN NEBU: 2.5000 mg | INHALATION_SOLUTION | Freq: Four times a day (QID) | RESPIRATORY_TRACT | Status: DC | PRN

## 2022-07-20 NOTE — ED Provider Notes (Signed)
[facility]-EMERGENCY DEPT [facility] Emergency Department Provider Note MRN:  [id number]  Arrival date & time: 07/20/22     Chief Complaint   Abdominal Pain   History of Present Illness   Carlos [name] is a 36 y.o. year-old male presents to the ED with chief complaint of epigastric abdominal pain with associated nausea.  He has history of pancreatitis, and states this feels similar.  He states that he thinks this episode was triggered by drinking some NyQuil a few days ago.  He has tried sticking to a Mole and liquid diet, but reports that the pain has significantly worsened and he had to come to the hospital for pain control.  He denies any fevers denies any other associated symptoms..  History provided by patient.   Review of Systems  Pertinent positive and negative review of systems noted in HPI.    Physical Exam   Vitals:   07/20/22 0115 07/20/22 0130  BP: (!) 146/108 (!) 144/105  Pulse: 70 72  Resp: 11 15  Temp:    SpO2: 100% 100%    CONSTITUTIONAL:  well-appearing, NAD NEURO:  Alert and oriented x 3, CN 3-12 grossly intact EYES:  eyes equal and reactive ENT/NECK:  Supple, no stridor  CARDIO:  normal rate, regular rhythm, appears well-perfused  PULM:  No respiratory distress, CTAB GI/GU:  non-distended, epigastric tenderness MSK/SPINE:  No gross deformities, no edema, moves all extremities  SKIN:  no rash, atraumatic   *Additional and/or pertinent findings included in MDM below  Diagnostic and Interventional Summary     Labs Reviewed  LIPASE, BLOOD - Abnormal; Notable for the following components:      Result Value   Lipase 76 (*)    All other components within normal limits  COMPREHENSIVE METABOLIC PANEL - Abnormal; Notable for the following components:   Sodium 132 (*)    Glucose, Bld 413 (*)    Total Bilirubin 1.4 (*)    All other components within normal limits  CBC - Abnormal; Notable for the following components:   RBC 3.93 (*)    All other  components within normal limits  URINALYSIS, ROUTINE W REFLEX MICROSCOPIC - Abnormal; Notable for the following components:   Glucose, UA >=500 (*)    Ketones, ur 5 (*)    All other components within normal limits  CBG MONITORING, ED - Abnormal; Notable for the following components:   Glucose-Capillary 281 (*)    All other components within normal limits  CBC WITH DIFFERENTIAL/PLATELET  COMPREHENSIVE METABOLIC PANEL  MAGNESIUM  ETHANOL  RAPID URINE DRUG SCREEN, HOSP PERFORMED  HEMOGLOBIN A1C    CT ABDOMEN PELVIS W CONTRAST  Final Result      Medications  acetaminophen (TYLENOL) tablet 650 mg (has no administration in time range)    Or  acetaminophen (TYLENOL) suppository 650 mg (has no administration in time range)  naloxone (NARCAN) injection 0.4 mg (has no administration in time range)  HYDROmorphone (DILAUDID) injection 0.5 mg (has no administration in time range)  ondansetron (ZOFRAN) injection 4 mg (has no administration in time range)  lactated ringers infusion (has no administration in time range)  insulin aspart (novoLOG) injection 0-9 Units (has no administration in time range)  oxyCODONE-acetaminophen (PERCOCET/ROXICET) 5-325 MG per tablet 1 tablet (1 tablet Oral Given 07/19/22 1858)  ondansetron (ZOFRAN-ODT) disintegrating tablet 4 mg (4 mg Oral Given 07/19/22 1858)  iohexol (OMNIPAQUE) 300 MG/ML solution 100 mL (100 mLs Intravenous Contrast Given 07/19/22 2203)  HYDROmorphone (DILAUDID) injection 1 mg (1  mg Intravenous Given 07/20/22 0011)  lactated ringers bolus 1,000 mL (0 mLs Intravenous Stopped 07/20/22 0137)  lactated ringers bolus 1,000 mL (1,000 mLs Intravenous New Bag/Given 07/20/22 0137)  HYDROmorphone (DILAUDID) injection 1 mg (1 mg Intravenous Given 07/20/22 1191)     Procedures  /  Critical Care Procedures  ED Course and Medical Decision Making  I have reviewed the triage vital signs, the nursing notes, and pertinent available records from the EMR.  Social  Determinants Affecting Complexity of Care: Patient has no clinically significant social determinants affecting this chief complaint..   ED Course:    Medical Decision Making Patient with history of pancreatitis presenting with epigastric pain.  Concern for acute on chronic pancreatitis.  He reports some nausea, but denies vomiting.  Has been trialing liquid diet at home without success.    Problems Addressed: Acute pancreatitis, unspecified complication status, unspecified pancreatitis type: acute illness or injury  Amount and/or Complexity of Data Reviewed Labs: ordered.    Details: Lipase 76 Radiology: independent interpretation performed.    Details: No free air  Radiologist comments that there is inflammatory changes of the pancreas consistent with acute pancreatitis  Risk Prescription drug management. Decision regarding hospitalization.     Consultants: I discussed the case with Hospitalist, Dr. Arlean Hopping, who is appreciated for admitting.   Treatment and Plan: Patient's exam and diagnostic results are concerning for acute pancreatitis.  Feel that patient will need admission to the hospital for further treatment and evaluation.    Final Clinical Impressions(s) / ED Diagnoses     ICD-10-CM   1. Acute pancreatitis, unspecified complication status, unspecified pancreatitis type  K85.90       ED Discharge Orders     None         Discharge Instructions Discussed with and Provided to Patient:   Discharge Instructions   None      Roxy Horseman, PA-C 07/20/22 0211    Mesner, Barbara Cower, MD 07/20/22 [phone]

## 2022-07-20 NOTE — Inpatient Diabetes Management (Signed)
Inpatient Diabetes Program Recommendations  AACE/ADA: New Consensus Statement on Inpatient Glycemic Control (2015)  Target Ranges:  Prepandial:   less than 140 mg/dL      Peak postprandial:   less than 180 mg/dL (1-2 hours)      Critically ill patients:  140 - 180 mg/dL   Lab Results  Component Value Date   GLUCAP 188 (H) 07/20/2022   HGBA1C 7.0 (H) 07/20/2022    Review of Glycemic Control  Diabetes history: DM type 1 Outpatient Diabetes medications: Omnipod with Novolog and Dexcom CGM Basal: Pt receives 12 units of basal insulin within a 24 hour period Target 130 Carb ratio: 1 units of Novolog for every 12 grams of carbohydrates Sensitivity: 1 units of insulin drops glucose 50 points Duration of insulin 4 hours  Current orders for Inpatient glycemic control:  Novolog 0-9 units Q6 hours  A1c 7% on 9/12  Inpatient Diabetes Program Recommendations:    Spoke with pt at bedside and obtained insulin pump settings. Pt was recently placed on insulin pump since March of this year. Pt sees Dr. Lonzo Cloud, Endocrinologist outpatient for Diabetes control. Pt reports seeing her very frequently. Pts A1c was 7% and is very well controlled at baseline. Pt can bring insulin pump supplies to hospital and desires to operate insulin pump while here in the hospital. Pt to notify nurse, who will let me know so we can order the insulin pump order set.   Thanks,  Christena Deem RN, MSN, BC-ADM Inpatient Diabetes Coordinator Team Pager [phone] (8a-5p)

## 2022-07-20 NOTE — Plan of Care (Signed)

## 2022-07-20 NOTE — Progress Notes (Signed)
  Carryover admission to the Day Admitter.  I discussed this case with the EDP, Dr. Roxy Horseman, PA.  Per these discussions:   This is a 36 year old male with history of previous hospitalizations for acute pancreatitis, who is being admitted this evening with acute pancreatitis after presenting with 2 weeks of progressive epigastric pain associated with nausea, inability to tolerate p.o., with the symptoms noted to be refractory to initiation of Null diet over the last week, and with pain control suboptimal as an outpatient.   Presenting CT abdomen/pelvis shows inflammatory changes of the pancreas consistent with acute pancreatitis, while showing evidence of biliary obstruction.   He also has a history of diabetes, initial blood sugar noted to be 413 in the absence of anion gap metabolic acidosis.  With IV fluids alone, blood sugar improved to 281.  He notes that his previous hospitalizations for acute pancreatitis were felt to be on the basis of alcoholic pancreatitis.  However, he notes interval abstinence from alcohol.  I have placed an order for observation for further evaluation management of acute pancreatitis.  I have placed some additional preliminary admit orders via the adult multi-morbid admission order set. I have also ordered morning labs, including CMP, CBC, serum magnesium level.  Add on serum ethanol level as well as urinary drug screen.  NPO.  Continuous lactated Ringer's.  As needed IV Dilaudid, as needed IV Zofran.  Also ordered every 6 hours Accu-Cheks with associated sliding scale insulin coverage.    Carlos Pigg, DO Hospitalist

## 2022-07-20 NOTE — H&P (Signed)
History and Physical    Patient: Carlos [name] [id number]:[id number] DOB: 1986/07/11 DOA: 07/19/2022 DOS: the patient was seen and examined on 07/20/2022 PCP: [name], MD  Patient coming from: Home  Chief Complaint:  Chief Complaint  Patient presents with   Abdominal Pain   HPI: Carlos [name] is a 36 y.o. male with medical history significant of diabetes mellitus type 1, pancreatitis, portal vein thrombosis on anticoagulation, chronic pain with opioid dependence, and alcohol abuse in remission who presented with complaints of 3 weeks of progressively worsening epigastric pain.  Pain is sharp and pulsating in nature and does not move.  Reports associated symptoms of insomnia and nausea.  Denies having any significant fever, vomiting, shortness of breath, cough, or diarrhea. He had been on Dilaudid since March of this year due to his chronic pancreatitis.  Patient reports that he has been abstinent from alcohol for 3 years come this November.  He has been following with Dr. Rush Landmark of [place] and was recently been referred to [facility] for second opinion on July 31 as his smoldering pancreatitis for which it was thought that patient would benefit from ERCP with stent placement.  He was scheduled to have this done on 10/5 with Dr. Rush Landmark.  Last night patient reports being awakened out of his sleep with severe 10/10 epigastric pain for which he came to the hospital for further evaluation.   Patient notes that he had ran out out of his Eliquis at the end of last month and had not refilled it after talking to his primary because of the upcoming procedure.  Review of office notes from 9/6 recommend patient stopping Eliquis just 48 hours prior to the appointment.  While in the emergency department patient was noted to have blood pressures elevated up to 158/112 and all other vital signs relatively maintained.  Labs from 9/11 noted sodium 132, glucose 413 without anion gap, lipase 76, and total bilirubin 1.4.CT  abdomen/pelvis shows inflammatory changes of the pancreas consistent with acute pancreatitis, pancreatic calcifications with ductal prominence similar to prior study thought secondary to chronic pancreatitis, and chronic portal vein thrombosis with cavernous transformation.  Patient received 2 L of IV fluids, antiemetics, p.o., and IV pain medications.  Insulin pump was removed and patient was started on sliding scale of insulin.  Repeat blood sugars are down to 281 with IV fluids.  Review of Systems: As mentioned in the history of present illness. All other systems reviewed and are negative. Past Medical History:  Diagnosis Date   AKI (acute kidney injury) ([place]) 12/21/2021   Anxiety    Diabetes ([place])    Pancreatitis    Splenic vein thrombosis    Past Surgical History:  Procedure Laterality Date   BIOPSY  12/02/2021   Procedure: BIOPSY;  Surgeon: Irving Copas., MD;  Location: [facility];  Service: Gastroenterology;;   ESOPHAGOGASTRODUODENOSCOPY (EGD) WITH PROPOFOL N/A 12/02/2021   Procedure: ESOPHAGOGASTRODUODENOSCOPY (EGD) WITH PROPOFOL;  Surgeon: Irving Copas., MD;  Location: [facility];  Service: Gastroenterology;  Laterality: N/A;   NO PAST SURGERIES     UPPER ESOPHAGEAL ENDOSCOPIC ULTRASOUND (EUS) N/A 12/02/2021   Procedure: UPPER ESOPHAGEAL ENDOSCOPIC ULTRASOUND (EUS);  Surgeon: Irving Copas., MD;  Location: [facility];  Service: Gastroenterology;  Laterality: N/A;   Social History:  reports that he has been smoking cigars. He started smoking about 19 years ago. He has never used smokeless tobacco. He reports that he does not currently use alcohol. He reports that he does not  use drugs.  Allergies  Allergen Reactions   Other Anaphylaxis    [name] - anaphylaxis    Family History  Problem Relation Age of Onset   Hypertension Mother    Alcoholism Maternal Uncle    Diabetes Maternal Grandmother    Diabetes Maternal Grandfather    Diabetes  Paternal Grandmother    Diabetes Paternal Grandfather    Colon cancer Neg Hx    Esophageal cancer Neg Hx    Inflammatory bowel disease Neg Hx    Liver disease Neg Hx    Pancreatic cancer Neg Hx    Rectal cancer Neg Hx    Stomach cancer Neg Hx     Prior to Admission medications   Medication Sig Start Date End Date Taking? Authorizing Provider  CREON 36000-114000 units CPEP capsule TAKE 2 CAPSULES BY MOUTH THREE TIMES DAILY WITH MEALS MAY ALSO TAKE 1 CAPSULE AS NEEDED WITH SNACKS Patient taking differently: Take 36,000-72,000 Units by mouth See admin instructions. 2 capsules (72000 units) 3 times daily with meals, 1 capsule (36000 units) with snacks 05/13/22  Yes [name], Telford Nab., MD  diphenhydramine-acetaminophen (TYLENOL PM) 25-500 MG TABS tablet Take 2 tablets by mouth at bedtime as needed (sleep, pain).   Yes [provider]  HYDROmorphone (DILAUDID) 4 MG tablet Take 1 tablet (4 mg total) by mouth every 4 (four) hours as needed for severe pain. 06/22/22  Yes [name], MD  insulin aspart (NOVOLOG) 100 UNIT/ML injection Inject 120-150 Units into the skin See admin instructions. 120-150 units into insulin pump every 3 days   Yes [provider]  Multiple Vitamins-Minerals (ADVANCED DIABETIC MULTIVITAMIN PO) Take 1 tablet by mouth daily.   Yes [provider]  naproxen sodium (ALEVE) 220 MG tablet Take 440 mg by mouth daily as needed (pain).   Yes [provider]  omeprazole (PRILOSEC) 40 MG capsule Take 1 capsule (40 mg total) by mouth daily. 05/27/22  Yes [name], Dinah C, NP  ondansetron (ZOFRAN) 8 MG tablet Take 1 tablet (8 mg total) by mouth every 8 (eight) hours as needed for nausea or vomiting. 04/23/22  Yes Ezequiel Essex, MD  apixaban (ELIQUIS) 5 MG TABS tablet Take 1 tablet (5 mg total) by mouth 2 (two) times daily. Patient not taking: Reported on 07/20/2022 03/23/22   [name], MD  Insulin Disposable Pump (OMNIPOD 5 G6 INTRO, GEN  5,) KIT 1 Device by Does not apply route every 3 (three) days. 03/10/22   [name], Melanie Crazier, MD  insulin glargine (LANTUS) 100 UNIT/ML Solostar Pen Inject 16 Units into the skin daily. Patient not taking: Reported on 07/20/2022 03/10/22   [name], Melanie Crazier, MD    Physical Exam: Vitals:   07/20/22 0700 07/20/22 0730 07/20/22 0735 07/20/22 0738  BP: (!) 128/91   (!) 141/96  Pulse: 70 76    Resp: 15 13    Temp:   98.3 F (36.8 C)   TempSrc:   Oral   SpO2: 97% 98%    Weight:      Height:      Constitutional: M male currently in no acute distress Eyes: PERRL, lids normal with conjunctiva with slight brownish hue likely due to melanin no signs of scleral icterus.  Patient wearing glasses ENMT: Mucous membranes are moist.  Neck: normal, supple  Respiratory: clear to auscultation bilaterally, no wheezing, no crackles. Normal respiratory effort.   Cardiovascular: Regular rate and rhythm, no murmurs / rubs / gallops. No extremity edema. 2+ pedal pulses.  No carotid bruits.  Abdomen: Tenderness appreciated.  Bowel sounds present all 4 quadrants. Musculoskeletal: no clubbing / cyanosis. No gross deformity appreciated. Skin: no rashes, lesions, ulcers. No induration Neurologic: CN 2-12 grossly intact.  Strength 5/5 in all 4.  Psychiatric: Normal judgment and insight. Alert and oriented x 3. Normal mood.   Data Reviewed:  Reviewed labs, imaging and pertinent records as noted above in HPI  Assessment and Plan: Pancreatitis Acute on chronic.  Patient presents with severe sharp pulsating epigastric pain.  Lipase was elevated at 76 and total bilirubin mildly elevated up to 1.4.  CT showing concern for pancreatitis with chronic biliary duct dilatation and chronic portal vein thrombosis.  Plan had been for patient to have ERCP with stent placement with Dr. Rush Landmark on 10/5 since is going to be a possible cause of symptoms. -Admit to a MedSurg bed -Advance diet as tolerated -Check  triglyceride level -Continue Creon -Continue Dilaudid as needed for pain -Continue with fluids at 150 mL/h -Antiemetics as needed  Uncontrolled diabetes mellitus type 1 with hyperglycemia On admission glucose elevated up to 413 without anion gap.  Blood sugars improved down to 281 with just IV fluids.  Hemoglobin A1c 7. -Hypoglycemic protocol -CBGs every 6 hours with insulin SSI -Plan will be to transition back to his insulin pump once family member bring supplies  Portal vein thrombosis Chronic.  Initially diagnosed 01/2022 on imaging which showed nonocclusive thrombus in the portal vein secondary to patient's pancreatitis.  Patient is supposed to be on Eliquis, but admitted that he had not refill the medication since the end of last month after talking to PCP due to the upcoming procedure scheduled October 5.   -Unclear if that should be resumed at this point time  Hyperbilirubinemia Acute.  Total bilirubin 1.4-> 1.3.  CT noted chronic biliary duct dilatation. -Continue to monitor  Normocytic anemia Hemoglobin 12.8 g/dL, but vital signs otherwise noted to be stable with no reports of bleeding. -Continue to monitor  Elevated blood pressure reading Diastolic blood pressures were noted to be elevated up to 112.  Possibly related with pain.  Patient not on any blood pressure medications at baseline. -Hydralazine IV as needed   DVT prophylaxis: Lovenox  Advance Care Planning:   Code Status: Full Code   Consults:   Family Communication: None requested.  Severity of Illness: Patient requires inpatient status due to need of aggressive IV fluid rehydration.   Author: Norval Morton, MD 07/20/2022 8:40 AM  For on call review [email or web address].

## 2022-07-21 ENCOUNTER — Ambulatory Visit: Payer: Self-pay | Admitting: Internal Medicine

## 2022-07-21 DIAGNOSIS — K861 Other chronic pancreatitis: Secondary | ICD-10-CM

## 2022-07-21 DIAGNOSIS — E1065 Type 1 diabetes mellitus with hyperglycemia: Secondary | ICD-10-CM

## 2022-07-21 DIAGNOSIS — K859 Acute pancreatitis without necrosis or infection, unspecified: Principal | ICD-10-CM

## 2022-07-21 DIAGNOSIS — I81 Portal vein thrombosis: Secondary | ICD-10-CM

## 2022-07-21 DIAGNOSIS — D649 Anemia, unspecified: Secondary | ICD-10-CM

## 2022-07-21 LAB — CBC
HCT: 36.8 % — ABNORMAL LOW (ref 39.0–52.0)
Hemoglobin: 12.4 g/dL — ABNORMAL LOW (ref 13.0–17.0)
MCH: 33.5 pg (ref 26.0–34.0)
MCHC: 33.7 g/dL (ref 30.0–36.0)
MCV: 99.5 fL (ref 80.0–100.0)
Platelets: 145 10*3/uL — ABNORMAL LOW (ref 150–400)
RBC: 3.7 MIL/uL — ABNORMAL LOW (ref 4.22–5.81)
RDW: 13.1 % (ref 11.5–15.5)
WBC: 4 10*3/uL (ref 4.0–10.5)
nRBC: 0 % (ref 0.0–0.2)

## 2022-07-21 LAB — BASIC METABOLIC PANEL
Anion gap: 6 (ref 5–15)
BUN: <5 mg/dL — ABNORMAL LOW (ref 6–20)
CO2: 27 mmol/L (ref 22–32)
Calcium: 9.1 mg/dL (ref 8.9–10.3)
Chloride: 102 mmol/L (ref 98–111)
Creatinine, Ser: 0.8 mg/dL (ref 0.61–1.24)
GFR, Estimated: >60 mL/min (ref >60)
Glucose, Bld: 163 mg/dL — ABNORMAL HIGH (ref 70–99)
Potassium: 3.8 mmol/L (ref 3.5–5.1)
Sodium: 135 mmol/L (ref 135–145)

## 2022-07-21 LAB — GLUCOSE, CAPILLARY
Glucose-Capillary: 166 mg/dL — ABNORMAL HIGH (ref 70–99)
Glucose-Capillary: 185 mg/dL — ABNORMAL HIGH (ref 70–99)
Glucose-Capillary: 142 mg/dL — ABNORMAL HIGH (ref 70–99)
Glucose-Capillary: 204 mg/dL — ABNORMAL HIGH (ref 70–99)
Glucose-Capillary: 237 mg/dL — ABNORMAL HIGH (ref 70–99)

## 2022-07-21 NOTE — Hospital Course (Addendum)
36 year old man PMH including diabetes mellitus, pancreatitis, portal vein thrombosis anticoagulation, chronic pain with opioid dependence, alcohol abuse in remission presented with 3-week history of progressively worsening epigastric abdominal pain.

## 2022-07-21 NOTE — Progress Notes (Signed)
  Progress Note   Patient: Carlos [name] [id number]:[id number] DOB: 09-26-1986 DOA: 07/19/2022     1 DOS: the patient was seen and examined on 07/21/2022   Brief hospital course: 36 year old man PMH including diabetes mellitus, pancreatitis, portal vein thrombosis anticoagulation, chronic pain with opioid dependence, alcohol abuse in remission presented with 3-week history of progressively worsening epigastric abdominal pain.  Assessment and Plan: Recurrent acute on chronic pancreatitis followed by Dr. [name].  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  Plan for ERCP with stent placement per Dr. [name] October 5. -- CT showed acute pancreatitis, chronic pancreatitis, chronic portal vein thrombosis with cavernous transformation. -- Continue Creon, pain control, IV fluids, antiemetics; no significant change in condition.  Will consult GI for further recommendations.  Wonder if any possibility to perform procedure sooner.  Diabetes mellitus type 1 with hyperglycemia with glucose 413 on admission.  Hemoglobin A1c last 7.0 -- Can resume insulin pump monitor closely  Chronic portal vein thrombosis diagnosed March 2023 -- Apparently off apixaban for some time now, if no procedure would resume but needs to be a 48 hours prior to procedure.   Normocytic anemia -- Minimal.  Follow-up as an outpatient.   Elevated blood pressure reading secondary to pain  Alcohol abuse in remission for 3 years     Subjective:  Feels about the same, still has 8/10 pain in the epigastrium.  Tolerating some liquids.  Physical Exam: Vitals:   07/20/22 1920 07/20/22 2323 07/21/22 0500 07/21/22 0754  BP: (!) 147/92 (!) 138/93 (!) 141/97 138/89  Pulse: 71 77 81 82  Resp: 18 16 16 18   Temp: 98.2 F (36.8 C) 98.6 F (37 C) 98.4 F (36.9 C) 98.4 F (36.9 C)  TempSrc: Oral Oral Oral Oral  SpO2: 100% 100% 100% 100%   Weight:      Height:       Physical Exam Vitals reviewed.  Constitutional:      General: He is not in acute distress.    Appearance: He is not ill-appearing or toxic-appearing.  Cardiovascular:     Rate and Rhythm: Normal rate and regular rhythm.     Heart sounds: No murmur heard. Pulmonary:     Effort: Pulmonary effort is normal. No respiratory distress.     Breath sounds: No wheezing, rhonchi or rales.  Abdominal:     General: Abdomen is flat.     Tenderness: There is abdominal tenderness (epigastric).  Musculoskeletal:     Right lower leg: No edema.     Left lower leg: No edema.  Neurological:     Mental Status: He is alert.  Psychiatric:        Mood and Affect: Mood normal.        Behavior: Behavior normal.     Data Reviewed:  CBG stable BMP noted Plts 145 Hgb A1c 7   Family Communication:   Disposition: Status is: Inpatient Remains inpatient appropriate because: acute pancreatitis  Planned Discharge Destination: Home    Time spent: 20 minutes  Author: , MD 07/21/2022 12:15 PM  For on call review [email or web address].

## 2022-07-22 ENCOUNTER — Other Ambulatory Visit: Payer: Self-pay | Admitting: Family Medicine

## 2022-07-22 DIAGNOSIS — K860 Alcohol-induced chronic pancreatitis: Secondary | ICD-10-CM

## 2022-07-22 DIAGNOSIS — G8929 Other chronic pain: Secondary | ICD-10-CM

## 2022-07-22 LAB — PHOSPHORUS: Phosphorus: 4 mg/dL (ref 2.5–4.6)

## 2022-07-22 LAB — BASIC METABOLIC PANEL
Anion gap: 9 (ref 5–15)
BUN: <5 mg/dL — ABNORMAL LOW (ref 6–20)
CO2: 30 mmol/L (ref 22–32)
Calcium: 9.9 mg/dL (ref 8.9–10.3)
Chloride: 99 mmol/L (ref 98–111)
Creatinine, Ser: 0.93 mg/dL (ref 0.61–1.24)
GFR, Estimated: >60 mL/min (ref >60)
Glucose, Bld: 148 mg/dL — ABNORMAL HIGH (ref 70–99)
Potassium: 4.4 mmol/L (ref 3.5–5.1)
Sodium: 138 mmol/L (ref 135–145)

## 2022-07-22 LAB — GLUCOSE, CAPILLARY
Glucose-Capillary: 214 mg/dL — ABNORMAL HIGH (ref 70–99)
Glucose-Capillary: 222 mg/dL — ABNORMAL HIGH (ref 70–99)
Glucose-Capillary: 148 mg/dL — ABNORMAL HIGH (ref 70–99)

## 2022-07-22 LAB — MAGNESIUM: Magnesium: 1.9 mg/dL (ref 1.7–2.4)

## 2022-07-22 MED ORDER — ACETAMINOPHEN 325 MG PO TABS: 650.0000 mg | ORAL_TABLET | Freq: Four times a day (QID) | ORAL | Status: DC | PRN

## 2022-07-22 NOTE — Plan of Care (Signed)

## 2022-07-22 NOTE — Discharge Summary (Signed)
Physician Discharge Summary   Patient: Carlos [name] MRN: [id number] DOB: 1986-03-13  Admit date:     07/19/2022  Discharge date: 07/22/22  Discharge Physician: Murray Hodgkins   PCP: [name], MD   Recommendations at discharge:    Acute on chronic pancreatitis  Discharge Diagnoses: Principal Problem:   Acute on chronic pancreatitis [facility]) Active Problems:   Uncontrolled type 1 diabetes mellitus with hyperglycemia, with long-term current use of insulin (HCC)   Portal vein thrombosis   Hyperbilirubinemia   Normocytic anemia   Elevated blood pressure reading  Resolved Problems:   * No resolved hospital problems. *  Hospital Course: 36 year old man PMH including diabetes mellitus, pancreatitis, portal vein thrombosis anticoagulation, chronic pain with opioid dependence, alcohol abuse in remission presented with 3-week history of progressively worsening epigastric abdominal pain.  Admitted for acute on chronic pancreatitis.  Gradually improved.  Hospitalization uncomplicated.  Plan outpatient follow-up with GI as already arranged.  Recurrent acute on chronic pancreatitis followed by Dr. Rush Landmark.  Recently referred to [facility] for second opinion smoldering pancreatitis; per that consult etiology was thought to be pancreatic duct stricture.  Plan for ERCP with stent placement per Dr. Rush Landmark October 5. -- CT showed acute pancreatitis, chronic pancreatitis, chronic portal vein thrombosis with cavernous transformation. -- Continue Creon.  Tolerating diet.  Pain improved.  Plan for discharge home.  Unfortunately no availability to move procedure up.   Diabetes mellitus type 1 with hyperglycemia with glucose 413 on admission.  Hemoglobin A1c last 7.0 -- Continue insulin pump on discharge.   Chronic portal vein thrombosis diagnosed March 2023 -- Discussed with patient to resume apixaban but stopped 48 hours prior to procedure.   Normocytic anemia -- Minimal.  Follow-up as an  outpatient.   Alcohol abuse in remission for 3 years     Pain control - [place] Controlled Substance Reporting System database was reviewed.   Consultants:  GI informally only Procedures performed:  None   Disposition: Home Diet recommendation:  Discharge Diet Orders (From admission, onward)     Start     Ordered   07/22/22 0000  Diet - low sodium heart healthy        07/22/22 1524           Low fat diet DISCHARGE MEDICATION: Allergies as of 07/22/2022       Reactions   Other Anaphylaxis   [name] - anaphylaxis        Medication List     STOP taking these medications    insulin glargine 100 UNIT/ML Solostar Pen Commonly known as: LANTUS   naproxen sodium 220 MG tablet Commonly known as: ALEVE       TAKE these medications    acetaminophen 325 MG tablet Commonly known as: TYLENOL Take 2 tablets (650 mg total) by mouth every 6 (six) hours as needed for mild pain (or Fever >/= 101).   ADVANCED DIABETIC MULTIVITAMIN PO Take 1 tablet by mouth daily.   apixaban 5 MG Tabs tablet Commonly known as: Eliquis Take 1 tablet (5 mg total) by mouth 2 (two) times daily.   Creon 36000 UNITS Cpep capsule Generic drug: lipase/protease/amylase TAKE 2 CAPSULES BY MOUTH THREE TIMES DAILY WITH MEALS MAY ALSO TAKE 1 CAPSULE AS NEEDED WITH SNACKS What changed: See the new instructions.   diphenhydramine-acetaminophen 25-500 MG Tabs tablet Commonly known as: TYLENOL PM Take 2 tablets by mouth at bedtime as needed (sleep, pain).   HYDROmorphone 4 MG tablet Commonly known as: Dilaudid Take  1 tablet (4 mg total) by mouth every 4 (four) hours as needed for severe pain.   insulin aspart 100 UNIT/ML injection Commonly known as: novoLOG Inject 120-150 Units into the skin See admin instructions. 120-150 units into insulin pump every 3 days   omeprazole 40 MG capsule Commonly known as: PRILOSEC Take 1 capsule (40 mg total) by mouth daily.   Omnipod 5 G6 Intro  (Gen 5) Kit 1 Device by Does not apply route every 3 (three) days.   ondansetron 8 MG tablet Commonly known as: Zofran Take 1 tablet (8 mg total) by mouth every 8 (eight) hours as needed for nausea or vomiting.        Follow-up Information     [name], MD Follow up on 07/26/2022.   Specialties: Family Medicine, Emergency Medicine Contact information: [place] [address] [phone]                Feels better Less pain Tolerating liquids Would like to advance diet  Discharge Exam: Filed Weights   07/19/22 1841 07/22/22 0500  Weight: 73 kg 73 kg   Physical Exam Vitals reviewed.  Constitutional:      General: He is not in acute distress.    Appearance: He is not ill-appearing or toxic-appearing.  Cardiovascular:     Rate and Rhythm: Normal rate and regular rhythm.     Heart sounds: No murmur heard. Pulmonary:     Effort: Pulmonary effort is normal. No respiratory distress.     Breath sounds: No wheezing, rhonchi or rales.  Abdominal:     Palpations: Abdomen is soft.     Tenderness: There is no abdominal tenderness.  Neurological:     Mental Status: He is alert.  Psychiatric:        Mood and Affect: Mood normal.        Behavior: Behavior normal.   Condition at discharge: good  The results of significant diagnostics from this hospitalization (including imaging, microbiology, ancillary and laboratory) are listed below for reference.   Imaging Studies: CT ABDOMEN PELVIS W CONTRAST  Result Date: 07/19/2022 CLINICAL DATA:  Acute abdominal pain. EXAM: CT ABDOMEN AND PELVIS WITH CONTRAST TECHNIQUE: Multidetector CT imaging of the abdomen and pelvis was performed using the standard protocol following bolus administration of intravenous contrast. RADIATION DOSE REDUCTION: This exam was performed according to the departmental dose-optimization program which includes automated exposure control, adjustment of the mA and/or kV according to  patient size and/or use of iterative reconstruction technique. CONTRAST:  185m OMNIPAQUE IOHEXOL 300 MG/ML  SOLN COMPARISON:  CT abdomen and pelvis 04/23/2022 FINDINGS: Lower chest: No acute abnormality. Hepatobiliary: No focal liver abnormality is seen. No gallstones, gallbladder wall thickening, or biliary dilatation. Pancreas: Pancreatic calcifications are present compatible with chronic pancreatitis. There is some fluid and stranding surrounding the head of the pancreas. Pancreatic duct is mildly prominent, unchanged. No peripancreatic organized fluid collections are seen. Spleen: Normal in size without focal abnormality. Adrenals/Urinary Tract: Adrenal glands are unremarkable. Kidneys are normal, without renal calculi, focal lesion, or hydronephrosis. Bladder is unremarkable. Stomach/Bowel: Stomach is within normal limits. Appendix appears normal. No evidence of bowel wall thickening, distention, or inflammatory changes. Vascular/Lymphatic: Numerous vessels are seen in the region of the porta hepatis similar to the prior study. Aorta and IVC are normal in size. Can not exclude area of portal vein thrombosis image 3/26. this appears similar to the prior study. No enlarged lymph nodes are seen. Reproductive: Prostate is unremarkable.  Other: No abdominal wall hernia or abnormality. No abdominopelvic ascites. Scant Musculoskeletal: No acute or significant osseous findings. IMPRESSION: 1. Fluid and edema of the pancreatic head compatible with acute pancreatitis. No focal fluid collections. 2. Pancreatic calcifications and ductal prominence appears similar to the prior study likely related to chronic pancreatitis. 3. Again seen are changes of chronic portal vein thrombosis with cavernous transformation. Electronically Signed   By: Ronney Asters M.D.   On: 07/19/2022 22:18    Microbiology: Results for orders placed or performed during the hospital encounter of 01/30/22  Resp Panel by RT-PCR (Flu A&B, Covid)  Nasopharyngeal Swab     Status: None   Collection Time: 01/30/22  4:57 PM   Specimen: Nasopharyngeal Swab; Nasopharyngeal(NP) swabs in vial transport medium  Result Value Ref Range Status   SARS Coronavirus 2 by RT PCR NEGATIVE NEGATIVE Final    Comment: (NOTE) SARS-CoV-2 target nucleic acids are NOT DETECTED.  The SARS-CoV-2 RNA is generally detectable in upper respiratory specimens during the acute phase of infection. The lowest concentration of SARS-CoV-2 viral copies this assay can detect is 138 copies/mL. A negative result does not preclude SARS-Cov-2 infection and should not be used as the sole basis for treatment or other patient management decisions. A negative result may occur with  improper specimen collection/handling, submission of specimen other than nasopharyngeal swab, presence of viral mutation(s) within the areas targeted by this assay, and inadequate number of viral copies(<138 copies/mL). A negative result must be combined with clinical observations, patient history, and epidemiological information. The expected result is Negative.  Fact Sheet for Patients:  EntrepreneurPulse.com.au  Fact Sheet for Healthcare Providers:  IncredibleEmployment.be  This test is no t yet approved or cleared by the [place] FDA and  has been authorized for detection and/or diagnosis of SARS-CoV-2 by FDA under an Emergency Use Authorization (EUA). This EUA will remain  in effect (meaning this test can be used) for the duration of the COVID-19 declaration under Section 564(b)(1) of the Act, 21 U.S.C.section 360bbb-3(b)(1), unless the authorization is terminated  or revoked sooner.       Influenza A by PCR NEGATIVE NEGATIVE Final   Influenza B by PCR NEGATIVE NEGATIVE Final    Comment: (NOTE) The Xpert Xpress SARS-CoV-2/FLU/RSV plus assay is intended as an aid in the diagnosis of influenza from Nasopharyngeal swab specimens and should not be  used as a sole basis for treatment. Nasal washings and aspirates are unacceptable for Xpert Xpress SARS-CoV-2/FLU/RSV testing.  Fact Sheet for Patients: EntrepreneurPulse.com.au  Fact Sheet for Healthcare Providers: IncredibleEmployment.be  This test is not yet approved or cleared by the [place] FDA and has been authorized for detection and/or diagnosis of SARS-CoV-2 by FDA under an Emergency Use Authorization (EUA). This EUA will remain in effect (meaning this test can be used) for the duration of the COVID-19 declaration under Section 564(b)(1) of the Act, 21 U.S.C. section 360bbb-3(b)(1), unless the authorization is terminated or revoked.  Performed at [facility], [place] [address], [place], [place] [address]   Gastrointestinal Panel by PCR , Stool     Status: None   Collection Time: 01/30/22 10:12 PM   Specimen: Stool  Result Value Ref Range Status   Campylobacter species NOT DETECTED NOT DETECTED Final   Plesimonas shigelloides NOT DETECTED NOT DETECTED Final   Salmonella species NOT DETECTED NOT DETECTED Final   Yersinia enterocolitica NOT DETECTED NOT DETECTED Final   Vibrio species NOT DETECTED NOT DETECTED Final   Vibrio  cholerae NOT DETECTED NOT DETECTED Final   Enteroaggregative E coli (EAEC) NOT DETECTED NOT DETECTED Final   Enteropathogenic E coli (EPEC) NOT DETECTED NOT DETECTED Final   Enterotoxigenic E coli (ETEC) NOT DETECTED NOT DETECTED Final   Shiga like toxin producing E coli (STEC) NOT DETECTED NOT DETECTED Final   Shigella/Enteroinvasive E coli (EIEC) NOT DETECTED NOT DETECTED Final   Cryptosporidium NOT DETECTED NOT DETECTED Final   Cyclospora cayetanensis NOT DETECTED NOT DETECTED Final   Entamoeba histolytica NOT DETECTED NOT DETECTED Final   Giardia lamblia NOT DETECTED NOT DETECTED Final   Adenovirus F40/41 NOT DETECTED NOT DETECTED Final   Astrovirus NOT DETECTED NOT DETECTED Final    Norovirus GI/GII NOT DETECTED NOT DETECTED Final   Rotavirus A NOT DETECTED NOT DETECTED Final   Sapovirus (I, II, IV, and V) NOT DETECTED NOT DETECTED Final    Comment: Performed at [facility], [address] [place], [place] [address]    Labs: CBC: Recent Labs  Lab 07/19/22 1854 07/20/22 0215 07/21/22 0400  WBC 4.5 4.5 4.0  NEUTROABS  --  2.8  --   HGB 13.3 12.8* 12.4*  HCT 39.3 39.3 36.8*  MCV 100.0 102.9* 99.5  PLT 182 173 322*   Basic Metabolic Panel: Recent Labs  Lab 07/19/22 1854 07/20/22 0215 07/21/22 0400 07/22/22 0541  NA 132* 135 135 138  K 3.6 3.8 3.8 4.4  CL 98 102 102 99  CO2 '24 26 27 30  ' GLUCOSE 413* 255* 163* 148*  BUN 9 6 <5* <5*  CREATININE 1.16 0.93 0.80 0.93  CALCIUM 9.1 8.7* 9.1 9.9  MG  --  1.7  --  1.9  PHOS  --   --   --  4.0   Liver Function Tests: Recent Labs  Lab 07/19/22 1854 07/20/22 0215  AST 19 19  ALT 14 15  ALKPHOS 81 77  BILITOT 1.4* 1.3*  PROT 7.2 6.6  ALBUMIN 4.0 3.6   CBG: Recent Labs  Lab 07/21/22 1131 07/21/22 1631 07/21/22 2154 07/22/22 0444 07/22/22 0830  GLUCAP 204* 237* 214* 222* 148*    Discharge time spent: greater than 30 minutes.  Signed: Murray Hodgkins, MD [facility] 07/22/2022

## 2022-07-22 NOTE — Plan of Care (Signed)

## 2022-07-23 ENCOUNTER — Other Ambulatory Visit: Payer: Self-pay | Admitting: Family Medicine

## 2022-07-23 DIAGNOSIS — G8929 Other chronic pain: Secondary | ICD-10-CM

## 2022-07-23 DIAGNOSIS — K860 Alcohol-induced chronic pancreatitis: Secondary | ICD-10-CM

## 2022-07-23 NOTE — Telephone Encounter (Signed)
Please read attached pharmacy note from patient. Message routed to Richarda Blade, NP due to PCP Hyacinth Meeker Bertram Millard, MD being out of office.

## 2022-07-23 NOTE — Telephone Encounter (Signed)
PCP [name], MD is out of office. Please Advise.

## 2022-07-26 ENCOUNTER — Other Ambulatory Visit: Payer: Self-pay | Admitting: Family Medicine

## 2022-07-26 DIAGNOSIS — G8929 Other chronic pain: Secondary | ICD-10-CM

## 2022-07-26 DIAGNOSIS — K860 Alcohol-induced chronic pancreatitis: Secondary | ICD-10-CM

## 2022-07-26 MED ORDER — HYDROMORPHONE HCL 4 MG PO TABS: 4.0000 mg | ORAL_TABLET | ORAL | 0 refills | Status: DC | PRN

## 2022-07-26 NOTE — Telephone Encounter (Signed)
Duplicate. Medication refilled.

## 2022-07-26 NOTE — Telephone Encounter (Signed)
Duplicate. Medication refilled.  

## 2022-07-26 NOTE — Telephone Encounter (Signed)
Medication refilled

## 2022-07-26 NOTE — Telephone Encounter (Signed)
RX last refilled on 06/22/2022.   No treatment agreement on file, neither does patient have a pending appointment with [facility].   Call placed to patient, patient stated he would like to schedule a follow-up with Dinah as Dr.Miller's next available appointment is not until mid-late October 2023. Notation made on pending appointment for this Wednesday with Dinah for patient to sign a treatment agreement.

## 2022-07-27 ENCOUNTER — Telehealth: Payer: Self-pay

## 2022-07-27 NOTE — Telephone Encounter (Signed)
Transition Care Management Follow-up Telephone Call Date of discharge and from where: [facility] 07/[facility]/2022 How have you been since you were released from the hospital? Better  Any questions or concerns? No  Items Reviewed: Did the pt receive and understand the discharge instructions provided? Yes  Medications obtained and verified? Yes  Other? No  Any new allergies since your discharge? No  Dietary orders reviewed? Yes Do you have support at home? Yes   Home Care and Equipment/Supplies: Were home health services ordered? not applicable If so, what is the name of the agency? N/A  Has the agency set up a time to come to the patient's home? not applicable Were any new equipment or medical supplies ordered?  No What is the name of the medical supply agency? N/A Were you able to get the supplies/equipment? not applicable Do you have any questions related to the use of the equipment or supplies? No  Functional Questionnaire: (I = Independent and D = Dependent) ADLs: I  Bathing/Dressing- I  Meal Prep- I  Eating- I  Maintaining continence- I  Transferring/Ambulation- I  Managing Meds- I  Follow up appointments reviewed:  PCP Hospital f/u appt confirmed? Yes  Scheduled to see Marlowe Sax, NP on 07/28/2022  Specialist Hospital f/u appt confirmed? No  Scheduled to see N/A on N/A @ N/A. Are transportation arrangements needed? No  If their condition worsens, is the pt aware to call PCP or go to the Emergency Dept.? Yes Was the patient provided with contact information for the PCP's office or ED? Yes Was to pt encouraged to call back with questions or concerns? Yes

## 2022-07-28 ENCOUNTER — Encounter: Payer: Self-pay | Admitting: Family

## 2022-07-28 LAB — GLUCOSE, CAPILLARY: Glucose-Capillary: 156 mg/dL — ABNORMAL HIGH (ref 70–99)

## 2022-08-02 ENCOUNTER — Ambulatory Visit: Payer: Managed Care, Other (non HMO) | Admitting: Family

## 2022-08-02 ENCOUNTER — Encounter: Payer: Self-pay | Admitting: Family

## 2022-08-02 VITALS — BP 140/80 | HR 71 | Temp 97.5°F | Resp 18 | Ht 77.0 in | Wt 169.0 lb

## 2022-08-02 DIAGNOSIS — E1065 Type 1 diabetes mellitus with hyperglycemia: Secondary | ICD-10-CM | POA: Diagnosis not present

## 2022-08-02 DIAGNOSIS — F1020 Alcohol dependence, uncomplicated: Secondary | ICD-10-CM

## 2022-08-02 DIAGNOSIS — D649 Anemia, unspecified: Secondary | ICD-10-CM | POA: Diagnosis not present

## 2022-08-02 DIAGNOSIS — K860 Alcohol-induced chronic pancreatitis: Secondary | ICD-10-CM | POA: Diagnosis not present

## 2022-08-02 NOTE — Progress Notes (Signed)
Provider: Bertha Earwood FNP-C   [name], MD  Patient Care Team: [name], MD as PCP - General (Family Medicine) Jodi Marble, MD as Consulting Physician (Otolaryngology)  Extended Emergency Contact Information Primary Emergency Contact: brathwaite,naketa Address: [place], [place] [address] [name] of [place] Phone: [phone] Work Phone: [phone] Mobile Phone: [phone] Relation: Significant other  Code Status:  Full Code  Goals of care: Advanced Directive information    07/19/2022    6:41 PM  Advanced Directives  Does Patient Have a Medical Advance Directive? No  Would patient like information on creating a medical advance directive? No - Patient declined     Chief Complaint  Patient presents with   Follow-up    Patient is here for a 4 week follow up after hospitalization    HPI:  Pt is a 36 y.o. male seen today for  transition of care post hospitalization for acute on chronic pancreatitis. Discharged home to follow up with GI states feeling much better since hospital discharge.Took his dilaudid this morning rates pain 7/10 on scale.Has upcoming ERCP procedure scheduled for 08/12/2022.  Diabetes Mellitus type 1 - Hgb A1C 7.0 blood sugars were in the 400's during Hospitalization.Home CBG down in the 100's per patient . Appetite described as good.has gain weight 5 lbs.   Chronic portal vein thrombosis Dx [id number]  - on apixaban   Normocytic Anemia - Hgb 12.4 denies any dizziness or fatigue.  Alcholol abuse in remission x 3 yrs    Past Medical History:  Diagnosis Date   AKI (acute kidney injury) ([place]) 12/21/2021   Anxiety    Diabetes ([place])    Pancreatitis    Splenic vein thrombosis    Past Surgical History:  Procedure Laterality Date   BIOPSY  12/02/2021   Procedure: BIOPSY;  Surgeon: Irving Copas., MD;  Location: [facility];  Service: Gastroenterology;;   ESOPHAGOGASTRODUODENOSCOPY  (EGD) WITH PROPOFOL N/A 12/02/2021   Procedure: ESOPHAGOGASTRODUODENOSCOPY (EGD) WITH PROPOFOL;  Surgeon: Irving Copas., MD;  Location: [facility];  Service: Gastroenterology;  Laterality: N/A;   NO PAST SURGERIES     UPPER ESOPHAGEAL ENDOSCOPIC ULTRASOUND (EUS) N/A 12/02/2021   Procedure: UPPER ESOPHAGEAL ENDOSCOPIC ULTRASOUND (EUS);  Surgeon: Irving Copas., MD;  Location: [facility];  Service: Gastroenterology;  Laterality: N/A;    Allergies  Allergen Reactions   Other Anaphylaxis    [name] - anaphylaxis    Allergies as of 08/02/2022       Reactions   Other Anaphylaxis   [name] - anaphylaxis        Medication List        Accurate as of August 02, 2022  8:55 AM. If you have any questions, ask your nurse or doctor.          STOP taking these medications    apixaban 5 MG Tabs tablet Commonly known as: Eliquis Stopped by: Sandrea Hughs, NP       TAKE these medications    acetaminophen 325 MG tablet Commonly known as: TYLENOL Take 2 tablets (650 mg total) by mouth every 6 (six) hours as needed for mild pain (or Fever >/= 101).   ADVANCED DIABETIC MULTIVITAMIN PO Take 1 tablet by mouth daily.   Creon 36000 UNITS Cpep capsule Generic drug: lipase/protease/amylase TAKE 2 CAPSULES BY MOUTH THREE TIMES DAILY WITH MEALS MAY ALSO TAKE 1 CAPSULE AS NEEDED WITH SNACKS   diphenhydramine-acetaminophen 25-500  MG Tabs tablet Commonly known as: TYLENOL PM Take 2 tablets by mouth at bedtime as needed (sleep, pain).   HYDROmorphone 4 MG tablet Commonly known as: Dilaudid Take 1 tablet (4 mg total) by mouth every 4 (four) hours as needed for severe pain.   insulin aspart 100 UNIT/ML injection Commonly known as: novoLOG Inject 120-150 Units into the skin See admin instructions. 120-150 units into insulin pump every 3 days   omeprazole 40 MG capsule Commonly known as: PRILOSEC Take 1 capsule (40 mg total) by mouth daily.   Omnipod 5  G6 Intro (Gen 5) Kit 1 Device by Does not apply route every 3 (three) days.   ondansetron 8 MG tablet Commonly known as: Zofran Take 1 tablet (8 mg total) by mouth every 8 (eight) hours as needed for nausea or vomiting.        Review of Systems  Constitutional:  Negative for appetite change, chills, fatigue, fever and unexpected weight change.  Respiratory:  Negative for cough, chest tightness, shortness of breath and wheezing.   Cardiovascular:  Negative for chest pain, palpitations and leg swelling.  Gastrointestinal:  Positive for abdominal pain. Negative for abdominal distention, blood in stool, constipation, diarrhea, nausea and vomiting.  Endocrine: Negative for cold intolerance, heat intolerance, polydipsia, polyphagia and polyuria.  Genitourinary:  Negative for difficulty urinating, dysuria, flank pain, frequency and urgency.  Musculoskeletal:  Negative for arthralgias, back pain, gait problem, joint swelling, myalgias, neck pain and neck stiffness.  Skin:  Negative for color change, pallor, rash and wound.  Neurological:  Negative for dizziness, syncope, speech difficulty, weakness, light-headedness, numbness and headaches.  Hematological:  Does not bruise/bleed easily.  Psychiatric/Behavioral:  Negative for agitation, behavioral problems, confusion, hallucinations, self-injury, sleep disturbance and suicidal ideas. The patient is not nervous/anxious.     Immunization History  Administered Date(s) Administered   Influenza,inj,Quad PF,6+ Mos 09/15/2021   Pertinent  Health Maintenance Due  Topic Date Due   OPHTHALMOLOGY EXAM  Never done   INFLUENZA VACCINE  06/08/2022   HEMOGLOBIN A1C  01/18/2023   FOOT EXAM  03/11/2023      07/20/2022    3:00 PM 07/20/2022   10:00 PM 07/21/2022    5:00 PM 07/21/2022    8:00 PM 07/22/2022    8:00 AM  Fall Risk  Patient Fall Risk Level Moderate fall risk Moderate fall risk Moderate fall risk Moderate fall risk Low fall risk   Functional  Status Survey:    Vitals:   08/02/22 0848  BP: (!) 140/80  Pulse: 71  Resp: 18  Temp: (!) 97.5 F (36.4 C)  TempSrc: Temporal  SpO2: 96%  Weight: 169 lb (76.7 kg)  Height: _0  (1.956 m)   Body mass index is 20.04 kg/m. Physical Exam Vitals reviewed.  Constitutional:      General: He is not in acute distress.    Appearance: Normal appearance. He is normal weight. He is not ill-appearing or diaphoretic.  HENT:     Head: Normocephalic.     Right Ear: Tympanic membrane, ear canal and external ear normal. There is no impacted cerumen.     Left Ear: Tympanic membrane, ear canal and external ear normal. There is no impacted cerumen.     Nose: Nose normal. No congestion or rhinorrhea.     Mouth/Throat:     Mouth: Mucous membranes are moist.     Pharynx: Oropharynx is clear. No oropharyngeal exudate or posterior oropharyngeal erythema.  Eyes:     General: No  scleral icterus.       Right eye: No discharge.        Left eye: No discharge.     Extraocular Movements: Extraocular movements intact.     Conjunctiva/sclera: Conjunctivae normal.     Pupils: Pupils are equal, round, and reactive to light.  Neck:     Vascular: No carotid bruit.  Cardiovascular:     Rate and Rhythm: Normal rate and regular rhythm.     Pulses: Normal pulses.     Heart sounds: Normal heart sounds. No murmur heard.    No friction rub. No gallop.  Pulmonary:     Effort: Pulmonary effort is normal. No respiratory distress.     Breath sounds: Normal breath sounds. No wheezing, rhonchi or rales.  Chest:     Chest wall: No tenderness.  Abdominal:     General: Bowel sounds are normal. There is no distension.     Palpations: Abdomen is soft. There is no mass.     Tenderness: There is abdominal tenderness in the epigastric area. There is no right CVA tenderness, left CVA tenderness, guarding or rebound.  Musculoskeletal:        General: No swelling or tenderness. Normal range of motion.     Cervical back:  Normal range of motion. No rigidity or tenderness.     Right lower leg: No edema.     Left lower leg: No edema.  Lymphadenopathy:     Cervical: No cervical adenopathy.  Skin:    General: Skin is warm and dry.     Coloration: Skin is not pale.     Findings: No bruising, erythema, lesion or rash.  Neurological:     Mental Status: He is alert and oriented to person, place, and time.     Cranial Nerves: No cranial nerve deficit.     Sensory: No sensory deficit.     Motor: No weakness.     Coordination: Coordination normal.     Gait: Gait normal.  Psychiatric:        Mood and Affect: Mood normal.        Speech: Speech normal.        Behavior: Behavior normal.        Thought Content: Thought content normal.        Judgment: Judgment normal.     Labs reviewed: Recent Labs    09/16/21 0447 10/09/21 1525 12/21/21 0342 12/21/21 0900 04/28/22 0308 04/29/22 0312 07/20/22 0215 07/21/22 0400 07/22/22 0541  NA 129*   < > 125*   < > 136   < > 135 135 138  K 3.8   < > 4.3   < > 3.0*   < > 3.8 3.8 4.4  CL 94*   < > 82*   < > 99   < > 102 102 99  CO2 28   < > 27   < > 26   < > _0 GLUCOSE 231*   < > 776*   < > 162*   < > 255* 163* 148*  BUN 14   < > 20   < > 6   < > 6 <5* <5*  CREATININE 0.55*   < > 1.17   < > 0.71   < > 0.93 0.80 0.93  CALCIUM 8.5*   < > 10.5*   < > 8.8*   < > 8.7* 9.1 9.9  MG 2.0   < > 3.0*   < > 1.8  --  1.7  --  1.9  PHOS 3.6  --  5.8*  --   --   --   --   --  4.0   < > = values in this interval not displayed.   Recent Labs    04/27/22 0534 07/19/22 1854 07/20/22 0215  AST 11* 19 19  ALT _0 ALKPHOS 68 81 77  BILITOT 0.8 1.4* 1.3*  PROT 6.8 7.2 6.6  ALBUMIN 3.3* 4.0 3.6   Recent Labs    04/26/22 1742 04/27/22 0534 04/28/22 0308 07/19/22 1854 07/20/22 0215 07/21/22 0400  WBC 7.3 6.2   < > 4.5 4.5 4.0  NEUTROABS 5.9 4.6  --   --  2.8  --   HGB 13.8 12.4*   < > 13.3 12.8* 12.4*  HCT 41.6 36.6*   < > 39.3 39.3 36.8*  MCV 98.6 95.8   <  > 100.0 102.9* 99.5  PLT 209 179   < > 182 173 145*   < > = values in this interval not displayed.   Lab Results  Component Value Date   TSH 0.81 03/10/2022   Lab Results  Component Value Date   HGBA1C 7.0 (H) 07/20/2022   Lab Results  Component Value Date   CHOL 104 03/10/2022   HDL 56.50 03/10/2022   LDLCALC 18 03/10/2022   TRIG 153 (H) 07/20/2022   CHOLHDL 2 03/10/2022    Significant Diagnostic Results in last 30 days:  CT ABDOMEN PELVIS W CONTRAST  Result Date: 07/19/2022 CLINICAL DATA:  Acute abdominal pain. EXAM: CT ABDOMEN AND PELVIS WITH CONTRAST TECHNIQUE: Multidetector CT imaging of the abdomen and pelvis was performed using the standard protocol following bolus administration of intravenous contrast. RADIATION DOSE REDUCTION: This exam was performed according to the departmental dose-optimization program which includes automated exposure control, adjustment of the mA and/or kV according to patient size and/or use of iterative reconstruction technique. CONTRAST:  195m OMNIPAQUE IOHEXOL 300 MG/ML  SOLN COMPARISON:  CT abdomen and pelvis 04/23/2022 FINDINGS: Lower chest: No acute abnormality. Hepatobiliary: No focal liver abnormality is seen. No gallstones, gallbladder wall thickening, or biliary dilatation. Pancreas: Pancreatic calcifications are present compatible with chronic pancreatitis. There is some fluid and stranding surrounding the head of the pancreas. Pancreatic duct is mildly prominent, unchanged. No peripancreatic organized fluid collections are seen. Spleen: Normal in size without focal abnormality. Adrenals/Urinary Tract: Adrenal glands are unremarkable. Kidneys are normal, without renal calculi, focal lesion, or hydronephrosis. Bladder is unremarkable. Stomach/Bowel: Stomach is within normal limits. Appendix appears normal. No evidence of bowel wall thickening, distention, or inflammatory changes. Vascular/Lymphatic: Numerous vessels are seen in the region of the  porta hepatis similar to the prior study. Aorta and IVC are normal in size. Can not exclude area of portal vein thrombosis image 3/26. this appears similar to the prior study. No enlarged lymph nodes are seen. Reproductive: Prostate is unremarkable. Other: No abdominal wall hernia or abnormality. No abdominopelvic ascites. Scant Musculoskeletal: No acute or significant osseous findings. IMPRESSION: 1. Fluid and edema of the pancreatic head compatible with acute pancreatitis. No focal fluid collections. 2. Pancreatic calcifications and ductal prominence appears similar to the prior study likely related to chronic pancreatitis. 3. Again seen are changes of chronic portal vein thrombosis with cavernous transformation. Electronically Signed   By: ARonney AstersM.D.   On: 07/19/2022 22:18    Assessment/Plan 1. Type 1 diabetes mellitus with hyperglycemia (HCC) Lab Results  Component Value Date   HGBA1C 7.0 (  H) 07/20/2022  -Home CBG controlled  - Continue on Novolog   2. Chronic pancreatitis due to chronic alcoholism ([place]) Status post Hospitalization  Has upcoming appointment for ERCP 08/12/2022  - CBC with Differential/Platelet - CMP with eGFR([facility])  3. Normocytic anemia hgb 12.4  Asymptomatic  - will recheck CBC - CBC with Differential/Platelet  Family/ staff Communication: Reviewed plan of care with patient verbalized understanding   Labs/tests ordered:  CBC/diff CMP   Next Appointment : Return in about 3 months (around 11/01/2022) for medical mangement of chronic issues with Dr.Miller .   Sandrea Hughs, NP

## 2022-08-03 LAB — CBC WITH DIFFERENTIAL/PLATELET
Absolute Monocytes: 781 cells/uL (ref 200–950)
Basophils Absolute: 39 cells/uL (ref 0–200)
Basophils Relative: 0.7 %
Eosinophils Absolute: 380 cells/uL (ref 15–500)
Eosinophils Relative: 6.9 %
HCT: 37.8 % — ABNORMAL LOW (ref 38.5–50.0)
Hemoglobin: 13.1 g/dL — ABNORMAL LOW (ref 13.2–17.1)
Lymphs Abs: 891 cells/uL (ref 850–3900)
MCH: 33.8 pg — ABNORMAL HIGH (ref 27.0–33.0)
MCHC: 34.7 g/dL (ref 32.0–36.0)
MCV: 97.4 fL (ref 80.0–100.0)
MPV: 10.5 fL (ref 7.5–12.5)
Monocytes Relative: 14.2 %
Neutro Abs: 3410 cells/uL (ref 1500–7800)
Neutrophils Relative %: 62 %
Platelets: 230 10*3/uL (ref 140–400)
RBC: 3.88 10*6/uL — ABNORMAL LOW (ref 4.20–5.80)
RDW: 13.2 % (ref 11.0–15.0)
Total Lymphocyte: 16.2 %
WBC: 5.5 10*3/uL (ref 3.8–10.8)

## 2022-08-03 LAB — COMPLETE METABOLIC PANEL WITH GFR
AG Ratio: 1.5 (calc) (ref 1.0–2.5)
ALT: 10 U/L (ref 9–46)
AST: 16 U/L (ref 10–40)
Albumin: 4 g/dL (ref 3.6–5.1)
Alkaline phosphatase (APISO): 79 U/L (ref 36–130)
BUN: 11 mg/dL (ref 7–25)
CO2: 28 mmol/L (ref 20–32)
Calcium: 9.2 mg/dL (ref 8.6–10.3)
Chloride: 101 mmol/L (ref 98–110)
Creat: 0.89 mg/dL (ref 0.60–1.26)
Globulin: 2.7 g/dL (calc) (ref 1.9–3.7)
Glucose, Bld: 93 mg/dL (ref 65–99)
Potassium: 3.7 mmol/L (ref 3.5–5.3)
Sodium: 137 mmol/L (ref 135–146)
Total Bilirubin: 0.8 mg/dL (ref 0.2–1.2)
Total Protein: 6.7 g/dL (ref 6.1–8.1)
eGFR: 114 mL/min/{1.73_m2} (ref >=60)

## 2022-08-06 ENCOUNTER — Telehealth: Payer: Self-pay

## 2022-08-06 ENCOUNTER — Other Ambulatory Visit (HOSPITAL_COMMUNITY): Payer: Self-pay

## 2022-08-06 ENCOUNTER — Other Ambulatory Visit: Payer: Self-pay | Admitting: Internal Medicine

## 2022-08-06 ENCOUNTER — Encounter (HOSPITAL_COMMUNITY): Payer: Self-pay | Admitting: Gastroenterology

## 2022-08-06 NOTE — Telephone Encounter (Signed)
  Received notification from [employer] that prior authorization is required for [facility] G6 Transmitter. PA submitted and APPROVED on 08/06/22.  Key [id number] Effective: 07/07/22 - 08/06/23

## 2022-08-12 ENCOUNTER — Ambulatory Visit (HOSPITAL_BASED_OUTPATIENT_CLINIC_OR_DEPARTMENT_OTHER)
Admission: RE | Admit: 2022-08-12 | Discharge: 2022-08-12 | Disposition: A | Discharge status: Home / Self Care | Payer: Managed Care, Other (non HMO) | Source: Home / Self Care | Attending: Gastroenterology | Admitting: Gastroenterology

## 2022-08-12 ENCOUNTER — Ambulatory Visit (HOSPITAL_BASED_OUTPATIENT_CLINIC_OR_DEPARTMENT_OTHER): Payer: Managed Care, Other (non HMO) | Admitting: Anesthesiology

## 2022-08-12 ENCOUNTER — Ambulatory Visit (HOSPITAL_COMMUNITY): Payer: Managed Care, Other (non HMO)

## 2022-08-12 ENCOUNTER — Encounter (HOSPITAL_COMMUNITY): Payer: Self-pay | Admitting: Gastroenterology

## 2022-08-12 ENCOUNTER — Encounter (HOSPITAL_COMMUNITY)
Admission: RE | Disposition: A | Discharge status: Home / Self Care | Payer: Self-pay | Source: Home / Self Care | Attending: Gastroenterology

## 2022-08-12 ENCOUNTER — Ambulatory Visit (HOSPITAL_COMMUNITY): Payer: Managed Care, Other (non HMO) | Admitting: Anesthesiology

## 2022-08-12 ENCOUNTER — Other Ambulatory Visit: Payer: Self-pay

## 2022-08-12 ENCOUNTER — Telehealth: Payer: Self-pay

## 2022-08-12 DIAGNOSIS — E119 Type 2 diabetes mellitus without complications: Secondary | ICD-10-CM | POA: Insufficient documentation

## 2022-08-12 DIAGNOSIS — Z8719 Personal history of other diseases of the digestive system: Secondary | ICD-10-CM | POA: Insufficient documentation

## 2022-08-12 DIAGNOSIS — K859 Acute pancreatitis without necrosis or infection, unspecified: Secondary | ICD-10-CM

## 2022-08-12 DIAGNOSIS — K838 Other specified diseases of biliary tract: Secondary | ICD-10-CM

## 2022-08-12 DIAGNOSIS — K8681 Exocrine pancreatic insufficiency: Secondary | ICD-10-CM | POA: Insufficient documentation

## 2022-08-12 DIAGNOSIS — K8689 Other specified diseases of pancreas: Secondary | ICD-10-CM | POA: Diagnosis not present

## 2022-08-12 DIAGNOSIS — K861 Other chronic pancreatitis: Secondary | ICD-10-CM

## 2022-08-12 DIAGNOSIS — F17290 Nicotine dependence, other tobacco product, uncomplicated: Secondary | ICD-10-CM | POA: Insufficient documentation

## 2022-08-12 DIAGNOSIS — K8591 Acute pancreatitis with uninfected necrosis, unspecified: Secondary | ICD-10-CM | POA: Diagnosis not present

## 2022-08-12 DIAGNOSIS — Z794 Long term (current) use of insulin: Secondary | ICD-10-CM

## 2022-08-12 HISTORY — PX: ENDOSCOPIC RETROGRADE CHOLANGIOPANCREATOGRAPHY (ERCP) WITH PROPOFOL: SHX5810

## 2022-08-12 HISTORY — PX: SPHINCTEROTOMY: SHX5544

## 2022-08-12 HISTORY — PX: PANCREATIC STENT PLACEMENT: SHX5539

## 2022-08-12 LAB — GLUCOSE, CAPILLARY: Glucose-Capillary: 162 mg/dL — ABNORMAL HIGH (ref 70–99)

## 2022-08-12 SURGERY — ENDOSCOPIC RETROGRADE CHOLANGIOPANCREATOGRAPHY (ERCP) WITH PROPOFOL
Anesthesia: General

## 2022-08-12 MED ORDER — LIDOCAINE 2% (20 MG/ML) 5 ML SYRINGE
INTRAMUSCULAR | Status: DC | PRN
  Administered 2022-08-12: 60 mg via INTRAVENOUS

## 2022-08-12 MED ORDER — SUGAMMADEX SODIUM 200 MG/2ML IV SOLN
INTRAVENOUS | Status: DC | PRN
  Administered 2022-08-12: 250 mg via INTRAVENOUS

## 2022-08-12 MED ORDER — FENTANYL CITRATE (PF) 100 MCG/2ML IJ SOLN
25.0000 ug | INTRAMUSCULAR | Status: DC | PRN
  Administered 2022-08-12 (×2): 50 ug via INTRAVENOUS

## 2022-08-12 MED ORDER — INDOMETHACIN 50 MG RE SUPP
RECTAL | Status: DC | PRN
  Administered 2022-08-12: 100 mg via RECTAL

## 2022-08-12 MED ORDER — PROPOFOL 10 MG/ML IV BOLUS
INTRAVENOUS | Status: AC
  Filled 2022-08-12: qty 20

## 2022-08-12 MED ORDER — FENTANYL CITRATE (PF) 100 MCG/2ML IJ SOLN
INTRAMUSCULAR | Status: AC
  Filled 2022-08-12: qty 2

## 2022-08-12 MED ORDER — MIDAZOLAM HCL 2 MG/2ML IJ SOLN
INTRAMUSCULAR | Status: AC
  Filled 2022-08-12: qty 2

## 2022-08-12 MED ORDER — PROPOFOL 10 MG/ML IV BOLUS
INTRAVENOUS | Status: DC | PRN
  Administered 2022-08-12: 50 mg via INTRAVENOUS
  Administered 2022-08-12: 150 mg via INTRAVENOUS

## 2022-08-12 MED ORDER — OXYCODONE HCL 5 MG PO TABS: 5.0000 mg | ORAL_TABLET | Freq: Four times a day (QID) | ORAL | 0 refills | Status: DC | PRN

## 2022-08-12 MED ORDER — MIDAZOLAM HCL 2 MG/2ML IJ SOLN
INTRAMUSCULAR | Status: DC | PRN
  Administered 2022-08-12: 2 mg via INTRAVENOUS

## 2022-08-12 MED ORDER — FENTANYL CITRATE (PF) 250 MCG/5ML IJ SOLN
INTRAMUSCULAR | Status: AC
  Filled 2022-08-12: qty 5

## 2022-08-12 MED ORDER — FENTANYL CITRATE (PF) 250 MCG/5ML IJ SOLN
INTRAMUSCULAR | Status: DC | PRN
  Administered 2022-08-12: 100 ug via INTRAVENOUS
  Administered 2022-08-12: 150 ug via INTRAVENOUS

## 2022-08-12 MED ORDER — GLUCAGON HCL RDNA (DIAGNOSTIC) 1 MG IJ SOLR
INTRAMUSCULAR | Status: AC
  Filled 2022-08-12: qty 2

## 2022-08-12 MED ORDER — CIPROFLOXACIN IN D5W 400 MG/200ML IV SOLN
INTRAVENOUS | Status: AC
  Filled 2022-08-12: qty 200

## 2022-08-12 MED ORDER — FENTANYL CITRATE (PF) 100 MCG/2ML IJ SOLN
INTRAMUSCULAR | Status: DC | PRN
  Administered 2022-08-12 (×2): 50 ug via INTRAVENOUS

## 2022-08-12 MED ORDER — LACTATED RINGERS IV SOLN: INTRAVENOUS | Status: DC | PRN

## 2022-08-12 MED ORDER — ROCURONIUM BROMIDE 10 MG/ML (PF) SYRINGE
PREFILLED_SYRINGE | INTRAVENOUS | Status: DC | PRN
  Administered 2022-08-12: 50 mg via INTRAVENOUS

## 2022-08-12 MED ORDER — PHENYLEPHRINE HCL (PRESSORS) 10 MG/ML IV SOLN
INTRAVENOUS | Status: DC | PRN
  Administered 2022-08-12: 160 ug via INTRAVENOUS

## 2022-08-12 MED ORDER — SODIUM CHLORIDE 0.9 % IV SOLN
INTRAVENOUS | Status: DC | PRN
  Administered 2022-08-12: 5 mL

## 2022-08-12 MED ORDER — INDOMETHACIN 50 MG RE SUPP
RECTAL | Status: AC
  Filled 2022-08-12: qty 2

## 2022-08-12 MED ORDER — GLUCAGON HCL RDNA (DIAGNOSTIC) 1 MG IJ SOLR
INTRAMUSCULAR | Status: DC | PRN
  Administered 2022-08-12 (×3): .25 mg via INTRAVENOUS

## 2022-08-12 MED ORDER — CIPROFLOXACIN IN D5W 400 MG/200ML IV SOLN
INTRAVENOUS | Status: DC | PRN
  Administered 2022-08-12: 400 mg via INTRAVENOUS

## 2022-08-12 MED ORDER — APIXABAN 5 MG PO TABS: 5.0000 mg | ORAL_TABLET | Freq: Two times a day (BID) | ORAL | Status: DC

## 2022-08-12 MED ORDER — ONDANSETRON HCL 4 MG/2ML IJ SOLN
INTRAMUSCULAR | Status: DC | PRN
  Administered 2022-08-12: 4 mg via INTRAVENOUS

## 2022-08-12 NOTE — Telephone Encounter (Signed)
The pt has been scheduled for office visit on 10/19/22 at 830 am with GM Recall entered   Letter mailed to the  pt with appt info and sent to My Chart

## 2022-08-12 NOTE — Anesthesia Procedure Notes (Signed)
Procedure Name: Intubation Date/Time: 08/12/2022 9:34 AM  Performed by: Cynda Familia, CRNAPre-anesthesia Checklist: Patient identified, Emergency Drugs available, Suction available and Patient being monitored Patient Re-evaluated:Patient Re-evaluated prior to induction Oxygen Delivery Method: Circle System Utilized Preoxygenation: Pre-oxygenation with 100% oxygen Induction Type: IV induction and Cricoid Pressure applied Ventilation: Mask ventilation without difficulty Grade View: Grade I Tube type: Oral Tube size: 7.5 mm Number of attempts: 1 Airway Equipment and Method: Stylet Placement Confirmation: ETT inserted through vocal cords under direct vision, positive ETCO2 and breath sounds checked- equal and bilateral Tube secured with: Tape Dental Injury: Teeth and Oropharynx as per pre-operative assessment  Comments: Smooth IV induction Green-- intubation AM CRNA atraumatic-- front teeth with chipping - preop op ---remains unchanged with laryngoscopy--- bilat BS

## 2022-08-12 NOTE — Anesthesia Postprocedure Evaluation (Signed)
Anesthesia Post Note  Patient: Carlos [name]  Procedure(s) Performed: ENDOSCOPIC RETROGRADE CHOLANGIOPANCREATOGRAPHY (ERCP) WITH PROPOFOL SPHINCTEROTOMY PANCREATIC STENT PLACEMENT     Patient location during evaluation: Endoscopy Anesthesia Type: General Level of consciousness: awake Pain management: pain level controlled Respiratory status: spontaneous breathing Cardiovascular status: stable Postop Assessment: no apparent nausea or vomiting Anesthetic complications: no   No notable events documented.  Last Vitals:  Vitals:   08/12/22 1140 08/12/22 1150  BP: (!) 145/99 138/84  Pulse: 77 81  Resp: 13 14  Temp:    SpO2: 99% 100%    Last Pain:  Vitals:   08/12/22 1150  TempSrc:   PainSc: 7                  Marchell Froman

## 2022-08-12 NOTE — Anesthesia Procedure Notes (Signed)
Date/Time: 08/12/2022 10:35 AM  Performed by: Cynda Familia, CRNAOxygen Delivery Method: Simple face mask Placement Confirmation: positive ETCO2 and breath sounds checked- equal and bilateral Dental Injury: Teeth and Oropharynx as per pre-operative assessment

## 2022-08-12 NOTE — H&P (Addendum)
GASTROENTEROLOGY PROCEDURE H&P NOTE   Primary Care Physician: [name], MD  HPI: Carlos [name] is a 36 y.o. male who presents for ERCP to evaluate pancreatic duct stricture, recurrent acute pancreatitis with chronic pancreatitis and abnormal PD and try to stent the pancreatic duct.  Past Medical History:  Diagnosis Date   AKI (acute kidney injury) ([place]) 12/21/2021   Anxiety    Diabetes ([place])    Pancreatitis    Splenic vein thrombosis    Past Surgical History:  Procedure Laterality Date   BIOPSY  12/02/2021   Procedure: BIOPSY;  Surgeon: Irving Copas., MD;  Location: [facility];  Service: Gastroenterology;;   ESOPHAGOGASTRODUODENOSCOPY (EGD) WITH PROPOFOL N/A 12/02/2021   Procedure: ESOPHAGOGASTRODUODENOSCOPY (EGD) WITH PROPOFOL;  Surgeon: Irving Copas., MD;  Location: [facility];  Service: Gastroenterology;  Laterality: N/A;   NO PAST SURGERIES     UPPER ESOPHAGEAL ENDOSCOPIC ULTRASOUND (EUS) N/A 12/02/2021   Procedure: UPPER ESOPHAGEAL ENDOSCOPIC ULTRASOUND (EUS);  Surgeon: Irving Copas., MD;  Location: [facility];  Service: Gastroenterology;  Laterality: N/A;   No current facility-administered medications for this encounter.   No current facility-administered medications for this encounter. Allergies  Allergen Reactions   Other Anaphylaxis    [name] - anaphylaxis   Family History  Problem Relation Age of Onset   Hypertension Mother    Alcoholism Maternal Uncle    Diabetes Maternal Grandmother    Diabetes Maternal Grandfather    Diabetes Paternal Grandmother    Diabetes Paternal Grandfather    Colon cancer Neg Hx    Esophageal cancer Neg Hx    Inflammatory bowel disease Neg Hx    Liver disease Neg Hx    Pancreatic cancer Neg Hx    Rectal cancer Neg Hx    Stomach cancer Neg Hx    Social History   Socioeconomic History   Marital status: Single    Spouse name: Not on file   Number of children: 6   Years of education:  Not on file   Highest education level: Not on file  Occupational History   Occupation: forklift driver  Tobacco Use   Smoking status: Some Days    Types: Cigars    Start date: 11/08/2002   Smokeless tobacco: Never   Tobacco comments:    Once a week.  Vaping Use   Vaping Use: Never used  Substance and Sexual Activity   Alcohol use: Not Currently   Drug use: Never   Sexual activity: Not on file  Other Topics Concern   Not on file  Social History Narrative   Not on file   Social Determinants of Health   Financial Resource Strain: Not on file  Food Insecurity: No Food Insecurity (07/20/2022)   Hunger Vital Sign    Worried About Running Out of Food in the Last Year: Never true    Ran Out of Food in the Last Year: Never true  Transportation Needs: No Transportation Needs (07/20/2022)   PRAPARE - Hydrologist (Medical): No    Lack of Transportation (Non-Medical): No  Physical Activity: Not on file  Stress: Not on file  Social Connections: Not on file  Intimate Partner Violence: Not At Risk (07/20/2022)   Humiliation, Afraid, Rape, and Kick questionnaire    Fear of Current or Ex-Partner: No    Emotionally Abused: No    Physically Abused: No    Sexually Abused: No    Physical Exam: Today's Vitals   08/12/22  0819  BP: (!) 155/110  Resp: 14  Temp: (!) 96.9 F (36.1 C)  TempSrc: Temporal  SpO2: 100%  Weight: 77.1 kg  Height: 6\' 5"  (1.956 m)  PainSc: 7    Body mass index is 20.16 kg/m. GEN: NAD EYE: Sclerae anicteric ENT: MMM CV: Non-tachycardic GI: Soft, NT/ND NEURO:  Alert & Oriented x 3  Lab Results: No results for input(s): "WBC", "HGB", "HCT", "PLT" in the last 72 hours. BMET No results for input(s): "NA", "K", "CL", "CO2", "GLUCOSE", "BUN", "CREATININE", "CALCIUM" in the last 72 hours. LFT No results for input(s): "PROT", "ALBUMIN", "AST", "ALT", "ALKPHOS", "BILITOT", "BILIDIR", "IBILI" in the last 72 hours. PT/INR No results  for input(s): "LABPROT", "INR" in the last 72 hours.   Impression / Plan: This is a 36 y.o.male who presents for ERCP to evaluate pancreatic duct stricture, recurrent acute pancreatitis with chronic pancreatitis and abnormal PD and try to stent the pancreatic duct.  The risks of an ERCP were discussed at length, including but not limited to the risk of perforation, bleeding, abdominal pain, post-ERCP pancreatitis (while usually mild can be severe and even life threatening).  The risks and benefits of endoscopic evaluation/treatment were discussed with the patient and/or family; these include but are not limited to the risk of perforation, infection, bleeding, missed lesions, lack of diagnosis, severe illness requiring hospitalization, as well as anesthesia and sedation related illnesses.  The patient's history has been reviewed, patient examined, no change in status, and deemed stable for procedure.  The patient and/or family is agreeable to proceed.    31, MD [facility] Office # [name]

## 2022-08-12 NOTE — Telephone Encounter (Signed)
-----   Message from Irving Copas., MD sent at 08/12/2022  2:38 PM EDT ----- Regarding: Follow-up Nery Kalisz, This patient needs an ERCP recall in 3 months for pancreatic stent exchange/upsizing. Please set him up a follow-up in clinic with myself in 4 to 6 weeks. We will discuss with him at clinic likely repeat imaging to evaluate the portal vein thrombosis and see if we can get him off anticoagulation soon. Thanks. GM

## 2022-08-12 NOTE — Progress Notes (Signed)
Patient evaluated in postoperative recovery area. Patient is groggy but waking up slowly. Discomfort currently is 7-8 out of 10 which is similar to what he had when he came in but he "feels slightly different". He will receive some pain medication postprocedure to see how he is doing but again if he is able to be had a similar type of discomfort as he came in and is able to tolerate fluids then my plan will be for him to be discharged. He will get a 5-day course for temporary pain relief with oxycodone in an effort of trying to see if we can keep him out of the hospital. He does know that if we do discharge him however and he feels that things are worsening or progressing and he cannot tolerate staying hydrated then he will need to come back into the hospital. We will reevaluate him in the coming 30 minutes to decide how he is doing and decide about potential discharge today. His significant other is aware of the results and awaiting potential discharge later today.   Justice Britain, MD [facility] Advanced [facility]oscopy Office # [phone]

## 2022-08-12 NOTE — Anesthesia Preprocedure Evaluation (Signed)
Anesthesia Evaluation  Patient identified by MRN, date of birth, ID band Patient awake    Reviewed: Allergy & Precautions, NPO status , Patient's Chart, lab work & pertinent test results  Airway Mallampati: II       Dental   Pulmonary Current Smoker and Patient abstained from smoking.,    breath sounds clear to auscultation       Cardiovascular negative cardio ROS   Rhythm:Regular Rate:Normal     Neuro/Psych PSYCHIATRIC DISORDERS negative neurological ROS     GI/Hepatic negative GI ROS, Hx noted Dr. Nyoka Cowden   Endo/Other  diabetes  Renal/GU Renal disease     Musculoskeletal   Abdominal   Peds  Hematology   Anesthesia Other Findings   Reproductive/Obstetrics                             Anesthesia Physical Anesthesia Plan  ASA: 3  Anesthesia Plan: General   Post-op Pain Management:    Induction: Intravenous  PONV Risk Score and Plan: Treatment may vary due to age or medical condition, Ondansetron, Dexamethasone and Midazolam  Airway Management Planned: Oral ETT  Additional Equipment:   Intra-op Plan:   Post-operative Plan: Extubation in OR  Informed Consent: I have reviewed the patients History and Physical, chart, labs and discussed the procedure including the risks, benefits and alternatives for the proposed anesthesia with the patient or authorized representative who has indicated his/her understanding and acceptance.     Dental advisory given  Plan Discussed with: Anesthesiologist and CRNA  Anesthesia Plan Comments:         Anesthesia Quick Evaluation

## 2022-08-12 NOTE — Transfer of Care (Signed)
Immediate Anesthesia Transfer of Care Note  Patient: Carlos [name]  Procedure(s) Performed: ENDOSCOPIC RETROGRADE CHOLANGIOPANCREATOGRAPHY (ERCP) WITH PROPOFOL SPHINCTEROTOMY PANCREATIC STENT PLACEMENT  Patient Location: PACU and Endoscopy Unit  Anesthesia Type:General  Level of Consciousness: awake and alert   Airway & Oxygen Therapy: Patient Spontanous Breathing and Patient connected to face mask oxygen  Post-op Assessment: Report given to RN and Post -op Vital signs reviewed and stable  Post vital signs: Reviewed and stable  Last Vitals:  Vitals Value Taken Time  BP    Temp    Pulse 98 08/12/22 1044  Resp 15 08/12/22 1044  SpO2 100 % 08/12/22 1044  Vitals shown include unvalidated device data.  Last Pain:  Vitals:   08/12/22 0819  TempSrc: Temporal  PainSc: 7       Patients Stated Pain Goal: 4 (67/54/49 2010)  Complications: No notable events documented.

## 2022-08-12 NOTE — Discharge Instructions (Signed)
YOU HAD AN ENDOSCOPIC PROCEDURE TODAY: Refer to the procedure report and other information in the discharge instructions given to you for any specific questions about what was found during the examination. If this information does not answer your questions, please call [place] office at [phone] to clarify.  ° °YOU SHOULD EXPECT: Some feelings of bloating in the abdomen. Passage of more gas than usual. Walking can help get rid of the air that was put into your GI tract during the procedure and reduce the bloating. If you had a lower endoscopy (such as a colonoscopy or flexible sigmoidoscopy) you may notice spotting of blood in your stool or on the toilet paper. Some abdominal soreness may be present for a day or two, also. ° °DIET: Your first meal following the procedure should be a light meal and then it is ok to progress to your normal diet. A half-sandwich or bowl of soup is an example of a good first meal. Heavy or fried foods are harder to digest and may make you feel nauseous or bloated. Drink plenty of fluids but you should avoid alcoholic beverages for 24 hours. If you had a esophageal dilation, please see attached instructions for diet.   ° °ACTIVITY: Your care partner should take you home directly after the procedure. You should plan to take it easy, moving slowly for the rest of the day. You can resume normal activity the day after the procedure however YOU SHOULD NOT DRIVE, use power tools, machinery or perform tasks that involve climbing or major physical exertion for 24 hours (because of the sedation medicines used during the test).  ° °SYMPTOMS TO REPORT IMMEDIATELY: °A gastroenterologist can be reached at any hour. Please call [phone]  for any of the following symptoms:  °Following lower endoscopy (colonoscopy, flexible sigmoidoscopy) °Excessive amounts of blood in the stool  °Significant tenderness, worsening of abdominal pains  °Swelling of the abdomen that is new, acute  °Fever of 100° or  higher  °Following upper endoscopy (EGD, EUS, ERCP, esophageal dilation) °Vomiting of blood or coffee ground material  °New, significant abdominal pain  °New, significant chest pain or pain under the shoulder blades  °Painful or persistently difficult swallowing  °New shortness of breath  °Black, tarry-looking or red, bloody stools ° °FOLLOW UP:  °If any biopsies were taken you will be contacted by phone or by letter within the next 1-3 weeks. Call [phone]  if you have not heard about the biopsies in 3 weeks.  °Please also call with any specific questions about appointments or follow up tests. ° °

## 2022-08-12 NOTE — Op Note (Signed)
[facility] Patient Name: Carlos [name] Procedure Date: 08/12/2022 MRN: [id number] Attending MD: Justice Britain , MD Date of Birth: [birth year] CSN: [id number] Age: 36 Admit Type: Outpatient Procedure:                ERCP Indications:              Pancreatic duct stricture, Unexplained acute                            pancreatitis, Abnormal MRCP, Chronic recurrent                            pancreatitis Providers:                Justice Britain, MD, Dulcy Fanny, Cletis Athens, Technician, Glenis Smoker, CRNA Referring MD:             Murray Hodgkins. Esmond Camper. Dimas Alexandria                            [name] Medicines:                General Anesthesia, Cipro 400 mg IV, Indomethacin                            100 mg PR, Glucagon 0.96 mg IV Complications:            No immediate complications. Estimated Blood Loss:     Estimated blood loss was minimal. Procedure:                Pre-Anesthesia Assessment:                           - Prior to the procedure, a History and Physical                            was performed, and patient medications and                            allergies were reviewed. The patient's tolerance of                            previous anesthesia was also reviewed. The risks                            and benefits of the procedure and the sedation                            options and risks were discussed with the patient.                            All questions were answered, and informed consent                            was  obtained. Prior Anticoagulants: The patient has                            taken Eliquis (apixaban), last dose was 7 days                            prior to procedure. ASA Grade Assessment: III - A                            patient with severe systemic disease. After                            reviewing the risks and benefits, the patient was                            deemed  in satisfactory condition to undergo the                            procedure.                           After obtaining informed consent, the scope was                            passed under direct vision. Throughout the                            procedure, the patient's blood pressure, pulse, and                            oxygen saturations were monitored continuously. The                            [employer] D single use                            duodenoscope was introduced through the mouth, and                            used to inject contrast into and used to inject                            contrast into the ventral pancreatic duct. The ERCP                            was somewhat difficult due to difficulty passing                            guidewires through pancreatic ductal stenosis.                            Successful completion of the procedure was aided by  performing the maneuvers documented (below) in this                            report. The patient tolerated the procedure. Scope In: Scope Out: Findings:      The scout film was normal.      The esophagus was successfully intubated under direct vision without       detailed examination of the pharynx, larynx, and associated structures,       and upper GI tract. The upper GI tract was grossly normal and compared       to previous endoscopy there was not significant evidence of gastritis       currently. The major papilla was small.      We had difficulty in cannulation in the short position so went into a       similar long position. Long position unfortunately did not allow for       visualization of the ampulla. Utilization of the Hydratome       sphincterotome unfortunately did not allow for adequate positioning due       to the size of the sphincterotome. We transition to the revolution       Jagtome. After being in a semilong position and distal, a short 0.035        inch Soft revolution Antonietta Breach was passed into the ventral pancreatic       duct. The ventral pancreatic duct was then deeply cannulated with the       Jagtome sphincterotome. Contrast was injected. I personally interpreted       the pancreatic duct images. Ductal flow of contrast was adequate. Image       quality was adequate. Contrast extended to the pancreatic duct.       Opacification of the entire pancreatic ductal system was successful. The       maximum diameter of the ducts was 4 mm within the body and tail region.       Segmental irregularity of the pancreatic duct was seen in the ventral       pancreatic duct in the head and genu of the pancreas. It was felt that a       stricture was present in the genu of the pancreas. A 5 mm ventral       pancreatic sphincterotomy was made with a monofilament Jagtome       sphincterotome using ERBE electrocautery. There was no       post-sphincterotomy bleeding. One 5 Fr by 9 cm plastic pancreatic stent       with two external flaps and a single internal flap was placed into the       ventral pancreatic duct and traverse the strictured area. Clear fluid       flowed through the stent. The stent was in good position.      The biliary tree was not visualized or entered into today's procedure.      The duodenoscope was withdrawn from the patient. Impression:               - Previous endoscopic evaluation suggested                            significant gastritis which was not present on  today's exam with the ERCP scope.                           - The major papilla appeared to be small.                           - Difficult cannulation initially but with                            maneuvers and transition to revolution Jagtome we                            were able to access the pancreatic tree.                           - An irregularity was found in the ventral                            pancreatic duct within  the head/genu of the                            pancreas. With stricturing noted in the genu of the                            pancreas.                           - A pancreatic sphincterotomy was performed.                           - One plastic pancreatic stent was placed into the                            ventral pancreatic duct and traversed the                            strictured area. Moderate Sedation:      Not Applicable - Patient had care per Anesthesia. Recommendation:           - The patient will be observed post-procedure,                            until all discharge criteria are met. Patient                            already was with 7-8 out of 10 pain preprocedure.                            We will evaluate the patient in the postoperative                            recovery area.                           - If the patient is  stable, we will try to                            discharge patient to home.                           - Patient has a contact number available for                            emergencies. The signs and symptoms of potential                            delayed complications were discussed with the                            patient. Return to normal activities tomorrow.                            Written discharge instructions were provided to the                            patient.                           - Low fat diet.                           - Watch for pancreatitis, bleeding, perforation,                            and cholangitis.                           - Repeat ERCP in 3 months to exchange?"upsize stent                            or place multiple stents and do PD balloon sweep.                           - May restart Eliquis in 48 hours (08/14/2022 PM).                           -We will need to evaluate patient's recent imaging                            studies to decide whether he has a role of                             continued anticoagulation therapy since he has been                            on it for more than 6 months at this point for the  previously noted portal vein thrombosis.                           - If we discharged the patient, a Short course of                            oxycodone 5 mg every 6 hours (5 days total) as                            needed will be given to the patient. However,                            patient is aware that if he develops significant                            nausea/vomiting/pain and cannot tolerate this                            discomfort and is not able to stay hydrated he                            needs to come back into the hospital for further                            evaluation. There would not be plan for further                            opioid/narcotic therapy for him.                           - The findings and recommendations were discussed                            with the patient.                           - The findings and recommendations were discussed                            with the designated responsible adult. Procedure Code(s):        --- Professional ---                           [phone], Endoscopic retrograde                            cholangiopancreatography (ERCP); with placement of                            endoscopic stent into biliary or pancreatic duct,                            including pre- and post-dilation and guide wire  passage, when performed, including sphincterotomy,                            when performed, each stent Diagnosis Code(s):        --- Professional ---                           R93.2, Abnormal findings on diagnostic imaging of                            liver and biliary tract                           K86.89, Other specified diseases of pancreas                           K85.90, Acute pancreatitis without necrosis or                             infection, unspecified                           K86.1, Other chronic pancreatitis                           K83.8, Other specified diseases of biliary tract CPT copyright 2019 American Medical Association. All rights reserved. The codes documented in this report are preliminary and upon coder review may  be revised to meet current compliance requirements. Justice Britain, MD 08/12/2022 10:48:16 AM Number of Addenda: 0

## 2022-08-13 ENCOUNTER — Encounter (HOSPITAL_COMMUNITY): Payer: Self-pay

## 2022-08-13 ENCOUNTER — Inpatient Hospital Stay (HOSPITAL_COMMUNITY)
Admission: EM | Admit: 2022-08-13 | Discharge: 2022-08-19 | DRG: 438 | Disposition: A | Discharge status: Home / Self Care | Payer: Managed Care, Other (non HMO) | Attending: Family Medicine | Admitting: Family Medicine

## 2022-08-13 ENCOUNTER — Emergency Department (HOSPITAL_COMMUNITY): Payer: Managed Care, Other (non HMO)

## 2022-08-13 ENCOUNTER — Other Ambulatory Visit: Payer: Self-pay

## 2022-08-13 DIAGNOSIS — Z8249 Family history of ischemic heart disease and other diseases of the circulatory system: Secondary | ICD-10-CM

## 2022-08-13 DIAGNOSIS — K859 Acute pancreatitis without necrosis or infection, unspecified: Secondary | ICD-10-CM | POA: Diagnosis not present

## 2022-08-13 DIAGNOSIS — K8591 Acute pancreatitis with uninfected necrosis, unspecified: Principal | ICD-10-CM | POA: Diagnosis present

## 2022-08-13 DIAGNOSIS — K861 Other chronic pancreatitis: Secondary | ICD-10-CM

## 2022-08-13 DIAGNOSIS — Z833 Family history of diabetes mellitus: Secondary | ICD-10-CM

## 2022-08-13 DIAGNOSIS — F17290 Nicotine dependence, other tobacco product, uncomplicated: Secondary | ICD-10-CM | POA: Diagnosis present

## 2022-08-13 DIAGNOSIS — F1011 Alcohol abuse, in remission: Secondary | ICD-10-CM | POA: Diagnosis present

## 2022-08-13 DIAGNOSIS — R52 Pain, unspecified: Secondary | ICD-10-CM | POA: Diagnosis present

## 2022-08-13 DIAGNOSIS — K9189 Other postprocedural complications and disorders of digestive system: Secondary | ICD-10-CM

## 2022-08-13 DIAGNOSIS — I81 Portal vein thrombosis: Secondary | ICD-10-CM | POA: Diagnosis present

## 2022-08-13 DIAGNOSIS — G8929 Other chronic pain: Secondary | ICD-10-CM | POA: Diagnosis present

## 2022-08-13 DIAGNOSIS — Z7901 Long term (current) use of anticoagulants: Secondary | ICD-10-CM

## 2022-08-13 DIAGNOSIS — Z86718 Personal history of other venous thrombosis and embolism: Secondary | ICD-10-CM

## 2022-08-13 DIAGNOSIS — Z794 Long term (current) use of insulin: Secondary | ICD-10-CM

## 2022-08-13 DIAGNOSIS — Z9641 Presence of insulin pump (external) (internal): Secondary | ICD-10-CM | POA: Diagnosis present

## 2022-08-13 DIAGNOSIS — Z79899 Other long term (current) drug therapy: Secondary | ICD-10-CM

## 2022-08-13 DIAGNOSIS — E1065 Type 1 diabetes mellitus with hyperglycemia: Secondary | ICD-10-CM | POA: Diagnosis present

## 2022-08-13 DIAGNOSIS — F419 Anxiety disorder, unspecified: Secondary | ICD-10-CM | POA: Diagnosis present

## 2022-08-13 DIAGNOSIS — D539 Nutritional anemia, unspecified: Secondary | ICD-10-CM | POA: Diagnosis present

## 2022-08-13 LAB — COMPREHENSIVE METABOLIC PANEL
ALT: 14 U/L (ref 0–44)
AST: 23 U/L (ref 15–41)
Albumin: 4.2 g/dL (ref 3.5–5.0)
Alkaline Phosphatase: 74 U/L (ref 38–126)
Anion gap: 11 (ref 5–15)
BUN: 10 mg/dL (ref 6–20)
CO2: 23 mmol/L (ref 22–32)
Calcium: 9.1 mg/dL (ref 8.9–10.3)
Chloride: 101 mmol/L (ref 98–111)
Creatinine, Ser: 0.85 mg/dL (ref 0.61–1.24)
GFR, Estimated: >60 mL/min (ref >60)
Glucose, Bld: 180 mg/dL — ABNORMAL HIGH (ref 70–99)
Potassium: 3.9 mmol/L (ref 3.5–5.1)
Sodium: 135 mmol/L (ref 135–145)
Total Bilirubin: 1 mg/dL (ref 0.3–1.2)
Total Protein: 7.7 g/dL (ref 6.5–8.1)

## 2022-08-13 LAB — CBC WITH DIFFERENTIAL/PLATELET
Abs Immature Granulocytes: 0.01 10*3/uL (ref 0.00–0.07)
Basophils Absolute: 0 10*3/uL (ref 0.0–0.1)
Basophils Relative: 0 %
Eosinophils Absolute: 0.3 10*3/uL (ref 0.0–0.5)
Eosinophils Relative: 5 %
HCT: 42.2 % (ref 39.0–52.0)
Hemoglobin: 14.1 g/dL (ref 13.0–17.0)
Immature Granulocytes: 0 %
Lymphocytes Relative: 14 %
Lymphs Abs: 0.9 10*3/uL (ref 0.7–4.0)
MCH: 32.9 pg (ref 26.0–34.0)
MCHC: 33.4 g/dL (ref 30.0–36.0)
MCV: 98.6 fL (ref 80.0–100.0)
Monocytes Absolute: 0.4 10*3/uL (ref 0.1–1.0)
Monocytes Relative: 7 %
Neutro Abs: 4.6 10*3/uL (ref 1.7–7.7)
Neutrophils Relative %: 74 %
Platelets: 187 10*3/uL (ref 150–400)
RBC: 4.28 MIL/uL (ref 4.22–5.81)
RDW: 12.6 % (ref 11.5–15.5)
WBC: 6.3 10*3/uL (ref 4.0–10.5)
nRBC: 0 % (ref 0.0–0.2)

## 2022-08-13 LAB — LIPASE, BLOOD: Lipase: 40 U/L (ref 11–51)

## 2022-08-13 MED ORDER — SODIUM CHLORIDE 0.9 % IV SOLN: INTRAVENOUS | Status: DC

## 2022-08-13 MED ORDER — HYDROMORPHONE HCL 1 MG/ML IJ SOLN
1.0000 mg | Freq: Once | INTRAMUSCULAR | Status: AC
  Administered 2022-08-14: 1 mg via INTRAVENOUS
  Filled 2022-08-13: qty 1

## 2022-08-13 MED ORDER — HYDROMORPHONE HCL 1 MG/ML IJ SOLN
1.0000 mg | Freq: Once | INTRAMUSCULAR | Status: AC
  Administered 2022-08-13: 1 mg via INTRAVENOUS
  Filled 2022-08-13: qty 1

## 2022-08-13 MED ORDER — ONDANSETRON HCL 4 MG/2ML IJ SOLN
4.0000 mg | Freq: Once | INTRAMUSCULAR | Status: AC
  Administered 2022-08-13: 4 mg via INTRAVENOUS
  Filled 2022-08-13: qty 2

## 2022-08-13 MED ORDER — SODIUM CHLORIDE 0.9 % IV BOLUS
1000.0000 mL | Freq: Once | INTRAVENOUS | Status: AC
  Administered 2022-08-13: 1000 mL via INTRAVENOUS

## 2022-08-13 MED ORDER — IOHEXOL 300 MG/ML  SOLN
100.0000 mL | Freq: Once | INTRAMUSCULAR | Status: AC | PRN
  Administered 2022-08-13: 100 mL via INTRAVENOUS

## 2022-08-13 MED ORDER — MORPHINE SULFATE (PF) 4 MG/ML IV SOLN
4.0000 mg | Freq: Once | INTRAVENOUS | Status: AC
  Administered 2022-08-13: 4 mg via INTRAVENOUS
  Filled 2022-08-13: qty 1

## 2022-08-13 NOTE — ED Provider Triage Note (Signed)
Emergency Medicine Provider Triage Evaluation Note  Lisa Blakeman , a 36 y.o. male  was evaluated in triage.  Pt complains of pt with chronic pancreatitis, portal vein thrombosis, prior ETOH abuse, had stent in pancreatic duct due to stricture yesterday.  Here with worsening pain, vomiting..  Review of Systems  Positive: Abd pain, vomiting  Negative: fever  Physical Exam  BP (!) 149/101   Pulse 90   Temp 99.9 F (37.7 C) (Oral)   Resp 17   Ht 6\' 5"  (1.956 m)   Wt 77.1 kg   SpO2 100%   BMI 20.16 kg/m  Gen:   Awake, no distress    Resp:  Normal effort   MSK:   Moves extremities without difficulty   Other:     Medical Decision Making  Medically screening exam initiated at 7:27 PM.  Appropriate orders placed.  Costella Hatcher was informed that the remainder of the evaluation will be completed by another provider, this initial triage assessment does not replace that evaluation, and the importance of remaining in the ED until their evaluation is complete.      Malvin Johns, MD 08/13/22 1929

## 2022-08-13 NOTE — ED Triage Notes (Signed)
Ambulatory to ED with c/o pain, nausea, and vomting s/p ERCP yesterday with stent placement. States last night, pain got really severe and symptoms have persisted since then.

## 2022-08-13 NOTE — H&P (Addendum)
History and Physical    Patient: Carlos [name] XBD:[id number] DOB: [birth year] DOA: 08/13/2022 DOS: the patient was seen and examined on 08/14/2022 PCP: [name], MD  Patient coming from: Home  Chief Complaint:  Chief Complaint  Patient presents with   Post-op Problem   HPI: Carlos [name] is a 36 y.o. male with medical history significant of portal vein thrombus on Eliquis, chronic pancreatitis, type 1 diabetes, history of alcohol abuse, pancreatic duct stricture s/p stent who presents with abdominal pain.  Patient underwent ERCP to evaluate pancreatic duct stricture, recurrent acute on chronic pancreatitis with [facility] Dr. Rush Landmark yesterday 08/12/2022.He underwent pancreatic sphincterotomy and placement of pancreatic stent.  Postoperatively he continued complaining of abdominal pain and was sent home with a short course of opioids.  Had minimal relief with pain medication and decided to come to ER. No nausea, vomiting or diarrhea.  In the ED he was afebrile, hypertensive with BP of 160/114.  No leukocytosis or anemia.  CMP is unremarkable.  Lipase of 40.  CT abdomen/pelvis shows findings of acute on chronic pancreatitis with possible pancreatic necrosis.  Chronic portal vein thrombus without apparent change since prior.  ED PA has sent message to [facility] Dr. Tammi Klippel and they will see in consultation in the morning. Review of Systems: As mentioned in the history of present illness. All other systems reviewed and are negative. Past Medical History:  Diagnosis Date   AKI (acute kidney injury) ([place]) 12/21/2021   Anxiety    Diabetes ([place])    Pancreatitis    Splenic vein thrombosis    Past Surgical History:  Procedure Laterality Date   BIOPSY  12/02/2021   Procedure: BIOPSY;  Surgeon: Irving Copas., MD;  Location: [facility];  Service: Gastroenterology;;   ESOPHAGOGASTRODUODENOSCOPY (EGD) WITH PROPOFOL N/A 12/02/2021   Procedure: ESOPHAGOGASTRODUODENOSCOPY (EGD) WITH  PROPOFOL;  Surgeon: Irving Copas., MD;  Location: [facility];  Service: Gastroenterology;  Laterality: N/A;   NO PAST SURGERIES     UPPER ESOPHAGEAL ENDOSCOPIC ULTRASOUND (EUS) N/A 12/02/2021   Procedure: UPPER ESOPHAGEAL ENDOSCOPIC ULTRASOUND (EUS);  Surgeon: Irving Copas., MD;  Location: [facility];  Service: Gastroenterology;  Laterality: N/A;   Social History:  reports that he has been smoking cigars. He started smoking about 19 years ago. He has never used smokeless tobacco. He reports that he does not currently use alcohol. He reports that he does not use drugs.  Allergies  Allergen Reactions   Other Anaphylaxis    [name] - anaphylaxis    Family History  Problem Relation Age of Onset   Hypertension Mother    Alcoholism Maternal Uncle    Diabetes Maternal Grandmother    Diabetes Maternal Grandfather    Diabetes Paternal Grandmother    Diabetes Paternal Grandfather    Colon cancer Neg Hx    Esophageal cancer Neg Hx    Inflammatory bowel disease Neg Hx    Liver disease Neg Hx    Pancreatic cancer Neg Hx    Rectal cancer Neg Hx    Stomach cancer Neg Hx     Prior to Admission medications   Medication Sig Start Date End Date Taking? Authorizing Provider  acetaminophen (TYLENOL) 325 MG tablet Take 2 tablets (650 mg total) by mouth every 6 (six) hours as needed for mild pain (or Fever >/= 101). 07/22/22  Yes Samuella Cota, MD  apixaban (ELIQUIS) 5 MG TABS tablet Take 1 tablet (5 mg total) by mouth 2 (two) times daily. 08/14/22  Yes [name], Telford Nab., MD  CREON [phone] units CPEP capsule TAKE 2 CAPSULES BY MOUTH THREE TIMES DAILY WITH MEALS MAY ALSO TAKE 1 CAPSULE AS NEEDED WITH SNACKS Patient taking differently: See admin instructions. Take 2 capsules by mouth three times a day with meals may also take 1 capsule as needed with snacks 05/13/22  Yes [name], Telford Nab., MD  diphenhydramine-acetaminophen (TYLENOL PM) 25-500 MG TABS tablet  Take 2 tablets by mouth at bedtime as needed (sleep, pain).   Yes [provider]  HYDROmorphone (DILAUDID) 4 MG tablet Take 1 tablet (4 mg total) by mouth every 4 (four) hours as needed for severe pain. 07/26/22  Yes [name], Dinah C, NP  insulin aspart (NOVOLOG) 100 UNIT/ML injection Inject 120-150 Units into the skin See admin instructions. 120-150 units into insulin pump every 3 days   Yes [provider]  Multiple Vitamins-Minerals (ADVANCED DIABETIC MULTIVITAMIN PO) Take 1 tablet by mouth daily.   Yes [provider]  omeprazole (PRILOSEC) 40 MG capsule Take 1 capsule (40 mg total) by mouth daily. 05/27/22  Yes [name], Dinah C, NP  ondansetron (ZOFRAN) 8 MG tablet Take 1 tablet (8 mg total) by mouth every 8 (eight) hours as needed for nausea or vomiting. 04/23/22  Yes Ezequiel Essex, MD  oxyCODONE (ROXICODONE) 5 MG immediate release tablet Take 1 tablet (5 mg total) by mouth every 6 (six) hours as needed. 08/12/22 08/12/23 Yes [name], Telford Nab., MD  Insulin Disposable Pump (OMNIPOD 5 G6 INTRO, GEN 5,) KIT 1 Device by Does not apply route every 3 (three) days. 03/10/22   [name], Melanie Crazier, MD    Physical Exam: Vitals:   08/13/22 2115 08/13/22 2230 08/13/22 2315 08/14/22 0026  BP: (!) 154/104 (!) 158/109 (!) 160/114   Pulse: 88 73 82   Resp: '20 17 18   ' Temp:    98.9 F (37.2 C)  TempSrc:    Oral  SpO2: 100% 100% 98%   Weight:      Height:       Constitutional: NAD, calm, comfortable, young male sitting upright in his bed Eyes: lids and conjunctivae normal ENMT: Mucous membranes are moist.  Neck: normal, supple Respiratory: clear to auscultation bilaterally, no wheezing, no crackles. Normal respiratory effort. No accessory muscle use.  Cardiovascular: Regular rate and rhythm, no murmurs / rubs / gallops. No extremity edema.  Abdomen: Soft, nondistended with mild tenderness to  Epigastric to right upper quadrant.  Bowel sounds positive.   Musculoskeletal: no clubbing / cyanosis. No joint deformity upper and lower extremities. Good ROM, no contractures. Normal muscle tone.  Skin: no rashes, lesions, ulcers.  Neurologic: CN 2-12 grossly intact. Strength 5/5 in all 4.  Psychiatric: Normal judgment and insight. Alert and oriented x 3. Normal mood. Data Reviewed:  See HPI  Assessment and Plan: * Acute on chronic pancreatitis ([place]) Post ERCP acute on chronic pancreatitis-underwent pancreatic sphincterotomy and placement of pancreatic stent with [facility] Dr. Rush Landmark yesterday 08/12/2022. 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 At that time imaging showed collaterals so unclear how long thrombus had been present. Did not follow up with hematology  afterwards. 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  Suspect thrombus have been present for at least a year since collaterals have formed.  Hematology did not recommend further anticoagulant therapy and Eliquis was discontinued since his risk of bleeding from varices is high and anticoagulation therapy was not likely to improve his outcome. -Diagnosed with portal vein thrombus on 01/2022 and hematology was consulted.  Patient was started on therapeutic Lovenox inpatient and then discharged with DOAC x3 months. -CT A/P imaging today shows unchanged chronic portal venous thrombosis. Denies any missed doses of Eliquis other than holding it for the past few days due to ERCP procedure.  Recommend discuss with hematology at this the benefit of continuing with anticoagulation therapy.  -resume Eliquis tomorrow per GI post ERCP  History of alcohol abuse Course that he has been abstinence for quite some time      Advance Care Planning:   Code Status: Full Code   Consults: none  Family Communication: none at bedside  Severity of Illness: The appropriate patient status for this patient is OBSERVATION. Observation status is judged to be reasonable and necessary in order to provide the required intensity of service to ensure the patient's safety. The patient's presenting symptoms, physical exam findings, and initial radiographic and laboratory data in the context of their medical condition is felt to place them at decreased risk for further clinical deterioration. Furthermore, it is anticipated that the patient will be medically stable for discharge from the hospital within 2 midnights of admission.   Author: Orene Desanctis, DO 08/14/2022 1:14 AM  For on call review [email or web address].

## 2022-08-13 NOTE — ED Provider Notes (Signed)
[facility] DEPT Provider Note   CSN: [id number] Arrival date & time: 08/13/22  1909     History  Chief Complaint  Patient presents with   Post-op Problem    Carlos [name] is a 36 y.o. male.  Pt is a 36 yo male with a pmhx significant for DM1, portal vein thrombosis (on Eliquis), and chronic pancreatitis.  Pt was hospitalized from 9/11-9/14 for acute on chronic pancreatitis.  He has a pancreatic duct stricture, so Dr. Rush Landmark ([facility]) did an ERCP and placed a stent yesterday.  Pt was having pain and nausea after the procedure, but thought he could tolerate going home, so he was sent home with oxycodone.  Pt said his pain worsened last night with nausea and vomiting.  Pt said the pain is a 10/10 now.         Home Medications Prior to Admission medications   Medication Sig Start Date End Date Taking? Authorizing Provider  acetaminophen (TYLENOL) 325 MG tablet Take 2 tablets (650 mg total) by mouth every 6 (six) hours as needed for mild pain (or Fever >/= 101). 07/22/22  Yes Samuella Cota, MD  apixaban (ELIQUIS) 5 MG TABS tablet Take 1 tablet (5 mg total) by mouth 2 (two) times daily. 08/14/22  Yes [name], Telford Nab., MD  CREON [phone] units CPEP capsule TAKE 2 CAPSULES BY MOUTH THREE TIMES DAILY WITH MEALS MAY ALSO TAKE 1 CAPSULE AS NEEDED WITH SNACKS Patient taking differently: See admin instructions. Take 2 capsules by mouth three times a day with meals may also take 1 capsule as needed with snacks 05/13/22  Yes [name], Telford Nab., MD  diphenhydramine-acetaminophen (TYLENOL PM) 25-500 MG TABS tablet Take 2 tablets by mouth at bedtime as needed (sleep, pain).   Yes [provider]  HYDROmorphone (DILAUDID) 4 MG tablet Take 1 tablet (4 mg total) by mouth every 4 (four) hours as needed for severe pain. 07/26/22  Yes [name], Dinah C, NP  insulin aspart (NOVOLOG) 100 UNIT/ML injection Inject 120-150 Units into the skin See admin  instructions. 120-150 units into insulin pump every 3 days   Yes [provider]  Multiple Vitamins-Minerals (ADVANCED DIABETIC MULTIVITAMIN PO) Take 1 tablet by mouth daily.   Yes [provider]  omeprazole (PRILOSEC) 40 MG capsule Take 1 capsule (40 mg total) by mouth daily. 05/27/22  Yes [name], Dinah C, NP  ondansetron (ZOFRAN) 8 MG tablet Take 1 tablet (8 mg total) by mouth every 8 (eight) hours as needed for nausea or vomiting. 04/23/22  Yes Ezequiel Essex, MD  oxyCODONE (ROXICODONE) 5 MG immediate release tablet Take 1 tablet (5 mg total) by mouth every 6 (six) hours as needed. 08/12/22 08/12/23 Yes [name], Telford Nab., MD  Insulin Disposable Pump (OMNIPOD 5 G6 INTRO, GEN 5,) KIT 1 Device by Does not apply route every 3 (three) days. 03/10/22   [name], Melanie Crazier, MD      Allergies    Other    Review of Systems   Review of Systems  Gastrointestinal:  Positive for abdominal pain, nausea and vomiting.  All other systems reviewed and are negative.   Physical Exam Updated Vital Signs BP (!) 160/114   Pulse 82   Temp 99.9 F (37.7 C) (Oral)   Resp 18   Ht _0  (1.956 m)   Wt 77.1 kg   SpO2 98%   BMI 20.16 kg/m  Physical Exam Vitals and nursing note reviewed.  Constitutional:      Appearance:  Normal appearance.  HENT:     Head: Normocephalic and atraumatic.     Right Ear: External ear normal.     Left Ear: External ear normal.     Nose: Nose normal.     Mouth/Throat:     Mouth: Mucous membranes are dry.  Eyes:     Extraocular Movements: Extraocular movements intact.     Conjunctiva/sclera: Conjunctivae normal.     Pupils: Pupils are equal, round, and reactive to light.  Cardiovascular:     Rate and Rhythm: Normal rate and regular rhythm.     Pulses: Normal pulses.     Heart sounds: Normal heart sounds.  Pulmonary:     Effort: Pulmonary effort is normal.     Breath sounds: Normal breath sounds.  Abdominal:     General: Abdomen is flat.  Bowel sounds are normal.     Palpations: Abdomen is soft.     Tenderness: There is generalized abdominal tenderness.  Musculoskeletal:        General: Normal range of motion.     Cervical back: Normal range of motion and neck supple.  Skin:    General: Skin is warm.     Capillary Refill: Capillary refill takes less than 2 seconds.  Neurological:     General: No focal deficit present.     Mental Status: He is alert and oriented to person, place, and time.  Psychiatric:        Mood and Affect: Mood normal.        Behavior: Behavior normal.     ED Results / Procedures / Treatments   Labs (all labs ordered are listed, but only abnormal results are displayed) Labs Reviewed  COMPREHENSIVE METABOLIC PANEL - Abnormal; Notable for the following components:      Result Value   Glucose, Bld 180 (*)    All other components within normal limits  LIPASE, BLOOD  CBC WITH DIFFERENTIAL/PLATELET  URINALYSIS, ROUTINE W REFLEX MICROSCOPIC    EKG None  Radiology CT Abdomen Pelvis W Contrast  Result Date: 08/13/2022 CLINICAL DATA:  Postoperative abdominal pain. History of pancreatic stent placed for pancreatitis. EXAM: CT ABDOMEN AND PELVIS WITH CONTRAST TECHNIQUE: Multidetector CT imaging of the abdomen and pelvis was performed using the standard protocol following bolus administration of intravenous contrast. RADIATION DOSE REDUCTION: This exam was performed according to the departmental dose-optimization program which includes automated exposure control, adjustment of the mA and/or kV according to patient size and/or use of iterative reconstruction technique. CONTRAST:  144m OMNIPAQUE IOHEXOL 300 MG/ML  SOLN COMPARISON:  07/19/2022 FINDINGS: Lower chest: Lung bases are clear. Hepatobiliary: No focal liver abnormality is seen. Chronic portal vein thrombosis with cavernous transformation. No change. Findings are less well demonstrated than on the previous study due to the stage of contrast bolus. No  gallstones, gallbladder wall thickening, or biliary dilatation. Pancreas: A pancreatic duct stent is in place. Heterogeneous enlarged appearance of the pancreas with low-attenuation changes suggested in the head and body of the pancreas, likely representing pancreatic necrosis. Mild stranding around the pancreas. Scattered calcifications suggest acute on chronic pancreatitis. Follow-up after resolution of acute process is recommended to exclude underlying pancreatic mass. Spleen: Normal in size without focal abnormality. Adrenals/Urinary Tract: Adrenal glands are unremarkable. Kidneys are normal, without renal calculi, focal lesion, or hydronephrosis. Bladder is unremarkable. Stomach/Bowel: Stomach is within normal limits. Appendix appears normal. No evidence of bowel wall thickening, distention, or inflammatory changes. Vascular/Lymphatic: No significant vascular findings are present. No enlarged abdominal or  pelvic lymph nodes. Reproductive: Prostate is unremarkable. Other: No abdominal wall hernia or abnormality. No abdominopelvic ascites. Musculoskeletal: No acute or significant osseous findings. IMPRESSION: 1. Changes of acute pancreatitis with enlarged edematous pancreatic parenchyma. Low-attenuation changes suggest likely pancreatic necrosis. No loculated collections. A pancreatic duct stent is in place. Calcifications in the pancreas suggest evidence of chronic pancreatitis. 2. Chronic portal venous thrombosis without apparent change since prior study. Electronically Signed   By: Lucienne Capers M.D.   On: 08/13/2022 23:21   DG ERCP  Result Date: 08/12/2022 CLINICAL DATA:  History of pancreatitis.  ERCP EXAM: ERCP TECHNIQUE: Multiple spot images obtained with the fluoroscopic device and submitted for interpretation post-procedure. FLUOROSCOPY TIME: FLUOROSCOPY TIME 3 minutes, 8 seconds (18.7 mGy) COMPARISON:  CT abdomen and pelvis-07/19/2022; MRCP-06/02/2022 FINDINGS: Fourteen spot intraoperative  fluoroscopic images of the right upper abdominal quadrant during ERCP are provided for review Initial image demonstrates an ERCP probe overlying the right upper abdominal quadrant. Subsequent images demonstrate selective cannulation of the pancreatic duct, initially with cannulation of a pancreatic head side branch, ultimately with cannulation of the main pancreatic duct. Contrast injection suggests mild dilatation at the mid body of the pancreas Subsequent images demonstrate placement of an internal pancreatic stent. IMPRESSION: ERCP with pancreatic stent placement as above. These images were submitted for radiologic interpretation only. Please see the procedural report for the amount of contrast and the fluoroscopy time utilized. Electronically Signed   By: Sandi Mariscal M.D.   On: 08/12/2022 12:00    Procedures Procedures    Medications Ordered in ED Medications  HYDROmorphone (DILAUDID) injection 1 mg (has no administration in time range)  sodium chloride 0.9 % bolus 1,000 mL (1,000 mLs Intravenous New Bag/Given 08/13/22 2150)  morphine (PF) 4 MG/ML injection 4 mg (4 mg Intravenous Given 08/13/22 2152)  ondansetron (ZOFRAN) injection 4 mg (4 mg Intravenous Given 08/13/22 2150)  iohexol (OMNIPAQUE) 300 MG/ML solution 100 mL (100 mLs Intravenous Contrast Given 08/13/22 2128)  HYDROmorphone (DILAUDID) injection 1 mg (1 mg Intravenous Given 08/13/22 2305)    ED Course/ Medical Decision Making/ A&P                           Medical Decision Making Amount and/or Complexity of Data Reviewed Labs: ordered.  Risk Prescription drug management. Decision regarding hospitalization.   This patient presents to the ED for concern of abd pain, this involves an extensive number of treatment options, and is a complaint that carries with it a high risk of complications and morbidity.  The differential diagnosis includes pancreatitis, gastritis, dka, stent problem   Co morbidities that complicate the patient  evaluation  DM1, portal vein thrombosis (on Eliquis), and chronic pancreatitis   Additional history obtained:  Additional history obtained from epic chart review   Lab Tests:  I Ordered, and personally interpreted labs.  The pertinent results include:  cbc nl, cmp nl other than glucose elevated at 180; lip 40;    Imaging Studies ordered:  I ordered imaging studies including ct abd/pelvis  I independently visualized and interpreted imaging which showed  IMPRESSION:  1. Changes of acute pancreatitis with enlarged edematous pancreatic  parenchyma. Low-attenuation changes suggest likely pancreatic  necrosis. No loculated collections. A pancreatic duct stent is in  place. Calcifications in the pancreas suggest evidence of chronic  pancreatitis.  2. Chronic portal venous thrombosis without apparent change since  prior study.   I agree with the radiologist interpretation  Cardiac Monitoring:  The patient was maintained on a cardiac monitor.  I personally viewed and interpreted the cardiac monitored which showed an underlying rhythm of: nsr   Medicines ordered and prescription drug management:  I ordered medication including morphine and zofran  for pain and nausea  Reevaluation of the patient after these medicines showed that the patient stayed the same I have reviewed the patients home medicines and have made adjustments as needed   Test Considered:  ct   Critical Interventions:  Pain control   Consultations Obtained:  I requested consultation with the hospitalist (Dr. Flossie Buffy),  and discussed lab and imaging findings as well as pertinent plan - she will admit Dr. Collene Mares (on call for [facility]) messaged to let her know pt will need a consult in the am.  He is stable and does not need one now.   Problem List / ED Course:  Acute pancreatitis:  Pancreatitis after ERCP.  Pt will need admission for pain control and ivfs.   Reevaluation:  After the interventions noted above,  I reevaluated the patient and found that they have :improved   Social Determinants of Health:  Lives at home   Dispostion:  After consideration of the diagnostic results and the patients response to treatment, I feel that the patent would benefit from admission.          Final Clinical Impression(s) / ED Diagnoses Final diagnoses:  Acute pancreatitis, unspecified complication status, unspecified pancreatitis type    Rx / DC Orders ED Discharge Orders     None         Isla Pence, MD 08/13/22 2340

## 2022-08-14 DIAGNOSIS — Z8249 Family history of ischemic heart disease and other diseases of the circulatory system: Secondary | ICD-10-CM | POA: Diagnosis not present

## 2022-08-14 DIAGNOSIS — Z794 Long term (current) use of insulin: Secondary | ICD-10-CM | POA: Diagnosis not present

## 2022-08-14 DIAGNOSIS — I81 Portal vein thrombosis: Secondary | ICD-10-CM | POA: Diagnosis present

## 2022-08-14 DIAGNOSIS — G8929 Other chronic pain: Secondary | ICD-10-CM | POA: Diagnosis present

## 2022-08-14 DIAGNOSIS — Z86718 Personal history of other venous thrombosis and embolism: Secondary | ICD-10-CM | POA: Diagnosis not present

## 2022-08-14 DIAGNOSIS — Z833 Family history of diabetes mellitus: Secondary | ICD-10-CM | POA: Diagnosis not present

## 2022-08-14 DIAGNOSIS — F419 Anxiety disorder, unspecified: Secondary | ICD-10-CM | POA: Diagnosis present

## 2022-08-14 DIAGNOSIS — K8591 Acute pancreatitis with uninfected necrosis, unspecified: Secondary | ICD-10-CM | POA: Diagnosis present

## 2022-08-14 DIAGNOSIS — F1011 Alcohol abuse, in remission: Secondary | ICD-10-CM | POA: Diagnosis present

## 2022-08-14 DIAGNOSIS — K861 Other chronic pancreatitis: Secondary | ICD-10-CM | POA: Diagnosis present

## 2022-08-14 DIAGNOSIS — Z7901 Long term (current) use of anticoagulants: Secondary | ICD-10-CM | POA: Diagnosis not present

## 2022-08-14 DIAGNOSIS — Z79899 Other long term (current) drug therapy: Secondary | ICD-10-CM | POA: Diagnosis not present

## 2022-08-14 DIAGNOSIS — Z9641 Presence of insulin pump (external) (internal): Secondary | ICD-10-CM | POA: Diagnosis present

## 2022-08-14 DIAGNOSIS — K859 Acute pancreatitis without necrosis or infection, unspecified: Secondary | ICD-10-CM | POA: Diagnosis not present

## 2022-08-14 DIAGNOSIS — E1065 Type 1 diabetes mellitus with hyperglycemia: Secondary | ICD-10-CM | POA: Diagnosis present

## 2022-08-14 DIAGNOSIS — D539 Nutritional anemia, unspecified: Secondary | ICD-10-CM | POA: Diagnosis present

## 2022-08-14 DIAGNOSIS — F17290 Nicotine dependence, other tobacco product, uncomplicated: Secondary | ICD-10-CM | POA: Diagnosis present

## 2022-08-14 LAB — COMPREHENSIVE METABOLIC PANEL
ALT: 13 U/L (ref 0–44)
AST: 15 U/L (ref 15–41)
Albumin: 3.3 g/dL — ABNORMAL LOW (ref 3.5–5.0)
Alkaline Phosphatase: 61 U/L (ref 38–126)
Anion gap: 8 (ref 5–15)
BUN: 8 mg/dL (ref 6–20)
CO2: 22 mmol/L (ref 22–32)
Calcium: 8.1 mg/dL — ABNORMAL LOW (ref 8.9–10.3)
Chloride: 102 mmol/L (ref 98–111)
Creatinine, Ser: 0.64 mg/dL (ref 0.61–1.24)
GFR, Estimated: >60 mL/min (ref >60)
Glucose, Bld: 154 mg/dL — ABNORMAL HIGH (ref 70–99)
Potassium: 3.5 mmol/L (ref 3.5–5.1)
Sodium: 132 mmol/L — ABNORMAL LOW (ref 135–145)
Total Bilirubin: 1.1 mg/dL (ref 0.3–1.2)
Total Protein: 6.5 g/dL (ref 6.5–8.1)

## 2022-08-14 LAB — CBG MONITORING, ED
Glucose-Capillary: 168 mg/dL — ABNORMAL HIGH (ref 70–99)
Glucose-Capillary: 225 mg/dL — ABNORMAL HIGH (ref 70–99)

## 2022-08-14 LAB — GLUCOSE, CAPILLARY
Glucose-Capillary: 217 mg/dL — ABNORMAL HIGH (ref 70–99)
Glucose-Capillary: 172 mg/dL — ABNORMAL HIGH (ref 70–99)

## 2022-08-14 MED ORDER — PANTOPRAZOLE SODIUM 40 MG PO TBEC
80.0000 mg | DELAYED_RELEASE_TABLET | Freq: Every day | ORAL | Status: DC
  Administered 2022-08-14 – 2022-08-19 (×6): 80 mg via ORAL
  Filled 2022-08-14 (×6): qty 2

## 2022-08-14 MED ORDER — MORPHINE SULFATE (PF) 2 MG/ML IV SOLN
2.0000 mg | INTRAVENOUS | Status: DC | PRN
  Administered 2022-08-14 (×2): 2 mg via INTRAVENOUS
  Filled 2022-08-14 (×2): qty 1

## 2022-08-14 MED ORDER — APIXABAN 5 MG PO TABS
5.0000 mg | ORAL_TABLET | Freq: Two times a day (BID) | ORAL | Status: DC
  Administered 2022-08-14 – 2022-08-19 (×11): 5 mg via ORAL
  Filled 2022-08-14 (×11): qty 1

## 2022-08-14 MED ORDER — ACETAMINOPHEN 325 MG PO TABS: 650.0000 mg | ORAL_TABLET | Freq: Four times a day (QID) | ORAL | Status: DC | PRN

## 2022-08-14 MED ORDER — PANCRELIPASE (LIP-PROT-AMYL) 12000-38000 UNITS PO CPEP
36000.0000 [IU] | ORAL_CAPSULE | Freq: Three times a day (TID) | ORAL | Status: DC
  Administered 2022-08-14 – 2022-08-19 (×6): 36000 [IU] via ORAL
  Filled 2022-08-14 (×2): qty 3
  Filled 2022-08-14: qty 1
  Filled 2022-08-14 (×3): qty 3
  Filled 2022-08-14: qty 1
  Filled 2022-08-14 (×2): qty 3
  Filled 2022-08-14: qty 1

## 2022-08-14 MED ORDER — HYDROMORPHONE HCL 2 MG/ML IJ SOLN
2.0000 mg | INTRAMUSCULAR | Status: DC | PRN
  Administered 2022-08-14 – 2022-08-15 (×9): 2 mg via INTRAVENOUS
  Filled 2022-08-14 (×9): qty 1

## 2022-08-14 MED ORDER — INSULIN ASPART 100 UNIT/ML IJ SOLN
0.0000 [IU] | Freq: Three times a day (TID) | INTRAMUSCULAR | Status: DC
  Administered 2022-08-14: 3 [IU] via SUBCUTANEOUS
  Administered 2022-08-14: 5 [IU] via SUBCUTANEOUS
  Administered 2022-08-14: 3 [IU] via SUBCUTANEOUS
  Administered 2022-08-14: 5 [IU] via SUBCUTANEOUS
  Administered 2022-08-15: 2 [IU] via SUBCUTANEOUS
  Administered 2022-08-15: 3 [IU] via SUBCUTANEOUS
  Administered 2022-08-15: 8 [IU] via SUBCUTANEOUS
  Administered 2022-08-16: 3 [IU] via SUBCUTANEOUS
  Administered 2022-08-16 (×2): 2 [IU] via SUBCUTANEOUS
  Administered 2022-08-16: 3 [IU] via SUBCUTANEOUS
  Administered 2022-08-17 (×2): 5 [IU] via SUBCUTANEOUS
  Administered 2022-08-17: 3 [IU] via SUBCUTANEOUS
  Administered 2022-08-17: 2 [IU] via SUBCUTANEOUS
  Administered 2022-08-18: 15 [IU] via SUBCUTANEOUS
  Administered 2022-08-18: 2 [IU] via SUBCUTANEOUS
  Administered 2022-08-18: 5 [IU] via SUBCUTANEOUS
  Administered 2022-08-19 (×2): 2 [IU] via SUBCUTANEOUS
  Filled 2022-08-14: qty 0.15

## 2022-08-14 NOTE — Assessment & Plan Note (Addendum)
-  hx of splenic vein thrombosis dx in 08/2021 and was started on Eliquis in ED. At that time imaging showed collaterals so unclear how long thrombus had been present. Did not follow up with hematology afterwards. 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  Suspect thrombus have been present for at least a year since collaterals have formed.  Hematology did not recommend further anticoagulant therapy and Eliquis was discontinued since his risk of bleeding from varices is high and anticoagulation therapy was not likely to improve his outcome. -Diagnosed with portal vein thrombus on 01/2022 and hematology was consulted.  Patient was started on therapeutic Lovenox inpatient and then discharged with DOAC x3 months. -CT A/P imaging today shows unchanged chronic portal venous thrombosis. Denies any missed doses of Eliquis other than holding it for the past few days due to ERCP procedure. Recommend discuss with hematology at this the benefit of continuing with anticoagulation therapy.  -resume Eliquis tomorrow per GI post ERCP

## 2022-08-14 NOTE — Consult Note (Signed)
[facility] Reason for Consult: Post ERCP pancreatitis. Referring Physician: ER MD.  Carlos [name] is an 36 y.o. male.  HPI: Carlos [name] is a 36 year old black male with multiple medical problems listed below who underwent an ERCP with stent placement in the ventral duct of the pancreas for a stricture thought to be due to recurrent pancreatitis of unclear etiology.  Patient claims shortly after the procedure he started having abdominal pain nausea and vomiting, that worsened over the last couple of days prompting him to come to the emergency room.  This current emergency room showed evidence of acute pancreatitis with large edematous pancreatic parenchyma with low-attenuation changes suggesting pancreatic necrosis no lobulated collections were noted a pancreatic duct stent was in place calcification the pancreas were noted suggesting chronic pancreatitis.  Chronic portal vein thrombosis without any change from the previous study was noted as well.  Past Medical History:  Diagnosis Date   AKI (acute kidney injury) ([place]) 12/21/2021   Anxiety    Diabetes ([place])    Pancreatitis    Splenic vein thrombosis    Past Surgical History:  Procedure Laterality Date   BIOPSY  12/02/2021   Procedure: BIOPSY;  Surgeon: Irving Copas., MD;  Location: [facility];  Service: Gastroenterology;;   ESOPHAGOGASTRODUODENOSCOPY (EGD) WITH PROPOFOL N/A 12/02/2021   Procedure: ESOPHAGOGASTRODUODENOSCOPY (EGD) WITH PROPOFOL;  Surgeon: Irving Copas., MD;  Location: [facility];  Service: Gastroenterology;  Laterality: N/A;   NO PAST SURGERIES     UPPER ESOPHAGEAL ENDOSCOPIC ULTRASOUND (EUS) N/A 12/02/2021   Procedure: UPPER ESOPHAGEAL ENDOSCOPIC ULTRASOUND (EUS);  Surgeon: Irving Copas., MD;  Location: [facility];  Service: Gastroenterology;  Laterality: N/A;   Family History  Problem Relation Age of Onset   Hypertension Mother    Alcoholism Maternal Uncle    Diabetes Maternal  Grandmother    Diabetes Maternal Grandfather    Diabetes Paternal Grandmother    Diabetes Paternal Grandfather    Colon cancer Neg Hx    Esophageal cancer Neg Hx    Inflammatory bowel disease Neg Hx    Liver disease Neg Hx    Pancreatic cancer Neg Hx    Rectal cancer Neg Hx    Stomach cancer Neg Hx    Social History:  reports that he has been smoking cigars. He started smoking about 19 years ago. He has never used smokeless tobacco. He reports that he does not currently use alcohol. He reports that he does not use drugs.  Allergies:  Allergies  Allergen Reactions   Other Anaphylaxis    [name] - anaphylaxis   Medications: I have reviewed the patient's current medications. Prior to Admission: (Not in a hospital admission)  Scheduled:  apixaban  5 mg Oral BID   insulin aspart  0-15 Units Subcutaneous TID PC & HS   lipase/protease/amylase  36,000 Units Oral TID WC   pantoprazole  80 mg Oral Daily   Continuous:  sodium chloride Stopped (08/14/22 0901)   ZCH:YIFOYDXAJOINO, HYDROmorphone (DILAUDID) injection, morphine injection  Results for orders placed or performed during the hospital encounter of 08/13/22 (from the past 48 hour(s))  Lipase, blood     Status: None   Collection Time: 08/13/22  7:34 PM  Result Value Ref Range   Lipase 40 11 - 51 U/L    Comment: Performed at [facility], [place] [address], [place], [place] [address]  Comprehensive metabolic panel     Status: Abnormal   Collection Time: 08/13/22  7:34 PM  Result  Value Ref Range   Sodium 135 135 - 145 mmol/L   Potassium 3.9 3.5 - 5.1 mmol/L   Chloride 101 98 - 111 mmol/L   CO2 23 22 - 32 mmol/L   Glucose, Bld 180 (H) 70 - 99 mg/dL    Comment: Glucose reference range applies only to samples taken after fasting for at least 8 hours.   BUN 10 6 - 20 mg/dL   Creatinine, Ser 6.62 0.61 - 1.24 mg/dL   Calcium 9.1 8.9 - 94.7 mg/dL   Total Protein 7.7 6.5 - 8.1 g/dL   Albumin 4.2 3.5 - 5.0 g/dL    AST 23 15 - 41 U/L   ALT 14 0 - 44 U/L   Alkaline Phosphatase 74 38 - 126 U/L   Total Bilirubin 1.0 0.3 - 1.2 mg/dL   GFR, Estimated >65 >46 mL/min    Comment: (NOTE) Calculated using the CKD-EPI Creatinine Equation (2021)    Anion gap 11 5 - 15    Comment: Performed at [facility], [address] [place], [place] [address]  CBC with Differential     Status: None   Collection Time: 08/13/22  7:34 PM  Result Value Ref Range   WBC 6.3 4.0 - 10.5 K/uL   RBC 4.28 4.22 - 5.81 MIL/uL   Hemoglobin 14.1 13.0 - 17.0 g/dL   HCT 65.6 81.2 - 75.1 %   MCV 98.6 80.0 - 100.0 fL   MCH 32.9 26.0 - 34.0 pg   MCHC 33.4 30.0 - 36.0 g/dL   RDW 70.0 17.4 - 94.4 %   Platelets 187 150 - 400 K/uL   nRBC 0.0 0.0 - 0.2 %   Neutrophils Relative % 74 %   Neutro Abs 4.6 1.7 - 7.7 K/uL   Lymphocytes Relative 14 %   Lymphs Abs 0.9 0.7 - 4.0 K/uL   Monocytes Relative 7 %   Monocytes Absolute 0.4 0.1 - 1.0 K/uL   Eosinophils Relative 5 %   Eosinophils Absolute 0.3 0.0 - 0.5 K/uL   Basophils Relative 0 %   Basophils Absolute 0.0 0.0 - 0.1 K/uL   Immature Granulocytes 0 %   Abs Immature Granulocytes 0.01 0.00 - 0.07 K/uL    Comment: Performed at [facility], [address], [place], [place] [address]   CT Abdomen Pelvis W Contrast  Result Date: 08/13/2022 CLINICAL DATA:  Postoperative abdominal pain. History of pancreatic stent placed for pancreatitis. EXAM: CT ABDOMEN AND PELVIS WITH CONTRAST TECHNIQUE: Multidetector CT imaging of the abdomen and pelvis was performed using the standard protocol following bolus administration of intravenous contrast. RADIATION DOSE REDUCTION: This exam was performed according to the departmental dose-optimization program which includes automated exposure control, adjustment of the mA and/or kV according to patient size and/or use of iterative reconstruction technique. CONTRAST:  OMNIPAQUE IOHEXOL 300 MG/ML  SOLN COMPARISON:   07/19/2022 FINDINGS: Lower chest: Lung bases are clear. Hepatobiliary: No focal liver abnormality is seen. Chronic portal vein thrombosis with cavernous transformation. No change. Findings are less well demonstrated than on the previous study due to the stage of contrast bolus. No gallstones, gallbladder wall thickening, or biliary dilatation. Pancreas: A pancreatic duct stent is in place. Heterogeneous enlarged appearance of the pancreas with low-attenuation changes suggested in the head and body of the pancreas, likely representing pancreatic necrosis. Mild stranding around the pancreas. Scattered calcifications suggest acute on chronic pancreatitis. Follow-up after resolution of acute process is recommended to exclude underlying pancreatic mass.  Spleen: Normal in size without focal abnormality. Adrenals/Urinary Tract: Adrenal glands are unremarkable. Kidneys are normal, without renal calculi, focal lesion, or hydronephrosis. Bladder is unremarkable. Stomach/Bowel: Stomach is within normal limits. Appendix appears normal. No evidence of bowel wall thickening, distention, or inflammatory changes. Vascular/Lymphatic: No significant vascular findings are present. No enlarged abdominal or pelvic lymph nodes. Reproductive: Prostate is unremarkable. Other: No abdominal wall hernia or abnormality. No abdominopelvic ascites. Musculoskeletal: No acute or significant osseous findings. IMPRESSION: 1. Changes of acute pancreatitis with enlarged edematous pancreatic parenchyma. Low-attenuation changes suggest likely pancreatic necrosis. No loculated collections. A pancreatic duct stent is in place. Calcifications in the pancreas suggest evidence of chronic pancreatitis. 2. Chronic portal venous thrombosis without apparent change since prior study. Electronically Signed   By: Burman Nieves M.D.   On: 08/13/2022 23:21   DG ERCP  Result Date: 08/12/2022 CLINICAL DATA:  History of pancreatitis.  ERCP EXAM: ERCP TECHNIQUE:  Multiple spot images obtained with the fluoroscopic device and submitted for interpretation post-procedure. FLUOROSCOPY TIME: FLUOROSCOPY TIME 3 minutes, 8 seconds (18.7 mGy) COMPARISON:  CT abdomen and pelvis-07/19/2022; MRCP-06/02/2022 FINDINGS: Fourteen spot intraoperative fluoroscopic images of the right upper abdominal quadrant during ERCP are provided for review Initial image demonstrates an ERCP probe overlying the right upper abdominal quadrant. Subsequent images demonstrate selective cannulation of the pancreatic duct, initially with cannulation of a pancreatic head side branch, ultimately with cannulation of the main pancreatic duct. Contrast injection suggests mild dilatation at the mid body of the pancreas Subsequent images demonstrate placement of an internal pancreatic stent. IMPRESSION: ERCP with pancreatic stent placement as above. These images were submitted for radiologic interpretation only. Please see the procedural report for the amount of contrast and the fluoroscopy time utilized. Electronically Signed   By: Simonne Come M.D.   On: 08/12/2022 12:00    Review of Systems  Eyes: Negative.   Respiratory: Negative.    Gastrointestinal:  Positive for abdominal pain, nausea and vomiting. Negative for anal bleeding, blood in stool, constipation and rectal pain.  Endocrine: Negative.   Genitourinary: Negative.   Musculoskeletal: Negative.   Skin: Negative.   Allergic/Immunologic: Negative.   Neurological: Negative.   Hematological: Negative.   Psychiatric/Behavioral: Negative.     Blood pressure (!) 151/104, pulse 79, temperature 98.8 F (37.1 C), temperature source Oral, resp. rate 20, height 6\' 5"  (1.956 m), weight 77.1 kg, SpO2 97 %. Physical Exam Vitals reviewed.  Constitutional:      Appearance: Normal appearance.  HENT:     Head: Normocephalic and atraumatic.     Mouth/Throat:     Mouth: Mucous membranes are dry.  Eyes:     Extraocular Movements: Extraocular movements  intact.     Pupils: Pupils are equal, round, and reactive to light.  Cardiovascular:     Pulses: Normal pulses.     Heart sounds: Normal heart sounds.  Abdominal:     General: Bowel sounds are normal. There is no distension.     Tenderness: There is no abdominal tenderness. There is no guarding.  Musculoskeletal:     Cervical back: Normal range of motion and neck supple.  Skin:    General: Skin is warm and dry.  Neurological:     General: No focal deficit present.     Mental Status: He is alert.   Assessment/Plan: 1) Post ERCP pancreatitis-severe abdominal pain with nausea and vomiting-enlarged edematous pancreatic, on CT done on admission with some low-attenuation changes suggesting pancreatic necrosis pancreatic stent noted in the  creatinine duct with calcifications in the pancreas suggesting chronic pancreatitis [with prior history of alcohol abuse]. The plan is to treat the patient symptomatically keep him NPO. and manage his pain nausea and vomiting.  He seems comfortable at the present time. 2) Chronic portal vein thrombosis on Eliquis. 3)Type 1 diabetes mellitus. 4) Anxiety disorder.  Charna Elizabeth 08/14/2022, 6:45 AM

## 2022-08-14 NOTE — Progress Notes (Signed)
PROGRESS NOTE    Carlos [name]  [id number] DOB: 11/04/1986 DOA: 08/13/2022 PCP: [name], MD    Brief Narrative:  Carlos [name] is a 36 y.o. 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  Of note patient had ERCP to evaluate pancreatic duct strictures by  GI Dr. Rush Landmark yesterday 08/12/2022 in the underwent pancreatic sphincterotomy and placement of pancreatic stent.  Postoperatively he continued complaining of abdominal pain and was sent home with a short course of opioids.  Had minimal relief with pain medication and decided to come to ER due to ongoing pain.  In the ED patient had elevated blood pressures with BP of 160/114.  No leukocytosis or anemia.   Lipase of 40.  CT scan of the abdomen pelvis showed acute on chronic pancreatitis with possible pancreatic necrosis.  Patient did have chronic portal vein thrombosis without any apparent change.  GI was notified from the ED and was admitted hospital for further evaluation and treatment   Assessment and Plan:  Status post ERCP with acute on chronic pancreatitis [facility] Status post ERCP and  pancreatic sphincterotomy and placement of pancreatic stent with [facility] Dr. Rush Landmark on 08/12/2022.Marland Kitchen  Repeat CT scan of the abdomen pelvis slowing worsened pancreatitis and possible necrosis.  Continue IV fluids, pain management, complains of severe pain.  We will back off to n.p.o. at this time. follow GI recommendations.    Uncontrolled type 1 diabetes mellitus with hyperglycemia, with long-term current use of insulin (HCC) Latest hemoglobin A1c of 7.0 on 07/20/2022.  Continue moderate scale sliding scale insulin.  Accu-Cheks diabetic diet.  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  Did not follow-up with  hematology afterwards.  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  Had seen hematology in 2022 November who did not recommend further anticoagulation since his risk of bleeding from dialysis was high and anticoagulation therapy was not likely to improve his outcome.  Patient was again diagnosed of portal vein thrombus on 01/2022 and hematology was consulted.  Patient was started on therapeutic Lovenox inpatient and then discharged with Eliquis.  CT scan of the abdomen pelvis at this time shows unchanged chronic portal vein thrombosis.  Patient had not missed his Eliquis dose except for the ERCP procedure.  Eliquis been restarted after ERCP.  History of alcohol abuse Has not taken alcohol recently.    DVT prophylaxis:  apixaban (ELIQUIS) tablet 5 mg   Code Status:     Code Status: Full Code  Disposition: Home  Status is: Observation  The patient will require care spanning > 2 midnights and should be moved to inpatient because: Intractable abdominal pain, GI evaluation   Family Communication: None at bedside  Consultants:  GI  Procedures:  None  Antimicrobials:  None  Anti-infectives (From admission, onward)    None       Subjective: Today, patient was seen and examined at bedside.  Still complains of epigastric pain.  Has had some fluid in the morning.  Is vomiting but had some nausea which is controlled.  No bowel movement.  Objective: Vitals:   08/14/22 0245 08/14/22 0545 08/14/22 0600 08/14/22 0709  BP: (!) 156/95 (!) 151/104  (!) 150/99  Pulse: 83 79  78  Resp: 20 20  18   Temp:  98.8 F (37.1 C) 98.6 F (37 C)  TempSrc:   Oral Oral  SpO2: 98% 97%  100%  Weight:      Height:       No intake or output data in the 24 hours ending 08/14/22 0831 Filed Weights   08/13/22 1915  Weight: 77.1 kg    Physical Examination: Body mass index is 20.16 kg/m.  General:  Average built, not in obvious distress HENT:   No scleral pallor or icterus  noted. Oral mucosa is moist.  Chest:  Clear breath sounds.  Diminished breath sounds bilaterally. No crackles or wheezes.  CVS: S1 &S2 heard. No murmur.  Regular rate and rhythm. Abdomen: Soft, epigastric tenderness noted,, nondistended.  Bowel sounds are heard.  Guarding noted. Extremities: No cyanosis, clubbing or edema.  Peripheral pulses are palpable. Psych: Alert, awake and oriented, normal mood CNS:  No cranial nerve deficits.  Power equal in all extremities.   Skin: Warm and dry.  No rashes noted.  Data Reviewed:   CBC: Recent Labs  Lab 08/13/22 1934  WBC 6.3  NEUTROABS 4.6  HGB 14.1  HCT 42.2  MCV 98.6  PLT 187    Basic Metabolic Panel: Recent Labs  Lab 08/13/22 1934 08/14/22 0625  NA 135 132*  K 3.9 3.5  CL 101 102  CO2 23 22  GLUCOSE 180* 154*  BUN 10 8  CREATININE 0.85 0.64  CALCIUM 9.1 8.1*    Liver Function Tests: Recent Labs  Lab 08/13/22 1934 08/14/22 0625  AST 23 15  ALT 14 13  ALKPHOS 74 61  BILITOT 1.0 1.1  PROT 7.7 6.5  ALBUMIN 4.2 3.3*     Radiology Studies: CT Abdomen Pelvis W Contrast  Result Date: 08/13/2022 CLINICAL DATA:  Postoperative abdominal pain. History of pancreatic stent placed for pancreatitis. EXAM: CT ABDOMEN AND PELVIS WITH CONTRAST TECHNIQUE: Multidetector CT imaging of the abdomen and pelvis was performed using the standard protocol following bolus administration of intravenous contrast. RADIATION DOSE REDUCTION: This exam was performed according to the departmental dose-optimization program which includes automated exposure control, adjustment of the mA and/or kV according to patient size and/or use of iterative reconstruction technique. CONTRAST:  OMNIPAQUE IOHEXOL 300 MG/ML  SOLN COMPARISON:  07/19/2022 FINDINGS: Lower chest: Lung bases are clear. Hepatobiliary: No focal liver abnormality is seen. Chronic portal vein thrombosis with cavernous transformation. No change. Findings are less well demonstrated than on  the previous study due to the stage of contrast bolus. No gallstones, gallbladder wall thickening, or biliary dilatation. Pancreas: A pancreatic duct stent is in place. Heterogeneous enlarged appearance of the pancreas with low-attenuation changes suggested in the head and body of the pancreas, likely representing pancreatic necrosis. Mild stranding around the pancreas. Scattered calcifications suggest acute on chronic pancreatitis. Follow-up after resolution of acute process is recommended to exclude underlying pancreatic mass. Spleen: Normal in size without focal abnormality. Adrenals/Urinary Tract: Adrenal glands are unremarkable. Kidneys are normal, without renal calculi, focal lesion, or hydronephrosis. Bladder is unremarkable. Stomach/Bowel: Stomach is within normal limits. Appendix appears normal. No evidence of bowel wall thickening, distention, or inflammatory changes. Vascular/Lymphatic: No significant vascular findings are present. No enlarged abdominal or pelvic lymph nodes. Reproductive: Prostate is unremarkable. Other: No abdominal wall hernia or abnormality. No abdominopelvic ascites. Musculoskeletal: No acute or significant osseous findings. IMPRESSION: 1. Changes of acute pancreatitis with enlarged edematous pancreatic parenchyma. Low-attenuation changes suggest likely pancreatic necrosis. No loculated collections. A pancreatic duct stent is in place. Calcifications in  the pancreas suggest evidence of chronic pancreatitis. 2. Chronic portal venous thrombosis without apparent change since prior study. Electronically Signed   By: Lucienne Capers M.D.   On: 08/13/2022 23:21   DG ERCP  Result Date: 08/12/2022 CLINICAL DATA:  History of pancreatitis.  ERCP EXAM: ERCP TECHNIQUE: Multiple spot images obtained with the fluoroscopic device and submitted for interpretation post-procedure. FLUOROSCOPY TIME: FLUOROSCOPY TIME 3 minutes, 8 seconds (18.7 mGy) COMPARISON:  CT abdomen and pelvis-07/19/2022;  MRCP-06/02/2022 FINDINGS: Fourteen spot intraoperative fluoroscopic images of the right upper abdominal quadrant during ERCP are provided for review Initial image demonstrates an ERCP probe overlying the right upper abdominal quadrant. Subsequent images demonstrate selective cannulation of the pancreatic duct, initially with cannulation of a pancreatic head side branch, ultimately with cannulation of the main pancreatic duct. Contrast injection suggests mild dilatation at the mid body of the pancreas Subsequent images demonstrate placement of an internal pancreatic stent. IMPRESSION: ERCP with pancreatic stent placement as above. These images were submitted for radiologic interpretation only. Please see the procedural report for the amount of contrast and the fluoroscopy time utilized. Electronically Signed   By: Sandi Mariscal M.D.   On: 08/12/2022 12:00      LOS: 0 days    Flora Lipps, MD [facility] 08/14/2022, 8:31 AM

## 2022-08-14 NOTE — Assessment & Plan Note (Signed)
Post ERCP acute on chronic pancreatitis-underwent pancreatic sphincterotomy and placement of pancreatic stent with [facility] Dr. Rush Landmark yesterday 08/12/2022. -repeat CT A/P shows worsen pancreatitis and possible necrosis  -aggressive IV fluid hydration -PRN IV morphine and Dilaudid for pain control -advance diet as tolerated

## 2022-08-14 NOTE — Assessment & Plan Note (Signed)
Course that he has been abstinence for quite some time

## 2022-08-14 NOTE — Assessment & Plan Note (Signed)
Last HbA1C of 7 on 07/20/2022 -Moderate SSI ACHS

## 2022-08-15 DIAGNOSIS — I81 Portal vein thrombosis: Secondary | ICD-10-CM | POA: Diagnosis not present

## 2022-08-15 DIAGNOSIS — K859 Acute pancreatitis without necrosis or infection, unspecified: Secondary | ICD-10-CM | POA: Diagnosis not present

## 2022-08-15 DIAGNOSIS — R52 Pain, unspecified: Secondary | ICD-10-CM

## 2022-08-15 DIAGNOSIS — E1065 Type 1 diabetes mellitus with hyperglycemia: Secondary | ICD-10-CM | POA: Diagnosis not present

## 2022-08-15 DIAGNOSIS — F1011 Alcohol abuse, in remission: Secondary | ICD-10-CM | POA: Diagnosis not present

## 2022-08-15 LAB — MAGNESIUM: Magnesium: 1.7 mg/dL (ref 1.7–2.4)

## 2022-08-15 LAB — GLUCOSE, CAPILLARY
Glucose-Capillary: 193 mg/dL — ABNORMAL HIGH (ref 70–99)
Glucose-Capillary: 181 mg/dL — ABNORMAL HIGH (ref 70–99)
Glucose-Capillary: 259 mg/dL — ABNORMAL HIGH (ref 70–99)
Glucose-Capillary: 112 mg/dL — ABNORMAL HIGH (ref 70–99)

## 2022-08-15 LAB — COMPREHENSIVE METABOLIC PANEL
ALT: 12 U/L (ref 0–44)
AST: 12 U/L — ABNORMAL LOW (ref 15–41)
Albumin: 3.3 g/dL — ABNORMAL LOW (ref 3.5–5.0)
Alkaline Phosphatase: 58 U/L (ref 38–126)
Anion gap: 9 (ref 5–15)
BUN: 8 mg/dL (ref 6–20)
CO2: 24 mmol/L (ref 22–32)
Calcium: 8.4 mg/dL — ABNORMAL LOW (ref 8.9–10.3)
Chloride: 102 mmol/L (ref 98–111)
Creatinine, Ser: 0.7 mg/dL (ref 0.61–1.24)
GFR, Estimated: >60 mL/min (ref >60)
Glucose, Bld: 188 mg/dL — ABNORMAL HIGH (ref 70–99)
Potassium: 3.5 mmol/L (ref 3.5–5.1)
Sodium: 135 mmol/L (ref 135–145)
Total Bilirubin: 0.8 mg/dL (ref 0.3–1.2)
Total Protein: 6.5 g/dL (ref 6.5–8.1)

## 2022-08-15 LAB — CBC
HCT: 38.2 % — ABNORMAL LOW (ref 39.0–52.0)
Hemoglobin: 12.6 g/dL — ABNORMAL LOW (ref 13.0–17.0)
MCH: 33.1 pg (ref 26.0–34.0)
MCHC: 33 g/dL (ref 30.0–36.0)
MCV: 100.3 fL — ABNORMAL HIGH (ref 80.0–100.0)
Platelets: 140 10*3/uL — ABNORMAL LOW (ref 150–400)
RBC: 3.81 MIL/uL — ABNORMAL LOW (ref 4.22–5.81)
RDW: 12.3 % (ref 11.5–15.5)
WBC: 7.2 10*3/uL (ref 4.0–10.5)
nRBC: 0 % (ref 0.0–0.2)

## 2022-08-15 MED ORDER — HYDRALAZINE HCL 20 MG/ML IJ SOLN
10.0000 mg | Freq: Four times a day (QID) | INTRAMUSCULAR | Status: DC | PRN
  Administered 2022-08-15: 10 mg via INTRAVENOUS
  Filled 2022-08-15: qty 1

## 2022-08-15 MED ORDER — DIPHENHYDRAMINE HCL 50 MG/ML IJ SOLN: 12.5000 mg | Freq: Four times a day (QID) | INTRAMUSCULAR | Status: DC | PRN

## 2022-08-15 MED ORDER — SODIUM CHLORIDE 0.9% FLUSH: 9.0000 mL | INTRAVENOUS | Status: DC | PRN

## 2022-08-15 MED ORDER — DIPHENHYDRAMINE HCL 12.5 MG/5ML PO ELIX: 12.5000 mg | ORAL_SOLUTION | Freq: Four times a day (QID) | ORAL | Status: DC | PRN

## 2022-08-15 MED ORDER — HYDROMORPHONE 1 MG/ML IV SOLN
INTRAVENOUS | Status: DC
  Administered 2022-08-15: 1 mg via INTRAVENOUS
  Administered 2022-08-15: 2.3 mg via INTRAVENOUS
  Administered 2022-08-16: 3.9 mg via INTRAVENOUS
  Filled 2022-08-15: qty 30

## 2022-08-15 MED ORDER — NALOXONE HCL 0.4 MG/ML IJ SOLN: 0.4000 mg | INTRAMUSCULAR | Status: DC | PRN

## 2022-08-15 MED ORDER — ONDANSETRON HCL 4 MG/2ML IJ SOLN: 4.0000 mg | Freq: Four times a day (QID) | INTRAMUSCULAR | Status: DC | PRN

## 2022-08-15 NOTE — Progress Notes (Signed)
  This encounter was created in error - please disregard. No show 

## 2022-08-15 NOTE — Progress Notes (Signed)
PROGRESS NOTE FOR [facility]  Subjective: Still with 10/10 pain.  Baseline is 6-7/10.  Objective: Vital signs in last 24 hours: Temp:  [98.1 F (36.7 C)-98.3 F (36.8 C)] 98.1 F (36.7 C) (10/08 0520) Pulse Rate:  [71-82] 82 (10/08 0520) Resp:  [16-18] 16 (10/08 0520) BP: (144-153)/(92-103) 151/100 (10/08 0520) SpO2:  [100 %] 100 % (10/08 0520) Last BM Date : 08/14/22  Intake/Output from previous day: 10/07 0701 - 10/08 0700 In: 1319.8 [I.V.:1319.8] Out: -  Intake/Output this shift: Total I/O In: 1880.5 [I.V.:1880.5] Out: -   General appearance: alert and moderate distress GI: tender in the epigastric region  Lab Results: Recent Labs    08/13/22 1934 08/15/22 0328  WBC 6.3 7.2  HGB 14.1 12.6*  HCT 42.2 38.2*  PLT 187 140*   BMET Recent Labs    08/13/22 1934 08/14/22 0625 08/15/22 0328  NA 135 132* 135  K 3.9 3.5 3.5  CL 101 102 102  CO2 23 22 24   GLUCOSE 180* 154* 188*  BUN 10 8 8   CREATININE 0.85 0.64 0.70  CALCIUM 9.1 8.1* 8.4*   LFT Recent Labs    08/15/22 0328  PROT 6.5  ALBUMIN 3.3*  AST 12*  ALT 12  ALKPHOS 58  BILITOT 0.8   PT/INR No results for input(s): "LABPROT", "INR" in the last 72 hours. Hepatitis Panel No results for input(s): "HEPBSAG", "HCVAB", "HEPAIGM", "HEPBIGM" in the last 72 hours. C-Diff No results for input(s): "CDIFFTOX" in the last 72 hours. Fecal Lactopherrin No results for input(s): "FECLLACTOFRN" in the last 72 hours.  Studies/Results: CT Abdomen Pelvis W Contrast  Result Date: 08/13/2022 CLINICAL DATA:  Postoperative abdominal pain. History of pancreatic stent placed for pancreatitis. EXAM: CT ABDOMEN AND PELVIS WITH CONTRAST TECHNIQUE: Multidetector CT imaging of the abdomen and pelvis was performed using the standard protocol following bolus administration of intravenous contrast. RADIATION DOSE REDUCTION: This exam was performed according to the departmental dose-optimization program which includes automated  exposure control, adjustment of the mA and/or kV according to patient size and/or use of iterative reconstruction technique. CONTRAST:  176mL OMNIPAQUE IOHEXOL 300 MG/ML  SOLN COMPARISON:  07/19/2022 FINDINGS: Lower chest: Lung bases are clear. Hepatobiliary: No focal liver abnormality is seen. Chronic portal vein thrombosis with cavernous transformation. No change. Findings are less well demonstrated than on the previous study due to the stage of contrast bolus. No gallstones, gallbladder wall thickening, or biliary dilatation. Pancreas: A pancreatic duct stent is in place. Heterogeneous enlarged appearance of the pancreas with low-attenuation changes suggested in the head and body of the pancreas, likely representing pancreatic necrosis. Mild stranding around the pancreas. Scattered calcifications suggest acute on chronic pancreatitis. Follow-up after resolution of acute process is recommended to exclude underlying pancreatic mass. Spleen: Normal in size without focal abnormality. Adrenals/Urinary Tract: Adrenal glands are unremarkable. Kidneys are normal, without renal calculi, focal lesion, or hydronephrosis. Bladder is unremarkable. Stomach/Bowel: Stomach is within normal limits. Appendix appears normal. No evidence of bowel wall thickening, distention, or inflammatory changes. Vascular/Lymphatic: No significant vascular findings are present. No enlarged abdominal or pelvic lymph nodes. Reproductive: Prostate is unremarkable. Other: No abdominal wall hernia or abnormality. No abdominopelvic ascites. Musculoskeletal: No acute or significant osseous findings. IMPRESSION: 1. Changes of acute pancreatitis with enlarged edematous pancreatic parenchyma. Low-attenuation changes suggest likely pancreatic necrosis. No loculated collections. A pancreatic duct stent is in place. Calcifications in the pancreas suggest evidence of chronic pancreatitis. 2. Chronic portal venous thrombosis without apparent change since prior  study. Electronically Signed   By: Burman Nieves M.D.   On: 08/13/2022 23:21    Medications: Scheduled:  apixaban  5 mg Oral BID   HYDROmorphone   Intravenous Q4H   insulin aspart  0-15 Units Subcutaneous TID PC & HS   lipase/protease/amylase  36,000 Units Oral TID WC   pantoprazole  80 mg Oral Daily   Continuous:  sodium chloride 150 mL/hr at 08/15/22 1158    Assessment/Plan: 1) Post ERCP pancreatitis. 2) PD stricture. 3) History of ETOH abuse.   He is struggling with the pain.  It is typically managed with hydromorphone PRN and a heating pad, but the pain control only lasts one hour.  He will be advanced to a PCA.  LOS: 1 day   Tania Perrott D 08/15/2022, 12:20 PM

## 2022-08-15 NOTE — Plan of Care (Signed)
  Problem: Coping: Goal: Ability to adjust to condition or change in health will improve 08/15/2022 0738 by Elza Rafter, RN Outcome: Progressing 08/15/2022 0732 by Elza Rafter, RN Outcome: Progressing   Problem: Fluid Volume: Goal: Ability to maintain a balanced intake and output will improve 08/15/2022 0738 by Elza Rafter, RN Outcome: Progressing 08/15/2022 0732 by Elza Rafter, RN Outcome: Progressing   Problem: Nutritional: Goal: Maintenance of adequate nutrition will improve 08/15/2022 0738 by Elza Rafter, RN Outcome: Progressing 08/15/2022 0732 by Elza Rafter, RN Outcome: Progressing   Problem: Activity: Goal: Risk for activity intolerance will decrease 08/15/2022 0738 by Elza Rafter, RN Outcome: Progressing 08/15/2022 0732 by Elza Rafter, RN Outcome: Progressing   Problem: Clinical Measurements: Goal: Respiratory complications will improve 08/15/2022 0738 by Elza Rafter, RN Outcome: Progressing 08/15/2022 0732 by Elza Rafter, RN Outcome: Progressing   Problem: Pain Managment: Goal: General experience of comfort will improve 08/15/2022 0738 by Elza Rafter, RN Outcome: Progressing 08/15/2022 0732 by Elza Rafter, RN Outcome: Progressing

## 2022-08-15 NOTE — Plan of Care (Signed)
  Problem: Skin Integrity: Goal: Risk for impaired skin integrity will decrease Outcome: Progressing   Problem: Education: Goal: Knowledge of General Education information will improve Description: Including pain rating scale, medication(s)/side effects and non-pharmacologic comfort measures Outcome: Progressing   

## 2022-08-15 NOTE — Progress Notes (Signed)
PROGRESS NOTE    Carlos [name]  WGY:[id number] DOB: [birth year] DOA: 08/13/2022 PCP: [name], MD    Brief Narrative:  Carlos [name] is a 36 y.o. male with medical history significant of portal vein thrombus on Eliquis, chronic pancreatitis, type 1 diabetes, history of alcohol abuse, pancreatic duct stricture s/p stent presented to the hospital with acute abdominal pain.  Of note, patient had ERCP to evaluate pancreatic duct strictures by  GI Dr. Rush Landmark yesterday 08/12/2022 in the underwent pancreatic sphincterotomy and placement of pancreatic stent.  Postoperatively he continued complaining of abdominal pain and was sent home with a short course of opioids.  Had minimal relief with pain medication and decided to come to ER due to ongoing pain.  In the ED, patient had elevated blood pressures with BP of 160/114.  No leukocytosis or anemia.   Lipase of 40.  CT scan of the abdomen pelvis showed acute on chronic pancreatitis with possible pancreatic necrosis.  Patient did have chronic portal vein thrombosis without any apparent change.  GI was notified from the ED and was admitted hospital for further evaluation and treatment   Assessment and Plan:  Status post ERCP with acute on chronic pancreatitis [facility]) Status post ERCP and  pancreatic sphincterotomy and placement of pancreatic stent with [facility] Dr. Rush Landmark on 08/12/2022.Marland Kitchen  Repeat CT scan of the abdomen pelvis slowing worsened pancreatitis and possible necrosis.  Continue IV fluids, pain management, patient still complains of severe pain.  Currently NPO.  Follow GI recommendations.    Uncontrolled type 1 diabetes mellitus with hyperglycemia, with long-term current use of insulin (HCC) Latest hemoglobin A1c of 7.0 on 07/20/2022.  Continue moderate scale sliding scale insulin.  Accu-Cheks diabetic diet.  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   Did not follow-up with hematology afterwards.  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  Had seen hematology in 2022 November who did not recommend further anticoagulation since his risk of bleeding  was high and anticoagulation therapy was not likely to improve his outcome.  Patient was again diagnosed of portal vein thrombus on 01/2022 and hematology was really consulted.  Patient was then started on therapeutic Lovenox inpatient and then discharged with Eliquis.  CT scan of the abdomen pelvis at this time shows unchanged chronic portal vein thrombosis.  Patient had not missed his Eliquis dose except for the ERCP procedure.  Eliquis been restarted after ERCP.  History of alcohol abuse Has not taken alcohol recently.    DVT prophylaxis:  apixaban (ELIQUIS) tablet 5 mg   Code Status:     Code Status: Full Code  Disposition: Home  Status is: Inpatient  The patient is inpatient because: Intractable abdominal pain, GI evaluation, IV narcotics   Family Communication:  None at bedside  Consultants:  GI  Procedures:  None  Antimicrobials:  None  Anti-infectives (From admission, onward)    None       Subjective: Today, patient was seen and examined at bedside.  Still complains of abdominal pain.  Denies any nausea vomiting fever or chills.  States that his pain medication is not lasting enough but has chronic abdominal pain at baseline.  Objective: Vitals:   08/14/22 1245 08/14/22 1615 08/14/22 2212 08/15/22 0520  BP: (!) 153/101 (!) 148/92 (!) 144/103 (!) 151/100  Pulse: 77 81 71 82  Resp: 18 18 18 16   Temp:  98.3 F (36.8 C) 98.1 F (36.7 C)  TempSrc:    Oral  SpO2: 100% 100% 100% 100%  Weight:      Height:        Intake/Output Summary (Last 24 hours) at 08/15/2022 1009 Last data filed at 08/15/2022 0735 Gross per 24 hour  Intake 2211.11 ml  Output --  Net 2211.11 ml   Filed Weights   08/13/22 1915  Weight: 77.1 kg    Physical  Examination: Body mass index is 20.16 kg/m.   General:  Average built, not in obvious distress HENT:   No scleral pallor or icterus noted. Oral mucosa is moist.  Chest: Clear breath sounds.  No crackles or wheezes.  CVS: S1 &S2 heard. No murmur.  Regular rate and rhythm. Abdomen: Gastric tenderness noticed with guarding. Extremities: No cyanosis, clubbing or edema.  Peripheral pulses are palpable. Psych: Alert, awake and oriented, normal mood CNS:  No cranial nerve deficits.  Power equal in all extremities.   Skin: Warm and dry.  No rashes noted.  Data Reviewed:   CBC: Recent Labs  Lab 08/13/22 1934 08/15/22 0328  WBC 6.3 7.2  NEUTROABS 4.6  --   HGB 14.1 12.6*  HCT 42.2 38.2*  MCV 98.6 100.3*  PLT 187 140*     Basic Metabolic Panel: Recent Labs  Lab 08/13/22 1934 08/14/22 0625 08/15/22 0328  NA 135 132* 135  K 3.9 3.5 3.5  CL 101 102 102  CO2 23 22 24   GLUCOSE 180* 154* 188*  BUN 10 8 8   CREATININE 0.85 0.64 0.70  CALCIUM 9.1 8.1* 8.4*  MG  --   --  1.7     Liver Function Tests: Recent Labs  Lab 08/13/22 1934 08/14/22 0625 08/15/22 0328  AST 23 15 12*  ALT 14 13 12   ALKPHOS 74 61 58  BILITOT 1.0 1.1 0.8  PROT 7.7 6.5 6.5  ALBUMIN 4.2 3.3* 3.3*      Radiology Studies: CT Abdomen Pelvis W Contrast  Result Date: 08/13/2022 CLINICAL DATA:  Postoperative abdominal pain. History of pancreatic stent placed for pancreatitis. EXAM: CT ABDOMEN AND PELVIS WITH CONTRAST TECHNIQUE: Multidetector CT imaging of the abdomen and pelvis was performed using the standard protocol following bolus administration of intravenous contrast. RADIATION DOSE REDUCTION: This exam was performed according to the departmental dose-optimization program which includes automated exposure control, adjustment of the mA and/or kV according to patient size and/or use of iterative reconstruction technique. CONTRAST:  10/15/22 OMNIPAQUE IOHEXOL 300 MG/ML  SOLN COMPARISON:  07/19/2022 FINDINGS:  Lower chest: Lung bases are clear. Hepatobiliary: No focal liver abnormality is seen. Chronic portal vein thrombosis with cavernous transformation. No change. Findings are less well demonstrated than on the previous study due to the stage of contrast bolus. No gallstones, gallbladder wall thickening, or biliary dilatation. Pancreas: A pancreatic duct stent is in place. Heterogeneous enlarged appearance of the pancreas with low-attenuation changes suggested in the head and body of the pancreas, likely representing pancreatic necrosis. Mild stranding around the pancreas. Scattered calcifications suggest acute on chronic pancreatitis. Follow-up after resolution of acute process is recommended to exclude underlying pancreatic mass. Spleen: Normal in size without focal abnormality. Adrenals/Urinary Tract: Adrenal glands are unremarkable. Kidneys are normal, without renal calculi, focal lesion, or hydronephrosis. Bladder is unremarkable. Stomach/Bowel: Stomach is within normal limits. Appendix appears normal. No evidence of bowel wall thickening, distention, or inflammatory changes. Vascular/Lymphatic: No significant vascular findings are present. No enlarged abdominal or pelvic lymph nodes. Reproductive: Prostate  is unremarkable. Other: No abdominal wall hernia or abnormality. No abdominopelvic ascites. Musculoskeletal: No acute or significant osseous findings. IMPRESSION: 1. Changes of acute pancreatitis with enlarged edematous pancreatic parenchyma. Low-attenuation changes suggest likely pancreatic necrosis. No loculated collections. A pancreatic duct stent is in place. Calcifications in the pancreas suggest evidence of chronic pancreatitis. 2. Chronic portal venous thrombosis without apparent change since prior study. Electronically Signed   By: Burman Nieves M.D.   On: 08/13/2022 23:21      LOS: 1 day    Joycelyn Das, MD [facility] 08/15/2022, 10:09 AM

## 2022-08-15 NOTE — Plan of Care (Signed)
  Problem: Education: Goal: Knowledge of General Education information will improve Description: Including pain rating scale, medication(s)/side effects and non-pharmacologic comfort measures Outcome: Progressing   Problem: Activity: Goal: Risk for activity intolerance will decrease Outcome: Progressing   Problem: Pain Managment: Goal: General experience of comfort will improve Outcome: Progressing   

## 2022-08-16 ENCOUNTER — Encounter (HOSPITAL_COMMUNITY): Payer: Self-pay | Admitting: Gastroenterology

## 2022-08-16 DIAGNOSIS — E1065 Type 1 diabetes mellitus with hyperglycemia: Secondary | ICD-10-CM | POA: Diagnosis not present

## 2022-08-16 DIAGNOSIS — R52 Pain, unspecified: Secondary | ICD-10-CM | POA: Diagnosis present

## 2022-08-16 DIAGNOSIS — K9189 Other postprocedural complications and disorders of digestive system: Secondary | ICD-10-CM

## 2022-08-16 DIAGNOSIS — I81 Portal vein thrombosis: Secondary | ICD-10-CM | POA: Diagnosis not present

## 2022-08-16 DIAGNOSIS — K859 Acute pancreatitis without necrosis or infection, unspecified: Secondary | ICD-10-CM | POA: Diagnosis not present

## 2022-08-16 DIAGNOSIS — F1011 Alcohol abuse, in remission: Secondary | ICD-10-CM | POA: Diagnosis not present

## 2022-08-16 LAB — COMPREHENSIVE METABOLIC PANEL
ALT: 11 U/L (ref 0–44)
AST: 14 U/L — ABNORMAL LOW (ref 15–41)
Albumin: 3.2 g/dL — ABNORMAL LOW (ref 3.5–5.0)
Alkaline Phosphatase: 66 U/L (ref 38–126)
Anion gap: 7 (ref 5–15)
BUN: 6 mg/dL (ref 6–20)
CO2: 27 mmol/L (ref 22–32)
Calcium: 8.5 mg/dL — ABNORMAL LOW (ref 8.9–10.3)
Chloride: 102 mmol/L (ref 98–111)
Creatinine, Ser: 0.82 mg/dL (ref 0.61–1.24)
GFR, Estimated: >60 mL/min (ref >60)
Glucose, Bld: 252 mg/dL — ABNORMAL HIGH (ref 70–99)
Potassium: 3.7 mmol/L (ref 3.5–5.1)
Sodium: 136 mmol/L (ref 135–145)
Total Bilirubin: 0.6 mg/dL (ref 0.3–1.2)
Total Protein: 6.3 g/dL — ABNORMAL LOW (ref 6.5–8.1)

## 2022-08-16 LAB — CBC
HCT: 35.5 % — ABNORMAL LOW (ref 39.0–52.0)
Hemoglobin: 11.6 g/dL — ABNORMAL LOW (ref 13.0–17.0)
MCH: 33.3 pg (ref 26.0–34.0)
MCHC: 32.7 g/dL (ref 30.0–36.0)
MCV: 102 fL — ABNORMAL HIGH (ref 80.0–100.0)
Platelets: 140 10*3/uL — ABNORMAL LOW (ref 150–400)
RBC: 3.48 MIL/uL — ABNORMAL LOW (ref 4.22–5.81)
RDW: 12.4 % (ref 11.5–15.5)
WBC: 4.1 10*3/uL (ref 4.0–10.5)
nRBC: 0 % (ref 0.0–0.2)

## 2022-08-16 LAB — MAGNESIUM: Magnesium: 1.7 mg/dL (ref 1.7–2.4)

## 2022-08-16 LAB — GLUCOSE, CAPILLARY
Glucose-Capillary: 197 mg/dL — ABNORMAL HIGH (ref 70–99)
Glucose-Capillary: 138 mg/dL — ABNORMAL HIGH (ref 70–99)
Glucose-Capillary: 123 mg/dL — ABNORMAL HIGH (ref 70–99)
Glucose-Capillary: 179 mg/dL — ABNORMAL HIGH (ref 70–99)

## 2022-08-16 MED ORDER — HYDROMORPHONE 1 MG/ML IV SOLN
INTRAVENOUS | Status: DC
  Administered 2022-08-16: 4.03 mg via INTRAVENOUS
  Administered 2022-08-16: 6.69 mg via INTRAVENOUS
  Administered 2022-08-17 (×2): 3.2 mg via INTRAVENOUS
  Administered 2022-08-17: 3.03 mg via INTRAVENOUS
  Administered 2022-08-17: 3.4 mg via INTRAVENOUS
  Administered 2022-08-17: 3.45 mg via INTRAVENOUS
  Administered 2022-08-18: 2.71 mg via INTRAVENOUS
  Administered 2022-08-18: 3.1 mg via INTRAVENOUS
  Administered 2022-08-18: 4.54 mg via INTRAVENOUS
  Filled 2022-08-16 (×2): qty 30

## 2022-08-16 NOTE — Progress Notes (Signed)
PROGRESS NOTE    Carlos [name]  WRU:[id number] DOB: 1986-07-19 DOA: 08/13/2022 PCP: [name], MD    Brief Narrative:  Carlos [name] is a 36 y.o. male with medical history significant of portal vein thrombus on Eliquis, chronic pancreatitis, type 1 diabetes, history of alcohol abuse, pancreatic duct stricture s/p stent presented to the hospital with acute abdominal pain.  Of note, patient had ERCP to evaluate pancreatic duct strictures by  GI Dr. Rush Landmark yesterday 08/12/2022 in the underwent pancreatic sphincterotomy and placement of pancreatic stent.  Postoperatively he continued complaining of abdominal pain and was sent home with a short course of opioids.  Had minimal relief with pain medication and decided to come to ER due to ongoing pain.  In the ED, patient had elevated blood pressures with BP of 160/114.  No leukocytosis or anemia.   Lipase of 40.  CT scan of the abdomen pelvis showed acute on chronic pancreatitis with possible pancreatic necrosis.  Patient did have chronic portal vein thrombosis without any apparent change.  GI was notified from the ED and was admitted hospital for further evaluation and treatment   Assessment and Plan: Principal Problem:   Acute on chronic pancreatitis ([place]) Active Problems:   Uncontrolled type 1 diabetes mellitus with hyperglycemia, with long-term current use of insulin (HCC)   Portal vein thrombosis   History of alcohol abuse   Intractable pain   Status post ERCP with acute on chronic pancreatitis with intractable pain. Status post ERCP and  pancreatic sphincterotomy and placement of pancreatic stent with [facility] Dr. Rush Landmark on 08/12/2022.Marland Kitchen  Repeat CT scan of the abdomen pelvis slowing worsened pancreatitis and possible necrosis.  Patient was on Dilaudid p.o. which has been changed to Dilaudid pump but he still complains of pain.  Add basal dose of 0.25 mg as well.  Uncontrolled type 1 diabetes mellitus with hyperglycemia, with long-term  current use of insulin (HCC) Latest hemoglobin A1c of 7.0 on 07/20/2022.  Continue moderate scale sliding scale insulin.  Accu-Cheks diabetic diet.  Latest POC glucose of 197  Portal vein thrombosis CT scan of the abdomen and pelvis with unchanged chronic portal vein thrombosis at this time.  Continue Eliquis.  History of alcohol abuse Has not taken alcohol recently.    DVT prophylaxis:  apixaban (ELIQUIS) tablet 5 mg   Code Status:     Code Status: Full Code  Disposition: Home  Status is: Inpatient  The patient is inpatient because: Intractable abdominal pain, GI follow-up, IV Dilaudid  pump   Family Communication:  None at bedside  Consultants:  GI  Procedures:  None  Antimicrobials:  None  Anti-infectives (From admission, onward)    None       Subjective: Today, patient was seen and examined at bedside.  Still complains of pain despite being on Dilaudid pump.  Denies any nausea vomiting fever chills.  Has had bowel movement.   Vitals:   08/16/22 0345 08/16/22 0355 08/16/22 0412 08/16/22 0854  BP:  (!) 151/102 (!) 138/96 137/85  Pulse:  72 68 78  Resp: 12 14  16   Temp:  98.8 F (37.1 C)  98.1 F (36.7 C)  TempSrc:  Oral  Oral  SpO2: 99% 99%  98%  Weight:      Height:        Intake/Output Summary (Last 24 hours) at 08/16/2022 1042 Last data filed at 08/16/2022 0620 Gross per 24 hour  Intake 3567.48 ml  Output --  Net 3567.48 ml  Filed Weights   08/13/22 1915  Weight: 77.1 kg    Physical Examination: Body mass index is 20.16 kg/m.   General:  Average built, not in obvious distress HENT:   No scleral pallor or icterus noted. Oral mucosa is moist.  Chest:  Clear breath sounds.  Diminished breath sounds bilaterally. No crackles or wheezes.  CVS: S1 &S2 heard. No murmur.  Regular rate and rhythm. Abdomen: Guarding and rigidity of the epigastric region with tenderness.  Nondistended.  Bowel sounds are heard.   Extremities: No cyanosis, clubbing  or edema.  Peripheral pulses are palpable. Psych: Alert, awake and oriented, normal mood CNS:  No cranial nerve deficits.  Power equal in all extremities.   Skin: Warm and dry.  No rashes noted.  Data Reviewed:   CBC: Recent Labs  Lab 08/13/22 1934 08/15/22 0328 08/16/22 0316  WBC 6.3 7.2 4.1  NEUTROABS 4.6  --   --   HGB 14.1 12.6* 11.6*  HCT 42.2 38.2* 35.5*  MCV 98.6 100.3* 102.0*  PLT 187 140* 140*    Basic Metabolic Panel: Recent Labs  Lab 08/13/22 1934 08/14/22 0625 08/15/22 0328 08/16/22 0316  NA 135 132* 135 136  K 3.9 3.5 3.5 3.7  CL 101 102 102 102  CO2 23 22 24 27   GLUCOSE 180* 154* 188* 252*  BUN 10 8 8 6   CREATININE 0.85 0.64 0.70 0.82  CALCIUM 9.1 8.1* 8.4* 8.5*  MG  --   --  1.7 1.7    Liver Function Tests: Recent Labs  Lab 08/13/22 1934 08/14/22 0625 08/15/22 0328 08/16/22 0316  AST 23 15 12* 14*  ALT 14 13 12 11   ALKPHOS 74 61 58 66  BILITOT 1.0 1.1 0.8 0.6  PROT 7.7 6.5 6.5 6.3*  ALBUMIN 4.2 3.3* 3.3* 3.2*     Radiology Studies: No results found.    LOS: 2 days    Flora Lipps, MD [facility] 08/16/2022, 10:42 AM

## 2022-08-16 NOTE — Plan of Care (Signed)
  Problem: Activity: Goal: Risk for activity intolerance will decrease Outcome: Progressing   Problem: Nutrition: Goal: Adequate nutrition will be maintained Outcome: Progressing   

## 2022-08-16 NOTE — Progress Notes (Addendum)
Daily Progress Note  Hospital Day: 4  Chief Complaint: post-ercp pancreatitis  Brief History 36 yo male known to Dr. 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 Seen in consultation by GI on 10/7. Patient was previously followed by Dr. 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 Cause of pancreatitis wasn't clear. He was referred to [facility] for ERCP with manometry. [facility] saw him 7/31, felt there was a PD stricture based on MRI / MRCP results. He underwent ERCP with stent placement in ventral duct of pancreas for a stricture. Developed abdominal pain, N/V and subsequently admitted here with acute pancreatitis . CT scan with some changes of acute pancreatitis with enlarged edematous pancreatic parenchyma. Low-attenuation changes suggest likely pancreatic necrosis. No loculated collections. A pancreatic duct stent is in place. Calcifications in the pancreas suggest evidence of chronic pancreatitis.  Assessment / Plan  # 36 yo male with post-ERCP pancreatitis superimposed on  chronic pancreatitis. Underwent ERCP with stent placement in ventral duct of pancreas for PD stricture  on 10/5.     Afebrile. WBC normal. Renal function normal.  Using PCA and heating bad but still struggling to get comfortable.   # Mild macrocytic anemia. Hgb 11.6 , stable.   # Hx Etoh abuse, in remission, none in 3 years   GI Attending  Patient standing at bedside when I evaluated him. Abd exam w/ epigastric tenderness. Is having flatus and moved his bowels yesterday. Continue supportive care - consider trial of clear liquid diet tomorrow.  Gatha Mayer, MD, [facility] See Shea Evans on call - gastroenterology for best contact person 08/16/2022 1:58 PM   Subjective  Still has abdominal discomfort despite PCA.  NPO  Objective   Endoscopic studies:   January 2023 EUS EGD Impression: - White nummular lesions in esophageal mucosa. Biopsied. - LA Grade D erosive esophagitis with no bleeding distally. - Z-line irregular, 45 cm from the incisors. - 1 cm hiatal hernia. - Angulation deformities in the entire stomach. - Erythematous mucosa in the stomach. Biopsied. - Duodenal extrinsic deformity (seems to be the gallbladder based on the EUS more than anything else). - Normal mucosa was found in the duodenal bulb, in the first portion of the duodenum and in the second portion of the duodenum. - Normal major papilla. EUS Impression: - A perisplenic cystic lesion was noted. - Pancreatic parenchymal abnormalities consisting of diffuse echogenicity, lobularity with honeycombing, hyperechoic foci without shadowing and hyperechoic strands were noted in the entire pancreas. This patient has chronic pancreatitis. The parenchyma is concerning for significant necrosis, but not clear that I have an ability to safely perform a cystgastrostomy or enterostomy. - As noted above, the patient now has had significant changes in the way the previous CT looks compared to now, so we did not perform any other interventions. He will need more imaging. - There was no sign of significant pathology in the common bile duct and in the common hepatic duct. - Hyperechoic material consistent with sludge was visualized endosonographically in the gallbladder. This was in a region consistent with the gallbladder though small chance this could have been a peripancreatic fluid collection in that area of the RUQ as well. - A few enlarged lymph nodes were visualized in the celiac region (level 20). Tissue has not been obtained. However, the endosonographic appearance is consistent with benign inflammatory changes. - Endosonographically, there was  normal vascular flow in the splenic vein. - Endosonographic images of the left kidney were unremarkable.   Laboratory Studies  Reviewed  those in epic   Imaging Studies  December 2022 CT abdomen pelvis with contrast IMPRESSION: 1. Signs of chronic pancreatitis with interval resolution of previous peripancreatic edema and free fluid. 2. 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 These have increased in size from previous exam. 3. Pseudocysts exhibit mass effect upon the splenic vein, which is significantly narrowed resulting in interval development of upper abdominal collaterals. 4. Small amount of free fluid noted within the dependent portion of the pelvis.  March 2023 CT abdomen pelvis with contrast IMPRESSION: 1. Findings most consistent with acute interstitial edematous pancreatitis superimposed on chronic pancreatitis. Peripancreatic fluid collections are most consistent with pseudocysts. 2. Nonocclusive thrombus in the portal vein is likely secondary to pancreatitis.  CT Abdomen Pelvis W Contrast  Result Date: 08/13/2022 CLINICAL DATA:  Postoperative abdominal pain. History of pancreatic stent placed for pancreatitis. EXAM: CT ABDOMEN AND PELVIS WITH CONTRAST TECHNIQUE: Multidetector CT imaging of the abdomen and pelvis was performed using the standard protocol following bolus administration of intravenous contrast. RADIATION DOSE REDUCTION: This exam was performed according to the departmental dose-optimization program which includes automated exposure control, adjustment of the mA and/or kV according to patient size and/or use of iterative reconstruction technique. CONTRAST:  OMNIPAQUE IOHEXOL 300 MG/ML  SOLN COMPARISON:  07/19/2022 FINDINGS: Lower chest: Lung bases are clear. Hepatobiliary: No focal liver abnormality is seen. Chronic portal vein thrombosis with cavernous transformation. No change. Findings are less well demonstrated than on the previous study due to the stage of contrast bolus. No gallstones, gallbladder wall thickening, or biliary dilatation. Pancreas: A  pancreatic duct stent is in place. Heterogeneous enlarged appearance of the pancreas with low-attenuation changes suggested in the head and body of the pancreas, likely representing pancreatic necrosis. Mild stranding around the pancreas. Scattered calcifications suggest acute on chronic pancreatitis. Follow-up after resolution of acute process is recommended to exclude underlying pancreatic mass. Spleen: Normal in size without focal abnormality. Adrenals/Urinary Tract: Adrenal glands are unremarkable. Kidneys are normal, without renal calculi, focal lesion, or hydronephrosis. Bladder is unremarkable. Stomach/Bowel: Stomach is within normal limits. Appendix appears normal. No evidence of bowel wall thickening, distention, or inflammatory changes. Vascular/Lymphatic: No significant vascular findings are present. No enlarged abdominal or pelvic lymph nodes. Reproductive: Prostate is unremarkable. Other: No abdominal wall hernia or abnormality. No abdominopelvic ascites. Musculoskeletal: No acute or significant osseous findings. IMPRESSION: 1. Changes of acute pancreatitis with enlarged edematous pancreatic parenchyma. Low-attenuation changes suggest likely pancreatic necrosis. No loculated collections. A pancreatic duct stent is in place. Calcifications in the pancreas suggest evidence of chronic pancreatitis. 2. Chronic portal venous thrombosis without apparent change since prior study. Electronically Signed   By: Burman Nieves M.D.   On: 08/13/2022 23:21   DG ERCP  Result Date: 08/12/2022 CLINICAL DATA:  History of pancreatitis.  ERCP EXAM: ERCP TECHNIQUE: Multiple spot images obtained with the fluoroscopic device and submitted for interpretation post-procedure. FLUOROSCOPY TIME: FLUOROSCOPY TIME 3 minutes, 8 seconds (18.7 mGy) COMPARISON:  CT abdomen and pelvis-07/19/2022; MRCP-06/02/2022 FINDINGS: Fourteen spot intraoperative fluoroscopic images of the right upper abdominal quadrant during ERCP are provided  for review Initial image demonstrates an ERCP probe overlying the right upper abdominal quadrant. Subsequent images demonstrate selective cannulation of the pancreatic duct, initially with cannulation of a pancreatic head side branch, ultimately with cannulation of the main pancreatic  duct. Contrast injection suggests mild dilatation at the mid body of the pancreas Subsequent images demonstrate placement of an internal pancreatic stent. IMPRESSION: ERCP with pancreatic stent placement as above. These images were submitted for radiologic interpretation only. Please see the procedural report for the amount of contrast and the fluoroscopy time utilized. Electronically Signed   By: Simonne Come M.D.   On: 08/12/2022 12:00    Lab Results: Recent Labs    08/13/22 1934 08/15/22 0328 08/16/22 0316  WBC 6.3 7.2 4.1  HGB 14.1 12.6* 11.6*  HCT 42.2 38.2* 35.5*  PLT 187 140* 140*   BMET Recent Labs    08/14/22 0625 08/15/22 0328 08/16/22 0316  NA 132* 135 136  K 3.5 3.5 3.7  CL 102 102 102  CO2 22 24 27   GLUCOSE 154* 188* 252*  BUN 8 8 6   CREATININE 0.64 0.70 0.82  CALCIUM 8.1* 8.4* 8.5*   LFT Recent Labs    08/16/22 0316  PROT 6.3*  ALBUMIN 3.2*  AST 14*  ALT 11  ALKPHOS 66  BILITOT 0.6   PT/INR No results for input(s): "LABPROT", "INR" in the last 72 hours.  Lab Results  Component Value Date   LIPASE 40 08/13/2022    Scheduled inpatient medications:   apixaban  5 mg Oral BID   HYDROmorphone   Intravenous Q4H   insulin aspart  0-15 Units Subcutaneous TID PC & HS   lipase/protease/amylase  36,000 Units Oral TID WC   pantoprazole  80 mg Oral Daily   Continuous inpatient infusions:   sodium chloride 150 mL/hr at 08/16/22 0620   PRN inpatient medications: acetaminophen, diphenhydrAMINE **OR** diphenhydrAMINE, hydrALAZINE, naloxone **AND** sodium chloride flush, ondansetron (ZOFRAN) IV  Vital signs in last 24 hours: Temp:  [97.9 F (36.6 C)-98.9 F (37.2 C)] 98.1 F (36.7  C) (10/09 0854) Pulse Rate:  [68-88] 78 (10/09 0854) Resp:  [12-18] 16 (10/09 0854) BP: (130-151)/(85-102) 137/85 (10/09 0854) SpO2:  [95 %-100 %] 98 % (10/09 0854) FiO2 (%):  [0 %] 0 % (10/08 1506) Last BM Date : 08/15/22  Intake/Output Summary (Last 24 hours) at 08/16/2022 0907 Last data filed at 08/16/2022 0620 Gross per 24 hour  Intake 3567.48 ml  Output --  Net 3567.48 ml    Intake/Output from previous day: 10/08 0701 - 10/09 0700 In: 5448 [P.O.:180; I.V.:5268] Out: -  Intake/Output this shift: No intake/output data recorded.   Physical Exam:  General: Alert male in NAD Heart:  Regular rate and rhythm. No lower extremity edema Pulmonary: Normal respiratory effort Abdomen: Soft, nondistended, mild epigastric tenderness,  A few bowel sounds.  Neurologic: Alert and oriented Psych: Pleasant. Cooperative.    Principal Problem:   Acute on chronic pancreatitis [facility]) Active Problems:   Uncontrolled type 1 diabetes mellitus with hyperglycemia, with long-term current use of insulin (HCC)   Portal vein thrombosis   History of alcohol abuse     LOS: 2 days   12/09 ,NP 08/16/2022, 9:07 AM

## 2022-08-17 DIAGNOSIS — I81 Portal vein thrombosis: Secondary | ICD-10-CM | POA: Diagnosis not present

## 2022-08-17 DIAGNOSIS — E1065 Type 1 diabetes mellitus with hyperglycemia: Secondary | ICD-10-CM | POA: Diagnosis not present

## 2022-08-17 DIAGNOSIS — K859 Acute pancreatitis without necrosis or infection, unspecified: Secondary | ICD-10-CM | POA: Diagnosis not present

## 2022-08-17 DIAGNOSIS — F1011 Alcohol abuse, in remission: Secondary | ICD-10-CM | POA: Diagnosis not present

## 2022-08-17 LAB — GLUCOSE, CAPILLARY
Glucose-Capillary: 159 mg/dL — ABNORMAL HIGH (ref 70–99)
Glucose-Capillary: 138 mg/dL — ABNORMAL HIGH (ref 70–99)
Glucose-Capillary: 238 mg/dL — ABNORMAL HIGH (ref 70–99)
Glucose-Capillary: 222 mg/dL — ABNORMAL HIGH (ref 70–99)

## 2022-08-17 NOTE — Plan of Care (Signed)
  Problem: Pain Managment: Goal: General experience of comfort will improve Outcome: Progressing   Problem: Activity: Goal: Risk for activity intolerance will decrease Outcome: Progressing   Problem: Nutrition: Goal: Adequate nutrition will be maintained Outcome: Progressing   

## 2022-08-17 NOTE — Progress Notes (Signed)
  Transition of Care [facility]) Screening Note   Patient Details  Name: Carlos [name] Date of Birth: [birth year]   Transition of Care [facility]) CM/SW Contact:    Lennart Pall, LCSW Phone Number: 08/17/2022, 11:26 AM    Transition of Care Department [facility]) has reviewed patient and no TOC needs have been identified at this time. We will continue to monitor patient advancement through interdisciplinary progression rounds. If new patient transition needs arise, please place a TOC consult.

## 2022-08-17 NOTE — Progress Notes (Addendum)
Daily Progress Note  Hospital Day: 5  Chief Complaint: post-ercp pancreatitis  Brief History 36 yo male known to Dr. 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 Seen in consultation by GI on 10/7. Patient was previously followed by Dr. 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 Cause of pancreatitis wasn't clear. He was referred to [facility] for ERCP with manometry. [facility] saw him 7/31, felt there was a PD stricture based on MRI / MRCP results. He underwent ERCP with stent placement in ventral duct of pancreas for a stricture. Developed abdominal pain, N/V and subsequently admitted here with acute pancreatitis . CT scan with some changes of acute pancreatitis with enlarged edematous pancreatic parenchyma. Low-attenuation changes suggest likely pancreatic necrosis. No loculated collections. A pancreatic duct stent is in place. Calcifications in the pancreas suggest evidence of chronic pancreatitis.Seen in consultation by GI on 10/7   Assessment / Plan  # 36 yo male with post-ERCP pancreatitis superimposed on  chronic pancreatitis. Underwent ERCP with stent placement in ventral duct of pancreas for PD stricture  on 10/5.     Afebrile. Labs reviewed: None today.  Feels better today, less abdominal pain.  Will try clears today Resume creon when tolerating solids    # Mild macrocytic anemia. Hgb 11.6 , stable as of yesterday    # Hx Etoh abuse, in remission, none in 3 years    Subjective   Abdominal pain improving. Wants trial of clears.   Objective  ng:  CT Abdomen Pelvis W Contrast  Result Date: 08/13/2022 CLINICAL DATA:  Postoperative abdominal pain. History of pancreatic stent placed for pancreatitis. EXAM: CT ABDOMEN AND PELVIS WITH CONTRAST TECHNIQUE: Multidetector CT imaging of the abdomen and pelvis was performed using the standard  protocol following bolus administration of intravenous contrast. RADIATION DOSE REDUCTION: This exam was performed according to the departmental dose-optimization program which includes automated exposure control, adjustment of the mA and/or kV according to patient size and/or use of iterative reconstruction technique. CONTRAST:  134mL OMNIPAQUE IOHEXOL 300 MG/ML  SOLN COMPARISON:  07/19/2022 FINDINGS: Lower chest: Lung bases are clear. Hepatobiliary: No focal liver abnormality is seen. Chronic portal vein thrombosis with cavernous transformation. No change. Findings are less well demonstrated than on the previous study due to the stage of contrast bolus. No gallstones, gallbladder wall thickening, or biliary dilatation. Pancreas: A pancreatic duct stent is in place. Heterogeneous enlarged appearance of the pancreas with low-attenuation changes suggested in the head and body of the pancreas, likely representing pancreatic necrosis. Mild stranding around the pancreas. Scattered calcifications suggest acute on chronic pancreatitis. Follow-up after resolution of acute process is recommended to exclude underlying pancreatic mass. Spleen: Normal in size without focal abnormality. Adrenals/Urinary Tract: Adrenal glands are unremarkable. Kidneys are normal, without renal calculi, focal lesion, or hydronephrosis. Bladder is unremarkable. Stomach/Bowel: Stomach is within normal limits. Appendix appears normal. No evidence of bowel wall thickening, distention, or inflammatory changes. Vascular/Lymphatic: No significant vascular findings are present. No enlarged abdominal or pelvic lymph nodes. Reproductive: Prostate is unremarkable. Other: No abdominal wall hernia or abnormality. No abdominopelvic ascites. Musculoskeletal: No acute or significant osseous findings. IMPRESSION: 1. Changes of acute pancreatitis with enlarged edematous pancreatic parenchyma. Low-attenuation changes suggest likely pancreatic necrosis. No loculated  collections. A pancreatic duct stent is in place. Calcifications in the pancreas suggest evidence of chronic pancreatitis. 2. Chronic portal venous thrombosis without apparent  change since prior study. Electronically Signed   By: Lucienne Capers M.D.   On: 08/13/2022 23:21    Lab Results: Recent Labs    08/15/22 0328 08/16/22 0316  WBC 7.2 4.1  HGB 12.6* 11.6*  HCT 38.2* 35.5*  PLT 140* 140*   BMET Recent Labs    08/15/22 0328 08/16/22 0316  NA 135 136  K 3.5 3.7  CL 102 102  CO2 24 27  GLUCOSE 188* 252*  BUN 8 6  CREATININE 0.70 0.82  CALCIUM 8.4* 8.5*   LFT Recent Labs    08/16/22 0316  PROT 6.3*  ALBUMIN 3.2*  AST 14*  ALT 11  ALKPHOS 66  BILITOT 0.6   PT/INR No results for input(s): "LABPROT", "INR" in the last 72 hours.   Scheduled inpatient medications:   apixaban  5 mg Oral BID   HYDROmorphone   Intravenous Q4H   insulin aspart  0-15 Units Subcutaneous TID PC & HS   lipase/protease/amylase  36,000 Units Oral TID WC   pantoprazole  80 mg Oral Daily   Continuous inpatient infusions:   sodium chloride 150 mL/hr at 08/17/22 0302   PRN inpatient medications: acetaminophen, diphenhydrAMINE **OR** diphenhydrAMINE, hydrALAZINE, naloxone **AND** sodium chloride flush, ondansetron (ZOFRAN) IV  Vital signs in last 24 hours: Temp:  [98 F (36.7 C)-98.5 F (36.9 C)] 98 F (36.7 C) (10/10 0558) Pulse Rate:  [70-77] 77 (10/10 0558) Resp:  [14-17] 17 (10/10 0558) BP: (125-138)/(84-100) 126/89 (10/10 0558) SpO2:  [97 %-100 %] 100 % (10/10 0558) FiO2 (%):  [0 %] 0 % (10/10 0400) Last BM Date : 08/14/22  Intake/Output Summary (Last 24 hours) at 08/17/2022 0951 Last data filed at 08/17/2022 0600 Gross per 24 hour  Intake 3429.18 ml  Output --  Net 3429.18 ml    Intake/Output from previous day: 10/09 0701 - 10/10 0700 In: 3429.2 [P.O.:60; I.V.:3369.2] Out: -  Intake/Output this shift: No intake/output data recorded.   Physical Exam:  General: Alert  male in NAD Heart:  Regular rate and rhythm. No lower extremity edema Pulmonary: Normal respiratory effort Abdomen: Soft, nondistended, moderate epigastric tenderness. A few bowel sounds.  Neurologic: Alert and oriented Psych: Pleasant. Cooperative.    Principal Problem:   Acute on chronic pancreatitis ([place]) Active Problems:   Uncontrolled type 1 diabetes mellitus with hyperglycemia, with long-term current use of insulin (HCC)   Portal vein thrombosis   History of alcohol abuse   Intractable pain   Post-ERCP acute pancreatitis     LOS: 3 days   Tye Savoy ,NP 08/17/2022, 9:51 AM

## 2022-08-17 NOTE — Plan of Care (Signed)
  Problem: Coping: Goal: Ability to adjust to condition or change in health will improve Outcome: Progressing   

## 2022-08-17 NOTE — Progress Notes (Signed)
PROGRESS NOTE    Carlos [name]  KVQ:[id number] DOB: [birth year] DOA: 08/13/2022 PCP: [name], MD    Brief Narrative:  Carlos [name] is a 36 y.o. male with medical history significant of portal vein thrombus on Eliquis, chronic pancreatitis, type 1 diabetes, history of alcohol abuse, pancreatic duct stricture s/p stent presented to the hospital with acute abdominal pain.  Of note, patient had ERCP to evaluate pancreatic duct strictures by  GI Dr. [name] on 08/12/2022 and underwent pancreatic sphincterotomy and placement of pancreatic stent.  Postoperatively, he continued complaining of abdominal pain and was sent home with a short course of opioids.  Had minimal relief with pain medication and decided to come to ER due to ongoing pain.  In the ED, patient had elevated blood pressures with BP of 160/114.  No leukocytosis or anemia.   Lipase of 40.  CT scan of the abdomen pelvis showed acute on chronic pancreatitis with possible pancreatic necrosis.  Patient did have chronic portal vein thrombosis without any apparent change.  GI was notified from the ED and was admitted hospital for further evaluation and treatment.  During hospitalization, patient had intractable abdominal pain.  Was on Dilaudid IV which was switched to Dilaudid pump.  Slightly better pain relief on 08/17/2022.  Still remains NPO.     Assessment and Plan: Principal Problem:   Acute on chronic pancreatitis (HCC) Active Problems:   Uncontrolled type 1 diabetes mellitus with hyperglycemia, with long-term current use of insulin (HCC)   Portal vein thrombosis   History of alcohol abuse   Intractable pain   Post-ERCP acute pancreatitis   Status post ERCP with acute on chronic pancreatitis with intractable pain. Status post ERCP and  pancreatic sphincterotomy and placement of pancreatic stent with [facility] Dr. [name] on 08/12/2022.Marland Kitchen  Repeat CT scan of the abdomen pelvis slowing worsened pancreatitis and possible necrosis.   Patient stated that he feels better today on Dilaudid pump after adding basal dose Dilaudid 0.25 yesterday.  Still NPO.    Uncontrolled type 1 diabetes mellitus with hyperglycemia, with long-term current use of insulin (HCC) Latest hemoglobin A1c of 7.0 on 07/20/2022.  Continue moderate scale sliding scale insulin.  Accu-Cheks diabetic diet.  Latest POC glucose of 159.  Patient is on insulin pump at home.  Portal vein thrombosis CT scan of the abdomen and pelvis with unchanged chronic portal vein thrombosis at this time.  Continue Eliquis.  History of alcohol abuse Has not taken alcohol recently.   DVT prophylaxis:  apixaban (ELIQUIS) tablet 5 mg   Code Status:     Code Status: Full Code  Disposition: Home  Status is: Inpatient  The patient is inpatient because: Intractable abdominal pain,  IV Dilaudid  pump   Family Communication:  None at bedside  Consultants:  GI  Procedures:  None  Antimicrobials:  None  Anti-infectives (From admission, onward)    None       Subjective: Today, patient was seen and examined at bedside.  Denies any nausea vomiting.  His abdominal pain has slightly improved compared to yesterday.  Has had a bowel movement.  No fever or chills.   Vitals:   08/17/22 0000 08/17/22 0138 08/17/22 0400 08/17/22 0558  BP:  125/86  126/89  Pulse:  72  77  Resp: 14 17 14 17   Temp:  98 F (36.7 C)  98 F (36.7 C)  TempSrc:  Oral  Oral  SpO2: 98% 99% 97% 100%  Weight:  Height:        Intake/Output Summary (Last 24 hours) at 08/17/2022 0945 Last data filed at 08/17/2022 0600 Gross per 24 hour  Intake 3429.18 ml  Output --  Net 3429.18 ml    Filed Weights   08/13/22 1915  Weight: 77.1 kg    Physical Examination: Body mass index is 20.16 kg/m.   General:  Average built, not in obvious distress HENT:   No scleral pallor or icterus noted. Oral mucosa is moist.  Chest:  Clear breath sounds.  Diminished breath sounds bilaterally. No  crackles or wheezes.  CVS: S1 &S2 heard. No murmur.  Regular rate and rhythm. Abdomen: Nondistended but voluntary guarding noted, mild tenderness on palpation.  Bowel sounds are heard.   Extremities: No cyanosis, clubbing or edema.  Peripheral pulses are palpable. Psych: Alert, awake and oriented, normal mood CNS:  No cranial nerve deficits.  Power equal in all extremities.   Skin: Warm and dry.  No rashes noted.  Data Reviewed:   CBC: Recent Labs  Lab 08/13/22 1934 08/15/22 0328 08/16/22 0316  WBC 6.3 7.2 4.1  NEUTROABS 4.6  --   --   HGB 14.1 12.6* 11.6*  HCT 42.2 38.2* 35.5*  MCV 98.6 100.3* 102.0*  PLT 187 140* 140*     Basic Metabolic Panel: Recent Labs  Lab 08/13/22 1934 08/14/22 0625 08/15/22 0328 08/16/22 0316  NA 135 132* 135 136  K 3.9 3.5 3.5 3.7  CL 101 102 102 102  CO2 23 22 24 27   GLUCOSE 180* 154* 188* 252*  BUN 10 8 8 6   CREATININE 0.85 0.64 0.70 0.82  CALCIUM 9.1 8.1* 8.4* 8.5*  MG  --   --  1.7 1.7     Liver Function Tests: Recent Labs  Lab 08/13/22 1934 08/14/22 0625 08/15/22 0328 08/16/22 0316  AST 23 15 12* 14*  ALT 14 13 12 11   ALKPHOS 74 61 58 66  BILITOT 1.0 1.1 0.8 0.6  PROT 7.7 6.5 6.5 6.3*  ALBUMIN 4.2 3.3* 3.3* 3.2*      Radiology Studies: No results found.    LOS: 3 days    Flora Lipps, MD [facility] 08/17/2022, 9:45 AM

## 2022-08-18 DIAGNOSIS — E1065 Type 1 diabetes mellitus with hyperglycemia: Secondary | ICD-10-CM | POA: Diagnosis not present

## 2022-08-18 DIAGNOSIS — K859 Acute pancreatitis without necrosis or infection, unspecified: Secondary | ICD-10-CM | POA: Diagnosis not present

## 2022-08-18 DIAGNOSIS — I81 Portal vein thrombosis: Secondary | ICD-10-CM | POA: Diagnosis not present

## 2022-08-18 DIAGNOSIS — F1011 Alcohol abuse, in remission: Secondary | ICD-10-CM | POA: Diagnosis not present

## 2022-08-18 LAB — CBC
HCT: 37.3 % — ABNORMAL LOW (ref 39.0–52.0)
Hemoglobin: 12.2 g/dL — ABNORMAL LOW (ref 13.0–17.0)
MCH: 33.2 pg (ref 26.0–34.0)
MCHC: 32.7 g/dL (ref 30.0–36.0)
MCV: 101.4 fL — ABNORMAL HIGH (ref 80.0–100.0)
Platelets: 174 10*3/uL (ref 150–400)
RBC: 3.68 MIL/uL — ABNORMAL LOW (ref 4.22–5.81)
RDW: 12.3 % (ref 11.5–15.5)
WBC: 3.9 10*3/uL — ABNORMAL LOW (ref 4.0–10.5)
nRBC: 0 % (ref 0.0–0.2)

## 2022-08-18 LAB — COMPREHENSIVE METABOLIC PANEL
ALT: 12 U/L (ref 0–44)
AST: 12 U/L — ABNORMAL LOW (ref 15–41)
Albumin: 3.5 g/dL (ref 3.5–5.0)
Alkaline Phosphatase: 72 U/L (ref 38–126)
Anion gap: 6 (ref 5–15)
BUN: 8 mg/dL (ref 6–20)
CO2: 28 mmol/L (ref 22–32)
Calcium: 8.9 mg/dL (ref 8.9–10.3)
Chloride: 102 mmol/L (ref 98–111)
Creatinine, Ser: 0.8 mg/dL (ref 0.61–1.24)
GFR, Estimated: >60 mL/min (ref >60)
Glucose, Bld: 204 mg/dL — ABNORMAL HIGH (ref 70–99)
Potassium: 4.1 mmol/L (ref 3.5–5.1)
Sodium: 136 mmol/L (ref 135–145)
Total Bilirubin: 0.4 mg/dL (ref 0.3–1.2)
Total Protein: 6.8 g/dL (ref 6.5–8.1)

## 2022-08-18 LAB — FOLATE: Folate: 17.6 ng/mL (ref >5.9)

## 2022-08-18 LAB — GLUCOSE, CAPILLARY
Glucose-Capillary: 210 mg/dL — ABNORMAL HIGH (ref 70–99)
Glucose-Capillary: 133 mg/dL — ABNORMAL HIGH (ref 70–99)
Glucose-Capillary: 120 mg/dL — ABNORMAL HIGH (ref 70–99)
Glucose-Capillary: 398 mg/dL — ABNORMAL HIGH (ref 70–99)

## 2022-08-18 LAB — MAGNESIUM: Magnesium: 1.7 mg/dL (ref 1.7–2.4)

## 2022-08-18 LAB — VITAMIN B12: Vitamin B-12: 390 pg/mL (ref 180–914)

## 2022-08-18 LAB — LIPASE, BLOOD: Lipase: 42 U/L (ref 11–51)

## 2022-08-18 MED ORDER — INSULIN GLARGINE-YFGN 100 UNIT/ML ~~LOC~~ SOLN
10.0000 [IU] | Freq: Every day | SUBCUTANEOUS | Status: DC
  Administered 2022-08-18: 10 [IU] via SUBCUTANEOUS
  Filled 2022-08-18 (×2): qty 0.1

## 2022-08-18 MED ORDER — HYDROMORPHONE HCL 2 MG/ML IJ SOLN
2.0000 mg | INTRAMUSCULAR | Status: DC | PRN
  Administered 2022-08-18 (×2): 2 mg via INTRAVENOUS
  Filled 2022-08-18 (×2): qty 1

## 2022-08-18 MED ORDER — OXYCODONE HCL 5 MG PO TABS
10.0000 mg | ORAL_TABLET | ORAL | Status: DC | PRN
  Administered 2022-08-18 – 2022-08-19 (×3): 10 mg via ORAL
  Filled 2022-08-18 (×3): qty 2

## 2022-08-18 NOTE — Progress Notes (Signed)
PROGRESS NOTE    Carlos [name]  ONG:[id number] DOB: 09/19/1986 DOA: 08/13/2022 PCP: Frederica KusterMiller, Stephen M, MD    Brief Narrative:  Carlos [name] is a 36 y.o. male with medical history significant of portal vein thrombus on Eliquis, chronic pancreatitis, type 1 diabetes, history of alcohol abuse, pancreatic duct stricture s/p stent presented to the hospital with acute abdominal pain.  Of note, patient had ERCP to evaluate pancreatic duct strictures by  GI Dr. Meridee ScoreMansouraty on 08/12/2022 and underwent pancreatic sphincterotomy and placement of pancreatic stent.  Postoperatively, he continued complaining of abdominal pain and was sent home with a short course of opioids.  Had minimal relief with pain medication and decided to come to ER due to ongoing pain.  In the ED, patient had elevated blood pressures with BP of 160/114.  No leukocytosis or anemia.   Lipase of 40.  CT scan of the abdomen pelvis showed acute on chronic pancreatitis with possible pancreatic necrosis.  Patient did have chronic portal vein thrombosis without any apparent change.  GI was notified from the ED and was admitted hospital for further evaluation and treatment.  During hospitalization, patient had intractable abdominal pain.  Was on Dilaudid IV which was switched to Dilaudid pump.  Pain better on Dilaudid pump.  Plan to switch to IV Dilaudid and add oral oxycodone.    Assessment and Plan: Principal Problem:   Acute on chronic pancreatitis (HCC) Active Problems:   Uncontrolled type 1 diabetes mellitus with hyperglycemia, with long-term current use of insulin (HCC)   Portal vein thrombosis   History of alcohol abuse   Intractable pain   Post-ERCP acute pancreatitis   Status post ERCP with acute on chronic pancreatitis with intractable pain. Status post ERCP and  pancreatic sphincterotomy and placement of pancreatic stent with [facility] Dr. Meridee ScoreMansouraty on 08/12/2022.Marland Kitchen.  Repeat CT scan of the abdomen pelvis slowing worsened pancreatitis and  possible necrosis.  Pain control better with Dilaudid pump.  Has been advanced to full liquids.  We will transition to IV Dilaudid and oral oxycodone.  Dilaudid pump after adding basal   Uncontrolled type 1 diabetes mellitus with hyperglycemia, with long-term current use of insulin (HCC) Latest hemoglobin A1c of 7.0 on 07/20/2022.  Continue moderate scale sliding scale insulin.  Accu-Cheks diabetic diet.  Latest POC glucose of 159.  Patient is on insulin pump at home.  We will add the long-acting insulin at 10 units from today.  Portal vein thrombosis CT scan of the abdomen and pelvis with unchanged chronic portal vein thrombosis at this time.  Continue Eliquis.  History of alcohol abuse Has not taken alcohol recently.   DVT prophylaxis:  apixaban (ELIQUIS) tablet 5 mg   Code Status:     Code Status: Full Code  Disposition: Home  Status is: Inpatient  The patient is inpatient because: Intractable abdominal pain,  IV Dilaudid   Family Communication:  None at bedside  Consultants:  GI  Procedures:  None  Antimicrobials:  None  Anti-infectives (From admission, onward)    None      Subjective: Today, patient was seen and examined at bedside.  Denies any nausea vomiting.  Pain control stable on pump.  Discussed about transitioning to oral narcotics plus IV fluids for long-term better control.  Patient does have baseline chronic abdominal pain.  Had bowel movement yesterday.  Was able to tolerate clears.  Vitals:   08/18/22 0422 08/18/22 0929 08/18/22 0940 08/18/22 1230  BP: (!) 141/95  (!) 126/94  Pulse: 68  64   Resp: 12 16 18 16   Temp: 98 F (36.7 C)  98.1 F (36.7 C)   TempSrc: Oral  Oral   SpO2: 100% 100% 100% 100%  Weight:      Height:        Intake/Output Summary (Last 24 hours) at 08/18/2022 1244 Last data filed at 08/18/2022 1038 Gross per 24 hour  Intake 4608.6 ml  Output --  Net 4608.6 ml    Filed Weights   08/13/22 1915  Weight: 77.1 kg     Physical Examination: Body mass index is 20.16 kg/m.   General:  Average built, not in obvious distress HENT:   No scleral pallor or icterus noted. Oral mucosa is moist.  Chest:  Clear breath sounds.  Diminished breath sounds bilaterally. No crackles or wheezes.  CVS: S1 &S2 heard. No murmur.  Regular rate and rhythm. Abdomen: Soft, mild epigastric tenderness noted.,  Guarding present.  Nondistended.  Bowel sounds are heard.   Extremities: No cyanosis, clubbing or edema.  Peripheral pulses are palpable. Psych: Alert, awake and oriented, normal mood CNS:  No cranial nerve deficits.  Moves all extremities. Skin: Warm and dry.  No rashes noted.   Data Reviewed:   CBC: Recent Labs  Lab 08/13/22 1934 08/15/22 0328 08/16/22 0316 08/18/22 0318  WBC 6.3 7.2 4.1 3.9*  NEUTROABS 4.6  --   --   --   HGB 14.1 12.6* 11.6* 12.2*  HCT 42.2 38.2* 35.5* 37.3*  MCV 98.6 100.3* 102.0* 101.4*  PLT 187 140* 140* 174     Basic Metabolic Panel: Recent Labs  Lab 08/13/22 1934 08/14/22 0625 08/15/22 0328 08/16/22 0316 08/18/22 0318  NA 135 132* 135 136 136  K 3.9 3.5 3.5 3.7 4.1  CL 101 102 102 102 102  CO2 23 22 24 27 28   GLUCOSE 180* 154* 188* 252* 204*  BUN 10 8 8 6 8   CREATININE 0.85 0.64 0.70 0.82 0.80  CALCIUM 9.1 8.1* 8.4* 8.5* 8.9  MG  --   --  1.7 1.7 1.7     Liver Function Tests: Recent Labs  Lab 08/13/22 1934 08/14/22 0625 08/15/22 0328 08/16/22 0316 08/18/22 0318  AST 23 15 12* 14* 12*  ALT 14 13 12 11 12   ALKPHOS 74 61 58 66 72  BILITOT 1.0 1.1 0.8 0.6 0.4  PROT 7.7 6.5 6.5 6.3* 6.8  ALBUMIN 4.2 3.3* 3.3* 3.2* 3.5      Radiology Studies: No results found.    LOS: 4 days    Flora Lipps, MD [facility] 08/18/2022, 12:44 PM

## 2022-08-18 NOTE — Progress Notes (Signed)
Daily Progress Note  Hospital Day: 6  Chief Complaint: post ERCP pancreatitis  Brief History 36 yo male known to Dr. 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 Seen in consultation by GI on 10/7. Patient was previously followed by Dr. 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 Cause of pancreatitis wasn't clear. He was referred to [facility] for ERCP with manometry. [facility] saw him 7/31, felt there was a PD stricture based on MRI / MRCP results. ON 10/5 he underwent ERCP ( [name]) with pancreatic sphincterotomy and with stent placement in ventral duct of pancreas for a stricture. Subsequent developed abdominal pain, N/V . Admitted with post -ERCP pancreatitis . CT scan with some changes of acute pancreatitis with enlarged edematous pancreatic parenchyma. Low-attenuation changes suggest likely pancreatic necrosis. No loculated collections. A pancreatic duct stent is in place. Calcifications in the pancreas suggest evidence of chronic pancreatitis. Seen in consultation by [place] [facility] 10/7  Assessment / Plan   # 36 yo male with post-ERCP pancreatitis superimposed on chronic pancreatitis. Underwent ERCP with pancreatic sphincterotomy and stent placement to ventral duct of pancreas for PD stricture on 10/5.     Afebrile. Labs reviewed:HCT 37%, nl renal function, lipase normal. WBC 3.9. Hgb 12.2.   Pain "tolerable". Tolerating clears .  Trial of full liquids today.  Continue creon    # Mild macrocytic anemia. Hgb stable at 12.2  B12, folate  # Hx Etoh abuse, in remission, none in 3 years    Subjective  Feels about the same as yesterday which is overall better. Pain is "tolerable" Small BM yesterday afternoon Tolerating clear liq  Objective   Imaging:  No results found.  Lab Results: Recent Labs    08/16/22 0316 08/18/22 0318  WBC 4.1 3.9*   HGB 11.6* 12.2*  HCT 35.5* 37.3*  PLT 140* 174   BMET Recent Labs    08/16/22 0316 08/18/22 0318  NA 136 136  K 3.7 4.1  CL 102 102  CO2 27 28  GLUCOSE 252* 204*  BUN 6 8  CREATININE 0.82 0.80  CALCIUM 8.5* 8.9   LFT Recent Labs    08/18/22 0318  PROT 6.8  ALBUMIN 3.5  AST 12*  ALT 12  ALKPHOS 72  BILITOT 0.4   PT/INR No results for input(s): "LABPROT", "INR" in the last 72 hours.   Scheduled inpatient medications:   apixaban  5 mg Oral BID   HYDROmorphone   Intravenous Q4H   insulin aspart  0-15 Units Subcutaneous TID PC & HS   lipase/protease/amylase  36,000 Units Oral TID WC   pantoprazole  80 mg Oral Daily   Continuous inpatient infusions:   sodium chloride 150 mL/hr at 08/18/22 0652   PRN inpatient medications: acetaminophen, diphenhydrAMINE **OR** diphenhydrAMINE, hydrALAZINE, naloxone **AND** sodium chloride flush, ondansetron (ZOFRAN) IV  Vital signs in last 24 hours: Temp:  [98 F (36.7 C)-98.3 F (36.8 C)] 98 F (36.7 C) (10/11 0422) Pulse Rate:  [66-73] 68 (10/11 0422) Resp:  [12-17] 12 (10/11 0422) BP: (127-147)/(88-100) 141/95 (10/11 0422) SpO2:  [98 %-100 %] 100 % (10/11 0422) FiO2 (%):  [0 %] 0 % (10/10 2012) Last BM Date : 08/14/22  Intake/Output Summary (Last 24 hours) at 08/18/2022 0906 Last data filed at 08/18/2022 9735 Gross per 24 hour  Intake 3803.8 ml  Output --  Net 3803.8 ml    Intake/Output  from previous day: 10/10 0701 - 10/11 0700 In: 3803.8 [P.O.:180; I.V.:3623.8] Out: -  Intake/Output this shift: No intake/output data recorded.   Physical Exam:  General: Alert male in NAD Heart:  Regular rate and rhythm. No lower extremity edema Pulmonary: Normal respiratory effort Abdomen: Soft, nondistended, mild epigastric tenderness. Normal bowel sounds.  Neurologic: Alert and oriented Psych: Pleasant. Cooperative.    Principal Problem:   Acute on chronic pancreatitis ([place]) Active Problems:   Uncontrolled type 1  diabetes mellitus with hyperglycemia, with long-term current use of insulin (HCC)   Portal vein thrombosis   History of alcohol abuse   Intractable pain   Post-ERCP acute pancreatitis     LOS: 4 days   Tye Savoy ,NP 08/18/2022, 9:06 AM

## 2022-08-18 NOTE — Plan of Care (Signed)

## 2022-08-18 NOTE — Progress Notes (Signed)
This encounter was created in error - please disregard.

## 2022-08-19 DIAGNOSIS — K859 Acute pancreatitis without necrosis or infection, unspecified: Secondary | ICD-10-CM | POA: Diagnosis not present

## 2022-08-19 DIAGNOSIS — K861 Other chronic pancreatitis: Secondary | ICD-10-CM | POA: Diagnosis not present

## 2022-08-19 LAB — GLUCOSE, CAPILLARY
Glucose-Capillary: 149 mg/dL — ABNORMAL HIGH (ref 70–99)
Glucose-Capillary: 138 mg/dL — ABNORMAL HIGH (ref 70–99)

## 2022-08-19 MED ORDER — OXYCODONE HCL 10 MG PO TABS: 10.0000 mg | ORAL_TABLET | ORAL | 0 refills | Status: AC | PRN

## 2022-08-19 NOTE — Inpatient Diabetes Management (Signed)
Inpatient Diabetes Program Recommendations  AACE/ADA: New Consensus Statement on Inpatient Glycemic Control (2015)  Target Ranges:  Prepandial:   less than 140 mg/dL      Peak postprandial:   less than 180 mg/dL (1-2 hours)      Critically ill patients:  140 - 180 mg/dL   Lab Results  Component Value Date   GLUCAP 138 (H) 08/19/2022   HGBA1C 7.0 (H) 07/20/2022    Review of Glycemic Control  Diabetes history: DM1 Outpatient Diabetes medications: OmniPod with Dexcom Current orders for Inpatient glycemic control: Semglee 10 QHS, Novolog 0-15 TID with meals and 0-5 HS  HgbA1C - 7% - very well controlled at baseline.  Basal: Pt receives 12 units of basal insulin within a 24 hour period Target 130 Carb ratio: 1 units of Novolog for every 12 grams of carbohydrates Sensitivity: 1 units of insulin drops glucose 50 points Duration of insulin 4 hours  Spoke with pt about his diabetes control, insulin pump and f/u with Endo. Pt has good understanding of pump and his Type 1 DM.   Inpatient Diabetes Program Recommendations:    Restart OmniPod this evening (wait approx 20 hours after last basal insulin dose)  F/U with Dr. Kelton Pillar after discharge.  Thank you. Lorenda Peck, RD, LDN, [place] Inpatient Diabetes Coordinator [phone]

## 2022-08-19 NOTE — Discharge Instructions (Signed)
Advised to follow-up with primary care physician in 1 week. Advised to follow-up with gastroenterology Dr. Marolyn Hammock on October 19, 2022.

## 2022-08-19 NOTE — Progress Notes (Signed)
Provided discharge education/instructions, all questions and concerns addressed. Pt not in acute distress, discharged home with belongings. 

## 2022-08-19 NOTE — Discharge Summary (Signed)
Physician Discharge Summary  Carlos [name] CWC:[id number] DOB: [birth year] DOA: 08/13/2022  PCP: [name], MD  Admit date: 08/13/2022  Discharge date: 08/19/2022  Admitted From: Home  Disposition:  Home.  Recommendations for Outpatient Follow-up:  Follow up with PCP in 1-2 weeks Please obtain BMP/CBC in one week Advised to follow-up with gastroenterology Dr. Mindi Curling on October 19, 2022.  Home Health:None Equipment/Devices:None  Discharge Condition: Stable CODE STATUS:Full code Diet recommendation: Heart Healthy  Brief Summary / Hospital Course: Carlos [name] is a 36 y.o. male with medical history significant of portal vein thrombus on Eliquis, chronic pancreatitis, type 1 diabetes, history of alcohol abuse, pancreatic duct stricture s/p stent presented to the hospital with acute abdominal pain.  Of note, patient had ERCP to evaluate pancreatic duct strictures by  GI Dr. Rush Landmark on 08/12/2022 and underwent pancreatic sphincterotomy and placement of pancreatic stent.  Postoperatively, he continued complaining of abdominal pain and was sent home with a short course of opioids.  He had minimal relief with pain medication and decided to come to ER due to ongoing pain.  In the ED, CT scan of the abdomen pelvis showed acute on chronic pancreatitis with possible pancreatic necrosis.  Patient did have chronic portal vein thrombosis without any apparent change.  Patient was admitted for further evaluation.  GI was consulted.  Patient was continued on IV hydration and IV pain control.  Pain was severe enough requiring Dilaudid PCA.  He was continued on bowel rest and slowly advanced to clear liquid,  tolerated to soft Bracher diet.  Pain reasonably controlled and switched to oral pain medications.  Cleared from GI to be discharged and follow-up outpatient with Dr. Rush Landmark on December 12.  Patient wants to be discharged.  patient is being discharged home.  Discharge Diagnoses:  Principal  Problem:   Acute on chronic pancreatitis (HCC) Active Problems:   Uncontrolled type 1 diabetes mellitus with hyperglycemia, with long-term current use of insulin (HCC)   Portal vein thrombosis   History of alcohol abuse   Intractable pain   Post-ERCP acute pancreatitis  Status post ERCP with acute on chronic pancreatitis with intractable pain. Status post ERCP and  pancreatic sphincterotomy and placement of pancreatic stent with [facility] Dr. Rush Landmark on 08/12/2022.Marland Kitchen   Repeat CT scan of the abdomen pelvis slowing worsened pancreatitis and possible necrosis.   Continue Pain control better with Dilaudid pump.  Has been advanced to full liquids.   Successfully transitioned to IV Dilaudid and oral oxycodone.  Dilaudid pump discontinued. Pain reasonably controlled.  Patient tolerated soft Deveny diet.  Cleared from GI to be discharged. Patient has appointment with GI Dr. Rush Landmark on October 19, 2022. Lipase normalized.  Pain significantly improved.  Patient was given prescription for Percocet for 2 days.   Uncontrolled type 1 diabetes mellitus with hyperglycemia, with long-term current use of insulin (HCC) Latest hemoglobin A1c of 7.0 on 07/20/2022.  Continue moderate scale sliding scale insulin.   Accu-Cheks diabetic diet.  Latest POC glucose of 159.  Patient is on insulin pump at home.   Continue home insulin dosing.   Portal vein thrombosis CT scan of the abdomen and pelvis with unchanged chronic portal vein thrombosis at this time.  Continue Eliquis.   History of alcohol abuse Has not taken alcohol recently.  Discharge Instructions  Discharge Instructions     Call MD for:  difficulty breathing, headache or visual disturbances   Complete by: As directed    Call MD for:  persistant dizziness  or light-headedness   Complete by: As directed    Call MD for:  persistant nausea and vomiting   Complete by: As directed    Diet - low sodium heart healthy   Complete by: As directed     Diet Carb Modified   Complete by: As directed    Discharge instructions   Complete by: As directed    Advised to follow-up with primary care physician in 1 week. Advised to follow-up with gastroenterology Dr. Marolyn Hammock on October 19, 2022.   Increase activity slowly   Complete by: As directed       Allergies as of 08/19/2022       Reactions   Other Anaphylaxis   [name] - anaphylaxis        Medication List     TAKE these medications    acetaminophen 325 MG tablet Commonly known as: TYLENOL Take 2 tablets (650 mg total) by mouth every 6 (six) hours as needed for mild pain (or Fever >/= 101). Notes to patient: Resume home regimen   ADVANCED DIABETIC MULTIVITAMIN PO Take 1 tablet by mouth daily. Notes to patient: Resume home regimen   apixaban 5 MG Tabs tablet Commonly known as: Eliquis Take 1 tablet (5 mg total) by mouth 2 (two) times daily.   Creon 36000 UNITS Cpep capsule Generic drug: lipase/protease/amylase TAKE 2 CAPSULES BY MOUTH THREE TIMES DAILY WITH MEALS MAY ALSO TAKE 1 CAPSULE AS NEEDED WITH SNACKS What changed: See the new instructions.   diphenhydramine-acetaminophen 25-500 MG Tabs tablet Commonly known as: TYLENOL PM Take 2 tablets by mouth at bedtime as needed (sleep, pain). Notes to patient: Resume home regimen   HYDROmorphone 4 MG tablet Commonly known as: Dilaudid Take 1 tablet (4 mg total) by mouth every 4 (four) hours as needed for severe pain. Notes to patient: Last dose given 10/11 11:11pm   insulin aspart 100 UNIT/ML injection Commonly known as: novoLOG Inject 120-150 Units into the skin See admin instructions. 120-150 units into insulin pump every 3 days Notes to patient: Resume home regimen   omeprazole 40 MG capsule Commonly known as: PRILOSEC Take 1 capsule (40 mg total) by mouth daily.   Omnipod 5 G6 Intro (Gen 5) Kit 1 Device by Does not apply route every 3 (three) days. Notes to patient: Resume home regimen    ondansetron 8 MG tablet Commonly known as: Zofran Take 1 tablet (8 mg total) by mouth every 8 (eight) hours as needed for nausea or vomiting. Notes to patient: Last dose given 10/06 09:50pm   Oxycodone HCl 10 MG Tabs Take 1 tablet (10 mg total) by mouth every 4 (four) hours as needed for up to 2 days for moderate pain. What changed:  medication strength how much to take when to take this reasons to take this Notes to patient: Last dose given 10/12 08:32am        Follow-up Information     [name], MD Follow up in 1 week(s).   Specialties: Family Medicine, Emergency Medicine Contact information: [place] [address] [phone]         [name], Telford Nab., MD Follow up in 2 month(s).   Specialties: Gastroenterology, Internal Medicine Contact information: [place] [address] [phone]                Allergies  Allergen Reactions   Other Anaphylaxis    [name] - anaphylaxis    Consultations: Gastroenteology   Procedures/Studies: CT Abdomen  Pelvis W Contrast  Result Date: 08/13/2022 CLINICAL DATA:  Postoperative abdominal pain. History of pancreatic stent placed for pancreatitis. EXAM: CT ABDOMEN AND PELVIS WITH CONTRAST TECHNIQUE: Multidetector CT imaging of the abdomen and pelvis was performed using the standard protocol following bolus administration of intravenous contrast. RADIATION DOSE REDUCTION: This exam was performed according to the departmental dose-optimization program which includes automated exposure control, adjustment of the mA and/or kV according to patient size and/or use of iterative reconstruction technique. CONTRAST:  174m OMNIPAQUE IOHEXOL 300 MG/ML  SOLN COMPARISON:  07/19/2022 FINDINGS: Lower chest: Lung bases are clear. Hepatobiliary: No focal liver abnormality is seen. Chronic portal vein thrombosis with cavernous transformation. No change. Findings are less well demonstrated than on  the previous study due to the stage of contrast bolus. No gallstones, gallbladder wall thickening, or biliary dilatation. Pancreas: A pancreatic duct stent is in place. Heterogeneous enlarged appearance of the pancreas with low-attenuation changes suggested in the head and body of the pancreas, likely representing pancreatic necrosis. Mild stranding around the pancreas. Scattered calcifications suggest acute on chronic pancreatitis. Follow-up after resolution of acute process is recommended to exclude underlying pancreatic mass. Spleen: Normal in size without focal abnormality. Adrenals/Urinary Tract: Adrenal glands are unremarkable. Kidneys are normal, without renal calculi, focal lesion, or hydronephrosis. Bladder is unremarkable. Stomach/Bowel: Stomach is within normal limits. Appendix appears normal. No evidence of bowel wall thickening, distention, or inflammatory changes. Vascular/Lymphatic: No significant vascular findings are present. No enlarged abdominal or pelvic lymph nodes. Reproductive: Prostate is unremarkable. Other: No abdominal wall hernia or abnormality. No abdominopelvic ascites. Musculoskeletal: No acute or significant osseous findings. IMPRESSION: 1. Changes of acute pancreatitis with enlarged edematous pancreatic parenchyma. Low-attenuation changes suggest likely pancreatic necrosis. No loculated collections. A pancreatic duct stent is in place. Calcifications in the pancreas suggest evidence of chronic pancreatitis. 2. Chronic portal venous thrombosis without apparent change since prior study. Electronically Signed   By: WLucienne CapersM.D.   On: 08/13/2022 23:21   DG ERCP  Result Date: 08/12/2022 CLINICAL DATA:  History of pancreatitis.  ERCP EXAM: ERCP TECHNIQUE: Multiple spot images obtained with the fluoroscopic device and submitted for interpretation post-procedure. FLUOROSCOPY TIME: FLUOROSCOPY TIME 3 minutes, 8 seconds (18.7 mGy) COMPARISON:  CT abdomen and pelvis-07/19/2022;  MRCP-06/02/2022 FINDINGS: Fourteen spot intraoperative fluoroscopic images of the right upper abdominal quadrant during ERCP are provided for review Initial image demonstrates an ERCP probe overlying the right upper abdominal quadrant. Subsequent images demonstrate selective cannulation of the pancreatic duct, initially with cannulation of a pancreatic head side branch, ultimately with cannulation of the main pancreatic duct. Contrast injection suggests mild dilatation at the mid body of the pancreas Subsequent images demonstrate placement of an internal pancreatic stent. IMPRESSION: ERCP with pancreatic stent placement as above. These images were submitted for radiologic interpretation only. Please see the procedural report for the amount of contrast and the fluoroscopy time utilized. Electronically Signed   By: JSandi MariscalM.D.   On: 08/12/2022 12:00    Subjective: Patient was seen and examined at bedside.  Overnight events noted.   Patient reports feeling much improved.  Abdominal pain has improved.   He wants to be discharged.  Patient be discharged home.  Discharge Exam: Vitals:   08/18/22 2054 08/19/22 0531  BP: (!) 126/90 (!) 139/92  Pulse: 65 63  Resp: 13 15  Temp: 97.8 F (36.6 C) 97.8 F (36.6 C)  SpO2: 100% 100%   Vitals:   08/18/22 1230 08/18/22 1510 08/18/22 2054 08/19/22  0531  BP:   (!) 126/90 (!) 139/92  Pulse:   65 63  Resp: '16 16 13 15  ' Temp:   97.8 F (36.6 C) 97.8 F (36.6 C)  TempSrc:      SpO2: 100% 100% 100% 100%  Weight:      Height:        General: Pt is alert, awake, not in acute distress Cardiovascular: RRR, S1/S2 +, no rubs, no gallops Respiratory: CTA bilaterally, no wheezing, no rhonchi Abdominal: Soft, NT, ND, bowel sounds + Extremities: no edema, no cyanosis    The results of significant diagnostics from this hospitalization (including imaging, microbiology, ancillary and laboratory) are listed below for reference.     Microbiology: No  results found for this or any previous visit (from the past 240 hour(s)).   Labs: BNP (last 3 results) No results for input(s): "BNP" in the last 8760 hours. Basic Metabolic Panel: Recent Labs  Lab 08/13/22 1934 08/14/22 0625 08/15/22 0328 08/16/22 0316 08/18/22 0318  NA 135 132* 135 136 136  K 3.9 3.5 3.5 3.7 4.1  CL 101 102 102 102 102  CO2 '23 22 24 27 28  ' GLUCOSE 180* 154* 188* 252* 204*  BUN '10 8 8 6 8  ' CREATININE 0.85 0.64 0.70 0.82 0.80  CALCIUM 9.1 8.1* 8.4* 8.5* 8.9  MG  --   --  1.7 1.7 1.7   Liver Function Tests: Recent Labs  Lab 08/13/22 1934 08/14/22 0625 08/15/22 0328 08/16/22 0316 08/18/22 0318  AST 23 15 12* 14* 12*  ALT '14 13 12 11 12  ' ALKPHOS 74 61 58 66 72  BILITOT 1.0 1.1 0.8 0.6 0.4  PROT 7.7 6.5 6.5 6.3* 6.8  ALBUMIN 4.2 3.3* 3.3* 3.2* 3.5   Recent Labs  Lab 08/13/22 1934 08/18/22 0318  LIPASE 40 42   No results for input(s): "AMMONIA" in the last 168 hours. CBC: Recent Labs  Lab 08/13/22 1934 08/15/22 0328 08/16/22 0316 08/18/22 0318  WBC 6.3 7.2 4.1 3.9*  NEUTROABS 4.6  --   --   --   HGB 14.1 12.6* 11.6* 12.2*  HCT 42.2 38.2* 35.5* 37.3*  MCV 98.6 100.3* 102.0* 101.4*  PLT 187 140* 140* 174   Cardiac Enzymes: No results for input(s): "CKTOTAL", "CKMB", "CKMBINDEX", "TROPONINI" in the last 168 hours. BNP: Invalid input(s): "POCBNP" CBG: Recent Labs  Lab 08/18/22 1136 08/18/22 1638 08/18/22 2115 08/19/22 0738 08/19/22 1134  GLUCAP 133* 120* 398* 149* 138*   D-Dimer No results for input(s): "DDIMER" in the last 72 hours. Hgb A1c No results for input(s): "HGBA1C" in the last 72 hours. Lipid Profile No results for input(s): "CHOL", "HDL", "LDLCALC", "TRIG", "CHOLHDL", "LDLDIRECT" in the last 72 hours. Thyroid function studies No results for input(s): "TSH", "T4TOTAL", "T3FREE", "THYROIDAB" in the last 72 hours.  Invalid input(s): "FREET3" Anemia work up Recent Labs    08/18/22 1234  VITAMINB12 390  FOLATE 17.6    Urinalysis    Component Value Date/Time   COLORURINE YELLOW 07/19/2022 [place] 07/19/2022 1843   LABSPEC 1.027 07/19/2022 1843   PHURINE 6.0 07/19/2022 1843   GLUCOSEU >=500 (A) 07/19/2022 1843   HGBUR NEGATIVE 07/19/2022 1843   BILIRUBINUR NEGATIVE 07/19/2022 1843   KETONESUR 5 (A) 07/19/2022 1843   PROTEINUR NEGATIVE 07/19/2022 1843   NITRITE NEGATIVE 07/19/2022 1843   LEUKOCYTESUR NEGATIVE 07/19/2022 1843   Sepsis Labs Recent Labs  Lab 08/13/22 1934 08/15/22 0328 08/16/22 0316 08/18/22 0318  WBC 6.3 7.2  4.1 3.9*   Microbiology No results found for this or any previous visit (from the past 240 hour(s)).   Time coordinating discharge: Over 30 minutes  SIGNED:   Shawna Clamp, MD  [facility] 08/19/2022, 3:00 PM Pager   If 7PM-7AM, please contact night-coverage

## 2022-08-19 NOTE — Plan of Care (Signed)
  Problem: Education: Goal: Ability to describe self-care measures that may prevent or decrease complications (Diabetes Survival Skills Education) will improve Outcome: Progressing   Problem: Coping: Goal: Ability to adjust to condition or change in health will improve Outcome: Progressing   Problem: Fluid Volume: Goal: Ability to maintain a balanced intake and output will improve Outcome: Progressing   Problem: Clinical Measurements: Goal: Ability to maintain clinical measurements within normal limits will improve Outcome: Progressing   Problem: Activity: Goal: Risk for activity intolerance will decrease Outcome: Progressing   Problem: Nutrition: Goal: Adequate nutrition will be maintained Outcome: Progressing

## 2022-08-19 NOTE — Progress Notes (Signed)
[facility] Progress Note  CC:  Post ERCP pancreatitis  Subjective: He is tolerating a full liquid diet.  He passed a normal brown bowel movement around 4 AM today.  No nausea or vomiting.  He continues to have epigastric pain which has not worsened or improved over the past few days.  No chest pain or shortness of breath.  Objective:  Vital signs in last 24 hours: Temp:  [97.8 F (36.6 C)] 97.8 F (36.6 C) (10/12 0531) Pulse Rate:  [63-65] 63 (10/12 0531) Resp:  [13-16] 15 (10/12 0531) BP: (126-139)/(90-92) 139/92 (10/12 0531) SpO2:  [100 %] 100 % (10/12 0531) FiO2 (%):  [0 %] 0 % (10/11 1510) Last BM Date : 08/18/22 General:  Alert, well-developed in NAD Heart: Regular rhythm, no murmurs. Pulm: Breath sounds clear throughout. Abdomen: Soft, nondistended.  Epigastric tenderness without rebound or guarding.  Positive bowel sounds to all 4 quadrants.  No hepatosplenomegaly. Extremities:  Without edema. Neurologic:  Alert and  oriented x 4. Grossly normal neurologically. Psych:  Alert and cooperative. Normal mood and affect.  Intake/Output from previous day: 10/11 0701 - 10/12 0700 In: 4094.7 [P.O.:720; I.V.:3374.7] Out: -  Intake/Output this shift: Total I/O In: 414.3 [I.V.:414.3] Out: -   Lab Results: Recent Labs    08/18/22 0318  WBC 3.9*  HGB 12.2*  HCT 37.3*  PLT 174   BMET Recent Labs    08/18/22 0318  NA 136  K 4.1  CL 102  CO2 28  GLUCOSE 204*  BUN 8  CREATININE 0.80  CALCIUM 8.9   LFT Recent Labs    08/18/22 0318  PROT 6.8  ALBUMIN 3.5  AST 12*  ALT 12  ALKPHOS 72  BILITOT 0.4   PT/INR No results for input(s): "LABPROT", "INR" in the last 72 hours. Hepatitis Panel No results for input(s): "HEPBSAG", "HCVAB", "HEPAIGM", "HEPBIGM" in the last 72 hours.  No results found.  Brief History 36 yo male known to Dr. 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 Seen in  consultation by GI on 10/7. Patient was previously followed by Dr. 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 Cause of pancreatitis wasn't clear. He was referred to [facility] for ERCP with manometry. [facility] saw him 7/31, felt there was a PD stricture based on MRI / MRCP results. ON 10/5 he underwent ERCP ( [name]) with pancreatic sphincterotomy and with stent placement in ventral duct of pancreas for a stricture. Subsequent developed abdominal pain, N/V . Admitted with post -ERCP pancreatitis .   Assessment / Plan:  36 year old male with a history of chronic pancreatitis  s/p ERCP with pancreatic sphincterotomy and stent placement to ventral duct of pancreas for PD stricture on 08/12/2022 who was admitted to the hospital with abdominal pain secondary to post ERCP pancreatitis.  CTAP 10/6 identified acute pancreatitis with enlarged edematous pancreatic parenchyma and  possible pancreatic necrosis with a patent PD stent.  Labs 08/18/2022 total bili 0.4.  Alk phos 72.  AST 12.  ALT 12.  Lipase 42.  Afebrile.  Hemodynamically stable. -Advance to soft diet as tolerated -Continue Pantoprazole 80 mg p.o. daily -Pain management per the hospitalist -Await further recommendations per Dr. Carlean Purl  Macrocytic anemia.  Stable hemoglobin 12.2 on 08/18/2022.  B12 390.  Chronic portal vein thrombosis on Eliquis    Principal Problem:   Acute on chronic pancreatitis (HCC) Active Problems:  Uncontrolled type 1 diabetes mellitus with hyperglycemia, with long-term current use of insulin (HCC)   Portal vein thrombosis   History of alcohol abuse   Intractable pain   Post-ERCP acute pancreatitis     LOS: 5 days   WEBB WEED  08/19/2022, 10:06 AM

## 2022-08-24 ENCOUNTER — Other Ambulatory Visit: Payer: Self-pay

## 2022-08-24 DIAGNOSIS — G8929 Other chronic pain: Secondary | ICD-10-CM

## 2022-08-24 DIAGNOSIS — K860 Alcohol-induced chronic pancreatitis: Secondary | ICD-10-CM

## 2022-08-24 NOTE — Telephone Encounter (Signed)
Recently hospitalized and needs to follow up. Also not on his discharge summary and not a long term medication

## 2022-08-24 NOTE — Telephone Encounter (Signed)
Message routed to PCP Sabra Heck Lillette Boxer, MD for input.

## 2022-08-24 NOTE — Telephone Encounter (Signed)
Message routed to Jessica Eubanks, NP 

## 2022-08-24 NOTE — Telephone Encounter (Signed)
Needs in office follow up before any narcotics can be sent in

## 2022-08-24 NOTE — Telephone Encounter (Signed)
Carlos [name]  [facility] Pool (supporting Dinah C [name], NP) 20 minutes ago (3:07 PM)    When's is the soonest I can get in? Dr. Kelby Fam said all I have to is request my prescriptions.  And follow up in December.. what am I missing?

## 2022-08-24 NOTE — Telephone Encounter (Signed)
MyChart message sent to patient.

## 2022-08-24 NOTE — Telephone Encounter (Signed)
I see that it is still on his list, his PCP will need to refill. He also needs a hospital follow up in office

## 2022-08-24 NOTE — Telephone Encounter (Signed)
Patient has request refill for Hydromorphone through MyChart. Patient last refill dated 07/26/2022. Patient has Opioid treatment agreement dated 12/22/2021. Patient medication pend and sent to Sherrie Mustache, NP due to PCP Sabra Heck Lillette Boxer, MD being out of office.

## 2022-08-25 ENCOUNTER — Other Ambulatory Visit: Payer: Self-pay

## 2022-08-25 DIAGNOSIS — F1020 Alcohol dependence, uncomplicated: Secondary | ICD-10-CM

## 2022-08-25 DIAGNOSIS — G8929 Other chronic pain: Secondary | ICD-10-CM

## 2022-08-25 MED ORDER — HYDROMORPHONE HCL 4 MG PO TABS: 4.0000 mg | ORAL_TABLET | ORAL | 0 refills | Status: DC | PRN

## 2022-08-25 NOTE — Telephone Encounter (Signed)
MyChart message sent to patient.

## 2022-08-25 NOTE — Telephone Encounter (Signed)
  And I only received the 180 for 30 days     Costella Hatcher  You 29 minutes ago (11:00 AM)    The pharmacy is say I'm last prescription was for 90 day    Costella Hatcher  You 29 minutes ago (11:00 AM)    The pharmacy is say I'm last prescription was for 90 day

## 2022-08-25 NOTE — Telephone Encounter (Signed)
Refill hydromorphone 4mg 

## 2022-08-25 NOTE — Telephone Encounter (Signed)
Message routed to PCP Miller, Stephen M, MD  ?

## 2022-08-31 NOTE — Telephone Encounter (Signed)
Refill hydromorphone for chronic pain

## 2022-09-17 ENCOUNTER — Telehealth: Payer: Self-pay

## 2022-09-17 DIAGNOSIS — Z8719 Personal history of other diseases of the digestive system: Secondary | ICD-10-CM

## 2022-09-17 DIAGNOSIS — K863 Pseudocyst of pancreas: Secondary | ICD-10-CM

## 2022-09-17 NOTE — Telephone Encounter (Signed)
CT order entered and pt aware to call if he has not been scheduled for CT the week of 10/11/22.

## 2022-09-17 NOTE — Telephone Encounter (Signed)
-----   Message from Lemar Lofty., MD sent at 09/17/2022  3:43 AM EST ----- Regarding: Follow up imaging Carlos [name], Have this patient get a CT abdomen pancreas protocol 1 week before his clinic visit in December. Thanks. GM

## 2022-09-19 ENCOUNTER — Other Ambulatory Visit: Payer: Self-pay | Admitting: Internal Medicine

## 2022-09-22 ENCOUNTER — Other Ambulatory Visit: Payer: Self-pay | Admitting: Family Medicine

## 2022-09-22 ENCOUNTER — Other Ambulatory Visit: Payer: Self-pay | Admitting: Gastroenterology

## 2022-09-22 DIAGNOSIS — G8929 Other chronic pain: Secondary | ICD-10-CM

## 2022-09-22 DIAGNOSIS — K860 Alcohol-induced chronic pancreatitis: Secondary | ICD-10-CM

## 2022-09-22 MED ORDER — APIXABAN 5 MG PO TABS: 5.0000 mg | ORAL_TABLET | Freq: Two times a day (BID) | ORAL | Status: DC

## 2022-09-22 MED ORDER — HYDROMORPHONE HCL 4 MG PO TABS: 4.0000 mg | ORAL_TABLET | ORAL | 0 refills | Status: DC | PRN

## 2022-09-22 MED ORDER — PANCRELIPASE (LIP-PROT-AMYL) 36000-114000 UNITS PO CPEP: 72000.0000 [IU] | ORAL_CAPSULE | Freq: Three times a day (TID) | ORAL | 3 refills | Status: DC

## 2022-09-22 NOTE — Telephone Encounter (Signed)
Hydromorphone requested by Patient.  Rx has already been sent over to pharmacy with Confirmation by Dr. Hyacinth Meeker.     HYDROmorphone (DILAUDID) 4 MG tablet 180 tablet 0 09/22/2022    Sig - Route: Take 1 tablet (4 mg total) by mouth every 4 (four) hours as needed for severe pain. - Oral   Sent to pharmacy as: HYDROmorphone (DILAUDID) 4 MG tablet   Earliest Fill Date: 09/22/2022   Notes to Pharmacy: Pt has chronic pancreatitis; drug screens appropriate   E-Prescribing Status: Receipt confirmed by pharmacy (09/22/2022 12:05 PM

## 2022-09-22 NOTE — Telephone Encounter (Signed)
  Patient comment: Why was my prescription denied

## 2022-09-22 NOTE — Telephone Encounter (Signed)
Patient has request refill on medication Eliquis. Patient had this medication refilled by Dr.Gabriel [name]. Message routed to PCP Hyacinth Meeker Bertram Millard, MD for approval.

## 2022-09-22 NOTE — Telephone Encounter (Signed)
Patient has request refill on Hydrocodone. Medication pend and sent to PCP Hyacinth Meeker Bertram Millard, MD

## 2022-09-24 ENCOUNTER — Other Ambulatory Visit: Payer: Self-pay | Admitting: Family Medicine

## 2022-09-24 DIAGNOSIS — F1020 Alcohol dependence, uncomplicated: Secondary | ICD-10-CM

## 2022-09-24 DIAGNOSIS — G8929 Other chronic pain: Secondary | ICD-10-CM

## 2022-09-27 MED ORDER — HYDROMORPHONE HCL 4 MG PO TABS: 4.0000 mg | ORAL_TABLET | ORAL | 0 refills | Status: DC | PRN

## 2022-10-12 ENCOUNTER — Ambulatory Visit (HOSPITAL_COMMUNITY)
Admission: RE | Admit: 2022-10-12 | Discharge: 2022-10-12 | Disposition: A | Discharge status: Home / Self Care | Payer: Medicaid Other | Source: Ambulatory Visit | Attending: Gastroenterology | Admitting: Gastroenterology

## 2022-10-12 ENCOUNTER — Encounter (HOSPITAL_COMMUNITY): Payer: Self-pay

## 2022-10-12 DIAGNOSIS — K863 Pseudocyst of pancreas: Secondary | ICD-10-CM | POA: Diagnosis not present

## 2022-10-12 DIAGNOSIS — Z8719 Personal history of other diseases of the digestive system: Secondary | ICD-10-CM | POA: Diagnosis present

## 2022-10-12 LAB — POCT I-STAT CREATININE: Creatinine, Ser: 1 mg/dL (ref 0.61–1.24)

## 2022-10-12 MED ORDER — IOHEXOL 300 MG/ML  SOLN
100.0000 mL | Freq: Once | INTRAMUSCULAR | Status: AC | PRN
  Administered 2022-10-12: 100 mL via INTRAVENOUS

## 2022-10-19 ENCOUNTER — Encounter: Payer: Self-pay | Admitting: Gastroenterology

## 2022-10-19 ENCOUNTER — Ambulatory Visit (INDEPENDENT_AMBULATORY_CARE_PROVIDER_SITE_OTHER): Payer: Self-pay | Admitting: Gastroenterology

## 2022-10-19 VITALS — BP 130/80 | HR 93 | Ht 77.0 in | Wt 161.4 lb

## 2022-10-19 DIAGNOSIS — Z9889 Other specified postprocedural states: Secondary | ICD-10-CM

## 2022-10-19 DIAGNOSIS — Z7901 Long term (current) use of anticoagulants: Secondary | ICD-10-CM

## 2022-10-19 DIAGNOSIS — K863 Pseudocyst of pancreas: Secondary | ICD-10-CM

## 2022-10-19 DIAGNOSIS — K8689 Other specified diseases of pancreas: Secondary | ICD-10-CM

## 2022-10-19 DIAGNOSIS — K861 Other chronic pancreatitis: Secondary | ICD-10-CM

## 2022-10-19 DIAGNOSIS — G8929 Other chronic pain: Secondary | ICD-10-CM

## 2022-10-19 DIAGNOSIS — R1084 Generalized abdominal pain: Secondary | ICD-10-CM

## 2022-10-19 NOTE — Patient Instructions (Signed)
You have been scheduled for an endoscopy. Please follow written instructions given to you at your visit today. If you use inhalers (even only as needed), please bring them with you on the day of your procedure.    Due to recent changes in healthcare laws, you may see the results of your imaging and laboratory studies on MyChart before your provider has had a chance to review them.  We understand that in some cases there may be results that are confusing or concerning to you. Not all laboratory results come back in the same time frame and the provider may be waiting for multiple results in order to interpret others.  Please give [place] 48 hours in order for your provider to thoroughly review all the results before contacting the office for clarification of your results.   Thank you for choosing me and [facility].  Dr. [name]

## 2022-10-19 NOTE — Progress Notes (Signed)
[facility] VISIT   Primary Care Provider [name], [place] [address] [phone]   Patient Profile: Carlos [name] is a 36 y.o. 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  The patient presents to the [facility] for an evaluation and management of problem(s) noted below:  Problem List 1. Chronic pancreatitis, unspecified pancreatitis type ([place])   2. Stricture of pancreatic duct   3. History of ERCP   4. Pancreatic pseudocyst   5. Chronic generalized abdominal pain   6. Chronic anticoagulation      History of Present Illness Please see prior note for full details of HPI.  Interval History The patient returns for follow-up.  I last saw the patient for his ERCP which we successfully were able to traverse the pancreatic duct stricture and placed a retaining pancreas stent in place.  Unfortunately he developed post ERCP pancreatitis a few days later and ended up coming into the hospital with significant pain and discomfort.  Imaging was performed.  Actually he was able to be discharged.  He had a repeat imaging study done a week ago which is outlined below with the PD stent still being in place but evidence of persisting pancreatitis.  Talking with the patient today he does state that he has noticed some improvement with having the PD stent in place.  He is still having episodes of pain and discomfort but they seem to be a bit more manageable.  He has not required going to the emergency department in the last few months.  Patient's weight has decreased somewhat by approximately 8 pounds since our last evaluation in clinic.  He has some early satiety symptoms.  He denies  dysphagia symptoms.  He has not noted any changes in his bowel habits.  He has not been able to afford his pancreas enzyme replacement therapy but hopefully with his new insurance he will be able to.  He remains on anticoagulation for his history of previous splenic vein thrombosis/portal vein thrombosis.   GI Review of Systems Positive as above Negative for odynophagia, alteration of bowel habits, melena, hematochezia   Review of Systems General: Denies fevers/chills Cardiovascular: Denies chest pain Pulmonary: Denies shortness of breath Gastroenterological: See HPI Genitourinary: Denies darkened urine Hematological: Denies easy bruising/bleeding Dermatological: Denies jaundice Psychological: Mood is stable   Medications Current Outpatient Medications  Medication Sig Dispense Refill   acetaminophen (TYLENOL) 325 MG tablet Take 2 tablets (650 mg total) by mouth every 6 (six) hours as needed for mild pain (or Fever >/= 101).     apixaban (ELIQUIS) 5 MG TABS tablet Take 1 tablet (5 mg total) by mouth 2 (two) times daily. 60 tablet    diphenhydramine-acetaminophen (TYLENOL PM) 25-500 MG TABS tablet Take 2 tablets by mouth at bedtime as needed (sleep, pain).     HYDROmorphone (DILAUDID) 4 MG tablet Take 1 tablet (4 mg total) by mouth every 4 (four) hours as needed for severe pain. 180 tablet 0   insulin aspart (NOVOLOG) 100 UNIT/ML injection Inject 120-150 Units into the skin See admin instructions. 120-150 units into insulin pump every 3 days     Insulin Disposable Pump (OMNIPOD 5 G6 INTRO, GEN 5,) KIT 1 Device by Does not apply route every 3 (three) days. 1 kit 0  lipase/protease/amylase (CREON) 36000 UNITS CPEP capsule Take 2 capsules (72,000 Units total) by mouth 3 (three) times daily before meals. 240 capsule 3   Multiple Vitamins-Minerals (ADVANCED DIABETIC MULTIVITAMIN PO) Take 1 tablet by mouth daily.     omeprazole (PRILOSEC) 40 MG capsule Take 1 capsule (40 mg total) by mouth  daily. 90 capsule 1   ondansetron (ZOFRAN) 8 MG tablet Take 1 tablet (8 mg total) by mouth every 8 (eight) hours as needed for nausea or vomiting. 30 tablet 0   No current facility-administered medications for this visit.    Allergies Allergies  Allergen Reactions   Other Anaphylaxis    [name] - anaphylaxis    Histories Past Medical History:  Diagnosis Date   AKI (acute kidney injury) ([place]) 12/21/2021   Anxiety    Diabetes ([place])    Pancreatitis    Splenic vein thrombosis    Past Surgical History:  Procedure Laterality Date   BIOPSY  12/02/2021   Procedure: BIOPSY;  Surgeon: Rush Landmark Telford Nab., MD;  Location: [facility];  Service: Gastroenterology;;   ENDOSCOPIC RETROGRADE CHOLANGIOPANCREATOGRAPHY (ERCP) WITH PROPOFOL N/A 08/12/2022   Procedure: ENDOSCOPIC RETROGRADE CHOLANGIOPANCREATOGRAPHY (ERCP) WITH PROPOFOL;  Surgeon: Irving Copas., MD;  Location: [facility];  Service: Gastroenterology;  Laterality: N/A;   ESOPHAGOGASTRODUODENOSCOPY (EGD) WITH PROPOFOL N/A 12/02/2021   Procedure: ESOPHAGOGASTRODUODENOSCOPY (EGD) WITH PROPOFOL;  Surgeon: Rush Landmark Telford Nab., MD;  [facility]: [facility];  Service: Gastroenterology;  Laterality: N/A;   NO PAST SURGERIES     PANCREATIC STENT PLACEMENT  08/12/2022   Procedure: PANCREATIC STENT PLACEMENT;  Surgeon: Irving Copas., MD;  Location: [facility];  Service: Gastroenterology;;   Joan Mayans  08/12/2022   Procedure: Joan Mayans;  Surgeon: Irving Copas., MD;  Location: [facility];  Service: Gastroenterology;;   UPPER ESOPHAGEAL ENDOSCOPIC ULTRASOUND (EUS) N/A 12/02/2021   Procedure: UPPER ESOPHAGEAL ENDOSCOPIC ULTRASOUND (EUS);  Surgeon: Irving Copas., MD;  Location: [facility];  Service: Gastroenterology;  Laterality: N/A;   Social History   Socioeconomic History   Marital status: Single    Spouse name: Not on file   Number of children: 6   Years of education: Not on file    Highest education level: Not on file  Occupational History   Occupation: forklift driver  Tobacco Use   Smoking status: Some Days    Types: Cigars    Start date: 11/08/2002   Smokeless tobacco: Never   Tobacco comments:    Once a week.  Vaping Use   Vaping Use: Never used  Substance and Sexual Activity   Alcohol use: Not Currently   Drug use: Never   Sexual activity: Not on file  Other Topics Concern   Not on file  Social History Narrative   Not on file   Social Determinants of Health   Financial Resource Strain: Not on file  Food Insecurity: No Food Insecurity (08/14/2022)   Hunger Vital Sign    Worried About Running Out of Food in the Last Year: Never true    Ran Out of Food in the Last Year: Never true  Transportation Needs: No Transportation Needs (08/14/2022)   PRAPARE - Hydrologist (Medical): No    Lack of Transportation (Non-Medical): No  Physical Activity: Not on file  Stress: Not on file  Social Connections: Not on file  Intimate Partner Violence: Not At Risk (08/14/2022)   Humiliation, Afraid, Rape, and Kick questionnaire    Fear of Current or Ex-Partner: No  Emotionally Abused: No    Physically Abused: No    Sexually Abused: No   Family History  Problem Relation Age of Onset   Hypertension Mother    Alcoholism Maternal Uncle    Diabetes Maternal Grandmother    Diabetes Maternal Grandfather    Diabetes Paternal Grandmother    Diabetes Paternal Grandfather    Colon cancer Neg Hx    Esophageal cancer Neg Hx    Inflammatory bowel disease Neg Hx    Liver disease Neg Hx    Pancreatic cancer Neg Hx    Rectal cancer Neg Hx    Stomach cancer Neg Hx    I have reviewed his medical, social, and family history in detail and updated the electronic medical record as necessary.    PHYSICAL EXAMINATION  BP 130/80 (BP Location: Left Arm, Patient Position: Sitting, Cuff Size: Normal)   Pulse 93   Ht _0  (1.956 m)   Wt 161 lb 6.4  oz (73.2 kg)   SpO2 98%   BMI 19.14 kg/m  Wt Readings from Last 3 Encounters:  10/19/22 161 lb 6.4 oz (73.2 kg)  08/13/22 170 lb (77.1 kg)  08/12/22 170 lb (77.1 kg)  GEN: NAD, appears stated age, doesn't appear chronically ill, accompanied by significant other PSYCH: Cooperative, without pressured speech EYE: Conjunctivae pink, sclerae anicteric ENT: MMM CV: Nontachycardic RESP: No audible wheezing GI: NABS, soft, TTP in MEG upon deep palpation, without rebound MSK/EXT: No lower extremity edema SKIN: No jaundice NEURO:  Alert & Oriented x 3, no focal deficits   REVIEW OF DATA  I reviewed the following data at the time of this encounter:  GI Procedures and Studies  10/23 ERCP - Previous endoscopic evaluation suggested significant gastritis which was not present on today's exam with the ERCP scope. - The major papilla appeared to be small. - Difficult cannulation initially but with maneuvers and transition to revolution Jagtome we were able to access the pancreatic tree. - An irregularity was found in the ventral pancreatic duct within the head/genu of the pancreas. With stricturing noted in the genu of the pancreas. - A pancreatic sphincterotomy was performed. - One plastic pancreatic stent was placed into the ventral pancreatic duct and traversed the strictured area.  Laboratory Studies  Reviewed those in epic  Imaging Studies  12/22 CTAbdomen IMPRESSION: 1. Similar appearance of acute on chronic pancreatitis with ill-defined fluid and stranding surrounding the pancreatic head. No discrete walled-off collection. 2. Similar configuration of the trans ampullary pancreatic ductal stent. 3. New 16 mm hypoattenuating focus within peripheral segment 2/3 of the liver, not definitely present on 08/13/2022. Given adjacent pancreatitis, this finding may represent infection/inflammation. MRI of the liver can be considered for further evaluation. 4. Aortic Atherosclerosis  (ICD10-I70.0).  10/23 CTAP IMPRESSION: 1. Changes of acute pancreatitis with enlarged edematous pancreatic parenchyma. Low-attenuation changes suggest likely pancreatic necrosis. No loculated collections. A pancreatic duct stent is in place. Calcifications in the pancreas suggest evidence of chronic pancreatitis. 2. Chronic portal venous thrombosis without apparent change since prior study.   ASSESSMENT  Mr. Arquette is a 36 y.o. 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  The patient is seen today for evaluation and management of:  1. Chronic pancreatitis, unspecified pancreatitis type ([place])   2. Stricture of pancreatic duct   3. History  of ERCP   4. Pancreatic pseudocyst   5. Chronic generalized abdominal pain   6. Chronic anticoagulation    The patient is clinically and hemodynamically stable today.  Patient seems to be somewhat improved after our PD stent placement in October, albeit he did get his post ERCP pancreatitis.  His repeat imaging study does show persisting symptoms and signs of pancreatitis but we were able to traverse the PD stricture at the time of ERCP.  It is coming up time for [place] to exchange PD stents with hope of continuing to dilate the stricture and subsequently hopefully eventually get him to a point where he will not need PD stenting.  The patient has had some improvement with his abdominal pains.  I am going to proceed with an endoscopic ultrasound at the time of his follow-up ERCP for celiac axis block to see if that aids in giving the patient some pain relief over the ensuing weeks/months.  Anticipate at the time of his pancreas ERCP to remove his stent attempt sweeping of the PD and if there still remains evidence  of a PD stricture or narrowing, try to place 2 stents or a larger diameter stent.  The risks of an EUS including intestinal perforation, bleeding, infection, aspiration, and medication effects were discussed as was the possibility it may not give a definitive diagnosis if a biopsy is performed.  When a biopsy of the pancreas is done as part of the EUS, there is an additional risk of pancreatitis at the rate of about 1-2%.  There is additional risk at the time of celiac block of temporary or permanent paralysis and hypotension that can develop which is a reason we would monitor and hydrate the patient after the procedure.  The risks of an ERCP were discussed at length, including but not limited to the risk of perforation, bleeding, abdominal pain, post-ERCP pancreatitis (while usually mild can be severe and even life threatening).  We will need to hold his blood thinner as per prior procedures.  All patient questions were answered to the best of my ability, and the patient agrees to the aforementioned plan of action with follow-up as indicated.   PLAN  Proceed with scheduling EUS for celiac blockade attempt Proceed with scheduling ERCP for pancreas stent exchange Restart Creon -72,000 units with each meal and 36,000 units with each snack Continue complete alcohol abstinence Continue to try to have a complete minimization of tobacco use    Orders Placed This Encounter  Procedures   Procedural/ Surgical Case Request: UPPER ESOPHAGEAL ENDOSCOPIC ULTRASOUND (EUS), ENDOSCOPIC RETROGRADE CHOLANGIOPANCREATOGRAPHY (ERCP) WITH PROPOFOL   Ambulatory referral to Gastroenterology    New Prescriptions   No medications on file   Modified Medications   No medications on file    Planned Follow Up No follow-ups on file.   Total Time in Face-to-Face and in Coordination of Care for patient including independent/personal interpretation/review of prior testing, medical history, examination, medication  adjustment, communicating results with the patient directly, reviewing his imaging, and documentation within the EHR is 30 minutes.   Justice Britain, MD [facility] Office # [phone]

## 2022-10-20 ENCOUNTER — Encounter: Payer: Self-pay | Admitting: Gastroenterology

## 2022-10-20 DIAGNOSIS — K8689 Other specified diseases of pancreas: Secondary | ICD-10-CM | POA: Insufficient documentation

## 2022-10-20 DIAGNOSIS — G8929 Other chronic pain: Secondary | ICD-10-CM | POA: Insufficient documentation

## 2022-10-20 DIAGNOSIS — R1084 Generalized abdominal pain: Secondary | ICD-10-CM | POA: Insufficient documentation

## 2022-10-20 DIAGNOSIS — Z9889 Other specified postprocedural states: Secondary | ICD-10-CM | POA: Insufficient documentation

## 2022-10-24 ENCOUNTER — Other Ambulatory Visit: Payer: Self-pay | Admitting: Family Medicine

## 2022-10-24 DIAGNOSIS — G8929 Other chronic pain: Secondary | ICD-10-CM

## 2022-10-24 DIAGNOSIS — K860 Alcohol-induced chronic pancreatitis: Secondary | ICD-10-CM

## 2022-10-25 NOTE — Telephone Encounter (Signed)
Pharmacy requested refill.  Epic LR: 09/27/2022 Contract Date: 12/22/2021 Pended Rx and sent to Dr. Hyacinth Meeker for approval.

## 2022-10-27 ENCOUNTER — Ambulatory Visit: Payer: Medicaid Other | Admitting: Family Medicine

## 2022-10-27 MED ORDER — HYDROMORPHONE HCL 4 MG PO TABS: 4.0000 mg | ORAL_TABLET | ORAL | 0 refills | Status: DC | PRN

## 2022-11-03 ENCOUNTER — Encounter: Payer: Self-pay | Admitting: Family Medicine

## 2022-11-03 ENCOUNTER — Ambulatory Visit (INDEPENDENT_AMBULATORY_CARE_PROVIDER_SITE_OTHER): Payer: Medicaid Other | Admitting: Family Medicine

## 2022-11-03 VITALS — BP 130/80 | HR 87 | Temp 97.7°F | Ht 77.0 in | Wt 165.0 lb

## 2022-11-03 DIAGNOSIS — F1020 Alcohol dependence, uncomplicated: Secondary | ICD-10-CM

## 2022-11-03 DIAGNOSIS — R1013 Epigastric pain: Secondary | ICD-10-CM | POA: Diagnosis not present

## 2022-11-03 DIAGNOSIS — K859 Acute pancreatitis without necrosis or infection, unspecified: Secondary | ICD-10-CM | POA: Diagnosis not present

## 2022-11-03 DIAGNOSIS — F1190 Opioid use, unspecified, uncomplicated: Secondary | ICD-10-CM | POA: Diagnosis not present

## 2022-11-03 DIAGNOSIS — I81 Portal vein thrombosis: Secondary | ICD-10-CM

## 2022-11-03 DIAGNOSIS — E1065 Type 1 diabetes mellitus with hyperglycemia: Secondary | ICD-10-CM | POA: Diagnosis not present

## 2022-11-03 DIAGNOSIS — Z7901 Long term (current) use of anticoagulants: Secondary | ICD-10-CM

## 2022-11-03 DIAGNOSIS — G8929 Other chronic pain: Secondary | ICD-10-CM

## 2022-11-03 DIAGNOSIS — K861 Other chronic pancreatitis: Secondary | ICD-10-CM

## 2022-11-03 NOTE — Progress Notes (Signed)
Provider:  Alain Honey, MD  Careteam: Patient Care Team: [name], MD as PCP - General (Family Medicine) Jodi Marble, MD as Consulting Physician (Otolaryngology)  PLACE OF SERVICE:  [facility] Directive information    Allergies  Allergen Reactions   Other Anaphylaxis    [name] - anaphylaxis    No chief complaint on file.    HPI: Patient is a 36 y.o. male he has a history of chronic pancreatitis.  He has a stent in the pancreatic duct and the plan is to do ERCP in January with a larger stent in hopes of keeping the pancreatic duct open.  He also has had previous pseudocyst splenic vein thrombosis (on anticoagulation, diabetes and chronic pain which is managed with Dilaudid, which she takes 2-3 times per day.  He has signed a drug contract and we do regular drug screens to be sure of compliance. His diabetes has been well-controlled with insulin pump.  Most recent A1c was 3 months ago, 7.0  Review of Systems:  Review of Systems  Gastrointestinal:  Positive for abdominal pain.  Neurological: Negative.   Psychiatric/Behavioral:  The patient is nervous/anxious.   All other systems reviewed and are negative.   Past Medical History:  Diagnosis Date   AKI (acute kidney injury) ([place]) 12/21/2021   Anxiety    Diabetes ([place])    Pancreatitis    Splenic vein thrombosis    Past Surgical History:  Procedure Laterality Date   BIOPSY  12/02/2021   Procedure: BIOPSY;  Surgeon: Rush Landmark Telford Nab., MD;  [facility];  Service: Gastroenterology;;   ENDOSCOPIC RETROGRADE CHOLANGIOPANCREATOGRAPHY (ERCP) WITH PROPOFOL N/A 08/12/2022   Procedure: ENDOSCOPIC RETROGRADE CHOLANGIOPANCREATOGRAPHY (ERCP) WITH PROPOFOL;  Surgeon: Irving Copas., MD;  Location: [facility];  Service: Gastroenterology;  Laterality: N/A;   ESOPHAGOGASTRODUODENOSCOPY (EGD) WITH PROPOFOL N/A 12/02/2021   Procedure: ESOPHAGOGASTRODUODENOSCOPY (EGD) WITH PROPOFOL;   Surgeon: Rush Landmark Telford Nab., MD;  Location: [facility];  Service: Gastroenterology;  Laterality: N/A;   NO PAST SURGERIES     PANCREATIC STENT PLACEMENT  08/12/2022   Procedure: PANCREATIC STENT PLACEMENT;  Surgeon: Irving Copas., MD;  Location: [facility];  Service: Gastroenterology;;   Joan Mayans  08/12/2022   Procedure: Joan Mayans;  Surgeon: Irving Copas., MD;  Location: [facility];  Service: Gastroenterology;;   UPPER ESOPHAGEAL ENDOSCOPIC ULTRASOUND (EUS) N/A 12/02/2021   Procedure: UPPER ESOPHAGEAL ENDOSCOPIC ULTRASOUND (EUS);  Surgeon: Irving Copas., MD;  Location: [facility];  Service: Gastroenterology;  Laterality: N/A;   Social History:   reports that he has been smoking cigars. He started smoking about 20 years ago. He has never used smokeless tobacco. He reports that he does not currently use alcohol. He reports that he does not use drugs.  Family History  Problem Relation Age of Onset   Hypertension Mother    Alcoholism Maternal Uncle    Diabetes Maternal Grandmother    Diabetes Maternal Grandfather    Diabetes Paternal Grandmother    Diabetes Paternal Grandfather    Colon cancer Neg Hx    Esophageal cancer Neg Hx    Inflammatory bowel disease Neg Hx    Liver disease Neg Hx    Pancreatic cancer Neg Hx    Rectal cancer Neg Hx    Stomach cancer Neg Hx     Medications: Patient's Medications  New Prescriptions   No medications on file  Previous Medications   ACETAMINOPHEN (TYLENOL) 325 MG TABLET    Take 2 tablets (650  mg total) by mouth every 6 (six) hours as needed for mild pain (or Fever >/= 101).   APIXABAN (ELIQUIS) 5 MG TABS TABLET    Take 1 tablet (5 mg total) by mouth 2 (two) times daily.   DIPHENHYDRAMINE-ACETAMINOPHEN (TYLENOL PM) 25-500 MG TABS TABLET    Take 2 tablets by mouth at bedtime as needed (sleep, pain).   HYDROMORPHONE (DILAUDID) 4 MG TABLET    Take 1 tablet (4 mg total) by mouth every 4 (four) hours as  needed for severe pain.   INSULIN ASPART (NOVOLOG) 100 UNIT/ML INJECTION    Inject 120-150 Units into the skin See admin instructions. 120-150 units into insulin pump every 3 days   INSULIN DISPOSABLE PUMP (OMNIPOD 5 G6 INTRO, GEN 5,) KIT    1 Device by Does not apply route every 3 (three) days.   LIPASE/PROTEASE/AMYLASE (CREON) 36000 UNITS CPEP CAPSULE    Take 2 capsules (72,000 Units total) by mouth 3 (three) times daily before meals.   MULTIPLE VITAMINS-MINERALS (ADVANCED DIABETIC MULTIVITAMIN PO)    Take 1 tablet by mouth daily.   OMEPRAZOLE (PRILOSEC) 40 MG CAPSULE    Take 1 capsule (40 mg total) by mouth daily.   ONDANSETRON (ZOFRAN) 8 MG TABLET    Take 1 tablet (8 mg total) by mouth every 8 (eight) hours as needed for nausea or vomiting.  Modified Medications   No medications on file  Discontinued Medications   No medications on file    Physical Exam:  Vitals:   11/03/22 0825  Weight: 165 lb (74.8 kg)   Body mass index is 19.57 kg/m. Wt Readings from Last 3 Encounters:  11/03/22 165 lb (74.8 kg)  10/19/22 161 lb 6.4 oz (73.2 kg)  08/13/22 170 lb (77.1 kg)    Physical Exam Vitals and nursing note reviewed.  Constitutional:      Appearance: Normal appearance.  Cardiovascular:     Rate and Rhythm: Normal rate and regular rhythm.  Pulmonary:     Effort: Pulmonary effort is normal.     Breath sounds: Normal breath sounds.  Abdominal:     General: Bowel sounds are normal.     Palpations: Abdomen is soft.  Skin:    General: Skin is warm and dry.  Neurological:     General: No focal deficit present.     Mental Status: He is alert and oriented to person, place, and time.  Psychiatric:        Mood and Affect: Mood normal.        Behavior: Behavior normal.     Labs reviewed: Basic Metabolic Panel: Recent Labs    12/21/21 0342 12/21/21 0900 03/10/22 1511 04/23/22 1254 07/22/22 0541 08/02/22 0914 08/15/22 0328 08/16/22 0316 08/18/22 0318 10/12/22 1631  NA  125*   < >  --    < > 138   < > 135 136 136  --   K 4.3   < >  --    < > 4.4   < > 3.5 3.7 4.1  --   CL 82*   < >  --    < > 99   < > 102 102 102  --   CO2 27   < >  --    < > 30   < > _0 --   GLUCOSE 776*   < >  --    < > 148*   < > 188* 252* 204*  --   BUN  20   < >  --    < > <5*   < > _0 --   CREATININE 1.17   < >  --    < > 0.93   < > 0.70 0.82 0.80 1.00  CALCIUM 10.5*   < >  --    < > 9.9   < > 8.4* 8.5* 8.9  --   MG 3.0*  --   --    < > 1.9  --  1.7 1.7 1.7  --   PHOS 5.8*  --   --   --  4.0  --   --   --   --   --   TSH  --   --  0.81  --   --   --   --   --   --   --    < > = values in this interval not displayed.   Liver Function Tests: Recent Labs    08/15/22 0328 08/16/22 0316 08/18/22 0318  AST 12* 14* 12*  ALT _1 ALKPHOS 58 66 72  BILITOT 0.8 0.6 0.4  PROT 6.5 6.3* 6.8  ALBUMIN 3.3* 3.2* 3.5   Recent Labs    03/05/22 1239 04/23/22 1254 07/19/22 1854 08/13/22 1934 08/18/22 0318  LIPASE 56.0   < > 76* 40 42  AMYLASE 73  --   --   --   --    < > = values in this interval not displayed.   No results for input(s): "AMMONIA" in the last 8760 hours. CBC: Recent Labs    07/20/22 0215 07/21/22 0400 08/02/22 0914 08/13/22 1934 08/15/22 0328 08/16/22 0316 08/18/22 0318  WBC 4.5   < > 5.5 6.3 7.2 4.1 3.9*  NEUTROABS 2.8  --  3,410 4.6  --   --   --   HGB 12.8*   < > 13.1* 14.1 12.6* 11.6* 12.2*  HCT 39.3   < > 37.8* 42.2 38.2* 35.5* 37.3*  MCV 102.9*   < > 97.4 98.6 100.3* 102.0* 101.4*  PLT 173   < > 230 187 140* 140* 174   < > = values in this interval not displayed.   Lipid Panel: Recent Labs    03/10/22 1511 04/27/22 0953 07/20/22 0215  CHOL 104  --   --   HDL 56.50  --   --   LDLCALC 18  --   --   TRIG 149.0 90 153*  CHOLHDL 2  --   --    TSH: Recent Labs    03/10/22 1511  TSH 0.81   A1C: Lab Results  Component Value Date   HGBA1C 7.0 (H) 07/20/2022     Assessment/Plan  1. Type 1 diabetes mellitus with  hyperglycemia ([place]) Will recheck A1c today.  Control of diabetes has been very good.  2. Opioid use Necessary for chronic abdominal pain due to pancreatitis  3. Abdominal pain, chronic, epigastric He has pain on a daily basis that is managed with use of Dilaudid  4. Acute on chronic pancreatitis ([place]) Most recent flareup was after ERCP.  This required a visit to the ER.  Also getting back on his pancreatic enzyme supplement now since he is able to afford  5. Uncomplicated alcohol dependence ([place]) Has done really well since the onset of pancreatitis and complications he denies use of alcohol  6. Chronic anticoagulation Continues with Eliquis  7. Portal vein thrombosis As above continue with  Eliquis  8. Uncontrolled type 1 diabetes mellitus with hyperglycemia, with long-term current use of insulin ([facility]) Using insulin pump reports no problems or issues and A1c suggest control was at goal   Alain Honey, MD [facility] [phone]

## 2022-11-06 LAB — DRUG MONITOR, PANEL 1, W/CONF, URINE
Amphetamines: NEGATIVE ng/mL (ref <500)
Barbiturates: NEGATIVE ng/mL (ref <300)
Benzodiazepines: NEGATIVE ng/mL (ref <100)
Cocaine Metabolite: NEGATIVE ng/mL (ref <150)
Codeine: NEGATIVE ng/mL (ref <50)
Creatinine: 155.8 mg/dL (ref >=20.0)
Hydrocodone: NEGATIVE ng/mL (ref <50)
Hydromorphone: >10000 ng/mL — ABNORMAL HIGH (ref <50)
Marijuana Metabolite: NEGATIVE ng/mL (ref <20)
Methadone Metabolite: NEGATIVE ng/mL (ref <100)
Morphine: NEGATIVE ng/mL (ref <50)
Norhydrocodone: NEGATIVE ng/mL (ref <50)
Opiates: POSITIVE ng/mL — AB (ref <100)
Oxidant: NEGATIVE ug/mL (ref <200)
Oxycodone: NEGATIVE ng/mL (ref <100)
Phencyclidine: NEGATIVE ng/mL (ref <25)
pH: 5.4 (ref 4.5–9.0)

## 2022-11-06 LAB — DM TEMPLATE

## 2022-11-06 LAB — HEMOGLOBIN A1C
Hgb A1c MFr Bld: 8.4 % of total Hgb — ABNORMAL HIGH (ref <5.7)
Mean Plasma Glucose: 194 mg/dL
eAG (mmol/L): 10.8 mmol/L

## 2022-11-09 ENCOUNTER — Telehealth: Payer: Self-pay

## 2022-11-10 ENCOUNTER — Other Ambulatory Visit: Payer: Self-pay

## 2022-11-10 ENCOUNTER — Observation Stay (HOSPITAL_COMMUNITY)
Admission: EM | Admit: 2022-11-10 | Discharge: 2022-11-12 | Disposition: A | Discharge status: Home / Self Care | Payer: Medicaid Other | Attending: Internal Medicine | Admitting: Internal Medicine

## 2022-11-10 ENCOUNTER — Emergency Department (HOSPITAL_COMMUNITY): Payer: Medicaid Other

## 2022-11-10 ENCOUNTER — Encounter (HOSPITAL_COMMUNITY): Payer: Self-pay

## 2022-11-10 DIAGNOSIS — K858 Other acute pancreatitis without necrosis or infection: Secondary | ICD-10-CM | POA: Diagnosis not present

## 2022-11-10 DIAGNOSIS — Z9689 Presence of other specified functional implants: Secondary | ICD-10-CM

## 2022-11-10 DIAGNOSIS — E109 Type 1 diabetes mellitus without complications: Secondary | ICD-10-CM | POA: Diagnosis not present

## 2022-11-10 DIAGNOSIS — K869 Disease of pancreas, unspecified: Secondary | ICD-10-CM | POA: Diagnosis present

## 2022-11-10 DIAGNOSIS — K859 Acute pancreatitis without necrosis or infection, unspecified: Secondary | ICD-10-CM | POA: Diagnosis present

## 2022-11-10 DIAGNOSIS — K219 Gastro-esophageal reflux disease without esophagitis: Secondary | ICD-10-CM | POA: Diagnosis not present

## 2022-11-10 DIAGNOSIS — R109 Unspecified abdominal pain: Secondary | ICD-10-CM | POA: Diagnosis present

## 2022-11-10 DIAGNOSIS — I82890 Acute embolism and thrombosis of other specified veins: Secondary | ICD-10-CM | POA: Insufficient documentation

## 2022-11-10 DIAGNOSIS — K8581 Other acute pancreatitis with uninfected necrosis: Principal | ICD-10-CM

## 2022-11-10 DIAGNOSIS — R1084 Generalized abdominal pain: Secondary | ICD-10-CM | POA: Diagnosis present

## 2022-11-10 DIAGNOSIS — K2100 Gastro-esophageal reflux disease with esophagitis, without bleeding: Secondary | ICD-10-CM | POA: Diagnosis present

## 2022-11-10 LAB — CBC WITH DIFFERENTIAL/PLATELET
Abs Immature Granulocytes: 0.02 10*3/uL (ref 0.00–0.07)
Basophils Absolute: 0 10*3/uL (ref 0.0–0.1)
Basophils Relative: 1 %
Eosinophils Absolute: 0.1 10*3/uL (ref 0.0–0.5)
Eosinophils Relative: 2 %
HCT: 44.3 % (ref 39.0–52.0)
Hemoglobin: 14.8 g/dL (ref 13.0–17.0)
Immature Granulocytes: 0 %
Lymphocytes Relative: 24 %
Lymphs Abs: 1.4 10*3/uL (ref 0.7–4.0)
MCH: 34.8 pg — ABNORMAL HIGH (ref 26.0–34.0)
MCHC: 33.4 g/dL (ref 30.0–36.0)
MCV: 104.2 fL — ABNORMAL HIGH (ref 80.0–100.0)
Monocytes Absolute: 0.6 10*3/uL (ref 0.1–1.0)
Monocytes Relative: 10 %
Neutro Abs: 3.6 10*3/uL (ref 1.7–7.7)
Neutrophils Relative %: 63 %
Platelets: 222 10*3/uL (ref 150–400)
RBC: 4.25 MIL/uL (ref 4.22–5.81)
RDW: 12 % (ref 11.5–15.5)
WBC: 5.7 10*3/uL (ref 4.0–10.5)
nRBC: 0 % (ref 0.0–0.2)

## 2022-11-10 LAB — COMPREHENSIVE METABOLIC PANEL
ALT: 19 U/L (ref 0–44)
AST: 21 U/L (ref 15–41)
Albumin: 4.2 g/dL (ref 3.5–5.0)
Alkaline Phosphatase: 80 U/L (ref 38–126)
Anion gap: 13 (ref 5–15)
BUN: 8 mg/dL (ref 6–20)
CO2: 19 mmol/L — ABNORMAL LOW (ref 22–32)
Calcium: 9.2 mg/dL (ref 8.9–10.3)
Chloride: 102 mmol/L (ref 98–111)
Creatinine, Ser: 0.81 mg/dL (ref 0.61–1.24)
GFR, Estimated: >60 mL/min (ref >60)
Glucose, Bld: 193 mg/dL — ABNORMAL HIGH (ref 70–99)
Potassium: 3.6 mmol/L (ref 3.5–5.1)
Sodium: 134 mmol/L — ABNORMAL LOW (ref 135–145)
Total Bilirubin: 1 mg/dL (ref 0.3–1.2)
Total Protein: 7.5 g/dL (ref 6.5–8.1)

## 2022-11-10 LAB — LIPASE, BLOOD: Lipase: 66 U/L — ABNORMAL HIGH (ref 11–51)

## 2022-11-10 LAB — CBG MONITORING, ED: Glucose-Capillary: 197 mg/dL — ABNORMAL HIGH (ref 70–99)

## 2022-11-10 MED ORDER — OXYCODONE HCL 5 MG PO TABS
5.0000 mg | ORAL_TABLET | Freq: Once | ORAL | Status: AC
  Administered 2022-11-11: 5 mg via ORAL
  Filled 2022-11-10: qty 1

## 2022-11-10 MED ORDER — IOHEXOL 300 MG/ML  SOLN
100.0000 mL | Freq: Once | INTRAMUSCULAR | Status: AC | PRN
  Administered 2022-11-10: 100 mL via INTRAVENOUS

## 2022-11-10 NOTE — ED Triage Notes (Signed)
Pt reports with upper abdominal pain since Jan 1. Pt states that he was lifting something at work and it caused his pancreatitis to flare up.

## 2022-11-10 NOTE — ED Provider Triage Note (Signed)
Emergency Medicine Provider Triage Evaluation Note  Carlos [name] , a 37 y.o. male  was evaluated in triage.  Pt complains of epigastric abdominal pain.  Patient reports that he had stent placed in pancreas in October for pancreatitis.  Patient reports he had pancreatitis due to heavy drinking.  Patient states he is followed by Dr. Rush Landmark of [facility].  Patient reports that he lifted something very heavy on the second of this month and has had increased epigastric abdominal pain since this time.  Patient reports that this pain is very different from his typical pain.  Patient is endorsing diarrhea, nausea however denies any vomiting or fevers.  Patient denies currently drinking.  Review of Systems  Positive:  Negative:   Physical Exam  BP (!) 134/98 (BP Location: Left Arm)   Pulse (!) 115   Temp 98.3 F (36.8 C) (Oral)   Resp 16   SpO2 100%  Gen:   Awake, no distress   Resp:  Normal effort  MSK:   Moves extremities without difficulty  Other:  Epigastric abdominal pain.  No overlying skin change.  Medical Decision Making  Medically screening exam initiated at 10:40 PM.  Appropriate orders placed.  Costella Hatcher was informed that the remainder of the evaluation will be completed by another provider, this initial triage assessment does not replace that evaluation, and the importance of remaining in the ED until their evaluation is complete.     Azucena Cecil, PA-C 11/10/22 2241

## 2022-11-11 ENCOUNTER — Other Ambulatory Visit: Payer: Self-pay | Admitting: Internal Medicine

## 2022-11-11 DIAGNOSIS — K8689 Other specified diseases of pancreas: Secondary | ICD-10-CM

## 2022-11-11 DIAGNOSIS — K859 Acute pancreatitis without necrosis or infection, unspecified: Secondary | ICD-10-CM | POA: Diagnosis not present

## 2022-11-11 DIAGNOSIS — E109 Type 1 diabetes mellitus without complications: Secondary | ICD-10-CM | POA: Insufficient documentation

## 2022-11-11 DIAGNOSIS — Z9889 Other specified postprocedural states: Secondary | ICD-10-CM | POA: Diagnosis not present

## 2022-11-11 DIAGNOSIS — K869 Disease of pancreas, unspecified: Secondary | ICD-10-CM | POA: Diagnosis not present

## 2022-11-11 LAB — BASIC METABOLIC PANEL
Anion gap: 10 (ref 5–15)
BUN: 10 mg/dL (ref 6–20)
CO2: 24 mmol/L (ref 22–32)
Calcium: 9.1 mg/dL (ref 8.9–10.3)
Chloride: 100 mmol/L (ref 98–111)
Creatinine, Ser: 0.83 mg/dL (ref 0.61–1.24)
GFR, Estimated: >60 mL/min (ref >60)
Glucose, Bld: 172 mg/dL — ABNORMAL HIGH (ref 70–99)
Potassium: 3.6 mmol/L (ref 3.5–5.1)
Sodium: 134 mmol/L — ABNORMAL LOW (ref 135–145)

## 2022-11-11 LAB — URINALYSIS, ROUTINE W REFLEX MICROSCOPIC
Bacteria, UA: NONE SEEN
Bilirubin Urine: NEGATIVE
Glucose, UA: >=500 mg/dL — AB
Hgb urine dipstick: NEGATIVE
Ketones, ur: 20 mg/dL — AB
Leukocytes,Ua: NEGATIVE
Nitrite: NEGATIVE
Protein, ur: NEGATIVE mg/dL
Specific Gravity, Urine: 1.043 — ABNORMAL HIGH (ref 1.005–1.030)
pH: 5 (ref 5.0–8.0)

## 2022-11-11 LAB — CBG MONITORING, ED
Glucose-Capillary: 370 mg/dL — ABNORMAL HIGH (ref 70–99)
Glucose-Capillary: 169 mg/dL — ABNORMAL HIGH (ref 70–99)
Glucose-Capillary: 151 mg/dL — ABNORMAL HIGH (ref 70–99)

## 2022-11-11 LAB — GLUCOSE, CAPILLARY: Glucose-Capillary: 123 mg/dL — ABNORMAL HIGH (ref 70–99)

## 2022-11-11 MED ORDER — INSULIN GLARGINE-YFGN 100 UNIT/ML ~~LOC~~ SOLN
12.0000 [IU] | Freq: Once | SUBCUTANEOUS | Status: AC
  Administered 2022-11-11: 12 [IU] via SUBCUTANEOUS
  Filled 2022-11-11: qty 0.12

## 2022-11-11 MED ORDER — ONDANSETRON HCL 4 MG/2ML IJ SOLN
4.0000 mg | Freq: Once | INTRAMUSCULAR | Status: AC
  Administered 2022-11-11: 4 mg via INTRAVENOUS
  Filled 2022-11-11: qty 2

## 2022-11-11 MED ORDER — LACTATED RINGERS IV BOLUS
1000.0000 mL | Freq: Once | INTRAVENOUS | Status: AC
  Administered 2022-11-11: 1000 mL via INTRAVENOUS

## 2022-11-11 MED ORDER — LACTATED RINGERS IV SOLN: INTRAVENOUS | Status: DC

## 2022-11-11 MED ORDER — HYDROMORPHONE HCL 1 MG/ML IJ SOLN
1.0000 mg | INTRAMUSCULAR | Status: DC | PRN
  Administered 2022-11-11 – 2022-11-12 (×5): 1 mg via INTRAVENOUS
  Filled 2022-11-11 (×5): qty 1

## 2022-11-11 MED ORDER — MORPHINE SULFATE (PF) 4 MG/ML IV SOLN
4.0000 mg | Freq: Once | INTRAVENOUS | Status: AC
  Administered 2022-11-11: 4 mg via INTRAVENOUS
  Filled 2022-11-11: qty 1

## 2022-11-11 MED ORDER — PROCHLORPERAZINE EDISYLATE 10 MG/2ML IJ SOLN: 10.0000 mg | Freq: Four times a day (QID) | INTRAMUSCULAR | Status: DC | PRN

## 2022-11-11 MED ORDER — DEXCOM G6 SENSOR MISC: 3 refills | Status: DC

## 2022-11-11 MED ORDER — PANTOPRAZOLE SODIUM 40 MG IV SOLR
40.0000 mg | INTRAVENOUS | Status: DC
  Administered 2022-11-11 – 2022-11-12 (×2): 40 mg via INTRAVENOUS
  Filled 2022-11-11 (×2): qty 10

## 2022-11-11 MED ORDER — INSULIN ASPART 100 UNIT/ML IJ SOLN
0.0000 [IU] | INTRAMUSCULAR | Status: DC
  Administered 2022-11-11: 9 [IU] via SUBCUTANEOUS
  Administered 2022-11-11 (×2): 2 [IU] via SUBCUTANEOUS
  Administered 2022-11-11 – 2022-11-12 (×2): 1 [IU] via SUBCUTANEOUS
  Administered 2022-11-12: 3 [IU] via SUBCUTANEOUS
  Administered 2022-11-12: 1 [IU] via SUBCUTANEOUS
  Filled 2022-11-11: qty 0.09

## 2022-11-11 MED ORDER — INSULIN GLARGINE-YFGN 100 UNIT/ML ~~LOC~~ SOLN
10.0000 [IU] | Freq: Every day | SUBCUTANEOUS | Status: DC
  Administered 2022-11-12: 10 [IU] via SUBCUTANEOUS
  Filled 2022-11-11: qty 0.1

## 2022-11-11 NOTE — Inpatient Diabetes Management (Signed)
Inpatient Diabetes Program Recommendations  AACE/ADA: New Consensus Statement on Inpatient Glycemic Control (2015)  Target Ranges:  Prepandial:   less than 140 mg/dL      Peak postprandial:   less than 180 mg/dL (1-2 hours)      Critically ill patients:  140 - 180 mg/dL   Lab Results  Component Value Date   GLUCAP 169 (H) 11/11/2022   HGBA1C 8.4 (H) 11/03/2022    If Patient is admitted, please consider:  Semgle 10 units QD starting 11/12/21 Novolog 0-9 units Q4H while NPO   If diet ordered- Novolog 0-9 units TID and 0-5 units QHS Novolog 3 units TID with meals if he consumes at least 50%  Will continue to follow while inpatient.  Thank you, Reche Dixon, MSN, [place] [facility] Coordinator Inpatient Diabetes Program [phone] (team pager from 8a-5p)

## 2022-11-11 NOTE — H&P (Addendum)
History and Physical    Patient: Carlos [name] YQM:[id number] DOB: 02-15-1986 DOA: 11/10/2022 DOS: the patient was seen and examined on 11/11/2022 PCP: [name], MD  Patient coming from: Home  Chief Complaint:  Chief Complaint  Patient presents with   Abdominal Pain   HPI: Carlos [name] is a 37 y.o. male with medical history significant of DM1, chronic pancreatitis, GERD, protal vein thrombosis. Presenting with abdominal pain. He was in his normal state of health until 3 days ago. He had a flare of his chronic LUQ abdominal pain. He says he was lifting some boxes when it became sharp and radiated across his stomach. He took his home pain meds and was able to rest that night; however, the next morning it was back. It persisted throughout the day. Yesterday it intensified and was no longer responsive to his home meds. He had N, but no V. He did not have any diarrhea or fevers. When his symptoms did not improve last night, he came to the ED for evaluation. He denies any other aggravating or alleviating factors.   Review of Systems: As mentioned in the history of present illness. All other systems reviewed and are negative. Past Medical History:  Diagnosis Date   AKI (acute kidney injury) ([place]) 12/21/2021   Anxiety    Diabetes ([place])    Pancreatitis    Splenic vein thrombosis    Past Surgical History:  Procedure Laterality Date   BIOPSY  12/02/2021   Procedure: BIOPSY;  Surgeon: Rush Landmark Telford Nab., MD;  Location: [facility];  Service: Gastroenterology;;   ENDOSCOPIC RETROGRADE CHOLANGIOPANCREATOGRAPHY (ERCP) WITH PROPOFOL N/A 08/12/2022   Procedure: ENDOSCOPIC RETROGRADE CHOLANGIOPANCREATOGRAPHY (ERCP) WITH PROPOFOL;  Surgeon: Irving Copas., MD;  Location: [facility];  Service: Gastroenterology;  Laterality: N/A;   ESOPHAGOGASTRODUODENOSCOPY (EGD) WITH PROPOFOL N/A 12/02/2021   Procedure: ESOPHAGOGASTRODUODENOSCOPY (EGD) WITH PROPOFOL;  Surgeon: Rush Landmark Telford Nab., MD;   Location: [facility];  Service: Gastroenterology;  Laterality: N/A;   NO PAST SURGERIES     PANCREATIC STENT PLACEMENT  08/12/2022   Procedure: PANCREATIC STENT PLACEMENT;  Surgeon: Irving Copas., MD;  Location: [facility];  Service: Gastroenterology;;   Joan Mayans  08/12/2022   Procedure: Joan Mayans;  Surgeon: Irving Copas., MD;  Location: [facility];  Service: Gastroenterology;;   UPPER ESOPHAGEAL ENDOSCOPIC ULTRASOUND (EUS) N/A 12/02/2021   Procedure: UPPER ESOPHAGEAL ENDOSCOPIC ULTRASOUND (EUS);  Surgeon: Irving Copas., MD;  Location: [facility];  Service: Gastroenterology;  Laterality: N/A;   Social History:  reports that he has been smoking cigars. He started smoking about 20 years ago. He has never used smokeless tobacco. He reports that he does not currently use alcohol. He reports that he does not use drugs.  Allergies  Allergen Reactions   Other Anaphylaxis    [name] - anaphylaxis    Family History  Problem Relation Age of Onset   Hypertension Mother    Alcoholism Maternal Uncle    Diabetes Maternal Grandmother    Diabetes Maternal Grandfather    Diabetes Paternal Grandmother    Diabetes Paternal Grandfather    Colon cancer Neg Hx    Esophageal cancer Neg Hx    Inflammatory bowel disease Neg Hx    Liver disease Neg Hx    Pancreatic cancer Neg Hx    Rectal cancer Neg Hx    Stomach cancer Neg Hx     Prior to Admission medications   Medication Sig Start Date End Date Taking? Authorizing Provider  Continuous Blood Gluc Sensor (  DEXCOM G6 SENSOR) MISC Change sensors every 10 days 11/11/22   [name], Melanie Crazier, MD  acetaminophen (TYLENOL) 325 MG tablet Take 2 tablets (650 mg total) by mouth every 6 (six) hours as needed for mild pain (or Fever >/= 101). 07/22/22   Samuella Cota, MD  apixaban (ELIQUIS) 5 MG TABS tablet Take 1 tablet (5 mg total) by mouth 2 (two) times daily. 09/22/22   [name], MD   diphenhydramine-acetaminophen (TYLENOL PM) 25-500 MG TABS tablet Take 2 tablets by mouth at bedtime as needed (sleep, pain).    [provider]  HYDROmorphone (DILAUDID) 4 MG tablet Take 1 tablet (4 mg total) by mouth every 4 (four) hours as needed for severe pain. 10/27/22   [name], MD  insulin aspart (NOVOLOG) 100 UNIT/ML injection Inject 120-150 Units into the skin See admin instructions. 120-150 units into insulin pump every 3 days    [provider]  Insulin Disposable Pump (OMNIPOD 5 G6 INTRO, GEN 5,) KIT 1 Device by Does not apply route every 3 (three) days. 03/10/22   [name], Melanie Crazier, MD  lipase/protease/amylase (CREON) 36000 UNITS CPEP capsule Take 2 capsules (72,000 Units total) by mouth 3 (three) times daily before meals. 09/22/22   [name], Telford Nab., MD  Multiple Vitamins-Minerals (ADVANCED DIABETIC MULTIVITAMIN PO) Take 1 tablet by mouth daily.    [provider]  omeprazole (PRILOSEC) 40 MG capsule Take 1 capsule (40 mg total) by mouth daily. 05/27/22   [name], Dinah C, NP  ondansetron (ZOFRAN) 8 MG tablet Take 1 tablet (8 mg total) by mouth every 8 (eight) hours as needed for nausea or vomiting. 04/23/22   Ezequiel Essex, MD    Physical Exam: Vitals:   11/10/22 2234 11/10/22 2340 11/11/22 0146 11/11/22 0824  BP: (!) 134/98  (!) 150/90 (!) 150/97  Pulse: (!) 115  (!) 108 99  Resp: _0 Temp: 98.3 F (36.8 C)  98.8 F (37.1 C) 98.6 F (37 C)  TempSrc: Oral  Oral Oral  SpO2: 100%  100% 98%  Weight:  74.8 kg    Height:  _1  (1.956 m)     General: 37 y.o. male resting in bed in NAD Eyes: PERRL, normal sclera ENMT: Nares patent w/o discharge, orophaynx clear, dentition normal, ears w/o discharge/lesions/ulcers Neck: Supple, trachea midline Cardiovascular: RRR, +S1, S2, no m/g/r, equal pulses throughout Respiratory: CTABL, no w/r/r, normal WOB GI: BS+, ND, mild TTP LUQ, no masses noted, no organomegaly noted MSK:  No e/c/c Neuro: A&O x 3, no focal deficits Psyc: Appropriate interaction and affect, calm/cooperative  Data Reviewed:  Results for orders placed or performed during the hospital encounter of 11/10/22 (from the past 24 hour(s))  CBG monitoring, ED     Status: Abnormal   Collection Time: 11/10/22 11:02 PM  Result Value Ref Range   Glucose-Capillary 197 (H) 70 - 99 mg/dL  CBC with Differential     Status: Abnormal   Collection Time: 11/10/22 11:03 PM  Result Value Ref Range   WBC 5.7 4.0 - 10.5 K/uL   RBC 4.25 4.22 - 5.81 MIL/uL   Hemoglobin 14.8 13.0 - 17.0 g/dL   HCT 44.3 39.0 - 52.0 %   MCV 104.2 (H) 80.0 - 100.0 fL   MCH 34.8 (H) 26.0 - 34.0 pg   MCHC 33.4 30.0 - 36.0 g/dL   RDW 12.0 11.5 - 15.5 %   Platelets 222 150 - 400 K/uL   nRBC 0.0 0.0 -  0.2 %   Neutrophils Relative % 63 %   Neutro Abs 3.6 1.7 - 7.7 K/uL   Lymphocytes Relative 24 %   Lymphs Abs 1.4 0.7 - 4.0 K/uL   Monocytes Relative 10 %   Monocytes Absolute 0.6 0.1 - 1.0 K/uL   Eosinophils Relative 2 %   Eosinophils Absolute 0.1 0.0 - 0.5 K/uL   Basophils Relative 1 %   Basophils Absolute 0.0 0.0 - 0.1 K/uL   Immature Granulocytes 0 %   Abs Immature Granulocytes 0.02 0.00 - 0.07 K/uL  Comprehensive metabolic panel     Status: Abnormal   Collection Time: 11/10/22 11:03 PM  Result Value Ref Range   Sodium 134 (L) 135 - 145 mmol/L   Potassium 3.6 3.5 - 5.1 mmol/L   Chloride 102 98 - 111 mmol/L   CO2 19 (L) 22 - 32 mmol/L   Glucose, Bld 193 (H) 70 - 99 mg/dL   BUN 8 6 - 20 mg/dL   Creatinine, Ser 0.81 0.61 - 1.24 mg/dL   Calcium 9.2 8.9 - 10.3 mg/dL   Total Protein 7.5 6.5 - 8.1 g/dL   Albumin 4.2 3.5 - 5.0 g/dL   AST 21 15 - 41 U/L   ALT 19 0 - 44 U/L   Alkaline Phosphatase 80 38 - 126 U/L   Total Bilirubin 1.0 0.3 - 1.2 mg/dL   GFR, Estimated >60 >60 mL/min   Anion gap 13 5 - 15  Lipase, blood     Status: Abnormal   Collection Time: 11/10/22 11:03 PM  Result Value Ref Range   Lipase 66 (H) 11 - 51 U/L   CBG monitoring, ED     Status: Abnormal   Collection Time: 11/11/22  8:41 AM  Result Value Ref Range   Glucose-Capillary 370 (H) 70 - 99 mg/dL  Urinalysis, Routine w reflex microscopic     Status: Abnormal   Collection Time: 11/11/22 10:20 AM  Result Value Ref Range   Color, Urine YELLOW YELLOW   APPearance CLEAR CLEAR   Specific Gravity, Urine 1.043 (H) 1.005 - 1.030   pH 5.0 5.0 - 8.0   Glucose, UA >=500 (A) NEGATIVE mg/dL   Hgb urine dipstick NEGATIVE NEGATIVE   Bilirubin Urine NEGATIVE NEGATIVE   Ketones, ur 20 (A) NEGATIVE mg/dL   Protein, ur NEGATIVE NEGATIVE mg/dL   Nitrite NEGATIVE NEGATIVE   Leukocytes,Ua NEGATIVE NEGATIVE   RBC / HPF 0-5 0 - 5 RBC/hpf   WBC, UA 0-5 0 - 5 WBC/hpf   Bacteria, UA NONE SEEN NONE SEEN   Squamous Epithelial / HPF 0-5 0 - 5 /HPF   CT ab/pelvis w/ 1. Findings consistent with mild acute on chronic pancreatitis. 2. Mild interval increase in size of the area of parenchymal low attenuation within the tail of the pancreas which may represent a small area of pancreatic necrosis. MRI follow-up is recommended to exclude the presence of an underlying neoplastic process. 3. Stable pancreatic ductal stent positioning. 4. Sigmoid diverticulosis.  Assessment and Plan: Acute on chronic pancreatitis     - admit to inpt, med-surg     - continue fluid, pain control     - NPO for now     - [facility] to see, appreciate assistance; further imaging per them  DMt1 w/ hyperglycemia     - rpt labs do not show acidosis     - he was given semglee and SSI this morning; will continue for now  Hx of splenic vein thrombosis     - continue home regimen  GERD     - PPI  Advance Care Planning:   Code Status: FULL  Consults: [facility]  Family Communication: None at bedside  Severity of Illness: The appropriate patient status for this patient is INPATIENT. Inpatient status is judged to be reasonable and necessary in order to provide the required intensity of  service to ensure the patient's safety. The patient's presenting symptoms, physical exam findings, and initial radiographic and laboratory data in the context of their chronic comorbidities is felt to place them at high risk for further clinical deterioration. Furthermore, it is not anticipated that the patient will be medically stable for discharge from the hospital within 2 midnights of admission.   * I certify that at the point of admission it is my clinical judgment that the patient will require inpatient hospital care spanning beyond 2 midnights from the point of admission due to high intensity of service, high risk for further deterioration and high frequency of surveillance required.*  Author: Jonnie Finner, DO 11/11/2022 10:57 AM  For on call review [email or web address].

## 2022-11-11 NOTE — Inpatient Diabetes Management (Signed)
Inpatient Diabetes Program Recommendations  AACE/ADA: New Consensus Statement on Inpatient Glycemic Control (2015)  Target Ranges:  Prepandial:   less than 140 mg/dL      Peak postprandial:   less than 180 mg/dL (1-2 hours)      Critically ill patients:  140 - 180 mg/dL   Lab Results  Component Value Date   GLUCAP 370 (H) 11/11/2022   HGBA1C 8.4 (H) 11/03/2022    Review of Glycemic Control  Latest Reference Range & Units 11/10/22 23:03 11/11/22 08:41  Glucose-Capillary 70 - 99 mg/dL  370 (H)  CO2 22 - 32 mmol/L 19 (L)   Anion gap 5 - 15  13   (H): Data is abnormally high (L): Data is abnormally low  Diabetes history: DM1(does not make insulin.  Needs correction, basal and meal coverage)  Outpatient Diabetes medications:  Omnipod insulin pump with Dexcom G7 Basal: Pt receives 12 units of basal insulin within a 24 hour period Target 130 Carb ratio: 1 units of Novolog for every 12 grams of carbohydrates Sensitivity: 1 units of insulin drops glucose 50 points Duration of insulin 4 hours  Current orders for Inpatient glycemic control: None  Inpatient Diabetes Program Recommendations:    Semglee 12 units now Novolog 0-9 units Q4H Please also order a bmet and beta-hydroxybutyrate to ensure he is not acidotic.     Spoke with patient in waiting area of ED.  Last Blood glucose was 370 mg/dL this morning at 0841.  He states he took his insulin pump and CGM off last night at 830pm because he knew he was going to have a CT scan.  He has been in the ED for 12 hrs now with no insulin.  Reported to RN and Rex Kras, PA that his CO2 was 19 at 2300 last night and his glucose is now 370 mg/dL.  Rex Kras, PA ordered [facility] and sensitive correction scale.  Notified patient of plan for SQ insulin.    Will continue to follow while inpatient.  Thank you, Reche Dixon, MSN, [place] [facility] Coordinator Inpatient Diabetes Program [phone] (team pager from 8a-5p)

## 2022-11-11 NOTE — Consult Note (Signed)
Consultation Note   Referring Provider:  [facility] PCP: Carlos Honour, MD Primary Gastroenterologist: Carlos Britain, MD          Reason for consultation: pancreatitis   Hospital Day: 2  Assessment    Patient profile:  Carlos [name] is a 37 y.o. 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 See PMH for any additional medical problems.   # Acute on Etoh chronic pancreatitis. He has a PD stricture, s/p stent placement in October. Due for stent exchange soon. In fact he is scheduled for stent exchange and EUS with celiac plexus block on 12/01/21. Came to ED for worsening pain.CT scan showing acute on chronic pancreatitis and also a mild interval increase in size of an area of parenchymal low attenuation in tale of the pancreas which could be pancreatic necrosis.   # Type I DM  # GERD  # See PMH for additional medical problems  Plan  Can have full liquids today. Will evaluate tomorrow and if improving then will probably advance to full liquids.  Continue home creon, 2 with meals and 1 with snacks Analgesics IV fluids It is possible that we could possibly perform ERCP with stent exchange on Saturday ( no availability beforehand). He will still need to keep his EUS appt on the 1/24. If he is feeling better tomorrow then we may possibly hold off on stent exchange until time of EUS on 1/24.  Regarding pancreatic tail parenchymal abnormality on CT scan, neoplasm seems unlikely as he has been imaged many times over the last few years. However, will likely need MRI to evaluate ( after acute inflammation resolves).   HPI   Patient was last seen in the office  10/19/2022 for follow-up on pancreatitis/PD stricture.  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  He is being followed by Dr. Rush [name] and underwent pancreatic duct stenting in October which was complicated by post ERCP pancreatitis.  At the time of his last visit 10/19/2022 he was overall improved.  He was due for a PD stent change.  Plan was for endoscopic ultrasound for celiac axis block and ERCP on 1/24. Creon continued.  Advised to avoid alcohol.  Interval history:  Carlos [name] presented to the emergency room yesterday for evaluation of worsening upper abdominal pain. He had lifted something heavy at work on 1/1 and his chronic upper abdominal pain escalated from there. The pain radiates around his right side. He has nausea but attributes that to the pain. The pain is not related to eating. He follows a limited diet and between that and creon he is able to eat     ED vital: temp 99.2, HR 99, BP 133/90  Significant studies this admission  Labs:  WBC 5.7 Hgb 14.8  BUN 18 Cr 0.8 Lipase 66 Tbili 1.0 ALk phos  80  Brief summary of imaging ( see full reports below) CTAP  - pancreatic calcifications. PD in stable position.  Mild acute on chronic pancreatitis. Possible small area of pancreatic necrosis.   Previous GI Evaluation    10/23 ERCP - Previous endoscopic evaluation suggested significant gastritis which was not present on today's exam with the ERCP scope. - The major papilla appeared to be small. - Difficult cannulation initially but with maneuvers and transition to revolution Jagtome we were able to access the pancreatic tree. - An irregularity was found in the ventral pancreatic duct within the head/genu of the pancreas. With stricturing noted in the genu of the pancreas. - A pancreatic sphincterotomy was performed. - One plastic pancreatic stent was placed into the  ventral pancreatic duct and traversed the strictured area.   Recent Labs and Imaging CT Abdomen Pelvis W Contrast  Result Date: 11/11/2022 CLINICAL DATA:  Epigastric abdominal pain. EXAM: CT ABDOMEN AND PELVIS WITH CONTRAST TECHNIQUE: Multidetector CT imaging of the abdomen and pelvis was performed using the standard protocol following bolus administration of intravenous contrast. RADIATION DOSE REDUCTION: This exam was performed according to the departmental dose-optimization program which includes automated exposure control, adjustment of the mA and/or kV according to patient size and/or use of iterative reconstruction technique. CONTRAST:  115m OMNIPAQUE IOHEXOL 300 MG/ML  SOLN COMPARISON:  October 12, 2022 FINDINGS: Lower chest: No acute abnormality. Hepatobiliary: A stable 12 mm diameter focus of parenchymal low attenuation is seen within the medial aspect of the right lobe of the liver (axial CT image 14, CT series 2). No gallstones, gallbladder wall thickening, or biliary dilatation. Pancreas: Subcentimeter coarse parenchymal calcifications are again seen throughout the pancreas. A pancreatic ductal stent is seen which is stable in positioning. A 2.2 cm x 1.8 cm x 2.6 cm area of parenchymal low attenuation is again seen within the tail of the pancreas (axial CT image 20, CT series 2/coronal reformatted image 65, CT series 5). This is mildly increased in size when compared to the prior exam. Mild peripancreatic inflammatory fat stranding is seen along the tail of the pancreas with stable peripancreatic fluid (approximately 30.54 Hounsfield units) noted along the anterior aspect of the pancreatic body (axial CT images 27 through 33, CT series 2). Spleen: Normal in size without focal abnormality. Adrenals/Urinary Tract: Adrenal glands are unremarkable. Kidneys are normal, without renal calculi, focal lesion, or hydronephrosis. Bladder is unremarkable. Stomach/Bowel: Stomach is within normal limits.  Appendix appears normal. No evidence of bowel wall thickening, distention, or inflammatory changes. Noninflamed diverticula are seen throughout the sigmoid colon. Vascular/Lymphatic: No significant vascular findings are present. No enlarged abdominal or pelvic lymph nodes. Reproductive: Prostate is unremarkable. Other: No abdominal wall hernia or abnormality. No abdominopelvic ascites. Musculoskeletal: No acute or significant osseous findings. IMPRESSION: 1. Findings consistent with mild acute on chronic pancreatitis. 2. Mild interval increase in size of the area of parenchymal low attenuation within the tail of the pancreas which may represent a small area of pancreatic necrosis. MRI follow-up is recommended to exclude the presence of an underlying neoplastic process. 3. Stable pancreatic ductal stent positioning. 4. Sigmoid diverticulosis. Electronically Signed   By: TVirgina NorfolkM.D.   On: 11/11/2022 00:26   CT ABDOMEN W WO CONTRAST  Result Date: 10/13/2022 CLINICAL DATA:  History of pancreatitis and pseudocyst EXAM: CT ABDOMEN WITHOUT AND WITH CONTRAST TECHNIQUE: Multidetector CT imaging of the abdomen was performed following the standard protocol before and following the bolus administration of intravenous contrast. RADIATION DOSE REDUCTION: This exam was performed according to the departmental dose-optimization program which includes automated exposure control, adjustment of the mA  and/or kV according to patient size and/or use of iterative reconstruction technique. CONTRAST:  125m OMNIPAQUE IOHEXOL 300 MG/ML  SOLN COMPARISON:  CT abdomen and pelvis dated 08/13/2022, 07/19/2022, 04/23/2022, MR abdomen dated 04/28/2022 FINDINGS: Lower chest: No focal consolidation or pulmonary nodule in the lung bases. No pleural effusion or pneumothorax demonstrated. Partially imaged heart size is normal. Hepatobiliary: 16 mm hypoattenuating focus within peripheral segment 2/3 (7:35), not definitely present on  08/13/2022. No intra or extrahepatic biliary ductal dilation. Normal gallbladder. Pancreas: Coarse calcifications throughout the pancreas in keeping with sequela of chronic pancreatitis. Similar configuration of the trans ampullary pancreatic ductal stent. There is diffusely heterogeneous hypo enhancement of the pancreas with hypoattenuating focus in the pancreatic tail measuring 11 mm (7:30), similar to 08/13/2022 but slowly decreasing in size since 04/23/2022, which may represent cystic lesion versus focal ductal dilation. Ill-defined fluid and stranding surrounding the pancreatic head is similar in appearance. No discrete walled-off collection. Spleen: Normal in size without focal abnormality. Adrenals/Urinary Tract: No adrenal nodules. No suspicious renal masses, calculi or hydronephrosis. Stomach/Bowel: Normal appearance of the stomach. No evidence of bowel wall thickening, distention, or inflammatory changes. Vascular/Lymphatic: Noncalcified aortic atherosclerosis. 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 No enlarged abdominal lymph nodes. Other: No free fluid, fluid collection, or free air. Musculoskeletal: No acute or abnormal lytic or blastic osseous lesions. IMPRESSION: 1. Similar appearance of acute on chronic pancreatitis with ill-defined fluid and stranding surrounding the pancreatic head. No discrete walled-off collection. 2. Similar configuration of the trans ampullary pancreatic ductal stent. 3. New 16 mm hypoattenuating focus within peripheral segment 2/3 of the liver, not definitely present on 08/13/2022. Given adjacent pancreatitis, this finding may represent infection/inflammation. MRI of the liver can be considered for further evaluation. 4. Aortic Atherosclerosis (ICD10-I70.0). Electronically Signed   By: LDarrin NipperM.D.   On: 10/13/2022 17:05    Labs:  Recent Labs    11/10/22 2303  WBC 5.7  HGB 14.8  HCT 44.3  PLT 222   Recent Labs    11/10/22 2303  NA 134*  K 3.6   CL 102  CO2 19*  GLUCOSE 193*  BUN 8  CREATININE 0.81  CALCIUM 9.2   Recent Labs    11/10/22 2303  PROT 7.5  ALBUMIN 4.2  AST 21  ALT 19  ALKPHOS 80  BILITOT 1.0   No results for input(s): "HEPBSAG", "HCVAB", "HEPAIGM", "HEPBIGM" in the last 72 hours. No results for input(s): "LABPROT", "INR" in the last 72 hours.  Past Medical History:  Diagnosis Date   [place]   Anxiety    Diabetes ([place]    Pancreatitis    Splenic vein thrombosis     Past Surgical History:  Procedure Laterality Date   BIOPSY  12/02/2021   Procedure: BIOPSY;  Surgeon: MRush LandmarkGTelford Nab, MD;  Location: [facility];  Service: Gastroenterology;;   ENDOSCOPIC RETROGRADE CHOLANGIOPANCREATOGRAPHY (ERCP) WITH PROPOFOL N/A 08/12/2022   Procedure: ENDOSCOPIC RETROGRADE CHOLANGIOPANCREATOGRAPHY (ERCP) WITH PROPOFOL;  Surgeon: MIrving Copas, MD;  [facility];  Service: Gastroenterology;  Laterality: N/A;   ESOPHAGOGASTRODUODENOSCOPY (EGD) WITH PROPOFOL N/A 12/02/2021   Procedure: ESOPHAGOGASTRODUODENOSCOPY (EGD) WITH PROPOFOL;  Surgeon: MRush LandmarkGTelford Nab, MD;  Location: [facility];  Service: Gastroenterology;  Laterality: N/A;   NO PAST SURGERIES     PANCREATIC STENT PLACEMENT  08/12/2022   Procedure: PANCREATIC STENT PLACEMENT;  Surgeon: MIrving Copas, MD;  [facility];  Service: Gastroenterology;;   SJoan May[place] 08/12/2022  Procedure: SPHINCTEROTOMY;  Surgeon: Carlos [name] Telford Nab., MD;  [facility];  Service: Gastroenterology;;   UPPER ESOPHAGEAL ENDOSCOPIC ULTRASOUND (EUS) N/A 12/02/2021   Procedure: UPPER ESOPHAGEAL ENDOSCOPIC ULTRASOUND (EUS);  Surgeon: Irving Copas., MD;  [facility];  Service: Gastroenterology;  Laterality: N/A;    Family History  Problem Relation Age of Onset   Hypertension Mother    Alcoholism Maternal Uncle    Diabetes Maternal Grandmother    Diabetes  Maternal Grandfather    Diabetes Paternal Grandmother    Diabetes Paternal Grandfather    Colon cancer Neg Hx    Esophageal cancer Neg Hx    Inflammatory bowel disease Neg Hx    Liver disease Neg Hx    Pancreatic cancer Neg Hx    Rectal cancer Neg Hx    Stomach cancer Neg Hx     Prior to Admission medications   Medication Sig Start Date End Date Taking? Authorizing Provider  Continuous Blood Gluc Sensor (DEXCOM G6 SENSOR) MISC Change sensors every 10 days 11/11/22   [name], Melanie Crazier, MD  acetaminophen (TYLENOL) 325 MG tablet Take 2 tablets (650 mg total) by mouth every 6 (six) hours as needed for mild pain (or Fever >/= 101). 07/22/22   Samuella Cota, MD  apixaban (ELIQUIS) 5 MG TABS tablet Take 1 tablet (5 mg total) by mouth 2 (two) times daily. 09/22/22   Carlos Honour, MD  diphenhydramine-acetaminophen (TYLENOL PM) 25-500 MG TABS tablet Take 2 tablets by mouth at bedtime as needed (sleep, pain).    [provider]  HYDROmorphone (DILAUDID) 4 MG tablet Take 1 tablet (4 mg total) by mouth every 4 (four) hours as needed for severe pain. 10/27/22   Carlos Honour, MD  insulin aspart (NOVOLOG) 100 UNIT/ML injection Inject 120-150 Units into the skin See admin instructions. 120-150 units into insulin pump every 3 days    [provider]  Insulin Disposable Pump (OMNIPOD 5 G6 INTRO, GEN 5,) KIT 1 Device by Does not apply route every 3 (three) days. 03/10/22   [name], Melanie Crazier, MD  lipase/protease/amylase (CREON) 36000 UNITS CPEP capsule Take 2 capsules (72,000 Units total) by mouth 3 (three) times daily before meals. 09/22/22   [name], Telford Nab., MD  Multiple Vitamins-Minerals (ADVANCED DIABETIC MULTIVITAMIN PO) Take 1 tablet by mouth daily.    [provider]  omeprazole (PRILOSEC) 40 MG capsule Take 1 capsule (40 mg total) by mouth daily. 05/27/22   [name], Nelda Bucks, NP  ondansetron (ZOFRAN) 8 MG tablet Take 1 tablet (8 mg total) by  mouth every 8 (eight) hours as needed for nausea or vomiting. 04/23/22   Ezequiel Essex, MD    Current Facility-Administered Medications  Medication Dose Route Frequency Provider Last Rate Last Admin   insulin aspart (novoLOG) injection 0-9 Units  0-9 Units Subcutaneous Q4H Rex Kras, PA   2 Units at 11/11/22 1210   pantoprazole (PROTONIX) injection 40 mg  40 mg Intravenous Q24H Cherylann Ratel A, DO       Current Outpatient Medications  Medication Sig Dispense Refill   Continuous Blood Gluc Sensor (DEXCOM G6 SENSOR) MISC Change sensors every 10 days 3 each 3   acetaminophen (TYLENOL) 325 MG tablet Take 2 tablets (650 mg total) by mouth every 6 (six) hours as needed for mild pain (or Fever >/= 101).     apixaban (ELIQUIS) 5 MG TABS tablet Take 1 tablet (5 mg total) by mouth 2 (two) times daily. 60 tablet  diphenhydramine-acetaminophen (TYLENOL PM) 25-500 MG TABS tablet Take 2 tablets by mouth at bedtime as needed (sleep, pain).     HYDROmorphone (DILAUDID) 4 MG tablet Take 1 tablet (4 mg total) by mouth every 4 (four) hours as needed for severe pain. 180 tablet 0   insulin aspart (NOVOLOG) 100 UNIT/ML injection Inject 120-150 Units into the skin See admin instructions. 120-150 units into insulin pump every 3 days     Insulin Disposable Pump (OMNIPOD 5 G6 INTRO, GEN 5,) KIT 1 Device by Does not apply route every 3 (three) days. 1 kit 0   lipase/protease/amylase (CREON) 36000 UNITS CPEP capsule Take 2 capsules (72,000 Units total) by mouth 3 (three) times daily before meals. 240 capsule 3   Multiple Vitamins-Minerals (ADVANCED DIABETIC MULTIVITAMIN PO) Take 1 tablet by mouth daily.     omeprazole (PRILOSEC) 40 MG capsule Take 1 capsule (40 mg total) by mouth daily. 90 capsule 1   ondansetron (ZOFRAN) 8 MG tablet Take 1 tablet (8 mg total) by mouth every 8 (eight) hours as needed for nausea or vomiting. 30 tablet 0    Allergies as of 11/10/2022 - Review Complete 11/10/2022  Allergen Reaction Noted    Other Anaphylaxis 09/26/2020    Social History   Socioeconomic History   Marital status: Single    Spouse name: Not on file   Number of children: 6   Years of education: Not on file   Highest education level: Not on file  Occupational History   Occupation: forklift driver  Tobacco Use   Smoking status: Some Days    Types: Cigars    Start date: 11/08/2002   Smokeless tobacco: Never   Tobacco comments:    Once a week.  Vaping Use   Vaping Use: Never used  Substance and Sexual Activity   Alcohol use: Not Currently   Drug use: Never   Sexual activity: Not on file  Other Topics Concern   Not on file  Social History Narrative   Not on file   Social Determinants of Health   Financial Resource Strain: Not on file  Food Insecurity: No Food Insecurity (08/14/2022)   Hunger Vital Sign    Worried About Running Out of Food in the Last Year: Never true    Ran Out of Food in the Last Year: Never true  Transportation Needs: No Transportation Needs (08/14/2022)   PRAPARE - Hydrologist (Medical): No    Lack of Transportation (Non-Medical): No  Physical Activity: Not on file  Stress: Not on file  Social Connections: Not on file  Intimate Partner Violence: Not At Risk (08/14/2022)   Humiliation, Afraid, Rape, and Kick questionnaire    Fear of Current or Ex-Partner: No    Emotionally Abused: No    Physically Abused: No    Sexually Abused: No    Review of Systems: All systems reviewed and negative except where noted in HPI.  Physical Exam: Vital signs in last 24 hours: Temp:  [98.3 F (36.8 C)-99.2 F (37.3 C)] 99.2 F (37.3 C) (01/04 1212) Pulse Rate:  [97-115] 97 (01/04 1212) Resp:  [15-17] 17 (01/04 1212) BP: (133-150)/(90-98) 133/90 (01/04 1212) SpO2:  [98 %-100 %] 98 % (01/04 1212) Weight:  [74.8 kg] 74.8 kg (01/03 2340)    General:  Alert thin male in NAD Psych:  Pleasant, cooperative. Normal mood and affect Eyes: Pupils equal Ears:   Normal auditory acuity Nose: No deformity, discharge or lesions Neck:  Supple, no  masses felt Lungs:  Clear to auscultation.  Heart:  Regular rate, regular rhythm.  Abdomen:  Soft, nondistended, mild mid upper abdominal tenderness. Bowel sounds active, no masses felt Rectal :  Deferred Msk: Symmetrical without gross deformities.  Neurologic:  Alert, oriented, grossly normal neurologically Extremities : No edema Skin:  Intact without significant lesions.    Intake/Output from previous day: No intake/output data recorded. Intake/Output this shift:  Total I/O In: 1000 [IV Piggyback:1000] Out: -    Active Problems:   * No active hospital problems. Tye Savoy, NP-C @  11/11/2022, 12:15 PM

## 2022-11-11 NOTE — ED Provider Notes (Signed)
Emergency Department Provider Note   I have reviewed the triage vital signs and the nursing notes.   HISTORY  Chief Complaint Abdominal Pain   HPI Carlos [name] is a 37 y.o. 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  He states it feels similar to his pancreatitis but is more on the right side at this point.  No fevers or chills.  Symptoms began 3 days prior.  He states initially he was lifting something and feels like it may have dislodged his pancreatic stent causing him to have recurrent pancreatitis.  He has had nausea and vomiting along with worsening pain.  Has been trying to treat at home without relief.  Past Medical History:  Diagnosis Date   AKI (acute kidney injury) ([place]) 12/21/2021   Anxiety    Diabetes ([place])    Pancreatitis    Splenic vein thrombosis     Review of Systems  Constitutional: No fever/chills Eyes: No visual changes. ENT: No sore throat. Cardiovascular: Denies chest pain. Respiratory: Denies shortness of breath. Gastrointestinal: Positive abdominal pain. Positive nausea and vomiting.  No diarrhea.  No constipation. Genitourinary: Negative for dysuria. Musculoskeletal: Negative for back pain. Skin: Negative for rash. Neurological: Negative for headaches, focal weakness or numbness.   ____________________________________________   PHYSICAL EXAM:  VITAL SIGNS: ED Triage Vitals  Enc Vitals Group     BP 11/10/22 2234 (!) 134/98     Pulse Rate 11/10/22 2234 (!) 115     Resp 11/10/22 2234 16     Temp 11/10/22 2234 98.3 F (36.8 C)     Temp Source 11/10/22 2234 Oral     SpO2 11/10/22 2234 100 %     Weight 11/10/22 2340 164 lb 14.5 oz (74.8 kg)     Height 11/10/22 2340 6\' 5"  (1.956 m)    Constitutional: Alert and oriented. Well appearing and in no acute distress. Eyes: Conjunctivae are normal.  Head:  Atraumatic. Nose: No congestion/rhinnorhea. Mouth/Throat: Mucous membranes are moist.   Neck: No stridor.   Cardiovascular: Normal rate, regular rhythm. Good peripheral circulation. Grossly normal heart sounds.   Respiratory: Normal respiratory effort.  No retractions. Lungs CTAB. Gastrointestinal: Soft with tenderness in the epigastric region. No distention.  Musculoskeletal: No lower extremity tenderness nor edema. No gross deformities of extremities. Neurologic:  Normal speech and language. No gross focal neurologic deficits are appreciated.  Skin:  Skin is warm, dry and intact. No rash noted. ____________________________________________   LABS (all labs ordered are listed, but only abnormal results are displayed)  Labs Reviewed  CBC WITH DIFFERENTIAL/PLATELET - Abnormal; Notable for the following components:      Result Value   MCV 104.2 (*)    MCH 34.8 (*)    All other components within normal limits  COMPREHENSIVE METABOLIC PANEL - Abnormal; Notable for the following components:   Sodium 134 (*)    CO2 19 (*)    Glucose, Bld 193 (*)    All other components within normal limits  LIPASE, BLOOD - Abnormal; Notable for the following components:   Lipase 66 (*)    All other components within normal limits  URINALYSIS, ROUTINE W REFLEX MICROSCOPIC - Abnormal; Notable for the following components:   Specific Gravity, Urine 1.043 (*)    Glucose, UA >=500 (*)    Ketones, ur 20 (*)    All other components within normal limits  CBG  MONITORING, ED - Abnormal; Notable for the following components:   Glucose-Capillary 197 (*)    All other components within normal limits  CBG MONITORING, ED - Abnormal; Notable for the following components:   Glucose-Capillary 370 (*)    All other components within normal limits  CBG MONITORING, ED - Abnormal; Notable for the following components:   Glucose-Capillary 169 (*)    All other components within normal limits  BASIC METABOLIC PANEL    ____________________________________________  RADIOLOGY  CT Abdomen Pelvis W Contrast  Result Date: 11/11/2022 CLINICAL DATA:  Epigastric abdominal pain. EXAM: CT ABDOMEN AND PELVIS WITH CONTRAST TECHNIQUE: Multidetector CT imaging of the abdomen and pelvis was performed using the standard protocol following bolus administration of intravenous contrast. RADIATION DOSE REDUCTION: This exam was performed according to the departmental dose-optimization program which includes automated exposure control, adjustment of the mA and/or kV according to patient size and/or use of iterative reconstruction technique. CONTRAST:  118mL OMNIPAQUE IOHEXOL 300 MG/ML  SOLN COMPARISON:  October 12, 2022 FINDINGS: Lower chest: No acute abnormality. Hepatobiliary: A stable 12 mm diameter focus of parenchymal low attenuation is seen within the medial aspect of the right lobe of the liver (axial CT image 14, CT series 2). No gallstones, gallbladder wall thickening, or biliary dilatation. Pancreas: Subcentimeter coarse parenchymal calcifications are again seen throughout the pancreas. A pancreatic ductal stent is seen which is stable in positioning. A 2.2 cm x 1.8 cm x 2.6 cm area of parenchymal low attenuation is again seen within the tail of the pancreas (axial CT image 20, CT series 2/coronal reformatted image 65, CT series 5). This is mildly increased in size when compared to the prior exam. Mild peripancreatic inflammatory fat stranding is seen along the tail of the pancreas with stable peripancreatic fluid (approximately 30.54 Hounsfield units) noted along the anterior aspect of the pancreatic body (axial CT images 27 through 33, CT series 2). Spleen: Normal in size without focal abnormality. Adrenals/Urinary Tract: Adrenal glands are unremarkable. Kidneys are normal, without renal calculi, focal lesion, or hydronephrosis. Bladder is unremarkable. Stomach/Bowel: Stomach is within normal limits. Appendix appears normal. No  evidence of bowel wall thickening, distention, or inflammatory changes. Noninflamed diverticula are seen throughout the sigmoid colon. Vascular/Lymphatic: No significant vascular findings are present. No enlarged abdominal or pelvic lymph nodes. Reproductive: Prostate is unremarkable. Other: No abdominal wall hernia or abnormality. No abdominopelvic ascites. Musculoskeletal: No acute or significant osseous findings. IMPRESSION: 1. Findings consistent with mild acute on chronic pancreatitis. 2. Mild interval increase in size of the area of parenchymal low attenuation within the tail of the pancreas which may represent a small area of pancreatic necrosis. MRI follow-up is recommended to exclude the presence of an underlying neoplastic process. 3. Stable pancreatic ductal stent positioning. 4. Sigmoid diverticulosis. Electronically Signed   By: Virgina Norfolk M.D.   On: 11/11/2022 00:26    ____________________________________________   PROCEDURES  Procedure(s) performed:   Procedures  None  ____________________________________________   INITIAL IMPRESSION / ASSESSMENT AND PLAN / ED COURSE  Pertinent labs & imaging results that were available during my care of the patient were reviewed by me and considered in my medical decision making (see chart for details).   This patient is Presenting for Evaluation of abdominal pain, which does require a range of treatment options, and is a complaint that involves a high risk of morbidity and mortality.  The Differential Diagnoses includes but is not exclusive to acute cholecystitis, intrathoracic causes for epigastric abdominal pain,  gastritis, duodenitis, pancreatitis, small bowel or large bowel obstruction, abdominal aortic aneurysm, hernia, gastritis, etc.   Critical Interventions-    Medications  insulin aspart (novoLOG) injection 0-9 Units (9 Units Subcutaneous Given 11/11/22 0933)  oxyCODONE (Oxy IR/ROXICODONE) immediate release tablet 5 mg (5 mg  Oral Given 11/11/22 0934)  iohexol (OMNIPAQUE) 300 MG/ML solution 100 mL (100 mLs Intravenous Contrast Given 11/10/22 2355)  insulin glargine-yfgn (SEMGLEE) injection 12 Units (12 Units Subcutaneous Given 11/11/22 1023)  morphine (PF) 4 MG/ML injection 4 mg (4 mg Intravenous Given 11/11/22 1022)  ondansetron (ZOFRAN) injection 4 mg (4 mg Intravenous Given 11/11/22 1022)  lactated ringers bolus 1,000 mL (1,000 mLs Intravenous New Bag/Given 11/11/22 1020)    Reassessment after intervention: Symptoms improved.    I decided to review pertinent External Data, and in summary patient followed by [facility] for pancreatic stenting.   Clinical Laboratory Tests Ordered, included lipase mildly elevated to 66.  No leukocytosis.  Hyperglycemia without evidence of DKA  Radiologic Tests Ordered, included CT abdomen/pelvis. I independently interpreted the images and agree with radiology interpretation.   Cardiac Monitor Tracing which shows NSR.    Social Determinants of Health Risk patient is a smoker.  Consult complete with [facility]. They will see in consultation.   Hospitalist, Dr. Marylyn Ishihara. Plan for admit.   Medical Decision Making: Summary:  Presents to the emergency department for evaluation of the gastric pain typical of his pancreatitis pain.  He is afebrile and overall looks well.  CT imaging, performed from [facility] exam, shows acute on chronic pancreatitis with question area of necrosis at the pancreatic tail.  Stent appears in stable position.  After IV fluids and pain medication pain is improving slightly.  Plan for 24-hour observation for continued pain management, n.p.o., GI consultation as above.  Reevaluation with update and discussion with patient. Pain is "coming down" but agrees with 24 hour observation.    Patient's presentation is most consistent with acute presentation with potential threat to life or bodily function.   Disposition:  admit  ____________________________________________  FINAL CLINICAL IMPRESSION(S) / ED DIAGNOSES  Final diagnoses:  Other acute pancreatitis with uninfected necrosis    NEW OUTPATIENT MEDICATIONS STARTED DURING THIS VISIT:  New Prescriptions   CONTINUOUS BLOOD GLUC SENSOR (DEXCOM G6 SENSOR) MISC    Change sensors every 10 days    Note:  This document was prepared using Dragon voice recognition software and may include unintentional dictation errors.  Nanda Quinton, MD, [facility] Emergency [facility]    Musette Kisamore, [facility], MD 11/11/22 1149

## 2022-11-12 ENCOUNTER — Telehealth: Payer: Self-pay

## 2022-11-12 ENCOUNTER — Other Ambulatory Visit (HOSPITAL_COMMUNITY): Payer: Self-pay

## 2022-11-12 DIAGNOSIS — Z9689 Presence of other specified functional implants: Secondary | ICD-10-CM | POA: Diagnosis not present

## 2022-11-12 DIAGNOSIS — R1084 Generalized abdominal pain: Secondary | ICD-10-CM | POA: Diagnosis not present

## 2022-11-12 DIAGNOSIS — K859 Acute pancreatitis without necrosis or infection, unspecified: Secondary | ICD-10-CM | POA: Diagnosis not present

## 2022-11-12 DIAGNOSIS — K869 Disease of pancreas, unspecified: Secondary | ICD-10-CM | POA: Diagnosis not present

## 2022-11-12 DIAGNOSIS — K861 Other chronic pancreatitis: Secondary | ICD-10-CM | POA: Diagnosis not present

## 2022-11-12 LAB — CBC
HCT: 41.2 % (ref 39.0–52.0)
Hemoglobin: 13.7 g/dL (ref 13.0–17.0)
MCH: 35 pg — ABNORMAL HIGH (ref 26.0–34.0)
MCHC: 33.3 g/dL (ref 30.0–36.0)
MCV: 105.4 fL — ABNORMAL HIGH (ref 80.0–100.0)
Platelets: 178 10*3/uL (ref 150–400)
RBC: 3.91 MIL/uL — ABNORMAL LOW (ref 4.22–5.81)
RDW: 11.9 % (ref 11.5–15.5)
WBC: 4.6 10*3/uL (ref 4.0–10.5)
nRBC: 0 % (ref 0.0–0.2)

## 2022-11-12 LAB — COMPREHENSIVE METABOLIC PANEL
ALT: 14 U/L (ref 0–44)
AST: 16 U/L (ref 15–41)
Albumin: 3.3 g/dL — ABNORMAL LOW (ref 3.5–5.0)
Alkaline Phosphatase: 57 U/L (ref 38–126)
Anion gap: 8 (ref 5–15)
BUN: 12 mg/dL (ref 6–20)
CO2: 25 mmol/L (ref 22–32)
Calcium: 8.6 mg/dL — ABNORMAL LOW (ref 8.9–10.3)
Chloride: 104 mmol/L (ref 98–111)
Creatinine, Ser: 0.75 mg/dL (ref 0.61–1.24)
GFR, Estimated: >60 mL/min (ref >60)
Glucose, Bld: 85 mg/dL (ref 70–99)
Potassium: 3.4 mmol/L — ABNORMAL LOW (ref 3.5–5.1)
Sodium: 137 mmol/L (ref 135–145)
Total Bilirubin: 0.8 mg/dL (ref 0.3–1.2)
Total Protein: 6.1 g/dL — ABNORMAL LOW (ref 6.5–8.1)

## 2022-11-12 LAB — GLUCOSE, CAPILLARY
Glucose-Capillary: 122 mg/dL — ABNORMAL HIGH (ref 70–99)
Glucose-Capillary: 130 mg/dL — ABNORMAL HIGH (ref 70–99)
Glucose-Capillary: 228 mg/dL — ABNORMAL HIGH (ref 70–99)
Glucose-Capillary: 97 mg/dL (ref 70–99)

## 2022-11-12 MED ORDER — POTASSIUM CHLORIDE CRYS ER 20 MEQ PO TBCR
40.0000 meq | EXTENDED_RELEASE_TABLET | Freq: Two times a day (BID) | ORAL | Status: DC
  Administered 2022-11-12: 40 meq via ORAL
  Filled 2022-11-12: qty 2

## 2022-11-12 MED ORDER — PANCRELIPASE (LIP-PROT-AMYL) 12000-38000 UNITS PO CPEP
72000.0000 [IU] | ORAL_CAPSULE | Freq: Three times a day (TID) | ORAL | Status: DC
  Administered 2022-11-12: 72000 [IU] via ORAL
  Filled 2022-11-12: qty 6

## 2022-11-12 NOTE — Plan of Care (Signed)
  Problem: Education: Goal: Ability to describe self-care measures that may prevent or decrease complications (Diabetes Survival Skills Education) will improve Outcome: Progressing   Problem: Education: Goal: Knowledge of General Education information will improve Description: Including pain rating scale, medication(s)/side effects and non-pharmacologic comfort measures Outcome: Progressing   Problem: Pain Managment: Goal: General experience of comfort will improve Outcome: Progressing

## 2022-11-12 NOTE — Telephone Encounter (Signed)
PA started and will be updated in additional encounter.

## 2022-11-12 NOTE — TOC CM/SW Note (Signed)
Transition of Care [facility]) Screening Note  Patient Details  Name: Saket Hellstrom Date of Birth: 1985/12/11  Transition of Care [facility]) CM/SW Contact:    Sherie Don, LCSW Phone Number: 11/12/2022, 9:47 AM  Transition of Care Department [facility]) has reviewed patient and no TOC needs have been identified at this time. We will continue to monitor patient advancement through interdisciplinary progression rounds. If new patient transition needs arise, please place a TOC consult.

## 2022-11-12 NOTE — Discharge Summary (Signed)
Physician Discharge Summary  Carlos [name] NIO:[id number] DOB: Oct 11, 1986 DOA: 11/10/2022  PCP: [name], MD  Admit date: 11/10/2022 Discharge date: 11/12/2022  Admitted From: Home Disposition: Home  Recommendations for Outpatient Follow-up:  Follow-up with GI as scheduled  Home Health: N/A Equipment/Devices: N/A  Discharge Condition: Stable CODE STATUS: Full code Diet recommendation: Low-carb diet, soft and liquid diet.  Discharge summary: 37 year old with history of type 1 diabetes on insulin pump, chronic recurrent pancreatitis, portal vein thrombosis and GERD presented with another episode of abdominal pain for 3 days. 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 In the emergency room he was found with persistent pain but otherwise hemodynamically stable. Admitted with GI consultation.   Patient was admitted and treated with IV fluids, IV pain medications currently improved.  Lipase is normal.  Electrolytes are adequate.  Potassium was replaced.  Patient is a scheduled to have ERCP, stent exchange and EUS as outpatient.  Since his pain is controlled today he wants to go home and keep up outpatient follow-up.  Medically stable for discharge.  Plan: To stay on soft and liquid diet.  Use Dilaudid oral medication that he already has prescriptions.  Patient to put his insulin pump back on discharge.  Discharge Diagnoses:  Principal Problem:   Acute on chronic pancreatitis [facility]) Active Problems:   Splenic vein thrombosis   Gastroesophageal reflux disease with esophagitis without hemorrhage   Diabetes mellitus type 1 [facility])    Discharge Instructions  Discharge Instructions     Diet general   Complete by: As directed    Soft and liquid diet for next few days   Increase activity slowly   Complete by: As directed       Allergies as of 11/12/2022        Reactions   Other Anaphylaxis   [name] - anaphylaxis        Medication List     STOP taking these medications    apixaban 5 MG Tabs tablet Commonly known as: Eliquis       TAKE these medications    acetaminophen 325 MG tablet Commonly known as: TYLENOL Take 2 tablets (650 mg total) by mouth every 6 (six) hours as needed for mild pain (or Fever >/= 101).   Dexcom G6 Sensor Misc Change sensors every 10 days   diphenhydramine-acetaminophen 25-500 MG Tabs tablet Commonly known as: TYLENOL PM Take 2 tablets by mouth at bedtime as needed (sleep, pain).   HYDROmorphone 4 MG tablet Commonly known as: Dilaudid Take 1 tablet (4 mg total) by mouth every 4 (four) hours as needed for severe pain.   insulin aspart 100 UNIT/ML injection Commonly known as: novoLOG Inject 120-150 Units into the skin See admin instructions. 120-150 units into insulin pump every 3 days   lipase/protease/amylase 81829 UNITS Cpep capsule Commonly known as: Creon Take 2 capsules (72,000 Units total) by mouth 3 (three) times daily before meals.   omeprazole 40 MG capsule Commonly known as: PRILOSEC Take 1 capsule (40 mg total) by mouth daily.   Omnipod 5 G6 Intro (Gen 5) Kit 1 Device by Does not apply route every 3 (three) days.   ondansetron 8 MG tablet Commonly known as: Zofran Take 1 tablet (8 mg total) by mouth every 8 (eight) hours as needed for nausea or vomiting.        Allergies  Allergen Reactions   Other Anaphylaxis  [name] - anaphylaxis    Consultations: Gastroenterology   Procedures/Studies: CT Abdomen Pelvis W Contrast  Result Date: 11/11/2022 CLINICAL DATA:  Epigastric abdominal pain. EXAM: CT ABDOMEN AND PELVIS WITH CONTRAST TECHNIQUE: Multidetector CT imaging of the abdomen and pelvis was performed using the standard protocol following bolus administration of intravenous contrast. RADIATION DOSE REDUCTION: This exam was performed according to the  departmental dose-optimization program which includes automated exposure control, adjustment of the mA and/or kV according to patient size and/or use of iterative reconstruction technique. CONTRAST:  172mL OMNIPAQUE IOHEXOL 300 MG/ML  SOLN COMPARISON:  October 12, 2022 FINDINGS: Lower chest: No acute abnormality. Hepatobiliary: A stable 12 mm diameter focus of parenchymal low attenuation is seen within the medial aspect of the right lobe of the liver (axial CT image 14, CT series 2). No gallstones, gallbladder wall thickening, or biliary dilatation. Pancreas: Subcentimeter coarse parenchymal calcifications are again seen throughout the pancreas. A pancreatic ductal stent is seen which is stable in positioning. A 2.2 cm x 1.8 cm x 2.6 cm area of parenchymal low attenuation is again seen within the tail of the pancreas (axial CT image 20, CT series 2/coronal reformatted image 65, CT series 5). This is mildly increased in size when compared to the prior exam. Mild peripancreatic inflammatory fat stranding is seen along the tail of the pancreas with stable peripancreatic fluid (approximately 30.54 Hounsfield units) noted along the anterior aspect of the pancreatic body (axial CT images 27 through 33, CT series 2). Spleen: Normal in size without focal abnormality. Adrenals/Urinary Tract: Adrenal glands are unremarkable. Kidneys are normal, without renal calculi, focal lesion, or hydronephrosis. Bladder is unremarkable. Stomach/Bowel: Stomach is within normal limits. Appendix appears normal. No evidence of bowel wall thickening, distention, or inflammatory changes. Noninflamed diverticula are seen throughout the sigmoid colon. Vascular/Lymphatic: No significant vascular findings are present. No enlarged abdominal or pelvic lymph nodes. Reproductive: Prostate is unremarkable. Other: No abdominal wall hernia or abnormality. No abdominopelvic ascites. Musculoskeletal: No acute or significant osseous findings. IMPRESSION: 1.  Findings consistent with mild acute on chronic pancreatitis. 2. Mild interval increase in size of the area of parenchymal low attenuation within the tail of the pancreas which may represent a small area of pancreatic necrosis. MRI follow-up is recommended to exclude the presence of an underlying neoplastic process. 3. Stable pancreatic ductal stent positioning. 4. Sigmoid diverticulosis. Electronically Signed   By: Virgina Norfolk M.D.   On: 11/11/2022 00:26   (Echo, Carotid, EGD, Colonoscopy, ERCP)    Subjective: Patient seen in the morning rounds.  I was asked to see patient again because he wanted to go home.  Patient tells me that his pain is controlled and he needs to go to work tonight so he has to leave.  He ate most of the full liquid diet in the afternoon.   Discharge Exam: Vitals:   11/12/22 0519 11/12/22 1426  BP: 123/80 121/82  Pulse: 65 66  Resp: 18   Temp: 98.1 F (36.7 C) 98.4 F (36.9 C)  SpO2: 99% 100%   Vitals:   11/11/22 2023 11/12/22 0009 11/12/22 0519 11/12/22 1426  BP: (!) 136/93 (!) 130/93 123/80 121/82  Pulse: 73 69 65 66  Resp: 18 16 18    Temp: 98.5 F (36.9 C) 98 F (36.7 C) 98.1 F (36.7 C) 98.4 F (36.9 C)  TempSrc: Oral Oral Oral Oral  SpO2: 100% 100% 99% 100%  Weight:      Height:  General: Pt is alert, awake, not in acute distress Cardiovascular: RRR, S1/S2 +, no rubs, no gallops Respiratory: CTA bilaterally, no wheezing, no rhonchi Abdominal: Soft, NT, ND, bowel sounds +, no tenderness. Extremities: no edema, no cyanosis    The results of significant diagnostics from this hospitalization (including imaging, microbiology, ancillary and laboratory) are listed below for reference.     Microbiology: No results found for this or any previous visit (from the past 240 hour(s)).   Labs: BNP (last 3 results) No results for input(s): "BNP" in the last 8760 hours. Basic Metabolic Panel: Recent Labs  Lab 11/10/22 2303 11/11/22 1144  11/12/22 0443  NA 134* 134* 137  K 3.6 3.6 3.4*  CL 102 100 104  CO2 19* 24 25  GLUCOSE 193* 172* 85  BUN 8 10 12   CREATININE 0.81 0.83 0.75  CALCIUM 9.2 9.1 8.6*   Liver Function Tests: Recent Labs  Lab 11/10/22 2303 11/12/22 0443  AST 21 16  ALT 19 14  ALKPHOS 80 57  BILITOT 1.0 0.8  PROT 7.5 6.1*  ALBUMIN 4.2 3.3*   Recent Labs  Lab 11/10/22 2303  LIPASE 66*   No results for input(s): "AMMONIA" in the last 168 hours. CBC: Recent Labs  Lab 11/10/22 2303 11/12/22 0443  WBC 5.7 4.6  NEUTROABS 3.6  --   HGB 14.8 13.7  HCT 44.3 41.2  MCV 104.2* 105.4*  PLT 222 178   Cardiac Enzymes: No results for input(s): "CKTOTAL", "CKMB", "CKMBINDEX", "TROPONINI" in the last 168 hours. BNP: Invalid input(s): "POCBNP" CBG: Recent Labs  Lab 11/11/22 2118 11/12/22 0010 11/12/22 0445 11/12/22 0730 11/12/22 1129  GLUCAP 123* 228* 97 130* 122*   D-Dimer No results for input(s): "DDIMER" in the last 72 hours. Hgb A1c No results for input(s): "HGBA1C" in the last 72 hours. Lipid Profile No results for input(s): "CHOL", "HDL", "LDLCALC", "TRIG", "CHOLHDL", "LDLDIRECT" in the last 72 hours. Thyroid function studies No results for input(s): "TSH", "T4TOTAL", "T3FREE", "THYROIDAB" in the last 72 hours.  Invalid input(s): "FREET3" Anemia work up No results for input(s): "VITAMINB12", "FOLATE", "FERRITIN", "TIBC", "IRON", "RETICCTPCT" in the last 72 hours. Urinalysis    Component Value Date/Time   COLORURINE YELLOW 11/11/2022 1020   APPEARANCEUR CLEAR 11/11/2022 1020   LABSPEC 1.043 (H) 11/11/2022 1020   PHURINE 5.0 11/11/2022 1020   GLUCOSEU >=500 (A) 11/11/2022 1020   HGBUR NEGATIVE 11/11/2022 1020   BILIRUBINUR NEGATIVE 11/11/2022 1020   KETONESUR 20 (A) 11/11/2022 1020   PROTEINUR NEGATIVE 11/11/2022 1020   NITRITE NEGATIVE 11/11/2022 1020   LEUKOCYTESUR NEGATIVE 11/11/2022 1020   Sepsis Labs Recent Labs  Lab 11/10/22 2303 11/12/22 0443  WBC 5.7 4.6    Microbiology No results found for this or any previous visit (from the past 240 hour(s)).   Time coordinating discharge: 32 minutes  SIGNED:   Barb Merino, MD  [facility] 11/12/2022, 2:51 PM

## 2022-11-12 NOTE — Progress Notes (Signed)
PROGRESS NOTE    Carlos [name]  PJK:[id number] DOB: [birth year] DOA: 11/10/2022 PCP: [name], MD    Brief Narrative:  37 year old with history of type 1 diabetes on insulin, chronic recurrent pancreatitis, portal vein thrombosis and GERD presented with another episode of abdominal pain for 3 days.  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  In the emergency room he was found with persistent pain but otherwise hemodynamically stable.  Admitted with GI consultation.   Assessment & Plan:   Acute on chronic recurrent pancreatitis with abdominal pain: Currently hemodynamically stable.  Tolerating full liquid diet.  Still with significant pain and using IV pain medications.  Continue IV fluids.  Adequate pain control.  On Creon. Followed by GI, debating whether inpatient procedures including stent exchange will be beneficial. Electrolytes are adequate Lipase was 65 on admission, not repeated today.  Will recheck tomorrow.  However with chronic pancreatitis his lipase level will be low.  Type 1 diabetes with hyperglycemia: On reduced dose of long-acting insulin and sliding scale insulin as he is not having reliable oral intake.  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  Eliquis on hold if any procedures needed.  Will resume when no more procedures planned.  Hypokalemia: Replace.  Magnesium is adequate.   DVT prophylaxis: SCDs   Code Status: Full code Family Communication: None Disposition Plan: Status is: Inpatient Remains inpatient appropriate because: Significant abdominal pain, inpatient procedures anticipated     Consultants:  Gastroenterology  Procedures:  None  Antimicrobials:  None   Subjective: Patient seen in the morning rounds.  He was on the phone with insurance company.  On approaching the patient, he told me  his pain is about 7/10 and just use Dilaudid.  Denies any nausea vomiting.  Objective: Vitals:   11/11/22 1809 11/11/22 2023 11/12/22 0009 11/12/22 0519  BP: (!) 139/96 (!) 136/93 (!) 130/93 123/80  Pulse: 79 73 69 65  Resp: 18 18 16 18   Temp: 98.2 F (36.8 C) 98.5 F (36.9 C) 98 F (36.7 C) 98.1 F (36.7 C)  TempSrc: Oral Oral Oral Oral  SpO2: 100% 100% 100% 99%  Weight:      Height:        Intake/Output Summary (Last 24 hours) at 11/12/2022 1002 Last data filed at 11/12/2022 0936 Gross per 24 hour  Intake 3212.32 ml  Output 0 ml  Net 3212.32 ml   Filed Weights   11/10/22 2340  Weight: 74.8 kg    Examination:  Looks comfortable.  Talking in complete sentences on the phone.  On room air. Mild tenderness along the epigastrium and upper quadrants without rigidity or guarding.  Bowel sounds present. Alert awake and oriented.  No neurological deficits.   Data Reviewed: I have personally reviewed following labs and imaging studies  CBC: Recent Labs  Lab 11/10/22 2303 11/12/22 0443  WBC 5.7 4.6  NEUTROABS 3.6  --   HGB 14.8 13.7  HCT 44.3 41.2  MCV 104.2* 105.4*  PLT 222 809   Basic Metabolic Panel: Recent Labs  Lab 11/10/22 2303 11/11/22 1144 11/12/22 0443  NA 134* 134* 137  K 3.6 3.6 3.4*  CL 102 100 104  CO2 19* 24 25  GLUCOSE 193* 172* 85  BUN 8 10 12   CREATININE 0.81 0.83 0.75  CALCIUM 9.2 9.1 8.6*   GFR: Estimated Creatinine Clearance: 135.1 mL/min (by C-G  formula based on SCr of 0.75 mg/dL). Liver Function Tests: Recent Labs  Lab 11/10/22 2303 11/12/22 0443  AST 21 16  ALT 19 14  ALKPHOS 80 57  BILITOT 1.0 0.8  PROT 7.5 6.1*  ALBUMIN 4.2 3.3*   Recent Labs  Lab 11/10/22 2303  LIPASE 66*   No results for input(s): "AMMONIA" in the last 168 hours. Coagulation Profile: No results for input(s): "INR", "PROTIME" in the last 168 hours. Cardiac Enzymes: No results for input(s): "CKTOTAL", "CKMB", "CKMBINDEX", "TROPONINI" in the last 168  hours. BNP (last 3 results) No results for input(s): "PROBNP" in the last 8760 hours. HbA1C: No results for input(s): "HGBA1C" in the last 72 hours. CBG: Recent Labs  Lab 11/11/22 1620 11/11/22 2118 11/12/22 0010 11/12/22 0445 11/12/22 0730  GLUCAP 151* 123* 228* 97 130*   Lipid Profile: No results for input(s): "CHOL", "HDL", "LDLCALC", "TRIG", "CHOLHDL", "LDLDIRECT" in the last 72 hours. Thyroid Function Tests: No results for input(s): "TSH", "T4TOTAL", "FREET4", "T3FREE", "THYROIDAB" in the last 72 hours. Anemia Panel: No results for input(s): "VITAMINB12", "FOLATE", "FERRITIN", "TIBC", "IRON", "RETICCTPCT" in the last 72 hours. Sepsis Labs: No results for input(s): "PROCALCITON", "LATICACIDVEN" in the last 168 hours.  No results found for this or any previous visit (from the past 240 hour(s)).       Radiology Studies: CT Abdomen Pelvis W Contrast  Result Date: 11/11/2022 CLINICAL DATA:  Epigastric abdominal pain. EXAM: CT ABDOMEN AND PELVIS WITH CONTRAST TECHNIQUE: Multidetector CT imaging of the abdomen and pelvis was performed using the standard protocol following bolus administration of intravenous contrast. RADIATION DOSE REDUCTION: This exam was performed according to the departmental dose-optimization program which includes automated exposure control, adjustment of the mA and/or kV according to patient size and/or use of iterative reconstruction technique. CONTRAST:  OMNIPAQUE IOHEXOL 300 MG/ML  SOLN COMPARISON:  October 12, 2022 FINDINGS: Lower chest: No acute abnormality. Hepatobiliary: A stable 12 mm diameter focus of parenchymal low attenuation is seen within the medial aspect of the right lobe of the liver (axial CT image 14, CT series 2). No gallstones, gallbladder wall thickening, or biliary dilatation. Pancreas: Subcentimeter coarse parenchymal calcifications are again seen throughout the pancreas. A pancreatic ductal stent is seen which is stable in  positioning. A 2.2 cm x 1.8 cm x 2.6 cm area of parenchymal low attenuation is again seen within the tail of the pancreas (axial CT image 20, CT series 2/coronal reformatted image 65, CT series 5). This is mildly increased in size when compared to the prior exam. Mild peripancreatic inflammatory fat stranding is seen along the tail of the pancreas with stable peripancreatic fluid (approximately 30.54 Hounsfield units) noted along the anterior aspect of the pancreatic body (axial CT images 27 through 33, CT series 2). Spleen: Normal in size without focal abnormality. Adrenals/Urinary Tract: Adrenal glands are unremarkable. Kidneys are normal, without renal calculi, focal lesion, or hydronephrosis. Bladder is unremarkable. Stomach/Bowel: Stomach is within normal limits. Appendix appears normal. No evidence of bowel wall thickening, distention, or inflammatory changes. Noninflamed diverticula are seen throughout the sigmoid colon. Vascular/Lymphatic: No significant vascular findings are present. No enlarged abdominal or pelvic lymph nodes. Reproductive: Prostate is unremarkable. Other: No abdominal wall hernia or abnormality. No abdominopelvic ascites. Musculoskeletal: No acute or significant osseous findings. IMPRESSION: 1. Findings consistent with mild acute on chronic pancreatitis. 2. Mild interval increase in size of the area of parenchymal low attenuation within the tail of the pancreas which may represent a small area of  pancreatic necrosis. MRI follow-up is recommended to exclude the presence of an underlying neoplastic process. 3. Stable pancreatic ductal stent positioning. 4. Sigmoid diverticulosis. Electronically Signed   By: Virgina Norfolk M.D.   On: 11/11/2022 00:26        Scheduled Meds:  insulin aspart  0-9 Units Subcutaneous Q4H   insulin glargine-yfgn  10 Units Subcutaneous Daily   pantoprazole (PROTONIX) IV  40 mg Intravenous Q24H   potassium chloride  40 mEq Oral BID   Continuous  Infusions:  lactated ringers 125 mL/hr at 11/11/22 2129     LOS: 1 day    Time spent: 35 minutes    Barb Merino, MD [facility] Pager [phone]

## 2022-11-12 NOTE — Telephone Encounter (Signed)
PA request received via providers office for Dexcom G6 Sensor.  PA has been submitted via CMM and is awaiting determination.  Key: [id number]

## 2022-11-12 NOTE — Telephone Encounter (Signed)
Per [facility], [place] patient needs PA on Dexcom sensors

## 2022-11-12 NOTE — Progress Notes (Signed)
Daily Progress Note  Hospital Day: 3  Chief Complaint: pancreatitis  Assessment   Patient profile:  Carlos [name] is a 37 y.o. 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  Admitted 11/11/22 for acute on chronic abdominal pain and acute on chronic pancreatitis.   # Acute on Etoh chronic pancreatitis. He has a PD stricture, s/p stent placement in October.  Due for stent exchange soon. In fact he is scheduled for stent exchange and EUS with celiac plexus block on 12/01/21. Came to ED for worsening pain.CT scan showing acute on chronic pancreatitis and also a mild interval increase in size of an area of parenchymal low attenuation in tale of the pancreas which could be pancreatic necrosis.   Feels a little better today. We offered to proceed with stent exchange tomorrow but he needs to return to work.  Today's labs:  hct 41%, renal function normal, K+ 3.4   Plan:    Advance to full liquids.  Resume Creon.  If tolerates full liquids then okay for discharge from our standpoint. He will keep 1/24 ERCP / EUS appointment   Subjective   Feels a little better compared to yesterday. Tolerating clears. Needs to be discharged, has to work tomorrow. Agrees to at least try full liquids to make sure tolerates before discharge  Objective   Imaging:  CT Abdomen Pelvis W Contrast  Result Date: 11/11/2022 CLINICAL DATA:  Epigastric abdominal pain. EXAM: CT ABDOMEN AND PELVIS WITH CONTRAST TECHNIQUE: Multidetector CT imaging of the abdomen and pelvis was performed using the standard protocol following bolus administration of intravenous contrast. RADIATION DOSE REDUCTION: This exam was performed according to the departmental dose-optimization program which  includes automated exposure control, adjustment of the mA and/or kV according to patient size and/or use of iterative reconstruction technique. CONTRAST:  137mL OMNIPAQUE IOHEXOL 300 MG/ML  SOLN COMPARISON:  October 12, 2022 FINDINGS: Lower chest: No acute abnormality. Hepatobiliary: A stable 12 mm diameter focus of parenchymal low attenuation is seen within the medial aspect of the right lobe of the liver (axial CT image 14, CT series 2). No gallstones, gallbladder wall thickening, or biliary dilatation. Pancreas: Subcentimeter coarse parenchymal calcifications are again seen throughout the pancreas. A pancreatic ductal stent is seen which is stable in positioning. A 2.2 cm x 1.8 cm x 2.6 cm area of parenchymal low attenuation is again seen within the tail of the pancreas (axial CT image 20, CT series 2/coronal reformatted image 65, CT series 5). This is mildly increased in size when compared to the prior exam. Mild peripancreatic inflammatory fat stranding is seen along the tail of the pancreas with stable peripancreatic fluid (approximately 30.54 Hounsfield units) noted along the anterior aspect of the pancreatic body (axial CT images 27 through 33, CT series 2). Spleen: Normal in size without focal abnormality. Adrenals/Urinary Tract: Adrenal glands are unremarkable. Kidneys are normal, without renal calculi, focal lesion, or hydronephrosis. Bladder is unremarkable. Stomach/Bowel: Stomach is within normal limits. Appendix appears normal. No evidence of bowel wall thickening, distention, or inflammatory changes. Noninflamed diverticula are seen throughout the sigmoid colon. Vascular/Lymphatic: No significant vascular findings are present. No enlarged abdominal or pelvic lymph nodes. Reproductive: Prostate is unremarkable. Other: No abdominal wall hernia or abnormality. No abdominopelvic ascites. Musculoskeletal: No acute or significant osseous findings. IMPRESSION: 1.  Findings consistent with mild acute on  chronic pancreatitis. 2. Mild interval increase in size of the area of parenchymal low attenuation within the tail of the pancreas which may represent a small area of pancreatic necrosis. MRI follow-up is recommended to exclude the presence of an underlying neoplastic process. 3. Stable pancreatic ductal stent positioning. 4. Sigmoid diverticulosis. Electronically Signed   By: Virgina Norfolk M.D.   On: 11/11/2022 00:26    Lab Results: Recent Labs    11/10/22 2303 11/12/22 0443  WBC 5.7 4.6  HGB 14.8 13.7  HCT 44.3 41.2  PLT 222 178   BMET Recent Labs    11/10/22 2303 11/11/22 1144 11/12/22 0443  NA 134* 134* 137  K 3.6 3.6 3.4*  CL 102 100 104  CO2 19* 24 25  GLUCOSE 193* 172* 85  BUN 8 10 12   CREATININE 0.81 0.83 0.75  CALCIUM 9.2 9.1 8.6*   LFT Recent Labs    11/12/22 0443  PROT 6.1*  ALBUMIN 3.3*  AST 16  ALT 14  ALKPHOS 57  BILITOT 0.8   PT/INR No results for input(s): "LABPROT", "INR" in the last 72 hours.   Scheduled inpatient medications:   insulin aspart  0-9 Units Subcutaneous Q4H   insulin glargine-yfgn  10 Units Subcutaneous Daily   pantoprazole (PROTONIX) IV  40 mg Intravenous Q24H   Continuous inpatient infusions:   lactated ringers 125 mL/hr at 11/11/22 2129   PRN inpatient medications: HYDROmorphone (DILAUDID) injection, prochlorperazine  Vital signs in last 24 hours: Temp:  [98 F (36.7 C)-99.2 F (37.3 C)] 98.1 F (36.7 C) (01/05 0519) Pulse Rate:  [65-97] 65 (01/05 0519) Resp:  [16-18] 18 (01/05 0519) BP: (123-139)/(80-96) 123/80 (01/05 0519) SpO2:  [98 %-100 %] 99 % (01/05 0519)    Intake/Output Summary (Last 24 hours) at 11/12/2022 1001 Last data filed at 11/12/2022 0936 Gross per 24 hour  Intake 3212.32 ml  Output 0 ml  Net 3212.32 ml    Intake/Output from previous day: 01/04 0701 - 01/05 0700 In: 2912.3 [P.O.:600; I.V.:1312.3; IV Piggyback:1000] Out: 0  Intake/Output this shift: Total I/O In: 300 [P.O.:300] Out: -     Physical Exam:  General: Alert thin male in NAD Heart:  Regular rate and rhythm.  Pulmonary: Normal respiratory effort Abdomen: Soft, nondistended, mild-moderate generalized upper abdominal tenderness. Active bowel sounds Extremities: No lower extremity edema  Neurologic: Alert and oriented Psych: Pleasant. Cooperative.    Principal Problem:   Acute on chronic pancreatitis ([place]) Active Problems:   Splenic vein thrombosis   Gastroesophageal reflux disease with esophagitis without hemorrhage   Diabetes mellitus type 1 ([place])     LOS: 1 day   Tye Savoy ,NP 11/12/2022, 10:01 AM

## 2022-11-15 ENCOUNTER — Telehealth: Payer: Self-pay

## 2022-11-15 DIAGNOSIS — K861 Other chronic pancreatitis: Secondary | ICD-10-CM

## 2022-11-15 NOTE — Telephone Encounter (Signed)
MRI MRCP has been entered and sent to the schedulers.    Left message on machine to call back

## 2022-11-15 NOTE — Telephone Encounter (Signed)
Patient is returning your call.  

## 2022-11-15 NOTE — Telephone Encounter (Signed)
-----   Message from Irving Copas., MD sent at 11/12/2022  3:53 PM EST ----- Magda Muise, Please schedule MRI/MRCP within 1 week of this procedure date. Thanks. GM ----- Message ----- From: Willia Craze, NP Sent: 11/12/2022   3:04 PM EST To: Timothy Lasso, RN; Irving Copas., MD  Mazen Marcin,  You may have already gotten a message about this. Patient is scheduled for EUS / ERCP on 1/24. Dr. Rush Landmark wants an MRI / MRCP a few days prior to procedure to evaluate an area in tail of pancreas.  Thanks

## 2022-11-15 NOTE — Telephone Encounter (Signed)
The pt has been advised to expect a call from the schedulers to set up MRI

## 2022-11-16 NOTE — Telephone Encounter (Signed)
Patient Advocate Encounter  Prior Authorization for Dexcom G6 Sensor  has been approved through [employer].    Key: [id number]   Effective: 11-12-2022 to 11-13-2023

## 2022-11-17 ENCOUNTER — Ambulatory Visit (INDEPENDENT_AMBULATORY_CARE_PROVIDER_SITE_OTHER): Payer: Medicaid Other | Admitting: Internal Medicine

## 2022-11-17 ENCOUNTER — Encounter: Payer: Self-pay | Admitting: Internal Medicine

## 2022-11-17 VITALS — BP 130/70 | HR 91 | Ht 77.0 in | Wt 155.0 lb

## 2022-11-17 DIAGNOSIS — E1065 Type 1 diabetes mellitus with hyperglycemia: Secondary | ICD-10-CM | POA: Diagnosis not present

## 2022-11-17 LAB — POCT GLUCOSE (DEVICE FOR HOME USE): POC Glucose: 256 mg/dl — AB (ref 70–99)

## 2022-11-17 MED ORDER — OMNIPOD 5 DEXG7G6 PODS GEN 5 MISC: 1.0000 | 3 refills | Status: DC

## 2022-11-17 MED ORDER — DEXCOM G6 TRANSMITTER MISC: 1.0000 | 3 refills | Status: DC

## 2022-11-17 MED ORDER — LANTUS SOLOSTAR 100 UNIT/ML ~~LOC~~ SOPN: 16.0000 [IU] | PEN_INJECTOR | Freq: Every day | SUBCUTANEOUS | 3 refills | Status: DC

## 2022-11-17 MED ORDER — INSULIN PEN NEEDLE 32G X 4 MM MISC: 1.0000 | Freq: Every day | 3 refills | Status: DC

## 2022-11-17 MED ORDER — DEXCOM G6 SENSOR MISC: 1.0000 | 3 refills | Status: DC

## 2022-11-17 NOTE — Patient Instructions (Addendum)
-  Take  Lantus 16 units once daily ( when you are off the Omnipod for more than 4 hours)  - Increase Novolog to 6 units with each meal PLUS additional if needed as below  -Novolog correctional insulin: ADD extra units on insulin to your meal-time Novolog dose if your blood sugars are higher than 180. Use the scale below to help guide you:   Blood sugar before meal Number of units to inject  Less than 180 0 unit  181 -  230 1 units  231 -  280 2 units  281 -  330 3 units  331 -  380 4 units  381 -  430 5 units  431 -  480 6 units  481 -  530 7 units   HOW TO TREAT LOW BLOOD SUGARS (Blood sugar LESS THAN 70 MG/DL) Please follow the RULE OF 15 for the treatment of hypoglycemia treatment (when your (blood sugars are less than 70 mg/dL)   STEP 1: Take 15 grams of carbohydrates when your blood sugar is low, which includes:  3-4 GLUCOSE TABS  OR 3-4 OZ OF JUICE OR REGULAR SODA OR ONE TUBE OF GLUCOSE GEL    STEP 2: RECHECK blood sugar in 15 MINUTES STEP 3: If your blood sugar is still low at the 15 minute recheck --> then, go back to STEP 1 and treat AGAIN with another 15 grams of carbohydrates.

## 2022-11-17 NOTE — Progress Notes (Signed)
Name: Carlos [name]  MRN/ DOB: [id number], Jun 15, 1986   Age/ Sex: 37 y.o., male    PCP: [name], MD   Reason for Endocrinology Evaluation: Type 1 Diabetes Mellitus     Date of Initial Endocrinology Visit: 03/10/2022    PATIENT IDENTIFIER: Carlos [name] is a 37 y.o. male with a past medical history of T1DM, Hx of chronic pancreatitis due to ETOH abuse. The patient presented for initial endocrinology clinic visit on 03/10/2022 for consultative assistance with his diabetes management.    HPI: Carlos [name] was    Diagnosed with DM 09/2021 Hemoglobin A1c has ranged from 9.2% in 2023, peaking at 10.1% in 2022.   He is a Estate agent  at [employer] , works 3rd shift   On his initial visit to our clinic he had an A1c of 6.9%, we adjusted MDI regimen and provided him with a correction factor to be used before each meal   He was trained on Dexcom/OmniPod 05/2022  SUBJECTIVE:   During the last visit (03/10/2022): A1c 6.9%  Today (11/17/22): Carlos [name] is here for a follow up on diabetes management. He  checks his blood sugars 4 times daily. The patient has not had hypoglycemic episodes since the last clinic visit.  Since his last visit here he has been admitted multiple times for pancreatitis, continues with abdominal pain, denies nausea unless with severe pain, denies diarrhea   He has been without the insulin pump for 3 months as well and CGM.  He has only been using prandial insulin with each meal but no basal insulin hence worsening glycemic control   Pump and meter download:    Pump   OmniPod Settings   Insulin type   novolog   Basal rate       0000 0.5 u/h               I:C ratio       0000 1:12                  Sensitivity       0000  50      Goal       0000  110             Type & Model of Pump: OmniPod Insulin Type: Currently using novolog.  Body mass index is 18.38 kg/m.  PUMP STATISTICS: Not being used     HOME DIABETES REGIMEN: Lantus  16 units daily - not taking  Novolog 5 units TIDQAC CF: Novolog (BG-130/50)   Statin: no ACE-I/ARB: no Prior Diabetic Education: yes    CONTINUOUS GLUCOSE MONITORING RECORD INTERPRETATION: N/A       DIABETIC COMPLICATIONS: Microvascular complications:   Denies: CKD, neuropathy, retinopathy Last eye exam: Completed 05/2022  Macrovascular complications:   Denies: CAD, PVD, CVA   PAST HISTORY: Past Medical History:  Past Medical History:  Diagnosis Date   AKI (acute kidney injury) (HCC) 12/21/2021   Anxiety    Diabetes (HCC)    Pancreatitis    Splenic vein thrombosis    Past Surgical History:  Past Surgical History:  Procedure Laterality Date   BIOPSY  12/02/2021   Procedure: BIOPSY;  Surgeon: Lemar Lofty., MD;  Location: [facility];  Service: Gastroenterology;;   ENDOSCOPIC RETROGRADE CHOLANGIOPANCREATOGRAPHY (ERCP) WITH PROPOFOL N/A 08/12/2022   Procedure: ENDOSCOPIC RETROGRADE CHOLANGIOPANCREATOGRAPHY (ERCP) WITH PROPOFOL;  Surgeon: Lemar Lofty., MD;  Location: [facility];  Service: Gastroenterology;  Laterality: N/A;   ESOPHAGOGASTRODUODENOSCOPY (EGD) WITH  PROPOFOL N/A 12/02/2021   Procedure: ESOPHAGOGASTRODUODENOSCOPY (EGD) WITH PROPOFOL;  Surgeon: Rush Landmark Telford Nab., MD;  Location: [facility];  Service: Gastroenterology;  Laterality: N/A;   NO PAST SURGERIES     PANCREATIC STENT PLACEMENT  08/12/2022   Procedure: PANCREATIC STENT PLACEMENT;  Surgeon: Irving Copas., MD;  Location: [facility];  Service: Gastroenterology;;   Joan Mayans  08/12/2022   Procedure: Joan Mayans;  Surgeon: Irving Copas., MD;  Location: [facility];  Service: Gastroenterology;;   UPPER ESOPHAGEAL ENDOSCOPIC ULTRASOUND (EUS) N/A 12/02/2021   Procedure: UPPER ESOPHAGEAL ENDOSCOPIC ULTRASOUND (EUS);  Surgeon: Irving Copas., MD;  Location: [facility];  Service: Gastroenterology;  Laterality: N/A;    Social History:  reports  that he has been smoking cigars. He started smoking about 20 years ago. He has never used smokeless tobacco. He reports that he does not currently use alcohol. He reports that he does not use drugs. Family History:  Family History  Problem Relation Age of Onset   Hypertension Mother    Alcoholism Maternal Uncle    Diabetes Maternal Grandmother    Diabetes Maternal Grandfather    Diabetes Paternal Grandmother    Diabetes Paternal Grandfather    Colon cancer Neg Hx    Esophageal cancer Neg Hx    Inflammatory bowel disease Neg Hx    Liver disease Neg Hx    Pancreatic cancer Neg Hx    Rectal cancer Neg Hx    Stomach cancer Neg Hx      HOME MEDICATIONS: Allergies as of 11/17/2022       Reactions   Other Anaphylaxis   [name] - anaphylaxis        Medication List        Accurate as of November 17, 2022 11:46 AM. If you have any questions, ask your nurse or doctor.          acetaminophen 325 MG tablet Commonly known as: TYLENOL Take 2 tablets (650 mg total) by mouth every 6 (six) hours as needed for mild pain (or Fever >/= 101).   Dexcom G6 Sensor Misc Change sensors every 10 days   diphenhydramine-acetaminophen 25-500 MG Tabs tablet Commonly known as: TYLENOL PM Take 2 tablets by mouth at bedtime as needed (sleep, pain).   HYDROmorphone 4 MG tablet Commonly known as: Dilaudid Take 1 tablet (4 mg total) by mouth every 4 (four) hours as needed for severe pain.   insulin aspart 100 UNIT/ML injection Commonly known as: novoLOG Inject 120-150 Units into the skin See admin instructions. 120-150 units into insulin pump every 3 days   lipase/protease/amylase 36000 UNITS Cpep capsule Commonly known as: Creon Take 2 capsules (72,000 Units total) by mouth 3 (three) times daily before meals.   omeprazole 40 MG capsule Commonly known as: PRILOSEC Take 1 capsule (40 mg total) by mouth daily.   Omnipod 5 G6 Intro (Gen 5) Kit 1 Device by Does not apply route every 3  (three) days.   ondansetron 8 MG tablet Commonly known as: Zofran Take 1 tablet (8 mg total) by mouth every 8 (eight) hours as needed for nausea or vomiting.         ALLERGIES: Allergies  Allergen Reactions   Other Anaphylaxis    [name] - anaphylaxis     REVIEW OF SYSTEMS: A comprehensive ROS was conducted with the patient and is negative except as per HPI and below:      OBJECTIVE:   VITAL SIGNS: BP 130/70 (BP Location: Left Arm, Patient Position: Sitting,  Cuff Size: Small)   Pulse 91   Ht 6\' 5"  (1.956 m)   Wt 155 lb (70.3 kg)   SpO2 99%   BMI 18.38 kg/m    PHYSICAL EXAM:  General: Pt appears well and is in NAD  Lungs: Clear with good BS bilat   Heart: RRR   Abdomen: Non tender with guarding   Extremities:  Lower extremities - No pretibial edema.   Neuro: MS is good with appropriate affect, pt is alert and Ox3    DM foot exam: 03/10/2022  The skin of the feet is intact without sores or ulcerations. The pedal pulses are 2+ on right and 2+ on left. The sensation is intact to a screening 5.07, 10 gram monofilament bilaterally   DATA REVIEWED:  Lab Results  Component Value Date   HGBA1C 8.4 (H) 11/03/2022   HGBA1C 7.0 (H) 07/20/2022   HGBA1C 6.9 (A) 03/10/2022    Latest Reference Range & Units 11/12/22 04:43  Sodium 135 - 145 mmol/L 137  Potassium 3.5 - 5.1 mmol/L 3.4 (L)  Chloride 98 - 111 mmol/L 104  CO2 22 - 32 mmol/L 25  Glucose 70 - 99 mg/dL 85  BUN 6 - 20 mg/dL 12  Creatinine 0.61 - 1.24 mg/dL 0.75  Calcium 8.9 - 10.3 mg/dL 8.6 (L)  Anion gap 5 - 15  8  Alkaline Phosphatase 38 - 126 U/L 57  Albumin 3.5 - 5.0 g/dL 3.3 (L)  AST 15 - 41 U/L 16  ALT 0 - 44 U/L 14  Total Protein 6.5 - 8.1 g/dL 6.1 (L)  Total Bilirubin 0.3 - 1.2 mg/dL 0.8  GFR, Estimated >60 mL/min >60   In office BG 256 mg/DL  ASSESSMENT / PLAN / RECOMMENDATIONS:   1) Type 1 Diabetes Mellitus, Poorly  controlled, Without complications - Most recent A1c of 8.4 %. Goal  A1c < 7.0 %.     -Patient with worsening glycemic control, this is multifactorial given lack of basal insulin, lack of consistent insulin pump use, multiple hospitalization for pancreatitis -I have advised the patient that in the future if he is going to be without any insulin pump, he will need to stay on basal insulin daily -Apparently he has been waiting on prior authorization for the Dexcom and the OmniPod, we were not aware of this until this past week -Prior authorization has been done today and was approved for the Dexcom and OmniPod, and new refill has been sent to the pharmacy for the next year -I also had refilled his Lantus in case is needed for any insulin pump interruptions in the future -Patient is not going to count his carbohydrates as he is inconsistent with this, patient will be advised to enter 6 g of carbohydrates with each meal    MEDICATIONS: NovoLog      Pump   OmniPod Settings   Insulin type   novolog   Basal rate       0000 0.5 u/h               I:C ratio       0000 1:1 Enter #6 TIDQAC                  Sensitivity       0000  50      Goal       0000  110         EDUCATION / INSTRUCTIONS: BG monitoring instructions: Patient is instructed to check his blood  sugars 3 times a day, before meals . Call [facility] if: BG persistently < 70  I reviewed the Rule of 15 for the treatment of hypoglycemia in detail with the patient. Literature supplied.   2) Diabetic complications:  Eye: Does not have known diabetic retinopathy.  Neuro/ Feet: Does not have known diabetic peripheral neuropathy. Renal: Patient does not have known baseline CKD. He is not on an ACEI/ARB at present.  3) Lipids: Lipid panel is acceptable, no indication for statin therapy at this time   Follow-up in 4 months Signed electronically by: Mack Guise, MD  [facility] End[facility]  [facility] [address] [place],  [place] [address] Phone: [phone] FAX: [phone]   CC: [name], [place] [address] Phone: [phone]  Fax: [phone]    Return to Endocrinology clinic as below: Future Appointments  Date Time Provider [place]  11/17/2022 11:50 AM Brailon Don, Melanie Crazier, MD [facility] None  11/24/2022  7:00 AM [facility] 1 [facility] [place]  02/02/2023  9:00 AM [name], MD [facility] None

## 2022-11-18 ENCOUNTER — Other Ambulatory Visit (HOSPITAL_COMMUNITY): Payer: Self-pay

## 2022-11-22 ENCOUNTER — Telehealth: Payer: Self-pay

## 2022-11-22 ENCOUNTER — Other Ambulatory Visit: Payer: Self-pay | Admitting: Family Medicine

## 2022-11-22 DIAGNOSIS — K860 Alcohol-induced chronic pancreatitis: Secondary | ICD-10-CM

## 2022-11-22 DIAGNOSIS — G8929 Other chronic pain: Secondary | ICD-10-CM

## 2022-11-22 NOTE — Telephone Encounter (Signed)
The pt has been rescheduled to [facility] on 12/01/22 at 815 am to arrive at 745 am NPO 8 hours  Order faxed to [phone]  Spoke with the pt and he has a endoscopic procedure on 1/24 and will need to be moved up sooner.   The pt has been moved up to 11/26/22 at 945 am to arrive at 915 am.    The pt has been advised and instructed.  Address, phone number and prep instructions provided.

## 2022-11-23 ENCOUNTER — Encounter: Payer: Self-pay | Admitting: Family Medicine

## 2022-11-23 MED ORDER — HYDROMORPHONE HCL 4 MG PO TABS: 4.0000 mg | ORAL_TABLET | ORAL | 0 refills | Status: DC | PRN

## 2022-11-24 ENCOUNTER — Ambulatory Visit (HOSPITAL_COMMUNITY): Admission: RE | Admit: 2022-11-24 | Payer: Medicaid Other | Source: Ambulatory Visit

## 2022-11-24 ENCOUNTER — Encounter (HOSPITAL_COMMUNITY): Payer: Self-pay

## 2022-11-24 ENCOUNTER — Encounter (HOSPITAL_COMMUNITY): Payer: Self-pay | Admitting: Gastroenterology

## 2022-11-24 NOTE — Progress Notes (Signed)
Attempted to obtain medical history via telephone, unable to reach at this time. HIPAA compliant voicemail message left requesting return call to pre surgical testing department.  

## 2022-11-25 NOTE — Telephone Encounter (Signed)
Duplicate

## 2022-11-27 ENCOUNTER — Encounter: Payer: Self-pay | Admitting: Gastroenterology

## 2022-11-27 NOTE — Progress Notes (Signed)
Review of outside MRI from 11/26/2022  FINDINGS:   Liver is upper normal at 19 cm.  Spleen is normal in size of 7.3 cm.  Gallbladder present. No biliary dilatation. No filling defects in common bile duct.  There is distal pancreatic ductal dilatation to 5 mm. This ends in the mid body of the pancreas There is a cystic lesion in the tail of the pancreas measuring 15 x 12.7 x 18.1 mm.  IMPRESSION:  1. 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  2. Pancreatic ductal dilatation that ends in the body of the pancreas. Consider endoscopic ultrasound to look for mass  3. 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   Justice Britain, MD [facility] Office # [phone]

## 2022-11-29 ENCOUNTER — Telehealth: Payer: Self-pay | Admitting: Gastroenterology

## 2022-11-29 NOTE — Telephone Encounter (Signed)
Shawna from [facility] calling trying to reach [place] regarding patient's upcoming procedure. Please advise

## 2022-11-30 NOTE — Telephone Encounter (Signed)
Janett Billow from [facility] is calling trying to reach [facility] regarding patient's upcoming procedure. Please advise [phone]

## 2022-11-30 NOTE — Progress Notes (Signed)
Thanks Rovonda. I have been able to review. No change in plan of action for procedure later this week. GM

## 2022-12-01 ENCOUNTER — Ambulatory Visit (HOSPITAL_COMMUNITY)
Admission: RE | Admit: 2022-12-01 | Discharge: 2022-12-01 | Disposition: A | Discharge status: Home / Self Care | Payer: Medicaid Other | Source: Ambulatory Visit | Attending: Gastroenterology | Admitting: Gastroenterology

## 2022-12-01 ENCOUNTER — Ambulatory Visit (HOSPITAL_COMMUNITY): Payer: Medicaid Other

## 2022-12-01 ENCOUNTER — Ambulatory Visit (HOSPITAL_COMMUNITY): Payer: Medicaid Other | Admitting: Anesthesiology

## 2022-12-01 ENCOUNTER — Encounter (HOSPITAL_COMMUNITY): Payer: Self-pay | Admitting: Gastroenterology

## 2022-12-01 ENCOUNTER — Other Ambulatory Visit: Payer: Self-pay

## 2022-12-01 ENCOUNTER — Ambulatory Visit (HOSPITAL_BASED_OUTPATIENT_CLINIC_OR_DEPARTMENT_OTHER): Payer: Medicaid Other | Admitting: Anesthesiology

## 2022-12-01 ENCOUNTER — Other Ambulatory Visit (HOSPITAL_COMMUNITY): Payer: Self-pay

## 2022-12-01 ENCOUNTER — Encounter (HOSPITAL_COMMUNITY)
Admission: RE | Disposition: A | Discharge status: Home / Self Care | Payer: Self-pay | Source: Ambulatory Visit | Attending: Gastroenterology

## 2022-12-01 DIAGNOSIS — R932 Abnormal findings on diagnostic imaging of liver and biliary tract: Secondary | ICD-10-CM | POA: Insufficient documentation

## 2022-12-01 DIAGNOSIS — E1065 Type 1 diabetes mellitus with hyperglycemia: Secondary | ICD-10-CM

## 2022-12-01 DIAGNOSIS — Z9641 Presence of insulin pump (external) (internal): Secondary | ICD-10-CM | POA: Insufficient documentation

## 2022-12-01 DIAGNOSIS — Z4659 Encounter for fitting and adjustment of other gastrointestinal appliance and device: Secondary | ICD-10-CM | POA: Diagnosis not present

## 2022-12-01 DIAGNOSIS — K2289 Other specified disease of esophagus: Secondary | ICD-10-CM

## 2022-12-01 DIAGNOSIS — F17210 Nicotine dependence, cigarettes, uncomplicated: Secondary | ICD-10-CM

## 2022-12-01 DIAGNOSIS — K3189 Other diseases of stomach and duodenum: Secondary | ICD-10-CM | POA: Diagnosis not present

## 2022-12-01 DIAGNOSIS — K861 Other chronic pancreatitis: Secondary | ICD-10-CM

## 2022-12-01 DIAGNOSIS — X58XXXA Exposure to other specified factors, initial encounter: Secondary | ICD-10-CM | POA: Insufficient documentation

## 2022-12-01 DIAGNOSIS — I899 Noninfective disorder of lymphatic vessels and lymph nodes, unspecified: Secondary | ICD-10-CM | POA: Diagnosis not present

## 2022-12-01 DIAGNOSIS — K8689 Other specified diseases of pancreas: Secondary | ICD-10-CM

## 2022-12-01 DIAGNOSIS — F17290 Nicotine dependence, other tobacco product, uncomplicated: Secondary | ICD-10-CM | POA: Diagnosis not present

## 2022-12-01 DIAGNOSIS — Z794 Long term (current) use of insulin: Secondary | ICD-10-CM | POA: Diagnosis not present

## 2022-12-01 DIAGNOSIS — T85590A Other mechanical complication of bile duct prosthesis, initial encounter: Secondary | ICD-10-CM | POA: Insufficient documentation

## 2022-12-01 DIAGNOSIS — K863 Pseudocyst of pancreas: Secondary | ICD-10-CM | POA: Diagnosis not present

## 2022-12-01 DIAGNOSIS — Z9889 Other specified postprocedural states: Secondary | ICD-10-CM

## 2022-12-01 HISTORY — PX: REMOVAL OF STONES: SHX5545

## 2022-12-01 HISTORY — PX: UPPER ESOPHAGEAL ENDOSCOPIC ULTRASOUND (EUS): SHX6562

## 2022-12-01 HISTORY — PX: ESOPHAGOGASTRODUODENOSCOPY: SHX5428

## 2022-12-01 HISTORY — PX: BILIARY DILATION: SHX6850

## 2022-12-01 HISTORY — PX: FINE NEEDLE ASPIRATION: SHX5430

## 2022-12-01 HISTORY — PX: PANCREATIC STENT PLACEMENT: SHX5539

## 2022-12-01 HISTORY — PX: NEUROLYTIC CELIAC PLEXUS: SHX5435

## 2022-12-01 HISTORY — PX: SPHINCTEROTOMY: SHX5544

## 2022-12-01 HISTORY — PX: STENT REMOVAL: SHX6421

## 2022-12-01 HISTORY — PX: ENDOSCOPIC RETROGRADE CHOLANGIOPANCREATOGRAPHY (ERCP) WITH PROPOFOL: SHX5810

## 2022-12-01 LAB — PANC CYST FLD ANLYS-PATHFNDR-TG

## 2022-12-01 LAB — GLUCOSE, CAPILLARY: Glucose-Capillary: 350 mg/dL — ABNORMAL HIGH (ref 70–99)

## 2022-12-01 SURGERY — UPPER ESOPHAGEAL ENDOSCOPIC ULTRASOUND (EUS)
Anesthesia: General

## 2022-12-01 MED ORDER — MIDAZOLAM HCL 5 MG/5ML IJ SOLN
INTRAMUSCULAR | Status: DC | PRN
  Administered 2022-12-01: 2 mg via INTRAVENOUS

## 2022-12-01 MED ORDER — SODIUM CHLORIDE 0.9 % IV SOLN: INTRAVENOUS | Status: DC

## 2022-12-01 MED ORDER — BUPIVACAINE HCL (PF) 0.25 % IJ SOLN
20.0000 mL | Freq: Once | INTRAMUSCULAR | Status: DC
  Filled 2022-12-01: qty 20

## 2022-12-01 MED ORDER — BUPIVACAINE HCL 0.25 % IJ SOLN
INTRAMUSCULAR | Status: DC | PRN
  Administered 2022-12-01: 20 mL

## 2022-12-01 MED ORDER — DICLOFENAC SUPPOSITORY 100 MG
RECTAL | Status: AC
  Filled 2022-12-01: qty 1

## 2022-12-01 MED ORDER — LIDOCAINE 2% (20 MG/ML) 5 ML SYRINGE
INTRAMUSCULAR | Status: DC | PRN
  Administered 2022-12-01: 100 mg via INTRAVENOUS

## 2022-12-01 MED ORDER — INSULIN ASPART 100 UNIT/ML IJ SOLN
INTRAMUSCULAR | Status: AC
  Filled 2022-12-01: qty 1

## 2022-12-01 MED ORDER — CIPROFLOXACIN HCL 500 MG PO TABS: 500.0000 mg | ORAL_TABLET | Freq: Two times a day (BID) | ORAL | 0 refills | Status: AC

## 2022-12-01 MED ORDER — ROCURONIUM BROMIDE 10 MG/ML (PF) SYRINGE
PREFILLED_SYRINGE | INTRAVENOUS | Status: DC | PRN
  Administered 2022-12-01 (×3): 20 mg via INTRAVENOUS
  Administered 2022-12-01: 60 mg via INTRAVENOUS

## 2022-12-01 MED ORDER — DICLOFENAC SUPPOSITORY 100 MG
RECTAL | Status: DC | PRN
  Administered 2022-12-01: 100 mg via RECTAL

## 2022-12-01 MED ORDER — FENTANYL CITRATE (PF) 100 MCG/2ML IJ SOLN
25.0000 ug | INTRAMUSCULAR | Status: AC
  Administered 2022-12-01 (×2): 25 ug via INTRAVENOUS

## 2022-12-01 MED ORDER — FENTANYL CITRATE (PF) 100 MCG/2ML IJ SOLN
INTRAMUSCULAR | Status: AC
  Filled 2022-12-01: qty 2

## 2022-12-01 MED ORDER — CIPROFLOXACIN IN D5W 400 MG/200ML IV SOLN
INTRAVENOUS | Status: AC
  Filled 2022-12-01: qty 200

## 2022-12-01 MED ORDER — GLUCAGON HCL RDNA (DIAGNOSTIC) 1 MG IJ SOLR
INTRAMUSCULAR | Status: AC
  Filled 2022-12-01: qty 2

## 2022-12-01 MED ORDER — MIDAZOLAM HCL 2 MG/2ML IJ SOLN
INTRAMUSCULAR | Status: AC
  Filled 2022-12-01: qty 2

## 2022-12-01 MED ORDER — ONDANSETRON HCL 4 MG/2ML IJ SOLN
INTRAMUSCULAR | Status: DC | PRN
  Administered 2022-12-01: 4 mg via INTRAVENOUS

## 2022-12-01 MED ORDER — GLUCAGON HCL RDNA (DIAGNOSTIC) 1 MG IJ SOLR
INTRAMUSCULAR | Status: DC | PRN
  Administered 2022-12-01 (×3): .25 mg via INTRAVENOUS

## 2022-12-01 MED ORDER — CIPROFLOXACIN IN D5W 400 MG/200ML IV SOLN
INTRAVENOUS | Status: DC | PRN
  Administered 2022-12-01: 400 mg via INTRAVENOUS

## 2022-12-01 MED ORDER — SUGAMMADEX SODIUM 200 MG/2ML IV SOLN
INTRAVENOUS | Status: DC | PRN
  Administered 2022-12-01: 200 mg via INTRAVENOUS

## 2022-12-01 MED ORDER — TRIAMCINOLONE ACETONIDE 40 MG/ML IJ SUSP
INTRAMUSCULAR | Status: AC
  Filled 2022-12-01: qty 2

## 2022-12-01 MED ORDER — FENTANYL CITRATE (PF) 100 MCG/2ML IJ SOLN
INTRAMUSCULAR | Status: DC | PRN
  Administered 2022-12-01 (×4): 50 ug via INTRAVENOUS

## 2022-12-01 MED ORDER — LACTATED RINGERS IV SOLN: INTRAVENOUS | Status: DC | PRN

## 2022-12-01 MED ORDER — OXYCODONE HCL 10 MG PO TABS: 10.0000 mg | ORAL_TABLET | Freq: Four times a day (QID) | ORAL | 0 refills | Status: AC | PRN

## 2022-12-01 MED ORDER — PROPOFOL 10 MG/ML IV BOLUS
INTRAVENOUS | Status: AC
  Filled 2022-12-01: qty 20

## 2022-12-01 MED ORDER — SODIUM CHLORIDE 0.9 % IV SOLN
INTRAVENOUS | Status: DC | PRN
  Administered 2022-12-01: 15 mL

## 2022-12-01 MED ORDER — PROPOFOL 10 MG/ML IV BOLUS
INTRAVENOUS | Status: DC | PRN
  Administered 2022-12-01: 200 mg via INTRAVENOUS

## 2022-12-01 MED ORDER — TRIAMCINOLONE ACETONIDE 40 MG/ML IJ SUSP
INTRAMUSCULAR | Status: DC | PRN
  Administered 2022-12-01: 2 mL

## 2022-12-01 MED ORDER — INSULIN ASPART 100 UNIT/ML IJ SOLN
10.0000 [IU] | Freq: Once | INTRAMUSCULAR | Status: AC
  Administered 2022-12-01: 10 [IU] via SUBCUTANEOUS

## 2022-12-01 MED ORDER — DEXAMETHASONE SODIUM PHOSPHATE 10 MG/ML IJ SOLN
INTRAMUSCULAR | Status: DC | PRN
  Administered 2022-12-01: 5 mg via INTRAVENOUS

## 2022-12-01 NOTE — Discharge Instructions (Signed)
YOU HAD AN ENDOSCOPIC PROCEDURE TODAY: Refer to the procedure report and other information in the discharge instructions given to you for any specific questions about what was found during the examination. If this information does not answer your questions, please call [place] office at [phone] to clarify.   YOU SHOULD EXPECT: Some feelings of bloating in the abdomen. Passage of more gas than usual. Walking can help get rid of the air that was put into your GI tract during the procedure and reduce the bloating. If you had a lower endoscopy (such as a colonoscopy or flexible sigmoidoscopy) you may notice spotting of blood in your stool or on the toilet paper. Some abdominal soreness may be present for a day or two, also.  DIET: Your first meal following the procedure should be a light meal and then it is ok to progress to your normal diet. A half-sandwich or bowl of soup is an example of a good first meal. Heavy or fried foods are harder to digest and may make you feel nauseous or bloated. Drink plenty of fluids but you should avoid alcoholic beverages for 24 hours. If you had a esophageal dilation, please see attached instructions for diet.    ACTIVITY: Your care partner should take you home directly after the procedure. You should plan to take it easy, moving slowly for the rest of the day. You can resume normal activity the day after the procedure however YOU SHOULD NOT DRIVE, use power tools, machinery or perform tasks that involve climbing or major physical exertion for 24 hours (because of the sedation medicines used during the test).   SYMPTOMS TO REPORT IMMEDIATELY: A gastroenterologist can be reached at any hour. Please call [phone]  for any of the following symptoms:  Following lower endoscopy (colonoscopy, flexible sigmoidoscopy) Excessive amounts of blood in the stool  Significant tenderness, worsening of abdominal pains  Swelling of the abdomen that is new, acute  Fever of 100 or  higher  Following upper endoscopy (EGD, EUS, ERCP, esophageal dilation) Vomiting of blood or coffee ground material  New, significant abdominal pain  New, significant chest pain or pain under the shoulder blades  Painful or persistently difficult swallowing  New shortness of breath  Black, tarry-looking or red, bloody stools  FOLLOW UP:  If any biopsies were taken you will be contacted by phone or by letter within the next 1-3 weeks. Call [phone]  if you have not heard about the biopsies in 3 weeks.  Please also call with any specific questions about appointments or follow up tests.YOU HAD AN ENDOSCOPIC PROCEDURE TODAY: Refer to the procedure report and other information in the discharge instructions given to you for any specific questions about what was found during the examination. If this information does not answer your questions, please call [place] office at [phone] to clarify.   YOU SHOULD EXPECT: Some feelings of bloating in the abdomen. Passage of more gas than usual. Walking can help get rid of the air that was put into your GI tract during the procedure and reduce the bloating. If you had a lower endoscopy (such as a colonoscopy or flexible sigmoidoscopy) you may notice spotting of blood in your stool or on the toilet paper. Some abdominal soreness may be present for a day or two, also.  DIET: Your first meal following the procedure should be a light meal and then it is ok to progress to your normal diet. A half-sandwich or bowl of soup is an example of a   good first meal. Heavy or fried foods are harder to digest and may make you feel nauseous or bloated. Drink plenty of fluids but you should avoid alcoholic beverages for 24 hours. If you had a esophageal dilation, please see attached instructions for diet.    ACTIVITY: Your care partner should take you home directly after the procedure. You should plan to take it easy, moving slowly for the rest of the day. You can resume  normal activity the day after the procedure however YOU SHOULD NOT DRIVE, use power tools, machinery or perform tasks that involve climbing or major physical exertion for 24 hours (because of the sedation medicines used during the test).   SYMPTOMS TO REPORT IMMEDIATELY: A gastroenterologist can be reached at any hour. Please call [phone]  for any of the following symptoms:  Following lower endoscopy (colonoscopy, flexible sigmoidoscopy) Excessive amounts of blood in the stool  Significant tenderness, worsening of abdominal pains  Swelling of the abdomen that is new, acute  Fever of 100 or higher  Following upper endoscopy (EGD, EUS, ERCP, esophageal dilation) Vomiting of blood or coffee ground material  New, significant abdominal pain  New, significant chest pain or pain under the shoulder blades  Painful or persistently difficult swallowing  New shortness of breath  Black, tarry-looking or red, bloody stools  FOLLOW UP:  If any biopsies were taken you will be contacted by phone or by letter within the next 1-3 weeks. Call [phone]  if you have not heard about the biopsies in 3 weeks.  Please also call with any specific questions about appointments or follow up tests. 

## 2022-12-01 NOTE — Op Note (Signed)
[facility] Patient Name: Carlos [name] Procedure Date: 12/01/2022 MRN: [id number] Attending MD: Justice Britain , MD, [id number] Date of Birth: November 01, 1986 CSN: [id number] Age: 37 Admit Type: Outpatient Procedure:                ERCP Indications:              Pancreatic duct stricture, Abnormal MRCP, Chronic                            recurrent pancreatitis, Stent change Providers:                Justice Britain, MD, Burtis Junes, RN, Frazier Richards, Technician Referring MD:             Lillette Boxer. Miller Medicines:                General Anesthesia, Cipro 400 mg IV, Diclofenac 100                            mg rectal, Glucagon 8.34 mg IV Complications:            No immediate complications. Estimated Blood Loss:     Estimated blood loss was minimal. Procedure:                Pre-Anesthesia Assessment:                           - Prior to the procedure, a History and Physical                            was performed, and patient medications and                            allergies were reviewed. The patient's tolerance of                            previous anesthesia was also reviewed. The risks                            and benefits of the procedure and the sedation                            options and risks were discussed with the patient.                            All questions were answered, and informed consent                            was obtained. Prior Anticoagulants: The patient has                            taken no anticoagulant or antiplatelet agents. ASA  Grade Assessment: III - A patient with severe                            systemic disease. After reviewing the risks and                            benefits, the patient was deemed in satisfactory                            condition to undergo the procedure.                           After obtaining informed consent, the scope was                             passed under direct vision. Throughout the                            procedure, the patient's blood pressure, pulse, and                            oxygen saturations were monitored continuously. The                            [id number]-[id number] ([id number] Olympus duodenoscope was                            introduced through the mouth, and used to inject                            contrast into and used to inject contrast into the                            bile duct and ventral pancreatic duct. The ERCP was                            accomplished without difficulty. The patient                            tolerated the procedure. Scope In: Scope Out: Findings:      A pancreatic stent was visible on the scout film.      The esophagus was successfully intubated under direct vision without       detailed examination of the pharynx, larynx, and associated structures,       and upper GI tract. Inspection of the major papilla revealed that a       pancreatic sphincterotomy had been performed previously. The       sphincterotomy appeared open. One plastic pancreatic stent originating       in the pancreatic duct was emerging from the major papilla. The stent       seemed to be partially occluded. One stent was removed from the       pancreatic duct using a Raptor grasping device and sent for cytology.       All trying to then recannulate the  pancreatic duct, on first attempt, a       short 0.035 inch Soft Jagwire was passed into the biliary tree. The bile       duct was then deeply cannulated with the Hydratome sphincterotome.       Contrast was injected. I personally interpreted the bile duct images.       Ductal flow was adequate. Image quality was adequate. Contrast extended       to the hepatic ducts. No other abnormality was noted. A 5 mm biliary       sphincterotomy was made with a monofilament Hydratome sphincterotome       using ERBE electrocautery. There was no post-sphincterotomy  bleeding. To       discover objects, the biliary tree was swept with a Hydratome       sphincterotome. Nothing was found.      I then renegotiated the previous short 0.035 inch Soft Jagwire was       passed into the ventral pancreatic duct. The ventral pancreatic duct was       then deeply cannulated with the Hydratome sphincterotome. Contrast was       injected. I personally interpreted the pancreatic duct images. Ductal       flow of contrast was adequate. Image quality was adequate. And mild       narrowing contrast extended to the pancreatic duct. Opacification of the       entire pancreatic ductal system was successful. Localized irregularity       of the pancreatic duct was seen in the ventral pancreatic duct in the       head of the pancreas, but this seems to be improved from prior. To find       object(s) the ventral pancreatic duct was swept with a retrieval balloon       starting at the pancreatic duct in the body/tail of the pancreas. A few       stone fragments were removed as well as thick pancreatic juice. No       stones remained. The main pancreatic duct within the head was       successfully dilated with a HurriCaine 4 mm balloon dilator. A       pancreatogram was performed that showed no further significant pathology       other than the slight narrowing in the head (again improved from prior).       Thus I decided for at least 1 more round of PD dilation. One 7 Fr by 7       cm plastic pancreatic stent with two external flaps and a single       internal flap was placed 7 cm into the ventral pancreatic duct. Clear       fluid flowed through the stent. The stent was in good position.      The duodenoscope was withdrawn from the patient. Impression:               - Prior pancreatic sphincterotomy appeared open.                           - One partially occluded stent from the pancreatic                            duct was seen in the major papilla. This was  removed and sent for cytology.                           - Alocalized irregularity/narrowing (improved from                            prior) was found in the ventral pancreatic duct in                            the head of the pancreas.                           - Pancreatic stone fragments were found. Complete                            removal was accomplished.                           - A biliary sphincterotomy was performed. The                            biliary tree was swept and nothing was found.                           - The ventral head pancreatic duct was successfully                            dilated to 4 mm.                           - One plastic pancreatic stent was placed into the                            ventral pancreatic duct. Moderate Sedation:      Not Applicable - Patient had care per Anesthesia. Recommendation:           - The patient will be observed post-procedure,                            until all discharge criteria are met.                           -If patient is stable, then we will discharge                            patient to home. Otherwise will arrange inpatient                            observation stay.                           - Patient has a contact number available for                            emergencies. The signs and symptoms of potential  delayed complications were discussed with the                            patient. Return to normal activities tomorrow.                            Written discharge instructions were provided to the                            patient.                           - Low fat diet.                           - Will send patient with oxycodone, if he goes                            home, to help decrease chance of having to return                            to the hospital. Though he understands that if                            things are too significant he would  need to return                            to the hospital.                           - Ciprofloxacin 500 mg twice daily for 3 days (this                            is for the pancreatic cystic lesion that was                            sampled more than for the ERCP).                           - Watch for pancreatitis, bleeding, perforation,                            and cholangitis.                           - Repeat ERCP in 3 months to exchange stent and                            hopefully remove and not have to do any further                            therapies if possible.                           - The findings  and recommendations were discussed                            with the patient.                           - The findings and recommendations were discussed                            with the designated responsible adult. Procedure Code(s):        --- Professional ---                           [phone], Endoscopic retrograde                            cholangiopancreatography (ERCP); with removal and                            exchange of stent(s), biliary or pancreatic duct,                            including pre- and post-dilation and guide wire                            passage, when performed, including sphincterotomy,                            when performed, each stent exchanged                           43264, Endoscopic retrograde                            cholangiopancreatography (ERCP); with removal of                            calculi/debris from biliary/pancreatic duct(s)                           43262, 59, Endoscopic retrograde                            cholangiopancreatography (ERCP); with                            sphincterotomy/papillotomy Diagnosis Code(s):        --- Professional ---                           P11.216K, Other mechanical complication of bile                            duct prosthesis, initial encounter                            R93.2, Abnormal findings on diagnostic imaging of  liver and biliary tract                           K86.89, Other specified diseases of pancreas                           Z46.59, Encounter for fitting and adjustment of                            other gastrointestinal appliance and device                           K86.1, Other chronic pancreatitis CPT copyright 2022 American [employer] Association. All rights reserved. The codes documented in this report are preliminary and upon coder review may  be revised to meet current compliance requirements. Justice Britain, MD 12/01/2022 11:43:36 AM Number of Addenda: 0

## 2022-12-01 NOTE — Anesthesia Postprocedure Evaluation (Signed)
Anesthesia Post Note  Patient: Carlos [name]  Procedure(s) Performed: UPPER ESOPHAGEAL ENDOSCOPIC ULTRASOUND (EUS) ENDOSCOPIC RETROGRADE CHOLANGIOPANCREATOGRAPHY (ERCP) WITH PROPOFOL ESOPHAGOGASTRODUODENOSCOPY (EGD) FINE NEEDLE ASPIRATION (FNA) LINEAR STENT REMOVAL PANCREATIC STENT PLACEMENT REMOVAL OF STONES SPHINCTEROTOMY BILIARY DILATION CELIAC PLEXUS INJECTION     Patient location during evaluation: PACU Anesthesia Type: General Level of consciousness: awake and alert Pain management: pain level controlled Vital Signs Assessment: post-procedure vital signs reviewed and stable Respiratory status: spontaneous breathing, nonlabored ventilation, respiratory function stable and patient connected to nasal cannula oxygen Cardiovascular status: blood pressure returned to baseline and stable Postop Assessment: no apparent nausea or vomiting Anesthetic complications: no  No notable events documented.  Last Vitals:  Vitals:   12/01/22 1210 12/01/22 1220  BP: 120/85 121/80  Pulse: 83 83  Resp: 14 12  Temp:    SpO2: 97% 97%    Last Pain:  Vitals:   12/01/22 1210  TempSrc:   PainSc: 7                  Hoang Reich S

## 2022-12-01 NOTE — Inpatient Diabetes Management (Signed)
Inpatient Diabetes Program Recommendations  AACE/ADA: New Consensus Statement on Inpatient Glycemic Control (2015)  Target Ranges:  Prepandial:   less than 140 mg/dL      Peak postprandial:   less than 180 mg/dL (1-2 hours)      Critically ill patients:  140 - 180 mg/dL   Lab Results  Component Value Date   GLUCAP 350 (H) 12/01/2022   HGBA1C 8.4 (H) 11/03/2022    Review of Glycemic Control  Latest Reference Range & Units 12/01/22 11:30  Glucose-Capillary 70 - 99 mg/dL 350 (H)  (H): Data is abnormally high Diabetes history: type 1 Dm Outpatient Diabetes medications: Lantus 16 units QD/ Novolog 5 units TID when pump is off, Omnipod- Novolog Current orders for Inpatient glycemic control: Novolog 10 units x 1 Decadron 5 mg x 1  Inpatient Diabetes Program Recommendations:    Noted patient here for EGD, pump had been disconnected during procedure and following CBG 350 mg/dL.  Patient is followed by Dr Kelton Pillar, outpatient endocrinology.  See rates below.  Basal rate          0000 0.5 u/h                        I:C ratio          0000 1:12                              Sensitivity          0000  50         Goal          0000  110    Of note, 1 units of Novolog will drop patient 50 pts, thus anticipate that patient could be at high risk of hypoglycemia following the Novolog 10 units x 1.  Secure chat sent to RN to inform.   If to remain inpatient, consider adding insulin pump order set.   Follow.  Thanks, Bronson Curb, MSN, RNC-OB Diabetes Coordinator [phone] (8a-5p)

## 2022-12-01 NOTE — Anesthesia Preprocedure Evaluation (Signed)
Anesthesia Evaluation  Patient identified by MRN, date of birth, ID band Patient awake    Reviewed: Allergy & Precautions, H&P , NPO status , Patient's Chart, lab work & pertinent test results  Airway Mallampati: II  TM Distance: >3 FB Neck ROM: Full    Dental no notable dental hx.    Pulmonary Current Smoker and Patient abstained from smoking.   Pulmonary exam normal breath sounds clear to auscultation       Cardiovascular negative cardio ROS Normal cardiovascular exam Rhythm:Regular Rate:Normal     Neuro/Psych negative neurological ROS  negative psych ROS   GI/Hepatic negative GI ROS,,,(+)     substance abuse  alcohol use  Endo/Other  diabetes, Poorly Controlled, Type 1, Insulin Dependent    Renal/GU negative Renal ROS  negative genitourinary   Musculoskeletal negative musculoskeletal ROS (+)    Abdominal   Peds negative pediatric ROS (+)  Hematology negative hematology ROS (+)   Anesthesia Other Findings   Reproductive/Obstetrics negative OB ROS                             Anesthesia Physical Anesthesia Plan  ASA: 3  Anesthesia Plan: General   Post-op Pain Management: Minimal or no pain anticipated   Induction: Intravenous  PONV Risk Score and Plan: 1 and Ondansetron, Dexamethasone and Treatment may vary due to age or medical condition  Airway Management Planned: Oral ETT  Additional Equipment:   Intra-op Plan:   Post-operative Plan: Extubation in OR  Informed Consent: I have reviewed the patients History and Physical, chart, labs and discussed the procedure including the risks, benefits and alternatives for the proposed anesthesia with the patient or authorized representative who has indicated his/her understanding and acceptance.     Dental advisory given  Plan Discussed with: CRNA and Surgeon  Anesthesia Plan Comments:        Anesthesia Quick  Evaluation

## 2022-12-01 NOTE — Transfer of Care (Signed)
Immediate Anesthesia Transfer of Care Note  Patient: Korban Shearer  Procedure(s) Performed: UPPER ESOPHAGEAL ENDOSCOPIC ULTRASOUND (EUS) ENDOSCOPIC RETROGRADE CHOLANGIOPANCREATOGRAPHY (ERCP) WITH PROPOFOL ESOPHAGOGASTRODUODENOSCOPY (EGD) FINE NEEDLE ASPIRATION (FNA) LINEAR STENT REMOVAL PANCREATIC STENT PLACEMENT REMOVAL OF STONES SPHINCTEROTOMY BILIARY DILATION NEUROLYTIC CELIAC PLEXUS  Patient Location: PACU  Anesthesia Type:General  Level of Consciousness: sedated  Airway & Oxygen Therapy: Patient Spontanous Breathing and Patient connected to face mask oxygen  Post-op Assessment: Report given to RN and Post -op Vital signs reviewed and stable  Post vital signs: Reviewed and stable  Last Vitals:  Vitals Value Taken Time  BP    Temp    Pulse 97 12/01/22 1121  Resp 14 12/01/22 1121  SpO2 100 % 12/01/22 1121  Vitals shown include unvalidated device data.  Last Pain:  Vitals:   12/01/22 0700  TempSrc: Temporal  PainSc: 7          Complications: No notable events documented.

## 2022-12-01 NOTE — Progress Notes (Signed)
Pt CBG post op 350. Dr. Kalman Shan anesthesia made aware, Informed that patient is on insulin pump and just turned it on, Dr. Kalman Shan would like 10 units Novolog given. [place]

## 2022-12-01 NOTE — Progress Notes (Signed)
Post-Procedure Evaluation  Patient evaluated postprocedure recovery area.  Patient with his significant other. Patient with discomfort in his mid abdomen rated 8-9 out of 10 currently.  He began the day at 7 out of 10. We will plan to give the patient fentanyl dosing over the course the next half hour to 45 minutes to see how he is doing to assess whether the patient may be able to be discharged home with short course opioid therapy versus if he will need to come into the hospital for observation. He is at risk of postprocedural pancreatitis from FNA/FNB as well as from our pancreas duct work and stent exchange.    12:30 PM addendum Patient evaluated and is doing better.  Pain better controlled. He thinks he is able to maintain himself at home and would like to go home. I will send oxycodone 10 mg every 6 hours as needed (20/0) to see if we can get the patient to not need to come into the hospital.  He does understand and knows the symptoms of inability to tolerate oral intake or progressive pain where in which he would have to come into the hospital for further evaluation and postprocedural complication evaluation and treatment for possible pancreatitis. Patient and significant other in agreement with this plan. He will be discharged this afternoon.    Justice Britain, MD [facility] Office # [phone]

## 2022-12-01 NOTE — H&P (Signed)
GASTROENTEROLOGY PROCEDURE H&P NOTE   Primary Care Physician: [name], MD  HPI: Carlos [name] is a 37 y.o. male who presents for EUS/ERCP for Celiac Block and evaluation of TOP lesion (likely pseudocyst but to rule out mass) and for ERCP for PD stent exchange in setting of PD stricture.  Past Medical History:  Diagnosis Date   AKI (acute kidney injury) ([place]) 12/21/2021   Anxiety    Diabetes ([place])    Pancreatitis    Splenic vein thrombosis    Past Surgical History:  Procedure Laterality Date   BIOPSY  12/02/2021   Procedure: BIOPSY;  Surgeon: Rush Landmark Telford Nab., MD;  Location: [facility];  Service: Gastroenterology;;   ENDOSCOPIC RETROGRADE CHOLANGIOPANCREATOGRAPHY (ERCP) WITH PROPOFOL N/A 08/12/2022   Procedure: ENDOSCOPIC RETROGRADE CHOLANGIOPANCREATOGRAPHY (ERCP) WITH PROPOFOL;  Surgeon: Irving Copas., MD;  Location: [facility];  Service: Gastroenterology;  Laterality: N/A;   ESOPHAGOGASTRODUODENOSCOPY (EGD) WITH PROPOFOL N/A 12/02/2021   Procedure: ESOPHAGOGASTRODUODENOSCOPY (EGD) WITH PROPOFOL;  Surgeon: Rush Landmark Telford Nab., MD;  Location: [facility];  Service: Gastroenterology;  Laterality: N/A;   NO PAST SURGERIES     PANCREATIC STENT PLACEMENT  08/12/2022   Procedure: PANCREATIC STENT PLACEMENT;  Surgeon: Irving Copas., MD;  Location: [facility];  Service: Gastroenterology;;   Joan Mayans  08/12/2022   Procedure: Joan Mayans;  Surgeon: Irving Copas., MD;  Location: [facility];  Service: Gastroenterology;;   UPPER ESOPHAGEAL ENDOSCOPIC ULTRASOUND (EUS) N/A 12/02/2021   Procedure: UPPER ESOPHAGEAL ENDOSCOPIC ULTRASOUND (EUS);  Surgeon: Irving Copas., MD;  Location: [facility];  Service: Gastroenterology;  Laterality: N/A;   Current Facility-Administered Medications  Medication Dose Route Frequency Provider Last Rate Last Admin   bupivacaine (PF) (MARCAINE) 0.25 % injection 20 mL  20 mL Infiltration Once  [name], Telford Nab., MD        Current Facility-Administered Medications:    bupivacaine (PF) (MARCAINE) 0.25 % injection 20 mL, 20 mL, Infiltration, Once, [name], Telford Nab., MD Allergies  Allergen Reactions   Other Anaphylaxis    [name] - anaphylaxis   Family History  Problem Relation Age of Onset   Hypertension Mother    Alcoholism Maternal Uncle    Diabetes Maternal Grandmother    Diabetes Maternal Grandfather    Diabetes Paternal Grandmother    Diabetes Paternal Grandfather    Colon cancer Neg Hx    Esophageal cancer Neg Hx    Inflammatory bowel disease Neg Hx    Liver disease Neg Hx    Pancreatic cancer Neg Hx    Rectal cancer Neg Hx    Stomach cancer Neg Hx    Social History   Socioeconomic History   Marital status: Single    Spouse name: Not on file   Number of children: 6   Years of education: Not on file   Highest education level: Not on file  Occupational History   Occupation: forklift driver  Tobacco Use   Smoking status: Some Days    Types: Cigars    Start date: 11/08/2002   Smokeless tobacco: Never   Tobacco comments:    Once a week.  Vaping Use   Vaping Use: Never used  Substance and Sexual Activity   Alcohol use: Not Currently   Drug use: Never   Sexual activity: Not on file  Other Topics Concern   Not on file  Social History Narrative   Not on file   Social Determinants of Health   Financial Resource Strain: Not on file  Food Insecurity: No  Food Insecurity (08/14/2022)   Hunger Vital Sign    Worried About Running Out of Food in the Last Year: Never true    Ran Out of Food in the Last Year: Never true  Transportation Needs: No Transportation Needs (08/14/2022)   PRAPARE - Hydrologist (Medical): No    Lack of Transportation (Non-Medical): No  Physical Activity: Not on file  Stress: Not on file  Social Connections: Not on file  Intimate Partner Violence: Not At Risk (08/14/2022)   Humiliation,  Afraid, Rape, and Kick questionnaire    Fear of Current or Ex-Partner: No    Emotionally Abused: No    Physically Abused: No    Sexually Abused: No    Physical Exam: There were no vitals filed for this visit. There is no height or weight on file to calculate BMI. GEN: NAD EYE: Sclerae anicteric ENT: MMM CV: Non-tachycardic GI: Soft, NT/ND NEURO:  Alert & Oriented x 3  Lab Results: No results for input(s): "WBC", "HGB", "HCT", "PLT" in the last 72 hours. BMET No results for input(s): "NA", "K", "CL", "CO2", "GLUCOSE", "BUN", "CREATININE", "CALCIUM" in the last 72 hours. LFT No results for input(s): "PROT", "ALBUMIN", "AST", "ALT", "ALKPHOS", "BILITOT", "BILIDIR", "IBILI" in the last 72 hours. PT/INR No results for input(s): "LABPROT", "INR" in the last 72 hours.   Impression / Plan: This is a 37 y.o.male who presents for EUS/ERCP for Celiac Block and evaluation of TOP lesion (likely pseudocyst but to rule out mass) and for ERCP for PD stent exchange in setting of PD stricture.  The risks of an EUS including intestinal perforation, bleeding, infection, aspiration, and medication effects were discussed as was the possibility it may not give a definitive diagnosis if a biopsy is performed.  When a biopsy of the pancreas is done as part of the EUS, there is an additional risk of pancreatitis at the rate of about 1-2%.  It was explained that procedure related pancreatitis is typically mild, although it can be severe and even life threatening, which is why we do not perform random pancreatic biopsies and only biopsy a lesion/area we feel is concerning enough to warrant the risk.  The risks of an ERCP were discussed at length, including but not limited to the risk of perforation, bleeding, abdominal pain, post-ERCP pancreatitis (while usually mild can be severe and even life threatening).   The risks and benefits of endoscopic evaluation/treatment were discussed with the patient and/or  family; these include but are not limited to the risk of perforation, infection, bleeding, missed lesions, lack of diagnosis, severe illness requiring hospitalization, as well as anesthesia and sedation related illnesses.  The patient's history has been reviewed, patient examined, no change in status, and deemed stable for procedure.  The patient and/or family is agreeable to proceed.    Justice Britain, MD [facility] Office # [phone]

## 2022-12-01 NOTE — Anesthesia Procedure Notes (Signed)
Procedure Name: Intubation Date/Time: 12/01/2022 9:13 AM  Performed by: Lind Covert, CRNAPre-anesthesia Checklist: Patient identified, Emergency Drugs available, Suction available, Patient being monitored and Timeout performed Patient Re-evaluated:Patient Re-evaluated prior to induction Oxygen Delivery Method: Circle system utilized Preoxygenation: Pre-oxygenation with 100% oxygen Induction Type: IV induction Ventilation: Mask ventilation without difficulty Laryngoscope Size: Mac and 4 Grade View: Grade I Tube type: Oral Tube size: 8.0 mm Number of attempts: 1 Airway Equipment and Method: Stylet Placement Confirmation: ETT inserted through vocal cords under direct vision, positive ETCO2 and breath sounds checked- equal and bilateral Secured at: 23 cm Tube secured with: Tape Dental Injury: Teeth and Oropharynx as per pre-operative assessment

## 2022-12-01 NOTE — Op Note (Signed)
[facility] Patient Name: Carlos [name] Procedure Date: 12/01/2022 MRN: [id number] Attending MD: [name] , MD, [id number] Date of Birth: 03/01/1986 CSN: [id number] Age: 37 Admit Type: Outpatient Procedure:                Upper EUS Indications:              Suspected mass in pancreas on MRCP, Celiac plexus                            block for pain secondary to chronic pancreatitis Providers:                [name], MD, Norman Clay, RN, Priscella Mann, Technician Referring MD:             Bertram Millard. Miller Medicines:                General Anesthesia Complications:            No immediate complications. Estimated Blood Loss:     Estimated blood loss was minimal. Procedure:                Pre-Anesthesia Assessment:                           - Prior to the procedure, a History and Physical                            was performed, and patient medications and                            allergies were reviewed. The patient's tolerance of                            previous anesthesia was also reviewed. The risks                            and benefits of the procedure and the sedation                            options and risks were discussed with the patient.                            All questions were answered, and informed consent                            was obtained. Prior Anticoagulants: The patient has                            taken no anticoagulant or antiplatelet agents. ASA                            Grade Assessment: III - A patient with severe  systemic disease. After reviewing the risks and                            benefits, the patient was deemed in satisfactory                            condition to undergo the procedure.                           After obtaining informed consent, the endoscope was                            passed under direct vision. Throughout the                             procedure, the patient's blood pressure, pulse, and                            oxygen saturations were monitored continuously. The                            [id number]-H190 ([id number]) Olympus endoscope was introduced                            through the mouth, and advanced to the second part                            of duodenum. The [id number]-Q190V ([id number]) Olympus                            duodenoscope was introduced through the mouth, and                            advanced to the area of papilla. The [id number]                            ([id number]) Olympus linear ultrasound scope was                            introduced through the mouth, and advanced to the                            duodenum for ultrasound examination from the                            stomach and duodenum. The upper EUS was                            accomplished without difficulty. The patient                            tolerated the procedure. Scope In: Scope Out: Findings:      ENDOSCOPIC FINDING: :      No gross lesions were noted in the  entire esophagus.      The Z-line was irregular and was found 46 cm from the incisors.      Striped mildly erythematous mucosa without bleeding was found in the       gastric antrum.      No other gross lesions were noted in the entire examined stomach.      A previously placed plastic pancreatic stent was seen at the major       papillary orifice.      No other gross lesions were noted in the duodenal bulb, in the first       portion of the duodenum and in the second portion of the duodenum.      ENDOSONOGRAPHIC FINDING: :      An irregular lesion was identified in the distal pancreatic tail. The       lesion was heterogenous and mixed solid and cystic (more suggestive of       walled off necrosis). The mass measured 18 mm by 20 mm in maximal       cross-sectional diameter. The outer margins were irregular. There was       sonographic evidence suggestied abutment of the  splenic artery. An       intact interface was seen between the lesion and the superior mesenteric       artery and celiac trunk suggesting a lack of invasion. Diagnostic needle       aspiration for fluid was performed. Color Doppler imaging was utilized       prior to needle puncture to confirm a lack of significant vascular       structures within the needle path. One pass was made with the [place] expect 22 gauge needle using a transgastric approach. No       stylet was used. The amount of fluid collected was 0.8 mL. The fluid was       cloudy, serosanguinous and watery. Sample(s) were sent for amylase       concentration, cytology and CEA. Fine needle aspiration for cytology was       performed. Color Doppler imaging was utilized prior to needle puncture       to confirm a lack of significant vascular structures within the needle       path. Then I proceeded with two passes with the [place] expect       22 gauge needle using a transgastric approach to the lesion. A stylet       was used. Final cytology results are pending.      The pancreatic duct had hyperechoic walls in the pancreatic head (2.4       mm), genu of the pancreas (2.3 mm) and body of the pancreas (2.0 mm) and       a pancreatic stent could be identified.      Pancreatic parenchymal abnormalities were noted in the entire pancreas.       These consisted of hyperechoic foci with shadowing, lobularity without       honeycombing and hyperechoic strands.      There was no sign of significant endosonographic abnormality in the       common bile duct (3.6 mm), in the common hepatic duct (4.5 mm) and in       the gallbladder. No stones were identified.      Endosonographic imaging of the ampulla showed no intramural       (subepithelial) lesion.  No malignant-appearing lymph nodes were visualized in the celiac region       (level 20), perigastric region and peripancreatic region.      Endosonographic  imaging in the visualized portion of the liver showed no       mass.      The celiac region was visualized. To aid and help the patient hopefully       with pain control, a celiac plexus block was performed. The region of       the celiac plexus and celiac ganglia was identified endosonographically       with Color Doppler imaging, using the take-off of the celiac trunk from       the anterior aspect of the aorta as the main anatomical landmark. Color       Doppler guidance was also used to confirm a lack of significant vascular       structures within the injection needle path. Using a transgastric       approach, a 20 gauge celiac plexus needle was advanced to injection       sites on each side of the celiac artery take-off. Needle aspiration was       performed prior to injection to exclude entry into a blood vessel. A       total of 20 mL of 0.25% bupivacaine and 2 mg of triamcinolone (40 mg/mL)       were injected for the celiac plexus block. The needle was then withdrawn. Impression:               EGD impression:                           - No gross lesions in the entire esophagus. Z-line                            irregular, 46 cm from the incisors.                           - Erythematous mucosa in the antrum. No other gross                            lesions in the entire stomach.                           - Plastic pancreatic stent at the ampulla.                           - No other gross lesions in the duodenal bulb, in                            the first portion of the duodenum and in the second                            portion of the duodenum.                           EUS impression:                           -  A lesion was identified in the pancreatic tail.                            Cytology results are pending. 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                            Fine needle aspiration for fluid performed and then                            for tissue.                           - The pancreatic duct had hyperechoic walls in the                            pancreatic head, genu of the pancreas and body of                            the pancreas; the pancreatic duct stent could be                            visualized in this area                           - Pancreatic parenchymal abnormalities consisting                            of hyperechoic foci, lobularity and hyperechoic                            strands were noted in the entire pancreas.                           - There was no sign of significant pathology in the                            common bile duct, in the common hepatic duct and in                            the gallbladder.                           - No malignant-appearing lymph nodes were                            visualized in the celiac region (level 20),  perigastric region and peripancreatic region.                           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 Moderate Sedation:      Not Applicable - Patient had care per Anesthesia. Recommendation:           - Proceed to scheduled ERCP.                           - Observe patient's clinical course.                           - Monitor for signs/symptoms of pancreatitis,                            infection, bleeding, hypotension, paralysis.                           - The findings and recommendations were discussed                            with the patient.                           - The findings and recommendations were discussed                            with the designated responsible adult. Procedure Code(s):        --- Professional ---                           [phone], Esophagogastroduodenoscopy, flexible,                            transoral; with  transendoscopic ultrasound-guided                            transmural injection of diagnostic or therapeutic                            substance(s) (eg, anesthetic, neurolytic agent) or                            fiducial marker(s) (includes endoscopic ultrasound                            examination of the esophagus, stomach, and either                            the duodenum or a surgically altered stomach where                            the jejunum is examined distal to the anastomosis) Diagnosis Code(s):        ---  Professional ---                           K22.89, Other specified disease of esophagus                           K31.89, Other diseases of stomach and duodenum                           K86.89, Other specified diseases of pancreas                           R93.3, Abnormal findings on diagnostic imaging of                            other parts of digestive tract                           K86.9, Disease of pancreas, unspecified                           I89.9, Noninfective disorder of lymphatic vessels                            and lymph nodes, unspecified                           K86.1, Other chronic pancreatitis CPT copyright 2022 [employer]. All rights reserved. The codes documented in this report are preliminary and upon coder review may  be revised to meet current compliance requirements. Corliss ParishGabriel [name], MD 12/01/2022 11:28:38 AM Number of Addenda: 0

## 2022-12-02 ENCOUNTER — Other Ambulatory Visit (HOSPITAL_COMMUNITY): Payer: Self-pay

## 2022-12-02 LAB — CYTOLOGY - NON PAP

## 2022-12-03 ENCOUNTER — Other Ambulatory Visit: Payer: Self-pay

## 2022-12-03 ENCOUNTER — Telehealth: Payer: Self-pay

## 2022-12-03 ENCOUNTER — Encounter: Payer: Self-pay | Admitting: Gastroenterology

## 2022-12-03 DIAGNOSIS — K861 Other chronic pancreatitis: Secondary | ICD-10-CM

## 2022-12-03 NOTE — Telephone Encounter (Signed)
3/19 at 930 am appt made to see Dr Rush Landmark to discuss ERCP that has been scheduled for 3/27 at 915 am at [facility].   ERCP scheduled, pt instructed and medications reviewed.  Patient instructions mailed to home and sent to My Chart.  Patient to call with any questions or concerns.

## 2022-12-03 NOTE — Telephone Encounter (Signed)
[name], Telford Nab., MD  Timothy Lasso, RN Please send letter when able. 2-3 month follow-up ERCP (would go ahead and put in the books). Follow-up in clinic in 4 to 6 weeks with me or APP.

## 2022-12-10 ENCOUNTER — Encounter: Payer: Self-pay | Admitting: Gastroenterology

## 2022-12-10 NOTE — Progress Notes (Signed)
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  The low glucose however sometimes does concern [place].  He will need continued monitoring of this particular area even though we have not found any overt evidence of cancer as of yet.   These notes will be scanned into the chart.   Justice Britain, MD [facility] Office # [phone]

## 2022-12-10 NOTE — Progress Notes (Signed)
[facility]  Pancreatic cyst fluid analysis Atypical findings - Specimen reveals acute inflammatory exudate only.  Differential diagnosis includes acute inflammation associated with an obstructed duct, inflammation next to malignancy, acute pancreatitis.  Microscopic findings Malignancy = atypical findings Epithelial cell atypia = no epithelial cells Cellularity = hypercellular Cell composition = acute inflammatory cells Mucin = absent Proteinaceous debris = absent Inflammation = present  These results will be scanned into the chart and sent to the patient for his records.

## 2022-12-13 ENCOUNTER — Telehealth: Payer: Self-pay | Admitting: Gastroenterology

## 2022-12-13 NOTE — Telephone Encounter (Signed)
Incoming call from patient states he is returning a call from [place]

## 2022-12-13 NOTE — Progress Notes (Signed)
Left message on machine to call back  

## 2022-12-13 NOTE — Progress Notes (Signed)
The pt has returned call and notified of the results.  He will keep appts as planned.

## 2022-12-13 NOTE — Telephone Encounter (Signed)
See alternate phone note dated 50/5

## 2022-12-14 ENCOUNTER — Other Ambulatory Visit: Payer: Self-pay | Admitting: Internal Medicine

## 2022-12-14 MED ORDER — INSULIN ASPART 100 UNIT/ML IJ SOLN: INTRAMUSCULAR | 3 refills | Status: DC

## 2022-12-16 ENCOUNTER — Other Ambulatory Visit: Payer: Self-pay | Admitting: Internal Medicine

## 2022-12-19 ENCOUNTER — Other Ambulatory Visit: Payer: Self-pay | Admitting: Family Medicine

## 2022-12-19 MED ORDER — HYDROMORPHONE HCL 4 MG PO TABS: 4.0000 mg | ORAL_TABLET | ORAL | 0 refills | Status: DC | PRN

## 2022-12-19 NOTE — Progress Notes (Unsigned)
Refill hydromorphone

## 2022-12-27 ENCOUNTER — Encounter: Payer: Self-pay | Admitting: Gastroenterology

## 2022-12-30 ENCOUNTER — Other Ambulatory Visit: Payer: Self-pay | Admitting: Family Medicine

## 2022-12-30 NOTE — Telephone Encounter (Signed)
Patient is requesting a refill of the following medications: Requested Prescriptions   Pending Prescriptions Disp Refills   HYDROmorphone (DILAUDID) 4 MG tablet 90 tablet 0    Sig: Take 1 tablet (4 mg total) by mouth every 4 (four) hours as needed for severe pain.    Date of last refill: 12/19/22  Refill amount: 90  Treatment agreement date: Out dated, notation made on pending appointment for 02/02/23

## 2022-12-31 ENCOUNTER — Other Ambulatory Visit: Payer: Self-pay | Admitting: Family Medicine

## 2023-01-03 ENCOUNTER — Encounter: Payer: Self-pay | Admitting: Gastroenterology

## 2023-01-03 ENCOUNTER — Other Ambulatory Visit: Payer: Self-pay | Admitting: Family Medicine

## 2023-01-03 ENCOUNTER — Telehealth: Payer: Self-pay | Admitting: Gastroenterology

## 2023-01-03 DIAGNOSIS — K861 Other chronic pancreatitis: Secondary | ICD-10-CM

## 2023-01-03 DIAGNOSIS — R1010 Upper abdominal pain, unspecified: Secondary | ICD-10-CM

## 2023-01-03 MED ORDER — TRAMADOL HCL 50 MG PO TABS: 50.0000 mg | ORAL_TABLET | Freq: Four times a day (QID) | ORAL | 1 refills | Status: DC | PRN

## 2023-01-03 NOTE — Telephone Encounter (Signed)
Responded to pt via MyChart

## 2023-01-03 NOTE — Telephone Encounter (Signed)
Sent Tramadol 50 mg Q6H PRN (30/1).

## 2023-01-03 NOTE — Telephone Encounter (Signed)
Dr Rush Landmark see message from the pt regarding tramadol.  Please also see a request was sent to PCP for dilaudid.

## 2023-01-03 NOTE — Telephone Encounter (Signed)
Will send Tramadol now. I'll write the order. GM

## 2023-01-03 NOTE — Addendum Note (Signed)
Addended by: Justice Britain on: 01/03/2023 04:57 PM   Modules accepted: Orders

## 2023-01-03 NOTE — Telephone Encounter (Signed)
Willing for the patient to receive tramadol if he does not already have it. Recommend CBC/CMP/amylase/lipase be obtained at his convenience. Recommend KUB 2 view to see the PD stent. Let me know if he needs tramadol and I can send in a prescription. Thanks. GM

## 2023-01-03 NOTE — Telephone Encounter (Signed)
Forwarded to [place] due to Dr. Sabra Heck out of office.

## 2023-01-03 NOTE — Addendum Note (Signed)
Addended by: Timothy Lasso on: 01/03/2023 04:42 PM   Modules accepted: Orders

## 2023-01-03 NOTE — Telephone Encounter (Signed)
Inbound call from pt requesting to speak with you regarding mychart message .Please advise

## 2023-01-04 MED ORDER — HYDROMORPHONE HCL 4 MG PO TABS: 4.0000 mg | ORAL_TABLET | ORAL | 0 refills | Status: DC | PRN

## 2023-01-04 NOTE — Telephone Encounter (Signed)
I refilled rx for Dilaudid but did not realize GI sent one fro tramadol. This violates our drug contract!

## 2023-01-05 ENCOUNTER — Encounter: Payer: Self-pay | Admitting: Family Medicine

## 2023-01-05 ENCOUNTER — Ambulatory Visit (INDEPENDENT_AMBULATORY_CARE_PROVIDER_SITE_OTHER): Payer: Medicaid Other | Admitting: Family Medicine

## 2023-01-05 VITALS — BP 126/80 | HR 110 | Temp 97.8°F | Ht 77.0 in | Wt 161.0 lb

## 2023-01-05 DIAGNOSIS — K861 Other chronic pancreatitis: Secondary | ICD-10-CM

## 2023-01-05 DIAGNOSIS — K859 Acute pancreatitis without necrosis or infection, unspecified: Secondary | ICD-10-CM | POA: Diagnosis not present

## 2023-01-05 DIAGNOSIS — R52 Pain, unspecified: Secondary | ICD-10-CM

## 2023-01-05 DIAGNOSIS — E1065 Type 1 diabetes mellitus with hyperglycemia: Secondary | ICD-10-CM | POA: Diagnosis not present

## 2023-01-05 DIAGNOSIS — G8929 Other chronic pain: Secondary | ICD-10-CM

## 2023-01-05 DIAGNOSIS — Z7901 Long term (current) use of anticoagulants: Secondary | ICD-10-CM

## 2023-01-05 DIAGNOSIS — R1013 Epigastric pain: Secondary | ICD-10-CM | POA: Diagnosis not present

## 2023-01-05 DIAGNOSIS — F1020 Alcohol dependence, uncomplicated: Secondary | ICD-10-CM

## 2023-01-05 DIAGNOSIS — F1190 Opioid use, unspecified, uncomplicated: Secondary | ICD-10-CM

## 2023-01-05 NOTE — Progress Notes (Signed)
Provider:  Alain Honey, MD  Careteam: Patient Care Team: [name], MD as PCP - General (Family Medicine) Jodi Marble, MD as Consulting Physician (Otolaryngology)  PLACE OF SERVICE:  [facility] Direct[facility] information    Allergies  Allergen Reactions   Other Anaphylaxis    [name] - anaphylaxis    Chief Complaint  Patient presents with   Medical Management of Chronic Issues    Patient presents today for a 2 month follow-up   Quality Metric Gaps    Eye exam, TDAP, COVID#1     HPI: Patient is a 37 y.o. male patient had another stent placed in pancreatic duct.  The celiac block helped for about a week but he still continues to have pain.  Dilaudid makes pain tolerable but seems like it wears off before next dose so my thought would be to move doses closer together but keep dosage same.  There was some confusion about drugs and that his GI doctor called in tramadol.  Would not really expect that to be effective given the fact that he is on Dilaudid.  Reminded again of terms of drug contract, that he should only get prescriptions like that from me.  He apologized and voiced understanding Review of Systems:  Review of Systems  Constitutional: Negative.   Respiratory: Negative.    Cardiovascular: Negative.   Gastrointestinal:  Positive for abdominal pain.  Genitourinary: Negative.   Neurological: Negative.   Psychiatric/Behavioral: Negative.    All other systems reviewed and are negative.   Past Medical History:  Diagnosis Date   AKI (acute kidney injury) ([place]) 12/21/2021   Anxiety    Diabetes ([place])    Pancreatitis    Splenic vein thrombosis    Past Surgical History:  Procedure Laterality Date   BILIARY DILATION  12/01/2022   Procedure: BILIARY DILATION;  Surgeon: Rush Landmark Telford Nab., MD;  Location[facility];  Service: Gastroenterology;;   BIOPSY  12/02/2021   Procedure: BIOPSY;  Surgeon: Irving Copas., MD;  Location: [facility];  Service: Gastroenterology;;   ENDOSCOPIC RETROGRADE CHOLANGIOPANCREATOGRAPHY (ERCP) WITH PROPOFOL N/A 08/12/2022   Procedure: ENDOSCOPIC RETROGRADE CHOLANGIOPANCREATOGRAPHY (ERCP) WITH PROPOFOL;  Surgeon: Irving Copas., MD;  Location: [facility];  Service: Gastroenterology;  Laterality: N/A;   ENDOSCOPIC RETROGRADE CHOLANGIOPANCREATOGRAPHY (ERCP) WITH PROPOFOL N/A 12/01/2022   Procedure: ENDOSCOPIC RETROGRADE CHOLANGIOPANCREATOGRAPHY (ERCP) WITH PROPOFOL;  Surgeon: Rush Landmark Telford Nab., MD;  Location: [facility];  Service: Gastroenterology;  Laterality: N/A;   ESOPHAGOGASTRODUODENOSCOPY N/A 12/01/2022   Procedure: ESOPHAGOGASTRODUODENOSCOPY (EGD);  Surgeon: Irving Copas., MD;  Location: [facility];  Service: Gastroenterology;  Laterality: N/A;   ESOPHAGOGASTRODUODENOSCOPY (EGD) WITH PROPOFOL N/A 12/02/2021   Procedure: ESOPHAGOGASTRODUODENOSCOPY (EGD) WITH PROPOFOL;  Surgeon: Rush Landmark Telford Nab., MD;  Location: [facility];  Service: Gastroenterology;  Laterality: N/A;   FINE NEEDLE ASPIRATION N/A 12/01/2022   Procedure: FINE NEEDLE ASPIRATION (FNA) LINEAR;  Surgeon: Irving Copas., MD;  Location: [facility];  Service: Gastroenterology;  Laterality: N/A;   NEUROLYTIC CELIAC PLEXUS  12/01/2022   Procedure: CELIAC PLEXUS BLOCK;  Surgeon: Rush Landmark Telford Nab., MD;  Location: [facility];  Service: Gastroenterology;;   NO PAST SURGERIES     PANCREATIC STENT PLACEMENT  08/12/2022   Procedure: PANCREATIC STENT PLACEMENT;  Surgeon: Irving Copas., MD;  Location: [facility];  Service: Gastroenterology;;   PANCREATIC STENT PLACEMENT  12/01/2022   Procedure: PANCREATIC STENT PLACEMENT;  Surgeon: Irving Copas., MD;  Location: [facility];  Service: Gastroenterology;;   REMOVAL  OF STONES  12/01/2022   Procedure: REMOVAL OF STONES;  Surgeon: Rush Landmark Telford Nab., MD;  Location: [facility];  Service: Gastroenterology;;    Joan Mayans  08/12/2022   Procedure: Joan Mayans;  Surgeon: Rush Landmark Telford Nab., MD;  Location: [facility];  Service: Gastroenterology;;   Joan Mayans  12/01/2022   Procedure: Joan Mayans;  Surgeon: Rush Landmark Telford Nab., MD;  Location: [facility];  Service: Gastroenterology;;   Lavell Islam REMOVAL  12/01/2022   Procedure: STENT REMOVAL;  Surgeon: Irving Copas., MD;  Location: [facility];  Service: Gastroenterology;;   UPPER ESOPHAGEAL ENDOSCOPIC ULTRASOUND (EUS) N/A 12/02/2021   Procedure: UPPER ESOPHAGEAL ENDOSCOPIC ULTRASOUND (EUS);  Surgeon: Irving Copas., MD;  Location: [facility];  Service: Gastroenterology;  Laterality: N/A;   UPPER ESOPHAGEAL ENDOSCOPIC ULTRASOUND (EUS) N/A 12/01/2022   Procedure: UPPER ESOPHAGEAL ENDOSCOPIC ULTRASOUND (EUS);  Surgeon: Irving Copas., MD;  Location: [facility];  Service: Gastroenterology;  Laterality: N/A;   Social History:   reports that he has been smoking cigars. He started smoking about 20 years ago. He has never used smokeless tobacco. He reports that he does not currently use alcohol. He reports that he does not use drugs.  Family History  Problem Relation Age of Onset   Hypertension Mother    Alcoholism Maternal Uncle    Diabetes Maternal Grandmother    Diabetes Maternal Grandfather    Diabetes Paternal Grandmother    Diabetes Paternal Grandfather    Colon cancer Neg Hx    Esophageal cancer Neg Hx    Inflammatory bowel disease Neg Hx    Liver disease Neg Hx    Pancreatic cancer Neg Hx    Rectal cancer Neg Hx    Stomach cancer Neg Hx     Medications: Patient's Medications  New Prescriptions   No medications on file  Previous Medications   ACETAMINOPHEN (TYLENOL) 325 MG TABLET    Take 2 tablets (650 mg total) by mouth every 6 (six) hours as needed for mild pain (or Fever >/= 101).   CONTINUOUS BLOOD GLUC SENSOR (DEXCOM G6 SENSOR) MISC    1 Device by Other route as directed. Change sensors  every 10 days   CONTINUOUS BLOOD GLUC TRANSMIT (DEXCOM G6 TRANSMITTER) MISC    1 Device by Does not apply route as directed.   DIPHENHYDRAMINE-ACETAMINOPHEN (TYLENOL PM) 25-500 MG TABS TABLET    Take 2 tablets by mouth at bedtime as needed (sleep, pain).   INSULIN ASPART (NOVOLOG) 100 UNIT/ML INJECTION    60 units a day   INSULIN DISPOSABLE PUMP (OMNIPOD 5 G6 POD, GEN 5,) MISC    1 Device by Does not apply route every 3 (three) days.   INSULIN GLARGINE (LANTUS SOLOSTAR) 100 UNIT/ML SOLOSTAR PEN    Inject 16 Units into the skin daily.   INSULIN PEN NEEDLE 32G X 4 MM MISC    1 Device by Does not apply route daily in the afternoon.   LIPASE/PROTEASE/AMYLASE (CREON) 36000 UNITS CPEP CAPSULE    Take 2 capsules (72,000 Units total) by mouth 3 (three) times daily before meals.   OMEPRAZOLE (PRILOSEC) 40 MG CAPSULE    Take 1 capsule (40 mg total) by mouth daily.   ONDANSETRON (ZOFRAN) 8 MG TABLET    Take 1 tablet (8 mg total) by mouth every 8 (eight) hours as needed for nausea or vomiting.  Modified Medications   No medications on file  Discontinued Medications   No medications on file    Physical Exam:  Vitals:   01/05/23  1000  BP: 126/80  Pulse: (!) 110  Temp: 97.8 F (36.6 C)  SpO2: 97%  Weight: 161 lb (73 kg)  Height: '6\' 5"'$  (1.956 m)   Body mass index is 19.09 kg/m. Wt Readings from Last 3 Encounters:  01/05/23 161 lb (73 kg)  12/01/22 160 lb (72.6 kg)  11/17/22 155 lb (70.3 kg)    Physical Exam Vitals and nursing note reviewed.  Constitutional:      Appearance: Normal appearance.  Cardiovascular:     Rate and Rhythm: Normal rate and regular rhythm.  Pulmonary:     Effort: Pulmonary effort is normal.     Breath sounds: Normal breath sounds.  Abdominal:     General: Bowel sounds are normal. There is no distension.     Palpations: Abdomen is soft.     Tenderness: There is no abdominal tenderness. There is no right CVA tenderness or guarding.  Skin:    General: Skin is  warm.  Neurological:     Mental Status: He is alert.     Labs reviewed: Basic Metabolic Panel: Recent Labs    03/10/22 1511 04/23/22 1254 07/22/22 0541 08/02/22 0914 08/15/22 0328 08/16/22 0316 08/18/22 0318 10/12/22 1631 11/10/22 2303 11/11/22 1144 11/12/22 0443  NA  --    < > 138   < > 135 136 136  --  134* 134* 137  K  --    < > 4.4   < > 3.5 3.7 4.1  --  3.6 3.6 3.4*  CL  --    < > 99   < > 102 102 102  --  102 100 104  CO2  --    < > 30   < > '24 27 28  '$ --  19* 24 25  GLUCOSE  --    < > 148*   < > 188* 252* 204*  --  193* 172* 85  BUN  --    < > <5*   < > '8 6 8  '$ --  '8 10 12  '$ CREATININE  --    < > 0.93   < > 0.70 0.82 0.80   < > 0.81 0.83 0.75  CALCIUM  --    < > 9.9   < > 8.4* 8.5* 8.9  --  9.2 9.1 8.6*  MG  --    < > 1.9  --  1.7 1.7 1.7  --   --   --   --   PHOS  --   --  4.0  --   --   --   --   --   --   --   --   TSH 0.81  --   --   --   --   --   --   --   --   --   --    < > = values in this interval not displayed.   Liver Function Tests: Recent Labs    08/18/22 0318 11/10/22 2303 11/12/22 0443  AST 12* 21 16  ALT '12 19 14  '$ ALKPHOS 72 80 57  BILITOT 0.4 1.0 0.8  PROT 6.8 7.5 6.1*  ALBUMIN 3.5 4.2 3.3*   Recent Labs    03/05/22 1239 04/23/22 1254 08/13/22 1934 08/18/22 0318 11/10/22 2303  LIPASE 56.0   < > 40 42 66*  AMYLASE 73  --   --   --   --    < > = values in this interval  not displayed.   No results for input(s): "AMMONIA" in the last 8760 hours. CBC: Recent Labs    08/02/22 0914 08/13/22 1934 08/15/22 0328 08/18/22 0318 11/10/22 2303 11/12/22 0443  WBC 5.5 6.3   < > 3.9* 5.7 4.6  NEUTROABS 3,410 4.6  --   --  3.6  --   HGB 13.1* 14.1   < > 12.2* 14.8 13.7  HCT 37.8* 42.2   < > 37.3* 44.3 41.2  MCV 97.4 98.6   < > 101.4* 104.2* 105.4*  PLT 230 187   < > 174 222 178   < > = values in this interval not displayed.   Lipid Panel: Recent Labs    03/10/22 1511 04/27/22 0953 07/20/22 0215  CHOL 104  --   --   HDL 56.50  --    --   LDLCALC 18  --   --   TRIG 149.0 90 153*  CHOLHDL 2  --   --    TSH: Recent Labs    03/10/22 1511  TSH 0.81   A1C: Lab Results  Component Value Date   HGBA1C 8.4 (H) 11/03/2022     Assessment/Plan  1. Opioid use Continued use of Dilaudid as noted in HPI  2. Type 1 diabetes mellitus with hyperglycemia (HCC) On insulin pump.  Previous A1c's were good  3. Abdominal pain, chronic, epigastric Due to chronic pancreatitis 4. Acute on chronic pancreatitis (HCC) Pain continues.  Will adjust analgesic as above.  Another celiac block may be considered  5. Uncomplicated alcohol dependence ([place]) By history, patient is no longer drinking  6. 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

## 2023-01-06 LAB — MICROALBUMIN / CREATININE URINE RATIO
Creatinine, Urine: 137 mg/dL (ref 20–320)
Microalb Creat Ratio: 61 mcg/mg creat — ABNORMAL HIGH (ref <30)
Microalb, Ur: 8.4 mg/dL

## 2023-01-07 LAB — DRUG MONITOR, PANEL 1, W/CONF, URINE
Amphetamines: NEGATIVE ng/mL (ref <500)
Barbiturates: NEGATIVE ng/mL (ref <300)
Benzodiazepines: NEGATIVE ng/mL (ref <100)
Cocaine Metabolite: NEGATIVE ng/mL (ref <150)
Codeine: NEGATIVE ng/mL (ref <50)
Creatinine: 131.2 mg/dL (ref >=20.0)
Hydrocodone: NEGATIVE ng/mL (ref <50)
Hydromorphone: >10000 ng/mL — ABNORMAL HIGH (ref <50)
Marijuana Metabolite: NEGATIVE ng/mL (ref <20)
Methadone Metabolite: NEGATIVE ng/mL (ref <100)
Morphine: NEGATIVE ng/mL (ref <50)
Norhydrocodone: NEGATIVE ng/mL (ref <50)
Opiates: POSITIVE ng/mL — AB (ref <100)
Oxidant: NEGATIVE ug/mL (ref <200)
Oxycodone: NEGATIVE ng/mL (ref <100)
Phencyclidine: NEGATIVE ng/mL (ref <25)
pH: 5.8 (ref 4.5–9.0)

## 2023-01-07 LAB — DM TEMPLATE

## 2023-01-18 ENCOUNTER — Other Ambulatory Visit: Payer: Self-pay

## 2023-01-18 MED ORDER — HYDROMORPHONE HCL 4 MG PO TABS: 4.0000 mg | ORAL_TABLET | ORAL | 0 refills | Status: DC | PRN

## 2023-01-18 NOTE — Telephone Encounter (Signed)
Patient called requesting refill on hydromorphone. Medication last filled 01/04/2023,but not on active medication list. Patient has up to date treatment agreement on file.   Medication pended and routed to Dr. Alain Honey

## 2023-01-19 ENCOUNTER — Ambulatory Visit: Payer: Medicaid Other | Admitting: Family Medicine

## 2023-01-20 IMAGING — MR MR ABDOMEN WO/W CM MRCP
16 of 21 series · 37 of 48 positions shown · IV contrast (GADAVIST)
Comparison: CT abdomen and pelvis 04/23/2022

CLINICAL DATA: Pancreatitis, abdominal pain

EXAM:
MRI ABDOMEN WITHOUT AND WITH CONTRAST (INCLUDING MRCP)
TECHNIQUE: Multiplanar multisequence MR imaging of the abdomen was performed
both before and after the administration of intravenous contrast.
Heavily T2-weighted images of the biliary and pancreatic ducts were
obtained, and three-dimensional MRCP images were rendered by post
processing.
CONTRAST:  7mL GADAVIST GADOBUTROL 1 MMOL/ML IV SOLN

[Series 4: T2 fat-sat · axial · 6.0mm · 1.14mm/px · 1 of 34 slices shown]
[im 1/34]
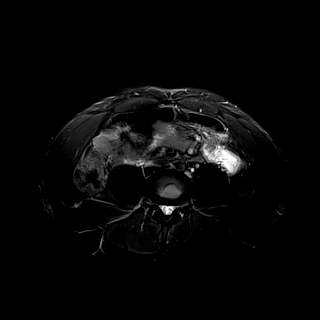

[Series 6: T2 · coronal · 6.0mm · 1.48mm/px · 1 of 30 slices shown (1 of 2)]
[im 1/30]
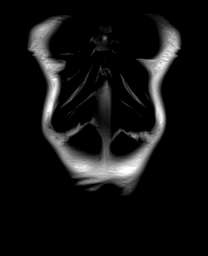

[Series 7: DWI · axial · 6.0mm · 1.36mm/px · z∈[-124,+128]mm · 2 of 72 slices shown (1 of 2)]
[im 1/72]
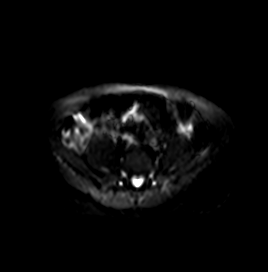
[im 72/72]
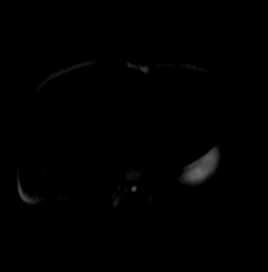

[Series 8: DWI · axial · 6.0mm · 1.36mm/px · 1 of 36 slices shown (2 of 2)]
[im 1/36]
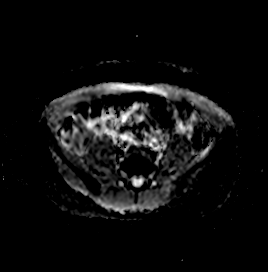

[Series 9: T1 · axial · 3.0mm · 1.25mm/px · z∈[-130,+130]mm · 3 of 88 slices shown (1 of 2)]
[im 1/88]
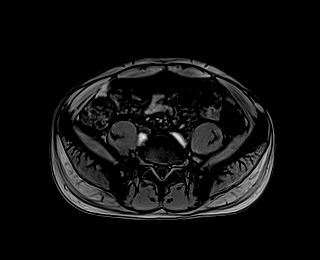
[im 44/88]
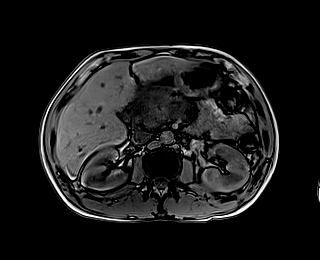
[im 88/88]
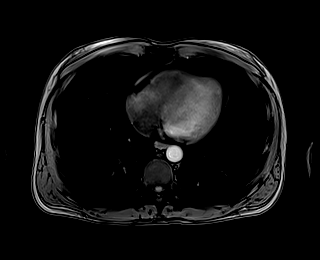

[Series 10: T1 · axial · 3.0mm · 1.25mm/px · z∈[-130,+130]mm · 3 of 88 slices shown (2 of 2)]
[im 1/88]
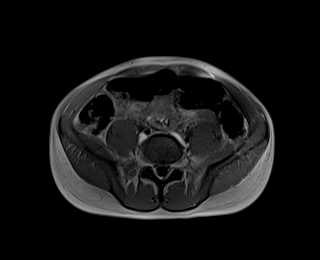
[im 44/88]
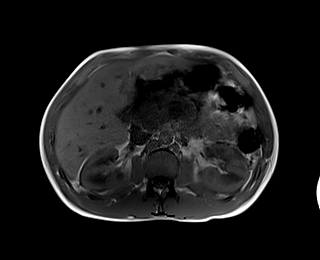
[im 88/88]
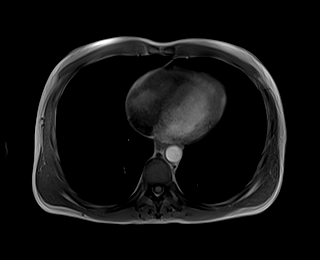

[Series 11: cor obl thk · sagittal · 50.0mm · 0.78mm/px · 1 of 9 slices shown]
[im 1/9]
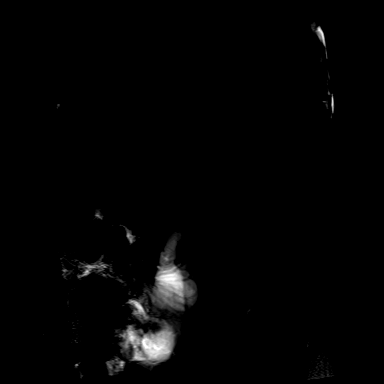

[Series 13: cor_3d_spc_trig · coronal · 1.0mm · 0.49mm/px · 3 of 72 slices shown]
[im 1/72]
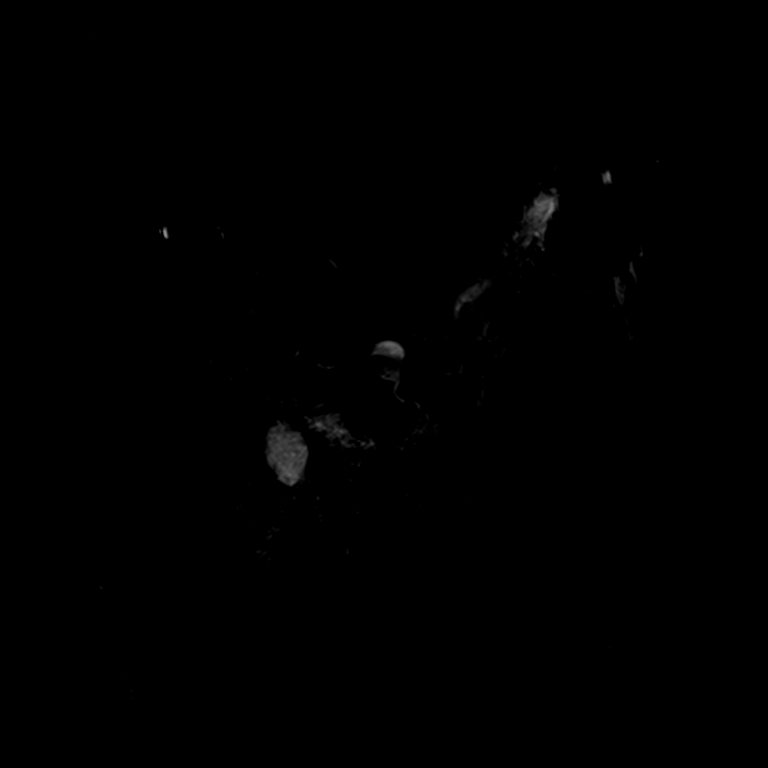
[im 36/72]
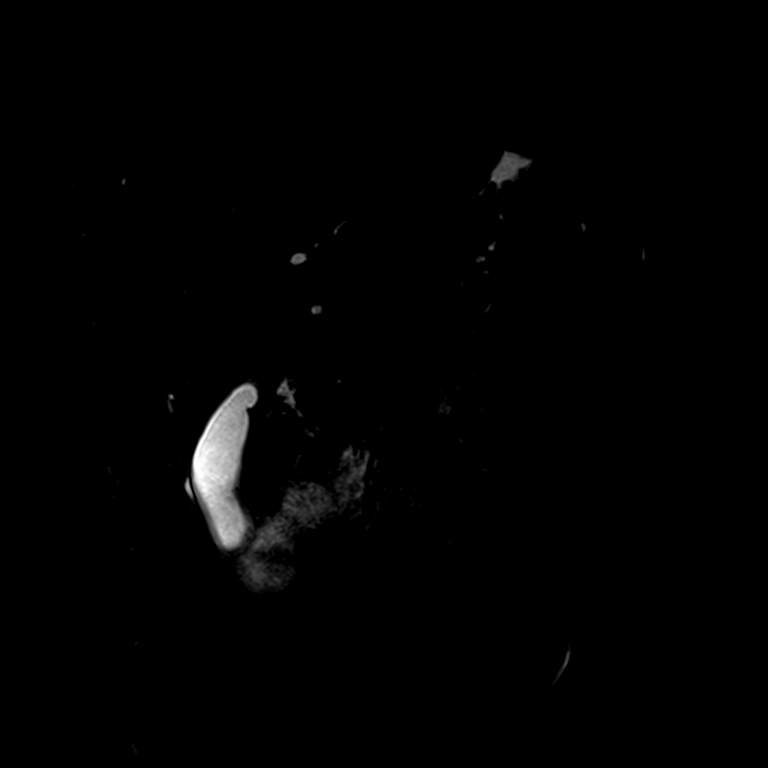
[im 72/72]
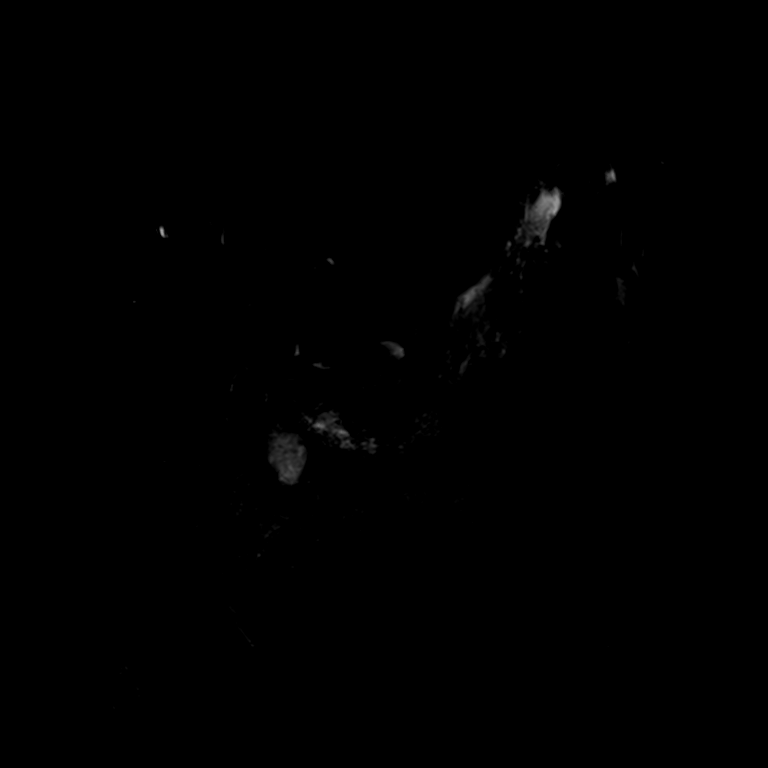

[Series 15: T2 · axial · 6.0mm · 1.41mm/px · 1 of 36 slices shown (2 of 2)]
[im 1/36]
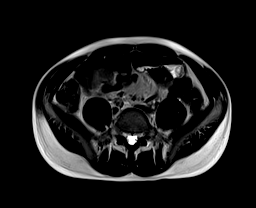

[Series 17: T1 dynamic · axial · 3.0mm · 1.25mm/px · z∈[-131,+130]mm · 3 of 88 slices shown (1 of 7)]
[im 1/88]
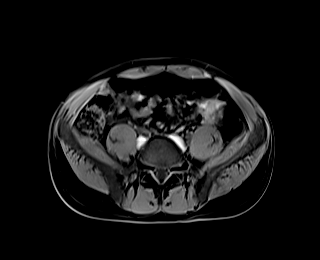
[im 44/88]
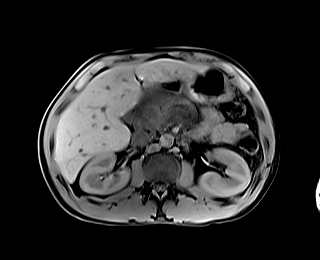
[im 88/88]
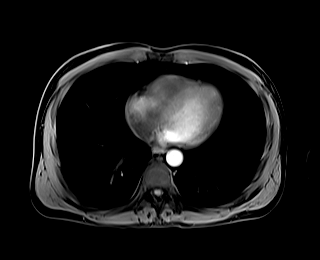

[Series 21: T1 dynamic · axial · 3.0mm · 1.25mm/px · z∈[-131,+130]mm · 3 of 88 slices shown (2 of 7)]
[im 1/88]
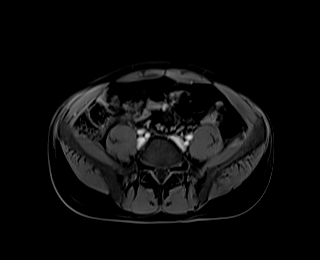
[im 44/88]
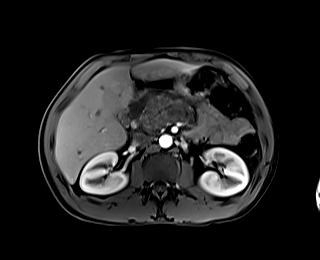
[im 88/88]
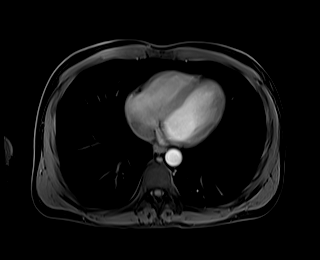

[Series 22: T1 dynamic · axial · 3.0mm · 1.25mm/px · z∈[-131,+130]mm · 3 of 88 slices shown (3 of 7)]
[im 1/88]
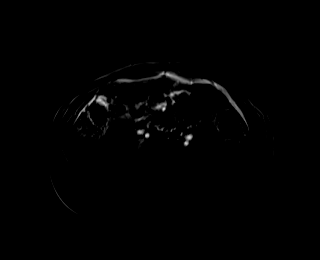
[im 44/88]
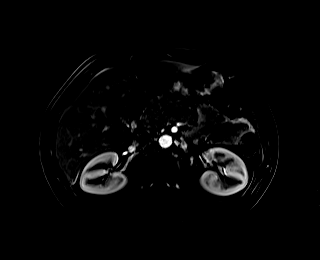
[im 88/88]
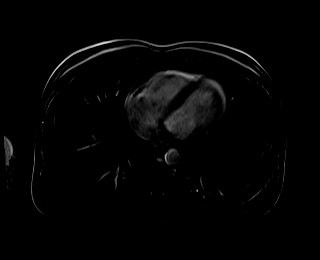

[Series 25: T1 dynamic · axial · 3.0mm · 1.25mm/px · z∈[-131,+130]mm · 3 of 88 slices shown (4 of 7)]
[im 1/88]
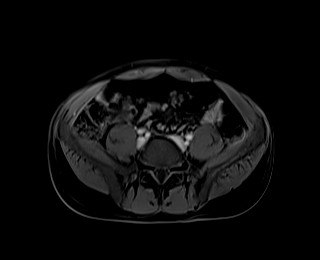
[im 44/88]
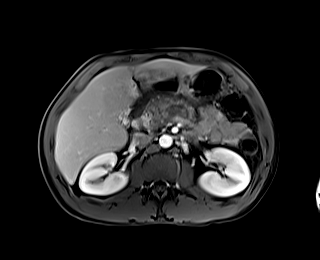
[im 88/88]
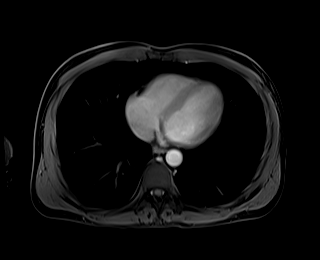

[Series 26: T1 dynamic · axial · 3.0mm · 1.25mm/px · z∈[-131,+130]mm · 3 of 88 slices shown (5 of 7)]
[im 1/88]
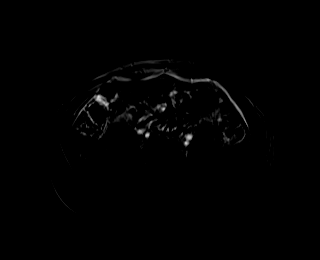
[im 44/88]
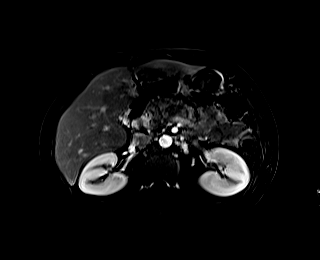
[im 88/88]
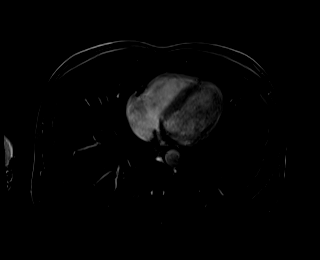

[Series 29: T1 dynamic · axial · 3.0mm · 1.25mm/px · z∈[-131,+130]mm · 3 of 88 slices shown (6 of 7)]
[im 1/88]
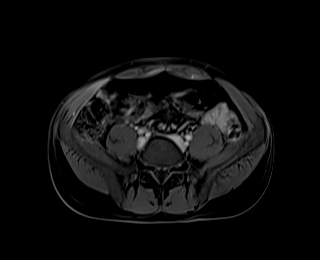
[im 44/88]
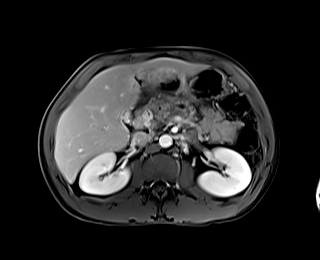
[im 88/88]
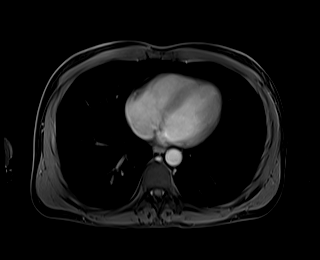

[Series 30: T1 dynamic · axial · 3.0mm · 1.25mm/px · z∈[-131,+130]mm · 3 of 88 slices shown (7 of 7)]
[im 1/88]
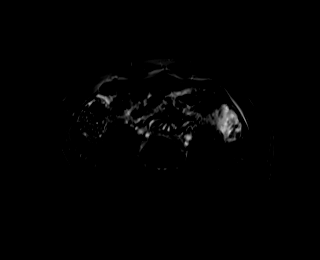
[im 44/88]
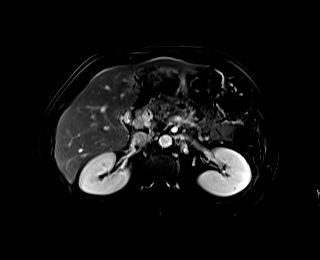
[im 88/88]
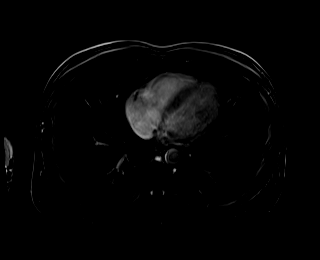

[37 of 48 positions shown; findings below may reference images not displayed]

FINDINGS: Lower chest: No pleural effusions.

Hepatobiliary: Liver is enlarged measuring 20.1 cm in length. No
definite evidence of hepatic steatosis. No suspicious hepatic mass
identified. Gallbladder appears within normal limits. There is
narrowing and subtle irregularity of the common bile duct with
minimal intrahepatic ductal dilatation.

Pancreas: Diffusely heterogeneous and edematous appearance,
including evidence of multiple small pancreatic calcifications.
Pancreatic duct is dilated throughout measuring up to 7 mm in
diameter. Heterogeneous cystic lesion in the tail the pancreas
measures up to 1.9 cm in size.

Spleen:  Upper normal size.

Adrenals/Urinary Tract: No masses identified. No evidence of
hydronephrosis.

Stomach/Bowel: No evidence of bowel obstruction.

Vascular/Lymphatic: Extensive tortuous vascularity in the portacaval
region consistent with previous cavernous transformation. The
intrahepatic portal veins are grossly patent with mildly diminished
caliber and irregularity.

Other:  Trace ascites.

Musculoskeletal: No suspicious bone lesions identified.
IMPRESSION: 1. Evidence of acute on chronic pancreatitis. Pancreatic ductal
dilatation measuring up to 7 mm. Chronic 1.9 cm heterogeneous cystic
lesion in the tail the pancreas, likely a pseudocyst.
2. Narrowing and irregularity of the common bile duct which is
likely secondary to surrounding pancreatic inflammatory changes and
may be acute or chronic. Minimal likely associated intrahepatic
ductal dilatation.
3. Hepatomegaly.
4. Cavernous transformation of the portal vein.
5. Trace ascites.

## 2023-01-21 ENCOUNTER — Other Ambulatory Visit: Payer: Self-pay | Admitting: Family Medicine

## 2023-01-23 ENCOUNTER — Other Ambulatory Visit: Payer: Self-pay | Admitting: Family Medicine

## 2023-01-24 ENCOUNTER — Other Ambulatory Visit: Payer: Self-pay | Admitting: Family Medicine

## 2023-01-25 ENCOUNTER — Encounter: Payer: Self-pay | Admitting: Gastroenterology

## 2023-01-25 ENCOUNTER — Other Ambulatory Visit: Payer: Self-pay | Admitting: Family Medicine

## 2023-01-25 ENCOUNTER — Ambulatory Visit (INDEPENDENT_AMBULATORY_CARE_PROVIDER_SITE_OTHER): Payer: Medicaid Other | Admitting: Gastroenterology

## 2023-01-25 ENCOUNTER — Ambulatory Visit (INDEPENDENT_AMBULATORY_CARE_PROVIDER_SITE_OTHER): Payer: Medicaid Other | Admitting: Family

## 2023-01-25 VITALS — BP 128/80 | HR 82 | Temp 98.2°F | Resp 16 | Ht 77.0 in | Wt 162.8 lb

## 2023-01-25 VITALS — BP 130/88 | HR 94 | Ht 77.0 in | Wt 164.0 lb

## 2023-01-25 DIAGNOSIS — G8929 Other chronic pain: Secondary | ICD-10-CM

## 2023-01-25 DIAGNOSIS — K8689 Other specified diseases of pancreas: Secondary | ICD-10-CM | POA: Diagnosis not present

## 2023-01-25 DIAGNOSIS — R1084 Generalized abdominal pain: Secondary | ICD-10-CM

## 2023-01-25 DIAGNOSIS — R1013 Epigastric pain: Secondary | ICD-10-CM

## 2023-01-25 DIAGNOSIS — K861 Other chronic pancreatitis: Secondary | ICD-10-CM | POA: Diagnosis not present

## 2023-01-25 MED ORDER — HYDROMORPHONE HCL 4 MG PO TABS: 4.0000 mg | ORAL_TABLET | ORAL | 0 refills | Status: DC | PRN

## 2023-01-25 MED ORDER — TRAMADOL HCL 50 MG PO TABS: 50.0000 mg | ORAL_TABLET | Freq: Four times a day (QID) | ORAL | 0 refills | Status: DC | PRN

## 2023-01-25 NOTE — Telephone Encounter (Signed)
Patient requested refill through MyChart.  Epic LR: 01/18/2023 Contract Date: 01/05/2023  Pended Rx and sent to Dr. Sabra Heck for approval.

## 2023-01-25 NOTE — Patient Instructions (Addendum)
We have sent the following medications to your pharmacy for you to pick up at your convenience: Tramadol   Dr.[name] will reach out to your Physician to discuss the reasoning for sending the prescription for Tramadol.   You have been scheduled for an endoscopy. Please follow written instructions given to you at your visit today. If you use inhalers (even only as needed), please bring them with you on the day of your procedure.  Due to recent changes in healthcare laws, you may see the results of your imaging and laboratory studies on MyChart before your provider has had a chance to review them.  We understand that in some cases there may be results that are confusing or concerning to you. Not all laboratory results come back in the same time frame and the provider may be waiting for multiple results in order to interpret others.  Please give [place] 48 hours in order for your provider to thoroughly review all the results before contacting the office for clarification of your results.   _______________________________________________________  If your blood pressure at your visit was 140/90 or greater, please contact your primary care physician to follow up on this.  _______________________________________________________  If you are age 37 or older, your body mass index should be between 23-30. Your Body mass index is 19.45 kg/m. If this is out of the aforementioned range listed, please consider follow up with your Primary Care Provider.  If you are age 18 or younger, your body mass index should be between 19-25. Your Body mass index is 19.45 kg/m. If this is out of the aformentioned range listed, please consider follow up with your Primary Care Provider.   ________________________________________________________  The [facility] providers would like to encourage you to use [facility] to communicate with providers for non-urgent requests or questions.  Due to long hold times on the telephone,  sending your provider a message by [facility] may be a faster and more efficient way to get a response.  Please allow 48 business hours for a response.  Please remember that this is for non-urgent requests.  _______________________________________________________  Thank you for choosing me and [facility].  Dr. Rush Landmark

## 2023-01-25 NOTE — Progress Notes (Signed)
Provider: Remigio Mcmillon FNP-C  [name], MD  Patient Care Team: [name], MD as PCP - General (Family Medicine) Jodi Marble, MD as Consulting Physician (Otolaryngology)  Extended Emergency Contact Information Primary Emergency Contact: brathwaite,naketa Address: [place], [place] [address] Johnnette Litter of [place] Phone: [phone] Work Phone: [phone] Mobile Phone: [phone] Relation: Significant other  Code Status: Full code Goals of care: Advanced Directive information    12/01/2022    7:56 AM  Advanced Directives  Does Patient Have a Medical Advance Directive? No     Chief Complaint  Patient presents with   Medication Management    Hydromorpone sent in but on backorder. Gastro told him to see if we can get it changed to something else they have in stock.    HPI:  Pt is a 37 y.o. male seen today for an acute visit for Hydromorphone refill.states medication was refilled by PCP Dr.Miller but pharmacy told him medication was out of stock on back up order.He was seen by Gastroenterology who advised patient to follow up with PCP to see if pain meds can be switched to another medication until his pharmacy gets medication delivered.  Discussed with patient [facility] and [facility] told [facility] 4 mg tablet was available.  Patient scheduled for upcoming  ERCP procedure on 02/02/2023.   Past Medical History:  Diagnosis Date   AKI (acute kidney injury) ([place]) 12/21/2021   Anxiety    Diabetes ([place])    Pancreatitis    Splenic vein thrombosis    Past Surgical History:  Procedure Laterality Date   BILIARY DILATION  12/01/2022   Procedure: BILIARY DILATION;  Surgeon: Rush Landmark Telford Nab., MD;  Location: [facility];  Service: Gastroenterology;;   BIOPSY  12/02/2021   Procedure: BIOPSY;  Surgeon: Irving Copas., MD;  Location: [facility];   Service: Gastroenterology;;   ENDOSCOPIC RETROGRADE CHOLANGIOPANCREATOGRAPHY (ERCP) WITH PROPOFOL N/A 08/12/2022   Procedure: ENDOSCOPIC RETROGRADE CHOLANGIOPANCREATOGRAPHY (ERCP) WITH PROPOFOL;  Surgeon: Irving Copas., MD;  Location: [facility];  Service: Gastroenterology;  Laterality: N/A;   ENDOSCOPIC RETROGRADE CHOLANGIOPANCREATOGRAPHY (ERCP) WITH PROPOFOL N/A 12/01/2022   Procedure: ENDOSCOPIC RETROGRADE CHOLANGIOPANCREATOGRAPHY (ERCP) WITH PROPOFOL;  Surgeon: Rush Landmark Telford Nab., MD;  Location: [facility];  Service: Gastroenterology;  Laterality: N/A;   ESOPHAGOGASTRODUODENOSCOPY N/A 12/01/2022   Procedure: ESOPHAGOGASTRODUODENOSCOPY (EGD);  Surgeon: Irving Copas., MD;  Location: [facility];  Service: Gastroenterology;  Laterality: N/A;   ESOPHAGOGASTRODUODENOSCOPY (EGD) WITH PROPOFOL N/A 12/02/2021   Procedure: ESOPHAGOGASTRODUODENOSCOPY (EGD) WITH PROPOFOL;  Surgeon: Rush Landmark Telford Nab., MD;  Location: [facility];  Service: Gastroenterology;  Laterality: N/A;   FINE NEEDLE ASPIRATION N/A 12/01/2022   Procedure: FINE NEEDLE ASPIRATION (FNA) LINEAR;  Surgeon: Irving Copas., MD;  Location: [facility];  Service: Gastroenterology;  Laterality: N/A;   NEUROLYTIC CELIAC PLEXUS  12/01/2022   Procedure: CELIAC PLEXUS BLOCK;  Surgeon: Rush Landmark Telford Nab., MD;  Location: [facility];  Service: Gastroenterology;;   NO PAST SURGERIES     PANCREATIC STENT PLACEMENT  08/12/2022   Procedure: PANCREATIC STENT PLACEMENT;  Surgeon: Irving Copas., MD;  Location: [facility];  Service: Gastroenterology;;   PANCREATIC STENT PLACEMENT  12/01/2022   Procedure: PANCREATIC STENT PLACEMENT;  Surgeon: Irving Copas., MD;  Location: [facility];  Service: Gastroenterology;;   REMOVAL OF STONES  12/01/2022   Procedure: REMOVAL OF STONES;  Surgeon: Irving Copas.,  MD;  Location: [facility];  Service: Gastroenterology;;   Joan Mayans   08/12/2022   Procedure: Joan Mayans;  Surgeon: Irving Copas., MD;  Location: [facility];  Service: Gastroenterology;;   Joan Mayans  12/01/2022   Procedure: Joan Mayans;  Surgeon: Rush Landmark Telford Nab., MD;  Location: [facility];  Service: Gastroenterology;;   Lavell Islam REMOVAL  12/01/2022   Procedure: STENT REMOVAL;  Surgeon: Irving Copas., MD;  Location: [facility];  Service: Gastroenterology;;   UPPER ESOPHAGEAL ENDOSCOPIC ULTRASOUND (EUS) N/A 12/02/2021   Procedure: UPPER ESOPHAGEAL ENDOSCOPIC ULTRASOUND (EUS);  Surgeon: Irving Copas., MD;  Location: [facility];  Service: Gastroenterology;  Laterality: N/A;   UPPER ESOPHAGEAL ENDOSCOPIC ULTRASOUND (EUS) N/A 12/01/2022   Procedure: UPPER ESOPHAGEAL ENDOSCOPIC ULTRASOUND (EUS);  Surgeon: Irving Copas., MD;  Location: [facility];  Service: Gastroenterology;  Laterality: N/A;    Allergies  Allergen Reactions   Other Anaphylaxis    [name] - anaphylaxis    Outpatient Encounter Medications as of 01/25/2023  Medication Sig   acetaminophen (TYLENOL) 325 MG tablet Take 2 tablets (650 mg total) by mouth every 6 (six) hours as needed for mild pain (or Fever >/= 101).   Continuous Blood Gluc Sensor (DEXCOM G6 SENSOR) MISC 1 Device by Other route as directed. Change sensors every 10 days   Continuous Blood Gluc Transmit (DEXCOM G6 TRANSMITTER) MISC 1 Device by Does not apply route as directed.   diphenhydramine-acetaminophen (TYLENOL PM) 25-500 MG TABS tablet Take 2 tablets by mouth at bedtime as needed (sleep, pain).   insulin aspart (NOVOLOG) 100 UNIT/ML injection 60 units a day   Insulin Disposable Pump (OMNIPOD 5 G6 POD, GEN 5,) MISC 1 Device by Does not apply route every 3 (three) days. (Patient taking differently: 1 Device by Does not apply route every 3 (three) days. Depending on meals)   insulin glargine (LANTUS SOLOSTAR) 100 UNIT/ML Solostar Pen Inject 16 Units into the skin daily.  (Patient taking differently: Inject 16 Units into the skin daily as needed (if don't have pump).)   Insulin Pen Needle 32G X 4 MM MISC 1 Device by Does not apply route daily in the afternoon.   lipase/protease/amylase (CREON) 36000 UNITS CPEP capsule Take 2 capsules (72,000 Units total) by mouth 3 (three) times daily before meals.   omeprazole (PRILOSEC) 40 MG capsule Take 1 capsule (40 mg total) by mouth daily.   ondansetron (ZOFRAN) 8 MG tablet Take 1 tablet (8 mg total) by mouth every 8 (eight) hours as needed for nausea or vomiting.   traMADol (ULTRAM) 50 MG tablet Take 1 tablet (50 mg total) by mouth every 6 (six) hours as needed.   [DISCONTINUED] HYDROmorphone (DILAUDID) 4 MG tablet Take 1 tablet (4 mg total) by mouth every 4 (four) hours as needed for severe pain.   HYDROmorphone (DILAUDID) 4 MG tablet Take 1 tablet (4 mg total) by mouth every 4 (four) hours as needed for severe pain.   No facility-administered encounter medications on file as of 01/25/2023.    Review of Systems  Constitutional:  Negative for appetite change, chills, fatigue, fever and unexpected weight change.  Eyes:  Negative for pain, discharge, redness, itching and visual disturbance.  Respiratory:  Negative for cough, chest tightness, shortness of breath and wheezing.   Cardiovascular:  Negative for chest pain, palpitations and leg swelling.  Gastrointestinal:  Positive for abdominal pain. Negative for abdominal distention, blood in stool, constipation, diarrhea, nausea and vomiting.  Musculoskeletal:  Negative for arthralgias, back pain and gait problem.  Skin:  Negative for color change, pallor and rash.  Neurological:  Negative for dizziness, weakness, light-headedness, numbness and headaches.    Immunization History  Administered Date(s) Administered   Influenza,inj,Quad PF,6+ Mos 09/15/2021   Pertinent  Health Maintenance Due  Topic Date Due   OPHTHALMOLOGY EXAM  Never done   HEMOGLOBIN A1C  05/05/2023    FOOT EXAM  11/04/2023   INFLUENZA VACCINE  Completed      11/10/2022   11:41 PM 11/11/2022   12:03 PM 11/11/2022    6:15 PM 11/11/2022    9:20 PM 11/12/2022    7:01 AM  Fall Risk  (RETIRED) Patient Fall Risk Level Low fall risk Low fall risk Low fall risk Low fall risk Low fall risk   Functional Status Survey:    Vitals:   01/25/23 1543  BP: 128/80  Pulse: 82  Resp: 16  Temp: 98.2 F (36.8 C)  TempSrc: Temporal  SpO2: 98%  Weight: 162 lb 12.8 oz (73.8 kg)  Height: 6\' 5"  (1.956 m)   Body mass index is 19.31 kg/m. Physical Exam Vitals reviewed.  Constitutional:      General: He is not in acute distress.    Appearance: Normal appearance. He is normal weight. He is not ill-appearing or diaphoretic.  HENT:     Head: Normocephalic.     Mouth/Throat:     Mouth: Mucous membranes are moist.     Pharynx: Oropharynx is clear. No oropharyngeal exudate or posterior oropharyngeal erythema.  Eyes:     General: No scleral icterus.       Right eye: No discharge.        Left eye: No discharge.     Extraocular Movements: Extraocular movements intact.     Conjunctiva/sclera: Conjunctivae normal.     Pupils: Pupils are equal, round, and reactive to light.  Cardiovascular:     Rate and Rhythm: Normal rate and regular rhythm.     Pulses: Normal pulses.     Heart sounds: Normal heart sounds. No murmur heard.    No friction rub. No gallop.  Pulmonary:     Effort: Pulmonary effort is normal. No respiratory distress.     Breath sounds: Normal breath sounds. No wheezing, rhonchi or rales.  Chest:     Chest wall: No tenderness.  Abdominal:     General: Bowel sounds are normal. There is no distension.     Palpations: Abdomen is soft. There is no mass.     Tenderness: There is abdominal tenderness in the epigastric area and left upper quadrant. There is no right CVA tenderness, left CVA tenderness, guarding or rebound.  Musculoskeletal:        General: No swelling or tenderness. Normal range  of motion.     Right lower leg: No edema.     Left lower leg: No edema.  Skin:    General: Skin is warm and dry.     Coloration: Skin is not pale.     Findings: No bruising, erythema, lesion or rash.  Neurological:     Mental Status: He is alert and oriented to person, place, and time.     Cranial Nerves: No cranial nerve deficit.     Motor: No weakness.     Gait: Gait normal.  Psychiatric:        Mood and Affect: Mood normal.        Speech: Speech normal.        Behavior: Behavior normal.     Labs  reviewed: Recent Labs    07/22/22 0541 08/02/22 0914 08/15/22 0328 08/16/22 0316 08/18/22 0318 10/12/22 1631 11/10/22 2303 11/11/22 1144 11/12/22 0443  NA 138   < > 135 136 136  --  134* 134* 137  K 4.4   < > 3.5 3.7 4.1  --  3.6 3.6 3.4*  CL 99   < > 102 102 102  --  102 100 104  CO2 30   < > 24 27 28   --  19* 24 25  GLUCOSE 148*   < > 188* 252* 204*  --  193* 172* 85  BUN <5*   < > 8 6 8   --  8 10 12   CREATININE 0.93   < > 0.70 0.82 0.80   < > 0.81 0.83 0.75  CALCIUM 9.9   < > 8.4* 8.5* 8.9  --  9.2 9.1 8.6*  MG 1.9  --  1.7 1.7 1.7  --   --   --   --   PHOS 4.0  --   --   --   --   --   --   --   --    < > = values in this interval not displayed.   Recent Labs    08/18/22 0318 11/10/22 2303 11/12/22 0443  AST 12* 21 16  ALT 12 19 14   ALKPHOS 72 80 57  BILITOT 0.4 1.0 0.8  PROT 6.8 7.5 6.1*  ALBUMIN 3.5 4.2 3.3*   Recent Labs    08/02/22 0914 08/13/22 1934 08/15/22 0328 08/18/22 0318 11/10/22 2303 11/12/22 0443  WBC 5.5 6.3   < > 3.9* 5.7 4.6  NEUTROABS 3,410 4.6  --   --  3.6  --   HGB 13.1* 14.1   < > 12.2* 14.8 13.7  HCT 37.8* 42.2   < > 37.3* 44.3 41.2  MCV 97.4 98.6   < > 101.4* 104.2* 105.4*  PLT 230 187   < > 174 222 178   < > = values in this interval not displayed.   Lab Results  Component Value Date   TSH 0.81 03/10/2022   Lab Results  Component Value Date   HGBA1C 8.4 (H) 11/03/2022   Lab Results  Component Value Date   CHOL 104  03/10/2022   HDL 56.50 03/10/2022   LDLCALC 18 03/10/2022   TRIG 153 (H) 07/20/2022   CHOLHDL 2 03/10/2022    Significant Diagnostic Results in last 30 days:  No results found.  Assessment/Plan  Abdominal pain, chronic, epigastric Afebrile  Epigastric and Upper quadrant tender to palpation.pain chronic. Hydromorphone on back up order in his regular pharmacy.[facility] called and was told medication was available.  - continue to follow up with GI as directed.Has upcoming ERCP procedure on 02/02/2023.  - HYDROmorphone (DILAUDID) 4 MG tablet; Take 1 tablet (4 mg total) by mouth every 4 (four) hours as needed for severe pain.  Dispense: 30 tablet; Refill: 0  Family/ staff Communication: Reviewed plan of care with patient verbalized understanding  Labs/tests ordered: None   Next Appointment: Return if symptoms worsen or fail to improve.   Sandrea Hughs, NP

## 2023-01-25 NOTE — Telephone Encounter (Signed)
Patient seen today in the office [facility] due to medication being on back up at his regular pharmacy.

## 2023-01-25 NOTE — H&P (View-Only) (Signed)
[facility]   Primary Care Provider [name], [place] [address] [phone]   Patient Profile: Carlos [name] is a 37 y.o. 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  The patient presents to the [facility] for an evaluation and management of problem(s) noted below:  Problem List 1. Chronic pancreatitis, unspecified pancreatitis type ([place])   2. Dilation of pancreatic duct   3. Stricture of pancreatic duct   4. Chronic generalized abdominal pain    History of Present Illness Please see prior note for full details of HPI.  Interval History The patient returns for scheduled follow-up.  The patient returns for follow-up.  I have not seen the patient since his EUS/ERCP back in January.  He does state that the celiac block was helpful but only for approximately 2 to 3 weeks and subsequently things have returned to being an issue.  He has had at least 1 significant 1 week worth of pain that almost brought him back to the hospital but he was able to manage that as an outpatient because he had to work.  The patient's weight has been overall stable.  He continues to take his Creon supplementation.  He continues to worry about what his long-term life is going to be like in regards to his chronic pain.  GI Review of Systems Positive as above Negative for dysphagia, odynophagia, melena, hematochezia   Review of Systems General: Denies fevers/chills/unintentional weight loss Cardiovascular: Denies chest pain Pulmonary: Denies shortness of breath Gastroenterological: See HPI Genitourinary: Denies darkened urine Hematological: Denies easy  bruising/bleeding Dermatological: Denies jaundice Psychological: Mood is stable   Medications Current Outpatient Medications  Medication Sig Dispense Refill   acetaminophen (TYLENOL) 325 MG tablet Take 2 tablets (650 mg total) by mouth every 6 (six) hours as needed for mild pain (or Fever >/= 101).     Continuous Blood Gluc Sensor (DEXCOM G6 SENSOR) MISC 1 Device by Other route as directed. Change sensors every 10 days 9 each 3   Continuous Blood Gluc Transmit (DEXCOM G6 TRANSMITTER) MISC 1 Device by Does not apply route as directed. 1 each 3   diphenhydramine-acetaminophen (TYLENOL PM) 25-500 MG TABS tablet Take 2 tablets by mouth at bedtime as needed (sleep, pain).     insulin aspart (NOVOLOG) 100 UNIT/ML injection 60 units a day 60 mL 3   Insulin Disposable Pump (OMNIPOD 5 G6 POD, GEN 5,) MISC 1 Device by Does not apply route every 3 (three) days. (Patient taking differently: 1 Device by Does not apply route every 3 (three) days. Depending on meals) 6 each 3   insulin glargine (LANTUS SOLOSTAR) 100 UNIT/ML Solostar Pen Inject 16 Units into the skin daily. (Patient taking differently: Inject 16 Units into the skin daily as needed (if don't have pump).) 15 mL 3   Insulin Pen Needle 32G X 4 MM MISC 1 Device by Does not apply route daily in the afternoon. 100 each 3   lipase/protease/amylase (CREON) 36000 UNITS CPEP capsule Take 2 capsules (72,000 Units total) by mouth 3 (three) times daily before meals. 240 capsule 3   omeprazole (PRILOSEC) 40 MG capsule Take 1 capsule (40 mg total) by mouth daily. 90 capsule 1   ondansetron (ZOFRAN) 8 MG tablet Take 1 tablet (8  mg total) by mouth every 8 (eight) hours as needed for nausea or vomiting. 30 tablet 0   traMADol (ULTRAM) 50 MG tablet Take 1 tablet (50 mg total) by mouth every 6 (six) hours as needed. 20 tablet 0   HYDROmorphone (DILAUDID) 4 MG tablet Take 1 tablet (4 mg total) by mouth every 4 (four) hours as needed for severe pain. 30 tablet 0    No current facility-administered medications for this visit.    Allergies Allergies  Allergen Reactions   Other Anaphylaxis    [name] - anaphylaxis    Histories Past Medical History:  Diagnosis Date   AKI (acute kidney injury) ([place]) 12/21/2021   Anxiety    Diabetes ([place])    Pancreatitis    Splenic vein thrombosis    Past Surgical History:  Procedure Laterality Date   BILIARY DILATION  12/01/2022   Procedure: BILIARY DILATION;  Surgeon: Rush Landmark Telford Nab., MD;  Location: [facility];  Service: Gastroenterology;;   BIOPSY  12/02/2021   Procedure: BIOPSY;  Surgeon: Irving Copas., MD;  Location: [facility];  Service: Gastroenterology;;   ENDOSCOPIC RETROGRADE CHOLANGIOPANCREATOGRAPHY (ERCP) WITH PROPOFOL N/A 08/12/2022   Procedure: ENDOSCOPIC RETROGRADE CHOLANGIOPANCREATOGRAPHY (ERCP) WITH PROPOFOL;  Surgeon: Irving Copas., MD;  Location: [facility];  Service: Gastroenterology;  Laterality: N/A;   ENDOSCOPIC RETROGRADE CHOLANGIOPANCREATOGRAPHY (ERCP) WITH PROPOFOL N/A 12/01/2022   Procedure: ENDOSCOPIC RETROGRADE CHOLANGIOPANCREATOGRAPHY (ERCP) WITH PROPOFOL;  Surgeon: Rush Landmark Telford Nab., MD;  Location: [facility];  Service: Gastroenterology;  Laterality: N/A;   ESOPHAGOGASTRODUODENOSCOPY N/A 12/01/2022   Procedure: ESOPHAGOGASTRODUODENOSCOPY (EGD);  Surgeon: Irving Copas., MD;  Location: [facility];  Service: Gastroenterology;  Laterality: N/A;   ESOPHAGOGASTRODUODENOSCOPY (EGD) WITH PROPOFOL N/A 12/02/2021   Procedure: ESOPHAGOGASTRODUODENOSCOPY (EGD) WITH PROPOFOL;  Surgeon: Rush Landmark Telford Nab., MD;  Location: [facility];  Service: Gastroenterology;  Laterality: N/A;   FINE NEEDLE ASPIRATION N/A 12/01/2022   Procedure: FINE NEEDLE ASPIRATION (FNA) LINEAR;  Surgeon: Irving Copas., MD;  Location: [facility];  Service: Gastroenterology;  Laterality: N/A;   NEUROLYTIC CELIAC PLEXUS  12/01/2022   Procedure: CELIAC  PLEXUS BLOCK;  Surgeon: Rush Landmark, Telford Nab., MD;  Location: [facility];  Service: Gastroenterology;;   NO PAST SURGERIES     PANCREATIC STENT PLACEMENT  08/12/2022   Procedure: PANCREATIC STENT PLACEMENT;  Surgeon: Irving Copas., MD;  Location: [facility];  Service: Gastroenterology;;   PANCREATIC STENT PLACEMENT  12/01/2022   Procedure: PANCREATIC STENT PLACEMENT;  Surgeon: Irving Copas., MD;  Location: [facility];  Service: Gastroenterology;;   REMOVAL OF STONES  12/01/2022   Procedure: REMOVAL OF STONES;  Surgeon: Irving Copas., MD;  Location: [facility];  Service: Gastroenterology;;   Joan Mayans  08/12/2022   Procedure: Joan Mayans;  Surgeon: Irving Copas., MD;  Location: [facility];  Service: Gastroenterology;;   Joan Mayans  12/01/2022   Procedure: Joan Mayans;  Surgeon: Irving Copas., MD;  Location: [facility];  Service: Gastroenterology;;   Lavell Islam REMOVAL  12/01/2022   Procedure: STENT REMOVAL;  Surgeon: Irving Copas., MD;  Location: [facility];  Service: Gastroenterology;;   UPPER ESOPHAGEAL ENDOSCOPIC ULTRASOUND (EUS) N/A 12/02/2021   Procedure: UPPER ESOPHAGEAL ENDOSCOPIC ULTRASOUND (EUS);  Surgeon: Irving Copas., MD;  Location: [facility];  Service: Gastroenterology;  Laterality: N/A;   UPPER ESOPHAGEAL ENDOSCOPIC ULTRASOUND (EUS) N/A 12/01/2022   Procedure: UPPER ESOPHAGEAL ENDOSCOPIC ULTRASOUND (EUS);  Surgeon: Irving Copas., MD;  Location: [facility];  Service: Gastroenterology;  Laterality: N/A;   Social History   Socioeconomic History  Marital status: Single    Spouse name: Not on file   Number of children: 6   Years of education: Not on file   Highest education level: Not on file  Occupational History   Occupation: forklift driver  Tobacco Use   Smoking status: Some Days    Types: Cigars    Start date: 11/08/2002   Smokeless tobacco: Never   Tobacco comments:     Once a week.  Vaping Use   Vaping Use: Never used  Substance and Sexual Activity   Alcohol use: Not Currently   Drug use: Never   Sexual activity: Not on file  Other Topics Concern   Not on file  Social History Narrative   Not on file   Social Determinants of Health   Financial Resource Strain: Not on file  Food Insecurity: No Food Insecurity (08/14/2022)   Hunger Vital Sign    Worried About Running Out of Food in the Last Year: Never true    Ran Out of Food in the Last Year: Never true  Transportation Needs: No Transportation Needs (08/14/2022)   PRAPARE - Hydrologist (Medical): No    Lack of Transportation (Non-Medical): No  Physical Activity: Not on file  Stress: Not on file  Social Connections: Not on file  Intimate Partner Violence: Not At Risk (08/14/2022)   Humiliation, Afraid, Rape, and Kick questionnaire    Fear of Current or Ex-Partner: No    Emotionally Abused: No    Physically Abused: No    Sexually Abused: No   Family History  Problem Relation Age of Onset   Hypertension Mother    Alcoholism Maternal Uncle    Diabetes Maternal Grandmother    Diabetes Maternal Grandfather    Diabetes Paternal Grandmother    Diabetes Paternal Grandfather    Colon cancer Neg Hx    Esophageal cancer Neg Hx    Inflammatory bowel disease Neg Hx    Liver disease Neg Hx    Pancreatic cancer Neg Hx    Rectal cancer Neg Hx    Stomach cancer Neg Hx    I have reviewed his medical, social, and family history in detail and updated the electronic medical record as necessary.    PHYSICAL EXAMINATION  BP 130/88   Pulse 94   Ht 6\' 5"  (1.956 m)   Wt 164 lb (74.4 kg)   BMI 19.45 kg/m  Wt Readings from Last 3 Encounters:  01/25/23 162 lb 12.8 oz (73.8 kg)  01/25/23 164 lb (74.4 kg)  01/05/23 161 lb (73 kg)  GEN: NAD, appears stated age, doesn't appear chronically ill PSYCH: Cooperative, without pressured speech EYE: Conjunctivae pink, sclerae  anicteric ENT: MMM CV: Nontachycardic RESP: No audible wheezing GI: NABS, soft, significant TTP in MEG upon deep palpation (6 out of 10 pain), without rebound MSK/EXT: No lower extremity edema SKIN: No jaundice NEURO:  Alert & Oriented x 3, no focal deficits   REVIEW OF DATA  I reviewed the following data at the time of this encounter:  GI Procedures and Studies  1/23 ERCP - Prior pancreatic sphincterotomy appeared open. - One partially occluded stent from the pancreatic duct was seen in the major papilla. This was removed and sent for cytology. - A localized irregularity/narrowing (improved from prior) was found in the ventral pancreatic duct in the head of the pancreas. - Pancreatic stone fragments were found. Complete removal was accomplished. - A biliary sphincterotomy was performed. The biliary tree was  swept and nothing was found. - The ventral head pancreatic duct was successfully dilated to 4 mm. - One plastic pancreatic stent was placed into the ventral pancreatic duct  1/23 EUS - Prior pancreatic sphincterotomy appeared open. - One partially occluded stent from the pancreatic duct was seen in the major papilla. This was removed and sent for cytology. - A localized irregularity/narrowing (improved from prior) was found in the ventral pancreatic duct in the head of the pancreas. - Pancreatic stone fragments were found. Complete removal was accomplished. - A biliary sphincterotomy was performed. The biliary tree was swept and nothing was found. - The ventral head pancreatic duct was successfully dilated to 4 mm. - One plastic pancreatic stent was placed into the ventral pancreatic duct  Laboratory Studies  Reviewed those in epic  Imaging Studies  January 2024 CT abdomen pelvis with contrast IMPRESSION: 1. Findings consistent with mild acute on chronic pancreatitis. 2. Mild interval increase in size of the area of parenchymal low attenuation within the tail of the pancreas which may  represent a small area of pancreatic necrosis. MRI follow-up is recommended to exclude the presence of an underlying neoplastic process. 3. Stable pancreatic ductal stent positioning. 4. Sigmoid diverticulosis.   ASSESSMENT  Carlos [name] is a 37 y.o. 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  The patient is seen today for evaluation and management of:  1. Chronic pancreatitis, unspecified pancreatitis type ([place])   2. Dilation of pancreatic duct   3. Stricture of pancreatic duct   4. Chronic generalized abdominal pain    The patient is hemodynamically stable at this time.  I was quite happy with the patient's last ERCP and wanted to do 1 more round of stenting to try to see if we could further improve the stricturing in the head of the pancreas.  Hopefully at his upcoming ERCP in a few weeks we will be able to remove his stent and subsequently leave him without 1 and see where things go from there.  Unfortunately, the patient's chronic recurrent pancreatitis remains an issue for him as he continues to struggle with pain on a regular basis.  He will have days to few weeks at a time where he will be doing okay but then be disabled by the significant amount of pain that he is experiencing.  Unfortunately if fixing his pancreatic stricture does not improve his symptoms, then I have no other endoscopic therapies that we will be able to help him.  Question would be whether he would ever be a candidate for an auto islet cell transplant which is only done at certain centers in the [place].  He is not a candidate at this time for a [place] or Frey procedure as his pancreatic duct is not significantly dilated and we have  been able to open things up.  Time is going to tell with him.  I do want to give him a very short course of 5 days worth of tramadol but I need to talk with his primary care provider because I do not want this to be a issue that will take away his ability to get pain contract medication opioid therapy from him.  If his primary care provider does not want me to provide any further opioid therapy, then I will stop.  It may be reasonable if there are concerns in the overall standing of this patient for pain management to begin working on a referral so that there can be a longer term plan put in place.  Unfortunately, he is going to have chronic pain.  He is never shown any drug seeking behavior to me.  Unfortunately his disease is such that he will have to deal with this potentially for the rest of his life.  This is unfortunate but what he is dealing with at this time.  All patient questions were answered to the best of my ability, and the patient agrees to the aforementioned plan of action with follow-up as indicated.   PLAN  Proceed with the previously scheduled ERCP for pancreas stent exchange and hopeful removal Continue Creon  -72,000 units with each meal and 36,000 units with each snack Continue complete alcohol abstinence Continue to try to have a complete minimization of tobacco use  Short course tramadol 50 mg every 6 hours as needed (20/0) - Will discuss with PCP who gives him his chronic opioid therapy that I felt he needed something in the short-term Patient may benefit from pain management referral for long-term use as his disease is going to be relapsing and remitting unfortunately Query referral in future for auto islet cell transplant and total pancreatectomy   No orders of the defined types were placed in this encounter.   New Prescriptions   TRAMADOL (ULTRAM) 50 MG TABLET    Take 1 tablet (50 mg total) by mouth every 6 (six) hours as needed.   Modified Medications   Modified  Medication Previous Medication   HYDROMORPHONE (DILAUDID) 4 MG TABLET HYDROmorphone (DILAUDID) 4 MG tablet      Take 1 tablet (4 mg total) by mouth every 4 (four) hours as needed for severe pain.    Take 1 tablet (4 mg total) by mouth every 4 (four) hours as needed for severe pain.    Planned Follow Up No follow-ups on file.   Total Time in Face-to-Face and in Coordination of Care for patient including independent/personal interpretation/review of prior testing, medical history, examination, medication adjustment, communicating results with the patient directly, reviewing his imaging, and documentation within the EHR is 30 minutes.   Justice Britain, MD [facility] # [id number]

## 2023-01-25 NOTE — Progress Notes (Unsigned)
[facility]ISIT   Primary Care Provider [name], [place] [address] [phone]   Patient Profile: Carlos [name] is a 37 y.o. 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  The patient presents to the [facility] for an evaluation and management of problem(s) noted below:  Problem List 1. Chronic pancreatitis, unspecified pancreatitis type ([place])   2. Dilation of pancreatic duct   3. Stricture of pancreatic duct   4. Chronic generalized abdominal pain    History of Present Illness Please see prior note for full details of HPI.  Interval History The patient returns for scheduled follow-up.  The patient returns for follow-up.  I have not seen the patient since his EUS/ERCP back in January.  He does state that the celiac block was helpful but only for approximately 2 to 3 weeks and subsequently things have returned to being an issue.  He has had at least 1 significant 1 week worth of pain that almost brought him back to the hospital but he was able to manage that as an outpatient because he had to work.  The patient's weight has been overall stable.  He continues to take his Creon supplementation.  He continues to worry about what his long-term life is going to be like in regards to his chronic pain.  GI Review of Systems Positive as above Negative for dysphagia, odynophagia, melena, hematochezia   Review of Systems General: Denies fevers/chills/unintentional weight loss Cardiovascular: Denies chest pain Pulmonary: Denies shortness of breath Gastroenterological: See HPI Genitourinary: Denies darkened urine Hematological: Denies easy  bruising/bleeding Dermatological: Denies jaundice Psychological: Mood is stable   Medications Current Outpatient Medications  Medication Sig Dispense Refill   acetaminophen (TYLENOL) 325 MG tablet Take 2 tablets (650 mg total) by mouth every 6 (six) hours as needed for mild pain (or Fever >/= 101).     Continuous Blood Gluc Sensor (DEXCOM G6 SENSOR) MISC 1 Device by Other route as directed. Change sensors every 10 days 9 each 3   Continuous Blood Gluc Transmit (DEXCOM G6 TRANSMITTER) MISC 1 Device by Does not apply route as directed. 1 each 3   diphenhydramine-acetaminophen (TYLENOL PM) 25-500 MG TABS tablet Take 2 tablets by mouth at bedtime as needed (sleep, pain).     insulin aspart (NOVOLOG) 100 UNIT/ML injection 60 units a day 60 mL 3   Insulin Disposable Pump (OMNIPOD 5 G6 POD, GEN 5,) MISC 1 Device by Does not apply route every 3 (three) days. (Patient taking differently: 1 Device by Does not apply route every 3 (three) days. Depending on meals) 6 each 3   insulin glargine (LANTUS SOLOSTAR) 100 UNIT/ML Solostar Pen Inject 16 Units into the skin daily. (Patient taking differently: Inject 16 Units into the skin daily as needed (if don't have pump).) 15 mL 3   Insulin Pen Needle 32G X 4 MM MISC 1 Device by Does not apply route daily in the afternoon. 100 each 3   lipase/protease/amylase (CREON) 36000 UNITS CPEP capsule Take 2 capsules (72,000 Units total) by mouth 3 (three) times daily before meals. 240 capsule 3   omeprazole (PRILOSEC) 40 MG capsule Take 1 capsule (40 mg total) by mouth daily. 90 capsule 1   ondansetron (ZOFRAN) 8 MG tablet Take 1 tablet (8  mg total) by mouth every 8 (eight) hours as needed for nausea or vomiting. 30 tablet 0   traMADol (ULTRAM) 50 MG tablet Take 1 tablet (50 mg total) by mouth every 6 (six) hours as needed. 20 tablet 0   HYDROmorphone (DILAUDID) 4 MG tablet Take 1 tablet (4 mg total) by mouth every 4 (four) hours as needed for severe pain. 30 tablet 0    No current facility-administered medications for this visit.    Allergies Allergies  Allergen Reactions   Other Anaphylaxis    [name] - anaphylaxis    Histories Past Medical History:  Diagnosis Date   AKI (acute kidney injury) ([place]) 12/21/2021   Anxiety    Diabetes ([place])    Pancreatitis    Splenic vein thrombosis    Past Surgical History:  Procedure Laterality Date   BILIARY DILATION  12/01/2022   Procedure: BILIARY DILATION;  Surgeon: Rush Landmark Telford Nab., MD;  Location: [facility];  Service: Gastroenterology;;   BIOPSY  12/02/2021   Procedure: BIOPSY;  Surgeon: Irving Copas., MD;  Location: [facility];  Service: Gastroenterology;;   ENDOSCOPIC RETROGRADE CHOLANGIOPANCREATOGRAPHY (ERCP) WITH PROPOFOL N/A 08/12/2022   Procedure: ENDOSCOPIC RETROGRADE CHOLANGIOPANCREATOGRAPHY (ERCP) WITH PROPOFOL;  Surgeon: Irving Copas., MD;  Location: [facility];  Service: Gastroenterology;  Laterality: N/A;   ENDOSCOPIC RETROGRADE CHOLANGIOPANCREATOGRAPHY (ERCP) WITH PROPOFOL N/A 12/01/2022   Procedure: ENDOSCOPIC RETROGRADE CHOLANGIOPANCREATOGRAPHY (ERCP) WITH PROPOFOL;  Surgeon: Rush Landmark Telford Nab., MD;  Location: [facility];  Service: Gastroenterology;  Laterality: N/A;   ESOPHAGOGASTRODUODENOSCOPY N/A 12/01/2022   Procedure: ESOPHAGOGASTRODUODENOSCOPY (EGD);  Surgeon: Irving Copas., MD;  Location: [facility];  Service: Gastroenterology;  Laterality: N/A;   ESOPHAGOGASTRODUODENOSCOPY (EGD) WITH PROPOFOL N/A 12/02/2021   Procedure: ESOPHAGOGASTRODUODENOSCOPY (EGD) WITH PROPOFOL;  Surgeon: Rush Landmark Telford Nab., MD;  Location: [facility];  Service: Gastroenterology;  Laterality: N/A;   FINE NEEDLE ASPIRATION N/A 12/01/2022   Procedure: FINE NEEDLE ASPIRATION (FNA) LINEAR;  Surgeon: Irving Copas., MD;  Location: [facility];  Service: Gastroenterology;  Laterality: N/A;   NEUROLYTIC CELIAC PLEXUS  12/01/2022   Procedure: CELIAC  PLEXUS BLOCK;  Surgeon: Rush Landmark, Telford Nab., MD;  Location: [facility];  Service: Gastroenterology;;   NO PAST SURGERIES     PANCREATIC STENT PLACEMENT  08/12/2022   Procedure: PANCREATIC STENT PLACEMENT;  Surgeon: Irving Copas., MD;  Location: [facility];  Service: Gastroenterology;;   PANCREATIC STENT PLACEMENT  12/01/2022   Procedure: PANCREATIC STENT PLACEMENT;  Surgeon: Irving Copas., MD;  Location: [facility];  Service: Gastroenterology;;   REMOVAL OF STONES  12/01/2022   Procedure: REMOVAL OF STONES;  Surgeon: Irving Copas., MD;  Location: [facility];  Service: Gastroenterology;;   Joan Mayans  08/12/2022   Procedure: Joan Mayans;  Surgeon: Irving Copas., MD;  Location: [facility];  Service: Gastroenterology;;   Joan Mayans  12/01/2022   Procedure: Joan Mayans;  Surgeon: Irving Copas., MD;  Location: [facility];  Service: Gastroenterology;;   Lavell Islam REMOVAL  12/01/2022   Procedure: STENT REMOVAL;  Surgeon: Irving Copas., MD;  Location: [facility];  Service: Gastroenterology;;   UPPER ESOPHAGEAL ENDOSCOPIC ULTRASOUND (EUS) N/A 12/02/2021   Procedure: UPPER ESOPHAGEAL ENDOSCOPIC ULTRASOUND (EUS);  Surgeon: Irving Copas., MD;  Location: [facility];  Service: Gastroenterology;  Laterality: N/A;   UPPER ESOPHAGEAL ENDOSCOPIC ULTRASOUND (EUS) N/A 12/01/2022   Procedure: UPPER ESOPHAGEAL ENDOSCOPIC ULTRASOUND (EUS);  Surgeon: Irving Copas., MD;  Location: [facility];  Service: Gastroenterology;  Laterality: N/A;   Social History   Socioeconomic History  Marital status: Single    Spouse name: Not on file   Number of children: 6   Years of education: Not on file   Highest education level: Not on file  Occupational History   Occupation: forklift driver  Tobacco Use   Smoking status: Some Days    Types: Cigars    Start date: 11/08/2002   Smokeless tobacco: Never   Tobacco comments:     Once a week.  Vaping Use   Vaping Use: Never used  Substance and Sexual Activity   Alcohol use: Not Currently   Drug use: Never   Sexual activity: Not on file  Other Topics Concern   Not on file  Social History Narrative   Not on file   Social Determinants of Health   Financial Resource Strain: Not on file  Food Insecurity: No Food Insecurity (08/14/2022)   Hunger Vital Sign    Worried About Running Out of Food in the Last Year: Never true    Ran Out of Food in the Last Year: Never true  Transportation Needs: No Transportation Needs (08/14/2022)   PRAPARE - Hydrologist (Medical): No    Lack of Transportation (Non-Medical): No  Physical Activity: Not on file  Stress: Not on file  Social Connections: Not on file  Intimate Partner Violence: Not At Risk (08/14/2022)   Humiliation, Afraid, Rape, and Kick questionnaire    Fear of Current or Ex-Partner: No    Emotionally Abused: No    Physically Abused: No    Sexually Abused: No   Family History  Problem Relation Age of Onset   Hypertension Mother    Alcoholism Maternal Uncle    Diabetes Maternal Grandmother    Diabetes Maternal Grandfather    Diabetes Paternal Grandmother    Diabetes Paternal Grandfather    Colon cancer Neg Hx    Esophageal cancer Neg Hx    Inflammatory bowel disease Neg Hx    Liver disease Neg Hx    Pancreatic cancer Neg Hx    Rectal cancer Neg Hx    Stomach cancer Neg Hx    I have reviewed his medical, social, and family history in detail and updated the electronic medical record as necessary.    PHYSICAL EXAMINATION  BP 130/88   Pulse 94   Ht 6\' 5"  (1.956 m)   Wt 164 lb (74.4 kg)   BMI 19.45 kg/m  Wt Readings from Last 3 Encounters:  01/25/23 162 lb 12.8 oz (73.8 kg)  01/25/23 164 lb (74.4 kg)  01/05/23 161 lb (73 kg)  GEN: NAD, appears stated age, doesn't appear chronically ill PSYCH: Cooperative, without pressured speech EYE: Conjunctivae pink, sclerae  anicteric ENT: MMM CV: Nontachycardic RESP: No audible wheezing GI: NABS, soft, significant TTP in MEG upon deep palpation (6 out of 10 pain), without rebound MSK/EXT: No lower extremity edema SKIN: No jaundice NEURO:  Alert & Oriented x 3, no focal deficits   REVIEW OF DATA  I reviewed the following data at the time of this encounter:  GI Procedures and Studies  1/23 ERCP - Prior pancreatic sphincterotomy appeared open. - One partially occluded stent from the pancreatic duct was seen in the major papilla. This was removed and sent for cytology. - A localized irregularity/narrowing (improved from prior) was found in the ventral pancreatic duct in the head of the pancreas. - Pancreatic stone fragments were found. Complete removal was accomplished. - A biliary sphincterotomy was performed. The biliary tree was  swept and nothing was found. - The ventral head pancreatic duct was successfully dilated to 4 mm. - One plastic pancreatic stent was placed into the ventral pancreatic duct  1/23 EUS - Prior pancreatic sphincterotomy appeared open. - One partially occluded stent from the pancreatic duct was seen in the major papilla. This was removed and sent for cytology. - A localized irregularity/narrowing (improved from prior) was found in the ventral pancreatic duct in the head of the pancreas. - Pancreatic stone fragments were found. Complete removal was accomplished. - A biliary sphincterotomy was performed. The biliary tree was swept and nothing was found. - The ventral head pancreatic duct was successfully dilated to 4 mm. - One plastic pancreatic stent was placed into the ventral pancreatic duct  Laboratory Studies  Reviewed those in epic  Imaging Studies  January 2024 CT abdomen pelvis with contrast IMPRESSION: 1. Findings consistent with mild acute on chronic pancreatitis. 2. Mild interval increase in size of the area of parenchymal low attenuation within the tail of the pancreas which may  represent a small area of pancreatic necrosis. MRI follow-up is recommended to exclude the presence of an underlying neoplastic process. 3. Stable pancreatic ductal stent positioning. 4. Sigmoid diverticulosis.   ASSESSMENT  Mr. Jahnke is a 37 y.o. 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  The patient is seen today for evaluation and management of:  1. Chronic pancreatitis, unspecified pancreatitis type ([place])   2. Dilation of pancreatic duct   3. Stricture of pancreatic duct   4. Chronic generalized abdominal pain    The patient is hemodynamically stable at this time.  I was quite happy with the patient's last ERCP and wanted to do 1 more round of stenting to try to see if we could further improve the stricturing in the head of the pancreas.  Hopefully at his upcoming ERCP in a few weeks we will be able to remove his stent and subsequently leave him without 1 and see where things go from there.  Unfortunately, the patient's chronic recurrent pancreatitis remains an issue for him as he continues to struggle with pain on a regular basis.  He will have days to few weeks at a time where he will be doing okay but then be disabled by the significant amount of pain that he is experiencing.  Unfortunately if fixing his pancreatic stricture does not improve his symptoms, then I have no other endoscopic therapies that we will be able to help him.  Question would be whether he would ever be a candidate for an auto islet cell transplant which is only done at certain centers in the [place].  He is not a candidate at this time for a [place] or Frey procedure as his pancreatic duct is not significantly dilated and we have  been able to open things up.  Time is going to tell with him.  I do want to give him a very short course of 5 days worth of tramadol but I need to talk with his primary care provider because I do not want this to be a issue that will take away his ability to get pain contract medication opioid therapy from him.  If his primary care provider does not want me to provide any further opioid therapy, then I will stop.  It may be reasonable if there are concerns in the overall standing of this patient for pain management to begin working on a referral so that there can be a longer term plan put in place.  Unfortunately, he is going to have chronic pain.  He is never shown any drug seeking behavior to me.  Unfortunately his disease is such that he will have to deal with this potentially for the rest of his life.  This is unfortunate but what he is dealing with at this time.  All patient questions were answered to the best of my ability, and the patient agrees to the aforementioned plan of action with follow-up as indicated.   PLAN  Proceed with the previously scheduled ERCP for pancreas stent exchange and hopeful removal Continue Creon  -72,000 units with each meal and 36,000 units with each snack Continue complete alcohol abstinence Continue to try to have a complete minimization of tobacco use  Short course tramadol 50 mg every 6 hours as needed (20/0) - Will discuss with PCP who gives him his chronic opioid therapy that I felt he needed something in the short-term Patient may benefit from pain management referral for long-term use as his disease is going to be relapsing and remitting unfortunately Query referral in future for auto islet cell transplant and total pancreatectomy   No orders of the defined types were placed in this encounter.   New Prescriptions   TRAMADOL (ULTRAM) 50 MG TABLET    Take 1 tablet (50 mg total) by mouth every 6 (six) hours as needed.   Modified Medications   Modified  Medication Previous Medication   HYDROMORPHONE (DILAUDID) 4 MG TABLET HYDROmorphone (DILAUDID) 4 MG tablet      Take 1 tablet (4 mg total) by mouth every 4 (four) hours as needed for severe pain.    Take 1 tablet (4 mg total) by mouth every 4 (four) hours as needed for severe pain.    Planned Follow Up No follow-ups on file.   Total Time in Face-to-Face and in Coordination of Care for patient including independent/personal interpretation/review of prior testing, medical history, examination, medication adjustment, communicating results with the patient directly, reviewing his imaging, and documentation within the EHR is 30 minutes.   Justice Britain, MD [facility] # [phone]

## 2023-01-26 ENCOUNTER — Encounter: Payer: Self-pay | Admitting: Gastroenterology

## 2023-01-26 ENCOUNTER — Encounter (HOSPITAL_COMMUNITY): Payer: Self-pay | Admitting: Gastroenterology

## 2023-01-26 ENCOUNTER — Ambulatory Visit: Payer: Medicaid Other | Admitting: Family Medicine

## 2023-01-26 DIAGNOSIS — G8929 Other chronic pain: Secondary | ICD-10-CM | POA: Insufficient documentation

## 2023-01-26 DIAGNOSIS — K8689 Other specified diseases of pancreas: Secondary | ICD-10-CM | POA: Insufficient documentation

## 2023-01-31 ENCOUNTER — Telehealth (INDEPENDENT_AMBULATORY_CARE_PROVIDER_SITE_OTHER): Payer: Medicaid Other | Admitting: Orthopedic Surgery

## 2023-01-31 ENCOUNTER — Encounter: Payer: Self-pay | Admitting: Orthopedic Surgery

## 2023-01-31 ENCOUNTER — Other Ambulatory Visit: Payer: Self-pay | Admitting: Family Medicine

## 2023-01-31 DIAGNOSIS — R1013 Epigastric pain: Secondary | ICD-10-CM

## 2023-01-31 DIAGNOSIS — G8929 Other chronic pain: Secondary | ICD-10-CM

## 2023-01-31 MED ORDER — HYDROMORPHONE HCL 4 MG PO TABS: 4.0000 mg | ORAL_TABLET | ORAL | 0 refills | Status: DC | PRN

## 2023-01-31 NOTE — Progress Notes (Signed)
   This service is provided via telemedicine  No vital signs collected/recorded due to the encounter was a telemedicine visit.   Location of patient (ex: home, work):  Home  Patient consents to a telephone visit:  Yes  Location of the provider (ex: office, home):  [facility]  Name of any referring provider:  [name], MD   Names of all persons participating in the telemedicine service and their role in the encounter:  Patient, Heriberto Antig[place], [place], Windell Moulding, NP.    Time spent on call: 8 minutes spent on the phone with Medical Assistant.

## 2023-01-31 NOTE — Progress Notes (Signed)
Careteam: Patient Care Team: [name], MD as PCP - General (Family Medicine) Jodi Marble, MD as Consulting Physician (Otolaryngology)  Seen by: Windell Moulding, AGNP-C  PLACE OF SERVICE:  [place] Directive information Does Patient Have a Medical Advance Directive?: No, Would patient like information on creating a medical advance directive?: No - Patient declined  Allergies  Allergen Reactions   Other Anaphylaxis    [name] - anaphylaxis    Chief Complaint  Patient presents with   Acute Visit    Patient complains of pain.      HPI: Patient is a 37 y.o. male seen today via video visit due to ongoing abdominal pain.   Followed by Dr. Rush Landmark due to ongoing chronic pancreatitis. 11/2022 CT abdomen revealed mild acute on chronic pancreatitis> f/u MRI recommended to exclude underlying neoplastic process. He underwent ERCP with stenting around that time. Symptoms improved for 2-3 weeks then returned. Since procedure he has been hospitalized once. 03/19 he was seen by GI and another ERCP with stenting recommended> scheduled 03/27. He continues to have increased abdominal pain with vomiting. He is only able to keep down chicken broth at this time. He is followed by Dr. Sabra Heck and has pain contract signed with him. He is out of dilaudid at this time. Requesting more pain medication today. Pain rated > 7, described as constant.   Review of Systems:  Review of Systems  Constitutional:  Negative for chills and fever.  Respiratory:  Negative for cough.   Cardiovascular:  Negative for chest pain.  Gastrointestinal:  Positive for abdominal pain, nausea and vomiting.  Psychiatric/Behavioral:  Negative for depression. The patient is not nervous/anxious.     Past Medical History:  Diagnosis Date   AKI (acute kidney injury) ([place]) 12/21/2021   Anxiety    Diabetes ([place])    Pancreatitis    Splenic vein thrombosis    Past Surgical History:  Procedure Laterality  Date   BILIARY DILATION  12/01/2022   Procedure: BILIARY DILATION;  Surgeon: Rush Landmark Telford Nab., MD;  Location: [facility];  Service: Gastroenterology;;   BIOPSY  12/02/2021   Procedure: BIOPSY;  Surgeon: Irving Copas., MD;  Location: [facility];  Service: Gastroenterology;;   ENDOSCOPIC RETROGRADE CHOLANGIOPANCREATOGRAPHY (ERCP) WITH PROPOFOL N/A 08/12/2022   Procedure: ENDOSCOPIC RETROGRADE CHOLANGIOPANCREATOGRAPHY (ERCP) WITH PROPOFOL;  Surgeon: Irving Copas., MD;  Location: [facility];  Service: Gastroenterology;  Laterality: N/A;   ENDOSCOPIC RETROGRADE CHOLANGIOPANCREATOGRAPHY (ERCP) WITH PROPOFOL N/A 12/01/2022   Procedure: ENDOSCOPIC RETROGRADE CHOLANGIOPANCREATOGRAPHY (ERCP) WITH PROPOFOL;  Surgeon: Rush Landmark Telford Nab., MD;  Location: [facility];  Service: Gastroenterology;  Laterality: N/A;   ESOPHAGOGASTRODUODENOSCOPY N/A 12/01/2022   Procedure: ESOPHAGOGASTRODUODENOSCOPY (EGD);  Surgeon: Irving Copas., MD;  Location: [facility];  Service: Gastroenterology;  Laterality: N/A;   ESOPHAGOGASTRODUODENOSCOPY (EGD) WITH PROPOFOL N/A 12/02/2021   Procedure: ESOPHAGOGASTRODUODENOSCOPY (EGD) WITH PROPOFOL;  Surgeon: Rush Landmark Telford Nab., MD;  Location: [facility];  Service: Gastroenterology;  Laterality: N/A;   FINE NEEDLE ASPIRATION N/A 12/01/2022   Procedure: FINE NEEDLE ASPIRATION (FNA) LINEAR;  Surgeon: Irving Copas., MD;  Location: [facility];  Service: Gastroenterology;  Laterality: N/A;   NEUROLYTIC CELIAC PLEXUS  12/01/2022   Procedure: CELIAC PLEXUS BLOCK;  Surgeon: Rush Landmark Telford Nab., MD;  Location: [facility];  Service: Gastroenterology;;   NO PAST SURGERIES     PANCREATIC STENT PLACEMENT  08/12/2022   Procedure: PANCREATIC STENT PLACEMENT;  Surgeon: Irving Copas., MD;  Location: [facility];  Service: Gastroenterology;;  PANCREATIC STENT PLACEMENT  12/01/2022   Procedure: PANCREATIC STENT PLACEMENT;   Surgeon: Rush Landmark Telford Nab., MD;  Location: [facility];  Service: Gastroenterology;;   REMOVAL OF STONES  12/01/2022   Procedure: REMOVAL OF STONES;  Surgeon: Irving Copas., MD;  Location: [facility];  Service: Gastroenterology;;   Joan Mayans  08/12/2022   Procedure: Joan Mayans;  Surgeon: Irving Copas., MD;  Location: [facility];  Service: Gastroenterology;;   Joan Mayans  12/01/2022   Procedure: Joan Mayans;  Surgeon: Irving Copas., MD;  Location: [facility];  Service: Gastroenterology;;   Lavell Islam REMOVAL  12/01/2022   Procedure: STENT REMOVAL;  Surgeon: Irving Copas., MD;  Location: [facility];  Service: Gastroenterology;;   UPPER ESOPHAGEAL ENDOSCOPIC ULTRASOUND (EUS) N/A 12/02/2021   Procedure: UPPER ESOPHAGEAL ENDOSCOPIC ULTRASOUND (EUS);  Surgeon: Irving Copas., MD;  Location: [facility];  Service: Gastroenterology;  Laterality: N/A;   UPPER ESOPHAGEAL ENDOSCOPIC ULTRASOUND (EUS) N/A 12/01/2022   Procedure: UPPER ESOPHAGEAL ENDOSCOPIC ULTRASOUND (EUS);  Surgeon: Irving Copas., MD;  Location: [facility];  Service: Gastroenterology;  Laterality: N/A;   Social History:   reports that he has been smoking cigars. He started smoking about 20 years ago. He has never used smokeless tobacco. He reports that he does not currently use alcohol. He reports that he does not use drugs.  Family History  Problem Relation Age of Onset   Hypertension Mother    Alcoholism Maternal Uncle    Diabetes Maternal Grandmother    Diabetes Maternal Grandfather    Diabetes Paternal Grandmother    Diabetes Paternal Grandfather    Colon cancer Neg Hx    Esophageal cancer Neg Hx    Inflammatory bowel disease Neg Hx    Liver disease Neg Hx    Pancreatic cancer Neg Hx    Rectal cancer Neg Hx    Stomach cancer Neg Hx     Medications: Patient's Medications  New Prescriptions   No medications on file  Previous Medications    ACETAMINOPHEN (TYLENOL) 325 MG TABLET    Take 2 tablets (650 mg total) by mouth every 6 (six) hours as needed for mild pain (or Fever >/= 101).   CONTINUOUS BLOOD GLUC SENSOR (DEXCOM G6 SENSOR) MISC    1 Device by Other route as directed. Change sensors every 10 days   CONTINUOUS BLOOD GLUC TRANSMIT (DEXCOM G6 TRANSMITTER) MISC    1 Device by Does not apply route as directed.   INSULIN ASPART (NOVOLOG) 100 UNIT/ML INJECTION    60 units a day   INSULIN DISPOSABLE PUMP (OMNIPOD 5 G6 POD, GEN 5,) MISC    1 Device by Does not apply route every 3 (three) days.   INSULIN PEN NEEDLE 32G X 4 MM MISC    1 Device by Does not apply route daily in the afternoon.   LIPASE/PROTEASE/AMYLASE (CREON) 36000 UNITS CPEP CAPSULE    Take 2 capsules (72,000 Units total) by mouth 3 (three) times daily before meals.   OMEPRAZOLE (PRILOSEC) 40 MG CAPSULE    Take 1 capsule (40 mg total) by mouth daily.   ONDANSETRON (ZOFRAN) 8 MG TABLET    Take 1 tablet (8 mg total) by mouth every 8 (eight) hours as needed for nausea or vomiting.  Modified Medications   Modified Medication Previous Medication   HYDROMORPHONE (DILAUDID) 4 MG TABLET HYDROmorphone (DILAUDID) 4 MG tablet      Take 1 tablet (4 mg total) by mouth every 4 (four) hours as needed for severe pain.  Take 1 tablet (4 mg total) by mouth every 4 (four) hours as needed for severe pain.  Discontinued Medications   INSULIN GLARGINE (LANTUS SOLOSTAR) 100 UNIT/ML SOLOSTAR PEN    Inject 16 Units into the skin daily.   TRAMADOL (ULTRAM) 50 MG TABLET    Take 1 tablet (50 mg total) by mouth every 6 (six) hours as needed.    Physical Exam:  There were no vitals filed for this visit. There is no height or weight on file to calculate BMI. Wt Readings from Last 3 Encounters:  01/25/23 162 lb 12.8 oz (73.8 kg)  01/25/23 164 lb (74.4 kg)  01/05/23 161 lb (73 kg)    Physical Exam Vitals (exam limited due to video visit) reviewed.  Constitutional:      General: He is not in  acute distress. Neurological:     Mental Status: He is alert.     Labs reviewed: Basic Metabolic Panel: Recent Labs    03/10/22 1511 04/23/22 1254 07/22/22 0541 08/02/22 0914 08/15/22 0328 08/16/22 0316 08/18/22 0318 10/12/22 1631 11/10/22 2303 11/11/22 1144 11/12/22 0443  NA  --    < > 138   < > 135 136 136  --  134* 134* 137  K  --    < > 4.4   < > 3.5 3.7 4.1  --  3.6 3.6 3.4*  CL  --    < > 99   < > 102 102 102  --  102 100 104  CO2  --    < > 30   < > 24 27 28   --  19* 24 25  GLUCOSE  --    < > 148*   < > 188* 252* 204*  --  193* 172* 85  BUN  --    < > <5*   < > 8 6 8   --  8 10 12   CREATININE  --    < > 0.93   < > 0.70 0.82 0.80   < > 0.81 0.83 0.75  CALCIUM  --    < > 9.9   < > 8.4* 8.5* 8.9  --  9.2 9.1 8.6*  MG  --    < > 1.9  --  1.7 1.7 1.7  --   --   --   --   PHOS  --   --  4.0  --   --   --   --   --   --   --   --   TSH 0.81  --   --   --   --   --   --   --   --   --   --    < > = values in this interval not displayed.   Liver Function Tests: Recent Labs    08/18/22 0318 11/10/22 2303 11/12/22 0443  AST 12* 21 16  ALT 12 19 14   ALKPHOS 72 80 57  BILITOT 0.4 1.0 0.8  PROT 6.8 7.5 6.1*  ALBUMIN 3.5 4.2 3.3*   Recent Labs    03/05/22 1239 04/23/22 1254 08/13/22 1934 08/18/22 0318 11/10/22 2303  LIPASE 56.0   < > 40 42 66*  AMYLASE 73  --   --   --   --    < > = values in this interval not displayed.   No results for input(s): "AMMONIA" in the last 8760 hours. CBC: Recent Labs    08/02/22 0914 08/13/22 1934  08/15/22 0328 08/18/22 0318 11/10/22 2303 11/12/22 0443  WBC 5.5 6.3   < > 3.9* 5.7 4.6  NEUTROABS 3,410 4.6  --   --  3.6  --   HGB 13.1* 14.1   < > 12.2* 14.8 13.7  HCT 37.8* 42.2   < > 37.3* 44.3 41.2  MCV 97.4 98.6   < > 101.4* 104.2* 105.4*  PLT 230 187   < > 174 222 178   < > = values in this interval not displayed.   Lipid Panel: Recent Labs    03/10/22 1511 04/27/22 0953 07/20/22 0215  CHOL 104  --   --   HDL  56.50  --   --   LDLCALC 18  --   --   TRIG 149.0 90 153*  CHOLHDL 2  --   --    TSH: Recent Labs    03/10/22 1511  TSH 0.81   A1C: Lab Results  Component Value Date   HGBA1C 8.4 (H) 11/03/2022     Assessment/Plan 1. Abdominal pain, chronic, epigastric - ongoing, h/o chronic pancreatitis - Followed by Dr. Rush Landmark - ERCP/stenting scheduled 03/27 - out of dilaudid pain medication today> pain contract signed with Dr. Sabra Heck  - will reach out to him for refill  - advised to report to ED if pain not relieved or worsens/ or he cannot keep fluids/food down   Virtual Visit   I connected with Carlos [name] via virtual visit and verified that I am speaking with the correct person using two identifiers.  Location: [facility] Patient: Carlos [name]  Provider: Yvonna Alanis, NP    I discussed the limitations, risks, security and privacy concerns of performing an evaluation and management service by telephone and the availability of in person appointments. I also discussed with the patient that there may be a patient responsible charge related to this service. The patient expressed understanding and agreed to proceed.   I discussed the assessment and treatment plan with the patient. The patient was provided an opportunity to ask questions and all were answered. The patient agreed with the plan and demonstrated an understanding of the instructions.   The patient was advised to call back or seek an in-person evaluation if the symptoms worsen or if the condition fails to improve as anticipated.  I provided 11 minutes face-to-face time during this encounter.  Cl[place], NP  Avs printed and mailed   Next appt: Visit date not found  [place], [facility] [phone]

## 2023-02-02 ENCOUNTER — Ambulatory Visit (HOSPITAL_BASED_OUTPATIENT_CLINIC_OR_DEPARTMENT_OTHER)
Admission: RE | Admit: 2023-02-02 | Discharge: 2023-02-02 | Disposition: A | Discharge status: Home / Self Care | Payer: Medicaid Other | Source: Home / Self Care | Attending: Gastroenterology | Admitting: Gastroenterology

## 2023-02-02 ENCOUNTER — Ambulatory Visit (HOSPITAL_COMMUNITY): Payer: Medicaid Other | Admitting: Anesthesiology

## 2023-02-02 ENCOUNTER — Ambulatory Visit: Payer: Medicaid Other | Admitting: Family Medicine

## 2023-02-02 ENCOUNTER — Encounter (HOSPITAL_COMMUNITY): Payer: Self-pay | Admitting: Gastroenterology

## 2023-02-02 ENCOUNTER — Ambulatory Visit (HOSPITAL_BASED_OUTPATIENT_CLINIC_OR_DEPARTMENT_OTHER): Payer: Medicaid Other | Admitting: Anesthesiology

## 2023-02-02 ENCOUNTER — Encounter (HOSPITAL_COMMUNITY)
Admission: RE | Disposition: A | Discharge status: Home / Self Care | Payer: Self-pay | Source: Home / Self Care | Attending: Gastroenterology

## 2023-02-02 ENCOUNTER — Other Ambulatory Visit: Payer: Self-pay | Admitting: Family Medicine

## 2023-02-02 ENCOUNTER — Other Ambulatory Visit: Payer: Self-pay

## 2023-02-02 ENCOUNTER — Ambulatory Visit (HOSPITAL_COMMUNITY): Payer: Medicaid Other

## 2023-02-02 DIAGNOSIS — Z794 Long term (current) use of insulin: Secondary | ICD-10-CM | POA: Insufficient documentation

## 2023-02-02 DIAGNOSIS — F419 Anxiety disorder, unspecified: Secondary | ICD-10-CM | POA: Insufficient documentation

## 2023-02-02 DIAGNOSIS — K861 Other chronic pancreatitis: Secondary | ICD-10-CM

## 2023-02-02 DIAGNOSIS — K8689 Other specified diseases of pancreas: Secondary | ICD-10-CM

## 2023-02-02 DIAGNOSIS — Z9889 Other specified postprocedural states: Secondary | ICD-10-CM

## 2023-02-02 DIAGNOSIS — Z9641 Presence of insulin pump (external) (internal): Secondary | ICD-10-CM | POA: Insufficient documentation

## 2023-02-02 DIAGNOSIS — F17200 Nicotine dependence, unspecified, uncomplicated: Secondary | ICD-10-CM | POA: Insufficient documentation

## 2023-02-02 DIAGNOSIS — E109 Type 1 diabetes mellitus without complications: Secondary | ICD-10-CM | POA: Insufficient documentation

## 2023-02-02 DIAGNOSIS — T85590A Other mechanical complication of bile duct prosthesis, initial encounter: Secondary | ICD-10-CM

## 2023-02-02 DIAGNOSIS — Z4659 Encounter for fitting and adjustment of other gastrointestinal appliance and device: Secondary | ICD-10-CM | POA: Diagnosis not present

## 2023-02-02 HISTORY — PX: PANCREATIC STENT PLACEMENT: SHX5539

## 2023-02-02 HISTORY — PX: ENDOSCOPIC RETROGRADE CHOLANGIOPANCREATOGRAPHY (ERCP) WITH PROPOFOL: SHX5810

## 2023-02-02 HISTORY — PX: REMOVAL OF STONES: SHX5545

## 2023-02-02 HISTORY — PX: BILIARY BRUSHING: SHX6843

## 2023-02-02 HISTORY — PX: STENT REMOVAL: SHX6421

## 2023-02-02 HISTORY — PX: BILIARY DILATION: SHX6850

## 2023-02-02 LAB — GLUCOSE, CAPILLARY
Glucose-Capillary: 187 mg/dL — ABNORMAL HIGH (ref 70–99)
Glucose-Capillary: 251 mg/dL — ABNORMAL HIGH (ref 70–99)

## 2023-02-02 SURGERY — ENDOSCOPIC RETROGRADE CHOLANGIOPANCREATOGRAPHY (ERCP) WITH PROPOFOL
Anesthesia: General

## 2023-02-02 MED ORDER — FENTANYL CITRATE (PF) 250 MCG/5ML IJ SOLN
INTRAMUSCULAR | Status: AC
  Filled 2023-02-02: qty 5

## 2023-02-02 MED ORDER — LIDOCAINE 2% (20 MG/ML) 5 ML SYRINGE
INTRAMUSCULAR | Status: DC | PRN
  Administered 2023-02-02: 60 mg via INTRAVENOUS

## 2023-02-02 MED ORDER — ROCURONIUM BROMIDE 10 MG/ML (PF) SYRINGE
PREFILLED_SYRINGE | INTRAVENOUS | Status: DC | PRN
  Administered 2023-02-02: 30 mg via INTRAVENOUS
  Administered 2023-02-02: 50 mg via INTRAVENOUS
  Administered 2023-02-02: 20 mg via INTRAVENOUS

## 2023-02-02 MED ORDER — SUGAMMADEX SODIUM 200 MG/2ML IV SOLN
INTRAVENOUS | Status: DC | PRN
  Administered 2023-02-02: 200 mg via INTRAVENOUS

## 2023-02-02 MED ORDER — MIDAZOLAM HCL 2 MG/2ML IJ SOLN
INTRAMUSCULAR | Status: DC | PRN
  Administered 2023-02-02: 2 mg via INTRAVENOUS

## 2023-02-02 MED ORDER — PROPOFOL 10 MG/ML IV BOLUS
INTRAVENOUS | Status: DC | PRN
  Administered 2023-02-02: 150 mg via INTRAVENOUS
  Administered 2023-02-02: 50 mg via INTRAVENOUS

## 2023-02-02 MED ORDER — DEXAMETHASONE SODIUM PHOSPHATE 10 MG/ML IJ SOLN
INTRAMUSCULAR | Status: DC | PRN
  Administered 2023-02-02: 4 mg via INTRAVENOUS

## 2023-02-02 MED ORDER — INDOMETHACIN 50 MG RE SUPP
RECTAL | Status: AC
  Filled 2023-02-02: qty 2

## 2023-02-02 MED ORDER — CIPROFLOXACIN IN D5W 400 MG/200ML IV SOLN
INTRAVENOUS | Status: AC
  Filled 2023-02-02: qty 200

## 2023-02-02 MED ORDER — FENTANYL CITRATE (PF) 100 MCG/2ML IJ SOLN
25.0000 ug | Freq: Once | INTRAMUSCULAR | Status: AC
  Administered 2023-02-02: 25 ug via INTRAVENOUS

## 2023-02-02 MED ORDER — IOPAMIDOL (ISOVUE-300) INJECTION 61%
INTRAVENOUS | Status: DC | PRN
  Administered 2023-02-02: 5 mL

## 2023-02-02 MED ORDER — DICLOFENAC SUPPOSITORY 100 MG
RECTAL | Status: DC | PRN
  Administered 2023-02-02: 100 mg via RECTAL

## 2023-02-02 MED ORDER — GLUCAGON HCL RDNA (DIAGNOSTIC) 1 MG IJ SOLR
INTRAMUSCULAR | Status: AC
  Filled 2023-02-02: qty 1

## 2023-02-02 MED ORDER — DICLOFENAC SUPPOSITORY 100 MG
RECTAL | Status: AC
  Filled 2023-02-02: qty 1

## 2023-02-02 MED ORDER — FENTANYL CITRATE (PF) 250 MCG/5ML IJ SOLN
INTRAMUSCULAR | Status: DC | PRN
  Administered 2023-02-02: 100 ug via INTRAVENOUS
  Administered 2023-02-02: 50 ug via INTRAVENOUS
  Administered 2023-02-02: 100 ug via INTRAVENOUS

## 2023-02-02 MED ORDER — ONDANSETRON HCL 4 MG/2ML IJ SOLN
INTRAMUSCULAR | Status: DC | PRN
  Administered 2023-02-02: 4 mg via INTRAVENOUS

## 2023-02-02 MED ORDER — PROPOFOL 10 MG/ML IV BOLUS
INTRAVENOUS | Status: AC
  Filled 2023-02-02: qty 20

## 2023-02-02 MED ORDER — FENTANYL CITRATE (PF) 100 MCG/2ML IJ SOLN
INTRAMUSCULAR | Status: AC
  Filled 2023-02-02: qty 2

## 2023-02-02 MED ORDER — MIDAZOLAM HCL 2 MG/2ML IJ SOLN
INTRAMUSCULAR | Status: AC
  Filled 2023-02-02: qty 2

## 2023-02-02 MED ORDER — LACTATED RINGERS IV SOLN: INTRAVENOUS | Status: DC

## 2023-02-02 MED ORDER — GLUCAGON HCL RDNA (DIAGNOSTIC) 1 MG IJ SOLR
INTRAMUSCULAR | Status: DC | PRN
  Administered 2023-02-02 (×4): .25 mg via INTRAVENOUS

## 2023-02-02 MED ORDER — SODIUM CHLORIDE 0.9 % IV SOLN: INTRAVENOUS | Status: DC

## 2023-02-02 MED ORDER — OXYCODONE HCL 10 MG PO TABS: 10.0000 mg | ORAL_TABLET | Freq: Four times a day (QID) | ORAL | 0 refills | Status: AC | PRN

## 2023-02-02 MED ORDER — CIPROFLOXACIN IN D5W 400 MG/200ML IV SOLN
400.0000 mg | Freq: Once | INTRAVENOUS | Status: AC
  Administered 2023-02-02: 400 mg via INTRAVENOUS

## 2023-02-02 NOTE — Anesthesia Procedure Notes (Signed)
Procedure Name: Intubation Date/Time: 02/02/2023 9:17 AM  Performed by: Lollie Sails, CRNAPre-anesthesia Checklist: Patient identified, Emergency Drugs available, Suction available, Patient being monitored and Timeout performed Patient Re-evaluated:Patient Re-evaluated prior to induction Oxygen Delivery Method: Circle system utilized Preoxygenation: Pre-oxygenation with 100% oxygen Induction Type: IV induction Ventilation: Mask ventilation without difficulty Laryngoscope Size: Miller and 3 Grade View: Grade I Tube type: Oral Tube size: 8.0 mm Number of attempts: 1 Airway Equipment and Method: Stylet Placement Confirmation: ETT inserted through vocal cords under direct vision, positive ETCO2 and breath sounds checked- equal and bilateral Secured at: 24 cm Tube secured with: Tape Dental Injury: Teeth and Oropharynx as per pre-operative assessment

## 2023-02-02 NOTE — Op Note (Addendum)
[facility] Patient Name: Carlos [name] Procedure Date: 02/02/2023 MRN: [id number] Attending MD: Justice Britain , MD, [id number] Date of Birth: Nov 03, 1986 CSN: [id number] Age: 37 Admit Type: Outpatient Procedure:                ERCP Indications:              Pancreatic duct stricture, Epigastric abdominal                            pain, Chronic recurrent pancreatitis, Pancreatic                            duct stone, Stent change, Prior Endoscopic                            Retrograde Cholangiopancreatography Providers:                Justice Britain, MD, Glori Bickers, RN, Frazier Richards, Technician, Cletis Athens, Technician Referring MD:             Lillette Boxer. Miller Medicines:                General Anesthesia, Cipro 400 mg IV, Diclofenac 100                            mg rectal, Glucagon 1 mg IV Complications:            No immediate complications. Estimated Blood Loss:     Estimated blood loss was minimal. Procedure:                Pre-Anesthesia Assessment:                           - Prior to the procedure, a History and Physical                            was performed, and patient medications and                            allergies were reviewed. The patient's tolerance of                            previous anesthesia was also reviewed. The risks                            and benefits of the procedure and the sedation                            options and risks were discussed with the patient.                            All questions were answered, and informed consent  was obtained. Prior Anticoagulants: The patient has                            taken no anticoagulant or antiplatelet agents. ASA                            Grade Assessment: II - A patient with mild systemic                            disease. After reviewing the risks and benefits,                            the patient was deemed in  satisfactory condition to                            undergo the procedure.                           After obtaining informed consent, the scope was                            passed under direct vision. Throughout the                            procedure, the patient's blood pressure, pulse, and                            oxygen saturations were monitored continuously. The                            [employer] D single use                            duodenoscope was introduced through the mouth, and                            used to inject contrast into and used to inject                            contrast into the ventral pancreatic duct. The ERCP                            was accomplished without difficulty. The patient                            tolerated the procedure. Scope In: Scope Out: Findings:      A pancreatic stent was visible on the scout film.      The esophagus was successfully intubated under direct vision without       detailed examination of the pharynx, larynx, and associated structures,       and upper GI tract. Inspection of the major papilla revealed that       biliary and pancreatic sphincterotomies had been performed previously.       The biliary sphincterotomy  appeared open. The pancreatic sphincterotomy       appeared open. One plastic pancreatic stent originating in the       pancreatic duct was emerging from the major papilla. The stent was       partially occluded. One stent was removed from the pancreatic duct using       a snare.      A short 0.035 inch Soft Jagwire was passed into the ventral pancreatic       duct. The ventral pancreatic duct was then deeply cannulated with the       Hydratome sphincterotome. Contrast was injected. I personally       interpreted the pancreatic duct images. Ductal flow of contrast was       adequate. Image quality was adequate. Contrast extended to the       pancreatic duct. Opacification of the  ventral pancreatic duct in the       head of the pancreas, pancreatic duct in the genu of the pancreas and       pancreatic duct in the body of the pancreas was successful. The       pancreatic duct in the genu of the pancreas contained a moderate       stenosis/narrowing approximately fifteen mm in length. Upstream to this       narrowing pancreatic stone fragments and thickened the pancreatic duct       in the body of the pancreas was dilated mildly. To find object(s) the       ventral pancreatic duct was swept with a retrieval balloon. A few       pancreatic stones and thickened pancreatic juices were removed. It was       not felt that any further stones remained. The pancreatic duct       stenosis/narrowing was successfully dilated with a HurriCaine 4 mm       balloon dilator. Cells for cytology were obtained by brushing in the       pancreatic duct stenosis/narrowing in the genu of the pancreas.       Subsequently a second short 0.035 inch Soft Jagwire was passed into the       ventral pancreatic duct. Two plastic pancreatic stents (7 Fr by 7 cm and       5 Fr by 9 cm) with two external flaps and a single internal flap were       placed into the ventral pancreatic duct to traverse the stricture. The       stents were in good position.      The duodenoscope was withdrawn from the patient. Impression:               - Prior biliary sphincterotomy appeared open.                           - Prior pancreatic sphincterotomy appeared open.                           - One partially occluded stent from the pancreatic                            duct was seen in the major papilla. This was  removed.                           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 Upstream                            to this stricture/narrowing, mild dilatation of the                            pancreatic duct in the body of the pancreas was                            found.                            - Pancreatic stones were found. Complete removal                            was accomplished by balloon sweeping.                           - Dilation performed of the pancreatic duct                            stricture. Subsequently cells for cytology obtained                            in the pancreatic duct in the genu of the pancreas.                           - Two plastic pancreatic stents were placed into                            the ventral pancreatic duct to traverse the                            stricture and allow further dilation. Moderate Sedation:      Not Applicable - Patient had care per Anesthesia. Recommendation:           - The patient will be observed post-procedure,                            until all discharge criteria are met.                           - Pending patient's overall pain control post                            procedure, if able to discharge, will discharge                            home with short course of acute narcotic to try to                            prevent  him from needing to come into the hospital.                            This is not to take over for his primary care                            provider who is giving the patient long-term                            narcotic medication however for his known chronic                            pancreatitis. This is to allow the patient to try                            to minimize coming into the hospital if possible.                            Hopefully will be able to discharge patient to home.                           - If patient's pain is not controllable and he does                            not feel comfortable, then we will ask medicine to                            admit the patient for post ERCP treatment.                           - Patient has a contact number available for                            emergencies. The signs and symptoms of potential                             delayed complications were discussed with the                            patient. Return to normal activities tomorrow.                            Written discharge instructions were provided to the                            patient.                           - Low fat diet.                           - Watch for pancreatitis, bleeding, perforation,  and cholangitis.                           - Repeat ERCP in 2-3 months to exchange stents.                           - Follow-up in clinic will be arranged.                           - Await cytology results.                           - The findings and recommendations were discussed                            with the patient.                           - The findings and recommendations were discussed                            with the designated responsible adult. Procedure Code(s):        --- Professional ---                           [phone], Endoscopic retrograde                            cholangiopancreatography (ERCP); with removal and                            exchange of stent(s), biliary or pancreatic duct,                            including pre- and post-dilation and guide wire                            passage, when performed, including sphincterotomy,                            when performed, each stent exchanged                           43276, 88, Endoscopic retrograde                            cholangiopancreatography (ERCP); with removal and                            exchange of stent(s), biliary or pancreatic duct,                            including pre- and post-dilation and guide wire                            passage, when performed, including sphincterotomy,  when performed, each stent exchanged                           43264, Endoscopic retrograde                            cholangiopancreatography (ERCP); with removal of                             calculi/debris from biliary/pancreatic duct(s)                           74329, 26, Endoscopic catheterization of the                            pancreatic ductal system, radiological supervision                            and interpretation Diagnosis Code(s):        --- Professional ---                           T85.590A, Other mechanical complication of bile                            duct prosthesis, initial encounter                           K86.89, Other specified diseases of pancreas                           Z46.59, Encounter for fitting and adjustment of                            other gastrointestinal appliance and device                           R10.13, Epigastric pain                           K86.1, Other chronic pancreatitis                           Z98.890, Other specified postprocedural states CPT copyright 2022 [employer]. All rights reserved. The codes documented in this report are preliminary and upon coder review may  be revised to meet current compliance requirements. Justice Britain, MD 02/02/2023 10:55:30 AM Number of Addenda: 0

## 2023-02-02 NOTE — Discharge Instructions (Signed)
YOU HAD AN ENDOSCOPIC PROCEDURE TODAY: Refer to the procedure report and other information in the discharge instructions given to you for any specific questions about what was found during the examination. If this information does not answer your questions, please call [place] office at [phone] to clarify.   YOU SHOULD EXPECT: Some feelings of bloating in the abdomen. Passage of more gas than usual. Walking can help get rid of the air that was put into your GI tract during the procedure and reduce the bloating. If you had a lower endoscopy (such as a colonoscopy or flexible sigmoidoscopy) you may notice spotting of blood in your stool or on the toilet paper. Some abdominal soreness may be present for a day or two, also.  DIET: Your first meal following the procedure should be a light meal and then it is ok to progress to your normal diet. A half-sandwich or bowl of soup is an example of a good first meal. Heavy or fried foods are harder to digest and may make you feel nauseous or bloated. Drink plenty of fluids but you should avoid alcoholic beverages for 24 hours. If you had a esophageal dilation, please see attached instructions for diet.    ACTIVITY: Your care partner should take you home directly after the procedure. You should plan to take it easy, moving slowly for the rest of the day. You can resume normal activity the day after the procedure however YOU SHOULD NOT DRIVE, use power tools, machinery or perform tasks that involve climbing or major physical exertion for 24 hours (because of the sedation medicines used during the test).   SYMPTOMS TO REPORT IMMEDIATELY: A gastroenterologist can be reached at any hour. Please call [phone]  for any of the following symptoms:   Following upper endoscopy (ERCP) Vomiting of blood or coffee ground material  New, significant abdominal pain  New, significant chest pain or pain under the shoulder blades  Painful or persistently difficult  swallowing  New shortness of breath  Black, tarry-looking or red, bloody stools  FOLLOW UP:  If any biopsies were taken you will be contacted by phone or by letter within the next 1-3 weeks. Call [phone]  if you have not heard about the biopsies in 3 weeks.  Please also call with any specific questions about appointments or follow up tests.

## 2023-02-02 NOTE — Interval H&P Note (Signed)
History and Physical Interval Note:  02/02/2023 8:52 AM  Carlos [name]  has presented today for surgery, with the diagnosis of chronic pancreatitis.  The various methods of treatment have been discussed with the patient and family. After consideration of risks, benefits and other options for treatment, the patient has consented to  Procedure(s): ENDOSCOPIC RETROGRADE CHOLANGIOPANCREATOGRAPHY (ERCP) WITH PROPOFOL (N/A) as a surgical intervention.  The patient's history has been reviewed, patient examined, no change in status, stable for surgery.  I have reviewed the patient's chart and labs.  Questions were answered to the patient's satisfaction.     The risks of an ERCP were discussed at length, including but not limited to the risk of perforation, bleeding, abdominal pain, post-ERCP pancreatitis (while usually mild can be severe and even life threatening).    [employer]

## 2023-02-02 NOTE — Transfer of Care (Signed)
Immediate Anesthesia Transfer of Care Note  Patient: Ren Gladys  Procedure(s) Performed: ENDOSCOPIC RETROGRADE CHOLANGIOPANCREATOGRAPHY (ERCP) WITH PROPOFOL STENT REMOVAL BILIARY DILATION REMOVAL OF STONES BILIARY BRUSHING PANCREATIC STENT PLACEMENT  Patient Location: PACU and Endoscopy Unit  Anesthesia Type:General  Level of Consciousness: awake, alert , and patient cooperative  Airway & Oxygen Therapy: Patient Spontanous Breathing and Patient connected to face mask oxygen  Post-op Assessment: Report given to RN and Post -op Vital signs reviewed and stable and BP 117/115  Post vital signs: Reviewed and stable  Last Vitals:  Vitals Value Taken Time  BP    Temp    Pulse 90 02/02/23 1042  Resp 10 02/02/23 1042  SpO2 100 % 02/02/23 1042  Vitals shown include unvalidated device data.  Last Pain:  Vitals:   02/02/23 0806  TempSrc: Temporal  PainSc: 6          Complications: No notable events documented.

## 2023-02-02 NOTE — Progress Notes (Signed)
The patient was evaluated in the postprocedural recovery area. Patient is currently having 7-8 out of 10 pain postprocedure. His preprocedure pain was 6-7. He certainly is at risk with [place] having done significant pancreatic ductal work for the potential of having pancreatitis post procedure. He is going to receive at least 1-2 doses of fentanyl to see if we can get things under control. If we are able to get things under control, then will try to send the patient home with acute pain Dilaudid in an effort of trying to keep him out of the hospital if possible.  If not, then we will ask our medicine colleagues to see about admitting the patient for post procedure ERCP pancreatitis treatment. Is like he is likely going to be able to go home but we will see where things.   12 PM reassessment Patient is back to his baseline level of pain prior to his procedure. At this point to try to prevent him from needing to come into the hospital for post ERCP pancreatitis pain, I am going to send him with oxycodone 10 mg every 6 hours as needed (12/0).  This will be for 3 days of acute pain should he need it.  Patient is aware of the signs and symptoms of pancreatitis pain and dehydration that would require him to come to the hospital and he is willing to do that if necessary.  He does think he can go home today safely.  I will have my team reach out to him in the next couple of days to see where things stand.    Justice Britain, MD [facility] # [phone]

## 2023-02-02 NOTE — Telephone Encounter (Signed)
Last ordered: Today (02/02/2023) by Irving Copas., MD    Patient comment: [facility] won't allow me to pick up the prescription. I just got out of surgery and I was wondering if you could send it to another pharmacy

## 2023-02-02 NOTE — Anesthesia Postprocedure Evaluation (Signed)
Anesthesia Post Note  Patient: Carlos [name]  Procedure(s) Performed: ENDOSCOPIC RETROGRADE CHOLANGIOPANCREATOGRAPHY (ERCP) WITH PROPOFOL STENT REMOVAL BILIARY DILATION REMOVAL OF STONES BILIARY BRUSHING PANCREATIC STENT PLACEMENT     Patient location during evaluation: Endoscopy Anesthesia Type: General Level of consciousness: awake and alert Pain management: pain level controlled Vital Signs Assessment: post-procedure vital signs reviewed and stable Respiratory status: spontaneous breathing, nonlabored ventilation, respiratory function stable and patient connected to nasal cannula oxygen Cardiovascular status: blood pressure returned to baseline and stable Postop Assessment: no apparent nausea or vomiting Anesthetic complications: no  No notable events documented.  Last Vitals:  Vitals:   02/02/23 1200 02/02/23 1208  BP: (!) 142/89 126/78  Pulse: 78 80  Resp: 13 10  Temp:    SpO2: 96% 98%    Last Pain:  Vitals:   02/02/23 1208  TempSrc:   PainSc: 6                  Ladarrius Bogdanski L Keirah Konitzer

## 2023-02-02 NOTE — Anesthesia Preprocedure Evaluation (Addendum)
Anesthesia Evaluation  Patient identified by MRN, date of birth, ID band Patient awake    Reviewed: Allergy & Precautions, NPO status , Patient's Chart, lab work & pertinent test results  Airway Mallampati: I  TM Distance: >3 FB Neck ROM: Full    Dental  (+) Chipped, Dental Advisory Given,    Pulmonary Current Smoker and Patient abstained from smoking.   Pulmonary exam normal breath sounds clear to auscultation       Cardiovascular Normal cardiovascular exam Rhythm:Regular Rate:Normal  TTE 2021  1. Left ventricular ejection fraction, by estimation, is 60 to 65%. The  left ventricle has normal function. The left ventricle has no regional  wall motion abnormalities. There is mild left ventricular hypertrophy.  Left ventricular diastolic parameters  were normal.   2. Right ventricular systolic function is normal. The right ventricular  size is normal.   3. The mitral valve is normal in structure. No evidence of mitral valve  regurgitation. No evidence of mitral stenosis.   4. The aortic valve is tricuspid. Aortic valve regurgitation is not  visualized. No aortic stenosis is present.   5. The inferior vena cava is normal in size with greater than 50%  respiratory variability, suggesting right atrial pressure of 3 mmHg.     Neuro/Psych  PSYCHIATRIC DISORDERS Anxiety     negative neurological ROS     GI/Hepatic negative GI ROS,,,(+)     substance abuse  alcohol use  Endo/Other  diabetes, Type 1, Insulin Dependent  Insulin pump and dexcom both off  Renal/GU negative Renal ROS  negative genitourinary   Musculoskeletal negative musculoskeletal ROS (+)    Abdominal   Peds  Hematology negative hematology ROS (+)   Anesthesia Other Findings Chronic pancreatits  Reproductive/Obstetrics                             Anesthesia Physical Anesthesia Plan  ASA: 3  Anesthesia Plan: General    Post-op Pain Management:    Induction: Intravenous  PONV Risk Score and Plan: Midazolam, Dexamethasone and Ondansetron  Airway Management Planned: Oral ETT  Additional Equipment:   Intra-op Plan:   Post-operative Plan: Extubation in OR  Informed Consent: I have reviewed the patients History and Physical, chart, labs and discussed the procedure including the risks, benefits and alternatives for the proposed anesthesia with the patient or authorized representative who has indicated his/her understanding and acceptance.     Dental advisory given  Plan Discussed with: CRNA  Anesthesia Plan Comments:        Anesthesia Quick Evaluation

## 2023-02-03 ENCOUNTER — Encounter (HOSPITAL_COMMUNITY): Payer: Self-pay

## 2023-02-03 ENCOUNTER — Inpatient Hospital Stay (HOSPITAL_COMMUNITY): Payer: Medicaid Other

## 2023-02-03 ENCOUNTER — Other Ambulatory Visit: Payer: Self-pay

## 2023-02-03 ENCOUNTER — Inpatient Hospital Stay (HOSPITAL_COMMUNITY)
Admission: EM | Admit: 2023-02-03 | Discharge: 2023-02-05 | DRG: 919 | Disposition: A | Discharge status: Home / Self Care | Payer: Medicaid Other | Attending: Family Medicine | Admitting: Family Medicine

## 2023-02-03 DIAGNOSIS — Y730 Diagnostic and monitoring gastroenterology and urology devices associated with adverse incidents: Secondary | ICD-10-CM | POA: Diagnosis present

## 2023-02-03 DIAGNOSIS — F17290 Nicotine dependence, other tobacco product, uncomplicated: Secondary | ICD-10-CM | POA: Diagnosis present

## 2023-02-03 DIAGNOSIS — Z79899 Other long term (current) drug therapy: Secondary | ICD-10-CM | POA: Diagnosis not present

## 2023-02-03 DIAGNOSIS — K859 Acute pancreatitis without necrosis or infection, unspecified: Secondary | ICD-10-CM | POA: Diagnosis present

## 2023-02-03 DIAGNOSIS — Y731 Therapeutic (nonsurgical) and rehabilitative gastroenterology and urology devices associated with adverse incidents: Secondary | ICD-10-CM | POA: Diagnosis present

## 2023-02-03 DIAGNOSIS — K9189 Other postprocedural complications and disorders of digestive system: Secondary | ICD-10-CM | POA: Diagnosis present

## 2023-02-03 DIAGNOSIS — G8918 Other acute postprocedural pain: Principal | ICD-10-CM

## 2023-02-03 DIAGNOSIS — I10 Essential (primary) hypertension: Secondary | ICD-10-CM | POA: Diagnosis present

## 2023-02-03 DIAGNOSIS — Y838 Other surgical procedures as the cause of abnormal reaction of the patient, or of later complication, without mention of misadventure at the time of the procedure: Secondary | ICD-10-CM | POA: Diagnosis present

## 2023-02-03 DIAGNOSIS — T85858A Stenosis due to other internal prosthetic devices, implants and grafts, initial encounter: Secondary | ICD-10-CM | POA: Diagnosis not present

## 2023-02-03 DIAGNOSIS — Z86718 Personal history of other venous thrombosis and embolism: Secondary | ICD-10-CM

## 2023-02-03 DIAGNOSIS — Z9641 Presence of insulin pump (external) (internal): Secondary | ICD-10-CM | POA: Diagnosis present

## 2023-02-03 DIAGNOSIS — R1084 Generalized abdominal pain: Secondary | ICD-10-CM | POA: Diagnosis not present

## 2023-02-03 DIAGNOSIS — Z794 Long term (current) use of insulin: Secondary | ICD-10-CM | POA: Diagnosis not present

## 2023-02-03 DIAGNOSIS — K861 Other chronic pancreatitis: Secondary | ICD-10-CM | POA: Diagnosis present

## 2023-02-03 DIAGNOSIS — K8689 Other specified diseases of pancreas: Secondary | ICD-10-CM | POA: Diagnosis present

## 2023-02-03 DIAGNOSIS — Z91018 Allergy to other foods: Secondary | ICD-10-CM

## 2023-02-03 DIAGNOSIS — E109 Type 1 diabetes mellitus without complications: Secondary | ICD-10-CM | POA: Diagnosis not present

## 2023-02-03 DIAGNOSIS — Z9889 Other specified postprocedural states: Secondary | ICD-10-CM | POA: Diagnosis not present

## 2023-02-03 DIAGNOSIS — Z833 Family history of diabetes mellitus: Secondary | ICD-10-CM

## 2023-02-03 DIAGNOSIS — G8929 Other chronic pain: Secondary | ICD-10-CM | POA: Diagnosis present

## 2023-02-03 DIAGNOSIS — Z8249 Family history of ischemic heart disease and other diseases of the circulatory system: Secondary | ICD-10-CM

## 2023-02-03 LAB — URINALYSIS, ROUTINE W REFLEX MICROSCOPIC
Bacteria, UA: NONE SEEN
Bilirubin Urine: NEGATIVE
Glucose, UA: >=500 mg/dL — AB
Hgb urine dipstick: NEGATIVE
Ketones, ur: 5 mg/dL — AB
Leukocytes,Ua: NEGATIVE
Nitrite: NEGATIVE
Protein, ur: NEGATIVE mg/dL
Specific Gravity, Urine: 1.01 (ref 1.005–1.030)
pH: 5 (ref 5.0–8.0)

## 2023-02-03 LAB — CBC
HCT: 45.6 % (ref 39.0–52.0)
Hemoglobin: 14.8 g/dL (ref 13.0–17.0)
MCH: 35 pg — ABNORMAL HIGH (ref 26.0–34.0)
MCHC: 32.5 g/dL (ref 30.0–36.0)
MCV: 107.8 fL — ABNORMAL HIGH (ref 80.0–100.0)
Platelets: 154 10*3/uL (ref 150–400)
RBC: 4.23 MIL/uL (ref 4.22–5.81)
RDW: 11.8 % (ref 11.5–15.5)
WBC: 4.4 10*3/uL (ref 4.0–10.5)
nRBC: 0 % (ref 0.0–0.2)

## 2023-02-03 LAB — HIV ANTIBODY (ROUTINE TESTING W REFLEX): HIV Screen 4th Generation wRfx: NONREACTIVE

## 2023-02-03 LAB — CBC WITH DIFFERENTIAL/PLATELET
Abs Immature Granulocytes: 0.02 10*3/uL (ref 0.00–0.07)
Basophils Absolute: 0 10*3/uL (ref 0.0–0.1)
Basophils Relative: 0 %
Eosinophils Absolute: 0.2 10*3/uL (ref 0.0–0.5)
Eosinophils Relative: 3 %
HCT: 41.5 % (ref 39.0–52.0)
Hemoglobin: 14 g/dL (ref 13.0–17.0)
Immature Granulocytes: 0 %
Lymphocytes Relative: 22 %
Lymphs Abs: 1 10*3/uL (ref 0.7–4.0)
MCH: 35.3 pg — ABNORMAL HIGH (ref 26.0–34.0)
MCHC: 33.7 g/dL (ref 30.0–36.0)
MCV: 104.5 fL — ABNORMAL HIGH (ref 80.0–100.0)
Monocytes Absolute: 0.6 10*3/uL (ref 0.1–1.0)
Monocytes Relative: 12 %
Neutro Abs: 3 10*3/uL (ref 1.7–7.7)
Neutrophils Relative %: 63 %
Platelets: 156 10*3/uL (ref 150–400)
RBC: 3.97 MIL/uL — ABNORMAL LOW (ref 4.22–5.81)
RDW: 11.7 % (ref 11.5–15.5)
WBC: 4.8 10*3/uL (ref 4.0–10.5)
nRBC: 0 % (ref 0.0–0.2)

## 2023-02-03 LAB — GLUCOSE, CAPILLARY
Glucose-Capillary: 180 mg/dL — ABNORMAL HIGH (ref 70–99)
Glucose-Capillary: 100 mg/dL — ABNORMAL HIGH (ref 70–99)

## 2023-02-03 LAB — COMPREHENSIVE METABOLIC PANEL
ALT: 29 U/L (ref 0–44)
AST: 35 U/L (ref 15–41)
Albumin: 3.8 g/dL (ref 3.5–5.0)
Alkaline Phosphatase: 131 U/L — ABNORMAL HIGH (ref 38–126)
Anion gap: 11 (ref 5–15)
BUN: 10 mg/dL (ref 6–20)
CO2: 26 mmol/L (ref 22–32)
Calcium: 8.6 mg/dL — ABNORMAL LOW (ref 8.9–10.3)
Chloride: 96 mmol/L — ABNORMAL LOW (ref 98–111)
Creatinine, Ser: 0.8 mg/dL (ref 0.61–1.24)
GFR, Estimated: >60 mL/min (ref >60)
Glucose, Bld: 192 mg/dL — ABNORMAL HIGH (ref 70–99)
Potassium: 3.2 mmol/L — ABNORMAL LOW (ref 3.5–5.1)
Sodium: 133 mmol/L — ABNORMAL LOW (ref 135–145)
Total Bilirubin: 0.7 mg/dL (ref 0.3–1.2)
Total Protein: 7.2 g/dL (ref 6.5–8.1)

## 2023-02-03 LAB — LIPASE, BLOOD: Lipase: 27 U/L (ref 11–51)

## 2023-02-03 LAB — CREATININE, SERUM
Creatinine, Ser: 0.61 mg/dL (ref 0.61–1.24)
GFR, Estimated: >60 mL/min (ref >60)

## 2023-02-03 LAB — CYTOLOGY - NON PAP

## 2023-02-03 MED ORDER — POTASSIUM CHLORIDE IN NACL 20-0.9 MEQ/L-% IV SOLN
INTRAVENOUS | Status: DC
  Filled 2023-02-03: qty 1000

## 2023-02-03 MED ORDER — HYDROMORPHONE HCL 1 MG/ML IJ SOLN
1.0000 mg | Freq: Once | INTRAMUSCULAR | Status: AC
  Administered 2023-02-03: 1 mg via INTRAVENOUS
  Filled 2023-02-03: qty 1

## 2023-02-03 MED ORDER — HYDROMORPHONE HCL 1 MG/ML IJ SOLN
1.0000 mg | INTRAMUSCULAR | Status: DC | PRN
  Administered 2023-02-03 – 2023-02-05 (×9): 1 mg via INTRAVENOUS
  Filled 2023-02-03 (×9): qty 1

## 2023-02-03 MED ORDER — INSULIN ASPART 100 UNIT/ML IJ SOLN
0.0000 [IU] | Freq: Every day | INTRAMUSCULAR | Status: DC
  Filled 2023-02-03: qty 0.05

## 2023-02-03 MED ORDER — LACTATED RINGERS IV SOLN: INTRAVENOUS | Status: DC

## 2023-02-03 MED ORDER — ONDANSETRON HCL 4 MG/2ML IJ SOLN
4.0000 mg | Freq: Four times a day (QID) | INTRAMUSCULAR | Status: DC | PRN
  Administered 2023-02-03: 4 mg via INTRAVENOUS

## 2023-02-03 MED ORDER — DOCUSATE SODIUM 100 MG PO CAPS
100.0000 mg | ORAL_CAPSULE | Freq: Two times a day (BID) | ORAL | Status: DC
  Administered 2023-02-04 – 2023-02-05 (×3): 100 mg via ORAL
  Filled 2023-02-03 (×4): qty 1

## 2023-02-03 MED ORDER — HYDROMORPHONE HCL 2 MG PO TABS
4.0000 mg | ORAL_TABLET | ORAL | Status: DC | PRN
  Administered 2023-02-03 – 2023-02-05 (×3): 4 mg via ORAL
  Filled 2023-02-03 (×3): qty 2

## 2023-02-03 MED ORDER — IOHEXOL 300 MG/ML  SOLN
100.0000 mL | Freq: Once | INTRAMUSCULAR | Status: AC | PRN
  Administered 2023-02-03: 100 mL via INTRAVENOUS

## 2023-02-03 MED ORDER — HYDRALAZINE HCL 20 MG/ML IJ SOLN: 10.0000 mg | Freq: Four times a day (QID) | INTRAMUSCULAR | Status: DC | PRN

## 2023-02-03 MED ORDER — PANTOPRAZOLE SODIUM 40 MG PO TBEC
40.0000 mg | DELAYED_RELEASE_TABLET | Freq: Every day | ORAL | Status: DC
  Administered 2023-02-03 – 2023-02-05 (×3): 40 mg via ORAL
  Filled 2023-02-03 (×3): qty 1

## 2023-02-03 MED ORDER — TRAZODONE HCL 50 MG PO TABS
25.0000 mg | ORAL_TABLET | Freq: Every evening | ORAL | Status: DC | PRN
  Administered 2023-02-03 – 2023-02-04 (×2): 25 mg via ORAL
  Filled 2023-02-03 (×2): qty 1

## 2023-02-03 MED ORDER — ONDANSETRON HCL 4 MG PO TABS: 4.0000 mg | ORAL_TABLET | Freq: Four times a day (QID) | ORAL | Status: DC | PRN

## 2023-02-03 MED ORDER — ALBUTEROL SULFATE (2.5 MG/3ML) 0.083% IN NEBU: 2.5000 mg | INHALATION_SOLUTION | RESPIRATORY_TRACT | Status: DC | PRN

## 2023-02-03 MED ORDER — POTASSIUM CHLORIDE 10 MEQ/100ML IV SOLN
10.0000 meq | INTRAVENOUS | Status: AC
  Administered 2023-02-03 (×4): 10 meq via INTRAVENOUS
  Filled 2023-02-03 (×3): qty 100

## 2023-02-03 MED ORDER — ONDANSETRON HCL 4 MG/2ML IJ SOLN
4.0000 mg | Freq: Once | INTRAMUSCULAR | Status: AC
  Administered 2023-02-03: 4 mg via INTRAVENOUS
  Filled 2023-02-03: qty 2

## 2023-02-03 MED ORDER — SODIUM CHLORIDE 0.9 % IV BOLUS
1000.0000 mL | Freq: Once | INTRAVENOUS | Status: AC
  Administered 2023-02-03: 1000 mL via INTRAVENOUS

## 2023-02-03 MED ORDER — SODIUM CHLORIDE (PF) 0.9 % IJ SOLN
INTRAMUSCULAR | Status: AC
  Filled 2023-02-03: qty 50

## 2023-02-03 MED ORDER — ONDANSETRON HCL 4 MG/2ML IJ SOLN
4.0000 mg | Freq: Four times a day (QID) | INTRAMUSCULAR | Status: DC | PRN
  Filled 2023-02-03: qty 2

## 2023-02-03 MED ORDER — POLYETHYLENE GLYCOL 3350 17 G PO PACK: 17.0000 g | PACK | Freq: Every day | ORAL | Status: DC | PRN

## 2023-02-03 MED ORDER — INSULIN ASPART 100 UNIT/ML IJ SOLN
0.0000 [IU] | Freq: Three times a day (TID) | INTRAMUSCULAR | Status: DC
  Administered 2023-02-03 – 2023-02-04 (×3): 3 [IU] via SUBCUTANEOUS
  Administered 2023-02-05: 5 [IU] via SUBCUTANEOUS
  Filled 2023-02-03: qty 0.15

## 2023-02-03 MED ORDER — INSULIN GLARGINE-YFGN 100 UNIT/ML ~~LOC~~ SOLN
10.0000 [IU] | Freq: Every day | SUBCUTANEOUS | Status: DC
  Administered 2023-02-04 – 2023-02-05 (×2): 10 [IU] via SUBCUTANEOUS
  Filled 2023-02-03 (×2): qty 0.1

## 2023-02-03 MED ORDER — ENOXAPARIN SODIUM 40 MG/0.4ML IJ SOSY
40.0000 mg | PREFILLED_SYRINGE | INTRAMUSCULAR | Status: DC
  Administered 2023-02-04: 40 mg via SUBCUTANEOUS
  Filled 2023-02-03 (×3): qty 0.4

## 2023-02-03 NOTE — Inpatient Diabetes Management (Signed)
Inpatient Diabetes Program Recommendations  AACE/ADA: New Consensus Statement on Inpatient Glycemic Control (2015)  Target Ranges:  Prepandial:   less than 140 mg/dL      Peak postprandial:   less than 180 mg/dL (1-2 hours)      Critically ill patients:  140 - 180 mg/dL   Lab Results  Component Value Date   GLUCAP 251 (H) 02/02/2023   HGBA1C 8.4 (H) 11/03/2022    If patient has insulin pump on, please place insulin pump therapy order set and 02:00 CBG.  Print off contract and flowsheet.    Basal-0.5 units/hr-12 units daily Insulin carb ratio- 1 units for every 12 carbs Insulin sensitivity- 1 unit drops him 50 mg/dL Goal BG 110 mg/dL  Will continue to follow while inpatient.  Thank you, Reche Dixon, MSN, [place] [facility] Coordinator Inpatient Diabetes Program [phone] (team pager from 8a-5p)

## 2023-02-03 NOTE — Consult Note (Addendum)
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 His last ERCP was just yesterday with removal of an occluded PD stent, removal of PD stones and placement of two plastic stents across PD stricture. Having nausea and generalized upper abdominal pain radiating around right side into right lower back. Lipase is normal.   Diabetes  Plan   -CT scan with pancreatic protocol  -IV fluids -Potassium repletion in progress -Pain control. Dilaudid at home hasn't been helping. Oxycodone? -Continue zofran as needed -Resume home creon when tolerating solids  History of Present Illness   Patient is a 37 y.o. year old male with a past medical history of  DM, anxiety, and   See [facility] for any additional medical problems.  Carlos [name] has chronic pancreatitis (alcohol related initially, negative genetic workup, with subsequent PD stricture . He has been undergoing ERCPs with pancreatic duct stenting. His last ERCP was just yesterday with removal of an occluded PD stent, removal of PD stones and placement of two plastic stents across PD stricture.   Patient has chronic abdominal pain. He has had a celiac plexus block which provided only a few weeks relief. Following ERCP yesterday he was having abdominal pain. The pain spans upper abdomen and radiates round right side downward into lower back. He thought he could manage pain at home with oxycodone. Apparently the pharmacy wouldn't give him the Oxycodone because he had Dilaudid at home. He was taking 4 mg of Dilaudid QID which was relieving the pain so  he came to the ED. He has been nauseated, no vomiting. BMs normal. No urinary symptoms  In ED his BP is elevated at 148 / 105. K+ is 3.2. Alk phose 131, liver chemistries and lipase normal.    Previous GI History / Evaluation :   Most recent procedures  02/02/23 ERCP   -Prior biliary sphincterotomy appeared open. - Prior pancreatic sphincterotomy appeared open. - One partially occluded stent from the pancreatic duct was seen in the major papilla. This was removed. 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 Upstream to this stricture/narrowing, mild dilatation of the pancreatic duct in the body of the pancreas was found. - Pancreatic stones were found. Complete removal was accomplished by balloon sweeping. - Dilation performed of the pancreatic duct stricture. Subsequently cells for cytology obtained in the pancreatic duct in the genu of the pancreas. - Two plastic pancreatic stents were placed into the ventral pancreatic duct to traverse the stricture and allow further dilation.    1/23 ERCP - Prior pancreatic sphincterotomy appeared open. - One partially occluded stent from the pancreatic duct was seen in the major papilla. This was removed and sent for cytology. - A localized irregularity/narrowing (improved from prior) was found in the ventral pancreatic duct in the head of the pancreas. - Pancreatic stone fragments were found. Complete removal was accomplished. - A biliary sphincterotomy was performed. The biliary tree was swept and  nothing was found. - The ventral head pancreatic duct was successfully dilated to 4 mm. - One plastic pancreatic stent was placed into the ventral pancreatic duct   1/23 EUS - Prior pancreatic sphincterotomy appeared open. - One partially occluded stent from the pancreatic duct was seen in the major papilla. This was removed and sent for cytology. - A localized irregularity/narrowing (improved from prior) was found in the ventral pancreatic duct in the head of the  pancreas. - Pancreatic stone fragments were found. Complete removal was accomplished. - A biliary sphincterotomy was performed. The biliary tree was swept and nothing was found. - The ventral head pancreatic duct was successfully dilated to 4 mm. - One plastic pancreatic stent was placed into the ventral pancreatic duct   Recent Labs    02/03/23 1229  WBC 4.8  HGB 14.0  HCT 41.5  PLT 156   Recent Labs    02/03/23 1229  NA 133*  K 3.2*  CL 96*  CO2 26  GLUCOSE 192*  BUN 10  CREATININE 0.80  CALCIUM 8.6*   Recent Labs    02/03/23 1229  PROT 7.2  ALBUMIN 3.8  AST 35  ALT 29  ALKPHOS 131*  BILITOT 0.7   No results for input(s): "HEPBSAG", "HCVAB", "HEPAIGM", "HEPBIGM" in the last 72 hours. No results for input(s): "LABPROT", "INR" in the last 72 hours.  Past Medical History:  Diagnosis Date   AKI (acute kidney injury) ([place]) 12/21/2021   Anxiety    Diabetes ([place])    Pancreatitis    Splenic vein thrombosis     Past Surgical History:  Procedure Laterality Date   BILIARY DILATION  12/01/2022   Procedure: BILIARY DILATION;  Surgeon: Rush Landmark Telford Nab., MD;  Location: [facility];  Service: Gastroenterology;;   BIOPSY  12/02/2021   Procedure: BIOPSY;  Surgeon: Irving Copas., MD;  Location: [facility];  Service: Gastroenterology;;   ENDOSCOPIC RETROGRADE CHOLANGIOPANCREATOGRAPHY (ERCP) WITH PROPOFOL N/A 08/12/2022   Procedure: ENDOSCOPIC RETROGRADE CHOLANGIOPANCREATOGRAPHY (ERCP) WITH PROPOFOL;  Surgeon: Irving Copas., MD;  Location: [facility];  Service: Gastroenterology;  Laterality: N/A;   ENDOSCOPIC RETROGRADE CHOLANGIOPANCREATOGRAPHY (ERCP) WITH PROPOFOL N/A 12/01/2022   Procedure: ENDOSCOPIC RETROGRADE CHOLANGIOPANCREATOGRAPHY (ERCP) WITH PROPOFOL;  Surgeon: Rush Landmark Telford Nab., MD;  Location: [facility];  Service: Gastroenterology;  Laterality: N/A;   ESOPHAGOGASTRODUODENOSCOPY N/A 12/01/2022   Procedure: ESOPHAGOGASTRODUODENOSCOPY  (EGD);  Surgeon: Irving Copas., MD;  Location: [facility];  Service: Gastroenterology;  Laterality: N/A;   ESOPHAGOGASTRODUODENOSCOPY (EGD) WITH PROPOFOL N/A 12/02/2021   Procedure: ESOPHAGOGASTRODUODENOSCOPY (EGD) WITH PROPOFOL;  Surgeon: Rush Landmark Telford Nab., MD;  Location: [facility];  Service: Gastroenterology;  Laterality: N/A;   FINE NEEDLE ASPIRATION N/A 12/01/2022   Procedure: FINE NEEDLE ASPIRATION (FNA) LINEAR;  Surgeon: Irving Copas., MD;  Location: [facility];  Service: Gastroenterology;  Laterality: N/A;   NEUROLYTIC CELIAC PLEXUS  12/01/2022   Procedure: CELIAC PLEXUS BLOCK;  Surgeon: Rush Landmark Telford Nab., MD;  Location: [facility];  Service: Gastroenterology;;   NO PAST SURGERIES     PANCREATIC STENT PLACEMENT  08/12/2022   Procedure: PANCREATIC STENT PLACEMENT;  Surgeon: Irving Copas., MD;  Location: [facility];  Service: Gastroenterology;;   PANCREATIC STENT PLACEMENT  12/01/2022   Procedure: PANCREATIC STENT PLACEMENT;  Surgeon: Irving Copas., MD;  Location: [facility];  Service: Gastroenterology;;   REMOVAL OF STONES  12/01/2022   Procedure: REMOVAL OF STONES;  Surgeon: Irving Copas., MD;  Location: [facility];  Service: Gastroenterology;;   Joan Mayans  08/12/2022   Procedure: SPHINCTEROTOMY;  Surgeon: [name], Telford Nab., MD;  Location: [facility];  Service: Gastroenterology;;   Joan Mayans  12/01/2022   Procedure: Joan Mayans;  Surgeon: Rush Landmark Telford Nab., MD;  Location: [facility];  Service: Gastroenterology;;   Lavell Islam REMOVAL  12/01/2022   Procedure: STENT REMOVAL;  Surgeon: Irving Copas., MD;  Location: [facility];  Service: Gastroenterology;;   UPPER ESOPHAGEAL ENDOSCOPIC ULTRASOUND (EUS) N/A 12/02/2021   Procedure: UPPER ESOPHAGEAL ENDOSCOPIC ULTRASOUND (EUS);  Surgeon: Irving Copas., MD;  Location: [facility];  Service: Gastroenterology;  Laterality: N/A;   UPPER  ESOPHAGEAL ENDOSCOPIC ULTRASOUND (EUS) N/A 12/01/2022   Procedure: UPPER ESOPHAGEAL ENDOSCOPIC ULTRASOUND (EUS);  Surgeon: Irving Copas., MD;  Location: [facility];  Service: Gastroenterology;  Laterality: N/A;    Family History  Problem Relation Age of Onset   Hypertension Mother    Alcoholism Maternal Uncle    Diabetes Maternal Grandmother    Diabetes Maternal Grandfather    Diabetes Paternal Grandmother    Diabetes Paternal Grandfather    Colon cancer Neg Hx    Esophageal cancer Neg Hx    Inflammatory bowel disease Neg Hx    Liver disease Neg Hx    Pancreatic cancer Neg Hx    Rectal cancer Neg Hx    Stomach cancer Neg Hx     Prior to Admission medications   Medication Sig Start Date End Date Taking? Authorizing Provider  acetaminophen (TYLENOL) 325 MG tablet Take 2 tablets (650 mg total) by mouth every 6 (six) hours as needed for mild pain (or Fever >/= 101). 07/22/22   Samuella Cota, MD  Continuous Blood Gluc Sensor (DEXCOM G6 SENSOR) MISC 1 Device by Other route as directed. Change sensors every 10 days 11/17/22   [name], Melanie Crazier, MD  Continuous Blood Gluc Transmit (DEXCOM G6 TRANSMITTER) MISC 1 Device by Does not apply route as directed. 11/17/22   [name], Melanie Crazier, MD  HYDROmorphone (DILAUDID) 4 MG tablet Take 1 tablet (4 mg total) by mouth every 4 (four) hours as needed for severe pain. 01/31/23   Fargo, Amy E, NP  insulin aspart (NOVOLOG) 100 UNIT/ML injection 60 units a day 12/14/22   [name], Melanie Crazier, MD  Insulin Disposable Pump (OMNIPOD 5 G6 POD, GEN 5,) MISC 1 Device by Does not apply route every 3 (three) days. 11/17/22   [name], Melanie Crazier, MD  Insulin Pen Needle 32G X 4 MM MISC 1 Device by Does not apply route daily in the afternoon. 11/17/22   [name], Melanie Crazier, MD  lipase/protease/amylase (CREON) 36000 UNITS CPEP capsule Take 2 capsules (72,000 Units total) by mouth 3 (three) times daily before meals.  09/22/22   [name], Telford Nab., MD  omeprazole (PRILOSEC) 40 MG capsule Take 1 capsule (40 mg total) by mouth daily. 05/27/22   [name], Dinah C, NP  ondansetron (ZOFRAN) 8 MG tablet Take 1 tablet (8 mg total) by mouth every 8 (eight) hours as needed for nausea or vomiting. 04/23/22   Ezequiel Essex, MD  oxyCODONE 10 MG TABS Take 1 tablet (10 mg total) by mouth every 6 (six) hours as needed for up to 3 days for severe pain. 02/02/23 02/05/23  [name], Telford Nab., MD    Current Facility-Administered Medications  Medication Dose Route Frequency Provider Last Rate Last Admin   potassium chloride 10 mEq in 100 mL IVPB  10 mEq Intravenous Q1 Hr x 4 Domenic Moras, PA-C 90 mL/hr at 02/03/23 1434 10 mEq at 02/03/23 1434  Current Outpatient Medications  Medication Sig Dispense Refill   acetaminophen (TYLENOL) 325 MG tablet Take 2 tablets (650 mg total) by mouth every 6 (six) hours as needed for mild pain (or Fever >/= 101).     Continuous Blood Gluc Sensor (DEXCOM G6 SENSOR) MISC 1 Device by Other route as directed. Change sensors every 10 days 9 each 3   Continuous Blood Gluc Transmit (DEXCOM G6 TRANSMITTER) MISC 1 Device by Does not apply route as directed. 1 each 3   HYDROmorphone (DILAUDID) 4 MG tablet Take 1 tablet (4 mg total) by mouth every 4 (four) hours as needed for severe pain. 30 tablet 0   insulin aspart (NOVOLOG) 100 UNIT/ML injection 60 units a day 60 mL 3   Insulin Disposable Pump (OMNIPOD 5 G6 POD, GEN 5,) MISC 1 Device by Does not apply route every 3 (three) days. 6 each 3   Insulin Pen Needle 32G X 4 MM MISC 1 Device by Does not apply route daily in the afternoon. 100 each 3   lipase/protease/amylase (CREON) 36000 UNITS CPEP capsule Take 2 capsules (72,000 Units total) by mouth 3 (three) times daily before meals. 240 capsule 3   omeprazole (PRILOSEC) 40 MG capsule Take 1 capsule (40 mg total) by mouth daily. 90 capsule 1   ondansetron (ZOFRAN) 8 MG tablet Take 1 tablet (8 mg total)  by mouth every 8 (eight) hours as needed for nausea or vomiting. 30 tablet 0   oxyCODONE 10 MG TABS Take 1 tablet (10 mg total) by mouth every 6 (six) hours as needed for up to 3 days for severe pain. 12 tablet 0    Allergies as of 02/03/2023 - Review Complete 02/03/2023  Allergen Reaction Noted   Other Anaphylaxis 09/26/2020    Social History   Socioeconomic History   Marital status: Single    Spouse name: Not on file   Number of children: 6   Years of education: Not on file   Highest education level: Not on file  Occupational History   Occupation: forklift driver  Tobacco Use   Smoking status: Some Days    Types: Cigars    Start date: 11/08/2002   Smokeless tobacco: Never   Tobacco comments:    Once a week.  Vaping Use   Vaping Use: Never used  Substance and Sexual Activity   Alcohol use: Not Currently   Drug use: Never   Sexual activity: Not on file  Other Topics Concern   Not on file  Social History Narrative   Not on file   Social Determinants of Health   Financial Resource Strain: Not on file  Food Insecurity: No Food Insecurity (08/14/2022)   Hunger Vital Sign    Worried About Running Out of Food in the Last Year: Never true    Ran Out of Food in the Last Year: Never true  Transportation Needs: No Transportation Needs (08/14/2022)   PRAPARE - Hydrologist (Medical): No    Lack of Transportation (Non-Medical): No  Physical Activity: Not on file  Stress: Not on file  Social Connections: Not on file  Intimate Partner Violence: Not At Risk (08/14/2022)   Humiliation, Afraid, Rape, and Kick questionnaire    Fear of Current or Ex-Partner: No    Emotionally Abused: No    Physically Abused: No    Sexually Abused: No    Review of Systems: All systems reviewed and negative except where noted in HPI.  Physical Exam: Vital  signs in last 24 hours: Temp:  [98.2 F (36.8 C)] 98.2 F (36.8 C) (03/28 1210) Pulse Rate:  [78] 78 (03/28  1210) Resp:  [16] 16 (03/28 1210) BP: (148)/(105) 148/105 (03/28 1210) SpO2:  [100 %] 100 % (03/28 1210) Weight:  [75 kg] 75 kg (03/28 1211)    General:  Alert thin male in NAD Psych:  Pleasant, cooperative. Normal mood and affect Eyes: Pupils equal Ears:  Normal auditory acuity Nose: No deformity, discharge or lesions Neck:  Supple, no masses felt Lungs:  Clear to auscultation.  Heart:  Regular rate, regular rhythm.  Abdomen:  Soft, moderate generalized upper abdominal tenderness. Abdomen is nondistended, nontender, active bowel sounds, no masses felt. No CVA tenderness Rectal :  Deferred Msk: Symmetrical without gross deformities.  Neurologic:  Alert, oriented, grossly normal neurologically Extremities : No edema Skin:  Intact without significant lesions.    Intake/Output from previous day: No intake/output data recorded. Intake/Output this shift:  No intake/output data recorded.    Active Problems:   * No active hospital problems. Tye Savoy, NP-C @  02/03/2023, 3:05 PM   Attending physician's note   I have taken history, reviewed the chart and examined the patient. I performed a substantive portion of this encounter, including complete performance of at least one of the key components, in conjunction with the APP. I agree with the Advanced Practitioner's note, impression and recommendations.   Chronic (likely idiopathic) pancreatitis with PD stricture/stones s/p ERCP with PD stenting x 2 yesterday, now with likely post ERCP pancreatitis. Lip Nl d/t chronic pancreatitis.  Plan: -CT pancreatic protocol -IV fluids -Pain control -Replace electrolytes. -Trend CBC, CMP.  -Clear liquid diet -D/W Dr Rush Landmark.   Carmell Austria, MD [facility] [phone]

## 2023-02-03 NOTE — ED Triage Notes (Signed)
Patient had a pancreatic stent placed yesterday. Said his pain never went away, pain starts epigastric and radiates to the right side. Took 4mg  hydromorphone at 5am. Nauseous from the pain.

## 2023-02-03 NOTE — ED Notes (Signed)
Pt aware UA needed, urinal at bedside. 

## 2023-02-03 NOTE — ED Provider Notes (Signed)
EMERGENCY DEPARTMENT AT [facility] Provider Note   CSN: [id number] Arrival date & time: 02/03/23  1205     History  Chief Complaint  Carlos [name] presents with   Abdominal Pain    Carlos [name] is a 37 y.o. male.  The history is provided by the Carlos [name] and medical records. No language interpreter was used.  Abdominal Pain    37 year old male with significant history of chronic pancreatitis status post ERCP, was discharged home yesterday presenting back to the ED today with complaints of worsening abdominal pain.  Carlos [name] states despite being discharged home with opiate pain medication including hydromorphone, he took his pain medication but pain has not resolved.  Still endorse quite a bit of diffuse abdominal discomfort sharp and achy moderate intensity with associated nausea but no vomiting.  Denies having any fever or chills no chest pain or shortness of breath no urinary symptoms.  He did discuss his pain with his gastroenterologist.  Home Medications Prior to Admission medications   Medication Sig Start Date End Date Taking? Authorizing Provider  acetaminophen (TYLENOL) 325 MG tablet Take 2 tablets (650 mg total) by mouth every 6 (six) hours as needed for mild pain (or Fever >/= 101). 07/22/22   Samuella Cota, MD  Continuous Blood Gluc Sensor (DEXCOM G6 SENSOR) MISC 1 Device by Other route as directed. Change sensors every 10 days 11/17/22   [name], Melanie Crazier, MD  Continuous Blood Gluc Transmit (DEXCOM G6 TRANSMITTER) MISC 1 Device by Does not apply route as directed. 11/17/22   [name], Melanie Crazier, MD  HYDROmorphone (DILAUDID) 4 MG tablet Take 1 tablet (4 mg total) by mouth every 4 (four) hours as needed for severe pain. 01/31/23   Fargo, Amy E, NP  insulin aspart (NOVOLOG) 100 UNIT/ML injection 60 units a day 12/14/22   [name], Melanie Crazier, MD  Insulin Disposable Pump (OMNIPOD 5 G6 POD, GEN 5,) MISC 1 Device by Does not apply route every 3  (three) days. 11/17/22   [name], Melanie Crazier, MD  Insulin Pen Needle 32G X 4 MM MISC 1 Device by Does not apply route daily in the afternoon. 11/17/22   [name], Melanie Crazier, MD  lipase/protease/amylase (CREON) 36000 UNITS CPEP capsule Take 2 capsules (72,000 Units total) by mouth 3 (three) times daily before meals. 09/22/22   [name], Telford Nab., MD  omeprazole (PRILOSEC) 40 MG capsule Take 1 capsule (40 mg total) by mouth daily. 05/27/22   [name], Dinah C, NP  ondansetron (ZOFRAN) 8 MG tablet Take 1 tablet (8 mg total) by mouth every 8 (eight) hours as needed for nausea or vomiting. 04/23/22   Ezequiel Essex, MD  oxyCODONE 10 MG TABS Take 1 tablet (10 mg total) by mouth every 6 (six) hours as needed for up to 3 days for severe pain. 02/02/23 02/05/23  [name], Telford Nab., MD      Allergies    Other    Review of Systems   Review of Systems  Gastrointestinal:  Positive for abdominal pain.  All other systems reviewed and are negative.   Physical Exam Updated Vital Signs BP (!) 148/105   Pulse 78   Temp 98.2 F (36.8 C) (Oral)   Resp 16   Ht 6\' 5"  (1.956 m)   Wt 75 kg   SpO2 100%   BMI 19.61 kg/m  Physical Exam Vitals and nursing note reviewed.  Constitutional:      General: He is not in acute distress.    Appearance: He  is well-developed.  HENT:     Head: Atraumatic.  Eyes:     Conjunctiva/sclera: Conjunctivae normal.  Cardiovascular:     Rate and Rhythm: Normal rate and regular rhythm.  Pulmonary:     Effort: Pulmonary effort is normal.     Breath sounds: Normal breath sounds. No wheezing, rhonchi or rales.  Abdominal:     General: Abdomen is flat.     Palpations: Abdomen is soft.     Tenderness: There is generalized abdominal tenderness. There is no guarding or rebound.  Musculoskeletal:     Cervical back: Neck supple.  Skin:    Findings: No rash.  Neurological:     Mental Status: He is alert.     ED Results / Procedures / Treatments    Labs (all labs ordered are listed, but only abnormal results are displayed) Labs Reviewed  CBC WITH DIFFERENTIAL/PLATELET - Abnormal; Notable for the following components:      Result Value   RBC 3.97 (*)    MCV 104.5 (*)    MCH 35.3 (*)    All other components within normal limits  COMPREHENSIVE METABOLIC PANEL - Abnormal; Notable for the following components:   Sodium 133 (*)    Potassium 3.2 (*)    Chloride 96 (*)    Glucose, Bld 192 (*)    Calcium 8.6 (*)    Alkaline Phosphatase 131 (*)    All other components within normal limits  LIPASE, BLOOD  URINALYSIS, ROUTINE W REFLEX MICROSCOPIC    EKG None  Radiology DG ERCP  Result Date: 02/02/2023 CLINICAL DATA:  [id number] Surgery, elective [id number] EXAM: ERCP TECHNIQUE: Multiple spot images obtained with the fluoroscopic device and submitted for interpretation post-procedure. COMPARISON:  ERCP 12/01/2022 FINDINGS: Series of fluoroscopic spot images document endoscopic cannulation and removal of the previously placed pancreatic duct plastic pancreatic stent. Pullback balloon sweep of contrast injection through the pancreatic duct. Balloon angioplasty, and subsequently placement of 2 parallel pancreatic duct stents. IMPRESSION: 1. Pancreatic duct balloon angioplasty, and stent exchange. See procedure report for complete details. Electronically Signed   By: Lucrezia Europe M.D.   On: 02/02/2023 11:43   DG C-Arm 1-60 Min-No Report  Result Date: 02/02/2023 Fluoroscopy was utilized by the requesting physician.  No radiographic interpretation.    Procedures Procedures    Medications Ordered in ED Medications  HYDROmorphone (DILAUDID) injection 1 mg (has no administration in time range)  HYDROmorphone (DILAUDID) injection 1 mg (1 mg Intravenous Given 02/03/23 1231)  ondansetron (ZOFRAN) injection 4 mg (4 mg Intravenous Given 02/03/23 1231)  sodium chloride 0.9 % bolus 1,000 mL (1,000 mLs Intravenous New Bag/Given (Non-Interop) 02/03/23 1229)     ED Course/ Medical Decision Making/ A&P                             Medical Decision Making Amount and/or Complexity of Data Reviewed Labs: ordered.  Risk Prescription drug management.   BP (!) 148/105   Pulse 78   Temp 98.2 F (36.8 C) (Oral)   Resp 16   Ht 6\' 5"  (1.956 m)   Wt 75 kg   SpO2 100%   BMI 19.61 kg/m   Carlos [name]  37 year old male with significant history of chronic pancreatitis status post ERCP, was discharged home yesterday presenting back to the ED today with complaints of worsening abdominal pain.  Carlos [name] states despite being discharged home with opiate pain medication including hydromorphone, he took his pain  medication but pain has not resolved.  Still endorse quite a bit of diffuse abdominal discomfort sharp and achy moderate intensity with associated nausea but no vomiting.  Denies having any fever or chills no chest pain or shortness of breath no urinary symptoms.  He did discuss his pain with his gastroenterologist.  On exam, Carlos [name] is sitting in the chair appears slightly uncomfortable.  Heart with normal rate and rhythm lungs are clear to auscultation bilaterally abdomen is diffusely tender without guarding or rebound tenderness.  Vital signs notable for blood pressure of 140/105.  Carlos [name] is afebrile no hypoxia.  EMR review, Carlos [name] was managed by GI team, Dr. Rush Landmark and he underwent ERCP for pancreatitis treatment.  It was noted that Carlos [name] is at risk of having worsening pain due to significant pancreatic ductal work, and if his pain is not under control he may need to be admitted by the medicine team for  postprocedure ERCP pancreatic treatment.  -Labs ordered, independently viewed and interpreted by me.  Labs remarkable for CBG 192, K+ 3.2, will give supplementation -The Carlos [name] was maintained on a cardiac monitor.  I personally viewed and interpreted the cardiac monitored which showed an underlying rhythm of: NSR -Imaging including abd/pelvis  CT considered but not performed.   -This Carlos [name] presents to the ED for concern of abd pain, this involves an extensive number of treatment options, and is a complaint that carries with it a high risk of complications and morbidity.  The differential diagnosis includes post ERCP pain, post surgery complication -Co morbidities that complicate the Carlos [name] evaluation includes chronic pancreatitis, alcohol dependency, diabetes -Treatment includes dilaudid, IVF, zofran -Reevaluation of the Carlos [name] after these medicines showed that the Carlos [name] stayed the same -PCP office notes or outside notes reviewed -Discussion with specialist Dr. Renaee Munda who agrees to see and will admit pt for post ercp PAIN CONTROL -Escalation to admission/observation considered: Carlos [name] is in agreement for hospital admission.          Final Clinical Impression(s) / ED Diagnoses Final diagnoses:  Postoperative pain    Rx / DC Orders ED Discharge Orders     None         Domenic Moras, PA-C 02/03/23 1523    Elgie Congo, MD 02/03/23 [phone]

## 2023-02-03 NOTE — ED Notes (Signed)
ED TO INPATIENT HANDOFF REPORT  ED Nurse Name and Phone #: Jodell Cipro Name/Age/Gender Carlos [name] 37 y.o. male Room/Bed: WA16/WA16  Code Status   Code Status: Full Code  Home/SNF/Other Home Patient oriented to: self, place, and time Is this baseline? Yes   Triage Complete: Triage complete  Chief Complaint Acute pancreatitis [K85.90]  Triage Note Patient had a pancreatic stent placed yesterday. Said his pain never went away, pain starts epigastric and radiates to the right side. Took 4mg  hydromorphone at 5am. Nauseous from the pain.    Allergies Allergies  Allergen Reactions   Other Anaphylaxis    [name] - anaphylaxis    Level of Care/Admitting Diagnosis ED Disposition     ED Disposition  Admit   Condition  --   Comment  Hospital Area: [place] [[id number]]  Level of Care: Med-Surg [16]  May admit patient to [facility] or [facility] if equivalent level of care is available:: Yes  Covid Evaluation: Asymptomatic - no recent exposure (last 10 days) testing not required  Diagnosis: Acute pancreatitis [577.0.ICD-9-CM]  Admitting Physician: Lucillie Garfinkel [id number]  Attending Physician: Hollice Gong, MIR Kari.[place] [id number]  Certification:: I certify this patient will need inpatient services for at least 2 midnights  Estimated Length of Stay: 4          B Medical/Surgery History Past Medical History:  Diagnosis Date   AKI (acute kidney injury) ([place]) 12/21/2021   Anxiety    Diabetes ([place])    Pancreatitis    Splenic vein thrombosis    Past Surgical History:  Procedure Laterality Date   BILIARY DILATION  12/01/2022   Procedure: BILIARY DILATION;  Surgeon: Irving Copas., MD;  [facility];  Service: Gastroenterology;;   BIOPSY  12/02/2021   Procedure: BIOPSY;  Surgeon: Irving Copas., MD;  Location: [facility];  Service: Gastroenterology;;   ENDOSCOPIC RETROGRADE CHOLANGIOPANCREATOGRAPHY (ERCP) WITH PROPOFOL  N/A 08/12/2022   Procedure: ENDOSCOPIC RETROGRADE CHOLANGIOPANCREATOGRAPHY (ERCP) WITH PROPOFOL;  Surgeon: Irving Copas., MD;  Location: [facility];  Service: Gastroenterology;  Laterality: N/A;   ENDOSCOPIC RETROGRADE CHOLANGIOPANCREATOGRAPHY (ERCP) WITH PROPOFOL N/A 12/01/2022   Procedure: ENDOSCOPIC RETROGRADE CHOLANGIOPANCREATOGRAPHY (ERCP) WITH PROPOFOL;  Surgeon: Rush Landmark Telford Nab., MD;  Location: [facility];  Service: Gastroenterology;  Laterality: N/A;   ESOPHAGOGASTRODUODENOSCOPY N/A 12/01/2022   Procedure: ESOPHAGOGASTRODUODENOSCOPY (EGD);  Surgeon: Irving Copas., MD;  Location: [facility];  Service: Gastroenterology;  Laterality: N/A;   ESOPHAGOGASTRODUODENOSCOPY (EGD) WITH PROPOFOL N/A 12/02/2021   Procedure: ESOPHAGOGASTRODUODENOSCOPY (EGD) WITH PROPOFOL;  Surgeon: Rush Landmark Telford Nab., MD;  Location: [facility];  Service: Gastroenterology;  Laterality: N/A;   FINE NEEDLE ASPIRATION N/A 12/01/2022   Procedure: FINE NEEDLE ASPIRATION (FNA) LINEAR;  Surgeon: Irving Copas., MD;  Location: [facility];  Service: Gastroenterology;  Laterality: N/A;   NEUROLYTIC CELIAC PLEXUS  12/01/2022   Procedure: CELIAC PLEXUS BLOCK;  Surgeon: Rush Landmark Telford Nab., MD;  Location: [facility];  Service: Gastroenterology;;   NO PAST SURGERIES     PANCREATIC STENT PLACEMENT  08/12/2022   Procedure: PANCREATIC STENT PLACEMENT;  Surgeon: Irving Copas., MD;  Location: [facility];  Service: Gastroenterology;;   PANCREATIC STENT PLACEMENT  12/01/2022   Procedure: PANCREATIC STENT PLACEMENT;  Surgeon: Irving Copas., MD;  Location: [facility];  Service: Gastroenterology;;   REMOVAL OF STONES  12/01/2022   Procedure: REMOVAL OF STONES;  Surgeon: Irving Copas., MD;  Location: [facility];  Service: Gastroenterology;;   Joan Mayans  08/12/2022   Procedure:  SPHINCTEROTOMY;  Surgeon: Irving Copas., MD;  Location: [facility];   Service: Gastroenterology;;   Joan Mayans  12/01/2022   Procedure: Joan Mayans;  Surgeon: Rush Landmark Telford Nab., MD;  Location: [facility];  Service: Gastroenterology;;   Lavell Islam REMOVAL  12/01/2022   Procedure: STENT REMOVAL;  Surgeon: Irving Copas., MD;  Location: [facility];  Service: Gastroenterology;;   UPPER ESOPHAGEAL ENDOSCOPIC ULTRASOUND (EUS) N/A 12/02/2021   Procedure: UPPER ESOPHAGEAL ENDOSCOPIC ULTRASOUND (EUS);  Surgeon: Irving Copas., MD;  Location: [facility];  Service: Gastroenterology;  Laterality: N/A;   UPPER ESOPHAGEAL ENDOSCOPIC ULTRASOUND (EUS) N/A 12/01/2022   Procedure: UPPER ESOPHAGEAL ENDOSCOPIC ULTRASOUND (EUS);  Surgeon: Irving Copas., MD;  Location: [facility];  Service: Gastroenterology;  Laterality: N/A;     A IV Location/Drains/Wounds Patient Lines/Drains/Airways Status     Active Line/Drains/Airways     Name Placement date Placement time Site Days   Peripheral IV 02/03/23 20 G Left Antecubital 02/03/23  1228  Antecubital  less than 1   GI Stent 02/02/23  1008  --  1   GI Stent 02/02/23  1012  --  1            Intake/Output Last 24 hours  Intake/Output Summary (Last 24 hours) at 02/03/2023 1554 Last data filed at 02/03/2023 1552 Gross per 24 hour  Intake 1100 ml  Output --  Net 1100 ml    Labs/Imaging Results for orders placed or performed during the hospital encounter of 02/03/23 (from the past 48 hour(s))  CBC with Differential     Status: Abnormal   Collection Time: 02/03/23 12:29 PM  Result Value Ref Range   WBC 4.8 4.0 - 10.5 K/uL   RBC 3.97 (L) 4.22 - 5.81 MIL/uL   Hemoglobin 14.0 13.0 - 17.0 g/dL   HCT 41.5 39.0 - 52.0 %   MCV 104.5 (H) 80.0 - 100.0 fL   MCH 35.3 (H) 26.0 - 34.0 pg   MCHC 33.7 30.0 - 36.0 g/dL   RDW 11.7 11.5 - 15.5 %   Platelets 156 150 - 400 K/uL   nRBC 0.0 0.0 - 0.2 %   Neutrophils Relative % 63 %   Neutro Abs 3.0 1.7 - 7.7 K/uL   Lymphocytes Relative 22 %   Lymphs  Abs 1.0 0.7 - 4.0 K/uL   Monocytes Relative 12 %   Monocytes Absolute 0.6 0.1 - 1.0 K/uL   Eosinophils Relative 3 %   Eosinophils Absolute 0.2 0.0 - 0.5 K/uL   Basophils Relative 0 %   Basophils Absolute 0.0 0.0 - 0.1 K/uL   Immature Granulocytes 0 %   Abs Immature Granulocytes 0.02 0.00 - 0.07 K/uL    Comment: Performed at [facility], [place] [address] [place], [place] [address]  Comprehensive metabolic panel     Status: Abnormal   Collection Time: 02/03/23 12:29 PM  Result Value Ref Range   Sodium 133 (L) 135 - 145 mmol/L   Potassium 3.2 (L) 3.5 - 5.1 mmol/L   Chloride 96 (L) 98 - 111 mmol/L   CO2 26 22 - 32 mmol/L   Glucose, Bld 192 (H) 70 - 99 mg/dL    Comment: Glucose reference range applies only to samples taken after fasting for at least 8 hours.   BUN 10 6 - 20 mg/dL   Creatinine, Ser 0.80 0.61 - 1.24 mg/dL   Calcium 8.6 (L) 8.9 - 10.3 mg/dL   Total Protein 7.2 6.5 - 8.1 g/dL   Albumin 3.8  3.5 - 5.0 g/dL   AST 35 15 - 41 U/L   ALT 29 0 - 44 U/L   Alkaline Phosphatase 131 (H) 38 - 126 U/L   Total Bilirubin 0.7 0.3 - 1.2 mg/dL   GFR, Estimated >60 >60 mL/min    Comment: (NOTE) Calculated using the CKD-EPI Creatinine Equation (2021)    Anion gap 11 5 - 15    Comment: Performed at [facility], [place] [address], [place], [place] [address]  Lipase, blood     Status: None   Collection Time: 02/03/23 12:29 PM  Result Value Ref Range   Lipase 27 11 - 51 U/L    Comment: Performed at [facility], [place] [address], [place], [place] [address]  Urinalysis, Routine w reflex microscopic -Urine, Clean Catch     Status: Abnormal   Collection Time: 02/03/23  2:36 PM  Result Value Ref Range   Color, Urine YELLOW YELLOW   APPearance CLEAR CLEAR   Specific Gravity, Urine 1.010 1.005 - 1.030   pH 5.0 5.0 - 8.0   Glucose, UA >=500 (A) NEGATIVE mg/dL   Hgb urine dipstick NEGATIVE NEGATIVE   Bilirubin Urine NEGATIVE NEGATIVE    Ketones, ur 5 (A) NEGATIVE mg/dL   Protein, ur NEGATIVE NEGATIVE mg/dL   Nitrite NEGATIVE NEGATIVE   Leukocytes,Ua NEGATIVE NEGATIVE   RBC / HPF 0-5 0 - 5 RBC/hpf   WBC, UA 0-5 0 - 5 WBC/hpf   Bacteria, UA NONE SEEN NONE SEEN   Squamous Epithelial / HPF 0-5 0 - 5 /HPF   Mucus PRESENT     Comment: Performed at [facility], [place] [address], [place], [place] [address]   DG ERCP  Result Date: 02/02/2023 CLINICAL DATA:  [id number] Surgery, elective [id number] EXAM: ERCP TECHNIQUE: Multiple spot images obtained with the fluoroscopic device and submitted for interpretation post-procedure. COMPARISON:  ERCP 12/01/2022 FINDINGS: Series of fluoroscopic spot images document endoscopic cannulation and removal of the previously placed pancreatic duct plastic pancreatic stent. Pullback balloon sweep of contrast injection through the pancreatic duct. Balloon angioplasty, and subsequently placement of 2 parallel pancreatic duct stents. IMPRESSION: 1. Pancreatic duct balloon angioplasty, and stent exchange. See procedure report for complete details. Electronically Signed   By: Lucrezia Europe M.D.   On: 02/02/2023 11:43   DG C-Arm 1-60 Min-No Report  Result Date: 02/02/2023 Fluoroscopy was utilized by the requesting physician.  No radiographic interpretation.    Pending Labs Unresulted Labs (From admission, onward)     Start     Ordered   02/10/23 0500  Creatinine, serum  (enoxaparin (LOVENOX)    CrCl >/= 30 ml/min)  Weekly,   R     Comments: while on enoxaparin therapy    02/03/23 1536   02/04/23 0500  Comprehensive metabolic panel  Tomorrow morning,   R        02/03/23 1536   02/04/23 0500  CBC  Tomorrow morning,   R        02/03/23 1536   02/03/23 1535  CBC  (enoxaparin (LOVENOX)    CrCl >/= 30 ml/min)  Once,   R       Comments: Baseline for enoxaparin therapy IF NOT ALREADY DRAWN.  Notify MD if PLT < 100 K.    02/03/23 1536   02/03/23 1535  Creatinine, serum  (enoxaparin (LOVENOX)    CrCl  >/= 30 ml/min)  Once,   R       Comments: Baseline for  enoxaparin therapy IF NOT ALREADY DRAWN.    02/03/23 1536   02/03/23 1534  HIV Antibody (routine testing w rflx)  (HIV Antibody (Routine testing w reflex) panel)  Once,   R        02/03/23 1536            Vitals/Pain Today's Vitals   02/03/23 1210 02/03/23 1211  BP: (!) 148/105   Pulse: 78   Resp: 16   Temp: 98.2 F (36.8 C)   TempSrc: Oral   SpO2: 100%   Weight:  75 kg  Height:  6\' 5"  (1.956 m)    Isolation Precautions No active isolations  Medications Medications  potassium chloride 10 mEq in 100 mL IVPB (10 mEq Intravenous New Bag/Given 02/03/23 1553)  lactated ringers infusion (has no administration in time range)  ondansetron (ZOFRAN) injection 4 mg (has no administration in time range)  HYDROmorphone (DILAUDID) tablet 4 mg (has no administration in time range)  pantoprazole (PROTONIX) EC tablet 40 mg (has no administration in time range)  insulin aspart (novoLOG) injection 0-15 Units (has no administration in time range)  insulin aspart (novoLOG) injection 0-5 Units (has no administration in time range)  enoxaparin (LOVENOX) injection 40 mg (has no administration in time range)  0.9 % NaCl with KCl 20 mEq/ L  infusion (has no administration in time range)  HYDROmorphone (DILAUDID) injection 1 mg (has no administration in time range)  traZODone (DESYREL) tablet 25 mg (has no administration in time range)  docusate sodium (COLACE) capsule 100 mg (has no administration in time range)  polyethylene glycol (MIRALAX / GLYCOLAX) packet 17 g (has no administration in time range)  ondansetron (ZOFRAN) tablet 4 mg (has no administration in time range)    Or  ondansetron (ZOFRAN) injection 4 mg (has no administration in time range)  albuterol (PROVENTIL) (2.5 MG/3ML) 0.083% nebulizer solution 2.5 mg (has no administration in time range)  hydrALAZINE (APRESOLINE) injection 10 mg (has no administration in time range)   HYDROmorphone (DILAUDID) injection 1 mg (1 mg Intravenous Given 02/03/23 1231)  ondansetron (ZOFRAN) injection 4 mg (4 mg Intravenous Given 02/03/23 1231)  sodium chloride 0.9 % bolus 1,000 mL (0 mLs Intravenous Stopped 02/03/23 1552)  HYDROmorphone (DILAUDID) injection 1 mg (1 mg Intravenous Given 02/03/23 1430)    Mobility walks     Focused Assessments     R Recommendations: See Admitting Provider Note  Report given to:   Additional Notes:

## 2023-02-03 NOTE — Progress Notes (Signed)
Patient refused Lovenox injection, MD made aware.

## 2023-02-03 NOTE — H&P (Signed)
History and Physical  Carlos [name] [id number] DOB: 03/19/1986 DOA: 02/03/2023  PCP: [name], MD   Chief Complaint: Abd Pain   HPI: Carlos [name] is a 37 y.o. 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  He returns to the hospital today for uncontrolled pain, after having ERCP with removal of occluded PD stent yesterday, removal of PD stones and placement of 2 plastic stents across the PD stricture.  He says that he typically has his baseline level of pain about 6-7, and he discharged yesterday and this is where the pain was.  His gastroenterologist prescribed him oxycodone on top of his baseline oral Dilaudid, but the pharmacy would not let him fill it.  He returned to the hospital today due to the uncontrolled severe pain.  He denies any fevers, chills, vomiting though he did have nausea along with the pain.  Pain is typical for his pancreatitis.    ED Course: Vital signs in the ER have been stable, with some hypertension. Lab work is relatively unremarkable, with normal lipase.  Patient was given IV pain and nausea medication, started on IV fluids, and hospitalist was contacted for admission.  GI was also consulted.  Review of Systems: Please see HPI for pertinent positives and negatives. A complete 10 system review of systems are otherwise negative.  Past Medical History:  Diagnosis Date   AKI (acute kidney injury) ([place]) 12/21/2021   Anxiety    Diabetes ([place])    Pancreatitis    Splenic vein thrombosis    Past Surgical History:  Procedure Laterality Date   BILIARY DILATION  12/01/2022   Procedure: BILIARY DILATION;  Surgeon: Rush Landmark Telford Nab., MD;  Location: [facility];  Service: Gastroenterology;;   BIOPSY  12/02/2021   Procedure: BIOPSY;  Surgeon: Irving Copas., MD;  Location: [facility];  Service: Gastroenterology;;    ENDOSCOPIC RETROGRADE CHOLANGIOPANCREATOGRAPHY (ERCP) WITH PROPOFOL N/A 08/12/2022   Procedure: ENDOSCOPIC RETROGRADE CHOLANGIOPANCREATOGRAPHY (ERCP) WITH PROPOFOL;  Surgeon: Irving Copas., MD;  Location: [facility];  Service: Gastroenterology;  Laterality: N/A;   ENDOSCOPIC RETROGRADE CHOLANGIOPANCREATOGRAPHY (ERCP) WITH PROPOFOL N/A 12/01/2022   Procedure: ENDOSCOPIC RETROGRADE CHOLANGIOPANCREATOGRAPHY (ERCP) WITH PROPOFOL;  Surgeon: Rush Landmark Telford Nab., MD;  Location: [facility];  Service: Gastroenterology;  Laterality: N/A;   ESOPHAGOGASTRODUODENOSCOPY N/A 12/01/2022   Procedure: ESOPHAGOGASTRODUODENOSCOPY (EGD);  Surgeon: Irving Copas., MD;  Location: [facility];  Service: Gastroenterology;  Laterality: N/A;   ESOPHAGOGASTRODUODENOSCOPY (EGD) WITH PROPOFOL N/A 12/02/2021   Procedure: ESOPHAGOGASTRODUODENOSCOPY (EGD) WITH PROPOFOL;  Surgeon: Rush Landmark Telford Nab., MD;  Location: [facility];  Service: Gastroenterology;  Laterality: N/A;   FINE NEEDLE ASPIRATION N/A 12/01/2022   Procedure: FINE NEEDLE ASPIRATION (FNA) LINEAR;  Surgeon: Irving Copas., MD;  Location: [facility];  Service: Gastroenterology;  Laterality: N/A;   NEUROLYTIC CELIAC PLEXUS  12/01/2022   Procedure: CELIAC PLEXUS BLOCK;  Surgeon: Rush Landmark Telford Nab., MD;  Location: [facility];  Service: Gastroenterology;;   NO PAST SURGERIES     PANCREATIC STENT PLACEMENT  08/12/2022   Procedure: PANCREATIC STENT PLACEMENT;  Surgeon: Irving Copas., MD;  Location: [facility];  Service: Gastroenterology;;   PANCREATIC STENT PLACEMENT  12/01/2022   Procedure: PANCREATIC STENT PLACEMENT;  Surgeon: Irving Copas., MD;  Location: [facility];  Service: Gastroenterology;;   REMOVAL OF STONES  12/01/2022   Procedure: REMOVAL OF STONES;  Surgeon: Irving Copas., MD;  Location: [facility];  Service: Gastroenterology;;   Joan Mayans  08/12/2022   Procedure:  Joan Mayans;  Surgeon: Irving Copas., MD;  Location: [facility];  Service: Gastroenterology;;   Joan Mayans  12/01/2022   Procedure: Joan Mayans;  Surgeon: Rush Landmark Telford Nab., MD;  Location: [facility];  Service: Gastroenterology;;   Lavell Islam REMOVAL  12/01/2022   Procedure: STENT REMOVAL;  Surgeon: Irving Copas., MD;  Location: [facility];  Service: Gastroenterology;;   UPPER ESOPHAGEAL ENDOSCOPIC ULTRASOUND (EUS) N/A 12/02/2021   Procedure: UPPER ESOPHAGEAL ENDOSCOPIC ULTRASOUND (EUS);  Surgeon: Irving Copas., MD;  Location: [facility];  Service: Gastroenterology;  Laterality: N/A;   UPPER ESOPHAGEAL ENDOSCOPIC ULTRASOUND (EUS) N/A 12/01/2022   Procedure: UPPER ESOPHAGEAL ENDOSCOPIC ULTRASOUND (EUS);  Surgeon: Irving Copas., MD;  Location: [facility];  Service: Gastroenterology;  Laterality: N/A;    Social History:  reports that he has been smoking cigars. He started smoking about 20 years ago. He has never used smokeless tobacco. He reports that he does not currently use alcohol. He reports that he does not use drugs.   Allergies  Allergen Reactions   Other Anaphylaxis    [name] - anaphylaxis    Family History  Problem Relation Age of Onset   Hypertension Mother    Alcoholism Maternal Uncle    Diabetes Maternal Grandmother    Diabetes Maternal Grandfather    Diabetes Paternal Grandmother    Diabetes Paternal Grandfather    Colon cancer Neg Hx    Esophageal cancer Neg Hx    Inflammatory bowel disease Neg Hx    Liver disease Neg Hx    Pancreatic cancer Neg Hx    Rectal cancer Neg Hx    Stomach cancer Neg Hx      Prior to Admission medications   Medication Sig Start Date End Date Taking? Authorizing Provider  acetaminophen (TYLENOL) 325 MG tablet Take 2 tablets (650 mg total) by mouth every 6 (six) hours as needed for mild pain (or Fever >/= 101). 07/22/22   Samuella Cota, MD  Continuous Blood Gluc Sensor  (DEXCOM G6 SENSOR) MISC 1 Device by Other route as directed. Change sensors every 10 days 11/17/22   [name], Melanie Crazier, MD  Continuous Blood Gluc Transmit (DEXCOM G6 TRANSMITTER) MISC 1 Device by Does not apply route as directed. 11/17/22   [name], Melanie Crazier, MD  HYDROmorphone (DILAUDID) 4 MG tablet Take 1 tablet (4 mg total) by mouth every 4 (four) hours as needed for severe pain. 01/31/23   Fargo, Amy E, NP  insulin aspart (NOVOLOG) 100 UNIT/ML injection 60 units a day 12/14/22   [name], Melanie Crazier, MD  Insulin Disposable Pump (OMNIPOD 5 G6 POD, GEN 5,) MISC 1 Device by Does not apply route every 3 (three) days. 11/17/22   [name], Melanie Crazier, MD  Insulin Pen Needle 32G X 4 MM MISC 1 Device by Does not apply route daily in the afternoon. 11/17/22   [name], Melanie Crazier, MD  lipase/protease/amylase (CREON) 36000 UNITS CPEP capsule Take 2 capsules (72,000 Units total) by mouth 3 (three) times daily before meals. 09/22/22   [name], Telford Nab., MD  omeprazole (PRILOSEC) 40 MG capsule Take 1 capsule (40 mg total) by mouth daily. 05/27/22   [name], Dinah C, NP  ondansetron (ZOFRAN) 8 MG tablet Take 1 tablet (8 mg total) by mouth every 8 (eight) hours as needed for nausea or vomiting. 04/23/22   Ezequiel Essex, MD  oxyCODONE 10 MG TABS Take 1 tablet (10 mg total) by mouth every 6 (six) hours  as needed for up to 3 days for severe pain. 02/02/23 02/05/23  [name], Telford Nab., MD    Physical Exam: BP (!) 148/105   Pulse 78   Temp 98.2 F (36.8 C) (Oral)   Resp 16   Ht 6\' 5"  (1.956 m)   Wt 75 kg   SpO2 100%   BMI 19.61 kg/m   General:  Alert, oriented, calm, in no acute distress, pleasant Eyes: EOMI, clear conjuctivae, white sclerea Neck: supple, no masses, trachea mildline  Cardiovascular: RRR, no murmurs or rubs, no peripheral edema  Respiratory: clear to auscultation bilaterally, no wheezes, no crackles  Abdomen: soft, nontender, nondistended, normal  bowel tones heard  Skin: dry, no rashes  Musculoskeletal: no joint effusions, normal range of motion  Psychiatric: appropriate affect, normal speech  Neurologic: extraocular muscles intact, clear speech, moving all extremities with intact sensorium          Labs on Admission:  Basic Metabolic Panel: Recent Labs  Lab 02/03/23 1229  NA 133*  K 3.2*  CL 96*  CO2 26  GLUCOSE 192*  BUN 10  CREATININE 0.80  CALCIUM 8.6*   Liver Function Tests: Recent Labs  Lab 02/03/23 1229  AST 35  ALT 29  ALKPHOS 131*  BILITOT 0.7  PROT 7.2  ALBUMIN 3.8   Recent Labs  Lab 02/03/23 1229  LIPASE 27   No results for input(s): "AMMONIA" in the last 168 hours. CBC: Recent Labs  Lab 02/03/23 1229  WBC 4.8  NEUTROABS 3.0  HGB 14.0  HCT 41.5  MCV 104.5*  PLT 156   Cardiac Enzymes: No results for input(s): "CKTOTAL", "CKMB", "CKMBINDEX", "TROPONINI" in the last 168 hours.  BNP (last 3 results) No results for input(s): "BNP" in the last 8760 hours.  ProBNP (last 3 results) No results for input(s): "PROBNP" in the last 8760 hours.  CBG: Recent Labs  Lab 02/02/23 0811 02/02/23 1104  GLUCAP 187* 251*    Radiological Exams on Admission: DG ERCP  Result Date: 02/02/2023 CLINICAL DATA:  [id number] Surgery, elective [id number] EXAM: ERCP TECHNIQUE: Multiple spot images obtained with the fluoroscopic device and submitted for interpretation post-procedure. COMPARISON:  ERCP 12/01/2022 FINDINGS: Series of fluoroscopic spot images document endoscopic cannulation and removal of the previously placed pancreatic duct plastic pancreatic stent. Pullback balloon sweep of contrast injection through the pancreatic duct. Balloon angioplasty, and subsequently placement of 2 parallel pancreatic duct stents. IMPRESSION: 1. Pancreatic duct balloon angioplasty, and stent exchange. See procedure report for complete details. Electronically Signed   By: Lucrezia Europe M.D.   On: 02/02/2023 11:43   DG C-Arm 1-60  Min-No Report  Result Date: 02/02/2023 Fluoroscopy was utilized by the requesting physician.  No radiographic interpretation.    Assessment/Plan Active Problems:   Acute on chronic pancreatitis (HCC)-due to ERCP yesterday 3/27 with balloon dilation, stone removal, and stent replacement.  Unfortunately his pain cannot be controlled with oral medications after discharge home yesterday. -Inpatient admission -Continue his baseline chronic pain medication with Dilaudid 4 mg p.o. every 4 hours, will add IV Dilaudid 1 mg every 2 hours as needed severe pain -Clear liquid diet only for now -CT abdomen pancreas protocol per GI -Discussed with Dr. Lyndel Safe has been seen by GI   History of ERCP   Stricture of pancreatic duct   Acute pancreatitis   Diabetes mellitus type 1 (HCC)-patient normally wears insulin pump, but is currently off.  He took 16 units of basal Lantus this morning. -Will start basal Lantus at  10 units tomorrow, as his p.o. intake is significantly reduced -Moderate dose sliding scale insulin before every meal and at bedtime in addition  DVT prophylaxis: Lovenox     Code Status: Full Code  Consults called: None  Admission status: The appropriate patient status for this patient is INPATIENT. Inpatient status is judged to be reasonable and necessary in order to provide the required intensity of service to ensure the patient's safety. The patient's presenting symptoms, physical exam findings, and initial radiographic and laboratory data in the context of their chronic comorbidities is felt to place them at high risk for further clinical deterioration. Furthermore, it is not anticipated that the patient will be medically stable for discharge from the hospital within 2 midnights of admission.    I certify that at the point of admission it is my clinical judgment that the patient will require inpatient hospital care spanning beyond 2 midnights from the point of admission due to high intensity  of service, high risk for further deterioration and high frequency of surveillance required   Time spent: 46 minutes  Carlos [name] Neva Seat MD [facility] Pager [phone]  If 7PM-7AM, please contact night-coverage [email or web address] Password [facility]  02/03/2023, 4:17 PM

## 2023-02-04 ENCOUNTER — Encounter: Payer: Self-pay | Admitting: Gastroenterology

## 2023-02-04 ENCOUNTER — Encounter (HOSPITAL_COMMUNITY): Payer: Self-pay | Admitting: Gastroenterology

## 2023-02-04 DIAGNOSIS — K859 Acute pancreatitis without necrosis or infection, unspecified: Secondary | ICD-10-CM | POA: Diagnosis not present

## 2023-02-04 DIAGNOSIS — Z9889 Other specified postprocedural states: Secondary | ICD-10-CM | POA: Diagnosis not present

## 2023-02-04 DIAGNOSIS — K8689 Other specified diseases of pancreas: Secondary | ICD-10-CM | POA: Diagnosis not present

## 2023-02-04 DIAGNOSIS — K861 Other chronic pancreatitis: Secondary | ICD-10-CM | POA: Diagnosis not present

## 2023-02-04 DIAGNOSIS — E109 Type 1 diabetes mellitus without complications: Secondary | ICD-10-CM | POA: Diagnosis not present

## 2023-02-04 LAB — CBC
HCT: 40.4 % (ref 39.0–52.0)
Hemoglobin: 13.3 g/dL (ref 13.0–17.0)
MCH: 35.3 pg — ABNORMAL HIGH (ref 26.0–34.0)
MCHC: 32.9 g/dL (ref 30.0–36.0)
MCV: 107.2 fL — ABNORMAL HIGH (ref 80.0–100.0)
Platelets: 150 10*3/uL (ref 150–400)
RBC: 3.77 MIL/uL — ABNORMAL LOW (ref 4.22–5.81)
RDW: 11.7 % (ref 11.5–15.5)
WBC: 3.9 10*3/uL — ABNORMAL LOW (ref 4.0–10.5)
nRBC: 0 % (ref 0.0–0.2)

## 2023-02-04 LAB — COMPREHENSIVE METABOLIC PANEL
ALT: 23 U/L (ref 0–44)
AST: 25 U/L (ref 15–41)
Albumin: 3.2 g/dL — ABNORMAL LOW (ref 3.5–5.0)
Alkaline Phosphatase: 117 U/L (ref 38–126)
Anion gap: 9 (ref 5–15)
BUN: 7 mg/dL (ref 6–20)
CO2: 23 mmol/L (ref 22–32)
Calcium: 8.3 mg/dL — ABNORMAL LOW (ref 8.9–10.3)
Chloride: 99 mmol/L (ref 98–111)
Creatinine, Ser: 0.77 mg/dL (ref 0.61–1.24)
GFR, Estimated: >60 mL/min (ref >60)
Glucose, Bld: 179 mg/dL — ABNORMAL HIGH (ref 70–99)
Potassium: 4.1 mmol/L (ref 3.5–5.1)
Sodium: 131 mmol/L — ABNORMAL LOW (ref 135–145)
Total Bilirubin: 0.7 mg/dL (ref 0.3–1.2)
Total Protein: 6.3 g/dL — ABNORMAL LOW (ref 6.5–8.1)

## 2023-02-04 LAB — GLUCOSE, CAPILLARY
Glucose-Capillary: 189 mg/dL — ABNORMAL HIGH (ref 70–99)
Glucose-Capillary: 121 mg/dL — ABNORMAL HIGH (ref 70–99)
Glucose-Capillary: 199 mg/dL — ABNORMAL HIGH (ref 70–99)
Glucose-Capillary: 163 mg/dL — ABNORMAL HIGH (ref 70–99)

## 2023-02-04 MED ORDER — SODIUM CHLORIDE 0.9 % IV SOLN: INTRAVENOUS | Status: DC

## 2023-02-04 NOTE — TOC CM/SW Note (Signed)
Transition of Care [facility]) Screening Note  Patient Details  Name: Carlos [name] Date of Birth: [birth year]  Transition of Care [facility]) CM/SW Contact:    Sherie Don, LCSW Phone Number: 02/04/2023, 9:37 AM  Transition of Care Department [facility]) has reviewed patient and no TOC needs have been identified at this time. We will continue to monitor patient advancement through interdisciplinary progression rounds. If new patient transition needs arise, please place a TOC consult.

## 2023-02-04 NOTE — Hospital Course (Addendum)
37 year old man PMH including chronic pancreatitis with complications including PD stricture, pseudocyst, splenic vein thrombosis, insulin-dependent diabetes on insulin pump, presented with uncontrolled pain after ERCP. Imaging showed acute pancreatitis.

## 2023-02-04 NOTE — Progress Notes (Signed)
Progress Note    ASSESSMENT AND PLAN:   Post ERCP pancreatitis Chronic (likely idiopathic) pancreatitis with PD stricture/stones s/p ERCP with PD stenting x 2 (3/27) [place] of previous splenic vein thrombosis.  No active thrombosis.   Plan: -Advance to full liquids -Pain control -Ambulate -Trend labs -Will again assess tomorrow  SUBJECTIVE   Still in pain. Tolerating clear liquids. CT reviewed   OBJECTIVE:     Vital signs in last 24 hours: Temp:  [98.2 F (36.8 C)-98.6 F (37 C)] 98.2 F (36.8 C) (03/29 0559) Pulse Rate:  [66-89] 66 (03/29 0559) Resp:  [16-18] 18 (03/29 0559) BP: (121-148)/(79-105) 136/98 (03/29 0559) SpO2:  [99 %-100 %] 100 % (03/29 0559) Weight:  [75 kg] 75 kg (03/28 1211) Last BM Date : 02/04/23 General:   Alert, well-developed male in NAD EENT:  Normal hearing, non icteric sclera, conjunctive pink.  Heart:  Regular rate and rhythm; no murmur.  No lower extremity edema   Pulm: Normal respiratory effort, lungs CTA bilaterally without wheezes or crackles. Abdomen:  Soft, nondistended, epigastric tenderness.  Normal bowel sounds,.       Neurologic:  Alert and  oriented x4;  grossly normal neurologically. Psych:  Pleasant, cooperative.  Normal mood and affect.   Intake/Output from previous day: 03/28 0701 - 03/29 0700 In: 3177.5 [P.O.:90; I.V.:1886.9; IV Piggyback:1200.6] Out: 900 [Urine:900] Intake/Output this shift: No intake/output data recorded.  Lab Results: Recent Labs    02/03/23 1229 02/03/23 1646 02/04/23 0430  WBC 4.8 4.4 3.9*  HGB 14.0 14.8 13.3  HCT 41.5 45.6 40.4  PLT 156 154 150   BMET Recent Labs    02/03/23 1229 02/03/23 1646 02/04/23 0430  NA 133*  --  131*  K 3.2*  --  4.1  CL 96*  --  99  CO2 26  --  23  GLUCOSE 192*  --  179*  BUN 10  --  7  CREATININE 0.80 0.61 0.77  CALCIUM 8.6*  --  8.3*   LFT Recent Labs    02/04/23 0430  PROT 6.3*  ALBUMIN 3.2*  AST 25  ALT 23  ALKPHOS 117   BILITOT 0.7   PT/INR No results for input(s): "LABPROT", "INR" in the last 72 hours. Hepatitis Panel No results for input(s): "HEPBSAG", "HCVAB", "HEPAIGM", "HEPBIGM" in the last 72 hours.  CT ABD PELVIS W/WO CM ONCOLOGY PANCREATIC PROTOCOL  Result Date: 02/03/2023 CLINICAL DATA:  Acute severe pancreatitis. Diabetes. 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 EXAM: CT ABDOMEN AND PELVIS WITHOUT AND WITH CONTRAST TECHNIQUE: Multidetector CT imaging of the abdomen and pelvis was performed following the standard protocol before and following the bolus administration of intravenous contrast. Unenhanced and arterial phase enhanced images of the abdomen are obtained followed by portal venous phase enhanced images of the abdomen and pelvis. RADIATION DOSE REDUCTION: This exam was performed according to the departmental dose-optimization program which includes automated exposure control, adjustment of the mA and/or kV according to patient size and/or use of iterative reconstruction technique. CONTRAST:  154mL OMNIPAQUE IOHEXOL 300 MG/ML  SOLN COMPARISON:  CT abdomen and pelvis 11/11/2022. MRI abdomen 11/26/2022 FINDINGS: Lower chest: Lung bases are clear. Hepatobiliary: The gallbladder is contracted with small stones present. Mild pericholecystic edema. Mild intra and extrahepatic bile duct dilatation. No radiopaque common duct stones are identified. Pancreas: A pancreatic ductal stent is present. No pancreatic ductal dilatation. The head and body of the pancreas are  enlarged and hypodense with hypoenhancement. Scattered parenchymal calcifications are present. Changes are consistent with acute on chronic pancreatitis. Hypoenhancing areas may indicate pancreatic necrosis. This represents progression since the previous study. Minimal stranding and edema around the head of the pancreas. Low-attenuation areas seen previously in the pancreatic  tail is less prominent. Spleen: Normal in size without focal abnormality. Adrenals/Urinary Tract: Adrenal glands are unremarkable. Kidneys are normal, without renal calculi, focal lesion, or hydronephrosis. Bladder is unremarkable. Stomach/Bowel: Stomach, small bowel, and colon are mostly decompressed with scattered stool in the colon. No wall thickening or inflammatory changes. Appendix is normal. 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 No discrete venous thrombus is identified. The abdominal aorta, celiac axis, superior mesenteric artery, bilateral renal arteries, and inferior mesenteric artery are patent without focal thrombus demonstrated. Reproductive: Prostate is unremarkable. Other: No free air or free fluid in the abdomen. Abdominal wall musculature appears intact. Musculoskeletal: No acute or significant osseous findings. IMPRESSION: 1. Enlarged and edematous appearance of the pancreas consistent with acute edematous pancreatitis. Mild edema and stranding around the pancreatic head. Hypoenhancement of the head and body of the pancreas may indicate pancreatic necrosis. No loculated collections are identified. 2. 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 Splenic and portal veins appear patent without evidence of significant thrombus. Patent abdominal aorta and visceral branch vessels. 3. Stent is in place in the pancreatic duct without pancreatic ductal dilatation. 4. Calcification in the pancreas consistent with underlying chronic pancreatitis. 5. Cholelithiasis with contracted gallbladder. Mild bile duct dilatation without cause identified, possibly reactive or occult common duct stone. Electronically Signed   By: Lucienne Capers M.D.   On: 02/03/2023 23:41   DG ERCP  Result Date: 02/02/2023 CLINICAL DATA:   [id number] Surgery, elective [id number] EXAM: ERCP TECHNIQUE: Multiple spot images obtained with the fluoroscopic device and submitted for interpretation post-procedure. COMPARISON:  ERCP 12/01/2022 FINDINGS: Series of fluoroscopic spot images document endoscopic cannulation and removal of the previously placed pancreatic duct plastic pancreatic stent. Pullback balloon sweep of contrast injection through the pancreatic duct. Balloon angioplasty, and subsequently placement of 2 parallel pancreatic duct stents. IMPRESSION: 1. Pancreatic duct balloon angioplasty, and stent exchange. See procedure report for complete details. Electronically Signed   By: Lucrezia Europe M.D.   On: 02/02/2023 11:43   DG C-Arm 1-60 Min-No Report  Result Date: 02/02/2023 Fluoroscopy was utilized by the requesting physician.  No radiographic interpretation.     Active Problems:   Abdominal pain, chronic, epigastric   Acute on chronic pancreatitis ([place])   History of ERCP   Stricture of pancreatic duct   Acute pancreatitis   Diabetes mellitus type 1 ([place])     LOS: 1 day     Carmell Austria, MD 02/04/2023, 9:47 AM [facility] [phone]

## 2023-02-04 NOTE — Progress Notes (Signed)
  Progress Note   Patient: Carlos [name] [id number] DOB: [birth year] DOA: 02/03/2023     1 DOS: the patient was seen and examined on 02/04/2023   Brief hospital course: 37 year old man PMH including chronic pancreatitis with complications including PD stricture, pseudocyst, splenic vein thrombosis, insulin-dependent diabetes on insulin pump, presented with uncontrolled pain after ERCP. Imaging showed acute pancreatitis.  Assessment and Plan: Acute on chronic pancreatitis (HCC)-due to ERCP 3/27 with balloon dilation, stone removal, and stent replacement.   Continues to have pain.  Seen by gastroenterology with recommendation for full liquids, pain control, trend labs.  Will continue aggressive fluids.  I have reached out to discuss CT findings with Dr. Lyndel Safe.  Patient appears clinically stable.  Diabetes mellitus type 1 (HCC)-patient normally wears insulin pump, but is currently off.  CBG stable.  Continue basal insulin.  Sliding scale insulin.  Can resume pump on discharge or sooner if desired.      Subjective:  Still has epigastric abdominal pain  Physical Exam: Vitals:   02/03/23 2040 02/04/23 0114 02/04/23 0559 02/04/23 1329  BP: (!) 135/95 121/79 (!) 136/98 (!) 142/98  Pulse: 71 68 66 66  Resp: 18 18 18    Temp: 98.4 F (36.9 C) 98.6 F (37 C) 98.2 F (36.8 C) 98 F (36.7 C)  TempSrc: Oral Oral Oral Oral  SpO2: 100% 99% 100% 100%  Weight:      Height:       Physical Exam Vitals reviewed.  Constitutional:      General: He is not in acute distress.    Appearance: He is not ill-appearing or toxic-appearing.  Cardiovascular:     Rate and Rhythm: Normal rate and regular rhythm.     Heart sounds: No murmur heard. Pulmonary:     Effort: Pulmonary effort is normal. No respiratory distress.     Breath sounds: No wheezing, rhonchi or rales.  Abdominal:     Palpations: Abdomen is soft.     Tenderness: There is abdominal tenderness (epigastric).  Musculoskeletal:     Right lower  leg: No edema.     Left lower leg: No edema.  Neurological:     Mental Status: He is alert.  Psychiatric:        Mood and Affect: Mood normal.        Behavior: Behavior normal.     Data Reviewed: CBG stable CMP noted WBC 3.9  Family Communication:   Disposition: Status is: Inpatient Remains inpatient appropriate because: acute pancreatitis  Planned Discharge Destination: Home    Time spent: 35 minutes  Author: Murray Hodgkins, MD 02/04/2023 3:22 PM  For on call review [email or web address].

## 2023-02-05 ENCOUNTER — Other Ambulatory Visit: Payer: Self-pay | Admitting: Orthopedic Surgery

## 2023-02-05 DIAGNOSIS — G8929 Other chronic pain: Secondary | ICD-10-CM

## 2023-02-05 DIAGNOSIS — E109 Type 1 diabetes mellitus without complications: Secondary | ICD-10-CM | POA: Diagnosis not present

## 2023-02-05 DIAGNOSIS — K861 Other chronic pancreatitis: Secondary | ICD-10-CM | POA: Diagnosis not present

## 2023-02-05 DIAGNOSIS — K8689 Other specified diseases of pancreas: Secondary | ICD-10-CM | POA: Diagnosis not present

## 2023-02-05 DIAGNOSIS — K859 Acute pancreatitis without necrosis or infection, unspecified: Secondary | ICD-10-CM | POA: Diagnosis not present

## 2023-02-05 LAB — CBC
HCT: 39.4 % (ref 39.0–52.0)
Hemoglobin: 13.1 g/dL (ref 13.0–17.0)
MCH: 34.7 pg — ABNORMAL HIGH (ref 26.0–34.0)
MCHC: 33.2 g/dL (ref 30.0–36.0)
MCV: 104.2 fL — ABNORMAL HIGH (ref 80.0–100.0)
Platelets: 154 10*3/uL (ref 150–400)
RBC: 3.78 MIL/uL — ABNORMAL LOW (ref 4.22–5.81)
RDW: 11.5 % (ref 11.5–15.5)
WBC: 4 10*3/uL (ref 4.0–10.5)
nRBC: 0 % (ref 0.0–0.2)

## 2023-02-05 LAB — COMPREHENSIVE METABOLIC PANEL
ALT: 17 U/L (ref 0–44)
AST: 15 U/L (ref 15–41)
Albumin: 3.1 g/dL — ABNORMAL LOW (ref 3.5–5.0)
Alkaline Phosphatase: 102 U/L (ref 38–126)
Anion gap: 9 (ref 5–15)
BUN: 6 mg/dL (ref 6–20)
CO2: 23 mmol/L (ref 22–32)
Calcium: 8.2 mg/dL — ABNORMAL LOW (ref 8.9–10.3)
Chloride: 101 mmol/L (ref 98–111)
Creatinine, Ser: 0.76 mg/dL (ref 0.61–1.24)
GFR, Estimated: >60 mL/min (ref >60)
Glucose, Bld: 197 mg/dL — ABNORMAL HIGH (ref 70–99)
Potassium: 3.5 mmol/L (ref 3.5–5.1)
Sodium: 133 mmol/L — ABNORMAL LOW (ref 135–145)
Total Bilirubin: 0.8 mg/dL (ref 0.3–1.2)
Total Protein: 5.9 g/dL — ABNORMAL LOW (ref 6.5–8.1)

## 2023-02-05 LAB — LIPASE, BLOOD: Lipase: 26 U/L (ref 11–51)

## 2023-02-05 LAB — GLUCOSE, CAPILLARY
Glucose-Capillary: 207 mg/dL — ABNORMAL HIGH (ref 70–99)
Glucose-Capillary: 266 mg/dL — ABNORMAL HIGH (ref 70–99)

## 2023-02-05 NOTE — Discharge Summary (Addendum)
Physician Discharge Summary   Patient: Carlos [name] MRN: [id number] DOB: 1986/06/20  Admit date:     02/03/2023  Discharge date: 02/05/23  Discharge Physician: Murray Hodgkins   PCP: [name], MD   Recommendations at discharge:  {Acute on chronic pancreatitis (HCC)-due to ERCP 3/27 with balloon dilation, stone removal, and stent replacement.   Follow-up with PCP and GI Repeat ERCP in 2-3 months to exchange stents.  Low fat diet.   Discharge Diagnoses: Active Problems:   Abdominal pain, chronic, epigastric   Acute on chronic pancreatitis (HCC)   History of ERCP   Stricture of pancreatic duct   Acute pancreatitis   Diabetes mellitus type 1 ([place])  Resolved Problems:   * No resolved hospital problems. *  Hospital Course: 37 year old man PMH including chronic pancreatitis with complications including PD stricture, pseudocyst, splenic vein thrombosis, insulin-dependent diabetes on insulin pump, presented with uncontrolled pain after ERCP. Imaging showed acute pancreatitis.  Seen by gastroenterology and condition gradually improved.  Cleared for discharge.  Acute on chronic pancreatitis (HCC)-due to ERCP 3/27 with balloon dilation, stone removal, and stent replacement.   Seen by gastroenterology. I discussed imaging findings with Dr. Lyndel Safe including possible necrosis.  He felt the patient would do well can discharge home safely.   Patient tolerated diet and has been cleared for discharge by GI.   Follow-up with PCP and GI Repeat ERCP in 2-3 months to exchange stents.  Low fat diet.    Diabetes mellitus type 1 (HCC)-patient normally wears insulin pump, but is currently off.  CBG stable.  Resume insulin pump on discharge.       Pain control - [employer] Controlled Substance Reporting System database was reviewed.  Patient was prescribed hydromorphone 4 mg  3/25 #30 3/19 #30 3/12 #30 2/27 #90  And per Dr. Stefani Dama Roddy's note 3/27, he was prescribed oxycodone 10 mg  every 6 hours as needed  No further pain medication given  Procedures performed: none  Disposition: Home Diet recommendation:  Low fat diet DISCHARGE MEDICATION: Allergies as of 02/05/2023       Reactions   Other Anaphylaxis, Other (See Comments)   [name] - anaphylaxis        Medication List     STOP taking these medications    Aleve 220 MG tablet Generic drug: naproxen sodium   Aleve PM 220-25 MG Tabs Generic drug: Naproxen Sod-diphenhydrAMINE   Oxycodone HCl 10 MG Tabs       TAKE these medications    acetaminophen 325 MG tablet Commonly known as: TYLENOL Take 2 tablets (650 mg total) by mouth every 6 (six) hours as needed for mild pain (or Fever >/= 101).   Dexcom G6 Sensor Misc 1 Device by Other route as directed. Change sensors every 10 days What changed:  when to take this additional instructions   Dexcom G6 Transmitter Misc 1 Device by Does not apply route as directed.   HYDROmorphone 4 MG tablet Commonly known as: Dilaudid Take 1 tablet (4 mg total) by mouth every 4 (four) hours as needed for severe pain. What changed: when to take this   insulin aspart 100 UNIT/ML injection Commonly known as: novoLOG 60 units a day What changed:  how much to take how to take this when to take this additional instructions   Insulin Pen Needle 32G X 4 MM Misc 1 Device by Does not apply route daily in the afternoon.   Lantus SoloStar 100 UNIT/ML Solostar Pen Generic drug: insulin glargine  Inject 16 Units into the skin See admin instructions. Inject 16 units into the skin once a day as directed when Omnipod is not attached   lipase/protease/amylase 36000 UNITS Cpep capsule Commonly known as: Creon Take 2 capsules (72,000 Units total) by mouth 3 (three) times daily before meals. What changed:  how much to take when to take this additional instructions   multivitamin Tabs tablet Take 1 tablet by mouth daily with breakfast.   omeprazole 40 MG  capsule Commonly known as: PRILOSEC Take 1 capsule (40 mg total) by mouth daily. What changed: when to take this   Omnipod 5 G6 Pods (Gen 5) Misc 1 Device by Does not apply route every 3 (three) days.   ondansetron 8 MG tablet Commonly known as: Zofran Take 1 tablet (8 mg total) by mouth every 8 (eight) hours as needed for nausea or vomiting.        Follow-up Information     [name], Telford Nab., MD Follow up.   Specialties: Gastroenterology, Internal Medicine Why: Office will contac you with an appointment Contact information: [place] [address] [phone]         [name], MD. Schedule an appointment as soon as possible for a visit in 1 week(s).   Specialties: Family Medicine, Emergency Medicine Contact information: [place] [phone]                Discharge Exam: Danley Danker Weights   02/03/23 1211  Weight: 75 kg   Physical Exam Vitals reviewed.  Constitutional:      General: He is not in acute distress.    Appearance: He is not ill-appearing or toxic-appearing.  Cardiovascular:     Rate and Rhythm: Normal rate and regular rhythm.     Heart sounds: No murmur heard. Pulmonary:     Effort: Pulmonary effort is normal. No respiratory distress.     Breath sounds: No wheezing, rhonchi or rales.  Abdominal:     General: There is no distension.     Palpations: Abdomen is soft.     Tenderness: There is no abdominal tenderness.  Neurological:     Mental Status: He is alert.  Psychiatric:        Mood and Affect: Mood normal.        Behavior: Behavior normal.        Data Reviewed: CBG 207 this AM Na+ 133 LFTs unremarkable  Condition at discharge: good  The results of significant diagnostics from this hospitalization (including imaging, microbiology, ancillary and laboratory) are listed below for reference.   Imaging Studies: CT ABD PELVIS W/WO CM ONCOLOGY PANCREATIC PROTOCOL  Result Date:  02/03/2023 CLINICAL DATA:  Acute severe pancreatitis. Diabetes. 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 EXAM: CT ABDOMEN AND PELVIS WITHOUT AND WITH CONTRAST TECHNIQUE: Multidetector CT imaging of the abdomen and pelvis was performed following the standard protocol before and following the bolus administration of intravenous contrast. Unenhanced and arterial phase enhanced images of the abdomen are obtained followed by portal venous phase enhanced images of the abdomen and pelvis. RADIATION DOSE REDUCTION: This exam was performed according to the departmental dose-optimization program which includes automated exposure control, adjustment of the mA and/or kV according to patient size and/or use of iterative reconstruction technique. CONTRAST:  166mL OMNIPAQUE IOHEXOL 300 MG/ML  SOLN COMPARISON:  CT abdomen and pelvis 11/11/2022. MRI abdomen 11/26/2022 FINDINGS: Lower chest: Lung bases are clear.  Hepatobiliary: The gallbladder is contracted with small stones present. Mild pericholecystic edema. Mild intra and extrahepatic bile duct dilatation. No radiopaque common duct stones are identified. Pancreas: A pancreatic ductal stent is present. No pancreatic ductal dilatation. The head and body of the pancreas are enlarged and hypodense with hypoenhancement. Scattered parenchymal calcifications are present. Changes are consistent with acute on chronic pancreatitis. Hypoenhancing areas may indicate pancreatic necrosis. This represents progression since the previous study. Minimal stranding and edema around the head of the pancreas. Low-attenuation areas seen previously in the pancreatic tail is less prominent. Spleen: Normal in size without focal abnormality. Adrenals/Urinary Tract: Adrenal glands are unremarkable. Kidneys are normal, without renal calculi, focal lesion, or hydronephrosis. Bladder is unremarkable. Stomach/Bowel: Stomach, small bowel,  and colon are mostly decompressed with scattered stool in the colon. No wall thickening or inflammatory changes. Appendix is normal. 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 No discrete venous thrombus is identified. The abdominal aorta, celiac axis, superior mesenteric artery, bilateral renal arteries, and inferior mesenteric artery are patent without focal thrombus demonstrated. Reproductive: Prostate is unremarkable. Other: No free air or free fluid in the abdomen. Abdominal wall musculature appears intact. Musculoskeletal: No acute or significant osseous findings. IMPRESSION: 1. Enlarged and edematous appearance of the pancreas consistent with acute edematous pancreatitis. Mild edema and stranding around the pancreatic head. Hypoenhancement of the head and body of the pancreas may indicate pancreatic necrosis. No loculated collections are identified. 2. 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 Splenic and portal veins appear patent without evidence of significant thrombus. Patent abdominal aorta and visceral branch vessels. 3. Stent is in place in the pancreatic duct without pancreatic ductal dilatation. 4. Calcification in the pancreas consistent with underlying chronic pancreatitis. 5. Cholelithiasis with contracted gallbladder. Mild bile duct dilatation without cause identified, possibly reactive or occult common duct stone. Electronically Signed   By: Lucienne Capers M.D.   On: 02/03/2023 23:41   DG ERCP  Result Date: 02/02/2023 CLINICAL DATA:  [id number] Surgery, elective [id number] EXAM: ERCP TECHNIQUE: Multiple spot images obtained with the fluoroscopic device and submitted for interpretation post-procedure. COMPARISON:  ERCP 12/01/2022 FINDINGS: Series of fluoroscopic spot images document endoscopic cannulation  and removal of the previously placed pancreatic duct plastic pancreatic stent. Pullback balloon sweep of contrast injection through the pancreatic duct. Balloon angioplasty, and subsequently placement of 2 parallel pancreatic duct stents. IMPRESSION: 1. Pancreatic duct balloon angioplasty, and stent exchange. See procedure report for complete details. Electronically Signed   By: Lucrezia Europe M.D.   On: 02/02/2023 11:43   DG C-Arm 1-60 Min-No Report  Result Date: 02/02/2023 Fluoroscopy was utilized by the requesting physician.  No radiographic interpretation.    Microbiology: Results for orders placed or performed during the hospital encounter of 01/30/22  Resp Panel by RT-PCR (Flu A&B, Covid) Nasopharyngeal Swab     Status: None   Collection Time: 01/30/22  4:57 PM   Specimen: Nasopharyngeal Swab; Nasopharyngeal(NP) swabs in vial transport medium  Result Value Ref Range Status   SARS Coronavirus 2 by RT PCR NEGATIVE NEGATIVE Final    Comment: (NOTE) SARS-CoV-2 target nucleic acids are NOT DETECTED.  The SARS-CoV-2 RNA is generally detectable in upper respiratory specimens during the acute phase of infection. The lowest concentration of SARS-CoV-2 viral copies this assay can detect is 138 copies/mL. A negative result does not preclude SARS-Cov-2 infection and  should not be used as the sole basis for treatment or other patient management decisions. A negative result may occur with  improper specimen collection/handling, submission of specimen other than nasopharyngeal swab, presence of viral mutation(s) within the areas targeted by this assay, and inadequate number of viral copies(<138 copies/mL). A negative result must be combined with clinical observations, patient history, and epidemiological information. The expected result is Negative.  Fact Sheet for Patients:  EntrepreneurPulse.com.au  Fact Sheet for Healthcare Providers:   IncredibleEmployment.be  This test is no t yet approved or cleared by the [place] FDA and  has been authorized for detection and/or diagnosis of SARS-CoV-2 by FDA under an Emergency Use Authorization (EUA). This EUA will remain  in effect (meaning this test can be used) for the duration of the COVID-19 declaration under Section 564(b)(1) of the Act, 21 U.S.C.section 360bbb-3(b)(1), unless the authorization is terminated  or revoked sooner.       Influenza A by PCR NEGATIVE NEGATIVE Final   Influenza B by PCR NEGATIVE NEGATIVE Final    Comment: (NOTE) The Xpert Xpress SARS-CoV-2/FLU/RSV plus assay is intended as an aid in the diagnosis of influenza from Nasopharyngeal swab specimens and should not be used as a sole basis for treatment. Nasal washings and aspirates are unacceptable for Xpert Xpress SARS-CoV-2/FLU/RSV testing.  Fact Sheet for Patients: EntrepreneurPulse.com.au  Fact Sheet for Healthcare Providers: IncredibleEmployment.be  This test is not yet approved or cleared by the [place] FDA and has been authorized for detection and/or diagnosis of SARS-CoV-2 by FDA under an Emergency Use Authorization (EUA). This EUA will remain in effect (meaning this test can be used) for the duration of the COVID-19 declaration under Section 564(b)(1) of the Act, 21 U.S.C. section 360bbb-3(b)(1), unless the authorization is terminated or revoked.  Performed at [facility], [place] [address] [place], [place] [address]   Gastrointestinal Panel by PCR , Stool     Status: None   Collection Time: 01/30/22 10:12 PM   Specimen: Stool  Result Value Ref Range Status   Campylobacter species NOT DETECTED NOT DETECTED Final   Plesimonas shigelloides NOT DETECTED NOT DETECTED Final   Salmonella species NOT DETECTED NOT DETECTED Final   Yersinia enterocolitica NOT DETECTED NOT DETECTED Final   Vibrio species  NOT DETECTED NOT DETECTED Final   Vibrio cholerae NOT DETECTED NOT DETECTED Final   Enteroaggregative E coli (EAEC) NOT DETECTED NOT DETECTED Final   Enteropathogenic E coli (EPEC) NOT DETECTED NOT DETECTED Final   Enterotoxigenic E coli (ETEC) NOT DETECTED NOT DETECTED Final   Shiga like toxin producing E coli (STEC) NOT DETECTED NOT DETECTED Final   Shigella/Enteroinvasive E coli (EIEC) NOT DETECTED NOT DETECTED Final   Cryptosporidium NOT DETECTED NOT DETECTED Final   Cyclospora cayetanensis NOT DETECTED NOT DETECTED Final   Entamoeba histolytica NOT DETECTED NOT DETECTED Final   Giardia lamblia NOT DETECTED NOT DETECTED Final   Adenovirus F40/41 NOT DETECTED NOT DETECTED Final   Astrovirus NOT DETECTED NOT DETECTED Final   Norovirus GI/GII NOT DETECTED NOT DETECTED Final   Rotavirus A NOT DETECTED NOT DETECTED Final   Sapovirus (I, II, IV, and V) NOT DETECTED NOT DETECTED Final    Comment: Performed at [facility], [address] [place],  [address]    Labs: CBC: Recent Labs  Lab 02/03/23 1229 02/03/23 1646 02/04/23 0430 02/05/23 0455  WBC 4.8 4.4 3.9* 4.0  NEUTROABS 3.0  --   --   --  HGB 14.0 14.8 13.3 13.1  HCT 41.5 45.6 40.4 39.4  MCV 104.5* 107.8* 107.2* 104.2*  PLT 156 154 150 [id number]   Basic Metabolic Panel: Recent Labs  Lab 02/03/23 1229 02/03/23 1646 02/04/23 0430 02/05/23 0455  NA 133*  --  131* 133*  K 3.2*  --  4.1 3.5  CL 96*  --  99 101  CO2 26  --  23 23  GLUCOSE 192*  --  179* 197*  BUN 10  --  7 6  CREATININE 0.80 0.61 0.77 0.76  CALCIUM 8.6*  --  8.3* 8.2*   Liver Function Tests: Recent Labs  Lab 02/03/23 1229 02/04/23 0430 02/05/23 0455  AST 35 25 15  ALT 29 23 17   ALKPHOS 131* 117 102  BILITOT 0.7 0.7 0.8  PROT 7.2 6.3* 5.9*  ALBUMIN 3.8 3.2* 3.1*   CBG: Recent Labs  Lab 02/04/23 1201 02/04/23 1630 02/04/23 2122 02/05/23 0712 02/05/23 1231  GLUCAP 121* 199* 163* 207* 266*    Discharge time spent: greater  than 30 minutes.  Signed: Murray Hodgkins, MD [facility] 02/05/2023

## 2023-02-05 NOTE — Progress Notes (Signed)
Progress Note    ASSESSMENT AND PLAN:   Post ERCP pancreatitis Chronic (likely idiopathic) pancreatitis with PD stricture/stones s/p ERCP with PD stenting x 2 (3/27) Sequela of previous splenic vein thrombosis.  No active thrombosis  Plan: -Okay to D/C home -Will need pain meds x 1 week. -FU PCP -FU Gi as outpt (as outlined in ERCP report) -Resume Creon at home.   SUBJECTIVE   Doing much better. Tolerating low-fat diet without problems Did have good bowel movements. No fever or chills    OBJECTIVE:     Vital signs in last 24 hours: Temp:  [98 F (36.7 C)-98.4 F (36.9 C)] 98.4 F (36.9 C) (03/30 0641) Pulse Rate:  [63-72] 72 (03/30 0641) Resp:  [16-18] 16 (03/30 0641) BP: (136-142)/(90-98) 136/96 (03/30 0641) SpO2:  [99 %-100 %] 99 % (03/30 0641) Last BM Date : 02/04/23 General:   Alert, well-developed male in NAD EENT:  Normal hearing, non icteric sclera, conjunctive pink.  Heart:  Regular rate and rhythm; no murmur.  No lower extremity edema   Pulm: Normal respiratory effort, lungs CTA bilaterally without wheezes or crackles. Abdomen:  Soft, nondistended, -minimally tender better than yesterday, normal bowel sounds,.       Neurologic:  Alert and  oriented x4;  grossly normal neurologically. Psych:  Pleasant, cooperative.  Normal mood and affect.   Intake/Output from previous day: 03/29 0701 - 03/30 0700 In: 5499.2 [P.O.:240; I.V.:5259.2] Out: 900 [Urine:900] Intake/Output this shift: Total I/O In: 240 [P.O.:240] Out: 400 [Urine:400]  Lab Results: Recent Labs    02/03/23 1646 02/04/23 0430 02/05/23 0455  WBC 4.4 3.9* 4.0  HGB 14.8 13.3 13.1  HCT 45.6 40.4 39.4  PLT 154 150 154   BMET Recent Labs    02/03/23 1229 02/03/23 1646 02/04/23 0430 02/05/23 0455  NA 133*  --  131* 133*  K 3.2*  --  4.1 3.5  CL 96*  --  99 101  CO2 26  --  23 23  GLUCOSE 192*  --  179* 197*  BUN 10  --  7 6  CREATININE 0.80 0.61 0.77 0.76  CALCIUM 8.6*   --  8.3* 8.2*   LFT Recent Labs    02/05/23 0455  PROT 5.9*  ALBUMIN 3.1*  AST 15  ALT 17  ALKPHOS 102  BILITOT 0.8   PT/INR No results for input(s): "LABPROT", "INR" in the last 72 hours. Hepatitis Panel No results for input(s): "HEPBSAG", "HCVAB", "HEPAIGM", "HEPBIGM" in the last 72 hours.  CT ABD PELVIS W/WO CM ONCOLOGY PANCREATIC PROTOCOL  Result Date: 02/03/2023 CLINICAL DATA:  Acute severe pancreatitis. Diabetes. 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 EXAM: CT ABDOMEN AND PELVIS WITHOUT AND WITH CONTRAST TECHNIQUE: Multidetector CT imaging of the abdomen and pelvis was performed following the standard protocol before and following the bolus administration of intravenous contrast. Unenhanced and arterial phase enhanced images of the abdomen are obtained followed by portal venous phase enhanced images of the abdomen and pelvis. RADIATION DOSE REDUCTION: This exam was performed according to the departmental dose-optimization program which includes automated exposure control, adjustment of the mA and/or kV according to patient size and/or use of iterative reconstruction technique. CONTRAST:  196mL OMNIPAQUE IOHEXOL 300 MG/ML  SOLN COMPARISON:  CT abdomen and pelvis 11/11/2022. MRI abdomen 11/26/2022 FINDINGS: Lower chest: Lung bases are clear. Hepatobiliary: The gallbladder is contracted with small stones present. Mild pericholecystic edema. Mild intra and extrahepatic  bile duct dilatation. No radiopaque common duct stones are identified. Pancreas: A pancreatic ductal stent is present. No pancreatic ductal dilatation. The head and body of the pancreas are enlarged and hypodense with hypoenhancement. Scattered parenchymal calcifications are present. Changes are consistent with acute on chronic pancreatitis. Hypoenhancing areas may indicate pancreatic necrosis. This represents progression since the previous study.  Minimal stranding and edema around the head of the pancreas. Low-attenuation areas seen previously in the pancreatic tail is less prominent. Spleen: Normal in size without focal abnormality. Adrenals/Urinary Tract: Adrenal glands are unremarkable. Kidneys are normal, without renal calculi, focal lesion, or hydronephrosis. Bladder is unremarkable. Stomach/Bowel: Stomach, small bowel, and colon are mostly decompressed with scattered stool in the colon. No wall thickening or inflammatory changes. Appendix is normal. 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 No discrete venous thrombus is identified. The abdominal aorta, celiac axis, superior mesenteric artery, bilateral renal arteries, and inferior mesenteric artery are patent without focal thrombus demonstrated. Reproductive: Prostate is unremarkable. Other: No free air or free fluid in the abdomen. Abdominal wall musculature appears intact. Musculoskeletal: No acute or significant osseous findings. IMPRESSION: 1. Enlarged and edematous appearance of the pancreas consistent with acute edematous pancreatitis. Mild edema and stranding around the pancreatic head. Hypoenhancement of the head and body of the pancreas may indicate pancreatic necrosis. No loculated collections are identified. 2. 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 Splenic and portal veins appear patent without evidence of significant thrombus. Patent abdominal aorta and visceral branch vessels. 3. Stent is in place in the pancreatic duct without pancreatic ductal dilatation. 4. Calcification in the pancreas consistent with underlying chronic pancreatitis. 5. Cholelithiasis with contracted gallbladder. Mild bile duct dilatation without cause identified, possibly reactive or occult common duct stone.  Electronically Signed   By: Lucienne Capers M.D.   On: 02/03/2023 23:41     Active Problems:   Abdominal pain, chronic, epigastric   Acute on chronic pancreatitis ([place])   History of ERCP   Stricture of pancreatic duct   Acute pancreatitis   Diabetes mellitus type 1 ([place])     LOS: 2 days     Carmell Austria, MD 02/05/2023, 1:08 PM [facility] [phone]

## 2023-02-05 NOTE — Progress Notes (Signed)
Reviewed written d/c instructions w pt and all questions answered. He verbalized understanding. Pt is insisting that he needs another RF on his pain pills. Secure chat w MD and reviewed pt's last 30 days worth of Rx pain meds w pt in great detail, multiple questions answered, instructed pt to reach out to his PCP w any RF questions that he may still have. D/C ambulatory w all belongings in stable condition.

## 2023-02-05 NOTE — Plan of Care (Signed)

## 2023-02-07 ENCOUNTER — Telehealth: Payer: Self-pay

## 2023-02-07 ENCOUNTER — Other Ambulatory Visit: Payer: Self-pay | Admitting: Orthopedic Surgery

## 2023-02-07 DIAGNOSIS — G8929 Other chronic pain: Secondary | ICD-10-CM

## 2023-02-07 NOTE — Telephone Encounter (Signed)
Recall entered and follow up scheduled for 7/17 at 830 am with GM. Letter mailed

## 2023-02-07 NOTE — Transitions of Care (Post Inpatient/ED Visit) (Unsigned)
   02/07/2023  Name: Carlos [name] MRN: [id number] DOB: 1986-06-01  Today's TOC FU Call Status: Today's TOC FU Call Status:: Unsuccessul Call (1st Attempt) Unsuccessful Call (1st Attempt) Date: 02/07/23  Attempted to reach the patient regarding the most recent Inpatient/ED visit.  Follow Up Plan: Additional outreach attempts will be made to reach the patient to complete the Transitions of Care (Post Inpatient/ED visit) call.   Signature: Gennifer Potenza.D/RMA

## 2023-02-07 NOTE — Telephone Encounter (Signed)
-----   Message from Irving Copas., MD sent at 02/03/2023  5:04 PM EDT ----- Regarding: Follow-up ERCP Katarina Riebe, This patient needs a repeat ERCP in 2 to 3 months. Follow-up in clinic in 6 weeks with APP or myself.  Thanks. GM

## 2023-02-08 MED ORDER — HYDROMORPHONE HCL 4 MG PO TABS: 4.0000 mg | ORAL_TABLET | ORAL | 0 refills | Status: DC | PRN

## 2023-02-08 NOTE — Transitions of Care (Post Inpatient/ED Visit) (Signed)
   02/08/2023  Name: Carlos [name] MRN: [id number] DOB: 1985-12-30  Today's TOC FU Call Status: Today's TOC FU Call Status:: Unsuccessul Call (1st Attempt) Unsuccessful Call (1st Attempt) Date: 02/07/23  Transition Care Management Follow-up Telephone Call Date of Discharge: 02/05/23 Discharge Facility: [facility]) Type of Discharge: Inpatient Admission Primary Inpatient Discharge Diagnosis:: Postoperative pain  Items Reviewed:    Home Care and Equipment/Supplies:    Functional Questionnaire:    Follow up appointments reviewed: PCP Follow-up appointment confirmed?: Yes Date of PCP follow-up appointment?: 02/09/23 Follow-up Provider: Dr.Miller    SIGNATURE: Nazia Rhines.D/RMA

## 2023-02-09 ENCOUNTER — Encounter: Payer: Medicaid Other | Admitting: Family

## 2023-02-11 ENCOUNTER — Encounter: Payer: Medicaid Other | Admitting: Family

## 2023-02-13 ENCOUNTER — Other Ambulatory Visit: Payer: Self-pay | Admitting: Family Medicine

## 2023-02-13 DIAGNOSIS — G8929 Other chronic pain: Secondary | ICD-10-CM

## 2023-02-14 ENCOUNTER — Other Ambulatory Visit: Payer: Self-pay | Admitting: Family Medicine

## 2023-02-14 DIAGNOSIS — G8929 Other chronic pain: Secondary | ICD-10-CM

## 2023-02-15 ENCOUNTER — Ambulatory Visit (INDEPENDENT_AMBULATORY_CARE_PROVIDER_SITE_OTHER): Payer: Medicaid Other | Admitting: Family

## 2023-02-15 ENCOUNTER — Encounter: Payer: Self-pay | Admitting: Family

## 2023-02-15 VITALS — BP 124/82 | HR 98 | Temp 98.5°F | Resp 16 | Ht 77.0 in | Wt 164.2 lb

## 2023-02-15 DIAGNOSIS — E1065 Type 1 diabetes mellitus with hyperglycemia: Secondary | ICD-10-CM

## 2023-02-15 DIAGNOSIS — R1013 Epigastric pain: Secondary | ICD-10-CM | POA: Diagnosis not present

## 2023-02-15 DIAGNOSIS — G8929 Other chronic pain: Secondary | ICD-10-CM

## 2023-02-15 MED ORDER — HYDROMORPHONE HCL 4 MG PO TABS: 4.0000 mg | ORAL_TABLET | ORAL | 0 refills | Status: DC | PRN

## 2023-02-15 NOTE — Progress Notes (Signed)
Provider: Fe Okubo FNP-C  [name], MD  Patient Care Team: [name], MD as PCP - General (Family Medicine) Flo Shanks, MD as Consulting Physician (Otolaryngology)  Extended Emergency Contact Information Primary Emergency Contact: brathwaite,naketa Address: [address]          [place], [place] [address] [place] of [place] Home Phone: [phone] Work Phone: [phone] Mobile Phone: [phone] Relation: Significant other  Code Status:  Full Code  Goals of care: Advanced Directive information    02/03/2023   12:11 PM  Advanced Directives  Does Patient Have a Medical Advance Directive? No  Would patient like information on creating a medical advance directive? No - Patient declined     Chief Complaint  Patient presents with   Hospitalization Follow-up    In [place] from 3/28-3/30 for post op pain. He states that he is still having the pain it never stops. His Gi told him that he will always have pain.     HPI:  Pt is a 37 y.o. male seen today for an acute visit for follow up Hospitalization from 02/03/2023 - 02/05/2023 for ERCP on 02/02/2023 with Balloon dilation ,stone removal and stent replacement. He was prescribed oxycodone then to resume his dilaudid.  States stent placement did not really help with his Epigastric pain.Has follow up appointment with GI specialist.  Had Na+ 133,TP 5.9 and calcium 8.2 but declines lab work this visit states will be checked by GI.  CBG runs 150's -170's but couple 200's  Medication reconciled.   Past Medical History:  Diagnosis Date   AKI (acute kidney injury) 12/21/2021   Anxiety    Diabetes    Pancreatitis    Splenic vein thrombosis    Past Surgical History:  Procedure Laterality Date   BILIARY BRUSHING  02/02/2023   Procedure: BILIARY BRUSHING;  Surgeon: [name] [name], MD;  Location: [facility];  Service: Gastroenterology;;   BILIARY DILATION  12/01/2022   Procedure: BILIARY  DILATION;  Surgeon: Lemar Lofty., MD;  Location: [facility];  Service: Gastroenterology;;   BILIARY DILATION  02/02/2023   Procedure: BILIARY DILATION;  Surgeon: Lemar Lofty., MD;  Location: [facility];  Service: Gastroenterology;;   BIOPSY  12/02/2021   Procedure: BIOPSY;  Surgeon: Lemar Lofty., MD;  Location: [facility];  Service: Gastroenterology;;   ENDOSCOPIC RETROGRADE CHOLANGIOPANCREATOGRAPHY (ERCP) WITH PROPOFOL N/A 08/12/2022   Procedure: ENDOSCOPIC RETROGRADE CHOLANGIOPANCREATOGRAPHY (ERCP) WITH PROPOFOL;  Surgeon: Lemar Lofty., MD;  Location: [facility];  Service: Gastroenterology;  Laterality: N/A;   ENDOSCOPIC RETROGRADE CHOLANGIOPANCREATOGRAPHY (ERCP) WITH PROPOFOL N/A 12/01/2022   Procedure: ENDOSCOPIC RETROGRADE CHOLANGIOPANCREATOGRAPHY (ERCP) WITH PROPOFOL;  Surgeon: [name] [name], MD;  Location: [facility];  Service: Gastroenterology;  Laterality: N/A;   ENDOSCOPIC RETROGRADE CHOLANGIOPANCREATOGRAPHY (ERCP) WITH PROPOFOL N/A 02/02/2023   Procedure: ENDOSCOPIC RETROGRADE CHOLANGIOPANCREATOGRAPHY (ERCP) WITH PROPOFOL;  Surgeon: [name] [name], MD;  Location: [facility];  Service: Gastroenterology;  Laterality: N/A;   ESOPHAGOGASTRODUODENOSCOPY N/A 12/01/2022   Procedure: ESOPHAGOGASTRODUODENOSCOPY (EGD);  Surgeon: Lemar Lofty., MD;  Location: [facility];  Service: Gastroenterology;  Laterality: N/A;   ESOPHAGOGASTRODUODENOSCOPY (EGD) WITH PROPOFOL N/A 12/02/2021   Procedure: ESOPHAGOGASTRODUODENOSCOPY (EGD) WITH PROPOFOL;  Surgeon: [name] [name], MD;  Location: [facility];  Service: Gastroenterology;  Laterality: N/A;   FINE NEEDLE ASPIRATION N/A 12/01/2022   Procedure: FINE NEEDLE ASPIRATION (FNA) LINEAR;  Surgeon: Lemar Lofty., MD;  Location: [facility];  Service: Gastroenterology;  Laterality: N/A;   NEUROLYTIC CELIAC PLEXUS  12/01/2022   Procedure: CELIAC PLEXUS BLOCK;   Surgeon: Meridee ScoreMansouraty, Netty StarringGabriel Jr., MD;  [facility];  Service: Gastroenterology;;   NO PAST SURGERIES     PANCREATIC STENT PLACEMENT  08/12/2022   Procedure: PANCREATIC STENT PLACEMENT;  Surgeon: Lemar LoftyMansouraty, Gabriel Jr., MD;  [facility];  Service: Gastroenterology;;   PANCREATIC STENT PLACEMENT  12/01/2022   Procedure: PANCREATIC STENT PLACEMENT;  Surgeon: Lemar LoftyMansouraty, Gabriel Jr., MD;  [facility];  Service: Gastroenterology;;   PANCREATIC STENT PLACEMENT  02/02/2023   Procedure: PANCREATIC STENT PLACEMENT;  Surgeon: Lemar LoftyMansouraty, Gabriel Jr., MD;  [facility];  Service: Gastroenterology;;   REMOVAL OF STONES  12/01/2022   Procedure: REMOVAL OF STONES;  Surgeon: Lemar LoftyMansouraty, Gabriel Jr., MD;  [facility];  Service: Gastroenterology;;   REMOVAL OF STONES  02/02/2023   Procedure: REMOVAL OF STONES;  Surgeon: Lemar LoftyMansouraty, Gabriel Jr., MD;  [facility];  Service: Gastroenterology;;   Dennison MascotSPHINCTEROTOMY  08/12/2022   Procedure: Dennison MascotSPHINCTEROTOMY;  Surgeon: Lemar LoftyMansouraty, Gabriel Jr., MD;  [facility];  Service: Gastroenterology;;   Dennison MascotSPHINCTEROTOMY  12/01/2022   Procedure: Dennison MascotSPHINCTEROTOMY;  Surgeon: Lemar LoftyMansouraty, Gabriel Jr., MD;  [facility];  Service: Gastroenterology;;   Francine GravenSTENT REMOVAL  12/01/2022   Procedure: STENT REMOVAL;  Surgeon: Lemar LoftyMansouraty, Gabriel Jr., MD;  [facility];  Service: Gastroenterology;;   Francine GravenSTENT REMOVAL  02/02/2023   Procedure: STENT REMOVAL;  Surgeon: Lemar LoftyMansouraty, Gabriel Jr., MD;  [facility];  Service: Gastroenterology;;   UPPER ESOPHAGEAL ENDOSCOPIC ULTRASOUND (EUS) N/A 12/02/2021   Procedure: UPPER ESOPHAGEAL ENDOSCOPIC ULTRASOUND (EUS);  Surgeon: Lemar LoftyMansouraty, Gabriel Jr., MD;  [facility];  Service: Gastroenterology;  Laterality: N/A;   UPPER ESOPHAGEAL ENDOSCOPIC ULTRASOUND (EUS) N/A 12/01/2022   Procedure: UPPER ESOPHAGEAL ENDOSCOPIC ULTRASOUND (EUS);  Surgeon: Lemar LoftyMansouraty, Gabriel  Jr., MD;  [facility];  Service: Gastroenterology;  Laterality: N/A;    Allergies  Allergen Reactions   Other Anaphylaxis and Other (See Comments)    [name] - anaphylaxis    Outpatient Encounter Medications as of 02/15/2023  Medication Sig   acetaminophen (TYLENOL) 325 MG tablet Take 2 tablets (650 mg total) by mouth every 6 (six) hours as needed for mild pain (or Fever >/= 101).   Continuous Blood Gluc Sensor (DEXCOM G6 SENSOR) MISC 1 Device by Other route as directed. Change sensors every 10 days (Patient taking differently: 1 Device by Other route See admin instructions. Place a new sensor into the skin every 10 days)   Continuous Blood Gluc Transmit (DEXCOM G6 TRANSMITTER) MISC 1 Device by Does not apply route as directed.   HYDROmorphone (DILAUDID) 4 MG tablet Take 1 tablet (4 mg total) by mouth every 4 (four) hours as needed for severe pain.   insulin aspart (NOVOLOG) 100 UNIT/ML injection 60 units a day (Patient taking differently: Inject 60 Units into the skin See admin instructions. 60 units a day, per Omnipod (or as otherwise instructed); bolus is 5 units)   Insulin Disposable Pump (OMNIPOD 5 G6 POD, GEN 5,) MISC 1 Device by Does not apply route every 3 (three) days.   insulin glargine (LANTUS SOLOSTAR) 100 UNIT/ML Solostar Pen Inject 16 Units into the skin See admin instructions. Inject 16 units into the skin once a day as directed when Omnipod is not attached   Insulin Pen Needle 32G X 4 MM MISC 1 Device by Does not apply route daily in the afternoon.   lipase/protease/amylase (CREON) 36000 UNITS CPEP capsule Take 2 capsules (72,000 Units total) by mouth 3 (three) times  daily before meals. (Patient taking differently: Take 36,000-72,000 Units by mouth See admin instructions. Take 72,000 units by mouth three times a day before meals and 36,000 units with any snacks)   multivitamin (ONE-A-DAY MEN'S) TABS tablet Take 1 tablet by mouth daily with breakfast.   omeprazole  (PRILOSEC) 40 MG capsule Take 1 capsule (40 mg total) by mouth daily. (Patient taking differently: Take 40 mg by mouth daily before breakfast.)   ondansetron (ZOFRAN) 8 MG tablet Take 1 tablet (8 mg total) by mouth every 8 (eight) hours as needed for nausea or vomiting.   No facility-administered encounter medications on file as of 02/15/2023.    Review of Systems  Constitutional:  Negative for appetite change, chills, fatigue, fever and unexpected weight change.  HENT:  Negative for congestion, dental problem, ear discharge, ear pain, facial swelling, hearing loss, nosebleeds, postnasal drip, rhinorrhea, sinus pressure, sinus pain, sneezing, sore throat, tinnitus and trouble swallowing.   Eyes:  Negative for pain, discharge, redness, itching and visual disturbance.  Respiratory:  Negative for cough, chest tightness, shortness of breath and wheezing.   Cardiovascular:  Negative for chest pain, palpitations and leg swelling.  Gastrointestinal:  Positive for abdominal pain. Negative for abdominal distention, blood in stool, constipation, diarrhea, nausea and vomiting.  Endocrine: Negative for cold intolerance, heat intolerance, polydipsia, polyphagia and polyuria.  Genitourinary:  Negative for difficulty urinating, dysuria, flank pain, frequency and urgency.  Musculoskeletal:  Negative for arthralgias, back pain, gait problem, joint swelling, myalgias, neck pain and neck stiffness.  Skin:  Negative for color change, pallor, rash and wound.  Neurological:  Negative for dizziness, syncope, speech difficulty, weakness, light-headedness, numbness and headaches.  Hematological:  Does not bruise/bleed easily.  Psychiatric/Behavioral:  Negative for agitation, behavioral problems, confusion, hallucinations, self-injury, sleep disturbance and suicidal ideas. The patient is not nervous/anxious.     Immunization History  Administered Date(s) Administered   Influenza,inj,Quad PF,6+ Mos 09/15/2021    Influenza-Unspecified 08/09/2022   Tdap 10/08/2022   Pertinent  Health Maintenance Due  Topic Date Due   OPHTHALMOLOGY EXAM  08/17/2023 (Originally 11/15/1995)   HEMOGLOBIN A1C  05/05/2023   INFLUENZA VACCINE  06/09/2023   FOOT EXAM  11/04/2023      11/11/2022   12:03 PM 11/11/2022    6:15 PM 11/11/2022    9:20 PM 11/12/2022    7:01 AM 01/31/2023    2:25 PM  Fall Risk  Falls in the past year?     0  Was there an injury with Fall?     0  Fall Risk Category Calculator     0  (RETIRED) Patient Fall Risk Level Low fall risk Low fall risk Low fall risk Low fall risk   Patient at Risk for Falls Due to     No Fall Risks  Fall risk Follow up     Falls evaluation completed   Functional Status Survey:    Vitals:   02/15/23 1036  BP: 124/82  Pulse: 98  Resp: 16  Temp: 98.5 F (36.9 C)  TempSrc: Temporal  SpO2: 98%  Weight: 164 lb 3.2 oz (74.5 kg)  Height: 6\' 5"  (1.956 m)   Body mass index is 19.47 kg/m. Physical Exam Vitals reviewed.  Constitutional:      General: He is not in acute distress.    Appearance: Normal appearance. He is normal weight. He is not ill-appearing or diaphoretic.  HENT:     Head: Normocephalic.  Eyes:     General: No scleral icterus.  Right eye: No discharge.        Left eye: No discharge.  Neck:     Vascular: No carotid bruit.  Cardiovascular:     Rate and Rhythm: Normal rate and regular rhythm.     Pulses: Normal pulses.     Heart sounds: Normal heart sounds. No murmur heard.    No friction rub. No gallop.  Pulmonary:     Effort: Pulmonary effort is normal. No respiratory distress.     Breath sounds: Normal breath sounds. No wheezing, rhonchi or rales.  Chest:     Chest wall: No tenderness.  Abdominal:     General: Bowel sounds are normal. There is no distension.     Palpations: Abdomen is soft. There is no mass.     Tenderness: There is abdominal tenderness in the right upper quadrant, epigastric area and left upper quadrant. There is no  right CVA tenderness, left CVA tenderness, guarding or rebound.  Musculoskeletal:        General: No swelling or tenderness. Normal range of motion.     Cervical back: Normal range of motion. No rigidity or tenderness.     Right lower leg: No edema.     Left lower leg: No edema.  Lymphadenopathy:     Cervical: No cervical adenopathy.  Skin:    General: Skin is warm and dry.     Coloration: Skin is not pale.     Findings: No bruising, erythema, lesion or rash.  Neurological:     Mental Status: He is alert and oriented to person, place, and time.     Cranial Nerves: No cranial nerve deficit.     Sensory: No sensory deficit.     Motor: No weakness.     Coordination: Coordination normal.     Gait: Gait normal.  Psychiatric:        Mood and Affect: Mood normal.        Speech: Speech normal.        Behavior: Behavior normal.    Labs reviewed: Recent Labs    07/22/22 0541 08/02/22 0914 08/15/22 0328 08/16/22 0316 08/18/22 0318 10/12/22 1631 02/03/23 1229 02/03/23 1646 02/04/23 0430 02/05/23 0455  NA 138   < > 135 136 136   < > 133*  --  131* 133*  K 4.4   < > 3.5 3.7 4.1   < > 3.2*  --  4.1 3.5  CL 99   < > 102 102 102   < > 96*  --  99 101  CO2 30   < > 24 27 28    < > 26  --  23 23  GLUCOSE 148*   < > 188* 252* 204*   < > 192*  --  179* 197*  BUN <5*   < > 8 6 8    < > 10  --  7 6  CREATININE 0.93   < > 0.70 0.82 0.80   < > 0.80 0.61 0.77 0.76  CALCIUM 9.9   < > 8.4* 8.5* 8.9   < > 8.6*  --  8.3* 8.2*  MG 1.9  --  1.7 1.7 1.7  --   --   --   --   --   PHOS 4.0  --   --   --   --   --   --   --   --   --    < > = values in this interval not displayed.  Recent Labs    02/03/23 1229 02/04/23 0430 02/05/23 0455  AST 35 25 15  ALT 29 23 17   ALKPHOS 131* 117 102  BILITOT 0.7 0.7 0.8  PROT 7.2 6.3* 5.9*  ALBUMIN 3.8 3.2* 3.1*   Recent Labs    08/13/22 1934 08/15/22 0328 11/10/22 2303 11/12/22 0443 02/03/23 1229 02/03/23 1646 02/04/23 0430 02/05/23 0455  WBC  6.3   < > 5.7   < > 4.8 4.4 3.9* 4.0  NEUTROABS 4.6  --  3.6  --  3.0  --   --   --   HGB 14.1   < > 14.8   < > 14.0 14.8 13.3 13.1  HCT 42.2   < > 44.3   < > 41.5 45.6 40.4 39.4  MCV 98.6   < > 104.2*   < > 104.5* 107.8* 107.2* 104.2*  PLT 187   < > 222   < > 156 154 150 154   < > = values in this interval not displayed.   Lab Results  Component Value Date   TSH 0.81 03/10/2022   Lab Results  Component Value Date   HGBA1C 8.4 (H) 11/03/2022   Lab Results  Component Value Date   CHOL 104 03/10/2022   HDL 56.50 03/10/2022   LDLCALC 18 03/10/2022   TRIG 153 (H) 07/20/2022   CHOLHDL 2 03/10/2022    Significant Diagnostic Results in last 30 days:  CT ABD PELVIS W/WO CM ONCOLOGY PANCREATIC PROTOCOL  Result Date: 02/03/2023 CLINICAL DATA:  Acute severe pancreatitis. Diabetes. 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 EXAM: CT ABDOMEN AND PELVIS WITHOUT AND WITH CONTRAST TECHNIQUE: Multidetector CT imaging of the abdomen and pelvis was performed following the standard protocol before and following the bolus administration of intravenous contrast. Unenhanced and arterial phase enhanced images of the abdomen are obtained followed by portal venous phase enhanced images of the abdomen and pelvis. RADIATION DOSE REDUCTION: This exam was performed according to the departmental dose-optimization program which includes automated exposure control, adjustment of the mA and/or kV according to patient size and/or use of iterative reconstruction technique. CONTRAST:  100mL OMNIPAQUE IOHEXOL 300 MG/ML  SOLN COMPARISON:  CT abdomen and pelvis 11/11/2022. MRI abdomen 11/26/2022 FINDINGS: Lower chest: Lung bases are clear. Hepatobiliary: The gallbladder is contracted with small stones present. Mild pericholecystic edema. Mild intra and extrahepatic bile duct dilatation. No radiopaque common duct stones are identified. Pancreas: A  pancreatic ductal stent is present. No pancreatic ductal dilatation. The head and body of the pancreas are enlarged and hypodense with hypoenhancement. Scattered parenchymal calcifications are present. Changes are consistent with acute on chronic pancreatitis. Hypoenhancing areas may indicate pancreatic necrosis. This represents progression since the previous study. Minimal stranding and edema around the head of the pancreas. Low-attenuation areas seen previously in the pancreatic tail is less prominent. Spleen: Normal in size without focal abnormality. Adrenals/Urinary Tract: Adrenal glands are unremarkable. Kidneys are normal, without renal calculi, focal lesion, or hydronephrosis. Bladder is unremarkable. Stomach/Bowel: Stomach, small bowel, and colon are mostly decompressed with scattered stool in the colon. No wall thickening or inflammatory changes. Appendix is normal. 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 No discrete venous thrombus is identified. The abdominal aorta, celiac axis, superior mesenteric artery, bilateral renal arteries, and inferior mesenteric artery are patent without focal thrombus demonstrated. Reproductive: Prostate is unremarkable. Other: No  free air or free fluid in the abdomen. Abdominal wall musculature appears intact. Musculoskeletal: No acute or significant osseous findings. IMPRESSION: 1. Enlarged and edematous appearance of the pancreas consistent with acute edematous pancreatitis. Mild edema and stranding around the pancreatic head. Hypoenhancement of the head and body of the pancreas may indicate pancreatic necrosis. No loculated collections are identified. 2. 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 Splenic and portal veins appear patent without evidence of significant thrombus.  Patent abdominal aorta and visceral branch vessels. 3. Stent is in place in the pancreatic duct without pancreatic ductal dilatation. 4. Calcification in the pancreas consistent with underlying chronic pancreatitis. 5. Cholelithiasis with contracted gallbladder. Mild bile duct dilatation without cause identified, possibly reactive or occult common duct stone. Electronically Signed   By: Burman Nieves M.D.   On: 02/03/2023 23:41   DG ERCP  Result Date: 02/02/2023 CLINICAL DATA:  [id number] Surgery, elective [id number] EXAM: ERCP TECHNIQUE: Multiple spot images obtained with the fluoroscopic device and submitted for interpretation post-procedure. COMPARISON:  ERCP 12/01/2022 FINDINGS: Series of fluoroscopic spot images document endoscopic cannulation and removal of the previously placed pancreatic duct plastic pancreatic stent. Pullback balloon sweep of contrast injection through the pancreatic duct. Balloon angioplasty, and subsequently placement of 2 parallel pancreatic duct stents. IMPRESSION: 1. Pancreatic duct balloon angioplasty, and stent exchange. See procedure report for complete details. Electronically Signed   By: Corlis Leak M.D.   On: 02/02/2023 11:43   DG C-Arm 1-60 Min-No Report  Result Date: 02/02/2023 Fluoroscopy was utilized by the requesting physician.  No radiographic interpretation.    Assessment/Plan  1. Abdominal pain, chronic, epigastric Chronic  S/p post hospitalization from 02/03/2023 - 02/05/2023 for ERCP on 02/02/2023 with Balloon dilation ,stone removal and stent replacement.  - pain persist has upcoming f/u appt with GI specialist.   2. Type 1 diabetes mellitus with hyperglycemia Lab Results  Component Value Date   HGBA1C 8.4 (H) 11/03/2022  - continue on Lantus and Novolog   Family/ staff Communication: Reviewed plan of care with patient verbalized understanding   Labs/tests ordered: None   Next Appointment: Return if symptoms worsen or fail to improve.   Caesar Bookman, NP

## 2023-02-17 ENCOUNTER — Other Ambulatory Visit: Payer: Self-pay | Admitting: Family Medicine

## 2023-02-17 DIAGNOSIS — G8929 Other chronic pain: Secondary | ICD-10-CM

## 2023-02-17 NOTE — Telephone Encounter (Signed)
Patient is requested refill. Stated that he needs a 30 day supply not 4 day supply  Patient is taking every 4 hours.  Refilled for #30 on 02/15/2023 by Dr. Lauretta Grill and sent to [facility] for approval due to Dr. Hyacinth Meeker out of office.  Please Advise.

## 2023-02-18 ENCOUNTER — Other Ambulatory Visit: Payer: Self-pay | Admitting: Family Medicine

## 2023-02-18 DIAGNOSIS — G8929 Other chronic pain: Secondary | ICD-10-CM

## 2023-02-18 NOTE — Telephone Encounter (Signed)
Patient is requesting a refill of the following medications: Requested Prescriptions   Pending Prescriptions Disp Refills   HYDROmorphone (DILAUDID) 4 MG tablet 30 tablet 0    Sig: Take 1 tablet (4 mg total) by mouth every 4 (four) hours as needed for severe pain.    Date of last refill: 02/15/23  Refill amount: 30  Treatment agreement date: 01/05/23

## 2023-02-21 NOTE — Telephone Encounter (Signed)
Anita,has this be addressed I was out of the office sick on 02/17/2023 and 02/18/2023.

## 2023-02-21 NOTE — Telephone Encounter (Signed)
Left message on voicemail for patient to return call when available, patient to speak with clinical intake assistant.

## 2023-02-22 ENCOUNTER — Other Ambulatory Visit (HOSPITAL_COMMUNITY): Payer: Self-pay

## 2023-02-22 MED ORDER — HYDROMORPHONE HCL 4 MG PO TABS
4.0000 mg | ORAL_TABLET | ORAL | 0 refills | Status: DC | PRN
  Filled 2023-02-22: qty 30, 5d supply, fill #0

## 2023-02-28 ENCOUNTER — Other Ambulatory Visit: Payer: Self-pay | Admitting: Family Medicine

## 2023-02-28 DIAGNOSIS — G8929 Other chronic pain: Secondary | ICD-10-CM

## 2023-03-01 ENCOUNTER — Other Ambulatory Visit (HOSPITAL_COMMUNITY): Payer: Self-pay

## 2023-03-01 MED ORDER — HYDROMORPHONE HCL 4 MG PO TABS
4.0000 mg | ORAL_TABLET | ORAL | 0 refills | Status: DC | PRN
  Filled 2023-03-01: qty 30, 5d supply, fill #0

## 2023-03-07 ENCOUNTER — Other Ambulatory Visit: Payer: Self-pay | Admitting: Family Medicine

## 2023-03-07 DIAGNOSIS — G8929 Other chronic pain: Secondary | ICD-10-CM

## 2023-03-07 NOTE — Telephone Encounter (Signed)
Patient is requesting a refill of the following medications: Requested Prescriptions   Pending Prescriptions Disp Refills   HYDROmorphone (DILAUDID) 4 MG tablet 30 tablet 0    Sig: Take 1 tablet (4 mg total) by mouth every 4 (four) hours as needed for severe pain.    Date of last refill: 03/01/2023  Refill amount: 30  Treatment agreement date: 01/05/2023  Dr.Miller is out of office on Monday's, I will send to the covering provider: Abbey Chatters, NP

## 2023-03-08 ENCOUNTER — Other Ambulatory Visit (HOSPITAL_COMMUNITY): Payer: Self-pay

## 2023-03-08 MED ORDER — HYDROMORPHONE HCL 4 MG PO TABS
4.0000 mg | ORAL_TABLET | ORAL | 0 refills | Status: DC | PRN
  Filled 2023-03-08: qty 30, 5d supply, fill #0

## 2023-03-14 ENCOUNTER — Other Ambulatory Visit: Payer: Self-pay | Admitting: Family Medicine

## 2023-03-14 DIAGNOSIS — G8929 Other chronic pain: Secondary | ICD-10-CM

## 2023-03-15 ENCOUNTER — Other Ambulatory Visit (HOSPITAL_COMMUNITY): Payer: Self-pay

## 2023-03-15 MED ORDER — HYDROMORPHONE HCL 4 MG PO TABS
4.0000 mg | ORAL_TABLET | ORAL | 0 refills | Status: DC | PRN
  Filled 2023-03-15: qty 30, 5d supply, fill #0

## 2023-03-16 ENCOUNTER — Ambulatory Visit: Payer: Medicaid Other | Admitting: Family Medicine

## 2023-03-21 ENCOUNTER — Other Ambulatory Visit: Payer: Self-pay | Admitting: Family Medicine

## 2023-03-21 DIAGNOSIS — G8929 Other chronic pain: Secondary | ICD-10-CM

## 2023-03-22 ENCOUNTER — Other Ambulatory Visit (HOSPITAL_COMMUNITY): Payer: Self-pay

## 2023-03-22 MED ORDER — HYDROMORPHONE HCL 4 MG PO TABS
4.0000 mg | ORAL_TABLET | ORAL | 0 refills | Status: DC | PRN
  Filled 2023-03-22: qty 30, 5d supply, fill #0

## 2023-03-28 ENCOUNTER — Other Ambulatory Visit: Payer: Self-pay | Admitting: Family Medicine

## 2023-03-28 DIAGNOSIS — G8929 Other chronic pain: Secondary | ICD-10-CM

## 2023-03-29 ENCOUNTER — Other Ambulatory Visit (HOSPITAL_COMMUNITY): Payer: Self-pay

## 2023-03-29 MED ORDER — HYDROMORPHONE HCL 4 MG PO TABS
4.0000 mg | ORAL_TABLET | ORAL | 0 refills | Status: DC | PRN
  Filled 2023-03-29: qty 30, 5d supply, fill #0

## 2023-03-30 ENCOUNTER — Ambulatory Visit: Payer: Medicaid Other | Admitting: Family Medicine

## 2023-03-31 ENCOUNTER — Other Ambulatory Visit: Payer: Self-pay

## 2023-03-31 DIAGNOSIS — K8689 Other specified diseases of pancreas: Secondary | ICD-10-CM

## 2023-03-31 NOTE — Telephone Encounter (Signed)
ERCP has been scheduled for 06/02/23 at 10 am at [facility] with GM   Left message on machine to call back

## 2023-04-01 ENCOUNTER — Ambulatory Visit: Payer: Medicaid Other | Admitting: Internal Medicine

## 2023-04-01 NOTE — Progress Notes (Deleted)
Name: Carlos [name]  MRN/ DOB: [id number], 07-20-1986   Age/ Sex: 37 y.o., male    PCP: [name], MD   Reason for Endocrinology Evaluation: Type 1 Diabetes Mellitus     Date of Initial Endocrinology Visit: 03/10/2022    PATIENT IDENTIFIER: Carlos [name] is a 37 y.o. male with a past medical history of T1DM, Hx of chronic pancreatitis due to ETOH abuse. The patient presented for initial endocrinology clinic visit on 03/10/2022 for consultative assistance with his diabetes management.    HPI: Carlos [name] was    Diagnosed with DM 09/2021 Hemoglobin A1c has ranged from 9.2% in 2023, peaking at 10.1% in 2022.   He is a Estate agent  at [employer] , works 3rd shift   On his initial visit to our clinic he had an A1c of 6.9%, we adjusted MDI regimen and provided him with a correction factor to be used before each meal   He was trained on Dexcom/OmniPod 05/2022  SUBJECTIVE:   During the last visit (11/17/2022): A1c 8.4%  Today (04/01/23): Carlos [name] is here for a follow up on diabetes management. He  checks his blood sugars 4 times daily. The patient has not had hypoglycemic episodes since the last clinic visit.  Since his last visit here he has been admitted multiple times for pancreatitis, complicated by PD stricture, pseudocyst, splenic vein thrombosis ,s/p EGD and ERCP He has been without the insulin pump for 3 months as well and CGM.  He has only been using prandial insulin with each meal but no basal insulin hence worsening glycemic control   Pump and meter download:    Pump   OmniPod Settings   Insulin type   novolog   Basal rate       0000 0.5 u/h               I:C ratio       0000 1:1 Enter #6TID                  Sensitivity       0000  50      Goal       0000  110             Type & Model of Pump: OmniPod Insulin Type: Currently using novolog.  There is no height or weight on file to calculate BMI.  PUMP STATISTICS: Not being  used     HOME DIABETES REGIMEN: Lantus 16 units daily - not taking  Novolog 5 units TIDQAC CF: Novolog (BG-130/50)   Statin: no ACE-I/ARB: no Prior Diabetic Education: yes    CONTINUOUS GLUCOSE MONITORING RECORD INTERPRETATION: N/A       DIABETIC COMPLICATIONS: Microvascular complications:   Denies: CKD, neuropathy, retinopathy Last eye exam: Completed 05/2022  Macrovascular complications:   Denies: CAD, PVD, CVA   PAST HISTORY: Past Medical History:  Past Medical History:  Diagnosis Date   AKI (acute kidney injury) (HCC) 12/21/2021   Anxiety    Diabetes (HCC)    Pancreatitis    Splenic vein thrombosis    Past Surgical History:  Past Surgical History:  Procedure Laterality Date   BILIARY BRUSHING  02/02/2023   Procedure: BILIARY BRUSHING;  Surgeon: Lemar Lofty., MD;  Location: [facility];  Service: Gastroenterology;;   BILIARY DILATION  12/01/2022   Procedure: BILIARY DILATION;  Surgeon: Lemar Lofty., MD;  Location: [facility];  Service: Gastroenterology;;   BILIARY DILATION  02/02/2023   Procedure:  BILIARY DILATION;  Surgeon: [name] [name], MD;  Location: [facility];  Service: Gastroenterology;;   BIOPSY  12/02/2021   Procedure: BIOPSY;  Surgeon: Lemar Lofty., MD;  Location: [facility];  Service: Gastroenterology;;   ENDOSCOPIC RETROGRADE CHOLANGIOPANCREATOGRAPHY (ERCP) WITH PROPOFOL N/A 08/12/2022   Procedure: ENDOSCOPIC RETROGRADE CHOLANGIOPANCREATOGRAPHY (ERCP) WITH PROPOFOL;  Surgeon: Lemar Lofty., MD;  Location: [facility];  Service: Gastroenterology;  Laterality: N/A;   ENDOSCOPIC RETROGRADE CHOLANGIOPANCREATOGRAPHY (ERCP) WITH PROPOFOL N/A 12/01/2022   Procedure: ENDOSCOPIC RETROGRADE CHOLANGIOPANCREATOGRAPHY (ERCP) WITH PROPOFOL;  Surgeon: [name] [name], MD;  Location: [facility];  Service: Gastroenterology;  Laterality: N/A;   ENDOSCOPIC RETROGRADE CHOLANGIOPANCREATOGRAPHY (ERCP)  WITH PROPOFOL N/A 02/02/2023   Procedure: ENDOSCOPIC RETROGRADE CHOLANGIOPANCREATOGRAPHY (ERCP) WITH PROPOFOL;  Surgeon: [name] [name], MD;  Location: [facility];  Service: Gastroenterology;  Laterality: N/A;   ESOPHAGOGASTRODUODENOSCOPY N/A 12/01/2022   Procedure: ESOPHAGOGASTRODUODENOSCOPY (EGD);  Surgeon: Lemar Lofty., MD;  Location: [facility];  Service: Gastroenterology;  Laterality: N/A;   ESOPHAGOGASTRODUODENOSCOPY (EGD) WITH PROPOFOL N/A 12/02/2021   Procedure: ESOPHAGOGASTRODUODENOSCOPY (EGD) WITH PROPOFOL;  Surgeon: [name] [name], MD;  Location: [facility];  Service: Gastroenterology;  Laterality: N/A;   FINE NEEDLE ASPIRATION N/A 12/01/2022   Procedure: FINE NEEDLE ASPIRATION (FNA) LINEAR;  Surgeon: Lemar Lofty., MD;  Location: [facility];  Service: Gastroenterology;  Laterality: N/A;   NEUROLYTIC CELIAC PLEXUS  12/01/2022   Procedure: CELIAC PLEXUS BLOCK;  Surgeon: [name], [name], MD;  Location: [facility];  Service: Gastroenterology;;   NO PAST SURGERIES     PANCREATIC STENT PLACEMENT  08/12/2022   Procedure: PANCREATIC STENT PLACEMENT;  Surgeon: Lemar Lofty., MD;  Location: [facility];  Service: Gastroenterology;;   PANCREATIC STENT PLACEMENT  12/01/2022   Procedure: PANCREATIC STENT PLACEMENT;  Surgeon: Lemar Lofty., MD;  Location: [facility];  Service: Gastroenterology;;   PANCREATIC STENT PLACEMENT  02/02/2023   Procedure: PANCREATIC STENT PLACEMENT;  Surgeon: Lemar Lofty., MD;  Location: [facility];  Service: Gastroenterology;;   REMOVAL OF STONES  12/01/2022   Procedure: REMOVAL OF STONES;  Surgeon: Lemar Lofty., MD;  Location: [facility];  Service: Gastroenterology;;   REMOVAL OF STONES  02/02/2023   Procedure: REMOVAL OF STONES;  Surgeon: Lemar Lofty., MD;  Location: [facility];  Service: Gastroenterology;;   [name]  08/12/2022   Procedure:  [name];  Surgeon: Lemar Lofty., MD;  Location: [facility];  Service: Gastroenterology;;   [name]  12/01/2022   Procedure: [name];  Surgeon: Lemar Lofty., MD;  Location: [facility];  Service: Gastroenterology;;   Francine Graven REMOVAL  12/01/2022   Procedure: STENT REMOVAL;  Surgeon: Lemar Lofty., MD;  Location: [facility];  Service: Gastroenterology;;   Francine Graven REMOVAL  02/02/2023   Procedure: STENT REMOVAL;  Surgeon: Lemar Lofty., MD;  Location: [facility];  Service: Gastroenterology;;   UPPER ESOPHAGEAL ENDOSCOPIC ULTRASOUND (EUS) N/A 12/02/2021   Procedure: UPPER ESOPHAGEAL ENDOSCOPIC ULTRASOUND (EUS);  Surgeon: Lemar Lofty., MD;  Location: [facility];  Service: Gastroenterology;  Laterality: N/A;   UPPER ESOPHAGEAL ENDOSCOPIC ULTRASOUND (EUS) N/A 12/01/2022   Procedure: UPPER ESOPHAGEAL ENDOSCOPIC ULTRASOUND (EUS);  Surgeon: Lemar Lofty., MD;  Location: [facility];  Service: Gastroenterology;  Laterality: N/A;    Social History:  reports that he has been smoking cigars. He started smoking about 20 years ago. He has never used smokeless tobacco. He reports that he does not currently use alcohol. He reports that he does not use drugs. Family History:  Family  History  Problem Relation Age of Onset   Hypertension Mother    Alcoholism Maternal Uncle    Diabetes Maternal Grandmother    Diabetes Maternal Grandfather    Diabetes Paternal Grandmother    Diabetes Paternal Grandfather    Colon cancer Neg Hx    Esophageal cancer Neg Hx    Inflammatory bowel disease Neg Hx    Liver disease Neg Hx    Pancreatic cancer Neg Hx    Rectal cancer Neg Hx    Stomach cancer Neg Hx      HOME MEDICATIONS: Allergies as of 04/01/2023       Reactions   Other Anaphylaxis, Other (See Comments)   [name] - anaphylaxis        Medication List        Accurate as of Apr 01, 2023  6:53 AM. If you have any  questions, ask your nurse or doctor.          acetaminophen 325 MG tablet Commonly known as: TYLENOL Take 2 tablets (650 mg total) by mouth every 6 (six) hours as needed for mild pain (or Fever >/= 101).   Dexcom G6 Sensor Misc 1 Device by Other route as directed. Change sensors every 10 days What changed:  when to take this additional instructions   Dexcom G6 Transmitter Misc 1 Device by Does not apply route as directed.   HYDROmorphone 4 MG tablet Commonly known as: Dilaudid Take 1 tablet (4 mg total) by mouth every 4 (four) hours as needed for severe pain.   insulin aspart 100 UNIT/ML injection Commonly known as: novoLOG 60 units a day What changed:  how much to take how to take this when to take this additional instructions   Insulin Pen Needle 32G X 4 MM Misc 1 Device by Does not apply route daily in the afternoon.   Lantus SoloStar 100 UNIT/ML Solostar Pen Generic drug: insulin glargine Inject 16 Units into the skin See admin instructions. Inject 16 units into the skin once a day as directed when Omnipod is not attached   lipase/protease/amylase [address] UNITS Cpep capsule Commonly known as: Creon Take 2 capsules (72,000 Units total) by mouth 3 (three) times daily before meals. What changed:  how much to take when to take this additional instructions   multivitamin Tabs tablet Take 1 tablet by mouth daily with breakfast.   omeprazole 40 MG capsule Commonly known as: PRILOSEC Take 1 capsule (40 mg total) by mouth daily. What changed: when to take this   Omnipod 5 G6 Pods (Gen 5) Misc 1 Device by Does not apply route every 3 (three) days.   ondansetron 8 MG tablet Commonly known as: Zofran Take 1 tablet (8 mg total) by mouth every 8 (eight) hours as needed for nausea or vomiting.         ALLERGIES: Allergies  Allergen Reactions   Other Anaphylaxis and Other (See Comments)    [name] - anaphylaxis     REVIEW OF SYSTEMS: A comprehensive  ROS was conducted with the patient and is negative except as per HPI and below:      OBJECTIVE:   VITAL SIGNS: There were no vitals taken for this visit.   PHYSICAL EXAM:  General: Pt appears well and is in NAD  Lungs: Clear with good BS bilat   Heart: RRR   Abdomen: Non tender with guarding   Extremities:  Lower extremities - No pretibial edema.   Neuro: MS is good with appropriate affect, pt is  alert and Ox3    DM foot exam: 03/10/2022  The skin of the feet is intact without sores or ulcerations. The pedal pulses are 2+ on right and 2+ on left. The sensation is intact to a screening 5.07, 10 gram monofilament bilaterally   DATA REVIEWED:  Lab Results  Component Value Date   HGBA1C 8.4 (H) 11/03/2022   HGBA1C 7.0 (H) 07/20/2022   HGBA1C 6.9 (A) 03/10/2022     Latest Reference Range & Units 02/05/23 04:55  COMPREHENSIVE METABOLIC PANEL  Rpt !  Sodium 135 - 145 mmol/L 133 (L)  Potassium 3.5 - 5.1 mmol/L 3.5  Chloride 98 - 111 mmol/L 101  CO2 22 - 32 mmol/L 23  Glucose 70 - 99 mg/dL 161 (H)  BUN 6 - 20 mg/dL 6  Creatinine 0.96 - 0.45 mg/dL 4.09  Calcium 8.9 - 81.1 mg/dL 8.2 (L)  Anion gap 5 - 15  9  Alkaline Phosphatase 38 - 126 U/L 102  Albumin 3.5 - 5.0 g/dL 3.1 (L)  Lipase 11 - 51 U/L 26  AST 15 - 41 U/L 15  ALT 0 - 44 U/L 17  Total Protein 6.5 - 8.1 g/dL 5.9 (L)  Total Bilirubin 0.3 - 1.2 mg/dL 0.8  GFR, Estimated >91 mL/min >60  !: Data is abnormal (L): Data is abnormally low (H): Data is abnormally high Rpt: View report in Results Review for more information   In office BG 256 mg/DL  ASSESSMENT / PLAN / RECOMMENDATIONS:   1) Type 1 Diabetes Mellitus, Poorly  controlled, Without complications - Most recent A1c of 8.4 %. Goal A1c < 7.0 %.     -Patient with worsening glycemic control, this is multifactorial given lack of basal insulin, lack of consistent insulin pump use, multiple hospitalization for pancreatitis -I have advised the patient that in  the future if he is going to be without any insulin pump, he will need to stay on basal insulin daily -Apparently he has been waiting on prior authorization for the Dexcom and the OmniPod, we were not aware of this until this past week -Prior authorization has been done today and was approved for the Dexcom and OmniPod, and new refill has been sent to the pharmacy for the next year -I also had refilled his Lantus in case is needed for any insulin pump interruptions in the future -Patient is not going to count his carbohydrates as he is inconsistent with this, patient will be advised to enter 6 g of carbohydrates with each meal    MEDICATIONS: NovoLog      Pump   OmniPod Settings   Insulin type   novolog   Basal rate       0000 0.5 u/h               I:C ratio       0000 1:1 Enter #6 TIDQAC                  Sensitivity       0000  50      Goal       0000  110         EDUCATION / INSTRUCTIONS: BG monitoring instructions: Patient is instructed to check his blood sugars 3 times a day, before meals . Call [facility] if: BG persistently < 70  I reviewed the Rule of 15 for the treatment of hypoglycemia in detail with the patient. Literature supplied.   2) Diabetic complications:  Eye:  Does not have known diabetic retinopathy.  Neuro/ Feet: Does not have known diabetic peripheral neuropathy. Renal: Patient does not have known baseline CKD. He is not on an ACEI/ARB at present.  3) Lipids: Lipid panel is acceptable, no indication for statin therapy at this time   Follow-up in 4 months Signed electronically by: Lyndle Herrlich, MD  [facility]  [facility] [address] [place] [place], [place] [address] Phone: [phone] FAX: [phone]   CC: [name], MD [address] [place] [address] Phone: [phone]  Fax: [phone]    Return to Endocrinology clinic as below: Future Appointments   Date Time Provider Department Center  04/01/2023  7:30 AM Lousie Calico, [name], MD [facility] None  04/06/2023 10:00 AM [name], MD [facility] None  05/25/2023  8:30 AM [name], [name], MD [facility]  02/13/2024  9:00 AM [name], Donalee Citrin, NP [facility] None

## 2023-04-01 NOTE — Telephone Encounter (Signed)
ERCP scheduled, pt instructed and medications reviewed.  Patient instructions mailed to home and sent to My Chart.  Patient to call with any questions or concerns.   He will keep appt as planned to see Dr [name]

## 2023-04-05 ENCOUNTER — Other Ambulatory Visit (HOSPITAL_COMMUNITY): Payer: Self-pay

## 2023-04-05 ENCOUNTER — Other Ambulatory Visit: Payer: Self-pay | Admitting: Family Medicine

## 2023-04-05 DIAGNOSIS — G8929 Other chronic pain: Secondary | ICD-10-CM

## 2023-04-05 MED ORDER — HYDROMORPHONE HCL 4 MG PO TABS
4.0000 mg | ORAL_TABLET | ORAL | 0 refills | Status: DC | PRN
Start: 2023-04-05 — End: 2023-04-06
  Filled 2023-04-05: qty 30, 5d supply, fill #0

## 2023-04-05 NOTE — Telephone Encounter (Signed)
Pharmacy requested refill.  Epic LR: 03/29/2023 Contract signed: 01/05/2023  Pended Rx and sent to Dr. Hyacinth Meeker for approval/denial

## 2023-04-06 ENCOUNTER — Ambulatory Visit (INDEPENDENT_AMBULATORY_CARE_PROVIDER_SITE_OTHER): Payer: Medicaid Other | Admitting: Family Medicine

## 2023-04-06 ENCOUNTER — Other Ambulatory Visit (HOSPITAL_COMMUNITY): Payer: Self-pay

## 2023-04-06 ENCOUNTER — Encounter: Payer: Self-pay | Admitting: Family Medicine

## 2023-04-06 ENCOUNTER — Ambulatory Visit: Payer: Medicaid Other | Admitting: Family Medicine

## 2023-04-06 VITALS — BP 138/88 | HR 86 | Temp 96.7°F | Ht 77.0 in | Wt 165.0 lb

## 2023-04-06 DIAGNOSIS — R1013 Epigastric pain: Secondary | ICD-10-CM

## 2023-04-06 DIAGNOSIS — E1065 Type 1 diabetes mellitus with hyperglycemia: Secondary | ICD-10-CM | POA: Diagnosis not present

## 2023-04-06 DIAGNOSIS — K859 Acute pancreatitis without necrosis or infection, unspecified: Secondary | ICD-10-CM

## 2023-04-06 DIAGNOSIS — Z7901 Long term (current) use of anticoagulants: Secondary | ICD-10-CM | POA: Diagnosis not present

## 2023-04-06 DIAGNOSIS — F1190 Opioid use, unspecified, uncomplicated: Secondary | ICD-10-CM | POA: Diagnosis not present

## 2023-04-06 DIAGNOSIS — F1029 Alcohol dependence with unspecified alcohol-induced disorder: Secondary | ICD-10-CM

## 2023-04-06 DIAGNOSIS — K861 Other chronic pancreatitis: Secondary | ICD-10-CM

## 2023-04-06 DIAGNOSIS — G8929 Other chronic pain: Secondary | ICD-10-CM

## 2023-04-06 MED ORDER — HYDROMORPHONE HCL 4 MG PO TABS
4.0000 mg | ORAL_TABLET | ORAL | 0 refills | Status: DC | PRN
Start: 2023-04-06 — End: 2023-04-30
  Filled 2023-04-06 – 2023-04-29 (×2): qty 30, 5d supply, fill #0

## 2023-04-06 NOTE — Progress Notes (Signed)
Provider:  Jacalyn Lefevre, MD  Careteam: Patient Care Team: [name], MD as PCP - General (Family Medicine) Flo Shanks, MD as Consulting Physician (Otolaryngology)  PLACE OF SERVICE:  [facility]  Advanced Directive information    Allergies  Allergen Reactions   Other Anaphylaxis and Other (See Comments)    [name] - anaphylaxis    Chief Complaint  Patient presents with   Medical Management of Chronic Issues    Patient presents today for a 3 month follow-up   Quality Metric Gaps    Hep C screening     HPI: Patient is a 37 y.o. male patient is here today to follow-up chronic pancreatitis and to get a drug screen for her drug contract.  I have been giving him Dilaudid for chronic pain and this is effective per his history.  He has reached out to another clinic to assume responsibility for pain management.  I believe that is a [facility] [employer] [facility] clinic.  As we transition over would expect him to sign a new drug contract.  He has a follow-up appointment with his clinic in about a month. As regards his diabetes he reports good control.  I do not see where we have done an A1c recently. He is scheduled for 1 more ERCP where he expects the stent to be removed.  Review of Systems:  Review of Systems  Constitutional: Negative.   Respiratory: Negative.    Cardiovascular: Negative.   Gastrointestinal:  Positive for abdominal pain.  Musculoskeletal: Negative.   Neurological: Negative.   Psychiatric/Behavioral: Negative.    All other systems reviewed and are negative.   Past Medical History:  Diagnosis Date   AKI (acute kidney injury) (HCC) 12/21/2021   Anxiety    Diabetes (HCC)    Pancreatitis    Splenic vein thrombosis    Past Surgical History:  Procedure Laterality Date   BILIARY BRUSHING  02/02/2023   Procedure: BILIARY BRUSHING;  Surgeon: [name] [name], MD;  Location: [facility];  Service: Gastroenterology;;   BILIARY DILATION  12/01/2022    Procedure: BILIARY DILATION;  Surgeon: Lemar Lofty., MD;  Location: [facility];  Service: Gastroenterology;;   BILIARY DILATION  02/02/2023   Procedure: BILIARY DILATION;  Surgeon: Lemar Lofty., MD;  Location: [facility];  Service: Gastroenterology;;   BIOPSY  12/02/2021   Procedure: BIOPSY;  Surgeon: Lemar Lofty., MD;  Location: [facility];  Service: Gastroenterology;;   ENDOSCOPIC RETROGRADE CHOLANGIOPANCREATOGRAPHY (ERCP) WITH PROPOFOL N/A 08/12/2022   Procedure: ENDOSCOPIC RETROGRADE CHOLANGIOPANCREATOGRAPHY (ERCP) WITH PROPOFOL;  Surgeon: Lemar Lofty., MD;  Location: [facility];  Service: Gastroenterology;  Laterality: N/A;   ENDOSCOPIC RETROGRADE CHOLANGIOPANCREATOGRAPHY (ERCP) WITH PROPOFOL N/A 12/01/2022   Procedure: ENDOSCOPIC RETROGRADE CHOLANGIOPANCREATOGRAPHY (ERCP) WITH PROPOFOL;  Surgeon: [name] [name], MD;  Location: [facility];  Service: Gastroenterology;  Laterality: N/A;   ENDOSCOPIC RETROGRADE CHOLANGIOPANCREATOGRAPHY (ERCP) WITH PROPOFOL N/A 02/02/2023   Procedure: ENDOSCOPIC RETROGRADE CHOLANGIOPANCREATOGRAPHY (ERCP) WITH PROPOFOL;  Surgeon: [name] [name], MD;  Location: [facility];  Service: Gastroenterology;  Laterality: N/A;   ESOPHAGOGASTRODUODENOSCOPY N/A 12/01/2022   Procedure: ESOPHAGOGASTRODUODENOSCOPY (EGD);  Surgeon: Lemar Lofty., MD;  Location: [facility];  Service: Gastroenterology;  Laterality: N/A;   ESOPHAGOGASTRODUODENOSCOPY (EGD) WITH PROPOFOL N/A 12/02/2021   Procedure: ESOPHAGOGASTRODUODENOSCOPY (EGD) WITH PROPOFOL;  Surgeon: [name] [name], MD;  Location: [facility];  Service: Gastroenterology;  Laterality: N/A;   FINE NEEDLE ASPIRATION N/A 12/01/2022   Procedure: FINE NEEDLE ASPIRATION (FNA) LINEAR;  Surgeon: Lemar Lofty., MD;  Location: [facility];  Service: Gastroenterology;  Laterality: N/A;   NEUROLYTIC CELIAC PLEXUS  12/01/2022   Procedure: CELIAC  PLEXUS BLOCK;  Surgeon: [name], [name], MD;  Location: [facility];  Service: Gastroenterology;;   NO PAST SURGERIES     PANCREATIC STENT PLACEMENT  08/12/2022   Procedure: PANCREATIC STENT PLACEMENT;  Surgeon: Lemar Lofty., MD;  Location: [facility];  Service: Gastroenterology;;   PANCREATIC STENT PLACEMENT  12/01/2022   Procedure: PANCREATIC STENT PLACEMENT;  Surgeon: Lemar Lofty., MD;  Location: [facility];  Service: Gastroenterology;;   PANCREATIC STENT PLACEMENT  02/02/2023   Procedure: PANCREATIC STENT PLACEMENT;  Surgeon: Lemar Lofty., MD;  Location: [facility];  Service: Gastroenterology;;   REMOVAL OF STONES  12/01/2022   Procedure: REMOVAL OF STONES;  Surgeon: Lemar Lofty., MD;  Location: [facility];  Service: Gastroenterology;;   REMOVAL OF STONES  02/02/2023   Procedure: REMOVAL OF STONES;  Surgeon: Lemar Lofty., MD;  Location: [facility];  Service: Gastroenterology;;   [name]  08/12/2022   Procedure: [name];  Surgeon: Lemar Lofty., MD;  Location: [facility];  Service: Gastroenterology;;   [name]  12/01/2022   Procedure: [name];  Surgeon: Lemar Lofty., MD;  Location: [facility];  Service: Gastroenterology;;   Francine Graven REMOVAL  12/01/2022   Procedure: STENT REMOVAL;  Surgeon: Lemar Lofty., MD;  Location: [facility];  Service: Gastroenterology;;   Francine Graven REMOVAL  02/02/2023   Procedure: STENT REMOVAL;  Surgeon: Lemar Lofty., MD;  Location: [facility];  Service: Gastroenterology;;   UPPER ESOPHAGEAL ENDOSCOPIC ULTRASOUND (EUS) N/A 12/02/2021   Procedure: UPPER ESOPHAGEAL ENDOSCOPIC ULTRASOUND (EUS);  Surgeon: Lemar Lofty., MD;  Location: [facility];  Service: Gastroenterology;  Laterality: N/A;   UPPER ESOPHAGEAL ENDOSCOPIC ULTRASOUND (EUS) N/A 12/01/2022   Procedure: UPPER ESOPHAGEAL ENDOSCOPIC ULTRASOUND (EUS);  Surgeon:  Lemar Lofty., MD;  Location: [facility];  Service: Gastroenterology;  Laterality: N/A;   Social History:   reports that he has been smoking cigars. He started smoking about 20 years ago. He has never used smokeless tobacco. He reports that he does not currently use alcohol. He reports that he does not use drugs.  Family History  Problem Relation Age of Onset   Hypertension Mother    Alcoholism Maternal Uncle    Diabetes Maternal Grandmother    Diabetes Maternal Grandfather    Diabetes Paternal Grandmother    Diabetes Paternal Grandfather    Colon cancer Neg Hx    Esophageal cancer Neg Hx    Inflammatory bowel disease Neg Hx    Liver disease Neg Hx    Pancreatic cancer Neg Hx    Rectal cancer Neg Hx    Stomach cancer Neg Hx     Medications: Patient's Medications  New Prescriptions   No medications on file  Previous Medications   ACETAMINOPHEN (TYLENOL) 325 MG TABLET    Take 2 tablets (650 mg total) by mouth every 6 (six) hours as needed for mild pain (or Fever >/= 101).   CONTINUOUS BLOOD GLUC SENSOR (DEXCOM G6 SENSOR) MISC    1 Device by Other route as directed. Change sensors every 10 days   CONTINUOUS BLOOD GLUC TRANSMIT (DEXCOM G6 TRANSMITTER) MISC    1 Device by Does not apply route as directed.   INSULIN ASPART (NOVOLOG) 100 UNIT/ML INJECTION    60 units a day   INSULIN DISPOSABLE PUMP (OMNIPOD 5 G6 POD, GEN 5,) MISC    1 Device by Does not  apply route every 3 (three) days.   INSULIN GLARGINE (LANTUS SOLOSTAR) 100 UNIT/ML SOLOSTAR PEN    Inject 16 Units into the skin See admin instructions. Inject 16 units into the skin once a day as directed when Omnipod is not attached   INSULIN PEN NEEDLE 32G X 4 MM MISC    1 Device by Does not apply route daily in the afternoon.   LIPASE/PROTEASE/AMYLASE (CREON) 36000 UNITS CPEP CAPSULE    Take 2 capsules (72,000 Units total) by mouth 3 (three) times daily before meals.   MULTIVITAMIN (ONE-A-DAY MEN'S) TABS TABLET    Take 1  tablet by mouth daily with breakfast.   OMEPRAZOLE (PRILOSEC) 40 MG CAPSULE    Take 1 capsule (40 mg total) by mouth daily.   ONDANSETRON (ZOFRAN) 8 MG TABLET    Take 1 tablet (8 mg total) by mouth every 8 (eight) hours as needed for nausea or vomiting.  Modified Medications   Modified Medication Previous Medication   HYDROMORPHONE (DILAUDID) 4 MG TABLET HYDROmorphone (DILAUDID) 4 MG tablet      Take 1 tablet (4 mg total) by mouth every 4 (four) hours as needed for severe pain.    Take 1 tablet (4 mg total) by mouth every 4 (four) hours as needed for severe pain.  Discontinued Medications   No medications on file    Physical Exam:  Vitals:   04/06/23 0950  BP: 138/88  Pulse: 86  Temp: (!) 96.7 F (35.9 C)  SpO2: 98%  Weight: 165 lb (74.8 kg)  Height: 6\' 5"  (1.956 m)   Body mass index is 19.57 kg/m. Wt Readings from Last 3 Encounters:  04/06/23 165 lb (74.8 kg)  02/15/23 164 lb 3.2 oz (74.5 kg)  02/03/23 165 lb 5.5 oz (75 kg)    Physical Exam Vitals and nursing note reviewed.  Constitutional:      Appearance: Normal appearance.  Cardiovascular:     Rate and Rhythm: Normal rate and regular rhythm.  Pulmonary:     Effort: Pulmonary effort is normal.     Breath sounds: Normal breath sounds.  Abdominal:     General: Bowel sounds are normal.     Palpations: Abdomen is soft.     Tenderness: There is abdominal tenderness.  Neurological:     General: No focal deficit present.     Mental Status: He is alert and oriented to person, place, and time.  Psychiatric:        Mood and Affect: Mood normal.        Behavior: Behavior normal.     Labs reviewed: Basic Metabolic Panel: Recent Labs    07/22/22 0541 08/02/22 0914 08/15/22 0328 08/16/22 0316 08/18/22 0318 10/12/22 1631 02/03/23 1229 02/03/23 1646 02/04/23 0430 02/05/23 0455  NA 138   < > 135 136 136   < > 133*  --  131* 133*  K 4.4   < > 3.5 3.7 4.1   < > 3.2*  --  4.1 3.5  CL 99   < > 102 102 102   < > 96*   --  99 101  CO2 30   < > 24 27 28    < > 26  --  23 23  GLUCOSE 148*   < > 188* 252* 204*   < > 192*  --  179* 197*  BUN <5*   < > 8 6 8    < > 10  --  7 6  CREATININE 0.93   < >  0.70 0.82 0.80   < > 0.80 0.61 0.77 0.76  CALCIUM 9.9   < > 8.4* 8.5* 8.9   < > 8.6*  --  8.3* 8.2*  MG 1.9  --  1.7 1.7 1.7  --   --   --   --   --   PHOS 4.0  --   --   --   --   --   --   --   --   --    < > = values in this interval not displayed.   Liver Function Tests: Recent Labs    02/03/23 1229 02/04/23 0430 02/05/23 0455  AST 35 25 15  ALT 29 23 17   ALKPHOS 131* 117 102  BILITOT 0.7 0.7 0.8  PROT 7.2 6.3* 5.9*  ALBUMIN 3.8 3.2* 3.1*   Recent Labs    11/10/22 2303 02/03/23 1229 02/05/23 0455  LIPASE 66* 27 26   No results for input(s): "AMMONIA" in the last 8760 hours. CBC: Recent Labs    08/13/22 1934 08/15/22 0328 11/10/22 2303 11/12/22 0443 02/03/23 1229 02/03/23 1646 02/04/23 0430 02/05/23 0455  WBC 6.3   < > 5.7   < > 4.8 4.4 3.9* 4.0  NEUTROABS 4.6  --  3.6  --  3.0  --   --   --   HGB 14.1   < > 14.8   < > 14.0 14.8 13.3 13.1  HCT 42.2   < > 44.3   < > 41.5 45.6 40.4 39.4  MCV 98.6   < > 104.2*   < > 104.5* 107.8* 107.2* 104.2*  PLT 187   < > 222   < > 156 154 150 154   < > = values in this interval not displayed.   Lipid Panel: Recent Labs    04/27/22 0953 07/20/22 0215  TRIG 90 153*   TSH: No results for input(s): "TSH" in the last 8760 hours. A1C: Lab Results  Component Value Date   HGBA1C 8.4 (H) 11/03/2022     Assessment/Plan  1. Abdominal pain, chronic, epigastric Pain is currently controlled with Dilaudid.  As described above we will transition him over to pain at [facility]  2. Chronic anticoagulation clinic  Continues with anticoagulant.  History of splenic vein thrombosis  3. Type 1 diabetes mellitus with hyperglycemia (HCC) Using glucose monitor and pump.  Last A1c was 8.4.  Need to update today  4. Opioid use Has been compliant with drug  screens and other parameters of drug contract to my knowledge  5. Acute on chronic pancreatitis (HCC) Pancreatitis is more chronic now.  No recent acute flares  6. 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  Encouraged and praised to continue   Jacalyn Lefevre, MD [facility] [phone]

## 2023-04-07 LAB — HEMOGLOBIN A1C
Hgb A1c MFr Bld: 8.6 % of total Hgb — ABNORMAL HIGH (ref <5.7)
Mean Plasma Glucose: 200 mg/dL
eAG (mmol/L): 11.1 mmol/L

## 2023-04-08 LAB — DRUG MONITOR, PANEL 1, W/CONF, URINE
Amphetamines: NEGATIVE ng/mL (ref <500)
Barbiturates: NEGATIVE ng/mL (ref <300)
Benzodiazepines: NEGATIVE ng/mL (ref <100)
Cocaine Metabolite: NEGATIVE ng/mL (ref <150)
Codeine: NEGATIVE ng/mL (ref <50)
Creatinine: 192 mg/dL (ref >=20.0)
Hydrocodone: NEGATIVE ng/mL (ref <50)
Hydromorphone: >10000 ng/mL — ABNORMAL HIGH (ref <50)
Marijuana Metabolite: NEGATIVE ng/mL (ref <20)
Methadone Metabolite: NEGATIVE ng/mL (ref <100)
Morphine: NEGATIVE ng/mL (ref <50)
Norhydrocodone: NEGATIVE ng/mL (ref <50)
Opiates: POSITIVE ng/mL — AB (ref <100)
Oxidant: NEGATIVE ug/mL (ref <200)
Oxycodone: NEGATIVE ng/mL (ref <100)
Phencyclidine: NEGATIVE ng/mL (ref <25)
pH: 5.7 (ref 4.5–9.0)

## 2023-04-08 LAB — DM TEMPLATE

## 2023-04-22 ENCOUNTER — Inpatient Hospital Stay (HOSPITAL_COMMUNITY)
Admission: EM | Admit: 2023-04-22 | Discharge: 2023-04-30 | DRG: 438 | Disposition: A | Discharge status: Home / Self Care | Payer: Medicaid Other | Attending: Family Medicine | Admitting: Family Medicine

## 2023-04-22 ENCOUNTER — Other Ambulatory Visit: Payer: Self-pay

## 2023-04-22 ENCOUNTER — Emergency Department (HOSPITAL_COMMUNITY): Payer: Medicaid Other

## 2023-04-22 ENCOUNTER — Encounter (HOSPITAL_COMMUNITY): Payer: Self-pay

## 2023-04-22 DIAGNOSIS — K860 Alcohol-induced chronic pancreatitis: Secondary | ICD-10-CM | POA: Diagnosis present

## 2023-04-22 DIAGNOSIS — F17290 Nicotine dependence, other tobacco product, uncomplicated: Secondary | ICD-10-CM | POA: Diagnosis present

## 2023-04-22 DIAGNOSIS — G8929 Other chronic pain: Secondary | ICD-10-CM | POA: Diagnosis present

## 2023-04-22 DIAGNOSIS — K859 Acute pancreatitis without necrosis or infection, unspecified: Principal | ICD-10-CM | POA: Diagnosis present

## 2023-04-22 DIAGNOSIS — Z811 Family history of alcohol abuse and dependence: Secondary | ICD-10-CM | POA: Diagnosis not present

## 2023-04-22 DIAGNOSIS — E440 Moderate protein-calorie malnutrition: Secondary | ICD-10-CM | POA: Diagnosis present

## 2023-04-22 DIAGNOSIS — E871 Hypo-osmolality and hyponatremia: Secondary | ICD-10-CM | POA: Diagnosis present

## 2023-04-22 DIAGNOSIS — K8689 Other specified diseases of pancreas: Secondary | ICD-10-CM | POA: Diagnosis not present

## 2023-04-22 DIAGNOSIS — E876 Hypokalemia: Secondary | ICD-10-CM | POA: Diagnosis not present

## 2023-04-22 DIAGNOSIS — R030 Elevated blood-pressure reading, without diagnosis of hypertension: Secondary | ICD-10-CM | POA: Diagnosis present

## 2023-04-22 DIAGNOSIS — K861 Other chronic pancreatitis: Secondary | ICD-10-CM

## 2023-04-22 DIAGNOSIS — F1021 Alcohol dependence, in remission: Secondary | ICD-10-CM | POA: Diagnosis present

## 2023-04-22 DIAGNOSIS — E860 Dehydration: Secondary | ICD-10-CM | POA: Diagnosis present

## 2023-04-22 DIAGNOSIS — E1065 Type 1 diabetes mellitus with hyperglycemia: Secondary | ICD-10-CM | POA: Diagnosis present

## 2023-04-22 DIAGNOSIS — Z681 Body mass index (BMI) 19 or less, adult: Secondary | ICD-10-CM

## 2023-04-22 DIAGNOSIS — Z8249 Family history of ischemic heart disease and other diseases of the circulatory system: Secondary | ICD-10-CM | POA: Diagnosis not present

## 2023-04-22 DIAGNOSIS — K831 Obstruction of bile duct: Secondary | ICD-10-CM

## 2023-04-22 DIAGNOSIS — R1011 Right upper quadrant pain: Secondary | ICD-10-CM | POA: Diagnosis present

## 2023-04-22 DIAGNOSIS — Z833 Family history of diabetes mellitus: Secondary | ICD-10-CM

## 2023-04-22 DIAGNOSIS — E109 Type 1 diabetes mellitus without complications: Secondary | ICD-10-CM | POA: Diagnosis not present

## 2023-04-22 DIAGNOSIS — R933 Abnormal findings on diagnostic imaging of other parts of digestive tract: Secondary | ICD-10-CM

## 2023-04-22 DIAGNOSIS — R7989 Other specified abnormal findings of blood chemistry: Secondary | ICD-10-CM | POA: Diagnosis not present

## 2023-04-22 DIAGNOSIS — F17210 Nicotine dependence, cigarettes, uncomplicated: Secondary | ICD-10-CM | POA: Diagnosis not present

## 2023-04-22 DIAGNOSIS — Z86718 Personal history of other venous thrombosis and embolism: Secondary | ICD-10-CM | POA: Diagnosis not present

## 2023-04-22 DIAGNOSIS — Z794 Long term (current) use of insulin: Secondary | ICD-10-CM | POA: Diagnosis not present

## 2023-04-22 DIAGNOSIS — K760 Fatty (change of) liver, not elsewhere classified: Secondary | ICD-10-CM | POA: Diagnosis present

## 2023-04-22 LAB — URINALYSIS, ROUTINE W REFLEX MICROSCOPIC
Bacteria, UA: NONE SEEN
Glucose, UA: >=500 mg/dL — AB
Hgb urine dipstick: NEGATIVE
Ketones, ur: 20 mg/dL — AB
Leukocytes,Ua: NEGATIVE
Nitrite: NEGATIVE
Protein, ur: 30 mg/dL — AB
Specific Gravity, Urine: 1.025 (ref 1.005–1.030)
pH: 5 (ref 5.0–8.0)

## 2023-04-22 LAB — COMPREHENSIVE METABOLIC PANEL
ALT: 97 U/L — ABNORMAL HIGH (ref 0–44)
AST: 167 U/L — ABNORMAL HIGH (ref 15–41)
Albumin: 3.2 g/dL — ABNORMAL LOW (ref 3.5–5.0)
Alkaline Phosphatase: 1064 U/L — ABNORMAL HIGH (ref 38–126)
Anion gap: 12 (ref 5–15)
BUN: 13 mg/dL (ref 6–20)
CO2: 21 mmol/L — ABNORMAL LOW (ref 22–32)
Calcium: 9 mg/dL (ref 8.9–10.3)
Chloride: 100 mmol/L (ref 98–111)
Creatinine, Ser: 0.74 mg/dL (ref 0.61–1.24)
GFR, Estimated: >60 mL/min (ref >60)
Glucose, Bld: 165 mg/dL — ABNORMAL HIGH (ref 70–99)
Potassium: 3.5 mmol/L (ref 3.5–5.1)
Sodium: 133 mmol/L — ABNORMAL LOW (ref 135–145)
Total Bilirubin: 5.8 mg/dL — ABNORMAL HIGH (ref 0.3–1.2)
Total Protein: 7.2 g/dL (ref 6.5–8.1)

## 2023-04-22 LAB — CBC WITH DIFFERENTIAL/PLATELET
Abs Immature Granulocytes: 0.02 10*3/uL (ref 0.00–0.07)
Basophils Absolute: 0 10*3/uL (ref 0.0–0.1)
Basophils Relative: 1 %
Eosinophils Absolute: 0.1 10*3/uL (ref 0.0–0.5)
Eosinophils Relative: 3 %
HCT: 42.3 % (ref 39.0–52.0)
Hemoglobin: 14 g/dL (ref 13.0–17.0)
Immature Granulocytes: 0 %
Lymphocytes Relative: 12 %
Lymphs Abs: 0.6 10*3/uL — ABNORMAL LOW (ref 0.7–4.0)
MCH: 34.7 pg — ABNORMAL HIGH (ref 26.0–34.0)
MCHC: 33.1 g/dL (ref 30.0–36.0)
MCV: 105 fL — ABNORMAL HIGH (ref 80.0–100.0)
Monocytes Absolute: 0.7 10*3/uL (ref 0.1–1.0)
Monocytes Relative: 14 %
Neutro Abs: 3.6 10*3/uL (ref 1.7–7.7)
Neutrophils Relative %: 70 %
Platelets: 179 10*3/uL (ref 150–400)
RBC: 4.03 MIL/uL — ABNORMAL LOW (ref 4.22–5.81)
RDW: 12.6 % (ref 11.5–15.5)
WBC: 5.1 10*3/uL (ref 4.0–10.5)
nRBC: 0 % (ref 0.0–0.2)

## 2023-04-22 LAB — LIPASE, BLOOD: Lipase: 19 U/L (ref 11–51)

## 2023-04-22 LAB — CBG MONITORING, ED: Glucose-Capillary: 121 mg/dL — ABNORMAL HIGH (ref 70–99)

## 2023-04-22 MED ORDER — INSULIN GLARGINE-YFGN 100 UNIT/ML ~~LOC~~ SOLN
5.0000 [IU] | Freq: Every day | SUBCUTANEOUS | Status: DC
  Administered 2023-04-23 – 2023-04-25 (×3): 5 [IU] via SUBCUTANEOUS
  Filled 2023-04-22 (×5): qty 0.05

## 2023-04-22 MED ORDER — HYDROMORPHONE HCL 1 MG/ML IJ SOLN
0.5000 mg | INTRAMUSCULAR | Status: DC | PRN
  Administered 2023-04-22 – 2023-04-23 (×4): 1 mg via INTRAVENOUS
  Filled 2023-04-22 (×4): qty 1

## 2023-04-22 MED ORDER — INSULIN ASPART 100 UNIT/ML IJ SOLN
0.0000 [IU] | INTRAMUSCULAR | Status: DC
  Administered 2023-04-23: 1 [IU] via SUBCUTANEOUS
  Administered 2023-04-23 (×2): 2 [IU] via SUBCUTANEOUS
  Administered 2023-04-23 – 2023-04-24 (×2): 1 [IU] via SUBCUTANEOUS
  Administered 2023-04-24 (×2): 2 [IU] via SUBCUTANEOUS
  Administered 2023-04-24: 1 [IU] via SUBCUTANEOUS
  Administered 2023-04-24 – 2023-04-25 (×4): 2 [IU] via SUBCUTANEOUS
  Administered 2023-04-25: 1 [IU] via SUBCUTANEOUS
  Administered 2023-04-25: 2 [IU] via SUBCUTANEOUS
  Administered 2023-04-25: 3 [IU] via SUBCUTANEOUS
  Administered 2023-04-26 (×2): 5 [IU] via SUBCUTANEOUS
  Administered 2023-04-26 (×3): 2 [IU] via SUBCUTANEOUS
  Administered 2023-04-26: 1 [IU] via SUBCUTANEOUS
  Administered 2023-04-27: 2 [IU] via SUBCUTANEOUS
  Administered 2023-04-27: 1 [IU] via SUBCUTANEOUS
  Administered 2023-04-27: 3 [IU] via SUBCUTANEOUS
  Administered 2023-04-27: 1 [IU] via SUBCUTANEOUS
  Administered 2023-04-27 (×2): 3 [IU] via SUBCUTANEOUS
  Administered 2023-04-28: 5 [IU] via SUBCUTANEOUS
  Administered 2023-04-28 (×3): 2 [IU] via SUBCUTANEOUS
  Administered 2023-04-29: 3 [IU] via SUBCUTANEOUS
  Administered 2023-04-29: 1 [IU] via SUBCUTANEOUS
  Administered 2023-04-29: 2 [IU] via SUBCUTANEOUS
  Administered 2023-04-29 (×2): 5 [IU] via SUBCUTANEOUS
  Administered 2023-04-29: 3 [IU] via SUBCUTANEOUS
  Administered 2023-04-30: 2 [IU] via SUBCUTANEOUS
  Administered 2023-04-30: 5 [IU] via SUBCUTANEOUS
  Administered 2023-04-30: 2 [IU] via SUBCUTANEOUS
  Administered 2023-04-30: 3 [IU] via SUBCUTANEOUS
  Filled 2023-04-22: qty 0.09

## 2023-04-22 MED ORDER — ACETAMINOPHEN 325 MG PO TABS: 650.0000 mg | ORAL_TABLET | Freq: Four times a day (QID) | ORAL | Status: DC | PRN

## 2023-04-22 MED ORDER — HYDROMORPHONE HCL 1 MG/ML IJ SOLN
1.0000 mg | Freq: Once | INTRAMUSCULAR | Status: AC
  Administered 2023-04-22: 1 mg via INTRAVENOUS
  Filled 2023-04-22: qty 1

## 2023-04-22 MED ORDER — PANTOPRAZOLE SODIUM 40 MG PO TBEC
40.0000 mg | DELAYED_RELEASE_TABLET | Freq: Every day | ORAL | Status: DC
  Administered 2023-04-23: 40 mg via ORAL
  Filled 2023-04-22: qty 1

## 2023-04-22 MED ORDER — ACETAMINOPHEN 650 MG RE SUPP: 650.0000 mg | Freq: Four times a day (QID) | RECTAL | Status: DC | PRN

## 2023-04-22 MED ORDER — LACTATED RINGERS IV BOLUS
1000.0000 mL | Freq: Once | INTRAVENOUS | Status: AC
  Administered 2023-04-22: 1000 mL via INTRAVENOUS

## 2023-04-22 MED ORDER — KETOROLAC TROMETHAMINE 15 MG/ML IJ SOLN
15.0000 mg | Freq: Once | INTRAMUSCULAR | Status: AC
  Administered 2023-04-22: 15 mg via INTRAVENOUS
  Filled 2023-04-22: qty 1

## 2023-04-22 MED ORDER — PROCHLORPERAZINE EDISYLATE 10 MG/2ML IJ SOLN: 5.0000 mg | INTRAMUSCULAR | Status: DC | PRN

## 2023-04-22 MED ORDER — MORPHINE SULFATE (PF) 4 MG/ML IV SOLN
4.0000 mg | Freq: Once | INTRAVENOUS | Status: AC
  Administered 2023-04-22: 4 mg via INTRAVENOUS
  Filled 2023-04-22: qty 1

## 2023-04-22 MED ORDER — IOHEXOL 300 MG/ML  SOLN
100.0000 mL | Freq: Once | INTRAMUSCULAR | Status: AC | PRN
  Administered 2023-04-22: 100 mL via INTRAVENOUS

## 2023-04-22 MED ORDER — OXYCODONE HCL 5 MG PO TABS
5.0000 mg | ORAL_TABLET | ORAL | Status: DC | PRN
  Administered 2023-04-22 – 2023-04-24 (×4): 5 mg via ORAL
  Filled 2023-04-22 (×4): qty 1

## 2023-04-22 MED ORDER — LACTATED RINGERS IV SOLN: INTRAVENOUS | Status: DC

## 2023-04-22 MED ORDER — ONDANSETRON HCL 4 MG/2ML IJ SOLN
4.0000 mg | Freq: Once | INTRAMUSCULAR | Status: AC
  Administered 2023-04-22: 4 mg via INTRAVENOUS
  Filled 2023-04-22: qty 2

## 2023-04-22 NOTE — ED Triage Notes (Addendum)
Patient has had epigastric abdominal pain for [redacted] weeks along with vomiting. Pain radiates to his right flank. No diarrhea. Stated his urine is a yellow copper color.

## 2023-04-22 NOTE — ED Notes (Signed)
[facility] notified for need of transport.  

## 2023-04-22 NOTE — ED Provider Notes (Signed)
[place] EMERGENCY DEPARTMENT AT [facility] ([facility]) Provider Note  CSN: [id number] Arrival date & time: 04/22/23 1428  Chief Complaint(s) Abdominal Pain  HPI Carlos [name] is a 37 y.o. male with PMH chronic pancreatitis on oral Dilaudid daily previously requiring balloon dilatation and stent placement who presents emergency room for evaluation of abdominal pain nausea and vomiting.  States that pain has been present for the last 2 weeks but acutely worsened over the last 72 hours and now he is vomiting and unable to tolerate p.o.  Endorsing significant right upper quadrant pain with radiation back into the right flank.  Also noticing an abnormal color of his urine where he states that it looks like a fluorescent orange.  Denies chest pain, shortness of breath, headache, fever or other systemic symptoms.   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   Intractable pain 08/16/2022   Post-ERCP acute pancreatitis 08/16/2022   Hyperbilirubinemia 07/20/2022   Normocytic anemia 07/20/2022   Elevated blood pressure reading 07/20/2022   Gastroesophageal reflux disease with esophagitis without hemorrhage 04/27/2022   History of alcohol abuse 04/27/2022   Acute on chronic pancreatitis (HCC) 04/26/2022   Chronic pancreatitis (HCC) 03/07/2022   Idiopathic acute pancreatitis without infection or necrosis 03/07/2022   Acute esophagitis 03/07/2022   Abnormal findings on EGD/EUS 03/07/2022    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   Dehydration 12/21/2021   Uncontrolled type 1 diabetes mellitus with hyperglycemia, with long-term current use of insulin (HCC) 12/07/2021   Candida esophagitis (HCC) 12/07/2021   RUQ pain    Diabetes mellitus due to underlying condition without complication, without long-term current use of insulin (HCC) 10/13/2021   Abnormal LFTs 10/13/2021   Protein-calorie malnutrition, severe 09/15/2021   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)    Diabetic ketoacidosis without coma associated with type 1 diabetes mellitus (HCC) 09/13/2021   Chronic anticoagulation 08/22/2021   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 Authorizing Provider  acetaminophen (TYLENOL) 325 MG tablet Take 2 tablets (650 mg total) by mouth every 6 (six) hours as needed for mild pain (or Fever >/= 101). 07/22/22   Standley Brooking, MD  Continuous Blood Gluc Sensor (DEXCOM G6 SENSOR) MISC 1 Device by Other route as directed. Change sensors every 10 days Patient taking differently: 1 Device by Other route See admin instructions. Place a new sensor into the skin every 10 days 11/17/22   [name], [name], MD  Continuous Blood Gluc Transmit (  DEXCOM G6 TRANSMITTER) MISC 1 Device by Does not apply route as directed. 11/17/22   [name], [name], MD  HYDROmorphone (DILAUDID) 4 MG tablet Take 1 tablet (4 mg total) by mouth every 4 (four) hours as  needed for severe pain. 04/06/23   [name], MD  insulin aspart (NOVOLOG) 100 UNIT/ML injection 60 units a day Patient taking differently: Inject 60 Units into the skin See admin instructions. 60 units a day, per Omnipod (or as otherwise instructed); bolus is 5 units 12/14/22   [name], [name], MD  Insulin Disposable Pump (OMNIPOD 5 G6 POD, GEN 5,) MISC 1 Device by Does not apply route every 3 (three) days. 11/17/22   [name], [name], MD  insulin glargine (LANTUS SOLOSTAR) 100 UNIT/ML Solostar Pen Inject 16 Units into the skin See admin instructions. Inject 16 units into the skin once a day as directed when Omnipod is not attached 03/10/22   [provider]  Insulin Pen Needle 32G X 4 MM MISC 1 Device by Does not apply route daily in the afternoon. 11/17/22   [name], [name], MD  lipase/protease/amylase (CREON) 36000 UNITS CPEP capsule Take 2 capsules (72,000 Units total) by mouth 3 (three) times daily before meals. Patient taking differently: Take 36,000-72,000 Units by mouth See admin instructions. Take 72,000 units by mouth three times a day before meals and 36,000 units with any snacks 09/22/22   [name], [name], MD  multivitamin (ONE-A-DAY MEN'S) TABS tablet Take 1 tablet by mouth daily with breakfast.    [provider]  omeprazole (PRILOSEC) 40 MG capsule Take 1 capsule (40 mg total) by mouth daily. Patient taking differently: Take 40 mg by mouth daily before breakfast. 05/27/22   [name], Dinah C, NP  ondansetron (ZOFRAN) 8 MG tablet Take 1 tablet (8 mg total) by mouth every 8 (eight) hours as needed for nausea or vomiting. 04/23/22   Fayette Pho, MD                                                                                                                                    Past Surgical History Past Surgical History:  Procedure Laterality Date   BILIARY BRUSHING  02/02/2023   Procedure: BILIARY BRUSHING;  Surgeon:  Lemar Lofty., MD;  Location: [facility];  Service: Gastroenterology;;   BILIARY DILATION  12/01/2022   Procedure: BILIARY DILATION;  Surgeon: Lemar Lofty., MD;  Location: [facility];  Service: Gastroenterology;;   BILIARY DILATION  02/02/2023   Procedure: BILIARY DILATION;  Surgeon: Lemar Lofty., MD;  Location: [facility];  Service: Gastroenterology;;   BIOPSY  12/02/2021   Procedure: BIOPSY;  Surgeon: Lemar Lofty., MD;  Location: [facility];  Service: Gastroenterology;;   ENDOSCOPIC RETROGRADE CHOLANGIOPANCREATOGRAPHY (ERCP) WITH PROPOFOL N/A 08/12/2022   Procedure: ENDOSCOPIC RETROGRADE CHOLANGIOPANCREATOGRAPHY (ERCP) WITH PROPOFOL;  Surgeon: Lemar Lofty., MD;  Location: [facility];  Service: Gastroenterology;  Laterality:  N/A;   ENDOSCOPIC RETROGRADE CHOLANGIOPANCREATOGRAPHY (ERCP) WITH PROPOFOL N/A 12/01/2022   Procedure: ENDOSCOPIC RETROGRADE CHOLANGIOPANCREATOGRAPHY (ERCP) WITH PROPOFOL;  Surgeon: [name] [name], MD;  Location: [facility];  Service: Gastroenterology;  Laterality: N/A;   ENDOSCOPIC RETROGRADE CHOLANGIOPANCREATOGRAPHY (ERCP) WITH PROPOFOL N/A 02/02/2023   Procedure: ENDOSCOPIC RETROGRADE CHOLANGIOPANCREATOGRAPHY (ERCP) WITH PROPOFOL;  Surgeon: [name] [name], MD;  Location: [facility];  Service: Gastroenterology;  Laterality: N/A;   ESOPHAGOGASTRODUODENOSCOPY N/A 12/01/2022   Procedure: ESOPHAGOGASTRODUODENOSCOPY (EGD);  Surgeon: Lemar Lofty., MD;  Location: [facility];  Service: Gastroenterology;  Laterality: N/A;   ESOPHAGOGASTRODUODENOSCOPY (EGD) WITH PROPOFOL N/A 12/02/2021   Procedure: ESOPHAGOGASTRODUODENOSCOPY (EGD) WITH PROPOFOL;  Surgeon: [name] [name], MD;  Location: [facility];  Service: Gastroenterology;  Laterality: N/A;   FINE NEEDLE ASPIRATION N/A 12/01/2022   Procedure: FINE NEEDLE ASPIRATION (FNA) LINEAR;  Surgeon: Lemar Lofty., MD;  Location: [facility];  Service: Gastroenterology;  Laterality: N/A;   NEUROLYTIC CELIAC PLEXUS  12/01/2022   Procedure: CELIAC PLEXUS BLOCK;  Surgeon: [name], [name], MD;  Location: [facility];  Service: Gastroenterology;;   NO PAST SURGERIES     PANCREATIC STENT PLACEMENT  08/12/2022   Procedure: PANCREATIC STENT PLACEMENT;  Surgeon: Lemar Lofty., MD;  Location: [facility];  Service: Gastroenterology;;   PANCREATIC STENT PLACEMENT  12/01/2022   Procedure: PANCREATIC STENT PLACEMENT;  Surgeon: Lemar Lofty., MD;  Location: [facility];  Service: Gastroenterology;;   PANCREATIC STENT PLACEMENT  02/02/2023   Procedure: PANCREATIC STENT PLACEMENT;  Surgeon: Lemar Lofty., MD;  Location: [facility];  Service: Gastroenterology;;   REMOVAL OF STONES  12/01/2022   Procedure: REMOVAL OF STONES;  Surgeon: Lemar Lofty., MD;  Location: [facility];  Service: Gastroenterology;;   REMOVAL OF STONES  02/02/2023   Procedure: REMOVAL OF STONES;  Surgeon: Lemar Lofty., MD;  Location: [facility];  Service: Gastroenterology;;   [name]  08/12/2022   Procedure: [name];  Surgeon: Lemar Lofty., MD;  Location: [facility];  Service: Gastroenterology;;   [name]  12/01/2022   Procedure: [name];  Surgeon: Lemar Lofty., MD;  Location: [facility];  Service: Gastroenterology;;   Francine Graven REMOVAL  12/01/2022   Procedure: STENT REMOVAL;  Surgeon: Lemar Lofty., MD;  Location: [facility];  Service: Gastroenterology;;   Francine Graven REMOVAL  02/02/2023   Procedure: STENT REMOVAL;  Surgeon: Lemar Lofty., MD;  Location: [facility];  Service: Gastroenterology;;   UPPER ESOPHAGEAL ENDOSCOPIC ULTRASOUND (EUS) N/A 12/02/2021   Procedure: UPPER ESOPHAGEAL ENDOSCOPIC ULTRASOUND (EUS);  Surgeon: Lemar Lofty., MD;  Location: [facility];  Service: Gastroenterology;  Laterality: N/A;   UPPER ESOPHAGEAL  ENDOSCOPIC ULTRASOUND (EUS) N/A 12/01/2022   Procedure: UPPER ESOPHAGEAL ENDOSCOPIC ULTRASOUND (EUS);  Surgeon: Lemar Lofty., MD;  Location: [facility];  Service: Gastroenterology;  Laterality: N/A;   Family History Family History  Problem Relation Age of Onset   Hypertension Mother    Alcoholism Maternal Uncle    Diabetes Maternal Grandmother    Diabetes Maternal Grandfather    Diabetes Paternal Grandmother    Diabetes Paternal Grandfather    Colon cancer Neg Hx    Esophageal cancer Neg Hx    Inflammatory bowel disease Neg Hx    Liver disease Neg Hx    Pancreatic cancer Neg Hx    Rectal cancer Neg Hx    Stomach cancer Neg Hx     Social History Social History   Tobacco Use   Smoking status: Some Days    Types:  Cigars    Start date: 11/08/2002   Smokeless tobacco: Never   Tobacco comments:    Once a week.  Vaping Use   Vaping Use: Never used  Substance Use Topics   Alcohol use: Not Currently   Drug use: Never   Allergies Other  Review of Systems Review of Systems  Gastrointestinal:  Positive for abdominal pain, nausea and vomiting.    Physical Exam Vital Signs  I have reviewed the triage vital signs BP (!) 130/95   Pulse 77   Temp 98.1 F (36.7 C) (Oral)   Resp 18   Ht 6\' 5"  (1.956 m)   Wt 79.4 kg   SpO2 96%   BMI 20.75 kg/m   Physical Exam Constitutional:      General: He is not in acute distress.    Appearance: Normal appearance.  HENT:     Head: Normocephalic and atraumatic.     Nose: No congestion or rhinorrhea.  Eyes:     General:        Right eye: No discharge.        Left eye: No discharge.     Extraocular Movements: Extraocular movements intact.     Pupils: Pupils are equal, round, and reactive to light.  Cardiovascular:     Rate and Rhythm: Normal rate and regular rhythm.     Heart sounds: No murmur heard. Pulmonary:     Effort: No respiratory distress.     Breath sounds: No wheezing or rales.  Abdominal:     General:  There is no distension.     Tenderness: There is abdominal tenderness in the right upper quadrant and epigastric area.  Musculoskeletal:        General: Normal range of motion.     Cervical back: Normal range of motion.  Skin:    General: Skin is warm and dry.  Neurological:     General: No focal deficit present.     Mental Status: He is alert.     ED Results and Treatments Labs (all labs ordered are listed, but only abnormal results are displayed) Labs Reviewed  CBC WITH DIFFERENTIAL/PLATELET  COMPREHENSIVE METABOLIC PANEL  LIPASE, BLOOD  URINALYSIS, ROUTINE W REFLEX MICROSCOPIC                                                                                                                          Radiology No results found.  Pertinent labs & imaging results that were available during my care of the patient were reviewed by me and considered in my medical decision making (see MDM for details).  Medications Ordered in ED Medications - No data to display  Procedures Procedures  (including critical care time)  Medical Decision Making / ED Course   This patient presents to the ED for concern of abdominal pain, this involves an extensive number of treatment options, and is a complaint that carries with it a high risk of complications and morbidity.  The differential diagnosis includes cholecystitis, cholelithiasis/biliary colic, choledocholithiasis, ascending cholangitis, acute hepatitis, pancreatitis, GERD, pneumonia, constipation, nephrolithiasis  MDM: Patient seen emergency room for evaluation of right upper quadrant abdominal pain.  Physical exam with tenderness in the right upper quadrant and right CVA, epigastrium.  Laboratory evaluation with no significant leukocytosis but patient's LFTs are concerning with an AST of 167, ALT 97, significant alk  phos elevation to 1064 and a significant bilirubin elevation to 5.8.  Previous labs 2 months ago showed normal LFTs.  Lipase is normal.  CT abdomen pelvis showing acute pancreatitis and gallbladder enlargement but pancreatic stent is in stable position and no overt evidence of cholecystitis or biliary dilatation.  I spoke with Dr. Elnoria Howard of gastroenterology who is recommending touching base with surgery as the gallbladder does look bigger than normal but he will evaluate the patient.  I spoke with Dr. Luisa Hart of general surgery who is recommending right upper quadrant ultrasound and admission to [facility] as the patient will likely need an ERCP but need to rule out biliary stricture prior to discussions of surgery.  Patient then admitted to medicine.   Additional history obtained: -Additional history obtained from partner -External records from outside source obtained and reviewed including: Chart review including previous notes, labs, imaging, consultation notes   Lab Tests: -I ordered, reviewed, and interpreted labs.   The pertinent results include:   Labs Reviewed  CBC WITH DIFFERENTIAL/PLATELET  COMPREHENSIVE METABOLIC PANEL  LIPASE, BLOOD  URINALYSIS, ROUTINE W REFLEX MICROSCOPIC       Imaging Studies ordered: I ordered imaging studies including CT abdomen pelvis, right upper quadrant ultrasound I independently visualized and interpreted imaging. I agree with the radiologist interpretation   Medicines ordered and prescription drug management: No orders of the defined types were placed in this encounter.   -I have reviewed the patients home medicines and have made adjustments as needed  Critical interventions none  Consultations Obtained: I requested consultation with the general surgeon on-call Dr. Luisa Hart, gastroenterologist on-call Dr. Elnoria Howard,  and discussed lab and imaging findings as well as pertinent plan - they recommend: Right upper quadrant ultrasound, hospital  mission   Cardiac Monitoring: The patient was maintained on a cardiac monitor.  I personally viewed and interpreted the cardiac monitored which showed an underlying rhythm of: NSR  Social Determinants of Health:  Factors impacting patients care include: none   Reevaluation: After the interventions noted above, I reevaluated the patient and found that they have :improved  Co morbidities that complicate the patient evaluation  Past Medical History:  Diagnosis Date   AKI (acute kidney injury) (HCC) 12/21/2021   Anxiety    Diabetes (HCC)    Pancreatitis    Splenic vein thrombosis       Dispostion: I considered admission for this patient, and due to pancreatitis with persistent abdominal pain patient require hospital admission     Final Clinical Impression(s) / ED Diagnoses Final diagnoses:  None     @PCDICTATION @    Glendora Score, MD 04/22/23 1857

## 2023-04-22 NOTE — H&P (Signed)
History and Physical    Nas Dudziak ZOX:[id number] DOB: 1986/07/04 DOA: 04/22/2023  PCP: [name], MD   Patient coming from: Home   Chief Complaint: Abdominal pain, N/V   HPI: Bently Wilmore is a 37 y.o. male with medical history significant for alcoholism in remission, insulin-dependent diabetes mellitus, chronic pancreatitis, pancreatic duct stricture with stent, and history of splenic vein thrombosis failure presents with severe upper abdominal pain, nausea, and vomiting.  Patient reports 2 weeks of right upper quadrant pain which has worsened significantly over the past 72 hours and is now associated with nausea and vomiting.  Urine has become orange.  He denies fever or chills.  ED Course: Upon arrival to the ED, patient is found to be afebrile and saturating well on room air with normal heart rate and stable blood pressure.  Labs are most notable for alkaline phosphatase 1064, AST 167, ALT 97, total bilirubin 5.8, and lipase 19.  GI (Dr. Elnoria Howard) and surgery (Dr. Luisa Hart) were consulted by the ED physician and admission to [facility] was recommended.  Patient was given Dilaudid, morphine, Toradol, 1 L of LR, and Zofran in the ED.  Review of Systems:  All other systems reviewed and apart from HPI, are negative.  Past Medical History:  Diagnosis Date   AKI (acute kidney injury) (HCC) 12/21/2021   Anxiety    Diabetes (HCC)    Pancreatitis    Splenic vein thrombosis     Past Surgical History:  Procedure Laterality Date   BILIARY BRUSHING  02/02/2023   Procedure: BILIARY BRUSHING;  Surgeon: [name] [name], MD;  Location: [facility];  Service: Gastroenterology;;   BILIARY DILATION  12/01/2022   Procedure: BILIARY DILATION;  Surgeon: Lemar Lofty., MD;  Location: [facility];  Service: Gastroenterology;;   BILIARY DILATION  02/02/2023   Procedure: BILIARY DILATION;  Surgeon: Lemar Lofty., MD;  Location: [facility];  Service:  Gastroenterology;;   BIOPSY  12/02/2021   Procedure: BIOPSY;  Surgeon: Lemar Lofty., MD;  Location: [facility];  Service: Gastroenterology;;   ENDOSCOPIC RETROGRADE CHOLANGIOPANCREATOGRAPHY (ERCP) WITH PROPOFOL N/A 08/12/2022   Procedure: ENDOSCOPIC RETROGRADE CHOLANGIOPANCREATOGRAPHY (ERCP) WITH PROPOFOL;  Surgeon: Lemar Lofty., MD;  Location: [facility];  Service: Gastroenterology;  Laterality: N/A;   ENDOSCOPIC RETROGRADE CHOLANGIOPANCREATOGRAPHY (ERCP) WITH PROPOFOL N/A 12/01/2022   Procedure: ENDOSCOPIC RETROGRADE CHOLANGIOPANCREATOGRAPHY (ERCP) WITH PROPOFOL;  Surgeon: [name] [name], MD;  Location: [facility];  Service: Gastroenterology;  Laterality: N/A;   ENDOSCOPIC RETROGRADE CHOLANGIOPANCREATOGRAPHY (ERCP) WITH PROPOFOL N/A 02/02/2023   Procedure: ENDOSCOPIC RETROGRADE CHOLANGIOPANCREATOGRAPHY (ERCP) WITH PROPOFOL;  Surgeon: [name] [name], MD;  Location: [facility];  Service: Gastroenterology;  Laterality: N/A;   ESOPHAGOGASTRODUODENOSCOPY N/A 12/01/2022   Procedure: ESOPHAGOGASTRODUODENOSCOPY (EGD);  Surgeon: Lemar Lofty., MD;  Location: [facility];  Service: Gastroenterology;  Laterality: N/A;   ESOPHAGOGASTRODUODENOSCOPY (EGD) WITH PROPOFOL N/A 12/02/2021   Procedure: ESOPHAGOGASTRODUODENOSCOPY (EGD) WITH PROPOFOL;  Surgeon: [name] [name], MD;  Location: [facility];  Service: Gastroenterology;  Laterality: N/A;   FINE NEEDLE ASPIRATION N/A 12/01/2022   Procedure: FINE NEEDLE ASPIRATION (FNA) LINEAR;  Surgeon: Lemar Lofty., MD;  Location: [facility];  Service: Gastroenterology;  Laterality: N/A;   NEUROLYTIC CELIAC PLEXUS  12/01/2022   Procedure: CELIAC PLEXUS BLOCK;  Surgeon: [name] [name], MD;  Location: [facility];  Service: Gastroenterology;;   NO PAST SURGERIES     PANCREATIC STENT PLACEMENT  08/12/2022   Procedure: PANCREATIC STENT PLACEMENT;  Surgeon: Lemar Lofty., MD;  Location: [facility];  Service: Gastroenterology;;   PANCREATIC STENT PLACEMENT  12/01/2022   Procedure: PANCREATIC STENT PLACEMENT;  Surgeon: [name] [name], MD;  Location: [facility];  Service: Gastroenterology;;   PANCREATIC STENT PLACEMENT  02/02/2023   Procedure: PANCREATIC STENT PLACEMENT;  Surgeon: Lemar Lofty., MD;  Location: [facility];  Service: Gastroenterology;;   REMOVAL OF STONES  12/01/2022   Procedure: REMOVAL OF STONES;  Surgeon: Lemar Lofty., MD;  Location: [facility];  Service: Gastroenterology;;   REMOVAL OF STONES  02/02/2023   Procedure: REMOVAL OF STONES;  Surgeon: Lemar Lofty., MD;  Location: [facility];  Service: Gastroenterology;;   [name]  08/12/2022   Procedure: [name];  Surgeon: Lemar Lofty., MD;  Location: [facility];  Service: Gastroenterology;;   [name]  12/01/2022   Procedure: [name];  Surgeon: Lemar Lofty., MD;  Location: [facility];  Service: Gastroenterology;;   Francine Graven REMOVAL  12/01/2022   Procedure: STENT REMOVAL;  Surgeon: Lemar Lofty., MD;  Location: [facility];  Service: Gastroenterology;;   Francine Graven REMOVAL  02/02/2023   Procedure: STENT REMOVAL;  Surgeon: Lemar Lofty., MD;  Location: [facility];  Service: Gastroenterology;;   UPPER ESOPHAGEAL ENDOSCOPIC ULTRASOUND (EUS) N/A 12/02/2021   Procedure: UPPER ESOPHAGEAL ENDOSCOPIC ULTRASOUND (EUS);  Surgeon: Lemar Lofty., MD;  Location: [facility];  Service: Gastroenterology;  Laterality: N/A;   UPPER ESOPHAGEAL ENDOSCOPIC ULTRASOUND (EUS) N/A 12/01/2022   Procedure: UPPER ESOPHAGEAL ENDOSCOPIC ULTRASOUND (EUS);  Surgeon: Lemar Lofty., MD;  Location: [facility];  Service: Gastroenterology;  Laterality: N/A;    Social History:   reports that he has been smoking cigars. He started smoking about 20 years ago. He has never used smokeless tobacco. He reports that he  does not currently use alcohol. He reports that he does not use drugs.  Allergies  Allergen Reactions   Other Anaphylaxis and Other (See Comments)    [name] - anaphylaxis    Family History  Problem Relation Age of Onset   Hypertension Mother    Alcoholism Maternal Uncle    Diabetes Maternal Grandmother    Diabetes Maternal Grandfather    Diabetes Paternal Grandmother    Diabetes Paternal Grandfather    Colon cancer Neg Hx    Esophageal cancer Neg Hx    Inflammatory bowel disease Neg Hx    Liver disease Neg Hx    Pancreatic cancer Neg Hx    Rectal cancer Neg Hx    Stomach cancer Neg Hx      Prior to Admission medications   Medication Sig Start Date End Date Taking? Authorizing Provider  acetaminophen (TYLENOL) 325 MG tablet Take 2 tablets (650 mg total) by mouth every 6 (six) hours as needed for mild pain (or Fever >/= 101). 07/22/22   Standley Brooking, MD  Continuous Blood Gluc Sensor (DEXCOM G6 SENSOR) MISC 1 Device by Other route as directed. Change sensors every 10 days Patient taking differently: 1 Device by Other route See admin instructions. Place a new sensor into the skin every 10 days 11/17/22   [name], [name], MD  Continuous Blood Gluc Transmit (DEXCOM G6 TRANSMITTER) MISC 1 Device by Does not apply route as directed. 11/17/22   [name], [name], MD  HYDROmorphone (DILAUDID) 4 MG tablet Take 1 tablet (4 mg total) by mouth every 4 (four) hours as needed for severe pain. 04/06/23   [name], MD  insulin aspart (NOVOLOG) 100 UNIT/ML injection 60 units a day Patient taking differently: Inject  60 Units into the skin See admin instructions. 60 units a day, per Omnipod (or as otherwise instructed); bolus is 5 units 12/14/22   [name], [name], MD  Insulin Disposable Pump (OMNIPOD 5 G6 POD, GEN 5,) MISC 1 Device by Does not apply route every 3 (three) days. 11/17/22   [name], [name], MD  insulin glargine (LANTUS SOLOSTAR)  100 UNIT/ML Solostar Pen Inject 16 Units into the skin See admin instructions. Inject 16 units into the skin once a day as directed when Omnipod is not attached 03/10/22   [provider]  Insulin Pen Needle 32G X 4 MM MISC 1 Device by Does not apply route daily in the afternoon. 11/17/22   [name], [name], MD  lipase/protease/amylase (CREON) 36000 UNITS CPEP capsule Take 2 capsules (72,000 Units total) by mouth 3 (three) times daily before meals. Patient taking differently: Take 36,000-72,000 Units by mouth See admin instructions. Take 72,000 units by mouth three times a day before meals and 36,000 units with any snacks 09/22/22   [name], [name], MD  multivitamin (ONE-A-DAY MEN'S) TABS tablet Take 1 tablet by mouth daily with breakfast.    [provider]  omeprazole (PRILOSEC) 40 MG capsule Take 1 capsule (40 mg total) by mouth daily. Patient taking differently: Take 40 mg by mouth daily before breakfast. 05/27/22   [name], Dinah C, NP  ondansetron (ZOFRAN) 8 MG tablet Take 1 tablet (8 mg total) by mouth every 8 (eight) hours as needed for nausea or vomiting. 04/23/22   Fayette Pho, MD    Physical Exam: Vitals:   04/22/23 1710 04/22/23 1800 04/22/23 1821 04/22/23 1830  BP: (!) 148/100 (!) 150/99  129/88  Pulse: 81 79  75  Resp: 18 18  18   Temp:   98.5 F (36.9 C)   TempSrc:   Oral   SpO2: 100% 100%  99%  Weight:      Height:         Constitutional: NAD, no pallor or diaphoresis  Eyes: PERTLA, lids and conjunctivae normal ENMT: Mucous membranes are moist. Posterior pharynx clear of any exudate or lesions.   Neck: supple, no masses  Respiratory: no wheezing, no crackles. No accessory muscle use.  Cardiovascular: S1 & S2 heard, regular rate and rhythm. No extremity edema.   Abdomen: Soft, tender in upper abdomen without rebound pain or guarding. Bowel sounds active.  Musculoskeletal: no clubbing / cyanosis. No joint deformity upper and lower  extremities.   Skin: no significant rashes, lesions, ulcers. Warm, dry, well-perfused. Neurologic: CN 2-12 grossly intact. Moving all extremities. Alert and oriented.  Psychiatric: Calm. Cooperative.    Labs and Imaging on Admission: I have personally reviewed following labs and imaging studies  CBC: Recent Labs  Lab 04/22/23 1509  WBC 5.1  NEUTROABS 3.6  HGB 14.0  HCT 42.3  MCV 105.0*  PLT 179   Basic Metabolic Panel: Recent Labs  Lab 04/22/23 1509  NA 133*  K 3.5  CL 100  CO2 21*  GLUCOSE 165*  BUN 13  CREATININE 0.74  CALCIUM 9.0   GFR: Estimated Creatinine Clearance: 142 mL/min (by C-G formula based on SCr of 0.74 mg/dL). Liver Function Tests: Recent Labs  Lab 04/22/23 1509  AST 167*  ALT 97*  ALKPHOS 1,064*  BILITOT 5.8*  PROT 7.2  ALBUMIN 3.2*   Recent Labs  Lab 04/22/23 1509  LIPASE 19   No results for input(s): "AMMONIA" in the last 168 hours. Coagulation Profile: No results for input(s): "  INR", "PROTIME" in the last 168 hours. Cardiac Enzymes: No results for input(s): "CKTOTAL", "CKMB", "CKMBINDEX", "TROPONINI" in the last 168 hours. BNP (last 3 results) No results for input(s): "PROBNP" in the last 8760 hours. HbA1C: No results for input(s): "HGBA1C" in the last 72 hours. CBG: No results for input(s): "GLUCAP" in the last 168 hours. Lipid Profile: No results for input(s): "CHOL", "HDL", "LDLCALC", "TRIG", "CHOLHDL", "LDLDIRECT" in the last 72 hours. Thyroid Function Tests: No results for input(s): "TSH", "T4TOTAL", "FREET4", "T3FREE", "THYROIDAB" in the last 72 hours. Anemia Panel: No results for input(s): "VITAMINB12", "FOLATE", "FERRITIN", "TIBC", "IRON", "RETICCTPCT" in the last 72 hours. Urine analysis:    Component Value Date/Time   COLORURINE AMBER (A) 04/22/2023 1838   APPEARANCEUR CLEAR 04/22/2023 1838   LABSPEC 1.025 04/22/2023 1838   PHURINE 5.0 04/22/2023 1838   GLUCOSEU >=500 (A) 04/22/2023 1838   HGBUR NEGATIVE  04/22/2023 1838   BILIRUBINUR MODERATE (A) 04/22/2023 1838   KETONESUR 20 (A) 04/22/2023 1838   PROTEINUR 30 (A) 04/22/2023 1838   NITRITE NEGATIVE 04/22/2023 1838   LEUKOCYTESUR NEGATIVE 04/22/2023 1838   Sepsis Labs: @LABRCNTIP (procalcitonin:4,lacticidven:4) )No results found for this or any previous visit (from the past 240 hour(s)).   Radiological Exams on Admission: CT ABDOMEN PELVIS W CONTRAST  Result Date: 04/22/2023 CLINICAL DATA:  Epigastric pain EXAM: CT ABDOMEN AND PELVIS WITH CONTRAST TECHNIQUE: Multidetector CT imaging of the abdomen and pelvis was performed using the standard protocol following bolus administration of intravenous contrast. RADIATION DOSE REDUCTION: This exam was performed according to the departmental dose-optimization program which includes automated exposure control, adjustment of the mA and/or kV according to patient size and/or use of iterative reconstruction technique. CONTRAST:  OMNIPAQUE IOHEXOL 300 MG/ML  SOLN COMPARISON:  CT of the abdomen without and with contrast 02/03/2023. FINDINGS: Lower chest: The lung bases are clear without focal nodule, mass, or airspace disease. Heart size is normal. No significant pleural or pericardial effusion is present. Hepatobiliary: Mild fatty infiltration of the liver is again noted. No discrete lesions are present. Wall thickening is present about the gallbladder. Pancreas: Pancreatic duct stent is stable in position. Diffuse inflammatory changes are again noted about the head of the pancreas. No discrete cystic lesions are present. The pancreas enhances throughout. Spleen: Normal in size without focal abnormality. Adrenals/Urinary Tract: The adrenal glands are normal bilaterally. Kidneys are unremarkable. No stone or mass lesion is present. Ureters are normal bilaterally. The urinary bladder is within normal limits. Stomach/Bowel: Mild inflammatory changes are present the distal stomach and duodenum, likely secondary to  the pancreatic inflammation. The small bowel is otherwise unremarkable. Terminal ileum is within normal limits. The appendix is not discretely visualized and may be surgically absent. The ascending and transverse colon are within normal limits. Descending and sigmoid colon are normal. Vascular/Lymphatic: No significant vascular findings are present. No enlarged abdominal or pelvic lymph nodes. Reproductive: Prostate is unremarkable. Other: A small amount of free fluid is present within the anatomic pelvis. No significant ventral hernia is present. Musculoskeletal: The vertebral body heights and alignment are normal. No focal osseous lesions are present. The hips are located. IMPRESSION: 1. Diffuse inflammatory changes about the head of the pancreas compatible with acute pancreatitis. No complicating features. 2. Pancreatic duct stent is stable in position. 3. Hepatic steatosis. 4. Small amount of free fluid within the anatomic pelvis is likely reactive. Electronically Signed   By: Marin Roberts M.D.   On: 04/22/2023 17:35    Assessment/Plan   1.  Acute on chronic pancreatitis; pancreatic duct stricture; elevated LFTs  - Check RUQ [place], bowel rest, IVF hydration, pain-control, repeat labs in am, follow-up GI and surgery recommendations    2. Insulin-dependent DM  - A1c was 8.6% in May 2024  - Check CBGs and use long- and short-acting insulin     DVT prophylaxis: SCDs  Code Status: Full  Level of Care: Level of care: Med-Surg Family Communication: none present  Disposition Plan:  Patient is from: home  Anticipated d/c is to: home  Anticipated d/c date is: 04/25/23  Patient currently: pending RUQ [place], GI and surgery consultations, pain-control, tolerance of adequate oral intake  Consults called: GI, surgery Admission status: Inpatient    Briscoe Deutscher, MD [facility]  04/22/2023, 7:23 PM

## 2023-04-23 DIAGNOSIS — K861 Other chronic pancreatitis: Secondary | ICD-10-CM | POA: Diagnosis not present

## 2023-04-23 DIAGNOSIS — K859 Acute pancreatitis without necrosis or infection, unspecified: Secondary | ICD-10-CM | POA: Diagnosis not present

## 2023-04-23 LAB — GLUCOSE, CAPILLARY
Glucose-Capillary: 103 mg/dL — ABNORMAL HIGH (ref 70–99)
Glucose-Capillary: 133 mg/dL — ABNORMAL HIGH (ref 70–99)
Glucose-Capillary: 136 mg/dL — ABNORMAL HIGH (ref 70–99)
Glucose-Capillary: 176 mg/dL — ABNORMAL HIGH (ref 70–99)
Glucose-Capillary: 174 mg/dL — ABNORMAL HIGH (ref 70–99)

## 2023-04-23 LAB — COMPREHENSIVE METABOLIC PANEL
ALT: 86 U/L — ABNORMAL HIGH (ref 0–44)
AST: 146 U/L — ABNORMAL HIGH (ref 15–41)
Albumin: 2.6 g/dL — ABNORMAL LOW (ref 3.5–5.0)
Alkaline Phosphatase: 1098 U/L — ABNORMAL HIGH (ref 38–126)
Anion gap: 10 (ref 5–15)
BUN: 9 mg/dL (ref 6–20)
CO2: 24 mmol/L (ref 22–32)
Calcium: 8.5 mg/dL — ABNORMAL LOW (ref 8.9–10.3)
Chloride: 98 mmol/L (ref 98–111)
Creatinine, Ser: 0.81 mg/dL (ref 0.61–1.24)
GFR, Estimated: >60 mL/min (ref >60)
Glucose, Bld: 147 mg/dL — ABNORMAL HIGH (ref 70–99)
Potassium: 3.4 mmol/L — ABNORMAL LOW (ref 3.5–5.1)
Sodium: 132 mmol/L — ABNORMAL LOW (ref 135–145)
Total Bilirubin: 5.9 mg/dL — ABNORMAL HIGH (ref 0.3–1.2)
Total Protein: 6 g/dL — ABNORMAL LOW (ref 6.5–8.1)

## 2023-04-23 LAB — CBC
HCT: 38.2 % — ABNORMAL LOW (ref 39.0–52.0)
Hemoglobin: 13 g/dL (ref 13.0–17.0)
MCH: 34.4 pg — ABNORMAL HIGH (ref 26.0–34.0)
MCHC: 34 g/dL (ref 30.0–36.0)
MCV: 101.1 fL — ABNORMAL HIGH (ref 80.0–100.0)
Platelets: 162 10*3/uL (ref 150–400)
RBC: 3.78 MIL/uL — ABNORMAL LOW (ref 4.22–5.81)
RDW: 12.8 % (ref 11.5–15.5)
WBC: 5.4 10*3/uL (ref 4.0–10.5)
nRBC: 0 % (ref 0.0–0.2)

## 2023-04-23 LAB — MAGNESIUM: Magnesium: 1.5 mg/dL — ABNORMAL LOW (ref 1.7–2.4)

## 2023-04-23 MED ORDER — LACTATED RINGERS IV SOLN: INTRAVENOUS | Status: DC

## 2023-04-23 MED ORDER — SODIUM CHLORIDE 0.9 % IV SOLN
3.0000 g | Freq: Four times a day (QID) | INTRAVENOUS | Status: DC
  Administered 2023-04-23 – 2023-04-30 (×27): 3 g via INTRAVENOUS
  Filled 2023-04-23 (×27): qty 8

## 2023-04-23 MED ORDER — PANTOPRAZOLE SODIUM 40 MG IV SOLR: 40.0000 mg | Freq: Two times a day (BID) | INTRAVENOUS | Status: DC

## 2023-04-23 MED ORDER — PANTOPRAZOLE SODIUM 40 MG IV SOLR
40.0000 mg | Freq: Two times a day (BID) | INTRAVENOUS | Status: DC
  Administered 2023-04-23 – 2023-04-30 (×12): 40 mg via INTRAVENOUS
  Filled 2023-04-23 (×12): qty 10

## 2023-04-23 MED ORDER — MAGNESIUM SULFATE 2 GM/50ML IV SOLN
2.0000 g | Freq: Once | INTRAVENOUS | Status: AC
  Administered 2023-04-23: 2 g via INTRAVENOUS
  Filled 2023-04-23: qty 50

## 2023-04-23 MED ORDER — HYDROMORPHONE HCL 1 MG/ML IJ SOLN
0.5000 mg | INTRAMUSCULAR | Status: DC | PRN
  Administered 2023-04-23 – 2023-04-29 (×33): 1 mg via INTRAVENOUS
  Filled 2023-04-23 (×33): qty 1

## 2023-04-23 MED ORDER — POTASSIUM CHLORIDE 10 MEQ/100ML IV SOLN
10.0000 meq | Freq: Once | INTRAVENOUS | Status: AC
  Administered 2023-04-23: 10 meq via INTRAVENOUS
  Filled 2023-04-23: qty 100

## 2023-04-23 MED ORDER — POTASSIUM CHLORIDE 10 MEQ/100ML IV SOLN
10.0000 meq | INTRAVENOUS | Status: AC
  Administered 2023-04-23 (×2): 10 meq via INTRAVENOUS
  Filled 2023-04-23 (×2): qty 100

## 2023-04-23 NOTE — Progress Notes (Signed)
PROGRESS NOTE    Carlos [name]  ZOX:[id number] DOB: [birth year] DOA: 04/22/2023 PCP: [name], MD   Brief Narrative: 37 year old with past medical history significant for alcoholism and in remission, insulin-dependent diabetes, chronic pancreatitis, pancreatic duct stricture with a stent, history of splenic vein thrombosis presented with severe upper abdominal pain, nausea and vomiting.  He report right upper quadrant abdominal pain for the last 2 weeks worse over the last 72 hours, associated with nausea and vomiting.  He was found to have significant elevation of alkaline phosphatase 1064, transaminases AST 167 ALT 97, bilirubin 5.8, lipase 19.  CT abdomen and pelvis: Showed diffuse inflammatory changes about the head of the pancreas, compatible with acute pancreatitis.  Pancreatic duct stent is in a stable position.  Hepatic asteatosis.  A small amount of free fluid within the anatomic pelvis.  Right upper quadrant ultrasound showed gallbladder calcification, most likely gallstone, layering sludge and wall thickening.  Changes are nonspecific for acute cholecystitis.     Assessment & Plan:   Principal Problem:   Acute on chronic pancreatitis (HCC) Active Problems:   Uncontrolled type 1 diabetes mellitus with hyperglycemia, with long-term current use of insulin (HCC)   Elevated LFTs   Stricture of pancreatic duct  1-Acute on chronic pancreatitis, pancreatic ductal stricture, transaminases, Hyperbilirubinemia:  Concern for Cholecystitis.  CT abdomen Pelvis;  Diffuse inflammatory changes about the head of the pancreas compatible with acute pancreatitis. No complicating features. Pancreatic duct stent is stable in position. Hepatic steatosis. -RUQ [place]: Gallbladder calcification, most likely gallstones but could indicate focal mural calcification. Layering sludge and wall thickening. . Changes are nonspecific for acute cholecystitis -Started IV Unasyn.  -Continue with IV dilaudid PRN 2  Q hours.  -PRN Zofran.  -GI consulted, recommend surgery evaluation.  -Dr Bedelia Person consulted.  -IV fluids. IV PPI.   Insulin-dependent diabetes type 2: On low dose insulin, insulin dependent. Monitor CBG SSI.   Hyponatremia: IV fluids.   Hypokalemia; Replaced IV KCL.      Estimated body mass index is 20.75 kg/m as calculated from the following:   Height as of this encounter: 6\' 5"  (1.956 m).   Weight as of this encounter: 79.4 kg.   DVT prophylaxis: SCD Code Status: Full code Family Communication:Care discussed with patient.  Disposition Plan:  Status is: Inpatient Remains inpatient appropriate because: management of Transaminases, abdominal pain.     Consultants:  GI General Surgery   Procedures:  RUQ [place]; Gallbladder calcification, most likely gallstones but could indicate focal mural calcification. Layering sludge and wall thickening. Murphy's sign is negative. Changes are nonspecific for acute cholecystitis. Clinical correlation is suggested.    Antimicrobials:  IV Unasyn.  Subjective: He report epigastric and RUQ pain. Usually pain with his pancreatitis is only epigastric, this time radiates to RUQ. He report nausea, vomiting.   Objective: Vitals:   04/23/23 0135 04/23/23 0210 04/23/23 0416 04/23/23 0638  BP:  (!) 157/109 (!) 136/109 133/85  Pulse:  88 90 77  Resp:  18 18   Temp: 98.3 F (36.8 C) 97.9 F (36.6 C) 97.8 F (36.6 C)   TempSrc: Oral Oral Oral   SpO2:  100% 100%   Weight:      Height:        Intake/Output Summary (Last 24 hours) at 04/23/2023 0712 Last data filed at 04/23/2023 0200 Gross per 24 hour  Intake 1460.42 ml  Output --  Net 1460.42 ml   Filed Weights   04/22/23 1435  Weight: 79.4 kg  Examination:  General exam: Appears calm and comfortable  Respiratory system: Clear to auscultation. Respiratory effort normal. Cardiovascular system: S1 & S2 heard, RRR.  Gastrointestinal system: Abdomen is nondistended, soft,  tender epigastrium and Right upper quadrant.  Central nervous system: Alert and oriented. Extremities: Symmetric 5 x 5 power.   Data Reviewed: I have personally reviewed following labs and imaging studies  CBC: Recent Labs  Lab 04/22/23 1509 04/23/23 0303  WBC 5.1 5.4  NEUTROABS 3.6  --   HGB 14.0 13.0  HCT 42.3 38.2*  MCV 105.0* 101.1*  PLT 179 162   Basic Metabolic Panel: Recent Labs  Lab 04/22/23 1509 04/23/23 0303  NA 133* 132*  K 3.5 3.4*  CL 100 98  CO2 21* 24  GLUCOSE 165* 147*  BUN 13 9  CREATININE 0.74 0.81  CALCIUM 9.0 8.5*  MG  --  1.5*   GFR: Estimated Creatinine Clearance: 140.2 mL/min (by C-G formula based on SCr of 0.81 mg/dL). Liver Function Tests: Recent Labs  Lab 04/22/23 1509 04/23/23 0303  AST 167* 146*  ALT 97* 86*  ALKPHOS 1,064* 1,098*  BILITOT 5.8* 5.9*  PROT 7.2 6.0*  ALBUMIN 3.2* 2.6*   Recent Labs  Lab 04/22/23 1509  LIPASE 19   No results for input(s): "AMMONIA" in the last 168 hours. Coagulation Profile: No results for input(s): "INR", "PROTIME" in the last 168 hours. Cardiac Enzymes: No results for input(s): "CKTOTAL", "CKMB", "CKMBINDEX", "TROPONINI" in the last 168 hours. BNP (last 3 results) No results for input(s): "PROBNP" in the last 8760 hours. HbA1C: No results for input(s): "HGBA1C" in the last 72 hours. CBG: Recent Labs  Lab 04/22/23 1957 04/23/23 0526  GLUCAP 121* 103*   Lipid Profile: No results for input(s): "CHOL", "HDL", "LDLCALC", "TRIG", "CHOLHDL", "LDLDIRECT" in the last 72 hours. Thyroid Function Tests: No results for input(s): "TSH", "T4TOTAL", "FREET4", "T3FREE", "THYROIDAB" in the last 72 hours. Anemia Panel: No results for input(s): "VITAMINB12", "FOLATE", "FERRITIN", "TIBC", "IRON", "RETICCTPCT" in the last 72 hours. Sepsis Labs: No results for input(s): "PROCALCITON", "LATICACIDVEN" in the last 168 hours.  No results found for this or any previous visit (from the past 240 hour(s)).        Radiology Studies: US Abdomen Limited RUQ (LIVER/GB)  Result Date: 04/22/2023 CLINICAL DATA:  Right upper quadrant pain. EXAM: ULTRASOUND ABDOMEN LIMITED RIGHT UPPER QUADRANT COMPARISON:  CT 04/22/2023 FINDINGS: Gallbladder: Calcification along the nondependent edge of the gallbladder likely represents layering stones but could indicate focal gallbladder wall calcification. Layering sludge in the gallbladder. Gallbladder wall is thickened at 4.6 mm. Murphy's sign is negative. Common bile duct: Diameter: 5 mm, normal Liver: Increased liver parenchymal echotexture suggesting fatty infiltration. No focal lesions. Portal vein is patent on color Doppler imaging with normal direction of blood flow towards the liver. Other: None. IMPRESSION: Gallbladder calcification, most likely gallstones but could indicate focal mural calcification. Layering sludge and wall thickening. Murphy's sign is negative. Changes are nonspecific for acute cholecystitis. Clinical correlation is suggested. Electronically Signed   By: Burman Nieves M.D.   On: 04/22/2023 20:15   CT ABDOMEN PELVIS W CONTRAST  Result Date: 04/22/2023 CLINICAL DATA:  Epigastric pain EXAM: CT ABDOMEN AND PELVIS WITH CONTRAST TECHNIQUE: Multidetector CT imaging of the abdomen and pelvis was performed using the standard protocol following bolus administration of intravenous contrast. RADIATION DOSE REDUCTION: This exam was performed according to the departmental dose-optimization program which includes automated exposure control, adjustment of the mA and/or kV according to patient size  and/or use of iterative reconstruction technique. CONTRAST:  OMNIPAQUE IOHEXOL 300 MG/ML  SOLN COMPARISON:  CT of the abdomen without and with contrast 02/03/2023. FINDINGS: Lower chest: The lung bases are clear without focal nodule, mass, or airspace disease. Heart size is normal. No significant pleural or pericardial effusion is present. Hepatobiliary: Mild  fatty infiltration of the liver is again noted. No discrete lesions are present. Wall thickening is present about the gallbladder. Pancreas: Pancreatic duct stent is stable in position. Diffuse inflammatory changes are again noted about the head of the pancreas. No discrete cystic lesions are present. The pancreas enhances throughout. Spleen: Normal in size without focal abnormality. Adrenals/Urinary Tract: The adrenal glands are normal bilaterally. Kidneys are unremarkable. No stone or mass lesion is present. Ureters are normal bilaterally. The urinary bladder is within normal limits. Stomach/Bowel: Mild inflammatory changes are present the distal stomach and duodenum, likely secondary to the pancreatic inflammation. The small bowel is otherwise unremarkable. Terminal ileum is within normal limits. The appendix is not discretely visualized and may be surgically absent. The ascending and transverse colon are within normal limits. Descending and sigmoid colon are normal. Vascular/Lymphatic: No significant vascular findings are present. No enlarged abdominal or pelvic lymph nodes. Reproductive: Prostate is unremarkable. Other: A small amount of free fluid is present within the anatomic pelvis. No significant ventral hernia is present. Musculoskeletal: The vertebral body heights and alignment are normal. No focal osseous lesions are present. The hips are located. IMPRESSION: 1. Diffuse inflammatory changes about the head of the pancreas compatible with acute pancreatitis. No complicating features. 2. Pancreatic duct stent is stable in position. 3. Hepatic steatosis. 4. Small amount of free fluid within the anatomic pelvis is likely reactive. Electronically Signed   By: Marin Roberts M.D.   On: 04/22/2023 17:35        Scheduled Meds:  insulin aspart  0-9 Units Subcutaneous Q4H   insulin glargine-yfgn  5 Units Subcutaneous QHS   pantoprazole  40 mg Oral Daily   Continuous Infusions:  lactated ringers  125 mL/hr at 04/23/23 0200     LOS: 1 day    Time spent: 35 minutes    Casimer Russett A Cornie Mccomber, MD [facility]   If 7PM-7AM, please contact night-coverage [email or web address]  04/23/2023, 7:12 AM

## 2023-04-23 NOTE — Consult Note (Addendum)
Reason for Consult/Chief Complaint: eval for cholecystitis Consultant: Elnoria Howard, MD  Carlos [name] is an 37 y.o. male.   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 Localizes pain to the epigastrium and the RUQ. GBW thickening on CT and transaminitis coupled with hyperbilirubinemia. No obvious CBD dilation on CT.   Past Medical History:  Diagnosis Date   AKI (acute kidney injury) (HCC) 12/21/2021   Anxiety    Diabetes (HCC)    Pancreatitis    Splenic vein thrombosis     Past Surgical History:  Procedure Laterality Date   BILIARY BRUSHING  02/02/2023   Procedure: BILIARY BRUSHING;  Surgeon: [name] [name], MD;  Location: [facility];  Service: Gastroenterology;;   BILIARY DILATION  12/01/2022   Procedure: BILIARY DILATION;  Surgeon: Lemar Lofty., MD;  Location: [facility];  Service: Gastroenterology;;   BILIARY DILATION  02/02/2023   Procedure: BILIARY DILATION;  Surgeon: Lemar Lofty., MD;  Location: [facility];  Service: Gastroenterology;;   BIOPSY  12/02/2021   Procedure: BIOPSY;  Surgeon: Lemar Lofty., MD;  Location: [facility];  Service: Gastroenterology;;   ENDOSCOPIC RETROGRADE CHOLANGIOPANCREATOGRAPHY (ERCP) WITH PROPOFOL N/A 08/12/2022   Procedure: ENDOSCOPIC RETROGRADE CHOLANGIOPANCREATOGRAPHY (ERCP) WITH PROPOFOL;  Surgeon: Lemar Lofty., MD;  Location: [facility];  Service: Gastroenterology;  Laterality: N/A;   ENDOSCOPIC RETROGRADE CHOLANGIOPANCREATOGRAPHY (ERCP) WITH PROPOFOL N/A 12/01/2022   Procedure: ENDOSCOPIC RETROGRADE CHOLANGIOPANCREATOGRAPHY (ERCP) WITH PROPOFOL;  Surgeon: [name] [name], MD;  Location: [facility];  Service: Gastroenterology;  Laterality: N/A;   ENDOSCOPIC RETROGRADE CHOLANGIOPANCREATOGRAPHY (ERCP) WITH PROPOFOL N/A 02/02/2023   Procedure:  ENDOSCOPIC RETROGRADE CHOLANGIOPANCREATOGRAPHY (ERCP) WITH PROPOFOL;  Surgeon: [name] [name], MD;  Location: [facility];  Service: Gastroenterology;  Laterality: N/A;   ESOPHAGOGASTRODUODENOSCOPY N/A 12/01/2022   Procedure: ESOPHAGOGASTRODUODENOSCOPY (EGD);  Surgeon: Lemar Lofty., MD;  Location: [facility];  Service: Gastroenterology;  Laterality: N/A;   ESOPHAGOGASTRODUODENOSCOPY (EGD) WITH PROPOFOL N/A 12/02/2021   Procedure: ESOPHAGOGASTRODUODENOSCOPY (EGD) WITH PROPOFOL;  Surgeon: [name] [name], MD;  Location: [facility];  Service: Gastroenterology;  Laterality: N/A;   FINE NEEDLE ASPIRATION N/A 12/01/2022   Procedure: FINE NEEDLE ASPIRATION (FNA) LINEAR;  Surgeon: Lemar Lofty., MD;  Location: [facility];  Service: Gastroenterology;  Laterality: N/A;   NEUROLYTIC CELIAC PLEXUS  12/01/2022   Procedure: CELIAC PLEXUS BLOCK;  Surgeon: [name] [name], MD;  Location: [facility];  Service: Gastroenterology;;   NO PAST SURGERIES     PANCREATIC STENT PLACEMENT  08/12/2022   Procedure: PANCREATIC STENT PLACEMENT;  Surgeon: Lemar Lofty., MD;  Location: [facility];  Service: Gastroenterology;;   PANCREATIC STENT PLACEMENT  12/01/2022   Procedure: PANCREATIC STENT PLACEMENT;  Surgeon: Lemar Lofty., MD;  Location: [facility];  Service: Gastroenterology;;   PANCREATIC STENT PLACEMENT  02/02/2023   Procedure: PANCREATIC STENT PLACEMENT;  Surgeon: Lemar Lofty., MD;  Location: [facility];  Service: Gastroenterology;;   REMOVAL OF STONES  12/01/2022   Procedure: REMOVAL OF STONES;  Surgeon: Lemar Lofty., MD;  Location: [facility];  Service: Gastroenterology;;   REMOVAL OF STONES  02/02/2023   Procedure: REMOVAL OF STONES;  Surgeon: Lemar Lofty., MD;  Location: [facility];  Service: Gastroenterology;;   [name]  08/12/2022   Procedure: [name];  Surgeon: Lemar Lofty., MD;   Location: [facility];  Service: Gastroenterology;;   [name]  12/01/2022   Procedure: SPHINCTEROTOMY;  Surgeon: Lemar Lofty., MD;  Location: [facility];  Service: Gastroenterology;;   Francine Graven REMOVAL  12/01/2022   Procedure: STENT REMOVAL;  Surgeon: Lemar Lofty., MD;  Location: [facility];  Service: Gastroenterology;;   Francine Graven REMOVAL  02/02/2023   Procedure: STENT REMOVAL;  Surgeon: Lemar Lofty., MD;  Location: [facility];  Service: Gastroenterology;;   UPPER ESOPHAGEAL ENDOSCOPIC ULTRASOUND (EUS) N/A 12/02/2021   Procedure: UPPER ESOPHAGEAL ENDOSCOPIC ULTRASOUND (EUS);  Surgeon: Lemar Lofty., MD;  Location: [facility];  Service: Gastroenterology;  Laterality: N/A;   UPPER ESOPHAGEAL ENDOSCOPIC ULTRASOUND (EUS) N/A 12/01/2022   Procedure: UPPER ESOPHAGEAL ENDOSCOPIC ULTRASOUND (EUS);  Surgeon: Lemar Lofty., MD;  Location: [facility];  Service: Gastroenterology;  Laterality: N/A;    Family History  Problem Relation Age of Onset   Hypertension Mother    Alcoholism Maternal Uncle    Diabetes Maternal Grandmother    Diabetes Maternal Grandfather    Diabetes Paternal Grandmother    Diabetes Paternal Grandfather    Colon cancer Neg Hx    Esophageal cancer Neg Hx    Inflammatory bowel disease Neg Hx    Liver disease Neg Hx    Pancreatic cancer Neg Hx    Rectal cancer Neg Hx    Stomach cancer Neg Hx     Social History:  reports that he has been smoking cigars. He started smoking about 20 years ago. He has never used smokeless tobacco. He reports that he does not currently use alcohol. He reports that he does not use drugs.  Allergies:  Allergies  Allergen Reactions   Other Anaphylaxis and Other (See Comments)    [name] - anaphylaxis    Medications: I have reviewed the patient's current medications.  Results for orders placed or performed during the hospital encounter of 04/22/23 (from the past 48 hour(s))  CBC  with Differential     Status: Abnormal   Collection Time: 04/22/23  3:09 PM  Result Value Ref Range   WBC 5.1 4.0 - 10.5 K/uL   RBC 4.03 (L) 4.22 - 5.81 MIL/uL   Hemoglobin 14.0 13.0 - 17.0 g/dL   HCT 29.5 62.1 - 30.8 %   MCV 105.0 (H) 80.0 - 100.0 fL   MCH 34.7 (H) 26.0 - 34.0 pg   MCHC 33.1 30.0 - 36.0 g/dL   RDW 65.7 84.6 - 96.2 %   Platelets 179 150 - 400 K/uL   nRBC 0.0 0.0 - 0.2 %   Neutrophils Relative % 70 %   Neutro Abs 3.6 1.7 - 7.7 K/uL   Lymphocytes Relative 12 %   Lymphs Abs 0.6 (L) 0.7 - 4.0 K/uL   Monocytes Relative 14 %   Monocytes Absolute 0.7 0.1 - 1.0 K/uL   Eosinophils Relative 3 %   Eosinophils Absolute 0.1 0.0 - 0.5 K/uL   Basophils Relative 1 %   Basophils Absolute 0.0 0.0 - 0.1 K/uL   Immature Granulocytes 0 %   Abs Immature Granulocytes 0.02 0.00 - 0.07 K/uL    Comment: Performed at [facility], [address], [place], [place] [address]  Comprehensive metabolic panel     Status: Abnormal   Collection Time: 04/22/23  3:09 PM  Result Value Ref Range   Sodium 133 (L) 135 - 145 mmol/L   Potassium 3.5 3.5 - 5.1 mmol/L   Chloride 100 98 - 111 mmol/L   CO2 21 (L) 22 - 32 mmol/L   Glucose, Bld 165 (H) 70 - 99 mg/dL  Comment: Glucose reference range applies only to samples taken after fasting for at least 8 hours.   BUN 13 6 - 20 mg/dL   Creatinine, Ser 1.61 0.61 - 1.24 mg/dL   Calcium 9.0 8.9 - 09.6 mg/dL   Total Protein 7.2 6.5 - 8.1 g/dL   Albumin 3.2 (L) 3.5 - 5.0 g/dL   AST 045 (H) 15 - 41 U/L   ALT 97 (H) 0 - 44 U/L   Alkaline Phosphatase 1,064 (H) 38 - 126 U/L   Total Bilirubin 5.8 (H) 0.3 - 1.2 mg/dL   GFR, Estimated >40 >98 mL/min    Comment: (NOTE) Calculated using the CKD-EPI Creatinine Equation (2021)    Anion gap 12 5 - 15    Comment: Performed at [facility], [address], [place], [place] [address]  Lipase, blood     Status: None   Collection Time: 04/22/23  3:09 PM  Result Value Ref Range    Lipase 19 11 - 51 U/L    Comment: Performed at [facility], [address] [place], [place] [address]  Urinalysis, Routine w reflex microscopic -Urine, Clean Catch     Status: Abnormal   Collection Time: 04/22/23  6:38 PM  Result Value Ref Range   Color, Urine AMBER (A) YELLOW    Comment: BIOCHEMICALS MAY BE AFFECTED BY COLOR   APPearance CLEAR CLEAR   Specific Gravity, Urine 1.025 1.005 - 1.030   pH 5.0 5.0 - 8.0   Glucose, UA >=500 (A) NEGATIVE mg/dL   Hgb urine dipstick NEGATIVE NEGATIVE   Bilirubin Urine MODERATE (A) NEGATIVE   Ketones, ur 20 (A) NEGATIVE mg/dL   Protein, ur 30 (A) NEGATIVE mg/dL   Nitrite NEGATIVE NEGATIVE   Leukocytes,Ua NEGATIVE NEGATIVE   RBC / HPF 0-5 0 - 5 RBC/hpf   WBC, UA 0-5 0 - 5 WBC/hpf   Bacteria, UA NONE SEEN NONE SEEN   Squamous Epithelial / HPF 0-5 0 - 5 /HPF   Mucus PRESENT     Comment: Performed at [facility], [address] [place], [place] [address]  CBG monitoring, ED     Status: Abnormal   Collection Time: 04/22/23  7:57 PM  Result Value Ref Range   Glucose-Capillary 121 (H) 70 - 99 mg/dL    Comment: Glucose reference range applies only to samples taken after fasting for at least 8 hours.  Comprehensive metabolic panel     Status: Abnormal   Collection Time: 04/23/23  3:03 AM  Result Value Ref Range   Sodium 132 (L) 135 - 145 mmol/L   Potassium 3.4 (L) 3.5 - 5.1 mmol/L   Chloride 98 98 - 111 mmol/L   CO2 24 22 - 32 mmol/L   Glucose, Bld 147 (H) 70 - 99 mg/dL    Comment: Glucose reference range applies only to samples taken after fasting for at least 8 hours.   BUN 9 6 - 20 mg/dL   Creatinine, Ser 8.65 0.61 - 1.24 mg/dL   Calcium 8.5 (L) 8.9 - 10.3 mg/dL   Total Protein 6.0 (L) 6.5 - 8.1 g/dL   Albumin 2.6 (L) 3.5 - 5.0 g/dL   AST 784 (H) 15 - 41 U/L   ALT 86 (H) 0 - 44 U/L   Alkaline Phosphatase 1,098 (H) 38 - 126 U/L   Total Bilirubin 5.9 (H) 0.3 - 1.2 mg/dL   GFR, Estimated >69 >62  mL/min    Comment: (NOTE) Calculated using the  CKD-EPI Creatinine Equation (2021)    Anion gap 10 5 - 15    Comment: Performed at [facility], [address] [place], [place] [address]  Magnesium     Status: Abnormal   Collection Time: 04/23/23  3:03 AM  Result Value Ref Range   Magnesium 1.5 (L) 1.7 - 2.4 mg/dL    Comment: Performed at [facility], [address] [place], [place] [address]  CBC     Status: Abnormal   Collection Time: 04/23/23  3:03 AM  Result Value Ref Range   WBC 5.4 4.0 - 10.5 K/uL   RBC 3.78 (L) 4.22 - 5.81 MIL/uL   Hemoglobin 13.0 13.0 - 17.0 g/dL   HCT 44.0 (L) 10.2 - 72.5 %   MCV 101.1 (H) 80.0 - 100.0 fL   MCH 34.4 (H) 26.0 - 34.0 pg   MCHC 34.0 30.0 - 36.0 g/dL   RDW 36.6 44.0 - 34.7 %   Platelets 162 150 - 400 K/uL   nRBC 0.0 0.0 - 0.2 %    Comment: Performed at [facility], [address], [place], [place] [address]  Glucose, capillary     Status: Abnormal   Collection Time: 04/23/23  5:26 AM  Result Value Ref Range   Glucose-Capillary 103 (H) 70 - 99 mg/dL    Comment: Glucose reference range applies only to samples taken after fasting for at least 8 hours.   Comment 1 Notify RN   Glucose, capillary     Status: Abnormal   Collection Time: 04/23/23  8:39 AM  Result Value Ref Range   Glucose-Capillary 133 (H) 70 - 99 mg/dL    Comment: Glucose reference range applies only to samples taken after fasting for at least 8 hours.  Glucose, capillary     Status: Abnormal   Collection Time: 04/23/23 12:13 PM  Result Value Ref Range   Glucose-Capillary 136 (H) 70 - 99 mg/dL    Comment: Glucose reference range applies only to samples taken after fasting for at least 8 hours.    US Abdomen Limited RUQ (LIVER/GB)  Result Date: 04/22/2023 CLINICAL DATA:  Right upper quadrant pain. EXAM: ULTRASOUND ABDOMEN LIMITED RIGHT UPPER QUADRANT COMPARISON:  CT 04/22/2023 FINDINGS: Gallbladder: Calcification along the nondependent edge of the  gallbladder likely represents layering stones but could indicate focal gallbladder wall calcification. Layering sludge in the gallbladder. Gallbladder wall is thickened at 4.6 mm. Murphy's sign is negative. Common bile duct: Diameter: 5 mm, normal Liver: Increased liver parenchymal echotexture suggesting fatty infiltration. No focal lesions. Portal vein is patent on color Doppler imaging with normal direction of blood flow towards the liver. Other: None. IMPRESSION: Gallbladder calcification, most likely gallstones but could indicate focal mural calcification. Layering sludge and wall thickening. Murphy's sign is negative. Changes are nonspecific for acute cholecystitis. Clinical correlation is suggested. Electronically Signed   By: Burman Nieves M.D.   On: 04/22/2023 20:15   CT ABDOMEN PELVIS W CONTRAST  Result Date: 04/22/2023 CLINICAL DATA:  Epigastric pain EXAM: CT ABDOMEN AND PELVIS WITH CONTRAST TECHNIQUE: Multidetector CT imaging of the abdomen and pelvis was performed using the standard protocol following bolus administration of intravenous contrast. RADIATION DOSE REDUCTION: This exam was performed according to the departmental dose-optimization program which includes automated exposure control, adjustment of the mA and/or kV according to patient size and/or use of iterative reconstruction technique. CONTRAST:  OMNIPAQUE IOHEXOL 300 MG/ML  SOLN COMPARISON:  CT of the abdomen without and  with contrast 02/03/2023. FINDINGS: Lower chest: The lung bases are clear without focal nodule, mass, or airspace disease. Heart size is normal. No significant pleural or pericardial effusion is present. Hepatobiliary: Mild fatty infiltration of the liver is again noted. No discrete lesions are present. Wall thickening is present about the gallbladder. Pancreas: Pancreatic duct stent is stable in position. Diffuse inflammatory changes are again noted about the head of the pancreas. No discrete cystic lesions are  present. The pancreas enhances throughout. Spleen: Normal in size without focal abnormality. Adrenals/Urinary Tract: The adrenal glands are normal bilaterally. Kidneys are unremarkable. No stone or mass lesion is present. Ureters are normal bilaterally. The urinary bladder is within normal limits. Stomach/Bowel: Mild inflammatory changes are present the distal stomach and duodenum, likely secondary to the pancreatic inflammation. The small bowel is otherwise unremarkable. Terminal ileum is within normal limits. The appendix is not discretely visualized and may be surgically absent. The ascending and transverse colon are within normal limits. Descending and sigmoid colon are normal. Vascular/Lymphatic: No significant vascular findings are present. No enlarged abdominal or pelvic lymph nodes. Reproductive: Prostate is unremarkable. Other: A small amount of free fluid is present within the anatomic pelvis. No significant ventral hernia is present. Musculoskeletal: The vertebral body heights and alignment are normal. No focal osseous lesions are present. The hips are located. IMPRESSION: 1. Diffuse inflammatory changes about the head of the pancreas compatible with acute pancreatitis. No complicating features. 2. Pancreatic duct stent is stable in position. 3. Hepatic steatosis. 4. Small amount of free fluid within the anatomic pelvis is likely reactive. Electronically Signed   By: Marin Roberts M.D.   On: 04/22/2023 17:35    ROS 10 point review of systems is negative except as listed above in HPI.   Physical Exam Blood pressure (!) 144/100, pulse 75, temperature 98.3 F (36.8 C), temperature source Oral, resp. rate 17, height 6\' 5"  (1.956 m), weight 79.4 kg, SpO2 100 %. Constitutional: well-developed, well-nourished HEENT: pupils equal, round, reactive to light, 2mm b/l, moist conjunctiva, external inspection of ears and nose normal, hearing intact Oropharynx: normal oropharyngeal mucosa, normal  dentition Neck: no thyromegaly, trachea midline, no midline cervical tenderness to palpation Chest: breath sounds equal bilaterally, normal respiratory effort, no midline or lateral chest wall tenderness to palpation/deformity Abdomen: soft, epigastric and RUQ TTP, no bruising, no hepatosplenomegaly  Back: no wounds, no thoracic/lumbar spine tenderness to palpation, no thoracic/lumbar spine stepoffs Rectal: deferred Extremities: 2+ radial and pedal pulses bilaterally, intact motor and sensation bilateral UE and LE, no peripheral edema MSK: unable to assess gait/station, no clubbing/cyanosis of fingers/toes, normal ROM of all four extremities Skin: warm, dry, no rashes Psych: normal memory, normal mood/affect     Assessment/Plan: 45M with chronic pancreatitis, abdominal pain, transaminitis and hyperbilirubinemia. WBC is normal, question primary cholecystitis vs secondary cholecystitis vs no cholecystitis. Hyperbili is also concerning for CBD obstruction, though no CBD dilation on CT. May potentially have secondary inflammation/edema of the CBD causing functional obstruction. I have discussed this case with both Dr. Elnoria Howard (GI) and Dr. Deanne Coffer (IR). Patient may benefit from cholecystostomy tube for decompression and ability to perform tube cholangiogram to confirm/exclude CBD obstruction. Prior to procedural intervention, HIDA may be beneficial to aid in diagnosis. Patient is currently non-toxic in appearance. Plan for HIDA 6/17 unless clinical decline.   Carlos Monks, MD General and Trauma Surgery [facility]

## 2023-04-23 NOTE — Consult Note (Signed)
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  The patient started to experience RUQ pain 2 weeks ago, but his symptoms worsened 3 days ago.  He noticed that his urine turned orange and he presented to the ER.  Work up in the ER showed that his liver enzymes were markedly abnormal:  AST 167, ALT 97, AP 1064, and TB 5.8.  Imaging shows that there is evidence of pancreatitis.  The patient denies any ETOH use of late.  On 02/05/2023 his liver enzymes were normal.  The last time that he had elevations in his liver enzymes were 12/2021, but there was no clear biliary source.  The current imaging does not suggest any biliary ductal dilation, but there was suggestion of gallbladder wall thickening.  A Surgical consultation is pending.   Past Medical History:  Diagnosis Date   AKI (acute kidney injury) (HCC) 12/21/2021   Anxiety    Diabetes (HCC)    Pancreatitis    Splenic vein thrombosis     Past Surgical History:  Procedure Laterality Date   BILIARY BRUSHING  02/02/2023   Procedure: BILIARY BRUSHING;  Surgeon: [name] [name], MD;  Location: [facility];  Service: Gastroenterology;;   BILIARY DILATION  12/01/2022   Procedure: BILIARY DILATION;  Surgeon: Lemar Lofty., MD;  Location: [facility];  Service: Gastroenterology;;   BILIARY DILATION  02/02/2023   Procedure: BILIARY DILATION;  Surgeon: Lemar Lofty., MD;  Location: [facility];  Service: Gastroenterology;;   BIOPSY  12/02/2021   Procedure: BIOPSY;  Surgeon: Lemar Lofty., MD;  Location: [facility];  Service: Gastroenterology;;   ENDOSCOPIC RETROGRADE CHOLANGIOPANCREATOGRAPHY (ERCP) WITH  PROPOFOL N/A 08/12/2022   Procedure: ENDOSCOPIC RETROGRADE CHOLANGIOPANCREATOGRAPHY (ERCP) WITH PROPOFOL;  Surgeon: Lemar Lofty., MD;  Location: [facility];  Service: Gastroenterology;  Laterality: N/A;   ENDOSCOPIC RETROGRADE CHOLANGIOPANCREATOGRAPHY (ERCP) WITH PROPOFOL N/A 12/01/2022   Procedure: ENDOSCOPIC RETROGRADE CHOLANGIOPANCREATOGRAPHY (ERCP) WITH PROPOFOL;  Surgeon: [name] [name], MD;  Location: [facility];  Service: Gastroenterology;  Laterality: N/A;   ENDOSCOPIC RETROGRADE CHOLANGIOPANCREATOGRAPHY (ERCP) WITH PROPOFOL N/A 02/02/2023   Procedure: ENDOSCOPIC RETROGRADE CHOLANGIOPANCREATOGRAPHY (ERCP) WITH PROPOFOL;  Surgeon: [name] [name], MD;  Location: [facility];  Service: Gastroenterology;  Laterality: N/A;   ESOPHAGOGASTRODUODENOSCOPY N/A 12/01/2022   Procedure: ESOPHAGOGASTRODUODENOSCOPY (EGD);  Surgeon: Lemar Lofty., MD;  Location: [facility];  Service: Gastroenterology;  Laterality: N/A;   ESOPHAGOGASTRODUODENOSCOPY (EGD) WITH PROPOFOL N/A 12/02/2021   Procedure: ESOPHAGOGASTRODUODENOSCOPY (EGD) WITH PROPOFOL;  Surgeon: [name] [name], MD;  Location: [facility];  Service: Gastroenterology;  Laterality: N/A;   FINE NEEDLE ASPIRATION N/A 12/01/2022   Procedure: FINE NEEDLE ASPIRATION (FNA) LINEAR;  Surgeon: Lemar Lofty., MD;  Location: [facility];  Service: Gastroenterology;  Laterality: N/A;   NEUROLYTIC CELIAC PLEXUS  12/01/2022   Procedure: CELIAC PLEXUS BLOCK;  Surgeon: [name] [name], MD;  Location: [facility];  Service: Gastroenterology;;   NO PAST SURGERIES     PANCREATIC STENT PLACEMENT  08/12/2022   Procedure: PANCREATIC STENT PLACEMENT;  Surgeon: Lemar Lofty., MD;  Location: [facility];  Service: Gastroenterology;;   PANCREATIC STENT PLACEMENT  12/01/2022   Procedure: PANCREATIC STENT PLACEMENT;  Surgeon: Lemar Lofty., MD;  Location: [facility];  Service:  Gastroenterology;;   PANCREATIC STENT PLACEMENT  02/02/2023   Procedure: PANCREATIC STENT PLACEMENT;  Surgeon: [name] [name], MD;  Location: [facility];  Service: Gastroenterology;;   REMOVAL OF STONES  12/01/2022   Procedure: REMOVAL OF STONES;  Surgeon: Lemar Lofty., MD;  Location: [facility];  Service: Gastroenterology;;   REMOVAL OF STONES  02/02/2023   Procedure: REMOVAL OF STONES;  Surgeon: Lemar Lofty., MD;  Location: [facility];  Service: Gastroenterology;;   [name]  08/12/2022   Procedure: [name];  Surgeon: Lemar Lofty., MD;  Location: [facility];  Service: Gastroenterology;;   [name]  12/01/2022   Procedure: [name];  Surgeon: Lemar Lofty., MD;  Location: [facility];  Service: Gastroenterology;;   Francine Graven REMOVAL  12/01/2022   Procedure: STENT REMOVAL;  Surgeon: Lemar Lofty., MD;  Location: [facility];  Service: Gastroenterology;;   Francine Graven REMOVAL  02/02/2023   Procedure: STENT REMOVAL;  Surgeon: Lemar Lofty., MD;  Location: [facility];  Service: Gastroenterology;;   UPPER ESOPHAGEAL ENDOSCOPIC ULTRASOUND (EUS) N/A 12/02/2021   Procedure: UPPER ESOPHAGEAL ENDOSCOPIC ULTRASOUND (EUS);  Surgeon: Lemar Lofty., MD;  Location: [facility];  Service: Gastroenterology;  Laterality: N/A;   UPPER ESOPHAGEAL ENDOSCOPIC ULTRASOUND (EUS) N/A 12/01/2022   Procedure: UPPER ESOPHAGEAL ENDOSCOPIC ULTRASOUND (EUS);  Surgeon: Lemar Lofty., MD;  Location: [facility];  Service: Gastroenterology;  Laterality: N/A;    Family History  Problem Relation Age of Onset   Hypertension Mother    Alcoholism Maternal Uncle    Diabetes Maternal Grandmother    Diabetes Maternal Grandfather    Diabetes Paternal Grandmother    Diabetes Paternal Grandfather    Colon cancer Neg Hx    Esophageal cancer Neg Hx    Inflammatory bowel disease Neg Hx    Liver disease Neg Hx     Pancreatic cancer Neg Hx    Rectal cancer Neg Hx    Stomach cancer Neg Hx     Social History:  reports that he has been smoking cigars. He started smoking about 20 years ago. He has never used smokeless tobacco. He reports that he does not currently use alcohol. He reports that he does not use drugs.  Allergies:  Allergies  Allergen Reactions   Other Anaphylaxis and Other (See Comments)    [name] - anaphylaxis    Medications: Scheduled:  insulin aspart  0-9 Units Subcutaneous Q4H   insulin glargine-yfgn  5 Units Subcutaneous QHS   pantoprazole  40 mg Oral Daily   Continuous:  ampicillin-sulbactam (UNASYN) IV     lactated ringers 125 mL/hr at 04/23/23 0200   potassium chloride      Results for orders placed or performed during the hospital encounter of 04/22/23 (from the past 24 hour(s))  CBC with Differential     Status: Abnormal   Collection Time: 04/22/23  3:09 PM  Result Value Ref Range   WBC 5.1 4.0 - 10.5 K/uL   RBC 4.03 (L) 4.22 - 5.81 MIL/uL   Hemoglobin 14.0 13.0 - 17.0 g/dL   HCT 16.1 09.6 - 04.5 %   MCV 105.0 (H) 80.0 - 100.0 fL   MCH 34.7 (H) 26.0 - 34.0 pg   MCHC 33.1 30.0 - 36.0 g/dL   RDW 40.9 81.1 - 91.4 %   Platelets 179 150 - 400 K/uL   nRBC 0.0 0.0 - 0.2 %   Neutrophils Relative % 70 %   Neutro Abs 3.6 1.7 - 7.7 K/uL  Lymphocytes Relative 12 %   Lymphs Abs 0.6 (L) 0.7 - 4.0 K/uL   Monocytes Relative 14 %   Monocytes Absolute 0.7 0.1 - 1.0 K/uL   Eosinophils Relative 3 %   Eosinophils Absolute 0.1 0.0 - 0.5 K/uL   Basophils Relative 1 %   Basophils Absolute 0.0 0.0 - 0.1 K/uL   Immature Granulocytes 0 %   Abs Immature Granulocytes 0.02 0.00 - 0.07 K/uL  Comprehensive metabolic panel     Status: Abnormal   Collection Time: 04/22/23  3:09 PM  Result Value Ref Range   Sodium 133 (L) 135 - 145 mmol/L   Potassium 3.5 3.5 - 5.1 mmol/L   Chloride 100 98 - 111 mmol/L   CO2 21 (L) 22 - 32 mmol/L   Glucose, Bld 165 (H) 70 - 99 mg/dL   BUN 13 6 -  20 mg/dL   Creatinine, Ser 4.09 0.61 - 1.24 mg/dL   Calcium 9.0 8.9 - 81.1 mg/dL   Total Protein 7.2 6.5 - 8.1 g/dL   Albumin 3.2 (L) 3.5 - 5.0 g/dL   AST 914 (H) 15 - 41 U/L   ALT 97 (H) 0 - 44 U/L   Alkaline Phosphatase 1,064 (H) 38 - 126 U/L   Total Bilirubin 5.8 (H) 0.3 - 1.2 mg/dL   GFR, Estimated >78 >29 mL/min   Anion gap 12 5 - 15  Lipase, blood     Status: None   Collection Time: 04/22/23  3:09 PM  Result Value Ref Range   Lipase 19 11 - 51 U/L  Urinalysis, Routine w reflex microscopic -Urine, Clean Catch     Status: Abnormal   Collection Time: 04/22/23  6:38 PM  Result Value Ref Range   Color, Urine AMBER (A) YELLOW   APPearance CLEAR CLEAR   Specific Gravity, Urine 1.025 1.005 - 1.030   pH 5.0 5.0 - 8.0   Glucose, UA >=500 (A) NEGATIVE mg/dL   Hgb urine dipstick NEGATIVE NEGATIVE   Bilirubin Urine MODERATE (A) NEGATIVE   Ketones, ur 20 (A) NEGATIVE mg/dL   Protein, ur 30 (A) NEGATIVE mg/dL   Nitrite NEGATIVE NEGATIVE   Leukocytes,Ua NEGATIVE NEGATIVE   RBC / HPF 0-5 0 - 5 RBC/hpf   WBC, UA 0-5 0 - 5 WBC/hpf   Bacteria, UA NONE SEEN NONE SEEN   Squamous Epithelial / HPF 0-5 0 - 5 /HPF   Mucus PRESENT   CBG monitoring, ED     Status: Abnormal   Collection Time: 04/22/23  7:57 PM  Result Value Ref Range   Glucose-Capillary 121 (H) 70 - 99 mg/dL  Comprehensive metabolic panel     Status: Abnormal   Collection Time: 04/23/23  3:03 AM  Result Value Ref Range   Sodium 132 (L) 135 - 145 mmol/L   Potassium 3.4 (L) 3.5 - 5.1 mmol/L   Chloride 98 98 - 111 mmol/L   CO2 24 22 - 32 mmol/L   Glucose, Bld 147 (H) 70 - 99 mg/dL   BUN 9 6 - 20 mg/dL   Creatinine, Ser 5.62 0.61 - 1.24 mg/dL   Calcium 8.5 (L) 8.9 - 10.3 mg/dL   Total Protein 6.0 (L) 6.5 - 8.1 g/dL   Albumin 2.6 (L) 3.5 - 5.0 g/dL   AST 130 (H) 15 - 41 U/L   ALT 86 (H) 0 - 44 U/L   Alkaline Phosphatase 1,098 (H) 38 - 126 U/L   Total Bilirubin 5.9 (H) 0.3 - 1.2 mg/dL  GFR, Estimated >60 >60 mL/min    Anion gap 10 5 - 15  Magnesium     Status: Abnormal   Collection Time: 04/23/23  3:03 AM  Result Value Ref Range   Magnesium 1.5 (L) 1.7 - 2.4 mg/dL  CBC     Status: Abnormal   Collection Time: 04/23/23  3:03 AM  Result Value Ref Range   WBC 5.4 4.0 - 10.5 K/uL   RBC 3.78 (L) 4.22 - 5.81 MIL/uL   Hemoglobin 13.0 13.0 - 17.0 g/dL   HCT 44.0 (L) 10.2 - 72.5 %   MCV 101.1 (H) 80.0 - 100.0 fL   MCH 34.4 (H) 26.0 - 34.0 pg   MCHC 34.0 30.0 - 36.0 g/dL   RDW 36.6 44.0 - 34.7 %   Platelets 162 150 - 400 K/uL   nRBC 0.0 0.0 - 0.2 %  Glucose, capillary     Status: Abnormal   Collection Time: 04/23/23  5:26 AM  Result Value Ref Range   Glucose-Capillary 103 (H) 70 - 99 mg/dL   Comment 1 Notify RN      US Abdomen Limited RUQ (LIVER/GB)  Result Date: 04/22/2023 CLINICAL DATA:  Right upper quadrant pain. EXAM: ULTRASOUND ABDOMEN LIMITED RIGHT UPPER QUADRANT COMPARISON:  CT 04/22/2023 FINDINGS: Gallbladder: Calcification along the nondependent edge of the gallbladder likely represents layering stones but could indicate focal gallbladder wall calcification. Layering sludge in the gallbladder. Gallbladder wall is thickened at 4.6 mm. Murphy's sign is negative. Common bile duct: Diameter: 5 mm, normal Liver: Increased liver parenchymal echotexture suggesting fatty infiltration. No focal lesions. Portal vein is patent on color Doppler imaging with normal direction of blood flow towards the liver. Other: None. IMPRESSION: Gallbladder calcification, most likely gallstones but could indicate focal mural calcification. Layering sludge and wall thickening. Murphy's sign is negative. Changes are nonspecific for acute cholecystitis. Clinical correlation is suggested. Electronically Signed   By: Burman Nieves M.D.   On: 04/22/2023 20:15   CT ABDOMEN PELVIS W CONTRAST  Result Date: 04/22/2023 CLINICAL DATA:  Epigastric pain EXAM: CT ABDOMEN AND PELVIS WITH CONTRAST TECHNIQUE: Multidetector CT imaging of the  abdomen and pelvis was performed using the standard protocol following bolus administration of intravenous contrast. RADIATION DOSE REDUCTION: This exam was performed according to the departmental dose-optimization program which includes automated exposure control, adjustment of the mA and/or kV according to patient size and/or use of iterative reconstruction technique. CONTRAST:  OMNIPAQUE IOHEXOL 300 MG/ML  SOLN COMPARISON:  CT of the abdomen without and with contrast 02/03/2023. FINDINGS: Lower chest: The lung bases are clear without focal nodule, mass, or airspace disease. Heart size is normal. No significant pleural or pericardial effusion is present. Hepatobiliary: Mild fatty infiltration of the liver is again noted. No discrete lesions are present. Wall thickening is present about the gallbladder. Pancreas: Pancreatic duct stent is stable in position. Diffuse inflammatory changes are again noted about the head of the pancreas. No discrete cystic lesions are present. The pancreas enhances throughout. Spleen: Normal in size without focal abnormality. Adrenals/Urinary Tract: The adrenal glands are normal bilaterally. Kidneys are unremarkable. No stone or mass lesion is present. Ureters are normal bilaterally. The urinary bladder is within normal limits. Stomach/Bowel: Mild inflammatory changes are present the distal stomach and duodenum, likely secondary to the pancreatic inflammation. The small bowel is otherwise unremarkable. Terminal ileum is within normal limits. The appendix is not discretely visualized and may be surgically absent. The ascending and transverse colon are within normal limits.  Descending and sigmoid colon are normal. Vascular/Lymphatic: No significant vascular findings are present. No enlarged abdominal or pelvic lymph nodes. Reproductive: Prostate is unremarkable. Other: A small amount of free fluid is present within the anatomic pelvis. No significant ventral hernia is present.  Musculoskeletal: The vertebral body heights and alignment are normal. No focal osseous lesions are present. The hips are located. IMPRESSION: 1. Diffuse inflammatory changes about the head of the pancreas compatible with acute pancreatitis. No complicating features. 2. Pancreatic duct stent is stable in position. 3. Hepatic steatosis. 4. Small amount of free fluid within the anatomic pelvis is likely reactive. Electronically Signed   By: Marin Roberts M.D.   On: 04/22/2023 17:35    ROS:  As stated above in the HPI otherwise negative.  Blood pressure 133/85, pulse 77, temperature 97.8 F (36.6 C), temperature source Oral, resp. rate 18, height 6\' 5"  (1.956 m), weight 79.4 kg, SpO2 100 %.    PE: Gen: Uncomfortable, Alert and Oriented HEENT:  Funk/AT, EOMI Neck: Supple, no LAD Lungs: CTA Bilaterally CV: RRR without M/G/R ABD: Soft, RUQ tenderness, +BS Ext: No C/C/E  Assessment/Plan: 1) RUQ tenderness. 2) Distended gallbladder. 3) Acute on chronic pancreatitis. 4) Abnormal liver enzymes.   Clinically and with imaging, his gallbladder appears to be the problem.  It is markedly distended and there is thickening.  The pancreatic stent is in position, but there is evidence of inflammation.  The lipase was normal.  With his pancreatitis exacerbations the pain typically subsides with pain medication, but his pain continues.  With such a high AP elevation there is the possibility of an infectious source, but his WBC is normal.  Plan: 1) Await Surgical consultation. 2) ? HIDA. 3) Supportive care.     Kendahl Bumgardner D 04/23/2023, 7:28 AM

## 2023-04-23 NOTE — Plan of Care (Signed)
Patient AOX4, VSS throughout shift.  Elevated BP, pt remains asymptomatic.  All meds given on time as ordered.  PRN oxycodone and IV dilaudid given for abd pain relief.  Diminished lungs, IS taught and encouraged.  Pt ambulated with steady gait.  Remains NPO.  POC maintained, Will continue to monitor.  Problem: Education: Goal: Ability to describe self-care measures that may prevent or decrease complications (Diabetes Survival Skills Education) will improve Outcome: Progressing Goal: Individualized Educational Video(s) Outcome: Progressing   Problem: Coping: Goal: Ability to adjust to condition or change in health will improve Outcome: Progressing   Problem: Fluid Volume: Goal: Ability to maintain a balanced intake and output will improve Outcome: Progressing   Problem: Health Behavior/Discharge Planning: Goal: Ability to identify and utilize available resources and services will improve Outcome: Progressing Goal: Ability to manage health-related needs will improve Outcome: Progressing   Problem: Metabolic: Goal: Ability to maintain appropriate glucose levels will improve Outcome: Progressing   Problem: Nutritional: Goal: Maintenance of adequate nutrition will improve Outcome: Progressing Goal: Progress toward achieving an optimal weight will improve Outcome: Progressing   Problem: Skin Integrity: Goal: Risk for impaired skin integrity will decrease Outcome: Progressing   Problem: Tissue Perfusion: Goal: Adequacy of tissue perfusion will improve Outcome: Progressing   Problem: Education: Goal: Knowledge of General Education information will improve Description: Including pain rating scale, medication(s)/side effects and non-pharmacologic comfort measures Outcome: Progressing   Problem: Health Behavior/Discharge Planning: Goal: Ability to manage health-related needs will improve Outcome: Progressing   Problem: Clinical Measurements: Goal: Ability to maintain clinical  measurements within normal limits will improve Outcome: Progressing Goal: Will remain free from infection Outcome: Progressing Goal: Diagnostic test results will improve Outcome: Progressing Goal: Respiratory complications will improve Outcome: Progressing Goal: Cardiovascular complication will be avoided Outcome: Progressing   Problem: Activity: Goal: Risk for activity intolerance will decrease Outcome: Progressing   Problem: Nutrition: Goal: Adequate nutrition will be maintained Outcome: Progressing   Problem: Coping: Goal: Level of anxiety will decrease Outcome: Progressing   Problem: Elimination: Goal: Will not experience complications related to bowel motility Outcome: Progressing Goal: Will not experience complications related to urinary retention Outcome: Progressing   Problem: Pain Managment: Goal: General experience of comfort will improve Outcome: Progressing   Problem: Safety: Goal: Ability to remain free from injury will improve Outcome: Progressing   Problem: Skin Integrity: Goal: Risk for impaired skin integrity will decrease Outcome: Progressing

## 2023-04-24 DIAGNOSIS — K859 Acute pancreatitis without necrosis or infection, unspecified: Secondary | ICD-10-CM | POA: Diagnosis not present

## 2023-04-24 DIAGNOSIS — K861 Other chronic pancreatitis: Secondary | ICD-10-CM | POA: Diagnosis not present

## 2023-04-24 LAB — COMPREHENSIVE METABOLIC PANEL
ALT: 95 U/L — ABNORMAL HIGH (ref 0–44)
AST: 240 U/L — ABNORMAL HIGH (ref 15–41)
Albumin: 2.3 g/dL — ABNORMAL LOW (ref 3.5–5.0)
Alkaline Phosphatase: 1213 U/L — ABNORMAL HIGH (ref 38–126)
Anion gap: 14 (ref 5–15)
BUN: 6 mg/dL (ref 6–20)
CO2: 19 mmol/L — ABNORMAL LOW (ref 22–32)
Calcium: 8.1 mg/dL — ABNORMAL LOW (ref 8.9–10.3)
Chloride: 97 mmol/L — ABNORMAL LOW (ref 98–111)
Creatinine, Ser: 0.84 mg/dL (ref 0.61–1.24)
GFR, Estimated: >60 mL/min (ref >60)
Glucose, Bld: 141 mg/dL — ABNORMAL HIGH (ref 70–99)
Potassium: 3.6 mmol/L (ref 3.5–5.1)
Sodium: 130 mmol/L — ABNORMAL LOW (ref 135–145)
Total Bilirubin: 9.4 mg/dL — ABNORMAL HIGH (ref 0.3–1.2)
Total Protein: 5.8 g/dL — ABNORMAL LOW (ref 6.5–8.1)

## 2023-04-24 LAB — CBC
HCT: 37.8 % — ABNORMAL LOW (ref 39.0–52.0)
Hemoglobin: 12.9 g/dL — ABNORMAL LOW (ref 13.0–17.0)
MCH: 34.2 pg — ABNORMAL HIGH (ref 26.0–34.0)
MCHC: 34.1 g/dL (ref 30.0–36.0)
MCV: 100.3 fL — ABNORMAL HIGH (ref 80.0–100.0)
Platelets: 152 10*3/uL (ref 150–400)
RBC: 3.77 MIL/uL — ABNORMAL LOW (ref 4.22–5.81)
RDW: 12.7 % (ref 11.5–15.5)
WBC: 5.8 10*3/uL (ref 4.0–10.5)
nRBC: 0 % (ref 0.0–0.2)

## 2023-04-24 LAB — GLUCOSE, CAPILLARY
Glucose-Capillary: 142 mg/dL — ABNORMAL HIGH (ref 70–99)
Glucose-Capillary: 146 mg/dL — ABNORMAL HIGH (ref 70–99)
Glucose-Capillary: 160 mg/dL — ABNORMAL HIGH (ref 70–99)
Glucose-Capillary: 170 mg/dL — ABNORMAL HIGH (ref 70–99)
Glucose-Capillary: 175 mg/dL — ABNORMAL HIGH (ref 70–99)
Glucose-Capillary: 181 mg/dL — ABNORMAL HIGH (ref 70–99)

## 2023-04-24 MED ORDER — LACTATED RINGERS IV SOLN: INTRAVENOUS | Status: AC

## 2023-04-24 NOTE — Progress Notes (Signed)
PROGRESS NOTE    Carlos [name]  ZOX:[id number] DOB: 22-Jun-1986 DOA: 04/22/2023 PCP: [name], MD   Brief Narrative: 37 year old with past medical history significant for alcoholism and in remission, insulin-dependent diabetes, chronic pancreatitis, pancreatic duct stricture with a stent, history of splenic vein thrombosis presented with severe upper abdominal pain, nausea and vomiting.  He report right upper quadrant abdominal pain for the last 2 weeks worse over the last 72 hours, associated with nausea and vomiting.  He was found to have significant elevation of alkaline phosphatase 1064, transaminases AST 167 ALT 97, bilirubin 5.8, lipase 19.  CT abdomen and pelvis: Showed diffuse inflammatory changes about the head of the pancreas, compatible with acute pancreatitis.  Pancreatic duct stent is in a stable position.  Hepatic asteatosis.  A small amount of free fluid within the anatomic pelvis.  Right upper quadrant ultrasound showed gallbladder calcification, most likely gallstone, layering sludge and wall thickening.  Changes are nonspecific for acute cholecystitis.   Surgery and GI following, recommendation for HIDA scan and might need perc cholecystostomy tube.   Assessment & Plan:   Principal Problem:   Acute on chronic pancreatitis (HCC) Active Problems:   Uncontrolled type 1 diabetes mellitus with hyperglycemia, with long-term current use of insulin (HCC)   Elevated LFTs   Stricture of pancreatic duct  1-Acute on chronic pancreatitis, pancreatic ductal stricture, transaminases, Hyperbilirubinemia:  Concern for Cholecystitis:  CT abdomen Pelvis;  Diffuse inflammatory changes about the head of the pancreas compatible with acute pancreatitis. No complicating features. Pancreatic duct stent is stable in position. Hepatic steatosis. -RUQ [place]: Gallbladder calcification, most likely gallstones but could indicate focal mural calcification. Layering sludge and wall thickening. Changes  are nonspecific for acute cholecystitis -Continue with  IV Unasyn.  -Continue with IV dilaudid PRN 2 Q hours.  -PRN Zofran.  -GI consulted, recommend surgery evaluation.  -Dr Bedelia Person consulted. Plan for HIDA scan tomorrow, might need cholecystostomy tube placement.  -IV fluids. IV PPI.   Insulin-dependent diabetes type 2: On low dose insulin, insulin dependent. Monitor CBG SSI.   Hyponatremia: IV fluids.   Hypokalemia; Replaced IV KCL.      Estimated body mass index is 19.69 kg/m as calculated from the following:   Height as of this encounter: 6\' 5"  (1.956 m).   Weight as of this encounter: 75.3 kg.   DVT prophylaxis: SCD Code Status: Full code Family Communication:Care discussed with patient.  Disposition Plan:  Status is: Inpatient Remains inpatient appropriate because: management of Transaminases, abdominal pain.     Consultants:  GI General Surgery   Procedures:  RUQ [place]; Gallbladder calcification, most likely gallstones but could indicate focal mural calcification. Layering sludge and wall thickening. Murphy's sign is negative. Changes are nonspecific for acute cholecystitis. Clinical correlation is suggested.    Antimicrobials:  IV Unasyn.  Subjective: Continue to have abdominal pain, not better, report nausea. No further vomiting   Objective: Vitals:   04/24/23 0014 04/24/23 0442 04/24/23 0500 04/24/23 0820  BP: 128/82 125/82  120/74  Pulse: 79 84  74  Resp: 15 15    Temp: 98.7 F (37.1 C) 98.3 F (36.8 C)  98.8 F (37.1 C)  TempSrc: Oral Oral  Oral  SpO2: 100% 99%  100%  Weight:   75.3 kg   Height:        Intake/Output Summary (Last 24 hours) at 04/24/2023 1114 Last data filed at 04/24/2023 0441 Gross per 24 hour  Intake 1336.63 ml  Output --  Net 1336.63  ml    Filed Weights   04/22/23 1435 04/24/23 0500  Weight: 79.4 kg 75.3 kg    Examination:  General exam: NAD Respiratory system: CTA Cardiovascular system: S 1, S 2  RRR Gastrointestinal system: BS present, soft, tender, distended.  Central nervous system: Alert, oriented.  Extremities: Symmetric power.    Data Reviewed: I have personally reviewed following labs and imaging studies  CBC: Recent Labs  Lab 04/22/23 1509 04/23/23 0303 04/24/23 0438  WBC 5.1 5.4 5.8  NEUTROABS 3.6  --   --   HGB 14.0 13.0 12.9*  HCT 42.3 38.2* 37.8*  MCV 105.0* 101.1* 100.3*  PLT 179 162 152    Basic Metabolic Panel: Recent Labs  Lab 04/22/23 1509 04/23/23 0303 04/24/23 0438  NA 133* 132* 130*  K 3.5 3.4* 3.6  CL 100 98 97*  CO2 21* 24 19*  GLUCOSE 165* 147* 141*  BUN 13 9 6   CREATININE 0.74 0.81 0.84  CALCIUM 9.0 8.5* 8.1*  MG  --  1.5*  --     GFR: Estimated Creatinine Clearance: 128.2 mL/min (by C-G formula based on SCr of 0.84 mg/dL). Liver Function Tests: Recent Labs  Lab 04/22/23 1509 04/23/23 0303 04/24/23 0438  AST 167* 146* 240*  ALT 97* 86* 95*  ALKPHOS 1,064* 1,098* 1,213*  BILITOT 5.8* 5.9* 9.4*  PROT 7.2 6.0* 5.8*  ALBUMIN 3.2* 2.6* 2.3*    Recent Labs  Lab 04/22/23 1509  LIPASE 19    No results for input(s): "AMMONIA" in the last 168 hours. Coagulation Profile: No results for input(s): "INR", "PROTIME" in the last 168 hours. Cardiac Enzymes: No results for input(s): "CKTOTAL", "CKMB", "CKMBINDEX", "TROPONINI" in the last 168 hours. BNP (last 3 results) No results for input(s): "PROBNP" in the last 8760 hours. HbA1C: No results for input(s): "HGBA1C" in the last 72 hours. CBG: Recent Labs  Lab 04/23/23 1656 04/23/23 2031 04/24/23 0023 04/24/23 0441 04/24/23 0755  GLUCAP 176* 174* 181* 142* 146*    Lipid Profile: No results for input(s): "CHOL", "HDL", "LDLCALC", "TRIG", "CHOLHDL", "LDLDIRECT" in the last 72 hours. Thyroid Function Tests: No results for input(s): "TSH", "T4TOTAL", "FREET4", "T3FREE", "THYROIDAB" in the last 72 hours. Anemia Panel: No results for input(s): "VITAMINB12", "FOLATE",  "FERRITIN", "TIBC", "IRON", "RETICCTPCT" in the last 72 hours. Sepsis Labs: No results for input(s): "PROCALCITON", "LATICACIDVEN" in the last 168 hours.  No results found for this or any previous visit (from the past 240 hour(s)).       Radiology Studies: US Abdomen Limited RUQ (LIVER/GB)  Result Date: 04/22/2023 CLINICAL DATA:  Right upper quadrant pain. EXAM: ULTRASOUND ABDOMEN LIMITED RIGHT UPPER QUADRANT COMPARISON:  CT 04/22/2023 FINDINGS: Gallbladder: Calcification along the nondependent edge of the gallbladder likely represents layering stones but could indicate focal gallbladder wall calcification. Layering sludge in the gallbladder. Gallbladder wall is thickened at 4.6 mm. Murphy's sign is negative. Common bile duct: Diameter: 5 mm, normal Liver: Increased liver parenchymal echotexture suggesting fatty infiltration. No focal lesions. Portal vein is patent on color Doppler imaging with normal direction of blood flow towards the liver. Other: None. IMPRESSION: Gallbladder calcification, most likely gallstones but could indicate focal mural calcification. Layering sludge and wall thickening. Murphy's sign is negative. Changes are nonspecific for acute cholecystitis. Clinical correlation is suggested. Electronically Signed   By: Burman Nieves M.D.   On: 04/22/2023 20:15   CT ABDOMEN PELVIS W CONTRAST  Result Date: 04/22/2023 CLINICAL DATA:  Epigastric pain EXAM: CT ABDOMEN AND PELVIS WITH CONTRAST  TECHNIQUE: Multidetector CT imaging of the abdomen and pelvis was performed using the standard protocol following bolus administration of intravenous contrast. RADIATION DOSE REDUCTION: This exam was performed according to the departmental dose-optimization program which includes automated exposure control, adjustment of the mA and/or kV according to patient size and/or use of iterative reconstruction technique. CONTRAST:  OMNIPAQUE IOHEXOL 300 MG/ML  SOLN COMPARISON:  CT of the abdomen  without and with contrast 02/03/2023. FINDINGS: Lower chest: The lung bases are clear without focal nodule, mass, or airspace disease. Heart size is normal. No significant pleural or pericardial effusion is present. Hepatobiliary: Mild fatty infiltration of the liver is again noted. No discrete lesions are present. Wall thickening is present about the gallbladder. Pancreas: Pancreatic duct stent is stable in position. Diffuse inflammatory changes are again noted about the head of the pancreas. No discrete cystic lesions are present. The pancreas enhances throughout. Spleen: Normal in size without focal abnormality. Adrenals/Urinary Tract: The adrenal glands are normal bilaterally. Kidneys are unremarkable. No stone or mass lesion is present. Ureters are normal bilaterally. The urinary bladder is within normal limits. Stomach/Bowel: Mild inflammatory changes are present the distal stomach and duodenum, likely secondary to the pancreatic inflammation. The small bowel is otherwise unremarkable. Terminal ileum is within normal limits. The appendix is not discretely visualized and may be surgically absent. The ascending and transverse colon are within normal limits. Descending and sigmoid colon are normal. Vascular/Lymphatic: No significant vascular findings are present. No enlarged abdominal or pelvic lymph nodes. Reproductive: Prostate is unremarkable. Other: A small amount of free fluid is present within the anatomic pelvis. No significant ventral hernia is present. Musculoskeletal: The vertebral body heights and alignment are normal. No focal osseous lesions are present. The hips are located. IMPRESSION: 1. Diffuse inflammatory changes about the head of the pancreas compatible with acute pancreatitis. No complicating features. 2. Pancreatic duct stent is stable in position. 3. Hepatic steatosis. 4. Small amount of free fluid within the anatomic pelvis is likely reactive. Electronically Signed   By: Marin Roberts M.D.   On: 04/22/2023 17:35        Scheduled Meds:  insulin aspart  0-9 Units Subcutaneous Q4H   insulin glargine-yfgn  5 Units Subcutaneous QHS   pantoprazole (PROTONIX) IV  40 mg Intravenous Q12H   Continuous Infusions:  ampicillin-sulbactam (UNASYN) IV 3 g (04/24/23 0517)   lactated ringers 125 mL/hr at 04/24/23 0441     LOS: 2 days    Time spent: 35 minutes    Nimsi Males A Kerri-Anne Haeberle, MD [facility]   If 7PM-7AM, please contact night-coverage [email or web address]  04/24/2023, 11:14 AM

## 2023-04-24 NOTE — Progress Notes (Addendum)
Subjective: No significant change with his symptoms.  Objective: Vital signs in last 24 hours: Temp:  [98.3 F (36.8 C)-98.9 F (37.2 C)] 98.3 F (36.8 C) (06/16 0442) Pulse Rate:  [75-85] 84 (06/16 0442) Resp:  [15-17] 15 (06/16 0442) BP: (125-150)/(82-100) 125/82 (06/16 0442) SpO2:  [99 %-100 %] 99 % (06/16 0442) Weight:  [75.3 kg] 75.3 kg (06/16 0500) Last BM Date :  (PTA)  Intake/Output from previous day: 06/15 0701 - 06/16 0700 In: 1336.6 [I.V.:936.6; IV Piggyback:400] Out: -  Intake/Output this shift: No intake/output data recorded.  General appearance: alert and no distress GI: tender in the RUQ region  Lab Results: Recent Labs    04/22/23 1509 04/23/23 0303 04/24/23 0438  WBC 5.1 5.4 5.8  HGB 14.0 13.0 12.9*  HCT 42.3 38.2* 37.8*  PLT 179 162 152   BMET Recent Labs    04/22/23 1509 04/23/23 0303 04/24/23 0438  NA 133* 132* 130*  K 3.5 3.4* 3.6  CL 100 98 97*  CO2 21* 24 19*  GLUCOSE 165* 147* 141*  BUN 13 9 6   CREATININE 0.74 0.81 0.84  CALCIUM 9.0 8.5* 8.1*   LFT Recent Labs    04/24/23 0438  PROT 5.8*  ALBUMIN 2.3*  AST 240*  ALT 95*  ALKPHOS 1,213*  BILITOT 9.4*   PT/INR No results for input(s): "LABPROT", "INR" in the last 72 hours. Hepatitis Panel No results for input(s): "HEPBSAG", "HCVAB", "HEPAIGM", "HEPBIGM" in the last 72 hours. C-Diff No results for input(s): "CDIFFTOX" in the last 72 hours. Fecal Lactopherrin No results for input(s): "FECLLACTOFRN" in the last 72 hours.  Studies/Results: US Abdomen Limited RUQ (LIVER/GB)  Result Date: 04/22/2023 CLINICAL DATA:  Right upper quadrant pain. EXAM: ULTRASOUND ABDOMEN LIMITED RIGHT UPPER QUADRANT COMPARISON:  CT 04/22/2023 FINDINGS: Gallbladder: Calcification along the nondependent edge of the gallbladder likely represents layering stones but could indicate focal gallbladder wall calcification. Layering sludge in the gallbladder. Gallbladder wall is thickened at 4.6 mm. Murphy's  sign is negative. Common bile duct: Diameter: 5 mm, normal Liver: Increased liver parenchymal echotexture suggesting fatty infiltration. No focal lesions. Portal vein is patent on color Doppler imaging with normal direction of blood flow towards the liver. Other: None. IMPRESSION: Gallbladder calcification, most likely gallstones but could indicate focal mural calcification. Layering sludge and wall thickening. Murphy's sign is negative. Changes are nonspecific for acute cholecystitis. Clinical correlation is suggested. Electronically Signed   By: Burman Nieves M.D.   On: 04/22/2023 20:15   CT ABDOMEN PELVIS W CONTRAST  Result Date: 04/22/2023 CLINICAL DATA:  Epigastric pain EXAM: CT ABDOMEN AND PELVIS WITH CONTRAST TECHNIQUE: Multidetector CT imaging of the abdomen and pelvis was performed using the standard protocol following bolus administration of intravenous contrast. RADIATION DOSE REDUCTION: This exam was performed according to the departmental dose-optimization program which includes automated exposure control, adjustment of the mA and/or kV according to patient size and/or use of iterative reconstruction technique. CONTRAST:  OMNIPAQUE IOHEXOL 300 MG/ML  SOLN COMPARISON:  CT of the abdomen without and with contrast 02/03/2023. FINDINGS: Lower chest: The lung bases are clear without focal nodule, mass, or airspace disease. Heart size is normal. No significant pleural or pericardial effusion is present. Hepatobiliary: Mild fatty infiltration of the liver is again noted. No discrete lesions are present. Wall thickening is present about the gallbladder. Pancreas: Pancreatic duct stent is stable in position. Diffuse inflammatory changes are again noted about the head of the pancreas. No discrete cystic lesions are present. The  pancreas enhances throughout. Spleen: Normal in size without focal abnormality. Adrenals/Urinary Tract: The adrenal glands are normal bilaterally. Kidneys are unremarkable. No  stone or mass lesion is present. Ureters are normal bilaterally. The urinary bladder is within normal limits. Stomach/Bowel: Mild inflammatory changes are present the distal stomach and duodenum, likely secondary to the pancreatic inflammation. The small bowel is otherwise unremarkable. Terminal ileum is within normal limits. The appendix is not discretely visualized and may be surgically absent. The ascending and transverse colon are within normal limits. Descending and sigmoid colon are normal. Vascular/Lymphatic: No significant vascular findings are present. No enlarged abdominal or pelvic lymph nodes. Reproductive: Prostate is unremarkable. Other: A small amount of free fluid is present within the anatomic pelvis. No significant ventral hernia is present. Musculoskeletal: The vertebral body heights and alignment are normal. No focal osseous lesions are present. The hips are located. IMPRESSION: 1. Diffuse inflammatory changes about the head of the pancreas compatible with acute pancreatitis. No complicating features. 2. Pancreatic duct stent is stable in position. 3. Hepatic steatosis. 4. Small amount of free fluid within the anatomic pelvis is likely reactive. Electronically Signed   By: Marin Roberts M.D.   On: 04/22/2023 17:35    Medications: Scheduled:  insulin aspart  0-9 Units Subcutaneous Q4H   insulin glargine-yfgn  5 Units Subcutaneous QHS   pantoprazole (PROTONIX) IV  40 mg Intravenous Q12H   Continuous:  ampicillin-sulbactam (UNASYN) IV 3 g (04/24/23 0517)   lactated ringers 125 mL/hr at 04/24/23 0441    Assessment/Plan: 1) Elevated liver enzymes. 2) RUQ pain. 3) ? Cholecystitis.   Clinically he is stable.  AP did increase.  NO evidence of any fever and his WBC remains normal.  Appreciate Drs. Lovick and Hassell's input.  The plan will be to perform a HIDA scan to assess for a gallbladder source.  Plan: 1) Await HIDA. 2) [place] [facility] to assume care in the AM.  LOS: 2 days    Damoney Julia D 04/24/2023, 7:31 AM

## 2023-04-25 ENCOUNTER — Inpatient Hospital Stay (HOSPITAL_COMMUNITY): Payer: Medicaid Other

## 2023-04-25 DIAGNOSIS — K861 Other chronic pancreatitis: Secondary | ICD-10-CM | POA: Diagnosis not present

## 2023-04-25 DIAGNOSIS — K859 Acute pancreatitis without necrosis or infection, unspecified: Secondary | ICD-10-CM | POA: Diagnosis not present

## 2023-04-25 LAB — CBC
HCT: 36.8 % — ABNORMAL LOW (ref 39.0–52.0)
Hemoglobin: 12.7 g/dL — ABNORMAL LOW (ref 13.0–17.0)
MCH: 33.9 pg (ref 26.0–34.0)
MCHC: 34.5 g/dL (ref 30.0–36.0)
MCV: 98.1 fL (ref 80.0–100.0)
Platelets: 193 10*3/uL (ref 150–400)
RBC: 3.75 MIL/uL — ABNORMAL LOW (ref 4.22–5.81)
RDW: 12.9 % (ref 11.5–15.5)
WBC: 6.1 10*3/uL (ref 4.0–10.5)
nRBC: 0 % (ref 0.0–0.2)

## 2023-04-25 LAB — COMPREHENSIVE METABOLIC PANEL
ALT: 161 U/L — ABNORMAL HIGH (ref 0–44)
AST: 397 U/L — ABNORMAL HIGH (ref 15–41)
Albumin: 2.3 g/dL — ABNORMAL LOW (ref 3.5–5.0)
Alkaline Phosphatase: 1301 U/L — ABNORMAL HIGH (ref 38–126)
Anion gap: 15 (ref 5–15)
BUN: <5 mg/dL — ABNORMAL LOW (ref 6–20)
CO2: 20 mmol/L — ABNORMAL LOW (ref 22–32)
Calcium: 8.4 mg/dL — ABNORMAL LOW (ref 8.9–10.3)
Chloride: 96 mmol/L — ABNORMAL LOW (ref 98–111)
Creatinine, Ser: 0.92 mg/dL (ref 0.61–1.24)
GFR, Estimated: >60 mL/min (ref >60)
Glucose, Bld: 159 mg/dL — ABNORMAL HIGH (ref 70–99)
Potassium: 2.9 mmol/L — ABNORMAL LOW (ref 3.5–5.1)
Sodium: 131 mmol/L — ABNORMAL LOW (ref 135–145)
Total Bilirubin: 13.2 mg/dL — ABNORMAL HIGH (ref 0.3–1.2)
Total Protein: 6 g/dL — ABNORMAL LOW (ref 6.5–8.1)

## 2023-04-25 LAB — HEPATITIS PANEL, ACUTE
HCV Ab: NONREACTIVE
Hep A IgM: NONREACTIVE
Hep B C IgM: NONREACTIVE
Hepatitis B Surface Ag: NONREACTIVE

## 2023-04-25 LAB — FERRITIN: Ferritin: 752 ng/mL — ABNORMAL HIGH (ref 24–336)

## 2023-04-25 LAB — PROTIME-INR
INR: 1 (ref 0.8–1.2)
Prothrombin Time: 13.8 seconds (ref 11.4–15.2)

## 2023-04-25 LAB — IRON AND TIBC
Iron: 104 ug/dL (ref 45–182)
Saturation Ratios: 36 % (ref 17.9–39.5)
TIBC: 288 ug/dL (ref 250–450)
UIBC: 184 ug/dL

## 2023-04-25 LAB — GLUCOSE, CAPILLARY
Glucose-Capillary: 126 mg/dL — ABNORMAL HIGH (ref 70–99)
Glucose-Capillary: 144 mg/dL — ABNORMAL HIGH (ref 70–99)
Glucose-Capillary: 159 mg/dL — ABNORMAL HIGH (ref 70–99)
Glucose-Capillary: 173 mg/dL — ABNORMAL HIGH (ref 70–99)
Glucose-Capillary: 178 mg/dL — ABNORMAL HIGH (ref 70–99)
Glucose-Capillary: 244 mg/dL — ABNORMAL HIGH (ref 70–99)

## 2023-04-25 LAB — PHOSPHORUS: Phosphorus: 3.3 mg/dL (ref 2.5–4.6)

## 2023-04-25 LAB — MAGNESIUM: Magnesium: 1.7 mg/dL (ref 1.7–2.4)

## 2023-04-25 MED ORDER — ONDANSETRON HCL 4 MG/2ML IJ SOLN: 4.0000 mg | Freq: Four times a day (QID) | INTRAMUSCULAR | Status: DC | PRN

## 2023-04-25 MED ORDER — IPRATROPIUM-ALBUTEROL 0.5-2.5 (3) MG/3ML IN SOLN: 3.0000 mL | RESPIRATORY_TRACT | Status: DC | PRN

## 2023-04-25 MED ORDER — TRAZODONE HCL 50 MG PO TABS
50.0000 mg | ORAL_TABLET | Freq: Every evening | ORAL | Status: DC | PRN
  Administered 2023-04-25 – 2023-04-29 (×4): 50 mg via ORAL
  Filled 2023-04-25 (×4): qty 1

## 2023-04-25 MED ORDER — SENNOSIDES-DOCUSATE SODIUM 8.6-50 MG PO TABS: 1.0000 | ORAL_TABLET | Freq: Every evening | ORAL | Status: DC | PRN

## 2023-04-25 MED ORDER — GUAIFENESIN 100 MG/5ML PO LIQD: 5.0000 mL | ORAL | Status: DC | PRN

## 2023-04-25 MED ORDER — TECHNETIUM TC 99M MEBROFENIN IV KIT
10.6000 | PACK | Freq: Once | INTRAVENOUS | Status: AC | PRN
  Administered 2023-04-25: 10.6 via INTRAVENOUS

## 2023-04-25 MED ORDER — POTASSIUM CHLORIDE 10 MEQ/100ML IV SOLN
10.0000 meq | INTRAVENOUS | Status: DC
  Administered 2023-04-25 (×2): 10 meq via INTRAVENOUS
  Filled 2023-04-25: qty 100

## 2023-04-25 MED ORDER — HYDRALAZINE HCL 20 MG/ML IJ SOLN: 10.0000 mg | INTRAMUSCULAR | Status: DC | PRN

## 2023-04-25 MED ORDER — POTASSIUM CHLORIDE 10 MEQ/100ML IV SOLN
10.0000 meq | INTRAVENOUS | Status: AC
  Administered 2023-04-25 (×3): 10 meq via INTRAVENOUS
  Filled 2023-04-25 (×4): qty 100

## 2023-04-25 MED ORDER — METOPROLOL TARTRATE 5 MG/5ML IV SOLN: 5.0000 mg | INTRAVENOUS | Status: DC | PRN

## 2023-04-25 NOTE — Progress Notes (Signed)
PROGRESS NOTE    Carlos [name]  ZOX:[id number] DOB: 29-Jul-1986 DOA: 04/22/2023 PCP: [name], MD   Brief Narrative:  37 year old with past medical history significant for alcoholism and in remission, insulin-dependent diabetes, chronic pancreatitis, pancreatic duct stricture with a stent, history of splenic vein thrombosis presented with severe upper abdominal pain, nausea and vomiting.  Patient was diagnosed with acute pancreatitis with previous pancreatic stent stable in position.  Right upper quadrant ultrasound showed gallstones/sludging.  General surgery and GI were consulted. HIDA pending.    Assessment & Plan:  Principal Problem:   Acute on chronic pancreatitis (HCC) Active Problems:   Uncontrolled type 1 diabetes mellitus with hyperglycemia, with long-term current use of insulin (HCC)   Elevated LFTs   Stricture of pancreatic duct      Acute on chronic pancreatitis, pancreatic ductal stricture Transaminitis, elevated total bilirubin Gallstones with concerns of cholecystitis -CT abdomen pelvis consistent with acute pancreatitis.  Right upper quadrant ultrasound showing gallstones with some sludging. Acute hep panel, iron studies - GI and general surgery consulted.  Pain control, supportive care, antiemetics - HIDA scan planned with possible need for cholecystostomy - IV fluids   Hypokalemia -As needed repletion.  Check magnesium and phosphorus     Insulin-dependent diabetes type 2: Sliding scale and Accu-Cheks   Hyponatremia -From dehydration.  IV fluids    Estimated body mass index is 19.69 kg/m as calculated from the following:   Height as of this encounter: 6\' 5"  (1.956 m).   Weight as of this encounter: 75.3 kg.     DVT prophylaxis: SCD Code Status: Full code Family Communication:Care discussed with patient.  Disposition Plan:  Status is: Inpatient Remains inpatient appropriate because: management of Transaminases, abdominal pain.       Diet  Orders (From admission, onward)     Start     Ordered   04/22/23 1923  Diet NPO time specified Except for: [employer], Sips with Meds  Diet effective now       Question Answer Comment  Except for Ice Chips   Except for Sips with Meds      04/22/23 1923            Subjective: Unable to see this ptn in person due to covering multiple campuses. Visited multiple times, he was gone for HIDA scan.  Will speak over the phone. Chart reviewed.    Examination: Unable to perform  Objective: Vitals:   04/24/23 1936 04/25/23 0423 04/25/23 0500 04/25/23 0736  BP: (!) 125/92 129/78  130/75  Pulse: 77 84  78  Resp: 15 15  18   Temp: 98.5 F (36.9 C) 98 F (36.7 C)  98.3 F (36.8 C)  TempSrc: Oral Oral  Oral  SpO2: 100% 100%  99%  Weight:   75 kg   Height:        Intake/Output Summary (Last 24 hours) at 04/25/2023 0745 Last data filed at 04/25/2023 0436 Gross per 24 hour  Intake 1308.49 ml  Output --  Net 1308.49 ml   Filed Weights   04/22/23 1435 04/24/23 0500 04/25/23 0500  Weight: 79.4 kg 75.3 kg 75 kg    Scheduled Meds:  insulin aspart  0-9 Units Subcutaneous Q4H   insulin glargine-yfgn  5 Units Subcutaneous QHS   pantoprazole (PROTONIX) IV  40 mg Intravenous Q12H   Continuous Infusions:  ampicillin-sulbactam (UNASYN) IV 3 g (04/25/23 8119)   lactated ringers 100 mL/hr at 04/25/23 0436    Nutritional status  Body mass index is 19.61 kg/m.  Data Reviewed:   CBC: Recent Labs  Lab 04/22/23 1509 04/23/23 0303 04/24/23 0438 04/25/23 0500  WBC 5.1 5.4 5.8 6.1  NEUTROABS 3.6  --   --   --   HGB 14.0 13.0 12.9* 12.7*  HCT 42.3 38.2* 37.8* 36.8*  MCV 105.0* 101.1* 100.3* 98.1  PLT 179 162 152 193   Basic Metabolic Panel: Recent Labs  Lab 04/22/23 1509 04/23/23 0303 04/24/23 0438 04/25/23 0500  NA 133* 132* 130* 131*  K 3.5 3.4* 3.6 2.9*  CL 100 98 97* 96*  CO2 21* 24 19* 20*  GLUCOSE 165* 147* 141* 159*  BUN 13 9 6  <5*  CREATININE 0.74 0.81  0.84 0.92  CALCIUM 9.0 8.5* 8.1* 8.4*  MG  --  1.5*  --   --    GFR: Estimated Creatinine Clearance: 116.6 mL/min (by C-G formula based on SCr of 0.92 mg/dL). Liver Function Tests: Recent Labs  Lab 04/22/23 1509 04/23/23 0303 04/24/23 0438 04/25/23 0500  AST 167* 146* 240* 397*  ALT 97* 86* 95* 161*  ALKPHOS 1,064* 1,098* 1,213* 1,301*  BILITOT 5.8* 5.9* 9.4* 13.2*  PROT 7.2 6.0* 5.8* 6.0*  ALBUMIN 3.2* 2.6* 2.3* 2.3*   Recent Labs  Lab 04/22/23 1509  LIPASE 19   No results for input(s): "AMMONIA" in the last 168 hours. Coagulation Profile: No results for input(s): "INR", "PROTIME" in the last 168 hours. Cardiac Enzymes: No results for input(s): "CKTOTAL", "CKMB", "CKMBINDEX", "TROPONINI" in the last 168 hours. BNP (last 3 results) No results for input(s): "PROBNP" in the last 8760 hours. HbA1C: No results for input(s): "HGBA1C" in the last 72 hours. CBG: Recent Labs  Lab 04/24/23 1159 04/24/23 1540 04/24/23 1935 04/25/23 0025 04/25/23 0421  GLUCAP 175* 170* 160* 178* 173*   Lipid Profile: No results for input(s): "CHOL", "HDL", "LDLCALC", "TRIG", "CHOLHDL", "LDLDIRECT" in the last 72 hours. Thyroid Function Tests: No results for input(s): "TSH", "T4TOTAL", "FREET4", "T3FREE", "THYROIDAB" in the last 72 hours. Anemia Panel: No results for input(s): "VITAMINB12", "FOLATE", "FERRITIN", "TIBC", "IRON", "RETICCTPCT" in the last 72 hours. Sepsis Labs: No results for input(s): "PROCALCITON", "LATICACIDVEN" in the last 168 hours.  No results found for this or any previous visit (from the past 240 hour(s)).       Radiology Studies: No results found.         LOS: 3 days   Time spent= 35 mins    Emylia Latella Joline Maxcy, MD [facility]  If 7PM-7AM, please contact night-coverage  04/25/2023, 7:45 AM

## 2023-04-25 NOTE — Progress Notes (Signed)
Patient to nuclear med for hida scan at this time

## 2023-04-25 NOTE — Progress Notes (Signed)
Transition of Care [facility]) - Inpatient Brief Assessment   Patient Details  Name: Carlos [name] MRN: [id number] Date of Birth: [birth year]  [facility] CM/SW Contact:    Janae Bridgeman, RN Phone Number: 04/25/2023, 11:17 AM   Clinical Narrative: No TOC needs determined at this time.  The patient was admitted from home with Acute on Chronic pancreatitis and is awaiting HIDA scan today.  Will continue to follow the patient - no needs at this time.   Transition of Care Asessment: Insurance and Status: (P) Insurance coverage has been reviewed Patient has primary care physician: (P) Yes Home environment has been reviewed: (P) Yes Prior level of function:: (P) Independent Prior/Current Home Services: (P) No current home services Social Determinants of Health Reivew: (P) SDOH reviewed no interventions necessary Readmission risk has been reviewed: (P) Yes Transition of care needs: (P) no transition of care needs at this time

## 2023-04-25 NOTE — Progress Notes (Signed)
Patient returns from hida scan at this time

## 2023-04-25 NOTE — Progress Notes (Signed)
Subjective: Still with some epigastric and RUQ pain.  Noted his urine to be even darker and his eyes to be even more yellow.  ROS: See above, otherwise other systems negative  Objective: Vital signs in last 24 hours: Temp:  [98 F (36.7 C)-98.5 F (36.9 C)] 98.3 F (36.8 C) (06/17 0736) Pulse Rate:  [72-84] 78 (06/17 0736) Resp:  [15-19] 18 (06/17 0736) BP: (125-134)/(75-92) 130/75 (06/17 0736) SpO2:  [99 %-100 %] 99 % (06/17 0736) Weight:  [75 kg] 75 kg (06/17 0500) Last BM Date :  (PTA)  Intake/Output from previous day: 06/16 0701 - 06/17 0700 In: 1308.5 [I.V.:908.5; IV Piggyback:400] Out: -  Intake/Output this shift: No intake/output data recorded.  PE: Gen: NAD HEENT: sclera icteric Abd: soft, mild RUQ and epigastric tenderness, +BS, ND  Lab Results:  Recent Labs    04/24/23 0438 04/25/23 0500  WBC 5.8 6.1  HGB 12.9* 12.7*  HCT 37.8* 36.8*  PLT 152 193   BMET Recent Labs    04/24/23 0438 04/25/23 0500  NA 130* 131*  K 3.6 2.9*  CL 97* 96*  CO2 19* 20*  GLUCOSE 141* 159*  BUN 6 <5*  CREATININE 0.84 0.92  CALCIUM 8.1* 8.4*   PT/INR Recent Labs    04/25/23 0734  LABPROT 13.8  INR 1.0   CMP     Component Value Date/Time   NA 131 (L) 04/25/2023 0500   K 2.9 (L) 04/25/2023 0500   CL 96 (L) 04/25/2023 0500   CO2 20 (L) 04/25/2023 0500   GLUCOSE 159 (H) 04/25/2023 0500   BUN <5 (L) 04/25/2023 0500   CREATININE 0.92 04/25/2023 0500   CREATININE 0.89 08/02/2022 0914   CALCIUM 8.4 (L) 04/25/2023 0500   PROT 6.0 (L) 04/25/2023 0500   ALBUMIN 2.3 (L) 04/25/2023 0500   AST 397 (H) 04/25/2023 0500   ALT 161 (H) 04/25/2023 0500   ALKPHOS 1,301 (H) 04/25/2023 0500   BILITOT 13.2 (H) 04/25/2023 0500   GFRNONAA >60 04/25/2023 0500   GFRNONAA 85 07/03/2019 1435   GFRAA  10/01/2020 1400    QUESTIONABLE RESULTS, RECOMMEND RECOLLECT TO VERIFY   GFRAA 98 07/03/2019 1435   Lipase     Component Value Date/Time   LIPASE 19 04/22/2023 1509        Studies/Results: No results found.  Anti-infectives: Anti-infectives (From admission, onward)    Start     Dose/Rate Route Frequency Ordered Stop   04/23/23 0800  Ampicillin-Sulbactam (UNASYN) 3 g in sodium chloride 0.9 % 100 mL IVPB        3 g 200 mL/hr over 30 Minutes Intravenous Every 6 hours 04/23/23 0723          Assessment/Plan Abdominal pain with significantly elevated TB, alkphos, and transaminitis, history of chronic pancreatitis, ? Cholecystitis -the patient has a history of a pancreatic stent. -worsening LFTs today -HIDA scan pending -WBC normal and AF, would be odd to have that significant of LFTs with no leukocytosis to indicated an acute infection -query some type of CBD obstruction -will follow up after HIDA and further GI recommendations  -PT/INR normal  FEN - NPO for HIDA, hypokalemia, replete per primary VTE - none ID - unasyn  DM H/O Splenic vein thrombosis  I reviewed Consultant GI notes, hospitalist notes, last 24 h vitals and pain scores, last 48 h intake and output, last 24 h labs and trends, and last 24 h imaging results.   LOS: 3 days  Letha Cape , [facility] Surgery 04/25/2023, 9:01 AM Please see Amion for pager number during day hours 7:00am-4:30pm or 7:00am -11:30am on weekends

## 2023-04-25 NOTE — Progress Notes (Signed)
    Progress Note   Subjective  Chief Complaint: Elevated LFTs and right upper quadrant pain with question of cholecystitis  Today, patient tells me he continues with right upper quadrant pain which is constant, he is hungry as he has not eaten in a few days, awaiting HIDA scan.   Objective   Vital signs in last 24 hours: Temp:  [98 F (36.7 C)-98.5 F (36.9 C)] 98.3 F (36.8 C) (06/17 0736) Pulse Rate:  [72-84] 78 (06/17 0736) Resp:  [15-19] 18 (06/17 0736) BP: (125-134)/(75-92) 130/75 (06/17 0736) SpO2:  [99 %-100 %] 99 % (06/17 0736) Weight:  [75 kg] 75 kg (06/17 0500) Last BM Date :  (PTA) General:   AA male in NAD, + icterus Heart:  Regular rate and rhythm; no murmurs Lungs: Respirations even and unlabored, lungs CTA bilaterally Abdomen:  Soft, moderate RUQ ttp and nondistended. Normal bowel sounds. Psych:  Cooperative. Normal mood and affect.  Intake/Output from previous day: 06/16 0701 - 06/17 0700 In: 1308.5 [I.V.:908.5; IV Piggyback:400] Out: -    Lab Results: Recent Labs    04/23/23 0303 04/24/23 0438 04/25/23 0500  WBC 5.4 5.8 6.1  HGB 13.0 12.9* 12.7*  HCT 38.2* 37.8* 36.8*  PLT 162 152 193   BMET Recent Labs    04/23/23 0303 04/24/23 0438 04/25/23 0500  NA 132* 130* 131*  K 3.4* 3.6 2.9*  CL 98 97* 96*  CO2 24 19* 20*  GLUCOSE 147* 141* 159*  BUN 9 6 <5*  CREATININE 0.81 0.84 0.92  CALCIUM 8.5* 8.1* 8.4*      Latest Ref Rng & Units 04/25/2023    5:00 AM 04/24/2023    4:38 AM 04/23/2023    3:03 AM  Hepatic Function  Total Protein 6.5 - 8.1 g/dL 6.0  5.8  6.0   Albumin 3.5 - 5.0 g/dL 2.3  2.3  2.6   AST 15 - 41 U/L 397  240  146   ALT 0 - 44 U/L 161  95  86   Alk Phosphatase 38 - 126 U/L 1,301  1,213  1,098   Total Bilirubin 0.3 - 1.2 mg/dL 78.4  9.4  5.9      PT/INR Recent Labs    04/25/23 0734  LABPROT 13.8  INR 1.0    Assessment / Plan:   Assessment: 1.  Elevated LFTs: Increasing today, continued right upper quadrant pain,  HIDA scan pending given that imaging showed gallbladder was markedly distended and thickened, INR normal, bili increased from 9.4-13.2 today 2.  Right upper quadrant pain 3.  Acute on chronic pancreatitis: Prior pancreatic stent-in position with no evidence of inflammation, lipase normal  Plan: 1.  HIDA scan is scheduled for today, it was actually scheduled at 11:00 but they had not come to get the patient at 1130 when I was in his room. 2.  Pending above will need to consider further workup for elevated LFTs including other serologies 3.  Otherwise appreciate surgical team's recommendations  Thank you for your kind consultation, we will continue to follow along.   LOS: 3 days   Unk Lightning  04/25/2023, 11:56 AM

## 2023-04-26 ENCOUNTER — Inpatient Hospital Stay (HOSPITAL_COMMUNITY): Payer: Medicaid Other

## 2023-04-26 DIAGNOSIS — K861 Other chronic pancreatitis: Secondary | ICD-10-CM | POA: Diagnosis not present

## 2023-04-26 DIAGNOSIS — K859 Acute pancreatitis without necrosis or infection, unspecified: Secondary | ICD-10-CM | POA: Diagnosis not present

## 2023-04-26 LAB — GLUCOSE, CAPILLARY
Glucose-Capillary: 148 mg/dL — ABNORMAL HIGH (ref 70–99)
Glucose-Capillary: 179 mg/dL — ABNORMAL HIGH (ref 70–99)
Glucose-Capillary: 181 mg/dL — ABNORMAL HIGH (ref 70–99)
Glucose-Capillary: 192 mg/dL — ABNORMAL HIGH (ref 70–99)
Glucose-Capillary: 275 mg/dL — ABNORMAL HIGH (ref 70–99)
Glucose-Capillary: 293 mg/dL — ABNORMAL HIGH (ref 70–99)

## 2023-04-26 LAB — CBC
HCT: 35.6 % — ABNORMAL LOW (ref 39.0–52.0)
Hemoglobin: 12.3 g/dL — ABNORMAL LOW (ref 13.0–17.0)
MCH: 33.8 pg (ref 26.0–34.0)
MCHC: 34.6 g/dL (ref 30.0–36.0)
MCV: 97.8 fL (ref 80.0–100.0)
Platelets: 220 10*3/uL (ref 150–400)
RBC: 3.64 MIL/uL — ABNORMAL LOW (ref 4.22–5.81)
RDW: 13.3 % (ref 11.5–15.5)
WBC: 6.9 10*3/uL (ref 4.0–10.5)
nRBC: 0 % (ref 0.0–0.2)

## 2023-04-26 LAB — COMPREHENSIVE METABOLIC PANEL
ALT: 225 U/L — ABNORMAL HIGH (ref 0–44)
AST: 458 U/L — ABNORMAL HIGH (ref 15–41)
Albumin: 2.4 g/dL — ABNORMAL LOW (ref 3.5–5.0)
Alkaline Phosphatase: 1219 U/L — ABNORMAL HIGH (ref 38–126)
Anion gap: 12 (ref 5–15)
BUN: <5 mg/dL — ABNORMAL LOW (ref 6–20)
CO2: 21 mmol/L — ABNORMAL LOW (ref 22–32)
Calcium: 8.6 mg/dL — ABNORMAL LOW (ref 8.9–10.3)
Chloride: 99 mmol/L (ref 98–111)
Creatinine, Ser: 0.86 mg/dL (ref 0.61–1.24)
GFR, Estimated: >60 mL/min (ref >60)
Glucose, Bld: 186 mg/dL — ABNORMAL HIGH (ref 70–99)
Potassium: 3.4 mmol/L — ABNORMAL LOW (ref 3.5–5.1)
Sodium: 132 mmol/L — ABNORMAL LOW (ref 135–145)
Total Bilirubin: 15.8 mg/dL — ABNORMAL HIGH (ref 0.3–1.2)
Total Protein: 6 g/dL — ABNORMAL LOW (ref 6.5–8.1)

## 2023-04-26 LAB — PHOSPHORUS: Phosphorus: 3.4 mg/dL (ref 2.5–4.6)

## 2023-04-26 LAB — MITOCHONDRIAL ANTIBODIES: Mitochondrial M2 Ab, IgG: 20.4 Units — ABNORMAL HIGH (ref 0.0–20.0)

## 2023-04-26 LAB — ANA: Anti Nuclear Antibody (ANA): NEGATIVE

## 2023-04-26 LAB — IGG: IgG (Immunoglobin G), Serum: 1020 mg/dL (ref 603–1613)

## 2023-04-26 LAB — MAGNESIUM: Magnesium: 1.7 mg/dL (ref 1.7–2.4)

## 2023-04-26 LAB — ANTI-SMOOTH MUSCLE ANTIBODY, IGG: F-Actin IgG: 6 Units (ref 0–19)

## 2023-04-26 MED ORDER — MAGNESIUM SULFATE 2 GM/50ML IV SOLN
2.0000 g | Freq: Once | INTRAVENOUS | Status: AC
  Administered 2023-04-26: 2 g via INTRAVENOUS
  Filled 2023-04-26: qty 50

## 2023-04-26 MED ORDER — INSULIN GLARGINE-YFGN 100 UNIT/ML ~~LOC~~ SOLN
6.0000 [IU] | Freq: Two times a day (BID) | SUBCUTANEOUS | Status: DC
  Administered 2023-04-26 – 2023-04-30 (×8): 6 [IU] via SUBCUTANEOUS
  Filled 2023-04-26 (×11): qty 0.06

## 2023-04-26 MED ORDER — POTASSIUM CHLORIDE 10 MEQ/100ML IV SOLN
10.0000 meq | INTRAVENOUS | Status: AC
  Administered 2023-04-26: 10 meq via INTRAVENOUS
  Filled 2023-04-26: qty 100

## 2023-04-26 MED ORDER — POTASSIUM CHLORIDE 10 MEQ/100ML IV SOLN
10.0000 meq | INTRAVENOUS | Status: AC
  Administered 2023-04-26 (×3): 10 meq via INTRAVENOUS
  Filled 2023-04-26 (×3): qty 100

## 2023-04-26 MED ORDER — OXYCODONE HCL 5 MG PO TABS
10.0000 mg | ORAL_TABLET | ORAL | Status: DC | PRN
  Administered 2023-04-26 – 2023-04-30 (×5): 10 mg via ORAL
  Filled 2023-04-26 (×5): qty 2

## 2023-04-26 MED ORDER — GADOBUTROL 1 MMOL/ML IV SOLN
7.0000 mL | Freq: Once | INTRAVENOUS | Status: AC | PRN
  Administered 2023-04-26: 7 mL via INTRAVENOUS

## 2023-04-26 NOTE — Progress Notes (Signed)
Progress Note   Subjective  Chief Complaint: Elevated LFTs and right upper quadrant pain  Today, the patient tells me his pain level is the same.  He has been n.p.o. all day and is requesting something to eat.    Objective   Vital signs in last 24 hours: Temp:  [98.1 F (36.7 C)-98.4 F (36.9 C)] 98.3 F (36.8 C) (06/18 0903) Pulse Rate:  [70-82] 79 (06/18 0903) Resp:  [15-17] 17 (06/18 0903) BP: (120-145)/(78-98) 128/91 (06/18 0903) SpO2:  [100 %] 100 % (06/18 0903) Last BM Date : 04/24/23 General:    AA male in NAD Heart:  Regular rate and rhythm; no murmurs Lungs: Respirations even and unlabored, lungs CTA bilaterally Abdomen:  Soft, moderate RUQ ttp and nondistended. Normal bowel sounds. Psych:  Cooperative. Normal mood and affect.  Lab Results: Recent Labs    04/24/23 0438 04/25/23 0500 04/26/23 0446  WBC 5.8 6.1 6.9  HGB 12.9* 12.7* 12.3*  HCT 37.8* 36.8* 35.6*  PLT 152 193 220   BMET Recent Labs    04/24/23 0438 04/25/23 0500 04/26/23 0446  NA 130* 131* 132*  K 3.6 2.9* 3.4*  CL 97* 96* 99  CO2 19* 20* 21*  GLUCOSE 141* 159* 186*  BUN 6 <5* <5*  CREATININE 0.84 0.92 0.86  CALCIUM 8.1* 8.4* 8.6*      Latest Ref Rng & Units 04/26/2023    4:46 AM 04/25/2023    5:00 AM 04/24/2023    4:38 AM  Hepatic Function  Total Protein 6.5 - 8.1 g/dL 6.0  6.0  5.8   Albumin 3.5 - 5.0 g/dL 2.4  2.3  2.3   AST 15 - 41 U/L 458  397  240   ALT 0 - 44 U/L 225  161  95   Alk Phosphatase 38 - 126 U/L 1,219  1,301  1,213   Total Bilirubin 0.3 - 1.2 mg/dL 86.5  78.4  9.4      PT/INR Recent Labs    04/25/23 0734  LABPROT 13.8  INR 1.0    Studies/Results: MR ABDOMEN MRCP W WO CONTAST  Result Date: 04/26/2023 CLINICAL DATA:  Elevated liver function tests. Jaundice. History of alcoholism, diabetes and chronic pancreatitis. Pancreatic duct stricture with stent. History of splenic vein thrombosis. EXAM: MRI ABDOMEN WITHOUT AND WITH CONTRAST (INCLUDING MRCP)  TECHNIQUE: Multiplanar multisequence MR imaging of the abdomen was performed both before and after the administration of intravenous contrast. Heavily T2-weighted images of the biliary and pancreatic ducts were obtained, and three-dimensional MRCP images were rendered by post processing. CONTRAST:  7mL GADAVIST GADOBUTROL 1 MMOL/ML IV SOLN COMPARISON:  HIDA scan 04/25/2023. Ultrasound and CT 04/22/2023. Multiple older exams as well. MRI January 2024. FINDINGS: Lower chest: Trace pleural fluid bilaterally. Borderline size heart. Hepatobiliary: There is mild diffuse intrahepatic biliary ductal dilatation which is progressive from MRI of January 2024. Diameter of the common duct proximally approaches 6-7 mm. More distally as the duct approaches the pancreatic head there is an abrupt cutoff of the duct, presumed significant stricture. On postcontrast there is some wall thickening enhancement at the transition. There is also a cystic area just caudal to the narrowing of the duct in the pancreatic head measuring 2.1 x 1.5 cm. This has not seen in the MRI of January 2024. This was seen on the recent CT scan. The liver itself portal vein is patent but there is also several collaterals in the porta hepatis. Please correlate with previous venous occlusion with  some cavernous transformation and collaterals. No enhancing liver lesion. No restricted diffusion of the liver. Gallbladder is dilated.  Layering sludge.  Small stones. Pancreas: Pancreatic ductal dilatation towards the body/tail region measuring up to 6 mm, severe. Caliber change along the midbody and neck region with a stricture. On recent CT scan there is a pancreatic duct stent which is not as well appreciated on this MRI but likely present based on the appearance. As described above there is a complex fluid collection with septa along the head of the pancreas. Please correlate for developing pseudocysts. Otherwise the pancreas on T2 is diffusely edematous with  bright T2 signal and heterogeneous enhancement. There are areas of atrophy as well. Adjacent stranding. Please correlate for any component of acute on chronic pancreatitis. Spleen:  Within normal limits in size and appearance. Adrenals/Urinary Tract: No masses identified. No evidence of hydronephrosis. Stomach/Bowel: The visualized bowel is nondilated. There are some scattered stool. The stomach has areas of wall thickening along the body and antrum. Slight wall thickening as well along the area of the duodenal. Vascular/Lymphatic: Normal caliber aorta with some mild atherosclerotic plaque. Normal caliber IVC. There are several upper abdominal nodes. Example node in the porta hepatis measures 2.2 by 1.4 cm. Other nodes are borderline to slightly enlarged. There is narrowing of the main portal vein proximally near the portal venous confluence. The several collateral vessels throughout the retroperitoneum and upper abdomen. This includes porta hepatis, central mesentery and gastric cascade. Other:  Mesenteric stranding with trace ascites. Musculoskeletal: No suspicious bone lesions identified. IMPRESSION: Diffuse edema of the pancreas with atrophy. Adjacent stranding as well. Please correlate for any clinical evidence of acute on chronic pancreatitis. The calcification seen by prior CT are less appreciated today. History of an indwelling pancreatic duct stent. There is severe dilatation of the duct towards the tail with a central stricture with a stent in place. Worsening biliary ductal dilatation with abrupt caliber change of the distal common duct as the duct enters the pancreatic head. In this location there is an adjacent complex cystic lesion, possibly evolving pseudocyst. True severe common duct stricture is possible either from mass effect or true intrinsic narrowing. There is some enhancing wall to the duct in this location. Please correlate with findings at ERCP. Distended/dilated gallbladder with sludge and  stones. Several collateral vessels throughout the upper abdomen including the porta hepatis, central mesentery in the left upper quadrant with the areas of narrowing of the main portal vein near the portal venous confluence. Please correlate with previous venous occlusion. Several prominent to mildly enlarged upper abdominal lymph nodes. Attention on follow-up. Electronically Signed   By: Karen Kays M.D.   On: 04/26/2023 11:39   NM Hepatobiliary Liver Func  Result Date: 04/25/2023 CLINICAL DATA:  Elevated bilirubin.  Concern for cholecystitis. EXAM: NUCLEAR MEDICINE HEPATOBILIARY IMAGING TECHNIQUE: Sequential images of the abdomen were obtained out to 60 minutes following intravenous administration of radiopharmaceutical. RADIOPHARMACEUTICALS:  10.6 mCi Tc-15m  Choletec IV COMPARISON:  04/22/2023 FINDINGS: Prompt uptake of activity by the liver is seen. After 120 minutes of imaging no biliary or small-bowel activity identified with persistent tracer uptake throughout both lobes of the liver. IMPRESSION: 1. No signs of biliary or small bowel activity after 120 minutes of imaging with persistent uptake throughout the liver. Imaging findings are compatible with either common bile duct obstruction or hepatocyte dysfunction. Electronically Signed   By: Signa Kell M.D.   On: 04/25/2023 15:46       Assessment /  Plan:   Assessment: 1.  Elevated LFTs: Continually increasing, HIDA scan was incomplete due to likely bile duct obstruction, MRCP today confirming obstruction 2.  Right upper quadrant pain 3.  Acute on chronic pancreatitis: Prior pancreatic stent in position  Plan: 1.  Reviewed MRCP findings with the patient today.  Plans are for ERCP, this is scheduled for 04/28/2023 with Dr. [name].  Discussed risks, benefits, limitations and alternatives with patient he agrees to proceed. 2.  Patient was placed on full liquid diet for now and will be n.p.o. on the morning of 04/28/2023 3.  Continue to trend  LFTs  Thank you for your kind consultation, we will continue to follow along.    LOS: 4 days   Unk Lightning  04/26/2023, 2:00 PM

## 2023-04-26 NOTE — Progress Notes (Signed)
PROGRESS NOTE    Carlos [name]  ZOX:[id number] DOB: [birth year] DOA: 04/22/2023 PCP: [name], MD   Brief Narrative:  37 year old with past medical history significant for alcoholism and in remission, insulin-dependent diabetes, chronic pancreatitis, pancreatic duct stricture with a stent, history of splenic vein thrombosis presented with severe upper abdominal pain, nausea and vomiting.  Patient was diagnosed with acute pancreatitis with previous pancreatic stent stable in position.  Right upper quadrant ultrasound showed gallstones/sludging.  General surgery and GI were consulted. HIDA showing possible obstruction without CBD dilation versus hepatic dysfunction.  GI recommend ending MRCP   Assessment & Plan:  Principal Problem:   Acute on chronic pancreatitis (HCC) Active Problems:   Uncontrolled type 1 diabetes mellitus with hyperglycemia, with long-term current use of insulin (HCC)   Elevated LFTs   Stricture of pancreatic duct      Acute on chronic pancreatitis, pancreatic ductal stricture Transaminitis, elevated total bilirubin Gallstones with concerns of cholecystitis -CT abdomen pelvis consistent with acute pancreatitis.  Right upper quadrant ultrasound showing gallstones with some sludging.  Autoimmune workup pending, ANA negative, acute hepatitis panel negative, iron studies relatively normal - GI and general surgery consulted.  Pain control, supportive care, antiemetics - HIDA showing possible obstruction without CBD dilation?Marland Kitchen  MRCP ordered - IV fluids, MRCP results pending.    Hypokalemia -As needed repletion.       Insulin-dependent diabetes type 2: Sliding scale and Accu-Cheks   Hyponatremia -From dehydration.  IV fluids    Estimated body mass index is 19.69 kg/m as calculated from the following:   Height as of this encounter: 6\' 5"  (1.956 m).   Weight as of this encounter: 75.3 kg.     DVT prophylaxis: SCD Code Status: Full code Family  Communication:Care discussed with patient.  Disposition Plan:  Status is: Inpatient Remains inpatient appropriate because: management of Transaminases, abdominal pain.       Diet Orders (From admission, onward)     Start     Ordered   04/26/23 0001  Diet NPO time specified  Diet effective midnight        04/25/23 1540            Subjective: Still having abd pain. No nausea vomiting.   Examination: Constitutional: Not in acute distress Respiratory: Clear to auscultation bilaterally Cardiovascular: Normal sinus rhythm, no rubs Abdomen: Nontender nondistended good bowel sounds Musculoskeletal: No edema noted Skin: No rashes seen Neurologic: CN 2-12 grossly intact.  And nonfocal Psychiatric: Normal judgment and insight. Alert and oriented x 3. Normal mood.    Objective: Vitals:   04/25/23 1517 04/25/23 1942 04/26/23 0412 04/26/23 0721  BP: (!) 145/92 (!) 140/98 129/81 120/78  Pulse: 79 80 82 70  Resp: 17 15 15 17   Temp: 98.1 F (36.7 C) 98.4 F (36.9 C) 98.4 F (36.9 C) 98.1 F (36.7 C)  TempSrc: Oral Oral Oral Oral  SpO2: 100% 100% 100% 100%  Weight:      Height:       No intake or output data in the 24 hours ending 04/26/23 0803  Filed Weights   04/22/23 1435 04/24/23 0500 04/25/23 0500  Weight: 79.4 kg 75.3 kg 75 kg    Scheduled Meds:  insulin aspart  0-9 Units Subcutaneous Q4H   insulin glargine-yfgn  5 Units Subcutaneous QHS   pantoprazole (PROTONIX) IV  40 mg Intravenous Q12H   Continuous Infusions:  ampicillin-sulbactam (UNASYN) IV 3 g (04/26/23 0422)   lactated ringers 100 mL/hr at 04/25/23 0436  magnesium sulfate bolus IVPB     potassium chloride      Nutritional status     Body mass index is 19.61 kg/m.  Data Reviewed:   CBC: Recent Labs  Lab 04/22/23 1509 04/23/23 0303 04/24/23 0438 04/25/23 0500 04/26/23 0446  WBC 5.1 5.4 5.8 6.1 6.9  NEUTROABS 3.6  --   --   --   --   HGB 14.0 13.0 12.9* 12.7* 12.3*  HCT 42.3 38.2*  37.8* 36.8* 35.6*  MCV 105.0* 101.1* 100.3* 98.1 97.8  PLT 179 162 152 193 220   Basic Metabolic Panel: Recent Labs  Lab 04/22/23 1509 04/23/23 0303 04/24/23 0438 04/25/23 0459 04/25/23 0500 04/26/23 0446  NA 133* 132* 130*  --  131* 132*  K 3.5 3.4* 3.6  --  2.9* 3.4*  CL 100 98 97*  --  96* 99  CO2 21* 24 19*  --  20* 21*  GLUCOSE 165* 147* 141*  --  159* 186*  BUN 13 9 6   --  <5* <5*  CREATININE 0.74 0.81 0.84  --  0.92 0.86  CALCIUM 9.0 8.5* 8.1*  --  8.4* 8.6*  MG  --  1.5*  --  1.7  --  1.7  PHOS  --   --   --  3.3  --  3.4   GFR: Estimated Creatinine Clearance: 124.8 mL/min (by C-G formula based on SCr of 0.86 mg/dL). Liver Function Tests: Recent Labs  Lab 04/22/23 1509 04/23/23 0303 04/24/23 0438 04/25/23 0500 04/26/23 0446  AST 167* 146* 240* 397* 458*  ALT 97* 86* 95* 161* 225*  ALKPHOS 1,064* 1,098* 1,213* 1,301* 1,219*  BILITOT 5.8* 5.9* 9.4* 13.2* 15.8*  PROT 7.2 6.0* 5.8* 6.0* 6.0*  ALBUMIN 3.2* 2.6* 2.3* 2.3* 2.4*   Recent Labs  Lab 04/22/23 1509  LIPASE 19   No results for input(s): "AMMONIA" in the last 168 hours. Coagulation Profile: Recent Labs  Lab 04/25/23 0734  INR 1.0   Cardiac Enzymes: No results for input(s): "CKTOTAL", "CKMB", "CKMBINDEX", "TROPONINI" in the last 168 hours. BNP (last 3 results) No results for input(s): "PROBNP" in the last 8760 hours. HbA1C: No results for input(s): "HGBA1C" in the last 72 hours. CBG: Recent Labs  Lab 04/25/23 1611 04/25/23 1943 04/26/23 0010 04/26/23 0411 04/26/23 0723  GLUCAP 126* 244* 293* 179* 148*   Lipid Profile: No results for input(s): "CHOL", "HDL", "LDLCALC", "TRIG", "CHOLHDL", "LDLDIRECT" in the last 72 hours. Thyroid Function Tests: No results for input(s): "TSH", "T4TOTAL", "FREET4", "T3FREE", "THYROIDAB" in the last 72 hours. Anemia Panel: Recent Labs    04/25/23 1426  FERRITIN 752*  TIBC 288  IRON 104   Sepsis Labs: No results for input(s): "PROCALCITON",  "LATICACIDVEN" in the last 168 hours.  No results found for this or any previous visit (from the past 240 hour(s)).       Radiology Studies: NM Hepatobiliary Liver Func  Result Date: 04/25/2023 CLINICAL DATA:  Elevated bilirubin.  Concern for cholecystitis. EXAM: NUCLEAR MEDICINE HEPATOBILIARY IMAGING TECHNIQUE: Sequential images of the abdomen were obtained out to 60 minutes following intravenous administration of radiopharmaceutical. RADIOPHARMACEUTICALS:  10.6 mCi Tc-16m  Choletec IV COMPARISON:  04/22/2023 FINDINGS: Prompt uptake of activity by the liver is seen. After 120 minutes of imaging no biliary or small-bowel activity identified with persistent tracer uptake throughout both lobes of the liver. IMPRESSION: 1. No signs of biliary or small bowel activity after 120 minutes of imaging with persistent uptake throughout the liver. Imaging findings  are compatible with either common bile duct obstruction or hepatocyte dysfunction. Electronically Signed   By: Signa Kell M.D.   On: 04/25/2023 15:46           LOS: 4 days   Time spent= 35 mins    Laylaa Guevarra Joline Maxcy, MD [facility]  If 7PM-7AM, please contact night-coverage  04/26/2023, 8:03 AM

## 2023-04-26 NOTE — Progress Notes (Signed)
MRCP results noted.  No evidence of cholecystitis and suspect dilatation in CBD is from chronic pancreatic type findings and not CBD stone.  No plans for surgical intervention.  We will sign off at this time, but are available if the need arises.   Letha Cape 3:31 PM 04/26/2023

## 2023-04-26 NOTE — Plan of Care (Signed)

## 2023-04-26 NOTE — Inpatient Diabetes Management (Addendum)
Inpatient Diabetes Program Recommendations  AACE/ADA: New Consensus Statement on Inpatient Glycemic Control (2015)  Target Ranges:  Prepandial:   less than 140 mg/dL      Peak postprandial:   less than 180 mg/dL (1-2 hours)      Critically ill patients:  140 - 180 mg/dL   Lab Results  Component Value Date   GLUCAP 148 (H) 04/26/2023   HGBA1C 8.6 (H) 04/06/2023    Review of Glycemic Control  Latest Reference Range & Units 04/25/23 16:11 04/25/23 19:43 04/26/23 00:10 04/26/23 04:11 04/26/23 07:23  Glucose-Capillary 70 - 99 mg/dL 161 (H) 096 (H) 045 (H) 179 (H) 148 (H)   Diabetes history: DM 1 Outpatient Diabetes medications:  Omnipod insulin pump with Dexcom G6 Settings:  Basal-0.5 units/hr-12 units daily Insulin carb ratio- 1 units for every 12 carbs Insulin sensitivity- 1 unit drops him 50 mg/dL Goal BG 409 mg/dL Off pump-Lantus 16 units daily with Novolog 5 units tid with meals (CF BG-130/50) Current orders for Inpatient glycemic control:  Semglee 5 units q HS Novolog 0-9 units q 4 hours  Inpatient Diabetes Program Recommendations:    Spoke with patient by phone.  He states that insulin pump was removed on Saturday.  He confirms that his off-pump basal dose is Lantus 16 units daily.  Recommend increasing Semglee to 6 units bid (first dose this AM).    Thanks,  Beryl Meager, RN, BC-ADM Inpatient Diabetes Coordinator Pager [phone]  (8a-5p)

## 2023-04-26 NOTE — Progress Notes (Signed)
Patient in MRI for MRCP.  LFTs continue to uptrend.  Will follow up on MRCP after complete for further thoughts.  Letha Cape 8:47 AM 04/26/2023

## 2023-04-26 NOTE — Progress Notes (Signed)
Subjective: Back from MRI.  Still with some abdominal pain  ROS: See above, otherwise other systems negative  Objective: Vital signs in last 24 hours: Temp:  [98.1 F (36.7 C)-98.4 F (36.9 C)] 98.3 F (36.8 C) (06/18 0903) Pulse Rate:  [70-82] 79 (06/18 0903) Resp:  [15-17] 17 (06/18 0903) BP: (120-145)/(78-98) 128/91 (06/18 0903) SpO2:  [100 %] 100 % (06/18 0903) Last BM Date : 04/24/23  Intake/Output from previous day: No intake/output data recorded. Intake/Output this shift: No intake/output data recorded.  PE: Gen: NAD Abd: soft, mild RUQ and epigastric tenderness, +BS, ND  Lab Results:  Recent Labs    04/25/23 0500 04/26/23 0446  WBC 6.1 6.9  HGB 12.7* 12.3*  HCT 36.8* 35.6*  PLT 193 220   BMET Recent Labs    04/25/23 0500 04/26/23 0446  NA 131* 132*  K 2.9* 3.4*  CL 96* 99  CO2 20* 21*  GLUCOSE 159* 186*  BUN <5* <5*  CREATININE 0.92 0.86  CALCIUM 8.4* 8.6*   PT/INR Recent Labs    04/25/23 0734  LABPROT 13.8  INR 1.0   CMP     Component Value Date/Time   NA 132 (L) 04/26/2023 0446   K 3.4 (L) 04/26/2023 0446   CL 99 04/26/2023 0446   CO2 21 (L) 04/26/2023 0446   GLUCOSE 186 (H) 04/26/2023 0446   BUN <5 (L) 04/26/2023 0446   CREATININE 0.86 04/26/2023 0446   CREATININE 0.89 08/02/2022 0914   CALCIUM 8.6 (L) 04/26/2023 0446   PROT 6.0 (L) 04/26/2023 0446   ALBUMIN 2.4 (L) 04/26/2023 0446   AST 458 (H) 04/26/2023 0446   ALT 225 (H) 04/26/2023 0446   ALKPHOS 1,219 (H) 04/26/2023 0446   BILITOT 15.8 (H) 04/26/2023 0446   GFRNONAA >60 04/26/2023 0446   GFRNONAA 85 07/03/2019 1435   GFRAA  10/01/2020 1400    QUESTIONABLE RESULTS, RECOMMEND RECOLLECT TO VERIFY   GFRAA 98 07/03/2019 1435   Lipase     Component Value Date/Time   LIPASE 19 04/22/2023 1509       Studies/Results: NM Hepatobiliary Liver Func  Result Date: 04/25/2023 CLINICAL DATA:  Elevated bilirubin.  Concern for cholecystitis. EXAM: NUCLEAR MEDICINE  HEPATOBILIARY IMAGING TECHNIQUE: Sequential images of the abdomen were obtained out to 60 minutes following intravenous administration of radiopharmaceutical. RADIOPHARMACEUTICALS:  10.6 mCi Tc-104m  Choletec IV COMPARISON:  04/22/2023 FINDINGS: Prompt uptake of activity by the liver is seen. After 120 minutes of imaging no biliary or small-bowel activity identified with persistent tracer uptake throughout both lobes of the liver. IMPRESSION: 1. No signs of biliary or small bowel activity after 120 minutes of imaging with persistent uptake throughout the liver. Imaging findings are compatible with either common bile duct obstruction or hepatocyte dysfunction. Electronically Signed   By: Signa Kell M.D.   On: 04/25/2023 15:46    Anti-infectives: Anti-infectives (From admission, onward)    Start     Dose/Rate Route Frequency Ordered Stop   04/23/23 0800  Ampicillin-Sulbactam (UNASYN) 3 g in sodium chloride 0.9 % 100 mL IVPB        3 g 200 mL/hr over 30 Minutes Intravenous Every 6 hours 04/23/23 0723          Assessment/Plan Abdominal pain with significantly elevated TB, alkphos, and transaminitis, history of chronic pancreatitis, ? Cholecystitis -the patient has a history of a pancreatic stent. -worsening LFTs today -HIDA reviewed and either CBD obstruction or hepatocyte dysfunction. -MRCP pending.  Gb appears long, but generally normal.  Will await read for further evaluation of liver and biliary tree, etc -pattern is less c/w cholecystitis and a primary liver/obstructive pattern.  Will follow for MRCP results  FEN - may have a diet from surgery standpoint VTE - none ID - unasyn  DM H/O Splenic vein thrombosis  I reviewed Consultant GI notes, hospitalist notes, last 24 h vitals and pain scores, last 48 h intake and output, last 24 h labs and trends, and last 24 h imaging results.   LOS: 4 days    Letha Cape , [facility] Surgery 04/26/2023, 10:37 AM Please see  Amion for pager number during day hours 7:00am-4:30pm or 7:00am -11:30am on weekends

## 2023-04-27 DIAGNOSIS — K861 Other chronic pancreatitis: Secondary | ICD-10-CM | POA: Diagnosis not present

## 2023-04-27 DIAGNOSIS — K859 Acute pancreatitis without necrosis or infection, unspecified: Secondary | ICD-10-CM | POA: Diagnosis not present

## 2023-04-27 LAB — COMPREHENSIVE METABOLIC PANEL
ALT: 212 U/L — ABNORMAL HIGH (ref 0–44)
AST: 280 U/L — ABNORMAL HIGH (ref 15–41)
Albumin: 2.2 g/dL — ABNORMAL LOW (ref 3.5–5.0)
Alkaline Phosphatase: 1050 U/L — ABNORMAL HIGH (ref 38–126)
Anion gap: 7 (ref 5–15)
BUN: <5 mg/dL — ABNORMAL LOW (ref 6–20)
CO2: 24 mmol/L (ref 22–32)
Calcium: 8.5 mg/dL — ABNORMAL LOW (ref 8.9–10.3)
Chloride: 101 mmol/L (ref 98–111)
Creatinine, Ser: 0.72 mg/dL (ref 0.61–1.24)
GFR, Estimated: >60 mL/min (ref >60)
Glucose, Bld: 136 mg/dL — ABNORMAL HIGH (ref 70–99)
Potassium: 3.4 mmol/L — ABNORMAL LOW (ref 3.5–5.1)
Sodium: 132 mmol/L — ABNORMAL LOW (ref 135–145)
Total Bilirubin: 14.1 mg/dL — ABNORMAL HIGH (ref 0.3–1.2)
Total Protein: 5.7 g/dL — ABNORMAL LOW (ref 6.5–8.1)

## 2023-04-27 LAB — GLUCOSE, CAPILLARY
Glucose-Capillary: 178 mg/dL — ABNORMAL HIGH (ref 70–99)
Glucose-Capillary: 130 mg/dL — ABNORMAL HIGH (ref 70–99)
Glucose-Capillary: 123 mg/dL — ABNORMAL HIGH (ref 70–99)
Glucose-Capillary: 237 mg/dL — ABNORMAL HIGH (ref 70–99)
Glucose-Capillary: 241 mg/dL — ABNORMAL HIGH (ref 70–99)
Glucose-Capillary: 238 mg/dL — ABNORMAL HIGH (ref 70–99)

## 2023-04-27 LAB — CBC
HCT: 31.5 % — ABNORMAL LOW (ref 39.0–52.0)
Hemoglobin: 11.3 g/dL — ABNORMAL LOW (ref 13.0–17.0)
MCH: 34.5 pg — ABNORMAL HIGH (ref 26.0–34.0)
MCHC: 35.9 g/dL (ref 30.0–36.0)
MCV: 96 fL (ref 80.0–100.0)
Platelets: 236 10*3/uL (ref 150–400)
RBC: 3.28 MIL/uL — ABNORMAL LOW (ref 4.22–5.81)
RDW: 13.9 % (ref 11.5–15.5)
WBC: 6.7 10*3/uL (ref 4.0–10.5)
nRBC: 0 % (ref 0.0–0.2)

## 2023-04-27 LAB — MAGNESIUM: Magnesium: 1.8 mg/dL (ref 1.7–2.4)

## 2023-04-27 MED ORDER — PANCRELIPASE (LIP-PROT-AMYL) 36000-114000 UNITS PO CPEP
72000.0000 [IU] | ORAL_CAPSULE | Freq: Three times a day (TID) | ORAL | Status: DC
  Administered 2023-04-27 – 2023-04-30 (×8): 72000 [IU] via ORAL
  Filled 2023-04-27 (×8): qty 2

## 2023-04-27 MED ORDER — PANCRELIPASE (LIP-PROT-AMYL) 36000-114000 UNITS PO CPEP
36000.0000 [IU] | ORAL_CAPSULE | ORAL | Status: DC
  Administered 2023-04-27 – 2023-04-30 (×6): 36000 [IU] via ORAL
  Filled 2023-04-27 (×5): qty 1

## 2023-04-27 NOTE — Progress Notes (Signed)
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-erosive esophagitis Candida hiatal hernia EUS chronic pancreatitis parenchymal loss causing cyst gastrostomy/enterostomy to be unsafe to be performed at that time-ultimately Last ERCP 02/02/2023 removal of occluded PD stent removal of PD stones 2 plastic stents across to stricture Anxiety Carlos [name] presented [facility] ED 2 weeks RUQ pain worsening significantly + nausea + vomiting + orange urine Alk phos thousand 64 bilirubin 5.8 lipase 19 AST/ALT 167/97 General surgery and GI consulted 6/17 HIDA = no signs of biliary or small bowel activity after 120 minutes of imaging?  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 6/20 Resumed all Creon doses  Mild hypokalemia-replaced previously-monitor-check a.m. mag, replace orally  Diabetes mellitus TY 2--insulin pump at home?  Continuous glucometer as well Dexcom-baseline long-acting is 16 units with sliding scale CBGs ranging 120s to 240s Monitor trends if continue to be above 250 will need to increase Semglee insulin  Prior splenic venous thrombosis Not on anticoagulation any longer  Probable chronic pain from chronic  pancreatitis Previously has been on Dilaudid 4 mg every 4 as needed severe pain-needs to get these prescription filled by Dr. Hyacinth Meeker on discharge  moderate malnutrition BMI 19  DVT prophylaxis: SCD Code Status: Full Family Communication: Discussed with girlfriend at the bedside Disposition:  Status is: Inpatient Remains inpatient appropriate because: Requires further inpatient management and care    Subjective: Awake coherent-he is sitting up without issues Just took a shower-able to tolerate soup-no nausea no vomiting today although complains of continued pain-wonders why he has recurrent pancreatitis  Objective: Vitals:   04/26/23 2046 04/27/23 0414 04/27/23 0727 04/27/23 1603  BP: (!) 136/95 120/87 122/80 (!) 169/106  Pulse: 67 75 75 83  Resp: 18 18 16 18   Temp: 98.4 F (36.9 C) 98 F (36.7 C) 97.9 F (36.6 C) 100 F (37.8 C)  TempSrc:   Oral Oral  SpO2: 100% 100% 100% 100%  Weight:      Height:        Intake/Output Summary (Last 24 hours) at 04/27/2023 1723 Last data filed at 04/26/2023 2000 Gross per 24 hour  Intake --  Output 400 ml  Net -400 ml   Filed Weights   04/22/23 1435 04/24/23 0500 04/25/23 0500  Weight: 79.4 kg 75.3 kg 75 kg    Examination:  No icterus no pallor Chest clear no wheeze rales rhonchi Epigastric tenderness++ Quite frail thin appearing black male Some fullness in the neck bilaterally No lower extremity edema   Data Reviewed: personally reviewed   CBC    Component Value Date/Time   WBC 6.7 04/27/2023 0254   RBC 3.28 (L) 04/27/2023 0254   HGB 11.3 (L) 04/27/2023 0254   HCT 31.5 (L) 04/27/2023 0254   PLT 236 04/27/2023 0254   MCV 96.0 04/27/2023 0254  MCH 34.5 (H) 04/27/2023 0254   MCHC 35.9 04/27/2023 0254   RDW 13.9 04/27/2023 0254   LYMPHSABS 0.6 (L) 04/22/2023 1509   MONOABS 0.7 04/22/2023 1509   EOSABS 0.1 04/22/2023 1509   BASOSABS 0.0 04/22/2023 1509      Latest Ref Rng & Units 04/27/2023    2:54 AM 04/26/2023     4:46 AM 04/25/2023    5:00 AM  CMP  Glucose 70 - 99 mg/dL 811  914  782   BUN 6 - 20 mg/dL <5  <5  <5   Creatinine 0.61 - 1.24 mg/dL 9.56  2.13  0.86   Sodium 135 - 145 mmol/L 132  132  131   Potassium 3.5 - 5.1 mmol/L 3.4  3.4  2.9   Chloride 98 - 111 mmol/L 101  99  96   CO2 22 - 32 mmol/L 24  21  20    Calcium 8.9 - 10.3 mg/dL 8.5  8.6  8.4   Total Protein 6.5 - 8.1 g/dL 5.7  6.0  6.0   Total Bilirubin 0.3 - 1.2 mg/dL 57.8  46.9  62.9   Alkaline Phos 38 - 126 U/L 1,050  1,219  1,301   AST 15 - 41 U/L 280  458  397   ALT 0 - 44 U/L 212  225  161      Radiology Studies: MR ABDOMEN MRCP W WO CONTAST  Result Date: 04/26/2023 CLINICAL DATA:  Elevated liver function tests. Jaundice. History of alcoholism, diabetes and chronic pancreatitis. Pancreatic duct stricture with stent. History of splenic vein thrombosis. EXAM: MRI ABDOMEN WITHOUT AND WITH CONTRAST (INCLUDING MRCP) TECHNIQUE: Multiplanar multisequence MR imaging of the abdomen was performed both before and after the administration of intravenous contrast. Heavily T2-weighted images of the biliary and pancreatic ducts were obtained, and three-dimensional MRCP images were rendered by post processing. CONTRAST:  7mL GADAVIST GADOBUTROL 1 MMOL/ML IV SOLN COMPARISON:  HIDA scan 04/25/2023. Ultrasound and CT 04/22/2023. Multiple older exams as well. MRI January 2024. FINDINGS: Lower chest: Trace pleural fluid bilaterally. Borderline size heart. Hepatobiliary: There is mild diffuse intrahepatic biliary ductal dilatation which is progressive from MRI of January 2024. Diameter of the common duct proximally approaches 6-7 mm. More distally as the duct approaches the pancreatic head there is an abrupt cutoff of the duct, presumed significant stricture. On postcontrast there is some wall thickening enhancement at the transition. There is also a cystic area just caudal to the narrowing of the duct in the pancreatic head measuring 2.1 x 1.5 cm. This  has not seen in the MRI of January 2024. This was seen on the recent CT scan. The liver itself portal vein is patent but there is also several collaterals in the porta hepatis. Please correlate with previous venous occlusion with some cavernous transformation and collaterals. No enhancing liver lesion. No restricted diffusion of the liver. Gallbladder is dilated.  Layering sludge.  Small stones. Pancreas: Pancreatic ductal dilatation towards the body/tail region measuring up to 6 mm, severe. Caliber change along the midbody and neck region with a stricture. On recent CT scan there is a pancreatic duct stent which is not as well appreciated on this MRI but likely present based on the appearance. As described above there is a complex fluid collection with septa along the head of the pancreas. Please correlate for developing pseudocysts. Otherwise the pancreas on T2 is diffusely edematous with bright T2 signal and heterogeneous enhancement. There are areas of atrophy as well. Adjacent  stranding. Please correlate for any component of acute on chronic pancreatitis. Spleen:  Within normal limits in size and appearance. Adrenals/Urinary Tract: No masses identified. No evidence of hydronephrosis. Stomach/Bowel: The visualized bowel is nondilated. There are some scattered stool. The stomach has areas of wall thickening along the body and antrum. Slight wall thickening as well along the area of the duodenal. Vascular/Lymphatic: Normal caliber aorta with some mild atherosclerotic plaque. Normal caliber IVC. There are several upper abdominal nodes. Example node in the porta hepatis measures 2.2 by 1.4 cm. Other nodes are borderline to slightly enlarged. There is narrowing of the main portal vein proximally near the portal venous confluence. The several collateral vessels throughout the retroperitoneum and upper abdomen. This includes porta hepatis, central mesentery and gastric cascade. Other:  Mesenteric stranding with trace  ascites. Musculoskeletal: No suspicious bone lesions identified. IMPRESSION: Diffuse edema of the pancreas with atrophy. Adjacent stranding as well. Please correlate for any clinical evidence of acute on chronic pancreatitis. The calcification seen by prior CT are less appreciated today. History of an indwelling pancreatic duct stent. There is severe dilatation of the duct towards the tail with a central stricture with a stent in place. Worsening biliary ductal dilatation with abrupt caliber change of the distal common duct as the duct enters the pancreatic head. In this location there is an adjacent complex cystic lesion, possibly evolving pseudocyst. True severe common duct stricture is possible either from mass effect or true intrinsic narrowing. There is some enhancing wall to the duct in this location. Please correlate with findings at ERCP. Distended/dilated gallbladder with sludge and stones. Several collateral vessels throughout the upper abdomen including the porta hepatis, central mesentery in the left upper quadrant with the areas of narrowing of the main portal vein near the portal venous confluence. Please correlate with previous venous occlusion. Several prominent to mildly enlarged upper abdominal lymph nodes. Attention on follow-up. Electronically Signed   By: Karen Kays M.D.   On: 04/26/2023 11:39     Scheduled Meds:  insulin aspart  0-9 Units Subcutaneous Q4H   insulin glargine-yfgn  6 Units Subcutaneous BID   lipase/protease/amylase  36,000 Units Oral With snacks   lipase/protease/amylase  72,000 Units Oral TID WC   pantoprazole (PROTONIX) IV  40 mg Intravenous Q12H   Continuous Infusions:  ampicillin-sulbactam (UNASYN) IV 3 g (04/27/23 1235)     LOS: 5 days   Time spent: 62  Rhetta Mura, MD [facility] To contact the attending provider between 7A-7P or the covering provider during after hours 7P-7A, please log into the web site [email or web address] and access using  universal [place] password for that web site. If you do not have the password, please call the hospital operator.  04/27/2023, 5:23 PM

## 2023-04-27 NOTE — H&P (View-Only) (Signed)
Carlos [name] Progress Note  CC:  Elevated LFTs and right upper quadrant pain    Subjective: No nausea or vomiting.  Tolerating a full liquid diet.  He continues to have epigastric pain which is fairly well-controlled.  No lower abdominal pain.  He passed a light brown bowel movement yesterday, no rectal bleeding or black stools.  No chest pain or shortness of breath.   Objective:  Vital signs in last 24 hours: Temp:  [97.9 F (36.6 C)-98.4 F (36.9 C)] 97.9 F (36.6 C) (06/19 0727) Pulse Rate:  [67-75] 75 (06/19 0727) Resp:  [16-18] 16 (06/19 0727) BP: (120-136)/(80-95) 122/80 (06/19 0727) SpO2:  [100 %] 100 % (06/19 0727) Last BM Date : 04/24/23 General: Alert 37 year old male in no acute distress. Eyes: Scleral icterus present. Heart: Regular rate and rhythm, no murmurs. Pulm: Sounds clear throughout. Abdomen: Mild epigastric and RUQ tenderness without rebound or guarding.  Positive bowel sounds to all 4 quadrants.  No hepatosplenomegaly. Extremities:  Without edema. Neurologic:  Alert and  oriented x 4. Grossly normal neurologically. Psych:  Alert and cooperative. Normal mood and affect.  Intake/Output from previous day: 06/18 0701 - 06/19 0700 In: 236 [P.O.:236] Out: 400 [Urine:400] Intake/Output this shift: No intake/output data recorded.  Lab Results: Recent Labs    04/25/23 0500 04/26/23 0446 04/27/23 0254  WBC 6.1 6.9 6.7  HGB 12.7* 12.3* 11.3*  HCT 36.8* 35.6* 31.5*  PLT 193 220 236   BMET Recent Labs    04/25/23 0500 04/26/23 0446 04/27/23 0254  NA 131* 132* 132*  K 2.9* 3.4* 3.4*  CL 96* 99 101  CO2 20* 21* 24  GLUCOSE 159* 186* 136*  BUN <5* <5* <5*  CREATININE 0.92 0.86 0.72  CALCIUM 8.4* 8.6* 8.5*   LFT Recent Labs    04/27/23 0254  PROT 5.7*  ALBUMIN 2.2*  AST 280*  ALT 212*  ALKPHOS 1,050*  BILITOT 14.1*   PT/INR Recent Labs    04/25/23 0734  LABPROT 13.8  INR 1.0   Hepatitis Panel Recent Labs     04/25/23 1426  HEPBSAG NON REACTIVE  HCVAB NON REACTIVE  HEPAIGM NON REACTIVE  HEPBIGM NON REACTIVE    MR ABDOMEN MRCP W WO CONTAST  Result Date: 04/26/2023 CLINICAL DATA:  Elevated liver function tests. Jaundice. History of alcoholism, diabetes and chronic pancreatitis. Pancreatic duct stricture with stent. History of splenic vein thrombosis. EXAM: MRI ABDOMEN WITHOUT AND WITH CONTRAST (INCLUDING MRCP) TECHNIQUE: Multiplanar multisequence MR imaging of the abdomen was performed both before and after the administration of intravenous contrast. Heavily T2-weighted images of the biliary and pancreatic ducts were obtained, and three-dimensional MRCP images were rendered by post processing. CONTRAST:  7mL GADAVIST GADOBUTROL 1 MMOL/ML IV SOLN COMPARISON:  HIDA scan 04/25/2023. Ultrasound and CT 04/22/2023. Multiple older exams as well. MRI January 2024. FINDINGS: Lower chest: Trace pleural fluid bilaterally. Borderline size heart. Hepatobiliary: There is mild diffuse intrahepatic biliary ductal dilatation which is progressive from MRI of January 2024. Diameter of the common duct proximally approaches 6-7 mm. More distally as the duct approaches the pancreatic head there is an abrupt cutoff of the duct, presumed significant stricture. On postcontrast there is some wall thickening enhancement at the transition. There is also a cystic area just caudal to the narrowing of the duct in the pancreatic head measuring 2.1 x 1.5 cm. This has not seen in the MRI of January 2024. This was seen on the recent CT scan. The liver itself  portal vein is patent but there is also several collaterals in the porta hepatis. Please correlate with previous venous occlusion with some cavernous transformation and collaterals. No enhancing liver lesion. No restricted diffusion of the liver. Gallbladder is dilated.  Layering sludge.  Small stones. Pancreas: Pancreatic ductal dilatation towards the body/tail region measuring up to 6 mm,  severe. Caliber change along the midbody and neck region with a stricture. On recent CT scan there is a pancreatic duct stent which is not as well appreciated on this MRI but likely present based on the appearance. As described above there is a complex fluid collection with septa along the head of the pancreas. Please correlate for developing pseudocysts. Otherwise the pancreas on T2 is diffusely edematous with bright T2 signal and heterogeneous enhancement. There are areas of atrophy as well. Adjacent stranding. Please correlate for any component of acute on chronic pancreatitis. Spleen:  Within normal limits in size and appearance. Adrenals/Urinary Tract: No masses identified. No evidence of hydronephrosis. Stomach/Bowel: The visualized bowel is nondilated. There are some scattered stool. The stomach has areas of wall thickening along the body and antrum. Slight wall thickening as well along the area of the duodenal. Vascular/Lymphatic: Normal caliber aorta with some mild atherosclerotic plaque. Normal caliber IVC. There are several upper abdominal nodes. Example node in the porta hepatis measures 2.2 by 1.4 cm. Other nodes are borderline to slightly enlarged. There is narrowing of the main portal vein proximally near the portal venous confluence. The several collateral vessels throughout the retroperitoneum and upper abdomen. This includes porta hepatis, central mesentery and gastric cascade. Other:  Mesenteric stranding with trace ascites. Musculoskeletal: No suspicious bone lesions identified. IMPRESSION: Diffuse edema of the pancreas with atrophy. Adjacent stranding as well. Please correlate for any clinical evidence of acute on chronic pancreatitis. The calcification seen by prior CT are less appreciated today. History of an indwelling pancreatic duct stent. There is severe dilatation of the duct towards the tail with a central stricture with a stent in place. Worsening biliary ductal dilatation with abrupt  caliber change of the distal common duct as the duct enters the pancreatic head. In this location there is an adjacent complex cystic lesion, possibly evolving pseudocyst. True severe common duct stricture is possible either from mass effect or true intrinsic narrowing. There is some enhancing wall to the duct in this location. Please correlate with findings at ERCP. Distended/dilated gallbladder with sludge and stones. Several collateral vessels throughout the upper abdomen including the porta hepatis, central mesentery in the left upper quadrant with the areas of narrowing of the main portal vein near the portal venous confluence. Please correlate with previous venous occlusion. Several prominent to mildly enlarged upper abdominal lymph nodes. Attention on follow-up. Electronically Signed   By: Karen Kays M.D.   On: 04/26/2023 11:39   NM Hepatobiliary Liver Func  Result Date: 04/25/2023 CLINICAL DATA:  Elevated bilirubin.  Concern for cholecystitis. EXAM: NUCLEAR MEDICINE HEPATOBILIARY IMAGING TECHNIQUE: Sequential images of the abdomen were obtained out to 60 minutes following intravenous administration of radiopharmaceutical. RADIOPHARMACEUTICALS:  10.6 mCi Tc-65m  Choletec IV COMPARISON:  04/22/2023 FINDINGS: Prompt uptake of activity by the liver is seen. After 120 minutes of imaging no biliary or small-bowel activity identified with persistent tracer uptake throughout both lobes of the liver. IMPRESSION: 1. No signs of biliary or small bowel activity after 120 minutes of imaging with persistent uptake throughout the liver. Imaging findings are compatible with either common bile duct obstruction or hepatocyte dysfunction.  Electronically Signed   By: Signa Kell M.D.   On: 04/25/2023 15:46    Assessment / Plan:  37 year old male with a history of chronic pancreatitis with PD stricture s/p PD stent with history of alcohol use disorder (abstinent from alcohol since 09/2020). MRI/MRCP 04/25/2023 showed  acute-on-chronic pancreatitis with severe dilation of the pancreatic duct towards the tail with a central stricture with a stent in place with worsening biliary ductal dilation with a caliber change of the distal common bile duct as the duct enters the pancreatic head with an adjacent complex eccentric lesion possibly evolving pseudocyst. Normal IgG level.  IgG4 is pending currently.  -Full liquid diet today today -NPO after midnight -ERCP tomorrow with Dr. [name] -Pain management IV fluids per the hospitalist  RUQ abdominal pain with elevated LFTs.  RUQ sonogram showed gallstones with layering sludge. HIDA scan consistent with CBD obstruction versus hepatocyte dysfunction.  Abdominal MRI/RMCP showed a distended gallbladder with sludge and stones. It is possible that the patient's pancreatitis is contributing to the development of a CBD obstruction. AMA of 20.4, normal ASMA, normal IgG, negative ANA, negative acute hepatitis panel, and normal iron saturation ratio.  Evaluated by general surgery, MRCP without evidence of acute cholecystitis therefore surgical intervention not recommended at this juncture.  LFTs downtrending. T. Bili 15.8 -> 14.1. Alk phos 1,219 -> 1,050. AST 458 -> 280. ALT 225 -> 212.  -EGD/ERCP tomorrow with Dr. [name] -Continue PPI IV twice daily -Hepatic panel in a.m.  Hyponatremia, hypokalemia -Management per the hospitalist -BMP in a.m.  Hepatic steatosis per CTAP -Recommend follow-up as an outpatient    Principal Problem:   Acute on chronic pancreatitis (HCC) Active Problems:   Elevated LFTs   Uncontrolled type 1 diabetes mellitus with hyperglycemia, with long-term current use of insulin (HCC)   Stricture of pancreatic duct     LOS: 5 days   Carlos [name]  04/27/2023, 12:28PM

## 2023-04-27 NOTE — Plan of Care (Signed)

## 2023-04-27 NOTE — Progress Notes (Signed)
   [facility] Progress Note  CC:  Elevated LFTs and right upper quadrant pain    Subjective: No nausea or vomiting.  Tolerating a full liquid diet.  He continues to have epigastric pain which is fairly well-controlled.  No lower abdominal pain.  He passed a light brown bowel movement yesterday, no rectal bleeding or black stools.  No chest pain or shortness of breath.   Objective:  Vital signs in last 24 hours: Temp:  [97.9 F (36.6 C)-98.4 F (36.9 C)] 97.9 F (36.6 C) (06/19 0727) Pulse Rate:  [67-75] 75 (06/19 0727) Resp:  [16-18] 16 (06/19 0727) BP: (120-136)/(80-95) 122/80 (06/19 0727) SpO2:  [100 %] 100 % (06/19 0727) Last BM Date : 04/24/23 General: Alert 37-year-old male in no acute distress. Eyes: Scleral icterus present. Heart: Regular rate and rhythm, no murmurs. Pulm: Sounds clear throughout. Abdomen: Mild epigastric and RUQ tenderness without rebound or guarding.  Positive bowel sounds to all 4 quadrants.  No hepatosplenomegaly. Extremities:  Without edema. Neurologic:  Alert and  oriented x 4. Grossly normal neurologically. Psych:  Alert and cooperative. Normal mood and affect.  Intake/Output from previous day: 06/18 0701 - 06/19 0700 In: 236 [P.O.:236] Out: 400 [Urine:400] Intake/Output this shift: No intake/output data recorded.  Lab Results: Recent Labs    04/25/23 0500 04/26/23 0446 04/27/23 0254  WBC 6.1 6.9 6.7  HGB 12.7* 12.3* 11.3*  HCT 36.8* 35.6* 31.5*  PLT 193 220 236   BMET Recent Labs    04/25/23 0500 04/26/23 0446 04/27/23 0254  NA 131* 132* 132*  K 2.9* 3.4* 3.4*  CL 96* 99 101  CO2 20* 21* 24  GLUCOSE 159* 186* 136*  BUN <5* <5* <5*  CREATININE 0.92 0.86 0.72  CALCIUM 8.4* 8.6* 8.5*   LFT Recent Labs    04/27/23 0254  PROT 5.7*  ALBUMIN 2.2*  AST 280*  ALT 212*  ALKPHOS 1,050*  BILITOT 14.1*   PT/INR Recent Labs    04/25/23 0734  LABPROT 13.8  INR 1.0   Hepatitis Panel Recent Labs     04/25/23 1426  HEPBSAG NON REACTIVE  HCVAB NON REACTIVE  HEPAIGM NON REACTIVE  HEPBIGM NON REACTIVE    MR ABDOMEN MRCP W WO CONTAST  Result Date: 04/26/2023 CLINICAL DATA:  Elevated liver function tests. Jaundice. History of alcoholism, diabetes and chronic pancreatitis. Pancreatic duct stricture with stent. History of splenic vein thrombosis. EXAM: MRI ABDOMEN WITHOUT AND WITH CONTRAST (INCLUDING MRCP) TECHNIQUE: Multiplanar multisequence MR imaging of the abdomen was performed both before and after the administration of intravenous contrast. Heavily T2-weighted images of the biliary and pancreatic ducts were obtained, and three-dimensional MRCP images were rendered by post processing. CONTRAST:  7mL GADAVIST GADOBUTROL 1 MMOL/ML IV SOLN COMPARISON:  HIDA scan 04/25/2023. Ultrasound and CT 04/22/2023. Multiple older exams as well. MRI January 2024. FINDINGS: Lower chest: Trace pleural fluid bilaterally. Borderline size heart. Hepatobiliary: There is mild diffuse intrahepatic biliary ductal dilatation which is progressive from MRI of January 2024. Diameter of the common duct proximally approaches 6-7 mm. More distally as the duct approaches the pancreatic head there is an abrupt cutoff of the duct, presumed significant stricture. On postcontrast there is some wall thickening enhancement at the transition. There is also a cystic area just caudal to the narrowing of the duct in the pancreatic head measuring 2.1 x 1.5 cm. This has not seen in the MRI of January 2024. This was seen on the recent CT scan. The liver itself   portal vein is patent but there is also several collaterals in the porta hepatis. Please correlate with previous venous occlusion with some cavernous transformation and collaterals. No enhancing liver lesion. No restricted diffusion of the liver. Gallbladder is dilated.  Layering sludge.  Small stones. Pancreas: Pancreatic ductal dilatation towards the body/tail region measuring up to 6 mm,  severe. Caliber change along the midbody and neck region with a stricture. On recent CT scan there is a pancreatic duct stent which is not as well appreciated on this MRI but likely present based on the appearance. As described above there is a complex fluid collection with septa along the head of the pancreas. Please correlate for developing pseudocysts. Otherwise the pancreas on T2 is diffusely edematous with bright T2 signal and heterogeneous enhancement. There are areas of atrophy as well. Adjacent stranding. Please correlate for any component of acute on chronic pancreatitis. Spleen:  Within normal limits in size and appearance. Adrenals/Urinary Tract: No masses identified. No evidence of hydronephrosis. Stomach/Bowel: The visualized bowel is nondilated. There are some scattered stool. The stomach has areas of wall thickening along the body and antrum. Slight wall thickening as well along the area of the duodenal. Vascular/Lymphatic: Normal caliber aorta with some mild atherosclerotic plaque. Normal caliber IVC. There are several upper abdominal nodes. Example node in the porta hepatis measures 2.2 by 1.4 cm. Other nodes are borderline to slightly enlarged. There is narrowing of the main portal vein proximally near the portal venous confluence. The several collateral vessels throughout the retroperitoneum and upper abdomen. This includes porta hepatis, central mesentery and gastric cascade. Other:  Mesenteric stranding with trace ascites. Musculoskeletal: No suspicious bone lesions identified. IMPRESSION: Diffuse edema of the pancreas with atrophy. Adjacent stranding as well. Please correlate for any clinical evidence of acute on chronic pancreatitis. The calcification seen by prior CT are less appreciated today. History of an indwelling pancreatic duct stent. There is severe dilatation of the duct towards the tail with a central stricture with a stent in place. Worsening biliary ductal dilatation with abrupt  caliber change of the distal common duct as the duct enters the pancreatic head. In this location there is an adjacent complex cystic lesion, possibly evolving pseudocyst. True severe common duct stricture is possible either from mass effect or true intrinsic narrowing. There is some enhancing wall to the duct in this location. Please correlate with findings at ERCP. Distended/dilated gallbladder with sludge and stones. Several collateral vessels throughout the upper abdomen including the porta hepatis, central mesentery in the left upper quadrant with the areas of narrowing of the main portal vein near the portal venous confluence. Please correlate with previous venous occlusion. Several prominent to mildly enlarged upper abdominal lymph nodes. Attention on follow-up. Electronically Signed   By: Ashok  Gupta M.D.   On: 04/26/2023 11:39   NM Hepatobiliary Liver Func  Result Date: 04/25/2023 CLINICAL DATA:  Elevated bilirubin.  Concern for cholecystitis. EXAM: NUCLEAR MEDICINE HEPATOBILIARY IMAGING TECHNIQUE: Sequential images of the abdomen were obtained out to 60 minutes following intravenous administration of radiopharmaceutical. RADIOPHARMACEUTICALS:  10.6 mCi Tc-99m  Choletec IV COMPARISON:  04/22/2023 FINDINGS: Prompt uptake of activity by the liver is seen. After 120 minutes of imaging no biliary or small-bowel activity identified with persistent tracer uptake throughout both lobes of the liver. IMPRESSION: 1. No signs of biliary or small bowel activity after 120 minutes of imaging with persistent uptake throughout the liver. Imaging findings are compatible with either common bile duct obstruction or hepatocyte dysfunction.   Electronically Signed   By: Taylor  Stroud M.D.   On: 04/25/2023 15:46    Assessment / Plan:  37-year-old male with a history of chronic pancreatitis with PD stricture s/p PD stent with history of alcohol use disorder (abstinent from alcohol since 09/2020). MRI/MRCP 04/25/2023 showed  acute-on-chronic pancreatitis with severe dilation of the pancreatic duct towards the tail with a central stricture with a stent in place with worsening biliary ductal dilation with a caliber change of the distal common bile duct as the duct enters the pancreatic head with an adjacent complex eccentric lesion possibly evolving pseudocyst. Normal IgG level.  IgG4 is pending currently.  -Full liquid diet today today -NPO after midnight -ERCP tomorrow with Dr. Stark -Pain management IV fluids per the hospitalist  RUQ abdominal pain with elevated LFTs.  RUQ sonogram showed gallstones with layering sludge. HIDA scan consistent with CBD obstruction versus hepatocyte dysfunction.  Abdominal MRI/RMCP showed a distended gallbladder with sludge and stones. It is possible that the patient's pancreatitis is contributing to the development of a CBD obstruction. AMA of 20.4, normal ASMA, normal IgG, negative ANA, negative acute hepatitis panel, and normal iron saturation ratio.  Evaluated by general surgery, MRCP without evidence of acute cholecystitis therefore surgical intervention not recommended at this juncture.  LFTs downtrending. T. Bili 15.8 -> 14.1. Alk phos 1,219 -> 1,050. AST 458 -> 280. ALT 225 -> 212.  -EGD/ERCP tomorrow with Dr. Stark -Continue PPI IV twice daily -Hepatic panel in a.m.  Hyponatremia, hypokalemia -Management per the hospitalist -BMP in a.m.  Hepatic steatosis per CTAP -Recommend follow-up as an outpatient    Principal Problem:   Acute on chronic pancreatitis (HCC) Active Problems:   Elevated LFTs   Uncontrolled type 1 diabetes mellitus with hyperglycemia, with long-term current use of insulin (HCC)   Stricture of pancreatic duct     LOS: 5 days   Alayziah Tangeman M Kennedy-Smith  04/27/2023, 12:28PM    

## 2023-04-28 ENCOUNTER — Inpatient Hospital Stay (HOSPITAL_COMMUNITY): Payer: Medicaid Other

## 2023-04-28 ENCOUNTER — Inpatient Hospital Stay (HOSPITAL_COMMUNITY): Payer: Medicaid Other | Admitting: Anesthesiology

## 2023-04-28 ENCOUNTER — Encounter (HOSPITAL_COMMUNITY)
Admission: EM | Disposition: A | Discharge status: Home / Self Care | Payer: Self-pay | Source: Home / Self Care | Attending: Family Medicine

## 2023-04-28 ENCOUNTER — Encounter (HOSPITAL_COMMUNITY): Payer: Self-pay | Admitting: Family Medicine

## 2023-04-28 ENCOUNTER — Telehealth: Payer: Self-pay

## 2023-04-28 DIAGNOSIS — K831 Obstruction of bile duct: Secondary | ICD-10-CM

## 2023-04-28 DIAGNOSIS — R933 Abnormal findings on diagnostic imaging of other parts of digestive tract: Secondary | ICD-10-CM

## 2023-04-28 DIAGNOSIS — K859 Acute pancreatitis without necrosis or infection, unspecified: Secondary | ICD-10-CM | POA: Diagnosis not present

## 2023-04-28 DIAGNOSIS — K861 Other chronic pancreatitis: Secondary | ICD-10-CM | POA: Diagnosis not present

## 2023-04-28 DIAGNOSIS — F17210 Nicotine dependence, cigarettes, uncomplicated: Secondary | ICD-10-CM

## 2023-04-28 DIAGNOSIS — E109 Type 1 diabetes mellitus without complications: Secondary | ICD-10-CM

## 2023-04-28 HISTORY — PX: BILIARY STENT PLACEMENT: SHX5538

## 2023-04-28 HISTORY — PX: ERCP: SHX5425

## 2023-04-28 HISTORY — PX: BILIARY BRUSHING: SHX6843

## 2023-04-28 LAB — COMPREHENSIVE METABOLIC PANEL
ALT: 189 U/L — ABNORMAL HIGH (ref 0–44)
AST: 168 U/L — ABNORMAL HIGH (ref 15–41)
Albumin: 2.3 g/dL — ABNORMAL LOW (ref 3.5–5.0)
Alkaline Phosphatase: 923 U/L — ABNORMAL HIGH (ref 38–126)
Anion gap: 9 (ref 5–15)
BUN: <5 mg/dL — ABNORMAL LOW (ref 6–20)
CO2: 24 mmol/L (ref 22–32)
Calcium: 8.5 mg/dL — ABNORMAL LOW (ref 8.9–10.3)
Chloride: 100 mmol/L (ref 98–111)
Creatinine, Ser: 0.78 mg/dL (ref 0.61–1.24)
GFR, Estimated: >60 mL/min (ref >60)
Glucose, Bld: 114 mg/dL — ABNORMAL HIGH (ref 70–99)
Potassium: 3.3 mmol/L — ABNORMAL LOW (ref 3.5–5.1)
Sodium: 133 mmol/L — ABNORMAL LOW (ref 135–145)
Total Bilirubin: 10.2 mg/dL — ABNORMAL HIGH (ref 0.3–1.2)
Total Protein: 5.9 g/dL — ABNORMAL LOW (ref 6.5–8.1)

## 2023-04-28 LAB — CBC
HCT: 30.4 % — ABNORMAL LOW (ref 39.0–52.0)
Hemoglobin: 10.7 g/dL — ABNORMAL LOW (ref 13.0–17.0)
MCH: 34.2 pg — ABNORMAL HIGH (ref 26.0–34.0)
MCHC: 35.2 g/dL (ref 30.0–36.0)
MCV: 97.1 fL (ref 80.0–100.0)
Platelets: 249 10*3/uL (ref 150–400)
RBC: 3.13 MIL/uL — ABNORMAL LOW (ref 4.22–5.81)
RDW: 14.6 % (ref 11.5–15.5)
WBC: 5.6 10*3/uL (ref 4.0–10.5)
nRBC: 0 % (ref 0.0–0.2)

## 2023-04-28 LAB — MAGNESIUM: Magnesium: 1.6 mg/dL — ABNORMAL LOW (ref 1.7–2.4)

## 2023-04-28 LAB — GLUCOSE, CAPILLARY
Glucose-Capillary: 122 mg/dL — ABNORMAL HIGH (ref 70–99)
Glucose-Capillary: 138 mg/dL — ABNORMAL HIGH (ref 70–99)
Glucose-Capillary: 161 mg/dL — ABNORMAL HIGH (ref 70–99)
Glucose-Capillary: 168 mg/dL — ABNORMAL HIGH (ref 70–99)
Glucose-Capillary: 169 mg/dL — ABNORMAL HIGH (ref 70–99)
Glucose-Capillary: 173 mg/dL — ABNORMAL HIGH (ref 70–99)
Glucose-Capillary: 263 mg/dL — ABNORMAL HIGH (ref 70–99)

## 2023-04-28 LAB — IGG 4: IgG, Subclass 4: 61 mg/dL (ref 2–96)

## 2023-04-28 SURGERY — ERCP, WITH INTERVENTION IF INDICATED
Anesthesia: General

## 2023-04-28 MED ORDER — SODIUM CHLORIDE 0.9 % IV SOLN
INTRAVENOUS | Status: DC | PRN
  Administered 2023-04-28: 20 mL

## 2023-04-28 MED ORDER — LACTATED RINGERS IV SOLN: INTRAVENOUS | Status: DC

## 2023-04-28 MED ORDER — ROCURONIUM BROMIDE 10 MG/ML (PF) SYRINGE
PREFILLED_SYRINGE | INTRAVENOUS | Status: DC | PRN
  Administered 2023-04-28: 50 mg via INTRAVENOUS

## 2023-04-28 MED ORDER — POTASSIUM CHLORIDE 20 MEQ PO PACK
40.0000 meq | PACK | Freq: Once | ORAL | Status: DC
  Filled 2023-04-28: qty 2

## 2023-04-28 MED ORDER — GLUCAGON HCL RDNA (DIAGNOSTIC) 1 MG IJ SOLR
INTRAMUSCULAR | Status: DC | PRN
  Administered 2023-04-28 (×2): .25 mg via INTRAVENOUS

## 2023-04-28 MED ORDER — HYDROMORPHONE HCL 1 MG/ML IJ SOLN: 1.0000 mg | Freq: Once | INTRAMUSCULAR | Status: DC

## 2023-04-28 MED ORDER — DICLOFENAC SUPPOSITORY 100 MG
RECTAL | Status: AC
  Filled 2023-04-28: qty 1

## 2023-04-28 MED ORDER — MIDAZOLAM HCL 2 MG/2ML IJ SOLN
INTRAMUSCULAR | Status: DC | PRN
  Administered 2023-04-28: 2 mg via INTRAVENOUS

## 2023-04-28 MED ORDER — MIDAZOLAM HCL (PF) 5 MG/ML IJ SOLN
INTRAMUSCULAR | Status: AC
  Filled 2023-04-28: qty 1

## 2023-04-28 MED ORDER — PROPOFOL 10 MG/ML IV BOLUS
INTRAVENOUS | Status: DC | PRN
  Administered 2023-04-28: 130 mg via INTRAVENOUS

## 2023-04-28 MED ORDER — LIDOCAINE 2% (20 MG/ML) 5 ML SYRINGE
INTRAMUSCULAR | Status: DC | PRN
  Administered 2023-04-28: 40 mg via INTRAVENOUS

## 2023-04-28 MED ORDER — PHENYLEPHRINE 80 MCG/ML (10ML) SYRINGE FOR IV PUSH (FOR BLOOD PRESSURE SUPPORT)
PREFILLED_SYRINGE | INTRAVENOUS | Status: DC | PRN
  Administered 2023-04-28: 160 ug via INTRAVENOUS

## 2023-04-28 MED ORDER — SODIUM CHLORIDE 0.9 % IV SOLN: INTRAVENOUS | Status: DC

## 2023-04-28 MED ORDER — DICLOFENAC SUPPOSITORY 100 MG: 100.0000 mg | Freq: Once | RECTAL | Status: DC

## 2023-04-28 MED ORDER — POTASSIUM CHLORIDE 20 MEQ PO PACK: 40.0000 meq | PACK | Freq: Two times a day (BID) | ORAL | Status: DC

## 2023-04-28 MED ORDER — MORPHINE SULFATE (PF) 4 MG/ML IV SOLN
4.0000 mg | Freq: Once | INTRAVENOUS | Status: AC
  Administered 2023-04-29: 4 mg via INTRAVENOUS
  Filled 2023-04-28: qty 1

## 2023-04-28 MED ORDER — HYDROMORPHONE HCL 1 MG/ML IJ SOLN
INTRAMUSCULAR | Status: AC
  Filled 2023-04-28: qty 1

## 2023-04-28 MED ORDER — ONDANSETRON HCL 4 MG/2ML IJ SOLN
INTRAMUSCULAR | Status: DC | PRN
  Administered 2023-04-28: 4 mg via INTRAVENOUS

## 2023-04-28 MED ORDER — FENTANYL CITRATE (PF) 100 MCG/2ML IJ SOLN
INTRAMUSCULAR | Status: AC
  Filled 2023-04-28: qty 2

## 2023-04-28 MED ORDER — GLUCAGON HCL RDNA (DIAGNOSTIC) 1 MG IJ SOLR
INTRAMUSCULAR | Status: AC
  Filled 2023-04-28: qty 1

## 2023-04-28 MED ORDER — DICLOFENAC SUPPOSITORY 100 MG
RECTAL | Status: DC | PRN
  Administered 2023-04-28: 100 mg via RECTAL

## 2023-04-28 MED ORDER — FENTANYL CITRATE (PF) 250 MCG/5ML IJ SOLN
INTRAMUSCULAR | Status: DC | PRN
  Administered 2023-04-28: 100 ug via INTRAVENOUS

## 2023-04-28 MED ORDER — SUGAMMADEX SODIUM 200 MG/2ML IV SOLN
INTRAVENOUS | Status: DC | PRN
  Administered 2023-04-28: 200 mg via INTRAVENOUS

## 2023-04-28 NOTE — Progress Notes (Signed)
PROGRESS NOTE   Carlos [name]  VWU:[id number] DOB: [birth year] DOA: 04/22/2023 PCP: [name], MD  Brief Narrative:  37 year old male alcohol in remission since 2021 IDDM Chronic pancreatitis with history of splenic venous thrombosis previously AC apixaban Follows chronically with Dr. 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  CBD obstruction versus hepatocyte dysfunction 6/18 MRCP no cholecystitis dilatation from CBD chronic pancreatic findings no CBD stone  So far gastroenterology workup-AMA 20 ASMA is normal IgG is normal ANA is negative and hepatitis panel negative  Hospital-Problem based course  PD stricture status post PD stents in the past-possible central stricture with stent in place and worsening biliary ductal dilatation with caliber change of CBD distally Prior ethanolism with abstinent since 09/2020 Scheduled for ERCP/EGD today and hopefully can graduate diet and see how patient feels in a.m. LFTs continue to improve Patient has been on Creon since 6/15 and we will narrow as appropriate per GI Can you Compazine 5 every 4 as needed nausea Continues on mealtime Creon 72,000 and snack time Creon 36,000  Mild hypokalemia-replaced previously-monitor-check a.m. mag, replace orally  Diabetes mellitus TY 2--insulin pump at home?  Continuous glucometer as well Dexcom-baseline long-acting  is 16 units with sliding scale--- currently I am continuing only 6 units of long-acting CBGs 1 61-2 38 Monitor trends-eventually increase Semglee insulin once eating regular meals  Elevated blood pressure without a diagnosis of hypertension On as needed hydralazine IV  Mild hypokalemia Give 1 packet of K-Lor and repeat labs periodically  Prior splenic venous thrombosis Not on anticoagulation any longer  Probable chronic pain from chronic pancreatitis Previously has been on Dilaudid 4 mg every 4 as needed severe pain-needs to get these prescription filled by Dr. Hyacinth Meeker on discharge He sees Dr. Jodie Echevaria LE of pain management and I have reviewed the PDMP and he received 30-day supply of hydromorphone on 6/2  moderate malnutrition BMI 19  DVT prophylaxis: SCD Code Status: Full Family Communication: None present today Disposition:  Status is: Inpatient Remains inpatient appropriate because: Requires further inpatient management and care    Subjective:  Sitting up watching videos on his phone Does not seem to be in distress--- note low-grade temp of 100 [37.8] yesterday afternoon No chest pain no fever No vomit  Objective: Vitals:   04/27/23 1603 04/27/23 1945 04/28/23 0442 04/28/23 0803  BP: (!) 169/106 126/81 132/87 125/89  Pulse: 83 68 74 67  Resp: 18 18 18 17   Temp: 100 F (37.8 C) 98.8 F (37.1 C) 98.7 F (37.1 C) 98.6 F (37 C)  TempSrc: Oral     SpO2: 100% 100% 100% 100%  Weight:      Height:       No intake or output data in the 24 hours ending 04/28/23 1051  Filed Weights   04/22/23 1435 04/24/23 0500 04/25/23 0500  Weight: 79.4 kg 75.3 kg 75 kg    Examination:  Icterus noticed today no pallor-nose ring at  philtrum-multiple tattoos Neck soft supple Chest is clear with no wheeze rales rhonchi Abdomen soft no rebound no guarding-objective findings out of proportion to subjective exam ROM intact No lower extremity edema   Data Reviewed: personally  reviewed   CBC    Component Value Date/Time   WBC 5.6 04/28/2023 0315   RBC 3.13 (L) 04/28/2023 0315   HGB 10.7 (L) 04/28/2023 0315   HCT 30.4 (L) 04/28/2023 0315   PLT 249 04/28/2023 0315   MCV 97.1 04/28/2023 0315   MCH 34.2 (H) 04/28/2023 0315   MCHC 35.2 04/28/2023 0315   RDW 14.6 04/28/2023 0315   LYMPHSABS 0.6 (L) 04/22/2023 1509   MONOABS 0.7 04/22/2023 1509   EOSABS 0.1 04/22/2023 1509   BASOSABS 0.0 04/22/2023 1509      Latest Ref Rng & Units 04/28/2023    3:15 AM 04/27/2023    2:54 AM 04/26/2023    4:46 AM  CMP  Glucose 70 - 99 mg/dL 829  562  130   BUN 6 - 20 mg/dL <5  <5  <5   Creatinine 0.61 - 1.24 mg/dL 8.65  7.84  6.96   Sodium 135 - 145 mmol/L 133  132  132   Potassium 3.5 - 5.1 mmol/L 3.3  3.4  3.4   Chloride 98 - 111 mmol/L 100  101  99   CO2 22 - 32 mmol/L 24  24  21    Calcium 8.9 - 10.3 mg/dL 8.5  8.5  8.6   Total Protein 6.5 - 8.1 g/dL 5.9  5.7  6.0   Total Bilirubin 0.3 - 1.2 mg/dL 29.5  28.4  13.2   Alkaline Phos 38 - 126 U/L 923  1,050  1,219   AST 15 - 41 U/L 168  280  458   ALT 0 - 44 U/L 189  212  225      Radiology Studies: No results found.   Scheduled Meds:  insulin aspart  0-9 Units Subcutaneous Q4H   insulin glargine-yfgn  6 Units Subcutaneous BID   lipase/protease/amylase  36,000 Units Oral With snacks   lipase/protease/amylase  72,000 Units Oral TID WC   pantoprazole (PROTONIX) IV  40 mg Intravenous Q12H   Continuous Infusions:  ampicillin-sulbactam (UNASYN) IV 3 g (04/28/23 0515)     LOS: 6 days   Time spent: 20  Rhetta Mura, MD [facility] To contact the attending provider between 7A-7P or the covering provider during after hours 7P-7A, please log into the web site [email or web address] and access using universal [place] password for that web site. If you do not have the password, please call the hospital operator.  04/28/2023, 10:51 AM

## 2023-04-28 NOTE — Anesthesia Procedure Notes (Signed)
Procedure Name: Intubation Date/Time: 04/28/2023 1:14 PM  Performed by: Gus Puma, CRNAPre-anesthesia Checklist: Patient identified, Emergency Drugs available, Suction available and Patient being monitored Patient Re-evaluated:Patient Re-evaluated prior to induction Oxygen Delivery Method: Circle System Utilized Preoxygenation: Pre-oxygenation with 100% oxygen Induction Type: IV induction Ventilation: Mask ventilation without difficulty Laryngoscope Size: Mac and 4 Grade View: Grade II Tube type: Oral Tube size: 7.5 mm Number of attempts: 1 Airway Equipment and Method: Stylet Placement Confirmation: ETT inserted through vocal cords under direct vision, positive ETCO2 and breath sounds checked- equal and bilateral Secured at: 23 cm Tube secured with: Tape Dental Injury: Teeth and Oropharynx as per pre-operative assessment

## 2023-04-28 NOTE — Anesthesia Preprocedure Evaluation (Addendum)
Anesthesia Evaluation  Patient identified by MRN, date of birth, ID band Patient awake    Reviewed: Allergy & Precautions, NPO status , Patient's Chart, lab work & pertinent test results  History of Anesthesia Complications Negative for: history of anesthetic complications  Airway Mallampati: II  TM Distance: >3 FB Neck ROM: Full    Dental  (+) Chipped,    Pulmonary Current Smoker and Patient abstained from smoking.   breath sounds clear to auscultation       Cardiovascular  Rhythm:Regular  TTE 2021  1. Left ventricular ejection fraction, by estimation, is 60 to 65%. The  left ventricle has normal function. The left ventricle has no regional  wall motion abnormalities. There is mild left ventricular hypertrophy.  Left ventricular diastolic parameters  were normal.   2. Right ventricular systolic function is normal. The right ventricular  size is normal.   3. The mitral valve is normal in structure. No evidence of mitral valve  regurgitation. No evidence of mitral stenosis.   4. The aortic valve is tricuspid. Aortic valve regurgitation is not  visualized. No aortic stenosis is present.   5. The inferior vena cava is normal in size with greater than 50%  respiratory variability, suggesting right atrial pressure of 3 mmHg.     Neuro/Psych  PSYCHIATRIC DISORDERS Anxiety     negative neurological ROS     GI/Hepatic negative GI ROS,,,(+)     substance abuse  alcohol use  Endo/Other  diabetes, Type 1, Insulin Dependent  Insulin pump and dexcom both off  Renal/GU negative Renal ROS  negative genitourinary   Musculoskeletal negative musculoskeletal ROS (+)    Abdominal   Peds  Hematology negative hematology ROS (+)   Anesthesia Other Findings Chronic pancreatits  Reproductive/Obstetrics                             Anesthesia Physical Anesthesia Plan  ASA: 3  Anesthesia Plan: General    Post-op Pain Management: Minimal or no pain anticipated   Induction: Intravenous  PONV Risk Score and Plan: Midazolam, Dexamethasone and Ondansetron  Airway Management Planned: Oral ETT  Additional Equipment: None  Intra-op Plan:   Post-operative Plan: Extubation in OR  Informed Consent: I have reviewed the patients History and Physical, chart, labs and discussed the procedure including the risks, benefits and alternatives for the proposed anesthesia with the patient or authorized representative who has indicated his/her understanding and acceptance.     Dental advisory given  Plan Discussed with: Anesthesiologist  Anesthesia Plan Comments:        Anesthesia Quick Evaluation

## 2023-04-28 NOTE — Op Note (Signed)
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 [name], MD, Eliberto Ivory, RN, Marja Kays, Technician, Margaree Mackintosh, RN Referring MD:             [name], MD Medicines:                General Anesthesia Complications:            No immediate complications. Estimated Blood Loss:     Estimated blood loss: none. Procedure:                Pre-Anesthesia Assessment:                           - Prior to the procedure, a History and Physical                            was performed, and patient medications and                            allergies were reviewed. The patient's tolerance of                            previous anesthesia was also reviewed. The risks                            and benefits of the procedure and the sedation                            options and risks were discussed with the patient.                            All questions were answered, and informed consent                            was obtained. Prior Anticoagulants: The patient has                            taken no anticoagulant or antiplatelet agents. ASA                            Grade Assessment: III - A patient with severe  systemic disease. After reviewing the risks and                            benefits, the patient was deemed in satisfactory                            condition to undergo the procedure.                           After obtaining informed consent, the scope was                            passed under direct vision. Throughout the                             procedure, the patient's blood pressure, pulse, and                            oxygen saturations were monitored continuously. The                            [employer] D single use                            duodenoscope was introduced through the mouth, and                            used to inject contrast into and used to inject                            contrast into the bile duct. The ERCP was                            accomplished without difficulty. The patient                            tolerated the procedure well. Scope In: Scope Out: Findings:      Two pancreatic stents were visible on the scout film. The esophagus was       successfully intubated under direct vision. The scope was advanced to       the major papilla in the descending duodenum without detailed       examination of the pharynx, larynx and associated structures, and upper       GI tract. Two PD stents in good position, both appeared clogged. Prior       pancreatic and biliary sphincterotomies noted. The upper GI tract was       otherwise grossly normal. A straight Roadrunner wire was passed into the       biliary tree. The short-nosed traction sphincterotome was passed over       the guidewire and the bile duct was then deeply cannulated. Contrast was       injected. I personally interpreted the bile duct images. Ductal flow of       contrast was adequate. The right intrahepatic ducts were not filled and  the left intrahepatic were partially filled. The intrahepatic ducts       appeared unremarkable on the MRCP. The lower third of the main bile duct       contained a single moderately severe localized stenosis 10 mm in length.       The common bile duct was mildly diffusely dilated, secondary to a       stricture. The largest diameter was 7 mm. The GB was not filled. The CBD       stricture was brushed for cytology. One 8.5 Fr by 5 cm plastic stent       with a single external flap  and a single internal flap was placed 4 cm       into the common bile duct. Bile and contrast flowed through the stent.       Excellent biliary drainage was noted. The biliary stent was in good       position. The PD was intentionally not entered. Impression:               - A single moderately severe localized biliary                            stricture was found in the lower third of the main                            bile duct. The stricture was benign appearing.                            Brushed for cytology.                           - The common bile duct was mildly dilated,                            secondary to the stricture.                           - One plastic stent was placed into the common bile                            duct.                           - Prior pancreatic and biliary sphincterotomies                            were noted.                           - Two PD stents in good postion were noted - both                            appeared clogged. Recommendation:           - Return patient to hospital ward for ongoing care.                           - Observe patient's clinical course  following                            today's ERCP with therapeutic intervention.                           - Clear liquid diet today.                           - Trend LFTs.                           - Await cytology.                           - Repeat ERCP with Dr. [name] planned on 7/25. Procedure Code(s):        --- Professional ---                           [phone], Endoscopic retrograde                            cholangiopancreatography (ERCP); with placement of                            endoscopic stent into biliary or pancreatic duct,                            including pre- and post-dilation and guide wire                            passage, when performed, including sphincterotomy,                            when performed, each stent                            [id number], Endoscopic catheterization of the biliary                            ductal system, radiological supervision and                            interpretation Diagnosis Code(s):        --- Professional ---                           K83.1, Obstruction of bile duct                           R74.8, Abnormal levels of other serum enzymes                           R93.2, Abnormal findings on diagnostic imaging of                            liver and biliary tract CPT copyright 2022 [employer].  All rights reserved. The codes documented in this report are preliminary and upon coder review may  be revised to meet current compliance requirements. Meryl Dare, MD 04/28/2023 2:19:31 PM This report has been signed electronically. Number of Addenda: 0

## 2023-04-28 NOTE — Telephone Encounter (Signed)
ERCP showed a distal CBD stricture c/w findings on MRCP and two indwelling PD stents in good position. CBD stricture brushed for cytology although suspect this a benign CBD stricture due to chronic pancreatitis. One plastic biliary stent placed. GM will decide on timing of biliary stent removal, probably around 3 months.   1  3:23 PM Lemar Lofty., MD was added by Imogene Burn, MD. 3:25 PM Imogene Burn, MD Hi gabe, I know you wanted a recheck of his LFTs in 4 weeks. Do you want [place] to reschedule your ERCP too?   YD Currently it is on 7/25  3:25 PM You were added by Lemar Lofty., MD. 3:26 PM Lemar Lofty., MD I will need to change out the PD stents so I'll plan to keep his current scheduled procedure. LFTs in a week and that can be forwarded to whomever MD of the Day is since [place] be out of the [place]. Keep current clinic visit and ERCP please.  1   GM I may have to exchange the biliary stent as well, pending how things look.

## 2023-04-28 NOTE — Interval H&P Note (Signed)
History and Physical Interval Note:  04/28/2023 1:04 PM  Carlos [name]  has presented today for surgery, with the diagnosis of Bile duct obstruction, Hyperbilirubinemia.  The various methods of treatment have been discussed with the patient and family. After consideration of risks, benefits and other options for treatment, the patient has consented to  Procedure(s): ENDOSCOPIC RETROGRADE CHOLANGIOPANCREATOGRAPHY (ERCP) (N/A) as a surgical intervention.  The patient's history has been reviewed, patient examined, no change in status, stable for surgery.  I have reviewed the patient's chart and labs.  Questions were answered to the patient's satisfaction.     Venita Lick. [name]

## 2023-04-28 NOTE — Transfer of Care (Signed)
Immediate Anesthesia Transfer of Care Note  Patient: Carlos [name]  Procedure(s) Performed: ENDOSCOPIC RETROGRADE CHOLANGIOPANCREATOGRAPHY (ERCP) BILIARY BRUSHING BILIARY STENT PLACEMENT  Patient Location: Endoscopy Unit  Anesthesia Type:General  Level of Consciousness: awake, drowsy, and patient cooperative  Airway & Oxygen Therapy: Patient Spontanous Breathing  Post-op Assessment: Report given to RN and Post -op Vital signs reviewed and stable  Post vital signs: Reviewed and stable  Last Vitals:  Vitals Value Taken Time  BP 147/95 04/28/23 1412  Temp 36.6 C 04/28/23 1412  Pulse 88 04/28/23 1414  Resp 13 04/28/23 1414  SpO2 97 % 04/28/23 1414  Vitals shown include unvalidated device data.  Last Pain:  Vitals:   04/28/23 1412  TempSrc: Temporal  PainSc: 9       Patients Stated Pain Goal: 0 (04/25/23 1602)  Complications: No notable events documented.

## 2023-04-28 NOTE — Hospital Course (Addendum)
Impression:               - A single moderately severe localized biliary                            stricture was found in the lower third of the main                            bile duct. The stricture was benign appearing.                            Brushed for cytology.                           - The common bile duct was mildly dilated,                            secondary to the stricture.                           - One plastic stent was placed into the common bile                            duct.                           - Prior pancreatic and biliary sphincterotomies                            were noted.                           - Two PD stents in good postion were noted - both                            appeared clogged. Recommendation:           - Return patient to hospital ward for ongoing care.                           - Observe patient's clinical course following                            today's ERCP with therapeutic intervention.                           - Clear liquid diet today.                           - Trend LFTs.                           - Await cytology.                           - Repeat ERCP with Dr. [name] planned on 7/25.   PD stricture status post PD stents in the past-possible central stricture  with stent in place and worsening biliary ductal dilatation with caliber change of CBD distally Prior ethanolism with abstinent since 09/2020 ERCP/EGD as above Creon since 6/15 resumed-- Creon 72,000 and snack time Creon 36,000 Cont Compazine 5 every 4 as needed nausea He is having increasing 9/10 pain today and we will give some IV dilaudid and check a lipase given biliary instrumentation  Mild hypokalemia-replaced , mag is ok  Diabetes mellitus TY 2--insulin pump at home?  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 Hyacinth Meeker on discharge He sees Dr. Jodie Echevaria LE of pain management and I have reviewed the PDMP and he received 30-day supply of hydromorphone on 6/2  moderate malnutrition BMI 19

## 2023-04-29 ENCOUNTER — Other Ambulatory Visit (HOSPITAL_COMMUNITY): Payer: Self-pay

## 2023-04-29 ENCOUNTER — Inpatient Hospital Stay (HOSPITAL_COMMUNITY): Payer: Medicaid Other

## 2023-04-29 DIAGNOSIS — K861 Other chronic pancreatitis: Secondary | ICD-10-CM | POA: Diagnosis not present

## 2023-04-29 DIAGNOSIS — K859 Acute pancreatitis without necrosis or infection, unspecified: Secondary | ICD-10-CM | POA: Diagnosis not present

## 2023-04-29 LAB — COMPREHENSIVE METABOLIC PANEL
ALT: 137 U/L — ABNORMAL HIGH (ref 0–44)
AST: 50 U/L — ABNORMAL HIGH (ref 15–41)
Albumin: 2.5 g/dL — ABNORMAL LOW (ref 3.5–5.0)
Alkaline Phosphatase: 861 U/L — ABNORMAL HIGH (ref 38–126)
Anion gap: 10 (ref 5–15)
BUN: <5 mg/dL — ABNORMAL LOW (ref 6–20)
CO2: 27 mmol/L (ref 22–32)
Calcium: 9 mg/dL (ref 8.9–10.3)
Chloride: 95 mmol/L — ABNORMAL LOW (ref 98–111)
Creatinine, Ser: 0.88 mg/dL (ref 0.61–1.24)
GFR, Estimated: >60 mL/min (ref >60)
Glucose, Bld: 149 mg/dL — ABNORMAL HIGH (ref 70–99)
Potassium: 3.8 mmol/L (ref 3.5–5.1)
Sodium: 132 mmol/L — ABNORMAL LOW (ref 135–145)
Total Bilirubin: 5.8 mg/dL — ABNORMAL HIGH (ref 0.3–1.2)
Total Protein: 6.4 g/dL — ABNORMAL LOW (ref 6.5–8.1)

## 2023-04-29 LAB — GLUCOSE, CAPILLARY
Glucose-Capillary: 140 mg/dL — ABNORMAL HIGH (ref 70–99)
Glucose-Capillary: 162 mg/dL — ABNORMAL HIGH (ref 70–99)
Glucose-Capillary: 212 mg/dL — ABNORMAL HIGH (ref 70–99)
Glucose-Capillary: 231 mg/dL — ABNORMAL HIGH (ref 70–99)
Glucose-Capillary: 280 mg/dL — ABNORMAL HIGH (ref 70–99)
Glucose-Capillary: 284 mg/dL — ABNORMAL HIGH (ref 70–99)

## 2023-04-29 LAB — LIPASE, BLOOD: Lipase: 21 U/L (ref 11–51)

## 2023-04-29 LAB — CBC
HCT: 33.2 % — ABNORMAL LOW (ref 39.0–52.0)
Hemoglobin: 11.7 g/dL — ABNORMAL LOW (ref 13.0–17.0)
MCH: 35.2 pg — ABNORMAL HIGH (ref 26.0–34.0)
MCHC: 35.2 g/dL (ref 30.0–36.0)
MCV: 100 fL (ref 80.0–100.0)
Platelets: 323 10*3/uL (ref 150–400)
RBC: 3.32 MIL/uL — ABNORMAL LOW (ref 4.22–5.81)
RDW: 14.6 % (ref 11.5–15.5)
WBC: 5.9 10*3/uL (ref 4.0–10.5)
nRBC: 0 % (ref 0.0–0.2)

## 2023-04-29 LAB — MAGNESIUM: Magnesium: 1.7 mg/dL (ref 1.7–2.4)

## 2023-04-29 MED ORDER — HYDROMORPHONE HCL 1 MG/ML IJ SOLN
1.0000 mg | INTRAMUSCULAR | Status: DC | PRN
  Administered 2023-04-29 (×3): 1 mg via INTRAVENOUS
  Filled 2023-04-29 (×3): qty 1

## 2023-04-29 NOTE — Progress Notes (Signed)
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  CBD obstruction versus hepatocyte dysfunction 6/18 MRCP no cholecystitis dilatation from CBD chronic pancreatic findings no CBD stone  So far gastroenterology workup-AMA 20 ASMA is normal IgG is normal ANA is negative and hepatitis panel negative  6/19 EGD-EUS Impression:               - A single moderately severe localized biliary                            stricture was found in the lower third of the main                            bile duct. The stricture was benign appearing.                            Brushed for cytology.                           - The common bile duct was mildly dilated,                            secondary to the stricture.                           - One plastic stent was placed into the common bile                            duct.                           - Prior pancreatic and biliary sphincterotomies                            were noted.                           - Two PD stents in good postion were noted - both                             appeared clogged. Recommendation:           - Return patient to hospital ward for ongoing care.                           -  Observe patient's clinical course following                            today's ERCP with therapeutic intervention.                           - Clear liquid diet today.                           - Trend LFTs.                           - Await cytology.                           - Repeat ERCP with Dr. [name] planned on 7/25.  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 Hyacinth Meeker on discharge He sees Dr. Jodie Echevaria LE of pain management and I have reviewed the PDMP and he received 30-day supply of hydromorphone on 6/2  moderate malnutrition BMI 19  DVT prophylaxis: SCD Code Status: Full Family Communication: None present today Disposition:  Status is: Inpatient Remains inpatient appropriate because: Requires further inpatient management and care    Subjective:  Pain  9/10 No n No vomit No cp Only taking in broth  Objective: Vitals:   04/28/23 1952 04/29/23 0501 04/29/23 0630 04/29/23 0741  BP: (!) 136/93 130/86  (!) 136/96  Pulse: 67 82  70  Resp: 18 18    Temp: 99.3 F (37.4 C) 98.7 F (37.1 C)  98.1 F (36.7 C)  TempSrc:      SpO2: 100% 100%  100%  Weight:   72.6 kg   Height:        Intake/Output Summary (Last 24 hours) at 04/29/2023 0924 Last data filed at 04/29/2023 0707 Gross per 24 hour  Intake 1000 ml  Output 0 ml  Net 1000 ml    Filed Weights   04/24/23 0500 04/25/23 0500 04/29/23 0630  Weight: 75.3 kg 75 kg 72.6 kg    Examination:  Icterus decreased, some pallor Neck soft supple Chest is clear  Abdomen soft mild discomfort on exam ROM intact No lower extremity edema   Data Reviewed: personally reviewed   CBC    Component Value Date/Time   WBC 5.9 04/29/2023 0606   RBC 3.32 (L) 04/29/2023 0606   HGB 11.7 (L) 04/29/2023 0606   HCT 33.2 (L) 04/29/2023  0606   PLT 323 04/29/2023 0606   MCV 100.0 04/29/2023 0606   MCH 35.2 (H) 04/29/2023 0606   MCHC 35.2 04/29/2023 0606   RDW 14.6 04/29/2023 0606   LYMPHSABS 0.6 (L) 04/22/2023 1509   MONOABS 0.7 04/22/2023 1509   EOSABS 0.1 04/22/2023 1509   BASOSABS 0.0 04/22/2023 1509      Latest Ref Rng & Units 04/29/2023    6:06 AM 04/28/2023    3:15 AM 04/27/2023    2:54 AM  CMP  Glucose 70 - 99 mg/dL 161  096  045   BUN 6 - 20 mg/dL <5  <5  <5   Creatinine 0.61 - 1.24 mg/dL 4.09  8.11  9.14   Sodium 135 - 145 mmol/L 132  133  132   Potassium 3.5 - 5.1 mmol/L 3.8  3.3  3.4   Chloride 98 - 111 mmol/L 95  100  101   CO2 22 - 32 mmol/L 27  24  24    Calcium 8.9 - 10.3 mg/dL 9.0  8.5  8.5   Total Protein 6.5 - 8.1 g/dL 6.4  5.9  5.7   Total Bilirubin 0.3 - 1.2 mg/dL 5.8  78.2  95.6   Alkaline Phos 38 - 126 U/L 861  923  1,050   AST 15 - 41 U/L 50  168  280   ALT 0 - 44 U/L 137  189  212      Radiology Studies: DG ERCP  Result Date: 04/28/2023 CLINICAL DATA:   ERCP EXAM: ERCP TECHNIQUE: Multiple spot images obtained with the fluoroscopic device and submitted for interpretation post-procedure. FLUOROSCOPY: See separate report COMPARISON:  None Available. FINDINGS: A total of 13 fluoroscopic spot images taken during ERCP are submitted for review. Initial images demonstrate a scope overlying the upper abdomen. A pancreatic duct stent appears in place. Wire catheterization of the common bile duct is performed. Contrast injection demonstrates a mildly dilated appearance of the common bile duct. The intrahepatic biliary ducts are not well visualized. A biliary stent is placed, and the scope was withdrawn. IMPRESSION: ERCP images as described. Refer to separate report for full details. These images were submitted for radiologic interpretation only. Please see the procedural report for the amount of contrast and the fluoroscopy time utilized. Electronically Signed   By: Olive Bass M.D.   On: 04/28/2023 15:59     Scheduled Meds:  diclofenac  100 mg Rectal Once   insulin aspart  0-9 Units Subcutaneous Q4H   insulin glargine-yfgn  6 Units Subcutaneous BID   lipase/protease/amylase  36,000 Units Oral With snacks   lipase/protease/amylase  72,000 Units Oral TID WC   pantoprazole (PROTONIX) IV  40 mg Intravenous Q12H   potassium chloride  40 mEq Oral Once   Continuous Infusions:  ampicillin-sulbactam (UNASYN) IV Stopped (04/29/23 0659)     LOS: 7 days   Time spent: 64  Rhetta Mura, MD [facility] To contact the attending provider between 7A-7P or the covering provider during after hours 7P-7A, please log into the web site [email or web address] and access using universal [place] password for that web site. If you do not have the password, please call the hospital operator.  04/29/2023, 9:24 AM

## 2023-04-29 NOTE — Progress Notes (Signed)
[facility] Progress Note  CC:  Elevated LFTs and right upper quadrant pain    Subjective: No nausea or vomiting.  He stated his upper abdominal pain is worse today when compared to his pain level prior to his ERCP yesterday.  He is tolerating a clear liquid diet.  He passed today brown stool yesterday.  No black or bloody stools.  No BM today.  No chest pain.  No shortness of breath or pleuritic pain.   Objective:  Vital signs in last 24 hours: Temp:  [97.7 F (36.5 C)-99.3 F (37.4 C)] 98.1 F (36.7 C) (06/21 0741) Pulse Rate:  [67-82] 70 (06/21 0741) Resp:  [16-18] 18 (06/21 0501) BP: (130-141)/(86-96) 136/96 (06/21 0741) SpO2:  [100 %] 100 % (06/21 0741) Weight:  [72.6 kg] 72.6 kg (06/21 0630) Last BM Date : 04/28/23 General: Alert 37 year old male in no acute distress. Heart: Regular rate and rhythm, no murmurs. Pulm: Breath sounds clear throughout. Chest: Chest wall nontender.  No crepitus. Abdomen: Soft, nondistended.  Moderate tenderness throughout the upper abdomen without rebound or guarding.  Positive bowel sounds to all 4 quadrants. Extremities:  Without edema. Neurologic:  Alert and  oriented x 4. Grossly normal neurologically. Psych:  Alert and cooperative. Normal mood and affect.  Intake/Output from previous day: 06/20 0701 - 06/21 0700 In: 900 [I.V.:800; IV Piggyback:100] Out: 0  Intake/Output this shift: Total I/O In: 100 [IV Piggyback:100] Out: -   Lab Results: Recent Labs    04/27/23 0254 04/28/23 0315 04/29/23 0606  WBC 6.7 5.6 5.9  HGB 11.3* 10.7* 11.7*  HCT 31.5* 30.4* 33.2*  PLT 236 249 323   BMET Recent Labs    04/27/23 0254 04/28/23 0315 04/29/23 0606  NA 132* 133* 132*  K 3.4* 3.3* 3.8  CL 101 100 95*  CO2 24 24 27   GLUCOSE 136* 114* 149*  BUN <5* <5* <5*  CREATININE 0.72 0.78 0.88  CALCIUM 8.5* 8.5* 9.0   LFT Recent Labs    04/29/23 0606  PROT 6.4*  ALBUMIN 2.5*  AST 50*  ALT 137*  ALKPHOS 861*   BILITOT 5.8*   PT/INR No results for input(s): "LABPROT", "INR" in the last 72 hours. Hepatitis Panel No results for input(s): "HEPBSAG", "HCVAB", "HEPAIGM", "HEPBIGM" in the last 72 hours.  DG ERCP  Result Date: 04/28/2023 CLINICAL DATA:  ERCP EXAM: ERCP TECHNIQUE: Multiple spot images obtained with the fluoroscopic device and submitted for interpretation post-procedure. FLUOROSCOPY: See separate report COMPARISON:  None Available. FINDINGS: A total of 13 fluoroscopic spot images taken during ERCP are submitted for review. Initial images demonstrate a scope overlying the upper abdomen. A pancreatic duct stent appears in place. Wire catheterization of the common bile duct is performed. Contrast injection demonstrates a mildly dilated appearance of the common bile duct. The intrahepatic biliary ducts are not well visualized. A biliary stent is placed, and the scope was withdrawn. IMPRESSION: ERCP images as described. Refer to separate report for full details. These images were submitted for radiologic interpretation only. Please see the procedural report for the amount of contrast and the fluoroscopy time utilized. Electronically Signed   By: Olive Bass M.D.   On: 04/28/2023 15:59    Assessment / Plan:  37 year old male with a history of chronic pancreatitis with PD stricture s/p PD stent with history of alcohol use disorder (abstinent from alcohol since 09/2020). MRI/MRCP 04/25/2023 showed acute-on-chronic pancreatitis with severe dilation of the pancreatic duct towards the tail with a central  stricture with a stent in place with worsening biliary ductal dilation with a caliber change of the distal common bile duct as the duct enters the pancreatic head with an adjacent complex eccentric lesion possibly evolving pseudocyst. ERCP 6/20 showed a distal CBD stricture c/w findings on MRCP and two indwelling PD stents in good position. CBD stricture brushed for cytology although suspect this a benign CBD  stricture due to chronic pancreatitis. One plastic biliary stent placed. Normal IgG and IgG4 levels.  Increased abdominal pain today with LFTs drifting downward.  Today T. Bili 5.8. Alk phos 861. AST 50. ALT 137. Lipase 21. WBC 5.9. On Unasyn IV.  -Stat KUB to assess biliary/PD stent placement -Continue Unasyn IV every 6 hours -Continue PPI IV bid -Continue Creon Q AC and with snacks -Pain management per the hospitalist -Await further recommendations per Dr. Leonides Schanz -Repeat hepatic panel in 1 week post hospital discharge  -Repeat ERCP for PD stent exchange with Dr. [name] scheduled 06/02/2023. Eventually will require removal of biliary stent, may need to be done at time of PD exchange  Normocytic anemia. No overt GI bleeding. Hg 10.7 -> 11.7.   Hyponatremia. Na+ 132.  Principal Problem:   Acute on chronic pancreatitis (HCC) Active Problems:   Elevated LFTs   Uncontrolled type 1 diabetes mellitus with hyperglycemia, with long-term current use of insulin (HCC)   Stricture of pancreatic duct   Common bile duct (CBD) stricture   Abnormal magnetic resonance cholangiopancreatography (MRCP)     LOS: 7 days   BRAILON DON  04/29/2023, 3:54PM

## 2023-04-29 NOTE — Plan of Care (Signed)

## 2023-04-30 DIAGNOSIS — K861 Other chronic pancreatitis: Secondary | ICD-10-CM | POA: Diagnosis not present

## 2023-04-30 DIAGNOSIS — K859 Acute pancreatitis without necrosis or infection, unspecified: Secondary | ICD-10-CM | POA: Diagnosis not present

## 2023-04-30 LAB — COMPREHENSIVE METABOLIC PANEL
ALT: 113 U/L — ABNORMAL HIGH (ref 0–44)
AST: 40 U/L (ref 15–41)
Albumin: 2.7 g/dL — ABNORMAL LOW (ref 3.5–5.0)
Alkaline Phosphatase: 757 U/L — ABNORMAL HIGH (ref 38–126)
Anion gap: 10 (ref 5–15)
BUN: <5 mg/dL — ABNORMAL LOW (ref 6–20)
CO2: 27 mmol/L (ref 22–32)
Calcium: 9.1 mg/dL (ref 8.9–10.3)
Chloride: 97 mmol/L — ABNORMAL LOW (ref 98–111)
Creatinine, Ser: 0.94 mg/dL (ref 0.61–1.24)
GFR, Estimated: >60 mL/min (ref >60)
Glucose, Bld: 206 mg/dL — ABNORMAL HIGH (ref 70–99)
Potassium: 3.7 mmol/L (ref 3.5–5.1)
Sodium: 134 mmol/L — ABNORMAL LOW (ref 135–145)
Total Bilirubin: 5.1 mg/dL — ABNORMAL HIGH (ref 0.3–1.2)
Total Protein: 6.7 g/dL (ref 6.5–8.1)

## 2023-04-30 LAB — GLUCOSE, CAPILLARY
Glucose-Capillary: 180 mg/dL — ABNORMAL HIGH (ref 70–99)
Glucose-Capillary: 192 mg/dL — ABNORMAL HIGH (ref 70–99)
Glucose-Capillary: 237 mg/dL — ABNORMAL HIGH (ref 70–99)
Glucose-Capillary: 292 mg/dL — ABNORMAL HIGH (ref 70–99)

## 2023-04-30 LAB — CBC
HCT: 33.6 % — ABNORMAL LOW (ref 39.0–52.0)
Hemoglobin: 11.5 g/dL — ABNORMAL LOW (ref 13.0–17.0)
MCH: 34.1 pg — ABNORMAL HIGH (ref 26.0–34.0)
MCHC: 34.2 g/dL (ref 30.0–36.0)
MCV: 99.7 fL (ref 80.0–100.0)
Platelets: 348 10*3/uL (ref 150–400)
RBC: 3.37 MIL/uL — ABNORMAL LOW (ref 4.22–5.81)
RDW: 14.8 % (ref 11.5–15.5)
WBC: 4.8 10*3/uL (ref 4.0–10.5)
nRBC: 0 % (ref 0.0–0.2)

## 2023-04-30 LAB — MAGNESIUM: Magnesium: 1.8 mg/dL (ref 1.7–2.4)

## 2023-04-30 MED ORDER — OXYCODONE HCL 10 MG PO TABS: 10.0000 mg | ORAL_TABLET | ORAL | 0 refills | Status: AC | PRN

## 2023-04-30 NOTE — Discharge Summary (Signed)
Physician Discharge Summary  Carlos [name] ZOX:[id number] DOB: [birth year] DOA: 04/22/2023  PCP: [name], MD  Admit date: 04/22/2023 Discharge date: 04/30/2023  Time spent: 26 minutes  Recommendations for Outpatient Follow-up:  Requires outpatient close follow-up with Dr. [name] of GI Needs Chem-12 CBC in about 1 week Please follow-up cytologies from ERCP and follow-up to gastroenterology workup as per Dr. 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-erosive esophagitis Candida hiatal hernia EUS chronic pancreatitis parenchymal loss causing cyst gastrostomy/enterostomy to be unsafe to be performed at that time-ultimately Last ERCP 02/02/2023 removal of occluded PD stent removal of PD stones 2 plastic stents across to stricture Anxiety   presented [facility] ED  on 6/14 with 2 weeks RUQ pain worsening significantly + nausea + vomiting + orange urine Alk phos 1064, bilirubin 5.8 lipase 19 AST/ALT 167/97 General surgery and GI  consulted--LFT's peaked on 6/18   6/17 HIDA = no signs of biliary or small bowel activity after 120 minutes of imaging?  CBD obstruction versus hepatocyte dysfunction 6/18 MRCP no cholecystitis dilatation from CBD chronic pancreatic findings no CBD stone   So far gastroenterology workup-AMA 20 ASMA is normal IgG is normal ANA is negative and hepatitis panel negative   6/19 EGD-EUS Impression:               - A single moderately severe localized biliary                            stricture was found in the lower third of the main                            bile duct. The stricture was benign appearing.                            Brushed for cytology.                           - The common bile duct was mildly dilated,                            secondary to the stricture.                           - One plastic stent was placed into the common bile  duct.                           - Prior pancreatic and biliary sphincterotomies                            were noted.                           - Two PD stents in good postion were noted - both                            appeared clogged. Recommendation:           - Return patient to hospital ward for ongoing care.                           - Observe patient's clinical course following                            today's ERCP with therapeutic intervention.                           - Clear liquid diet today.                           - Trend LFTs.                           - Await cytology.                           - Repeat ERCP with Dr. [name] planned on 7/25.  Hospital Course:  PD stricture status post PD stents in the past-possible central stricture with stent in place and worsening biliary ductal dilatation with caliber change of CBD distally Prior ethanolism with abstinent since 09/2020 ERCP/EGD as above Creon since 6/15 resumed-- Creon 72,000 and snack time Creon 36,000 Cont Compazine 5 every 4 as  needed nausea Pain seem to improve during hospital stay he was transitioned to oxycodone at his request see below   Mild hypokalemia-replaced , mag is ok   Diabetes mellitus TY 2-- uses Dexcom at home-baseline long-acting is 16 units with sliding scale--- currently --only 6 units of long-acting CBGs over controlled he was eating better and I expect he will need to go back on his 16 units and his specialist sliding scale   Elevated blood pressure without a diagnosis of hypertension On as needed hydralazine IV   Prior splenic venous thrombosis Not on anticoagulation any longer   Probable chronic pain from chronic pancreatitis Peeds to get these prescription filled by Dr. Hyacinth Meeker on discharge He sees Dr. Jodie Echevaria LE of pain management and I have reviewed the PDMP and he received 30-day supply of hydromorphone on 6/2 Expresses a preference for oxycodone so we have changed him to that as it seems to last a little longer He understands that he will not get more prescriptions beyond this and that he needs to follow-up with Dr. Conley Rolls   moderate malnutrition BMI 19   Discharge Exam: Vitals:   04/30/23 0419 04/30/23 0711  BP: (!) 150/100 (!) 139/101  Pulse: 61 74  Resp: 18 18  Temp: 98 F (36.7 C) 98 F (36.7 C)  SpO2: 100% 100%    Subj on day of d/c   Awake coherent no distress looks fair Watching TV on his phone Talking with his spouse Chest is clear Abdomen slightly tender No lower extremity edema  Discharge Instructions   Discharge Instructions     Diet - low sodium heart healthy   Complete by: As directed    Discharge instructions   Complete by: As directed    Make sure that you follow up with pain management and also Dr. Truett Perna your request you have been changed form Dilaudid to OXy--we will only give u an Rx until you can be seen c Pain management Eat and drink very Se[place] and digestible things--do not over do anything greasy as this is noxious to your belly  Best  of luck and enjoy the summer.   Increase activity slowly   Complete by: As directed       Allergies as of 04/30/2023       Reactions   Other Anaphylaxis, Other (See Comments)   [name] - anaphylaxis        Medication List     STOP taking these medications    acetaminophen 325 MG tablet Commonly known as: TYLENOL   HYDROmorphone 4 MG tablet Commonly known as: Dilaudid       TAKE these medications    Dexcom G6 Sensor Misc 1 Device by Other route as directed. Change sensors every 10 days What changed:  when to take this additional instructions   Dexcom G6 Transmitter Misc 1 Device by Does not apply route as directed.   insulin aspart 100 UNIT/ML injection Commonly known as: novoLOG 60 units a day What changed:  how much to take how to take this when to take this additional instructions   Insulin Pen Needle 32G X 4 MM Misc 1 Device by Does not apply route daily in the afternoon.   Lantus SoloStar 100 UNIT/ML Solostar Pen Generic drug: insulin glargine Inject 16 Units into the skin See admin instructions. Inject 16 units into the skin once a day as directed when Omnipod is not attached   lipase/protease/amylase 04540 UNITS Cpep capsule Commonly known as: Creon Take 2 capsules (72,000 Units total) by mouth 3 (three) times daily before meals. What changed:  how much to take when to take this additional instructions   multivitamin Tabs tablet Take 1 tablet by mouth daily with breakfast.   naloxone 4 MG/0.1ML Liqd nasal spray kit Commonly known as: NARCAN Place 1 spray into the nose once.   omeprazole 40 MG capsule Commonly known as: PRILOSEC Take 1 capsule (40 mg total) by mouth daily. What changed: when to take this   Omnipod 5 G6 Pods (Gen 5) Misc 1 Device by Does not apply route every 3 (three) days.   ondansetron 8 MG tablet Commonly known as: Zofran Take 1 tablet (8 mg total) by mouth every 8 (eight) hours as needed for nausea or  vomiting.   Oxycodone HCl 10 MG Tabs Take 1 tablet (10 mg total) by mouth every 4 (four) hours as needed for up to 5 days for moderate pain.       Allergies  Allergen Reactions   Other Anaphylaxis and Other (See Comments)    [name] - anaphylaxis    Follow-up Information     [name], MD. Schedule an appointment as soon as possible for a visit.  Specialties: Family Medicine, Emergency Medicine Why: Call the office a schedule a hospital follow up in the next 7-10 days. Contact information: [address] [place] [address] [phone]                  The results of significant diagnostics from this hospitalization (including imaging, microbiology, ancillary and laboratory) are listed below for reference.    Significant Diagnostic Studies: DG Abd 1 View  Result Date: 04/29/2023 CLINICAL DATA:  Abdominal pain EXAM: ABDOMEN - 1 VIEW COMPARISON:  Previous studies including MR abdomen done on 04/26/2023 and ERCP done on 04/28/2023 FINDINGS: Bowel gas pattern is nonspecific. Catheters are noted in the course of common bile duct and pancreatic duct. No abnormal masses or calcifications are seen. IMPRESSION: Nonspecific bowel gas pattern. Catheters are seen in the region of common bile duct and pancreatic duct. Electronically Signed   By: Ernie Avena M.D.   On: 04/29/2023 16:12   DG ERCP  Result Date: 04/28/2023 CLINICAL DATA:  ERCP EXAM: ERCP TECHNIQUE: Multiple spot images obtained with the fluoroscopic device and submitted for interpretation post-procedure. FLUOROSCOPY: See separate report COMPARISON:  None Available. FINDINGS: A total of 13 fluoroscopic spot images taken during ERCP are submitted for review. Initial images demonstrate a scope overlying the upper abdomen. A pancreatic duct stent appears in place. Wire catheterization of the common bile duct is performed. Contrast injection demonstrates a mildly dilated appearance of the common bile duct.  The intrahepatic biliary ducts are not well visualized. A biliary stent is placed, and the scope was withdrawn. IMPRESSION: ERCP images as described. Refer to separate report for full details. These images were submitted for radiologic interpretation only. Please see the procedural report for the amount of contrast and the fluoroscopy time utilized. Electronically Signed   By: Olive Bass M.D.   On: 04/28/2023 15:59   MR ABDOMEN MRCP W WO CONTAST  Result Date: 04/26/2023 CLINICAL DATA:  Elevated liver function tests. Jaundice. History of alcoholism, diabetes and chronic pancreatitis. Pancreatic duct stricture with stent. History of splenic vein thrombosis. EXAM: MRI ABDOMEN WITHOUT AND WITH CONTRAST (INCLUDING MRCP) TECHNIQUE: Multiplanar multisequence MR imaging of the abdomen was performed both before and after the administration of intravenous contrast. Heavily T2-weighted images of the biliary and pancreatic ducts were obtained, and three-dimensional MRCP images were rendered by post processing. CONTRAST:  7mL GADAVIST GADOBUTROL 1 MMOL/ML IV SOLN COMPARISON:  HIDA scan 04/25/2023. Ultrasound and CT 04/22/2023. Multiple older exams as well. MRI January 2024. FINDINGS: Lower chest: Trace pleural fluid bilaterally. Borderline size heart. Hepatobiliary: There is mild diffuse intrahepatic biliary ductal dilatation which is progressive from MRI of January 2024. Diameter of the common duct proximally approaches 6-7 mm. More distally as the duct approaches the pancreatic head there is an abrupt cutoff of the duct, presumed significant stricture. On postcontrast there is some wall thickening enhancement at the transition. There is also a cystic area just caudal to the narrowing of the duct in the pancreatic head measuring 2.1 x 1.5 cm. This has not seen in the MRI of January 2024. This was seen on the recent CT scan. The liver itself portal vein is patent but there is also several collaterals in the porta  hepatis. Please correlate with previous venous occlusion with some cavernous transformation and collaterals. No enhancing liver lesion. No restricted diffusion of the liver. Gallbladder is dilated.  Layering sludge.  Small stones. Pancreas: Pancreatic ductal dilatation towards the body/tail region measuring up to 6 mm,  severe. Caliber change along the midbody and neck region with a stricture. On recent CT scan there is a pancreatic duct stent which is not as well appreciated on this MRI but likely present based on the appearance. As described above there is a complex fluid collection with septa along the head of the pancreas. Please correlate for developing pseudocysts. Otherwise the pancreas on T2 is diffusely edematous with bright T2 signal and heterogeneous enhancement. There are areas of atrophy as well. Adjacent stranding. Please correlate for any component of acute on chronic pancreatitis. Spleen:  Within normal limits in size and appearance. Adrenals/Urinary Tract: No masses identified. No evidence of hydronephrosis. Stomach/Bowel: The visualized bowel is nondilated. There are some scattered stool. The stomach has areas of wall thickening along the body and antrum. Slight wall thickening as well along the area of the duodenal. Vascular/Lymphatic: Normal caliber aorta with some mild atherosclerotic plaque. Normal caliber IVC. There are several upper abdominal nodes. Example node in the porta hepatis measures 2.2 by 1.4 cm. Other nodes are borderline to slightly enlarged. There is narrowing of the main portal vein proximally near the portal venous confluence. The several collateral vessels throughout the retroperitoneum and upper abdomen. This includes porta hepatis, central mesentery and gastric cascade. Other:  Mesenteric stranding with trace ascites. Musculoskeletal: No suspicious bone lesions identified. IMPRESSION: Diffuse edema of the pancreas with atrophy. Adjacent stranding as well. Please correlate for  any clinical evidence of acute on chronic pancreatitis. The calcification seen by prior CT are less appreciated today. History of an indwelling pancreatic duct stent. There is severe dilatation of the duct towards the tail with a central stricture with a stent in place. Worsening biliary ductal dilatation with abrupt caliber change of the distal common duct as the duct enters the pancreatic head. In this location there is an adjacent complex cystic lesion, possibly evolving pseudocyst. True severe common duct stricture is possible either from mass effect or true intrinsic narrowing. There is some enhancing wall to the duct in this location. Please correlate with findings at ERCP. Distended/dilated gallbladder with sludge and stones. Several collateral vessels throughout the upper abdomen including the porta hepatis, central mesentery in the left upper quadrant with the areas of narrowing of the main portal vein near the portal venous confluence. Please correlate with previous venous occlusion. Several prominent to mildly enlarged upper abdominal lymph nodes. Attention on follow-up. Electronically Signed   By: Karen Kays M.D.   On: 04/26/2023 11:39   NM Hepatobiliary Liver Func  Result Date: 04/25/2023 CLINICAL DATA:  Elevated bilirubin.  Concern for cholecystitis. EXAM: NUCLEAR MEDICINE HEPATOBILIARY IMAGING TECHNIQUE: Sequential images of the abdomen were obtained out to 60 minutes following intravenous administration of radiopharmaceutical. RADIOPHARMACEUTICALS:  10.6 mCi Tc-63m  Choletec IV COMPARISON:  04/22/2023 FINDINGS: Prompt uptake of activity by the liver is seen. After 120 minutes of imaging no biliary or small-bowel activity identified with persistent tracer uptake throughout both lobes of the liver. IMPRESSION: 1. No signs of biliary or small bowel activity after 120 minutes of imaging with persistent uptake throughout the liver. Imaging findings are compatible with either common bile duct  obstruction or hepatocyte dysfunction. Electronically Signed   By: Signa Kell M.D.   On: 04/25/2023 15:46   US Abdomen Limited RUQ (LIVER/GB)  Result Date: 04/22/2023 CLINICAL DATA:  Right upper quadrant pain. EXAM: ULTRASOUND ABDOMEN LIMITED RIGHT UPPER QUADRANT COMPARISON:  CT 04/22/2023 FINDINGS: Gallbladder: Calcification along the nondependent edge of the gallbladder likely represents layering stones but  could indicate focal gallbladder wall calcification. Layering sludge in the gallbladder. Gallbladder wall is thickened at 4.6 mm. Murphy's sign is negative. Common bile duct: Diameter: 5 mm, normal Liver: Increased liver parenchymal echotexture suggesting fatty infiltration. No focal lesions. Portal vein is patent on color Doppler imaging with normal direction of blood flow towards the liver. Other: None. IMPRESSION: Gallbladder calcification, most likely gallstones but could indicate focal mural calcification. Layering sludge and wall thickening. Murphy's sign is negative. Changes are nonspecific for acute cholecystitis. Clinical correlation is suggested. Electronically Signed   By: Burman Nieves M.D.   On: 04/22/2023 20:15   CT ABDOMEN PELVIS W CONTRAST  Result Date: 04/22/2023 CLINICAL DATA:  Epigastric pain EXAM: CT ABDOMEN AND PELVIS WITH CONTRAST TECHNIQUE: Multidetector CT imaging of the abdomen and pelvis was performed using the standard protocol following bolus administration of intravenous contrast. RADIATION DOSE REDUCTION: This exam was performed according to the departmental dose-optimization program which includes automated exposure control, adjustment of the mA and/or kV according to patient size and/or use of iterative reconstruction technique. CONTRAST:  OMNIPAQUE IOHEXOL 300 MG/ML  SOLN COMPARISON:  CT of the abdomen without and with contrast 02/03/2023. FINDINGS: Lower chest: The lung bases are clear without focal nodule, mass, or airspace disease. Heart size is normal.  No significant pleural or pericardial effusion is present. Hepatobiliary: Mild fatty infiltration of the liver is again noted. No discrete lesions are present. Wall thickening is present about the gallbladder. Pancreas: Pancreatic duct stent is stable in position. Diffuse inflammatory changes are again noted about the head of the pancreas. No discrete cystic lesions are present. The pancreas enhances throughout. Spleen: Normal in size without focal abnormality. Adrenals/Urinary Tract: The adrenal glands are normal bilaterally. Kidneys are unremarkable. No stone or mass lesion is present. Ureters are normal bilaterally. The urinary bladder is within normal limits. Stomach/Bowel: Mild inflammatory changes are present the distal stomach and duodenum, likely secondary to the pancreatic inflammation. The small bowel is otherwise unremarkable. Terminal ileum is within normal limits. The appendix is not discretely visualized and may be surgically absent. The ascending and transverse colon are within normal limits. Descending and sigmoid colon are normal. Vascular/Lymphatic: No significant vascular findings are present. No enlarged abdominal or pelvic lymph nodes. Reproductive: Prostate is unremarkable. Other: A small amount of free fluid is present within the anatomic pelvis. No significant ventral hernia is present. Musculoskeletal: The vertebral body heights and alignment are normal. No focal osseous lesions are present. The hips are located. IMPRESSION: 1. Diffuse inflammatory changes about the head of the pancreas compatible with acute pancreatitis. No complicating features. 2. Pancreatic duct stent is stable in position. 3. Hepatic steatosis. 4. Small amount of free fluid within the anatomic pelvis is likely reactive. Electronically Signed   By: Marin Roberts M.D.   On: 04/22/2023 17:35    Microbiology: No results found for this or any previous visit (from the past 240 hour(s)).   Labs: Basic Metabolic  Panel: Recent Labs  Lab 04/25/23 0459 04/25/23 0500 04/26/23 0446 04/27/23 0254 04/28/23 0315 04/29/23 0606 04/30/23 0534  NA  --    < > 132* 132* 133* 132* 134*  K  --    < > 3.4* 3.4* 3.3* 3.8 3.7  CL  --    < > 99 101 100 95* 97*  CO2  --    < > 21* 24 24 27 27   GLUCOSE  --    < > 186* 136* 114* 149* 206*  BUN  --    < > <  5* <5* <5* <5* <5*  CREATININE  --    < > 0.86 0.72 0.78 0.88 0.94  CALCIUM  --    < > 8.6* 8.5* 8.5* 9.0 9.1  MG 1.7  --  1.7 1.8 1.6* 1.7 1.8  PHOS 3.3  --  3.4  --   --   --   --    < > = values in this interval not displayed.   Liver Function Tests: Recent Labs  Lab 04/26/23 0446 04/27/23 0254 04/28/23 0315 04/29/23 0606 04/30/23 0534  AST 458* 280* 168* 50* 40  ALT 225* 212* 189* 137* 113*  ALKPHOS 1,219* 1,050* 923* 861* 757*  BILITOT 15.8* 14.1* 10.2* 5.8* 5.1*  PROT 6.0* 5.7* 5.9* 6.4* 6.7  ALBUMIN 2.4* 2.2* 2.3* 2.5* 2.7*   Recent Labs  Lab 04/29/23 0915  LIPASE 21   No results for input(s): "AMMONIA" in the last 168 hours. CBC: Recent Labs  Lab 04/26/23 0446 04/27/23 0254 04/28/23 0315 04/29/23 0606 04/30/23 0534  WBC 6.9 6.7 5.6 5.9 4.8  HGB 12.3* 11.3* 10.7* 11.7* 11.5*  HCT 35.6* 31.5* 30.4* 33.2* 33.6*  MCV 97.8 96.0 97.1 100.0 99.7  PLT 220 236 249 323 348   Cardiac Enzymes: No results for input(s): "CKTOTAL", "CKMB", "CKMBINDEX", "TROPONINI" in the last 168 hours. BNP: BNP (last 3 results) No results for input(s): "BNP" in the last 8760 hours.  ProBNP (last 3 results) No results for input(s): "PROBNP" in the last 8760 hours.  CBG: Recent Labs  Lab 04/29/23 1947 04/30/23 0000 04/30/23 0513 04/30/23 0828 04/30/23 1124  GLUCAP 231* 237* 192* 180* 292*       Signed:  Rhetta Mura MD   [facility] 04/30/2023, 6:47 PM

## 2023-04-30 NOTE — Progress Notes (Signed)
[facility] Progress Note  CC:  Elevated LFTs and right upper quadrant pain    Subjective: He has been able to eat. Pain is still present but appears to be stable.   Objective:  Vital signs in last 24 hours: Temp:  [97.5 F (36.4 C)-98.2 F (36.8 C)] 98 F (36.7 C) (06/22 0711) Pulse Rate:  [61-74] 74 (06/22 0711) Resp:  [18] 18 (06/22 0711) BP: (134-150)/(77-101) 139/101 (06/22 0711) SpO2:  [100 %] 100 % (06/22 0711) Weight:  [71.5 kg] 71.5 kg (06/22 0646) Last BM Date : 04/28/23 General: Alert 37 year old male in no acute distress. Heart: Regular rate Pulm: No increased WOB Abdomen: Soft, nondistended.  Tender to palpation in the RUQ Extremities:  Without edema. Neurologic:  Alert and  oriented x 4. Grossly normal neurologically. Psych:  Alert and cooperative. Normal mood and affect.  Intake/Output from previous day: 06/21 0701 - 06/22 0700 In: 400 [IV Piggyback:400] Out: -  Intake/Output this shift: No intake/output data recorded.  Lab Results: Recent Labs    04/28/23 0315 04/29/23 0606 04/30/23 0534  WBC 5.6 5.9 4.8  HGB 10.7* 11.7* 11.5*  HCT 30.4* 33.2* 33.6*  PLT 249 323 348   BMET Recent Labs    04/28/23 0315 04/29/23 0606 04/30/23 0534  NA 133* 132* 134*  K 3.3* 3.8 3.7  CL 100 95* 97*  CO2 24 27 27   GLUCOSE 114* 149* 206*  BUN <5* <5* <5*  CREATININE 0.78 0.88 0.94  CALCIUM 8.5* 9.0 9.1   LFT Recent Labs    04/30/23 0534  PROT 6.7  ALBUMIN 2.7*  AST 40  ALT 113*  ALKPHOS 757*  BILITOT 5.1*   PT/INR No results for input(s): "LABPROT", "INR" in the last 72 hours. Hepatitis Panel No results for input(s): "HEPBSAG", "HCVAB", "HEPAIGM", "HEPBIGM" in the last 72 hours.  DG Abd 1 View  Result Date: 04/29/2023 CLINICAL DATA:  Abdominal pain EXAM: ABDOMEN - 1 VIEW COMPARISON:  Previous studies including MR abdomen done on 04/26/2023 and ERCP done on 04/28/2023 FINDINGS: Bowel gas pattern is nonspecific. Catheters are  noted in the course of common bile duct and pancreatic duct. No abnormal masses or calcifications are seen. IMPRESSION: Nonspecific bowel gas pattern. Catheters are seen in the region of common bile duct and pancreatic duct. Electronically Signed   By: Ernie Avena M.D.   On: 04/29/2023 16:12   DG ERCP  Result Date: 04/28/2023 CLINICAL DATA:  ERCP EXAM: ERCP TECHNIQUE: Multiple spot images obtained with the fluoroscopic device and submitted for interpretation post-procedure. FLUOROSCOPY: See separate report COMPARISON:  None Available. FINDINGS: A total of 13 fluoroscopic spot images taken during ERCP are submitted for review. Initial images demonstrate a scope overlying the upper abdomen. A pancreatic duct stent appears in place. Wire catheterization of the common bile duct is performed. Contrast injection demonstrates a mildly dilated appearance of the common bile duct. The intrahepatic biliary ducts are not well visualized. A biliary stent is placed, and the scope was withdrawn. IMPRESSION: ERCP images as described. Refer to separate report for full details. These images were submitted for radiologic interpretation only. Please see the procedural report for the amount of contrast and the fluoroscopy time utilized. Electronically Signed   By: Olive Bass M.D.   On: 04/28/2023 15:59    Assessment / Plan:  37 year old male with a history of chronic pancreatitis with PD stricture s/p PD stent with history of alcohol use disorder (abstinent from alcohol since 09/2020). MRI/MRCP 04/25/2023  showed acute-on-chronic pancreatitis with severe dilation of the pancreatic duct towards the tail with a central stricture with a stent in place with worsening biliary ductal dilation with a caliber change of the distal common bile duct as the duct enters the pancreatic head with an adjacent complex eccentric lesion possibly evolving pseudocyst. ERCP 6/20 showed a distal CBD stricture c/w findings on MRCP and two  indwelling PD stents in good position. CBD stricture brushed for cytology although suspect this a benign CBD stricture due to chronic pancreatitis. One plastic biliary stent placed into the CBD.  LFTs appear to be decreasing, suggesting that the CBD stent has relieved the patient's biliary obstruction. AMA was equivocal during this hospitalization, but I would see how his alk phos trends over time (prior to this hospitalization his alk phos was normal). No signs of post-ERCP complications at this time. -Follow up cytology of CBD stricture brushings -Okay to complete 5 days of antibiotics (can transition to Augmentin) s/p ERCP. Can transition to PO once patient is ready for discharge -Continue PPI IV bid. Okay to transition to PO -Continue Creon Q AC and with snacks -Pain management per the hospitalist -Await further recommendations per Dr. Leonides Schanz -Repeat hepatic panel in 1 week post hospital discharge  -Has follow up appt with Dr. [name] on 7/17 -Repeat ERCP for PD stent exchange with Dr. [name] scheduled 06/02/2023. Eventually will require removal of biliary stent, may need to be done at time of PD exchange -GI will sign off for now. Please call back if any new questions arise.  Principal Problem:   Acute on chronic pancreatitis (HCC) Active Problems:   Elevated LFTs   Uncontrolled type 1 diabetes mellitus with hyperglycemia, with long-term current use of insulin (HCC)   Stricture of pancreatic duct   Common bile duct (CBD) stricture   Abnormal magnetic resonance cholangiopancreatography (MRCP)     LOS: 8 days   Carlos [name]  04/30/2023, 3:54PM

## 2023-04-30 NOTE — Plan of Care (Signed)

## 2023-05-02 ENCOUNTER — Telehealth: Payer: Self-pay

## 2023-05-02 LAB — CYTOLOGY - NON PAP

## 2023-05-02 NOTE — Transitions of Care (Post Inpatient/ED Visit) (Signed)
   05/02/2023  Name: Carlos [name] MRN: [id number] DOB: December 25, 1985  Today's TOC FU Call Status:  Patient will follow up with pre-op on 7/24-25th prior to surgery. Nothing else follows at this time  Attempted to reach the patient regarding the most recent Inpatient/ED visit.  Follow Up Plan: No further outreach attempts will be made at this time. We have been unable to contact the patient.  Signature :Guss Bunde [facility]

## 2023-05-02 NOTE — Anesthesia Postprocedure Evaluation (Signed)
Anesthesia Post Note  Patient: Rogen Porte  Procedure(s) Performed: ENDOSCOPIC RETROGRADE CHOLANGIOPANCREATOGRAPHY (ERCP) BILIARY BRUSHING BILIARY STENT PLACEMENT     Patient location during evaluation: Endoscopy Anesthesia Type: General Level of consciousness: awake and alert Pain management: pain level controlled Vital Signs Assessment: post-procedure vital signs reviewed and stable Respiratory status: spontaneous breathing, nonlabored ventilation, respiratory function stable and patient connected to nasal cannula oxygen Cardiovascular status: blood pressure returned to baseline and stable Postop Assessment: no apparent nausea or vomiting Anesthetic complications: no   No notable events documented.  Last Vitals:  Vitals:   04/30/23 0419 04/30/23 0711  BP: (!) 150/100 (!) 139/101  Pulse: 61 74  Resp: 18 18  Temp: 36.7 C 36.7 C  SpO2: 100% 100%    Last Pain:  Vitals:   04/30/23 1128  TempSrc:   PainSc: 0-No pain                 Jayde Mcallister

## 2023-05-03 ENCOUNTER — Encounter (HOSPITAL_COMMUNITY): Payer: Self-pay | Admitting: Gastroenterology

## 2023-05-03 ENCOUNTER — Other Ambulatory Visit: Payer: Self-pay | Admitting: Family

## 2023-05-03 DIAGNOSIS — G8929 Other chronic pain: Secondary | ICD-10-CM

## 2023-05-09 ENCOUNTER — Encounter: Payer: Self-pay | Admitting: Family Medicine

## 2023-05-09 ENCOUNTER — Other Ambulatory Visit (HOSPITAL_COMMUNITY): Payer: Self-pay

## 2023-05-09 ENCOUNTER — Other Ambulatory Visit: Payer: Self-pay | Admitting: Family Medicine

## 2023-05-09 DIAGNOSIS — G8929 Other chronic pain: Secondary | ICD-10-CM

## 2023-05-09 NOTE — Telephone Encounter (Signed)
Patient is requesting a refill of the following medications: Requested Prescriptions   Pending Prescriptions Disp Refills   HYDROmorphone (DILAUDID) 4 MG tablet 30 tablet 0    Sig: Take 1 tablet (4 mg total) by mouth every 4 (four) hours as needed for severe pain.    Date of last refill:04/06/2023 but medication was then discontinued  Refill amount: 30 tablets 0 refills   Treatment agreement date: 01/07/2023

## 2023-05-10 ENCOUNTER — Other Ambulatory Visit (HOSPITAL_COMMUNITY): Payer: Self-pay

## 2023-05-10 MED ORDER — HYDROMORPHONE HCL 4 MG PO TABS
4.0000 mg | ORAL_TABLET | ORAL | 0 refills | Status: DC | PRN
Start: 2023-05-10 — End: 2023-05-16
  Filled 2023-05-10: qty 30, 5d supply, fill #0

## 2023-05-16 ENCOUNTER — Other Ambulatory Visit (HOSPITAL_BASED_OUTPATIENT_CLINIC_OR_DEPARTMENT_OTHER): Payer: Self-pay

## 2023-05-16 MED ORDER — HYDROMORPHONE HCL 4 MG PO TABS
4.0000 mg | ORAL_TABLET | ORAL | 0 refills | Status: DC | PRN
  Filled 2023-05-16: qty 180, 30d supply, fill #0

## 2023-05-23 NOTE — Progress Notes (Signed)
Called number listed and person that answered stated they don't know a [name]. Attempted to call contact number with no answer.

## 2023-05-25 ENCOUNTER — Ambulatory Visit: Payer: Medicaid Other | Admitting: Gastroenterology

## 2023-05-25 ENCOUNTER — Telehealth: Payer: Self-pay | Admitting: Gastroenterology

## 2023-05-25 NOTE — Telephone Encounter (Signed)
The pt missed his office appt and wants to be sure he can still have ERCP on 7/25.

## 2023-05-25 NOTE — Telephone Encounter (Signed)
I believe [facility] spoke with the patient this morning. He will have his procedure as scheduled next week. GM

## 2023-05-25 NOTE — Telephone Encounter (Signed)
Left message on machine to call back  

## 2023-05-25 NOTE — Telephone Encounter (Signed)
Inbound call from patient regarding his appointment for 7/25 at the hospital , he had an appt today at 8:30 am  but he forgot and want to know if he would be able to proceed with appt on  7/25.Please advise

## 2023-06-02 ENCOUNTER — Ambulatory Visit (HOSPITAL_COMMUNITY): Payer: Medicaid Other

## 2023-06-02 ENCOUNTER — Other Ambulatory Visit: Payer: Self-pay

## 2023-06-02 ENCOUNTER — Encounter (HOSPITAL_COMMUNITY)
Admission: RE | Disposition: A | Discharge status: Home / Self Care | Payer: Self-pay | Source: Home / Self Care | Attending: Gastroenterology

## 2023-06-02 ENCOUNTER — Telehealth: Payer: Self-pay

## 2023-06-02 ENCOUNTER — Encounter (HOSPITAL_COMMUNITY): Payer: Self-pay | Admitting: Gastroenterology

## 2023-06-02 ENCOUNTER — Ambulatory Visit (HOSPITAL_COMMUNITY)
Admission: RE | Admit: 2023-06-02 | Discharge: 2023-06-02 | Disposition: A | Discharge status: Home / Self Care | Payer: Medicaid Other | Attending: Gastroenterology | Admitting: Gastroenterology

## 2023-06-02 ENCOUNTER — Telehealth: Payer: Self-pay | Admitting: Gastroenterology

## 2023-06-02 ENCOUNTER — Ambulatory Visit (HOSPITAL_BASED_OUTPATIENT_CLINIC_OR_DEPARTMENT_OTHER): Payer: Medicaid Other

## 2023-06-02 DIAGNOSIS — K8689 Other specified diseases of pancreas: Secondary | ICD-10-CM

## 2023-06-02 DIAGNOSIS — Z9641 Presence of insulin pump (external) (internal): Secondary | ICD-10-CM | POA: Diagnosis not present

## 2023-06-02 DIAGNOSIS — Z4659 Encounter for fitting and adjustment of other gastrointestinal appliance and device: Secondary | ICD-10-CM

## 2023-06-02 DIAGNOSIS — E1165 Type 2 diabetes mellitus with hyperglycemia: Secondary | ICD-10-CM

## 2023-06-02 DIAGNOSIS — K861 Other chronic pancreatitis: Secondary | ICD-10-CM | POA: Diagnosis not present

## 2023-06-02 DIAGNOSIS — K831 Obstruction of bile duct: Secondary | ICD-10-CM

## 2023-06-02 DIAGNOSIS — F17290 Nicotine dependence, other tobacco product, uncomplicated: Secondary | ICD-10-CM | POA: Diagnosis not present

## 2023-06-02 DIAGNOSIS — G8929 Other chronic pain: Secondary | ICD-10-CM

## 2023-06-02 DIAGNOSIS — E109 Type 1 diabetes mellitus without complications: Secondary | ICD-10-CM | POA: Insufficient documentation

## 2023-06-02 DIAGNOSIS — Z794 Long term (current) use of insulin: Secondary | ICD-10-CM | POA: Diagnosis not present

## 2023-06-02 DIAGNOSIS — T85590A Other mechanical complication of bile duct prosthesis, initial encounter: Secondary | ICD-10-CM

## 2023-06-02 HISTORY — PX: PANCREATIC STENT PLACEMENT: SHX5539

## 2023-06-02 HISTORY — PX: BILIARY BRUSHING: SHX6843

## 2023-06-02 HISTORY — PX: ENDOSCOPIC RETROGRADE CHOLANGIOPANCREATOGRAPHY (ERCP) WITH PROPOFOL: SHX5810

## 2023-06-02 HISTORY — PX: BILIARY STENT PLACEMENT: SHX5538

## 2023-06-02 HISTORY — PX: STENT REMOVAL: SHX6421

## 2023-06-02 HISTORY — PX: SPHINCTEROTOMY: SHX5279

## 2023-06-02 HISTORY — PX: REMOVAL OF STONES: SHX5545

## 2023-06-02 LAB — CBC
HCT: 42.7 % (ref 39.0–52.0)
Hemoglobin: 14.7 g/dL (ref 13.0–17.0)
MCH: 34.2 pg — ABNORMAL HIGH (ref 26.0–34.0)
MCHC: 34.4 g/dL (ref 30.0–36.0)
MCV: 99.3 fL (ref 80.0–100.0)
Platelets: 225 10*3/uL (ref 150–400)
RBC: 4.3 MIL/uL (ref 4.22–5.81)
RDW: 11.9 % (ref 11.5–15.5)
WBC: 3.9 10*3/uL — ABNORMAL LOW (ref 4.0–10.5)
nRBC: 0 % (ref 0.0–0.2)

## 2023-06-02 LAB — COMPREHENSIVE METABOLIC PANEL
ALT: 22 U/L (ref 0–44)
AST: 66 U/L — ABNORMAL HIGH (ref 15–41)
Albumin: 3.5 g/dL (ref 3.5–5.0)
Alkaline Phosphatase: 162 U/L — ABNORMAL HIGH (ref 38–126)
Anion gap: 11 (ref 5–15)
BUN: 9 mg/dL (ref 6–20)
CO2: 25 mmol/L (ref 22–32)
Calcium: 8.6 mg/dL — ABNORMAL LOW (ref 8.9–10.3)
Chloride: 99 mmol/L (ref 98–111)
Creatinine, Ser: 0.97 mg/dL (ref 0.61–1.24)
GFR, Estimated: >60 mL/min (ref >60)
Glucose, Bld: 339 mg/dL — ABNORMAL HIGH (ref 70–99)
Potassium: 3.9 mmol/L (ref 3.5–5.1)
Sodium: 135 mmol/L (ref 135–145)
Total Bilirubin: 1.2 mg/dL (ref 0.3–1.2)
Total Protein: 7 g/dL (ref 6.5–8.1)

## 2023-06-02 LAB — AMYLASE: Amylase: 52 U/L (ref 28–100)

## 2023-06-02 LAB — GLUCOSE, CAPILLARY
Glucose-Capillary: 257 mg/dL — ABNORMAL HIGH (ref 70–99)
Glucose-Capillary: 275 mg/dL — ABNORMAL HIGH (ref 70–99)

## 2023-06-02 LAB — LIPASE, BLOOD: Lipase: 45 U/L (ref 11–51)

## 2023-06-02 SURGERY — ENDOSCOPIC RETROGRADE CHOLANGIOPANCREATOGRAPHY (ERCP) WITH PROPOFOL
Anesthesia: General

## 2023-06-02 MED ORDER — PROPOFOL 10 MG/ML IV BOLUS
INTRAVENOUS | Status: DC | PRN
Start: 2023-06-02 — End: 2023-06-02
  Administered 2023-06-02: 50 mg via INTRAVENOUS
  Administered 2023-06-02: 150 mg via INTRAVENOUS

## 2023-06-02 MED ORDER — CIPROFLOXACIN IN D5W 400 MG/200ML IV SOLN
INTRAVENOUS | Status: DC | PRN
  Administered 2023-06-02: 400 mg via INTRAVENOUS

## 2023-06-02 MED ORDER — DICLOFENAC SUPPOSITORY 100 MG
RECTAL | Status: DC | PRN
  Administered 2023-06-02: 100 mg via RECTAL

## 2023-06-02 MED ORDER — MIDAZOLAM HCL 2 MG/2ML IJ SOLN
INTRAMUSCULAR | Status: AC
  Filled 2023-06-02: qty 2

## 2023-06-02 MED ORDER — FENTANYL CITRATE (PF) 100 MCG/2ML IJ SOLN
INTRAMUSCULAR | Status: AC
  Filled 2023-06-02: qty 2

## 2023-06-02 MED ORDER — GLUCAGON HCL RDNA (DIAGNOSTIC) 1 MG IJ SOLR
INTRAMUSCULAR | Status: DC | PRN
  Administered 2023-06-02 (×4): .25 mg via INTRAVENOUS

## 2023-06-02 MED ORDER — GLUCAGON HCL RDNA (DIAGNOSTIC) 1 MG IJ SOLR
INTRAMUSCULAR | Status: AC
  Filled 2023-06-02: qty 1

## 2023-06-02 MED ORDER — LIDOCAINE HCL (CARDIAC) PF 100 MG/5ML IV SOSY
PREFILLED_SYRINGE | INTRAVENOUS | Status: DC | PRN
  Administered 2023-06-02: 60 mg via INTRAVENOUS

## 2023-06-02 MED ORDER — MIDAZOLAM HCL 5 MG/5ML IJ SOLN
INTRAMUSCULAR | Status: DC | PRN
  Administered 2023-06-02: 2 mg via INTRAVENOUS

## 2023-06-02 MED ORDER — ROCURONIUM BROMIDE 100 MG/10ML IV SOLN
INTRAVENOUS | Status: DC | PRN
  Administered 2023-06-02: 50 mg via INTRAVENOUS

## 2023-06-02 MED ORDER — DICLOFENAC SUPPOSITORY 100 MG
RECTAL | Status: AC
  Filled 2023-06-02: qty 1

## 2023-06-02 MED ORDER — HYDROMORPHONE HCL 1 MG/ML IJ SOLN
0.5000 mg | Freq: Once | INTRAMUSCULAR | Status: AC
  Administered 2023-06-02: 0.5 mg via INTRAVENOUS

## 2023-06-02 MED ORDER — SODIUM CHLORIDE 0.9 % IV SOLN: INTRAVENOUS | Status: DC

## 2023-06-02 MED ORDER — HYDROMORPHONE HCL 4 MG PO TABS: 4.0000 mg | ORAL_TABLET | ORAL | 0 refills | Status: DC | PRN

## 2023-06-02 MED ORDER — HYDROMORPHONE HCL 1 MG/ML IJ SOLN
INTRAMUSCULAR | Status: AC
  Filled 2023-06-02: qty 1

## 2023-06-02 MED ORDER — FENTANYL CITRATE (PF) 100 MCG/2ML IJ SOLN
INTRAMUSCULAR | Status: DC | PRN
  Administered 2023-06-02: 25 ug via INTRAVENOUS
  Administered 2023-06-02: 50 ug via INTRAVENOUS
  Administered 2023-06-02 (×3): 25 ug via INTRAVENOUS
  Administered 2023-06-02: 50 ug via INTRAVENOUS

## 2023-06-02 MED ORDER — OXYCODONE-ACETAMINOPHEN 5-325 MG PO TABS: 1.0000 | ORAL_TABLET | Freq: Four times a day (QID) | ORAL | 0 refills | Status: AC | PRN

## 2023-06-02 MED ORDER — FENTANYL CITRATE (PF) 100 MCG/2ML IJ SOLN
25.0000 ug | Freq: Once | INTRAMUSCULAR | Status: AC
  Administered 2023-06-02: 25 ug via INTRAVENOUS

## 2023-06-02 MED ORDER — CIPROFLOXACIN IN D5W 400 MG/200ML IV SOLN
INTRAVENOUS | Status: AC
  Filled 2023-06-02: qty 200

## 2023-06-02 MED ORDER — SODIUM CHLORIDE 0.9 % IV SOLN
INTRAVENOUS | Status: DC | PRN
  Administered 2023-06-02: 30 mL

## 2023-06-02 MED ORDER — LACTATED RINGERS IV SOLN: INTRAVENOUS | Status: DC | PRN

## 2023-06-02 MED ORDER — ESMOLOL HCL 100 MG/10ML IV SOLN
INTRAVENOUS | Status: DC | PRN
  Administered 2023-06-02: 20 mg via INTRAVENOUS

## 2023-06-02 MED ORDER — OXYCODONE-ACETAMINOPHEN 10-325 MG PO TABS: 1.0000 | ORAL_TABLET | Freq: Four times a day (QID) | ORAL | 0 refills | Status: DC | PRN

## 2023-06-02 MED ORDER — SUGAMMADEX SODIUM 200 MG/2ML IV SOLN
INTRAVENOUS | Status: DC | PRN
  Administered 2023-06-02 (×2): 100 mg via INTRAVENOUS

## 2023-06-02 MED ORDER — PROPOFOL 10 MG/ML IV BOLUS
INTRAVENOUS | Status: AC
  Filled 2023-06-02: qty 20

## 2023-06-02 MED ORDER — ONDANSETRON HCL 4 MG/2ML IJ SOLN
INTRAMUSCULAR | Status: DC | PRN
Start: 2023-06-02 — End: 2023-06-02
  Administered 2023-06-02: 4 mg via INTRAVENOUS

## 2023-06-02 NOTE — Discharge Instructions (Signed)
YOU HAD AN ENDOSCOPIC PROCEDURE TODAY: Refer to the procedure report and other information in the discharge instructions given to you for any specific questions about what was found during the examination. If this information does not answer your questions, please call [place] office at [phone] to clarify.  ° °YOU SHOULD EXPECT: Some feelings of bloating in the abdomen. Passage of more gas than usual. Walking can help get rid of the air that was put into your GI tract during the procedure and reduce the bloating. If you had a lower endoscopy (such as a colonoscopy or flexible sigmoidoscopy) you may notice spotting of blood in your stool or on the toilet paper. Some abdominal soreness may be present for a day or two, also. ° °DIET: Your first meal following the procedure should be a light meal and then it is ok to progress to your normal diet. A half-sandwich or bowl of soup is an example of a good first meal. Heavy or fried foods are harder to digest and may make you feel nauseous or bloated. Drink plenty of fluids but you should avoid alcoholic beverages for 24 hours. If you had a esophageal dilation, please see attached instructions for diet.   ° °ACTIVITY: Your care partner should take you home directly after the procedure. You should plan to take it easy, moving slowly for the rest of the day. You can resume normal activity the day after the procedure however YOU SHOULD NOT DRIVE, use power tools, machinery or perform tasks that involve climbing or major physical exertion for 24 hours (because of the sedation medicines used during the test).  ° °SYMPTOMS TO REPORT IMMEDIATELY: °A gastroenterologist can be reached at any hour. Please call [phone]  for any of the following symptoms:  °Following lower endoscopy (colonoscopy, flexible sigmoidoscopy) °Excessive amounts of blood in the stool  °Significant tenderness, worsening of abdominal pains  °Swelling of the abdomen that is new, acute  °Fever of 100° or  higher  °Following upper endoscopy (EGD, EUS, ERCP, esophageal dilation) °Vomiting of blood or coffee ground material  °New, significant abdominal pain  °New, significant chest pain or pain under the shoulder blades  °Painful or persistently difficult swallowing  °New shortness of breath  °Black, tarry-looking or red, bloody stools ° °FOLLOW UP:  °If any biopsies were taken you will be contacted by phone or by letter within the next 1-3 weeks. Call [phone]  if you have not heard about the biopsies in 3 weeks.  °Please also call with any specific questions about appointments or follow up tests. ° °

## 2023-06-02 NOTE — Op Note (Addendum)
[facility] Patient Name: Carlos [name] Procedure Date: 06/02/2023 MRN: [id number] Attending MD: [name] , MD, [id number] Date of Birth: 05/07/1986 CSN: [id number] Age: 37 Admit Type: Outpatient Procedure:                ERCP Indications:              Common bile duct stricture, Pancreatic duct stone,                            Pancreatic duct stricture, Abnormal abdominal CT,                            Elevated liver enzymes Providers:                [name], MD, Doristine Mango, RN, Priscella Mann, Technician Referring MD:             Bertram Millard. Miller Medicines:                General Anesthesia, Cipro 400 mg IV, Diclofenac 100                            mg rectal, Glucagon 1 mg IV Complications:            No immediate complications. Estimated Blood Loss:     Estimated blood loss was minimal. Procedure:                Pre-Anesthesia Assessment:                           - Prior to the procedure, a History and Physical                            was performed, and patient medications and                            allergies were reviewed. The patient's tolerance of                            previous anesthesia was also reviewed. The risks                            and benefits of the procedure and the sedation                            options and risks were discussed with the patient.                            All questions were answered, and informed consent                            was obtained. Prior Anticoagulants: The patient has  taken no anticoagulant or antiplatelet agents. ASA                            Grade Assessment: III - A patient with severe                            systemic disease. After reviewing the risks and                            benefits, the patient was deemed in satisfactory                            condition to undergo the procedure.                            After obtaining informed consent, the scope was                            passed under direct vision. Throughout the                            procedure, the patient's blood pressure, pulse, and                            oxygen saturations were monitored continuously. The                            [employer] D single use                            duodenoscope was introduced through the mouth, and                            used to inject contrast into and used to inject                            contrast into the bile duct and ventral pancreatic                            duct. The ERCP was accomplished without difficulty.                            The patient tolerated the procedure. Scope In: Scope Out: Findings:      A scout film of the abdomen was obtained. One stent ending in the main       bile duct was seen. Two stents ending in the main pancreatic duct were       seen.      The esophagus was successfully intubated under direct vision without       detailed examination of the pharynx, larynx, and associated structures,       and upper GI tract. Inspection of the major papilla revealed that       biliary and pancreatic sphincterotomies had been performed previously.       The biliary sphincterotomy  appeared open. The pancreatic sphincterotomy       appeared open. One plastic biliary stent originating in the biliary tree       was emerging from the major papilla. The stent was visibly patent. Two       plastic pancreatic stents originating in the pancreatic duct were       emerging from the major papilla. The pancreatic stents were partially       occluded. All 3 of the stents were removed from the biliary tree and the       pancreatic duct using a snare. The biliary tree stent was sent for       cytology purposes.      A short 0.035 inch Soft Jagwire was passed into the biliary tree. The       Hydratome sphincterotome was passed over the guidewire and the  bile duct       was then deeply cannulated. Contrast was injected. I personally       interpreted the bile duct images. Ductal flow of contrast was adequate.       Image quality was adequate. Contrast extended to the hepatic ducts.       Opacification of the main bile duct was successful. The middle third of       the main bile duct and upper third of the main bile duct were mildly       dilated, acquired. The largest diameter was 11mm. The lower third of the       main bile duct contained a single moderate stenosis 10 mm in length. To       discover objects, the biliary tree was swept with a retrieval balloon.       Sludge was swept from the duct. The biliary sphincterotomy was extended       to a total of 4 mm in length with a monofilament Hydratome       sphincterotome using ERBE electrocautery. There was no       post-sphincterotomy bleeding. Cells for cytology were obtained by       brushing in the lower third of the main bile duct. An occlusion       cholangiogram was performed that showed no further significant biliary       pathology. One 10 Fr by 5 cm plastic biliary stent with a single       external flap and a single internal flap was placed into the common bile       duct. The stent was in good position. I tried to place a second biliary       stent even with the extended sphincterotomy and had difficulty so I       stopped and remove that second 10 [place] by 5 cm biliary stent.      A short 0.035 inch Soft Jagwire was passed into the ventral pancreatic       duct. The ventral pancreatic duct was then deeply cannulated with the       Hydratome sphincterotome. Contrast was injected. Opacification of the       entire pancreatic ductal system was successful. Contrast was injected. I       personally interpreted the pancreatic duct images. Ductal flow of       contrast was adequate. Image quality was adequate. Contrast extended to       the pancreatic duct. The maximum diameter of  the ducts was 3 to 4 mm.  The ventral pancreatic duct in the head of the pancreas contained       filling defects suggestive of PD stones versus sludge. To find objects       the ventral pancreatic duct was swept with a retrieval balloon starting       at the pancreatic duct in the body of the pancreas. A few stones were       removed. No stones remained. I did not see overt evidence of a       persisting pancreatic duct stricture and could see contrast moving       towards the body/tail region. One 4 Fr by 9 cm temporary plastic       pancreatic stent with a single external flap was placed into the ventral       pancreatic duct. The stent was in good position.      The duodenoscope was withdrawn from the patient. Impression:               - Prior biliary sphincterotomy appeared open.                            Biliary stent was in position. This was removed and                            sent for cytology.                           - Prior pancreatic sphincterotomy appeared open. 2                            pancreatic stents were in position. These were                            removed.                           - A single moderate biliary stricture was found in                            the lower third of the main bile duct. The                            stricture was inflammatory in appearance. This was                            brushed for cytology (previous brushings were                            negative).                           - The upper third of the main bile duct and middle                            third of the main bile duct were mildly dilated,  likely due to the inflammatory appearing stricture                            noted above.                           - The biliary tree was swept and sludge was found.                            The gallbladder began to fill on cholangiogram.                           - Biliary  sphincterotomy extension was performed.                           - One plastic biliary stent was placed into the                            common bile duct. Had difficulty placing 2 separate                            biliary stents so just the left one in place.                           - A filling defect consistent with sludge was seen                            on the ventral pancreatogram. The pancreatic duct                            was swept with findings of multiple pancreatic                            stones. Further pancreatogram did not suggest                            significant stricturing still present.                           - One temporary plastic pancreatic stent was placed                            into the ventral pancreatic duct to decrease post                            ERCP pancreatitis risk. Moderate Sedation:      Not Applicable - Patient had care per Anesthesia. Recommendation:           - The patient will be observed post-procedure,                            until all discharge criteria are met.                           -  Discharge patient to home, pending patient's pain                            level at time of recovery (preprocedure pain was                            7-8 out of 10). Otherwise patient will need                            hospitalization for observation postprocedure.                           - Patient has a contact number available for                            emergencies. The signs and symptoms of potential                            delayed complications were discussed with the                            patient. Return to normal activities tomorrow.                            Written discharge instructions were provided to the                            patient.                           - Low fat diet.                           - Observe patient's clinical course.                           - Await cytology results.                            - Follow-up LFTs and amylase/lipase that were sent                            preprocedure.                           - Watch for pancreatitis, bleeding, perforation,                            and cholangitis.                           - Recommend repeat imaging cross-sectionally of a                            CT abdomen pancreas protocol versus MRI  abdomen/MRCP in 4 to 6 weeks. I have some concerns                            that the biliary stricture may be a result of the                            previously noted pancreatic head cyst rather than                            just inflammatory in nature. This will help discern                            whether this individual may end up needing a cyst                            aspiration versus needing a metal stent to be                            placed.                           - Repeat ERCP in 4 to 6 months for biliary stent                            removal (though likely other procedure will be                            indicated prior. pending on the results of the                            cross-sectional imaging).                           - Patient will be sent home with 3-days worth of                            Percocet every 6 hour to help get him over the hump                            of the risk of postprocedural pancreatitis and to                            try to keep him from having to come into the                            hospital. He knows to not take any Dilaudid during                            this time. He will then restart his Dilaudid in 3                            days. We will forward  our ERCP note to his pain                            specialist as well, as he is only obtaining extra                            pain medication due to his procedure..                           - The findings and recommendations were discussed                             with the patient.                           - The findings and recommendations were discussed                            with the designated responsible adult. Procedure Code(s):        --- Professional ---                           [phone], Endoscopic retrograde                            cholangiopancreatography (ERCP); with removal and                            exchange of stent(s), biliary or pancreatic duct,                            including pre- and post-dilation and guide wire                            passage, when performed, including sphincterotomy,                            when performed, each stent exchanged                           [id number], 59, Endoscopic retrograde                            cholangiopancreatography (ERCP); with removal and                            exchange of stent(s), biliary or pancreatic duct,                            including pre- and post-dilation and guide wire                            passage, when performed, including sphincterotomy,                            when performed,  each stent exchanged                           [id number], Endoscopic retrograde                            cholangiopancreatography (ERCP); with removal of                            calculi/debris from biliary/pancreatic duct(s)                           74330, 26, Combined endoscopic catheterization of                            the biliary and pancreatic ductal systems,                            radiological supervision and interpretation Diagnosis Code(s):        --- Professional ---                           Z96.89, Presence of other specified functional                            implants                           T85.590A, Other mechanical complication of bile                            duct prosthesis, initial encounter                           K83.1, Obstruction of bile duct                           K86.89, Other specified diseases of pancreas                            Z46.59, Encounter for fitting and adjustment of                            other gastrointestinal appliance and device                           R74.8, Abnormal levels of other serum enzymes                           K83.8, Other specified diseases of biliary tract                           R93.5, Abnormal findings on diagnostic imaging of                            other abdominal regions, including retroperitoneum  R93.2, Abnormal findings on diagnostic imaging of                            liver and biliary tract CPT copyright 2022 [employer]. All rights reserved. The codes documented in this report are preliminary and upon coder review may  be revised to meet current compliance requirements. [name], MD 06/02/2023 10:46:49 AM Number of Addenda: 0

## 2023-06-02 NOTE — Anesthesia Preprocedure Evaluation (Signed)
Anesthesia Evaluation  Patient identified by MRN, date of birth, ID band Patient awake    Reviewed: Allergy & Precautions, NPO status , Patient's Chart, lab work & pertinent test results  Airway Mallampati: I  TM Distance: >3 FB Neck ROM: Full    Dental  (+) Dental Advisory Given, Chipped,    Pulmonary Current Smoker and Patient abstained from smoking.   Pulmonary exam normal breath sounds clear to auscultation       Cardiovascular Normal cardiovascular exam Rhythm:Regular Rate:Normal  TTE 2021  1. Left ventricular ejection fraction, by estimation, is 60 to 65%. The  left ventricle has normal function. The left ventricle has no regional  wall motion abnormalities. There is mild left ventricular hypertrophy.  Left ventricular diastolic parameters  were normal.   2. Right ventricular systolic function is normal. The right ventricular  size is normal.   3. The mitral valve is normal in structure. No evidence of mitral valve  regurgitation. No evidence of mitral stenosis.   4. The aortic valve is tricuspid. Aortic valve regurgitation is not  visualized. No aortic stenosis is present.   5. The inferior vena cava is normal in size with greater than 50%  respiratory variability, suggesting right atrial pressure of 3 mmHg.     Neuro/Psych  PSYCHIATRIC DISORDERS Anxiety     negative neurological ROS     GI/Hepatic negative GI ROS,,,(+)     substance abuse  alcohol use  Endo/Other  diabetes, Type 1  Insulin pump and dexcom both off  Renal/GU negative Renal ROS  negative genitourinary   Musculoskeletal negative musculoskeletal ROS (+)    Abdominal   Peds  Hematology negative hematology ROS (+)   Anesthesia Other Findings Pancreatic stricture, chronic pancreatitis  Reproductive/Obstetrics                             Anesthesia Physical Anesthesia Plan  ASA: 3  Anesthesia Plan: General    Post-op Pain Management: Minimal or no pain anticipated   Induction: Intravenous  PONV Risk Score and Plan: 1 and Midazolam and Ondansetron  Airway Management Planned: Oral ETT  Additional Equipment:   Intra-op Plan:   Post-operative Plan: Extubation in OR  Informed Consent: I have reviewed the patients History and Physical, chart, labs and discussed the procedure including the risks, benefits and alternatives for the proposed anesthesia with the patient or authorized representative who has indicated his/her understanding and acceptance.     Dental advisory given  Plan Discussed with: CRNA  Anesthesia Plan Comments:        Anesthesia Quick Evaluation

## 2023-06-02 NOTE — H&P (Addendum)
GASTROENTEROLOGY PROCEDURE H&P NOTE   Primary Care Physician: [name], MD  HPI: Carlos [name] is a 37 y.o. male who presents for ERCP for PD stent evaluation and CBD stent evaluation in setting of chronic pancreatitis.  Past Medical History:  Diagnosis Date   AKI (acute kidney injury) (HCC) 12/21/2021   Anxiety    Diabetes (HCC)    Pancreatitis    Splenic vein thrombosis    Past Surgical History:  Procedure Laterality Date   BILIARY BRUSHING  02/02/2023   Procedure: BILIARY BRUSHING;  Surgeon: [name] [name], MD;  Location: [facility];  Service: Gastroenterology;;   BILIARY BRUSHING  04/28/2023   Procedure: BILIARY BRUSHING;  Surgeon: Meryl Dare, MD;  Location: [facility] ENDOSCOPY;  Service: Gastroenterology;;   BILIARY DILATION  12/01/2022   Procedure: BILIARY DILATION;  Surgeon: Lemar Lofty., MD;  Location: [facility];  Service: Gastroenterology;;   BILIARY DILATION  02/02/2023   Procedure: BILIARY DILATION;  Surgeon: Lemar Lofty., MD;  Location: [facility];  Service: Gastroenterology;;   BILIARY STENT PLACEMENT  04/28/2023   Procedure: BILIARY STENT PLACEMENT;  Surgeon: Meryl Dare, MD;  Location: [facility] ENDOSCOPY;  Service: Gastroenterology;;   BIOPSY  12/02/2021   Procedure: BIOPSY;  Surgeon: Lemar Lofty., MD;  Location: [facility] ENDOSCOPY;  Service: Gastroenterology;;   ENDOSCOPIC RETROGRADE CHOLANGIOPANCREATOGRAPHY (ERCP) WITH PROPOFOL N/A 08/12/2022   Procedure: ENDOSCOPIC RETROGRADE CHOLANGIOPANCREATOGRAPHY (ERCP) WITH PROPOFOL;  Surgeon: Lemar Lofty., MD;  Location: [facility] ENDOSCOPY;  Service: Gastroenterology;  Laterality: N/A;   ENDOSCOPIC RETROGRADE CHOLANGIOPANCREATOGRAPHY (ERCP) WITH PROPOFOL N/A 12/01/2022   Procedure: ENDOSCOPIC RETROGRADE CHOLANGIOPANCREATOGRAPHY (ERCP) WITH PROPOFOL;  Surgeon: [name] [name], MD;  Location: [facility] ENDOSCOPY;  Service: Gastroenterology;  Laterality: N/A;   ENDOSCOPIC  RETROGRADE CHOLANGIOPANCREATOGRAPHY (ERCP) WITH PROPOFOL N/A 02/02/2023   Procedure: ENDOSCOPIC RETROGRADE CHOLANGIOPANCREATOGRAPHY (ERCP) WITH PROPOFOL;  Surgeon: [name] [name], MD;  Location: [facility] ENDOSCOPY;  Service: Gastroenterology;  Laterality: N/A;   ERCP N/A 04/28/2023   Procedure: ENDOSCOPIC RETROGRADE CHOLANGIOPANCREATOGRAPHY (ERCP);  Surgeon: Meryl Dare, MD;  Location: [facility] ENDOSCOPY;  Service: Gastroenterology;  Laterality: N/A;   ESOPHAGOGASTRODUODENOSCOPY N/A 12/01/2022   Procedure: ESOPHAGOGASTRODUODENOSCOPY (EGD);  Surgeon: Lemar Lofty., MD;  Location: [facility];  Service: Gastroenterology;  Laterality: N/A;   ESOPHAGOGASTRODUODENOSCOPY (EGD) WITH PROPOFOL N/A 12/02/2021   Procedure: ESOPHAGOGASTRODUODENOSCOPY (EGD) WITH PROPOFOL;  Surgeon: [name] [name], MD;  Location: [facility] ENDOSCOPY;  Service: Gastroenterology;  Laterality: N/A;   FINE NEEDLE ASPIRATION N/A 12/01/2022   Procedure: FINE NEEDLE ASPIRATION (FNA) LINEAR;  Surgeon: Lemar Lofty., MD;  Location: [facility] ENDOSCOPY;  Service: Gastroenterology;  Laterality: N/A;   NEUROLYTIC CELIAC PLEXUS  12/01/2022   Procedure: CELIAC PLEXUS BLOCK;  Surgeon: [name] [name], MD;  Location: [facility];  Service: Gastroenterology;;   NO PAST SURGERIES     PANCREATIC STENT PLACEMENT  08/12/2022   Procedure: PANCREATIC STENT PLACEMENT;  Surgeon: Lemar Lofty., MD;  Location: [facility];  Service: Gastroenterology;;   PANCREATIC STENT PLACEMENT  12/01/2022   Procedure: PANCREATIC STENT PLACEMENT;  Surgeon: Lemar Lofty., MD;  Location: [facility];  Service: Gastroenterology;;   PANCREATIC STENT PLACEMENT  02/02/2023   Procedure: PANCREATIC STENT PLACEMENT;  Surgeon: Lemar Lofty., MD;  Location: [facility];  Service: Gastroenterology;;   REMOVAL OF STONES  12/01/2022   Procedure: REMOVAL OF STONES;  Surgeon: Lemar Lofty., MD;  Location: [facility];   Service: Gastroenterology;;   REMOVAL OF STONES  02/02/2023   Procedure: REMOVAL OF STONES;  Surgeon: Lemar Lofty., MD;  Location: [facility];  Service: Gastroenterology;;   [name]  08/12/2022   Procedure: [name];  Surgeon: [name] [name], MD;  Location: [facility];  Service: Gastroenterology;;   [name]  12/01/2022   Procedure: [name];  Surgeon: [name] [name], MD;  Location: [facility];  Service: Gastroenterology;;   Francine Graven REMOVAL  12/01/2022   Procedure: STENT REMOVAL;  Surgeon: Lemar Lofty., MD;  Location: [facility];  Service: Gastroenterology;;   Francine Graven REMOVAL  02/02/2023   Procedure: STENT REMOVAL;  Surgeon: Lemar Lofty., MD;  Location: [facility];  Service: Gastroenterology;;   UPPER ESOPHAGEAL ENDOSCOPIC ULTRASOUND (EUS) N/A 12/02/2021   Procedure: UPPER ESOPHAGEAL ENDOSCOPIC ULTRASOUND (EUS);  Surgeon: Lemar Lofty., MD;  Location: [facility];  Service: Gastroenterology;  Laterality: N/A;   UPPER ESOPHAGEAL ENDOSCOPIC ULTRASOUND (EUS) N/A 12/01/2022   Procedure: UPPER ESOPHAGEAL ENDOSCOPIC ULTRASOUND (EUS);  Surgeon: Lemar Lofty., MD;  Location: [facility];  Service: Gastroenterology;  Laterality: N/A;   No current facility-administered medications for this encounter.   No current facility-administered medications for this encounter. Allergies  Allergen Reactions   Other Anaphylaxis and Other (See Comments)    [name] - anaphylaxis   Family History  Problem Relation Age of Onset   Hypertension Mother    Alcoholism Maternal Uncle    Diabetes Maternal Grandmother    Diabetes Maternal Grandfather    Diabetes Paternal Grandmother    Diabetes Paternal Grandfather    Colon cancer Neg Hx    Esophageal cancer Neg Hx    Inflammatory bowel disease Neg Hx    Liver disease Neg Hx    Pancreatic cancer Neg Hx    Rectal cancer Neg Hx    Stomach cancer Neg Hx    Social  History   Socioeconomic History   Marital status: Single    Spouse name: Not on file   Number of children: 6   Years of education: Not on file   Highest education level: Associate degree: academic program  Occupational History   Occupation: Museum/gallery exhibitions officer  Tobacco Use   Smoking status: Some Days    Types: Cigars    Start date: 11/08/2002   Smokeless tobacco: Never   Tobacco comments:    Once a week.  Vaping Use   Vaping status: Never Used  Substance and Sexual Activity   Alcohol use: Not Currently   Drug use: Never   Sexual activity: Not on file  Other Topics Concern   Not on file  Social History Narrative   Not on file   Social Determinants of Health   Financial Resource Strain: Low Risk  (04/05/2023)   Overall Financial Resource Strain (CARDIA)    Difficulty of Paying Living Expenses: Not hard at all  Food Insecurity: No Food Insecurity (04/23/2023)   Hunger Vital Sign    Worried About Running Out of Food in the Last Year: Never true    Ran Out of Food in the Last Year: Never true  Transportation Needs: No Transportation Needs (04/23/2023)   PRAPARE - Administrator, Civil Service (Medical): No    Lack of Transportation (Non-Medical): No  Physical Activity: Sufficiently Active (04/05/2023)   Exercise Vital Sign    Days of Exercise per Week: 4 days    Minutes of Exercise per Session: 150+ min  Stress: Stress Concern Present (04/05/2023)   [employer] of Occupational Health - Occupational Stress Questionnaire    Feeling of Stress : To some extent  Social Connections: Moderately Isolated (04/05/2023)   Social Connection and Isolation Panel [NHANES]    Frequency of Communication with Friends and Family: More than three times a week    Frequency of Social Gatherings with Friends and Family: Three times a week    Attends Religious Services: Never    Active Member of Clubs or Organizations: No    Attends Banker Meetings: Not on file    Marital  Status: Married  Intimate Partner Violence: Not At Risk (04/23/2023)   Humiliation, Afraid, Rape, and Kick questionnaire    Fear of Current or Ex-Partner: No    Emotionally Abused: No    Physically Abused: No    Sexually Abused: No    Physical Exam: Today's Vitals   05/31/23 1246  Weight: 83.9 kg   Body mass index is 21.94 kg/m. GEN: NAD EYE: Sclerae anicteric ENT: MMM CV: Non-tachycardic GI: Soft, TTP in MEG 7-8/10 pain currently preprocedure NEURO:  Alert & Oriented x 3  Lab Results: No results for input(s): "WBC", "HGB", "HCT", "PLT" in the last 72 hours. BMET No results for input(s): "NA", "K", "CL", "CO2", "GLUCOSE", "BUN", "CREATININE", "CALCIUM" in the last 72 hours. LFT No results for input(s): "PROT", "ALBUMIN", "AST", "ALT", "ALKPHOS", "BILITOT", "BILIDIR", "IBILI" in the last 72 hours. PT/INR No results for input(s): "LABPROT", "INR" in the last 72 hours.   Impression / Plan: This is a 37 y.o.male who presents for ERCP for PD stent evaluation and CBD stent evaluation in setting of chronic pancreatitis.  The risks of an ERCP were discussed at length, including but not limited to the risk of perforation, bleeding, abdominal pain, post-ERCP pancreatitis (while usually mild can be severe and even life threatening).  The risks and benefits of endoscopic evaluation/treatment were discussed with the patient and/or family; these include but are not limited to the risk of perforation, infection, bleeding, missed lesions, lack of diagnosis, severe illness requiring hospitalization, as well as anesthesia and sedation related illnesses.  The patient's history has been reviewed, patient examined, no change in status, and deemed stable for procedure.  The patient and/or family is agreeable to proceed.    [name], MD  [facility] Office # [phone]

## 2023-06-02 NOTE — Transfer of Care (Signed)
Immediate Anesthesia Transfer of Care Note  Patient: Carlos [name]  Procedure(s) Performed: ENDOSCOPIC RETROGRADE CHOLANGIOPANCREATOGRAPHY (ERCP) WITH PROPOFOL BILIARY BRUSHING REMOVAL OF STONES STENT REMOVAL BILIARY STENT PLACEMENT PANCREATIC STENT PLACEMENT  Patient Location: PACU and Endoscopy Unit  Anesthesia Type:General  Level of Consciousness: awake, alert , and oriented  Airway & Oxygen Therapy: Patient Spontanous Breathing and Patient connected to face mask oxygen  Post-op Assessment: Report given to RN and Post -op Vital signs reviewed and stable  Post vital signs: Reviewed and stable  Last Vitals:  Vitals Value Taken Time  BP 143/85 06/02/23 1040  Temp 35.9 C 06/02/23 1040  Pulse 102 06/02/23 1042  Resp 12 06/02/23 1042  SpO2 100 % 06/02/23 1042  Vitals shown include unfiled device data.  Last Pain:  Vitals:   06/02/23 1040  TempSrc: Tympanic  PainSc: 8       Patients Stated Pain Goal: 5 (06/02/23 0837)  Complications: No notable events documented.

## 2023-06-02 NOTE — Anesthesia Postprocedure Evaluation (Signed)
Anesthesia Post Note  Patient: Carlos [name]  Procedure(s) Performed: ENDOSCOPIC RETROGRADE CHOLANGIOPANCREATOGRAPHY (ERCP) WITH PROPOFOL BILIARY BRUSHING REMOVAL OF STONES STENT REMOVAL BILIARY STENT PLACEMENT PANCREATIC STENT PLACEMENT SPHINCTEROTOMY     Patient location during evaluation: Endoscopy Anesthesia Type: General Level of consciousness: awake and alert Pain management: pain level controlled Vital Signs Assessment: post-procedure vital signs reviewed and stable Respiratory status: spontaneous breathing, nonlabored ventilation, respiratory function stable and patient connected to nasal cannula oxygen Cardiovascular status: blood pressure returned to baseline and stable Postop Assessment: no apparent nausea or vomiting Anesthetic complications: no  No notable events documented.  Last Vitals:  Vitals:   06/02/23 1140 06/02/23 1150  BP: 131/86 134/88  Pulse: 81 78  Resp: 14 12  Temp:    SpO2: 98% 98%    Last Pain:  Vitals:   06/02/23 1150  TempSrc:   PainSc: 7                  Aarilyn Dye L Fe Okubo

## 2023-06-02 NOTE — Telephone Encounter (Signed)
-----   Message from [facility] sent at 06/02/2023  2:51 PM EDT ----- Regarding: Follow-up Carlos [name], This patient needs CT abdomen pancreas protocol in 4 weeks. Overbook him if needed to see me after the CT scan has been completed. KUB in 2 weeks for pancreas stent follow-up. ERCP recall for 3 to 4 months. LFTs in 2 weeks when he comes for KUB. Thanks. GM

## 2023-06-02 NOTE — Telephone Encounter (Signed)
See alternate note dated 7/25

## 2023-06-02 NOTE — Telephone Encounter (Signed)
Patient called wanting to let you know the pain mang. doctor's name is Dr. Ellwood Sayers in [facility].

## 2023-06-02 NOTE — Telephone Encounter (Signed)
Dr [name] please advise if prescription can be changed to a lower dose.

## 2023-06-02 NOTE — Telephone Encounter (Signed)
KUB and LAB order has been entered  CT order entered and sent to the schedulers  ERCP recall entered  The pt has been advised. I will call him in 2 weeks for KUB and Labs

## 2023-06-02 NOTE — Telephone Encounter (Signed)
The pt has been advised and states he will look for the name of the pain MD and call back.

## 2023-06-02 NOTE — Telephone Encounter (Signed)
Inbound call from patient's partner stating oxycodone prescription that was prescribed after procedure today 7/25 needs prior authorization or the dosage needs to be lowered in order pharmacy to fill medication. Please advise, thank you.

## 2023-06-02 NOTE — Anesthesia Procedure Notes (Signed)
Procedure Name: Intubation Date/Time: 06/02/2023 9:12 AM  Performed by: Maurene Capes, CRNAPre-anesthesia Checklist: Patient identified, Emergency Drugs available, Suction available and Patient being monitored Patient Re-evaluated:Patient Re-evaluated prior to induction Oxygen Delivery Method: Circle System Utilized Preoxygenation: Pre-oxygenation with 100% oxygen Induction Type: IV induction Ventilation: Mask ventilation without difficulty Laryngoscope Size: Mac and 4 Grade View: Grade II Tube type: Oral Tube size: 7.5 mm Number of attempts: 1 Airway Equipment and Method: Stylet Placement Confirmation: ETT inserted through vocal cords under direct vision, positive ETCO2 and breath sounds checked- equal and bilateral Secured at: 23 cm Tube secured with: Tape Dental Injury: Teeth and Oropharynx as per pre-operative assessment

## 2023-06-06 ENCOUNTER — Encounter: Payer: Self-pay | Admitting: Gastroenterology

## 2023-06-06 ENCOUNTER — Encounter (HOSPITAL_COMMUNITY): Payer: Self-pay | Admitting: Gastroenterology

## 2023-06-08 ENCOUNTER — Other Ambulatory Visit: Payer: Self-pay | Admitting: Gastroenterology

## 2023-06-08 ENCOUNTER — Other Ambulatory Visit: Payer: Self-pay

## 2023-06-08 ENCOUNTER — Telehealth: Payer: Self-pay | Admitting: Gastroenterology

## 2023-06-08 DIAGNOSIS — G8929 Other chronic pain: Secondary | ICD-10-CM

## 2023-06-08 DIAGNOSIS — K861 Other chronic pancreatitis: Secondary | ICD-10-CM

## 2023-06-08 MED ORDER — HYDROMORPHONE HCL 4 MG PO TABS
4.0000 mg | ORAL_TABLET | ORAL | 0 refills | Status: AC | PRN
Start: 2023-06-08 — End: 2023-06-14

## 2023-06-08 NOTE — Progress Notes (Signed)
Patient still having some pain post-ERCP, which could be his chronic pancreatitis or pain from his most recent ERCP. Will give 6-days of his normal Dilaudid to get him through until his followup Dilaudid will be done by his Pain specialist. Total 4 mg Dilaudid (36/0). No additional opioid therapy will be sent by my office unless it is around times of procedures if indicated/necessary.  [name], MD [facility] Office # [phone]

## 2023-06-08 NOTE — Telephone Encounter (Signed)
I had an extensive discussion today with the pharmacy and pharmacist this afternoon. I have reviewed the patient's PDMP as well His #180 Dilaudid 4 mg comes from his pain specialist at this point in time not me or his primary care provider. Last weeks Percocet was given in view of his normal Dilaudid since this was postprocedure discomfort that was possible and that has been reviewed. I still have concern that we may have some post procedural pain from his recent ERCP, but thankfully we have been able to keep him out of the hospital so I am comfortable with the 6-day Dilaudid prescription being given and him continuing to receive his normal pain medication from his pain specialist. Dr. Mr. Locke is aware that I am not going to continue to give any further prescriptions except around the week after a procedure if we have concern of postprocedural pain that may be a result of what we have done and certainly my high risk pancreatic ERCP does carry risks of pancreatitis. I think that the pharmacy will be able to relay and get the prescription filled and they will reach out to the patient. Thank you for taking this call and the call back.

## 2023-06-08 NOTE — Telephone Encounter (Signed)
Inbound call from patient's pharmacy stating patient brought in a order for Hydromorphone medication but pharmacy was unable to fill due to complications. Pharmacy requesting a call back to discuss further before refilled prescription. Call back number is [phone] and ask for [facility]. Please advise, thank you.

## 2023-06-08 NOTE — Telephone Encounter (Signed)
Inbound call from patient stating he needs a prior authorization for   H[place].  Requeting a call to discuss.Please advise.

## 2023-06-08 NOTE — Telephone Encounter (Signed)
Dr. [name], I returned call to [facility] (pharmacists). She informed me that when filling prescription for Dilaudid she noticed several red flags. Marchelle Folks informed me that #180 Dilaudid 4 mg tablet was dispenses on 05/16/23 (30 day supply). Pt would not be able to fill until next week. Marchelle Folks also mentioned that when she went into the database she noticed that patient has been getting this prescription from multiple providers and different pharmacies. Marchelle Folks will hold this prescription at this time. Marchelle Folks informed me that you should be able to see in the database as well. Thanks

## 2023-06-13 ENCOUNTER — Other Ambulatory Visit (HOSPITAL_BASED_OUTPATIENT_CLINIC_OR_DEPARTMENT_OTHER): Payer: Self-pay

## 2023-06-13 MED ORDER — HYDROMORPHONE HCL 4 MG PO TABS
4.0000 mg | ORAL_TABLET | ORAL | 0 refills | Status: DC | PRN
  Filled 2023-06-13: qty 180, 30d supply, fill #0

## 2023-06-16 ENCOUNTER — Telehealth: Payer: Self-pay

## 2023-06-16 NOTE — Telephone Encounter (Signed)
-----   Message from Nurse  P sent at 06/02/2023  3:35 PM EDT ----- Pt needs KUB and labs

## 2023-06-16 NOTE — Telephone Encounter (Signed)
The pt has been advised to come in for labs and Xray

## 2023-06-22 ENCOUNTER — Other Ambulatory Visit: Payer: Self-pay | Admitting: Gastroenterology

## 2023-07-06 ENCOUNTER — Encounter: Payer: Self-pay | Admitting: Family Medicine

## 2023-07-06 ENCOUNTER — Ambulatory Visit (INDEPENDENT_AMBULATORY_CARE_PROVIDER_SITE_OTHER): Payer: Medicaid Other | Admitting: Family Medicine

## 2023-07-06 VITALS — BP 120/82 | HR 104 | Temp 97.5°F | Resp 16 | Ht 77.0 in | Wt 155.6 lb

## 2023-07-06 DIAGNOSIS — R1013 Epigastric pain: Secondary | ICD-10-CM

## 2023-07-06 DIAGNOSIS — Z7901 Long term (current) use of anticoagulants: Secondary | ICD-10-CM

## 2023-07-06 DIAGNOSIS — G8929 Other chronic pain: Secondary | ICD-10-CM

## 2023-07-06 DIAGNOSIS — E1065 Type 1 diabetes mellitus with hyperglycemia: Secondary | ICD-10-CM | POA: Diagnosis not present

## 2023-07-06 DIAGNOSIS — F1029 Alcohol dependence with unspecified alcohol-induced disorder: Secondary | ICD-10-CM

## 2023-07-06 LAB — HEMOGLOBIN A1C
Hgb A1c MFr Bld: 8.3 %{Hb} — ABNORMAL HIGH (ref <5.7)
Mean Plasma Glucose: 192 mg/dL
eAG (mmol/L): 10.6 mmol/L

## 2023-07-06 NOTE — Progress Notes (Signed)
Provider:  Jacalyn Lefevre, MD  Careteam: Patient Care Team: Carlos [name], Carlos Citrin, NP as PCP - General (Family Medicine) Flo Shanks, MD as Consulting Physician (Otolaryngology)  PLACE OF SERVICE:  [facility]  Advanced Directive information Does Patient Have a Medical Advance Directive?: No, Would patient like information on creating a medical advance directive?: No - Patient declined  Allergies  Allergen Reactions   Other Anaphylaxis and Other (See Comments)    Carlos [name] - anaphylaxis    Chief Complaint  Patient presents with   Medical Management of Chronic Issues    3 month follow up.   Immunizations    Discuss the need for Influenza vaccine.      HPI: Patient is a 37 y.o. male .  He is here to follow-up chronic pancreatitis and pain as well as diabetes.  Diabetes seems to be doing well.  He has all the latest pumps and monitors.  He was going through a transitional time with diabetes care at last visit and A1c had gone up to 8.6 but generally it has been in the sevens.  He continues to have pancreatic pain.  Followed by gastroenterology.  With a pancreatitis I have been providing prescriptions for Dilaudid and this has helped manage his pain.  He continues to work daily and seems to really be doing well overall.  Denies any use of alcohol any longer  Review of Systems:  Review of Systems  Constitutional: Negative.   Respiratory: Negative.    Cardiovascular: Negative.   Gastrointestinal:  Positive for abdominal pain.  Neurological: Negative.     Past Medical History:  Diagnosis Date   AKI (acute kidney injury) (HCC) 12/21/2021   Anxiety    Diabetes (HCC)    Pancreatitis    Splenic vein thrombosis    Past Surgical History:  Procedure Laterality Date   BILIARY BRUSHING  02/02/2023   Procedure: BILIARY BRUSHING;  Surgeon: [name] [name], MD;  Location: [facility];  Service: Gastroenterology;;   BILIARY BRUSHING  04/28/2023   Procedure: BILIARY  BRUSHING;  Surgeon: Meryl Dare, MD;  Location: [facility] ENDOSCOPY;  Service: Gastroenterology;;   BILIARY BRUSHING  06/02/2023   Procedure: BILIARY BRUSHING;  Surgeon: Lemar Lofty., MD;  Location: [facility];  Service: Gastroenterology;;   BILIARY DILATION  12/01/2022   Procedure: BILIARY DILATION;  Surgeon: Lemar Lofty., MD;  Location: [facility];  Service: Gastroenterology;;   BILIARY DILATION  02/02/2023   Procedure: BILIARY DILATION;  Surgeon: Lemar Lofty., MD;  Location: [facility];  Service: Gastroenterology;;   BILIARY STENT PLACEMENT  04/28/2023   Procedure: BILIARY STENT PLACEMENT;  Surgeon: Meryl Dare, MD;  Location: [facility] ENDOSCOPY;  Service: Gastroenterology;;   BILIARY STENT PLACEMENT N/A 06/02/2023   Procedure: BILIARY STENT PLACEMENT;  Surgeon: Lemar Lofty., MD;  Location: [facility] ENDOSCOPY;  Service: Gastroenterology;  Laterality: N/A;   BIOPSY  12/02/2021   Procedure: BIOPSY;  Surgeon: [name] [name], MD;  Location: [facility] ENDOSCOPY;  Service: Gastroenterology;;   ENDOSCOPIC RETROGRADE CHOLANGIOPANCREATOGRAPHY (ERCP) WITH PROPOFOL N/A 08/12/2022   Procedure: ENDOSCOPIC RETROGRADE CHOLANGIOPANCREATOGRAPHY (ERCP) WITH PROPOFOL;  Surgeon: Lemar Lofty., MD;  Location: [facility] ENDOSCOPY;  Service: Gastroenterology;  Laterality: N/A;   ENDOSCOPIC RETROGRADE CHOLANGIOPANCREATOGRAPHY (ERCP) WITH PROPOFOL N/A 12/01/2022   Procedure: ENDOSCOPIC RETROGRADE CHOLANGIOPANCREATOGRAPHY (ERCP) WITH PROPOFOL;  Surgeon: [name] [name], MD;  Location: [facility] ENDOSCOPY;  Service: Gastroenterology;  Laterality: N/A;   ENDOSCOPIC RETROGRADE CHOLANGIOPANCREATOGRAPHY (ERCP) WITH PROPOFOL N/A 02/02/2023   Procedure: ENDOSCOPIC RETROGRADE CHOLANGIOPANCREATOGRAPHY (  ERCP) WITH PROPOFOL;  Surgeon: [name], [name], MD;  Location: [facility];  Service: Gastroenterology;  Laterality: N/A;   ENDOSCOPIC RETROGRADE CHOLANGIOPANCREATOGRAPHY (ERCP) WITH  PROPOFOL N/A 06/02/2023   Procedure: ENDOSCOPIC RETROGRADE CHOLANGIOPANCREATOGRAPHY (ERCP) WITH PROPOFOL;  Surgeon: [name] [name], MD;  Location: [facility] ENDOSCOPY;  Service: Gastroenterology;  Laterality: N/A;   ERCP N/A 04/28/2023   Procedure: ENDOSCOPIC RETROGRADE CHOLANGIOPANCREATOGRAPHY (ERCP);  Surgeon: Meryl Dare, MD;  Location: [facility] ENDOSCOPY;  Service: Gastroenterology;  Laterality: N/A;   ESOPHAGOGASTRODUODENOSCOPY N/A 12/01/2022   Procedure: ESOPHAGOGASTRODUODENOSCOPY (EGD);  Surgeon: Lemar Lofty., MD;  Location: [facility];  Service: Gastroenterology;  Laterality: N/A;   ESOPHAGOGASTRODUODENOSCOPY (EGD) WITH PROPOFOL N/A 12/02/2021   Procedure: ESOPHAGOGASTRODUODENOSCOPY (EGD) WITH PROPOFOL;  Surgeon: [name] [name], MD;  Location: [facility];  Service: Gastroenterology;  Laterality: N/A;   FINE NEEDLE ASPIRATION N/A 12/01/2022   Procedure: FINE NEEDLE ASPIRATION (FNA) LINEAR;  Surgeon: Lemar Lofty., MD;  Location: [facility] ENDOSCOPY;  Service: Gastroenterology;  Laterality: N/A;   NEUROLYTIC CELIAC PLEXUS  12/01/2022   Procedure: CELIAC PLEXUS BLOCK;  Surgeon: [name], [name], MD;  Location: [facility];  Service: Gastroenterology;;   NO PAST SURGERIES     PANCREATIC STENT PLACEMENT  08/12/2022   Procedure: PANCREATIC STENT PLACEMENT;  Surgeon: Lemar Lofty., MD;  Location: [facility];  Service: Gastroenterology;;   PANCREATIC STENT PLACEMENT  12/01/2022   Procedure: PANCREATIC STENT PLACEMENT;  Surgeon: Lemar Lofty., MD;  Location: [facility];  Service: Gastroenterology;;   PANCREATIC STENT PLACEMENT  02/02/2023   Procedure: PANCREATIC STENT PLACEMENT;  Surgeon: Lemar Lofty., MD;  Location: [facility];  Service: Gastroenterology;;   PANCREATIC STENT PLACEMENT  06/02/2023   Procedure: PANCREATIC STENT PLACEMENT;  Surgeon: Lemar Lofty., MD;  Location: [facility];  Service: Gastroenterology;;    REMOVAL OF STONES  12/01/2022   Procedure: REMOVAL OF STONES;  Surgeon: Lemar Lofty., MD;  Location: [facility];  Service: Gastroenterology;;   REMOVAL OF STONES  02/02/2023   Procedure: REMOVAL OF STONES;  Surgeon: Lemar Lofty., MD;  Location: [facility];  Service: Gastroenterology;;   REMOVAL OF STONES  06/02/2023   Procedure: REMOVAL OF STONES;  Surgeon: Lemar Lofty., MD;  Location: [facility];  Service: Gastroenterology;;   [name]  08/12/2022   Procedure: [name];  Surgeon: Lemar Lofty., MD;  Location: [facility];  Service: Gastroenterology;;   [name]  12/01/2022   Procedure: [name];  Surgeon: Lemar Lofty., MD;  Location: [facility];  Service: Gastroenterology;;   [name]  06/02/2023   Procedure: [name];  Surgeon: Lemar Lofty., MD;  Location: [facility];  Service: Gastroenterology;;   Francine Graven REMOVAL  12/01/2022   Procedure: STENT REMOVAL;  Surgeon: Lemar Lofty., MD;  Location: [facility];  Service: Gastroenterology;;   Francine Graven REMOVAL  02/02/2023   Procedure: STENT REMOVAL;  Surgeon: Lemar Lofty., MD;  Location: [facility];  Service: Gastroenterology;;   Francine Graven REMOVAL  06/02/2023   Procedure: STENT REMOVAL;  Surgeon: Lemar Lofty., MD;  Location: [facility] END[facility];  Service: Gastroenterology;;   UPPER ESOPHAGEAL ENDOSCOPIC ULTRASOUND (EUS) N/A 12/02/2021   Procedure: UPPER ESOPHAGEAL ENDOSCOPIC ULTRASOUND (EUS);  Surgeon: Lemar Lofty., MD;  Location: [facility];  Service: Gastroenterology;  Laterality: N/A;   UPPER ESOPHAGEAL ENDOSCOPIC ULTRASOUND (EUS) N/A 12/01/2022   Procedure: UPPER ESOPHAGEAL ENDOSCOPIC ULTRASOUND (EUS);  Surgeon: Lemar Lofty., MD;  Location: [facility];  Service: Gastroenterology;  Laterality: N/A;   Social History:   reports that he  has been smoking cigars. He started smoking about 20 years ago. He  has never used smokeless tobacco. He reports that he does not currently use alcohol. He reports that he does not use drugs.  Family History  Problem Relation Age of Onset   Hypertension Mother    Alcoholism Maternal Uncle    Diabetes Maternal Grandmother    Diabetes Maternal Grandfather    Diabetes Paternal Grandmother    Diabetes Paternal Grandfather    Colon cancer Neg Hx    Esophageal cancer Neg Hx    Inflammatory bowel disease Neg Hx    Liver disease Neg Hx    Pancreatic cancer Neg Hx    Rectal cancer Neg Hx    Stomach cancer Neg Hx     Medications: Patient's Medications  New Prescriptions   No medications on file  Previous Medications   CONTINUOUS BLOOD GLUC SENSOR (DEXCOM G6 SENSOR) MISC    1 Device by Other route as directed. Change sensors every 10 days   CONTINUOUS BLOOD GLUC TRANSMIT (DEXCOM G6 TRANSMITTER) MISC    1 Device by Does not apply route as directed.   HYDROMORPHONE (DILAUDID) 4 MG TABLET    Take 1 tablet (4 mg total) by mouth every 4 (four) hours as needed for pain, max daily dose: 6 Tablet.   INSULIN ASPART (NOVOLOG) 100 UNIT/ML INJECTION    60 units a day   INSULIN DISPOSABLE PUMP (OMNIPOD 5 G6 POD, GEN 5,) MISC    1 Device by Does not apply route every 3 (three) days.   INSULIN GLARGINE (LANTUS SOLOSTAR) 100 UNIT/ML SOLOSTAR PEN    Inject 16 Units into the skin See admin instructions. Inject 16 units into the skin once a day as directed when Omnipod is not attached   INSULIN PEN NEEDLE 32G X 4 MM MISC    1 Device by Does not apply route daily in the afternoon.   LIPASE/PROTEASE/AMYLASE (CREON) 36000 UNITS CPEP CAPSULE    Take 1-2 capsules (36,000-72,000 Units total) by mouth See admin instructions. Take 72,000 units by mouth three times a day before meals and 36,000 units with any snacks   MULTIVITAMIN (ONE-A-DAY MEN'S) TABS TABLET    Take 1 tablet by mouth daily with breakfast.   NALOXONE (NARCAN) NASAL SPRAY 4 MG/0.1 ML    Place 1 spray into the nose once.    OMEPRAZOLE (PRILOSEC) 40 MG CAPSULE    TAKE 1 CAPSULE (40 MG TOTAL) BY MOUTH DAILY.   ONDANSETRON (ZOFRAN) 8 MG TABLET    Take 1 tablet (8 mg total) by mouth every 8 (eight) hours as needed for nausea or vomiting.  Modified Medications   No medications on file  Discontinued Medications   No medications on file    Physical Exam:  Vitals:   07/06/23 0854  BP: 120/82  Pulse: (!) 104  Resp: 16  Temp: (!) 97.5 F (36.4 C)  SpO2: 99%  Weight: 155 lb 9.6 oz (70.6 kg)  Height: 6\' 5"  (1.956 m)   Body mass index is 18.45 kg/m. Wt Readings from Last 3 Encounters:  07/06/23 155 lb 9.6 oz (70.6 kg)  06/02/23 180 lb (81.6 kg)  04/30/23 157 lb 10.1 oz (71.5 kg)    Physical Exam Vitals and nursing note reviewed.  Constitutional:      Appearance: Normal appearance.  Cardiovascular:     Rate and Rhythm: Normal rate.  Pulmonary:     Effort: Pulmonary effort is normal.     Breath sounds: Normal  breath sounds.  Abdominal:     General: Bowel sounds are normal.     Palpations: Abdomen is soft.     Tenderness: There is abdominal tenderness.  Neurological:     General: No focal deficit present.     Mental Status: He is alert and oriented to person, place, and time.     Labs reviewed: Basic Metabolic Panel: Recent Labs    07/22/22 0541 08/02/22 0914 04/25/23 0459 04/25/23 0500 04/26/23 0446 04/27/23 0254 04/28/23 0315 04/29/23 0606 04/30/23 0534 06/02/23 1032  NA 138   < >  --    < > 132*   < > 133* 132* 134* 135  K 4.4   < >  --    < > 3.4*   < > 3.3* 3.8 3.7 3.9  CL 99   < >  --    < > 99   < > 100 95* 97* 99  CO2 30   < >  --    < > 21*   < > 24 27 27 25   GLUCOSE 148*   < >  --    < > 186*   < > 114* 149* 206* 339*  BUN <5*   < >  --    < > <5*   < > <5* <5* <5* 9  CREATININE 0.93   < >  --    < > 0.86   < > 0.78 0.88 0.94 0.97  CALCIUM 9.9   < >  --    < > 8.6*   < > 8.5* 9.0 9.1 8.6*  MG 1.9   < > 1.7  --  1.7   < > 1.6* 1.7 1.8  --   PHOS 4.0  --  3.3  --  3.4   --   --   --   --   --    < > = values in this interval not displayed.   Liver Function Tests: Recent Labs    04/29/23 0606 04/30/23 0534 06/02/23 1032  AST 50* 40 66*  ALT 137* 113* 22  ALKPHOS 861* 757* 162*  BILITOT 5.8* 5.1* 1.2  PROT 6.4* 6.7 7.0  ALBUMIN 2.5* 2.7* 3.5   Recent Labs    04/22/23 1509 04/29/23 0915 06/02/23 1032  LIPASE 19 21 45  AMYLASE  --   --  52   No results for input(s): "AMMONIA" in the last 8760 hours. CBC: Recent Labs    11/10/22 2303 11/12/22 0443 02/03/23 1229 02/03/23 1646 04/22/23 1509 04/23/23 0303 04/29/23 0606 04/30/23 0534 06/02/23 0853  WBC 5.7   < > 4.8   < > 5.1   < > 5.9 4.8 3.9*  NEUTROABS 3.6  --  3.0  --  3.6  --   --   --   --   HGB 14.8   < > 14.0   < > 14.0   < > 11.7* 11.5* 14.7  HCT 44.3   < > 41.5   < > 42.3   < > 33.2* 33.6* 42.7  MCV 104.2*   < > 104.5*   < > 105.0*   < > 100.0 99.7 99.3  PLT 222   < > 156   < > 179   < > 323 348 225   < > = values in this interval not displayed.   Lipid Panel: Recent Labs    07/20/22 0215  TRIG 153*   TSH: No results for input(s): "TSH"  in the last 8760 hours. A1C: Lab Results  Component Value Date   HGBA1C 8.6 (H) 04/06/2023     Assessment/Plan  1. Type 1 diabetes mellitus with hyperglycemia (HCC) Insulin is managed with pump and monitor and A1c's have been pretty well-controlled on current regimen  2. Abdominal pain, chronic, epigastric Secondary to pancreatitis followed by GI.  I will give him a small refill of his Dilaudid but he does have a pain physician in [facility] scheduled to see next week  3. Alcohol dependence with unspecified alcohol-induced disorder (HCC) Staying off alcohol has not drank since initial presentation with pancreatitis  4. 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  Will be on anticoagulation indefinitely   Jacalyn Lefevre, MD [facility] [phone]

## 2023-07-06 NOTE — Patient Instructions (Signed)
Be sure and follow-up with an eye Dr and your pain management appt next week

## 2023-07-07 ENCOUNTER — Encounter (HOSPITAL_BASED_OUTPATIENT_CLINIC_OR_DEPARTMENT_OTHER): Payer: Self-pay | Admitting: Emergency Medicine

## 2023-07-07 ENCOUNTER — Emergency Department (HOSPITAL_BASED_OUTPATIENT_CLINIC_OR_DEPARTMENT_OTHER): Payer: Medicaid Other

## 2023-07-07 ENCOUNTER — Other Ambulatory Visit: Payer: Self-pay

## 2023-07-07 ENCOUNTER — Inpatient Hospital Stay (HOSPITAL_BASED_OUTPATIENT_CLINIC_OR_DEPARTMENT_OTHER)
Admission: EM | Admit: 2023-07-07 | Discharge: 2023-07-10 | DRG: 439 | Disposition: A | Discharge status: Home / Self Care | Payer: Medicaid Other | Attending: Internal Medicine | Admitting: Internal Medicine

## 2023-07-07 DIAGNOSIS — Z86718 Personal history of other venous thrombosis and embolism: Secondary | ICD-10-CM

## 2023-07-07 DIAGNOSIS — K859 Acute pancreatitis without necrosis or infection, unspecified: Secondary | ICD-10-CM | POA: Diagnosis present

## 2023-07-07 DIAGNOSIS — T189XXA Foreign body of alimentary tract, part unspecified, initial encounter: Secondary | ICD-10-CM

## 2023-07-07 DIAGNOSIS — G8929 Other chronic pain: Secondary | ICD-10-CM | POA: Diagnosis present

## 2023-07-07 DIAGNOSIS — F419 Anxiety disorder, unspecified: Secondary | ICD-10-CM | POA: Diagnosis present

## 2023-07-07 DIAGNOSIS — E119 Type 2 diabetes mellitus without complications: Secondary | ICD-10-CM | POA: Diagnosis present

## 2023-07-07 DIAGNOSIS — K858 Other acute pancreatitis without necrosis or infection: Principal | ICD-10-CM | POA: Diagnosis present

## 2023-07-07 DIAGNOSIS — K8021 Calculus of gallbladder without cholecystitis with obstruction: Secondary | ICD-10-CM | POA: Diagnosis present

## 2023-07-07 DIAGNOSIS — T85898A Other specified complication of other internal prosthetic devices, implants and grafts, initial encounter: Secondary | ICD-10-CM

## 2023-07-07 DIAGNOSIS — Z8249 Family history of ischemic heart disease and other diseases of the circulatory system: Secondary | ICD-10-CM

## 2023-07-07 DIAGNOSIS — E861 Hypovolemia: Secondary | ICD-10-CM | POA: Diagnosis present

## 2023-07-07 DIAGNOSIS — Z833 Family history of diabetes mellitus: Secondary | ICD-10-CM

## 2023-07-07 DIAGNOSIS — K219 Gastro-esophageal reflux disease without esophagitis: Secondary | ICD-10-CM | POA: Diagnosis present

## 2023-07-07 DIAGNOSIS — E871 Hypo-osmolality and hyponatremia: Secondary | ICD-10-CM | POA: Diagnosis present

## 2023-07-07 DIAGNOSIS — F1011 Alcohol abuse, in remission: Secondary | ICD-10-CM | POA: Diagnosis present

## 2023-07-07 DIAGNOSIS — E860 Dehydration: Secondary | ICD-10-CM | POA: Diagnosis present

## 2023-07-07 DIAGNOSIS — K860 Alcohol-induced chronic pancreatitis: Secondary | ICD-10-CM | POA: Diagnosis present

## 2023-07-07 DIAGNOSIS — Z794 Long term (current) use of insulin: Secondary | ICD-10-CM

## 2023-07-07 DIAGNOSIS — K760 Fatty (change of) liver, not elsewhere classified: Secondary | ICD-10-CM | POA: Diagnosis present

## 2023-07-07 DIAGNOSIS — F17290 Nicotine dependence, other tobacco product, uncomplicated: Secondary | ICD-10-CM | POA: Diagnosis present

## 2023-07-07 LAB — COMPREHENSIVE METABOLIC PANEL
ALT: 17 U/L (ref 0–44)
AST: 23 U/L (ref 15–41)
Albumin: 4.8 g/dL (ref 3.5–5.0)
Alkaline Phosphatase: 106 U/L (ref 38–126)
Anion gap: 20 — ABNORMAL HIGH (ref 5–15)
BUN: 10 mg/dL (ref 6–20)
CO2: 14 mmol/L — ABNORMAL LOW (ref 22–32)
Calcium: 9.2 mg/dL (ref 8.9–10.3)
Chloride: 95 mmol/L — ABNORMAL LOW (ref 98–111)
Creatinine, Ser: 0.98 mg/dL (ref 0.61–1.24)
GFR, Estimated: >60 mL/min (ref >60)
Glucose, Bld: 162 mg/dL — ABNORMAL HIGH (ref 70–99)
Potassium: 4.2 mmol/L (ref 3.5–5.1)
Sodium: 129 mmol/L — ABNORMAL LOW (ref 135–145)
Total Bilirubin: 0.9 mg/dL (ref 0.3–1.2)
Total Protein: 8.7 g/dL — ABNORMAL HIGH (ref 6.5–8.1)

## 2023-07-07 LAB — URINALYSIS, ROUTINE W REFLEX MICROSCOPIC
Bacteria, UA: NONE SEEN
Bilirubin Urine: NEGATIVE
Glucose, UA: >1000 mg/dL — AB
Ketones, ur: >80 mg/dL — AB
Leukocytes,Ua: NEGATIVE
Nitrite: NEGATIVE
Protein, ur: >300 mg/dL — AB
Specific Gravity, Urine: 1.041 — ABNORMAL HIGH (ref 1.005–1.030)
pH: 6 (ref 5.0–8.0)

## 2023-07-07 LAB — CBC
HCT: 43.7 % (ref 39.0–52.0)
Hemoglobin: 14.8 g/dL (ref 13.0–17.0)
MCH: 34.6 pg — ABNORMAL HIGH (ref 26.0–34.0)
MCHC: 33.9 g/dL (ref 30.0–36.0)
MCV: 102.1 fL — ABNORMAL HIGH (ref 80.0–100.0)
Platelets: 301 10*3/uL (ref 150–400)
RBC: 4.28 MIL/uL (ref 4.22–5.81)
RDW: 11.4 % — ABNORMAL LOW (ref 11.5–15.5)
WBC: 5.4 10*3/uL (ref 4.0–10.5)
nRBC: 0 % (ref 0.0–0.2)

## 2023-07-07 LAB — LIPASE, BLOOD: Lipase: 54 U/L — ABNORMAL HIGH (ref 11–51)

## 2023-07-07 MED ORDER — ONDANSETRON HCL 4 MG/2ML IJ SOLN
4.0000 mg | Freq: Once | INTRAMUSCULAR | Status: AC
  Administered 2023-07-07: 4 mg via INTRAVENOUS
  Filled 2023-07-07: qty 2

## 2023-07-07 MED ORDER — SODIUM CHLORIDE 0.9 % IV BOLUS
1000.0000 mL | Freq: Once | INTRAVENOUS | Status: AC
  Administered 2023-07-07: 1000 mL via INTRAVENOUS

## 2023-07-07 MED ORDER — KETOROLAC TROMETHAMINE 15 MG/ML IJ SOLN
15.0000 mg | Freq: Once | INTRAMUSCULAR | Status: AC
  Administered 2023-07-07: 15 mg via INTRAVENOUS
  Filled 2023-07-07: qty 1

## 2023-07-07 MED ORDER — IOHEXOL 300 MG/ML  SOLN
100.0000 mL | Freq: Once | INTRAMUSCULAR | Status: AC | PRN
  Administered 2023-07-07: 85 mL via INTRAVENOUS

## 2023-07-07 MED ORDER — HYDROMORPHONE HCL 1 MG/ML IJ SOLN
1.0000 mg | Freq: Once | INTRAMUSCULAR | Status: AC
  Administered 2023-07-07: 1 mg via INTRAVENOUS
  Filled 2023-07-07: qty 1

## 2023-07-07 NOTE — ED Notes (Signed)
Patient notified of need of urine sample, sample cup provided and clean catch instructions given

## 2023-07-07 NOTE — ED Provider Notes (Incomplete)
[place] [facility] AT [facility] Provider Note   CSN: [id number] Arrival date & time: 07/07/23  1637     History {Add pertinent medical, surgical, social history, OB history to HPI:1} Chief Complaint  Patient presents with  . Abdominal Pain    Carlos [name] is a 37 y.o. male with history of chronic pancreatitis and ERCP performed on Dr. Judie Petit 04/28/23 and 06/02/2023 by Dr. [name] with stent placement in the pancreas and common bile duct, presents with right upper quadrant abdominal pain.  He states his pain was well-controlled a couple days after the surgery but then he began to develop 6 out of 10 pain.  It started to worsen within the last week and is at a 8 out of 10.  He has been unable to keep any solid foods down but has been able to keep some liquids down.  He reports taking 1 mg Dilaudid at home which has not improved his pain very much.   Abdominal Pain      Home Medications Prior to Admission medications   Medication Sig Start Date End Date Taking? Authorizing Provider  Continuous Blood Gluc Sensor (DEXCOM G6 SENSOR) MISC 1 Device by Other route as directed. Change sensors every 10 days 11/17/22   [name], [name], MD  Continuous Blood Gluc Transmit (DEXCOM G6 TRANSMITTER) MISC 1 Device by Does not apply route as directed. 11/17/22   [name], [name], MD  HYDROmorphone (DILAUDID) 4 MG tablet Take 1 tablet (4 mg total) by mouth every 4 (four) hours as needed for pain, max daily dose: 6 Tablet. 06/13/23     insulin aspart (NOVOLOG) 100 UNIT/ML injection 60 units a day 12/14/22   [name], [name], MD  Insulin Disposable Pump (OMNIPOD 5 G6 POD, GEN 5,) MISC 1 Device by Does not apply route every 3 (three) days. 11/17/22   [name], [name], MD  insulin glargine (LANTUS SOLOSTAR) 100 UNIT/ML Solostar Pen Inject 16 Units into the skin See admin instructions. Inject 16 units into the skin once a day as directed when Omnipod  is not attached 03/10/22   [provider]  Insulin Pen Needle 32G X 4 MM MISC 1 Device by Does not apply route daily in the afternoon. 11/17/22   [name], [name], MD  lipase/protease/amylase (CREON) 36000 UNITS CPEP capsule Take 1-2 capsules (36,000-72,000 Units total) by mouth See admin instructions. Take 72,000 units by mouth three times a day before meals and 36,000 units with any snacks 06/23/23   [name], [name], MD  multivitamin (ONE-A-DAY MEN'S) TABS tablet Take 1 tablet by mouth daily with breakfast.    [provider]  naloxone (NARCAN) nasal spray 4 mg/0.1 mL Place 1 spray into the nose once. 04/05/23   [provider]  omeprazole (PRILOSEC) 40 MG capsule TAKE 1 CAPSULE (40 MG TOTAL) BY MOUTH DAILY. 05/03/23   [name], MD  ondansetron (ZOFRAN) 8 MG tablet Take 1 tablet (8 mg total) by mouth every 8 (eight) hours as needed for nausea or vomiting. 04/23/22   [name], MD      Allergies    Other    Review of Systems   Review of Systems  Gastrointestinal:  Positive for abdominal pain.    Physical Exam Updated Vital Signs BP 127/88 (BP Location: Right Arm)   Pulse 95   Temp (!) 97.5 F (36.4 C) (Oral)   Resp 15   Ht 6\' 5"  (1.956 m)   Wt 72.6 kg  SpO2 100%   BMI 18.97 kg/m  Physical Exam Vitals and nursing note reviewed.  Constitutional:      General: He is not in acute distress.    Appearance: He is well-developed.  HENT:     Head: Normocephalic and atraumatic.  Eyes:     Conjunctiva/sclera: Conjunctivae normal.  Cardiovascular:     Rate and Rhythm: Normal rate and regular rhythm.     Heart sounds: No murmur heard. Pulmonary:     Effort: Pulmonary effort is normal. No respiratory distress.     Breath sounds: Normal breath sounds.  Abdominal:     Palpations: Abdomen is soft.     Tenderness: There is no abdominal tenderness.     Comments: Right upper quadrant tenderness to palpation, no tenderness to  palpation of the left upper quadrant, left lower quadrant, right lower quadrant  Musculoskeletal:        General: No swelling.     Cervical back: Neck supple.  Skin:    General: Skin is warm and dry.     Capillary Refill: Capillary refill takes less than 2 seconds.  Neurological:     Mental Status: He is alert.  Psychiatric:        Mood and Affect: Mood normal.     ED Results / Procedures / Treatments   Labs (all labs ordered are listed, but only abnormal results are displayed) Labs Reviewed  LIPASE, BLOOD - Abnormal; Notable for the following components:      Result Value   Lipase 54 (*)    All other components within normal limits  COMPREHENSIVE METABOLIC PANEL - Abnormal; Notable for the following components:   Sodium 129 (*)    Chloride 95 (*)    CO2 14 (*)    Glucose, Bld 162 (*)    Total Protein 8.7 (*)    Anion gap 20 (*)    All other components within normal limits  CBC - Abnormal; Notable for the following components:   MCV 102.1 (*)    MCH 34.6 (*)    RDW 11.4 (*)    All other components within normal limits  URINALYSIS, ROUTINE W REFLEX MICROSCOPIC - Abnormal; Notable for the following components:   Specific Gravity, Urine 1.041 (*)    Glucose, UA >1,000 (*)    Hgb urine dipstick SMALL (*)    Ketones, ur >80 (*)    Protein, ur >300 (*)    All other components within normal limits    EKG None  Radiology CT ABDOMEN PELVIS W CONTRAST  Result Date: 07/07/2023 CLINICAL DATA:  Right upper quadrant abdominal pain. History of pancreatitis. EXAM: CT ABDOMEN AND PELVIS WITH CONTRAST TECHNIQUE: Multidetector CT imaging of the abdomen and pelvis was performed using the standard protocol following bolus administration of intravenous contrast. RADIATION DOSE REDUCTION: This exam was performed according to the departmental dose-optimization program which includes automated exposure control, adjustment of the mA and/or kV according to patient size and/or use of iterative  reconstruction technique. CONTRAST:  85mL OMNIPAQUE IOHEXOL 300 MG/ML  SOLN COMPARISON:  CT abdomen pelvis dated 04/22/2023. FINDINGS: Lower chest: The visualized lung bases are clear. No intra-abdominal free air or free fluid. Hepatobiliary: Fatty liver. No biliary ductal dilatation. Layering stones or sludge in the gallbladder. No pericholecystic fluid or evidence of acute cholecystitis by CT. There has been interval placement of a common bile duct stent. Pancreas: There is diffuse inflammatory changes and edema of the pancreas consistent with pancreatitis. A pancreatic duct stent is  seen. There is abutment of the tail of the pancreas to the greater curvature of the stomach with loss of fat plane consistent with adhesions. A 9 mm fluid density in or adjacent to the tail of the pancreas appears new since the prior CT and may represent a developing small pseudocyst or less likely focal dilatation of the distal pancreatic duct. Spleen: Normal in size without focal abnormality. Adrenals/Urinary Tract: The adrenal glands are unremarkable. The kidneys, visualized ureters, and urinary bladder appear unremarkable. Stomach/Bowel: There is no bowel obstruction or active inflammation. There is bandlike tethering of the splenic flexure to the distal pancreas suggestive of developing adhesions. The appendix is normal. Vascular/Lymphatic: The abdominal aorta and IVC are unremarkable. Upper abdominal varices and venous collaterals noted in the upper abdomen and porta hepatic. No portal venous gas. There is no adenopathy. Reproductive: The prostate and seminal vesicles are grossly unremarkable. No pelvic mass. Other: None Musculoskeletal: No acute or significant osseous findings. IMPRESSION: 1. Pancreatitis with a 9 mm hypodense focus in the pancreatic tail which may represent a developing pseudocyst or focal dilatation of the distal pancreatic duct. 2. Interval placement of a common bile duct stent and replacement of the  pancreatic duct stent. 3. Fatty liver. 4. Cholelithiasis. 5. No bowel obstruction. Normal appendix. Electronically Signed   By: Elgie Collard M.D.   On: 07/07/2023 21:28    Procedures Procedures  {Document cardiac monitor, telemetry assessment procedure when appropriate:1}  Medications Ordered in ED Medications  sodium chloride 0.9 % bolus 1,000 mL (0 mLs Intravenous Stopped 07/07/23 2214)  HYDROmorphone (DILAUDID) injection 1 mg (1 mg Intravenous Given 07/07/23 2033)  iohexol (OMNIPAQUE) 300 MG/ML solution 100 mL (85 mLs Intravenous Contrast Given 07/07/23 2052)  sodium chloride 0.9 % bolus 1,000 mL (1,000 mLs Intravenous New Bag/Given 07/07/23 2329)  ketorolac (TORADOL) 15 MG/ML injection 15 mg (15 mg Intravenous Given 07/07/23 2330)  ondansetron (ZOFRAN) injection 4 mg (4 mg Intravenous Given 07/07/23 2329)    ED Course/ Medical Decision Making/ A&P   {   Click here for ABCD2, HEART and other calculatorsREFRESH Note before signing :1}                              Medical Decision Making Amount and/or Complexity of Data Reviewed Labs: ordered. Radiology: ordered.  Risk Prescription drug management.   37 y.o. male with pertinent past medical history of chronic pancreatitis and ERCP performed on Dr. Judie Petit 04/28/23 and 06/02/2023 by Dr. [name] with stent placement in the pancreas and common bile duct, presents to the ED for concern of right upper quadrant pain for the past 3 weeks and difficulty keeping food down for the past 1 week  Differential diagnosis includes but is not limited to pancreatitis, cholelithiasis, cholangitis, stent blockage  ED Course:  Patient presents the ER with concern for right upper quadrant pain that has been worsening since his ERCP on 06/02/2023.  He originally did not have much pain initially after the procedure, however about 4 days after he was discharged, his pain started to worsen.  He has gradually been getting worse to the point where he is  unable to eat anything in the last week.  He is only able to keep some liquids down.  Pain still at a 8 out of 10 with IV Dilaudid.  Upon evaluation here, he has right upper quadrant pain, does appear slightly dehydrated.  He is borderline tachycardic going between 95 and  101. Patient was started on normal saline bolus given his dehydration and hyponatremia and 1 mg Dilaudid for pain. Labs showed no elevation in LFTs, lipase slightly elevated at 54.  CBC with slight hyponatremia at 129 CBC with no leukocytosis Urinalysis does show proteinuria and ketones, high specific gravity, suspect dehydration.  Patient has been started on normal saline bolus. CT abdomen pelvis was obtained which showed pancreatitis with a hypodense focus in the pancreatic tail, concerning for developing pseudocyst or dilation of the distal pancreatic duct.  No signs of stent blockage on CT imaging and given LFTs have not been elevated. I reached out to GI Dr. Tomasa Rand who recommends patient be admitted for IV fluids, pain control, antiemetics, and they will evaluate patient in the morning.  Impression: Pancreatitis   Disposition:  {AF ED Dispo:29713} Return precautions given.  Lab Tests: I Ordered, and personally interpreted labs.  The pertinent results include:   CBC without leukocytosis CMP without any elevation in LFTs, sodium at 129 Lipase slightly elevated at 54 Urinalysis with specific gravity above 1.041, proteinuria and ketones  Imaging Studies ordered: I ordered imaging studies including CT abdomen pelvis I independently visualized the imaging with scope of interpretation limited to determining acute life threatening conditions related to emergency care. Imaging showed pancreatitis with a hypodense focus in the pancreatic tail, possibly concerning for a pseudocyst or focal dilation of the distal pancreatic duct I agree with the radiologist interpretation   Cardiac Monitoring: / EKG: The patient was  maintained on a cardiac monitor.  I personally viewed and interpreted the cardiac monitored which showed an underlying rhythm of: Normal sinus rhythm with some episodes of sinus tachycardia   Consultations Obtained: I requested consultation with the GI Dr. Tomasa Rand,  and discussed lab and imaging findings as well as pertinent plan - they recommend: Admitting the patient for pain control, fluids, antiemetics and they will evaluate patient in the morning I requested consultation with the hospitalist Dr. Marland Kitchen  and discussed lab and imaging findings as well as pertinent plan - they recommend:   External records from outside source obtained and reviewed including notes from recent admission for ERCP on 06/02/2023   Co morbidities that complicate the patient evaluation  chronic pancreatitis and ERCP performed on Dr. Judie Petit 04/28/23 and 06/02/2023 by Dr. [name] with stent placement in the pancreas and common bile duct,      {Document critical care time when appropriate:1} {Document review of labs and clinical decision tools ie heart score, Chads2Vasc2 etc:1}  {Document your independent review of radiology images, and any outside records:1} {Document your discussion with family members, caretakers, and with consultants:1} {Document social determinants of health affecting pt's care:1} {Document your decision making why or why not admission, treatments were needed:1} Final Clinical Impression(s) / ED Diagnoses Final diagnoses:  None    Rx / DC Orders ED Discharge Orders     None

## 2023-07-07 NOTE — ED Provider Notes (Signed)
[place] [facility] DEPARTMENT AT [facility] Provider Note   CSN: [id number] Arrival date & time: 07/07/23  1637     History  Chief Complaint  Patient presents with   Abdominal Pain    Carlos [name] is a 37 y.o. male with history of chronic pancreatitis and ERCP performed on Dr. Judie Petit 04/28/23 and 06/02/2023 by Dr. [name] with stent placement in the pancreas and common bile duct, presents with right upper quadrant abdominal pain.  He states his pain was well-controlled a couple days after the surgery but then he began to develop 6 out of 10 pain.  It started to worsen within the last week and is at a 8 out of 10.  He has been unable to keep any solid foods down but has been able to keep some liquids down.  He reports taking 1 mg Dilaudid at home which has not improved his pain very much.   Abdominal Pain      Home Medications Prior to Admission medications   Medication Sig Start Date End Date Taking? Authorizing Provider  Continuous Blood Gluc Sensor (DEXCOM G6 SENSOR) MISC 1 Device by Other route as directed. Change sensors every 10 days 11/17/22   [name], [name], MD  Continuous Blood Gluc Transmit (DEXCOM G6 TRANSMITTER) MISC 1 Device by Does not apply route as directed. 11/17/22   [name], [name], MD  HYDROmorphone (DILAUDID) 4 MG tablet Take 1 tablet (4 mg total) by mouth every 4 (four) hours as needed for pain, max daily dose: 6 Tablet. 06/13/23     insulin aspart (NOVOLOG) 100 UNIT/ML injection 60 units a day 12/14/22   [name], [name], MD  Insulin Disposable Pump (OMNIPOD 5 G6 POD, GEN 5,) MISC 1 Device by Does not apply route every 3 (three) days. 11/17/22   [name], [name], MD  insulin glargine (LANTUS SOLOSTAR) 100 UNIT/ML Solostar Pen Inject 16 Units into the skin See admin instructions. Inject 16 units into the skin once a day as directed when Omnipod is not attached 03/10/22   [provider]  Insulin Pen Needle  32G X 4 MM MISC 1 Device by Does not apply route daily in the afternoon. 11/17/22   [name], [name], MD  lipase/protease/amylase (CREON) 36000 UNITS CPEP capsule Take 1-2 capsules (36,000-72,000 Units total) by mouth See admin instructions. Take 72,000 units by mouth three times a day before meals and 36,000 units with any snacks 06/23/23   [name], [name], MD  multivitamin (ONE-A-DAY MEN'S) TABS tablet Take 1 tablet by mouth daily with breakfast.    [provider]  naloxone (NARCAN) nasal spray 4 mg/0.1 mL Place 1 spray into the nose once. 04/05/23   [provider]  omeprazole (PRILOSEC) 40 MG capsule TAKE 1 CAPSULE (40 MG TOTAL) BY MOUTH DAILY. 05/03/23   [name], MD  ondansetron (ZOFRAN) 8 MG tablet Take 1 tablet (8 mg total) by mouth every 8 (eight) hours as needed for nausea or vomiting. 04/23/22   [name], MD      Allergies    Other    Review of Systems   Review of Systems  Gastrointestinal:  Positive for abdominal pain.    Physical Exam Updated Vital Signs BP 127/88 (BP Location: Right Arm)   Pulse 95   Temp (!) 97.5 F (36.4 C) (Oral)   Resp 15   Ht 6\' 5"  (1.956 m)   Wt 72.6 kg   SpO2 100%   BMI 18.97 kg/m  Physical Exam Vitals and nursing note reviewed.  Constitutional:      General: He is not in acute distress.    Appearance: He is well-developed.  HENT:     Head: Normocephalic and atraumatic.  Eyes:     Conjunctiva/sclera: Conjunctivae normal.  Cardiovascular:     Rate and Rhythm: Normal rate and regular rhythm.     Heart sounds: No murmur heard. Pulmonary:     Effort: Pulmonary effort is normal. No respiratory distress.     Breath sounds: Normal breath sounds.  Abdominal:     Palpations: Abdomen is soft.     Tenderness: There is no abdominal tenderness.     Comments: Right upper quadrant tenderness to palpation, no tenderness to palpation of the left upper quadrant, left lower quadrant, right lower  quadrant  Musculoskeletal:        General: No swelling.     Cervical back: Neck supple.  Skin:    General: Skin is warm and dry.     Capillary Refill: Capillary refill takes less than 2 seconds.  Neurological:     Mental Status: He is alert.  Psychiatric:        Mood and Affect: Mood normal.     ED Results / Procedures / Treatments   Labs (all labs ordered are listed, but only abnormal results are displayed) Labs Reviewed  LIPASE, BLOOD - Abnormal; Notable for the following components:      Result Value   Lipase 54 (*)    All other components within normal limits  COMPREHENSIVE METABOLIC PANEL - Abnormal; Notable for the following components:   Sodium 129 (*)    Chloride 95 (*)    CO2 14 (*)    Glucose, Bld 162 (*)    Total Protein 8.7 (*)    Anion gap 20 (*)    All other components within normal limits  CBC - Abnormal; Notable for the following components:   MCV 102.1 (*)    MCH 34.6 (*)    RDW 11.4 (*)    All other components within normal limits  URINALYSIS, ROUTINE W REFLEX MICROSCOPIC - Abnormal; Notable for the following components:   Specific Gravity, Urine 1.041 (*)    Glucose, UA >1,000 (*)    Hgb urine dipstick SMALL (*)    Ketones, ur >80 (*)    Protein, ur >300 (*)    All other components within normal limits    EKG None  Radiology CT ABDOMEN PELVIS W CONTRAST  Result Date: 07/07/2023 CLINICAL DATA:  Right upper quadrant abdominal pain. History of pancreatitis. EXAM: CT ABDOMEN AND PELVIS WITH CONTRAST TECHNIQUE: Multidetector CT imaging of the abdomen and pelvis was performed using the standard protocol following bolus administration of intravenous contrast. RADIATION DOSE REDUCTION: This exam was performed according to the departmental dose-optimization program which includes automated exposure control, adjustment of the mA and/or kV according to patient size and/or use of iterative reconstruction technique. CONTRAST:  85mL OMNIPAQUE IOHEXOL 300 MG/ML   SOLN COMPARISON:  CT abdomen pelvis dated 04/22/2023. FINDINGS: Lower chest: The visualized lung bases are clear. No intra-abdominal free air or free fluid. Hepatobiliary: Fatty liver. No biliary ductal dilatation. Layering stones or sludge in the gallbladder. No pericholecystic fluid or evidence of acute cholecystitis by CT. There has been interval placement of a common bile duct stent. Pancreas: There is diffuse inflammatory changes and edema of the pancreas consistent with pancreatitis. A pancreatic duct stent is seen. There is abutment of the tail of  the pancreas to the greater curvature of the stomach with loss of fat plane consistent with adhesions. A 9 mm fluid density in or adjacent to the tail of the pancreas appears new since the prior CT and may represent a developing small pseudocyst or less likely focal dilatation of the distal pancreatic duct. Spleen: Normal in size without focal abnormality. Adrenals/Urinary Tract: The adrenal glands are unremarkable. The kidneys, visualized ureters, and urinary bladder appear unremarkable. Stomach/Bowel: There is no bowel obstruction or active inflammation. There is bandlike tethering of the splenic flexure to the distal pancreas suggestive of developing adhesions. The appendix is normal. Vascular/Lymphatic: The abdominal aorta and IVC are unremarkable. Upper abdominal varices and venous collaterals noted in the upper abdomen and porta hepatic. No portal venous gas. There is no adenopathy. Reproductive: The prostate and seminal vesicles are grossly unremarkable. No pelvic mass. Other: None Musculoskeletal: No acute or significant osseous findings. IMPRESSION: 1. Pancreatitis with a 9 mm hypodense focus in the pancreatic tail which may represent a developing pseudocyst or focal dilatation of the distal pancreatic duct. 2. Interval placement of a common bile duct stent and replacement of the pancreatic duct stent. 3. Fatty liver. 4. Cholelithiasis. 5. No bowel  obstruction. Normal appendix. Electronically Signed   By: Elgie Collard M.D.   On: 07/07/2023 21:28    Procedures Procedures    Medications Ordered in ED Medications  sodium chloride 0.9 % bolus 1,000 mL (0 mLs Intravenous Stopped 07/07/23 2214)  HYDROmorphone (DILAUDID) injection 1 mg (1 mg Intravenous Given 07/07/23 2033)  iohexol (OMNIPAQUE) 300 MG/ML solution 100 mL (85 mLs Intravenous Contrast Given 07/07/23 2052)  sodium chloride 0.9 % bolus 1,000 mL (1,000 mLs Intravenous New Bag/Given 07/07/23 2329)  ketorolac (TORADOL) 15 MG/ML injection 15 mg (15 mg Intravenous Given 07/07/23 2330)  ondansetron (ZOFRAN) injection 4 mg (4 mg Intravenous Given 07/07/23 2329)    ED Course/ Medical Decision Making/ A&P                                 Medical Decision Making Amount and/or Complexity of Data Reviewed Labs: ordered. Radiology: ordered.  Risk Prescription drug management.   37 y.o. male with pertinent past medical history of chronic pancreatitis and ERCP performed on Dr. Judie Petit 04/28/23 and 06/02/2023 by Dr. [name] with stent placement in the pancreas and common bile duct, presents to the ED for concern of right upper quadrant pain for the past 3 weeks and difficulty keeping food down for the past 1 week  Differential diagnosis includes but is not limited to pancreatitis, cholelithiasis, cholangitis, stent blockage  ED Course:  Patient presents the ER with concern for right upper quadrant pain that has been worsening since his ERCP on 06/02/2023.  He originally did not have much pain initially after the procedure, however about 4 days after he was discharged, his pain started to worsen.  He has gradually been getting worse to the point where he is unable to eat anything in the last week.  He is only able to keep some liquids down.  Pain still at a 8 out of 10 with IV Dilaudid.  Upon evaluation here, he has right upper quadrant pain, does appear slightly dehydrated.  He is  borderline tachycardic going between 95 and 101. Patient was started on normal saline bolus given his dehydration and hyponatremia and 1 mg Dilaudid for pain. Labs showed no elevation in LFTs, lipase slightly elevated at  54.  CBC with slight hyponatremia at 129 CBC with no leukocytosis Urinalysis does show proteinuria and ketones, high specific gravity, suspect dehydration.  Patient has been started on normal saline bolus. CT abdomen pelvis was obtained which showed pancreatitis with a hypodense focus in the pancreatic tail, concerning for developing pseudocyst or dilation of the distal pancreatic duct.  No signs of stent blockage on CT imaging and given LFTs have not been elevated. I reached out to GI Dr. Tomasa Rand who recommends patient be admitted for IV fluids, pain control, antiemetics, and they will evaluate patient in the morning.  Impression: Pancreatitis   Disposition:  Admission to [facility] for pain control and evaluation by GI in the morning  Lab Tests: I Ordered, and personally interpreted labs.  The pertinent results include:   CBC without leukocytosis CMP without any elevation in LFTs, sodium at 129 Lipase slightly elevated at 54 Urinalysis with specific gravity above 1.041, proteinuria and ketones  Imaging Studies ordered: I ordered imaging studies including CT abdomen pelvis I independently visualized the imaging with scope of interpretation limited to determining acute life threatening conditions related to emergency care. Imaging showed pancreatitis with a hypodense focus in the pancreatic tail, possibly concerning for a pseudocyst or focal dilation of the distal pancreatic duct I agree with the radiologist interpretation   Cardiac Monitoring: / EKG: The patient was maintained on a cardiac monitor.  I personally viewed and interpreted the cardiac monitored which showed an underlying rhythm of: Normal sinus rhythm with some episodes of sinus  tachycardia   Consultations Obtained: I requested consultation with the GI Dr. Tomasa Rand,  and discussed lab and imaging findings as well as pertinent plan - they recommend: Admitting the patient for pain control, fluids, antiemetics and they will evaluate patient in the morning I requested consultation with the hospitalist Dr. Julian Reil  and discussed lab and imaging findings as well as pertinent plan - they recommend: Admission for pain control  External records from outside source obtained and reviewed including notes from recent admission for ERCP on 06/02/2023   Co morbidities that complicate the patient evaluation  chronic pancreatitis and ERCP performed on Dr. Judie Petit 04/28/23 and 06/02/2023 by Dr. [name] with stent placement in the pancreas and common bile duct,            Final Clinical Impression(s) / ED Diagnoses Final diagnoses:  Other acute pancreatitis, unspecified complication status    Rx / DC Orders ED Discharge Orders     None         Arabella Merles, PA-C 07/08/23 0025    Terald Sleeper, MD 07/08/23 1058

## 2023-07-07 NOTE — ED Triage Notes (Signed)
Pt states he had stents placed in pancreas and gallbladder 2 weeks ago. Pt arrives today with abdominal pain worsening since. Was told by surgeon that pancreatic stents would dissolve, but may need further surgery for gallbladder. Appt was made with one of the surgeon at end of September and that pain would be reoccurring. On pain management.

## 2023-07-08 DIAGNOSIS — Z9689 Presence of other specified functional implants: Secondary | ICD-10-CM

## 2023-07-08 DIAGNOSIS — E119 Type 2 diabetes mellitus without complications: Secondary | ICD-10-CM | POA: Diagnosis present

## 2023-07-08 DIAGNOSIS — E1010 Type 1 diabetes mellitus with ketoacidosis without coma: Secondary | ICD-10-CM | POA: Diagnosis not present

## 2023-07-08 DIAGNOSIS — K831 Obstruction of bile duct: Secondary | ICD-10-CM

## 2023-07-08 DIAGNOSIS — T183XXA Foreign body in small intestine, initial encounter: Secondary | ICD-10-CM | POA: Diagnosis not present

## 2023-07-08 DIAGNOSIS — Z8249 Family history of ischemic heart disease and other diseases of the circulatory system: Secondary | ICD-10-CM | POA: Diagnosis not present

## 2023-07-08 DIAGNOSIS — K219 Gastro-esophageal reflux disease without esophagitis: Secondary | ICD-10-CM | POA: Diagnosis present

## 2023-07-08 DIAGNOSIS — K861 Other chronic pancreatitis: Secondary | ICD-10-CM | POA: Diagnosis not present

## 2023-07-08 DIAGNOSIS — G8929 Other chronic pain: Secondary | ICD-10-CM

## 2023-07-08 DIAGNOSIS — E860 Dehydration: Secondary | ICD-10-CM | POA: Diagnosis present

## 2023-07-08 DIAGNOSIS — F419 Anxiety disorder, unspecified: Secondary | ICD-10-CM | POA: Diagnosis present

## 2023-07-08 DIAGNOSIS — E861 Hypovolemia: Secondary | ICD-10-CM | POA: Diagnosis present

## 2023-07-08 DIAGNOSIS — R1013 Epigastric pain: Secondary | ICD-10-CM

## 2023-07-08 DIAGNOSIS — K860 Alcohol-induced chronic pancreatitis: Secondary | ICD-10-CM | POA: Diagnosis present

## 2023-07-08 DIAGNOSIS — E871 Hypo-osmolality and hyponatremia: Secondary | ICD-10-CM | POA: Diagnosis present

## 2023-07-08 DIAGNOSIS — K8021 Calculus of gallbladder without cholecystitis with obstruction: Secondary | ICD-10-CM | POA: Diagnosis present

## 2023-07-08 DIAGNOSIS — K760 Fatty (change of) liver, not elsewhere classified: Secondary | ICD-10-CM | POA: Diagnosis present

## 2023-07-08 DIAGNOSIS — F17290 Nicotine dependence, other tobacco product, uncomplicated: Secondary | ICD-10-CM | POA: Diagnosis present

## 2023-07-08 DIAGNOSIS — Z86718 Personal history of other venous thrombosis and embolism: Secondary | ICD-10-CM | POA: Diagnosis not present

## 2023-07-08 DIAGNOSIS — K859 Acute pancreatitis without necrosis or infection, unspecified: Secondary | ICD-10-CM

## 2023-07-08 DIAGNOSIS — F1011 Alcohol abuse, in remission: Secondary | ICD-10-CM | POA: Diagnosis present

## 2023-07-08 DIAGNOSIS — K858 Other acute pancreatitis without necrosis or infection: Secondary | ICD-10-CM | POA: Diagnosis present

## 2023-07-08 DIAGNOSIS — E1065 Type 1 diabetes mellitus with hyperglycemia: Secondary | ICD-10-CM | POA: Diagnosis not present

## 2023-07-08 DIAGNOSIS — Z794 Long term (current) use of insulin: Secondary | ICD-10-CM | POA: Diagnosis not present

## 2023-07-08 DIAGNOSIS — Z833 Family history of diabetes mellitus: Secondary | ICD-10-CM | POA: Diagnosis not present

## 2023-07-08 LAB — GLUCOSE, CAPILLARY
Glucose-Capillary: 206 mg/dL — ABNORMAL HIGH (ref 70–99)
Glucose-Capillary: 147 mg/dL — ABNORMAL HIGH (ref 70–99)
Glucose-Capillary: 163 mg/dL — ABNORMAL HIGH (ref 70–99)
Glucose-Capillary: 223 mg/dL — ABNORMAL HIGH (ref 70–99)

## 2023-07-08 MED ORDER — ALBUTEROL SULFATE (2.5 MG/3ML) 0.083% IN NEBU: 2.5000 mg | INHALATION_SOLUTION | RESPIRATORY_TRACT | Status: DC | PRN

## 2023-07-08 MED ORDER — INSULIN GLARGINE-YFGN 100 UNIT/ML ~~LOC~~ SOLN
6.0000 [IU] | Freq: Every day | SUBCUTANEOUS | Status: DC
  Administered 2023-07-08 – 2023-07-10 (×3): 6 [IU] via SUBCUTANEOUS
  Filled 2023-07-08 (×3): qty 0.06

## 2023-07-08 MED ORDER — HYDROMORPHONE HCL 1 MG/ML IJ SOLN
1.0000 mg | INTRAMUSCULAR | Status: DC | PRN
  Administered 2023-07-08 – 2023-07-10 (×12): 1 mg via INTRAVENOUS
  Filled 2023-07-08 (×12): qty 1

## 2023-07-08 MED ORDER — INSULIN ASPART 100 UNIT/ML IJ SOLN
0.0000 [IU] | INTRAMUSCULAR | Status: DC
  Administered 2023-07-08: 5 [IU] via SUBCUTANEOUS
  Administered 2023-07-08: 3 [IU] via SUBCUTANEOUS
  Administered 2023-07-08: 5 [IU] via SUBCUTANEOUS
  Administered 2023-07-08: 2 [IU] via SUBCUTANEOUS
  Administered 2023-07-09: 3 [IU] via SUBCUTANEOUS
  Administered 2023-07-09: 2 [IU] via SUBCUTANEOUS
  Administered 2023-07-09: 3 [IU] via SUBCUTANEOUS
  Administered 2023-07-09: 2 [IU] via SUBCUTANEOUS
  Administered 2023-07-09 – 2023-07-10 (×2): 5 [IU] via SUBCUTANEOUS
  Administered 2023-07-10: 3 [IU] via SUBCUTANEOUS
  Administered 2023-07-10: 2 [IU] via SUBCUTANEOUS

## 2023-07-08 MED ORDER — ONDANSETRON HCL 4 MG/2ML IJ SOLN: 4.0000 mg | Freq: Four times a day (QID) | INTRAMUSCULAR | Status: DC | PRN

## 2023-07-08 MED ORDER — ENOXAPARIN SODIUM 40 MG/0.4ML IJ SOSY
40.0000 mg | PREFILLED_SYRINGE | INTRAMUSCULAR | Status: DC
  Administered 2023-07-08 – 2023-07-09 (×2): 40 mg via SUBCUTANEOUS
  Filled 2023-07-08 (×2): qty 0.4

## 2023-07-08 MED ORDER — HYDROMORPHONE HCL 2 MG PO TABS
4.0000 mg | ORAL_TABLET | ORAL | Status: DC | PRN
  Administered 2023-07-08 – 2023-07-09 (×5): 4 mg via ORAL
  Filled 2023-07-08 (×5): qty 2

## 2023-07-08 MED ORDER — HYDROMORPHONE HCL 1 MG/ML IJ SOLN
1.0000 mg | Freq: Once | INTRAMUSCULAR | Status: AC
  Administered 2023-07-08: 1 mg via INTRAVENOUS
  Filled 2023-07-08: qty 1

## 2023-07-08 MED ORDER — SODIUM CHLORIDE 0.9 % IV SOLN: INTRAVENOUS | Status: DC

## 2023-07-08 NOTE — Progress Notes (Addendum)
Patient arrived to [facility]  with [facility].  Patient is alert and oriented X4.  Ambulatory.  Independent.  Nurse has notified MD to that patient is here on unit.

## 2023-07-08 NOTE — ED Notes (Addendum)
ED TO INPATIENT HANDOFF REPORT  ED Nurse Name and Phone #: Ines Bloomer RN [phone]  S Name/Age/Gender Carlos [name] 37 y.o. male Room/Bed: DB006/DB006  Code Status   Code Status: Prior  Home/SNF/Other Home Patient oriented to: self, place, time, and situation Is this baseline? Yes   Triage Complete: Triage complete  Chief Complaint Pancreatitis [K85.90]  Triage Note Pt states he had stents placed in pancreas and gallbladder 2 weeks ago. Pt arrives today with abdominal pain worsening since. Was told by surgeon that pancreatic stents would dissolve, but may need further surgery for gallbladder. Appt was made with one of the surgeon at end of September and that pain would be reoccurring. On pain management.    Allergies Allergies  Allergen Reactions   Other Anaphylaxis and Other (See Comments)    [name] - anaphylaxis    Level of Care/Admitting Diagnosis ED Disposition     ED Disposition  Admit   Condition  --   Comment  Hospital Area: [facility] [[id number]]  Level of Care: Med-Surg [16]  Interfacility transfer: Yes  May place patient in observation at [facility] or [facility] if equivalent level of care is available:: Yes  Covid Evaluation: Asymptomatic - no recent exposure (last 10 days) testing not required  Diagnosis: Pancreatitis [[id number]]  Admitting Physician: Arlean Hopping [[id number]]  Attending Physician: Arlean Hopping [[id number]]          B Medical/Surgery History Past Medical History:  Diagnosis Date   AKI (acute kidney injury) (HCC) 12/21/2021   Anxiety    Diabetes (HCC)    Pancreatitis    Splenic vein thrombosis    Past Surgical History:  Procedure Laterality Date   BILIARY BRUSHING  02/02/2023   Procedure: BILIARY BRUSHING;  Surgeon: Lemar Lofty., MD;  Location: [facility];  Service: Gastroenterology;;   BILIARY BRUSHING  04/28/2023   Procedure: BILIARY BRUSHING;  Surgeon: Meryl Dare, MD;   Location: [facility] ENDOSCOPY;  Service: Gastroenterology;;   BILIARY BRUSHING  06/02/2023   Procedure: BILIARY BRUSHING;  Surgeon: Lemar Lofty., MD;  Location: [facility];  Service: Gastroenterology;;   BILIARY DILATION  12/01/2022   Procedure: BILIARY DILATION;  Surgeon: Lemar Lofty., MD;  Location: [facility];  Service: Gastroenterology;;   BILIARY DILATION  02/02/2023   Procedure: BILIARY DILATION;  Surgeon: Lemar Lofty., MD;  Location: [facility];  Service: Gastroenterology;;   BILIARY STENT PLACEMENT  04/28/2023   Procedure: BILIARY STENT PLACEMENT;  Surgeon: Meryl Dare, MD;  Location: [facility] END[facility]COPY;  Service: Gastroenterology;;   BILIARY STENT PLACEMENT N/A 06/02/2023   Procedure: BILIARY STENT PLACEMENT;  Surgeon: Lemar Lofty., MD;  Location: [facility] ENDOSCOPY;  Service: Gastroenterology;  Laterality: N/A;   BIOPSY  12/02/2021   Procedure: BIOPSY;  Surgeon: [name] [name], MD;  Location: [facility];  Service: Gastroenterology;;   ENDOSCOPIC RETROGRADE CHOLANGIOPANCREATOGRAPHY (ERCP) WITH PROPOFOL N/A 08/12/2022   Procedure: ENDOSCOPIC RETROGRADE CHOLANGIOPANCREATOGRAPHY (ERCP) WITH PROPOFOL;  Surgeon: Lemar Lofty., MD;  Location: [facility];  Service: Gastroenterology;  Laterality: N/A;   ENDOSCOPIC RETROGRADE CHOLANGIOPANCREATOGRAPHY (ERCP) WITH PROPOFOL N/A 12/01/2022   Procedure: ENDOSCOPIC RETROGRADE CHOLANGIOPANCREATOGRAPHY (ERCP) WITH PROPOFOL;  Surgeon: [name] [name], MD;  Location: [facility];  Service: Gastroenterology;  Laterality: N/A;   ENDOSCOPIC RETROGRADE CHOLANGIOPANCREATOGRAPHY (ERCP) WITH PROPOFOL N/A 02/02/2023   Procedure: ENDOSCOPIC RETROGRADE CHOLANGIOPANCREATOGRAPHY (ERCP) WITH PROPOFOL;  Surgeon: [name] [name], MD;  Location: [facility];  Service: Gastroenterology;  Laterality: N/A;   ENDOSCOPIC RETROGRADE CHOLANGIOPANCREATOGRAPHY (ERCP) WITH PROPOFOL  N/A 06/02/2023   Procedure:  ENDOSCOPIC RETROGRADE CHOLANGIOPANCREATOGRAPHY (ERCP) WITH PROPOFOL;  Surgeon: [name] [name], MD;  Location: [facility];  Service: Gastroenterology;  Laterality: N/A;   ERCP N/A 04/28/2023   Procedure: ENDOSCOPIC RETROGRADE CHOLANGIOPANCREATOGRAPHY (ERCP);  Surgeon: Meryl Dare, MD;  Location: [facility] ENDOSCOPY;  Service: Gastroenterology;  Laterality: N/A;   ESOPHAGOGASTRODUODENOSCOPY N/A 12/01/2022   Procedure: ESOPHAGOGASTRODUODENOSCOPY (EGD);  Surgeon: Lemar Lofty., MD;  Location: [facility];  Service: Gastroenterology;  Laterality: N/A;   ESOPHAGOGASTRODUODENOSCOPY (EGD) WITH PROPOFOL N/A 12/02/2021   Procedure: ESOPHAGOGASTRODUODENOSCOPY (EGD) WITH PROPOFOL;  Surgeon: [name] [name], MD;  Location: [facility];  Service: Gastroenterology;  Laterality: N/A;   FINE NEEDLE ASPIRATION N/A 12/01/2022   Procedure: FINE NEEDLE ASPIRATION (FNA) LINEAR;  Surgeon: Lemar Lofty., MD;  Location: [facility];  Service: Gastroenterology;  Laterality: N/A;   NEUROLYTIC CELIAC PLEXUS  12/01/2022   Procedure: CELIAC PLEXUS BLOCK;  Surgeon: [name], [name], MD;  Location: [facility];  Service: Gastroenterology;;   NO PAST SURGERIES     PANCREATIC STENT PLACEMENT  08/12/2022   Procedure: PANCREATIC STENT PLACEMENT;  Surgeon: Lemar Lofty., MD;  Location: [facility];  Service: Gastroenterology;;   PANCREATIC STENT PLACEMENT  12/01/2022   Procedure: PANCREATIC STENT PLACEMENT;  Surgeon: Lemar Lofty., MD;  Location: [facility];  Service: Gastroenterology;;   PANCREATIC STENT PLACEMENT  02/02/2023   Procedure: PANCREATIC STENT PLACEMENT;  Surgeon: Lemar Lofty., MD;  Location: [facility];  Service: Gastroenterology;;   PANCREATIC STENT PLACEMENT  06/02/2023   Procedure: PANCREATIC STENT PLACEMENT;  Surgeon: Lemar Lofty., MD;  Location: [facility];  Service: Gastroenterology;;   REMOVAL OF STONES  12/01/2022    Procedure: REMOVAL OF STONES;  Surgeon: Lemar Lofty., MD;  Location: [facility];  Service: Gastroenterology;;   REMOVAL OF STONES  02/02/2023   Procedure: REMOVAL OF STONES;  Surgeon: Lemar Lofty., MD;  Location: [facility];  Service: Gastroenterology;;   REMOVAL OF STONES  06/02/2023   Procedure: REMOVAL OF STONES;  Surgeon: Lemar Lofty., MD;  Location: [facility];  Service: Gastroenterology;;   [name]  08/12/2022   Procedure: [name];  Surgeon: Lemar Lofty., MD;  Location: [facility];  Service: Gastroenterology;;   [name]  12/01/2022   Procedure: [name];  Surgeon: Lemar Lofty., MD;  Location: [facility];  Service: Gastroenterology;;   [name]  06/02/2023   Procedure: [name];  Surgeon: Lemar Lofty., MD;  Location: [facility];  Service: Gastroenterology;;   Francine Graven REMOVAL  12/01/2022   Procedure: STENT REMOVAL;  Surgeon: Lemar Lofty., MD;  Location: [facility];  Service: Gastroenterology;;   Francine Graven REMOVAL  02/02/2023   Procedure: STENT REMOVAL;  Surgeon: Lemar Lofty., MD;  Location: [facility];  Service: Gastroenterology;;   Francine Graven REMOVAL  06/02/2023   Procedure: STENT REMOVAL;  Surgeon: Lemar Lofty., MD;  Location: [facility];  Service: Gastroenterology;;   UPPER ESOPHAGEAL ENDOSCOPIC ULTRASOUND (EUS) N/A 12/02/2021   Procedure: UPPER ESOPHAGEAL ENDOSCOPIC ULTRASOUND (EUS);  Surgeon: Lemar Lofty., MD;  Location: [facility];  Service: Gastroenterology;  Laterality: N/A;   UPPER ESOPHAGEAL ENDOSCOPIC ULTRASOUND (EUS) N/A 12/01/2022   Procedure: UPPER ESOPHAGEAL ENDOSCOPIC ULTRASOUND (EUS);  Surgeon: Lemar Lofty., MD;  Location: [facility];  Service: Gastroenterology;  Laterality: N/A;     A IV Location/Drains/Wounds Patient Lines/Drains/Airways Status     Active Line/Drains/Airways     Name Placement date  Placement time Site Days   Peripheral IV 07/07/23 20 G 1"  Left Antecubital 07/07/23  2032  Antecubital  1   GI Stent 06/02/23  1051  --  36   GI Stent 06/02/23  1052  --  36            Intake/Output Last 24 hours  Intake/Output Summary (Last 24 hours) at 07/08/2023 0052 Last data filed at 07/08/2023 0052 Gross per 24 hour  Intake 2005.7 ml  Output --  Net 2005.7 ml    Labs/Imaging Results for orders placed or performed during the hospital encounter of 07/07/23 (from the past 48 hour(s))  Lipase, blood     Status: Abnormal   Collection Time: 07/07/23  4:56 PM  Result Value Ref Range   Lipase 54 (H) 11 - 51 U/L    Comment: Performed at [facility], [address], [place], [place] [address]  Comprehensive metabolic panel     Status: Abnormal   Collection Time: 07/07/23  4:56 PM  Result Value Ref Range   Sodium 129 (L) 135 - 145 mmol/L   Potassium 4.2 3.5 - 5.1 mmol/L   Chloride 95 (L) 98 - 111 mmol/L   CO2 14 (L) 22 - 32 mmol/L   Glucose, Bld 162 (H) 70 - 99 mg/dL    Comment: Glucose reference range applies only to samples taken after fasting for at least 8 hours.   BUN 10 6 - 20 mg/dL   Creatinine, Ser 4.69 0.61 - 1.24 mg/dL   Calcium 9.2 8.9 - 62.9 mg/dL   Total Protein 8.7 (H) 6.5 - 8.1 g/dL   Albumin 4.8 3.5 - 5.0 g/dL   AST 23 15 - 41 U/L   ALT 17 0 - 44 U/L   Alkaline Phosphatase 106 38 - 126 U/L   Total Bilirubin 0.9 0.3 - 1.2 mg/dL   GFR, Estimated >52 >84 mL/min    Comment: (NOTE) Calculated using the CKD-EPI Creatinine Equation (2021)    Anion gap 20 (H) 5 - 15    Comment: Performed at [facility], [address] [place], [place] [address]  CBC     Status: Abnormal   Collection Time: 07/07/23  4:56 PM  Result Value Ref Range   WBC 5.4 4.0 - 10.5 K/uL   RBC 4.28 4.22 - 5.81 MIL/uL   Hemoglobin 14.8 13.0 - 17.0 g/dL   HCT 01.0 27.2 - 53.6 %   MCV 102.1 (H) 80.0 - 100.0 fL   MCH 34.6 (H) 26.0 - 34.0 pg   MCHC  33.9 30.0 - 36.0 g/dL   RDW 64.4 (L) 03.4 - 74.2 %   Platelets 301 150 - 400 K/uL   nRBC 0.0 0.0 - 0.2 %    Comment: Performed at [facility], [address] [place], [place] [address]  Urinalysis, Routine w reflex microscopic -Urine, Clean Catch     Status: Abnormal   Collection Time: 07/07/23  7:12 PM  Result Value Ref Range   Color, Urine YELLOW YELLOW   APPearance CLEAR CLEAR   Specific Gravity, Urine 1.041 (H) 1.005 - 1.030   pH 6.0 5.0 - 8.0   Glucose, UA >1,000 (A) NEGATIVE mg/dL   Hgb urine dipstick SMALL (A) NEGATIVE   Bilirubin Urine NEGATIVE NEGATIVE   Ketones, ur >80 (A) NEGATIVE mg/dL   Protein, ur >875 (A) NEGATIVE mg/dL   Nitrite NEGATIVE NEGATIVE   Leukocytes,Ua NEGATIVE NEGATIVE   RBC / HPF 0-5 0 - 5 RBC/hpf   WBC, UA 0-5 0 - 5 WBC/hpf   Bacteria,  UA NONE SEEN NONE SEEN   Squamous Epithelial / HPF 0-5 0 - 5 /HPF   Mucus PRESENT    Hyaline Casts, UA PRESENT     Comment: Performed at [facility], [address] [place], [place] [address]   CT ABDOMEN PELVIS W CONTRAST  Result Date: 07/07/2023 CLINICAL DATA:  Right upper quadrant abdominal pain. History of pancreatitis. EXAM: CT ABDOMEN AND PELVIS WITH CONTRAST TECHNIQUE: Multidetector CT imaging of the abdomen and pelvis was performed using the standard protocol following bolus administration of intravenous contrast. RADIATION DOSE REDUCTION: This exam was performed according to the departmental dose-optimization program which includes automated exposure control, adjustment of the mA and/or kV according to patient size and/or use of iterative reconstruction technique. CONTRAST:  85mL OMNIPAQUE IOHEXOL 300 MG/ML  SOLN COMPARISON:  CT abdomen pelvis dated 04/22/2023. FINDINGS: Lower chest: The visualized lung bases are clear. No intra-abdominal free air or free fluid. Hepatobiliary: Fatty liver. No biliary ductal dilatation. Layering stones or sludge in the gallbladder. No  pericholecystic fluid or evidence of acute cholecystitis by CT. There has been interval placement of a common bile duct stent. Pancreas: There is diffuse inflammatory changes and edema of the pancreas consistent with pancreatitis. A pancreatic duct stent is seen. There is abutment of the tail of the pancreas to the greater curvature of the stomach with loss of fat plane consistent with adhesions. A 9 mm fluid density in or adjacent to the tail of the pancreas appears new since the prior CT and may represent a developing small pseudocyst or less likely focal dilatation of the distal pancreatic duct. Spleen: Normal in size without focal abnormality. Adrenals/Urinary Tract: The adrenal glands are unremarkable. The kidneys, visualized ureters, and urinary bladder appear unremarkable. Stomach/Bowel: There is no bowel obstruction or active inflammation. There is bandlike tethering of the splenic flexure to the distal pancreas suggestive of developing adhesions. The appendix is normal. Vascular/Lymphatic: The abdominal aorta and IVC are unremarkable. Upper abdominal varices and venous collaterals noted in the upper abdomen and porta hepatic. No portal venous gas. There is no adenopathy. Reproductive: The prostate and seminal vesicles are grossly unremarkable. No pelvic mass. Other: None Musculoskeletal: No acute or significant osseous findings. IMPRESSION: 1. Pancreatitis with a 9 mm hypodense focus in the pancreatic tail which may represent a developing pseudocyst or focal dilatation of the distal pancreatic duct. 2. Interval placement of a common bile duct stent and replacement of the pancreatic duct stent. 3. Fatty liver. 4. Cholelithiasis. 5. No bowel obstruction. Normal appendix. Electronically Signed   By: Elgie Collard M.D.   On: 07/07/2023 21:28    Pending Labs Unresulted Labs (From admission, onward)    None       Vitals/Pain Today's Vitals   07/07/23 2000 07/07/23 2045 07/07/23 2113 07/07/23 2130   BP: (!) 149/98   127/88  Pulse: (!) 101   95  Resp: 18   15  Temp:    (!) 97.5 F (36.4 C)  TempSrc:    Oral  SpO2: 100%   100%  Weight:      Height:      PainSc:  9  8      Isolation Precautions No active isolations  Medications Medications  sodium chloride 0.9 % bolus 1,000 mL (0 mLs Intravenous Stopped 07/07/23 2214)  HYDROmorphone (DILAUDID) injection 1 mg (1 mg Intravenous Given 07/07/23 2033)  iohexol (OMNIPAQUE) 300 MG/ML solution 100 mL (85 mLs Intravenous Contrast Given 07/07/23 2052)  sodium chloride 0.9 %  bolus 1,000 mL (0 mLs Intravenous Stopped 07/08/23 0052)  ketorolac (TORADOL) 15 MG/ML injection 15 mg (15 mg Intravenous Given 07/07/23 2330)  ondansetron (ZOFRAN) injection 4 mg (4 mg Intravenous Given 07/07/23 2329)    Mobility walks       R Recommendations: See Admitting Provider Note  Report given to:   Additional Notes: Patient to see GI in AM.  Being admitted for pain control secondary to pancreatitis.

## 2023-07-08 NOTE — H&P (View-Only) (Signed)
Referring Provider: [facility] Primary Care Physician:  Caesar Bookman, NP Primary Gastroenterologist:  Dr. [name]  Reason for Consultation: Chronic pancreatitis, nausea, vomiting, abdominal pain, poor p.o. intake  HPI: Carlos [name] is a 37 y.o. 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  Has chronic pancreatitis related to previous extensive alcohol use although no alcohol use since November 2021.  He presented to the hospital on this occasion with progressive abdominal pain and nausea/vomiting, unable to tolerate solid food, but has been tolerating liquids.  He says that for about a week after his procedure on 06/02/2023 he did well, but since then pain has been progressive.  Labs fairly unremarkable except a low sodium at 129.  CT abdomen and pelvis with contrast yesterday, 8/29:  IMPRESSION: 1. Pancreatitis with a 9 mm hypodense focus in the pancreatic tail which may represent a developing pseudocyst or focal dilatation of the distal pancreatic duct. 2. Interval placement of a common bile duct stent and replacement of the pancreatic duct stent. 3. Fatty liver. 4. Cholelithiasis. 5. No bowel obstruction. Normal appendix.  Most recent GI procedures:  ERCP on 04/28/2023: - A single moderately severe localized biliary stricture was found in the lower third of the main bile duct. The stricture was benign appearing. Brushed for cytology. - The common bile duct was mildly dilated, secondary to the stricture. - One plastic stent was placed into the common bile duct. - Prior pancreatic and biliary sphincterotomies were noted. - Two PD stents in good postion were noted - both appeared clogged.  ERCP on 06/02/2023:  - Prior biliary sphincterotomy appeared open. Biliary stent was in position. This was removed and sent for cytology. -  Prior pancreatic sphincterotomy appeared open. 2 pancreatic stents were in position. These were removed. - A single moderate biliary stricture was found in the lower third of the main bile duct. The stricture was inflammatory in appearance. This was brushed for cytology (previous brushings were negative). - The upper third of the main bile duct and middle third of the main bile duct were mildly dilated, likely due to the inflammatory appearing stricture noted above. - The biliary tree was swept and sludge was found. The gallbladder began to fill on cholangiogram. - Biliary sphincterotomy extension was performed. - One plastic biliary stent was placed into the common bile duct. Had difficulty placing 2 separate biliary stents so just the left one in place. - A filling defect consistent with sludge was seen on the ventral pancreatogram. The pancreatic duct was swept with findings of multiple pancreatic stones. Further pancreatogram did not suggest significant stricturing still present. - One temporary plastic pancreatic stent was placed into the ventral pancreatic duct to decrease post ERCP pancreatitis risk.  Past Medical History:  Diagnosis Date   AKI (acute kidney injury) (HCC) 12/21/2021   Anxiety    Diabetes (HCC)    Pancreatitis    Splenic vein thrombosis     Past Surgical History:  Procedure Laterality Date   BILIARY BRUSHING  02/02/2023   Procedure: BILIARY BRUSHING;  Surgeon: Lemar Lofty., MD;  Location: [facility];  Service: Gastroenterology;;   BILIARY BRUSHING  04/28/2023   Procedure: BILIARY BRUSHING;  Surgeon: Meryl Dare, MD;  Location: [facility] ENDOSCOPY;  Service: Gastroenterology;;   BILIARY BRUSHING  06/02/2023   Procedure: BILIARY BRUSHING;  Surgeon: Lemar Lofty., MD;  Location: [facility]OSCOPY;  Service: Gastroenterology;;   BILIARY DILATION  12/01/2022   Procedure: BILIARY DILATION;  Surgeon: Lemar Lofty., MD;  Location: [facility];  Service:  Gastroenterology;;   BILIARY DILATION  02/02/2023   Procedure: BILIARY DILATION;  Surgeon: Lemar Lofty., MD;  Location: [facility];  Service: Gastroenterology;;   BILIARY STENT PLACEMENT  04/28/2023   Procedure: BILIARY STENT PLACEMENT;  Surgeon: Meryl Dare, MD;  Location: [facility] ENDOSCOPY;  Service: Gastroenterology;;   BILIARY STENT PLACEMENT N/A 06/02/2023   Procedure: BILIARY STENT PLACEMENT;  Surgeon: Lemar Lofty., MD;  Location: [facility];  Service: Gastroenterology;  Laterality: N/A;   BIOPSY  12/02/2021   Procedure: BIOPSY;  Surgeon: [name] [name], MD;  Location: [facility];  Service: Gastroenterology;;   ENDOSCOPIC RETROGRADE CHOLANGIOPANCREATOGRAPHY (ERCP) WITH PROPOFOL N/A 08/12/2022   Procedure: ENDOSCOPIC RETROGRADE CHOLANGIOPANCREATOGRAPHY (ERCP) WITH PROPOFOL;  Surgeon: Lemar Lofty., MD;  Location: [facility];  Service: Gastroenterology;  Laterality: N/A;   ENDOSCOPIC RETROGRADE CHOLANGIOPANCREATOGRAPHY (ERCP) WITH PROPOFOL N/A 12/01/2022   Procedure: ENDOSCOPIC RETROGRADE CHOLANGIOPANCREATOGRAPHY (ERCP) WITH PROPOFOL;  Surgeon: [name] [name], MD;  Location: [facility];  Service: Gastroenterology;  Laterality: N/A;   ENDOSCOPIC RETROGRADE CHOLANGIOPANCREATOGRAPHY (ERCP) WITH PROPOFOL N/A 02/02/2023   Procedure: ENDOSCOPIC RETROGRADE CHOLANGIOPANCREATOGRAPHY (ERCP) WITH PROPOFOL;  Surgeon: [name] [name], MD;  Location: [facility];  Service: Gastroenterology;  Laterality: N/A;   ENDOSCOPIC RETROGRADE CHOLANGIOPANCREATOGRAPHY (ERCP) WITH PROPOFOL N/A 06/02/2023   Procedure: ENDOSCOPIC RETROGRADE CHOLANGIOPANCREATOGRAPHY (ERCP) WITH PROPOFOL;  Surgeon: [name] [name], MD;  Location: [facility];  Service: Gastroenterology;  Laterality: N/A;   ERCP N/A 04/28/2023   Procedure: ENDOSCOPIC RETROGRADE CHOLANGIOPANCREATOGRAPHY (ERCP);  Surgeon: Meryl Dare, MD;  Location: [facility] ENDOSCOPY;  Service:  Gastroenterology;  Laterality: N/A;   ESOPHAGOGASTRODUODENOSCOPY N/A 12/01/2022   Procedure: ESOPHAGOGASTRODUODENOSCOPY (EGD);  Surgeon: Lemar Lofty., MD;  Location: [facility];  Service: Gastroenterology;  Laterality: N/A;   ESOPHAGOGASTRODUODENOSCOPY (EGD) WITH PROPOFOL N/A 12/02/2021   Procedure: ESOPHAGOGASTRODUODENOSCOPY (EGD) WITH PROPOFOL;  Surgeon: [name] [name], MD;  Location: [facility];  Service: Gastroenterology;  Laterality: N/A;   FINE NEEDLE ASPIRATION N/A 12/01/2022   Procedure: FINE NEEDLE ASPIRATION (FNA) LINEAR;  Surgeon: Lemar Lofty., MD;  Location: [facility];  Service: Gastroenterology;  Laterality: N/A;   NEUROLYTIC CELIAC PLEXUS  12/01/2022   Procedure: CELIAC PLEXUS BLOCK;  Surgeon: [name], [name], MD;  Location: [facility];  Service: Gastroenterology;;   NO PAST SURGERIES     PANCREATIC STENT PLACEMENT  08/12/2022   Procedure: PANCREATIC STENT PLACEMENT;  Surgeon: Lemar Lofty., MD;  Location: [facility];  Service: Gastroenterology;;   PANCREATIC STENT PLACEMENT  12/01/2022   Procedure: PANCREATIC STENT PLACEMENT;  Surgeon: Lemar Lofty., MD;  Location: [facility];  Service: Gastroenterology;;   PANCREATIC STENT PLACEMENT  02/02/2023   Procedure: PANCREATIC STENT PLACEMENT;  Surgeon: Lemar Lofty., MD;  Location: [facility];  Service: Gastroenterology;;   PANCREATIC STENT PLACEMENT  06/02/2023   Procedure: PANCREATIC STENT PLACEMENT;  Surgeon: Lemar Lofty., MD;  Location: [facility];  Service: Gastroenterology;;   REMOVAL OF STONES  12/01/2022   Procedure: REMOVAL OF STONES;  Surgeon: Lemar Lofty., MD;  Location: [facility];  Service: Gastroenterology;;   REMOVAL OF STONES  02/02/2023   Procedure: REMOVAL OF STONES;  Surgeon: Lemar Lofty., MD;  Location: [facility];  Service: Gastroenterology;;   REMOVAL OF STONES  06/02/2023   Procedure: REMOVAL OF STONES;   Surgeon: Lemar Lofty., MD;  Location: [facility];  Service:  Gastroenterology;;   [name]  08/12/2022   Procedure: [name];  Surgeon: [name] [name], MD;  Location: [facility];  Service: Gastroenterology;;   [name]  12/01/2022   Procedure: [name];  Surgeon: [name] [name], MD;  Location: [facility];  Service: Gastroenterology;;   [name]  06/02/2023   Procedure: [name];  Surgeon: [name] [name], MD;  Location: [facility];  Service: Gastroenterology;;   Francine Graven REMOVAL  12/01/2022   Procedure: STENT REMOVAL;  Surgeon: Lemar Lofty., MD;  Location: [facility];  Service: Gastroenterology;;   Francine Graven REMOVAL  02/02/2023   Procedure: STENT REMOVAL;  Surgeon: Lemar Lofty., MD;  Location: [facility];  Service: Gastroenterology;;   Francine Graven REMOVAL  06/02/2023   Procedure: STENT REMOVAL;  Surgeon: Lemar Lofty., MD;  Location: [facility];  Service: Gastroenterology;;   UPPER ESOPHAGEAL ENDOSCOPIC ULTRASOUND (EUS) N/A 12/02/2021   Procedure: UPPER ESOPHAGEAL ENDOSCOPIC ULTRASOUND (EUS);  Surgeon: Lemar Lofty., MD;  Location: [facility];  Service: Gastroenterology;  Laterality: N/A;   UPPER ESOPHAGEAL ENDOSCOPIC ULTRASOUND (EUS) N/A 12/01/2022   Procedure: UPPER ESOPHAGEAL ENDOSCOPIC ULTRASOUND (EUS);  Surgeon: Lemar Lofty., MD;  Location: [facility];  Service: Gastroenterology;  Laterality: N/A;    Prior to Admission medications   Medication Sig Start Date End Date Taking? Authorizing Provider  HYDROmorphone (DILAUDID) 4 MG tablet Take 1 tablet (4 mg total) by mouth every 4 (four) hours as needed for pain, max daily dose: 6 Tablet. 06/13/23  Yes   insulin aspart (NOVOLOG) 100 UNIT/ML injection 60 units a day Patient taking differently: Inject 60 Units into the skin daily. 60 units a day 12/14/22  Yes [name], [name], MD  lipase/protease/amylase (CREON)  36000 UNITS CPEP capsule Take 1-2 capsules (36,000-72,000 Units total) by mouth See admin instructions. Take 72,000 units by mouth three times a day before meals and 36,000 units with any snacks 06/23/23  Yes [name], [name], MD  multivitamin (ONE-A-DAY MEN'S) TABS tablet Take 1 tablet by mouth daily with breakfast.   Yes [provider]  naloxone (NARCAN) nasal spray 4 mg/0.1 mL Place 1 spray into the nose once. 04/05/23  Yes [provider]  omeprazole (PRILOSEC) 40 MG capsule TAKE 1 CAPSULE (40 MG TOTAL) BY MOUTH DAILY. 05/03/23  Yes [name], MD  Continuous Blood Gluc Sensor (DEXCOM G6 SENSOR) MISC 1 Device by Other route as directed. Change sensors every 10 days 11/17/22   [name], [name], MD  Continuous Blood Gluc Transmit (DEXCOM G6 TRANSMITTER) MISC 1 Device by Does not apply route as directed. 11/17/22   [name], [name], MD  Insulin Disposable Pump (OMNIPOD 5 G6 POD, GEN 5,) MISC 1 Device by Does not apply route every 3 (three) days. 11/17/22   [name], [name], MD  Insulin Pen Needle 32G X 4 MM MISC 1 Device by Does not apply route daily in the afternoon. 11/17/22   [name], [name], MD  ondansetron (ZOFRAN) 8 MG tablet Take 1 tablet (8 mg total) by mouth every 8 (eight) hours as needed for nausea or vomiting. 04/23/22   [name], MD    Current Facility-Administered Medications  Medication Dose Route Frequency Provider Last Rate Last Admin   0.9 %  sodium chloride infusion   Intravenous Continuous Kirby Crigler, Mir M, MD 100 mL/hr at 07/08/23 0810 New Bag at 07/08/23 0810   albuterol (PROVENTIL) (2.5 MG/3ML) 0.083% nebulizer solution 2.5 mg  2.5 mg Nebulization Q2H PRN Maryln Gottron, MD  enoxaparin (LOVENOX) injection 40 mg  40 mg Subcutaneous Q24H Kirby Crigler, Mir M, MD       HYDROmorphone (DILAUDID) injection 1 mg  1 mg Intravenous Q3H PRN Kirby Crigler, Mir M, MD   1 mg at 07/08/23 0810   HYDROmorphone  (DILAUDID) tablet 4 mg  4 mg Oral Q4H PRN Kirby Crigler, Mir M, MD       insulin aspart (novoLOG) injection 0-15 Units  0-15 Units Subcutaneous Q4H Kirby Crigler, Mir M, MD   5 Units at 07/08/23 6010   insulin glargine-yfgn [facility]) injection 6 Units  6 Units Subcutaneous Daily Kirby Crigler, Mir M, MD       ondansetron [facility]) injection 4 mg  4 mg Intravenous Q6H PRN Maryln Gottron, MD        Allergies as of 07/07/2023 - Review Complete 07/07/2023  Allergen Reaction Noted   Other Anaphylaxis and Other (See Comments) 09/26/2020    Family History  Problem Relation Age of Onset   Hypertension Mother    Alcoholism Maternal Uncle    Diabetes Maternal Grandmother    Diabetes Maternal Grandfather    Diabetes Paternal Grandmother    Diabetes Paternal Grandfather    Colon cancer Neg Hx    Esophageal cancer Neg Hx    Inflammatory bowel disease Neg Hx    Liver disease Neg Hx    Pancreatic cancer Neg Hx    Rectal cancer Neg Hx    Stomach cancer Neg Hx     Social History   Socioeconomic History   Marital status: Single    Spouse name: Not on file   Number of children: 6   Years of education: Not on file   Highest education level: Associate degree: academic program  Occupational History   Occupation: Museum/gallery exhibitions officer  Tobacco Use   Smoking status: Some Days    Types: Cigars    Start date: 11/08/2002   Smokeless tobacco: Never   Tobacco comments:    Once a week.  Vaping Use   Vaping status: Never Used  Substance and Sexual Activity   Alcohol use: Not Currently   Drug use: Never   Sexual activity: Not on file  Other Topics Concern   Not on file  Social History Narrative   Not on file   Social Determinants of Health   Financial Resource Strain: Low Risk  (04/05/2023)   Overall Financial Resource Strain (CARDIA)    Difficulty of Paying Living Expenses: Not hard at all  Food Insecurity: No Food Insecurity (04/23/2023)   Hunger Vital Sign    Worried About Running Out of Food in the  Last Year: Never true    Ran Out of Food in the Last Year: Never true  Transportation Needs: No Transportation Needs (04/23/2023)   PRAPARE - Administrator, Civil Service (Medical): No    Lack of Transportation (Non-Medical): No  Physical Activity: Sufficiently Active (04/05/2023)   Exercise Vital Sign    Days of Exercise per Week: 4 days    Minutes of Exercise per Session: 150+ min  Stress: Stress Concern Present (04/05/2023)   [employer] of Occupational Health - Occupational Stress Questionnaire    Feeling of Stress : To some extent  Social Connections: Moderately Isolated (04/05/2023)   Social Connection and Isolation Panel [NHANES]    Frequency of Communication with Friends and Family: More than three times a week    Frequency of Social Gatherings with Friends and Family: Three times a week    Attends Religious Services:  Never    Active Member of Clubs or Organizations: No    Attends Banker Meetings: Not on file    Marital Status: Married  Intimate Partner Violence: Not At Risk (07/08/2023)   Humiliation, Afraid, Rape, and Kick questionnaire    Fear of Current or Ex-Partner: No    Emotionally Abused: No    Physically Abused: No    Sexually Abused: No    Review of Systems: ROS is O/W negative except as mentioned in HPI.  Physical Exam: Vital signs in last 24 hours: Temp:  [97.5 F (36.4 C)-99.1 F (37.3 C)] 98.1 F (36.7 C) (08/30 0527) Pulse Rate:  [90-111] 96 (08/30 0527) Resp:  [12-18] 17 (08/30 0527) BP: (118-158)/(78-101) 158/99 (08/30 0527) SpO2:  [99 %-100 %] 100 % (08/30 0527) Weight:  [72.6 kg] 72.6 kg (08/29 1651)   General:  Alert, Well-developed, well-nourished, pleasant and cooperative in NAD Head:  Normocephalic and atraumatic. Eyes:  Sclera clear, no icterus.  Conjunctiva pink. Ears:  Normal auditory acuity. Mouth:  No deformity or lesions.   Lungs:  Clear throughout to auscultation.  No wheezes, crackles, or rhonchi.   Heart:  Regular rate and rhythm; no murmurs, clicks, rubs, or gallops. Abdomen:  Soft, non-distended.  BS present.  Mild upper abdominal TTP.   Msk:  Symmetrical without gross deformities. Pulses:  Normal pulses noted. Extremities:  Without clubbing or edema. Neurologic:  Alert and oriented x 4;  grossly normal neurologically. Skin:  Intact without significant lesions or rashes. Psych:  Alert and cooperative. Normal mood and affect.  Intake/Output from previous day: 08/29 0701 - 08/30 0700 In: 2005.7 [IV Piggyback:2005.7] Out: -   Lab Results: Recent Labs    07/07/23 1656  WBC 5.4  HGB 14.8  HCT 43.7  PLT 301   BMET Recent Labs    07/07/23 1656  NA 129*  K 4.2  CL 95*  CO2 14*  GLUCOSE 162*  BUN 10  CREATININE 0.98  CALCIUM 9.2   LFT Recent Labs    07/07/23 1656  PROT 8.7*  ALBUMIN 4.8  AST 23  ALT 17  ALKPHOS 106  BILITOT 0.9   Studies/Results: CT ABDOMEN PELVIS W CONTRAST  Result Date: 07/07/2023 CLINICAL DATA:  Right upper quadrant abdominal pain. History of pancreatitis. EXAM: CT ABDOMEN AND PELVIS WITH CONTRAST TECHNIQUE: Multidetector CT imaging of the abdomen and pelvis was performed using the standard protocol following bolus administration of intravenous contrast. RADIATION DOSE REDUCTION: This exam was performed according to the departmental dose-optimization program which includes automated exposure control, adjustment of the mA and/or kV according to patient size and/or use of iterative reconstruction technique. CONTRAST:  85mL OMNIPAQUE IOHEXOL 300 MG/ML  SOLN COMPARISON:  CT abdomen pelvis dated 04/22/2023. FINDINGS: Lower chest: The visualized lung bases are clear. No intra-abdominal free air or free fluid. Hepatobiliary: Fatty liver. No biliary ductal dilatation. Layering stones or sludge in the gallbladder. No pericholecystic fluid or evidence of acute cholecystitis by CT. There has been interval placement of a common bile duct stent. Pancreas:  There is diffuse inflammatory changes and edema of the pancreas consistent with pancreatitis. A pancreatic duct stent is seen. There is abutment of the tail of the pancreas to the greater curvature of the stomach with loss of fat plane consistent with adhesions. A 9 mm fluid density in or adjacent to the tail of the pancreas appears new since the prior CT and may represent a developing small pseudocyst or less likely focal dilatation of  the distal pancreatic duct. Spleen: Normal in size without focal abnormality. Adrenals/Urinary Tract: The adrenal glands are unremarkable. The kidneys, visualized ureters, and urinary bladder appear unremarkable. Stomach/Bowel: There is no bowel obstruction or active inflammation. There is bandlike tethering of the splenic flexure to the distal pancreas suggestive of developing adhesions. The appendix is normal. Vascular/Lymphatic: The abdominal aorta and IVC are unremarkable. Upper abdominal varices and venous collaterals noted in the upper abdomen and porta hepatic. No portal venous gas. There is no adenopathy. Reproductive: The prostate and seminal vesicles are grossly unremarkable. No pelvic mass. Other: None Musculoskeletal: No acute or significant osseous findings. IMPRESSION: 1. Pancreatitis with a 9 mm hypodense focus in the pancreatic tail which may represent a developing pseudocyst or focal dilatation of the distal pancreatic duct. 2. Interval placement of a common bile duct stent and replacement of the pancreatic duct stent. 3. Fatty liver. 4. Cholelithiasis. 5. No bowel obstruction. Normal appendix. Electronically Signed   By: Elgie Collard M.D.   On: 07/07/2023 21:28    IMPRESSION:  37 year old male with a history of chronic pancreatitis with PD stricture s/p PD stent with history of alcohol use disorder (abstinent from alcohol since 09/2020).  Has had multiple procedures/ERCPs with stents.  His CT scan this admission shows a 9 mm hypodense focus in the pancreatic  tail which may represent a developing pseudocyst or focal dilatation of the distal pancreatic duct.  Common bile duct stent and replacement of pancreatic ducts stent noted.  PLAN: -? Intervention this admission, removal of pancreatic duct stent.  Will discuss with Dr. Marina Goodell. -He has an appointment in the office with Dr. [name] on 9/18. -Otherwise IV hydration, pain control, antiemetics.  Princella Pellegrini. Zehr  07/08/2023, 9:28 AM  GI ATTENDING  History, laboratories, x-rays reviewed.  Prior endoscopy reports reviewed.  Patient seen and examined.  Agree with comprehensive consultation note as outlined above. Patient presents with mild acute on chronic pancreatitis.  No overwhelming changes on CT.  Continue with supportive care and pain management.  Advance diet as tolerated. I am planning on pulling the temporary pancreatic duct stent, as it has not fallen out at, now, 4 weeks.  He is set up for endoscopy in the morning to accomplish this task.The nature of the procedure, as well as the risks, benefits, and alternatives were carefully and thoroughly reviewed with the patient. Ample time for discussion and questions allowed. The patient understood, was satisfied, and agreed to proceed. He does have routine outpatient follow-up with Dr. [name] in a few weeks.  Wilhemina Bonito. Eda Keys., M.D. [facility] Division of Gastroenterology

## 2023-07-08 NOTE — Progress Notes (Signed)
   07/08/23 1037  TOC Brief Assessment  Insurance and Status Reviewed  Patient has primary care physician Yes  Home environment has been reviewed Resides with significant other  Prior level of function: Independent at baseline  Prior/Current Home Services No current home services  Social Determinants of Health Reivew SDOH reviewed no interventions necessary  Readmission risk has been reviewed Yes  Transition of care needs no transition of care needs at this time

## 2023-07-08 NOTE — H&P (Signed)
History and Physical  Scout Viens WGN:[id number] DOB: January 23, 1986 DOA: 07/07/2023  PCP: Caesar Bookman, NP   Chief Complaint: Abdominal pain  HPI: Carlos [name] is a 37 y.o. male with medical history significant for prior alcohol abuse, chronic pancreatitis with strictures and multiple biliary brushings, and pancreatic/biliary stents being admitted to the hospital with recurrent abdominal pain and concern for pseudocyst formation.  Patient is followed as an outpatient by [facility] gastroenterology Dr. [name], most recently 7/25 he had placement of biliary and pancreatic stent as well as extension of his sphincterotomy.  He tolerated the procedure well, is scheduled to follow-up with Dr. [name] in a couple of weeks.  Unfortunately, for the last 2 to 3 weeks, he has had progressive worsening of abdominal pain, requiring him to take increasing doses of Dilaudid.  Denies any fevers, has had nausea, and vomiting with solid food intake.  For the last 3 to 4 days, has only been able to tolerate liquids.  Typically when he has flares of his pancreatitis, abdominal pain is epigastric and radiates to his back, in this case it is a little different because it is more severe than usual, and more in the right upper quadrant.  Denies any cough or chest pain, no changes in bowel habits.  ED Course: He presented for evaluation at [facility], was noted to be hypertensive and borderline tachycardic.  Afebrile and saturating well on room air.  Lab work significant for sodium 129, lipase 54.  CT of the mid and pelvis was performed as below.  ER provider discussed with [facility] on-call, who recommended hospital admission to [facility].  Review of Systems: Please see HPI for pertinent positives and negatives. A complete 10 system review of systems are otherwise negative.  Past Medical History:  Diagnosis Date   AKI (acute kidney injury) (HCC) 12/21/2021   Anxiety    Diabetes (HCC)    Pancreatitis     Splenic vein thrombosis    Past Surgical History:  Procedure Laterality Date   BILIARY BRUSHING  02/02/2023   Procedure: BILIARY BRUSHING;  Surgeon: [name] [name], MD;  Location: [facility] ENDOSCOPY;  Service: Gastroenterology;;   BILIARY BRUSHING  04/28/2023   Procedure: BILIARY BRUSHING;  Surgeon: Meryl Dare, MD;  Location: [facility] ENDOSCOPY;  Service: Gastroenterology;;   BILIARY BRUSHING  06/02/2023   Procedure: BILIARY BRUSHING;  Surgeon: Lemar Lofty., MD;  Location: [facility] ENDOSCOPY;  Service: Gastroenterology;;   BILIARY DILATION  12/01/2022   Procedure: BILIARY DILATION;  Surgeon: Lemar Lofty., MD;  Location: [facility] ENDOSCOPY;  Service: Gastroenterology;;   BILIARY DILATION  02/02/2023   Procedure: BILIARY DILATION;  Surgeon: Lemar Lofty., MD;  Location: [facility] ENDOSCOPY;  Service: Gastroenterology;;   BILIARY STENT PLACEMENT  04/28/2023   Procedure: BILIARY STENT PLACEMENT;  Surgeon: Meryl Dare, MD;  Location: [facility]OSCOPY;  Service: Gastroenterology;;   BILIARY STENT PLACEMENT N/A 06/02/2023   Procedure: BILIARY STENT PLACEMENT;  Surgeon: Lemar Lofty., MD;  Location: [facility] ENDOSCOPY;  Service: Gastroenterology;  Laterality: N/A;   BIOPSY  12/02/2021   Procedure: BIOPSY;  Surgeon: [name] [name], MD;  Location: [facility] ENDOSCOPY;  Service: Gastroenterology;;   ENDOSCOPIC RETROGRADE CHOLANGIOPANCREATOGRAPHY (ERCP) WITH PROPOFOL N/A 08/12/2022   Procedure: ENDOSCOPIC RETROGRADE CHOLANGIOPANCREATOGRAPHY (ERCP) WITH PROPOFOL;  Surgeon: Lemar Lofty., MD;  Location: [facility] ENDOSCOPY;  Service: Gastroenterology;  Laterality: N/A;   ENDOSCOPIC RETROGRADE CHOLANGIOPANCREATOGRAPHY (ERCP) WITH PROPOFOL N/A 12/01/2022   Procedure: ENDOSCOPIC RETROGRADE CHOLANGIOPANCREATOGRAPHY (ERCP) WITH PROPOFOL;  Surgeon: [name]  Montez Hageman., MD;  Location: [facility] ENDOSCOPY;  Service: Gastroenterology;  Laterality: N/A;   ENDOSCOPIC RETROGRADE CHOLANGIOPANCREATOGRAPHY  (ERCP) WITH PROPOFOL N/A 02/02/2023   Procedure: ENDOSCOPIC RETROGRADE CHOLANGIOPANCREATOGRAPHY (ERCP) WITH PROPOFOL;  Surgeon: [name] [name], MD;  Location: [facility] ENDOSCOPY;  Service: Gastroenterology;  Laterality: N/A;   ENDOSCOPIC RETROGRADE CHOLANGIOPANCREATOGRAPHY (ERCP) WITH PROPOFOL N/A 06/02/2023   Procedure: ENDOSCOPIC RETROGRADE CHOLANGIOPANCREATOGRAPHY (ERCP) WITH PROPOFOL;  Surgeon: [name] [name], MD;  Location: [facility] ENDOSCOPY;  Service: Gastroenterology;  Laterality: N/A;   ERCP N/A 04/28/2023   Procedure: ENDOSCOPIC RETROGRADE CHOLANGIOPANCREATOGRAPHY (ERCP);  Surgeon: Meryl Dare, MD;  Location: [facility] ENDOSCOPY;  Service: Gastroenterology;  Laterality: N/A;   ESOPHAGOGASTRODUODENOSCOPY N/A 12/01/2022   Procedure: ESOPHAGOGASTRODUODENOSCOPY (EGD);  Surgeon: Lemar Lofty., MD;  Location: [facility] ENDOSCOPY;  Service: Gastroenterology;  Laterality: N/A;   ESOPHAGOGASTRODUODENOSCOPY (EGD) WITH PROPOFOL N/A 12/02/2021   Procedure: ESOPHAGOGASTRODUODENOSCOPY (EGD) WITH PROPOFOL;  Surgeon: [name] [name], MD;  Location: [facility] ENDOSCOPY;  Service: Gastroenterology;  Laterality: N/A;   FINE NEEDLE ASPIRATION N/A 12/01/2022   Procedure: FINE NEEDLE ASPIRATION (FNA) LINEAR;  Surgeon: Lemar Lofty., MD;  Location: [facility] ENDOSCOPY;  Service: Gastroenterology;  Laterality: N/A;   NEUROLYTIC CELIAC PLEXUS  12/01/2022   Procedure: CELIAC PLEXUS BLOCK;  Surgeon: [name], [name], MD;  Location: [facility] ENDOSCOPY;  Service: Gastroenterology;;   NO PAST SURGERIES     PANCREATIC STENT PLACEMENT  08/12/2022   Procedure: PANCREATIC STENT PLACEMENT;  Surgeon: Lemar Lofty., MD;  Location: [facility] ENDOSCOPY;  Service: Gastroenterology;;   PANCREATIC STENT PLACEMENT  12/01/2022   Procedure: PANCREATIC STENT PLACEMENT;  Surgeon: Lemar Lofty., MD;  Location: [facility] ENDOSCOPY;  Service: Gastroenterology;;   PANCREATIC STENT PLACEMENT  02/02/2023   Procedure: PANCREATIC  STENT PLACEMENT;  Surgeon: Lemar Lofty., MD;  Location: [facility] ENDOSCOPY;  Service: Gastroenterology;;   PANCREATIC STENT PLACEMENT  06/02/2023   Procedure: PANCREATIC STENT PLACEMENT;  Surgeon: Lemar Lofty., MD;  Location: [facility] ENDOSCOPY;  Service: Gastroenterology;;   REMOVAL OF STONES  12/01/2022   Procedure: REMOVAL OF STONES;  Surgeon: Lemar Lofty., MD;  Location: [facility] ENDOSCOPY;  Service: Gastroenterology;;   REMOVAL OF STONES  02/02/2023   Procedure: REMOVAL OF STONES;  Surgeon: Lemar Lofty., MD;  Location: [facility] ENDOSCOPY;  Service: Gastroenterology;;   REMOVAL OF STONES  06/02/2023   Procedure: REMOVAL OF STONES;  Surgeon: Lemar Lofty., MD;  Location: [facility] ENDOSCOPY;  Service: Gastroenterology;;   [name]  08/12/2022   Procedure: [name];  Surgeon: Lemar Lofty., MD;  Location: [facility] ENDOSCOPY;  Service: Gastroenterology;;   [name]  12/01/2022   Procedure: [name];  Surgeon: Lemar Lofty., MD;  Location: [facility] ENDOSCOPY;  Service: Gastroenterology;;   [name]  06/02/2023   Procedure: [name];  Surgeon: Lemar Lofty., MD;  Location: [facility] ENDOSCOPY;  Service: Gastroenterology;;   Francine Graven REMOVAL  12/01/2022   Procedure: STENT REMOVAL;  Surgeon: Lemar Lofty., MD;  Location: [facility] ENDOSCOPY;  Service: Gastroenterology;;   Francine Graven REMOVAL  02/02/2023   Procedure: STENT REMOVAL;  Surgeon: Lemar Lofty., MD;  Location: [facility] ENDOSCOPY;  Service: Gastroenterology;;   Francine Graven REMOVAL  06/02/2023   Procedure: STENT REMOVAL;  Surgeon: Lemar Lofty., MD;  Location: [facility] ENDOSCOPY;  Service: Gastroenterology;;   UPPER ESOPHAGEAL ENDOSCOPIC ULTRASOUND (EUS) N/A 12/02/2021   Procedure: UPPER ESOPHAGEAL ENDOSCOPIC ULTRASOUND (EUS);  Surgeon: Lemar Lofty., MD;  Location: [facility]COPY;  Service: Gastroenterology;  Laterality: N/A;   UPPER ESOPHAGEAL ENDOSCOPIC ULTRASOUND (EUS) N/A  12/01/2022   Procedure: UPPER  ESOPHAGEAL ENDOSCOPIC ULTRASOUND (EUS);  Surgeon: Lemar Lofty., MD;  Location: [facility] ENDOSCOPY;  Service: Gastroenterology;  Laterality: N/A;    Social History:  reports that he has been smoking cigars. He started smoking about 20 years ago. He has never used smokeless tobacco. He reports that he does not currently use alcohol. He reports that he does not use drugs.   Allergies  Allergen Reactions   Other Anaphylaxis and Other (See Comments)    [name] - anaphylaxis    Family History  Problem Relation Age of Onset   Hypertension Mother    Alcoholism Maternal Uncle    Diabetes Maternal Grandmother    Diabetes Maternal Grandfather    Diabetes Paternal Grandmother    Diabetes Paternal Grandfather    Colon cancer Neg Hx    Esophageal cancer Neg Hx    Inflammatory bowel disease Neg Hx    Liver disease Neg Hx    Pancreatic cancer Neg Hx    Rectal cancer Neg Hx    Stomach cancer Neg Hx      Prior to Admission medications   Medication Sig Start Date End Date Taking? Authorizing Provider  Continuous Blood Gluc Sensor (DEXCOM G6 SENSOR) MISC 1 Device by Other route as directed. Change sensors every 10 days 11/17/22   [name], [name], MD  Continuous Blood Gluc Transmit (DEXCOM G6 TRANSMITTER) MISC 1 Device by Does not apply route as directed. 11/17/22   [name], [name], MD  HYDROmorphone (DILAUDID) 4 MG tablet Take 1 tablet (4 mg total) by mouth every 4 (four) hours as needed for pain, max daily dose: 6 Tablet. 06/13/23     insulin aspart (NOVOLOG) 100 UNIT/ML injection 60 units a day 12/14/22   [name], [name], MD  Insulin Disposable Pump (OMNIPOD 5 G6 POD, GEN 5,) MISC 1 Device by Does not apply route every 3 (three) days. 11/17/22   [name], [name], MD  insulin glargine (LANTUS SOLOSTAR) 100 UNIT/ML Solostar Pen Inject 16 Units into the skin See admin instructions. Inject 16 units into the skin once a day  as directed when Omnipod is not attached 03/10/22   [provider]  Insulin Pen Needle 32G X 4 MM MISC 1 Device by Does not apply route daily in the afternoon. 11/17/22   [name], [name], MD  lipase/protease/amylase (CREON) 36000 UNITS CPEP capsule Take 1-2 capsules (36,000-72,000 Units total) by mouth See admin instructions. Take 72,000 units by mouth three times a day before meals and 36,000 units with any snacks 06/23/23   [name], [name], MD  multivitamin (ONE-A-DAY MEN'S) TABS tablet Take 1 tablet by mouth daily with breakfast.    [provider]  naloxone (NARCAN) nasal spray 4 mg/0.1 mL Place 1 spray into the nose once. 04/05/23   [provider]  omeprazole (PRILOSEC) 40 MG capsule TAKE 1 CAPSULE (40 MG TOTAL) BY MOUTH DAILY. 05/03/23   [name], MD  ondansetron (ZOFRAN) 8 MG tablet Take 1 tablet (8 mg total) by mouth every 8 (eight) hours as needed for nausea or vomiting. 04/23/22   [name], MD    Physical Exam: BP (!) 158/99 (BP Location: Left Arm)   Pulse 96   Temp 98.1 F (36.7 C) (Oral)   Resp 17   Ht 6\' 5"  (1.956 m)   Wt 72.6 kg   SpO2 100%   BMI 18.97 kg/m   General:  Alert, oriented, calm, in no acute distress, resting comfortably, pleasant and cooperative.  Does  not appear to be acutely ill. Eyes: EOMI, clear conjuctivae, white sclerea Neck: supple, no masses, trachea mildline  Cardiovascular: RRR, no murmurs or rubs, no peripheral edema  Respiratory: clear to auscultation bilaterally, no wheezes, no crackles  Abdomen: soft, diffusely tender with voluntary guarding, no rebound tenderness, nondistended, normal bowel tones heard  Skin: dry, no rashes  Musculoskeletal: no joint effusions, normal range of motion  Psychiatric: appropriate affect, normal speech  Neurologic: extraocular muscles intact, clear speech, moving all extremities with intact sensorium          Labs on Admission:  Basic Metabolic  Panel: Recent Labs  Lab 07/07/23 1656  NA 129*  K 4.2  CL 95*  CO2 14*  GLUCOSE 162*  BUN 10  CREATININE 0.98  CALCIUM 9.2   Liver Function Tests: Recent Labs  Lab 07/07/23 1656  AST 23  ALT 17  ALKPHOS 106  BILITOT 0.9  PROT 8.7*  ALBUMIN 4.8   Recent Labs  Lab 07/07/23 1656  LIPASE 54*   No results for input(s): "AMMONIA" in the last 168 hours. CBC: Recent Labs  Lab 07/07/23 1656  WBC 5.4  HGB 14.8  HCT 43.7  MCV 102.1*  PLT 301   Cardiac Enzymes: No results for input(s): "CKTOTAL", "CKMB", "CKMBINDEX", "TROPONINI" in the last 168 hours.  BNP (last 3 results) No results for input(s): "BNP" in the last 8760 hours.  ProBNP (last 3 results) No results for input(s): "PROBNP" in the last 8760 hours.  CBG: No results for input(s): "GLUCAP" in the last 168 hours.  Radiological Exams on Admission: CT ABDOMEN PELVIS W CONTRAST  Result Date: 07/07/2023 CLINICAL DATA:  Right upper quadrant abdominal pain. History of pancreatitis. EXAM: CT ABDOMEN AND PELVIS WITH CONTRAST TECHNIQUE: Multidetector CT imaging of the abdomen and pelvis was performed using the standard protocol following bolus administration of intravenous contrast. RADIATION DOSE REDUCTION: This exam was performed according to the departmental dose-optimization program which includes automated exposure control, adjustment of the mA and/or kV according to patient size and/or use of iterative reconstruction technique. CONTRAST:  85mL OMNIPAQUE IOHEXOL 300 MG/ML  SOLN COMPARISON:  CT abdomen pelvis dated 04/22/2023. FINDINGS: Lower chest: The visualized lung bases are clear. No intra-abdominal free air or free fluid. Hepatobiliary: Fatty liver. No biliary ductal dilatation. Layering stones or sludge in the gallbladder. No pericholecystic fluid or evidence of acute cholecystitis by CT. There has been interval placement of a common bile duct stent. Pancreas: There is diffuse inflammatory changes and edema of the  pancreas consistent with pancreatitis. A pancreatic duct stent is seen. There is abutment of the tail of the pancreas to the greater curvature of the stomach with loss of fat plane consistent with adhesions. A 9 mm fluid density in or adjacent to the tail of the pancreas appears new since the prior CT and may represent a developing small pseudocyst or less likely focal dilatation of the distal pancreatic duct. Spleen: Normal in size without focal abnormality. Adrenals/Urinary Tract: The adrenal glands are unremarkable. The kidneys, visualized ureters, and urinary bladder appear unremarkable. Stomach/Bowel: There is no bowel obstruction or active inflammation. There is bandlike tethering of the splenic flexure to the distal pancreas suggestive of developing adhesions. The appendix is normal. Vascular/Lymphatic: The abdominal aorta and IVC are unremarkable. Upper abdominal varices and venous collaterals noted in the upper abdomen and porta hepatic. No portal venous gas. There is no adenopathy. Reproductive: The prostate and seminal vesicles are grossly unremarkable. No pelvic mass. Other: None Musculoskeletal:  No acute or significant osseous findings. IMPRESSION: 1. Pancreatitis with a 9 mm hypodense focus in the pancreatic tail which may represent a developing pseudocyst or focal dilatation of the distal pancreatic duct. 2. Interval placement of a common bile duct stent and replacement of the pancreatic duct stent. 3. Fatty liver. 4. Cholelithiasis. 5. No bowel obstruction. Normal appendix. Electronically Signed   By: Elgie Collard M.D.   On: 07/07/2023 21:28    Assessment/Plan This is a pleasant 37 year old gentleman with a history of chronic pancreatitis and poorly controlled insulin-dependent type 2 diabetes being admitted to the hospital with concern for recurrent pancreatitis and pseudocyst formation.  Pancreatitis-suspected due to inflammatory changes in the abdomen, abdominal pain, and vomiting.   Lipase not significantly elevated, but pancreas may be "burned-out."  No evidence of acute infection or abscess. -Inpatient admission -N.p.o. -IV fluids -Pain and nausea medications as needed -Discussed with Dr. Barron Alvine, who will consult this morning  Type 2 diabetes-not well-controlled, hemoglobin A1c 8.3 on 07/06/2023.  Lately has been using insulin pump, but does not have it with him.  When off the pump, typically takes Lantus 16 units in the morning. -Lantus 6 units daily while n.p.o. -Sliding scale insulin every 4 hours -Will plan carb controlled diet and increase Lantus once diet is advanced  Hyponatremia-mild and asymptomatic, likely hypovolemic hyponatremia from recent vomiting and reduced oral intake -Anticipate improvement with IV fluids -Monitor with daily labs  DVT prophylaxis: Lovenox     Code Status: Full Code  Consults called: Gastroenterology  Admission status: The appropriate patient status for this patient is INPATIENT. Inpatient status is judged to be reasonable and necessary in order to provide the required intensity of service to ensure the patient's safety. The patient's presenting symptoms, physical exam findings, and initial radiographic and laboratory data in the context of their chronic comorbidities is felt to place them at high risk for further clinical deterioration. Furthermore, it is not anticipated that the patient will be medically stable for discharge from the hospital within 2 midnights of admission.    I certify that at the point of admission it is my clinical judgment that the patient will require inpatient hospital care spanning beyond 2 midnights from the point of admission due to high intensity of service, high risk for further deterioration and high frequency of surveillance required  Time spent: 53 minutes  Ambermarie Honeyman Sharlette Dense MD [facility] Pager [phone]  If 7PM-7AM, please contact night-coverage [email or web address] Password  [facility]  07/08/2023, 7:48 AM

## 2023-07-08 NOTE — Inpatient Diabetes Management (Signed)
Inpatient Diabetes Program Recommendations  AACE/ADA: New Consensus Statement on Inpatient Glycemic Control (2015)  Target Ranges:  Prepandial:   less than 140 mg/dL      Peak postprandial:   less than 180 mg/dL (1-2 hours)      Critically ill patients:  140 - 180 mg/dL   Lab Results  Component Value Date   GLUCAP 147 (H) 07/08/2023   HGBA1C 8.3 (H) 07/06/2023    Review of Glycemic Control  Diabetes history: DM1(does not make insulin. Needs correction, basal and meal coverage)   Outpatient Diabetes medications: OmniPod with Dexcom G7 Basal: Pt receives 12 units of basal insulin within a 24 hour period Target 130 Carb ratio: 1 units of Novolog for every 12 grams of carbohydrates Sensitivity: 1 units of insulin drops glucose 50 points Duration of insulin 4 hours  When insulin pump is off, Lantus 16 units every day, Novolog 5 TID  Current orders for Inpatient glycemic control: Semglee 6 every day, Novolog 0-15 Q4H  Inpatient Diabetes Program Recommendations:    Increase Semglee to 6 units BID  Decrease Novolog to 0-9 units Q4H (Type 1 and sensitive to insulin. 1 units drops insulin 50 points)  Continue to follow.  Thank you. Ailene Ards, RD, LDN, CDCES Inpatient Diabetes Coordinator [phone]

## 2023-07-08 NOTE — Consult Note (Addendum)
Referring Provider: [facility] Primary Care Physician:  Caesar Bookman, NP Primary Gastroenterologist:  Dr. [name]  Reason for Consultation: Chronic pancreatitis, nausea, vomiting, abdominal pain, poor p.o. intake  HPI: Carlos [name] is a 37 y.o. 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  Has chronic pancreatitis related to previous extensive alcohol use although no alcohol use since November 2021.  He presented to the hospital on this occasion with progressive abdominal pain and nausea/vomiting, unable to tolerate solid food, but has been tolerating liquids.  He says that for about a week after his procedure on 06/02/2023 he did well, but since then pain has been progressive.  Labs fairly unremarkable except a low sodium at 129.  CT abdomen and pelvis with contrast yesterday, 8/29:  IMPRESSION: 1. Pancreatitis with a 9 mm hypodense focus in the pancreatic tail which may represent a developing pseudocyst or focal dilatation of the distal pancreatic duct. 2. Interval placement of a common bile duct stent and replacement of the pancreatic duct stent. 3. Fatty liver. 4. Cholelithiasis. 5. No bowel obstruction. Normal appendix.  Most recent GI procedures:  ERCP on 04/28/2023: - A single moderately severe localized biliary stricture was found in the lower third of the main bile duct. The stricture was benign appearing. Brushed for cytology. - The common bile duct was mildly dilated, secondary to the stricture. - One plastic stent was placed into the common bile duct. - Prior pancreatic and biliary sphincterotomies were noted. - Two PD stents in good postion were noted - both appeared clogged.  ERCP on 06/02/2023:  - Prior biliary sphincterotomy appeared open. Biliary stent was in position. This was removed and sent for cytology. -  Prior pancreatic sphincterotomy appeared open. 2 pancreatic stents were in position. These were removed. - A single moderate biliary stricture was found in the lower third of the main bile duct. The stricture was inflammatory in appearance. This was brushed for cytology (previous brushings were negative). - The upper third of the main bile duct and middle third of the main bile duct were mildly dilated, likely due to the inflammatory appearing stricture noted above. - The biliary tree was swept and sludge was found. The gallbladder began to fill on cholangiogram. - Biliary sphincterotomy extension was performed. - One plastic biliary stent was placed into the common bile duct. Had difficulty placing 2 separate biliary stents so just the left one in place. - A filling defect consistent with sludge was seen on the ventral pancreatogram. The pancreatic duct was swept with findings of multiple pancreatic stones. Further pancreatogram did not suggest significant stricturing still present. - One temporary plastic pancreatic stent was placed into the ventral pancreatic duct to decrease post ERCP pancreatitis risk.  Past Medical History:  Diagnosis Date   AKI (acute kidney injury) (HCC) 12/21/2021   Anxiety    Diabetes (HCC)    Pancreatitis    Splenic vein thrombosis     Past Surgical History:  Procedure Laterality Date   BILIARY BRUSHING  02/02/2023   Procedure: BILIARY BRUSHING;  Surgeon: Lemar Lofty., MD;  Location: [facility];  Service: Gastroenterology;;   BILIARY BRUSHING  04/28/2023   Procedure: BILIARY BRUSHING;  Surgeon: Meryl Dare, MD;  Location: [facility] ENDOSCOPY;  Service: Gastroenterology;;   BILIARY BRUSHING  06/02/2023   Procedure: BILIARY BRUSHING;  Surgeon: Lemar Lofty., MD;  Location: [facility]OSCOPY;  Service: Gastroenterology;;   BILIARY DILATION  12/01/2022   Procedure: BILIARY DILATION;  Surgeon: Lemar Lofty., MD;  Location: [facility];  Service:  Gastroenterology;;   BILIARY DILATION  02/02/2023   Procedure: BILIARY DILATION;  Surgeon: Lemar Lofty., MD;  Location: [facility];  Service: Gastroenterology;;   BILIARY STENT PLACEMENT  04/28/2023   Procedure: BILIARY STENT PLACEMENT;  Surgeon: Meryl Dare, MD;  Location: [facility] ENDOSCOPY;  Service: Gastroenterology;;   BILIARY STENT PLACEMENT N/A 06/02/2023   Procedure: BILIARY STENT PLACEMENT;  Surgeon: Lemar Lofty., MD;  Location: [facility];  Service: Gastroenterology;  Laterality: N/A;   BIOPSY  12/02/2021   Procedure: BIOPSY;  Surgeon: [name] [name], MD;  Location: [facility];  Service: Gastroenterology;;   ENDOSCOPIC RETROGRADE CHOLANGIOPANCREATOGRAPHY (ERCP) WITH PROPOFOL N/A 08/12/2022   Procedure: ENDOSCOPIC RETROGRADE CHOLANGIOPANCREATOGRAPHY (ERCP) WITH PROPOFOL;  Surgeon: Lemar Lofty., MD;  Location: [facility];  Service: Gastroenterology;  Laterality: N/A;   ENDOSCOPIC RETROGRADE CHOLANGIOPANCREATOGRAPHY (ERCP) WITH PROPOFOL N/A 12/01/2022   Procedure: ENDOSCOPIC RETROGRADE CHOLANGIOPANCREATOGRAPHY (ERCP) WITH PROPOFOL;  Surgeon: [name] [name], MD;  Location: [facility];  Service: Gastroenterology;  Laterality: N/A;   ENDOSCOPIC RETROGRADE CHOLANGIOPANCREATOGRAPHY (ERCP) WITH PROPOFOL N/A 02/02/2023   Procedure: ENDOSCOPIC RETROGRADE CHOLANGIOPANCREATOGRAPHY (ERCP) WITH PROPOFOL;  Surgeon: [name] [name], MD;  Location: [facility];  Service: Gastroenterology;  Laterality: N/A;   ENDOSCOPIC RETROGRADE CHOLANGIOPANCREATOGRAPHY (ERCP) WITH PROPOFOL N/A 06/02/2023   Procedure: ENDOSCOPIC RETROGRADE CHOLANGIOPANCREATOGRAPHY (ERCP) WITH PROPOFOL;  Surgeon: [name] [name], MD;  Location: [facility];  Service: Gastroenterology;  Laterality: N/A;   ERCP N/A 04/28/2023   Procedure: ENDOSCOPIC RETROGRADE CHOLANGIOPANCREATOGRAPHY (ERCP);  Surgeon: Meryl Dare, MD;  Location: [facility] ENDOSCOPY;  Service:  Gastroenterology;  Laterality: N/A;   ESOPHAGOGASTRODUODENOSCOPY N/A 12/01/2022   Procedure: ESOPHAGOGASTRODUODENOSCOPY (EGD);  Surgeon: Lemar Lofty., MD;  Location: [facility];  Service: Gastroenterology;  Laterality: N/A;   ESOPHAGOGASTRODUODENOSCOPY (EGD) WITH PROPOFOL N/A 12/02/2021   Procedure: ESOPHAGOGASTRODUODENOSCOPY (EGD) WITH PROPOFOL;  Surgeon: [name] [name], MD;  Location: [facility];  Service: Gastroenterology;  Laterality: N/A;   FINE NEEDLE ASPIRATION N/A 12/01/2022   Procedure: FINE NEEDLE ASPIRATION (FNA) LINEAR;  Surgeon: Lemar Lofty., MD;  Location: [facility];  Service: Gastroenterology;  Laterality: N/A;   NEUROLYTIC CELIAC PLEXUS  12/01/2022   Procedure: CELIAC PLEXUS BLOCK;  Surgeon: [name], [name], MD;  Location: [facility];  Service: Gastroenterology;;   NO PAST SURGERIES     PANCREATIC STENT PLACEMENT  08/12/2022   Procedure: PANCREATIC STENT PLACEMENT;  Surgeon: Lemar Lofty., MD;  Location: [facility];  Service: Gastroenterology;;   PANCREATIC STENT PLACEMENT  12/01/2022   Procedure: PANCREATIC STENT PLACEMENT;  Surgeon: Lemar Lofty., MD;  Location: [facility];  Service: Gastroenterology;;   PANCREATIC STENT PLACEMENT  02/02/2023   Procedure: PANCREATIC STENT PLACEMENT;  Surgeon: Lemar Lofty., MD;  Location: [facility];  Service: Gastroenterology;;   PANCREATIC STENT PLACEMENT  06/02/2023   Procedure: PANCREATIC STENT PLACEMENT;  Surgeon: Lemar Lofty., MD;  Location: [facility];  Service: Gastroenterology;;   REMOVAL OF STONES  12/01/2022   Procedure: REMOVAL OF STONES;  Surgeon: Lemar Lofty., MD;  Location: [facility];  Service: Gastroenterology;;   REMOVAL OF STONES  02/02/2023   Procedure: REMOVAL OF STONES;  Surgeon: Lemar Lofty., MD;  Location: [facility];  Service: Gastroenterology;;   REMOVAL OF STONES  06/02/2023   Procedure: REMOVAL OF STONES;   Surgeon: Lemar Lofty., MD;  Location: [facility];  Service:  Gastroenterology;;   [name]  08/12/2022   Procedure: [name];  Surgeon: [name] [name], MD;  Location: [facility];  Service: Gastroenterology;;   [name]  12/01/2022   Procedure: [name];  Surgeon: [name] [name], MD;  Location: [facility];  Service: Gastroenterology;;   [name]  06/02/2023   Procedure: [name];  Surgeon: [name] [name], MD;  Location: [facility];  Service: Gastroenterology;;   Francine Graven REMOVAL  12/01/2022   Procedure: STENT REMOVAL;  Surgeon: Lemar Lofty., MD;  Location: [facility];  Service: Gastroenterology;;   Francine Graven REMOVAL  02/02/2023   Procedure: STENT REMOVAL;  Surgeon: Lemar Lofty., MD;  Location: [facility];  Service: Gastroenterology;;   Francine Graven REMOVAL  06/02/2023   Procedure: STENT REMOVAL;  Surgeon: Lemar Lofty., MD;  Location: [facility];  Service: Gastroenterology;;   UPPER ESOPHAGEAL ENDOSCOPIC ULTRASOUND (EUS) N/A 12/02/2021   Procedure: UPPER ESOPHAGEAL ENDOSCOPIC ULTRASOUND (EUS);  Surgeon: Lemar Lofty., MD;  Location: [facility];  Service: Gastroenterology;  Laterality: N/A;   UPPER ESOPHAGEAL ENDOSCOPIC ULTRASOUND (EUS) N/A 12/01/2022   Procedure: UPPER ESOPHAGEAL ENDOSCOPIC ULTRASOUND (EUS);  Surgeon: Lemar Lofty., MD;  Location: [facility];  Service: Gastroenterology;  Laterality: N/A;    Prior to Admission medications   Medication Sig Start Date End Date Taking? Authorizing Provider  HYDROmorphone (DILAUDID) 4 MG tablet Take 1 tablet (4 mg total) by mouth every 4 (four) hours as needed for pain, max daily dose: 6 Tablet. 06/13/23  Yes   insulin aspart (NOVOLOG) 100 UNIT/ML injection 60 units a day Patient taking differently: Inject 60 Units into the skin daily. 60 units a day 12/14/22  Yes [name], [name], MD  lipase/protease/amylase (CREON)  36000 UNITS CPEP capsule Take 1-2 capsules (36,000-72,000 Units total) by mouth See admin instructions. Take 72,000 units by mouth three times a day before meals and 36,000 units with any snacks 06/23/23  Yes [name], [name], MD  multivitamin (ONE-A-DAY MEN'S) TABS tablet Take 1 tablet by mouth daily with breakfast.   Yes [provider]  naloxone (NARCAN) nasal spray 4 mg/0.1 mL Place 1 spray into the nose once. 04/05/23  Yes [provider]  omeprazole (PRILOSEC) 40 MG capsule TAKE 1 CAPSULE (40 MG TOTAL) BY MOUTH DAILY. 05/03/23  Yes [name], MD  Continuous Blood Gluc Sensor (DEXCOM G6 SENSOR) MISC 1 Device by Other route as directed. Change sensors every 10 days 11/17/22   [name], [name], MD  Continuous Blood Gluc Transmit (DEXCOM G6 TRANSMITTER) MISC 1 Device by Does not apply route as directed. 11/17/22   [name], [name], MD  Insulin Disposable Pump (OMNIPOD 5 G6 POD, GEN 5,) MISC 1 Device by Does not apply route every 3 (three) days. 11/17/22   [name], [name], MD  Insulin Pen Needle 32G X 4 MM MISC 1 Device by Does not apply route daily in the afternoon. 11/17/22   [name], [name], MD  ondansetron (ZOFRAN) 8 MG tablet Take 1 tablet (8 mg total) by mouth every 8 (eight) hours as needed for nausea or vomiting. 04/23/22   [name], MD    Current Facility-Administered Medications  Medication Dose Route Frequency Provider Last Rate Last Admin   0.9 %  sodium chloride infusion   Intravenous Continuous Kirby Crigler, Mir M, MD 100 mL/hr at 07/08/23 0810 New Bag at 07/08/23 0810   albuterol (PROVENTIL) (2.5 MG/3ML) 0.083% nebulizer solution 2.5 mg  2.5 mg Nebulization Q2H PRN Maryln Gottron, MD  enoxaparin (LOVENOX) injection 40 mg  40 mg Subcutaneous Q24H Kirby Crigler, Mir M, MD       HYDROmorphone (DILAUDID) injection 1 mg  1 mg Intravenous Q3H PRN Kirby Crigler, Mir M, MD   1 mg at 07/08/23 0810   HYDROmorphone  (DILAUDID) tablet 4 mg  4 mg Oral Q4H PRN Kirby Crigler, Mir M, MD       insulin aspart (novoLOG) injection 0-15 Units  0-15 Units Subcutaneous Q4H Kirby Crigler, Mir M, MD   5 Units at 07/08/23 6010   insulin glargine-yfgn [facility]) injection 6 Units  6 Units Subcutaneous Daily Kirby Crigler, Mir M, MD       ondansetron [facility]) injection 4 mg  4 mg Intravenous Q6H PRN Maryln Gottron, MD        Allergies as of 07/07/2023 - Review Complete 07/07/2023  Allergen Reaction Noted   Other Anaphylaxis and Other (See Comments) 09/26/2020    Family History  Problem Relation Age of Onset   Hypertension Mother    Alcoholism Maternal Uncle    Diabetes Maternal Grandmother    Diabetes Maternal Grandfather    Diabetes Paternal Grandmother    Diabetes Paternal Grandfather    Colon cancer Neg Hx    Esophageal cancer Neg Hx    Inflammatory bowel disease Neg Hx    Liver disease Neg Hx    Pancreatic cancer Neg Hx    Rectal cancer Neg Hx    Stomach cancer Neg Hx     Social History   Socioeconomic History   Marital status: Single    Spouse name: Not on file   Number of children: 6   Years of education: Not on file   Highest education level: Associate degree: academic program  Occupational History   Occupation: Museum/gallery exhibitions officer  Tobacco Use   Smoking status: Some Days    Types: Cigars    Start date: 11/08/2002   Smokeless tobacco: Never   Tobacco comments:    Once a week.  Vaping Use   Vaping status: Never Used  Substance and Sexual Activity   Alcohol use: Not Currently   Drug use: Never   Sexual activity: Not on file  Other Topics Concern   Not on file  Social History Narrative   Not on file   Social Determinants of Health   Financial Resource Strain: Low Risk  (04/05/2023)   Overall Financial Resource Strain (CARDIA)    Difficulty of Paying Living Expenses: Not hard at all  Food Insecurity: No Food Insecurity (04/23/2023)   Hunger Vital Sign    Worried About Running Out of Food in the  Last Year: Never true    Ran Out of Food in the Last Year: Never true  Transportation Needs: No Transportation Needs (04/23/2023)   PRAPARE - Administrator, Civil Service (Medical): No    Lack of Transportation (Non-Medical): No  Physical Activity: Sufficiently Active (04/05/2023)   Exercise Vital Sign    Days of Exercise per Week: 4 days    Minutes of Exercise per Session: 150+ min  Stress: Stress Concern Present (04/05/2023)   [employer] of Occupational Health - Occupational Stress Questionnaire    Feeling of Stress : To some extent  Social Connections: Moderately Isolated (04/05/2023)   Social Connection and Isolation Panel [NHANES]    Frequency of Communication with Friends and Family: More than three times a week    Frequency of Social Gatherings with Friends and Family: Three times a week    Attends Religious Services:  Never    Active Member of Clubs or Organizations: No    Attends Banker Meetings: Not on file    Marital Status: Married  Intimate Partner Violence: Not At Risk (07/08/2023)   Humiliation, Afraid, Rape, and Kick questionnaire    Fear of Current or Ex-Partner: No    Emotionally Abused: No    Physically Abused: No    Sexually Abused: No    Review of Systems: ROS is O/W negative except as mentioned in HPI.  Physical Exam: Vital signs in last 24 hours: Temp:  [97.5 F (36.4 C)-99.1 F (37.3 C)] 98.1 F (36.7 C) (08/30 0527) Pulse Rate:  [90-111] 96 (08/30 0527) Resp:  [12-18] 17 (08/30 0527) BP: (118-158)/(78-101) 158/99 (08/30 0527) SpO2:  [99 %-100 %] 100 % (08/30 0527) Weight:  [72.6 kg] 72.6 kg (08/29 1651)   General:  Alert, Well-developed, well-nourished, pleasant and cooperative in NAD Head:  Normocephalic and atraumatic. Eyes:  Sclera clear, no icterus.  Conjunctiva pink. Ears:  Normal auditory acuity. Mouth:  No deformity or lesions.   Lungs:  Clear throughout to auscultation.  No wheezes, crackles, or rhonchi.   Heart:  Regular rate and rhythm; no murmurs, clicks, rubs, or gallops. Abdomen:  Soft, non-distended.  BS present.  Mild upper abdominal TTP.   Msk:  Symmetrical without gross deformities. Pulses:  Normal pulses noted. Extremities:  Without clubbing or edema. Neurologic:  Alert and oriented x 4;  grossly normal neurologically. Skin:  Intact without significant lesions or rashes. Psych:  Alert and cooperative. Normal mood and affect.  Intake/Output from previous day: 08/29 0701 - 08/30 0700 In: 2005.7 [IV Piggyback:2005.7] Out: -   Lab Results: Recent Labs    07/07/23 1656  WBC 5.4  HGB 14.8  HCT 43.7  PLT 301   BMET Recent Labs    07/07/23 1656  NA 129*  K 4.2  CL 95*  CO2 14*  GLUCOSE 162*  BUN 10  CREATININE 0.98  CALCIUM 9.2   LFT Recent Labs    07/07/23 1656  PROT 8.7*  ALBUMIN 4.8  AST 23  ALT 17  ALKPHOS 106  BILITOT 0.9   Studies/Results: CT ABDOMEN PELVIS W CONTRAST  Result Date: 07/07/2023 CLINICAL DATA:  Right upper quadrant abdominal pain. History of pancreatitis. EXAM: CT ABDOMEN AND PELVIS WITH CONTRAST TECHNIQUE: Multidetector CT imaging of the abdomen and pelvis was performed using the standard protocol following bolus administration of intravenous contrast. RADIATION DOSE REDUCTION: This exam was performed according to the departmental dose-optimization program which includes automated exposure control, adjustment of the mA and/or kV according to patient size and/or use of iterative reconstruction technique. CONTRAST:  85mL OMNIPAQUE IOHEXOL 300 MG/ML  SOLN COMPARISON:  CT abdomen pelvis dated 04/22/2023. FINDINGS: Lower chest: The visualized lung bases are clear. No intra-abdominal free air or free fluid. Hepatobiliary: Fatty liver. No biliary ductal dilatation. Layering stones or sludge in the gallbladder. No pericholecystic fluid or evidence of acute cholecystitis by CT. There has been interval placement of a common bile duct stent. Pancreas:  There is diffuse inflammatory changes and edema of the pancreas consistent with pancreatitis. A pancreatic duct stent is seen. There is abutment of the tail of the pancreas to the greater curvature of the stomach with loss of fat plane consistent with adhesions. A 9 mm fluid density in or adjacent to the tail of the pancreas appears new since the prior CT and may represent a developing small pseudocyst or less likely focal dilatation of  the distal pancreatic duct. Spleen: Normal in size without focal abnormality. Adrenals/Urinary Tract: The adrenal glands are unremarkable. The kidneys, visualized ureters, and urinary bladder appear unremarkable. Stomach/Bowel: There is no bowel obstruction or active inflammation. There is bandlike tethering of the splenic flexure to the distal pancreas suggestive of developing adhesions. The appendix is normal. Vascular/Lymphatic: The abdominal aorta and IVC are unremarkable. Upper abdominal varices and venous collaterals noted in the upper abdomen and porta hepatic. No portal venous gas. There is no adenopathy. Reproductive: The prostate and seminal vesicles are grossly unremarkable. No pelvic mass. Other: None Musculoskeletal: No acute or significant osseous findings. IMPRESSION: 1. Pancreatitis with a 9 mm hypodense focus in the pancreatic tail which may represent a developing pseudocyst or focal dilatation of the distal pancreatic duct. 2. Interval placement of a common bile duct stent and replacement of the pancreatic duct stent. 3. Fatty liver. 4. Cholelithiasis. 5. No bowel obstruction. Normal appendix. Electronically Signed   By: Elgie Collard M.D.   On: 07/07/2023 21:28    IMPRESSION:  37 year old male with a history of chronic pancreatitis with PD stricture s/p PD stent with history of alcohol use disorder (abstinent from alcohol since 09/2020).  Has had multiple procedures/ERCPs with stents.  His CT scan this admission shows a 9 mm hypodense focus in the pancreatic  tail which may represent a developing pseudocyst or focal dilatation of the distal pancreatic duct.  Common bile duct stent and replacement of pancreatic ducts stent noted.  PLAN: -? Intervention this admission, removal of pancreatic duct stent.  Will discuss with Dr. Marina Goodell. -He has an appointment in the office with Dr. [name] on 9/18. -Otherwise IV hydration, pain control, antiemetics.  Princella Pellegrini. Zehr  07/08/2023, 9:28 AM  GI ATTENDING  History, laboratories, x-rays reviewed.  Prior endoscopy reports reviewed.  Patient seen and examined.  Agree with comprehensive consultation note as outlined above. Patient presents with mild acute on chronic pancreatitis.  No overwhelming changes on CT.  Continue with supportive care and pain management.  Advance diet as tolerated. I am planning on pulling the temporary pancreatic duct stent, as it has not fallen out at, now, 4 weeks.  He is set up for endoscopy in the morning to accomplish this task.The nature of the procedure, as well as the risks, benefits, and alternatives were carefully and thoroughly reviewed with the patient. Ample time for discussion and questions allowed. The patient understood, was satisfied, and agreed to proceed. He does have routine outpatient follow-up with Dr. [name] in a few weeks.  Wilhemina Bonito. Eda Keys., M.D. [facility] Division of Gastroenterology

## 2023-07-09 ENCOUNTER — Inpatient Hospital Stay (HOSPITAL_COMMUNITY): Payer: Medicaid Other | Admitting: Anesthesiology

## 2023-07-09 ENCOUNTER — Encounter (HOSPITAL_COMMUNITY): Payer: Self-pay | Admitting: Internal Medicine

## 2023-07-09 ENCOUNTER — Encounter (HOSPITAL_COMMUNITY)
Admission: EM | Disposition: A | Discharge status: Home / Self Care | Payer: Self-pay | Source: Home / Self Care | Attending: Internal Medicine

## 2023-07-09 DIAGNOSIS — E1010 Type 1 diabetes mellitus with ketoacidosis without coma: Secondary | ICD-10-CM

## 2023-07-09 DIAGNOSIS — T183XXA Foreign body in small intestine, initial encounter: Secondary | ICD-10-CM | POA: Diagnosis not present

## 2023-07-09 DIAGNOSIS — K861 Other chronic pancreatitis: Secondary | ICD-10-CM | POA: Diagnosis not present

## 2023-07-09 DIAGNOSIS — F419 Anxiety disorder, unspecified: Secondary | ICD-10-CM

## 2023-07-09 DIAGNOSIS — E1065 Type 1 diabetes mellitus with hyperglycemia: Secondary | ICD-10-CM

## 2023-07-09 DIAGNOSIS — T85898A Other specified complication of other internal prosthetic devices, implants and grafts, initial encounter: Secondary | ICD-10-CM

## 2023-07-09 DIAGNOSIS — T189XXA Foreign body of alimentary tract, part unspecified, initial encounter: Secondary | ICD-10-CM

## 2023-07-09 HISTORY — PX: STENT REMOVAL: SHX6421

## 2023-07-09 HISTORY — PX: ESOPHAGOGASTRODUODENOSCOPY (EGD) WITH PROPOFOL: SHX5813

## 2023-07-09 LAB — GLUCOSE, CAPILLARY
Glucose-Capillary: 131 mg/dL — ABNORMAL HIGH (ref 70–99)
Glucose-Capillary: 147 mg/dL — ABNORMAL HIGH (ref 70–99)
Glucose-Capillary: 148 mg/dL — ABNORMAL HIGH (ref 70–99)
Glucose-Capillary: 167 mg/dL — ABNORMAL HIGH (ref 70–99)
Glucose-Capillary: 168 mg/dL — ABNORMAL HIGH (ref 70–99)
Glucose-Capillary: 200 mg/dL — ABNORMAL HIGH (ref 70–99)
Glucose-Capillary: 239 mg/dL — ABNORMAL HIGH (ref 70–99)

## 2023-07-09 SURGERY — ESOPHAGOGASTRODUODENOSCOPY (EGD) WITH PROPOFOL
Anesthesia: Monitor Anesthesia Care

## 2023-07-09 MED ORDER — HYDROMORPHONE HCL 4 MG PO TABS
4.0000 mg | ORAL_TABLET | ORAL | 0 refills | Status: AC | PRN
Start: 2023-07-09 — End: 2023-07-14

## 2023-07-09 MED ORDER — PROPOFOL 10 MG/ML IV BOLUS
INTRAVENOUS | Status: AC
  Filled 2023-07-09: qty 20

## 2023-07-09 MED ORDER — SODIUM CHLORIDE 0.9 % IV SOLN: INTRAVENOUS | Status: DC

## 2023-07-09 MED ORDER — PROPOFOL 10 MG/ML IV BOLUS
INTRAVENOUS | Status: DC | PRN
  Administered 2023-07-09 (×2): 50 mg via INTRAVENOUS
  Administered 2023-07-09: 100 mg via INTRAVENOUS
  Administered 2023-07-09 (×2): 50 mg via INTRAVENOUS

## 2023-07-09 MED ORDER — PROPOFOL 1000 MG/100ML IV EMUL
INTRAVENOUS | Status: AC
  Filled 2023-07-09: qty 100

## 2023-07-09 MED ORDER — LIDOCAINE HCL (CARDIAC) PF 100 MG/5ML IV SOSY
PREFILLED_SYRINGE | INTRAVENOUS | Status: DC | PRN
  Administered 2023-07-09: 100 mg via INTRAVENOUS

## 2023-07-09 MED ORDER — PROPOFOL 500 MG/50ML IV EMUL
INTRAVENOUS | Status: DC | PRN
  Administered 2023-07-09: 150 ug/kg/min via INTRAVENOUS

## 2023-07-09 MED ORDER — DEXMEDETOMIDINE HCL IN NACL 80 MCG/20ML IV SOLN
INTRAVENOUS | Status: DC | PRN
Start: 2023-07-09 — End: 2023-07-09
  Administered 2023-07-09: 4 ug via INTRAVENOUS
  Administered 2023-07-09: 8 ug via INTRAVENOUS

## 2023-07-09 SURGICAL SUPPLY — 15 items

## 2023-07-09 NOTE — Plan of Care (Signed)
  Problem: Education: Goal: Knowledge of General Education information will improve Description Including pain rating scale, medication(s)/side effects and non-pharmacologic comfort measures Outcome: Progressing   Problem: Pain Managment: Goal: General experience of comfort will improve Outcome: Progressing   Problem: Health Behavior/Discharge Planning: Goal: Ability to manage health-related needs will improve Outcome: Progressing

## 2023-07-09 NOTE — Anesthesia Postprocedure Evaluation (Signed)
Anesthesia Post Note  Patient: Carlos [name]  Procedure(s) Performed: ESOPHAGOGASTRODUODENOSCOPY (EGD) WITH PROPOFOL STENT REMOVAL     Patient location during evaluation: PACU Anesthesia Type: MAC Level of consciousness: awake and alert Pain management: pain level controlled Vital Signs Assessment: post-procedure vital signs reviewed and stable Respiratory status: spontaneous breathing, nonlabored ventilation and respiratory function stable Cardiovascular status: stable and blood pressure returned to baseline Anesthetic complications: no   No notable events documented.  Last Vitals:  Vitals:   07/09/23 1025 07/09/23 1339  BP:  (!) 147/99  Pulse: 98 95  Resp: 12 16  Temp:  36.8 C  SpO2: 93% 100%    Last Pain:  Vitals:   07/09/23 1339  TempSrc: Oral  PainSc:                  Beryle Lathe

## 2023-07-09 NOTE — Interval H&P Note (Signed)
History and Physical Interval Note:  07/09/2023 9:33 AM  Carlos [name]  has presented today for surgery, with the diagnosis of Pancreatic duct stent removal.  The various methods of treatment have been discussed with the patient and family. After consideration of risks, benefits and other options for treatment, the patient has consented to  Procedure(s) with comments: ESOPHAGOGASTRODUODENOSCOPY (EGD) WITH PROPOFOL (N/A) - needs ercp scope for stent pull as a surgical intervention.  The patient's history has been reviewed, patient examined, no change in status, stable for surgery.  I have reviewed the patient's chart and labs.  Questions were answered to the patient's satisfaction.     [name]

## 2023-07-09 NOTE — Op Note (Signed)
[facility] Patient Name: Carlos [name] Procedure Date: 07/09/2023 MRN: [id number] Attending MD: Wilhemina Bonito. Marina Goodell , MD, [id number] Date of Birth: [birth year] CSN: [id number] Age: 37 Admit Type: Inpatient Procedure:                Upper GI endoscopy with removal of foreign body                            (pancreatic duct stent) Indications:              Foreign body in the small bowel/major ampulla/PD Providers:                Wilhemina Bonito. Marina Goodell, MD, Eliberto Ivory, RN, Rozetta Nunnery,                            Technician Referring MD:             [facility] Medicines:                Monitored Anesthesia Care Complications:            No immediate complications. Estimated Blood Loss:     Estimated blood loss: none. Procedure:                Pre-Anesthesia Assessment:                           - Prior to the procedure, a History and Physical                            was performed, and patient medications and                            allergies were reviewed. The patient's tolerance of                            previous anesthesia was also reviewed. The risks                            and benefits of the procedure and the sedation                            options and risks were discussed with the patient.                            All questions were answered, and informed consent                            was obtained. Prior Anticoagulants: The patient has                            taken no anticoagulant or antiplatelet agents. ASA                            Grade Assessment: III - A patient with severe  systemic disease. After reviewing the risks and                            benefits, the patient was deemed in satisfactory                            condition to undergo the procedure.                           After obtaining informed consent, the endoscope was                            passed under direct vision. Throughout the                             procedure, the patient's blood pressure, pulse, and                            oxygen saturations were monitored continuously. The                            [id number]-Q190V ([id number]) Olympus duodenoscope was                            introduced through the mouth, and advanced to the                            second part of duodenum. The upper GI endoscopy was                            accomplished without difficulty. The patient                            tolerated the procedure well. Scope In: 9:45:54 AM Scope Out: 9:59:30 AM Total Procedure Duration: 0 hours 13 minutes 36 seconds  Findings:      The side-viewing endoscope was passed blindly into the esophagus which       was not examined.      The stomach was grossly normal.      The examined duodenum was normal with the exception of 2 stents       emanating from the major ampulla. The distal end of the pancreatic duct       stent was embedded in the biliary stent flap. The pancreatic duct stent       was selectively removed with rat-tooth forceps. The biliary stent       remained in good position.      The cardia and gastric fundus were normal on retroflexion. Impression:               1. Selective removal of pancreatic duct stent                           2. Biliary stent remains in place                           3. Otherwise unremarkable  exam with side-viewing                            endoscope. Moderate Sedation:      none Recommendation:           1. Patient will return to the ward                           2. Continue with supportive care regarding                            management of his pancreatitis and pancreatic pain.                           3. Advance diet as tolerated. I will try him on low                            fat diet.                           4. Okay for discharge home when p.o. intake                            acceptable and pain control reasonable                           5. He has a  follow-up appointment with Dr.                            [name] on September 18. He should keep that                            appointment.                           GI will sign off, but are available for questions.                            The findings were reviewed with the patient as well                            as the plan. He was given a copy of this report. Procedure Code(s):        --- Professional ---                           [phone], Esophagogastroduodenoscopy, flexible,                            transoral; diagnostic, including collection of                            specimen(s) by brushing or washing, when performed                            (separate procedure) Diagnosis Code(s):        ---  Professional ---                           Z61.0RUE, Foreign body in small intestine, initial                            encounter CPT copyright 2022 [employer]. All rights reserved. The codes documented in this report are preliminary and upon coder review may  be revised to meet current compliance requirements. Wilhemina Bonito. Marina Goodell, MD 07/09/2023 10:16:11 AM This report has been signed electronically. Number of Addenda: 0

## 2023-07-09 NOTE — Transfer of Care (Signed)
Immediate Anesthesia Transfer of Care Note  Patient: Carlos [name]  Procedure(s) Performed: ESOPHAGOGASTRODUODENOSCOPY (EGD) WITH PROPOFOL STENT REMOVAL  Patient Location: PACU  Anesthesia Type:General  Level of Consciousness: awake and patient cooperative  Airway & Oxygen Therapy: Patient Spontanous Breathing  Post-op Assessment: Report given to RN and Post -op Vital signs reviewed and stable  Post vital signs: Reviewed and stable  Last Vitals:  Vitals Value Taken Time  BP 126/90 07/09/23 1015  Temp 36.3 C 07/09/23 1009  Pulse 95 07/09/23 1015  Resp 17 07/09/23 1015  SpO2 100 % 07/09/23 1015  Vitals shown include unfiled device data.  Last Pain:  Vitals:   07/09/23 0922  TempSrc: Temporal  PainSc: 9       Patients Stated Pain Goal: 3 (07/09/23 0615)  Complications: No notable events documented.

## 2023-07-09 NOTE — Plan of Care (Signed)
  Problem: Pain Managment: Goal: General experience of comfort will improve Outcome: Progressing   Problem: Coping: Goal: Level of anxiety will decrease Outcome: Progressing   

## 2023-07-09 NOTE — Anesthesia Preprocedure Evaluation (Addendum)
Anesthesia Evaluation  Patient identified by MRN, date of birth, ID band Patient awake    Reviewed: Allergy & Precautions, NPO status , Patient's Chart, lab work & pertinent test results  History of Anesthesia Complications Negative for: history of anesthetic complications  Airway Mallampati: II  TM Distance: >3 FB Neck ROM: Full    Dental  (+) Dental Advisory Given   Pulmonary Current Smoker and Patient abstained from smoking.   Pulmonary exam normal        Cardiovascular negative cardio ROS Normal cardiovascular exam     Neuro/Psych  PSYCHIATRIC DISORDERS Anxiety     negative neurological ROS     GI/Hepatic ,GERD  Medicated,,(+)     substance abuse  alcohol use  Endo/Other  diabetes, Insulin Dependent   Na 129 (baseline low 130's)    Renal/GU negative Renal ROS     Musculoskeletal negative musculoskeletal ROS (+)    Abdominal   Peds  Hematology negative hematology ROS (+)   Anesthesia Other Findings   Reproductive/Obstetrics                             Anesthesia Physical Anesthesia Plan  ASA: 3  Anesthesia Plan: MAC   Post-op Pain Management: Minimal or no pain anticipated   Induction:   PONV Risk Score and Plan: 0 and Propofol infusion and Treatment may vary due to age or medical condition  Airway Management Planned: Nasal Cannula and Natural Airway  Additional Equipment: None  Intra-op Plan:   Post-operative Plan:   Informed Consent: I have reviewed the patients History and Physical, chart, labs and discussed the procedure including the risks, benefits and alternatives for the proposed anesthesia with the patient or authorized representative who has indicated his/her understanding and acceptance.       Plan Discussed with: CRNA and Anesthesiologist  Anesthesia Plan Comments:        Anesthesia Quick Evaluation

## 2023-07-09 NOTE — Progress Notes (Signed)
PROGRESS NOTE    Carlos [name]  WJX:[id number]  DOB: May 27, 1986  DOA: 07/07/2023 PCP: Carlos Bookman, NP Outpatient Specialists:   Hospital course:  37 year old man with DM 2, chronic pancreatitis, last alcohol use 2021, complicated by PD strictures, pseudocyst formation and splenic vein thrombosis, s/p multiple ERCPs with pancreatic duct stenting was admitted with progressive abdominal pain, nausea and vomiting for the past 2 to 3 weeks.  Workup reveals 9 mm hypodense focus in the pancreatic tail which may be a developing pseudocyst versus a focal dilation of the distal pancreatic duct.  Patient seen by GI who recommended removal of pancreatic duct stent.   Subjective:  Patient is anticipating removal of his pancreatic duct stent today.  He has no new complaints.  Notes abdominal pain is better controlled.   Objective: Vitals:   07/09/23 1009 07/09/23 1010 07/09/23 1025 07/09/23 1339  BP:  117/75  (!) 147/99  Pulse: 100 (!) 101 98 95  Resp:  12 12 16   Temp: (!) 97.3 F (36.3 C)   98.2 F (36.8 C)  TempSrc:    Oral  SpO2: 100% 100% 93% 100%  Weight:      Height:        Intake/Output Summary (Last 24 hours) at 07/09/2023 1558 Last data filed at 07/09/2023 1339 Gross per 24 hour  Intake 2153.04 ml  Output --  Net 2153.04 ml   Filed Weights   07/07/23 1651 07/09/23 0922  Weight: 72.6 kg 72.6 kg     Exam:  General: Thin man looking much older than stated age lying in bed with attentive family at bedside. Eyes: sclera anicteric, conjuctiva mild injection bilaterally CVS: S1-S2, regular  Respiratory:  decreased air entry bilaterally secondary to decreased inspiratory effort, rales at bases  GI: Thin, decreased BS, minimal diffuse tenderness without rebound LE: Warm and well-perfused Neuro: A/O x 3,  grossly nonfocal.  Psych: patient is logical and coherent, judgement and insight appear normal, mood and affect appropriate to situation.  Data Reviewed:  Basic  Metabolic Panel: Recent Labs  Lab 07/07/23 1656  NA 129*  K 4.2  CL 95*  CO2 14*  GLUCOSE 162*  BUN 10  CREATININE 0.98  CALCIUM 9.2    CBC: Recent Labs  Lab 07/07/23 1656  WBC 5.4  HGB 14.8  HCT 43.7  MCV 102.1*  PLT 301     Scheduled Meds:  enoxaparin (LOVENOX) injection  40 mg Subcutaneous Q24H   insulin aspart  0-15 Units Subcutaneous Q4H   insulin glargine-yfgn  6 Units Subcutaneous Daily   Continuous Infusions:  sodium chloride 100 mL/hr at 07/09/23 0617     Assessment & Plan:   Chronic pancreatitis Nausea and vomiting 9 mm hypodense focus pancreatic tail/possible pseudocyst formation Hyponatremia Plan is for pancreatic duct stent removal today Patient remains n.p.o., no further nausea and vomiting Patient has been maintained on normal saline at 100 cc an hour Repeat BMP in a.m. to ensure improvement of previously noted hyponatremia  DM 2 Blood sugars under reasonable control on present regimen Can restart his insulin pump once he is able to take p.o.'s.    DVT prophylaxis: Lovenox Code Status: Full Family Communication: SO was at bedside    Studies: CT ABDOMEN PELVIS W CONTRAST  Result Date: 07/07/2023 CLINICAL DATA:  Right upper quadrant abdominal pain. History of pancreatitis. EXAM: CT ABDOMEN AND PELVIS WITH CONTRAST TECHNIQUE: Multidetector CT imaging of the abdomen and pelvis was performed using the standard protocol following bolus administration  of intravenous contrast. RADIATION DOSE REDUCTION: This exam was performed according to the departmental dose-optimization program which includes automated exposure control, adjustment of the mA and/or kV according to patient size and/or use of iterative reconstruction technique. CONTRAST:  85mL OMNIPAQUE IOHEXOL 300 MG/ML  SOLN COMPARISON:  CT abdomen pelvis dated 04/22/2023. FINDINGS: Lower chest: The visualized lung bases are clear. No intra-abdominal free air or free fluid. Hepatobiliary: Fatty  liver. No biliary ductal dilatation. Layering stones or sludge in the gallbladder. No pericholecystic fluid or evidence of acute cholecystitis by CT. There has been interval placement of a common bile duct stent. Pancreas: There is diffuse inflammatory changes and edema of the pancreas consistent with pancreatitis. A pancreatic duct stent is seen. There is abutment of the tail of the pancreas to the greater curvature of the stomach with loss of fat plane consistent with adhesions. A 9 mm fluid density in or adjacent to the tail of the pancreas appears new since the prior CT and may represent a developing small pseudocyst or less likely focal dilatation of the distal pancreatic duct. Spleen: Normal in size without focal abnormality. Adrenals/Urinary Tract: The adrenal glands are unremarkable. The kidneys, visualized ureters, and urinary bladder appear unremarkable. Stomach/Bowel: There is no bowel obstruction or active inflammation. There is bandlike tethering of the splenic flexure to the distal pancreas suggestive of developing adhesions. The appendix is normal. Vascular/Lymphatic: The abdominal aorta and IVC are unremarkable. Upper abdominal varices and venous collaterals noted in the upper abdomen and porta hepatic. No portal venous gas. There is no adenopathy. Reproductive: The prostate and seminal vesicles are grossly unremarkable. No pelvic mass. Other: None Musculoskeletal: No acute or significant osseous findings. IMPRESSION: 1. Pancreatitis with a 9 mm hypodense focus in the pancreatic tail which may represent a developing pseudocyst or focal dilatation of the distal pancreatic duct. 2. Interval placement of a common bile duct stent and replacement of the pancreatic duct stent. 3. Fatty liver. 4. Cholelithiasis. 5. No bowel obstruction. Normal appendix. Electronically Signed   By: Elgie Collard M.D.   On: 07/07/2023 21:28    Active Problems:   Acute pancreatitis   Pancreatitis   Occlusion of  pancreatic duct stent   Foreign body in alimentary tract     [name] Orma Flaming, [facility]  If 7PM-7AM, please contact night-coverage [email or web address]   LOS: 1 day

## 2023-07-09 NOTE — Plan of Care (Signed)
  Problem: Education: Goal: Knowledge of General Education information will improve Description: Including pain rating scale, medication(s)/side effects and non-pharmacologic comfort measures Outcome: Progressing   Problem: Health Behavior/Discharge Planning: Goal: Ability to manage health-related needs will improve Outcome: Adequate for Discharge   Problem: Clinical Measurements: Goal: Ability to maintain clinical measurements within normal limits will improve Outcome: Progressing Goal: Will remain free from infection Outcome: Progressing Goal: Diagnostic test results will improve Outcome: Progressing Goal: Respiratory complications will improve Outcome: Progressing Goal: Cardiovascular complication will be avoided Outcome: Progressing   Problem: Activity: Goal: Risk for activity intolerance will decrease Outcome: Completed/Met   Problem: Nutrition: Goal: Adequate nutrition will be maintained Outcome: Progressing   Problem: Coping: Goal: Level of anxiety will decrease Outcome: Progressing   Problem: Elimination: Goal: Will not experience complications related to bowel motility Outcome: Completed/Met Goal: Will not experience complications related to urinary retention Outcome: Completed/Met   Problem: Pain Managment: Goal: General experience of comfort will improve Outcome: Progressing   Problem: Safety: Goal: Ability to remain free from injury will improve Outcome: Progressing   Problem: Skin Integrity: Goal: Risk for impaired skin integrity will decrease Outcome: Completed/Met

## 2023-07-10 DIAGNOSIS — K859 Acute pancreatitis without necrosis or infection, unspecified: Secondary | ICD-10-CM | POA: Diagnosis not present

## 2023-07-10 DIAGNOSIS — G8929 Other chronic pain: Secondary | ICD-10-CM | POA: Diagnosis not present

## 2023-07-10 DIAGNOSIS — K860 Alcohol-induced chronic pancreatitis: Secondary | ICD-10-CM | POA: Diagnosis not present

## 2023-07-10 DIAGNOSIS — R1013 Epigastric pain: Secondary | ICD-10-CM | POA: Diagnosis not present

## 2023-07-10 LAB — GLUCOSE, CAPILLARY
Glucose-Capillary: 117 mg/dL — ABNORMAL HIGH (ref 70–99)
Glucose-Capillary: 134 mg/dL — ABNORMAL HIGH (ref 70–99)
Glucose-Capillary: 197 mg/dL — ABNORMAL HIGH (ref 70–99)
Glucose-Capillary: 230 mg/dL — ABNORMAL HIGH (ref 70–99)

## 2023-07-10 LAB — BASIC METABOLIC PANEL
Anion gap: 10 (ref 5–15)
BUN: 7 mg/dL (ref 6–20)
CO2: 22 mmol/L (ref 22–32)
Calcium: 8.7 mg/dL — ABNORMAL LOW (ref 8.9–10.3)
Chloride: 100 mmol/L (ref 98–111)
Creatinine, Ser: 0.72 mg/dL (ref 0.61–1.24)
GFR, Estimated: >60 mL/min (ref >60)
Glucose, Bld: 110 mg/dL — ABNORMAL HIGH (ref 70–99)
Potassium: 3.2 mmol/L — ABNORMAL LOW (ref 3.5–5.1)
Sodium: 132 mmol/L — ABNORMAL LOW (ref 135–145)

## 2023-07-10 LAB — LIPASE, BLOOD: Lipase: 30 U/L (ref 11–51)

## 2023-07-10 MED ORDER — POTASSIUM CHLORIDE 20 MEQ PO PACK
40.0000 meq | PACK | Freq: Once | ORAL | Status: AC
  Administered 2023-07-10: 40 meq via ORAL
  Filled 2023-07-10: qty 2

## 2023-07-10 NOTE — Discharge Summary (Signed)
Carlos [name] UJW:[id number] DOB: [birth year] DOA: 07/07/2023  PCP: Caesar Bookman, NP  Admit date: 07/07/2023  Discharge date: 07/10/2023  Admitted From: Home   disposition: Home   Recommendations for Outpatient Follow-up:   Follow up with Dr. [name] as already planned on July 27, 2023   Home Health: N/A Equipment/Devices: N/A Consultations: Gastroenterology Discharge Condition: Imroved CODE STATUS: Full Diet Recommendation: Low-fat  Diet Order             Diet Heart Room service appropriate? Yes; Fluid consistency: Thin  Diet effective now                    Chief Complaint  Patient presents with   Abdominal Pain     Brief history of present illness from the day of admission and additional interim summary    Carlos [name] is a 37 y.o. 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  Has chronic pancreatitis related to previous extensive alcohol use although no alcohol use since November 2021.  He presented to the hospital on this occasion with progressive abdominal pain and nausea/vomiting, unable to tolerate solid food, but has been tolerating liquids.  He says that for about a week after his procedure on 06/02/2023 he did well, but since then pain has been progressive.                                                                   Hospital Course    Patient was seen by gastroenterology and underwent EGD where previous pancreatic stent was removed.  Patient felt mediately improved.  The following day, patient was eating well without difficulty, no further abdominal pain nausea or vomiting.  Labs were notable for a potassium of 3.2,  patient was given oral potassium replacement..  Patient was requesting to go home and with agreement from gastroenterology, patient was discharged home to follow-up with them as already planned July 27, 2023.  Discharge diagnosis     Active Problems:   Acute pancreatitis   Pancreatitis   Occlusion of pancreatic duct stent   Foreign body in alimentary tract    Discharge instructions      Discharge Medications   Allergies as of 07/10/2023       Reactions   Other Anaphylaxis, Other (See Comments)   [name] - anaphylaxis        Medication List     TAKE these medications    Dexcom G6 Sensor Misc 1 Device by Other route as directed. Change sensors every 10 days   Dexcom G6 Transmitter Misc 1 Device by Does not apply route as directed.   HYDROmorphone 4 MG tablet Commonly known as:  DILAUDID Take 1 tablet (4 mg total) by mouth every 4 (four) hours as needed for pain, max daily dose: 6 Tablet. What changed: Another medication with the same name was added. Make sure you understand how and when to take each.   HYDROmorphone 4 MG tablet Commonly known as: Dilaudid Take 1 tablet (4 mg total) by mouth every 4 (four) hours as needed for up to 5 days for severe pain. What changed: You were already taking a medication with the same name, and this prescription was added. Make sure you understand how and when to take each.   insulin aspart 100 UNIT/ML injection Commonly known as: novoLOG 60 units a day What changed:  how much to take how to take this when to take this   Insulin Pen Needle 32G X 4 MM Misc 1 Device by Does not apply route daily in the afternoon.   lipase/protease/amylase 16109 UNITS Cpep capsule Commonly known as: Creon Take 1-2 capsules (36,000-72,000 Units total) by mouth See admin instructions. Take 72,000 units by mouth three times a day before meals and 36,000 units with any snacks   multivitamin Tabs tablet Take 1 tablet by mouth daily with  breakfast.   naloxone 4 MG/0.1ML Liqd nasal spray kit Commonly known as: NARCAN Place 1 spray into the nose once.   omeprazole 40 MG capsule Commonly known as: PRILOSEC TAKE 1 CAPSULE (40 MG TOTAL) BY MOUTH DAILY.   Omnipod 5 G6 Pods (Gen 5) Misc 1 Device by Does not apply route every 3 (three) days.   ondansetron 8 MG tablet Commonly known as: Zofran Take 1 tablet (8 mg total) by mouth every 8 (eight) hours as needed for nausea or vomiting.          Major procedures and Radiology Reports - PLEASE review detailed and final reports thoroughly  -      CT ABDOMEN PELVIS W CONTRAST  Result Date: 07/07/2023 CLINICAL DATA:  Right upper quadrant abdominal pain. History of pancreatitis. EXAM: CT ABDOMEN AND PELVIS WITH CONTRAST TECHNIQUE: Multidetector CT imaging of the abdomen and pelvis was performed using the standard protocol following bolus administration of intravenous contrast. RADIATION DOSE REDUCTION: This exam was performed according to the departmental dose-optimization program which includes automated exposure control, adjustment of the mA and/or kV according to patient size and/or use of iterative reconstruction technique. CONTRAST:  85mL OMNIPAQUE IOHEXOL 300 MG/ML  SOLN COMPARISON:  CT abdomen pelvis dated 04/22/2023. FINDINGS: Lower chest: The visualized lung bases are clear. No intra-abdominal free air or free fluid. Hepatobiliary: Fatty liver. No biliary ductal dilatation. Layering stones or sludge in the gallbladder. No pericholecystic fluid or evidence of acute cholecystitis by CT. There has been interval placement of a common bile duct stent. Pancreas: There is diffuse inflammatory changes and edema of the pancreas consistent with pancreatitis. A pancreatic duct stent is seen. There is abutment of the tail of the pancreas to the greater curvature of the stomach with loss of fat plane consistent with adhesions. A 9 mm fluid density in or adjacent to the tail of the pancreas  appears new since the prior CT and may represent a developing small pseudocyst or less likely focal dilatation of the distal pancreatic duct. Spleen: Normal in size without focal abnormality. Adrenals/Urinary Tract: The adrenal glands are unremarkable. The kidneys, visualized ureters, and urinary bladder appear unremarkable. Stomach/Bowel: There is no bowel obstruction or active inflammation. There is bandlike tethering of the splenic flexure to the distal pancreas suggestive of developing adhesions.  The appendix is normal. Vascular/Lymphatic: The abdominal aorta and IVC are unremarkable. Upper abdominal varices and venous collaterals noted in the upper abdomen and porta hepatic. No portal venous gas. There is no adenopathy. Reproductive: The prostate and seminal vesicles are grossly unremarkable. No pelvic mass. Other: None Musculoskeletal: No acute or significant osseous findings. IMPRESSION: 1. Pancreatitis with a 9 mm hypodense focus in the pancreatic tail which may represent a developing pseudocyst or focal dilatation of the distal pancreatic duct. 2. Interval placement of a common bile duct stent and replacement of the pancreatic duct stent. 3. Fatty liver. 4. Cholelithiasis. 5. No bowel obstruction. Normal appendix. Electronically Signed   By: Elgie Collard M.D.   On: 07/07/2023 21:28    Micro Results    No results found for this or any previous visit (from the past 240 hour(s)).  Today   Subjective    Carlos [name] feels much improved since admission.  Feels ready to go home.  Denies chest pain, shortness of breath or abdominal pain.  Feels they can take care of themselves with the resources they have at home.  Objective   Blood pressure 114/82, pulse 90, temperature 98.2 F (36.8 C), temperature source Oral, resp. rate 16, height 6\' 5"  (1.956 m), weight 72.6 kg, SpO2 100%.   Intake/Output Summary (Last 24 hours) at 07/10/2023 1738 Last data filed at 07/09/2023 2200 Gross per 24 hour   Intake 614.4 ml  Output --  Net 614.4 ml    Exam General: Patient appears well, standing up chatting with his significant other questing to go home, patient in good spirits Eyes: sclera anicteric, conjuctiva mild injection bilaterally CVS: S1-S2, regular  Respiratory:  decreased air entry bilaterally secondary to decreased inspiratory effort, rales at bases  GI: NABS, soft, NT  LE: No edema.  Neuro: A/O x 3, Moving all extremities equally with normal strength, CN 3-12 intact, grossly nonfocal.  Psych: patient is logical and coherent, judgement and insight appear normal, mood and affect appropriate to situation.    Data Review   CBC w Diff:  Lab Results  Component Value Date   WBC 5.4 07/07/2023   HGB 14.8 07/07/2023   HCT 43.7 07/07/2023   PLT 301 07/07/2023   LYMPHOPCT 12 04/22/2023   MONOPCT 14 04/22/2023   EOSPCT 3 04/22/2023   BASOPCT 1 04/22/2023    CMP:  Lab Results  Component Value Date   NA 132 (L) 07/10/2023   K 3.2 (L) 07/10/2023   CL 100 07/10/2023   CO2 22 07/10/2023   BUN 7 07/10/2023   CREATININE 0.72 07/10/2023   CREATININE 0.89 08/02/2022   PROT 8.7 (H) 07/07/2023   ALBUMIN 4.8 07/07/2023   BILITOT 0.9 07/07/2023   ALKPHOS 106 07/07/2023   AST 23 07/07/2023   ALT 17 07/07/2023  .   Total Time in preparing paper work, data evaluation and todays exam - 35 minutes  Pieter Partridge M.D on 07/10/2023 at 5:38 PM  [facility]

## 2023-07-10 NOTE — Progress Notes (Signed)
HISTORY OF PRESENT ILLNESS:  Carlos [name] is a 37 y.o. male with recurrent acute on chronic pancreatitis secondary to alcohol.  Multiple endoscopic interventions with Dr. [name].  Admitted with pain and pancreatitis.  Previous pancreatic stent remained after 4 weeks.  I remove this yesterday.  Patient states that he is feeling better.  Back to his baseline.  Eating okay.  Procedure findings reviewed..  Male companion in room.  REVIEW OF SYSTEMS:  All non-GI ROS negative. Past Medical History:  Diagnosis Date   AKI (acute kidney injury) (HCC) 12/21/2021   Anxiety    Diabetes (HCC)    Pancreatitis    Splenic vein thrombosis     Past Surgical History:  Procedure Laterality Date   BILIARY BRUSHING  02/02/2023   Procedure: BILIARY BRUSHING;  Surgeon: [name] [name], MD;  Location: [facility];  Service: Gastroenterology;;   BILIARY BRUSHING  04/28/2023   Procedure: BILIARY BRUSHING;  Surgeon: Meryl Dare, MD;  Location: [facility] ENDOSCOPY;  Service: Gastroenterology;;   BILIARY BRUSHING  06/02/2023   Procedure: BILIARY BRUSHING;  Surgeon: Lemar Lofty., MD;  Location: [facility];  Service: Gastroenterology;;   BILIARY DILATION  12/01/2022   Procedure: BILIARY DILATION;  Surgeon: Lemar Lofty., MD;  Location: [facility]OS[facility];  Service: Gastroenterology;;   BILIARY DILATION  02/02/2023   Procedure: BILIARY DILATION;  Surgeon: Lemar Lofty., MD;  Location: [facility];  Service: Gastroenterology;;   BILIARY STENT PLACEMENT  04/28/2023   Procedure: BILIARY STENT PLACEMENT;  Surgeon: Meryl Dare, MD;  Location: [facility] ENDOSCOPY;  Service: Gastroenterology;;   BILIARY STENT PLACEMENT N/A 06/02/2023   Procedure: BILIARY STENT PLACEMENT;  Surgeon: Lemar Lofty., MD;  Location: [facility] ENDOSCOPY;  Service: Gastroenterology;  Laterality: N/A;   BIOPSY  12/02/2021   Procedure: BIOPSY;  Surgeon: [name] [name], MD;  Location: [facility] ENDOSCOPY;   Service: Gastroenterology;;   ENDOSCOPIC RETROGRADE CHOLANGIOPANCREATOGRAPHY (ERCP) WITH PROPOFOL N/A 08/12/2022   Procedure: ENDOSCOPIC RETROGRADE CHOLANGIOPANCREATOGRAPHY (ERCP) WITH PROPOFOL;  Surgeon: Lemar Lofty., MD;  Location: [facility] ENDOSCOPY;  Service: Gastroenterology;  Laterality: N/A;   ENDOSCOPIC RETROGRADE CHOLANGIOPANCREATOGRAPHY (ERCP) WITH PROPOFOL N/A 12/01/2022   Procedure: ENDOSCOPIC RETROGRADE CHOLANGIOPANCREATOGRAPHY (ERCP) WITH PROPOFOL;  Surgeon: [name] [name], MD;  Location: [facility] ENDOSCOPY;  Service: Gastroenterology;  Laterality: N/A;   ENDOSCOPIC RETROGRADE CHOLANGIOPANCREATOGRAPHY (ERCP) WITH PROPOFOL N/A 02/02/2023   Procedure: ENDOSCOPIC RETROGRADE CHOLANGIOPANCREATOGRAPHY (ERCP) WITH PROPOFOL;  Surgeon: [name] [name], MD;  Location: [facility] ENDOSCOPY;  Service: Gastroenterology;  Laterality: N/A;   ENDOSCOPIC RETROGRADE CHOLANGIOPANCREATOGRAPHY (ERCP) WITH PROPOFOL N/A 06/02/2023   Procedure: ENDOSCOPIC RETROGRADE CHOLANGIOPANCREATOGRAPHY (ERCP) WITH PROPOFOL;  Surgeon: [name] [name], MD;  Location: [facility] ENDOSCOPY;  Service: Gastroenterology;  Laterality: N/A;   ERCP N/A 04/28/2023   Procedure: ENDOSCOPIC RETROGRADE CHOLANGIOPANCREATOGRAPHY (ERCP);  Surgeon: Meryl Dare, MD;  Location: [facility] ENDOSCOPY;  Service: Gastroenterology;  Laterality: N/A;   ESOPHAGOGASTRODUODENOSCOPY N/A 12/01/2022   Procedure: ESOPHAGOGASTRODUODENOSCOPY (EGD);  Surgeon: Lemar Lofty., MD;  Location: [facility];  Service: Gastroenterology;  Laterality: N/A;   ESOPHAGOGASTRODUODENOSCOPY (EGD) WITH PROPOFOL N/A 12/02/2021   Procedure: ESOPHAGOGASTRODUODENOSCOPY (EGD) WITH PROPOFOL;  Surgeon: [name] [name], MD;  Location: [facility] ENDOSCOPY;  Service: Gastroenterology;  Laterality: N/A;   FINE NEEDLE ASPIRATION N/A 12/01/2022   Procedure: FINE NEEDLE ASPIRATION (FNA) LINEAR;  Surgeon: Lemar Lofty., MD;  Location: [facility] ENDOSCOPY;  Service:  Gastroenterology;  Laterality: N/A;   NEUROLYTIC CELIAC PLEXUS  12/01/2022   Procedure: CELIAC PLEXUS BLOCK;  Surgeon: [name] [name], MD;  Location:  [facility] ENDOSCOPY;  Service: Gastroenterology;;   PANCREATIC STENT PLACEMENT  08/12/2022   Procedure: PANCREATIC STENT PLACEMENT;  Surgeon: [name] [name], MD;  Location: [facility];  Service: Gastroenterology;;   PANCREATIC STENT PLACEMENT  12/01/2022   Procedure: PANCREATIC STENT PLACEMENT;  Surgeon: Lemar Lofty., MD;  Location: [facility];  Service: Gastroenterology;;   PANCREATIC STENT PLACEMENT  02/02/2023   Procedure: PANCREATIC STENT PLACEMENT;  Surgeon: Lemar Lofty., MD;  Location: [facility];  Service: Gastroenterology;;   PANCREATIC STENT PLACEMENT  06/02/2023   Procedure: PANCREATIC STENT PLACEMENT;  Surgeon: Lemar Lofty., MD;  Location: [facility];  Service: Gastroenterology;;   REMOVAL OF STONES  12/01/2022   Procedure: REMOVAL OF STONES;  Surgeon: Lemar Lofty., MD;  Location: [facility];  Service: Gastroenterology;;   REMOVAL OF STONES  02/02/2023   Procedure: REMOVAL OF STONES;  Surgeon: Lemar Lofty., MD;  Location: [facility];  Service: Gastroenterology;;   REMOVAL OF STONES  06/02/2023   Procedure: REMOVAL OF STONES;  Surgeon: Lemar Lofty., MD;  Location: [facility];  Service: Gastroenterology;;   [name]  08/12/2022   Procedure: [name];  Surgeon: Lemar Lofty., MD;  Location: [facility];  Service: Gastroenterology;;   [name]  12/01/2022   Procedure: [name];  Surgeon: Lemar Lofty., MD;  Location: [facility];  Service: Gastroenterology;;   [name]  06/02/2023   Procedure: [name];  Surgeon: Lemar Lofty., MD;  Location: [facility];  Service: Gastroenterology;;   Francine Graven REMOVAL  12/01/2022   Procedure: STENT REMOVAL;  Surgeon: Lemar Lofty., MD;   Location: [facility];  Service: Gastroenterology;;   Francine Graven REMOVAL  02/02/2023   Procedure: STENT REMOVAL;  Surgeon: Lemar Lofty., MD;  Location: [facility];  Service: Gastroenterology;;   Francine Graven REMOVAL  06/02/2023   Procedure: STENT REMOVAL;  Surgeon: Lemar Lofty., MD;  Location: [facility] ENDOSCOPY;  Service: Gastroenterology;;   UPPER ESOPHAGEAL ENDOSCOPIC ULTRASOUND (EUS) N/A 12/02/2021   Procedure: UPPER ESOPHAGEAL ENDOSCOPIC ULTRASOUND (EUS);  Surgeon: Lemar Lofty., MD;  Location: [facility];  Service: Gastroenterology;  Laterality: N/A;   UPPER ESOPHAGEAL ENDOSCOPIC ULTRASOUND (EUS) N/A 12/01/2022   Procedure: UPPER ESOPHAGEAL ENDOSCOPIC ULTRASOUND (EUS);  Surgeon: Lemar Lofty., MD;  Location: [facility];  Service: Gastroenterology;  Laterality: N/A;    Social History Carlos [name]  reports that he has been smoking cigars. He started smoking about 20 years ago. He has never used smokeless tobacco. He reports that he does not currently use alcohol. He reports that he does not use drugs.  family history includes Alcoholism in his maternal uncle; Diabetes in his maternal grandfather, maternal grandmother, paternal grandfather, and paternal grandmother; Hypertension in his mother.  Allergies  Allergen Reactions   Other Anaphylaxis and Other (See Comments)    [name] - anaphylaxis       PHYSICAL EXAMINATION: Vital signs: BP (!) 134/99 (BP Location: Right Arm)   Pulse 87   Temp 98.3 F (36.8 C) (Oral)   Resp 17   Ht 6\' 5"  (1.956 m)   Wt 72.6 kg   SpO2 100%   BMI 18.97 kg/m  General: Thin male,  no acute distress HEENT: Sclerae are anicteric, conjunctiva pink. Oral mucosa intact Lungs: Clear Heart: Regular Abdomen: soft, nontender, nondistended, no obvious ascites, no peritoneal signs, normal bowel sounds. No organomegaly. Extremities: No edema Psychiatric: alert and oriented x3. Cooperative     ASSESSMENT:  1.  Acute on  chronic pancreatitis.  Improved 2.  Status post pancreatic  stent removal 3.  Chronic pain   PLAN:  1.  Continue low-fat diet 2.  Appears ready for discharge 3.  Keep follow-up with Dr. [name] as planned July 27, 2023.  Will sign off.  Wilhemina Bonito. Eda Keys., M.D. [facility] Division of Gastroenterology

## 2023-07-11 ENCOUNTER — Encounter (HOSPITAL_COMMUNITY): Payer: Self-pay | Admitting: Internal Medicine

## 2023-07-12 ENCOUNTER — Telehealth: Payer: Medicaid Other

## 2023-07-12 NOTE — Transitions of Care (Post Inpatient/ED Visit) (Signed)
   07/12/2023  Name: Carlos [name] MRN: [id number] DOB: 08-22-1986  Today's TOC FU Call Status: Today's TOC FU Call Status:: Unsuccessful Call (1st Attempt) Unsuccessful Call (1st Attempt) Date: 07/12/23  Attempted to reach the patient regarding the most recent Inpatient/ED visit. Called patient and no answer. Voicemail was left with office call back number.    Follow Up Plan: Additional outreach attempts will be made to reach the patient to complete the Transitions of Care (Post Inpatient/ED visit) call.   Signature: Fumie Fiallo.D/RMA

## 2023-07-13 ENCOUNTER — Other Ambulatory Visit (HOSPITAL_BASED_OUTPATIENT_CLINIC_OR_DEPARTMENT_OTHER): Payer: Self-pay

## 2023-07-13 MED ORDER — HYDROMORPHONE HCL 4 MG PO TABS
4.0000 mg | ORAL_TABLET | ORAL | 0 refills | Status: DC | PRN
Start: 2023-07-13 — End: 2024-01-05
  Filled 2023-07-13: qty 180, 30d supply, fill #0
  Filled 2023-07-18: qty 161, 27d supply, fill #0

## 2023-07-13 NOTE — Transitions of Care (Post Inpatient/ED Visit) (Signed)
   07/13/2023  Name: Carlos [name] MRN: [id number] DOB: 07-26-1986  Today's TOC FU Call Status: Today's TOC FU Call Status:: Unsuccessful Call (2nd Attempt) Unsuccessful Call (1st Attempt) Date: 07/12/23 Unsuccessful Call (2nd Attempt) Date: 07/13/23  Attempted to reach the patient regarding the most recent Inpatient/ED visit. Patient voicemail box is full, unable to leave voicemail.   Follow Up Plan: Additional outreach attempts will be made to reach the patient to complete the Transitions of Care (Post Inpatient/ED visit) call.   Signature : Shareeka Yim.D/RMA

## 2023-07-15 NOTE — Transitions of Care (Post Inpatient/ED Visit) (Signed)
   07/15/2023  Name: Carlos [name] MRN: [id number] DOB: [birth year]  Today's TOC FU Call Status: Today's TOC FU Call Status:: Successful TOC FU Call Completed Unsuccessful Call (1st Attempt) Date: 07/12/23 Unsuccessful Call (2nd Attempt) Date: 07/13/23 [facility] FU Call Complete Date: 07/15/23 Patient's Name and Date of Birth confirmed.  Transition Care Management Follow-up Telephone Call Date of Discharge: 07/10/23 Discharge Facility: [facility]) Type of Discharge: Emergency Department Reason for ED Visit: Endocrine  Items Reviewed:    Medications Reviewed Today: Medications Reviewed Today   Medications were not reviewed in this encounter     Home Care and Equipment/Supplies:    Functional Questionnaire:    Follow up appointments reviewed:   Patient refused TOC appointment and states that he already has upcoming appointment 11/07/2023.   SIGNATURE: Korri Ask.D/RMA

## 2023-07-18 ENCOUNTER — Other Ambulatory Visit (HOSPITAL_BASED_OUTPATIENT_CLINIC_OR_DEPARTMENT_OTHER): Payer: Self-pay

## 2023-07-27 ENCOUNTER — Ambulatory Visit: Payer: Medicaid Other | Admitting: Gastroenterology

## 2023-08-03 ENCOUNTER — Telehealth: Payer: Self-pay | Admitting: Internal Medicine

## 2023-08-03 MED ORDER — OMNIPOD 5 DEXG7G6 PODS GEN 5 MISC: 1.0000 | 1 refills | Status: DC

## 2023-08-03 MED ORDER — DEXCOM G6 TRANSMITTER MISC: 1.0000 | 1 refills | Status: DC

## 2023-08-03 MED ORDER — DEXCOM G6 SENSOR MISC: 1.0000 | 0 refills | Status: DC

## 2023-08-03 NOTE — Telephone Encounter (Signed)
Done

## 2023-08-03 NOTE — Telephone Encounter (Signed)
MEDICATION: 1)  Dexcom G6 Sensor Continuous Blood Gluc Sensor (DEXCOM G6 SENSOR) MISC  2) excom G6 Transmitter Continuous Blood Gluc Transmit (DEXCOM G6 TRANSMITTER) MISC  3)  Omnipod 5 G6 Pods (Gen 5) Insulin Disposable Pump (OMNIPOD 5 G6 POD, GEN 5,) MISC  PHARMACY:    [id number] - [place], [place] - [address] (Ph: [phone])    HAS THE PATIENT CONTACTED THEIR PHARMACY?  Yes  IS THIS A 90 DAY SUPPLY : Yes  IS PATIENT OUT OF MEDICATION: Yes (Sensors)  IF NOT; HOW MUCH IS LEFT:   LAST APPOINTMENT DATE: @1 /10/24  NEXT APPOINTMENT DATE:@11 /09/2023  DO WE HAVE YOUR PERMISSION TO LEAVE A DETAILED MESSAGE?: Yes  OTHER COMMENTS:    **Let patient know to contact pharmacy at the end of the day to make sure medication is ready. **  ** Please notify patient to allow 48-72 hours to process**  **Encourage patient to contact the pharmacy for refills or they can request refills through [facility]**

## 2023-08-06 DIAGNOSIS — K860 Alcohol-induced chronic pancreatitis: Secondary | ICD-10-CM | POA: Diagnosis present

## 2023-08-06 DIAGNOSIS — E1010 Type 1 diabetes mellitus with ketoacidosis without coma: Principal | ICD-10-CM | POA: Diagnosis present

## 2023-08-06 DIAGNOSIS — E871 Hypo-osmolality and hyponatremia: Secondary | ICD-10-CM | POA: Diagnosis present

## 2023-08-06 DIAGNOSIS — Z833 Family history of diabetes mellitus: Secondary | ICD-10-CM

## 2023-08-06 DIAGNOSIS — E876 Hypokalemia: Secondary | ICD-10-CM | POA: Diagnosis not present

## 2023-08-06 DIAGNOSIS — R000 Tachycardia, unspecified: Secondary | ICD-10-CM | POA: Diagnosis present

## 2023-08-06 DIAGNOSIS — Z794 Long term (current) use of insulin: Secondary | ICD-10-CM

## 2023-08-06 DIAGNOSIS — Z8249 Family history of ischemic heart disease and other diseases of the circulatory system: Secondary | ICD-10-CM

## 2023-08-06 DIAGNOSIS — Z91148 Patient's other noncompliance with medication regimen for other reason: Secondary | ICD-10-CM

## 2023-08-06 DIAGNOSIS — E43 Unspecified severe protein-calorie malnutrition: Secondary | ICD-10-CM | POA: Diagnosis present

## 2023-08-06 DIAGNOSIS — Z86718 Personal history of other venous thrombosis and embolism: Secondary | ICD-10-CM

## 2023-08-06 DIAGNOSIS — Z9641 Presence of insulin pump (external) (internal): Secondary | ICD-10-CM | POA: Diagnosis present

## 2023-08-06 DIAGNOSIS — F1120 Opioid dependence, uncomplicated: Secondary | ICD-10-CM | POA: Diagnosis present

## 2023-08-06 DIAGNOSIS — E861 Hypovolemia: Secondary | ICD-10-CM | POA: Diagnosis not present

## 2023-08-06 DIAGNOSIS — G8929 Other chronic pain: Secondary | ICD-10-CM | POA: Diagnosis present

## 2023-08-06 DIAGNOSIS — E875 Hyperkalemia: Secondary | ICD-10-CM | POA: Diagnosis not present

## 2023-08-06 DIAGNOSIS — F17290 Nicotine dependence, other tobacco product, uncomplicated: Secondary | ICD-10-CM | POA: Diagnosis present

## 2023-08-06 DIAGNOSIS — F419 Anxiety disorder, unspecified: Secondary | ICD-10-CM | POA: Diagnosis present

## 2023-08-06 DIAGNOSIS — K852 Alcohol induced acute pancreatitis without necrosis or infection: Secondary | ICD-10-CM | POA: Diagnosis present

## 2023-08-06 DIAGNOSIS — R030 Elevated blood-pressure reading, without diagnosis of hypertension: Secondary | ICD-10-CM | POA: Diagnosis present

## 2023-08-06 DIAGNOSIS — Z79899 Other long term (current) drug therapy: Secondary | ICD-10-CM

## 2023-08-06 DIAGNOSIS — Z681 Body mass index (BMI) 19 or less, adult: Secondary | ICD-10-CM

## 2023-08-06 DIAGNOSIS — K2100 Gastro-esophageal reflux disease with esophagitis, without bleeding: Secondary | ICD-10-CM | POA: Diagnosis present

## 2023-08-07 ENCOUNTER — Emergency Department (HOSPITAL_BASED_OUTPATIENT_CLINIC_OR_DEPARTMENT_OTHER): Payer: Medicaid Other

## 2023-08-07 ENCOUNTER — Inpatient Hospital Stay (HOSPITAL_BASED_OUTPATIENT_CLINIC_OR_DEPARTMENT_OTHER)
Admission: EM | Admit: 2023-08-07 | Discharge: 2023-08-10 | DRG: 637 | Disposition: A | Discharge status: Home / Self Care | Payer: Medicaid Other | Attending: Internal Medicine | Admitting: Internal Medicine

## 2023-08-07 DIAGNOSIS — E1065 Type 1 diabetes mellitus with hyperglycemia: Secondary | ICD-10-CM | POA: Diagnosis present

## 2023-08-07 DIAGNOSIS — Z833 Family history of diabetes mellitus: Secondary | ICD-10-CM | POA: Diagnosis not present

## 2023-08-07 DIAGNOSIS — K859 Acute pancreatitis without necrosis or infection, unspecified: Secondary | ICD-10-CM | POA: Diagnosis not present

## 2023-08-07 DIAGNOSIS — Z8249 Family history of ischemic heart disease and other diseases of the circulatory system: Secondary | ICD-10-CM | POA: Diagnosis not present

## 2023-08-07 DIAGNOSIS — E43 Unspecified severe protein-calorie malnutrition: Secondary | ICD-10-CM | POA: Diagnosis present

## 2023-08-07 DIAGNOSIS — E876 Hypokalemia: Secondary | ICD-10-CM | POA: Diagnosis not present

## 2023-08-07 DIAGNOSIS — R000 Tachycardia, unspecified: Secondary | ICD-10-CM | POA: Diagnosis present

## 2023-08-07 DIAGNOSIS — K561 Intussusception: Secondary | ICD-10-CM | POA: Diagnosis not present

## 2023-08-07 DIAGNOSIS — F1120 Opioid dependence, uncomplicated: Secondary | ICD-10-CM | POA: Diagnosis present

## 2023-08-07 DIAGNOSIS — E875 Hyperkalemia: Secondary | ICD-10-CM | POA: Diagnosis not present

## 2023-08-07 DIAGNOSIS — Z91148 Patient's other noncompliance with medication regimen for other reason: Secondary | ICD-10-CM | POA: Diagnosis not present

## 2023-08-07 DIAGNOSIS — Z86718 Personal history of other venous thrombosis and embolism: Secondary | ICD-10-CM | POA: Diagnosis not present

## 2023-08-07 DIAGNOSIS — E1310 Other specified diabetes mellitus with ketoacidosis without coma: Principal | ICD-10-CM

## 2023-08-07 DIAGNOSIS — E1110 Type 2 diabetes mellitus with ketoacidosis without coma: Secondary | ICD-10-CM | POA: Diagnosis present

## 2023-08-07 DIAGNOSIS — Z9889 Other specified postprocedural states: Secondary | ICD-10-CM | POA: Diagnosis not present

## 2023-08-07 DIAGNOSIS — K861 Other chronic pancreatitis: Secondary | ICD-10-CM | POA: Diagnosis not present

## 2023-08-07 DIAGNOSIS — F17290 Nicotine dependence, other tobacco product, uncomplicated: Secondary | ICD-10-CM | POA: Diagnosis present

## 2023-08-07 DIAGNOSIS — R030 Elevated blood-pressure reading, without diagnosis of hypertension: Secondary | ICD-10-CM | POA: Diagnosis present

## 2023-08-07 DIAGNOSIS — F419 Anxiety disorder, unspecified: Secondary | ICD-10-CM | POA: Diagnosis present

## 2023-08-07 DIAGNOSIS — K8500 Idiopathic acute pancreatitis without necrosis or infection: Secondary | ICD-10-CM | POA: Diagnosis not present

## 2023-08-07 DIAGNOSIS — E109 Type 1 diabetes mellitus without complications: Secondary | ICD-10-CM

## 2023-08-07 DIAGNOSIS — Z794 Long term (current) use of insulin: Secondary | ICD-10-CM | POA: Diagnosis not present

## 2023-08-07 DIAGNOSIS — K831 Obstruction of bile duct: Secondary | ICD-10-CM | POA: Diagnosis not present

## 2023-08-07 DIAGNOSIS — G8929 Other chronic pain: Secondary | ICD-10-CM | POA: Diagnosis present

## 2023-08-07 DIAGNOSIS — R1013 Epigastric pain: Secondary | ICD-10-CM | POA: Diagnosis not present

## 2023-08-07 DIAGNOSIS — R1084 Generalized abdominal pain: Secondary | ICD-10-CM | POA: Diagnosis not present

## 2023-08-07 DIAGNOSIS — Z9641 Presence of insulin pump (external) (internal): Secondary | ICD-10-CM | POA: Diagnosis present

## 2023-08-07 DIAGNOSIS — E1010 Type 1 diabetes mellitus with ketoacidosis without coma: Secondary | ICD-10-CM | POA: Diagnosis present

## 2023-08-07 DIAGNOSIS — K2100 Gastro-esophageal reflux disease with esophagitis, without bleeding: Secondary | ICD-10-CM | POA: Diagnosis present

## 2023-08-07 DIAGNOSIS — Z681 Body mass index (BMI) 19 or less, adult: Secondary | ICD-10-CM | POA: Diagnosis not present

## 2023-08-07 DIAGNOSIS — E871 Hypo-osmolality and hyponatremia: Secondary | ICD-10-CM | POA: Diagnosis present

## 2023-08-07 DIAGNOSIS — K852 Alcohol induced acute pancreatitis without necrosis or infection: Secondary | ICD-10-CM | POA: Diagnosis present

## 2023-08-07 DIAGNOSIS — E861 Hypovolemia: Secondary | ICD-10-CM | POA: Diagnosis not present

## 2023-08-07 DIAGNOSIS — K860 Alcohol-induced chronic pancreatitis: Secondary | ICD-10-CM | POA: Diagnosis present

## 2023-08-07 DIAGNOSIS — Z79899 Other long term (current) drug therapy: Secondary | ICD-10-CM | POA: Diagnosis not present

## 2023-08-07 LAB — I-STAT VENOUS BLOOD GAS, ED
Acid-base deficit: 22 mmol/L — ABNORMAL HIGH (ref 0.0–2.0)
Bicarbonate: 6.7 mmol/L — ABNORMAL LOW (ref 20.0–28.0)
Calcium, Ion: 1.28 mmol/L (ref 1.15–1.40)
HCT: 45 % (ref 39.0–52.0)
Hemoglobin: 15.3 g/dL (ref 13.0–17.0)
O2 Saturation: 35 %
Patient temperature: 98.8
Potassium: 5.1 mmol/L (ref 3.5–5.1)
Sodium: 129 mmol/L — ABNORMAL LOW (ref 135–145)
TCO2: 7 mmol/L — ABNORMAL LOW (ref 22–32)
pCO2, Ven: 23.5 mm[Hg] — ABNORMAL LOW (ref 44–60)
pH, Ven: 7.064 — CL (ref 7.25–7.43)
pO2, Ven: 29 mm[Hg] — CL (ref 32–45)

## 2023-08-07 LAB — COMPREHENSIVE METABOLIC PANEL
ALT: 34 U/L (ref 0–44)
AST: 21 U/L (ref 15–41)
Albumin: 5.3 g/dL — ABNORMAL HIGH (ref 3.5–5.0)
Alkaline Phosphatase: 373 U/L — ABNORMAL HIGH (ref 38–126)
BUN: 8 mg/dL (ref 6–20)
CO2: <7 mmol/L — ABNORMAL LOW (ref 22–32)
Calcium: 9.7 mg/dL (ref 8.9–10.3)
Chloride: 92 mmol/L — ABNORMAL LOW (ref 98–111)
Creatinine, Ser: 1.19 mg/dL (ref 0.61–1.24)
GFR, Estimated: >60 mL/min (ref >60)
Glucose, Bld: 334 mg/dL — ABNORMAL HIGH (ref 70–99)
Potassium: 4.5 mmol/L (ref 3.5–5.1)
Sodium: 130 mmol/L — ABNORMAL LOW (ref 135–145)
Total Bilirubin: 0.7 mg/dL (ref 0.3–1.2)
Total Protein: 9 g/dL — ABNORMAL HIGH (ref 6.5–8.1)

## 2023-08-07 LAB — URINALYSIS, ROUTINE W REFLEX MICROSCOPIC
Bacteria, UA: NONE SEEN
Bilirubin Urine: NEGATIVE
Glucose, UA: >1000 mg/dL — AB
Ketones, ur: >80 mg/dL — AB
Leukocytes,Ua: NEGATIVE
Nitrite: NEGATIVE
Protein, ur: >300 mg/dL — AB
Specific Gravity, Urine: 1.029 (ref 1.005–1.030)
pH: 5.5 (ref 5.0–8.0)

## 2023-08-07 LAB — BETA-HYDROXYBUTYRIC ACID
Beta-Hydroxybutyric Acid: >8 mmol/L — ABNORMAL HIGH (ref 0.05–0.27)
Beta-Hydroxybutyric Acid: 6.84 mmol/L — ABNORMAL HIGH (ref 0.05–0.27)
Beta-Hydroxybutyric Acid: 1.76 mmol/L — ABNORMAL HIGH (ref 0.05–0.27)

## 2023-08-07 LAB — BASIC METABOLIC PANEL
Anion gap: 20 — ABNORMAL HIGH (ref 5–15)
Anion gap: 17 — ABNORMAL HIGH (ref 5–15)
Anion gap: 11 (ref 5–15)
Anion gap: 9 (ref 5–15)
BUN: 8 mg/dL (ref 6–20)
BUN: 7 mg/dL (ref 6–20)
BUN: 6 mg/dL (ref 6–20)
BUN: 6 mg/dL (ref 6–20)
CO2: 12 mmol/L — ABNORMAL LOW (ref 22–32)
CO2: 12 mmol/L — ABNORMAL LOW (ref 22–32)
CO2: 17 mmol/L — ABNORMAL LOW (ref 22–32)
CO2: 19 mmol/L — ABNORMAL LOW (ref 22–32)
Calcium: 9.6 mg/dL (ref 8.9–10.3)
Calcium: 9.4 mg/dL (ref 8.9–10.3)
Calcium: 9.1 mg/dL (ref 8.9–10.3)
Calcium: 9.1 mg/dL (ref 8.9–10.3)
Chloride: 102 mmol/L (ref 98–111)
Chloride: 100 mmol/L (ref 98–111)
Chloride: 100 mmol/L (ref 98–111)
Chloride: 99 mmol/L (ref 98–111)
Creatinine, Ser: 0.95 mg/dL (ref 0.61–1.24)
Creatinine, Ser: 0.96 mg/dL (ref 0.61–1.24)
Creatinine, Ser: 0.75 mg/dL (ref 0.61–1.24)
Creatinine, Ser: 0.68 mg/dL (ref 0.61–1.24)
GFR, Estimated: >60 mL/min (ref >60)
GFR, Estimated: >60 mL/min (ref >60)
GFR, Estimated: >60 mL/min (ref >60)
GFR, Estimated: >60 mL/min (ref >60)
Glucose, Bld: 83 mg/dL (ref 70–99)
Glucose, Bld: 184 mg/dL — ABNORMAL HIGH (ref 70–99)
Glucose, Bld: 189 mg/dL — ABNORMAL HIGH (ref 70–99)
Glucose, Bld: 158 mg/dL — ABNORMAL HIGH (ref 70–99)
Potassium: 4.1 mmol/L (ref 3.5–5.1)
Potassium: 4 mmol/L (ref 3.5–5.1)
Potassium: 3.4 mmol/L — ABNORMAL LOW (ref 3.5–5.1)
Potassium: 3.5 mmol/L (ref 3.5–5.1)
Sodium: 134 mmol/L — ABNORMAL LOW (ref 135–145)
Sodium: 129 mmol/L — ABNORMAL LOW (ref 135–145)
Sodium: 128 mmol/L — ABNORMAL LOW (ref 135–145)
Sodium: 127 mmol/L — ABNORMAL LOW (ref 135–145)

## 2023-08-07 LAB — CBC
HCT: 44.1 % (ref 39.0–52.0)
Hemoglobin: 14.7 g/dL (ref 13.0–17.0)
MCH: 36.3 pg — ABNORMAL HIGH (ref 26.0–34.0)
MCHC: 33.3 g/dL (ref 30.0–36.0)
MCV: 108.9 fL — ABNORMAL HIGH (ref 80.0–100.0)
Platelets: 371 10*3/uL (ref 150–400)
RBC: 4.05 MIL/uL — ABNORMAL LOW (ref 4.22–5.81)
RDW: 11.9 % (ref 11.5–15.5)
WBC: 9.6 10*3/uL (ref 4.0–10.5)
nRBC: 0 % (ref 0.0–0.2)

## 2023-08-07 LAB — LIPASE, BLOOD: Lipase: 80 U/L — ABNORMAL HIGH (ref 11–51)

## 2023-08-07 LAB — GLUCOSE, CAPILLARY
Glucose-Capillary: 150 mg/dL — ABNORMAL HIGH (ref 70–99)
Glucose-Capillary: 172 mg/dL — ABNORMAL HIGH (ref 70–99)
Glucose-Capillary: 174 mg/dL — ABNORMAL HIGH (ref 70–99)
Glucose-Capillary: 177 mg/dL — ABNORMAL HIGH (ref 70–99)
Glucose-Capillary: 177 mg/dL — ABNORMAL HIGH (ref 70–99)
Glucose-Capillary: 177 mg/dL — ABNORMAL HIGH (ref 70–99)
Glucose-Capillary: 181 mg/dL — ABNORMAL HIGH (ref 70–99)
Glucose-Capillary: 185 mg/dL — ABNORMAL HIGH (ref 70–99)
Glucose-Capillary: 186 mg/dL — ABNORMAL HIGH (ref 70–99)
Glucose-Capillary: 187 mg/dL — ABNORMAL HIGH (ref 70–99)
Glucose-Capillary: 200 mg/dL — ABNORMAL HIGH (ref 70–99)

## 2023-08-07 LAB — CBG MONITORING, ED: Glucose-Capillary: 285 mg/dL — ABNORMAL HIGH (ref 70–99)

## 2023-08-07 LAB — MRSA NEXT GEN BY PCR, NASAL: MRSA by PCR Next Gen: NOT DETECTED

## 2023-08-07 MED ORDER — POTASSIUM CHLORIDE 10 MEQ/100ML IV SOLN
10.0000 meq | INTRAVENOUS | Status: AC
  Administered 2023-08-07 (×2): 10 meq via INTRAVENOUS
  Filled 2023-08-07: qty 100

## 2023-08-07 MED ORDER — LACTATED RINGERS IV SOLN: INTRAVENOUS | Status: DC

## 2023-08-07 MED ORDER — HYDROMORPHONE HCL 1 MG/ML IJ SOLN
1.0000 mg | Freq: Once | INTRAMUSCULAR | Status: AC
  Administered 2023-08-07: 1 mg via INTRAVENOUS
  Filled 2023-08-07: qty 1

## 2023-08-07 MED ORDER — ONDANSETRON HCL 4 MG/2ML IJ SOLN: 4.0000 mg | Freq: Four times a day (QID) | INTRAMUSCULAR | Status: DC | PRN

## 2023-08-07 MED ORDER — HYDRALAZINE HCL 20 MG/ML IJ SOLN
5.0000 mg | Freq: Four times a day (QID) | INTRAMUSCULAR | Status: DC | PRN
  Administered 2023-08-07: 5 mg via INTRAVENOUS
  Filled 2023-08-07: qty 1

## 2023-08-07 MED ORDER — MORPHINE SULFATE (PF) 4 MG/ML IV SOLN
4.0000 mg | Freq: Once | INTRAVENOUS | Status: AC
  Administered 2023-08-07: 4 mg via INTRAVENOUS
  Filled 2023-08-07: qty 1

## 2023-08-07 MED ORDER — IOHEXOL 300 MG/ML  SOLN
100.0000 mL | Freq: Once | INTRAMUSCULAR | Status: AC | PRN
  Administered 2023-08-07: 100 mL via INTRAVENOUS

## 2023-08-07 MED ORDER — DEXTROSE IN LACTATED RINGERS 5 % IV SOLN: INTRAVENOUS | Status: DC

## 2023-08-07 MED ORDER — CHLORHEXIDINE GLUCONATE CLOTH 2 % EX PADS
6.0000 | MEDICATED_PAD | Freq: Every day | CUTANEOUS | Status: DC
  Administered 2023-08-07: 6 via TOPICAL
  Filled 2023-08-07: qty 6

## 2023-08-07 MED ORDER — HYDROMORPHONE HCL 1 MG/ML IJ SOLN
2.0000 mg | INTRAMUSCULAR | Status: DC | PRN
  Administered 2023-08-07 (×3): 2 mg via INTRAVENOUS
  Filled 2023-08-07 (×3): qty 2

## 2023-08-07 MED ORDER — POTASSIUM CHLORIDE CRYS ER 20 MEQ PO TBCR
40.0000 meq | EXTENDED_RELEASE_TABLET | Freq: Once | ORAL | Status: AC
  Administered 2023-08-07: 40 meq via ORAL
  Filled 2023-08-07: qty 2

## 2023-08-07 MED ORDER — LACTATED RINGERS IV BOLUS
20.0000 mL/kg | Freq: Once | INTRAVENOUS | Status: AC
  Administered 2023-08-07: 1452 mL via INTRAVENOUS

## 2023-08-07 MED ORDER — HYDROMORPHONE HCL 2 MG PO TABS
4.0000 mg | ORAL_TABLET | ORAL | Status: DC | PRN
  Administered 2023-08-07 – 2023-08-09 (×7): 4 mg via ORAL
  Filled 2023-08-07 (×9): qty 2

## 2023-08-07 MED ORDER — HYDROMORPHONE HCL 1 MG/ML IJ SOLN
1.0000 mg | INTRAMUSCULAR | Status: DC | PRN
  Administered 2023-08-07: 1 mg via INTRAVENOUS
  Filled 2023-08-07: qty 1

## 2023-08-07 MED ORDER — ONDANSETRON HCL 4 MG/2ML IJ SOLN
4.0000 mg | Freq: Once | INTRAMUSCULAR | Status: AC
  Administered 2023-08-07: 4 mg via INTRAVENOUS
  Filled 2023-08-07: qty 2

## 2023-08-07 MED ORDER — PANTOPRAZOLE SODIUM 40 MG PO TBEC
40.0000 mg | DELAYED_RELEASE_TABLET | Freq: Every day | ORAL | Status: DC
  Administered 2023-08-07 – 2023-08-10 (×4): 40 mg via ORAL
  Filled 2023-08-07 (×5): qty 1

## 2023-08-07 MED ORDER — LABETALOL HCL 5 MG/ML IV SOLN
10.0000 mg | INTRAVENOUS | Status: DC | PRN
  Administered 2023-08-07: 10 mg via INTRAVENOUS
  Filled 2023-08-07: qty 4

## 2023-08-07 MED ORDER — DEXTROSE 50 % IV SOLN: 0.0000 mL | INTRAVENOUS | Status: DC | PRN

## 2023-08-07 MED ORDER — ENOXAPARIN SODIUM 40 MG/0.4ML IJ SOSY
40.0000 mg | PREFILLED_SYRINGE | INTRAMUSCULAR | Status: DC
  Administered 2023-08-07 – 2023-08-09 (×3): 40 mg via SUBCUTANEOUS
  Filled 2023-08-07 (×3): qty 0.4

## 2023-08-07 MED ORDER — SODIUM CHLORIDE 0.9% FLUSH: 3.0000 mL | Freq: Once | INTRAVENOUS | Status: DC

## 2023-08-07 MED ORDER — ORAL CARE MOUTH RINSE: 15.0000 mL | OROMUCOSAL | Status: DC | PRN

## 2023-08-07 MED ORDER — INSULIN REGULAR(HUMAN) IN NACL 100-0.9 UT/100ML-% IV SOLN
INTRAVENOUS | Status: DC
  Administered 2023-08-07: 10 [IU]/h via INTRAVENOUS
  Filled 2023-08-07: qty 100

## 2023-08-07 NOTE — ED Notes (Signed)
Monisha with cl called for transport

## 2023-08-07 NOTE — ED Notes (Signed)
Report called to Jewel Baize, RN in [facility]. [facility] is here now to transport pt.

## 2023-08-07 NOTE — ED Provider Notes (Signed)
  Physical Exam  BP (!) 176/111   Pulse (!) 121   Temp 98.8 F (37.1 C) (Oral)   Resp 18   Wt 72.6 kg   SpO2 99%   BMI 18.97 kg/m   Physical Exam  Procedures  Procedures  ED Course / MDM   Clinical Course as of 08/07/23 [id number]  Carlos [name] Aug 07, 2023  0718 CT reviewed and interpreted and generalized pancreatic inflammation noted on my interpretation and stent identified Radiologist interpretation notes acute on chronic pancreatitis no pseudocyst identified common bile duct stent in place with no intrahepatic bile duct dilatation, moderate gallbladder distention layering stones versus sludge within the gallbladder fundus no pericholecystic fluid or surrounding inflammatory change, mild irregularity of the extrahepatic portal vein [DR]    Clinical Course User Index [DR] Carlos Grizzle, MD   Medical Decision Making Amount and/or Complexity of Data Reviewed Labs: ordered. Radiology: ordered.  Risk Prescription drug management.   37 yo male ho pancreatic duct stricture s/p stent recently removed, with increasing abdominal pain.   Patient with dm induced from pancreatitis Anion gap-incalculable Patient with abdominal pain and nausea and vomiitng Ph 7.06 CT scan pending Plan admission Patient has had fluids, insulin drip in place MS and dilaudid Plan reevaluation and admission after ct results  7:23 AM CT reviewed Patient with ongoing abdominal pain Will give more pain meds RN reports left arm iv not working and patient has not had insulin drip started at this time  7:52 AM Care discussed with Dr. Erenest Blank who accepts the patient in transfer and will place bed order.   Carlos Grizzle, MD 08/08/23 [phone]

## 2023-08-07 NOTE — ED Triage Notes (Signed)
Pt to triage c/o abd pain 10/10 RUQ x 1 week with  N/V. PT states he has Hx of Pancreatitis and this feels the same. VSS NAD PT on room air.

## 2023-08-07 NOTE — ED Provider Notes (Signed)
[name] Provider Note   CSN: [id number] Arrival date & time: 08/06/23  2349     History  Chief Complaint  Patient presents with   Abdominal Pain    Carlos [name] is a 37 y.o. male.  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  Patient has had pancreatic duct stenting in the past.  He reports having stent complications in August, then the stent was removed.  Since leaving the hospital, he describes continuous abdominal pain that has worsened over the past week.  This feels similar to his pancreatitis episodes.  He describes nausea and some vomiting.  No fevers or chills.  He denies alcohol use since leaving the hospital.  The history is provided by the patient.       Home Medications Prior to Admission medications   Medication Sig Start Date End Date Taking? Authorizing Provider  Continuous Glucose Sensor (DEXCOM G6 SENSOR) MISC 1 Device by Other route as directed. Change sensors every 10 days 08/03/23   [name], [name], MD  Continuous Glucose Transmitter (DEXCOM G6 TRANSMITTER) MISC 1 Device by Does not apply route as directed. 08/03/23   [name], [name], MD  HYDROmorphone (DILAUDID) 4 MG tablet Take 1 tablet (4 mg total) by mouth every 4 (four) hours as needed for pain, max daily dose: 6 Tablet. 06/13/23     HYDROmorphone (DILAUDID) 4 MG tablet Take 1 tablet (4 mg total) by mouth every 4 (four) hours as needed for pain.  Max daily dose 6 tablets. 07/13/23     insulin aspart (NOVOLOG) 100 UNIT/ML injection 60 units a day Patient taking differently: Inject 60 Units into the skin daily. 60 units a day 12/14/22   [name], [name], MD  Insulin Disposable Pump (OMNIPOD 5 G6 PODS, GEN 5,) MISC 1 Device by Does not apply route every 3 (three) days. 08/03/23   [name], [name], MD  Insulin Pen Needle 32G X 4 MM  MISC 1 Device by Does not apply route daily in the afternoon. 11/17/22   [name], [name], MD  lipase/protease/amylase (CREON) 36000 UNITS CPEP capsule Take 1-2 capsules (36,000-72,000 Units total) by mouth See admin instructions. Take 72,000 units by mouth three times a day before meals and 36,000 units with any snacks 06/23/23   [name], [name], MD  multivitamin (ONE-A-DAY MEN'S) TABS tablet Take 1 tablet by mouth daily with breakfast.    [provider]  naloxone (NARCAN) nasal spray 4 mg/0.1 mL Place 1 spray into the nose once. 04/05/23   [provider]  omeprazole (PRILOSEC) 40 MG capsule TAKE 1 CAPSULE (40 MG TOTAL) BY MOUTH DAILY. 05/03/23   Carlos Kuster, MD  ondansetron (ZOFRAN) 8 MG tablet Take 1 tablet (8 mg total) by mouth every 8 (eight) hours as needed for nausea or vomiting. 04/23/22   Carlos Close, MD      Allergies    Other    Review of Systems   Review of Systems  All other systems reviewed and are negative.   Physical Exam Updated Vital Signs BP (!) 166/118   Pulse (!) 126   Temp 98.8 F (37.1 C) (Oral)   Resp 20   Wt 72.6 kg   SpO2 100%   BMI 18.97 kg/m  Physical Exam Vitals and nursing note reviewed.  Constitutional:      General: He is not in acute distress.  Appearance: He is well-developed. He is not diaphoretic.  HENT:     Head: Normocephalic and atraumatic.  Cardiovascular:     Rate and Rhythm: Normal rate and regular rhythm.     Heart sounds: No murmur heard.    No friction rub.  Pulmonary:     Effort: Pulmonary effort is normal. No respiratory distress.     Breath sounds: Normal breath sounds. No wheezing or rales.  Abdominal:     General: Bowel sounds are normal. There is no distension.     Palpations: Abdomen is soft.     Tenderness: There is abdominal tenderness in the epigastric area. There is no right CVA tenderness, left CVA tenderness, guarding or rebound.  Musculoskeletal:        General:  Normal range of motion.     Cervical back: Normal range of motion and neck supple.  Skin:    General: Skin is warm and dry.  Neurological:     Mental Status: He is alert and oriented to person, place, and time.     Coordination: Coordination normal.     ED Results / Procedures / Treatments   Labs (all labs ordered are listed, but only abnormal results are displayed) Labs Reviewed  LIPASE, BLOOD  COMPREHENSIVE METABOLIC PANEL  CBC  URINALYSIS, ROUTINE W REFLEX MICROSCOPIC    EKG None  Radiology No results found.  Procedures Procedures    Medications Ordered in ED Medications  sodium chloride flush (NS) 0.9 % injection 3 mL (has no administration in time range)  morphine (PF) 4 MG/ML injection 4 mg (has no administration in time range)  ondansetron (ZOFRAN) injection 4 mg (has no administration in time range)    ED Course/ Medical Decision Making/ A&P  Patient is a 37 year old male with past medical history of recurrent pancreatitis and pancreatic duct stricture.  He has had his pancreatic duct stented on multiple occasions in the past.  The stent was removed in August.  He presents today with worsening abdominal pain over the past week, but worse over the past couple of days.  Patient arrives here with stable vital signs and is afebrile.  He is uncomfortable appearing and there is tenderness in the epigastric region.  Workup initiated including CBC, CMP, and lipase.  Patient has no white count.  He has a mild elevation of his lipase of 80.  His metabolic panel is severely abnormal and reflective of diabetic ketoacidosis.  His sugar is 334, CO2 is less than 7 and anion gap unable to be calculated secondary to low CO2.  Patient was started on aggressive hydration and an insulin drip.  He was also given morphine for pain and Zofran for nausea.  I have discussed care with the hospitalist, [name] who is willing to admit, however would like to wait on the results of a CT  scan to ensure there is nothing surgical.  Study is currently pending.  Patient care signed out at shift change to [name].  She will obtain the results of the CT scan and determine the final disposition.  CRITICAL CARE Performed by: Geoffery Lyons Total critical care time: 40 minutes Critical care time was exclusive of separately billable procedures and treating other patients. Critical care was necessary to treat or prevent imminent or life-threatening deterioration. Critical care was time spent personally by me on the following activities: development of treatment plan with patient and/or surrogate as well as nursing, discussions with consultants, evaluation of patient's response to treatment, examination of  patient, obtaining history from patient or surrogate, ordering and performing treatments and interventions, ordering and review of laboratory studies, ordering and review of radiographic studies, pulse oximetry and re-evaluation of patient's condition.   Final Clinical Impression(s) / ED Diagnoses Final diagnoses:  None    Rx / DC Orders ED Discharge Orders     None         Geoffery Lyons, MD 08/07/23 [phone]

## 2023-08-07 NOTE — H&P (Signed)
History and Physical  Real Vandenbosch WUJ:[id number] DOB: [birth year] DOA: 08/07/2023  PCP: Caesar Bookman, NP   Chief Complaint: Abdominal pain, nausea  HPI: Carlos [name] is a 37 y.o. male with medical history significant for insulin-dependent type I diabetes, chronic pancreatitis, splenic vein thrombosis who is being admitted to the hospital with recurrent acute on chronic pancreatitis and diabetic ketoacidosis.  He was admitted to this facility 8/29 to 9/1 for pancreatitis, during which time he was seen by gastroenterology and had upper endoscopy and removal of his pancreatic stent.  He still has a common bile duct stent in place.  Couple weeks ago, he started having intermittent abdominal pain, says that he got a new phone which will not work with his Dexcom so has just been using sliding scale insulin.  Is supposed to be on Lantus in the evenings, but has not been using this.  Says that his blood sugars have been in the low 200s or better.  He was tolerating the pain fine, until yesterday when he had sudden onset of nausea and vomiting.  Vomited a total of about 3 times, no hematemesis or coffee grounds.  He actually had a normal bowel movement last night when he was in the emergency department.  Denies any chest pain, fevers, chills, diarrhea, melena or hematochezia.  ED Course: The emergency department he was tachycardic and hypertensive, but otherwise hemodynamically stable.  Lab work was performed, significant for glucose 334, normal electrolytes other than sodium 130, creatinine 1.19, alk phos 373, AST ALT unremarkable, total bilirubin normal, lipase 80.  CT scan as noted below shows evidence of pancreatic inflammation likely acute on chronic pancreatitis.  No fluid collection or necrosis, CBD stent in place.  No intrahepatic ductal dilation.  He was found to be in diabetic ketoacidosis, placed on IV fluids, IV insulin drip, and admitted to the hospitalist service at [facility].  Review of  Systems: Please see HPI for pertinent positives and negatives. A complete 10 system review of systems are otherwise negative.  Past Medical History:  Diagnosis Date   AKI (acute kidney injury) (HCC) 12/21/2021   Anxiety    Diabetes (HCC)    Pancreatitis    Splenic vein thrombosis    Past Surgical History:  Procedure Laterality Date   BILIARY BRUSHING  02/02/2023   Procedure: BILIARY BRUSHING;  Surgeon: [name] [name], MD;  Location: [facility];  Service: Gastroenterology;;   BILIARY BRUSHING  04/28/2023   Procedure: BILIARY BRUSHING;  Surgeon: Meryl Dare, MD;  Location: [facility] ENDOSCOPY;  Service: Gastroenterology;;   BILIARY BRUSHING  06/02/2023   Procedure: BILIARY BRUSHING;  Surgeon: Lemar Lofty., MD;  Location: [facility]OSCOPY;  Service: Gastroenterology;;   BILIARY DILATION  12/01/2022   Procedure: BILIARY DILATION;  Surgeon: Lemar Lofty., MD;  Location: [facility]OSCOPY;  Service: Gastroenterology;;   BILIARY DILATION  02/02/2023   Procedure: BILIARY DILATION;  Surgeon: Lemar Lofty., MD;  Location: [facility]OS[facility];  Service: Gastroenterology;;   BILIARY STENT PLACEMENT  04/28/2023   Procedure: BILIARY STENT PLACEMENT;  Surgeon: Meryl Dare, MD;  Location: [facility] ENDOSCOPY;  Service: Gastroenterology;;   BILIARY STENT PLACEMENT N/A 06/02/2023   Procedure: BILIARY STENT PLACEMENT;  Surgeon: Lemar Lofty., MD;  Location: [facility] ENDOSCOPY;  Service: Gastroenterology;  Laterality: N/A;   BIOPSY  12/02/2021   Procedure: BIOPSY;  Surgeon: [name] [name], MD;  Location: [facility] ENDOSCOPY;  Service: Gastroenterology;;   ENDOSCOPIC RETROGRADE CHOLANGIOPANCREATOGRAPHY (ERCP) WITH PROPOFOL N/A 08/12/2022   Procedure:  ENDOSCOPIC RETROGRADE CHOLANGIOPANCREATOGRAPHY (ERCP) WITH PROPOFOL;  Surgeon: [name] [name], MD;  Location: [facility] ENDOSCOPY;  Service: Gastroenterology;  Laterality: N/A;   ENDOSCOPIC RETROGRADE CHOLANGIOPANCREATOGRAPHY  (ERCP) WITH PROPOFOL N/A 12/01/2022   Procedure: ENDOSCOPIC RETROGRADE CHOLANGIOPANCREATOGRAPHY (ERCP) WITH PROPOFOL;  Surgeon: [name] [name], MD;  Location: [facility] ENDOSCOPY;  Service: Gastroenterology;  Laterality: N/A;   ENDOSCOPIC RETROGRADE CHOLANGIOPANCREATOGRAPHY (ERCP) WITH PROPOFOL N/A 02/02/2023   Procedure: ENDOSCOPIC RETROGRADE CHOLANGIOPANCREATOGRAPHY (ERCP) WITH PROPOFOL;  Surgeon: [name] [name], MD;  Location: [facility] ENDOSCOPY;  Service: Gastroenterology;  Laterality: N/A;   ENDOSCOPIC RETROGRADE CHOLANGIOPANCREATOGRAPHY (ERCP) WITH PROPOFOL N/A 06/02/2023   Procedure: ENDOSCOPIC RETROGRADE CHOLANGIOPANCREATOGRAPHY (ERCP) WITH PROPOFOL;  Surgeon: [name] [name], MD;  Location: [facility] ENDOSCOPY;  Service: Gastroenterology;  Laterality: N/A;   ERCP N/A 04/28/2023   Procedure: ENDOSCOPIC RETROGRADE CHOLANGIOPANCREATOGRAPHY (ERCP);  Surgeon: Meryl Dare, MD;  Location: [facility] ENDOSCOPY;  Service: Gastroenterology;  Laterality: N/A;   ESOPHAGOGASTRODUODENOSCOPY N/A 12/01/2022   Procedure: ESOPHAGOGASTRODUODENOSCOPY (EGD);  Surgeon: Lemar Lofty., MD;  Location: [facility];  Service: Gastroenterology;  Laterality: N/A;   ESOPHAGOGASTRODUODENOSCOPY (EGD) WITH PROPOFOL N/A 12/02/2021   Procedure: ESOPHAGOGASTRODUODENOSCOPY (EGD) WITH PROPOFOL;  Surgeon: [name] [name], MD;  Location: [facility] ENDOSCOPY;  Service: Gastroenterology;  Laterality: N/A;   ESOPHAGOGASTRODUODENOSCOPY (EGD) WITH PROPOFOL N/A 07/09/2023   Procedure: ESOPHAGOGASTRODUODENOSCOPY (EGD) WITH PROPOFOL;  Surgeon: Hilarie Fredrickson, MD;  Location: [facility] ENDOSCOPY;  Service: Gastroenterology;  Laterality: N/A;  needs ercp scope for stent pull   FINE NEEDLE ASPIRATION N/A 12/01/2022   Procedure: FINE NEEDLE ASPIRATION (FNA) LINEAR;  Surgeon: Lemar Lofty., MD;  Location: [facility] ENDOSCOPY;  Service: Gastroenterology;  Laterality: N/A;   NEUROLYTIC CELIAC PLEXUS  12/01/2022   Procedure: CELIAC  PLEXUS BLOCK;  Surgeon: [name] [name], MD;  Location: [facility];  Service: Gastroenterology;;   PANCREATIC STENT PLACEMENT  08/12/2022   Procedure: PANCREATIC STENT PLACEMENT;  Surgeon: Lemar Lofty., MD;  Location: [facility];  Service: Gastroenterology;;   PANCREATIC STENT PLACEMENT  12/01/2022   Procedure: PANCREATIC STENT PLACEMENT;  Surgeon: Lemar Lofty., MD;  Location: [facility];  Service: Gastroenterology;;   PANCREATIC STENT PLACEMENT  02/02/2023   Procedure: PANCREATIC STENT PLACEMENT;  Surgeon: Lemar Lofty., MD;  Location: [facility];  Service: Gastroenterology;;   PANCREATIC STENT PLACEMENT  06/02/2023   Procedure: PANCREATIC STENT PLACEMENT;  Surgeon: Lemar Lofty., MD;  Location: [facility];  Service: Gastroenterology;;   REMOVAL OF STONES  12/01/2022   Procedure: REMOVAL OF STONES;  Surgeon: Lemar Lofty., MD;  Location: [facility];  Service: Gastroenterology;;   REMOVAL OF STONES  02/02/2023   Procedure: REMOVAL OF STONES;  Surgeon: Lemar Lofty., MD;  Location: [facility];  Service: Gastroenterology;;   REMOVAL OF STONES  06/02/2023   Procedure: REMOVAL OF STONES;  Surgeon: Lemar Lofty., MD;  Location: [facility];  Service: Gastroenterology;;   [name]  08/12/2022   Procedure: [name];  Surgeon: Lemar Lofty., MD;  Location: [facility];  Service: Gastroenterology;;   [name]  12/01/2022   Procedure: [name];  Surgeon: Lemar Lofty., MD;  Location: [facility];  Service: Gastroenterology;;   [name]  06/02/2023   Procedure: [name];  Surgeon: Lemar Lofty., MD;  Location: [facility];  Service: Gastroenterology;;   Francine Graven REMOVAL  12/01/2022   Procedure: STENT REMOVAL;  Surgeon: Lemar Lofty., MD;  Location: [facility];  Service: Gastroenterology;;   Francine Graven REMOVAL  02/02/2023   Procedure:  STENT REMOVAL;  Surgeon: Lemar Lofty., MD;  Location: [facility] ENDOSCOPY;  Service: Gastroenterology;;   Francine Graven REMOVAL  06/02/2023   Procedure: STENT REMOVAL;  Surgeon: Lemar Lofty., MD;  Location: [facility];  Service: Gastroenterology;;   Francine Graven REMOVAL  07/09/2023   Procedure: STENT REMOVAL;  Surgeon: Hilarie Fredrickson, MD;  Location: [facility];  Service: Gastroenterology;;   UPPER ESOPHAGEAL ENDOSCOPIC ULTRASOUND (EUS) N/A 12/02/2021   Procedure: UPPER ESOPHAGEAL ENDOSCOPIC ULTRASOUND (EUS);  Surgeon: Lemar Lofty., MD;  Location: [facility];  Service: Gastroenterology;  Laterality: N/A;   UPPER ESOPHAGEAL ENDOSCOPIC ULTRASOUND (EUS) N/A 12/01/2022   Procedure: UPPER ESOPHAGEAL ENDOSCOPIC ULTRASOUND (EUS);  Surgeon: Lemar Lofty., MD;  Location: [facility];  Service: Gastroenterology;  Laterality: N/A;    Social History:  reports that he has been smoking cigars. He started smoking about 20 years ago. He has never used smokeless tobacco. He reports that he does not currently use alcohol. He reports that he does not use drugs.   Allergies  Allergen Reactions   Other Anaphylaxis and Other (See Comments)    [name] - anaphylaxis    Family History  Problem Relation Age of Onset   Hypertension Mother    Alcoholism Maternal Uncle    Diabetes Maternal Grandmother    Diabetes Maternal Grandfather    Diabetes Paternal Grandmother    Diabetes Paternal Grandfather    Colon cancer Neg Hx    Esophageal cancer Neg Hx    Inflammatory bowel disease Neg Hx    Liver disease Neg Hx    Pancreatic cancer Neg Hx    Rectal cancer Neg Hx    Stomach cancer Neg Hx      Prior to Admission medications   Medication Sig Start Date End Date Taking? Authorizing Provider  HYDROmorphone (DILAUDID) 4 MG tablet Take 1 tablet (4 mg total) by mouth every 4 (four) hours as needed for pain.  Max daily dose 6 tablets. 07/13/23     insulin aspart (NOVOLOG) 100 UNIT/ML  injection 60 units a day 12/14/22   [name], [name], MD  lipase/protease/amylase (CREON) 36000 UNITS CPEP capsule Take 1-2 capsules (36,000-72,000 Units total) by mouth See admin instructions. Take 72,000 units by mouth three times a day before meals and 36,000 units with any snacks 06/23/23   [name], [name], MD  multivitamin (ONE-A-DAY MEN'S) TABS tablet Take 1 tablet by mouth daily with breakfast.    [provider]  naloxone (NARCAN) nasal spray 4 mg/0.1 mL Place 1 spray into the nose once. 04/05/23   [provider]  omeprazole (PRILOSEC) 40 MG capsule TAKE 1 CAPSULE (40 MG TOTAL) BY MOUTH DAILY. 05/03/23   [name], MD  ondansetron (ZOFRAN) 8 MG tablet Take 1 tablet (8 mg total) by mouth every 8 (eight) hours as needed for nausea or vomiting. 04/23/22   [name], MD    Physical Exam: BP (!) 144/97   Pulse (!) 117   Temp 98.7 F (37.1 C) (Oral)   Resp (!) 21   Wt 72.6 kg   SpO2 100%   BMI 18.97 kg/m   General:  Alert, oriented, calm, in no acute distress, looks dehydrated but otherwise comfortable Eyes: EOMI, clear conjuctivae, white sclerea Neck: supple, no masses, trachea mildline  Cardiovascular: Tachycardic and regular, no murmurs or rubs, no peripheral edema  Respiratory: clear to auscultation bilaterally, no wheezes, no crackles  Abdomen: soft, nontender, nondistended, normal bowel tones heard  Skin: dry, no rashes  Musculoskeletal: no joint effusions, normal range of motion  Psychiatric:  appropriate affect, normal speech  Neurologic: extraocular muscles intact, clear speech, moving all extremities with intact sensorium         Labs on Admission:  Basic Metabolic Panel: Recent Labs  Lab 08/07/23 0428 08/07/23 0623  NA 130* 129*  K 4.5 5.1  CL 92*  --   CO2 <7*  --   GLUCOSE 334*  --   BUN 8  --   CREATININE 1.19  --   CALCIUM 9.7  --    Liver Function Tests: Recent Labs  Lab 08/07/23 0428  AST 21  ALT 34   ALKPHOS 373*  BILITOT 0.7  PROT 9.0*  ALBUMIN 5.3*   Recent Labs  Lab 08/07/23 0428  LIPASE 80*   No results for input(s): "AMMONIA" in the last 168 hours. CBC: Recent Labs  Lab 08/07/23 0428 08/07/23 0623  WBC 9.6  --   HGB 14.7 15.3  HCT 44.1 45.0  MCV 108.9*  --   PLT 371  --    Cardiac Enzymes: No results for input(s): "CKTOTAL", "CKMB", "CKMBINDEX", "TROPONINI" in the last 168 hours.  BNP (last 3 results) No results for input(s): "BNP" in the last 8760 hours.  ProBNP (last 3 results) No results for input(s): "PROBNP" in the last 8760 hours.  CBG: Recent Labs  Lab 08/07/23 0801  GLUCAP 285*    Radiological Exams on Admission: CT ABDOMEN PELVIS W CONTRAST  Result Date: 08/07/2023 CLINICAL DATA:  Abdominal pain.  History of pancreatitis. EXAM: CT ABDOMEN AND PELVIS WITH CONTRAST TECHNIQUE: Multidetector CT imaging of the abdomen and pelvis was performed using the standard protocol following bolus administration of intravenous contrast. RADIATION DOSE REDUCTION: This exam was performed according to the departmental dose-optimization program which includes automated exposure control, adjustment of the mA and/or kV according to patient size and/or use of iterative reconstruction technique. CONTRAST:  OMNIPAQUE IOHEXOL 300 MG/ML  SOLN COMPARISON:  07/07/2023 FINDINGS: Lower chest: No acute abnormality. Hepatobiliary: No focal liver abnormality. Moderate gallbladder distension. Layering stones versus sludge noted within the gallbladder fundus. No pericholecystic fluid or surrounding inflammatory change. There is a common bile duct stent in place. No intrahepatic bile duct dilatation. Pancreas: Scattered calcifications and heterogeneous enhancement of the pancreas noted. Mild pancreatic edema with surrounding soft tissue stranding. No pseudocyst identified. No main duct dilatation or suspicious mass identified. Previous pancreatic duct stent has been removed. Spleen:  Normal in size without focal abnormality. Adrenals/Urinary Tract: Adrenal glands are unremarkable. Kidneys are normal, without renal calculi, focal lesion, or hydronephrosis. Bladder is unremarkable. Stomach/Bowel: Stomach appears normal. The appendix is visualized and is within normal limits. No pathologic dilatation of the large or small bowel loops. No significant bowel wall thickening or inflammatory change. Vascular/Lymphatic: Mild irregularity of the extrahepatic portal vein caliber with scattered focal areas of mild narrowing up to the confluence without signs of thrombosis. 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 Normal appearance of the abdominal aorta. No signs of abdominopelvic adenopathy. Reproductive: Prostate is unremarkable. Other: No free fluid or fluid collections. Musculoskeletal: No acute or significant osseous findings. IMPRESSION: 1. Acute on chronic pancreatitis. No pseudocyst identified. 2. Common bile duct stent in place. No intrahepatic bile duct dilatation. 3. Moderate gallbladder distension. Layering stones versus sludge noted within the gallbladder fundus. No pericholecystic fluid or surrounding inflammatory change. 4. Mild irregularity of the extrahepatic portal vein caliber with scattered focal areas of mild narrowing up to the confluence without signs of thrombosis. 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 Electronically  Signed   By: Signa Kell M.D.   On: 08/07/2023 07:13    Assessment/Plan This is a pleasant 37 year old gentleman with a history of chronic pancreatitis and poorly controlled insulin-dependent type 1 diabetes being admitted to the hospital with acute on chronic pancreatitis and diabetic ketoacidosis.  DKA-with mild hyperglycemia initial blood sugar 336, anion gap, elevated beta hydroxybutyrate and metabolic acidosis. -Inpatient admission to stepdown -N.p.o. with ice chips and meds -Blood sugar now less than 250 will start dextrose  containing LR -Continue every 4 hours BMP, until anion gap is closed x 2  Acute on chronic pancreatitis-with no pseudocyst identified, evidence of necrosis, and no intrahepatic bile duct dilatation.  Presented with abdominal pain and vomiting, and inflammation seen on CT. -Keep n.p.o. as above -Pain and nausea medication as needed -Discussed with Dr. Loreta Ave gastroenterology who will consult (covering for [place] this weekend)  Poorly controlled type 1 diabetes-last hemoglobin A1c 8.3 on 07/06/2023.  Usually on insulin pump, but currently unable to use it with his new phone. -Blood sugar control with insulin drip as above -Anticipate transition to basal bolus dosing when off insulin drip  Hyponatremia-this is pseudohyponatremia due to hyperglycemia, is improving  DVT prophylaxis: Lovenox     Code Status: Full Code  Consults called: Gastroenterology  Admission status: The appropriate patient status for this patient is INPATIENT. Inpatient status is judged to be reasonable and necessary in order to provide the required intensity of service to ensure the patient's safety. The patient's presenting symptoms, physical exam findings, and initial radiographic and laboratory data in the context of their chronic comorbidities is felt to place them at high risk for further clinical deterioration. Furthermore, it is not anticipated that the patient will be medically stable for discharge from the hospital within 2 midnights of admission.    I certify that at the point of admission it is my clinical judgment that the patient will require inpatient hospital care spanning beyond 2 midnights from the point of admission due to high intensity of service, high risk for further deterioration and high frequency of surveillance required  Time spent: 59 minutes  Kamilla Hands Sharlette Dense MD [facility] Pager [phone]  If 7PM-7AM, please contact night-coverage [email or web address] Password [facility]  08/07/2023, 9:49 AM

## 2023-08-07 NOTE — ED Notes (Signed)
I have entered pt. Room at 0705 to find his IV non-functional. My colleague will  attempt u/s-guided IV asap.

## 2023-08-08 ENCOUNTER — Other Ambulatory Visit: Payer: Self-pay

## 2023-08-08 ENCOUNTER — Encounter (HOSPITAL_COMMUNITY): Payer: Self-pay | Admitting: Internal Medicine

## 2023-08-08 DIAGNOSIS — K831 Obstruction of bile duct: Secondary | ICD-10-CM

## 2023-08-08 DIAGNOSIS — K8500 Idiopathic acute pancreatitis without necrosis or infection: Secondary | ICD-10-CM | POA: Diagnosis not present

## 2023-08-08 DIAGNOSIS — E1310 Other specified diabetes mellitus with ketoacidosis without coma: Secondary | ICD-10-CM | POA: Diagnosis not present

## 2023-08-08 DIAGNOSIS — K861 Other chronic pancreatitis: Secondary | ICD-10-CM

## 2023-08-08 DIAGNOSIS — K859 Acute pancreatitis without necrosis or infection, unspecified: Secondary | ICD-10-CM

## 2023-08-08 LAB — BASIC METABOLIC PANEL
Anion gap: 10 (ref 5–15)
Anion gap: 9 (ref 5–15)
Anion gap: 10 (ref 5–15)
BUN: 6 mg/dL (ref 6–20)
BUN: <5 mg/dL — ABNORMAL LOW (ref 6–20)
BUN: <5 mg/dL — ABNORMAL LOW (ref 6–20)
CO2: 19 mmol/L — ABNORMAL LOW (ref 22–32)
CO2: 21 mmol/L — ABNORMAL LOW (ref 22–32)
CO2: 17 mmol/L — ABNORMAL LOW (ref 22–32)
Calcium: 9 mg/dL (ref 8.9–10.3)
Calcium: 9.3 mg/dL (ref 8.9–10.3)
Calcium: 9.1 mg/dL (ref 8.9–10.3)
Chloride: 99 mmol/L (ref 98–111)
Chloride: 100 mmol/L (ref 98–111)
Chloride: 105 mmol/L (ref 98–111)
Creatinine, Ser: 0.63 mg/dL (ref 0.61–1.24)
Creatinine, Ser: 0.65 mg/dL (ref 0.61–1.24)
Creatinine, Ser: 0.59 mg/dL — ABNORMAL LOW (ref 0.61–1.24)
GFR, Estimated: >60 mL/min (ref >60)
GFR, Estimated: >60 mL/min (ref >60)
GFR, Estimated: >60 mL/min (ref >60)
Glucose, Bld: 167 mg/dL — ABNORMAL HIGH (ref 70–99)
Glucose, Bld: 157 mg/dL — ABNORMAL HIGH (ref 70–99)
Glucose, Bld: 194 mg/dL — ABNORMAL HIGH (ref 70–99)
Potassium: 3 mmol/L — ABNORMAL LOW (ref 3.5–5.1)
Potassium: 3.1 mmol/L — ABNORMAL LOW (ref 3.5–5.1)
Potassium: 4.7 mmol/L (ref 3.5–5.1)
Sodium: 128 mmol/L — ABNORMAL LOW (ref 135–145)
Sodium: 130 mmol/L — ABNORMAL LOW (ref 135–145)
Sodium: 132 mmol/L — ABNORMAL LOW (ref 135–145)

## 2023-08-08 LAB — GLUCOSE, CAPILLARY
Glucose-Capillary: 119 mg/dL — ABNORMAL HIGH (ref 70–99)
Glucose-Capillary: 160 mg/dL — ABNORMAL HIGH (ref 70–99)
Glucose-Capillary: 160 mg/dL — ABNORMAL HIGH (ref 70–99)
Glucose-Capillary: 165 mg/dL — ABNORMAL HIGH (ref 70–99)
Glucose-Capillary: 175 mg/dL — ABNORMAL HIGH (ref 70–99)
Glucose-Capillary: 176 mg/dL — ABNORMAL HIGH (ref 70–99)
Glucose-Capillary: 179 mg/dL — ABNORMAL HIGH (ref 70–99)
Glucose-Capillary: 180 mg/dL — ABNORMAL HIGH (ref 70–99)
Glucose-Capillary: 191 mg/dL — ABNORMAL HIGH (ref 70–99)
Glucose-Capillary: 193 mg/dL — ABNORMAL HIGH (ref 70–99)
Glucose-Capillary: 196 mg/dL — ABNORMAL HIGH (ref 70–99)
Glucose-Capillary: 233 mg/dL — ABNORMAL HIGH (ref 70–99)
Glucose-Capillary: 323 mg/dL — ABNORMAL HIGH (ref 70–99)

## 2023-08-08 LAB — BETA-HYDROXYBUTYRIC ACID: Beta-Hydroxybutyric Acid: 1.35 mmol/L — ABNORMAL HIGH (ref 0.05–0.27)

## 2023-08-08 MED ORDER — HYDROMORPHONE HCL 1 MG/ML IJ SOLN
2.0000 mg | INTRAMUSCULAR | Status: DC | PRN
  Administered 2023-08-08 (×3): 2 mg via INTRAVENOUS
  Filled 2023-08-08 (×3): qty 2

## 2023-08-08 MED ORDER — SODIUM CHLORIDE 0.9 % IV SOLN: INTRAVENOUS | Status: AC

## 2023-08-08 MED ORDER — POTASSIUM CHLORIDE CRYS ER 20 MEQ PO TBCR
40.0000 meq | EXTENDED_RELEASE_TABLET | Freq: Two times a day (BID) | ORAL | Status: AC
  Administered 2023-08-08 (×2): 40 meq via ORAL
  Filled 2023-08-08 (×2): qty 2

## 2023-08-08 MED ORDER — INSULIN DETEMIR 100 UNIT/ML ~~LOC~~ SOLN
8.0000 [IU] | Freq: Two times a day (BID) | SUBCUTANEOUS | Status: DC
  Administered 2023-08-08 – 2023-08-09 (×3): 8 [IU] via SUBCUTANEOUS
  Filled 2023-08-08 (×4): qty 0.08

## 2023-08-08 MED ORDER — HYDROMORPHONE HCL 1 MG/ML IJ SOLN
2.0000 mg | INTRAMUSCULAR | Status: AC | PRN
  Administered 2023-08-08 – 2023-08-09 (×5): 2 mg via INTRAVENOUS
  Filled 2023-08-08 (×5): qty 2

## 2023-08-08 MED ORDER — NALOXONE HCL 0.4 MG/ML IJ SOLN: 0.2000 mg | INTRAMUSCULAR | Status: DC | PRN

## 2023-08-08 MED ORDER — INSULIN ASPART 100 UNIT/ML IJ SOLN
0.0000 [IU] | Freq: Three times a day (TID) | INTRAMUSCULAR | Status: DC
  Administered 2023-08-08 (×2): 2 [IU] via SUBCUTANEOUS
  Administered 2023-08-09: 5 [IU] via SUBCUTANEOUS
  Administered 2023-08-09: 3 [IU] via SUBCUTANEOUS
  Administered 2023-08-10: 5 [IU] via SUBCUTANEOUS
  Administered 2023-08-10: 2 [IU] via SUBCUTANEOUS
  Administered 2023-08-10: 5 [IU] via SUBCUTANEOUS

## 2023-08-08 MED ORDER — INSULIN ASPART 100 UNIT/ML IJ SOLN
0.0000 [IU] | Freq: Every day | INTRAMUSCULAR | Status: DC
  Administered 2023-08-08 – 2023-08-09 (×2): 4 [IU] via SUBCUTANEOUS

## 2023-08-08 MED ORDER — POTASSIUM CHLORIDE CRYS ER 20 MEQ PO TBCR
40.0000 meq | EXTENDED_RELEASE_TABLET | Freq: Once | ORAL | Status: AC
  Administered 2023-08-08: 40 meq via ORAL
  Filled 2023-08-08: qty 2

## 2023-08-08 MED ORDER — INSULIN ASPART 100 UNIT/ML IJ SOLN: 3.0000 [IU] | Freq: Three times a day (TID) | INTRAMUSCULAR | Status: DC

## 2023-08-08 NOTE — Consult Note (Addendum)
Consultation  Primary Care Physician:  Caesar Bookman, NP Primary Gastroenterologist:  Dr. [name]       Reason for Consultation: acute on chronic pancreatitis DOA: 08/07/2023         Hospital Day: 2         HPI:   Carlos [name] is a 37 y.o. 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 Presents to the ER with with worsening abdominal pain, nausea, and vomiting. GI consulted.  Work up in ED notable for tachycardia, high blood pressure, but otherwise hemodynamically stable. Sodium 130. Glucose 334. Alk Phos 373. Lipase 80. LFTs normal. Patient was started on aggressive hydration and an insulin drip. He was also given morphine for pain and Zofran for nausea. CT shows below.  08/07/23 Imaging CT Abd/pelvis showed Moderate gallbladder distension. Layering stones versus sludge noted within the gallbladder fundus. No pericholecystic fluid or surrounding inflammatory change. There is a common bile duct stent in place. No intrahepatic bile duct dilatation. Pancreas: Scattered calcifications and heterogeneous enhancement of the pancreas noted. Mild pancreatic edema with surrounding soft tissue stranding. No pseudocyst identified. No main duct dilatation or suspicious mass identified. Previous pancreatic duct stent has been removed.  Extensive Procedure History:  EGD 07/09/2023 with Dr. Marina Goodell with removal of foreign body ( pancreatic duct stent). 06/02/2023 ERCP for PD stent evaluation and CBD stent evaluation in setting of chronic pancreatitis.  04/28/2023 ERCP with Dr. [name] showed a distal CBD stricture c/w findings on MRCP and two indwelling PD stents in good position. CBD stricture brushed for cytology   02/02/2023 ERCP with Dr. [name] removal of an occluded PD stent, removal of PD stones and placement of two plastic stents across PD stricture. Next day went to ER with post ERCP pancreatitis. 12/01/22 EUS/ERCP with Dr. [name] for Celiac Block and evaluation of TOP lesion (likely pseudocyst but to rule out mass) and for ERCP for PD stent exchange in setting of PD stricture.  08/12/2022 ERCP with Dr. [name] with stent placement in the ventral duct of the pancreas for a stricture thought to be due to recurrent pancreatitis of unclear etiology. Unfortunately he developed post ERCP pancreatitis a few days later. EGD/EUS 12/02/21 per Dr. [name]. Remarkable findings included grade D erosive esophagitis and candidiasis. EUS showed chronic pancreatitis with parenchymal loss concerning for significant necrosis, no intervention performed.     Patient lying in bed, reports over the course of the last few days he started to develop worsening upper abdominal pain, nausea, and vomiting not controlled with home Dialudid po. He reports he started to see white specks and became concerned. He had issues with setting his new phone up with Dexcom to monitor his sugars. Reports they were in the 180-200's at home.He had scheduled appt with Dr. [name] on 9/18 which he reports he did not make. He feels minimally better today and tolerated clear liquid diet. His last BM was last night. No blood per rectum.   No family was present at the time of my evaluation.   Previous GI workup:   08/07/2023 CT ABDOMEN AND PELVIS WITH CONTRAST   IMPRESSION: 1. Acute on chronic pancreatitis. No pseudocyst identified. 2. Common bile duct stent in place. No intrahepatic bile duct dilatation. 3. Moderate gallbladder distension.  Layering stones versus sludge noted within the gallbladder fundus. No pericholecystic fluid or surrounding inflammatory change. 4. Mild irregularity of the extrahepatic portal vein caliber  with scattered focal areas of mild narrowing up to the confluence without signs of thrombosis. 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  EGD 07/09/2023 with Dr. Marina Goodell with removal of foreign body ( pancreatic duct stent)  Impression:  1. Selective removal of pancreatic duct stent  2. Biliary stent remains in place  3. Otherwise unremarkable exam with side- viewing endoscope.   07/07/2023 CT ABDOMEN AND PELVIS WITH CONTRAST  IMPRESSION: 1. Pancreatitis with a 9 mm hypodense focus in the pancreatic tail which may represent a developing pseudocyst or focal dilatation of the distal pancreatic duct. 2. Interval placement of a common bile duct stent and replacement of the pancreatic duct stent. 3. Fatty liver. 4. Cholelithiasis. 5. No bowel obstruction. Normal appendix.  04/26/2023 MRCP  IMPRESSION: Diffuse edema of the pancreas with atrophy. Adjacent stranding as well. Please correlate for any clinical evidence of acute on chronic pancreatitis. The calcification seen by prior CT are less appreciated today.   History of an indwelling pancreatic duct stent. There is severe dilatation of the duct towards the tail with a central stricture with a stent in place.   Worsening biliary ductal dilatation with abrupt caliber change of the distal common duct as the duct enters the pancreatic head. In this location there is an adjacent complex cystic lesion, possibly evolving pseudocyst. True severe common duct stricture is possible either from mass effect or true intrinsic narrowing. There is some enhancing wall to the duct in this location. Please correlate with findings at ERCP.   Distended/dilated gallbladder with sludge and stones.   Several collateral vessels throughout the upper abdomen including the porta hepatis, central mesentery in the left upper quadrant with the areas of narrowing of the main portal vein near the portal venous confluence. Please correlate with  previous venous occlusion.   Several prominent to mildly enlarged upper abdominal lymph nodes. Attention on follow-up.  04/25/2023 NUCLEAR MEDICINE HEPATOBILIARY IMAGING  IMPRESSION: 1. No signs of biliary or small bowel activity after 120 minutes of imaging with persistent uptake throughout the liver. Imaging findings are compatible with either common bile duct obstruction or hepatocyte dysfunction.  Abnormal ED labs: Abnormal Labs Reviewed  LIPASE, BLOOD - Abnormal; Notable for the following components:      Result Value   Lipase 80 (*)    All other components within normal limits  COMPREHENSIVE METABOLIC PANEL - Abnormal; Notable for the following components:   Sodium 130 (*)    Chloride 92 (*)    CO2 <7 (*)    Glucose, Bld 334 (*)    Total Protein 9.0 (*)    Albumin 5.3 (*)    Alkaline Phosphatase 373 (*)    All other components within normal limits  CBC - Abnormal; Notable for the following components:   RBC 4.05 (*)    MCV 108.9 (*)    MCH 36.3 (*)    All other components within normal limits  URINALYSIS, ROUTINE W REFLEX MICROSCOPIC - Abnormal; Notable for the following components:   Glucose, UA >1,000 (*)    Hgb urine dipstick SMALL (*)    Ketones, ur >80 (*)    Protein, ur >300 (*)    All other components within normal limits  BETA-HYDROXYBUTYRIC ACID - Abnormal; Notable for the following components:   Beta-Hydroxybutyric Acid >8.00 (*)    All other components within  normal limits  BETA-HYDROXYBUTYRIC ACID - Abnormal; Notable for the following components:   Beta-Hydroxybutyric Acid 6.84 (*)    All other components within normal limits  BETA-HYDROXYBUTYRIC ACID - Abnormal; Notable for the following components:   Beta-Hydroxybutyric Acid 1.76 (*)    All other components within normal limits  BASIC METABOLIC PANEL - Abnormal; Notable for the following components:   Sodium 134 (*)    CO2 12 (*)    Anion gap 20 (*)    All other components within normal limits   BASIC METABOLIC PANEL - Abnormal; Notable for the following components:   Sodium 129 (*)    CO2 12 (*)    Glucose, Bld 184 (*)    Anion gap 17 (*)    All other components within normal limits  BASIC METABOLIC PANEL - Abnormal; Notable for the following components:   Sodium 128 (*)    Potassium 3.4 (*)    CO2 17 (*)    Glucose, Bld 189 (*)    All other components within normal limits  BASIC METABOLIC PANEL - Abnormal; Notable for the following components:   Sodium 127 (*)    CO2 19 (*)    Glucose, Bld 158 (*)    All other components within normal limits  GLUCOSE, CAPILLARY - Abnormal; Notable for the following components:   Glucose-Capillary 177 (*)    All other components within normal limits  GLUCOSE, CAPILLARY - Abnormal; Notable for the following components:   Glucose-Capillary 187 (*)    All other components within normal limits  BASIC METABOLIC PANEL - Abnormal; Notable for the following components:   Sodium 128 (*)    Potassium 3.0 (*)    CO2 19 (*)    Glucose, Bld 167 (*)    All other components within normal limits  GLUCOSE, CAPILLARY - Abnormal; Notable for the following components:   Glucose-Capillary 177 (*)    All other components within normal limits  GLUCOSE, CAPILLARY - Abnormal; Notable for the following components:   Glucose-Capillary 181 (*)    All other components within normal limits  GLUCOSE, CAPILLARY - Abnormal; Notable for the following components:   Glucose-Capillary 172 (*)    All other components within normal limits  BASIC METABOLIC PANEL - Abnormal; Notable for the following components:   Sodium 130 (*)    Potassium 3.1 (*)    CO2 21 (*)    Glucose, Bld 157 (*)    BUN <5 (*)    All other components within normal limits  GLUCOSE, CAPILLARY - Abnormal; Notable for the following components:   Glucose-Capillary 200 (*)    All other components within normal limits  GLUCOSE, CAPILLARY - Abnormal; Notable for the following components:    Glucose-Capillary 186 (*)    All other components within normal limits  GLUCOSE, CAPILLARY - Abnormal; Notable for the following components:   Glucose-Capillary 185 (*)    All other components within normal limits  GLUCOSE, CAPILLARY - Abnormal; Notable for the following components:   Glucose-Capillary 150 (*)    All other components within normal limits  GLUCOSE, CAPILLARY - Abnormal; Notable for the following components:   Glucose-Capillary 177 (*)    All other components within normal limits  GLUCOSE, CAPILLARY - Abnormal; Notable for the following components:   Glucose-Capillary 174 (*)    All other components within normal limits  BETA-HYDROXYBUTYRIC ACID - Abnormal; Notable for the following components:   Beta-Hydroxybutyric Acid 1.35 (*)    All other components within  normal limits  GLUCOSE, CAPILLARY - Abnormal; Notable for the following components:   Glucose-Capillary 175 (*)    All other components within normal limits  GLUCOSE, CAPILLARY - Abnormal; Notable for the following components:   Glucose-Capillary 165 (*)    All other components within normal limits  GLUCOSE, CAPILLARY - Abnormal; Notable for the following components:   Glucose-Capillary 176 (*)    All other components within normal limits  GLUCOSE, CAPILLARY - Abnormal; Notable for the following components:   Glucose-Capillary 191 (*)    All other components within normal limits  GLUCOSE, CAPILLARY - Abnormal; Notable for the following components:   Glucose-Capillary 180 (*)    All other components within normal limits  GLUCOSE, CAPILLARY - Abnormal; Notable for the following components:   Glucose-Capillary 160 (*)    All other components within normal limits  GLUCOSE, CAPILLARY - Abnormal; Notable for the following components:   Glucose-Capillary 179 (*)    All other components within normal limits  GLUCOSE, CAPILLARY - Abnormal; Notable for the following components:   Glucose-Capillary 160 (*)    All  other components within normal limits  GLUCOSE, CAPILLARY - Abnormal; Notable for the following components:   Glucose-Capillary 119 (*)    All other components within normal limits  CBG MONITORING, ED - Abnormal; Notable for the following components:   Glucose-Capillary 285 (*)    All other components within normal limits  I-STAT VENOUS BLOOD GAS, ED - Abnormal; Notable for the following components:   pH, Ven 7.064 (*)    pCO2, Ven 23.5 (*)    pO2, Ven 29 (*)    Bicarbonate 6.7 (*)    TCO2 7 (*)    Acid-base deficit 22.0 (*)    Sodium 129 (*)    All other components within normal limits    Past Medical History:  Diagnosis Date   AKI (acute kidney injury) (HCC) 12/21/2021   Anxiety    Diabetes (HCC)    Pancreatitis    Splenic vein thrombosis     Surgical History:  He  has a past surgical history that includes Upper esophageal endoscopic ultrasound (eus) (N/A, 12/02/2021); Esophagogastroduodenoscopy (egd) with propofol (N/A, 12/02/2021); biopsy (12/02/2021); Endoscopic retrograde cholangiopancreatography (ercp) with propofol (N/A, 08/12/2022); sphincterotomy (08/12/2022); pancreatic stent placement (08/12/2022); Upper esophageal endoscopic ultrasound (eus) (N/A, 12/01/2022); Endoscopic retrograde cholangiopancreatography (ercp) with propofol (N/A, 12/01/2022); Esophagogastroduodenoscopy (N/A, 12/01/2022); Fine needle aspiration (N/A, 12/01/2022); Stent removal (12/01/2022); pancreatic stent placement (12/01/2022); removal of stones (12/01/2022); sphincterotomy (12/01/2022); Biliary dilation (12/01/2022); 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); Endoscopic retrograde cholangiopancreatography (ercp) with propofol (N/A, 02/02/2023); Stent removal (02/02/2023); Biliary dilation (02/02/2023); removal of stones (02/02/2023); Biliary brushing (02/02/2023); pancreatic stent placement (02/02/2023); ERCP (N/A, 04/28/2023); Biliary brushing (04/28/2023); biliary stent placement (04/28/2023); Endoscopic  retrograde cholangiopancreatography (ercp) with propofol (N/A, 06/02/2023); Biliary brushing (06/02/2023); removal of stones (06/02/2023); Stent removal (06/02/2023); biliary stent placement (N/A, 06/02/2023); pancreatic stent placement (06/02/2023); Sphincterotomy (06/02/2023); Esophagogastroduodenoscopy (egd) with propofol (N/A, 07/09/2023); and Stent removal (07/09/2023). Family History:  His family history includes Alcoholism in his maternal uncle; Diabetes in his maternal grandfather, maternal grandmother, paternal grandfather, and paternal grandmother; Hypertension in his mother. Social History:   reports that he has been smoking cigars. He started smoking about 20 years ago. He has never used smokeless tobacco. He reports that he does not currently use alcohol. He reports that he does not use drugs.  Prior to Admission medications   Medication Sig Start Date End Date Taking? Authorizing Provider  HYDROmorphone (DILAUDID) 4 MG tablet  Take 1 tablet (4 mg total) by mouth every 4 (four) hours as needed for pain.  Max daily dose 6 tablets. Patient taking differently: Take 4 mg by mouth 4 (four) times daily. 07/13/23  Yes   insulin aspart (NOVOLOG) 100 UNIT/ML injection 60 units a day Patient taking differently: Inject 0-60 Units into the skin 3 (three) times daily. 12/14/22  Yes [name], [name], MD  lipase/protease/amylase (CREON) 36000 UNITS CPEP capsule Take 1-2 capsules (36,000-72,000 Units total) by mouth See admin instructions. Take 72,000 units by mouth three times a day before meals and 36,000 units with any snacks 06/23/23  Yes [name], [name], MD  multivitamin (ONE-A-DAY MEN'S) TABS tablet Take 1 tablet by mouth daily with breakfast.   Yes [provider]  naloxone (NARCAN) nasal spray 4 mg/0.1 mL Place 1 spray into the nose once as needed (Overdose). 04/05/23  Yes [provider]  omeprazole (PRILOSEC) 40 MG capsule TAKE 1 CAPSULE (40 MG TOTAL) BY MOUTH DAILY.  05/03/23  Yes [name], MD    Current Facility-Administered Medications  Medication Dose Route Frequency Provider Last Rate Last Admin   0.9 %  sodium chloride infusion   Intravenous Continuous Marinda Elk, MD       Chlorhexidine Gluconate Cloth 2 % PADS 6 each  6 each Topical Daily Kirby Crigler, Mir M, MD   6 each at 08/07/23 0947   dextrose 5 % in lactated ringers infusion   Intravenous Continuous Marinda Elk, MD 75 mL/hr at 08/08/23 1308 Rate Change at 08/08/23 0722   dextrose 50 % solution 0-50 mL  0-50 mL Intravenous PRN Geoffery Lyons, MD       enoxaparin (LOVENOX) injection 40 mg  40 mg Subcutaneous Q24H Kirby Crigler, Mir M, MD   40 mg at 08/07/23 2151   hydrALAZINE (APRESOLINE) injection 5 mg  5 mg Intravenous Q6H PRN Maryln Gottron, MD   5 mg at 08/07/23 1142   HYDROmorphone (DILAUDID) injection 2 mg  2 mg Intravenous Q2H PRN Luiz Iron, NP   2 mg at 08/08/23 6578   HYDROmorphone (DILAUDID) tablet 4 mg  4 mg Oral Q4H PRN Maryln Gottron, MD   4 mg at 08/08/23 4696   insulin aspart (novoLOG) injection 0-5 Units  0-5 Units Subcutaneous QHS Marinda Elk, MD       insulin aspart (novoLOG) injection 0-9 Units  0-9 Units Subcutaneous TID WC Marinda Elk, MD       insulin detemir (LEVEMIR) injection 8 Units  8 Units Subcutaneous BID Marinda Elk, MD   8 Units at 08/08/23 2952   labetalol (NORMODYNE) injection 10 mg  10 mg Intravenous Q2H PRN Kirby Crigler, Mir M, MD   10 mg at 08/07/23 1623   naloxone [facility]) injection 0.2 mg  0.2 mg Intravenous PRN Luiz Iron, NP       ondansetron [facility]) injection 4 mg  4 mg Intravenous Q6H PRN Maryln Gottron, MD       Oral care mouth rinse  15 mL Mouth Rinse PRN Kirby Crigler, Mir M, MD       pantoprazole (PROTONIX) EC tablet 40 mg  40 mg Oral Daily Kirby Crigler, Mir M, MD   40 mg at 08/07/23 1143   potassium chloride SA (KLOR-CON M) CR tablet 40 mEq  40 mEq Oral BID Marinda Elk, MD        sodium chloride flush (NS) 0.9 % injection 3 mL  3 mL Intravenous Once Delo,  Riley Lam, MD        Allergies as of 08/06/2023 - Review Complete 07/09/2023  Allergen Reaction Noted   Other Anaphylaxis and Other (See Comments) 09/26/2020    Review of Systems:    Constitutional: No weight loss, fever, chills, weakness or fatigue HEENT: Eyes: No change in vision               Ears, Nose, Throat:  No change in hearing or congestion Skin: No rash or itching Cardiovascular: No chest pain, chest pressure or palpitations   Respiratory: No SOB or cough Gastrointestinal: See HPI and otherwise negative Genitourinary: No dysuria or change in urinary frequency Neurological: No headache, dizziness or syncope Musculoskeletal: No new muscle or joint pain Hematologic: No bleeding or bruising Psychiatric: No history of depression or anxiety     Physical Exam:  Vital signs in last 24 hours: Temp:  [97.7 F (36.5 C)-98.4 F (36.9 C)] 98.3 F (36.8 C) (09/30 0733) Pulse Rate:  [90-123] 114 (09/30 0733) Resp:  [9-20] 11 (09/30 0733) BP: (114-181)/(87-128) 148/94 (09/30 0700) SpO2:  [95 %-100 %] 97 % (09/30 0733) Last BM Date : 08/07/23 Last BM recorded by nurses in past 5 days No data recorded  General:   Pleasant, well developed male in no acute distress Head:  Normocephalic and atraumatic. Eyes: sclerae anicteric,conjunctive pink  Heart:  tachycardic, regular rhythm, no murmurs or gallops Pulm: Clear anteriorly; no wheezing Abdomen:  Soft, Flat AB, Active bowel sounds. moderate tenderness in the upper abdomen. Without guarding and Without rebound, No organomegaly appreciated. Extremities:  Without edema. Msk:  Symmetrical without gross deformities. Peripheral pulses intact.  Neurologic:  Alert and  oriented x4;  No focal deficits.  Skin:   Dry and intact without significant lesions or rashes. Psychiatric:  Cooperative. Normal mood and affect.  LAB RESULTS: Recent Labs    08/07/23 0428  08/07/23 0623  WBC 9.6  --   HGB 14.7 15.3  HCT 44.1 45.0  PLT 371  --    BMET Recent Labs    08/07/23 2139 08/08/23 0240 08/08/23 0539  NA 127* 128* 130*  K 3.5 3.0* 3.1*  CL 99 99 100  CO2 19* 19* 21*  GLUCOSE 158* 167* 157*  BUN 6 6 <5*  CREATININE 0.68 0.63 0.65  CALCIUM 9.1 9.0 9.3   LFT Recent Labs    08/07/23 0428  PROT 9.0*  ALBUMIN 5.3*  AST 21  ALT 34  ALKPHOS 373*  BILITOT 0.7   PT/INR No results for input(s): "LABPROT", "INR" in the last 72 hours.  STUDIES: CT ABDOMEN PELVIS W CONTRAST  Result Date: 08/07/2023 CLINICAL DATA:  Abdominal pain.  History of pancreatitis. EXAM: CT ABDOMEN AND PELVIS WITH CONTRAST TECHNIQUE: Multidetector CT imaging of the abdomen and pelvis was performed using the standard protocol following bolus administration of intravenous contrast. RADIATION DOSE REDUCTION: This exam was performed according to the departmental dose-optimization program which includes automated exposure control, adjustment of the mA and/or kV according to patient size and/or use of iterative reconstruction technique. CONTRAST:  OMNIPAQUE IOHEXOL 300 MG/ML  SOLN COMPARISON:  07/07/2023 FINDINGS: Lower chest: No acute abnormality. Hepatobiliary: No focal liver abnormality. Moderate gallbladder distension. Layering stones versus sludge noted within the gallbladder fundus. No pericholecystic fluid or surrounding inflammatory change. There is a common bile duct stent in place. No intrahepatic bile duct dilatation. Pancreas: Scattered calcifications and heterogeneous enhancement of the pancreas noted. Mild pancreatic edema with surrounding soft tissue stranding. No pseudocyst identified. No  main duct dilatation or suspicious mass identified. Previous pancreatic duct stent has been removed. Spleen: Normal in size without focal abnormality. Adrenals/Urinary Tract: Adrenal glands are unremarkable. Kidneys are normal, without renal calculi, focal lesion, or  hydronephrosis. Bladder is unremarkable. Stomach/Bowel: Stomach appears normal. The appendix is visualized and is within normal limits. No pathologic dilatation of the large or small bowel loops. No significant bowel wall thickening or inflammatory change. Vascular/Lymphatic: Mild irregularity of the extrahepatic portal vein caliber with scattered focal areas of mild narrowing up to the confluence without signs of thrombosis. 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 Normal appearance of the abdominal aorta. No signs of abdominopelvic adenopathy. Reproductive: Prostate is unremarkable. Other: No free fluid or fluid collections. Musculoskeletal: No acute or significant osseous findings. IMPRESSION: 1. Acute on chronic pancreatitis. No pseudocyst identified. 2. Common bile duct stent in place. No intrahepatic bile duct dilatation. 3. Moderate gallbladder distension. Layering stones versus sludge noted within the gallbladder fundus. No pericholecystic fluid or surrounding inflammatory change. 4. Mild irregularity of the extrahepatic portal vein caliber with scattered focal areas of mild narrowing up to the confluence without signs of thrombosis. 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 Electronically Signed   By: Signa Kell M.D.   On: 08/07/2023 07:13      Impression /Plan:  37 year old male with a history of chronic pancreatitis with PD stricture s/p PD stent with history of alcohol use disorder (abstinent from alcohol since 09/2020).  Has had multiple procedures/ERCPs with stents. On 07/10/23 seen with EGD and removal of pancreat stent, still has a CBD stent. Presents to ER with worsening abd pain, nausea, and vomiting. Reports issues with Dexcom at home with new phone, so has been using SSI. Came in with visual spots. Found to have DKA and sugars over 300.  Acute on chronic pancreatitis.Afebrile. WBC 9.6. BUN <5. Creat 0.65. Lipase 80. Alk phos 373. Normal LFT's. CT showing Mild  pancreatic edema with surrounding soft tissue stranding. No intrahepatic duct dilatation. No pseudocyst. No fluid collection. -Clear liquid diet -pain and nausea management per hospitalist  Abdominal pain, upper with nausea and vomiting secondary to DKA complicated by acute on chronic pancreatitis. CT showing Mild pancreatic edema with surrounding soft tissue stranding. No intrahepatic duct dilatation. No pseudocyst. No fluid collection. 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 On home Dilaudid. Reports he takes more frequently than prescribed.  DKA, BS 336 upon arrival to ER. Last Ha1c on 8/28 was 8.3. Poorly controlled DM type 1.  -on insulin pump IV  GERD with hx of esophagitis. On home Omeprazole.  Hyponatremia. Currently 130. Improving with IV fluid hydration. -continue to monitor  Hyperkalemia, secondary DKA today is 3.1 -replete per protocol  Active Problems:   Diabetic ketoacidosis without coma associated with type 1 diabetes mellitus (HCC)   Protein-calorie malnutrition, severe   Uncontrolled type 1 diabetes mellitus with hyperglycemia, with long-term current use of insulin (HCC)   Pancreatitis    LOS: 1 day   Thank you for your kind consultation, we will continue to follow.   Deanna J May  08/08/2023, 9:46 AM   GI ATTENDING  History, laboratories, x-rays all personally reviewed.  Patient seen and examined.  Agree with comprehensive consultation note as outlined above.  Patient known to our service with chronic pancreatitis with intermittent acute exacerbations.  Primary problem felt secondary to alcohol.  Presents now with DKA and abdominal pain.  Noted to have mild inflammatory changes around the pancreas on CT.  Agree with primary service managing DKA and electrolyte issues.  Supportive care for his pancreas.  Biliary stent functioning.  From GI perspective, care is  supportive.  Advance diet as tolerated.  He generally knows when he is feeling well enough to go home.  Follow.  Wilhemina Bonito. Eda Keys., M.D. [facility] Division of Gastroenterology

## 2023-08-08 NOTE — Inpatient Diabetes Management (Signed)
Inpatient Diabetes Program Recommendations  AACE/ADA: New Consensus Statement on Inpatient Glycemic Control (2015)  Target Ranges:  Prepandial:   less than 140 mg/dL      Peak postprandial:   less than 180 mg/dL (1-2 hours)      Critically ill patients:  140 - 180 mg/dL   Lab Results  Component Value Date   GLUCAP 193 (H) 08/08/2023   HGBA1C 8.3 (H) 07/06/2023    Review of Glycemic Control  Diabetes history: T1DM  Outpatient Diabetes medications:  Omnipod 5 with G6  Basal 0.5/hr  ICR 1:1- #6  ISF 50  Target 110  Not using Omnipod b/c he got a new phone and it is not compatible.  He's giving himself Lantus 16 units every day, Humalog SS   Current orders for Inpatient glycemic control: Levemir 8 units BID, Novolog 0-9 units TID and 0-5 units QHS  Spoke with patient at bedside.  He got a new phone 2 weeks ago and his phone is not compatible with his Omnipod.  He has a PDM (receiver), but is not sure where it is located.  He has been administering Basal/Bolus with Lantus 16 units QD and Humalog sliding scale.  He wears the Dexcom G6.  Current with Dr. Lonzo Cloud fro endocrinology.  Once he finds his PDM he will reach out to her for assistance with programming the PDM.  Denies difficulties obtaining insulins.  Encouraged him to ask [facility] about the Dexcom G7 which is the latest model.    Will continue to follow while inpatient.  Thank you, Dulce Sellar, MSN, CDCES Diabetes Coordinator Inpatient Diabetes Program [phone] (team pager from 8a-5p)

## 2023-08-08 NOTE — Plan of Care (Signed)

## 2023-08-08 NOTE — Progress Notes (Signed)
[facility] PROGRESS NOTE    Progress Note  Carlos [name]  ZOX:[id number] DOB: September 30, 1986 DOA: 08/07/2023 PCP: Caesar Bookman, NP     Brief Narrative:   Carlos [name] is an 37 y.o. male past medical history significant for insulin-dependent diabetes mellitus type 1, chronic pancreatitis, splenic vein thrombosis admitted for acute on chronic pancreatitis and DKA, recently discharged on 07/10/2023 was seen by GI upper endoscopy was done with removal of pancreatic stent, with still has a pancreatic common bile duct stent.  He relates he started having intermittent abdominal pain, has been noncompliant with his Lantus at night was tolerating his diet until the day prior to admission.  Assessment/Plan:   Diabetic ketoacidosis without coma associated with type 1 diabetes mellitus/ Uncontrolled type 1 diabetes mellitus with hyperglycemia, with long-term current use of insulin (HCC) Likely due to noncompliance with his insulin Started on IV fluids and IV insulin, blood glucose improved, bicarb greater than 20 anion gap is closed. Will start him on long-acting insulin plus sliding scale.  Do not use meal coverage as he will be on clear liquid diet. Overlap long-acting insulin with IV insulin for about 2 hours. Continue CBGs before meals and at bedtime. He relates his appetite is not working so he is not able to give himself his insulin through his insulin pump. Will start him on a liquid diet. Will consult the diabetes coordinator to see if it she can help him with him.  Hypovolemic hyponatremia: Improving with IV fluids, continue normal saline monitor strict I's and O's and daily weights.  Hypokalemia: Replete orally recheck in the morning.  Protein-calorie malnutrition, severe: Noted counseling.  Acute on pancreatitis CT scan of the abdomen pelvis showed acute on chronic pancreatitis.  No intrahepatic duct dilation. Keep the patient n.p.o. continue IV fluids transition IV fluids once she is  off the insulin to normal saline.   DVT prophylaxis: lovenox Family Communication:none Status is: Inpatient Remains inpatient appropriate because: DKA can be transferred to MedSurg after he is off the insulin IV    Code Status:     Code Status Orders  (From admission, onward)           Start     Ordered   08/07/23 0942  Full code  (Code Status)  Continuous       Question:  By:  Answer:  Consent: discussion documented in EHR   08/07/23 0942           Code Status History     Date Active Date Inactive Code Status Order ID Comments User Context   07/08/2023 0748 07/10/2023 1945 Full Code [id number]  Maryln Gottron, MD Inpatient   04/22/2023 1923 04/30/2023 1839 Full Code [id number]  Briscoe Deutscher, MD ED   02/03/2023 1536 02/05/2023 2022 Full Code [id number]  Maryln Gottron, MD ED   11/11/2022 1241 11/12/2022 2030 Full Code [id number]  Teddy Spike, DO ED   08/14/2022 0016 08/19/2022 1733 Full Code [id number]  Anselm Jungling, DO ED   07/20/2022 0907 07/22/2022 2244 Full Code [id number]  Clydie Braun, MD ED   07/20/2022 0200 07/20/2022 0907 Full Code [id number]  Howerter, Chaney Born, DO ED   04/27/2022 0202 04/30/2022 1757 Full Code [id number]  Marinda Elk, MD ED   01/30/2022 1716 02/01/2022 1859 Full Code [id number]  Lanae Boast, MD ED   12/21/2021 0306 12/22/2021 1806 Full Code [id number]  Howerter, Chaney Born, DO ED   12/07/2021 1255 12/10/2021 2043 Full Code [id number]  Clydie Braun, MD ED   09/14/2021 0009 09/16/2021 2101 Full Code [id number]  Merlene Laughter, DO Inpatient   09/26/2020 1040 10/07/2020 2034 Full Code [id number]  Jonah Blue, MD ED         IV Access:   Peripheral IV   Procedures and diagnostic studies:   CT ABDOMEN PELVIS W CONTRAST  Result Date: 08/07/2023 CLINICAL DATA:  Abdominal pain.  History of pancreatitis. EXAM: CT ABDOMEN AND PELVIS WITH CONTRAST TECHNIQUE: Multidetector CT imaging of the abdomen and pelvis was performed using the standard protocol  following bolus administration of intravenous contrast. RADIATION DOSE REDUCTION: This exam was performed according to the departmental dose-optimization program which includes automated exposure control, adjustment of the mA and/or kV according to patient size and/or use of iterative reconstruction technique. CONTRAST:  OMNIPAQUE IOHEXOL 300 MG/ML  SOLN COMPARISON:  07/07/2023 FINDINGS: Lower chest: No acute abnormality. Hepatobiliary: No focal liver abnormality. Moderate gallbladder distension. Layering stones versus sludge noted within the gallbladder fundus. No pericholecystic fluid or surrounding inflammatory change. There is a common bile duct stent in place. No intrahepatic bile duct dilatation. Pancreas: Scattered calcifications and heterogeneous enhancement of the pancreas noted. Mild pancreatic edema with surrounding soft tissue stranding. No pseudocyst identified. No main duct dilatation or suspicious mass identified. Previous pancreatic duct stent has been removed. Spleen: Normal in size without focal abnormality. Adrenals/Urinary Tract: Adrenal glands are unremarkable. Kidneys are normal, without renal calculi, focal lesion, or hydronephrosis. Bladder is unremarkable. Stomach/Bowel: Stomach appears normal. The appendix is visualized and is within normal limits. No pathologic dilatation of the large or small bowel loops. No significant bowel wall thickening or inflammatory change. Vascular/Lymphatic: Mild irregularity of the extrahepatic portal vein caliber with scattered focal areas of mild narrowing up to the confluence without signs of thrombosis. 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 Normal appearance of the abdominal aorta. No signs of abdominopelvic adenopathy. Reproductive: Prostate is unremarkable. Other: No free fluid or fluid collections. Musculoskeletal: No acute or significant osseous findings. IMPRESSION: 1. Acute on chronic pancreatitis. No pseudocyst identified. 2.  Common bile duct stent in place. No intrahepatic bile duct dilatation. 3. Moderate gallbladder distension. Layering stones versus sludge noted within the gallbladder fundus. No pericholecystic fluid or surrounding inflammatory change. 4. Mild irregularity of the extrahepatic portal vein caliber with scattered focal areas of mild narrowing up to the confluence without signs of thrombosis. 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 Electronically Signed   By: Signa Kell M.D.   On: 08/07/2023 07:13     Medical Consultants:   None.   Subjective:    Carlos [name] he relates his abdominal pain is better he would like to try clear liquid diet.  Objective:    Vitals:   08/08/23 0330 08/08/23 0400 08/08/23 0500 08/08/23 0600  BP:  (!) 161/106 (!) 154/103 (!) 154/105  Pulse:  100 (!) 113 (!) 106  Resp:  10 19 (!) 9  Temp: 98.3 F (36.8 C)     TempSrc: Oral     SpO2:  99% 97% 95%  Weight:      Height:       SpO2: 95 %   Intake/Output Summary (Last 24 hours) at 08/08/2023 0657 Last data filed at 08/08/2023 0554 Gross per 24 hour  Intake 5012.82 ml  Output 1075 ml  Net 3937.82 ml   Filed Weights   08/07/23 0013 08/07/23 0941  Weight: 72.6 kg 62.6 kg    Exam: General exam: In no acute  distress. Respiratory system: Good air movement and clear to auscultation. Cardiovascular system: S1 & S2 heard, RRR. No JVD. Gastrointestinal system: Abdomen is nondistended, soft and nontender.  Extremities: No pedal edema. Skin: No rashes, lesions or ulcers Psychiatry: Judgement and insight appear normal. Mood & affect appropriate.    Data Reviewed:    Labs: Basic Metabolic Panel: Recent Labs  Lab 08/07/23 1355 08/07/23 1803 08/07/23 2139 08/08/23 0240 08/08/23 0539  NA 129* 128* 127* 128* 130*  K 4.0 3.4* 3.5 3.0* 3.1*  CL 100 100 99 99 100  CO2 12* 17* 19* 19* 21*  GLUCOSE 184* 189* 158* 167* 157*  BUN 7 6 6 6  <5*  CREATININE 0.96 0.75 0.68 0.63 0.65  CALCIUM 9.4  9.1 9.1 9.0 9.3   GFR Estimated Creatinine Clearance: 111.9 mL/min (by C-G formula based on SCr of 0.65 mg/dL). Liver Function Tests: Recent Labs  Lab 08/07/23 0428  AST 21  ALT 34  ALKPHOS 373*  BILITOT 0.7  PROT 9.0*  ALBUMIN 5.3*   Recent Labs  Lab 08/07/23 0428  LIPASE 80*   No results for input(s): "AMMONIA" in the last 168 hours. Coagulation profile No results for input(s): "INR", "PROTIME" in the last 168 hours. COVID-19 Labs  No results for input(s): "DDIMER", "FERRITIN", "LDH", "CRP" in the last 72 hours.  Lab Results  Component Value Date   SARSCOV2NAA NEGATIVE 01/30/2022   SARSCOV2NAA NEGATIVE 12/21/2021   SARSCOV2NAA NEGATIVE 09/13/2021   SARSCOV2NAA NEGATIVE 09/26/2020    CBC: Recent Labs  Lab 08/07/23 0428 08/07/23 0623  WBC 9.6  --   HGB 14.7 15.3  HCT 44.1 45.0  MCV 108.9*  --   PLT 371  --    Cardiac Enzymes: No results for input(s): "CKTOTAL", "CKMB", "CKMBINDEX", "TROPONINI" in the last 168 hours. BNP (last 3 results) No results for input(s): "PROBNP" in the last 8760 hours. CBG: Recent Labs  Lab 08/08/23 0330 08/08/23 0434 08/08/23 0435 08/08/23 0531 08/08/23 0634  GLUCAP 176* 191* 180* 160* 179*   D-Dimer: No results for input(s): "DDIMER" in the last 72 hours. Hgb A1c: No results for input(s): "HGBA1C" in the last 72 hours. Lipid Profile: No results for input(s): "CHOL", "HDL", "LDLCALC", "TRIG", "CHOLHDL", "LDLDIRECT" in the last 72 hours. Thyroid function studies: No results for input(s): "TSH", "T4TOTAL", "T3FREE", "THYROIDAB" in the last 72 hours.  Invalid input(s): "FREET3" Anemia work up: No results for input(s): "VITAMINB12", "FOLATE", "FERRITIN", "TIBC", "IRON", "RETICCTPCT" in the last 72 hours. Sepsis Labs: Recent Labs  Lab 08/07/23 0428  WBC 9.6   Microbiology Recent Results (from the past 240 hour(s))  MRSA Next Gen by PCR, Nasal     Status: None   Collection Time: 08/07/23  9:47 AM   Specimen: Nasal  Mucosa; Nasal Swab  Result Value Ref Range Status   MRSA by PCR Next Gen NOT DETECTED NOT DETECTED Final    Comment: (NOTE) The GeneXpert MRSA Assay (FDA approved for NASAL specimens only), is one component of a comprehensive MRSA colonization surveillance program. It is not intended to diagnose MRSA infection nor to guide or monitor treatment for MRSA infections. Test performance is not FDA approved in patients less than 28 years old. Performed at [facility], [address] [place], [place] [address]      Medications:    Chlorhexidine Gluconate Cloth  6 each Topical Daily   enoxaparin (LOVENOX) injection  40 mg Subcutaneous Q24H   pantoprazole  40 mg Oral Daily   sodium chloride  flush  3 mL Intravenous Once   Continuous Infusions:  dextrose 5% lactated ringers 125 mL/hr at 08/08/23 0554   insulin 1.6 Units/hr (08/08/23 0554)   lactated ringers 125 mL/hr at 08/08/23 0554      LOS: 1 day   Marinda Elk  [facility]  08/08/2023, 6:57 AM

## 2023-08-08 NOTE — Plan of Care (Signed)
  Problem: Education: Goal: Knowledge of General Education information will improve Description Including pain rating scale, medication(s)/side effects and non-pharmacologic comfort measures Outcome: Progressing   Problem: Health Behavior/Discharge Planning: Goal: Ability to manage health-related needs will improve Outcome: Progressing   

## 2023-08-08 NOTE — Progress Notes (Signed)
   08/08/23 1012  TOC Brief Assessment  Insurance and Status Reviewed  Patient has primary care physician Yes ([name], Dinah C, NP)  Home environment has been reviewed yes (home alone)  Prior level of function: Independent  Prior/Current Home Services No current home services  Social Determinants of Health Reivew SDOH reviewed no interventions necessary  Readmission risk has been reviewed Yes  Transition of care needs no transition of care needs at this time

## 2023-08-09 ENCOUNTER — Encounter (HOSPITAL_COMMUNITY): Payer: Self-pay | Admitting: Internal Medicine

## 2023-08-09 DIAGNOSIS — K8500 Idiopathic acute pancreatitis without necrosis or infection: Secondary | ICD-10-CM | POA: Diagnosis not present

## 2023-08-09 DIAGNOSIS — K831 Obstruction of bile duct: Secondary | ICD-10-CM | POA: Diagnosis not present

## 2023-08-09 DIAGNOSIS — E1065 Type 1 diabetes mellitus with hyperglycemia: Secondary | ICD-10-CM | POA: Diagnosis not present

## 2023-08-09 DIAGNOSIS — K859 Acute pancreatitis without necrosis or infection, unspecified: Secondary | ICD-10-CM | POA: Diagnosis not present

## 2023-08-09 DIAGNOSIS — E1310 Other specified diabetes mellitus with ketoacidosis without coma: Secondary | ICD-10-CM | POA: Diagnosis not present

## 2023-08-09 DIAGNOSIS — R1013 Epigastric pain: Secondary | ICD-10-CM

## 2023-08-09 DIAGNOSIS — G8929 Other chronic pain: Secondary | ICD-10-CM

## 2023-08-09 LAB — BASIC METABOLIC PANEL
Anion gap: 8 (ref 5–15)
BUN: <5 mg/dL — ABNORMAL LOW (ref 6–20)
CO2: 21 mmol/L — ABNORMAL LOW (ref 22–32)
Calcium: 8.7 mg/dL — ABNORMAL LOW (ref 8.9–10.3)
Chloride: 102 mmol/L (ref 98–111)
Creatinine, Ser: 0.51 mg/dL — ABNORMAL LOW (ref 0.61–1.24)
GFR, Estimated: >60 mL/min (ref >60)
Glucose, Bld: 173 mg/dL — ABNORMAL HIGH (ref 70–99)
Potassium: 3.5 mmol/L (ref 3.5–5.1)
Sodium: 131 mmol/L — ABNORMAL LOW (ref 135–145)

## 2023-08-09 LAB — GLUCOSE, CAPILLARY
Glucose-Capillary: 264 mg/dL — ABNORMAL HIGH (ref 70–99)
Glucose-Capillary: 219 mg/dL — ABNORMAL HIGH (ref 70–99)
Glucose-Capillary: 91 mg/dL (ref 70–99)
Glucose-Capillary: 314 mg/dL — ABNORMAL HIGH (ref 70–99)

## 2023-08-09 MED ORDER — INSULIN ASPART 100 UNIT/ML IJ SOLN
2.0000 [IU] | Freq: Three times a day (TID) | INTRAMUSCULAR | Status: DC
  Administered 2023-08-09 – 2023-08-10 (×5): 2 [IU] via SUBCUTANEOUS

## 2023-08-09 MED ORDER — HYDROMORPHONE HCL 2 MG/ML IJ SOLN
2.0000 mg | INTRAMUSCULAR | Status: DC | PRN
  Administered 2023-08-09 – 2023-08-10 (×4): 2 mg via INTRAVENOUS
  Filled 2023-08-09 (×4): qty 1

## 2023-08-09 MED ORDER — POTASSIUM CHLORIDE CRYS ER 20 MEQ PO TBCR
40.0000 meq | EXTENDED_RELEASE_TABLET | Freq: Two times a day (BID) | ORAL | Status: AC
  Administered 2023-08-09 (×2): 40 meq via ORAL
  Filled 2023-08-09 (×2): qty 2

## 2023-08-09 MED ORDER — HYDROMORPHONE HCL 2 MG PO TABS
4.0000 mg | ORAL_TABLET | ORAL | Status: DC | PRN
  Administered 2023-08-09: 4 mg via ORAL
  Filled 2023-08-09: qty 2

## 2023-08-09 MED ORDER — INSULIN DETEMIR 100 UNIT/ML ~~LOC~~ SOLN
10.0000 [IU] | Freq: Two times a day (BID) | SUBCUTANEOUS | Status: DC
  Administered 2023-08-09 – 2023-08-10 (×2): 10 [IU] via SUBCUTANEOUS
  Filled 2023-08-09 (×3): qty 0.1

## 2023-08-09 MED ORDER — INSULIN DETEMIR 100 UNIT/ML ~~LOC~~ SOLN
15.0000 [IU] | Freq: Two times a day (BID) | SUBCUTANEOUS | Status: DC
Start: 2023-08-09 — End: 2023-08-09

## 2023-08-09 NOTE — Progress Notes (Addendum)
Progress Note  Primary GI: Dr. [name]  LOS: 2 days   Chief Complaint: acute on chronic pancreatitis    Subjective  Patient lying in bed, no events over night. Reports he tolerated clear liquid diet. No nausea or vomiting. He continues with intermittent upper abdominal pain decreased with pain medication oral Dilaudid scheduled and prn IV Dilaudid for breakthrough. Last BM this morning, no blood per rectum. He would like to advance to full liquid diet.  No family was present at the time of my evaluation.  Objective   Vital signs in last 24 hours: Temp:  [97.6 F (36.4 C)-98.7 F (37.1 C)] 98.7 F (37.1 C) (10/01 0417) Pulse Rate:  [92-103] 101 (10/01 0417) Resp:  [10-19] 15 (10/01 0417) BP: (130-147)/(91-105) 147/99 (10/01 0417) SpO2:  [96 %-100 %] 100 % (10/01 0417) Last BM Date : 08/08/23 Last BM recorded by nurses in past 5 days No data recorded  General:   male in no acute distress Heart:  Regular rate and rhythm; no murmurs Pulm: Clear anteriorly; no wheezing Abdomen: soft, nondistended, normal bowel sounds in all quadrants. Tenderness to upper abd with palpation, without guarding. No organomegaly appreciated. Extremities:  No edema Neurologic:  Alert and  oriented x4;  No focal deficits.  Psych:  Cooperative. Normal mood and affect.  Intake/Output from previous day: 09/30 0701 - 10/01 0700 In: 2183.7 [I.V.:2183.7] Out: -  Intake/Output this shift: No intake/output data recorded.  Studies/Results: No results found.  Lab Results: Recent Labs    08/07/23 0428 08/07/23 0623  WBC 9.6  --   HGB 14.7 15.3  HCT 44.1 45.0  PLT 371  --    BMET Recent Labs    08/08/23 0539 08/08/23 1233 08/09/23 0447  NA 130* 132* 131*  K 3.1* 4.7 3.5  CL 100 105 102  CO2 21* 17* 21*  GLUCOSE 157* 194* 173*  BUN <5* <5* <5*  CREATININE 0.65 0.59* 0.51*  CALCIUM 9.3 9.1 8.7*   LFT Recent Labs    08/07/23 0428  PROT 9.0*  ALBUMIN 5.3*  AST 21  ALT 34   ALKPHOS 373*  BILITOT 0.7   PT/INR No results for input(s): "LABPROT", "INR" in the last 72 hours.   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  Has had multiple procedures/ERCPs with stents. On 07/10/23 seen with EGD and removal of pancreat stent, still has a CBD stent. Presents to ER with worsening abd pain, nausea, and vomiting. Reports issues with Dexcom at home with new phone, so has been using SSI. Came in with visual spots. Found to have DKA and sugars over 300.  Impression/Plan:  Acute on chronic pancreatitis.Afebrile. WBC 9.6 on 9/29. BUN <5. Creat 0.51. Lipase 80 on 9/29. Alk phos 373 on 9/29. Normal LFT's. CT showing Mild pancreatic edema with surrounding soft tissue stranding. No intrahepatic duct dilatation. No pseudocyst. No fluid collection. -Supportive management -Advance to full liquid diet -  pain and nausea management per hospitalist   Abdominal pain, upper with nausea and vomiting secondary to DKA complicated by acute on chronic pancreatitis. CT showing Mild pancreatic edema with surrounding soft tissue stranding. No intrahepatic duct dilatation. No pseudocyst. No fluid collection. -Advance to full liquid diet -pain and nausea management per hospitalist   Opioid dependency due to the treatment of pain for chronic pancreatitis. On home Dilaudid. Reports he takes more frequently than prescribed.   DKA, BS 336 upon arrival to ER. Last Ha1c on 8/28 was  8.3. Doing better today off insulin IV pump. Poorly controlled DM type 1.  -SSI   GERD with hx of esophagitis. On PPI.   Hyponatremia. Currently 131. Improving with IV fluid hydration. -continue to monitor   Hyperkalemia, secondary DKA today is 3.5 -replete per protocol   Active Problems:   Diabetic ketoacidosis without coma associated with type 1 diabetes mellitus (HCC)   Protein-calorie malnutrition, severe   Uncontrolled type 1 diabetes mellitus with hyperglycemia, with long-term current use of insulin (HCC)   Pancreatitis   Deanna J May  08/09/2023, 8:39 AM  GI ATTENDING  Interval history data reviewed.  Patient seen and examined.  Overall, all parameters improving.  DKA being addressed.  Still with his chronic pain, though better.  Tolerating limited diet.  No new recommendations from GI perspective.  Advance diet as tolerated.  Pain control per primary service.  Patient typically knows when he is ready for discharge.  He should schedule a follow-up outpatient appointment with Dr. [name].  He was a no-show for his scheduled appointment 2 weeks ago.  Will sign off.  Wilhemina Bonito. Eda Keys., M.D. [facility] Division of Gastroenterology

## 2023-08-09 NOTE — Plan of Care (Signed)

## 2023-08-09 NOTE — Progress Notes (Addendum)
[facility] PROGRESS NOTE    Progress Note  Carlos [name]  KGM:[id number] DOB: Aug 30, 1986 DOA: 08/07/2023 PCP: Caesar Bookman, NP     Brief Narrative:   Carlos [name] is an 37 y.o. male past medical history significant for insulin-dependent diabetes mellitus type 1, chronic pancreatitis, splenic vein thrombosis admitted for acute on chronic pancreatitis and DKA, recently discharged on 07/10/2023 was seen by GI upper endoscopy was done with removal of pancreatic stent, with still has a pancreatic common bile duct stent.  He relates he started having intermittent abdominal pain, has been noncompliant with his insulin. Assessment/Plan:   Diabetic ketoacidosis without coma associated with type 1 diabetes mellitus/ Uncontrolled type 1 diabetes mellitus with hyperglycemia, with long-term current use of insulin (HCC) Likely due to noncompliance with his insulin, App and pump were not interacting, so they were not working. Patient was started on IV fluids and IV insulin blood glucose improved anion gap is closed. Increase long-acting insulin continue sliding scale.  Add meal coverage as he is now tolerating full liquid diet, and his blood glucose is rising. Continue full liquid diet.  Hypovolemic hyponatremia: Improved with IV fluids.  Hypokalemia: Borderline today try to keep greater than 4. Replete orally.  Protein-calorie malnutrition, severe: Noted counseling.  Acute on pancreatitis CT scan of the abdomen pelvis showed acute on chronic pancreatitis.  No intrahepatic duct dilation. Keep the patient n.p.o. continue IV fluids transition IV fluids once she is off the insulin to normal saline. Treated conservatively with IV fluids narcotics and initially n.p.o., now his diet has been advanced to full we will see how he does over the next 24 hours. Appreciate GIs assistance. Will wean off IV narcotics continue orals.   DVT prophylaxis: lovenox Family Communication:none Status is:  Inpatient Remains inpatient appropriate because: DKA can be transferred to MedSurg after he is off the insulin IV    Code Status:     Code Status Orders  (From admission, onward)           Start     Ordered   08/07/23 0942  Full code  (Code Status)  Continuous       Question:  By:  Answer:  Consent: discussion documented in EHR   08/07/23 0942           Code Status History     Date Active Date Inactive Code Status Order ID Comments User Context   07/08/2023 0748 07/10/2023 1945 Full Code [id number]  Maryln Gottron, MD Inpatient   04/22/2023 1923 04/30/2023 1839 Full Code [id number]  Briscoe Deutscher, MD ED   02/03/2023 1536 02/05/2023 2022 Full Code [id number]  Maryln Gottron, MD ED   11/11/2022 1241 11/12/2022 2030 Full Code [id number]  Teddy Spike, DO ED   08/14/2022 0016 08/19/2022 1733 Full Code [id number]  Anselm Jungling, DO ED   07/20/2022 0907 07/22/2022 2244 Full Code [id number]  Clydie Braun, MD ED   07/20/2022 0200 07/20/2022 0907 Full Code [id number]  Howerter, Chaney Born, DO ED   04/27/2022 0202 04/30/2022 1757 Full Code [id number]  Marinda Elk, MD ED   01/30/2022 1716 02/01/2022 1859 Full Code [id number]  Lanae Boast, MD ED   12/21/2021 0306 12/22/2021 1806 Full Code [id number]  Howerter, Chaney Born, DO ED   12/07/2021 1255 12/10/2021 2043 Full Code [id number]  Clydie Braun, MD ED   09/14/2021 0009 09/16/2021 2101 Full Code [id number]  Marguerita Merles Brewster, DO Inpatient   09/26/2020 1040 10/07/2020 2034 Full  Code [id number]  Jonah Blue, MD ED         IV Access:   Peripheral IV   Procedures and diagnostic studies:   No results found.   Medical Consultants:   None.   Subjective:    Carlos [name] tolerated clear liquid diet with no difficulties: Allowed to be advance to full liquid diet.  Objective:    Vitals:   08/08/23 1100 08/08/23 1158 08/08/23 1958 08/09/23 0417  BP: (!) 142/105  (!) 130/91 (!) 147/99  Pulse: 92  (!) 103 (!) 101  Resp: 10  19 15   Temp:   98.3 F (36.8 C) 97.6 F (36.4 C) 98.7 F (37.1 C)  TempSrc:  Oral Oral Oral  SpO2: 96%  100% 100%  Weight:      Height:       SpO2: 100 %   Intake/Output Summary (Last 24 hours) at 08/09/2023 0952 Last data filed at 08/09/2023 0425 Gross per 24 hour  Intake 2183.69 ml  Output --  Net 2183.69 ml   Filed Weights   08/07/23 0013 08/07/23 0941  Weight: 72.6 kg 62.6 kg    Exam: General exam: In no acute distress. Respiratory system: Good air movement and clear to auscultation. Cardiovascular system: S1 & S2 heard, RRR. No JVD. Gastrointestinal system: Abdomen is nondistended, soft and nontender.  Extremities: No pedal edema. Skin: No rashes, lesions or ulcers Psychiatry: Judgement and insight appear normal. Mood & affect appropriate.  Data Reviewed:    Labs: Basic Metabolic Panel: Recent Labs  Lab 08/07/23 2139 08/08/23 0240 08/08/23 0539 08/08/23 1233 08/09/23 0447  NA 127* 128* 130* 132* 131*  K 3.5 3.0* 3.1* 4.7 3.5  CL 99 99 100 105 102  CO2 19* 19* 21* 17* 21*  GLUCOSE 158* 167* 157* 194* 173*  BUN 6 6 <5* <5* <5*  CREATININE 0.68 0.63 0.65 0.59* 0.51*  CALCIUM 9.1 9.0 9.3 9.1 8.7*   GFR Estimated Creatinine Clearance: 111.9 mL/min (A) (by C-G formula based on SCr of 0.51 mg/dL (L)). Liver Function Tests: Recent Labs  Lab 08/07/23 0428  AST 21  ALT 34  ALKPHOS 373*  BILITOT 0.7  PROT 9.0*  ALBUMIN 5.3*   Recent Labs  Lab 08/07/23 0428  LIPASE 80*   No results for input(s): "AMMONIA" in the last 168 hours. Coagulation profile No results for input(s): "INR", "PROTIME" in the last 168 hours. COVID-19 Labs  No results for input(s): "DDIMER", "FERRITIN", "LDH", "CRP" in the last 72 hours.  Lab Results  Component Value Date   SARSCOV2NAA NEGATIVE 01/30/2022   SARSCOV2NAA NEGATIVE 12/21/2021   SARSCOV2NAA NEGATIVE 09/13/2021   SARSCOV2NAA NEGATIVE 09/26/2020    CBC: Recent Labs  Lab 08/07/23 0428 08/07/23 0623  WBC 9.6  --   HGB  14.7 15.3  HCT 44.1 45.0  MCV 108.9*  --   PLT 371  --    Cardiac Enzymes: No results for input(s): "CKTOTAL", "CKMB", "CKMBINDEX", "TROPONINI" in the last 168 hours. BNP (last 3 results) No results for input(s): "PROBNP" in the last 8760 hours. CBG: Recent Labs  Lab 08/08/23 1003 08/08/23 1157 08/08/23 1759 08/08/23 1959 08/09/23 0810  GLUCAP 233* 193* 196* 323* 264*   D-Dimer: No results for input(s): "DDIMER" in the last 72 hours. Hgb A1c: No results for input(s): "HGBA1C" in the last 72 hours. Lipid Profile: No results for input(s): "CHOL", "HDL", "LDLCALC", "TRIG", "CHOLHDL", "LDLDIRECT" in the last 72 hours. Thyroid function studies: No results for input(s): "TSH", "T4TOTAL", "T3FREE", "THYROIDAB"  in the last 72 hours.  Invalid input(s): "FREET3" Anemia work up: No results for input(s): "VITAMINB12", "FOLATE", "FERRITIN", "TIBC", "IRON", "RETICCTPCT" in the last 72 hours. Sepsis Labs: Recent Labs  Lab 08/07/23 0428  WBC 9.6   Microbiology Recent Results (from the past 240 hour(s))  MRSA Next Gen by PCR, Nasal     Status: None   Collection Time: 08/07/23  9:47 AM   Specimen: Nasal Mucosa; Nasal Swab  Result Value Ref Range Status   MRSA by PCR Next Gen NOT DETECTED NOT DETECTED Final    Comment: (NOTE) The GeneXpert MRSA Assay (FDA approved for NASAL specimens only), is one component of a comprehensive MRSA colonization surveillance program. It is not intended to diagnose MRSA infection nor to guide or monitor treatment for MRSA infections. Test performance is not FDA approved in patients less than 27 years old. Performed at [facility], [address], [place], [place] [address]      Medications:    enoxaparin (LOVENOX) injection  40 mg Subcutaneous Q24H   insulin aspart  0-5 Units Subcutaneous QHS   insulin aspart  0-9 Units Subcutaneous TID WC   insulin detemir  15 Units Subcutaneous BID   pantoprazole  40 mg Oral Daily    sodium chloride flush  3 mL Intravenous Once   Continuous Infusions:      LOS: 2 days   Marinda Elk  [facility]  08/09/2023, 9:52 AM

## 2023-08-09 NOTE — Plan of Care (Signed)

## 2023-08-10 DIAGNOSIS — E109 Type 1 diabetes mellitus without complications: Secondary | ICD-10-CM

## 2023-08-10 LAB — GLUCOSE, CAPILLARY
Glucose-Capillary: 192 mg/dL — ABNORMAL HIGH (ref 70–99)
Glucose-Capillary: 312 mg/dL — ABNORMAL HIGH (ref 70–99)
Glucose-Capillary: 252 mg/dL — ABNORMAL HIGH (ref 70–99)

## 2023-08-10 MED ORDER — ENSURE ENLIVE PO LIQD
237.0000 mL | Freq: Two times a day (BID) | ORAL | Status: DC
  Administered 2023-08-10 (×2): 237 mL via ORAL

## 2023-08-10 MED ORDER — HYDROMORPHONE HCL 1 MG/ML IJ SOLN
1.0000 mg | INTRAMUSCULAR | Status: DC | PRN
  Administered 2023-08-10 (×2): 1 mg via INTRAVENOUS
  Filled 2023-08-10 (×2): qty 1

## 2023-08-10 MED ORDER — DICYCLOMINE HCL 10 MG PO CAPS
10.0000 mg | ORAL_CAPSULE | Freq: Three times a day (TID) | ORAL | 0 refills | Status: DC | PRN
  Filled 2023-08-10: qty 30, 10d supply, fill #0

## 2023-08-10 MED ORDER — GABAPENTIN 100 MG PO CAPS
100.0000 mg | ORAL_CAPSULE | Freq: Three times a day (TID) | ORAL | 0 refills | Status: DC
  Filled 2023-08-10: qty 90, 30d supply, fill #0

## 2023-08-10 MED ORDER — GABAPENTIN 100 MG PO CAPS
100.0000 mg | ORAL_CAPSULE | Freq: Three times a day (TID) | ORAL | 0 refills | Status: DC
Start: 2023-08-10 — End: 2023-11-07

## 2023-08-10 MED ORDER — METOCLOPRAMIDE HCL 5 MG PO TABS
5.0000 mg | ORAL_TABLET | Freq: Three times a day (TID) | ORAL | 0 refills | Status: DC | PRN
  Filled 2023-08-10: qty 15, 5d supply, fill #0

## 2023-08-10 MED ORDER — ENSURE ENLIVE PO LIQD: 237.0000 mL | Freq: Two times a day (BID) | ORAL | Status: DC

## 2023-08-10 MED ORDER — HYDROMORPHONE HCL 2 MG PO TABS: 4.0000 mg | ORAL_TABLET | ORAL | Status: DC | PRN

## 2023-08-10 MED ORDER — METOCLOPRAMIDE HCL 5 MG PO TABS: 5.0000 mg | ORAL_TABLET | Freq: Three times a day (TID) | ORAL | 0 refills | Status: DC | PRN

## 2023-08-10 MED ORDER — HYDROMORPHONE HCL 2 MG PO TABS
4.0000 mg | ORAL_TABLET | ORAL | Status: DC | PRN
  Administered 2023-08-10: 4 mg via ORAL
  Filled 2023-08-10 (×2): qty 2

## 2023-08-10 MED ORDER — PANCRELIPASE (LIP-PROT-AMYL) 36000-114000 UNITS PO CPEP
72000.0000 [IU] | ORAL_CAPSULE | Freq: Three times a day (TID) | ORAL | Status: DC
  Administered 2023-08-10 (×3): 72000 [IU] via ORAL
  Filled 2023-08-10 (×3): qty 2

## 2023-08-10 MED ORDER — DICYCLOMINE HCL 10 MG PO CAPS: 10.0000 mg | ORAL_CAPSULE | Freq: Three times a day (TID) | ORAL | 0 refills | Status: DC | PRN

## 2023-08-10 NOTE — Progress Notes (Signed)
Discharge instructions reviewed with patient.  All questions answered to patient satisfaction.  Personal belongings packed and in patient possession.  VSS, no signs of distress.  Patient ambulated independently to elevator for discharge home via POV with family.

## 2023-08-10 NOTE — Plan of Care (Signed)
Plan of Care reviewed. 

## 2023-08-11 ENCOUNTER — Telehealth: Payer: Medicaid Other

## 2023-08-11 ENCOUNTER — Telehealth: Payer: Self-pay

## 2023-08-11 ENCOUNTER — Other Ambulatory Visit (HOSPITAL_COMMUNITY): Payer: Self-pay

## 2023-08-11 ENCOUNTER — Other Ambulatory Visit (HOSPITAL_BASED_OUTPATIENT_CLINIC_OR_DEPARTMENT_OTHER): Payer: Self-pay

## 2023-08-11 ENCOUNTER — Other Ambulatory Visit: Payer: Self-pay

## 2023-08-11 MED ORDER — HYDROMORPHONE HCL 4 MG PO TABS
4.0000 mg | ORAL_TABLET | ORAL | 0 refills | Status: DC | PRN
  Filled 2023-08-11: qty 165, 28d supply, fill #0
  Filled 2023-08-11: qty 15, 2d supply, fill #0

## 2023-08-11 NOTE — Transitions of Care (Post Inpatient/ED Visit) (Signed)
08/11/2023  Name: Amos Delapp MRN: [id number] DOB: [birth year]  Today's TOC FU Call Status: Today's TOC FU Call Status:: Unsuccessful Call (1st Attempt) Unsuccessful Call (1st Attempt) Date: 08/11/23  Attempted to reach the patient regarding the most recent Inpatient/ED visit.  Follow Up Plan: Additional outreach attempts will be made to reach the patient to complete the Transitions of Care (Post Inpatient/ED visit) call.   Alyse Low, RN, BA, [facility], CRRN [facility] Coordinator, Transition of Care Ph # [phone]

## 2023-08-11 NOTE — Discharge Summary (Addendum)
Physician Discharge Summary   Patient: Carlos [name] MRN: [id number] DOB: [birth year]  Admit date:     08/07/2023  Discharge date: 08/10/2023  Discharge Physician: Lynden Oxford  PCP: Caesar Bookman, NP  Recommendations at discharge: Follow-up with PCP in 1 week.   Follow-up Information     [name], Dinah C, NP. Schedule an appointment as soon as possible for a visit in 1 week(s).   Specialty: Family Medicine Contact information: [address] [place] [address] [phone]                Discharge Diagnoses: Active Problems:   Diabetic ketoacidosis without coma associated with type 1 diabetes mellitus (HCC)   Uncontrolled type 1 diabetes mellitus with hyperglycemia, with long-term current use of insulin (HCC)   Protein-calorie malnutrition, severe   Pancreatitis   Biliary obstruction  Hospital Course: Diabetic ketoacidosis without coma associated with type 1 diabetes mellitus/ Uncontrolled type 1 diabetes mellitus with hyperglycemia, with long-term current use of insulin (HCC) Likely due to noncompliance with his insulin, App and pump were not interacting, so they were not working. Patient was started on IV fluids and IV insulin blood glucose improved anion gap is closed. Resuming home regimen on discharge for insulin therapy.   Hypovolemic hyponatremia: Resolved.  Likely from poor p.o. intake.   Hypokalemia: Repleted.   Protein-calorie malnutrition, severe: counseling provided.   Acute on pancreatitis CT scan of the abdomen pelvis showed acute on chronic pancreatitis.  No intrahepatic duct dilation. Treated conservatively with IV fluid and n.p.o. status. GI was consulted. Patient continues to have 10 out of 10 pain but reports that this is his chronic pain. Patient requested to be discharged home despite his ongoing pain. He did tolerate his breakfast well.  Gabapentin was added to his regimen on discharge to aid with pain control. For stomach cramps Bentyl  was also provided. For concern for gastroparesis causing acute abdominal pain Reglan prescription was also provided. Recommend follow-up with GI as well as pain management.  Pain control - [employer] Controlled Substance Reporting System database was reviewed. and patient was instructed, not to drive, operate heavy machinery, perform activities at heights, swimming or participation in water activities or provide baby-sitting services while on Pain, Sleep and Anxiety Medications; until their outpatient Physician has advised to do so again. Also recommended to not to take more than prescribed Pain, Sleep and Anxiety Medications.  Consultants:  GI  Procedures performed:  None  DISCHARGE MEDICATION: Allergies as of 08/10/2023       Reactions   Other Anaphylaxis, Other (See Comments)   [name] - anaphylaxis        Medication List     TAKE these medications    dicyclomine 10 MG capsule Commonly known as: BENTYL Take 1 capsule (10 mg total) by mouth 3 (three) times daily as needed for spasms (stomach cramps.).   gabapentin 100 MG capsule Commonly known as: Neurontin Take 1 capsule (100 mg total) by mouth 3 (three) times daily.   HYDROmorphone 4 MG tablet Commonly known as: DILAUDID Take 1 tablet (4 mg total) by mouth every 4 (four) hours as needed for pain.  Max daily dose 6 tablets. What changed: when to take this   insulin aspart 100 UNIT/ML injection Commonly known as: novoLOG 60 units a day What changed:  how much to take how to take this when to take this additional instructions   lipase/protease/amylase 29518 UNITS Cpep capsule Commonly known as: Creon Take 1-2 capsules (36,000-72,000  Units total) by mouth See admin instructions. Take 72,000 units by mouth three times a day before meals and 36,000 units with any snacks   metoCLOPramide 5 MG tablet Commonly known as: Reglan Take 1 tablet (5 mg total) by mouth every 8 (eight) hours as needed for nausea or  vomiting.   multivitamin Tabs tablet Take 1 tablet by mouth daily with breakfast.   naloxone 4 MG/0.1ML Liqd nasal spray kit Commonly known as: NARCAN Place 1 spray into the nose once as needed (Overdose).   omeprazole 40 MG capsule Commonly known as: PRILOSEC TAKE 1 CAPSULE (40 MG TOTAL) BY MOUTH DAILY.       Disposition: Home Diet recommendation: Cardiac diet  Discharge Exam: Vitals:   08/09/23 1146 08/09/23 2050 08/10/23 0647 08/10/23 1320  BP: 127/85 (!) 144/97 (!) 124/92 (!) 130/96  Pulse: 87 96 95 (!) 103  Resp: 20 18 20 18   Temp: 98.8 F (37.1 C) 98.3 F (36.8 C) 98.1 F (36.7 C) 98.1 F (36.7 C)  TempSrc:    Oral  SpO2: 100% 100% 100% 100%  Weight:      Height:       General: Appear in mild distress; no visible Abnormal Neck Mass Or lumps, Conjunctiva normal Cardiovascular: S1 and S2 Present, no Murmur, Respiratory: good respiratory effort, Bilateral Air entry present and CTA, no Crackles, no wheezes Abdomen: Bowel Sound present, mild diffuse tenderness Extremities: no Pedal edema Neurology: alert and oriented to time, place, and person  Filed Weights   08/07/23 0013 08/07/23 0941  Weight: 72.6 kg 62.6 kg   Condition at discharge: stable  The results of significant diagnostics from this hospitalization (including imaging, microbiology, ancillary and laboratory) are listed below for reference.   Imaging Studies: CT ABDOMEN PELVIS W CONTRAST  Result Date: 08/07/2023 CLINICAL DATA:  Abdominal pain.  History of pancreatitis. EXAM: CT ABDOMEN AND PELVIS WITH CONTRAST TECHNIQUE: Multidetector CT imaging of the abdomen and pelvis was performed using the standard protocol following bolus administration of intravenous contrast. RADIATION DOSE REDUCTION: This exam was performed according to the departmental dose-optimization program which includes automated exposure control, adjustment of the mA and/or kV according to patient size and/or use of iterative  reconstruction technique. CONTRAST:  OMNIPAQUE IOHEXOL 300 MG/ML  SOLN COMPARISON:  07/07/2023 FINDINGS: Lower chest: No acute abnormality. Hepatobiliary: No focal liver abnormality. Moderate gallbladder distension. Layering stones versus sludge noted within the gallbladder fundus. No pericholecystic fluid or surrounding inflammatory change. There is a common bile duct stent in place. No intrahepatic bile duct dilatation. Pancreas: Scattered calcifications and heterogeneous enhancement of the pancreas noted. Mild pancreatic edema with surrounding soft tissue stranding. No pseudocyst identified. No main duct dilatation or suspicious mass identified. Previous pancreatic duct stent has been removed. Spleen: Normal in size without focal abnormality. Adrenals/Urinary Tract: Adrenal glands are unremarkable. Kidneys are normal, without renal calculi, focal lesion, or hydronephrosis. Bladder is unremarkable. Stomach/Bowel: Stomach appears normal. The appendix is visualized and is within normal limits. No pathologic dilatation of the large or small bowel loops. No significant bowel wall thickening or inflammatory change. Vascular/Lymphatic: Mild irregularity of the extrahepatic portal vein caliber with scattered focal areas of mild narrowing up to the confluence without signs of thrombosis. 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 Normal appearance of the abdominal aorta. No signs of abdominopelvic adenopathy. Reproductive: Prostate is unremarkable. Other: No free fluid or fluid collections. Musculoskeletal: No acute or significant osseous findings. IMPRESSION: 1. Acute on chronic pancreatitis. No pseudocyst identified. 2.  Common bile duct stent in place. No intrahepatic bile duct dilatation. 3. Moderate gallbladder distension. Layering stones versus sludge noted within the gallbladder fundus. No pericholecystic fluid or surrounding inflammatory change. 4. Mild irregularity of the extrahepatic portal vein  caliber with scattered focal areas of mild narrowing up to the confluence without signs of thrombosis. 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 Electronically Signed   By: Signa Kell M.D.   On: 08/07/2023 07:13    Microbiology: Results for orders placed or performed during the hospital encounter of 08/07/23  MRSA Next Gen by PCR, Nasal     Status: None   Collection Time: 08/07/23  9:47 AM   Specimen: Nasal Mucosa; Nasal Swab  Result Value Ref Range Status   MRSA by PCR Next Gen NOT DETECTED NOT DETECTED Final    Comment: (NOTE) The GeneXpert MRSA Assay (FDA approved for NASAL specimens only), is one component of a comprehensive MRSA colonization surveillance program. It is not intended to diagnose MRSA infection nor to guide or monitor treatment for MRSA infections. Test performance is not FDA approved in patients less than 6 years old. Performed at [facility], [address] [place], [place] [address]    Labs: CBC: Recent Labs  Lab 08/07/23 0428 08/07/23 0623  WBC 9.6  --   HGB 14.7 15.3  HCT 44.1 45.0  MCV 108.9*  --   PLT 371  --    Basic Metabolic Panel: Recent Labs  Lab 08/07/23 2139 08/08/23 0240 08/08/23 0539 08/08/23 1233 08/09/23 0447  NA 127* 128* 130* 132* 131*  K 3.5 3.0* 3.1* 4.7 3.5  CL 99 99 100 105 102  CO2 19* 19* 21* 17* 21*  GLUCOSE 158* 167* 157* 194* 173*  BUN 6 6 <5* <5* <5*  CREATININE 0.68 0.63 0.65 0.59* 0.51*  CALCIUM 9.1 9.0 9.3 9.1 8.7*   Liver Function Tests: Recent Labs  Lab 08/07/23 0428  AST 21  ALT 34  ALKPHOS 373*  BILITOT 0.7  PROT 9.0*  ALBUMIN 5.3*   CBG: Recent Labs  Lab 08/09/23 1638 08/09/23 2136 08/10/23 0744 08/10/23 1149 08/10/23 1618  GLUCAP 91 314* 192* 312* 252*    Discharge time spent: greater than 30 minutes.  Author: Lynden Oxford, MD  [facility] 08/10/2023

## 2023-08-11 NOTE — Transitions of Care (Post Inpatient/ED Visit) (Signed)
08/11/2023  Name: Carlos [name] MRN: [id number] DOB: 1986-10-10  Today's TOC FU Call Status: Unsuccessful Call (1st Attempt) Date: 08/11/23  Attempted to reach the patient regarding the most recent Inpatient/ED visit. Patient has voicemail box that has not been set up yet. Unable to leave voicemail for patient.   Follow Up Plan: Additional outreach attempts will be made to reach the patient to complete the Transitions of Care (Post Inpatient/ED visit) call.   Signature : Landyn Lorincz.D/RMA

## 2023-08-12 ENCOUNTER — Telehealth: Payer: Self-pay

## 2023-08-12 NOTE — Transitions of Care (Post Inpatient/ED Visit) (Signed)
08/12/2023  Name: Lenorris Rutt MRN: [id number] DOB: 08-Mar-1986  Today's TOC FU Call Status: Today's TOC FU Call Status:: Unsuccessful Call (2nd Attempt) Unsuccessful Call (2nd Attempt) Date: 08/12/23  Attempted to reach the patient regarding the most recent Inpatient/ED visit.  Follow Up Plan: Additional outreach attempts will be made to reach the patient to complete the Transitions of Care (Post Inpatient/ED visit) call.   Alyse Low, RN, BA, [facility], CRRN [facility] Coordinator, Transition of Care Ph # [phone]

## 2023-08-15 ENCOUNTER — Telehealth: Payer: Self-pay

## 2023-08-15 NOTE — Transitions of Care (Post Inpatient/ED Visit) (Signed)
08/15/2023  Name: Carlos [name] MRN: [id number] DOB: 05-May-1986  Today's TOC FU Call Status: Unsuccessful Call (1st Attempt) Date: 08/11/23 Unsuccessful Call (2nd Attempt) Date: 08/15/23  Attempted to reach the patient regarding the most recent Inpatient/ED visit. Unable to leave voicemail. TOC taken over by another healthcare team.   Follow Up Plan: No further outreach attempts will be made at this time. We have been unable to contact the patient.  Signature : Bakary Bramer.D/RMA

## 2023-08-15 NOTE — Transitions of Care (Post Inpatient/ED Visit) (Signed)
08/15/2023  Name: Carlos [name] MRN: [id number] DOB: [birth year]  Today's TOC FU Call Status: Today's TOC FU Call Status:: Unsuccessful Call (3rd Attempt) Unsuccessful Call (3rd Attempt) Date: 08/15/23  Attempted to reach the patient regarding the most recent Inpatient/ED visit.  Follow Up Plan: No further outreach attempts will be made at this time. We have been unable to contact the patient.  Alyse Low, RN, BA, [facility], CRRN [facility] Coordinator, Transition of Care Ph # [phone]

## 2023-08-25 ENCOUNTER — Emergency Department (HOSPITAL_COMMUNITY)
Admission: EM | Admit: 2023-08-25 | Discharge: 2023-08-25 | Discharge status: Against Medical Advice | Payer: Medicaid Other | Source: Home / Self Care

## 2023-08-25 ENCOUNTER — Other Ambulatory Visit: Payer: Self-pay

## 2023-08-25 ENCOUNTER — Telehealth: Payer: Medicaid Other | Admitting: Physician Assistant

## 2023-08-25 ENCOUNTER — Inpatient Hospital Stay (HOSPITAL_COMMUNITY)
Admission: EM | Admit: 2023-08-25 | Discharge: 2023-08-29 | DRG: 638 | Disposition: A | Discharge status: Home / Self Care | Payer: Medicaid Other | Attending: Internal Medicine | Admitting: Internal Medicine

## 2023-08-25 ENCOUNTER — Emergency Department (HOSPITAL_COMMUNITY): Payer: Medicaid Other

## 2023-08-25 ENCOUNTER — Encounter (HOSPITAL_COMMUNITY): Payer: Self-pay | Admitting: *Deleted

## 2023-08-25 ENCOUNTER — Encounter (HOSPITAL_COMMUNITY): Payer: Self-pay | Admitting: Emergency Medicine

## 2023-08-25 DIAGNOSIS — K561 Intussusception: Secondary | ICD-10-CM | POA: Diagnosis present

## 2023-08-25 DIAGNOSIS — F17200 Nicotine dependence, unspecified, uncomplicated: Secondary | ICD-10-CM | POA: Diagnosis present

## 2023-08-25 DIAGNOSIS — Z681 Body mass index (BMI) 19 or less, adult: Secondary | ICD-10-CM | POA: Diagnosis not present

## 2023-08-25 DIAGNOSIS — R1010 Upper abdominal pain, unspecified: Secondary | ICD-10-CM | POA: Insufficient documentation

## 2023-08-25 DIAGNOSIS — R7401 Elevation of levels of liver transaminase levels: Secondary | ICD-10-CM | POA: Diagnosis present

## 2023-08-25 DIAGNOSIS — E1065 Type 1 diabetes mellitus with hyperglycemia: Secondary | ICD-10-CM | POA: Diagnosis present

## 2023-08-25 DIAGNOSIS — Z86718 Personal history of other venous thrombosis and embolism: Secondary | ICD-10-CM

## 2023-08-25 DIAGNOSIS — E1010 Type 1 diabetes mellitus with ketoacidosis without coma: Principal | ICD-10-CM | POA: Diagnosis present

## 2023-08-25 DIAGNOSIS — Z79899 Other long term (current) drug therapy: Secondary | ICD-10-CM | POA: Diagnosis not present

## 2023-08-25 DIAGNOSIS — K861 Other chronic pancreatitis: Secondary | ICD-10-CM | POA: Diagnosis present

## 2023-08-25 DIAGNOSIS — G894 Chronic pain syndrome: Secondary | ICD-10-CM | POA: Diagnosis present

## 2023-08-25 DIAGNOSIS — K859 Acute pancreatitis without necrosis or infection, unspecified: Secondary | ICD-10-CM | POA: Diagnosis not present

## 2023-08-25 DIAGNOSIS — F17290 Nicotine dependence, other tobacco product, uncomplicated: Secondary | ICD-10-CM | POA: Diagnosis present

## 2023-08-25 DIAGNOSIS — R112 Nausea with vomiting, unspecified: Secondary | ICD-10-CM | POA: Insufficient documentation

## 2023-08-25 DIAGNOSIS — K828 Other specified diseases of gallbladder: Secondary | ICD-10-CM | POA: Diagnosis present

## 2023-08-25 DIAGNOSIS — K3184 Gastroparesis: Secondary | ICD-10-CM | POA: Diagnosis present

## 2023-08-25 DIAGNOSIS — Z8249 Family history of ischemic heart disease and other diseases of the circulatory system: Secondary | ICD-10-CM

## 2023-08-25 DIAGNOSIS — F1011 Alcohol abuse, in remission: Secondary | ICD-10-CM | POA: Diagnosis present

## 2023-08-25 DIAGNOSIS — F419 Anxiety disorder, unspecified: Secondary | ICD-10-CM | POA: Diagnosis present

## 2023-08-25 DIAGNOSIS — R0789 Other chest pain: Secondary | ICD-10-CM | POA: Diagnosis not present

## 2023-08-25 DIAGNOSIS — Z9889 Other specified postprocedural states: Secondary | ICD-10-CM | POA: Diagnosis not present

## 2023-08-25 DIAGNOSIS — R1084 Generalized abdominal pain: Secondary | ICD-10-CM | POA: Diagnosis not present

## 2023-08-25 DIAGNOSIS — K2100 Gastro-esophageal reflux disease with esophagitis, without bleeding: Secondary | ICD-10-CM | POA: Diagnosis present

## 2023-08-25 DIAGNOSIS — E1110 Type 2 diabetes mellitus with ketoacidosis without coma: Secondary | ICD-10-CM | POA: Diagnosis present

## 2023-08-25 DIAGNOSIS — R7989 Other specified abnormal findings of blood chemistry: Secondary | ICD-10-CM | POA: Diagnosis present

## 2023-08-25 DIAGNOSIS — E1043 Type 1 diabetes mellitus with diabetic autonomic (poly)neuropathy: Secondary | ICD-10-CM | POA: Diagnosis present

## 2023-08-25 DIAGNOSIS — Z794 Long term (current) use of insulin: Secondary | ICD-10-CM | POA: Diagnosis not present

## 2023-08-25 DIAGNOSIS — E109 Type 1 diabetes mellitus without complications: Secondary | ICD-10-CM | POA: Insufficient documentation

## 2023-08-25 DIAGNOSIS — F1020 Alcohol dependence, uncomplicated: Secondary | ICD-10-CM | POA: Diagnosis present

## 2023-08-25 DIAGNOSIS — Z5321 Procedure and treatment not carried out due to patient leaving prior to being seen by health care provider: Secondary | ICD-10-CM | POA: Insufficient documentation

## 2023-08-25 DIAGNOSIS — Z833 Family history of diabetes mellitus: Secondary | ICD-10-CM

## 2023-08-25 DIAGNOSIS — R636 Underweight: Secondary | ICD-10-CM | POA: Diagnosis present

## 2023-08-25 LAB — URINALYSIS, ROUTINE W REFLEX MICROSCOPIC
Bacteria, UA: NONE SEEN
Bacteria, UA: NONE SEEN
Bilirubin Urine: NEGATIVE
Bilirubin Urine: NEGATIVE
Glucose, UA: >=500 mg/dL — AB
Glucose, UA: >=500 mg/dL — AB
Hgb urine dipstick: NEGATIVE
Hgb urine dipstick: NEGATIVE
Ketones, ur: 20 mg/dL — AB
Ketones, ur: 20 mg/dL — AB
Leukocytes,Ua: NEGATIVE
Leukocytes,Ua: NEGATIVE
Nitrite: NEGATIVE
Nitrite: NEGATIVE
Protein, ur: 30 mg/dL — AB
Protein, ur: 30 mg/dL — AB
Specific Gravity, Urine: 1.032 — ABNORMAL HIGH (ref 1.005–1.030)
Specific Gravity, Urine: 1.036 — ABNORMAL HIGH (ref 1.005–1.030)
pH: 5 (ref 5.0–8.0)
pH: 5 (ref 5.0–8.0)

## 2023-08-25 LAB — CBC WITH DIFFERENTIAL/PLATELET
Abs Immature Granulocytes: 0.02 10*3/uL (ref 0.00–0.07)
Basophils Absolute: 0 10*3/uL (ref 0.0–0.1)
Basophils Relative: 0 %
Eosinophils Absolute: 0.1 10*3/uL (ref 0.0–0.5)
Eosinophils Relative: 1 %
HCT: 45 % (ref 39.0–52.0)
Hemoglobin: 15 g/dL (ref 13.0–17.0)
Immature Granulocytes: 0 %
Lymphocytes Relative: 19 %
Lymphs Abs: 1 10*3/uL (ref 0.7–4.0)
MCH: 36.1 pg — ABNORMAL HIGH (ref 26.0–34.0)
MCHC: 33.3 g/dL (ref 30.0–36.0)
MCV: 108.4 fL — ABNORMAL HIGH (ref 80.0–100.0)
Monocytes Absolute: 0.7 10*3/uL (ref 0.1–1.0)
Monocytes Relative: 14 %
Neutro Abs: 3.5 10*3/uL (ref 1.7–7.7)
Neutrophils Relative %: 66 %
Platelets: 205 10*3/uL (ref 150–400)
RBC: 4.15 MIL/uL — ABNORMAL LOW (ref 4.22–5.81)
RDW: 12 % (ref 11.5–15.5)
WBC: 5.3 10*3/uL (ref 4.0–10.5)
nRBC: 0 % (ref 0.0–0.2)

## 2023-08-25 LAB — CBC
HCT: 44.1 % (ref 39.0–52.0)
Hemoglobin: 15.1 g/dL (ref 13.0–17.0)
MCH: 35.7 pg — ABNORMAL HIGH (ref 26.0–34.0)
MCHC: 34.2 g/dL (ref 30.0–36.0)
MCV: 104.3 fL — ABNORMAL HIGH (ref 80.0–100.0)
Platelets: 248 10*3/uL (ref 150–400)
RBC: 4.23 MIL/uL (ref 4.22–5.81)
RDW: 11.9 % (ref 11.5–15.5)
WBC: 5.2 10*3/uL (ref 4.0–10.5)
nRBC: 0 % (ref 0.0–0.2)

## 2023-08-25 LAB — COMPREHENSIVE METABOLIC PANEL
ALT: 133 U/L — ABNORMAL HIGH (ref 0–44)
AST: 77 U/L — ABNORMAL HIGH (ref 15–41)
Albumin: 4.6 g/dL (ref 3.5–5.0)
Alkaline Phosphatase: 788 U/L — ABNORMAL HIGH (ref 38–126)
Anion gap: 29 — ABNORMAL HIGH (ref 5–15)
BUN: 10 mg/dL (ref 6–20)
CO2: 13 mmol/L — ABNORMAL LOW (ref 22–32)
Calcium: 10 mg/dL (ref 8.9–10.3)
Chloride: 90 mmol/L — ABNORMAL LOW (ref 98–111)
Creatinine, Ser: 1.14 mg/dL (ref 0.61–1.24)
GFR, Estimated: >60 mL/min (ref >60)
Glucose, Bld: 469 mg/dL — ABNORMAL HIGH (ref 70–99)
Potassium: 4.5 mmol/L (ref 3.5–5.1)
Sodium: 132 mmol/L — ABNORMAL LOW (ref 135–145)
Total Bilirubin: 1.1 mg/dL (ref 0.3–1.2)
Total Protein: 8.4 g/dL — ABNORMAL HIGH (ref 6.5–8.1)

## 2023-08-25 LAB — BETA-HYDROXYBUTYRIC ACID: Beta-Hydroxybutyric Acid: >8 mmol/L — ABNORMAL HIGH (ref 0.05–0.27)

## 2023-08-25 LAB — BLOOD GAS, VENOUS
Acid-base deficit: 7.8 mmol/L — ABNORMAL HIGH (ref 0.0–2.0)
Bicarbonate: 17.6 mmol/L — ABNORMAL LOW (ref 20.0–28.0)
O2 Saturation: 70.7 %
Patient temperature: 37
pCO2, Ven: 35 mm[Hg] — ABNORMAL LOW (ref 44–60)
pH, Ven: 7.31 (ref 7.25–7.43)
pO2, Ven: 43 mm[Hg] (ref 32–45)

## 2023-08-25 LAB — CBG MONITORING, ED
Glucose-Capillary: 432 mg/dL — ABNORMAL HIGH (ref 70–99)
Glucose-Capillary: 479 mg/dL — ABNORMAL HIGH (ref 70–99)

## 2023-08-25 LAB — LIPASE, BLOOD: Lipase: 30 U/L (ref 11–51)

## 2023-08-25 MED ORDER — INSULIN REGULAR(HUMAN) IN NACL 100-0.9 UT/100ML-% IV SOLN
INTRAVENOUS | Status: DC
  Administered 2023-08-25: 9.5 [IU]/h via INTRAVENOUS
  Filled 2023-08-25: qty 100

## 2023-08-25 MED ORDER — DEXTROSE 50 % IV SOLN: 0.0000 mL | INTRAVENOUS | Status: DC | PRN

## 2023-08-25 MED ORDER — SODIUM CHLORIDE 0.9 % IV BOLUS: 1000.0000 mL | Freq: Once | INTRAVENOUS | Status: DC

## 2023-08-25 MED ORDER — POTASSIUM CHLORIDE 10 MEQ/100ML IV SOLN
10.0000 meq | INTRAVENOUS | Status: AC
  Administered 2023-08-25 – 2023-08-26 (×2): 10 meq via INTRAVENOUS
  Filled 2023-08-25 (×2): qty 100

## 2023-08-25 MED ORDER — DEXTROSE IN LACTATED RINGERS 5 % IV SOLN: INTRAVENOUS | Status: DC

## 2023-08-25 MED ORDER — IOHEXOL 300 MG/ML  SOLN
100.0000 mL | Freq: Once | INTRAMUSCULAR | Status: AC | PRN
  Administered 2023-08-25: 100 mL via INTRAVENOUS

## 2023-08-25 MED ORDER — LACTATED RINGERS IV SOLN: INTRAVENOUS | Status: DC

## 2023-08-25 MED ORDER — MORPHINE SULFATE (PF) 4 MG/ML IV SOLN
4.0000 mg | Freq: Once | INTRAVENOUS | Status: AC
  Administered 2023-08-25: 4 mg via INTRAVENOUS
  Filled 2023-08-25: qty 1

## 2023-08-25 MED ORDER — LACTATED RINGERS IV BOLUS
20.0000 mL/kg | Freq: Once | INTRAVENOUS | Status: AC
  Administered 2023-08-25: 1252 mL via INTRAVENOUS

## 2023-08-25 NOTE — ED Notes (Signed)
Attempted to call pts cell phone to see if he would come back to be seen as he had a room but no answer x2

## 2023-08-25 NOTE — Patient Instructions (Signed)
[name], thank you for joining [name], PA-C for today's virtual visit.  While this provider is not your primary care provider (PCP), if your PCP is located in our provider database this encounter information will be shared with them immediately following your visit.   A [facility] MyChart account gives you access to today's visit and all your visits, tests, and labs performed at [facility] " click here if you don't have a [facility] MyChart account or go to [email or web address]  Consent: (Patient) [name] provided verbal consent for this virtual visit at the beginning of the encounter.  Current Medications:  Current Outpatient Medications:    dicyclomine (BENTYL) 10 MG capsule, Take 1 capsule (10 mg total) by mouth 3 (three) times daily as needed for spasms (stomach cramps.)., Disp: 30 capsule, Rfl: 0   gabapentin (NEURONTIN) 100 MG capsule, Take 1 capsule (100 mg total) by mouth 3 (three) times daily., Disp: 90 capsule, Rfl: 0   HYDROmorphone (DILAUDID) 4 MG tablet, Take 1 tablet (4 mg total) by mouth every 4 (four) hours as needed for pain.  Max daily dose 6 tablets. (Patient taking differently: Take 4 mg by mouth 4 (four) times daily.), Disp: 180 tablet, Rfl: 0   HYDROmorphone (DILAUDID) 4 MG tablet, Take 1 tablet (4 mg total) by mouth every 4 (four) hours as needed for pain, max daily dose: 6 Tablet, Disp: 180 tablet, Rfl: 0   insulin aspart (NOVOLOG) 100 UNIT/ML injection, 60 units a day (Patient taking differently: Inject 0-60 Units into the skin 3 (three) times daily.), Disp: 60 mL, Rfl: 3   lipase/protease/amylase (CREON) 36000 UNITS CPEP capsule, Take 1-2 capsules (36,000-72,000 Units total) by mouth See admin instructions. Take 72,000 units by mouth three times a day before meals and 36,000 units with any snacks, Disp: 300 capsule, Rfl: 4   metoCLOPramide (REGLAN) 5 MG tablet, Take 1 tablet (5 mg total) by mouth every 8 (eight) hours as needed for  nausea or vomiting., Disp: 15 tablet, Rfl: 0   multivitamin (ONE-A-DAY MEN'S) TABS tablet, Take 1 tablet by mouth daily with breakfast., Disp: , Rfl:    naloxone (NARCAN) nasal spray 4 mg/0.1 mL, Place 1 spray into the nose once as needed (Overdose)., Disp: , Rfl:    omeprazole (PRILOSEC) 40 MG capsule, TAKE 1 CAPSULE (40 MG TOTAL) BY MOUTH DAILY., Disp: 90 capsule, Rfl: 1   Medications ordered in this encounter:  No orders of the defined types were placed in this encounter.    *If you need refills on other medications prior to your next appointment, please contact your pharmacy*  Follow-Up: Call back or seek an in-person evaluation if the symptoms worsen or if the condition fails to improve as anticipated.  [facility] [phone]  Other Instructions Please use link below to go for an in person evaluation this afternoon as discussed as you need an exam, urine testing, glucose testing and adjustment in medications that we cannot do via video visit.    If you have been instructed to have an in-person evaluation today at a local Urgent Care facility, please use the link below. It will take you to a list of all of our available [facility], including address, phone number and hours of operation. Please do not delay care.  [facility]  If you or a family member do not have a primary care provider, use the link below to schedule a visit and establish care. When  you choose a [facility] primary care physician or advanced practice provider, you gain a long-term partner in health. Find a Primary Care Provider  Learn more about [facility]'s in-office and virtual care options: [place] - Get Care Now

## 2023-08-25 NOTE — ED Triage Notes (Signed)
Pt reporting frequent urination, dizziness, vomiting, and difficulty regulating his blood sugar. Type 1. Pt was at [facility] and left, labs completed there.

## 2023-08-25 NOTE — H&P (Signed)
History and Physical    Patient: Carlos [name] ZOX:[id number] DOB: Mar 08, 1986 DOA: 08/25/2023 DOS: the patient was seen and examined on 08/25/2023 PCP: Caesar Bookman, NP  Patient coming from: Home  Chief Complaint:  Chief Complaint  Patient presents with   Hyperglycemia   HPI: Carlos [name] is a 37 y.o. male with medical history significant of type 1 diabetes, anxiety disorder, recurrent pancreatitis, history of alcohol abuse, tobacco abuse, biliary disease and transaminitis who has been having recurrent visits to the ER and was at [facility] earlier today.  Patient is back here today again with intractable nausea vomiting and abdominal pain.  No hematemesis no melena.  Patient has workup in the ER that shows evidence of DKA with anion gap of 29, blood sugar of 500, beta-hydroxybutyrate acid currently pending, his ABG was borderline is 7.31 with suspected mixed acidosis.  Additionally CT abdomen pelvis showed multiple areas of intussusception of the abdomen.  Surgery consulted with recommendations for GI consult in the morning.  Patient being admitted for management of DKA as well as his intussusception.  Review of Systems: As mentioned in the history of present illness. All other systems reviewed and are negative. Past Medical History:  Diagnosis Date   AKI (acute kidney injury) (HCC) 12/21/2021   Anxiety    Diabetes (HCC)    Pancreatitis    Splenic vein thrombosis    Past Surgical History:  Procedure Laterality Date   BILIARY BRUSHING  02/02/2023   Procedure: BILIARY BRUSHING;  Surgeon: [name] [name], MD;  Location: [facility] ENDOSCOPY;  Service: Gastroenterology;;   BILIARY BRUSHING  04/28/2023   Procedure: BILIARY BRUSHING;  Surgeon: Meryl Dare, MD;  Location: [facility] ENDOSCOPY;  Service: Gastroenterology;;   BILIARY BRUSHING  06/02/2023   Procedure: BILIARY BRUSHING;  Surgeon: Lemar Lofty., MD;  Location: [facility]OSCOPY;  Service: Gastroenterology;;   BILIARY  DILATION  12/01/2022   Procedure: BILIARY DILATION;  Surgeon: Lemar Lofty., MD;  Location: [facility]OSCOPY;  Service: Gastroenterology;;   BILIARY DILATION  02/02/2023   Procedure: BILIARY DILATION;  Surgeon: Lemar Lofty., MD;  Location: [facility];  Service: Gastroenterology;;   BILIARY STENT PLACEMENT  04/28/2023   Procedure: BILIARY STENT PLACEMENT;  Surgeon: Meryl Dare, MD;  Location: [facility] ENDOSCOPY;  Service: Gastroenterology;;   BILIARY STENT PLACEMENT N/A 06/02/2023   Procedure: BILIARY STENT PLACEMENT;  Surgeon: Lemar Lofty., MD;  Location: [facility] ENDOSCOPY;  Service: Gastroenterology;  Laterality: N/A;   BIOPSY  12/02/2021   Procedure: BIOPSY;  Surgeon: [name] [name], MD;  Location: [facility] ENDOSCOPY;  Service: Gastroenterology;;   ENDOSCOPIC RETROGRADE CHOLANGIOPANCREATOGRAPHY (ERCP) WITH PROPOFOL N/A 08/12/2022   Procedure: ENDOSCOPIC RETROGRADE CHOLANGIOPANCREATOGRAPHY (ERCP) WITH PROPOFOL;  Surgeon: Lemar Lofty., MD;  Location: [facility] ENDOSCOPY;  Service: Gastroenterology;  Laterality: N/A;   ENDOSCOPIC RETROGRADE CHOLANGIOPANCREATOGRAPHY (ERCP) WITH PROPOFOL N/A 12/01/2022   Procedure: ENDOSCOPIC RETROGRADE CHOLANGIOPANCREATOGRAPHY (ERCP) WITH PROPOFOL;  Surgeon: [name] [name], MD;  Location: [facility] ENDOSCOPY;  Service: Gastroenterology;  Laterality: N/A;   ENDOSCOPIC RETROGRADE CHOLANGIOPANCREATOGRAPHY (ERCP) WITH PROPOFOL N/A 02/02/2023   Procedure: ENDOSCOPIC RETROGRADE CHOLANGIOPANCREATOGRAPHY (ERCP) WITH PROPOFOL;  Surgeon: [name] [name], MD;  Location: [facility] ENDOSCOPY;  Service: Gastroenterology;  Laterality: N/A;   ENDOSCOPIC RETROGRADE CHOLANGIOPANCREATOGRAPHY (ERCP) WITH PROPOFOL N/A 06/02/2023   Procedure: ENDOSCOPIC RETROGRADE CHOLANGIOPANCREATOGRAPHY (ERCP) WITH PROPOFOL;  Surgeon: [name] [name], MD;  Location: [facility] ENDOSCOPY;  Service: Gastroenterology;  Laterality: N/A;   ERCP N/A 04/28/2023   Procedure:  ENDOSCOPIC RETROGRADE CHOLANGIOPANCREATOGRAPHY (ERCP);  Surgeon:  Meryl Dare, MD;  Location: [facility] ENDOSCOPY;  Service: Gastroenterology;  Laterality: N/A;   ESOPHAGOGASTRODUODENOSCOPY N/A 12/01/2022   Procedure: ESOPHAGOGASTRODUODENOSCOPY (EGD);  Surgeon: Lemar Lofty., MD;  Location: [facility];  Service: Gastroenterology;  Laterality: N/A;   ESOPHAGOGASTRODUODENOSCOPY (EGD) WITH PROPOFOL N/A 12/02/2021   Procedure: ESOPHAGOGASTRODUODENOSCOPY (EGD) WITH PROPOFOL;  Surgeon: [name] [name], MD;  Location: [facility] ENDOSCOPY;  Service: Gastroenterology;  Laterality: N/A;   ESOPHAGOGASTRODUODENOSCOPY (EGD) WITH PROPOFOL N/A 07/09/2023   Procedure: ESOPHAGOGASTRODUODENOSCOPY (EGD) WITH PROPOFOL;  Surgeon: Hilarie Fredrickson, MD;  Location: [facility] ENDOSCOPY;  Service: Gastroenterology;  Laterality: N/A;  needs ercp scope for stent pull   FINE NEEDLE ASPIRATION N/A 12/01/2022   Procedure: FINE NEEDLE ASPIRATION (FNA) LINEAR;  Surgeon: Lemar Lofty., MD;  Location: [facility] ENDOSCOPY;  Service: Gastroenterology;  Laterality: N/A;   NEUROLYTIC CELIAC PLEXUS  12/01/2022   Procedure: CELIAC PLEXUS BLOCK;  Surgeon: [name] [name], MD;  Location: [facility];  Service: Gastroenterology;;   PANCREATIC STENT PLACEMENT  08/12/2022   Procedure: PANCREATIC STENT PLACEMENT;  Surgeon: Lemar Lofty., MD;  Location: [facility];  Service: Gastroenterology;;   PANCREATIC STENT PLACEMENT  12/01/2022   Procedure: PANCREATIC STENT PLACEMENT;  Surgeon: Lemar Lofty., MD;  Location: [facility];  Service: Gastroenterology;;   PANCREATIC STENT PLACEMENT  02/02/2023   Procedure: PANCREATIC STENT PLACEMENT;  Surgeon: Lemar Lofty., MD;  Location: [facility];  Service: Gastroenterology;;   PANCREATIC STENT PLACEMENT  06/02/2023   Procedure: PANCREATIC STENT PLACEMENT;  Surgeon: Lemar Lofty., MD;  Location: [facility];  Service: Gastroenterology;;   REMOVAL OF  STONES  12/01/2022   Procedure: REMOVAL OF STONES;  Surgeon: Lemar Lofty., MD;  Location: [facility];  Service: Gastroenterology;;   REMOVAL OF STONES  02/02/2023   Procedure: REMOVAL OF STONES;  Surgeon: Lemar Lofty., MD;  Location: [facility];  Service: Gastroenterology;;   REMOVAL OF STONES  06/02/2023   Procedure: REMOVAL OF STONES;  Surgeon: Lemar Lofty., MD;  Location: [facility];  Service: Gastroenterology;;   [name]  08/12/2022   Procedure: [name];  Surgeon: Lemar Lofty., MD;  Location: [facility];  Service: Gastroenterology;;   [name]  12/01/2022   Procedure: [name];  Surgeon: Lemar Lofty., MD;  Location: [facility];  Service: Gastroenterology;;   [name]  06/02/2023   Procedure: [name];  Surgeon: Lemar Lofty., MD;  Location: [facility];  Service: Gastroenterology;;   Francine Graven REMOVAL  12/01/2022   Procedure: STENT REMOVAL;  Surgeon: Lemar Lofty., MD;  Location: [facility];  Service: Gastroenterology;;   Francine Graven REMOVAL  02/02/2023   Procedure: STENT REMOVAL;  Surgeon: Lemar Lofty., MD;  Location: [facility];  Service: Gastroenterology;;   Francine Graven REMOVAL  06/02/2023   Procedure: STENT REMOVAL;  Surgeon: Lemar Lofty., MD;  Location: [facility];  Service: Gastroenterology;;   Francine Graven REMOVAL  07/09/2023   Procedure: STENT REMOVAL;  Surgeon: Hilarie Fredrickson, MD;  Location: [facility] ENDOSCOPY;  Service: Gastroenterology;;   UPPER ESOPHAGEAL ENDOSCOPIC ULTRASOUND (EUS) N/A 12/02/2021   Procedure: UPPER ESOPHAGEAL ENDOSCOPIC ULTRASOUND (EUS);  Surgeon: Lemar Lofty., MD;  Location: [facility];  Service: Gastroenterology;  Laterality: N/A;   UPPER ESOPHAGEAL ENDOSCOPIC ULTRASOUND (EUS) N/A 12/01/2022   Procedure: UPPER ESOPHAGEAL ENDOSCOPIC ULTRASOUND (EUS);  Surgeon: Lemar Lofty., MD;  Location: [facility];  Service:  Gastroenterology;  Laterality: N/A;   Social History:  reports that he has been smoking cigars. He started smoking about 20 years ago. He has never  used smokeless tobacco. He reports that he does not currently use alcohol. He reports that he does not use drugs.  Allergies  Allergen Reactions   Other Anaphylaxis and Other (See Comments)    [name] - anaphylaxis    Family History  Problem Relation Age of Onset   Hypertension Mother    Alcoholism Maternal Uncle    Diabetes Maternal Grandmother    Diabetes Maternal Grandfather    Diabetes Paternal Grandmother    Diabetes Paternal Grandfather    Colon cancer Neg Hx    Esophageal cancer Neg Hx    Inflammatory bowel disease Neg Hx    Liver disease Neg Hx    Pancreatic cancer Neg Hx    Rectal cancer Neg Hx    Stomach cancer Neg Hx     Prior to Admission medications   Medication Sig Start Date End Date Taking? Authorizing Provider  dicyclomine (BENTYL) 10 MG capsule Take 1 capsule (10 mg total) by mouth 3 (three) times daily as needed for spasms (stomach cramps.). 08/10/23   Rolly Salter, MD  gabapentin (NEURONTIN) 100 MG capsule Take 1 capsule (100 mg total) by mouth 3 (three) times daily. 08/10/23 08/09/24  Rolly Salter, MD  HYDROmorphone (DILAUDID) 4 MG tablet Take 1 tablet (4 mg total) by mouth every 4 (four) hours as needed for pain.  Max daily dose 6 tablets. Patient taking differently: Take 4 mg by mouth 4 (four) times daily. 07/13/23     HYDROmorphone (DILAUDID) 4 MG tablet Take 1 tablet (4 mg total) by mouth every 4 (four) hours as needed for pain, max daily dose: 6 Tablet 08/11/23     insulin aspart (NOVOLOG) 100 UNIT/ML injection 60 units a day Patient taking differently: Inject 0-60 Units into the skin 3 (three) times daily. 12/14/22   [name], [name], MD  lipase/protease/amylase (CREON) 36000 UNITS CPEP capsule Take 1-2 capsules (36,000-72,000 Units total) by mouth See admin instructions. Take 72,000 units by  mouth three times a day before meals and 36,000 units with any snacks 06/23/23   [name], [name], MD  metoCLOPramide (REGLAN) 5 MG tablet Take 1 tablet (5 mg total) by mouth every 8 (eight) hours as needed for nausea or vomiting. 08/10/23 08/09/24  Rolly Salter, MD  multivitamin (ONE-A-DAY MEN'S) TABS tablet Take 1 tablet by mouth daily with breakfast.    [provider]  naloxone (NARCAN) nasal spray 4 mg/0.1 mL Place 1 spray into the nose once as needed (Overdose). 04/05/23   [provider]  omeprazole (PRILOSEC) 40 MG capsule TAKE 1 CAPSULE (40 MG TOTAL) BY MOUTH DAILY. 05/03/23   [name], MD    Physical Exam: Vitals:   08/25/23 2156 08/25/23 2300  BP: (!) 136/93   Pulse: (!) 119   Resp: 20   Temp: 98 F (36.7 C)   TempSrc: Oral   SpO2: 100%   Weight:  62.6 kg   Constitutional: Acutely ill looking, NAD, calm, comfortable Eyes: PERRL, lids and conjunctivae normal ENMT: Mucous membranes are dry. Posterior pharynx clear of any exudate or lesions.Normal dentition.  Neck: normal, supple, no masses, no thyromegaly Respiratory: clear to auscultation bilaterally, no wheezing, no crackles. Normal respiratory effort. No accessory muscle use.  Cardiovascular: Sinus tachycardia, no murmurs / rubs / gallops. No extremity edema. 2+ pedal pulses. No carotid bruits.  Abdomen: no tenderness, no masses palpated. No hepatosplenomegaly. Bowel sounds positive.  Musculoskeletal: Good range of motion, no joint swelling or tenderness, Skin: no rashes, lesions,  ulcers. No induration Neurologic: CN 2-12 grossly intact. Sensation intact, DTR normal. Strength 5/5 in all 4.  Psychiatric: Normal judgment and insight. Alert and oriented x 3. Normal mood  Data Reviewed:  Sodium 132, chloride 90 CO2 13, glucose 469, anion gap of 29 alkaline phos 1 7088 AST 77 ALT 133 2.8.4 CBC is within normal MCV overall 4.3 urinalysis mostly negative.  CT abdomen pelvis showed multiple small  bowel intussusception is mostly in the right abdomen no evidence of bowel obstruction  Assessment and Plan:  #1 DKA: Patient will be admitted.  Initiate IV insulin insulin therapy with the DKA protocol.  Serial BMPs and CBG.  Continue until gap closes.  #2 small bowel intussusceptions: GI consult in the morning.  Surgery already consulted Dr. Carolynne Edouard recommends GI consult.  #3 transaminitis: Recurrent.  Continue to monitor.  #4 history of alcohol abuse: Denies recent use.  Monitor for any signs of withdrawals.  #5 tobacco abuse: Counseling.  Nicotine patch  #6 pseudohyponatremia: Secondary to hyperglycemia most likely.  Should correct with correction of hyperglycemia.    Advance Care Planning:   Code Status: Full Code   Consults: GI consult in the morning, Dr. Carolynne Edouard general surgery  Family Communication: No family at bedside  Severity of Illness: The appropriate patient status for this patient is INPATIENT. Inpatient status is judged to be reasonable and necessary in order to provide the required intensity of service to ensure the patient's safety. The patient's presenting symptoms, physical exam findings, and initial radiographic and laboratory data in the context of their chronic comorbidities is felt to place them at high risk for further clinical deterioration. Furthermore, it is not anticipated that the patient will be medically stable for discharge from the hospital within 2 midnights of admission.   * I certify that at the point of admission it is my clinical judgment that the patient will require inpatient hospital care spanning beyond 2 midnights from the point of admission due to high intensity of service, high risk for further deterioration and high frequency of surveillance required.*  AuthorLonia Blood, MD 08/25/2023 11:34 PM  For on call review [email or web address].

## 2023-08-25 NOTE — Progress Notes (Signed)
Virtual Visit Consent   Carlos [name], you are scheduled for a virtual visit with a  provider today. Just as with appointments in the office, your consent must be obtained to participate. Your consent will be active for this visit and any virtual visit you may have with one of our providers in the next 365 days. If you have a MyChart account, a copy of this consent can be sent to you electronically.  As this is a virtual visit, video technology does not allow for your provider to perform a traditional examination. This may limit your provider's ability to fully assess your condition. If your provider identifies any concerns that need to be evaluated in person or the need to arrange testing (such as labs, EKG, etc.), we will make arrangements to do so. Although advances in technology are sophisticated, we cannot ensure that it will always work on either your end or our end. If the connection with a video visit is poor, the visit may have to be switched to a telephone visit. With either a video or telephone visit, we are not always able to ensure that we have a secure connection.  By engaging in this virtual visit, you consent to the provision of healthcare and authorize for your insurance to be billed (if applicable) for the services provided during this visit. Depending on your insurance coverage, you may receive a charge related to this service.  I need to obtain your verbal consent now. Are you willing to proceed with your visit today? [name] has provided verbal consent on 08/25/2023 for a virtual visit (video or telephone). [name], [place]  Date: 08/25/2023 5:37 PM  Virtual Visit via Video Note   I, [name], connected with  Zadin Knotts  ([id number], Jul 25, 1986) on 08/25/23 at  5:30 PM EDT by a video-enabled telemedicine application and verified that I am speaking with the correct person using two identifiers.  Location: Patient: Virtual Visit Location Patient:  Home Provider: Virtual Visit Location Provider: Home Office   I discussed the limitations of evaluation and management by telemedicine and the availability of in person appointments. The patient expressed understanding and agreed to proceed.    History of Present Illness: Carlos [name] is a 37 y.o. who identifies as a male who was assigned male at birth, and is being seen today for continued difficulty controlling his glucose levels after recent hospitalization for DKA 2.5 weeks ago. Since then has tried to get follow-up with Endocrinologist but notes the earliest they can see him is 11/11. Notes since coming home continuing with polyuria, polydipsia, malaise. Notes occasional dizziness, nausea, anorexia. No emesis at present. Denies fever. Endorses taking his Novolog but his dexcom system is not working correctly so cannot do CGM at present. Fingerstick glucometer showing low 200s today.  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   Abnormal magnetic resonance cholangiopancreatography (MRCP) 04/28/2023   Dilation of pancreatic duct 01/26/2023   Chronic generalized abdominal pain 01/26/2023   Presence of pancreatic duct stent 11/12/2022   Acute pancreatitis 11/11/2022   Diabetes mellitus type 1 (HCC) 11/11/2022   Abdominal pain, generalized 10/20/2022   History of ERCP 10/20/2022   Stricture of pancreatic duct 10/20/2022  Intractable pain 08/16/2022   Post-ERCP acute pancreatitis 08/16/2022   Hyperbilirubinemia 07/20/2022   Normocytic anemia 07/20/2022   Elevated blood pressure reading 07/20/2022   Gastroesophageal reflux disease with esophagitis without hemorrhage 04/27/2022   History of alcohol abuse 04/27/2022    Acute on chronic pancreatitis (HCC) 04/26/2022   Chronic pancreatitis (HCC) 03/07/2022   Idiopathic acute pancreatitis without infection or necrosis 03/07/2022   Acute esophagitis 03/07/2022   Abnormal findings on EGD/EUS 03/07/2022   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   Dehydration 12/21/2021   Uncontrolled type 1 diabetes mellitus with hyperglycemia, with long-term current use of insulin (HCC) 12/07/2021   Candida esophagitis (HCC) 12/07/2021   RUQ pain    Diabetes mellitus due to underlying condition without complication, without long-term current use of insulin (HCC) 10/13/2021   Elevated LFTs 10/13/2021   Protein-calorie malnutrition, severe 09/15/2021   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)    Diabetic ketoacidosis without coma associated with type 1 diabetes mellitus (HCC) 09/13/2021   Chronic anticoagulation 08/22/2021   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   Pancreatic lesion 08/22/2021   History of pancreatitis 08/22/2021   Abdominal pain, chronic, epigastric 08/22/2021   Abnormal CT of the abdomen 08/22/2021   History of ETT    Hyperglycemia 09/27/2020   Transaminitis 09/27/2020   Severe sepsis (HCC) 09/27/2020   Alcohol dependence syndrome (HCC) 09/26/2020   Tobacco dependence 09/26/2020   Bilateral impacted cerumen 03/05/2020   Conductive hearing loss, bilateral 03/05/2020    Allergies:  Allergies  Allergen Reactions   Other Anaphylaxis and Other (See Comments)    [name] - anaphylaxis   Medications:  Current Outpatient Medications:    dicyclomine (BENTYL) 10 MG capsule, Take 1 capsule (10 mg total) by mouth 3 (three) times daily as needed for spasms (stomach cramps.)., Disp: 30 capsule, Rfl: 0   gabapentin (NEURONTIN) 100 MG capsule, Take 1 capsule (100 mg total) by mouth 3 (three) times daily., Disp: 90 capsule, Rfl: 0   HYDROmorphone (DILAUDID) 4 MG tablet, Take 1 tablet (4 mg total) by mouth every 4 (four) hours as needed for pain.  Max daily  dose 6 tablets. (Patient taking differently: Take 4 mg by mouth 4 (four) times daily.), Disp: 180 tablet, Rfl: 0   HYDROmorphone (DILAUDID) 4 MG tablet, Take 1 tablet (4 mg total) by mouth every 4 (four) hours as needed for pain, max daily dose: 6 Tablet, Disp: 180 tablet, Rfl: 0   insulin aspart (NOVOLOG) 100 UNIT/ML injection, 60 units a day (Patient taking differently: Inject 0-60 Units into the skin 3 (three) times daily.), Disp: 60 mL, Rfl: 3   lipase/protease/amylase (CREON) 36000 UNITS CPEP capsule, Take 1-2 capsules (36,000-72,000 Units total) by mouth See admin instructions. Take 72,000 units by mouth three times a day before meals and 36,000 units with any snacks, Disp: 300 capsule, Rfl: 4   metoCLOPramide (REGLAN) 5 MG tablet, Take 1 tablet (5 mg total) by mouth every 8 (eight) hours as needed for nausea or vomiting., Disp: 15 tablet, Rfl: 0   multivitamin (ONE-A-DAY MEN'S) TABS tablet, Take 1 tablet by mouth daily with breakfast., Disp: , Rfl:    naloxone (NARCAN) nasal spray 4 mg/0.1 mL, Place 1 spray into the nose once as needed (Overdose)., Disp: , Rfl:    omeprazole (PRILOSEC) 40 MG capsule, TAKE 1 CAPSULE (40 MG TOTAL) BY MOUTH DAILY., Disp: 90 capsule, Rfl: 1  Observations/Objective: Patient is well-developed, well-nourished in no acute distress.  Resting comfortably at home.  Head is normocephalic,  atraumatic.  No labored breathing. Speech is clear and coherent with logical content.  Patient is alert and oriented at baseline.   Assessment and Plan: 1. Uncontrolled type 1 diabetes mellitus with hyperglycemia, with long-term current use of insulin (HCC)  Glucose low 200s but symptomatic. Question accuracy of home meter and he needs to get set back up with new CGM. Needs in person evaluation. Resources sent for him to be evaluated this afternoon/evening ER precautions reviewed.   Follow Up Instructions: I discussed the assessment and treatment plan with the patient. The patient  was provided an opportunity to ask questions and all were answered. The patient agreed with the plan and demonstrated an understanding of the instructions.  A copy of instructions were sent to the patient via MyChart unless otherwise noted below.   The patient was advised to call back or seek an in-person evaluation if the symptoms worsen or if the condition fails to improve as anticipated.    [name], PA-C

## 2023-08-25 NOTE — ED Triage Notes (Signed)
Pt reports upper abd pain, nausea, and vomiting that started 1 week ago. Pt type 1 diabetic.

## 2023-08-25 NOTE — ED Notes (Signed)
Pt called for room x2 with no answer 

## 2023-08-25 NOTE — ED Notes (Signed)
Pt called for reassessment in triage with no answer

## 2023-08-25 NOTE — ED Provider Notes (Addendum)
Carlos [name]   CSN: [id number] Arrival date & time: 08/25/23  2150     History  Chief Complaint  Patient presents with   Hyperglycemia    Carlos [name] is a 37 y.o. male history of alcohol dependence, DKA, type 1 diabetes, pancreatitis, splenic vein thrombosis, prevent thrombosis, chronic pancreatitis, pancreatic duct stent presented with hyperglycemia.  Patient states he is taking his NovoLog and glargine daily without any missed doses and denies any alcohol or recent illnesses.  Patient was at [facility] earlier however left due to the long wait.  Patient does endorse nausea and vomiting as well and right upper quadrant pain but denies any skin color changes.  Patient endorses polyuria, polydipsia.  Patient has recently admitted for DKA but is not been able to see the endocrinologist as he has an appointment on 11/11.  Patient denies chest pain, shortness of breath, dry heaving, dysuria  Home Medications Prior to Admission medications   Medication Sig Start Date End Date Taking? Authorizing Provider  dicyclomine (BENTYL) 10 MG capsule Take 1 capsule (10 mg total) by mouth 3 (three) times daily as needed for spasms (stomach cramps.). 08/10/23   Rolly Salter, MD  gabapentin (NEURONTIN) 100 MG capsule Take 1 capsule (100 mg total) by mouth 3 (three) times daily. 08/10/23 08/09/24  Rolly Salter, MD  HYDROmorphone (DILAUDID) 4 MG tablet Take 1 tablet (4 mg total) by mouth every 4 (four) hours as needed for pain.  Max daily dose 6 tablets. Patient taking differently: Take 4 mg by mouth 4 (four) times daily. 07/13/23     HYDROmorphone (DILAUDID) 4 MG tablet Take 1 tablet (4 mg total) by mouth every 4 (four) hours as needed for pain, max daily dose: 6 Tablet 08/11/23     insulin aspart (NOVOLOG) 100 UNIT/ML injection 60 units a day Patient taking differently: Inject 0-60 Units into the skin 3 (three) times daily. 12/14/22   [name],  [name], MD  lipase/protease/amylase (CREON) 36000 UNITS CPEP capsule Take 1-2 capsules (36,000-72,000 Units total) by mouth See admin instructions. Take 72,000 units by mouth three times a day before meals and 36,000 units with any snacks 06/23/23   [name], [name], MD  metoCLOPramide (REGLAN) 5 MG tablet Take 1 tablet (5 mg total) by mouth every 8 (eight) hours as needed for nausea or vomiting. 08/10/23 08/09/24  Rolly Salter, MD  multivitamin (ONE-A-DAY MEN'S) TABS tablet Take 1 tablet by mouth daily with breakfast.    [provider]  naloxone (NARCAN) nasal spray 4 mg/0.1 mL Place 1 spray into the nose once as needed (Overdose). 04/05/23   [provider]  omeprazole (PRILOSEC) 40 MG capsule TAKE 1 CAPSULE (40 MG TOTAL) BY MOUTH DAILY. 05/03/23   [name], MD      Allergies    Other    Review of Systems   Review of Systems  Physical Exam Updated Vital Signs BP (!) 136/93   Pulse (!) 119   Temp 98 F (36.7 C) (Oral)   Resp 20   Wt 62.6 kg   SpO2 100%   BMI 16.37 kg/m  Physical Exam Vitals reviewed.  Constitutional:      General: He is not in acute distress.    Appearance: He is not diaphoretic.  HENT:     Head: Normocephalic and atraumatic.  Eyes:     General: No scleral icterus.    Extraocular Movements: Extraocular movements intact.  Conjunctiva/sclera: Conjunctivae normal.     Pupils: Pupils are equal, round, and reactive to light.  Cardiovascular:     Rate and Rhythm: Regular rhythm. Tachycardia present.     Pulses: Normal pulses.     Heart sounds: Normal heart sounds.     Comments: 2+ bilateral radial/dorsalis pedis pulses with increased rate Pulmonary:     Effort: Pulmonary effort is normal. No respiratory distress.     Breath sounds: Normal breath sounds.  Abdominal:     Palpations: Abdomen is soft.     Tenderness: There is abdominal tenderness (Right upper quadrant). There is no guarding or rebound.   Musculoskeletal:        General: Normal range of motion.     Cervical back: Normal range of motion and neck supple.     Comments: 5 out of 5 bilateral grip/leg extension strength  Skin:    General: Skin is warm and dry.     Capillary Refill: Capillary refill takes less than 2 seconds.     Coloration: Skin is not jaundiced.  Neurological:     General: No focal deficit present.     Mental Status: He is alert and oriented to person, place, and time.     Comments: Sensation intact in all 4 limbs  Psychiatric:        Mood and Affect: Mood normal.     ED Results / Procedures / Treatments   Labs (all labs ordered are listed, but only abnormal results are displayed) Labs Reviewed  BLOOD GAS, VENOUS - Abnormal; Notable for the following components:      Result Value   pCO2, Ven 35 (*)    Bicarbonate 17.6 (*)    Acid-base deficit 7.8 (*)    All other components within normal limits  CBG MONITORING, ED - Abnormal; Notable for the following components:   Glucose-Capillary 479 (*)    All other components within normal limits  BETA-HYDROXYBUTYRIC ACID  BASIC METABOLIC PANEL  BASIC METABOLIC PANEL  BASIC METABOLIC PANEL  BASIC METABOLIC PANEL  CBC WITH DIFFERENTIAL/PLATELET  URINALYSIS, ROUTINE W REFLEX MICROSCOPIC  ETHANOL  RAPID URINE DRUG SCREEN, HOSP PERFORMED  CBC WITH DIFFERENTIAL/PLATELET    EKG None  Radiology No results found.  Procedures .Critical Care  Performed by: Netta Corrigan, PA-C Authorized by: Netta Corrigan, PA-C   Critical care provider statement:    Critical care time (minutes):  40   Critical care time was exclusive of:  Separately billable procedures and treating other patients   Critical care was necessary to treat or prevent imminent or life-threatening deterioration of the following conditions:  Metabolic crisis   Critical care was time spent personally by me on the following activities:  Blood draw for specimens, discussions with  consultants, development of treatment plan with patient or surrogate, evaluation of patient's response to treatment, examination of patient, obtaining history from patient or surrogate, review of old charts, re-evaluation of patient's condition, ordering and review of radiographic studies, pulse oximetry, ordering and review of laboratory studies and ordering and performing treatments and interventions   I assumed direction of critical care for this patient from another provider in my specialty: no     Care discussed with: admitting provider       Medications Ordered in ED Medications  morphine (PF) 4 MG/ML injection 4 mg (has no administration in time range)  lactated ringers bolus 1,252 mL (has no administration in time range)  insulin regular, human (MYXREDLIN) 100 units/ 100 mL  infusion (has no administration in time range)  lactated ringers infusion (has no administration in time range)  dextrose 5 % in lactated ringers infusion (has no administration in time range)  dextrose 50 % solution 0-50 mL (has no administration in time range)  potassium chloride 10 mEq in 100 mL IVPB (has no administration in time range)  iohexol (OMNIPAQUE) 300 MG/ML solution 100 mL (100 mLs Intravenous Contrast Given 08/25/23 2308)    ED Course/ Medical Decision Making/ A&P                                 Medical Decision Making Amount and/or Complexity of Data Reviewed Labs: ordered. Radiology: ordered.  Risk Prescription drug management. Decision regarding hospitalization.   [name] 37 y.o. presented today for hyperglycemia.  Working DDx that I considered at this time includes, but not limited to, hyperglycemia, DKA/HHS, electrolyte abnormalities, occult infection, euglycemic DKA, sepsis.  R/o DDx: Fully assessed as we are still waiting on some labs  Review of prior external notes: 08/25/2023 progress Notes  Unique Tests and My Interpretation:  CBG: 479 CBC: Unremarkable CMP: Glucose 469,  transaminitis 77/133, alk phos 788, anion gap 29 Lipase: Unremarkable Beta-hydroxybutyrate: Pending VBG: pH 7.31 UA: Glucosuria CT abdomen pelvis with contrast: Pending UDS: Pending Ethanol: Pending  Discussion with Independent Historian: None  Discussion of Management of Tests:  Mikeal Hawthorne, MD Hospitalist; Carolynne Edouard, MD Hospitalist  Risk: High: hospitalization or escalation of hospital-level care  Risk Stratification Score: none  Staffed with [place], MD  Plan: On exam patient was in no acute distress but was tachycardic on 119.  Patient's glucose upon arrival was 479.  Patient does have history of diabetes and uses insulin.  Patient's diabetic medications include glargine and NovoLog and last use of them was this morning.  Patient denies any missed doses or any recent illnesses or fevers that may have caused his sugar to jump or even any diet changes.  Patient did have right upper quadrant tenderness to exam and after reviewing the labs done earlier today at [facility] patient does have elevated alkaline phos along with liver enzymes and so we will get CT to further evaluate as patient does have extensive abdominal history.  Patient is not jaundice or icterus on exam. DKA treatment initiated.  I spoke to the attending and we agreed to admit the patient.  Hospitalist will be consulted and patient is stable to be admitted at this time.  Spoke to the hospitalist and he recommends adding on ethanol along with UDS but will go see the patient.  Patient stable for admission at this time.  Radiology called stating that patient has multiple small bowel intussusceptions that could be contributing to his abdominal pain.  Hospitalist is currently the attending on this patient so I messaged the hospitalist about this finding as patient most likely needs general surgery consult.  I spoke to general surgery and they state that they will see the patient in the morning as patient does not have white count  does not have distended abdomen.  This was relayed to the hospitalist.  This chart was dictated using voice recognition software.  Despite best efforts to proofread,  errors can occur which can change the documentation meaning.         Final Clinical Impression(s) / ED Diagnoses Final diagnoses:  Diabetic ketoacidosis without coma associated with type 1 diabetes mellitus (HCC)    Rx / DC  Orders ED Discharge Orders     None         Remi Deter 08/25/23 2326    Netta Corrigan, PA-C 08/25/23 Adin Hector    Glyn Ade, MD 08/25/23 [phone]

## 2023-08-26 DIAGNOSIS — K561 Intussusception: Secondary | ICD-10-CM

## 2023-08-26 DIAGNOSIS — K859 Acute pancreatitis without necrosis or infection, unspecified: Secondary | ICD-10-CM

## 2023-08-26 DIAGNOSIS — E1010 Type 1 diabetes mellitus with ketoacidosis without coma: Secondary | ICD-10-CM | POA: Diagnosis not present

## 2023-08-26 DIAGNOSIS — Z9889 Other specified postprocedural states: Secondary | ICD-10-CM

## 2023-08-26 DIAGNOSIS — R1084 Generalized abdominal pain: Secondary | ICD-10-CM

## 2023-08-26 DIAGNOSIS — K861 Other chronic pancreatitis: Secondary | ICD-10-CM

## 2023-08-26 LAB — GLUCOSE, CAPILLARY
Glucose-Capillary: 148 mg/dL — ABNORMAL HIGH (ref 70–99)
Glucose-Capillary: 209 mg/dL — ABNORMAL HIGH (ref 70–99)
Glucose-Capillary: 176 mg/dL — ABNORMAL HIGH (ref 70–99)
Glucose-Capillary: 231 mg/dL — ABNORMAL HIGH (ref 70–99)
Glucose-Capillary: 211 mg/dL — ABNORMAL HIGH (ref 70–99)
Glucose-Capillary: 137 mg/dL — ABNORMAL HIGH (ref 70–99)
Glucose-Capillary: 217 mg/dL — ABNORMAL HIGH (ref 70–99)
Glucose-Capillary: 222 mg/dL — ABNORMAL HIGH (ref 70–99)
Glucose-Capillary: 202 mg/dL — ABNORMAL HIGH (ref 70–99)
Glucose-Capillary: 162 mg/dL — ABNORMAL HIGH (ref 70–99)
Glucose-Capillary: 203 mg/dL — ABNORMAL HIGH (ref 70–99)
Glucose-Capillary: 183 mg/dL — ABNORMAL HIGH (ref 70–99)
Glucose-Capillary: 164 mg/dL — ABNORMAL HIGH (ref 70–99)

## 2023-08-26 LAB — BASIC METABOLIC PANEL
Anion gap: 25 — ABNORMAL HIGH (ref 5–15)
Anion gap: 12 (ref 5–15)
Anion gap: 14 (ref 5–15)
Anion gap: 10 (ref 5–15)
Anion gap: 10 (ref 5–15)
Anion gap: 7 (ref 5–15)
BUN: 12 mg/dL (ref 6–20)
BUN: 13 mg/dL (ref 6–20)
BUN: 14 mg/dL (ref 6–20)
BUN: 13 mg/dL (ref 6–20)
BUN: 14 mg/dL (ref 6–20)
BUN: 14 mg/dL (ref 6–20)
CO2: 14 mmol/L — ABNORMAL LOW (ref 22–32)
CO2: 24 mmol/L (ref 22–32)
CO2: 27 mmol/L (ref 22–32)
CO2: 27 mmol/L (ref 22–32)
CO2: 28 mmol/L (ref 22–32)
CO2: 28 mmol/L (ref 22–32)
Calcium: 9.5 mg/dL (ref 8.9–10.3)
Calcium: 9 mg/dL (ref 8.9–10.3)
Calcium: 9.5 mg/dL (ref 8.9–10.3)
Calcium: 9.2 mg/dL (ref 8.9–10.3)
Calcium: 8.9 mg/dL (ref 8.9–10.3)
Calcium: 8.8 mg/dL — ABNORMAL LOW (ref 8.9–10.3)
Chloride: 90 mmol/L — ABNORMAL LOW (ref 98–111)
Chloride: 96 mmol/L — ABNORMAL LOW (ref 98–111)
Chloride: 97 mmol/L — ABNORMAL LOW (ref 98–111)
Chloride: 98 mmol/L (ref 98–111)
Chloride: 97 mmol/L — ABNORMAL LOW (ref 98–111)
Chloride: 100 mmol/L (ref 98–111)
Creatinine, Ser: 1.11 mg/dL (ref 0.61–1.24)
Creatinine, Ser: 0.83 mg/dL (ref 0.61–1.24)
Creatinine, Ser: 0.8 mg/dL (ref 0.61–1.24)
Creatinine, Ser: 0.57 mg/dL — ABNORMAL LOW (ref 0.61–1.24)
Creatinine, Ser: 0.67 mg/dL (ref 0.61–1.24)
Creatinine, Ser: 0.76 mg/dL (ref 0.61–1.24)
GFR, Estimated: >60 mL/min (ref >60)
GFR, Estimated: >60 mL/min (ref >60)
GFR, Estimated: >60 mL/min (ref >60)
GFR, Estimated: >60 mL/min (ref >60)
GFR, Estimated: >60 mL/min (ref >60)
GFR, Estimated: >60 mL/min (ref >60)
Glucose, Bld: 556 mg/dL (ref 70–99)
Glucose, Bld: 135 mg/dL — ABNORMAL HIGH (ref 70–99)
Glucose, Bld: 197 mg/dL — ABNORMAL HIGH (ref 70–99)
Glucose, Bld: 196 mg/dL — ABNORMAL HIGH (ref 70–99)
Glucose, Bld: 207 mg/dL — ABNORMAL HIGH (ref 70–99)
Glucose, Bld: 181 mg/dL — ABNORMAL HIGH (ref 70–99)
Potassium: 5 mmol/L (ref 3.5–5.1)
Potassium: 4 mmol/L (ref 3.5–5.1)
Potassium: 3.8 mmol/L (ref 3.5–5.1)
Potassium: 3.5 mmol/L (ref 3.5–5.1)
Potassium: 3.5 mmol/L (ref 3.5–5.1)
Potassium: 3.9 mmol/L (ref 3.5–5.1)
Sodium: 129 mmol/L — ABNORMAL LOW (ref 135–145)
Sodium: 132 mmol/L — ABNORMAL LOW (ref 135–145)
Sodium: 138 mmol/L (ref 135–145)
Sodium: 135 mmol/L (ref 135–145)
Sodium: 135 mmol/L (ref 135–145)
Sodium: 135 mmol/L (ref 135–145)

## 2023-08-26 LAB — CBG MONITORING, ED
Glucose-Capillary: 123 mg/dL — ABNORMAL HIGH (ref 70–99)
Glucose-Capillary: 159 mg/dL — ABNORMAL HIGH (ref 70–99)
Glucose-Capillary: 171 mg/dL — ABNORMAL HIGH (ref 70–99)
Glucose-Capillary: 227 mg/dL — ABNORMAL HIGH (ref 70–99)
Glucose-Capillary: 380 mg/dL — ABNORMAL HIGH (ref 70–99)
Glucose-Capillary: 528 mg/dL (ref 70–99)
Glucose-Capillary: 536 mg/dL (ref 70–99)

## 2023-08-26 LAB — RAPID URINE DRUG SCREEN, HOSP PERFORMED
Amphetamines: NOT DETECTED
Barbiturates: NOT DETECTED
Benzodiazepines: NOT DETECTED
Cocaine: NOT DETECTED
Opiates: NOT DETECTED
Tetrahydrocannabinol: NOT DETECTED

## 2023-08-26 LAB — MRSA NEXT GEN BY PCR, NASAL: MRSA by PCR Next Gen: NOT DETECTED

## 2023-08-26 LAB — BETA-HYDROXYBUTYRIC ACID
Beta-Hydroxybutyric Acid: 2.47 mmol/L — ABNORMAL HIGH (ref 0.05–0.27)
Beta-Hydroxybutyric Acid: 0.66 mmol/L — ABNORMAL HIGH (ref 0.05–0.27)

## 2023-08-26 LAB — ETHANOL: Alcohol, Ethyl (B): <10 mg/dL (ref <10)

## 2023-08-26 MED ORDER — ONDANSETRON HCL 4 MG/2ML IJ SOLN: 4.0000 mg | Freq: Three times a day (TID) | INTRAMUSCULAR | Status: DC | PRN

## 2023-08-26 MED ORDER — INSULIN ASPART 100 UNIT/ML IJ SOLN
0.0000 [IU] | INTRAMUSCULAR | Status: DC
  Administered 2023-08-26 – 2023-08-27 (×3): 2 [IU] via SUBCUTANEOUS
  Administered 2023-08-27: 1 [IU] via SUBCUTANEOUS

## 2023-08-26 MED ORDER — HYDROMORPHONE HCL 1 MG/ML IJ SOLN: 0.5000 mg | INTRAMUSCULAR | Status: DC | PRN

## 2023-08-26 MED ORDER — DEXTROSE IN LACTATED RINGERS 5 % IV SOLN: INTRAVENOUS | Status: DC

## 2023-08-26 MED ORDER — DEXTROSE 50 % IV SOLN: 0.0000 mL | INTRAVENOUS | Status: DC | PRN

## 2023-08-26 MED ORDER — CHLORHEXIDINE GLUCONATE CLOTH 2 % EX PADS
6.0000 | MEDICATED_PAD | Freq: Every day | CUTANEOUS | Status: DC
  Administered 2023-08-26 – 2023-08-28 (×2): 6 via TOPICAL

## 2023-08-26 MED ORDER — LACTATED RINGERS IV SOLN: INTRAVENOUS | Status: DC

## 2023-08-26 MED ORDER — ONDANSETRON HCL 4 MG/2ML IJ SOLN: 4.0000 mg | Freq: Four times a day (QID) | INTRAMUSCULAR | Status: DC | PRN

## 2023-08-26 MED ORDER — HYDROMORPHONE HCL 1 MG/ML IJ SOLN
1.0000 mg | Freq: Two times a day (BID) | INTRAMUSCULAR | Status: DC | PRN
  Administered 2023-08-26: 1 mg via INTRAVENOUS
  Filled 2023-08-26: qty 1

## 2023-08-26 MED ORDER — INSULIN REGULAR(HUMAN) IN NACL 100-0.9 UT/100ML-% IV SOLN: INTRAVENOUS | Status: DC

## 2023-08-26 MED ORDER — ENOXAPARIN SODIUM 40 MG/0.4ML IJ SOSY: 40.0000 mg | PREFILLED_SYRINGE | INTRAMUSCULAR | Status: DC

## 2023-08-26 MED ORDER — HYDROMORPHONE HCL 1 MG/ML IJ SOLN
1.0000 mg | INTRAMUSCULAR | Status: DC | PRN
  Administered 2023-08-26 – 2023-08-28 (×16): 1 mg via INTRAVENOUS
  Filled 2023-08-26 (×15): qty 1

## 2023-08-26 MED ORDER — POTASSIUM CHLORIDE 10 MEQ/100ML IV SOLN
10.0000 meq | INTRAVENOUS | Status: AC
  Administered 2023-08-26 (×2): 10 meq via INTRAVENOUS
  Filled 2023-08-26 (×2): qty 100

## 2023-08-26 MED ORDER — LACTATED RINGERS IV BOLUS: 20.0000 mL/kg | Freq: Once | INTRAVENOUS | Status: DC

## 2023-08-26 MED ORDER — INSULIN GLARGINE-YFGN 100 UNIT/ML ~~LOC~~ SOLN
10.0000 [IU] | SUBCUTANEOUS | Status: DC
  Administered 2023-08-26: 10 [IU] via SUBCUTANEOUS
  Filled 2023-08-26: qty 0.1

## 2023-08-26 NOTE — Consult Note (Signed)
Reason for Consult: Abdominal pain, nausea vomiting Referring Physician: Dr. Bridgett Larsson Cartee is an 37 y.o. male.  HPI: Patient is a 37 year old male, with a history of chronic pancreatitis on Dilaudid, who comes in secondary to abdominal pain.  Patient states he has had pain since 10/9.  He states he had nausea vomiting, no diarrhea.  Patient states that the pain has been fairly generalized.  Patient states he usually has constipation.  He states he has had no blood in his stool that he has noticed no dark current jelly blood.  Patient on ED admission underwent CT scan.  This was significant for a few areas of small bowel intussusception.  General surgery was consulted for further evaluation and management.  Past Medical History:  Diagnosis Date   AKI (acute kidney injury) (HCC) 12/21/2021   Anxiety    Diabetes (HCC)    Pancreatitis    Splenic vein thrombosis     Past Surgical History:  Procedure Laterality Date   BILIARY BRUSHING  02/02/2023   Procedure: BILIARY BRUSHING;  Surgeon: [name] [name], MD;  Location: [facility]OSCOPY;  Service: Gastroenterology;;   BILIARY BRUSHING  04/28/2023   Procedure: BILIARY BRUSHING;  Surgeon: Meryl Dare, MD;  Location: [facility] ENDOSCOPY;  Service: Gastroenterology;;   BILIARY BRUSHING  06/02/2023   Procedure: BILIARY BRUSHING;  Surgeon: Lemar Lofty., MD;  Location: [facility]OSCOPY;  Service: Gastroenterology;;   BILIARY DILATION  12/01/2022   Procedure: BILIARY DILATION;  Surgeon: Lemar Lofty., MD;  Location: [facility]OSCOPY;  Service: Gastroenterology;;   BILIARY DILATION  02/02/2023   Procedure: BILIARY DILATION;  Surgeon: Lemar Lofty., MD;  Location: [facility];  Service: Gastroenterology;;   BILIARY STENT PLACEMENT  04/28/2023   Procedure: BILIARY STENT PLACEMENT;  Surgeon: Meryl Dare, MD;  Location: [facility] ENDOSCOPY;  Service: Gastroenterology;;   BILIARY STENT PLACEMENT N/A 06/02/2023   Procedure:  BILIARY STENT PLACEMENT;  Surgeon: Lemar Lofty., MD;  Location: [facility] ENDOSCOPY;  Service: Gastroenterology;  Laterality: N/A;   BIOPSY  12/02/2021   Procedure: BIOPSY;  Surgeon: [name] [name], MD;  Location: [facility] ENDOSCOPY;  Service: Gastroenterology;;   ENDOSCOPIC RETROGRADE CHOLANGIOPANCREATOGRAPHY (ERCP) WITH PROPOFOL N/A 08/12/2022   Procedure: ENDOSCOPIC RETROGRADE CHOLANGIOPANCREATOGRAPHY (ERCP) WITH PROPOFOL;  Surgeon: Lemar Lofty., MD;  Location: [facility] ENDOSCOPY;  Service: Gastroenterology;  Laterality: N/A;   ENDOSCOPIC RETROGRADE CHOLANGIOPANCREATOGRAPHY (ERCP) WITH PROPOFOL N/A 12/01/2022   Procedure: ENDOSCOPIC RETROGRADE CHOLANGIOPANCREATOGRAPHY (ERCP) WITH PROPOFOL;  Surgeon: [name] [name], MD;  Location: [facility] ENDOSCOPY;  Service: Gastroenterology;  Laterality: N/A;   ENDOSCOPIC RETROGRADE CHOLANGIOPANCREATOGRAPHY (ERCP) WITH PROPOFOL N/A 02/02/2023   Procedure: ENDOSCOPIC RETROGRADE CHOLANGIOPANCREATOGRAPHY (ERCP) WITH PROPOFOL;  Surgeon: [name] [name], MD;  Location: [facility] ENDOSCOPY;  Service: Gastroenterology;  Laterality: N/A;   ENDOSCOPIC RETROGRADE CHOLANGIOPANCREATOGRAPHY (ERCP) WITH PROPOFOL N/A 06/02/2023   Procedure: ENDOSCOPIC RETROGRADE CHOLANGIOPANCREATOGRAPHY (ERCP) WITH PROPOFOL;  Surgeon: [name] [name], MD;  Location: [facility] ENDOSCOPY;  Service: Gastroenterology;  Laterality: N/A;   ERCP N/A 04/28/2023   Procedure: ENDOSCOPIC RETROGRADE CHOLANGIOPANCREATOGRAPHY (ERCP);  Surgeon: Meryl Dare, MD;  Location: [facility] ENDOSCOPY;  Service: Gastroenterology;  Laterality: N/A;   ESOPHAGOGASTRODUODENOSCOPY N/A 12/01/2022   Procedure: ESOPHAGOGASTRODUODENOSCOPY (EGD);  Surgeon: Lemar Lofty., MD;  Location: [facility];  Service: Gastroenterology;  Laterality: N/A;   ESOPHAGOGASTRODUODENOSCOPY (EGD) WITH PROPOFOL N/A 12/02/2021   Procedure: ESOPHAGOGASTRODUODENOSCOPY (EGD) WITH PROPOFOL;  Surgeon: [name] [name],  MD;  Location: [facility] ENDOSCOPY;  Service: Gastroenterology;  Laterality: N/A;   ESOPHAGOGASTRODUODENOSCOPY (EGD) WITH PROPOFOL  N/A 07/09/2023   Procedure: ESOPHAGOGASTRODUODENOSCOPY (EGD) WITH PROPOFOL;  Surgeon: Hilarie Fredrickson, MD;  Location: [facility] ENDOSCOPY;  Service: Gastroenterology;  Laterality: N/A;  needs ercp scope for stent pull   FINE NEEDLE ASPIRATION N/A 12/01/2022   Procedure: FINE NEEDLE ASPIRATION (FNA) LINEAR;  Surgeon: Lemar Lofty., MD;  Location: [facility] ENDOSCOPY;  Service: Gastroenterology;  Laterality: N/A;   NEUROLYTIC CELIAC PLEXUS  12/01/2022   Procedure: CELIAC PLEXUS BLOCK;  Surgeon: [name] [name], MD;  Location: [facility];  Service: Gastroenterology;;   PANCREATIC STENT PLACEMENT  08/12/2022   Procedure: PANCREATIC STENT PLACEMENT;  Surgeon: Lemar Lofty., MD;  Location: [facility];  Service: Gastroenterology;;   PANCREATIC STENT PLACEMENT  12/01/2022   Procedure: PANCREATIC STENT PLACEMENT;  Surgeon: Lemar Lofty., MD;  Location: [facility];  Service: Gastroenterology;;   PANCREATIC STENT PLACEMENT  02/02/2023   Procedure: PANCREATIC STENT PLACEMENT;  Surgeon: Lemar Lofty., MD;  Location: [facility];  Service: Gastroenterology;;   PANCREATIC STENT PLACEMENT  06/02/2023   Procedure: PANCREATIC STENT PLACEMENT;  Surgeon: Lemar Lofty., MD;  Location: [facility];  Service: Gastroenterology;;   REMOVAL OF STONES  12/01/2022   Procedure: REMOVAL OF STONES;  Surgeon: Lemar Lofty., MD;  Location: [facility];  Service: Gastroenterology;;   REMOVAL OF STONES  02/02/2023   Procedure: REMOVAL OF STONES;  Surgeon: Lemar Lofty., MD;  Location: [facility];  Service: Gastroenterology;;   REMOVAL OF STONES  06/02/2023   Procedure: REMOVAL OF STONES;  Surgeon: Lemar Lofty., MD;  Location: [facility];  Service: Gastroenterology;;   [name]  08/12/2022   Procedure: [name];   Surgeon: Lemar Lofty., MD;  Location: [facility];  Service: Gastroenterology;;   [name]  12/01/2022   Procedure: [name];  Surgeon: Lemar Lofty., MD;  Location: [facility];  Service: Gastroenterology;;   [name]  06/02/2023   Procedure: [name];  Surgeon: Lemar Lofty., MD;  Location: [facility];  Service: Gastroenterology;;   Francine Graven REMOVAL  12/01/2022   Procedure: STENT REMOVAL;  Surgeon: Lemar Lofty., MD;  Location: [facility];  Service: Gastroenterology;;   Francine Graven REMOVAL  02/02/2023   Procedure: STENT REMOVAL;  Surgeon: Lemar Lofty., MD;  Location: [facility];  Service: Gastroenterology;;   Francine Graven REMOVAL  06/02/2023   Procedure: STENT REMOVAL;  Surgeon: Lemar Lofty., MD;  Location: [facility];  Service: Gastroenterology;;   Francine Graven REMOVAL  07/09/2023   Procedure: STENT REMOVAL;  Surgeon: Hilarie Fredrickson, MD;  Location: [facility] ENDOSCOPY;  Service: Gastroenterology;;   UPPER ESOPHAGEAL ENDOSCOPIC ULTRASOUND (EUS) N/A 12/02/2021   Procedure: UPPER ESOPHAGEAL ENDOSCOPIC ULTRASOUND (EUS);  Surgeon: Lemar Lofty., MD;  Location: [facility];  Service: Gastroenterology;  Laterality: N/A;   UPPER ESOPHAGEAL ENDOSCOPIC ULTRASOUND (EUS) N/A 12/01/2022   Procedure: UPPER ESOPHAGEAL ENDOSCOPIC ULTRASOUND (EUS);  Surgeon: Lemar Lofty., MD;  Location: [facility];  Service: Gastroenterology;  Laterality: N/A;    Family History  Problem Relation Age of Onset   Hypertension Mother    Alcoholism Maternal Uncle    Diabetes Maternal Grandmother    Diabetes Maternal Grandfather    Diabetes Paternal Grandmother    Diabetes Paternal Grandfather    Colon cancer Neg Hx    Esophageal cancer Neg Hx    Inflammatory bowel disease Neg Hx    Liver disease Neg Hx    Pancreatic cancer Neg Hx    Rectal cancer Neg Hx    Stomach cancer Neg Hx     Social  History:  reports that he has been  smoking cigars. He started smoking about 20 years ago. He has never used smokeless tobacco. He reports that he does not currently use alcohol. He reports that he does not use drugs.  Allergies:  Allergies  Allergen Reactions   Other Anaphylaxis and Other (See Comments)    [name] - anaphylaxis    Medications: I have reviewed the patient's current medications.  Results for orders placed or performed during the hospital encounter of 08/25/23 (from the past 48 hour(s))  CBG monitoring, ED     Status: Abnormal   Collection Time: 08/25/23  9:54 PM  Result Value Ref Range   Glucose-Capillary 479 (H) 70 - 99 mg/dL    Comment: Glucose reference range applies only to samples taken after fasting for at least 8 hours.  Blood gas, venous     Status: Abnormal   Collection Time: 08/25/23 10:48 PM  Result Value Ref Range   pH, Ven 7.31 7.25 - 7.43   pCO2, Ven 35 (L) 44 - 60 mmHg   pO2, Ven 43 32 - 45 mmHg   Bicarbonate 17.6 (L) 20.0 - 28.0 mmol/L   Acid-base deficit 7.8 (H) 0.0 - 2.0 mmol/L   O2 Saturation 70.7 %   Patient temperature 37.0     Comment: Performed at [facility], [address] [place], [place] [address]  Beta-hydroxybutyric acid     Status: Abnormal   Collection Time: 08/25/23 10:48 PM  Result Value Ref Range   Beta-Hydroxybutyric Acid >8.00 (H) 0.05 - 0.27 mmol/L    Comment: RESULT CONFIRMED BY MANUAL DILUTION Performed at [facility], [address] [place], [place] [address]   Basic metabolic panel     Status: Abnormal   Collection Time: 08/25/23 11:22 PM  Result Value Ref Range   Sodium 129 (L) 135 - 145 mmol/L   Potassium 5.0 3.5 - 5.1 mmol/L   Chloride 90 (L) 98 - 111 mmol/L   CO2 14 (L) 22 - 32 mmol/L   Glucose, Bld 556 (HH) 70 - 99 mg/dL    Comment: CRITICAL RESULT CALLED TO, READ BACK BY AND VERIFIED WITH RIVERS,TJ RN @ 2359 08/25/23 BY CHILDRESS,E Glucose reference range applies only to samples taken after fasting  for at least 8 hours.    BUN 12 6 - 20 mg/dL   Creatinine, Ser 5.28 0.61 - 1.24 mg/dL   Calcium 9.5 8.9 - 41.3 mg/dL   GFR, Estimated >24 >40 mL/min    Comment: (NOTE) Calculated using the CKD-EPI Creatinine Equation (2021)    Anion gap 25 (H) 5 - 15    Comment: ELECTROLYTES REPEATED TO VERIFY Performed at [facility], [address], [place], [place] [address]   Urinalysis, Routine w reflex microscopic -Urine, Clean Catch     Status: Abnormal   Collection Time: 08/25/23 11:31 PM  Result Value Ref Range   Color, Urine STRAW (A) YELLOW   APPearance CLEAR CLEAR   Specific Gravity, Urine 1.036 (H) 1.005 - 1.030   pH 5.0 5.0 - 8.0   Glucose, UA >=500 (A) NEGATIVE mg/dL   Hgb urine dipstick NEGATIVE NEGATIVE   Bilirubin Urine NEGATIVE NEGATIVE   Ketones, ur 20 (A) NEGATIVE mg/dL   Protein, ur 30 (A) NEGATIVE mg/dL   Nitrite NEGATIVE NEGATIVE   Leukocytes,Ua NEGATIVE NEGATIVE   RBC / HPF 0-5 0 - 5 RBC/hpf   WBC, UA 0-5 0 - 5 WBC/hpf   Bacteria, UA NONE  SEEN NONE SEEN   Squamous Epithelial / HPF 0-5 0 - 5 /HPF    Comment: Performed at [facility], [address], [place], [place] [address]  Rapid urine drug screen (hospital performed)     Status: None   Collection Time: 08/25/23 11:31 PM  Result Value Ref Range   Opiates NONE DETECTED NONE DETECTED   Cocaine NONE DETECTED NONE DETECTED   Benzodiazepines NONE DETECTED NONE DETECTED   Amphetamines NONE DETECTED NONE DETECTED   Tetrahydrocannabinol NONE DETECTED NONE DETECTED   Barbiturates NONE DETECTED NONE DETECTED    Comment: (NOTE) DRUG SCREEN FOR MEDICAL PURPOSES ONLY.  IF CONFIRMATION IS NEEDED FOR ANY PURPOSE, NOTIFY LAB WITHIN 5 DAYS.  LOWEST DETECTABLE LIMITS FOR URINE DRUG SCREEN Drug Class                     Cutoff (ng/mL) Amphetamine and metabolites    1000 Barbiturate and metabolites    200 Benzodiazepine                 200 Opiates and metabolites        300 Cocaine  and metabolites        300 THC                            50 Performed at [facility], [address], [place], [place] [address]   CBG monitoring, ED     Status: Abnormal   Collection Time: 08/25/23 11:44 PM  Result Value Ref Range   Glucose-Capillary 536 (HH) 70 - 99 mg/dL    Comment: Glucose reference range applies only to samples taken after fasting for at least 8 hours.  Ethanol     Status: None   Collection Time: 08/25/23 11:46 PM  Result Value Ref Range   Alcohol, Ethyl (B) <10 <10 mg/dL    Comment: (NOTE) Lowest detectable limit for serum alcohol is 10 mg/dL.  For medical purposes only. Performed at [facility], [address], [place], [place] [address]   CBC with Differential/Platelet     Status: Abnormal   Collection Time: 08/25/23 11:46 PM  Result Value Ref Range   WBC 5.3 4.0 - 10.5 K/uL   RBC 4.15 (L) 4.22 - 5.81 MIL/uL   Hemoglobin 15.0 13.0 - 17.0 g/dL   HCT 47.4 25.9 - 56.3 %   MCV 108.4 (H) 80.0 - 100.0 fL   MCH 36.1 (H) 26.0 - 34.0 pg   MCHC 33.3 30.0 - 36.0 g/dL   RDW 87.5 64.3 - 32.9 %   Platelets 205 150 - 400 K/uL   nRBC 0.0 0.0 - 0.2 %   Neutrophils Relative % 66 %   Neutro Abs 3.5 1.7 - 7.7 K/uL   Lymphocytes Relative 19 %   Lymphs Abs 1.0 0.7 - 4.0 K/uL   Monocytes Relative 14 %   Monocytes Absolute 0.7 0.1 - 1.0 K/uL   Eosinophils Relative 1 %   Eosinophils Absolute 0.1 0.0 - 0.5 K/uL   Basophils Relative 0 %   Basophils Absolute 0.0 0.0 - 0.1 K/uL   Immature Granulocytes 0 %   Abs Immature Granulocytes 0.02 0.00 - 0.07 K/uL    Comment: Performed at [facility], [address] [place], [place] [address]  CBG monitoring, ED     Status: Abnormal   Collection Time: 08/26/23 12:27 AM  Result Value Ref Range  Glucose-Capillary 528 (HH) 70 - 99 mg/dL    Comment: Glucose reference range applies only to samples taken after fasting for at least 8 hours.   Comment 1 Notify RN   CBG  monitoring, ED     Status: Abnormal   Collection Time: 08/26/23 12:53 AM  Result Value Ref Range   Glucose-Capillary 380 (H) 70 - 99 mg/dL    Comment: Glucose reference range applies only to samples taken after fasting for at least 8 hours.  CBG monitoring, ED     Status: Abnormal   Collection Time: 08/26/23  2:12 AM  Result Value Ref Range   Glucose-Capillary 227 (H) 70 - 99 mg/dL    Comment: Glucose reference range applies only to samples taken after fasting for at least 8 hours.  CBG monitoring, ED     Status: Abnormal   Collection Time: 08/26/23  3:03 AM  Result Value Ref Range   Glucose-Capillary 171 (H) 70 - 99 mg/dL    Comment: Glucose reference range applies only to samples taken after fasting for at least 8 hours.  CBG monitoring, ED     Status: Abnormal   Collection Time: 08/26/23  4:17 AM  Result Value Ref Range   Glucose-Capillary 159 (H) 70 - 99 mg/dL    Comment: Glucose reference range applies only to samples taken after fasting for at least 8 hours.  Basic metabolic panel     Status: Abnormal   Collection Time: 08/26/23  4:29 AM  Result Value Ref Range   Sodium 132 (L) 135 - 145 mmol/L   Potassium 4.0 3.5 - 5.1 mmol/L   Chloride 96 (L) 98 - 111 mmol/L   CO2 24 22 - 32 mmol/L   Glucose, Bld 135 (H) 70 - 99 mg/dL    Comment: Glucose reference range applies only to samples taken after fasting for at least 8 hours.   BUN 13 6 - 20 mg/dL   Creatinine, Ser 5.78 0.61 - 1.24 mg/dL   Calcium 9.0 8.9 - 46.9 mg/dL   GFR, Estimated >62 >95 mL/min    Comment: (NOTE) Calculated using the CKD-EPI Creatinine Equation (2021)    Anion gap 12 5 - 15    Comment: Performed at [facility], [address], [place], [place] [address]  CBG monitoring, ED     Status: Abnormal   Collection Time: 08/26/23  5:44 AM  Result Value Ref Range   Glucose-Capillary 123 (H) 70 - 99 mg/dL    Comment: Glucose reference range applies only to samples taken after fasting for at  least 8 hours.  Glucose, capillary     Status: Abnormal   Collection Time: 08/26/23  8:15 AM  Result Value Ref Range   Glucose-Capillary 148 (H) 70 - 99 mg/dL    Comment: Glucose reference range applies only to samples taken after fasting for at least 8 hours.    CT ABDOMEN PELVIS W CONTRAST  Result Date: 08/25/2023 CLINICAL DATA:  Right upper quadrant pain. History of portal vein thrombosis. Nausea, vomiting. EXAM: CT ABDOMEN AND PELVIS WITH CONTRAST TECHNIQUE: Multidetector CT imaging of the abdomen and pelvis was performed using the standard protocol following bolus administration of intravenous contrast. RADIATION DOSE REDUCTION: This exam was performed according to the departmental dose-optimization program which includes automated exposure control, adjustment of the mA and/or kV according to patient size and/or use of iterative reconstruction technique. CONTRAST:  OMNIPAQUE IOHEXOL 300 MG/ML  SOLN COMPARISON:  08/07/2023 FINDINGS: Lower chest: No acute  abnormality Hepatobiliary: No focal liver abnormality. Diffuse low-density throughout the liver compatible with fatty infiltration. Small stones noted within the gallbladder fundus. No biliary ductal dilatation. Biliary stent noted in the common bile duct extending into the small bowel. Chronic portal vein thrombosis with cavernous transformation again noted, stable. Pancreas: Calcifications in the pancreas compatible with chronic pancreatitis. No surrounding inflammation to suggest acute pancreatitis. Spleen: No focal abnormality.  Normal size. Adrenals/Urinary Tract: No adrenal abnormality. No focal renal abnormality. No stones or hydronephrosis. Urinary bladder is unremarkable. Stomach/Bowel: Multiple small bowel intussusceptions noted, mostly in the right abdomen with 1 noted in the left abdomen as well. There is no evidence of bowel obstruction. Small bowel wall in the intussusception areas appears mildly edematous. Stomach and large bowel  unremarkable. Vascular/Lymphatic: No evidence of aneurysm or adenopathy. Reproductive: No visible focal abnormality. Other: No free fluid or free air. Musculoskeletal: No acute bony abnormality. IMPRESSION: Multiple small bowel intussusceptions noted, mostly in the right abdomen. These are of un etiology. No evidence of bowel obstruction. Small bowel wall appears mildly edematous which could be the cause of the intussusceptions or resulting from the intussusceptions. Cholelithiasis. Chronic portal vein thrombosis with cavernous transformation. Hepatic steatosis. These results were called by telephone at the time of interpretation on 08/25/2023 at 11:29 pm to provider [facility] , who verbally acknowledged these results. Electronically Signed   By: Charlett Nose M.D.   On: 08/25/2023 23:32    Review of Systems  Constitutional:  Negative for chills and fever.  HENT:  Negative for ear discharge, hearing loss and sore throat.   Eyes:  Negative for discharge.  Respiratory:  Negative for cough and shortness of breath.   Cardiovascular:  Negative for chest pain and leg swelling.  Gastrointestinal:  Positive for abdominal pain, nausea and vomiting. Negative for constipation and diarrhea.  Musculoskeletal:  Negative for myalgias and neck pain.  Skin:  Negative for rash.  Allergic/Immunologic: Negative for environmental allergies.  Neurological:  Negative for dizziness and seizures.  Hematological:  Does not bruise/bleed easily.  Psychiatric/Behavioral:  Negative for suicidal ideas.   All other systems reviewed and are negative.  Blood pressure (!) 122/93, pulse 86, temperature 97.8 F (36.6 C), temperature source Oral, resp. rate 18, weight 62.6 kg, SpO2 100%. Physical Exam Constitutional:      Appearance: He is well-developed.     Comments: Conversant No acute distress  HENT:     Head: Normocephalic and atraumatic.  Eyes:     General: Lids are normal. No scleral icterus.    Pupils: Pupils are  equal, round, and reactive to light.     Comments: Pupils are equal round and reactive No lid lag Moist conjunctiva  Neck:     Thyroid: No thyromegaly.     Trachea: No tracheal tenderness.     Comments: No cervical lymphadenopathy Cardiovascular:     Rate and Rhythm: Normal rate and regular rhythm.     Heart sounds: No murmur heard. Pulmonary:     Effort: Pulmonary effort is normal.     Breath sounds: Normal breath sounds. No wheezing or rales.  Abdominal:     General: There is distension.     Tenderness: There is abdominal tenderness. There is no guarding or rebound.     Hernia: No hernia is present.  Musculoskeletal:     Cervical back: Normal range of motion and neck supple.  Skin:    General: Skin is warm.     Findings: No rash.  Nails: There is no clubbing.     Comments: Normal skin turgor  Neurological:     Mental Status: He is alert and oriented to person, place, and time.     Comments: Normal gait and station  Psychiatric:        Mood and Affect: Mood normal.        Thought Content: Thought content normal.        Judgment: Judgment normal.     Comments: Appropriate affect     Assessment/Plan: 37 year old male with possible gastroenteritis, small bowel intussusception on CT scan without obstruction, chronic pancreatitis  1.  At this time intussusception is likely secondary to possible gastroenteritis.  Would not recommend any surgery at this time.  There is no signs of obstruction or concern of ischemic bowel.  2.  Would recommend antinausea regimen with p.o. trial in the next 1 to 2 days.  3.  Will follow along.  Axel Filler 08/26/2023, 9:00 AM

## 2023-08-26 NOTE — Consult Note (Addendum)
Consultation  Primary Care Physician:  Caesar Bookman, NP Primary Gastroenterologist:  Dr. [name]       Reason for Consultation: acute on chronic pancreatitis DOA: 08/25/2023         Hospital Day: 2         HPI:   Carlos [name] is a 37 y.o. 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 Presents to the ER with with worsening abdominal pain, nausea, and vomiting.CT abdomen pelvis showed multiple areas of intussusception of the abdomen GI consulted.  Work up in ED notable for: evidence of DKA with anion gap of 29, blood sugar of 469, beta-hydroxybutyrate acid >8, his ABG was borderline is 7.31 with suspected mixed acidosis. Alk phosp 788, previously 373 on 08/07/23. AST 77. Lipase 30. Hgb stable at 15. PLT 248. Additionally CT abdomen pelvis showed multiple areas of intussusception of the abdomen. Surgery consulted with recommendations for GI consult in the morning.   Imaging (see full report below):   08/25/2023 CT Abd/pelvis - shows multiple small bowel intussusceptions, mostly in right abd. No evidence of bowel obstruction. Cholelithiasis. Hepatic steatosis.  Extensive Procedure History:  EGD 07/09/2023 with Dr. Marina Goodell with removal of foreign body ( pancreatic duct stent). 06/02/2023 ERCP for PD stent evaluation and CBD stent evaluation in setting of chronic pancreatitis.  04/28/2023 ERCP with Dr. [name] showed a distal CBD stricture c/w findings on MRCP and two indwelling PD stents in good position. CBD stricture brushed for cytology  02/02/2023 ERCP with Dr. [name] removal of an occluded PD stent, removal of PD stones and placement of two plastic stents across PD stricture. Next day went to ER with  post ERCP pancreatitis. 12/01/22 EUS/ERCP with Dr. [name] for Celiac Block and evaluation of TOP lesion (likely pseudocyst but to rule out mass) and for ERCP for PD stent exchange in setting of PD stricture.  08/12/2022 ERCP with Dr. [name] with stent placement in the ventral duct of the pancreas for a stricture thought to be due to recurrent pancreatitis of unclear etiology. Unfortunately he developed post ERCP pancreatitis a few days later. EGD/EUS 12/02/21 per Dr. [name]. Remarkable findings included grade D erosive esophagitis and candidiasis. EUS showed chronic pancreatitis with parenchymal loss concerning for significant necrosis, no intervention performed.    Pt lying in bed, reports since 10/9 he has had generalized abdominal pain with nausea. Denies fever, chills, or diarrhea. Hx of constipation, last BM few days ago. No blood per rectum. No hx of abdominal surgeries. No recent reported viral infections or exposure. Nauseated this morning, will add prn antiemetics. Has had prn Dilaudid for the pain. Discussed the plan to continue to monitor the next 1-2 days, verbalizes understanding. During his last hospitalization I provided information on troubleshooting his Dexcom malfunction, unfortunately he has not fixed this issue or did the things I recommended. Reinforced importance of controlling blood sugars.   Previous GI workup:   08/25/2023 CT Abd/pelvis IMPRESSION: Multiple small bowel intussusceptions noted, mostly in the right abdomen. These are of un etiology. No evidence of bowel obstruction. Small bowel wall appears mildly edematous which could be the cause of the intussusceptions or resulting from the intussusceptions. Cholelithiasis. Chronic portal vein thrombosis with cavernous transformation. Hepatic steatosis.  08/07/2023 CT ABDOMEN AND  PELVIS WITH CONTRAST   IMPRESSION: 1. Acute on chronic pancreatitis. No pseudocyst identified. 2. Common bile duct stent in place.  No intrahepatic bile duct dilatation. 3. Moderate gallbladder distension. Layering stones versus sludge noted within the gallbladder fundus. No pericholecystic fluid or surrounding inflammatory change. 4. Mild irregularity of the extrahepatic portal vein caliber with scattered focal areas of mild narrowing up to the confluence without signs of thrombosis. 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  EGD 07/09/2023 with Dr. Marina Goodell with removal of foreign body ( pancreatic duct stent)  Impression:  1. Selective removal of pancreatic duct stent  2. Biliary stent remains in place  3. Otherwise unremarkable exam with side- viewing endoscope.   07/07/2023 CT ABDOMEN AND PELVIS WITH CONTRAST  IMPRESSION: 1. Pancreatitis with a 9 mm hypodense focus in the pancreatic tail which Marguetta Windish represent a developing pseudocyst or focal dilatation of the distal pancreatic duct. 2. Interval placement of a common bile duct stent and replacement of the pancreatic duct stent. 3. Fatty liver. 4. Cholelithiasis. 5. No bowel obstruction. Normal appendix.  04/26/2023 MRCP  IMPRESSION: Diffuse edema of the pancreas with atrophy. Adjacent stranding as well. Please correlate for any clinical evidence of acute on chronic pancreatitis. The calcification seen by prior CT are less appreciated today.   History of an indwelling pancreatic duct stent. There is severe dilatation of the duct towards the tail with a central stricture with a stent in place.   Worsening biliary ductal dilatation with abrupt caliber change of the distal common duct as the duct enters the pancreatic head. In this location there is an adjacent complex cystic lesion, possibly evolving pseudocyst. True severe common duct stricture is possible either from mass effect or true intrinsic narrowing. There is some enhancing wall to the duct in this location. Please correlate with findings at ERCP.   Distended/dilated gallbladder with  sludge and stones.   Several collateral vessels throughout the upper abdomen including the porta hepatis, central mesentery in the left upper quadrant with the areas of narrowing of the main portal vein near the portal venous confluence. Please correlate with previous venous occlusion.   Several prominent to mildly enlarged upper abdominal lymph nodes. Attention on follow-up.  04/25/2023 NUCLEAR MEDICINE HEPATOBILIARY IMAGING  IMPRESSION: 1. No signs of biliary or small bowel activity after 120 minutes of imaging with persistent uptake throughout the liver. Imaging findings are compatible with either common bile duct obstruction or hepatocyte dysfunction.  Abnormal ED labs: Abnormal Labs Reviewed  BLOOD GAS, VENOUS - Abnormal; Notable for the following components:      Result Value   pCO2, Ven 35 (*)    Bicarbonate 17.6 (*)    Acid-base deficit 7.8 (*)    All other components within normal limits  BETA-HYDROXYBUTYRIC ACID - Abnormal; Notable for the following components:   Beta-Hydroxybutyric Acid >8.00 (*)    All other components within normal limits  BASIC METABOLIC PANEL - Abnormal; Notable for the following components:   Sodium 129 (*)    Chloride 90 (*)    CO2 14 (*)    Glucose, Bld 556 (*)    Anion gap 25 (*)    All other components within normal limits  BASIC METABOLIC PANEL - Abnormal; Notable for the following components:   Sodium 132 (*)    Chloride 96 (*)    Glucose, Bld 135 (*)    All other components within normal limits  URINALYSIS, ROUTINE W REFLEX MICROSCOPIC - Abnormal; Notable for the following components:  Color, Urine STRAW (*)    Specific Gravity, Urine 1.036 (*)    Glucose, UA >=500 (*)    Ketones, ur 20 (*)    Protein, ur 30 (*)    All other components within normal limits  CBC WITH DIFFERENTIAL/PLATELET - Abnormal; Notable for the following components:   RBC 4.15 (*)    MCV 108.4 (*)    MCH 36.1 (*)    All other components within normal  limits  GLUCOSE, CAPILLARY - Abnormal; Notable for the following components:   Glucose-Capillary 148 (*)    All other components within normal limits  BASIC METABOLIC PANEL - Abnormal; Notable for the following components:   Chloride 97 (*)    Glucose, Bld 197 (*)    All other components within normal limits  BETA-HYDROXYBUTYRIC ACID - Abnormal; Notable for the following components:   Beta-Hydroxybutyric Acid 2.47 (*)    All other components within normal limits  GLUCOSE, CAPILLARY - Abnormal; Notable for the following components:   Glucose-Capillary 209 (*)    All other components within normal limits  GLUCOSE, CAPILLARY - Abnormal; Notable for the following components:   Glucose-Capillary 176 (*)    All other components within normal limits  CBG MONITORING, ED - Abnormal; Notable for the following components:   Glucose-Capillary 479 (*)    All other components within normal limits  CBG MONITORING, ED - Abnormal; Notable for the following components:   Glucose-Capillary 536 (*)    All other components within normal limits  CBG MONITORING, ED - Abnormal; Notable for the following components:   Glucose-Capillary 528 (*)    All other components within normal limits  CBG MONITORING, ED - Abnormal; Notable for the following components:   Glucose-Capillary 380 (*)    All other components within normal limits  CBG MONITORING, ED - Abnormal; Notable for the following components:   Glucose-Capillary 227 (*)    All other components within normal limits  CBG MONITORING, ED - Abnormal; Notable for the following components:   Glucose-Capillary 171 (*)    All other components within normal limits  CBG MONITORING, ED - Abnormal; Notable for the following components:   Glucose-Capillary 159 (*)    All other components within normal limits  CBG MONITORING, ED - Abnormal; Notable for the following components:   Glucose-Capillary 123 (*)    All other components within normal limits    Past  Medical History:  Diagnosis Date   AKI (acute kidney injury) (HCC) 12/21/2021   Anxiety    Diabetes (HCC)    Pancreatitis    Splenic vein thrombosis     Surgical History:  He  has a past surgical history that includes Upper esophageal endoscopic ultrasound (eus) (N/A, 12/02/2021); Esophagogastroduodenoscopy (egd) with propofol (N/A, 12/02/2021); biopsy (12/02/2021); Endoscopic retrograde cholangiopancreatography (ercp) with propofol (N/A, 08/12/2022); sphincterotomy (08/12/2022); pancreatic stent placement (08/12/2022); Upper esophageal endoscopic ultrasound (eus) (N/A, 12/01/2022); Endoscopic retrograde cholangiopancreatography (ercp) with propofol (N/A, 12/01/2022); Esophagogastroduodenoscopy (N/A, 12/01/2022); Fine needle aspiration (N/A, 12/01/2022); Stent removal (12/01/2022); pancreatic stent placement (12/01/2022); removal of stones (12/01/2022); sphincterotomy (12/01/2022); Biliary dilation (12/01/2022); 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); Endoscopic retrograde cholangiopancreatography (ercp) with propofol (N/A, 02/02/2023); Stent removal (02/02/2023); Biliary dilation (02/02/2023); removal of stones (02/02/2023); Biliary brushing (02/02/2023); pancreatic stent placement (02/02/2023); ERCP (N/A, 04/28/2023); Biliary brushing (04/28/2023); biliary stent placement (04/28/2023); Endoscopic retrograde cholangiopancreatography (ercp) with propofol (N/A, 06/02/2023); Biliary brushing (06/02/2023); removal of stones (06/02/2023); Stent removal (06/02/2023); biliary stent placement (N/A, 06/02/2023); pancreatic stent placement (06/02/2023); Sphincterotomy (06/02/2023); Esophagogastroduodenoscopy (egd) with  propofol (N/A, 07/09/2023); and Stent removal (07/09/2023). Family History:  His family history includes Alcoholism in his maternal uncle; Diabetes in his maternal grandfather, maternal grandmother, paternal grandfather, and paternal grandmother; Hypertension in his mother. Social History:   reports  that he has been smoking cigars. He started smoking about 20 years ago. He has never used smokeless tobacco. He reports that he does not currently use alcohol. He reports that he does not use drugs.  Prior to Admission medications   Medication Sig Start Date End Date Taking? Authorizing Provider  HYDROmorphone (DILAUDID) 4 MG tablet Take 1 tablet (4 mg total) by mouth every 4 (four) hours as needed for pain.  Max daily dose 6 tablets. Patient taking differently: Take 4 mg by mouth 4 (four) times daily. 07/13/23  Yes   insulin aspart (NOVOLOG) 100 UNIT/ML injection 60 units a day Patient taking differently: Inject 0-60 Units into the skin 3 (three) times daily. 12/14/22  Yes [name], [name], MD  lipase/protease/amylase (CREON) 36000 UNITS CPEP capsule Take 1-2 capsules (36,000-72,000 Units total) by mouth See admin instructions. Take 72,000 units by mouth three times a day before meals and 36,000 units with any snacks 06/23/23  Yes [name], [name], MD  multivitamin (ONE-A-DAY MEN'S) TABS tablet Take 1 tablet by mouth daily with breakfast.   Yes [provider]  naloxone (NARCAN) nasal spray 4 mg/0.1 mL Place 1 spray into the nose once as needed (Overdose). 04/05/23  Yes [provider]  omeprazole (PRILOSEC) 40 MG capsule TAKE 1 CAPSULE (40 MG TOTAL) BY MOUTH DAILY. 05/03/23  Yes [name], MD    Current Facility-Administered Medications  Medication Dose Route Frequency Provider Last Rate Last Admin   Chlorhexidine Gluconate Cloth 2 % PADS 6 each  6 each Topical Daily Rolly Salter, MD   6 each at 08/26/23 0835   dextrose 5 % in lactated ringers infusion   Intravenous Continuous Rolly Salter, MD 125 mL/hr at 08/26/23 0924 Infusion Verify at 08/26/23 0924   dextrose 50 % solution 0-50 mL  0-50 mL Intravenous PRN Rolly Salter, MD       HYDROmorphone (DILAUDID) injection 1 mg  1 mg Intravenous Q3H PRN Rolly Salter, MD   1 mg at 08/26/23 0915   insulin  regular, human (MYXREDLIN) 100 units/ 100 mL infusion   Intravenous Continuous Rolly Salter, MD 2 mL/hr at 08/26/23 2130 2 Units/hr at 08/26/23 8657   lactated ringers infusion   Intravenous Continuous Rolly Salter, MD        Allergies as of 08/25/2023 - Review Complete 08/25/2023  Allergen Reaction Noted   Other Anaphylaxis and Other (See Comments) 09/26/2020    Review of Systems:    Constitutional: No weight loss, fever, chills, weakness or fatigue HEENT: Eyes: No change in vision               Ears, Nose, Throat:  No change in hearing or congestion Skin: No rash or itching Cardiovascular: No chest pain, chest pressure or palpitations   Respiratory: No SOB or cough Gastrointestinal: See HPI and otherwise negative Genitourinary: No dysuria or change in urinary frequency Neurological: No headache, dizziness or syncope Musculoskeletal: No new muscle or joint pain Hematologic: No bleeding or bruising Psychiatric: No history of depression or anxiety     Physical Exam:  Vital signs in last 24 hours: Temp:  [97.8 F (36.6 C)-99.1 F (37.3 C)] 97.8 F (36.6 C) (10/18 0839) Pulse Rate:  [86-121]  86 (10/18 0500) Resp:  [18-20] 18 (10/18 0500) BP: (106-136)/(85-93) 122/93 (10/18 0500) SpO2:  [97 %-100 %] 100 % (10/18 0500) Weight:  [62.6 kg] 62.6 kg (10/17 2300)   Last BM recorded by nurses in past 5 days No data recorded  General:   Pleasant, well developed male in no acute distress Head:  Normocephalic and atraumatic. Eyes: sclerae anicteric Heart:  tachycardic, regular rhythm, no murmurs or gallops Pulm: Clear anteriorly; no wheezing Abdomen:  Soft, Flat AB, Active bowel sounds. moderate tenderness, generalized. Without guarding and Without rebound, No organomegaly appreciated. Extremities:  Without edema. Msk:  Symmetrical without gross deformities. Peripheral pulses intact.  Neurologic:  Alert and  oriented x4;  No focal deficits.  Skin:   Dry and intact without  significant lesions or rashes. Psychiatric:  Cooperative. Normal mood and affect.  LAB RESULTS: Recent Labs    08/25/23 1858 08/25/23 2346  WBC 5.2 5.3  HGB 15.1 15.0  HCT 44.1 45.0  PLT 248 205   BMET Recent Labs    08/25/23 2322 08/26/23 0429 08/26/23 0938  NA 129* 132* 138  K 5.0 4.0 3.8  CL 90* 96* 97*  CO2 14* 24 27  GLUCOSE 556* 135* 197*  BUN 12 13 14   CREATININE 1.11 0.83 0.80  CALCIUM 9.5 9.0 9.5   LFT Recent Labs    08/25/23 1858  PROT 8.4*  ALBUMIN 4.6  AST 77*  ALT 133*  ALKPHOS 788*  BILITOT 1.1   PT/INR No results for input(s): "LABPROT", "INR" in the last 72 hours.  STUDIES: CT ABDOMEN PELVIS W CONTRAST  Result Date: 08/25/2023 CLINICAL DATA:  Right upper quadrant pain. History of portal vein thrombosis. Nausea, vomiting. EXAM: CT ABDOMEN AND PELVIS WITH CONTRAST TECHNIQUE: Multidetector CT imaging of the abdomen and pelvis was performed using the standard protocol following bolus administration of intravenous contrast. RADIATION DOSE REDUCTION: This exam was performed according to the departmental dose-optimization program which includes automated exposure control, adjustment of the mA and/or kV according to patient size and/or use of iterative reconstruction technique. CONTRAST:  OMNIPAQUE IOHEXOL 300 MG/ML  SOLN COMPARISON:  08/07/2023 FINDINGS: Lower chest: No acute abnormality Hepatobiliary: No focal liver abnormality. Diffuse low-density throughout the liver compatible with fatty infiltration. Small stones noted within the gallbladder fundus. No biliary ductal dilatation. Biliary stent noted in the common bile duct extending into the small bowel. Chronic portal vein thrombosis with cavernous transformation again noted, stable. Pancreas: Calcifications in the pancreas compatible with chronic pancreatitis. No surrounding inflammation to suggest acute pancreatitis. Spleen: No focal abnormality.  Normal size. Adrenals/Urinary Tract: No adrenal  abnormality. No focal renal abnormality. No stones or hydronephrosis. Urinary bladder is unremarkable. Stomach/Bowel: Multiple small bowel intussusceptions noted, mostly in the right abdomen with 1 noted in the left abdomen as well. There is no evidence of bowel obstruction. Small bowel wall in the intussusception areas appears mildly edematous. Stomach and large bowel unremarkable. Vascular/Lymphatic: No evidence of aneurysm or adenopathy. Reproductive: No visible focal abnormality. Other: No free fluid or free air. Musculoskeletal: No acute bony abnormality. IMPRESSION: Multiple small bowel intussusceptions noted, mostly in the right abdomen. These are of un etiology. No evidence of bowel obstruction. Small bowel wall appears mildly edematous which could be the cause of the intussusceptions or resulting from the intussusceptions. Cholelithiasis. Chronic portal vein thrombosis with cavernous transformation. Hepatic steatosis. These results were called by telephone at the time of interpretation on 08/25/2023 at 11:29 pm to provider [facility] , who verbally  acknowledged these results. Electronically Signed   By: Charlett Nose M.D.   On: 08/25/2023 23:32      Impression /Plan:  37 year old male with a history of chronic pancreatitis with PD stricture s/p PD stent with history of alcohol use disorder (abstinent from alcohol since 09/2020).  Has had multiple procedures/ERCPs with stents. On 07/10/23 seen with EGD and removal of pancreat stent, still has a CBD stent. Presents to ER with worsening abd pain, nausea, and vomiting. Found to have DKA and sugars over 400. 10/17 CT scan shows Multiple small bowel intussusceptions.  Abdominal pain, with nausea and vomiting secondary to possible gastroenteritis?, small bowel intussusceptions on 10/18 Ct. No signs of obstruction. -NPO -IVF's -antiemetics prn, put order for Ondansetron Q8hrs -repeat imaging in the next 1-2 days and if still present, then Camdyn Beske need surgery  to re-evaluate. -Per surgery no recommendations for surgery intervention at this time  Chronic pancreatitis. No evidence on current CT scan.  Opioid dependency due to the treatment of pain for chronic pancreatitis. On home Dilaudid. Reports he takes more frequently than prescribed. Constipation- takes stool softeners at home.  DKA, BS >400s upon arrival to ER. Last Ha1c on 8/28 was 8.3. Poorly controlled DM type 1.  -on insulin pump IV  GERD with hx of esophagitis. On home Omeprazole.  Hyponatremia secondary to DKA. Currently 138. Improving with IV fluid hydration. -continue to monitor  Hx of alcohol abuse. Denies recent use.   Principal Problem:   DKA (diabetic ketoacidosis) (HCC) Active Problems:   Tobacco dependence   Transaminitis   Uncontrolled type 1 diabetes mellitus with hyperglycemia, with long-term current use of insulin (HCC)   Gastroesophageal reflux disease with esophagitis without hemorrhage   History of alcohol abuse   Intussusception intestine (HCC)    LOS: 1 day   Thank you for your kind consultation, we will continue to follow.   Cassidie Veiga J Ajahni Nay  08/26/2023, 10:50 AM

## 2023-08-26 NOTE — Plan of Care (Signed)
[facility] Tonsillectomy/Adenoidectomy, Postoperative PEDS care plan entered in error.

## 2023-08-26 NOTE — Inpatient Diabetes Management (Signed)
Inpatient Diabetes Program Recommendations  AACE/ADA: New Consensus Statement on Inpatient Glycemic Control (2015)  Target Ranges:  Prepandial:   less than 140 mg/dL      Peak postprandial:   less than 180 mg/dL (1-2 hours)      Critically ill patients:  140 - 180 mg/dL   Lab Results  Component Value Date   GLUCAP 211 (H) 08/26/2023   HGBA1C 8.3 (H) 07/06/2023    Review of Glycemic Control  Diabetes history: DM1 Outpatient Diabetes medications: OmniPod (has not used in a month or so)  Lantus 16 units QAM, Novolog s/s (usually 2-5 units TID Current orders for Inpatient glycemic control: IV insulin per [facility] for DKA  HgbA1C - 8.3%  Omnipod 5 with G6  Basal 0.5/hr  ICR 1:1- #6  ISF 50  Target 110   Inpatient Diabetes Program Recommendations:    Give Semglee 1-2H prior to discontinuation of drip.  Semglee 16 units Q24H  Novolog 0-9 units TID with meals and 0-5 HS   Add Novolog 2 units TID when taking po's and eating > 50%  Pt states he purchased new phone approx 1 month ago and OmniPod would not connect PDM (receiver) to phone. Started giving himself SQ insulin - Lantus 16 QAM and Novolog s/s. Has appt with Endo on 09/19/23. Will likely be able to restart pump after office programs his phone. States his blood sugar had gone up yesterday d/t missing 2 doses of his Novolog. Never missed the Lantus dose. Has Dexcom G-6. No issues with obtaining insulin or supplies. Pt also states his diet has been "pretty bad" and he knows he needs to improve it. States he knows what to do, he's just been lazy about food choices.   Needs One Touch meter strips at discharge.  Has all insulin and supplies. Appt with Endo on 09/19/23.  Will continue to follow.  Thank you. Ailene Ards, RD, LDN, CDCES Inpatient Diabetes Coordinator [phone]

## 2023-08-26 NOTE — ED Notes (Signed)
ED TO INPATIENT HANDOFF REPORT  Name/Age/Gender Carlos [name] 37 y.o. male  Code Status    Code Status Orders  (From admission, onward)           Start     Ordered   08/25/23 2326  Full code  Continuous       Question:  By:  Answer:  Consent: discussion documented in EHR   08/25/23 2332           Code Status History     Date Active Date Inactive Code Status Order ID Comments User Context   08/07/2023 0942 08/10/2023 2335 Full Code [id number]  Maryln Gottron, MD Inpatient   07/08/2023 0748 07/10/2023 1945 Full Code [id number]  Maryln Gottron, MD Inpatient   04/22/2023 1923 04/30/2023 1839 Full Code [id number]  Briscoe Deutscher, MD ED   02/03/2023 1536 02/05/2023 2022 Full Code [id number]  Maryln Gottron, MD ED   11/11/2022 1241 11/12/2022 2030 Full Code [id number]  Teddy Spike, DO ED   08/14/2022 0016 08/19/2022 1733 Full Code [id number]  Tu, Sunny Schlein, DO ED   07/20/2022 0907 07/22/2022 2244 Full Code [id number]  Clydie Braun, MD ED   07/20/2022 0200 07/20/2022 0907 Full Code [id number]  Howerter, Chaney Born, DO ED   04/27/2022 0202 04/30/2022 1757 Full Code [id number]  Marinda Elk, MD ED   01/30/2022 1716 02/01/2022 1859 Full Code [id number]  Lanae Boast, MD ED   12/21/2021 0306 12/22/2021 1806 Full Code [id number]  Howerter, Chaney Born, DO ED   12/07/2021 1255 12/10/2021 2043 Full Code [id number]  Clydie Braun, MD ED   09/14/2021 0009 09/16/2021 2101 Full Code [id number]  Marguerita Merles Lindon, DO Inpatient   09/26/2020 1040 10/07/2020 2034 Full Code [id number]  Jonah Blue, MD ED       Home/SNF/Other Home  Chief Complaint DKA (diabetic ketoacidosis) (HCC) [E11.10]  Level of Care/Admitting Diagnosis ED Disposition     ED Disposition  Admit   Condition  --   Comment  Hospital Area: [facility] [[id number]]  Level of Care: Stepdown [14]  Admit to SDU based on following criteria: Hemodynamic compromise or significant risk of instability:  Patient requiring short  term acute titration and management of vasoactive drips, and invasive monitoring (i.e., CVP and Arterial line).  May admit patient to [facility] or [facility] if equivalent level of care is available:: Yes  Covid Evaluation: Asymptomatic - no recent exposure (last 10 days) testing not required  Diagnosis: DKA (diabetic ketoacidosis) (HCC) [009381]  Admitting Physician: Rometta Emery [[id number]]  Attending Physician: Rometta Emery [[id number]]  Certification:: I certify this patient will need inpatient services for at least 2 midnights  Expected Medical Readiness: 08/29/2023          Medical History Past Medical History:  Diagnosis Date   AKI (acute kidney injury) (HCC) 12/21/2021   Anxiety    Diabetes (HCC)    Pancreatitis    Splenic vein thrombosis     Allergies Allergies  Allergen Reactions   Other Anaphylaxis and Other (See Comments)    [name] - anaphylaxis    IV Location/Drains/Wounds Patient Lines/Drains/Airways Status     Active Line/Drains/Airways     Name Placement date Placement time Site Days   Peripheral IV 08/25/23 22 G Left Antecubital 08/25/23  2245  Antecubital  1   GI Stent 07/03/23  0700  --  54            Labs/Imaging Results  for orders placed or performed during the hospital encounter of 08/25/23 (from the past 48 hour(s))  CBG monitoring, ED     Status: Abnormal   Collection Time: 08/25/23  9:54 PM  Result Value Ref Range   Glucose-Capillary 479 (H) 70 - 99 mg/dL    Comment: Glucose reference range applies only to samples taken after fasting for at least 8 hours.  Blood gas, venous     Status: Abnormal   Collection Time: 08/25/23 10:48 PM  Result Value Ref Range   pH, Ven 7.31 7.25 - 7.43   pCO2, Ven 35 (L) 44 - 60 mmHg   pO2, Ven 43 32 - 45 mmHg   Bicarbonate 17.6 (L) 20.0 - 28.0 mmol/L   Acid-base deficit 7.8 (H) 0.0 - 2.0 mmol/L   O2 Saturation 70.7 %   Patient temperature 37.0     Comment: Performed at [facility], [address] [place], [place] [address]  Beta-hydroxybutyric acid     Status: Abnormal   Collection Time: 08/25/23 10:48 PM  Result Value Ref Range   Beta-Hydroxybutyric Acid >8.00 (H) 0.05 - 0.27 mmol/L    Comment: RESULT CONFIRMED BY MANUAL DILUTION Performed at [facility], [address], [place], [place] [address]   Basic metabolic panel     Status: Abnormal   Collection Time: 08/25/23 11:22 PM  Result Value Ref Range   Sodium 129 (L) 135 - 145 mmol/L   Potassium 5.0 3.5 - 5.1 mmol/L   Chloride 90 (L) 98 - 111 mmol/L   CO2 14 (L) 22 - 32 mmol/L   Glucose, Bld 556 (HH) 70 - 99 mg/dL    Comment: CRITICAL RESULT CALLED TO, READ BACK BY AND VERIFIED WITH RIVERS,TJ RN @ 2359 08/25/23 BY CHILDRESS,E Glucose reference range applies only to samples taken after fasting for at least 8 hours.    BUN 12 6 - 20 mg/dL   Creatinine, Ser 1.32 0.61 - 1.24 mg/dL   Calcium 9.5 8.9 - 44.0 mg/dL   GFR, Estimated >10 >27 mL/min    Comment: (NOTE) Calculated using the CKD-EPI Creatinine Equation (2021)    Anion gap 25 (H) 5 - 15    Comment: ELECTROLYTES REPEATED TO VERIFY Performed at [facility], [address], [place], [place] [address]   Urinalysis, Routine w reflex microscopic -Urine, Clean Catch     Status: Abnormal   Collection Time: 08/25/23 11:31 PM  Result Value Ref Range   Color, Urine STRAW (A) YELLOW   APPearance CLEAR CLEAR   Specific Gravity, Urine 1.036 (H) 1.005 - 1.030   pH 5.0 5.0 - 8.0   Glucose, UA >=500 (A) NEGATIVE mg/dL   Hgb urine dipstick NEGATIVE NEGATIVE   Bilirubin Urine NEGATIVE NEGATIVE   Ketones, ur 20 (A) NEGATIVE mg/dL   Protein, ur 30 (A) NEGATIVE mg/dL   Nitrite NEGATIVE NEGATIVE   Leukocytes,Ua NEGATIVE NEGATIVE   RBC / HPF 0-5 0 - 5 RBC/hpf   WBC, UA 0-5 0 - 5 WBC/hpf   Bacteria, UA NONE SEEN NONE SEEN   Squamous Epithelial / HPF 0-5 0 - 5 /HPF    Comment: Performed at [facility], [address], [place], [place] [address]  Rapid urine drug screen (hospital performed)     Status: None   Collection Time: 08/25/23 11:31 PM  Result Value Ref Range   Opiates NONE DETECTED NONE DETECTED   Cocaine NONE DETECTED NONE DETECTED   Benzodiazepines  NONE DETECTED NONE DETECTED   Amphetamines NONE DETECTED NONE DETECTED   Tetrahydrocannabinol NONE DETECTED NONE DETECTED   Barbiturates NONE DETECTED NONE DETECTED    Comment: (NOTE) DRUG SCREEN FOR MEDICAL PURPOSES ONLY.  IF CONFIRMATION IS NEEDED FOR ANY PURPOSE, NOTIFY LAB WITHIN 5 DAYS.  LOWEST DETECTABLE LIMITS FOR URINE DRUG SCREEN Drug Class                     Cutoff (ng/mL) Amphetamine and metabolites    1000 Barbiturate and metabolites    200 Benzodiazepine                 200 Opiates and metabolites        300 Cocaine and metabolites        300 THC                            50 Performed at [facility], [address] [place], [place] [address]   CBG monitoring, ED     Status: Abnormal   Collection Time: 08/25/23 11:44 PM  Result Value Ref Range   Glucose-Capillary 536 (HH) 70 - 99 mg/dL    Comment: Glucose reference range applies only to samples taken after fasting for at least 8 hours.  Ethanol     Status: None   Collection Time: 08/25/23 11:46 PM  Result Value Ref Range   Alcohol, Ethyl (B) <10 <10 mg/dL    Comment: (NOTE) Lowest detectable limit for serum alcohol is 10 mg/dL.  For medical purposes only. Performed at [facility], [address], [place], [place] [address]   CBC with Differential/Platelet     Status: Abnormal   Collection Time: 08/25/23 11:46 PM  Result Value Ref Range   WBC 5.3 4.0 - 10.5 K/uL   RBC 4.15 (L) 4.22 - 5.81 MIL/uL   Hemoglobin 15.0 13.0 - 17.0 g/dL   HCT 09.8 11.9 - 14.7 %   MCV 108.4 (H) 80.0 - 100.0 fL   MCH 36.1 (H) 26.0 - 34.0 pg   MCHC 33.3 30.0 - 36.0 g/dL   RDW 82.9 56.2 - 13.0 %   Platelets 205 150 -  400 K/uL   nRBC 0.0 0.0 - 0.2 %   Neutrophils Relative % 66 %   Neutro Abs 3.5 1.7 - 7.7 K/uL   Lymphocytes Relative 19 %   Lymphs Abs 1.0 0.7 - 4.0 K/uL   Monocytes Relative 14 %   Monocytes Absolute 0.7 0.1 - 1.0 K/uL   Eosinophils Relative 1 %   Eosinophils Absolute 0.1 0.0 - 0.5 K/uL   Basophils Relative 0 %   Basophils Absolute 0.0 0.0 - 0.1 K/uL   Immature Granulocytes 0 %   Abs Immature Granulocytes 0.02 0.00 - 0.07 K/uL    Comment: Performed at [facility], [address], [place], [place] [address]  CBG monitoring, ED     Status: Abnormal   Collection Time: 08/26/23 12:27 AM  Result Value Ref Range   Glucose-Capillary 528 (HH) 70 - 99 mg/dL    Comment: Glucose reference range applies only to samples taken after fasting for at least 8 hours.   Comment 1 Notify RN   CBG monitoring, ED     Status: Abnormal   Collection Time: 08/26/23 12:53 AM  Result Value Ref Range   Glucose-Capillary 380 (H) 70 - 99 mg/dL    Comment: Glucose reference range applies  only to samples taken after fasting for at least 8 hours.  CBG monitoring, ED     Status: Abnormal   Collection Time: 08/26/23  2:12 AM  Result Value Ref Range   Glucose-Capillary 227 (H) 70 - 99 mg/dL    Comment: Glucose reference range applies only to samples taken after fasting for at least 8 hours.  CBG monitoring, ED     Status: Abnormal   Collection Time: 08/26/23  3:03 AM  Result Value Ref Range   Glucose-Capillary 171 (H) 70 - 99 mg/dL    Comment: Glucose reference range applies only to samples taken after fasting for at least 8 hours.  CBG monitoring, ED     Status: Abnormal   Collection Time: 08/26/23  4:17 AM  Result Value Ref Range   Glucose-Capillary 159 (H) 70 - 99 mg/dL    Comment: Glucose reference range applies only to samples taken after fasting for at least 8 hours.  Basic metabolic panel     Status: Abnormal   Collection Time: 08/26/23  4:29 AM  Result Value Ref Range   Sodium 132  (L) 135 - 145 mmol/L   Potassium 4.0 3.5 - 5.1 mmol/L   Chloride 96 (L) 98 - 111 mmol/L   CO2 24 22 - 32 mmol/L   Glucose, Bld 135 (H) 70 - 99 mg/dL    Comment: Glucose reference range applies only to samples taken after fasting for at least 8 hours.   BUN 13 6 - 20 mg/dL   Creatinine, Ser 1.61 0.61 - 1.24 mg/dL   Calcium 9.0 8.9 - 09.6 mg/dL   GFR, Estimated >04 >54 mL/min    Comment: (NOTE) Calculated using the CKD-EPI Creatinine Equation (2021)    Anion gap 12 5 - 15    Comment: Performed at [facility], [address], [place], [place] [address]  CBG monitoring, ED     Status: Abnormal   Collection Time: 08/26/23  5:44 AM  Result Value Ref Range   Glucose-Capillary 123 (H) 70 - 99 mg/dL    Comment: Glucose reference range applies only to samples taken after fasting for at least 8 hours.   CT ABDOMEN PELVIS W CONTRAST  Result Date: 08/25/2023 CLINICAL DATA:  Right upper quadrant pain. History of portal vein thrombosis. Nausea, vomiting. EXAM: CT ABDOMEN AND PELVIS WITH CONTRAST TECHNIQUE: Multidetector CT imaging of the abdomen and pelvis was performed using the standard protocol following bolus administration of intravenous contrast. RADIATION DOSE REDUCTION: This exam was performed according to the departmental dose-optimization program which includes automated exposure control, adjustment of the mA and/or kV according to patient size and/or use of iterative reconstruction technique. CONTRAST:  OMNIPAQUE IOHEXOL 300 MG/ML  SOLN COMPARISON:  08/07/2023 FINDINGS: Lower chest: No acute abnormality Hepatobiliary: No focal liver abnormality. Diffuse low-density throughout the liver compatible with fatty infiltration. Small stones noted within the gallbladder fundus. No biliary ductal dilatation. Biliary stent noted in the common bile duct extending into the small bowel. Chronic portal vein thrombosis with cavernous transformation again noted, stable. Pancreas:  Calcifications in the pancreas compatible with chronic pancreatitis. No surrounding inflammation to suggest acute pancreatitis. Spleen: No focal abnormality.  Normal size. Adrenals/Urinary Tract: No adrenal abnormality. No focal renal abnormality. No stones or hydronephrosis. Urinary bladder is unremarkable. Stomach/Bowel: Multiple small bowel intussusceptions noted, mostly in the right abdomen with 1 noted in the left abdomen as well. There is no evidence of bowel obstruction. Small bowel wall in the intussusception areas  appears mildly edematous. Stomach and large bowel unremarkable. Vascular/Lymphatic: No evidence of aneurysm or adenopathy. Reproductive: No visible focal abnormality. Other: No free fluid or free air. Musculoskeletal: No acute bony abnormality. IMPRESSION: Multiple small bowel intussusceptions noted, mostly in the right abdomen. These are of un etiology. No evidence of bowel obstruction. Small bowel wall appears mildly edematous which could be the cause of the intussusceptions or resulting from the intussusceptions. Cholelithiasis. Chronic portal vein thrombosis with cavernous transformation. Hepatic steatosis. These results were called by telephone at the time of interpretation on 08/25/2023 at 11:29 pm to provider [facility] , who verbally acknowledged these results. Electronically Signed   By: Charlett Nose M.D.   On: 08/25/2023 23:32    Pending Labs Unresulted Labs (From admission, onward)     Start     Ordered   09/01/23 0500  Creatinine, serum  (enoxaparin (LOVENOX)    CrCl >/= 30 ml/min)  Weekly,   R     Comments: while on enoxaparin therapy    08/26/23 0119   08/25/23 2309  Basic metabolic panel  (Diabetes Ketoacidosis (DKA))  STAT Now then every 4 hours ,   STAT      08/25/23 2309   08/25/23 2309  CBC with Differential (PNL)  (Diabetes Ketoacidosis (DKA))  ONCE - STAT,   STAT        08/25/23 2309   Signed and Held  Basic metabolic panel  (Diabetes Ketoacidosis (DKA))  STAT  Now then every 4 hours ,   R      Signed and Held            Vitals/Pain Today's Vitals   08/26/23 0234 08/26/23 0329 08/26/23 0500 08/26/23 0630  BP:   (!) 122/93   Pulse:   86   Resp:   18   Temp:    98.1 F (36.7 C)  TempSrc:    Oral  SpO2:   100%   Weight:      PainSc: 7  4       Isolation Precautions No active isolations  Medications Medications  insulin regular, human (MYXREDLIN) 100 units/ 100 mL infusion (4.6 Units/hr Intravenous Rate/Dose Change 08/26/23 0103)  lactated ringers infusion ( Intravenous New Bag/Given 08/26/23 0028)  dextrose 5 % in lactated ringers infusion (has no administration in time range)  dextrose 50 % solution 0-50 mL (has no administration in time range)  enoxaparin (LOVENOX) injection 40 mg (has no administration in time range)  lactated ringers bolus 1,252 mL (has no administration in time range)  insulin regular, human (MYXREDLIN) 100 units/ 100 mL infusion (has no administration in time range)  lactated ringers infusion ( Intravenous New Bag/Given 08/26/23 0250)  dextrose 5 % in lactated ringers infusion (has no administration in time range)  HYDROmorphone (DILAUDID) injection 1 mg (1 mg Intravenous Given 08/26/23 0247)  morphine (PF) 4 MG/ML injection 4 mg (4 mg Intravenous Given 08/25/23 2330)  iohexol (OMNIPAQUE) 300 MG/ML solution 100 mL (100 mLs Intravenous Contrast Given 08/25/23 2308)  lactated ringers bolus 1,252 mL (0 mLs Intravenous Stopped 08/26/23 0244)  potassium chloride 10 mEq in 100 mL IVPB (0 mEq Intravenous Stopped 08/26/23 0436)  potassium chloride 10 mEq in 100 mL IVPB (10 mEq Intravenous New Bag/Given 08/26/23 0437)    Mobility walks

## 2023-08-26 NOTE — Progress Notes (Addendum)
[facility] Progress Note Patient: Carlos [name] WUJ:[id number] DOB: 1986/08/16 DOA: 08/25/2023  DOS: the patient was seen and examined on 08/26/2023  Brief hospital course: PMH of type I DM, chronic recurrent pancreatitis with chronic pain, history of alcohol abuse, anxiety presented to the hospital with complaints of abdominal pain and hyperglycemia. Found to have DKA as well as intussusception. GI and general surgery following.  DKA currently treated with IV insulin.  Assessment and Plan: Diabetic ketoacidosis. Type 1 diabetes mellitus uncontrolled with hyperglycemia with long-term insulin use With gastroparesis. Elevated beta-hydroxybutyrate calcium as well as anion gap of 29. Treated with IV fluid IV potassium as well as IV insulin therapy. Anion gap is closed although beta-hydroxybutyrate acid was elevated. Currently transitioning to basal bolus regimen again. Keeping n.p.o. and therefore will remain on every 4 hours sliding scale insulin. Also given lower dose of insulin long-acting Lantus instead of his home regimen of 16 units for the same reason. Continue with IV hydration although switching from D5 LR to just LR.  Multiple small bowel intussusception. Primarily presented with abdominal pain. CT scan shows evidence of an Intussusception. GI was consulted as well as general surgery. Currently conservative management with n.p.o. status recommended. Will monitor closely.  Chronically elevated LFT. Monitor for now.  Chronic pancreatitis. At present under control.  Will monitor. Appreciate GI consultation.  Chronic pain syndrome. On Dilaudid 4 mg every 4 hours as needed. Continue Dilaudid 0.5 to 1 mg as needed.  Alcohol abuse. Denies any active drinking. Currently no withdrawals. Will monitor.  Smoker. Nicotine patch.   Underweight. Body mass index is 16.37 kg/m.  Placing the patient at high risk for poor outcome.  Subjective: Abdominal pain present.   Improving.  No nausea no vomiting.  Passing gas.  Had a BM.  Physical Exam: Moderate distress.  No rash. Clear to auscultation Bowel sound present. Diffusely tender. No edema.  Data Reviewed: I have Reviewed nursing notes, Vitals, and Lab results. Since last encounter, pertinent lab results CBC and BMP   . I have ordered test including CBC and BMP  . I have discussed pt's care plan and test results with general surgery  .    Disposition: Status is: Inpatient Remains inpatient appropriate because: Awaiting further workup and improvement  Place and maintain sequential compression device Start: 08/26/23 1126   Family Communication: No one at bedside Level of care: Stepdown will continue overnight in stepdown unit.  Transfer out in the morning. Vitals:   08/26/23 1600 08/26/23 1626 08/26/23 1700 08/26/23 1800  BP: (!) 122/92  124/84 128/74  Pulse: 90  80 75  Resp: 18  (!) 8 12  Temp:  97.9 F (36.6 C)    TempSrc:  Oral    SpO2: 99%  96% 96%  Weight:       The patient is critically ill with multiple organ systems failure and requires high complexity decision making for assessment and support, frequent evaluation and titration of therapies. Critical Care Time devoted to patient care services described in this note is 35 minutes   Author: Lynden Oxford, MD 08/26/2023 6:19 PM  Please look on [email or web address] to find out who is on call.

## 2023-08-26 NOTE — Hospital Course (Signed)
Brief hospital course: PMH of type I DM, chronic recurrent pancreatitis with chronic pain, history of alcohol abuse, anxiety presented to the hospital with complaints of abdominal pain and hyperglycemia. Found to have DKA as well as intussusception. GI and general surgery following.  DKA currently treated with IV insulin.  Assessment and Plan: Diabetic ketoacidosis. Type 1 diabetes mellitus uncontrolled with hyperglycemia with long-term insulin use With gastroparesis. Elevated beta-hydroxybutyrate calcium as well as anion gap of 29. Treated with IV fluid IV potassium as well as IV insulin therapy. Anion gap is closed although beta-hydroxybutyrate acid was elevated. Currently transitioning to basal bolus regimen again. Will advance to carb modified diet tomorrow. Will be switching to sliding scale and home regimen of Lantus.  Multiple small bowel intussusception. Primarily presented with abdominal pain. CT scan shows evidence of an Intussusception. GI was consulted as well as general surgery. Currently conservative management  Diet advancement per surgery.  X-ray reassuring. Will monitor closely.  Chronically elevated LFT. Monitor for now.  Chronic pancreatitis. At present under control.  Will monitor. Appreciate GI consultation.  Chronic pain syndrome. On Dilaudid 4 mg every 4 hours as needed. Continue Dilaudid 0.5 to 1 mg as needed.  Alcohol abuse. Denies any active drinking. Currently no withdrawals. Will monitor.  Smoker. Nicotine patch.  Underweight. Body mass index is 16.37 kg/m.  Placing the patient at a high risk of poor outcome.

## 2023-08-27 ENCOUNTER — Inpatient Hospital Stay (HOSPITAL_COMMUNITY): Payer: Medicaid Other

## 2023-08-27 DIAGNOSIS — Z9889 Other specified postprocedural states: Secondary | ICD-10-CM

## 2023-08-27 DIAGNOSIS — E1010 Type 1 diabetes mellitus with ketoacidosis without coma: Secondary | ICD-10-CM | POA: Diagnosis not present

## 2023-08-27 DIAGNOSIS — R1084 Generalized abdominal pain: Secondary | ICD-10-CM

## 2023-08-27 LAB — CBC
HCT: 37.5 % — ABNORMAL LOW (ref 39.0–52.0)
Hemoglobin: 12.8 g/dL — ABNORMAL LOW (ref 13.0–17.0)
MCH: 36.3 pg — ABNORMAL HIGH (ref 26.0–34.0)
MCHC: 34.1 g/dL (ref 30.0–36.0)
MCV: 106.2 fL — ABNORMAL HIGH (ref 80.0–100.0)
Platelets: 142 10*3/uL — ABNORMAL LOW (ref 150–400)
RBC: 3.53 MIL/uL — ABNORMAL LOW (ref 4.22–5.81)
RDW: 11.6 % (ref 11.5–15.5)
WBC: 3.9 10*3/uL — ABNORMAL LOW (ref 4.0–10.5)
nRBC: 0 % (ref 0.0–0.2)

## 2023-08-27 LAB — BASIC METABOLIC PANEL
Anion gap: 10 (ref 5–15)
Anion gap: 10 (ref 5–15)
BUN: 17 mg/dL (ref 6–20)
BUN: 15 mg/dL (ref 6–20)
CO2: 26 mmol/L (ref 22–32)
CO2: 27 mmol/L (ref 22–32)
Calcium: 9 mg/dL (ref 8.9–10.3)
Calcium: 9.2 mg/dL (ref 8.9–10.3)
Chloride: 99 mmol/L (ref 98–111)
Chloride: 101 mmol/L (ref 98–111)
Creatinine, Ser: 0.74 mg/dL (ref 0.61–1.24)
Creatinine, Ser: 0.64 mg/dL (ref 0.61–1.24)
GFR, Estimated: >60 mL/min (ref >60)
GFR, Estimated: >60 mL/min (ref >60)
Glucose, Bld: 181 mg/dL — ABNORMAL HIGH (ref 70–99)
Glucose, Bld: 89 mg/dL (ref 70–99)
Potassium: 3.7 mmol/L (ref 3.5–5.1)
Potassium: 3.2 mmol/L — ABNORMAL LOW (ref 3.5–5.1)
Sodium: 135 mmol/L (ref 135–145)
Sodium: 138 mmol/L (ref 135–145)

## 2023-08-27 LAB — GLUCOSE, CAPILLARY
Glucose-Capillary: 152 mg/dL — ABNORMAL HIGH (ref 70–99)
Glucose-Capillary: 133 mg/dL — ABNORMAL HIGH (ref 70–99)
Glucose-Capillary: 186 mg/dL — ABNORMAL HIGH (ref 70–99)
Glucose-Capillary: 268 mg/dL — ABNORMAL HIGH (ref 70–99)
Glucose-Capillary: 377 mg/dL — ABNORMAL HIGH (ref 70–99)

## 2023-08-27 LAB — BETA-HYDROXYBUTYRIC ACID: Beta-Hydroxybutyric Acid: 0.76 mmol/L — ABNORMAL HIGH (ref 0.05–0.27)

## 2023-08-27 MED ORDER — INSULIN ASPART 100 UNIT/ML IJ SOLN
2.0000 [IU] | Freq: Three times a day (TID) | INTRAMUSCULAR | Status: DC
  Administered 2023-08-27 – 2023-08-28 (×3): 2 [IU] via SUBCUTANEOUS

## 2023-08-27 MED ORDER — INSULIN ASPART 100 UNIT/ML IJ SOLN
0.0000 [IU] | Freq: Three times a day (TID) | INTRAMUSCULAR | Status: DC
  Administered 2023-08-27: 5 [IU] via SUBCUTANEOUS
  Administered 2023-08-27: 2 [IU] via SUBCUTANEOUS
  Administered 2023-08-28: 5 [IU] via SUBCUTANEOUS
  Administered 2023-08-28: 2 [IU] via SUBCUTANEOUS
  Administered 2023-08-28: 3 [IU] via SUBCUTANEOUS
  Administered 2023-08-29: 2 [IU] via SUBCUTANEOUS

## 2023-08-27 MED ORDER — INSULIN GLARGINE-YFGN 100 UNIT/ML ~~LOC~~ SOLN
10.0000 [IU] | Freq: Every day | SUBCUTANEOUS | Status: DC
  Administered 2023-08-27: 10 [IU] via SUBCUTANEOUS
  Filled 2023-08-27: qty 0.1

## 2023-08-27 MED ORDER — INSULIN ASPART 100 UNIT/ML IJ SOLN
0.0000 [IU] | Freq: Every day | INTRAMUSCULAR | Status: DC
  Administered 2023-08-27 – 2023-08-28 (×2): 5 [IU] via SUBCUTANEOUS

## 2023-08-27 MED ORDER — LACTATED RINGERS IV SOLN: INTRAVENOUS | Status: DC

## 2023-08-27 MED ORDER — ORAL CARE MOUTH RINSE: 15.0000 mL | OROMUCOSAL | Status: DC | PRN

## 2023-08-27 NOTE — Plan of Care (Signed)
Received patient from [facility]. Patient is alert and orient x4. C/o abdominal pain managed with PRN med's. Denies nausea and vomiting. Up ambulating in hallway.  Safety precautions maintained.  Problem: Education: Goal: Ability to describe self-care measures that may prevent or decrease complications (Diabetes Survival Skills Education) will improve Outcome: Progressing Goal: Individualized Educational Video(s) Outcome: Progressing   Problem: Coping: Goal: Ability to adjust to condition or change in health will improve Outcome: Progressing   Problem: Fluid Volume: Goal: Ability to maintain a balanced intake and output will improve Outcome: Progressing   Problem: Health Behavior/Discharge Planning: Goal: Ability to identify and utilize available resources and services will improve Outcome: Progressing Goal: Ability to manage health-related needs will improve Outcome: Progressing   Problem: Metabolic: Goal: Ability to maintain appropriate glucose levels will improve Outcome: Progressing   Problem: Nutritional: Goal: Maintenance of adequate nutrition will improve Outcome: Progressing Goal: Progress toward achieving an optimal weight will improve Outcome: Progressing   Problem: Skin Integrity: Goal: Risk for impaired skin integrity will decrease Outcome: Progressing   Problem: Tissue Perfusion: Goal: Adequacy of tissue perfusion will improve Outcome: Progressing   Problem: Education: Goal: Ability to describe self-care measures that may prevent or decrease complications (Diabetes Survival Skills Education) will improve Outcome: Progressing Goal: Individualized Educational Video(s) Outcome: Progressing   Problem: Cardiac: Goal: Ability to maintain an adequate cardiac output will improve Outcome: Progressing   Problem: Health Behavior/Discharge Planning: Goal: Ability to identify and utilize available resources and services will improve Outcome: Progressing Goal: Ability  to manage health-related needs will improve Outcome: Progressing   Problem: Fluid Volume: Goal: Ability to achieve a balanced intake and output will improve Outcome: Progressing   Problem: Metabolic: Goal: Ability to maintain appropriate glucose levels will improve Outcome: Progressing   Problem: Nutritional: Goal: Maintenance of adequate nutrition will improve Outcome: Progressing Goal: Maintenance of adequate weight for body size and type will improve Outcome: Progressing   Problem: Respiratory: Goal: Will regain and/or maintain adequate ventilation Outcome: Progressing   Problem: Urinary Elimination: Goal: Ability to achieve and maintain adequate renal perfusion and functioning will improve Outcome: Progressing   Problem: Education: Goal: Knowledge of General Education information will improve Description: Including pain rating scale, medication(s)/side effects and non-pharmacologic comfort measures Outcome: Progressing   Problem: Health Behavior/Discharge Planning: Goal: Ability to manage health-related needs will improve Outcome: Progressing   Problem: Clinical Measurements: Goal: Ability to maintain clinical measurements within normal limits will improve Outcome: Progressing Goal: Will remain free from infection Outcome: Progressing Goal: Diagnostic test results will improve Outcome: Progressing Goal: Respiratory complications will improve Outcome: Progressing Goal: Cardiovascular complication will be avoided Outcome: Progressing   Problem: Activity: Goal: Risk for activity intolerance will decrease Outcome: Progressing   Problem: Nutrition: Goal: Adequate nutrition will be maintained Outcome: Progressing   Problem: Coping: Goal: Level of anxiety will decrease Outcome: Progressing   Problem: Elimination: Goal: Will not experience complications related to bowel motility Outcome: Progressing Goal: Will not experience complications related to urinary  retention Outcome: Progressing   Problem: Pain Managment: Goal: General experience of comfort will improve Outcome: Progressing   Problem: Safety: Goal: Ability to remain free from injury will improve Outcome: Progressing   Problem: Skin Integrity: Goal: Risk for impaired skin integrity will decrease Outcome: Progressing

## 2023-08-27 NOTE — Progress Notes (Signed)
Subjective/Chief Complaint: Pt with some abd soreness No nausea    Objective: Vital signs in last 24 hours: Temp:  [97.6 F (36.4 C)-97.9 F (36.6 C)] 97.9 F (36.6 C) (10/18 1626) Pulse Rate:  [64-96] 71 (10/19 0500) Resp:  [7-24] 10 (10/19 0500) BP: (112-137)/(73-97) 123/85 (10/19 0500) SpO2:  [96 %-100 %] 97 % (10/19 0500)    Intake/Output from previous day: 10/18 0701 - 10/19 0700 In: 3380.7 [I.V.:3272.8; IV Piggyback:108] Out: 200 [Urine:200] Intake/Output this shift: No intake/output data recorded.  PE:  Constitutional: No acute distress, conversant, appears states age. Eyes: Anicteric sclerae, moist conjunctiva, no lid lag Lungs: Clear to auscultation bilaterally, normal respiratory effort CV: regular rate and rhythm, no murmurs, no peripheral edema, pedal pulses 2+ GI: Soft, no masses or hepatosplenomegaly, min-tender to palpation Skin: No rashes, palpation reveals normal turgor Psychiatric: appropriate judgment and insight, oriented to person, place, and time   Lab Results:  Recent Labs    08/25/23 2346 08/27/23 0639  WBC 5.3 3.9*  HGB 15.0 12.8*  HCT 45.0 37.5*  PLT 205 142*   BMET Recent Labs    08/27/23 0041 08/27/23 0639  NA 135 138  K 3.7 3.2*  CL 99 101  CO2 26 27  GLUCOSE 181* 89  BUN 17 15  CREATININE 0.74 0.64  CALCIUM 9.0 9.2   PT/INR No results for input(s): "LABPROT", "INR" in the last 72 hours. ABG Recent Labs    08/25/23 2248  HCO3 17.6*    Studies/Results: CT ABDOMEN PELVIS W CONTRAST  Result Date: 08/25/2023 CLINICAL DATA:  Right upper quadrant pain. History of portal vein thrombosis. Nausea, vomiting. EXAM: CT ABDOMEN AND PELVIS WITH CONTRAST TECHNIQUE: Multidetector CT imaging of the abdomen and pelvis was performed using the standard protocol following bolus administration of intravenous contrast. RADIATION DOSE REDUCTION: This exam was performed according to the departmental dose-optimization program which  includes automated exposure control, adjustment of the mA and/or kV according to patient size and/or use of iterative reconstruction technique. CONTRAST:  OMNIPAQUE IOHEXOL 300 MG/ML  SOLN COMPARISON:  08/07/2023 FINDINGS: Lower chest: No acute abnormality Hepatobiliary: No focal liver abnormality. Diffuse low-density throughout the liver compatible with fatty infiltration. Small stones noted within the gallbladder fundus. No biliary ductal dilatation. Biliary stent noted in the common bile duct extending into the small bowel. Chronic portal vein thrombosis with cavernous transformation again noted, stable. Pancreas: Calcifications in the pancreas compatible with chronic pancreatitis. No surrounding inflammation to suggest acute pancreatitis. Spleen: No focal abnormality.  Normal size. Adrenals/Urinary Tract: No adrenal abnormality. No focal renal abnormality. No stones or hydronephrosis. Urinary bladder is unremarkable. Stomach/Bowel: Multiple small bowel intussusceptions noted, mostly in the right abdomen with 1 noted in the left abdomen as well. There is no evidence of bowel obstruction. Small bowel wall in the intussusception areas appears mildly edematous. Stomach and large bowel unremarkable. Vascular/Lymphatic: No evidence of aneurysm or adenopathy. Reproductive: No visible focal abnormality. Other: No free fluid or free air. Musculoskeletal: No acute bony abnormality. IMPRESSION: Multiple small bowel intussusceptions noted, mostly in the right abdomen. These are of un etiology. No evidence of bowel obstruction. Small bowel wall appears mildly edematous which could be the cause of the intussusceptions or resulting from the intussusceptions. Cholelithiasis. Chronic portal vein thrombosis with cavernous transformation. Hepatic steatosis. These results were called by telephone at the time of interpretation on 08/25/2023 at 11:29 pm to provider [facility] , who verbally acknowledged these results.  Electronically Signed   By: Caryn Bee  Dover M.D.   On: 08/25/2023 23:32    Assessment/Plan: 37 year old male with small bowel intussusception on CT scan without obstruction  chronic pancreatitis   Ok to trial PO.  Can adv as long as no n/v Cont non op treatment of intussusception mobilize    LOS: 2 days    Axel Filler 08/27/2023

## 2023-08-27 NOTE — Progress Notes (Signed)
Gastroenterology Inpatient Follow Up    Subjective: Patient is still experiencing fairly significant abdominal pain that is about the same as when he first came in.  He feels like this pain is different from his typical pancreatitis pain.    Objective: Vital signs in last 24 hours: Temp:  [97.9 F (36.6 C)-98.3 F (36.8 C)] 98 F (36.7 C) (10/19 1210) Pulse Rate:  [64-96] 87 (10/19 1300) Resp:  [7-24] 16 (10/19 1300) BP: (96-137)/(60-97) 125/89 (10/19 1300) SpO2:  [95 %-100 %] 99 % (10/19 1300)    Intake/Output from previous day: 10/18 0701 - 10/19 0700 In: 3380.7 [I.V.:3272.8; IV Piggyback:108] Out: 200 [Urine:200] Intake/Output this shift: No intake/output data recorded.  General appearance: alert and cooperative Resp: no increased WOB Cardio: regular rate GI: non-distended, tender in the right side of the abdomen Extremities: no BLE edema  Lab Results: Recent Labs    08/25/23 1858 08/25/23 2346 08/27/23 0639  WBC 5.2 5.3 3.9*  HGB 15.1 15.0 12.8*  HCT 44.1 45.0 37.5*  PLT 248 205 142*   BMET Recent Labs    08/26/23 2032 08/27/23 0041 08/27/23 0639  NA 135 135 138  K 3.9 3.7 3.2*  CL 100 99 101  CO2 28 26 27   GLUCOSE 181* 181* 89  BUN 14 17 15   CREATININE 0.76 0.74 0.64  CALCIUM 8.8* 9.0 9.2   LFT Recent Labs    08/25/23 1858  PROT 8.4*  ALBUMIN 4.6  AST 77*  ALT 133*  ALKPHOS 788*  BILITOT 1.1   PT/INR No results for input(s): "LABPROT", "INR" in the last 72 hours. Hepatitis Panel No results for input(s): "HEPBSAG", "HCVAB", "HEPAIGM", "HEPBIGM" in the last 72 hours. C-Diff No results for input(s): "CDIFFTOX" in the last 72 hours.  Studies/Results: DG Abd Portable 1V  Result Date: 08/27/2023 CLINICAL DATA:  37 year old male with history of intussusception. Persistent abdominal pain. EXAM: PORTABLE ABDOMEN - 1 VIEW COMPARISON:  Abdominal radiograph 04/29/2023. FINDINGS: Gas and stool are seen scattered throughout the colon  extending to the level of the distal rectum. No pathologic distension of small bowel is noted. No gross evidence of pneumoperitoneum. Biliary stent projecting over the right mid abdomen. IMPRESSION: 1. Nonobstructive bowel gas pattern. 2. No pneumoperitoneum. Electronically Signed   By: Trudie Reed M.D.   On: 08/27/2023 12:27   CT ABDOMEN PELVIS W CONTRAST  Result Date: 08/25/2023 CLINICAL DATA:  Right upper quadrant pain. History of portal vein thrombosis. Nausea, vomiting. EXAM: CT ABDOMEN AND PELVIS WITH CONTRAST TECHNIQUE: Multidetector CT imaging of the abdomen and pelvis was performed using the standard protocol following bolus administration of intravenous contrast. RADIATION DOSE REDUCTION: This exam was performed according to the departmental dose-optimization program which includes automated exposure control, adjustment of the mA and/or kV according to patient size and/or use of iterative reconstruction technique. CONTRAST:  OMNIPAQUE IOHEXOL 300 MG/ML  SOLN COMPARISON:  08/07/2023 FINDINGS: Lower chest: No acute abnormality Hepatobiliary: No focal liver abnormality. Diffuse low-density throughout the liver compatible with fatty infiltration. Small stones noted within the gallbladder fundus. No biliary ductal dilatation. Biliary stent noted in the common bile duct extending into the small bowel. Chronic portal vein thrombosis with cavernous transformation again noted, stable. Pancreas: Calcifications in the pancreas compatible with chronic pancreatitis. No surrounding inflammation to suggest acute pancreatitis. Spleen: No focal abnormality.  Normal size. Adrenals/Urinary Tract: No adrenal abnormality. No focal renal abnormality. No stones or hydronephrosis. Urinary bladder is unremarkable. Stomach/Bowel: Multiple small bowel intussusceptions noted, mostly  in the right abdomen with 1 noted in the left abdomen as well. There is no evidence of bowel obstruction. Small bowel wall in the  intussusception areas appears mildly edematous. Stomach and large bowel unremarkable. Vascular/Lymphatic: No evidence of aneurysm or adenopathy. Reproductive: No visible focal abnormality. Other: No free fluid or free air. Musculoskeletal: No acute bony abnormality. IMPRESSION: Multiple small bowel intussusceptions noted, mostly in the right abdomen. These are of un etiology. No evidence of bowel obstruction. Small bowel wall appears mildly edematous which could be the cause of the intussusceptions or resulting from the intussusceptions. Cholelithiasis. Chronic portal vein thrombosis with cavernous transformation. Hepatic steatosis. These results were called by telephone at the time of interpretation on 08/25/2023 at 11:29 pm to provider [facility] , who verbally acknowledged these results. Electronically Signed   By: Charlett Nose M.D.   On: 08/25/2023 23:32    Medications: I have reviewed the patient's current medications. Scheduled:  Chlorhexidine Gluconate Cloth  6 each Topical Daily   insulin aspart  0-5 Units Subcutaneous QHS   insulin aspart  0-9 Units Subcutaneous TID WC   insulin aspart  2 Units Subcutaneous TID WC   insulin glargine-yfgn  10 Units Subcutaneous QHS   Continuous:  lactated ringers 75 mL/hr at 08/27/23 1610   RUE:[id number], HYDROmorphone (DILAUDID) injection, ondansetron, mouth rinse  Assessment/Plan: 37 y.o. 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 CT abdomen pelvis showed multiple areas of small bowel intussusception, which is suspected to be the source of his pain. Intussusception may have developed as a result of  gastroenteritis. Surgery is currently following.  Lipase was normal and there were no signs of acute pancreatitis on imaging.  Patient states that this does not feel like his pancreatitis pain.  Patient does have some gallbladder stones on imaging, and Dr. [name] is recommending consideration of outpatient cholecystectomy in the future.  Hopefully with continued supportive care, patient will have improvement in his abdominal pain. -Continue supportive care -Patient is currently on clear liquid diet with plans to advance to full liquid diet later today -No ERCP recommended at this time -Consider outpatient cholecystectomy in the future   LOS: 2 days   Imogene Burn 08/27/2023, 1:44 PM

## 2023-08-27 NOTE — Plan of Care (Signed)
  Problem: Education: Goal: Ability to describe self-care measures that may prevent or decrease complications (Diabetes Survival Skills Education) will improve Outcome: Progressing Goal: Individualized Educational Video(s) Outcome: Progressing   Problem: Coping: Goal: Ability to adjust to condition or change in health will improve Outcome: Progressing   

## 2023-08-27 NOTE — Progress Notes (Addendum)
[facility] Progress Note Patient: Carlos [name] VEL:[id number] DOB: 08/06/1986 DOA: 08/25/2023  DOS: the patient was seen and examined on 08/27/2023  Brief hospital course: PMH of type I DM, chronic recurrent pancreatitis with chronic pain, history of alcohol abuse, anxiety presented to the hospital with complaints of abdominal pain and hyperglycemia. Found to have DKA as well as intussusception. GI and general surgery following.  DKA currently treated with IV insulin.  Assessment and Plan: Diabetic ketoacidosis. Type 1 diabetes mellitus uncontrolled with hyperglycemia with long-term insulin use With gastroparesis. Elevated beta-hydroxybutyrate calcium as well as anion gap of 29. Treated with IV fluid IV potassium as well as IV insulin therapy. Anion gap is closed although beta-hydroxybutyrate acid was elevated. Currently transitioning to basal bolus regimen again. Also given lower dose of insulin long-acting Lantus instead of his home regimen of 16 units for the same reason. Continue with IV hydration although switching from D5 LR to just LR. Continue sliding scale insulin every 4 hours.  Multiple small bowel intussusception. Primarily presented with abdominal pain. CT scan shows evidence of an Intussusception. GI was consulted as well as general surgery. Currently conservative management  Diet advancement per surgery.  X-ray reassuring. Will monitor closely.  Chronically elevated LFT. Monitor for now.  Chronic pancreatitis. At present under control.  Will monitor. Appreciate GI consultation.  Chronic pain syndrome. On Dilaudid 4 mg every 4 hours as needed. Continue Dilaudid 0.5 to 1 mg as needed.  Alcohol abuse. Denies any active drinking. Currently no withdrawals. Will monitor.  Smoker. Nicotine patch.  Underweight. Body mass index is 16.37 kg/m.  Placing the patient at a high risk of poor outcome.   Subjective: No nausea or vomiting.  Ongoing abdominal pain.   Passing gas.  Had a BM.  Physical Exam: General: in Mild distress, No Rash Cardiovascular: S1 and S2 Present, No Murmur Respiratory: Good respiratory effort, Bilateral Air entry present. No Crackles, No wheezes Abdomen: Bowel Sound present, diffuse tenderness Extremities: No edema Neuro: Alert and oriented x3, no new focal deficit  Data Reviewed: I have Reviewed nursing notes, Vitals, and Lab results. Since last encounter, pertinent lab results CBC and BMP   . I have ordered test including CBC and BMP  .   Disposition: Status is: Inpatient Remains inpatient appropriate because: Improvement in oral intake as well as pain  Place and maintain sequential compression device Start: 08/26/23 1126   Family Communication: No one at bedside Level of care: Med-Surg   Vitals:   08/27/23 1300 08/27/23 1351 08/27/23 1600 08/27/23 1747  BP: 125/89 132/85  131/87  Pulse: 87 83  71  Resp: 16 18  18   Temp:  97.6 F (36.4 C)  98 F (36.7 C)  TempSrc:  Oral  Oral  SpO2: 99% 98%  100%  Weight:      Height:   6\' 5"  (1.956 m)      Author: Lynden Oxford, MD 08/27/2023 7:26 PM  Please look on [email or web address] to find out who is on call.

## 2023-08-28 ENCOUNTER — Other Ambulatory Visit: Payer: Self-pay | Admitting: General Surgery

## 2023-08-28 DIAGNOSIS — E1010 Type 1 diabetes mellitus with ketoacidosis without coma: Secondary | ICD-10-CM | POA: Diagnosis not present

## 2023-08-28 LAB — BASIC METABOLIC PANEL
Anion gap: 8 (ref 5–15)
BUN: 8 mg/dL (ref 6–20)
CO2: 26 mmol/L (ref 22–32)
Calcium: 8.8 mg/dL — ABNORMAL LOW (ref 8.9–10.3)
Chloride: 103 mmol/L (ref 98–111)
Creatinine, Ser: 0.67 mg/dL (ref 0.61–1.24)
GFR, Estimated: >60 mL/min (ref >60)
Glucose, Bld: 213 mg/dL — ABNORMAL HIGH (ref 70–99)
Potassium: 4 mmol/L (ref 3.5–5.1)
Sodium: 137 mmol/L (ref 135–145)

## 2023-08-28 LAB — CBC
HCT: 35.7 % — ABNORMAL LOW (ref 39.0–52.0)
Hemoglobin: 11.9 g/dL — ABNORMAL LOW (ref 13.0–17.0)
MCH: 36.4 pg — ABNORMAL HIGH (ref 26.0–34.0)
MCHC: 33.3 g/dL (ref 30.0–36.0)
MCV: 109.2 fL — ABNORMAL HIGH (ref 80.0–100.0)
Platelets: 119 10*3/uL — ABNORMAL LOW (ref 150–400)
RBC: 3.27 MIL/uL — ABNORMAL LOW (ref 4.22–5.81)
RDW: 11.3 % — ABNORMAL LOW (ref 11.5–15.5)
WBC: 3 10*3/uL — ABNORMAL LOW (ref 4.0–10.5)
nRBC: 0 % (ref 0.0–0.2)

## 2023-08-28 LAB — GLUCOSE, CAPILLARY
Glucose-Capillary: 218 mg/dL — ABNORMAL HIGH (ref 70–99)
Glucose-Capillary: 291 mg/dL — ABNORMAL HIGH (ref 70–99)
Glucose-Capillary: 170 mg/dL — ABNORMAL HIGH (ref 70–99)
Glucose-Capillary: 366 mg/dL — ABNORMAL HIGH (ref 70–99)

## 2023-08-28 LAB — MAGNESIUM: Magnesium: 1.9 mg/dL (ref 1.7–2.4)

## 2023-08-28 MED ORDER — HYDROMORPHONE HCL 1 MG/ML IJ SOLN
1.0000 mg | INTRAMUSCULAR | Status: AC | PRN
  Administered 2023-08-28: 1 mg via INTRAVENOUS
  Filled 2023-08-28: qty 1

## 2023-08-28 MED ORDER — HYDROMORPHONE HCL 1 MG/ML IJ SOLN: 0.5000 mg | INTRAMUSCULAR | Status: DC | PRN

## 2023-08-28 MED ORDER — INSULIN ASPART 100 UNIT/ML IJ SOLN
5.0000 [IU] | Freq: Three times a day (TID) | INTRAMUSCULAR | Status: DC
  Administered 2023-08-28 – 2023-08-29 (×3): 5 [IU] via SUBCUTANEOUS

## 2023-08-28 MED ORDER — INSULIN GLARGINE-YFGN 100 UNIT/ML ~~LOC~~ SOLN
16.0000 [IU] | Freq: Every day | SUBCUTANEOUS | Status: DC
  Administered 2023-08-28: 16 [IU] via SUBCUTANEOUS
  Filled 2023-08-28 (×2): qty 0.16

## 2023-08-28 MED ORDER — HYDROMORPHONE HCL 2 MG PO TABS
4.0000 mg | ORAL_TABLET | ORAL | Status: DC | PRN
  Administered 2023-08-28 – 2023-08-29 (×3): 4 mg via ORAL
  Filled 2023-08-28 (×3): qty 2

## 2023-08-28 NOTE — Progress Notes (Signed)
   Subjective/Chief Complaint: Tol clears Less abd pain   Objective: Vital signs in last 24 hours: Temp:  [97.6 F (36.4 C)-98.4 F (36.9 C)] 98.4 F (36.9 C) (10/20 0210) Pulse Rate:  [62-87] 62 (10/20 0210) Resp:  [10-18] 18 (10/20 0210) BP: (121-132)/(77-97) 121/77 (10/20 0210) SpO2:  [95 %-100 %] 98 % (10/20 0210) Last BM Date : 08/27/23  Intake/Output from previous day: 10/19 0701 - 10/20 0700 In: 1641.9 [P.O.:240; I.V.:1401.9] Out: -  Intake/Output this shift: No intake/output data recorded.  PE:  Constitutional: No acute distress, conversant, appears states age. Eyes: Anicteric sclerae, moist conjunctiva, no lid lag Lungs: Clear to auscultation bilaterally, normal respiratory effort CV: regular rate and rhythm, no murmurs, no peripheral edema, pedal pulses 2+ GI: Soft, no masses or hepatosplenomegaly, non-tender to palpation Skin: No rashes, palpation reveals normal turgor Psychiatric: appropriate judgment and insight, oriented to person, place, and time   Lab Results:  Recent Labs    08/27/23 0639 08/28/23 0340  WBC 3.9* 3.0*  HGB 12.8* 11.9*  HCT 37.5* 35.7*  PLT 142* 119*   BMET Recent Labs    08/27/23 0639 08/28/23 0340  NA 138 137  K 3.2* 4.0  CL 101 103  CO2 27 26  GLUCOSE 89 213*  BUN 15 8  CREATININE 0.64 0.67  CALCIUM 9.2 8.8*   PT/INR No results for input(s): "LABPROT", "INR" in the last 72 hours. ABG Recent Labs    08/25/23 2248  HCO3 17.6*    Studies/Results: DG Abd Portable 1V  Result Date: 08/27/2023 CLINICAL DATA:  37 year old male with history of intussusception. Persistent abdominal pain. EXAM: PORTABLE ABDOMEN - 1 VIEW COMPARISON:  Abdominal radiograph 04/29/2023. FINDINGS: Gas and stool are seen scattered throughout the colon extending to the level of the distal rectum. No pathologic distension of small bowel is noted. No gross evidence of pneumoperitoneum. Biliary stent projecting over the right mid abdomen.  IMPRESSION: 1. Nonobstructive bowel gas pattern. 2. No pneumoperitoneum. Electronically Signed   By: Trudie Reed M.D.   On: 08/27/2023 12:27     Assessment/Plan: 37 year old male with small bowel intussusception on CT scan without obstruction  chronic pancreatitis  Ok to ADAT.  No plans for surgery at this point.  Please call back if needed mobilize   LOS: 3 days    Axel Filler 08/28/2023

## 2023-08-28 NOTE — Plan of Care (Signed)
  Problem: Fluid Volume: Goal: Ability to maintain a balanced intake and output will improve Outcome: Progressing   Problem: Metabolic: Goal: Ability to maintain appropriate glucose levels will improve Outcome: Progressing   Problem: Nutritional: Goal: Maintenance of adequate nutrition will improve Outcome: Progressing Goal: Progress toward achieving an optimal weight will improve Outcome: Progressing   Problem: Skin Integrity: Goal: Risk for impaired skin integrity will decrease Outcome: Progressing   Problem: Safety: Goal: Ability to remain free from injury will improve Outcome: Progressing   Problem: Skin Integrity: Goal: Risk for impaired skin integrity will decrease Outcome: Progressing   Problem: Pain Managment: Goal: General experience of comfort will improve Outcome: Not Progressing

## 2023-08-28 NOTE — Plan of Care (Signed)

## 2023-08-28 NOTE — Progress Notes (Signed)
Gastroenterology Inpatient Follow Up    Subjective: Patient states that his nausea is getting better. He has been able to tolerate a full liquid diet. Ab pain is still present but perhaps slightly better  Objective: Vital signs in last 24 hours: Temp:  [97.6 F (36.4 C)-98.4 F (36.9 C)] 98.4 F (36.9 C) (10/20 0210) Pulse Rate:  [62-87] 62 (10/20 0210) Resp:  [16-18] 18 (10/20 0210) BP: (121-132)/(77-90) 121/77 (10/20 0210) SpO2:  [98 %-100 %] 98 % (10/20 0210) Last BM Date : 08/27/23  Intake/Output from previous day: 10/19 0701 - 10/20 0700 In: 1641.9 [P.O.:240; I.V.:1401.9] Out: -  Intake/Output this shift: No intake/output data recorded.  General appearance: alert and cooperative Resp: no increased WOB Cardio: regular rate GI: non-distended, tender in the right side of the abdomen Extremities: no BLE edema  Lab Results: Recent Labs    08/25/23 2346 08/27/23 0639 08/28/23 0340  WBC 5.3 3.9* 3.0*  HGB 15.0 12.8* 11.9*  HCT 45.0 37.5* 35.7*  PLT 205 142* 119*   BMET Recent Labs    08/27/23 0041 08/27/23 0639 08/28/23 0340  NA 135 138 137  K 3.7 3.2* 4.0  CL 99 101 103  CO2 26 27 26   GLUCOSE 181* 89 213*  BUN 17 15 8   CREATININE 0.74 0.64 0.67  CALCIUM 9.0 9.2 8.8*   LFT Recent Labs    08/25/23 1858  PROT 8.4*  ALBUMIN 4.6  AST 77*  ALT 133*  ALKPHOS 788*  BILITOT 1.1   PT/INR No results for input(s): "LABPROT", "INR" in the last 72 hours. Hepatitis Panel No results for input(s): "HEPBSAG", "HCVAB", "HEPAIGM", "HEPBIGM" in the last 72 hours. C-Diff No results for input(s): "CDIFFTOX" in the last 72 hours.  Studies/Results: DG Abd Portable 1V  Result Date: 08/27/2023 CLINICAL DATA:  37 year old male with history of intussusception. Persistent abdominal pain. EXAM: PORTABLE ABDOMEN - 1 VIEW COMPARISON:  Abdominal radiograph 04/29/2023. FINDINGS: Gas and stool are seen scattered throughout the colon extending to the level of the distal  rectum. No pathologic distension of small bowel is noted. No gross evidence of pneumoperitoneum. Biliary stent projecting over the right mid abdomen. IMPRESSION: 1. Nonobstructive bowel gas pattern. 2. No pneumoperitoneum. Electronically Signed   By: Trudie Reed M.D.   On: 08/27/2023 12:27    Medications: I have reviewed the patient's current medications. Scheduled:  Chlorhexidine Gluconate Cloth  6 each Topical Daily   insulin aspart  0-5 Units Subcutaneous QHS   insulin aspart  0-9 Units Subcutaneous TID WC   insulin aspart  5 Units Subcutaneous TID WC   insulin glargine-yfgn  16 Units Subcutaneous QHS   Continuous:   NWG:[id number], HYDROmorphone (DILAUDID) injection, ondansetron, mouth rinse  Assessment/Plan: 37 y.o. 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  Found to be in DKA originally but this is now resolved.  CT abdomen pelvis showed multiple areas of small bowel intussusception, which is suspected to be the source of his pain. Intussusception may have developed as a result of gastroenteritis.  Acute pancreatitis seems less likely to be a source of his pain.  Patient's symptoms are slowly improving.  He was able to tolerate a full liquid diet so we will continue advance. -  Continue supportive care -Advance to soft diet.  If doing well by tomorrow, then can advance to low-fat diet. -No ERCP recommended at this time -Surgery following -Consider outpatient cholecystectomy in the future -We will arrange for outpatient GI follow-up, which has been requested -GI will sign off for now.  Please call back if any new questions arise.   LOS: 3  days   Imogene Burn 08/28/2023, 11:52 AM

## 2023-08-28 NOTE — Progress Notes (Signed)
[facility] Progress Note Patient: Authur Laughead WUJ:[id number] DOB: [birth year] DOA: 08/25/2023  DOS: the patient was seen and examined on 08/28/2023  Brief hospital course: PMH of type I DM, chronic recurrent pancreatitis with chronic pain, history of alcohol abuse, anxiety presented to the hospital with complaints of abdominal pain and hyperglycemia. Found to have DKA as well as intussusception. GI and general surgery following.  DKA currently treated with IV insulin.  Assessment and Plan: Diabetic ketoacidosis. Type 1 diabetes mellitus uncontrolled with hyperglycemia with long-term insulin use With gastroparesis. Elevated beta-hydroxybutyrate calcium as well as anion gap of 29. Treated with IV fluid IV potassium as well as IV insulin therapy. Anion gap is closed although beta-hydroxybutyrate acid was elevated. Currently transitioning to basal bolus regimen again. Will advance to carb modified diet tomorrow. Will be switching to sliding scale and home regimen of Lantus.  Multiple small bowel intussusception. Primarily presented with abdominal pain. CT scan shows evidence of an Intussusception. GI was consulted as well as general surgery. Currently conservative management  Diet advancement per surgery.  X-ray reassuring. Will monitor closely.  Chronically elevated LFT. Monitor for now.  Chronic pancreatitis. At present under control.  Will monitor. Appreciate GI consultation.  Chronic pain syndrome. On Dilaudid 4 mg every 4 hours as needed. Continue Dilaudid 0.5 to 1 mg as needed.  Alcohol abuse. Denies any active drinking. Currently no withdrawals. Will monitor.  Smoker. Nicotine patch.  Underweight. Body mass index is 16.37 kg/m.  Placing the patient at a high risk of poor outcome.   Subjective: Nausea improving.  Abdominal pain still present unchanged.  Had a BM.  Physical Exam: General: in Mild distress, No Rash Cardiovascular: S1 and S2 Present, No  Murmur Respiratory: Good respiratory effort, Bilateral Air entry present. No Crackles, No wheezes Abdomen: Bowel Sound present, No tenderness Extremities: No edema Neuro: Alert and oriented x3, no new focal deficit  Data Reviewed: I have Reviewed nursing notes, Vitals, and Lab results. Since last encounter, pertinent lab results CBC and BMP   . I have ordered test including CBC and BMP  .   Disposition: Status is: Inpatient Remains inpatient appropriate because: Monitor for improvement in oral intake.  Place and maintain sequential compression device Start: 08/26/23 1126   Family Communication: No one at bedside Level of care: Med-Surg   Vitals:   08/27/23 1747 08/27/23 2115 08/28/23 0210 08/28/23 1257  BP: 131/87 (!) 129/90 121/77 111/75  Pulse: 71 70 62 66  Resp: 18 17 18 20   Temp: 98 F (36.7 C) 97.9 F (36.6 C) 98.4 F (36.9 C) 98 F (36.7 C)  TempSrc: Oral Oral Oral Oral  SpO2: 100% 100% 98% 100%  Weight:      Height:         Author: Lynden Oxford, MD 08/28/2023 3:30 PM  Please look on [email or web address] to find out who is on call.

## 2023-08-28 NOTE — Plan of Care (Signed)
[facility] Tonsillectomy/Adenoidectomy, Postoperative PEDS care plan entered in error.

## 2023-08-29 ENCOUNTER — Inpatient Hospital Stay (HOSPITAL_COMMUNITY): Payer: Medicaid Other

## 2023-08-29 ENCOUNTER — Telehealth: Payer: Self-pay

## 2023-08-29 DIAGNOSIS — E1010 Type 1 diabetes mellitus with ketoacidosis without coma: Secondary | ICD-10-CM | POA: Diagnosis not present

## 2023-08-29 LAB — BASIC METABOLIC PANEL
Anion gap: 11 (ref 5–15)
BUN: 7 mg/dL (ref 6–20)
CO2: 24 mmol/L (ref 22–32)
Calcium: 9.4 mg/dL (ref 8.9–10.3)
Chloride: 98 mmol/L (ref 98–111)
Creatinine, Ser: 0.66 mg/dL (ref 0.61–1.24)
GFR, Estimated: >60 mL/min (ref >60)
Glucose, Bld: 184 mg/dL — ABNORMAL HIGH (ref 70–99)
Potassium: 3.7 mmol/L (ref 3.5–5.1)
Sodium: 133 mmol/L — ABNORMAL LOW (ref 135–145)

## 2023-08-29 LAB — TROPONIN I (HIGH SENSITIVITY): Troponin I (High Sensitivity): 4 ng/L (ref <18)

## 2023-08-29 LAB — CBC
HCT: 39.2 % (ref 39.0–52.0)
Hemoglobin: 13 g/dL (ref 13.0–17.0)
MCH: 36.4 pg — ABNORMAL HIGH (ref 26.0–34.0)
MCHC: 33.2 g/dL (ref 30.0–36.0)
MCV: 109.8 fL — ABNORMAL HIGH (ref 80.0–100.0)
Platelets: 119 10*3/uL — ABNORMAL LOW (ref 150–400)
RBC: 3.57 MIL/uL — ABNORMAL LOW (ref 4.22–5.81)
RDW: 11.4 % — ABNORMAL LOW (ref 11.5–15.5)
WBC: 5.4 10*3/uL (ref 4.0–10.5)
nRBC: 0 % (ref 0.0–0.2)

## 2023-08-29 LAB — MAGNESIUM: Magnesium: 1.9 mg/dL (ref 1.7–2.4)

## 2023-08-29 LAB — GLUCOSE, CAPILLARY
Glucose-Capillary: 167 mg/dL — ABNORMAL HIGH (ref 70–99)
Glucose-Capillary: 76 mg/dL (ref 70–99)

## 2023-08-29 LAB — D-DIMER, QUANTITATIVE: D-Dimer, Quant: 0.44 ug{FEU}/mL (ref 0.00–0.50)

## 2023-08-29 MED ORDER — POLYETHYLENE GLYCOL 3350 17 G PO PACK: 17.0000 g | PACK | Freq: Every day | ORAL | 0 refills | Status: DC

## 2023-08-29 NOTE — TOC Transition Note (Signed)
Transition of Care [facility]) - CM/SW Discharge Note   Patient Details  Name: Carlos [name] MRN: [id number] Date of Birth: 10-29-1986  Transition of Care [facility] CM/SW Contact:  Harriett Sine, RN Phone Number: 08/29/2023, 12:19 PM   Clinical Narrative:     Pt from home with significant other. Pt d/c home with self care. Pt has pcp. Spoke with pt at bedside about d/c plan and following up with pcp on DM management. Pt  arranged transportation home.   Final next level of care: Home/Self Care Barriers to Discharge: No Barriers Identified   Patient Goals and CMS Choice CMS Medicare.gov Compare Post Acute Care list provided to:: Patient Represenative (must comment) Choice offered to / list presented to : NA  Discharge Placement                    Name of family member notified: Pt notified family Patient and family notified of of transfer: 08/29/23  Discharge Plan and Services Additional resources added to the After Visit Summary for                  DME Arranged:  (NA)         HH Arranged:  (NA)          Social Determinants of Health (SDOH) Interventions SDOH Screenings   Food Insecurity: No Food Insecurity (08/26/2023)  Housing: Patient Declined (08/26/2023)  Transportation Needs: No Transportation Needs (08/26/2023)  Utilities: Not At Risk (08/26/2023)  Alcohol Screen: Low Risk  (04/05/2023)  Depression (PHQ2-9): Low Risk  (11/03/2022)  Financial Resource Strain: Low Risk  (04/05/2023)  Physical Activity: Sufficiently Active (04/05/2023)  Social Connections: Moderately Isolated (04/05/2023)  Stress: Stress Concern Present (04/05/2023)  Tobacco Use: High Risk (08/25/2023)     Readmission Risk Interventions    08/08/2023   10:12 AM  Readmission Risk Prevention Plan  Transportation Screening Complete  PCP or Specialist Appt within 5-7 Days Complete  Home Care Screening Complete  Medication Review (RN CM) Complete

## 2023-08-29 NOTE — Telephone Encounter (Signed)
Appt made per V.O from [facility]. On 11/10/23 at 2:30 pm with Dr [name]. This is his first available.  Pt to be advised at discharge. He is currently admitted.  I will also mail a letter with appt information.

## 2023-08-29 NOTE — Plan of Care (Signed)
  Problem: Fluid Volume: Goal: Ability to maintain a balanced intake and output will improve Outcome: Progressing   Problem: Pain Managment: Goal: General experience of comfort will improve Outcome: Progressing   Problem: Elimination: Goal: Will not experience complications related to bowel motility Outcome: Progressing   Problem: Safety: Goal: Ability to remain free from injury will improve Outcome: Progressing

## 2023-08-29 NOTE — Telephone Encounter (Signed)
-----   Message from Imogene Burn sent at 08/28/2023 11:54 AM EDT ----- Honor Loh, please arrange for 1-2 month follow up with Dr. [name] or APP for chronic pancreatitis.

## 2023-08-30 ENCOUNTER — Telehealth: Payer: Self-pay

## 2023-08-30 NOTE — Transitions of Care (Post Inpatient/ED Visit) (Signed)
08/30/2023  Name: Carlos [name] MRN: [id number] DOB: [birth year]  Today's TOC FU Call Status: Today's TOC FU Call Status:: Unsuccessful Call (1st Attempt) Unsuccessful Call (1st Attempt) Date: 08/30/23  Attempted to reach the patient regarding the most recent Inpatient/ED visit.  Follow Up Plan: Additional outreach attempts will be made to reach the patient to complete the Transitions of Care (Post Inpatient/ED visit) call.      Antionette Fairy, RN,BSN,CCM RN Care Manager Transitions of Care  [place]  Direct Phone: [phone] Toll Free: [phone] Fax: [phone]

## 2023-08-31 ENCOUNTER — Telehealth: Payer: Self-pay

## 2023-08-31 NOTE — Transitions of Care (Post Inpatient/ED Visit) (Signed)
08/31/2023  Name: Carlos [name] MRN: [id number] DOB: [birth year]  Today's TOC FU Call Status: Today's TOC FU Call Status:: Unsuccessful Call (2nd Attempt) Unsuccessful Call (2nd Attempt) Date: 08/31/23  Attempted to reach the patient regarding the most recent Inpatient/ED visit.  Follow Up Plan: Additional outreach attempts will be made to reach the patient to complete the Transitions of Care (Post Inpatient/ED visit) call.      Antionette Fairy, RN,BSN,CCM RN Care Manager Transitions of Care  [place]  Direct Phone: [phone] Toll Free: [phone] Fax: [phone]

## 2023-09-01 ENCOUNTER — Telehealth: Payer: Self-pay

## 2023-09-01 NOTE — Transitions of Care (Post Inpatient/ED Visit) (Signed)
09/01/2023  Name: Carlos [name] MRN: [id number] DOB: 08-19-1986  Today's TOC FU Call Status: Today's TOC FU Call Status:: Unsuccessful Call (3rd Attempt) Unsuccessful Call (3rd Attempt) Date: 09/01/23  Attempted to reach the patient regarding the most recent Inpatient/ED visit.  Follow Up Plan: No further outreach attempts will be made at this time. We have been unable to contact the patient.    Antionette Fairy, RN,BSN,CCM RN Care Manager Transitions of Care  [place]  Direct Phone: [phone] Toll Free: [phone] Fax: [phone]

## 2023-09-01 NOTE — Discharge Summary (Signed)
Physician Discharge Summary   Patient: Carlos [name] MRN: [id number] DOB: November 30, 1985  Admit date:     08/25/2023  Discharge date: 08/29/2023  Discharge Physician: Lynden Oxford  PCP: Caesar Bookman, NP  Recommendations at discharge: Follow-up with PCP in 1 week.   Follow-up Information     [name], Dinah C, NP. Schedule an appointment as soon as possible for a visit in 1 week(s).   Specialty: Family Medicine Contact information: [address] [place] [address] [phone]                Discharge Diagnoses: Principal Problem:   DKA (diabetic ketoacidosis) (HCC) Active Problems:   Uncontrolled type 1 diabetes mellitus with hyperglycemia, with long-term current use of insulin (HCC)   History of alcohol abuse   Tobacco dependence   Transaminitis   Gastroesophageal reflux disease with esophagitis without hemorrhage   Intussusception intestine (HCC)   History of biliary duct stent placement   Generalized abdominal pain   Intussusception (HCC)  Brief hospital course: PMH of type I DM, chronic recurrent pancreatitis with chronic pain, history of alcohol abuse, anxiety presented to the hospital with complaints of abdominal pain and hyperglycemia. Found to have DKA as well as intussusception. GI and general surgery following.  DKA currently treated with IV insulin.  Assessment and Plan: Diabetic ketoacidosis. Type 1 diabetes mellitus uncontrolled with hyperglycemia with long-term insulin use With gastroparesis. Elevated beta-hydroxybutyrate calcium as well as anion gap of 29. Treated with IV fluid IV potassium as well as IV insulin therapy. Anion gap is closed although beta-hydroxybutyrate acid was elevated. Tolerating oral diet.  Continue home regimen.  Multiple small bowel intussusception. Primarily presented with abdominal pain. CT scan shows evidence of an Intussusception. GI was consulted as well as general surgery. Currently conservative management  Diet  advancement per surgery.  X-ray reassuring. Will monitor closely.  Chronically elevated LFT. Monitor for now.  Chronic pancreatitis. At present under control.  Will monitor. Appreciate GI consultation.  Chronic pain syndrome. On Dilaudid 4 mg every 4 hours as needed. Continue Dilaudid 0.5 to 1 mg as needed.  Alcohol abuse. Denies any active drinking. Currently no withdrawals. Will monitor.  Smoker. Nicotine patch.  Underweight. Body mass index is 16.37 kg/m.  Placing the patient at a high risk of poor outcome.  Reported chest pain. EKG troponin chest x-ray unremarkable. Likely musculoskeletal.  Pain control - [employer] Controlled Substance Reporting System database was reviewed. and patient was instructed, not to drive, operate heavy machinery, perform activities at heights, swimming or participation in water activities or provide baby-sitting services while on Pain, Sleep and Anxiety Medications; until their outpatient Physician has advised to do so again. Also recommended to not to take more than prescribed Pain, Sleep and Anxiety Medications.  Consultants:  GI General surgery  Procedures performed:  None  DISCHARGE MEDICATION: Allergies as of 08/29/2023       Reactions   Other Anaphylaxis, Other (See Comments)   [name] - anaphylaxis        Medication List     STOP taking these medications    metoCLOPramide 5 MG tablet Commonly known as: Reglan       TAKE these medications    dicyclomine 10 MG capsule Commonly known as: BENTYL Take 1 capsule (10 mg total) by mouth 3 (three) times daily as needed for spasms (stomach cramps.).   gabapentin 100 MG capsule Commonly known as: Neurontin Take 1 capsule (100 mg total) by mouth 3 (three) times daily.  HYDROmorphone 4 MG tablet Commonly known as: DILAUDID Take 1 tablet (4 mg total) by mouth every 4 (four) hours as needed for pain.  Max daily dose 6 tablets. What changed: when to take this    insulin aspart 100 UNIT/ML injection Commonly known as: novoLOG 60 units a day What changed:  how much to take how to take this when to take this additional instructions   insulin glargine 100 UNIT/ML injection Commonly known as: LANTUS Inject 16 Units into the skin in the morning.   lipase/protease/amylase 16109 UNITS Cpep capsule Commonly known as: Creon Take 1-2 capsules (36,000-72,000 Units total) by mouth See admin instructions. Take 72,000 units by mouth three times a day before meals and 36,000 units with any snacks   multivitamin Tabs tablet Take 1 tablet by mouth daily with breakfast.   naloxone 4 MG/0.1ML Liqd nasal spray kit Commonly known as: NARCAN Place 1 spray into the nose once as needed (Overdose).   omeprazole 40 MG capsule Commonly known as: PRILOSEC TAKE 1 CAPSULE (40 MG TOTAL) BY MOUTH DAILY.   polyethylene glycol 17 g packet Commonly known as: MiraLax Take 17 g by mouth daily.       Disposition: Home Diet recommendation: Cardiac diet  Discharge Exam: Vitals:   08/28/23 0210 08/28/23 1257 08/28/23 2057 08/29/23 0515  BP: 121/77 111/75 119/78 135/80  Pulse: 62 66 79 100  Resp: 18 20 18 16   Temp: 98.4 F (36.9 C) 98 F (36.7 C) 97.9 F (36.6 C) (!) 100.6 F (38.1 C)  TempSrc: Oral Oral Oral Oral  SpO2: 98% 100% 100% 100%  Weight:      Height:       General: Appear in mild distress; no visible Abnormal Neck Mass Or lumps, Conjunctiva normal Cardiovascular: S1 and S2 Present, no Murmur, Respiratory: good respiratory effort, Bilateral Air entry present and CTA, no Crackles, no wheezes Abdomen: Bowel Sound present, mild diffuse tenderness chronic Extremities: no Pedal edema Neurology: alert and oriented to time, place, and person  Filed Weights   08/25/23 2300  Weight: 62.6 kg   Condition at discharge: stable  The results of significant diagnostics from this hospitalization (including imaging, microbiology, ancillary and laboratory)  are listed below for reference.   Imaging Studies: DG CHEST PORT 1 VIEW  Result Date: 08/29/2023 CLINICAL DATA:  Chest and epigastric pain, intussusception evaluation EXAM: PORTABLE CHEST 1 VIEW COMPARISON:  08/27/2023 FINDINGS: The heart size and mediastinal contours are within normal limits. Both lungs are clear. The visualized skeletal structures are unremarkable. Nonobstructive pattern of bowel gas with gas and stool present to the rectum. No free air in the abdomen. Common bile duct stent projects in expected position within the right hemiabdomen. IMPRESSION: 1. No acute abnormality of the lungs. 2. Nonobstructive pattern of bowel gas with gas and stool present to the rectum. No free air in the abdomen. 3. Common bile duct stent projects in expected position within the right hemiabdomen. Electronically Signed   By: Jearld Lesch M.D.   On: 08/29/2023 12:58   DG Abd Portable 2V  Result Date: 08/29/2023 CLINICAL DATA:  Chest and epigastric pain, intussusception evaluation EXAM: PORTABLE CHEST 1 VIEW COMPARISON:  08/27/2023 FINDINGS: The heart size and mediastinal contours are within normal limits. Both lungs are clear. The visualized skeletal structures are unremarkable. Nonobstructive pattern of bowel gas with gas and stool present to the rectum. No free air in the abdomen. Common bile duct stent projects in expected position within the right hemiabdomen. IMPRESSION:  1. No acute abnormality of the lungs. 2. Nonobstructive pattern of bowel gas with gas and stool present to the rectum. No free air in the abdomen. 3. Common bile duct stent projects in expected position within the right hemiabdomen. Electronically Signed   By: Jearld Lesch M.D.   On: 08/29/2023 12:58   DG Abd Portable 1V  Result Date: 08/27/2023 CLINICAL DATA:  37 year old male with history of intussusception. Persistent abdominal pain. EXAM: PORTABLE ABDOMEN - 1 VIEW COMPARISON:  Abdominal radiograph 04/29/2023. FINDINGS: Gas and  stool are seen scattered throughout the colon extending to the level of the distal rectum. No pathologic distension of small bowel is noted. No gross evidence of pneumoperitoneum. Biliary stent projecting over the right mid abdomen. IMPRESSION: 1. Nonobstructive bowel gas pattern. 2. No pneumoperitoneum. Electronically Signed   By: Trudie Reed M.D.   On: 08/27/2023 12:27   CT ABDOMEN PELVIS W CONTRAST  Result Date: 08/25/2023 CLINICAL DATA:  Right upper quadrant pain. History of portal vein thrombosis. Nausea, vomiting. EXAM: CT ABDOMEN AND PELVIS WITH CONTRAST TECHNIQUE: Multidetector CT imaging of the abdomen and pelvis was performed using the standard protocol following bolus administration of intravenous contrast. RADIATION DOSE REDUCTION: This exam was performed according to the departmental dose-optimization program which includes automated exposure control, adjustment of the mA and/or kV according to patient size and/or use of iterative reconstruction technique. CONTRAST:  OMNIPAQUE IOHEXOL 300 MG/ML  SOLN COMPARISON:  08/07/2023 FINDINGS: Lower chest: No acute abnormality Hepatobiliary: No focal liver abnormality. Diffuse low-density throughout the liver compatible with fatty infiltration. Small stones noted within the gallbladder fundus. No biliary ductal dilatation. Biliary stent noted in the common bile duct extending into the small bowel. Chronic portal vein thrombosis with cavernous transformation again noted, stable. Pancreas: Calcifications in the pancreas compatible with chronic pancreatitis. No surrounding inflammation to suggest acute pancreatitis. Spleen: No focal abnormality.  Normal size. Adrenals/Urinary Tract: No adrenal abnormality. No focal renal abnormality. No stones or hydronephrosis. Urinary bladder is unremarkable. Stomach/Bowel: Multiple small bowel intussusceptions noted, mostly in the right abdomen with 1 noted in the left abdomen as well. There is no evidence of  bowel obstruction. Small bowel wall in the intussusception areas appears mildly edematous. Stomach and large bowel unremarkable. Vascular/Lymphatic: No evidence of aneurysm or adenopathy. Reproductive: No visible focal abnormality. Other: No free fluid or free air. Musculoskeletal: No acute bony abnormality. IMPRESSION: Multiple small bowel intussusceptions noted, mostly in the right abdomen. These are of un etiology. No evidence of bowel obstruction. Small bowel wall appears mildly edematous which could be the cause of the intussusceptions or resulting from the intussusceptions. Cholelithiasis. Chronic portal vein thrombosis with cavernous transformation. Hepatic steatosis. These results were called by telephone at the time of interpretation on 08/25/2023 at 11:29 pm to provider [facility] , who verbally acknowledged these results. Electronically Signed   By: Charlett Nose M.D.   On: 08/25/2023 23:32   CT ABDOMEN PELVIS W CONTRAST  Result Date: 08/07/2023 CLINICAL DATA:  Abdominal pain.  History of pancreatitis. EXAM: CT ABDOMEN AND PELVIS WITH CONTRAST TECHNIQUE: Multidetector CT imaging of the abdomen and pelvis was performed using the standard protocol following bolus administration of intravenous contrast. RADIATION DOSE REDUCTION: This exam was performed according to the departmental dose-optimization program which includes automated exposure control, adjustment of the mA and/or kV according to patient size and/or use of iterative reconstruction technique. CONTRAST:  OMNIPAQUE IOHEXOL 300 MG/ML  SOLN COMPARISON:  07/07/2023 FINDINGS: Lower chest: No acute abnormality.  Hepatobiliary: No focal liver abnormality. Moderate gallbladder distension. Layering stones versus sludge noted within the gallbladder fundus. No pericholecystic fluid or surrounding inflammatory change. There is a common bile duct stent in place. No intrahepatic bile duct dilatation. Pancreas: Scattered calcifications and  heterogeneous enhancement of the pancreas noted. Mild pancreatic edema with surrounding soft tissue stranding. No pseudocyst identified. No main duct dilatation or suspicious mass identified. Previous pancreatic duct stent has been removed. Spleen: Normal in size without focal abnormality. Adrenals/Urinary Tract: Adrenal glands are unremarkable. Kidneys are normal, without renal calculi, focal lesion, or hydronephrosis. Bladder is unremarkable. Stomach/Bowel: Stomach appears normal. The appendix is visualized and is within normal limits. No pathologic dilatation of the large or small bowel loops. No significant bowel wall thickening or inflammatory change. Vascular/Lymphatic: Mild irregularity of the extrahepatic portal vein caliber with scattered focal areas of mild narrowing up to the confluence without signs of thrombosis. 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 Normal appearance of the abdominal aorta. No signs of abdominopelvic adenopathy. Reproductive: Prostate is unremarkable. Other: No free fluid or fluid collections. Musculoskeletal: No acute or significant osseous findings. IMPRESSION: 1. Acute on chronic pancreatitis. No pseudocyst identified. 2. Common bile duct stent in place. No intrahepatic bile duct dilatation. 3. Moderate gallbladder distension. Layering stones versus sludge noted within the gallbladder fundus. No pericholecystic fluid or surrounding inflammatory change. 4. Mild irregularity of the extrahepatic portal vein caliber with scattered focal areas of mild narrowing up to the confluence without signs of thrombosis. 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 Electronically Signed   By: Signa Kell M.D.   On: 08/07/2023 07:13    Microbiology: Results for orders placed or performed during the hospital encounter of 08/25/23  MRSA Next Gen by PCR, Nasal     Status: None   Collection Time: 08/26/23  8:34 AM   Specimen: Nasal Mucosa; Nasal Swab  Result Value Ref  Range Status   MRSA by PCR Next Gen NOT DETECTED NOT DETECTED Final    Comment: (NOTE) The GeneXpert MRSA Assay (FDA approved for NASAL specimens only), is one component of a comprehensive MRSA colonization surveillance program. It is not intended to diagnose MRSA infection nor to guide or monitor treatment for MRSA infections. Test performance is not FDA approved in patients less than 4 years old. Performed at [facility], [address], [place], [place] [address]    Labs: CBC: Recent Labs  Lab 08/25/23 1858 08/25/23 2346 08/27/23 0639 08/28/23 0340 08/29/23 0428  WBC 5.2 5.3 3.9* 3.0* 5.4  NEUTROABS  --  3.5  --   --   --   HGB 15.1 15.0 12.8* 11.9* 13.0  HCT 44.1 45.0 37.5* 35.7* 39.2  MCV 104.3* 108.4* 106.2* 109.2* 109.8*  PLT 248 205 142* 119* 119*   Basic Metabolic Panel: Recent Labs  Lab 08/26/23 2032 08/27/23 0041 08/27/23 0639 08/28/23 0340 08/29/23 0428  NA 135 135 138 137 133*  K 3.9 3.7 3.2* 4.0 3.7  CL 100 99 101 103 98  CO2 28 26 27 26 24   GLUCOSE 181* 181* 89 213* 184*  BUN 14 17 15 8 7   CREATININE 0.76 0.74 0.64 0.67 0.66  CALCIUM 8.8* 9.0 9.2 8.8* 9.4  MG  --   --   --  1.9 1.9   Liver Function Tests: Recent Labs  Lab 08/25/23 1858  AST 77*  ALT 133*  ALKPHOS 788*  BILITOT 1.1  PROT 8.4*  ALBUMIN 4.6   CBG: Recent Labs  Lab 08/28/23 1133 08/28/23 1622 08/28/23 2059 08/29/23 0732 08/29/23 1157  GLUCAP 291* 170* 366* 167* 76    Discharge time spent: greater than 30 minutes.  Author: Lynden Oxford, MD  [facility] 08/29/2023

## 2023-09-08 ENCOUNTER — Other Ambulatory Visit (HOSPITAL_BASED_OUTPATIENT_CLINIC_OR_DEPARTMENT_OTHER): Payer: Self-pay

## 2023-09-08 MED ORDER — HYDROMORPHONE HCL 4 MG PO TABS
4.0000 mg | ORAL_TABLET | ORAL | 0 refills | Status: DC | PRN
  Filled 2023-09-08: qty 180, 30d supply, fill #0

## 2023-09-14 DIAGNOSIS — E1065 Type 1 diabetes mellitus with hyperglycemia: Secondary | ICD-10-CM

## 2023-09-19 ENCOUNTER — Encounter: Payer: Self-pay | Admitting: Internal Medicine

## 2023-09-19 ENCOUNTER — Ambulatory Visit: Payer: Medicaid Other | Admitting: Internal Medicine

## 2023-09-19 VITALS — BP 120/72 | HR 78 | Ht 77.0 in | Wt 163.0 lb

## 2023-09-19 DIAGNOSIS — E1042 Type 1 diabetes mellitus with diabetic polyneuropathy: Secondary | ICD-10-CM

## 2023-09-19 DIAGNOSIS — E1065 Type 1 diabetes mellitus with hyperglycemia: Secondary | ICD-10-CM | POA: Diagnosis not present

## 2023-09-19 LAB — POCT GLUCOSE (DEVICE FOR HOME USE): POC Glucose: 257 mg/dL — AB (ref 70–99)

## 2023-09-19 LAB — POCT GLYCOSYLATED HEMOGLOBIN (HGB A1C): Hemoglobin A1C: 7.9 % — AB (ref 4.0–5.6)

## 2023-09-19 MED ORDER — INSULIN PEN NEEDLE 32G X 4 MM MISC: 1.0000 | Freq: Four times a day (QID) | 3 refills | Status: AC

## 2023-09-19 MED ORDER — LANTUS SOLOSTAR 100 UNIT/ML ~~LOC~~ SOPN: 16.0000 [IU] | PEN_INJECTOR | Freq: Every day | SUBCUTANEOUS | 4 refills | Status: DC

## 2023-09-19 MED ORDER — DEXCOM G7 SENSOR MISC: 1.0000 | 3 refills | Status: DC

## 2023-09-19 MED ORDER — NOVOLOG FLEXPEN 100 UNIT/ML ~~LOC~~ SOPN: PEN_INJECTOR | SUBCUTANEOUS | 3 refills | Status: AC

## 2023-09-19 NOTE — Patient Instructions (Signed)
-  Take  Lantus 16 units once daily - Increase Novolog to 6 units with each meal PLUS additional if needed as below  -Novolog correctional insulin: ADD extra units on insulin to your meal-time Novolog dose if your blood sugars are higher than 180. Use the scale below to help guide you:   Blood sugar before meal Number of units to inject  Less than 180 0 unit  181 -  230 1 units  231 -  280 2 units  281 -  330 3 units  331 -  380 4 units  381 -  430 5 units  431 -  480 6 units  481 -  530 7 units   HOW TO TREAT LOW BLOOD SUGARS (Blood sugar LESS THAN 70 MG/DL) Please follow the RULE OF 15 for the treatment of hypoglycemia treatment (when your (blood sugars are less than 70 mg/dL)   STEP 1: Take 15 grams of carbohydrates when your blood sugar is low, which includes:  3-4 GLUCOSE TABS  OR 3-4 OZ OF JUICE OR REGULAR SODA OR ONE TUBE OF GLUCOSE GEL    STEP 2: RECHECK blood sugar in 15 MINUTES STEP 3: If your blood sugar is still low at the 15 minute recheck --> then, go back to STEP 1 and treat AGAIN with another 15 grams of carbohydrates.

## 2023-09-19 NOTE — Progress Notes (Signed)
Name: Carlos [name]  MRN/ DOB: [id number], 1986/10/24   Age/ Sex: 37 y.o., male    PCP: [name], Donalee Citrin, NP   Reason for Endocrinology Evaluation: Type 1 Diabetes Mellitus     Date of Initial Endocrinology Visit: 03/10/2022    PATIENT IDENTIFIER: Carlos [name] is a 37 y.o. male with a past medical history of T1DM, Hx of chronic pancreatitis due to ETOH abuse. The patient presented for initial endocrinology clinic visit on 03/10/2022 for consultative assistance with his diabetes management.    HPI: Carlos [name] was    Diagnosed with DM 09/2021 Hemoglobin A1c has ranged from 9.2% in 2023, peaking at 10.1% in 2022.   He is a Estate agent  at [employer] , works 3rd shift   On his initial visit to our clinic he had an A1c of 6.9%, we adjusted MDI regimen and provided him with a correction factor to be used before each meal   He was trained on Dexcom/OmniPod 05/2022, but was discontinued by 2024 due to cell phone incompatibility, and did not use PDM  SUBJECTIVE:   During the last visit (11/17/2022): A1c 8.4%  Today (09/19/23): Carlos [name] is here for a follow up on diabetes management. He  checks his blood sugars multiple  times daily. The patient has not had hypoglycemic episodes since the last clinic visit.  Since his last visit here he has been admitted multiple times for pancreatitis and DKA's   Denies vomiting but has nausea  Denies constipation or diarrhea    HOME DIABETES REGIMEN: Lantus 16 units daily  Novolog 5 units TIDQAC CF: Novolog (BG-130/50)   Statin: no ACE-I/ARB: no Prior Diabetic Education: yes    CONTINUOUS GLUCOSE MONITORING RECORD INTERPRETATION: N/A       DIABETIC COMPLICATIONS: Microvascular complications:   Denies: CKD, neuropathy, retinopathy Last eye exam: Completed 05/2022  Macrovascular complications:   Denies: CAD, PVD, CVA   PAST HISTORY: Past Medical History:  Past Medical History:  Diagnosis Date   AKI (acute kidney injury) (HCC)  12/21/2021   Anxiety    Diabetes (HCC)    Pancreatitis    Splenic vein thrombosis    Past Surgical History:  Past Surgical History:  Procedure Laterality Date   BILIARY BRUSHING  02/02/2023   Procedure: BILIARY BRUSHING;  Surgeon: Lemar Lofty., MD;  Location: [facility];  Service: Gastroenterology;;   BILIARY BRUSHING  04/28/2023   Procedure: BILIARY BRUSHING;  Surgeon: Meryl Dare, MD;  Location: [facility] ENDOSCOPY;  Service: Gastroenterology;;   BILIARY BRUSHING  06/02/2023   Procedure: BILIARY BRUSHING;  Surgeon: Lemar Lofty., MD;  Location: [facility];  Service: Gastroenterology;;   BILIARY DILATION  12/01/2022   Procedure: BILIARY DILATION;  Surgeon: Lemar Lofty., MD;  Location: [facility];  Service: Gastroenterology;;   BILIARY DILATION  02/02/2023   Procedure: BILIARY DILATION;  Surgeon: Lemar Lofty., MD;  Location: [facility];  Service: Gastroenterology;;   BILIARY STENT PLACEMENT  04/28/2023   Procedure: BILIARY STENT PLACEMENT;  Surgeon: Meryl Dare, MD;  Location: [facility];  Service: Gastroenterology;;   BILIARY STENT PLACEMENT N/A 06/02/2023   Procedure: BILIARY STENT PLACEMENT;  Surgeon: Lemar Lofty., MD;  Location: [facility] ENDOSCOPY;  Service: Gastroenterology;  Laterality: N/A;   BIOPSY  12/02/2021   Procedure: BIOPSY;  Surgeon: [name] [name], MD;  Location: [facility];  Service: Gastroenterology;;   ENDOSCOPIC RETROGRADE CHOLANGIOPANCREATOGRAPHY (ERCP) WITH PROPOFOL N/A 08/12/2022   Procedure: ENDOSCOPIC RETROGRADE CHOLANGIOPANCREATOGRAPHY (ERCP) WITH PROPOFOL;  Surgeon: [name],  [name], MD;  Location: [facility];  Service: Gastroenterology;  Laterality: N/A;   ENDOSCOPIC RETROGRADE CHOLANGIOPANCREATOGRAPHY (ERCP) WITH PROPOFOL N/A 12/01/2022   Procedure: ENDOSCOPIC RETROGRADE CHOLANGIOPANCREATOGRAPHY (ERCP) WITH PROPOFOL;  Surgeon: [name] [name], MD;  Location: [facility];   Service: Gastroenterology;  Laterality: N/A;   ENDOSCOPIC RETROGRADE CHOLANGIOPANCREATOGRAPHY (ERCP) WITH PROPOFOL N/A 02/02/2023   Procedure: ENDOSCOPIC RETROGRADE CHOLANGIOPANCREATOGRAPHY (ERCP) WITH PROPOFOL;  Surgeon: [name] [name], MD;  Location: [facility];  Service: Gastroenterology;  Laterality: N/A;   ENDOSCOPIC RETROGRADE CHOLANGIOPANCREATOGRAPHY (ERCP) WITH PROPOFOL N/A 06/02/2023   Procedure: ENDOSCOPIC RETROGRADE CHOLANGIOPANCREATOGRAPHY (ERCP) WITH PROPOFOL;  Surgeon: [name] [name], MD;  Location: [facility];  Service: Gastroenterology;  Laterality: N/A;   ERCP N/A 04/28/2023   Procedure: ENDOSCOPIC RETROGRADE CHOLANGIOPANCREATOGRAPHY (ERCP);  Surgeon: Meryl Dare, MD;  Location: [facility]COPY;  Service: Gastroenterology;  Laterality: N/A;   ESOPHAGOGASTRODUODENOSCOPY N/A 12/01/2022   Procedure: ESOPHAGOGASTRODUODENOSCOPY (EGD);  Surgeon: Lemar Lofty., MD;  Location: [facility];  Service: Gastroenterology;  Laterality: N/A;   ESOPHAGOGASTRODUODENOSCOPY (EGD) WITH PROPOFOL N/A 12/02/2021   Procedure: ESOPHAGOGASTRODUODENOSCOPY (EGD) WITH PROPOFOL;  Surgeon: [name] [name], MD;  Location: [facility];  Service: Gastroenterology;  Laterality: N/A;   ESOPHAGOGASTRODUODENOSCOPY (EGD) WITH PROPOFOL N/A 07/09/2023   Procedure: ESOPHAGOGASTRODUODENOSCOPY (EGD) WITH PROPOFOL;  Surgeon: Hilarie Fredrickson, MD;  Location: [facility];  Service: Gastroenterology;  Laterality: N/A;  needs ercp scope for stent pull   FINE NEEDLE ASPIRATION N/A 12/01/2022   Procedure: FINE NEEDLE ASPIRATION (FNA) LINEAR;  Surgeon: Lemar Lofty., MD;  Location: [facility];  Service: Gastroenterology;  Laterality: N/A;   NEUROLYTIC CELIAC PLEXUS  12/01/2022   Procedure: CELIAC PLEXUS BLOCK;  Surgeon: [name] [name], MD;  Location: [facility];  Service: Gastroenterology;;   PANCREATIC STENT PLACEMENT  08/12/2022   Procedure: PANCREATIC STENT  PLACEMENT;  Surgeon: Lemar Lofty., MD;  Location: [facility];  Service: Gastroenterology;;   PANCREATIC STENT PLACEMENT  12/01/2022   Procedure: PANCREATIC STENT PLACEMENT;  Surgeon: Lemar Lofty., MD;  Location: [facility];  Service: Gastroenterology;;   PANCREATIC STENT PLACEMENT  02/02/2023   Procedure: PANCREATIC STENT PLACEMENT;  Surgeon: Lemar Lofty., MD;  Location: [facility];  Service: Gastroenterology;;   PANCREATIC STENT PLACEMENT  06/02/2023   Procedure: PANCREATIC STENT PLACEMENT;  Surgeon: Lemar Lofty., MD;  Location: [facility];  Service: Gastroenterology;;   REMOVAL OF STONES  12/01/2022   Procedure: REMOVAL OF STONES;  Surgeon: Lemar Lofty., MD;  Location: [facility];  Service: Gastroenterology;;   REMOVAL OF STONES  02/02/2023   Procedure: REMOVAL OF STONES;  Surgeon: Lemar Lofty., MD;  Location: [facility];  Service: Gastroenterology;;   REMOVAL OF STONES  06/02/2023   Procedure: REMOVAL OF STONES;  Surgeon: Lemar Lofty., MD;  Location: [facility];  Service: Gastroenterology;;   [name]  08/12/2022   Procedure: [name];  Surgeon: Lemar Lofty., MD;  Location: [facility];  Service: Gastroenterology;;   [name]  12/01/2022   Procedure: [name];  Surgeon: Lemar Lofty., MD;  Location: [facility];  Service: Gastroenterology;;   [name]  06/02/2023   Procedure: [name];  Surgeon: Lemar Lofty., MD;  Location: [facility];  Service: Gastroenterology;;   Francine Graven REMOVAL  12/01/2022   Procedure: STENT REMOVAL;  Surgeon: Lemar Lofty., MD;  Location: [facility];  Service: Gastroenterology;;   Francine Graven REMOVAL  02/02/2023   Procedure: STENT REMOVAL;  Surgeon: Lemar Lofty., MD;  Location: [facility];  Service: Gastroenterology;;   Francine Graven REMOVAL  06/02/2023   Procedure: STENT REMOVAL;  Surgeon:  Lemar Lofty., MD;  Location: [facility];  Service: Gastroenterology;;   Francine Graven REMOVAL  07/09/2023   Procedure: STENT REMOVAL;  Surgeon: Hilarie Fredrickson, MD;  Location: [facility];  Service: Gastroenterology;;   UPPER ESOPHAGEAL ENDOSCOPIC ULTRASOUND (EUS) N/A 12/02/2021   Procedure: UPPER ESOPHAGEAL ENDOSCOPIC ULTRASOUND (EUS);  Surgeon: Lemar Lofty., MD;  Location: [facility];  Service: Gastroenterology;  Laterality: N/A;   UPPER ESOPHAGEAL ENDOSCOPIC ULTRASOUND (EUS) N/A 12/01/2022   Procedure: UPPER ESOPHAGEAL ENDOSCOPIC ULTRASOUND (EUS);  Surgeon: Lemar Lofty., MD;  Location: [facility];  Service: Gastroenterology;  Laterality: N/A;    Social History:  reports that he has been smoking cigars. He started smoking about 20 years ago. He has never used smokeless tobacco. He reports that he does not currently use alcohol. He reports that he does not use drugs. Family History:  Family History  Problem Relation Age of Onset   Hypertension Mother    Alcoholism Maternal Uncle    Diabetes Maternal Grandmother    Diabetes Maternal Grandfather    Diabetes Paternal Grandmother    Diabetes Paternal Grandfather    Colon cancer Neg Hx    Esophageal cancer Neg Hx    Inflammatory bowel disease Neg Hx    Liver disease Neg Hx    Pancreatic cancer Neg Hx    Rectal cancer Neg Hx    Stomach cancer Neg Hx      HOME MEDICATIONS: Allergies as of 09/19/2023       Reactions   Other Anaphylaxis, Other (See Comments)   [name] - anaphylaxis        Medication List        Accurate as of September 19, 2023  2:22 PM. If you have any questions, ask your nurse or doctor.          dicyclomine 10 MG capsule Commonly known as: BENTYL Take 1 capsule (10 mg total) by mouth 3 (three) times daily as needed for spasms (stomach cramps.).   gabapentin 100 MG capsule Commonly known as: Neurontin Take 1 capsule (100 mg total) by mouth 3 (three) times daily.    HYDROmorphone 4 MG tablet Commonly known as: DILAUDID Take 1 tablet (4 mg total) by mouth every 4 (four) hours as needed for pain.  Max daily dose 6 tablets.   HYDROmorphone 4 MG tablet Commonly known as: DILAUDID Take 1 tablet (4 mg total) by mouth every 4 (four) hours as needed for pain. Max daily dose: 6 tablets.   insulin aspart 100 UNIT/ML injection Commonly known as: novoLOG 60 units a day What changed:  how much to take how to take this when to take this additional instructions   insulin glargine 100 UNIT/ML injection Commonly known as: LANTUS Inject 16 Units into the skin in the morning.   lipase/protease/amylase 16109 UNITS Cpep capsule Commonly known as: Creon Take 1-2 capsules (36,000-72,000 Units total) by mouth See admin instructions. Take 72,000 units by mouth three times a day before meals and 36,000 units with any snacks   multivitamin Tabs tablet Take 1 tablet by mouth daily with breakfast.   naloxone 4 MG/0.1ML Liqd nasal spray kit Commonly known as: NARCAN Place 1 spray into the nose once as needed (Overdose).   omeprazole 40 MG capsule Commonly known as: PRILOSEC TAKE 1 CAPSULE (40 MG TOTAL) BY MOUTH DAILY.   polyethylene glycol 17 g packet Commonly known as: MiraLax Take 17 g by mouth daily.  ALLERGIES: Allergies  Allergen Reactions   Other Anaphylaxis and Other (See Comments)    [name] - anaphylaxis     REVIEW OF SYSTEMS: A comprehensive ROS was conducted with the patient and is negative except as per HPI and below:      OBJECTIVE:   VITAL SIGNS: BP 120/72 (BP Location: Left Arm, Patient Position: Sitting, Cuff Size: Small)   Pulse 78   Ht 6\' 5"  (1.956 m)   Wt 163 lb (73.9 kg)   SpO2 98%   BMI 19.33 kg/m    PHYSICAL EXAM:  General: Pt appears well and is in NAD  Lungs: Clear with good BS bilat   Heart: RRR   Abdomen: Non tender with guarding   Extremities:  Lower extremities - No pretibial edema.   Neuro:  MS is good with appropriate affect, pt is alert and Ox3    DM foot exam: 09/19/2023  The skin of the feet is intact without sores or ulcerations. The pedal pulses are 2+ on right and 2+ on left. The sensation is absent to a screening 5.07, 10 gram monofilament at the great toes bilaterally   DATA REVIEWED:  Lab Results  Component Value Date   HGBA1C 7.9 (A) 09/19/2023   HGBA1C 8.3 (H) 07/06/2023   HGBA1C 8.6 (H) 04/06/2023     Latest Reference Range & Units 08/29/23 04:28  Sodium 135 - 145 mmol/L 133 (L)  Potassium 3.5 - 5.1 mmol/L 3.7  Chloride 98 - 111 mmol/L 98  CO2 22 - 32 mmol/L 24  Glucose 70 - 99 mg/dL 161 (H)  BUN 6 - 20 mg/dL 7  Creatinine 0.96 - 0.45 mg/dL 4.09  Calcium 8.9 - 81.1 mg/dL 9.4  Anion gap 5 - 15  11  Magnesium 1.7 - 2.4 mg/dL 1.9  GFR, Estimated >91 mL/min >60  (L): Data is abnormally low (H): Data is abnormally high   In office BG 257 mg/DL  ASSESSMENT / PLAN / RECOMMENDATIONS:   1) Type 1 Diabetes Mellitus, Poorly  controlled, With neuropathic complications - Most recent A1c of 7.9 %. Goal A1c < 7.0 %.     -A1c improving but continues to be above goal -He has not used the OmniPod in 2024, as he was initially using his cell phone but this has become incompatible with his new phone and has now met with our CDE to transition to PDM use -He has been using the Dexcom but no data today -Patient continues to use less NovoLog than previously prescribed, I will increase his NovoLog as below -Patient encouraged to continue with using correction scale -Patient has loss of monofilament sensation at bilateral great toes, discussed neuropathy, discussed importance of optimizing glucose control to prevent microvascular complications including increased risk of amputation, blindness, and CKD    MEDICATIONS: Continue Lantus 16 units daily Increase NovoLog 6 units 3 times daily before every meal Continue CF: NovoLog (BG-130/50)  TIDQAC       EDUCATION / INSTRUCTIONS: BG monitoring instructions: Patient is instructed to check his blood sugars 3 times a day, before meals . Call [facility] if: BG persistently < 70  I reviewed the Rule of 15 for the treatment of hypoglycemia in detail with the patient. Literature supplied.   2) Diabetic complications:  Eye: Does not have known diabetic retinopathy.  Neuro/ Feet: Does not have known diabetic peripheral neuropathy. Renal: Patient does not have known baseline CKD. He is not on an ACEI/ARB at present.  3) Lipids: Lipid panel  is acceptable, no indication for statin therapy at this time   Follow-up in 4 months   Signed electronically by: Lyndle Herrlich, MD  [facility]  [facility] [address] [name] [place], [place] [address] Phone: [phone] FAX: [phone]   CC: Caesar Bookman, NP [address] [place] [address] Phone: [phone]  Fax: [phone]    Return to Endocrinology clinic as below: Future Appointments  Date Time Provider Department Center  11/07/2023  9:00 AM [name], Donalee Citrin, NP [facility] None  11/10/2023  2:30 PM [name], [name], MD [facility]  02/13/2024  9:00 AM [name], Donalee Citrin, NP [facility] None

## 2023-10-10 ENCOUNTER — Other Ambulatory Visit (HOSPITAL_BASED_OUTPATIENT_CLINIC_OR_DEPARTMENT_OTHER): Payer: Self-pay

## 2023-10-10 MED ORDER — BUPRENORPHINE 7.5 MCG/HR TD PTWK
1.0000 | MEDICATED_PATCH | TRANSDERMAL | 0 refills | Status: DC
  Filled 2023-10-10: qty 4, 28d supply, fill #0

## 2023-10-10 MED ORDER — HYDROMORPHONE HCL 4 MG PO TABS
4.0000 mg | ORAL_TABLET | ORAL | 0 refills | Status: DC | PRN
  Filled 2023-10-10: qty 12, 2d supply, fill #0
  Filled 2023-10-11 (×2): qty 168, 28d supply, fill #1

## 2023-10-11 ENCOUNTER — Other Ambulatory Visit (HOSPITAL_BASED_OUTPATIENT_CLINIC_OR_DEPARTMENT_OTHER): Payer: Self-pay

## 2023-10-11 ENCOUNTER — Other Ambulatory Visit: Payer: Self-pay

## 2023-10-11 MED ORDER — HYDROMORPHONE HCL 4 MG PO TABS
4.0000 mg | ORAL_TABLET | ORAL | 0 refills | Status: DC | PRN
  Filled 2023-10-11 (×2): qty 168, 28d supply, fill #0

## 2023-10-12 ENCOUNTER — Other Ambulatory Visit (HOSPITAL_BASED_OUTPATIENT_CLINIC_OR_DEPARTMENT_OTHER): Payer: Self-pay

## 2023-10-29 ENCOUNTER — Other Ambulatory Visit: Payer: Self-pay | Admitting: Family Medicine

## 2023-10-29 DIAGNOSIS — G8929 Other chronic pain: Secondary | ICD-10-CM

## 2023-11-07 ENCOUNTER — Encounter: Payer: Self-pay | Admitting: Family

## 2023-11-07 ENCOUNTER — Ambulatory Visit: Payer: Medicaid Other | Admitting: Family

## 2023-11-07 ENCOUNTER — Other Ambulatory Visit (HOSPITAL_BASED_OUTPATIENT_CLINIC_OR_DEPARTMENT_OTHER): Payer: Self-pay

## 2023-11-07 ENCOUNTER — Ambulatory Visit (INDEPENDENT_AMBULATORY_CARE_PROVIDER_SITE_OTHER): Payer: Medicaid Other | Admitting: Family

## 2023-11-07 VITALS — BP 130/78 | HR 94 | Temp 97.4°F | Resp 20 | Ht 77.0 in | Wt 160.0 lb

## 2023-11-07 DIAGNOSIS — E1065 Type 1 diabetes mellitus with hyperglycemia: Secondary | ICD-10-CM | POA: Diagnosis not present

## 2023-11-07 DIAGNOSIS — D649 Anemia, unspecified: Secondary | ICD-10-CM

## 2023-11-07 DIAGNOSIS — K2100 Gastro-esophageal reflux disease with esophagitis, without bleeding: Secondary | ICD-10-CM

## 2023-11-07 MED ORDER — HYDROMORPHONE HCL 4 MG PO TABS
4.0000 mg | ORAL_TABLET | ORAL | 0 refills | Status: DC | PRN
  Filled 2023-11-07 – 2023-11-08 (×3): qty 180, 30d supply, fill #0

## 2023-11-07 NOTE — Progress Notes (Signed)
Provider: Richarda Blade FNP-C   Vincenzo Stave, Donalee Citrin, NP  Patient Care Team: Elsbeth Yearick, Donalee Citrin, NP as PCP - General (Family Medicine) Flo Shanks, MD as Consulting Physician (Otolaryngology)  Extended Emergency Contact Information Primary Emergency Contact: brathwaite,naketa Address: [address]          [place], [place] [address] [name] of [place] Home Phone: [phone] Work Phone: [phone] Mobile Phone: [phone] Relation: Significant other  Code Status:  Full Code  Goals of care: Advanced Directive information    11/07/2023    9:55 AM  Advanced Directives  Does Patient Have a Medical Advance Directive? No  Would patient like information on creating a medical advance directive? No - Patient declined     Chief Complaint  Patient presents with   Medical Management of Chronic Issues    4 month follow up and discuss eye exam,flu and covid vaccines.    HPI:  Pt is a 37 y.o. [name] seen today for 4 months follow-up for medical management of chronic diseases.He was previously following up with Dr. Jacalyn Lefevre who retired.  Type 2 DM - CBG in 150's -170's.States latest A1C 8.3 but states saw Endocrinologist in November 09/2023  7.0  Has upcoming appointment with Ophthalmology 12/2023  Follows up with Endocrinologist every 3 months. Has numbness and tingling left great toe.Follows up with [facility].   RUQ abdominal Pain - on dilaudid 4 mg every PRN managed by pain management.states would like something to take in between his dilaudid.I have advised him to discuss with pain specialist to avoid getting pain meds from two providers. States was on Buprenorphine patch weekly but had skin irritation on the application site so medication was discontinued.   Depression - states sometimes does not feel like getting out of the bed due to feeling depressed but does not want to start on antidepressant.    Smoker - smokes one cigar per day. Discussed smoking  cessation.   Past Medical History:  Diagnosis Date   AKI (acute kidney injury) (HCC) 12/21/2021   Anxiety    Diabetes (HCC)    Pancreatitis    Splenic vein thrombosis    Past Surgical History:  Procedure Laterality Date   BILIARY BRUSHING  02/02/2023   Procedure: BILIARY BRUSHING;  Surgeon: [name] [name], MD;  Location: [facility];  Service: Gastroenterology;;   BILIARY BRUSHING  04/28/2023   Procedure: BILIARY BRUSHING;  Surgeon: Meryl Dare, MD;  Location: [facility] ENDOSCOPY;  Service: Gastroenterology;;   BILIARY BRUSHING  06/02/2023   Procedure: BILIARY BRUSHING;  Surgeon: Lemar Lofty., MD;  Location: [facility];  Service: Gastroenterology;;   BILIARY DILATION  12/01/2022   Procedure: BILIARY DILATION;  Surgeon: Lemar Lofty., MD;  Location: [facility];  Service: Gastroenterology;;   BILIARY DILATION  02/02/2023   Procedure: BILIARY DILATION;  Surgeon: Lemar Lofty., MD;  Location: [facility];  Service: Gastroenterology;;   BILIARY STENT PLACEMENT  04/28/2023   Procedure: BILIARY STENT PLACEMENT;  Surgeon: Meryl Dare, MD;  Location: [facility] ENDOSCOPY;  Service: Gastroenterology;;   BILIARY STENT PLACEMENT N/A 06/02/2023   Procedure: BILIARY STENT PLACEMENT;  Surgeon: Lemar Lofty., MD;  Location: [facility] ENDOSCOPY;  Service: Gastroenterology;  Laterality: N/A;   BIOPSY  12/02/2021   Procedure: BIOPSY;  Surgeon: [name] [name], MD;  Location: [facility];  Service: Gastroenterology;;   ENDOSCOPIC RETROGRADE CHOLANGIOPANCREATOGRAPHY (ERCP) WITH PROPOFOL N/A 08/12/2022   Procedure: ENDOSCOPIC RETROGRADE CHOLANGIOPANCREATOGRAPHY (ERCP) WITH PROPOFOL;  Surgeon: Lemar Lofty., MD;  Location: [facility]OSCOPY;  Service: Gastroenterology;  Laterality: N/A;   ENDOSCOPIC RETROGRADE CHOLANGIOPANCREATOGRAPHY (ERCP) WITH PROPOFOL N/A 12/01/2022   Procedure: ENDOSCOPIC RETROGRADE CHOLANGIOPANCREATOGRAPHY (ERCP) WITH PROPOFOL;   Surgeon: [name] [name], MD;  Location: [facility];  Service: Gastroenterology;  Laterality: N/A;   ENDOSCOPIC RETROGRADE CHOLANGIOPANCREATOGRAPHY (ERCP) WITH PROPOFOL N/A 02/02/2023   Procedure: ENDOSCOPIC RETROGRADE CHOLANGIOPANCREATOGRAPHY (ERCP) WITH PROPOFOL;  Surgeon: [name] [name], MD;  Location: [facility];  Service: Gastroenterology;  Laterality: N/A;   ENDOSCOPIC RETROGRADE CHOLANGIOPANCREATOGRAPHY (ERCP) WITH PROPOFOL N/A 06/02/2023   Procedure: ENDOSCOPIC RETROGRADE CHOLANGIOPANCREATOGRAPHY (ERCP) WITH PROPOFOL;  Surgeon: [name] [name], MD;  Location: [facility];  Service: Gastroenterology;  Laterality: N/A;   ERCP N/A 04/28/2023   Procedure: ENDOSCOPIC RETROGRADE CHOLANGIOPANCREATOGRAPHY (ERCP);  Surgeon: Meryl Dare, MD;  Location: [facility] ENDOSCOPY;  Service: Gastroenterology;  Laterality: N/A;   ESOPHAGOGASTRODUODENOSCOPY N/A 12/01/2022   Procedure: ESOPHAGOGASTRODUODENOSCOPY (EGD);  Surgeon: Lemar Lofty., MD;  Location: [facility];  Service: Gastroenterology;  Laterality: N/A;   ESOPHAGOGASTRODUODENOSCOPY (EGD) WITH PROPOFOL N/A 12/02/2021   Procedure: ESOPHAGOGASTRODUODENOSCOPY (EGD) WITH PROPOFOL;  Surgeon: [name] [name], MD;  Location: [facility];  Service: Gastroenterology;  Laterality: N/A;   ESOPHAGOGASTRODUODENOSCOPY (EGD) WITH PROPOFOL N/A 07/09/2023   Procedure: ESOPHAGOGASTRODUODENOSCOPY (EGD) WITH PROPOFOL;  Surgeon: Hilarie Fredrickson, MD;  Location: [facility];  Service: Gastroenterology;  Laterality: N/A;  needs ercp scope for stent pull   FINE NEEDLE ASPIRATION N/A 12/01/2022   Procedure: FINE NEEDLE ASPIRATION (FNA) LINEAR;  Surgeon: Lemar Lofty., MD;  Location: [facility];  Service: Gastroenterology;  Laterality: N/A;   NEUROLYTIC CELIAC PLEXUS  12/01/2022   Procedure: CELIAC PLEXUS BLOCK;  Surgeon: [name] [name], MD;  Location: [facility];  Service: Gastroenterology;;   PANCREATIC  STENT PLACEMENT  08/12/2022   Procedure: PANCREATIC STENT PLACEMENT;  Surgeon: Lemar Lofty., MD;  Location: [facility];  Service: Gastroenterology;;   PANCREATIC STENT PLACEMENT  12/01/2022   Procedure: PANCREATIC STENT PLACEMENT;  Surgeon: Lemar Lofty., MD;  Location: [facility];  Service: Gastroenterology;;   PANCREATIC STENT PLACEMENT  02/02/2023   Procedure: PANCREATIC STENT PLACEMENT;  Surgeon: Lemar Lofty., MD;  Location: [facility];  Service: Gastroenterology;;   PANCREATIC STENT PLACEMENT  06/02/2023   Procedure: PANCREATIC STENT PLACEMENT;  Surgeon: Lemar Lofty., MD;  Location: [facility];  Service: Gastroenterology;;   REMOVAL OF STONES  12/01/2022   Procedure: REMOVAL OF STONES;  Surgeon: Lemar Lofty., MD;  Location: [facility];  Service: Gastroenterology;;   REMOVAL OF STONES  02/02/2023   Procedure: REMOVAL OF STONES;  Surgeon: Lemar Lofty., MD;  Location: [facility];  Service: Gastroenterology;;   REMOVAL OF STONES  06/02/2023   Procedure: REMOVAL OF STONES;  Surgeon: Lemar Lofty., MD;  Location: [facility];  Service: Gastroenterology;;   [name]  08/12/2022   Procedure: [name];  Surgeon: Lemar Lofty., MD;  Location: [facility];  Service: Gastroenterology;;   [name]  12/01/2022   Procedure: [name];  Surgeon: Lemar Lofty., MD;  Location: [facility];  Service: Gastroenterology;;   [name]  06/02/2023   Procedure: [name];  Surgeon: Lemar Lofty., MD;  Location: [facility];  Service: Gastroenterology;;   Francine Graven REMOVAL  12/01/2022   Procedure: STENT REMOVAL;  Surgeon: Lemar Lofty., MD;  Location: [facility];  Service: Gastroenterology;;   Francine Graven REMOVAL  02/02/2023   Procedure: STENT REMOVAL;  Surgeon: Lemar Lofty., MD;  Location: [facility];  Service: Gastroenterology;;   Francine Graven REMOVAL   06/02/2023  Procedure: STENT REMOVAL;  Surgeon: Lemar Lofty., MD;  Location: [facility];  Service: Gastroenterology;;   Francine Graven REMOVAL  07/09/2023   Procedure: STENT REMOVAL;  Surgeon: Hilarie Fredrickson, MD;  Location: [facility];  Service: Gastroenterology;;   UPPER ESOPHAGEAL ENDOSCOPIC ULTRASOUND (EUS) N/A 12/02/2021   Procedure: UPPER ESOPHAGEAL ENDOSCOPIC ULTRASOUND (EUS);  Surgeon: Lemar Lofty., MD;  Location: [facility];  Service: Gastroenterology;  Laterality: N/A;   UPPER ESOPHAGEAL ENDOSCOPIC ULTRASOUND (EUS) N/A 12/01/2022   Procedure: UPPER ESOPHAGEAL ENDOSCOPIC ULTRASOUND (EUS);  Surgeon: Lemar Lofty., MD;  Location: [facility];  Service: Gastroenterology;  Laterality: N/A;    Allergies  Allergen Reactions   Other Anaphylaxis and Other (See Comments)    [name] - anaphylaxis    Allergies as of 11/07/2023       Reactions   Other Anaphylaxis, Other (See Comments)   [name] - anaphylaxis        Medication List        Accurate as of November 07, 2023 10:07 AM. If you have any questions, ask your nurse or doctor.          Butrans 7.5 MCG/HR Generic drug: buprenorphine Place 1 patch onto the skin once a week.   Dexcom G7 Sensor Misc 1 Device by Does not apply route as directed.   dicyclomine 10 MG capsule Commonly known as: BENTYL Take 1 capsule (10 mg total) by mouth 3 (three) times daily as needed for spasms (stomach cramps.).   gabapentin 100 MG capsule Commonly known as: Neurontin Take 1 capsule (100 mg total) by mouth 3 (three) times daily.   HYDROmorphone 4 MG tablet Commonly known as: DILAUDID Take 1 tablet (4 mg total) by mouth every 4 (four) hours as needed for pain.  Max daily dose 6 tablets. What changed: Another medication with the same name was added. Make sure you understand how and when to take each.   HYDROmorphone 4 MG tablet Commonly known as: DILAUDID Take 1 tablet (4 mg total) by mouth  every 4 (four) hours as needed for pain. Max daily dose: 6 tablets. What changed: Another medication with the same name was added. Make sure you understand how and when to take each.   HYDROmorphone 4 MG tablet Commonly known as: DILAUDID Take 1 tablet (4 mg total) by mouth every 4 (four) hours as needed for pain. Max daily dose: 6 tablets. What changed: Another medication with the same name was added. Make sure you understand how and when to take each.   HYDROmorphone 4 MG tablet Commonly known as: DILAUDID Take 1 tablet (4 mg total) by mouth every 4 (four) hours as needed for pain. Max daily dose: 6 tablets. What changed: Another medication with the same name was added. Make sure you understand how and when to take each.   HYDROmorphone 4 MG tablet Commonly known as: DILAUDID Take 1 tablet every four hours, as needed for pain, max daily dose: 6 Tablet What changed: You were already taking a medication with the same name, and this prescription was added. Make sure you understand how and when to take each.   Insulin Pen Needle 32G X 4 MM Misc 1 Device by Does not apply route in the morning, at noon, in the evening, and at bedtime.   Lantus SoloStar 100 UNIT/ML Solostar Pen Generic drug: insulin glargine Inject 16 Units into the skin daily.   lipase/protease/amylase 16109 UNITS Cpep capsule Commonly known as: Creon Take 1-2 capsules (36,000-72,000 Units total) by mouth  See admin instructions. Take 72,000 units by mouth three times a day before meals and 36,000 units with any snacks   multivitamin Tabs tablet Take 1 tablet by mouth daily with breakfast.   naloxone 4 MG/0.1ML Liqd nasal spray kit Commonly known as: NARCAN Place 1 spray into the nose once as needed (Overdose).   NovoLOG FlexPen 100 UNIT/ML FlexPen Generic drug: insulin aspart Max daily 30 units   omeprazole 40 MG capsule Commonly known as: PRILOSEC TAKE 1 CAPSULE (40 MG TOTAL) BY MOUTH DAILY.   polyethylene  glycol 17 g packet Commonly known as: MiraLax Take 17 g by mouth daily.        Review of Systems  Constitutional:  Negative for appetite change, chills, fatigue, fever and unexpected weight change.  HENT:  Negative for congestion, ear discharge, ear pain, hearing loss, nosebleeds, postnasal drip, rhinorrhea, sinus pressure, sinus pain, sneezing, sore throat, tinnitus and trouble swallowing.   Eyes:  Negative for pain, discharge, redness, itching and visual disturbance.  Respiratory:  Negative for cough, chest tightness, shortness of breath and wheezing.   Cardiovascular:  Negative for chest pain, palpitations and leg swelling.  Gastrointestinal:  Positive for abdominal pain. Negative for abdominal distention, blood in stool, constipation, diarrhea, nausea and vomiting.       Chronic mid - RUQ pain   Endocrine: Negative for cold intolerance, heat intolerance, polydipsia, polyphagia and polyuria.  Genitourinary:  Negative for difficulty urinating, dysuria, flank pain, frequency and urgency.  Musculoskeletal:  Negative for arthralgias, back pain, gait problem, joint swelling, myalgias, neck pain and neck stiffness.  Skin:  Negative for color change, pallor, rash and wound.  Neurological:  Negative for dizziness, syncope, speech difficulty, weakness, light-headedness, numbness and headaches.  Hematological:  Does not bruise/bleed easily.  Psychiatric/Behavioral:  Negative for agitation, behavioral problems, confusion, hallucinations, self-injury, sleep disturbance and suicidal ideas. The patient is not nervous/anxious.     Immunization History  Administered Date(s) Administered   Influenza,inj,Quad PF,6+ Mos 09/15/2021   Influenza-Unspecified 08/09/2022   Tdap 10/08/2022   Pertinent  Health Maintenance Due  Topic Date Due   OPHTHALMOLOGY EXAM  Never done   INFLUENZA VACCINE  06/09/2023   HEMOGLOBIN A1C  03/18/2024   FOOT EXAM  09/18/2024      11/11/2022    9:20 PM 11/12/2022    7:01  AM 01/31/2023    2:25 PM 07/06/2023    8:59 AM 11/07/2023    9:55 AM  Fall Risk  Falls in the past year?   0 0 0  Was there an injury with Fall?   0 0 0  Fall Risk Category Calculator   0 0 0  (RETIRED) Patient Fall Risk Level Low fall risk Low fall risk     Patient at Risk for Falls Due to   No Fall Risks No Fall Risks   Fall risk Follow up   Falls evaluation completed Falls evaluation completed;Education provided;Falls prevention discussed    Functional Status Survey:    Vitals:   11/07/23 1000  BP: 130/78  Pulse: 94  Resp: 20  Temp: (!) 97.4 F (36.3 C)  SpO2: 95%  Weight: 160 lb (72.6 kg)  Height: 6\' 5"  (1.956 m)   Body mass index is 18.97 kg/m. Physical Exam Vitals reviewed.  Constitutional:      General: He is not in acute distress.    Appearance: Normal appearance. He is underweight. He is not ill-appearing or diaphoretic.  HENT:     Head: Normocephalic.  Right Ear: There is impacted cerumen.     Left Ear: There is impacted cerumen.     Nose: Nose normal. No congestion or rhinorrhea.     Mouth/Throat:     Mouth: Mucous membranes are moist.     Pharynx: Oropharynx is clear. No oropharyngeal exudate or posterior oropharyngeal erythema.  Eyes:     General: No scleral icterus.       Right eye: No discharge.        Left eye: No discharge.     Extraocular Movements: Extraocular movements intact.     Conjunctiva/sclera: Conjunctivae normal.     Pupils: Pupils are equal, round, and reactive to light.  Neck:     Vascular: No carotid bruit.  Cardiovascular:     Rate and Rhythm: Normal rate and regular rhythm.     Pulses: Normal pulses.     Heart sounds: Normal heart sounds. No murmur heard.    No friction rub. No gallop.  Pulmonary:     Effort: Pulmonary effort is normal. No respiratory distress.     Breath sounds: Normal breath sounds. No wheezing, rhonchi or rales.  Chest:     Chest wall: No tenderness.  Abdominal:     General: Bowel sounds are normal.  There is no distension.     Palpations: Abdomen is soft. There is no mass.     Tenderness: There is no abdominal tenderness. There is no right CVA tenderness, left CVA tenderness, guarding or rebound.  Musculoskeletal:        General: No swelling or tenderness. Normal range of motion.     Cervical back: Normal range of motion. No rigidity or tenderness.     Right lower leg: No edema.     Left lower leg: No edema.  Lymphadenopathy:     Cervical: No cervical adenopathy.  Skin:    General: Skin is warm and dry.     Coloration: Skin is not pale.     Findings: No bruising, erythema, lesion or rash.  Neurological:     Mental Status: He is alert and oriented to person, place, and time.     Cranial Nerves: No cranial nerve deficit.     Sensory: No sensory deficit.     Motor: No weakness.     Coordination: Coordination normal.     Gait: Gait normal.  Psychiatric:        Mood and Affect: Mood is depressed.        Speech: Speech normal.        Behavior: Behavior normal.        Thought Content: Thought content normal.        Judgment: Judgment normal.     Labs reviewed: Recent Labs    04/25/23 0459 04/25/23 0500 04/26/23 0446 04/27/23 0254 04/30/23 0534 06/02/23 1032 08/27/23 0639 08/28/23 0340 08/29/23 0428  NA  --    < > 132*   < > 134*   < > 138 137 133*  K  --    < > 3.4*   < > 3.7   < > 3.2* 4.0 3.7  CL  --    < > 99   < > 97*   < > 101 103 98  CO2  --    < > 21*   < > 27   < > 27 26 24   GLUCOSE  --    < > 186*   < > 206*   < > 89 213* 184*  BUN  --    < > <5*   < > <5*   < > 15 8 7   CREATININE  --    < > 0.86   < > 0.94   < > 0.64 0.67 0.66  CALCIUM  --    < > 8.6*   < > 9.1   < > 9.2 8.8* 9.4  MG 1.7  --  1.7   < > 1.8  --   --  1.9 1.9  PHOS 3.3  --  3.4  --   --   --   --   --   --    < > = values in this interval not displayed.   Recent Labs    07/07/23 1656 08/07/23 0428 08/25/23 1858  AST 23 21 77*  ALT 17 34 133*  ALKPHOS 106 373* 788*  BILITOT 0.9 0.7  1.1  PROT 8.7* 9.0* 8.4*  ALBUMIN 4.8 5.3* 4.6   Recent Labs    02/03/23 1229 02/03/23 1646 04/22/23 1509 04/23/23 0303 08/25/23 2346 08/27/23 0639 08/28/23 0340 08/29/23 0428  WBC 4.8   < > 5.1   < > 5.3 3.9* 3.0* 5.4  NEUTROABS 3.0  --  3.6  --  3.5  --   --   --   HGB 14.0   < > 14.0   < > 15.0 12.8* 11.9* 13.0  HCT 41.5   < > 42.3   < > 45.0 37.5* 35.7* 39.2  MCV 104.5*   < > 105.0*   < > 108.4* 106.2* 109.2* 109.8*  PLT 156   < > 179   < > 205 142* 119* 119*   < > = values in this interval not displayed.   Lab Results  Component Value Date   TSH 0.81 03/10/2022   Lab Results  Component Value Date   HGBA1C 7.9 (A) 09/19/2023   Lab Results  Component Value Date   CHOL 104 03/10/2022   HDL 56.50 03/10/2022   LDLCALC 18 03/10/2022   TRIG 153 (H) 07/20/2022   CHOLHDL 2 03/10/2022    Significant Diagnostic Results in last 30 days:  No results found.  Assessment/Plan 1. Type 1 diabetes mellitus with hyperglycemia (HCC) (Primary) Lab Results  Component Value Date   HGBA1C 7.9 (A) 09/19/2023  -Continue home Lantus and NovoLog -Continue to follow-up with endocrinologist -Continue to follow-up for annual foot and eye exam with podiatrist and ophthalmologist - CBC with Differential/Platelet - COMPLETE METABOLIC PANEL WITH GFR - TSH  2. Normocytic anemia Hemoglobin has improved -Will recheck iron levels -Continue on multivitamins - CBC with Differential/Platelet  3. Gastroesophageal reflux disease with esophagitis without hemorrhage Continues to complain of epigastric and chronic right upper quadrant pain -Continue follow-up with gastroenterology -Continue on omeprazole -Continue current pain regimen and follow-up with pain management clinic   Family/ staff Communication: Reviewed plan of care with patient verbalized understanding  Labs/tests ordered:  - CBC with Differential/Platelet - COMPLETE METABOLIC PANEL WITH GFR - TSH  Next Appointment :  Return in about 6 months (around 05/07/2024) for medical mangement of chronic issues.Caesar Bookman, NP

## 2023-11-08 ENCOUNTER — Other Ambulatory Visit: Payer: Self-pay

## 2023-11-08 ENCOUNTER — Other Ambulatory Visit (HOSPITAL_BASED_OUTPATIENT_CLINIC_OR_DEPARTMENT_OTHER): Payer: Self-pay

## 2023-11-09 DIAGNOSIS — K8020 Calculus of gallbladder without cholecystitis without obstruction: Secondary | ICD-10-CM

## 2023-11-09 HISTORY — DX: Calculus of gallbladder without cholecystitis without obstruction: K80.20

## 2023-11-09 LAB — COMPLETE METABOLIC PANEL WITH GFR
AG Ratio: 1.4 (calc) (ref 1.0–2.5)
ALT: 32 U/L (ref 9–46)
AST: 67 U/L — ABNORMAL HIGH (ref 10–40)
Albumin: 4.3 g/dL (ref 3.6–5.1)
Alkaline phosphatase (APISO): 316 U/L — ABNORMAL HIGH (ref 36–130)
BUN: 12 mg/dL (ref 7–25)
CO2: 28 mmol/L (ref 20–32)
Calcium: 9.6 mg/dL (ref 8.6–10.3)
Chloride: 101 mmol/L (ref 98–110)
Creat: 0.87 mg/dL (ref 0.60–1.26)
Globulin: 3.1 g/dL (ref 1.9–3.7)
Glucose, Bld: 277 mg/dL — ABNORMAL HIGH (ref 65–99)
Potassium: 4.3 mmol/L (ref 3.5–5.3)
Sodium: 140 mmol/L (ref 135–146)
Total Bilirubin: 0.5 mg/dL (ref 0.2–1.2)
Total Protein: 7.4 g/dL (ref 6.1–8.1)
eGFR: 114 mL/min/{1.73_m2} (ref >=60)

## 2023-11-09 LAB — CBC WITH DIFFERENTIAL/PLATELET
Absolute Lymphocytes: 645 {cells}/uL — ABNORMAL LOW (ref 850–3900)
Absolute Monocytes: 288 {cells}/uL (ref 200–950)
Basophils Absolute: 21 {cells}/uL (ref 0–200)
Basophils Relative: 0.7 %
Eosinophils Absolute: 120 {cells}/uL (ref 15–500)
Eosinophils Relative: 4 %
HCT: 41.8 % (ref 38.5–50.0)
Hemoglobin: 14 g/dL (ref 13.2–17.1)
MCH: 35.1 pg — ABNORMAL HIGH (ref 27.0–33.0)
MCHC: 33.5 g/dL (ref 32.0–36.0)
MCV: 104.8 fL — ABNORMAL HIGH (ref 80.0–100.0)
MPV: 11.2 fL (ref 7.5–12.5)
Monocytes Relative: 9.6 %
Neutro Abs: 1926 {cells}/uL (ref 1500–7800)
Neutrophils Relative %: 64.2 %
Platelets: 169 10*3/uL (ref 140–400)
RBC: 3.99 10*6/uL — ABNORMAL LOW (ref 4.20–5.80)
RDW: 11 % (ref 11.0–15.0)
Total Lymphocyte: 21.5 %
WBC: 3 10*3/uL — ABNORMAL LOW (ref 3.8–10.8)

## 2023-11-09 LAB — TEST AUTHORIZATION

## 2023-11-09 LAB — VITAMIN B12: Vitamin B-12: 427 pg/mL (ref 200–1100)

## 2023-11-09 LAB — TSH: TSH: 0.5 m[IU]/L (ref 0.40–4.50)

## 2023-11-10 ENCOUNTER — Ambulatory Visit: Payer: Medicaid Other | Admitting: Gastroenterology

## 2023-11-10 ENCOUNTER — Encounter: Payer: Self-pay | Admitting: Gastroenterology

## 2023-11-10 VITALS — BP 122/70 | HR 112 | Ht 74.75 in | Wt 164.0 lb

## 2023-11-10 DIAGNOSIS — K561 Intussusception: Secondary | ICD-10-CM

## 2023-11-10 DIAGNOSIS — K861 Other chronic pancreatitis: Secondary | ICD-10-CM

## 2023-11-10 DIAGNOSIS — K8681 Exocrine pancreatic insufficiency: Secondary | ICD-10-CM

## 2023-11-10 DIAGNOSIS — Z9889 Other specified postprocedural states: Secondary | ICD-10-CM

## 2023-11-10 DIAGNOSIS — K8020 Calculus of gallbladder without cholecystitis without obstruction: Secondary | ICD-10-CM

## 2023-11-10 DIAGNOSIS — K831 Obstruction of bile duct: Secondary | ICD-10-CM

## 2023-11-10 NOTE — Patient Instructions (Addendum)
 You have been scheduled for an endoscopy. Please follow written instructions given to you at your visit today.  If you use inhalers (even only as needed), please bring them with you on the day of your procedure.  If you take any of the following medications, they will need to be adjusted prior to your procedure:   DO NOT TAKE 7 DAYS PRIOR TO TEST- Trulicity (dulaglutide) Ozempic, Wegovy (semaglutide) Mounjaro (tirzepatide) Bydureon Bcise (exanatide extended release)  DO NOT TAKE 1 DAY PRIOR TO YOUR TEST Rybelsus (semaglutide) Adlyxin (lixisenatide) Victoza (liraglutide) Byetta (exanatide) ___________________________________________________________________________   You will need labs in 3 weeks. Please come to our lab located in basement.   _______________________________________________________  If your blood pressure at your visit was 140/90 or greater, please contact your primary care physician to follow up on this.  _______________________________________________________  If you are age 38 or older, your body mass index should be between 23-30. Your Body mass index is 20.64 kg/m. If this is out of the aforementioned range listed, please consider follow up with your Primary Care Provider.  If you are age 38 or younger, your body mass index should be between 19-25. Your Body mass index is 20.64 kg/m. If this is out of the aformentioned range listed, please consider follow up with your Primary Care Provider.   ________________________________________________________  The [facility] providers would like to encourage you to use MYCHART to communicate with providers for non-urgent requests or questions.  Due to long hold times on the telephone, sending your provider a message by [facility] may be a faster and more efficient way to get a response.  Please allow 48 business hours for a response.  Please remember that this is for non-urgent requests.   _______________________________________________________  Due to recent changes in healthcare laws, you may see the results of your imaging and laboratory studies on MyChart before your provider has had a chance to review them.  We understand that in some cases there may be results that are confusing or concerning to you. Not all laboratory results come back in the same time frame and the provider may be waiting for multiple results in order to interpret others.  Please give us  48 hours in order for your provider to thoroughly review all the results before contacting the office for clarification of your results.   Thank you for choosing me and [facility].  Dr. Wilhelmenia

## 2023-11-10 NOTE — Progress Notes (Signed)
 GASTROENTEROLOGY OUTPATIENT CLINIC VISIT   Primary Care Provider [name], Roxan BROCKS, NP [address] [place] [address] [phone]   Patient Profile: Carlos [name] is a 38 y.o. 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  The patient presents to the [facility] for an evaluation and management of problem(s) noted below:  Problem List 1. Chronic pancreatitis, unspecified pancreatitis type (HCC)   2. Biliary stricture   3. History of biliary duct stent placement   4. Intussusception (HCC)   5. Calculus of gallbladder without cholecystitis without obstruction    Discussed the use of AI scribe software for clinical note transcription with the patient, who gave verbal consent to proceed.  History of Present Illness Please see prior note for full details of HPI.  Interval History The patient presents for follow-up.   I actually saw the patient in October when he was last hospitalized with recurrent abdominal pain and concern for recurrent pancreatitis versus multiple intra-abdominal intussusception.  He was seen by surgery and improved without need for surgical management.  He was found to have cholelithiasis at that time.  No surgical management was made.  He continues to report intermittent discomfort in the right upper quadrant of the abdomen, but no other new or worsening symptoms.  He has not had to be rehospitalized since.  The patient's condition appears to be stable, with good days and bad days, and he is currently managing his symptoms with the help of a pain management specialist.  The patient's recent liver function tests  showed slightly elevated AST and ALP.  He continues to manage his diabetes with help of endocrinology/PCP (recent drop in his A1C levels).  The patient expressed a preference for a longer-lasting solution to avoid back-to-back procedures and is hopeful he will not need a further stenting protocol for his known biliary stent in place.  No changes in his bowel habits otherwise.   GI Review of Systems Positive as above Negative for dysphagia, odynophagia, melena, hematochezia   Review of Systems General: Denies fevers/chills/unintentional weight loss Cardiovascular: Denies chest pain Pulmonary: Denies shortness of breath Gastroenterological: See HPI Genitourinary: Denies darkened urine Hematological: Denies easy bruising/bleeding Dermatological: Denies jaundice Psychological: Mood is stable   Medications Current Outpatient Medications  Medication Sig Dispense Refill   buprenorphine  (BUTRANS ) 7.5 MCG/HR Place 1 patch onto the skin once a week. 4 patch 0   Continuous Glucose Sensor (DEXCOM G7 SENSOR) MISC 1 Device by Does not apply route as directed. 9 each 3   HYDROmorphone  (DILAUDID ) 4 MG tablet Take 1 tablet every four hours, as needed for pain, max daily dose: 6 Tablet 180 tablet 0   insulin  aspart (NOVOLOG  FLEXPEN) 100 UNIT/ML FlexPen Max daily 30 units 30 mL 3   insulin  glargine (LANTUS  SOLOSTAR) 100 UNIT/ML Solostar Pen Inject 16 Units into the skin daily. 15 mL 4   Insulin  Pen Needle 32G X 4 MM MISC 1 Device by Does not apply route in the morning, at noon, in the evening, and at bedtime. 400 each 3   lipase/protease/amylase (CREON ) 36000 UNITS CPEP capsule Take 1-2 capsules (36,000-72,000 Units total) by mouth See admin instructions. Take 72,000 units by mouth three times a day before meals and 36,000 units with any snacks  300 capsule 4   multivitamin (ONE-A-DAY MEN'S) TABS tablet Take 1 tablet by mouth daily with breakfast.     naloxone  (NARCAN ) nasal spray 4 mg/0.1 mL Place 1 spray  into the nose once as needed (Overdose).     omeprazole  (PRILOSEC) 40 MG capsule TAKE 1 CAPSULE (40 MG TOTAL) BY MOUTH DAILY. 90 capsule 1   HYDROmorphone  (DILAUDID ) 4 MG tablet Take 1 tablet (4 mg total) by mouth every 4 (four) hours as needed for pain.  Max daily dose 6 tablets. (Patient not taking: Reported on 11/10/2023) 180 tablet 0   HYDROmorphone  (DILAUDID ) 4 MG tablet Take 1 tablet (4 mg total) by mouth every 4 (four) hours as needed for pain. Max daily dose: 6 tablets. (Patient not taking: Reported on 11/10/2023) 180 tablet 0   HYDROmorphone  (DILAUDID ) 4 MG tablet Take 1 tablet (4 mg total) by mouth every 4 (four) hours as needed for pain. Max daily dose: 6 tablets. (Patient not taking: Reported on 11/10/2023) 180 tablet 0   HYDROmorphone  (DILAUDID ) 4 MG tablet Take 1 tablet (4 mg total) by mouth every 4 (four) hours as needed for pain. Max daily dose: 6 tablets. (Patient not taking: Reported on 11/10/2023) 168 tablet 0   No current facility-administered medications for this visit.    Allergies Allergies  Allergen Reactions   Other Anaphylaxis and Other (See Comments)    [name] - anaphylaxis    Histories Past Medical History:  Diagnosis Date   AKI (acute kidney injury) (HCC) 12/21/2021   Anxiety    Diabetes (HCC)    Pancreatitis    Splenic vein thrombosis    Past Surgical History:  Procedure Laterality Date   BILIARY BRUSHING  02/02/2023   Procedure: BILIARY BRUSHING;  Surgeon: Wilhelmenia Aloha Raddle., MD;  Location: [facility];  Service: Gastroenterology;;   BILIARY BRUSHING  04/28/2023   Procedure: BILIARY BRUSHING;  Surgeon: Aneita Gwendlyn DASEN, MD;  Location: [facility];  Service: Gastroenterology;;   BILIARY BRUSHING  06/02/2023   Procedure: BILIARY BRUSHING;  Surgeon: Wilhelmenia Aloha Raddle., MD;  Location: [facility];  Service: Gastroenterology;;   BILIARY DILATION  12/01/2022   Procedure: BILIARY DILATION;  Surgeon: Wilhelmenia Aloha Raddle., MD;  Location: [facility];  Service: Gastroenterology;;   BILIARY DILATION  02/02/2023   Procedure: BILIARY DILATION;  Surgeon: Wilhelmenia Aloha Raddle., MD;  Location: [facility];  Service: Gastroenterology;;   BILIARY STENT PLACEMENT  04/28/2023   Procedure: BILIARY STENT PLACEMENT;  Surgeon: Aneita Gwendlyn DASEN, MD;  [facility];  Service: Gastroenterology;;   BILIARY STENT PLACEMENT N/A 06/02/2023   Procedure: BILIARY STENT PLACEMENT;  Surgeon: Wilhelmenia Aloha Raddle., MD;  Location: [facility];  Service: Gastroenterology;  Laterality: N/A;   BIOPSY  12/02/2021   Procedure: BIOPSY;  Surgeon: Wilhelmenia Aloha Raddle., MD;  Location: [facility];  Service: Gastroenterology;;   ENDOSCOPIC RETROGRADE CHOLANGIOPANCREATOGRAPHY (ERCP) WITH PROPOFOL  N/A 08/12/2022   Procedure: ENDOSCOPIC RETROGRADE CHOLANGIOPANCREATOGRAPHY (ERCP) WITH PROPOFOL ;  Surgeon: Wilhelmenia Aloha Raddle., MD;  Location: [facility];  Service: Gastroenterology;  Laterality: N/A;   ENDOSCOPIC RETROGRADE CHOLANGIOPANCREATOGRAPHY (ERCP) WITH PROPOFOL  N/A 12/01/2022   Procedure: ENDOSCOPIC RETROGRADE CHOLANGIOPANCREATOGRAPHY (ERCP) WITH PROPOFOL ;  Surgeon: Wilhelmenia Aloha Raddle., MD;  Location: [facility];  Service: Gastroenterology;  Laterality: N/A;   ENDOSCOPIC RETROGRADE CHOLANGIOPANCREATOGRAPHY (ERCP) WITH PROPOFOL  N/A 02/02/2023   Procedure: ENDOSCOPIC RETROGRADE CHOLANGIOPANCREATOGRAPHY (ERCP) WITH PROPOFOL ;  Surgeon: Wilhelmenia Aloha Raddle., MD;  Location: [facility];  Service: Gastroenterology;  Laterality: N/A;   ENDOSCOPIC RETROGRADE CHOLANGIOPANCREATOGRAPHY (ERCP) WITH PROPOFOL  N/A 06/02/2023  Procedure: ENDOSCOPIC RETROGRADE CHOLANGIOPANCREATOGRAPHY (ERCP) WITH PROPOFOL ;  Surgeon: Wilhelmenia Aloha Raddle., MD;  Location: [facility];  Service: Gastroenterology;  Laterality: N/A;   ERCP N/A 04/28/2023   Procedure: ENDOSCOPIC RETROGRADE CHOLANGIOPANCREATOGRAPHY (ERCP);  Surgeon: Aneita Gwendlyn DASEN, MD;  Location: [facility]  ENDOSCOPY;  Service: Gastroenterology;  Laterality: N/A;   ESOPHAGOGASTRODUODENOSCOPY N/A 12/01/2022   Procedure: ESOPHAGOGASTRODUODENOSCOPY (EGD);  Surgeon: Wilhelmenia Aloha Raddle., MD;  Location: [facility];  Service: Gastroenterology;  Laterality: N/A;   ESOPHAGOGASTRODUODENOSCOPY (EGD) WITH PROPOFOL  N/A 12/02/2021   Procedure: ESOPHAGOGASTRODUODENOSCOPY (EGD) WITH PROPOFOL ;  Surgeon: Wilhelmenia Aloha Raddle., MD;  Location: [facility];  Service: Gastroenterology;  Laterality: N/A;   ESOPHAGOGASTRODUODENOSCOPY (EGD) WITH PROPOFOL  N/A 07/09/2023   Procedure: ESOPHAGOGASTRODUODENOSCOPY (EGD) WITH PROPOFOL ;  Surgeon: Abran Norleen SAILOR, MD;  Location: [facility];  Service: Gastroenterology;  Laterality: N/A;  needs ercp scope for stent pull   FINE NEEDLE ASPIRATION N/A 12/01/2022   Procedure: FINE NEEDLE ASPIRATION (FNA) LINEAR;  Surgeon: Wilhelmenia Aloha Raddle., MD;  Location: [facility];  Service: Gastroenterology;  Laterality: N/A;   NEUROLYTIC CELIAC PLEXUS  12/01/2022   Procedure: CELIAC PLEXUS BLOCK;  Surgeon: Wilhelmenia Aloha Raddle., MD;  Location: [facility];  Service: Gastroenterology;;   PANCREATIC STENT PLACEMENT  08/12/2022   Procedure: PANCREATIC STENT PLACEMENT;  Surgeon: Wilhelmenia Aloha Raddle., MD;  Location: [facility];  Service: Gastroenterology;;   PANCREATIC STENT PLACEMENT  12/01/2022   Procedure: PANCREATIC STENT PLACEMENT;  Surgeon: Wilhelmenia Aloha Raddle., MD;  Location: [facility];  Service: Gastroenterology;;   PANCREATIC STENT PLACEMENT  02/02/2023   Procedure: PANCREATIC STENT PLACEMENT;  Surgeon: Wilhelmenia Aloha Raddle., MD;  Location: [facility];  Service: Gastroenterology;;   PANCREATIC STENT PLACEMENT  06/02/2023   Procedure: PANCREATIC STENT PLACEMENT;  Surgeon: Wilhelmenia Aloha Raddle., MD;  Location: [facility];  Service: Gastroenterology;;   REMOVAL OF STONES  12/01/2022   Procedure: REMOVAL OF STONES;  Surgeon: Wilhelmenia Aloha Raddle., MD;  Location:  [facility];  Service: Gastroenterology;;   REMOVAL OF STONES  02/02/2023   Procedure: REMOVAL OF STONES;  Surgeon: Wilhelmenia Aloha Raddle., MD;  Location: [facility];  Service: Gastroenterology;;   REMOVAL OF STONES  06/02/2023   Procedure: REMOVAL OF STONES;  Surgeon: Wilhelmenia Aloha Raddle., MD;  Location: [facility];  Service: Gastroenterology;;   ANNETT  08/12/2022   Procedure: ANNETT;  Surgeon: Wilhelmenia Aloha Raddle., MD;  Location: [facility];  Service: Gastroenterology;;   ANNETT  12/01/2022   Procedure: ANNETT;  Surgeon: Wilhelmenia Aloha Raddle., MD;  Location: [facility];  Service: Gastroenterology;;   ANNETT  06/02/2023   Procedure: ANNETT;  Surgeon: Wilhelmenia Aloha Raddle., MD;  Location: [facility];  Service: Gastroenterology;;   CLEDA REMOVAL  12/01/2022   Procedure: STENT REMOVAL;  Surgeon: Wilhelmenia Aloha Raddle., MD;  Location: [facility];  Service: Gastroenterology;;   CLEDA REMOVAL  02/02/2023   Procedure: STENT REMOVAL;  Surgeon: Wilhelmenia Aloha Raddle., MD;  Location: [facility];  Service: Gastroenterology;;   CLEDA REMOVAL  06/02/2023   Procedure: STENT REMOVAL;  Surgeon: Wilhelmenia Aloha Raddle., MD;  Location: [facility];  Service: Gastroenterology;;   CLEDA REMOVAL  07/09/2023   Procedure: STENT REMOVAL;  Surgeon: Abran Norleen SAILOR, MD;  Location: [facility];  Service: Gastroenterology;;   UPPER ESOPHAGEAL ENDOSCOPIC ULTRASOUND (EUS) N/A 12/02/2021   Procedure: UPPER ESOPHAGEAL ENDOSCOPIC ULTRASOUND (EUS);  Surgeon: Wilhelmenia Aloha Raddle., MD;  Location: [facility];  Service: Gastroenterology;  Laterality: N/A;   UPPER ESOPHAGEAL ENDOSCOPIC ULTRASOUND (EUS) N/A 12/01/2022   Procedure: UPPER ESOPHAGEAL ENDOSCOPIC ULTRASOUND (EUS);  Surgeon: Wilhelmenia Aloha Raddle., MD;  Location: [facility];  Service: Gastroenterology;  Laterality: N/A;   Social History   Socioeconomic History   Marital status: Single     Spouse name: Not on file   Number of children: 6   Years of education: Not on file   Highest education level: Bachelor's degree (e.g., BA, AB, BS)  Occupational History   Occupation: museum/gallery exhibitions officer  Tobacco Use   Smoking status: Some Days    Types: Cigars    Start date: 11/08/2002   Smokeless tobacco: Never   Tobacco comments:    Once a week  Vaping Use   Vaping status: Never Used  Substance and Sexual Activity   Alcohol use: Not Currently   Drug use: Never   Sexual activity: Not on file  Other Topics Concern   Not on file  Social History Narrative   Not on file   Social Drivers of Health   Financial Resource Strain: Low Risk  (11/06/2023)   Overall Financial Resource Strain (CARDIA)    Difficulty of Paying Living Expenses: Not hard at all  Food Insecurity: No Food Insecurity (11/06/2023)   Hunger Vital Sign    Worried About Running Out of Food in the Last Year: Never true    Ran Out of Food in the Last Year: Never true  Transportation Needs: No Transportation Needs (11/06/2023)   PRAPARE - Administrator, Civil Service (Medical): No    Lack of Transportation (Non-Medical): No  Physical Activity: Sufficiently Active (11/06/2023)   Exercise Vital Sign    Days of Exercise per Week: 4 days    Minutes of Exercise per Session: 60 min  Stress: No Stress Concern Present (11/06/2023)   [employer] of Occupational Health - Occupational Stress Questionnaire    Feeling of Stress : Only a little  Social Connections: Unknown (11/06/2023)   Social Connection and Isolation Panel [NHANES]    Frequency of Communication with Friends and Family: More than three times a week    Frequency of Social Gatherings with Friends and Family: Twice a week    Attends Religious Services: Patient declined    Database Administrator or Organizations: No    Attends Engineer, Structural: Not on file    Marital Status: Married  Catering Manager Violence: Not At Risk  (08/26/2023)   Humiliation, Afraid, Rape, and Kick questionnaire    Fear of Current or Ex-Partner: No    Emotionally Abused: No    Physically Abused: No    Sexually Abused: No   Family History  Problem Relation Age of Onset   Hypertension Mother    Alcoholism Maternal Uncle    Diabetes Maternal Grandmother    Diabetes Maternal Grandfather    Diabetes Paternal Grandmother    Diabetes Paternal Grandfather    Colon cancer Neg Hx    Esophageal cancer Neg Hx    Inflammatory bowel disease Neg Hx    Liver disease Neg Hx    Pancreatic cancer Neg Hx    Rectal cancer Neg Hx    Stomach cancer Neg Hx    I have reviewed his medical, social, and family history in detail and updated the electronic medical record as necessary.    PHYSICAL EXAMINATION  BP 122/70 (BP Location: Left Arm, Patient Position: Sitting, Cuff Size: Normal)   Pulse (!) 112   Ht 6' 2.75 (1.899 m)   Wt 164 lb (74.4 kg)   BMI 20.64 kg/m  Wt Readings from  Last 3 Encounters:  11/10/23 164 lb (74.4 kg)  11/07/23 160 lb (72.6 kg)  09/19/23 163 lb (73.9 kg)  GEN: NAD, appears stated age, doesn't appear chronically ill PSYCH: Cooperative, without pressured speech EYE: Conjunctivae pink, sclerae anicteric ENT: MMM CV: Nontachycardic RESP: No audible wheezing GI: NABS, soft, significant TTP in mid epigastrium, without rebound or guarding, MSK/EXT: No lower extremity edema SKIN: No jaundice NEURO:  Alert & Oriented x 3, no focal deficits   REVIEW OF DATA  I reviewed the following data at the time of this encounter:  GI Procedures and Studies  August 2024 EGD (Dr. Abran) 1. Selective removal of pancreatic duct stent 2. Biliary stent remains in place 3. Otherwise unremarkable exam with side-viewing endoscope.  July 2020 for ERCP - Prior biliary sphincterotomy appeared open. Biliary stent was in position. This was removed and sent for cytology. - Prior pancreatic sphincterotomy appeared open. 2 pancreatic stents were  in position. These were removed. - A single moderate biliary stricture was found in the lower third of the main bile duct. The stricture was inflammatory in appearance. This was brushed for cytology (previous brushings were negative). - The upper third of the main bile duct and middle third of the main bile duct were mildly dilated, likely due to the inflammatory appearing stricture noted above. - The biliary tree was swept and sludge was found. The gallbladder began to fill on cholangiogram. - Biliary sphincterotomy extension was performed. - One plastic biliary stent was placed into the common bile duct. Had difficulty placing 2 separate biliary stents so just the left one in place. - A filling defect consistent with sludge was seen on the ventral pancreatogram. The pancreatic duct was swept with findings of multiple pancreatic stones. Further pancreatogram did not suggest significant stricturing still present. - One temporary plastic pancreatic stent was placed into the ventral pancreatic duct to decrease post ERCP pancreatitis risk.  Laboratory Studies  Reviewed those in epic  Imaging Studies  October 2024 CTAP IMPRESSION: Multiple small bowel intussusceptions noted, mostly in the right abdomen. These are of un etiology. No evidence of bowel obstruction. Small bowel wall appears mildly edematous which could be the cause of the intussusceptions or resulting from the intussusceptions. Cholelithiasis. Chronic portal vein thrombosis with cavernous transformation. Hepatic steatosis.   ASSESSMENT  Mr. Scarano is a 38 y.o. 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  The patient is seen today for evaluation and management of:  1. Chronic pancreatitis, unspecified pancreatitis type (HCC)   2. Biliary stricture   3. History of biliary duct stent placement   4. Intussusception (HCC)   5. Calculus of gallbladder without cholecystitis without obstruction    The patient is hemodynamically stable.  Clinically he is stable as well at this time.  Unfortunately his recurrent pancreatitis issues at this point, without significant progression of his pancreas duct dilation, suggest that this is a result of his scarring and not from stricturing.  Certainly something could change in the future.  With that being said, his biliary narrowing is concerning in the sense that he could redevelop jaundice if no biliary stent is in place.  I had difficulty placing 2 side-by-side plastic stents at last procedure.  If he still has evidence  of a biliary stricture on his follow-up ERCP, that we are going to work on scheduling at this time, then he may require metal stenting for a longer period of time to see if that will help this inflammatory narrowing (previous negative EUS and cytology).  If there is no evidence of a biliary stricture and follow-up ERCP, we will leave him without a stent and will monitor his liver tests.  The risks of an ERCP were discussed at length, including but not limited to the risk of perforation, bleeding, abdominal pain, post-ERCP pancreatitis (while usually mild can be severe and even life threatening).  The risks and benefits of endoscopic evaluation were discussed with the patient; these include but are not limited to the risk of perforation, infection, bleeding, missed lesions, lack of diagnosis, severe illness requiring hospitalization, as well as anesthesia and sedation related illnesses.  The patient and/or family is agreeable to proceed.  I would like an updated CT scan to ensure there are no other changes that have occurred since his last  imaging study 2-1/2 months ago.  He will continue to follow-up with pain management for his chronic pain syndrome.  It is not clear to me that his chronic pancreatitis as a result is known now cholelithiasis, though we could consider surgical referral to discuss whether that may be something to consider in the future.  For now we are going to hold on that.  I do not believe that he is a candidate at this time for a Peustow or Frey procedure as his pancreatic duct is not significantly dilated and we have been able to open things up.  Unfortunately, he is going to have chronic pain.  He has never shown any drug seeking behavior to me.  Unfortunately his disease is such that he will have to deal with this potentially for the rest of his life.   All patient questions were answered to the best of my ability, and the patient agrees to the aforementioned plan of action with follow-up as indicated.   PLAN  Proceed with scheduling ERCP for biliary stent removal and biliary evaluation to consider further stenting versus nonstenting Continue Creon   -72,000 units with each meal and 36,000 units with each snack Proceed with ordering CT abdomen with IV and oral contrast for follow-up of previous pancreatitis and previous intussusception Continue complete alcohol abstinence Continue to try to have a complete minimization of tobacco use  Continue opioid therapy as per pain management Query referral in future for auto islet cell transplant and total pancreatectomy Query referral in future for discussion of role of cholecystectomy for known cholelithiasis   Orders Placed This Encounter  Procedures   Procedural/ Surgical Case Request: ENDOSCOPIC RETROGRADE CHOLANGIOPANCREATOGRAPHY (ERCP) WITH PROPOFOL    Comp Met (CMET)   Ambulatory referral to Gastroenterology    New Prescriptions   No medications on file   Modified Medications   No medications on file    Planned Follow Up No follow-ups on  file.   Total Time in Face-to-Face and in Coordination of Care for patient including independent/personal interpretation/review of prior testing, medical history, examination, medication adjustment, communicating results with the patient directly, reviewing his imaging, and documentation within the EHR is 30 minutes.   Aloha Finner, MD [facility] Office # [phone]

## 2023-11-11 ENCOUNTER — Encounter: Payer: Self-pay | Admitting: Gastroenterology

## 2023-11-11 DIAGNOSIS — K831 Obstruction of bile duct: Secondary | ICD-10-CM | POA: Insufficient documentation

## 2023-11-11 DIAGNOSIS — K8020 Calculus of gallbladder without cholecystitis without obstruction: Secondary | ICD-10-CM | POA: Insufficient documentation

## 2023-12-08 ENCOUNTER — Other Ambulatory Visit (HOSPITAL_BASED_OUTPATIENT_CLINIC_OR_DEPARTMENT_OTHER): Payer: Self-pay

## 2023-12-08 MED ORDER — HYDROMORPHONE HCL 4 MG PO TABS
4.0000 mg | ORAL_TABLET | ORAL | 0 refills | Status: DC | PRN
  Filled 2023-12-08: qty 180, 30d supply, fill #0

## 2023-12-30 ENCOUNTER — Encounter (HOSPITAL_COMMUNITY): Payer: Self-pay | Admitting: Gastroenterology

## 2024-01-05 ENCOUNTER — Ambulatory Visit (HOSPITAL_COMMUNITY): Payer: Medicaid Other | Admitting: Anesthesiology

## 2024-01-05 ENCOUNTER — Telehealth: Payer: Self-pay

## 2024-01-05 ENCOUNTER — Other Ambulatory Visit: Payer: Self-pay

## 2024-01-05 ENCOUNTER — Encounter (HOSPITAL_COMMUNITY)
Admission: RE | Disposition: A | Discharge status: Home / Self Care | Payer: Self-pay | Source: Home / Self Care | Attending: Gastroenterology

## 2024-01-05 ENCOUNTER — Ambulatory Visit (HOSPITAL_COMMUNITY): Payer: Medicaid Other

## 2024-01-05 ENCOUNTER — Ambulatory Visit (HOSPITAL_COMMUNITY)
Admission: RE | Admit: 2024-01-05 | Discharge: 2024-01-05 | Disposition: A | Discharge status: Home / Self Care | Payer: Medicaid Other | Attending: Gastroenterology | Admitting: Gastroenterology

## 2024-01-05 ENCOUNTER — Encounter (HOSPITAL_COMMUNITY): Payer: Self-pay | Admitting: Gastroenterology

## 2024-01-05 ENCOUNTER — Ambulatory Visit (HOSPITAL_BASED_OUTPATIENT_CLINIC_OR_DEPARTMENT_OTHER): Payer: Medicaid Other | Admitting: Anesthesiology

## 2024-01-05 DIAGNOSIS — F17210 Nicotine dependence, cigarettes, uncomplicated: Secondary | ICD-10-CM | POA: Diagnosis not present

## 2024-01-05 DIAGNOSIS — K861 Other chronic pancreatitis: Secondary | ICD-10-CM | POA: Insufficient documentation

## 2024-01-05 DIAGNOSIS — K838 Other specified diseases of biliary tract: Secondary | ICD-10-CM

## 2024-01-05 DIAGNOSIS — E119 Type 2 diabetes mellitus without complications: Secondary | ICD-10-CM | POA: Diagnosis not present

## 2024-01-05 DIAGNOSIS — K219 Gastro-esophageal reflux disease without esophagitis: Secondary | ICD-10-CM | POA: Diagnosis not present

## 2024-01-05 DIAGNOSIS — Z794 Long term (current) use of insulin: Secondary | ICD-10-CM | POA: Diagnosis not present

## 2024-01-05 DIAGNOSIS — Z9689 Presence of other specified functional implants: Secondary | ICD-10-CM | POA: Diagnosis not present

## 2024-01-05 DIAGNOSIS — Z79899 Other long term (current) drug therapy: Secondary | ICD-10-CM | POA: Diagnosis not present

## 2024-01-05 DIAGNOSIS — K831 Obstruction of bile duct: Secondary | ICD-10-CM

## 2024-01-05 DIAGNOSIS — Z9889 Other specified postprocedural states: Secondary | ICD-10-CM

## 2024-01-05 DIAGNOSIS — Z4659 Encounter for fitting and adjustment of other gastrointestinal appliance and device: Secondary | ICD-10-CM | POA: Diagnosis not present

## 2024-01-05 HISTORY — PX: STENT REMOVAL: SHX6421

## 2024-01-05 HISTORY — PX: BILIARY DILATION: SHX6850

## 2024-01-05 HISTORY — PX: BILIARY BRUSHING: SHX6843

## 2024-01-05 HISTORY — PX: ENDOSCOPIC RETROGRADE CHOLANGIOPANCREATOGRAPHY (ERCP) WITH PROPOFOL: SHX5810

## 2024-01-05 HISTORY — PX: REMOVAL OF STONES: SHX5545

## 2024-01-05 LAB — GLUCOSE, CAPILLARY
Glucose-Capillary: 115 mg/dL — ABNORMAL HIGH (ref 70–99)
Glucose-Capillary: 98 mg/dL (ref 70–99)

## 2024-01-05 SURGERY — ENDOSCOPIC RETROGRADE CHOLANGIOPANCREATOGRAPHY (ERCP) WITH PROPOFOL
Anesthesia: General

## 2024-01-05 MED ORDER — MIDAZOLAM HCL 2 MG/2ML IJ SOLN
INTRAMUSCULAR | Status: AC
  Filled 2024-01-05: qty 2

## 2024-01-05 MED ORDER — HYDROMORPHONE HCL 1 MG/ML IJ SOLN
INTRAMUSCULAR | Status: AC
  Filled 2024-01-05: qty 1

## 2024-01-05 MED ORDER — PROPOFOL 1000 MG/100ML IV EMUL
INTRAVENOUS | Status: AC
  Filled 2024-01-05: qty 100

## 2024-01-05 MED ORDER — MIDAZOLAM HCL 5 MG/5ML IJ SOLN
INTRAMUSCULAR | Status: DC | PRN
  Administered 2024-01-05: 2 mg via INTRAVENOUS

## 2024-01-05 MED ORDER — FENTANYL CITRATE (PF) 100 MCG/2ML IJ SOLN
INTRAMUSCULAR | Status: AC
  Filled 2024-01-05: qty 2

## 2024-01-05 MED ORDER — LIDOCAINE HCL (CARDIAC) PF 100 MG/5ML IV SOSY
PREFILLED_SYRINGE | INTRAVENOUS | Status: DC | PRN
  Administered 2024-01-05: 80 mg via INTRAVENOUS

## 2024-01-05 MED ORDER — PROPOFOL 10 MG/ML IV BOLUS
INTRAVENOUS | Status: DC | PRN
  Administered 2024-01-05: 180 ug/kg/min via INTRAVENOUS
  Administered 2024-01-05: 180 mg via INTRAVENOUS

## 2024-01-05 MED ORDER — DICLOFENAC SUPPOSITORY 100 MG
RECTAL | Status: DC | PRN
  Administered 2024-01-05: 100 mg via RECTAL

## 2024-01-05 MED ORDER — HYDROMORPHONE HCL 1 MG/ML IJ SOLN
0.5000 mg | INTRAMUSCULAR | Status: AC
  Administered 2024-01-05: 0.5 mg via INTRAVENOUS

## 2024-01-05 MED ORDER — ALBUMIN HUMAN 5 % IV SOLN: INTRAVENOUS | Status: DC | PRN

## 2024-01-05 MED ORDER — GLUCAGON HCL RDNA (DIAGNOSTIC) 1 MG IJ SOLR
INTRAMUSCULAR | Status: AC
  Filled 2024-01-05: qty 1

## 2024-01-05 MED ORDER — SUCCINYLCHOLINE CHLORIDE 200 MG/10ML IV SOSY
PREFILLED_SYRINGE | INTRAVENOUS | Status: DC | PRN
  Administered 2024-01-05: 180 mg via INTRAVENOUS

## 2024-01-05 MED ORDER — SODIUM CHLORIDE 0.9 % IV SOLN
INTRAVENOUS | Status: DC
Start: 2024-01-05 — End: 2024-01-05

## 2024-01-05 MED ORDER — ALBUMIN HUMAN 5 % IV SOLN
INTRAVENOUS | Status: AC
  Filled 2024-01-05: qty 500

## 2024-01-05 MED ORDER — ESMOLOL HCL 100 MG/10ML IV SOLN
INTRAVENOUS | Status: DC | PRN
  Administered 2024-01-05: 10 mg via INTRAVENOUS
  Administered 2024-01-05: 20 mg via INTRAVENOUS

## 2024-01-05 MED ORDER — AMPICILLIN-SULBACTAM SODIUM 1.5 (1-0.5) G IJ SOLR
1.5000 g | Freq: Once | INTRAMUSCULAR | Status: AC
  Administered 2024-01-05: 1.5 g via INTRAVENOUS
  Filled 2024-01-05: qty 4

## 2024-01-05 MED ORDER — FENTANYL CITRATE (PF) 100 MCG/2ML IJ SOLN
INTRAMUSCULAR | Status: AC
Start: 2024-01-05 — End: ?
  Filled 2024-01-05: qty 2

## 2024-01-05 MED ORDER — DICLOFENAC SUPPOSITORY 100 MG
RECTAL | Status: AC
  Filled 2024-01-05: qty 1

## 2024-01-05 MED ORDER — FENTANYL CITRATE (PF) 100 MCG/2ML IJ SOLN
INTRAMUSCULAR | Status: DC | PRN
  Administered 2024-01-05 (×2): 100 ug via INTRAVENOUS

## 2024-01-05 MED ORDER — PROPOFOL 1000 MG/100ML IV EMUL
INTRAVENOUS | Status: AC
  Filled 2024-01-05: qty 200

## 2024-01-05 MED ORDER — GLUCAGON HCL RDNA (DIAGNOSTIC) 1 MG IJ SOLR
INTRAMUSCULAR | Status: DC | PRN
  Administered 2024-01-05: .25 mL via INTRAVENOUS

## 2024-01-05 MED ORDER — DEXMEDETOMIDINE HCL IN NACL 80 MCG/20ML IV SOLN
INTRAVENOUS | Status: DC | PRN
Start: 2024-01-05 — End: 2024-01-05
  Administered 2024-01-05 (×2): 4 ug via INTRAVENOUS
  Administered 2024-01-05 (×2): 8 ug via INTRAVENOUS

## 2024-01-05 MED ORDER — DEXMEDETOMIDINE HCL IN NACL 80 MCG/20ML IV SOLN
INTRAVENOUS | Status: AC
  Filled 2024-01-05: qty 20

## 2024-01-05 MED ORDER — PROPOFOL 10 MG/ML IV BOLUS: INTRAVENOUS | Status: DC | PRN

## 2024-01-05 MED ORDER — PROPOFOL 500 MG/50ML IV EMUL
INTRAVENOUS | Status: AC
  Filled 2024-01-05: qty 50

## 2024-01-05 MED ORDER — LACTATED RINGERS IV SOLN: INTRAVENOUS | Status: DC | PRN

## 2024-01-05 NOTE — Transfer of Care (Signed)
 Immediate Anesthesia Transfer of Care Note  Patient: Carlos [name]  Procedure(s) Performed: ENDOSCOPIC RETROGRADE CHOLANGIOPANCREATOGRAPHY (ERCP) WITH PROPOFOL STENT REMOVAL BILIARY DILATION REMOVAL OF SLUDGE BILIARY BRUSHING  Patient Location: PACU and Endoscopy Unit  Anesthesia Type:General  Level of Consciousness: awake, alert , and confused  Airway & Oxygen Therapy: Patient Spontanous Breathing and Patient connected to face mask oxygen  Post-op Assessment: Report given to RN and Post -op Vital signs reviewed and stable  Post vital signs: Reviewed and stable  Last Vitals:  Vitals Value Taken Time  BP 129/88 01/05/24 1451  Temp 36.7 C 01/05/24 1450  Pulse 94 01/05/24 1457  Resp 19 01/05/24 1457  SpO2 93 % 01/05/24 1457  Vitals shown include unfiled device data.  Last Pain:  Vitals:   01/05/24 1450  TempSrc: Temporal  PainSc: 4          Complications: No notable events documented.

## 2024-01-05 NOTE — Telephone Encounter (Signed)
 Order has been entered and sent to the schedulers   03/02/24 at 1050 am appt made with Dr [name]  Left message on machine to call back

## 2024-01-05 NOTE — Anesthesia Preprocedure Evaluation (Addendum)
 Anesthesia Evaluation  Patient identified by MRN, date of birth, ID band Patient awake    Reviewed: Allergy & Precautions, NPO status , Patient's Chart, lab work & pertinent test results  History of Anesthesia Complications Negative for: history of anesthetic complications  Airway Mallampati: I  TM Distance: >3 FB Neck ROM: Full    Dental no notable dental hx. (+) Dental Advisory Given   Pulmonary Current Smoker and Patient abstained from smoking.   Pulmonary exam normal breath sounds clear to auscultation       Cardiovascular negative cardio ROS Normal cardiovascular exam Rhythm:Regular Rate:Normal     Neuro/Psych  PSYCHIATRIC DISORDERS Anxiety     negative neurological ROS     GI/Hepatic ,GERD  Medicated,,(+)     substance abuse  alcohol useHx of Pancreatitis Hx of Biliary Stent Biliary Stricture   Endo/Other  diabetes, Well Controlled, Type 2, Insulin Dependent   Na 129 (baseline low 130's)    Renal/GU Renal disease  negative genitourinary   Musculoskeletal negative musculoskeletal ROS (+)    Abdominal   Peds  Hematology  (+) Blood dyscrasia, anemia   Anesthesia Other Findings   Reproductive/Obstetrics                             Anesthesia Physical Anesthesia Plan  ASA: 3  Anesthesia Plan: General   Post-op Pain Management: Dilaudid IV   Induction: Intravenous and Cricoid pressure planned  PONV Risk Score and Plan: 3 and Ondansetron, Midazolam, TIVA and Treatment may vary due to age or medical condition  Airway Management Planned: Oral ETT  Additional Equipment: None  Intra-op Plan:   Post-operative Plan: Extubation in OR  Informed Consent: I have reviewed the patients History and Physical, chart, labs and discussed the procedure including the risks, benefits and alternatives for the proposed anesthesia with the patient or authorized representative who has  indicated his/her understanding and acceptance.     Dental advisory given  Plan Discussed with: CRNA and Anesthesiologist  Anesthesia Plan Comments:        Anesthesia Quick Evaluation

## 2024-01-05 NOTE — Anesthesia Procedure Notes (Signed)
 Procedure Name: Intubation Date/Time: 01/05/2024 1:48 PM  Performed by: Floydene Flock, CRNAPre-anesthesia Checklist: Patient identified, Emergency Drugs available, Suction available and Patient being monitored Patient Re-evaluated:Patient Re-evaluated prior to induction Oxygen Delivery Method: Circle system utilized Preoxygenation: Pre-oxygenation with 100% oxygen Induction Type: IV induction Laryngoscope Size: Mac and 4 Grade View: Grade I Tube type: Oral Tube size: 7.5 mm Number of attempts: 1 Airway Equipment and Method: Stylet Placement Confirmation: ETT inserted through vocal cords under direct vision, positive ETCO2 and breath sounds checked- equal and bilateral Secured at: 22 cm Tube secured with: Tape Dental Injury: Teeth and Oropharynx as per pre-operative assessment

## 2024-01-05 NOTE — Telephone Encounter (Signed)
 Error voice mail has not been set up will attempt later

## 2024-01-05 NOTE — Anesthesia Postprocedure Evaluation (Signed)
 Anesthesia Post Note  Patient: Carlos [name]  Procedure(s) Performed: ENDOSCOPIC RETROGRADE CHOLANGIOPANCREATOGRAPHY (ERCP) WITH PROPOFOL STENT REMOVAL BILIARY DILATION REMOVAL OF SLUDGE BILIARY BRUSHING     Patient location during evaluation: PACU Anesthesia Type: General Level of consciousness: awake and alert and oriented Pain management: pain level controlled Vital Signs Assessment: post-procedure vital signs reviewed and stable Respiratory status: spontaneous breathing, nonlabored ventilation and respiratory function stable Cardiovascular status: blood pressure returned to baseline and stable Postop Assessment: no apparent nausea or vomiting Anesthetic complications: no   No notable events documented.  Last Vitals:  Vitals:   01/05/24 1510 01/05/24 1520  BP: 109/70 109/80  Pulse: 90 90  Resp: 20 16  Temp:    SpO2: 95% 95%    Last Pain:  Vitals:   01/05/24 1520  TempSrc:   PainSc: 7                  [name] A.

## 2024-01-05 NOTE — Discharge Instructions (Signed)

## 2024-01-05 NOTE — H&P (Addendum)
 GASTROENTEROLOGY PROCEDURE H&P NOTE   Primary Care Physician: Caesar Bookman, NP  HPI: Carlos [name] is a 38 y.o. male who presents for ERCP for biliary stent interrogation in setting of previous inflammatory stricture and obstruction s/p prior ERCPs for chronic pancreatitis.  Past Medical History:  Diagnosis Date   AKI (acute kidney injury) (HCC) 12/21/2021   Anxiety    Diabetes (HCC)    Pancreatitis    Splenic vein thrombosis    Past Surgical History:  Procedure Laterality Date   BILIARY BRUSHING  02/02/2023   Procedure: BILIARY BRUSHING;  Surgeon: [name] [name], MD;  Location: [facility];  Service: Gastroenterology;;   BILIARY BRUSHING  04/28/2023   Procedure: BILIARY BRUSHING;  Surgeon: Meryl Dare, MD;  Location: [facility] END[facility]COPY;  Service: Gastroenterology;;   BILIARY BRUSHING  06/02/2023   Procedure: BILIARY BRUSHING;  Surgeon: Lemar Lofty., MD;  Location: [facility];  Service: Gastroenterology;;   BILIARY DILATION  12/01/2022   Procedure: BILIARY DILATION;  Surgeon: Lemar Lofty., MD;  Location: [facility];  Service: Gastroenterology;;   BILIARY DILATION  02/02/2023   Procedure: BILIARY DILATION;  Surgeon: Lemar Lofty., MD;  Location: [facility];  Service: Gastroenterology;;   BILIARY STENT PLACEMENT  04/28/2023   Procedure: BILIARY STENT PLACEMENT;  Surgeon: Meryl Dare, MD;  Location: [facility] ENDOSCOPY;  Service: Gastroenterology;;   BILIARY STENT PLACEMENT N/A 06/02/2023   Procedure: BILIARY STENT PLACEMENT;  Surgeon: Lemar Lofty., MD;  Location: [facility] ENDOSCOPY;  Service: Gastroenterology;  Laterality: N/A;   BIOPSY  12/02/2021   Procedure: BIOPSY;  Surgeon: [name] [name], MD;  Location: [facility];  Service: Gastroenterology;;   ENDOSCOPIC RETROGRADE CHOLANGIOPANCREATOGRAPHY (ERCP) WITH PROPOFOL N/A 08/12/2022   Procedure: ENDOSCOPIC RETROGRADE CHOLANGIOPANCREATOGRAPHY (ERCP) WITH PROPOFOL;   Surgeon: Lemar Lofty., MD;  Location: [facility];  Service: Gastroenterology;  Laterality: N/A;   ENDOSCOPIC RETROGRADE CHOLANGIOPANCREATOGRAPHY (ERCP) WITH PROPOFOL N/A 12/01/2022   Procedure: ENDOSCOPIC RETROGRADE CHOLANGIOPANCREATOGRAPHY (ERCP) WITH PROPOFOL;  Surgeon: [name] [name], MD;  Location: [facility];  Service: Gastroenterology;  Laterality: N/A;   ENDOSCOPIC RETROGRADE CHOLANGIOPANCREATOGRAPHY (ERCP) WITH PROPOFOL N/A 02/02/2023   Procedure: ENDOSCOPIC RETROGRADE CHOLANGIOPANCREATOGRAPHY (ERCP) WITH PROPOFOL;  Surgeon: [name] [name], MD;  Location: [facility];  Service: Gastroenterology;  Laterality: N/A;   ENDOSCOPIC RETROGRADE CHOLANGIOPANCREATOGRAPHY (ERCP) WITH PROPOFOL N/A 06/02/2023   Procedure: ENDOSCOPIC RETROGRADE CHOLANGIOPANCREATOGRAPHY (ERCP) WITH PROPOFOL;  Surgeon: [name] [name], MD;  Location: [facility];  Service: Gastroenterology;  Laterality: N/A;   ERCP N/A 04/28/2023   Procedure: ENDOSCOPIC RETROGRADE CHOLANGIOPANCREATOGRAPHY (ERCP);  Surgeon: Meryl Dare, MD;  Location: [facility] ENDOSCOPY;  Service: Gastroenterology;  Laterality: N/A;   ESOPHAGOGASTRODUODENOSCOPY N/A 12/01/2022   Procedure: ESOPHAGOGASTRODUODENOSCOPY (EGD);  Surgeon: Lemar Lofty., MD;  Location: [facility];  Service: Gastroenterology;  Laterality: N/A;   ESOPHAGOGASTRODUODENOSCOPY (EGD) WITH PROPOFOL N/A 12/02/2021   Procedure: ESOPHAGOGASTRODUODENOSCOPY (EGD) WITH PROPOFOL;  Surgeon: [name] [name], MD;  Location: [facility];  Service: Gastroenterology;  Laterality: N/A;   ESOPHAGOGASTRODUODENOSCOPY (EGD) WITH PROPOFOL N/A 07/09/2023   Procedure: ESOPHAGOGASTRODUODENOSCOPY (EGD) WITH PROPOFOL;  Surgeon: Hilarie Fredrickson, MD;  Location: [facility];  Service: Gastroenterology;  Laterality: N/A;  needs ercp scope for stent pull   FINE NEEDLE ASPIRATION N/A 12/01/2022   Procedure: FINE NEEDLE ASPIRATION (FNA) LINEAR;  Surgeon:  Lemar Lofty., MD;  Location: [facility];  Service: Gastroenterology;  Laterality: N/A;   NEUROLYTIC CELIAC PLEXUS  12/01/2022   Procedure: CELIAC PLEXUS BLOCK;  Surgeon: [name] [name], MD;  Location: [facility];  Service: Gastroenterology;;   PANCREATIC STENT PLACEMENT  08/12/2022   Procedure: PANCREATIC STENT PLACEMENT;  Surgeon: [name] [name], MD;  Location: [facility];  Service: Gastroenterology;;   PANCREATIC STENT PLACEMENT  12/01/2022   Procedure: PANCREATIC STENT PLACEMENT;  Surgeon: Lemar Lofty., MD;  Location: [facility];  Service: Gastroenterology;;   PANCREATIC STENT PLACEMENT  02/02/2023   Procedure: PANCREATIC STENT PLACEMENT;  Surgeon: Lemar Lofty., MD;  Location: [facility];  Service: Gastroenterology;;   PANCREATIC STENT PLACEMENT  06/02/2023   Procedure: PANCREATIC STENT PLACEMENT;  Surgeon: Lemar Lofty., MD;  Location: [facility];  Service: Gastroenterology;;   REMOVAL OF STONES  12/01/2022   Procedure: REMOVAL OF STONES;  Surgeon: Lemar Lofty., MD;  Location: [facility];  Service: Gastroenterology;;   REMOVAL OF STONES  02/02/2023   Procedure: REMOVAL OF STONES;  Surgeon: Lemar Lofty., MD;  Location: [facility];  Service: Gastroenterology;;   REMOVAL OF STONES  06/02/2023   Procedure: REMOVAL OF STONES;  Surgeon: Lemar Lofty., MD;  Location: [facility];  Service: Gastroenterology;;   [name]  08/12/2022   Procedure: [name];  Surgeon: Lemar Lofty., MD;  Location: [facility];  Service: Gastroenterology;;   [name]  12/01/2022   Procedure: [name];  Surgeon: Lemar Lofty., MD;  Location: [facility];  Service: Gastroenterology;;   [name]  06/02/2023   Procedure: [name];  Surgeon: Lemar Lofty., MD;  Location: [facility];  Service: Gastroenterology;;   Francine Graven REMOVAL  12/01/2022    Procedure: STENT REMOVAL;  Surgeon: Lemar Lofty., MD;  Location: [facility];  Service: Gastroenterology;;   Francine Graven REMOVAL  02/02/2023   Procedure: STENT REMOVAL;  Surgeon: Lemar Lofty., MD;  Location: [facility];  Service: Gastroenterology;;   Francine Graven REMOVAL  06/02/2023   Procedure: STENT REMOVAL;  Surgeon: Lemar Lofty., MD;  Location: [facility];  Service: Gastroenterology;;   Francine Graven REMOVAL  07/09/2023   Procedure: STENT REMOVAL;  Surgeon: Hilarie Fredrickson, MD;  Location: [facility];  Service: Gastroenterology;;   UPPER ESOPHAGEAL ENDOSCOPIC ULTRASOUND (EUS) N/A 12/02/2021   Procedure: UPPER ESOPHAGEAL ENDOSCOPIC ULTRASOUND (EUS);  Surgeon: Lemar Lofty., MD;  Location: [facility];  Service: Gastroenterology;  Laterality: N/A;   UPPER ESOPHAGEAL ENDOSCOPIC ULTRASOUND (EUS) N/A 12/01/2022   Procedure: UPPER ESOPHAGEAL ENDOSCOPIC ULTRASOUND (EUS);  Surgeon: Lemar Lofty., MD;  Location: [facility];  Service: Gastroenterology;  Laterality: N/A;   Current Facility-Administered Medications  Medication Dose Route Frequency Provider Last Rate Last Admin   0.9 %  sodium chloride infusion   Intravenous Continuous [name], [name], MD       ampicillin-sulbactam (UNASYN) 1.5 g in sodium chloride 0.9 % 100 mL IVPB  1.5 g Intravenous Once [name], [name], MD        Current Facility-Administered Medications:    0.9 %  sodium chloride infusion, , Intravenous, Continuous, [name], [name], MD   ampicillin-sulbactam (UNASYN) 1.5 g in sodium chloride 0.9 % 100 mL IVPB, 1.5 g, Intravenous, Once, [name], [name], MD Allergies  Allergen Reactions   Other Anaphylaxis and Other (See Comments)    [name] - anaphylaxis   Family History  Problem Relation Age of Onset   Hypertension Mother    Alcoholism Maternal Uncle    Diabetes Maternal Grandmother    Diabetes Maternal Grandfather    Diabetes Paternal Grandmother     Diabetes Paternal Grandfather    Colon cancer Neg Hx    Esophageal cancer Neg Hx  Inflammatory bowel disease Neg Hx    Liver disease Neg Hx    Pancreatic cancer Neg Hx    Rectal cancer Neg Hx    Stomach cancer Neg Hx    Social History   Socioeconomic History   Marital status: Single    Spouse name: Not on file   Number of children: 6   Years of education: Not on file   Highest education level: Bachelor's degree (e.g., BA, AB, BS)  Occupational History   Occupation: Museum/gallery exhibitions officer  Tobacco Use   Smoking status: Some Days    Types: Cigars    Start date: 11/08/2002   Smokeless tobacco: Never   Tobacco comments:    Once a week  Vaping Use   Vaping status: Never Used  Substance and Sexual Activity   Alcohol use: Not Currently   Drug use: Never   Sexual activity: Not on file  Other Topics Concern   Not on file  Social History Narrative   Not on file   Social Drivers of Health   Financial Resource Strain: Low Risk  (11/06/2023)   Overall Financial Resource Strain (CARDIA)    Difficulty of Paying Living Expenses: Not hard at all  Food Insecurity: No Food Insecurity (11/06/2023)   Hunger Vital Sign    Worried About Running Out of Food in the Last Year: Never true    Ran Out of Food in the Last Year: Never true  Transportation Needs: No Transportation Needs (11/06/2023)   PRAPARE - Administrator, Civil Service (Medical): No    Lack of Transportation (Non-Medical): No  Physical Activity: Sufficiently Active (11/06/2023)   Exercise Vital Sign    Days of Exercise per Week: 4 days    Minutes of Exercise per Session: 60 min  Stress: No Stress Concern Present (11/06/2023)   [employer] of Occupational Health - Occupational Stress Questionnaire    Feeling of Stress : Only a little  Social Connections: Unknown (11/06/2023)   Social Connection and Isolation Panel [NHANES]    Frequency of Communication with Friends and Family: More than three times a week     Frequency of Social Gatherings with Friends and Family: Twice a week    Attends Religious Services: Patient declined    Database administrator or Organizations: No    Attends Engineer, structural: Not on file    Marital Status: Married  Catering manager Violence: Not At Risk (08/26/2023)   Humiliation, Afraid, Rape, and Kick questionnaire    Fear of Current or Ex-Partner: No    Emotionally Abused: No    Physically Abused: No    Sexually Abused: No    Physical Exam: Today's Vitals   01/05/24 1257  BP: 130/88  Pulse: 92  Resp: 20  Temp: 98 F (36.7 C)  TempSrc: Tympanic  SpO2: 100%  Weight: 79.4 kg  Height: 6\' 5"  (1.956 m)  PainSc: 7    Body mass index is 20.75 kg/m. GEN: NAD EYE: Sclerae anicteric ENT: MMM CV: Non-tachycardic GI: Soft, 7/10 pain preprocedure in Upper abdomen NEURO:  Alert & Oriented x 3  Lab Results: No results for input(s): "WBC", "HGB", "HCT", "PLT" in the last 72 hours. BMET No results for input(s): "NA", "K", "CL", "CO2", "GLUCOSE", "BUN", "CREATININE", "CALCIUM" in the last 72 hours. LFT No results for input(s): "PROT", "ALBUMIN", "AST", "ALT", "ALKPHOS", "BILITOT", "BILIDIR", "IBILI" in the last 72 hours. PT/INR No results for input(s): "LABPROT", "INR" in the last 72 hours.  Impression / Plan: This is a 38 y.o.male  who presents for ERCP for biliary stent interrogation in setting of previous inflammatory stricture and obstruction s/p prior ERCPs for chronic pancreatitis.  The risks of an ERCP were discussed at length, including but not limited to the risk of perforation, bleeding, abdominal pain, post-ERCP pancreatitis (while usually mild can be severe and even life threatening).   The risks and benefits of endoscopic evaluation/treatment were discussed with the patient and/or family; these include but are not limited to the risk of perforation, infection, bleeding, missed lesions, lack of diagnosis, severe illness requiring  hospitalization, as well as anesthesia and sedation related illnesses.  The patient's history has been reviewed, patient examined, no change in status, and deemed stable for procedure.  The patient and/or family is agreeable to proceed.    [name], MD [facility] Office # [phone]

## 2024-01-05 NOTE — Op Note (Addendum)
 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 Estimated Blood Loss:     Estimated blood loss was minimal. Procedure:                Pre-Anesthesia Assessment:                           - Prior to the procedure, a History and Physical                            was performed, and patient medications and                            allergies were reviewed. The patient's tolerance of                            previous anesthesia was also reviewed. The risks                            and benefits of the procedure and the sedation                            options and risks were discussed with the patient.                            All questions were answered, and informed consent                            was obtained. Prior Anticoagulants: The patient has  taken no anticoagulant or antiplatelet agents. ASA                            Grade Assessment: II - A patient with mild systemic                            disease. After reviewing the risks and benefits,                            the patient was deemed in satisfactory condition to                            undergo the procedure.                            After obtaining informed consent, the scope was                            passed under direct vision. Throughout the                            procedure, the patient's blood pressure, pulse, and                            oxygen saturations were monitored continuously. The                            [employer] D single use                            duodenoscope was introduced through the mouth, and                            used to inject contrast into and used to inject                            contrast into the bile duct. The ERCP was                            accomplished without difficulty. The patient                            tolerated the procedure. Scope In: Scope Out: Findings:      A biliary stent was visible on the scout film.      The esophagus was successfully intubated under direct vision without       detailed examination of the pharynx, larynx, and associated structures,       and upper GI tract. The upper GI tract was J-shaped. Inspection of the       major papilla revealed that biliary and pancreatic sphincterotomies had       been performed previously. The biliary sphincterotomy appeared open. The       pancreatic sphincterotomy appeared open. One plastic biliary stent       originating in the biliary tree was  emerging from the major papilla. The       stent was visibly patent. The stent was removed from the biliary tree       using a snare and sent for cytology.      A 0.035 inch x 260 cm straight Hydra Jagwire was passed into the biliary       tree. The Hydratome sphincterotome was passed over the guidewire and the       bile duct was then deeply cannulated. Contrast was injected. I       personally interpreted the bile duct images. Ductal flow of contrast was       adequate. Image quality was adequate. Contrast extended to the entire       biliary tree. Opacification of the entire opacified area was successful.       The maximum diameter  of the ducts was 8 mm. The very distal bile duct       contained a single mild stenosis 8 mm in length. To discover objects,       the biliary tree was swept with a retrieval balloon. Sludge was swept       from the duct. Biliary flow was good with near complete contrast       extraction. An occlusion cholangiogram was performed that showed no       further significant biliary pathology than previously noted. 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 I did decide that it may make       sense in this inflammatory appearing stenosis to do further therapy       however. Thus a dilation of the common bile duct stenosis/narrowing as a       sphincteroplasty with a hurricane 6 mm balloon dilator was successful.       Cells for cytology were obtained by brushing the very distal main bile       duct narrowing. Important to document that flow was excellent after all       of these maneuvers.      A pancreatogram was not performed.      The duodenoscope was withdrawn from the patient. Impression:               - Prior biliary sphincterotomy appeared open.                           - Prior pancreatic sphincterotomy appeared open.                           - One visibly patent stent from the biliary tree                            was seen in the major papilla. This was removed and                            sent for cytology.                           - A single  mild biliary stricture was found in the                            lower third of the main bile duct. The stricture                            was inflammatory.                           - The biliary tree was swept and sludge was found.                           - Sphincteroplasty with 6 mm HurriCaine performed                            to the distal biliary  narrowing/stenosis.                           - Cells for cytology obtained from the stenosis                            region.                           - Important to document that contrast flow was good                            even before biliary sphincteroplasty was performed. Moderate Sedation:      Not Applicable - Patient had care per Anesthesia. Recommendation:           - The patient will be observed post-procedure,                            until all discharge criteria are met.                           - Discharge patient to home.                           - Patient has a contact number available for                            emergencies. The signs and symptoms of potential                            delayed complications were discussed with the                            patient. Return to normal activities tomorrow.                            Written discharge instructions were provided to the                            patient.                           -  Low fat diet for 1 week.                           - Observe patient's clinical course.                           - Follow-up cytology.                           - Check liver enzymes (AST, ALT, alkaline                            phosphatase, bilirubin) in 2 weeks.                           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 If                            he does, then updated imaging and likely repeat                            stenting will be required (I have some concern                            about doing a fully covered self-expanding metal                            stent with him still having his gallbladder in                            place that could lead to acute  cholecystitis due to                            the insertion of where his cystic duct appears to                            be ? thus would have to consider double biliary                            stenting (though had issues with chronic placed to                            biliary stent side-by-side on last ERCP)).                           -  Will plan MRI/MRCP in 4 to 8 weeks with follow-up                            in clinic thereafter.                           - Watch for pancreatitis, bleeding, perforation,                            and cholangitis.                           - The findings and recommendations were discussed                            with the patient.                           - The findings and recommendations were discussed                            with the designated responsible adult. Procedure Code(s):        --- Professional ---                           [phone], 59, Endoscopic retrograde                            cholangiopancreatography (ERCP); with                            trans-endoscopic balloon dilation of                            biliary/pancreatic duct(s) or of ampulla                            (sphincteroplasty), including sphincterotomy, when                            performed, each duct                           43275, 59, Endoscopic retrograde                            cholangiopancreatography (ERCP); with removal of                            foreign body(s) or stent(s) from biliary/pancreatic                            duct(s)                           43264, Endoscopic retrograde  cholangiopancreatography (ERCP); with removal of                            calculi/debris from biliary/pancreatic duct(s)                           16109, 26, Endoscopic catheterization of the                            biliary ductal system, radiological supervision and                            interpretation Diagnosis  Code(s):        --- Professional ---                           Z96.89, Presence of other specified functional                            implants                           K83.1, Obstruction of bile duct                           Z46.59, Encounter for fitting and adjustment of                            other gastrointestinal appliance and device                           K86.1, Other chronic pancreatitis                           Z98.890, Other specified postprocedural states CPT copyright 2022 [employer]. All rights reserved. The codes documented in this report are preliminary and upon coder review may  be revised to meet current compliance requirements. [name], MD 01/05/2024 2:54:12 PM Number of Addenda: 0

## 2024-01-05 NOTE — Telephone Encounter (Signed)
-----   Message from [facility] sent at 01/05/2024  2:54 PM EST ----- Regarding: Followup Melane Windholz, Lets set up this patient for MRI/MRCP in the next 4 to 8 weeks. Follow-up in clinic after. If you have any other imaging orders that are pending go ahead and cancel those. Thanks. GM

## 2024-01-06 ENCOUNTER — Other Ambulatory Visit (HOSPITAL_BASED_OUTPATIENT_CLINIC_OR_DEPARTMENT_OTHER): Payer: Self-pay

## 2024-01-06 MED ORDER — HYDROMORPHONE HCL 4 MG PO TABS
4.0000 mg | ORAL_TABLET | ORAL | 0 refills | Status: DC | PRN
  Filled 2024-01-06: qty 180, 30d supply, fill #0

## 2024-01-08 ENCOUNTER — Encounter (HOSPITAL_COMMUNITY): Payer: Self-pay | Admitting: Gastroenterology

## 2024-01-09 ENCOUNTER — Other Ambulatory Visit (HOSPITAL_BASED_OUTPATIENT_CLINIC_OR_DEPARTMENT_OTHER): Payer: Self-pay

## 2024-01-09 NOTE — Telephone Encounter (Signed)
 The pt has been advised and set up for MRI as well as office visit.

## 2024-01-10 LAB — CYTOLOGY - NON PAP

## 2024-01-11 ENCOUNTER — Telehealth: Payer: Self-pay | Admitting: Gastroenterology

## 2024-01-11 NOTE — Telephone Encounter (Signed)
 Spoke with [facility] and was informed the case for his MRI has been denied for an outpatient hospital but the pt can still go to a free standing facility for full approval.  In [facility] [address] [place], [place] [address]

## 2024-01-11 NOTE — Telephone Encounter (Signed)
 Called GI and spoke with the schedulers. They will call the pt and set up MRI.  I have also called and notified the pt and provided him with the number to GI to call as well if he has not heard from them in a few days.

## 2024-01-12 ENCOUNTER — Other Ambulatory Visit (HOSPITAL_BASED_OUTPATIENT_CLINIC_OR_DEPARTMENT_OTHER): Payer: Self-pay

## 2024-01-12 ENCOUNTER — Other Ambulatory Visit: Payer: Self-pay

## 2024-01-12 MED ORDER — ONE-A-DAY MENS PO TABS
1.0000 | ORAL_TABLET | Freq: Every day | ORAL | 1 refills | Status: DC
  Filled 2024-01-12: qty 100, 100d supply, fill #0
  Filled 2024-06-11: qty 130, 130d supply, fill #0
  Filled 2024-10-07: qty 30, 30d supply, fill #1
  Filled 2024-10-07: qty 70, 70d supply, fill #1

## 2024-01-13 ENCOUNTER — Ambulatory Visit (HOSPITAL_COMMUNITY): Payer: Medicaid Other

## 2024-01-18 ENCOUNTER — Encounter: Payer: Self-pay | Admitting: Gastroenterology

## 2024-01-23 ENCOUNTER — Other Ambulatory Visit (HOSPITAL_BASED_OUTPATIENT_CLINIC_OR_DEPARTMENT_OTHER): Payer: Self-pay

## 2024-01-24 ENCOUNTER — Telehealth: Payer: Self-pay | Admitting: Gastroenterology

## 2024-01-24 ENCOUNTER — Other Ambulatory Visit: Payer: Self-pay

## 2024-01-24 DIAGNOSIS — R200 Anesthesia of skin: Secondary | ICD-10-CM

## 2024-01-24 NOTE — Telephone Encounter (Signed)
 Spoke with [facility]. They are happy to see patient. Referral will be placed. We will need to update the patient.  Patty, please let him know. GM

## 2024-01-24 NOTE — Telephone Encounter (Signed)
 Patient called to request a refill on Creon.

## 2024-01-24 NOTE — Telephone Encounter (Signed)
 Referral has been placed to neurology and pt advised.

## 2024-01-24 NOTE — Telephone Encounter (Signed)
 I agree with urgent referral to Neurologist.  Thank You.

## 2024-01-24 NOTE — Telephone Encounter (Signed)
 PT had ERCP done 2/27 and every since then his right arm continues to go numb and he is concerned. Please advise.

## 2024-01-24 NOTE — Telephone Encounter (Signed)
 I called and spoke with the patient this afternoon. He describes issues of numbness and electric sharp pains beginning in his right neck extending to his supraclavicular region extending to the back of his arm the back of his forearm and wrapping around his entire thumb.  This is affecting his grip at times and is occurring every 30 seconds or so even when he is not holding or trying to make a fist.  His other fingers and arm and forearm do not seem to be affected. He states that the first day after the procedure he had no issues. It was on day 2 post ERCP, that he began to experience his issues and this is occurring every 30 to 60 seconds. I am concerned about the potential of a brachial plexus injury/neuropathy, though I have never seen this and all of the time that I have performed GI procedures. We are going to go ahead and move forward with an urgent neurology referral. I will try to reach out to other colleagues to see if other imaging or nerve conduction testing should be requested as we wait for a neurology appointment versus him waiting for the urgent referral. If we have further updates we will let the patient know but again we will go ahead and let the patient know that we will move forward with a neurology referral for now. I am going to put his PCP on here as well, who may have more expertise with working up or evaluation of these particular symptoms to try to help the patient.  Patty, Let patient know we will place neurology referral and update him if we have other information for diagnostics that are recommended to [place]. Please follow-up by Thursday/Friday as to how the patient is doing. Thanks. GM  FYI DCN of mutual patient

## 2024-01-24 NOTE — Telephone Encounter (Signed)
 The pt states that his right arm from should to wrist and thumb has started going numb about every 30 min or so since ERCP.  No other complaints other than his usual stomach pain.  Please advise

## 2024-01-24 NOTE — Telephone Encounter (Addendum)
 Error

## 2024-01-25 MED ORDER — PANCRELIPASE (LIP-PROT-AMYL) 36000-114000 UNITS PO CPEP: 36000.0000 [IU] | ORAL_CAPSULE | ORAL | 4 refills | Status: DC

## 2024-01-25 NOTE — Telephone Encounter (Signed)
 Refill for Creon sent to pharmacy. Patient informed.

## 2024-02-03 ENCOUNTER — Other Ambulatory Visit (HOSPITAL_COMMUNITY): Payer: Self-pay

## 2024-02-03 ENCOUNTER — Other Ambulatory Visit (HOSPITAL_BASED_OUTPATIENT_CLINIC_OR_DEPARTMENT_OTHER): Payer: Self-pay

## 2024-02-03 MED ORDER — HYDROMORPHONE HCL 4 MG PO TABS
ORAL_TABLET | ORAL | 0 refills | Status: DC
Start: 2024-02-03 — End: 2024-02-16
  Filled 2024-02-03 (×2): qty 90, 30d supply, fill #0

## 2024-02-07 ENCOUNTER — Other Ambulatory Visit: Payer: Self-pay | Admitting: Medical Genetics

## 2024-02-08 NOTE — Progress Notes (Signed)
 Initial neurology clinic note  Reason for Evaluation: Consultation requested by [name], Donalee Citrin, NP for an opinion regarding right arm numbness. My final recommendations will be communicated back to the requesting physician by way of shared medical record or letter to requesting physician via [place] mail.  HPI: This is Mr. Carlos [name], a 38 y.o. right-handed male with a medical history of DM1, chronic pancreatitits c/b inflammatory biliary stricture, pseudocysts, and splenic vein thrombosis, chronic pain, depression, anxiety, current smoker who presents to neurology clinic with the chief complaint of right arm numbness. The patient is alone today.  Patient had ERCP on 01/05/24 to remove a bile duct stent. About 2 weeks later patient started having pain and numbness in his right arm. It is from his right shoulder area down the arm into the thumb. It comes and goes every 30 minutes. It is an electric like sensation, or fire sensation. He also endorses right hand grip weakness. He denies neck pain.  Symptoms are the same since onset. The pain initially increased, but now is staying the same. It is more frequent as time goes by though. He denies any obvious muscle atrophy. He denies rash.  He has never had similar symptoms. He denies symptoms in his LUE or either lower limb.  He has never had neck or back surgery.  He has lost about 10 lbs in the last couple weeks. He is not sure why. His weight does tend to fluctuate though.  EtOH use: None currently, but used to drink heavily 4-5 years ago Restrictive diet? No Family history of neuropathy/myopathy/neurologic disease? No  Per Dr. [name] via staff message regarding patient's ERCP on 01/05/24: "we have them in a prone position with head extended forward and they are covered and tied under a bedsheet so that they do not move during the procedure as they are under general anesthesia"   MEDICATIONS:  Outpatient Encounter Medications as of  02/16/2024  Medication Sig Note   gabapentin (NEURONTIN) 100 MG capsule Take 1 capsule (100 mg total) by mouth 2 (two) times daily.    HYDROmorphone (DILAUDID) 4 MG tablet Take 1 tablet (4 mg total) by mouth 5 (five) times daily as needed for 6 days, THEN 1 tablet  4 (four) times daily as needed for 6 days, THEN 1 tablet 3 (three) times daily as needed for 6 days, THEN 1 tablet 2 (two) times daily as needed for 6 days, THEN 1 tablet daily as needed for 6 days then stop.    insulin aspart (NOVOLOG FLEXPEN) 100 UNIT/ML FlexPen Max daily 30 units 01/05/2024: 6 u at 0100 01/05/24/bt    insulin glargine (LANTUS SOLOSTAR) 100 UNIT/ML Solostar Pen Inject 16 Units into the skin daily. 01/05/2024: 16 u at 2100 01/04/24/bt    Insulin Pen Needle 32G X 4 MM MISC 1 Device by Does not apply route in the morning, at noon, in the evening, and at bedtime.    lipase/protease/amylase (CREON) 36000 UNITS CPEP capsule Take 1-2 capsules (36,000-72,000 Units total) by mouth See admin instructions. Take 72,000 units by mouth three times a day before meals and 36,000 units with any snacks    multivitamin (ONE-A-DAY MEN'S) TABS tablet Take 1 tablet by mouth daily with breakfast.    naloxone (NARCAN) nasal spray 4 mg/0.1 mL Place 1 spray into the nose once as needed (Overdose).    omeprazole (PRILOSEC) 40 MG capsule TAKE 1 CAPSULE (40 MG TOTAL) BY MOUTH DAILY.    buprenorphine (BUTRANS) 7.5 MCG/HR Place 1  patch onto the skin once a week. (Patient not taking: Reported on 02/16/2024)    Continuous Glucose Sensor (DEXCOM G7 SENSOR) MISC 1 Device by Does not apply route as directed. (Patient not taking: Reported on 02/16/2024)    No facility-administered encounter medications on file as of 02/16/2024.    PAST MEDICAL HISTORY: Past Medical History:  Diagnosis Date   AKI (acute kidney injury) (HCC) 12/21/2021   Anxiety    Diabetes (HCC)    Pancreatitis    Splenic vein thrombosis     PAST SURGICAL HISTORY: Past Surgical  History:  Procedure Laterality Date   BILIARY BRUSHING  02/02/2023   Procedure: BILIARY BRUSHING;  Surgeon: Lemar Lofty., MD;  Location: [facility];  Service: Gastroenterology;;   BILIARY BRUSHING  04/28/2023   Procedure: BILIARY BRUSHING;  Surgeon: Meryl Dare, MD;  Location: [facility] ENDOSCOPY;  Service: Gastroenterology;;   BILIARY BRUSHING  06/02/2023   Procedure: BILIARY BRUSHING;  Surgeon: Lemar Lofty., MD;  Location: [facility];  Service: Gastroenterology;;   BILIARY BRUSHING  01/05/2024   Procedure: BILIARY BRUSHING;  Surgeon: Lemar Lofty., MD;  Location: [facility];  Service: Gastroenterology;;   BILIARY DILATION  12/01/2022   Procedure: BILIARY DILATION;  Surgeon: Lemar Lofty., MD;  Location: [facility];  Service: Gastroenterology;;   BILIARY DILATION  02/02/2023   Procedure: BILIARY DILATION;  Surgeon: Lemar Lofty., MD;  Location: [facility];  Service: Gastroenterology;;   BILIARY DILATION  01/05/2024   Procedure: BILIARY DILATION;  Surgeon: Lemar Lofty., MD;  Location: [facility];  Service: Gastroenterology;;   BILIARY STENT PLACEMENT  04/28/2023   Procedure: BILIARY STENT PLACEMENT;  Surgeon: Meryl Dare, MD;  Location: [facility]OSCOPY;  Service: Gastroenterology;;   BILIARY STENT PLACEMENT N/A 06/02/2023   Procedure: BILIARY STENT PLACEMENT;  Surgeon: Lemar Lofty., MD;  Location: [facility]OSCOPY;  Service: Gastroenterology;  Laterality: N/A;   BIOPSY  12/02/2021   Procedure: BIOPSY;  Surgeon: [name] [name], MD;  Location: [facility];  Service: Gastroenterology;;   ENDOSCOPIC RETROGRADE CHOLANGIOPANCREATOGRAPHY (ERCP) WITH PROPOFOL N/A 08/12/2022   Procedure: ENDOSCOPIC RETROGRADE CHOLANGIOPANCREATOGRAPHY (ERCP) WITH PROPOFOL;  Surgeon: Lemar Lofty., MD;  Location: [facility];  Service: Gastroenterology;  Laterality: N/A;   ENDOSCOPIC RETROGRADE CHOLANGIOPANCREATOGRAPHY  (ERCP) WITH PROPOFOL N/A 12/01/2022   Procedure: ENDOSCOPIC RETROGRADE CHOLANGIOPANCREATOGRAPHY (ERCP) WITH PROPOFOL;  Surgeon: [name] [name], MD;  Location: [facility];  Service: Gastroenterology;  Laterality: N/A;   ENDOSCOPIC RETROGRADE CHOLANGIOPANCREATOGRAPHY (ERCP) WITH PROPOFOL N/A 02/02/2023   Procedure: ENDOSCOPIC RETROGRADE CHOLANGIOPANCREATOGRAPHY (ERCP) WITH PROPOFOL;  Surgeon: [name] [name], MD;  Location: [facility];  Service: Gastroenterology;  Laterality: N/A;   ENDOSCOPIC RETROGRADE CHOLANGIOPANCREATOGRAPHY (ERCP) WITH PROPOFOL N/A 06/02/2023   Procedure: ENDOSCOPIC RETROGRADE CHOLANGIOPANCREATOGRAPHY (ERCP) WITH PROPOFOL;  Surgeon: [name] [name], MD;  Location: [facility];  Service: Gastroenterology;  Laterality: N/A;   ENDOSCOPIC RETROGRADE CHOLANGIOPANCREATOGRAPHY (ERCP) WITH PROPOFOL N/A 01/05/2024   Procedure: ENDOSCOPIC RETROGRADE CHOLANGIOPANCREATOGRAPHY (ERCP) WITH PROPOFOL;  Surgeon: [name] [name], MD;  Location: [facility];  Service: Gastroenterology;  Laterality: N/A;   ERCP N/A 04/28/2023   Procedure: ENDOSCOPIC RETROGRADE CHOLANGIOPANCREATOGRAPHY (ERCP);  Surgeon: Meryl Dare, MD;  Location: [facility] ENDOSCOPY;  Service: Gastroenterology;  Laterality: N/A;   ESOPHAGOGASTRODUODENOSCOPY N/A 12/01/2022   Procedure: ESOPHAGOGASTRODUODENOSCOPY (EGD);  Surgeon: Lemar Lofty., MD;  Location: [facility];  Service: Gastroenterology;  Laterality: N/A;   ESOPHAGOGASTRODUODENOSCOPY (EGD) WITH PROPOFOL N/A 12/02/2021   Procedure: ESOPHAGOGASTRODUODENOSCOPY (EGD) WITH PROPOFOL;  Surgeon: Lemar Lofty., MD;  Location: [facility];  Service: Gastroenterology;  Laterality: N/A;   ESOPHAGOGASTRODUODENOSCOPY (EGD) WITH PROPOFOL N/A 07/09/2023   Procedure: ESOPHAGOGASTRODUODENOSCOPY (EGD) WITH PROPOFOL;  Surgeon: Hilarie Fredrickson, MD;  Location: [facility];  Service: Gastroenterology;  Laterality: N/A;  needs ercp scope for  stent pull   FINE NEEDLE ASPIRATION N/A 12/01/2022   Procedure: FINE NEEDLE ASPIRATION (FNA) LINEAR;  Surgeon: Lemar Lofty., MD;  Location: [facility];  Service: Gastroenterology;  Laterality: N/A;   NEUROLYTIC CELIAC PLEXUS  12/01/2022   Procedure: CELIAC PLEXUS BLOCK;  Surgeon: [name], [name], MD;  Location: [facility];  Service: Gastroenterology;;   PANCREATIC STENT PLACEMENT  08/12/2022   Procedure: PANCREATIC STENT PLACEMENT;  Surgeon: Lemar Lofty., MD;  Location: [facility];  Service: Gastroenterology;;   PANCREATIC STENT PLACEMENT  12/01/2022   Procedure: PANCREATIC STENT PLACEMENT;  Surgeon: Lemar Lofty., MD;  Location: [facility];  Service: Gastroenterology;;   PANCREATIC STENT PLACEMENT  02/02/2023   Procedure: PANCREATIC STENT PLACEMENT;  Surgeon: Lemar Lofty., MD;  Location: [facility];  Service: Gastroenterology;;   PANCREATIC STENT PLACEMENT  06/02/2023   Procedure: PANCREATIC STENT PLACEMENT;  Surgeon: Lemar Lofty., MD;  Location: [facility];  Service: Gastroenterology;;   REMOVAL OF STONES  12/01/2022   Procedure: REMOVAL OF STONES;  Surgeon: Lemar Lofty., MD;  Location: [facility];  Service: Gastroenterology;;   REMOVAL OF STONES  02/02/2023   Procedure: REMOVAL OF STONES;  Surgeon: Lemar Lofty., MD;  Location: [facility];  Service: Gastroenterology;;   REMOVAL OF STONES  06/02/2023   Procedure: REMOVAL OF STONES;  Surgeon: Lemar Lofty., MD;  Location: [facility];  Service: Gastroenterology;;   REMOVAL OF STONES  01/05/2024   Procedure: REMOVAL OF SLUDGE;  Surgeon: Lemar Lofty., MD;  Location: [facility];  Service: Gastroenterology;;   [name]  08/12/2022   Procedure: [name];  Surgeon: Lemar Lofty., MD;  Location: [facility];  Service: Gastroenterology;;   [name]  12/01/2022   Procedure: [name];  Surgeon:  Lemar Lofty., MD;  Location: [facility];  Service: Gastroenterology;;   [name]  06/02/2023   Procedure: [name];  Surgeon: Lemar Lofty., MD;  Location: [facility];  Service: Gastroenterology;;   Francine Graven REMOVAL  12/01/2022   Procedure: STENT REMOVAL;  Surgeon: Lemar Lofty., MD;  Location: [facility];  Service: Gastroenterology;;   Francine Graven REMOVAL  02/02/2023   Procedure: STENT REMOVAL;  Surgeon: Lemar Lofty., MD;  Location: [facility];  Service: Gastroenterology;;   Francine Graven REMOVAL  06/02/2023   Procedure: STENT REMOVAL;  Surgeon: Lemar Lofty., MD;  Location: [facility];  Service: Gastroenterology;;   Francine Graven REMOVAL  07/09/2023   Procedure: STENT REMOVAL;  Surgeon: Hilarie Fredrickson, MD;  Location: [facility];  Service: Gastroenterology;;   Francine Graven REMOVAL  01/05/2024   Procedure: STENT REMOVAL;  Surgeon: Lemar Lofty., MD;  Location: [facility];  Service: Gastroenterology;;   UPPER ESOPHAGEAL ENDOSCOPIC ULTRASOUND (EUS) N/A 12/02/2021   Procedure: UPPER ESOPHAGEAL ENDOSCOPIC ULTRASOUND (EUS);  Surgeon: Lemar Lofty., MD;  Location: [facility];  Service: Gastroenterology;  Laterality: N/A;   UPPER ESOPHAGEAL ENDOSCOPIC ULTRASOUND (EUS) N/A 12/01/2022   Procedure: UPPER ESOPHAGEAL ENDOSCOPIC ULTRASOUND (EUS);  Surgeon: Lemar Lofty., MD;  Location: [facility];  Service: Gastroenterology;  Laterality: N/A;    ALLERGIES: Allergies  Allergen Reactions   Other Anaphylaxis and Other (See Comments)    [name] - anaphylaxis    FAMILY HISTORY: Family History  Problem Relation Age of  Onset   Hypertension Mother    Alcoholism Maternal Uncle    Diabetes Maternal Grandmother    Diabetes Maternal Grandfather    Diabetes Paternal Grandmother    Diabetes Paternal Grandfather    Colon cancer Neg Hx    Esophageal cancer Neg Hx    Inflammatory bowel disease Neg Hx    Liver disease Neg Hx     Pancreatic cancer Neg Hx    Rectal cancer Neg Hx    Stomach cancer Neg Hx     SOCIAL HISTORY: Social History   Tobacco Use   Smoking status: Some Days    Types: Cigars    Start date: 11/08/2002   Smokeless tobacco: Never   Tobacco comments:    1/2 a day cig  Vaping Use   Vaping status: Never Used  Substance Use Topics   Alcohol use: Not Currently   Drug use: Never   Social History   Social History Narrative   Are you right handed or left handed? Right   Are you currently employed ?    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    Who lives with you? Girl friend    What type of home do you live in: 1 story or 2 story? one    Caffiene 3 times a week      OBJECTIVE: PHYSICAL EXAM: BP (!) 154/101   Pulse (!) 101   Ht 6\' 5"  (1.956 m)   Wt 152 lb (68.9 kg)   SpO2 100%   BMI 18.02 kg/m   General: General appearance: Awake and alert. No distress. Cooperative with exam.  Skin: No obvious rash or jaundice. HEENT: Atraumatic. Anicteric. Lungs: Non-labored breathing on room air  Extremities: No edema. No obvious deformity.  Musculoskeletal: No obvious joint swelling. Psych: Affect appropriate.  Neurological: Mental Status: Alert. Speech fluent. No pseudobulbar affect Cranial Nerves: CNII: No RAPD. Visual fields grossly intact. CNIII, IV, VI: PERRL. No nystagmus. EOMI. CN V: Facial sensation intact bilaterally to fine touch. CN VII: Facial muscles symmetric and strong. No ptosis at rest. CN VIII: Hearing grossly intact bilaterally. CN IX: No hypophonia. CN X: Palate elevates symmetrically. CN XI: Full strength shoulder shrug bilaterally. CN XII: Tongue protrusion full and midline. No atrophy or fasciculations. No significant dysarthria Motor: Tone is normal. No fasciculations in upper extremities. No atrophy. No scapular winging.  Individual muscle group testing (MRC grade out of 5): Mild give way weakness on  right  Movement     Neck flexion 5    Neck extension 5     Right Left   Shoulder abduction 5 5   Shoulder adduction 5 5   Shoulder ext rotation 5 5   Shoulder int rotation 5 5   Elbow flexion 5 5   Elbow extension 5 5   Wrist extension 5 5   Wrist flexion 5 5   Finger abduction - FDI 5 5   Finger abduction - ADM 5 5   Finger extension 5 5   Finger distal flexion - 2/3 5 5    Finger distal flexion - 4/5 5 5    Thumb flexion - FPL 5 5   Thumb abduction - APB 5 5    Hip flexion 5 5   Knee extension 5 5   Knee flexion 5 5   Dorsiflexion 5 5   Plantarflexion 5 5     Reflexes:  Right Left   Bicep 2+ 2+  Tricep 2+ 2+   BrRad 2+ 2+   Knee 2+ 2+   Ankle 2+ 2+    Pathological Reflexes: Hoffman: Absent bilaterally Troemner: Absent bilaterally Sensation: Pinprick: Diminished in right shoulder and entire right upper limb except in medial aspect of palm. Intact in LUE and bilateral LE. Coordination: Intact finger-to- nose-finger bilaterally. Romberg negative. Gait: Able to rise from chair with arms crossed unassisted. Normal, narrow-based gait.  Lab and Test Review: Internal labs: 11/07/23: B12: 427 TSH wnl CMP significant for glucose of 277, alk phos 316, AST 67 CBC w/ diff significant for MCV 104.8  ASSESSMENT: Haylen Shelnutt is a 38 y.o. male who presents for evaluation of right shoulder and arm pain. He has a relevant medical history of DM1, chronic pancreatitits c/b inflammatory biliary stricture, pseudocysts, and splenic vein thrombosis, chronic pain, depression, anxiety, current smoker. His neurological examination is pertinent for diminished sensation in the entire right arm except the medial aspect of the palm (ulnar side). The etiology of patient's symptoms is currently unclear. He had ERCP procedure a couple of weeks prior to symptoms, so there is some concern of stretch injury to the brachial plexus or could even be inflammatory attack such as neuralgic amyotrophy, but  patient does not currently have significant weakness or atrophy, but it could be too early for this. I will attempt to clarify if this is a nerve issue via EMG.  PLAN: -EMG: RUE (BP protocol) -Gabapentin 100 mg BID. 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 All questions were answered to their satisfaction.  When available, results of the above investigations and possible further recommendations will be communicated to the patient via telephone/MyChart. Patient to call office if not contacted after expected testing turnaround time.   Total time spent reviewing records, interview, history/exam, documentation, and coordination of care on day of encounter:  50 min   Thank you for allowing me to participate in patient's care.  If I can answer any additional questions, I would be pleased to do so.  Jacquelyne Balint, MD   CC: [name], Donalee Citrin, NP [address] [place] [address]  CC: Referring provider: Caesar Bookman, NP [address] [place],  [place] [address]

## 2024-02-13 ENCOUNTER — Encounter: Payer: Medicaid Other | Admitting: Family

## 2024-02-16 ENCOUNTER — Other Ambulatory Visit: Payer: Self-pay

## 2024-02-16 ENCOUNTER — Encounter: Payer: Self-pay | Admitting: Neurology

## 2024-02-16 ENCOUNTER — Other Ambulatory Visit (HOSPITAL_BASED_OUTPATIENT_CLINIC_OR_DEPARTMENT_OTHER): Payer: Self-pay

## 2024-02-16 ENCOUNTER — Ambulatory Visit: Admitting: Neurology

## 2024-02-16 VITALS — BP 154/101 | HR 101 | Ht 77.0 in | Wt 152.0 lb

## 2024-02-16 DIAGNOSIS — M79601 Pain in right arm: Secondary | ICD-10-CM | POA: Diagnosis not present

## 2024-02-16 DIAGNOSIS — M792 Neuralgia and neuritis, unspecified: Secondary | ICD-10-CM | POA: Diagnosis not present

## 2024-02-16 DIAGNOSIS — R29898 Other symptoms and signs involving the musculoskeletal system: Secondary | ICD-10-CM | POA: Diagnosis not present

## 2024-02-16 MED ORDER — GABAPENTIN 100 MG PO CAPS
100.0000 mg | ORAL_CAPSULE | Freq: Two times a day (BID) | ORAL | 5 refills | Status: DC
  Filled 2024-02-16: qty 60, 30d supply, fill #0
  Filled 2024-03-16: qty 60, 30d supply, fill #1
  Filled 2024-04-11: qty 60, 30d supply, fill #2
  Filled 2024-05-07: qty 60, 30d supply, fill #3
  Filled 2024-06-11: qty 60, 30d supply, fill #4
  Filled 2024-07-16: qty 60, 30d supply, fill #5

## 2024-02-16 NOTE — Patient Instructions (Addendum)
 I saw you today for right shoulder and arm pain. This could be a nerve problem, but it is not currently clear.  I would like to do nerve testing called EMG (see more information below). We will discuss next steps after this testing.  I am prescribing gabapentin 100 mg twice daily for the pain. You should start it before bed to see how you react to it. It can make you sleepy or dizzy, so be careful and do not operate heavy machinery if the medication is causing these symptoms. This is a very low dose though, so hopefully this will not happen.  Let me know if the medication is causing you problems or not helping.  The physicians and staff at [facility] are committed to providing excellent care. You may receive a survey requesting feedback about your experience at our office. We strive to receive "very good" responses to the survey questions. If you feel that your experience would prevent you from giving the office a "very good " response, please contact our office to try to remedy the situation. We may be reached at [phone]. Thank you for taking the time out of your busy day to complete the survey.  Jacquelyne Balint, MD [facility]  ELECTROMYOGRAM AND NERVE CONDUCTION STUDIES (EMG/NCS) INSTRUCTIONS  How to Prepare The neurologist conducting the EMG will need to know if you have certain medical conditions. Tell the neurologist and other EMG lab personnel if you: Have a pacemaker or any other electrical medical device Take blood-thinning medications Have hemophilia, a blood-clotting disorder that causes prolonged bleeding Bathing Take a shower or bath shortly before your exam in order to remove oils from your skin. Don't apply lotions or creams before the exam.  What to Expect You'll likely be asked to change into a hospital gown for the procedure and lie down on an examination table. The following explanations can help you understand what will happen during the exam.  Electrodes.  The neurologist or a technician places surface electrodes at various locations on your skin depending on where you're experiencing symptoms. Or the neurologist may insert needle electrodes at different sites depending on your symptoms.  Sensations. The electrodes will at times transmit a tiny electrical current that you may feel as a twinge or spasm. The needle electrode may cause discomfort or pain that usually ends shortly after the needle is removed. If you are concerned about discomfort or pain, you may want to talk to the neurologist about taking a short break during the exam.  Instructions. During the needle EMG, the neurologist will assess whether there is any spontaneous electrical activity when the muscle is at rest - activity that isn't present in healthy muscle tissue - and the degree of activity when you slightly contract the muscle.  He or she will give you instructions on resting and contracting a muscle at appropriate times. Depending on what muscles and nerves the neurologist is examining, he or she may ask you to change positions during the exam.  After your EMG You may experience some temporary, minor bruising where the needle electrode was inserted into your muscle. This bruising should fade within several days. If it persists, contact your primary care doctor.

## 2024-02-19 NOTE — Progress Notes (Signed)
   This encounter was created in error - please disregard. No show

## 2024-03-02 ENCOUNTER — Ambulatory Visit: Payer: Medicaid Other | Admitting: Gastroenterology

## 2024-03-13 ENCOUNTER — Telehealth: Payer: Self-pay | Admitting: Neurology

## 2024-03-13 ENCOUNTER — Ambulatory Visit: Admitting: Neurology

## 2024-03-13 DIAGNOSIS — R29898 Other symptoms and signs involving the musculoskeletal system: Secondary | ICD-10-CM

## 2024-03-13 DIAGNOSIS — M79601 Pain in right arm: Secondary | ICD-10-CM

## 2024-03-13 DIAGNOSIS — M792 Neuralgia and neuritis, unspecified: Secondary | ICD-10-CM

## 2024-03-13 NOTE — Procedures (Signed)
 [facility]  [address] [place], [address]  [place], [place] [address] Tel: [phone] Fax: [phone] Test Date:  03/13/2024  Patient: Carlos [name] DOB: 1986/08/29 Physician: Rommie Coats, MD  Sex: Male Height: 6\' 5"  Ref Phys: Rommie Coats, MD  ID#: [id number]   Technician:    History: This is a 38 year old male with right arm numbness.  NCV & EMG Findings: Extensive electrodiagnostic evaluation of the right upper limb with additional nerve conduction studies of the left upper limb shows: Bilateral lateral antebrachial cutaneous sensory responses show reduced amplitude (L12, R13 V), but are symmetric, likely suggesting these are normal responses or technical in nature. Right median, ulnar, radial, and bilateral medial antebrachial cutaneous sensory responses are within normal limits. Right median (APB) and ulnar (ADM) motor responses are within normal limits. There is no evidence of active or chronic motor axon loss changes affecting any of the tested muscles on needle examination. Motor unit configuration and recruitment pattern is within normal limits.  Impression: This study shows no significant abnormalities. Specifically, there is no definitive electrodiagnostic evidence of right brachial plexopathy, right cervical (C5-C8) motor radiculopathy, or right median, ulnar, or radial mononeuropathies.    ___________________________ Rommie Coats, MD    Nerve Conduction Studies Motor Nerve Results    Latency Amplitude F-Lat Segment Distance CV Comment  Site (ms) Norm (mV) Norm (ms)  (cm) (m/s) Norm   Right Median (APB) Motor  Wrist 3.0  < 3.9 14.1  > 6.0        Elbow 9.3 - 13.6 -  Elbow-Wrist 31.5 50  > 50   Right Ulnar (ADM) Motor  Wrist 2.7  < 3.1 12.4  > 7.0        Bel elbow 7.6 - 11.4 -  Bel elbow-Wrist 25 51  > 50   Ab elbow 9.6 - 10.6 -  Ab elbow-Bel elbow 10 50 -    Sensory Sites    Neg Peak Lat Amplitude (O-P) Segment Distance Velocity Comment  Site (ms) Norm  (V) Norm  (cm) (ms)   Left Lateral Antebrachial Cutaneous Sensory  Lat biceps-Lat forearm 2.8  < 2.9 *12  > 14 Lat biceps-Lat forearm 12    Right Lateral Antebrachial Cutaneous Sensory  Lat biceps-Lat forearm 2.7  < 2.9 *13  > 14 Lat biceps-Lat forearm 12    Left Medial Antebrachial Cutaneous Sensory  Elbow-Med forearm 3.2  < 3.2 7  > 5 Elbow-Med forearm 14    Right Medial Antebrachial Cutaneous Sensory  Elbow-Med forearm 2.6  < 3.2 7  > 5 Elbow-Med forearm 12    Right Median Sensory  Wrist-Dig II 3.4  < 3.4 27  > 20 Wrist-Dig II 13    Right Radial Sensory  Forearm-Wrist 2.2  < 2.7 19  > 18 Forearm-Wrist 10    Right Ulnar Sensory  Wrist-Dig V 2.8  < 3.1 15  > 12 Wrist-Dig V 11     Electromyography   Side Muscle Ins.Act Fibs Fasc Recrt Amp Dur Poly Activation Comment  Right FDI Nml Nml Nml Nml Nml Nml Nml Nml N/A  Right EIP Nml Nml Nml Nml Nml Nml Nml Nml N/A  Right Pronator teres Nml Nml Nml Nml Nml Nml Nml Nml N/A  Right FDP Nml Nml Nml Nml Nml Nml Nml Nml N/A  Right Biceps Nml Nml Nml Nml Nml Nml Nml Nml N/A  Right Triceps Nml Nml Nml Nml Nml Nml Nml Nml N/A  Right Deltoid Nml Nml Nml  Nml Nml Nml Nml Nml N/A      Waveforms:  Motor      Sensory

## 2024-03-13 NOTE — Telephone Encounter (Signed)
 Discussed the results of patient's EMG after the procedure today. EMG was essentially normal with no evidence of right brachial plexopathy, right cervical radiculopathy, or median, ulnar, or radial mononeuropathies.   Patient has had some improvement since starting gabapentin . I advised him to continue this for now. If he does not continue to see improvement, or if he has worsening or new symptoms, he will reach out and let me know.  All questions were answered.  Rommie Coats, MD [facility]

## 2024-03-27 ENCOUNTER — Other Ambulatory Visit: Payer: Self-pay

## 2024-03-27 ENCOUNTER — Inpatient Hospital Stay (HOSPITAL_COMMUNITY)
Admission: EM | Admit: 2024-03-27 | Discharge: 2024-03-29 | DRG: 638 | Disposition: A | Discharge status: Home / Self Care | Attending: Family Medicine | Admitting: Family Medicine

## 2024-03-27 ENCOUNTER — Emergency Department (HOSPITAL_COMMUNITY)

## 2024-03-27 ENCOUNTER — Encounter (HOSPITAL_COMMUNITY): Payer: Self-pay

## 2024-03-27 ENCOUNTER — Inpatient Hospital Stay (HOSPITAL_COMMUNITY)

## 2024-03-27 DIAGNOSIS — R7989 Other specified abnormal findings of blood chemistry: Secondary | ICD-10-CM | POA: Diagnosis present

## 2024-03-27 DIAGNOSIS — R1010 Upper abdominal pain, unspecified: Secondary | ICD-10-CM

## 2024-03-27 DIAGNOSIS — Z794 Long term (current) use of insulin: Secondary | ICD-10-CM

## 2024-03-27 DIAGNOSIS — F17290 Nicotine dependence, other tobacco product, uncomplicated: Secondary | ICD-10-CM | POA: Diagnosis present

## 2024-03-27 DIAGNOSIS — K219 Gastro-esophageal reflux disease without esophagitis: Secondary | ICD-10-CM | POA: Diagnosis present

## 2024-03-27 DIAGNOSIS — I10 Essential (primary) hypertension: Secondary | ICD-10-CM | POA: Diagnosis present

## 2024-03-27 DIAGNOSIS — E1010 Type 1 diabetes mellitus with ketoacidosis without coma: Principal | ICD-10-CM | POA: Diagnosis present

## 2024-03-27 DIAGNOSIS — K861 Other chronic pancreatitis: Secondary | ICD-10-CM | POA: Diagnosis present

## 2024-03-27 DIAGNOSIS — K3184 Gastroparesis: Secondary | ICD-10-CM | POA: Diagnosis present

## 2024-03-27 DIAGNOSIS — Z8249 Family history of ischemic heart disease and other diseases of the circulatory system: Secondary | ICD-10-CM | POA: Diagnosis not present

## 2024-03-27 DIAGNOSIS — E1110 Type 2 diabetes mellitus with ketoacidosis without coma: Secondary | ICD-10-CM | POA: Diagnosis present

## 2024-03-27 DIAGNOSIS — Z86718 Personal history of other venous thrombosis and embolism: Secondary | ICD-10-CM | POA: Diagnosis not present

## 2024-03-27 DIAGNOSIS — Z833 Family history of diabetes mellitus: Secondary | ICD-10-CM

## 2024-03-27 DIAGNOSIS — E860 Dehydration: Secondary | ICD-10-CM | POA: Diagnosis present

## 2024-03-27 DIAGNOSIS — Z79899 Other long term (current) drug therapy: Secondary | ICD-10-CM

## 2024-03-27 DIAGNOSIS — E1043 Type 1 diabetes mellitus with diabetic autonomic (poly)neuropathy: Secondary | ICD-10-CM | POA: Diagnosis present

## 2024-03-27 LAB — COMPREHENSIVE METABOLIC PANEL WITH GFR
ALT: 72 U/L — ABNORMAL HIGH (ref 0–44)
AST: 65 U/L — ABNORMAL HIGH (ref 15–41)
Albumin: 5.4 g/dL — ABNORMAL HIGH (ref 3.5–5.0)
Alkaline Phosphatase: 179 U/L — ABNORMAL HIGH (ref 38–126)
BUN: 10 mg/dL (ref 6–20)
CO2: <7 mmol/L — ABNORMAL LOW (ref 22–32)
Calcium: 13.6 mg/dL (ref 8.9–10.3)
Chloride: 89 mmol/L — ABNORMAL LOW (ref 98–111)
Creatinine, Ser: 1.41 mg/dL — ABNORMAL HIGH (ref 0.61–1.24)
GFR, Estimated: >60 mL/min (ref >60)
Glucose, Bld: 515 mg/dL (ref 70–99)
Potassium: 4.4 mmol/L (ref 3.5–5.1)
Sodium: 135 mmol/L (ref 135–145)
Total Bilirubin: 2.2 mg/dL — ABNORMAL HIGH (ref 0.0–1.2)
Total Protein: 10.4 g/dL — ABNORMAL HIGH (ref 6.5–8.1)

## 2024-03-27 LAB — BASIC METABOLIC PANEL WITH GFR
Anion gap: 19 — ABNORMAL HIGH (ref 5–15)
Anion gap: 17 — ABNORMAL HIGH (ref 5–15)
BUN: 63 mg/dL — ABNORMAL HIGH (ref 6–20)
BUN: 10 mg/dL (ref 6–20)
CO2: 28 mmol/L (ref 22–32)
CO2: 13 mmol/L — ABNORMAL LOW (ref 22–32)
Calcium: 11.1 mg/dL — ABNORMAL HIGH (ref 8.9–10.3)
Calcium: 10.2 mg/dL (ref 8.9–10.3)
Chloride: 89 mmol/L — ABNORMAL LOW (ref 98–111)
Chloride: 103 mmol/L (ref 98–111)
Creatinine, Ser: 7.05 mg/dL — ABNORMAL HIGH (ref 0.61–1.24)
Creatinine, Ser: 1.02 mg/dL (ref 0.61–1.24)
GFR, Estimated: 9 mL/min — ABNORMAL LOW (ref >60)
GFR, Estimated: >60 mL/min (ref >60)
Glucose, Bld: 211 mg/dL — ABNORMAL HIGH (ref 70–99)
Glucose, Bld: 159 mg/dL — ABNORMAL HIGH (ref 70–99)
Potassium: 4.8 mmol/L (ref 3.5–5.1)
Potassium: 4.9 mmol/L (ref 3.5–5.1)
Sodium: 136 mmol/L (ref 135–145)
Sodium: 133 mmol/L — ABNORMAL LOW (ref 135–145)

## 2024-03-27 LAB — CBG MONITORING, ED
Glucose-Capillary: 514 mg/dL (ref 70–99)
Glucose-Capillary: 506 mg/dL (ref 70–99)
Glucose-Capillary: 339 mg/dL — ABNORMAL HIGH (ref 70–99)
Glucose-Capillary: 453 mg/dL — ABNORMAL HIGH (ref 70–99)

## 2024-03-27 LAB — BLOOD GAS, VENOUS
Acid-base deficit: 24 mmol/L — ABNORMAL HIGH (ref 0.0–2.0)
Bicarbonate: 6.6 mmol/L — ABNORMAL LOW (ref 20.0–28.0)
O2 Saturation: 51.6 %
Patient temperature: 37
pCO2, Ven: 28 mmHg — ABNORMAL LOW (ref 44–60)
pH, Ven: 6.98 — CL (ref 7.25–7.43)
pO2, Ven: 36 mmHg (ref 32–45)

## 2024-03-27 LAB — CBC WITH DIFFERENTIAL/PLATELET
Abs Immature Granulocytes: 0.09 10*3/uL — ABNORMAL HIGH (ref 0.00–0.07)
Basophils Absolute: 0 10*3/uL (ref 0.0–0.1)
Basophils Relative: 0 %
Eosinophils Absolute: 0 10*3/uL (ref 0.0–0.5)
Eosinophils Relative: 0 %
HCT: 50.4 % (ref 39.0–52.0)
Hemoglobin: 16.6 g/dL (ref 13.0–17.0)
Immature Granulocytes: 1 %
Lymphocytes Relative: 3 %
Lymphs Abs: 0.5 10*3/uL — ABNORMAL LOW (ref 0.7–4.0)
MCH: 36.6 pg — ABNORMAL HIGH (ref 26.0–34.0)
MCHC: 32.9 g/dL (ref 30.0–36.0)
MCV: 111.3 fL — ABNORMAL HIGH (ref 80.0–100.0)
Monocytes Absolute: 0.9 10*3/uL (ref 0.1–1.0)
Monocytes Relative: 7 %
Neutro Abs: 12.8 10*3/uL — ABNORMAL HIGH (ref 1.7–7.7)
Neutrophils Relative %: 89 %
Platelets: 284 10*3/uL (ref 150–400)
RBC: 4.53 MIL/uL (ref 4.22–5.81)
RDW: 11.9 % (ref 11.5–15.5)
WBC: 14.3 10*3/uL — ABNORMAL HIGH (ref 4.0–10.5)
nRBC: 0 % (ref 0.0–0.2)

## 2024-03-27 LAB — URINALYSIS, ROUTINE W REFLEX MICROSCOPIC
Bacteria, UA: NONE SEEN
Bilirubin Urine: NEGATIVE
Glucose, UA: >=500 mg/dL — AB
Ketones, ur: 80 mg/dL — AB
Leukocytes,Ua: NEGATIVE
Nitrite: NEGATIVE
Protein, ur: 100 mg/dL — AB
Specific Gravity, Urine: 1.016 (ref 1.005–1.030)
pH: 5 (ref 5.0–8.0)

## 2024-03-27 LAB — BETA-HYDROXYBUTYRIC ACID
Beta-Hydroxybutyric Acid: >8 mmol/L — ABNORMAL HIGH (ref 0.05–0.27)
Beta-Hydroxybutyric Acid: 7.39 mmol/L — ABNORMAL HIGH (ref 0.05–0.27)

## 2024-03-27 LAB — GLUCOSE, CAPILLARY
Glucose-Capillary: 289 mg/dL — ABNORMAL HIGH (ref 70–99)
Glucose-Capillary: 210 mg/dL — ABNORMAL HIGH (ref 70–99)
Glucose-Capillary: 177 mg/dL — ABNORMAL HIGH (ref 70–99)

## 2024-03-27 LAB — MRSA NEXT GEN BY PCR, NASAL: MRSA by PCR Next Gen: NOT DETECTED

## 2024-03-27 LAB — LIPASE, BLOOD: Lipase: 52 U/L — ABNORMAL HIGH (ref 11–51)

## 2024-03-27 MED ORDER — ALBUTEROL SULFATE (2.5 MG/3ML) 0.083% IN NEBU: 2.5000 mg | INHALATION_SOLUTION | RESPIRATORY_TRACT | Status: DC | PRN

## 2024-03-27 MED ORDER — ADULT MULTIVITAMIN W/MINERALS CH
1.0000 | ORAL_TABLET | Freq: Every day | ORAL | Status: DC
  Administered 2024-03-28 – 2024-03-29 (×2): 1 via ORAL
  Filled 2024-03-27 (×2): qty 1

## 2024-03-27 MED ORDER — HYDROMORPHONE HCL 1 MG/ML IJ SOLN
0.5000 mg | INTRAMUSCULAR | Status: DC | PRN
  Administered 2024-03-27: 0.5 mg via INTRAVENOUS
  Administered 2024-03-28 – 2024-03-29 (×12): 1 mg via INTRAVENOUS
  Filled 2024-03-27 (×14): qty 1

## 2024-03-27 MED ORDER — SODIUM CHLORIDE 0.9 % IV BOLUS
1000.0000 mL | Freq: Once | INTRAVENOUS | Status: AC
  Administered 2024-03-27: 1000 mL via INTRAVENOUS

## 2024-03-27 MED ORDER — TRAZODONE HCL 50 MG PO TABS: 50.0000 mg | ORAL_TABLET | Freq: Every evening | ORAL | Status: DC | PRN

## 2024-03-27 MED ORDER — PANTOPRAZOLE SODIUM 40 MG IV SOLR
40.0000 mg | Freq: Every day | INTRAVENOUS | Status: DC
  Administered 2024-03-27 – 2024-03-29 (×3): 40 mg via INTRAVENOUS
  Filled 2024-03-27 (×3): qty 10

## 2024-03-27 MED ORDER — ORAL CARE MOUTH RINSE: 15.0000 mL | OROMUCOSAL | Status: DC | PRN

## 2024-03-27 MED ORDER — POTASSIUM CHLORIDE 10 MEQ/100ML IV SOLN
10.0000 meq | INTRAVENOUS | Status: AC
  Administered 2024-03-27 (×2): 10 meq via INTRAVENOUS
  Filled 2024-03-27 (×2): qty 100

## 2024-03-27 MED ORDER — OXYCODONE HCL 5 MG PO TABS
5.0000 mg | ORAL_TABLET | ORAL | Status: DC | PRN
  Administered 2024-03-27 – 2024-03-28 (×2): 5 mg via ORAL
  Filled 2024-03-27 (×2): qty 1

## 2024-03-27 MED ORDER — ONDANSETRON HCL 4 MG/2ML IJ SOLN
4.0000 mg | Freq: Once | INTRAMUSCULAR | Status: AC
  Administered 2024-03-27: 4 mg via INTRAVENOUS
  Filled 2024-03-27: qty 2

## 2024-03-27 MED ORDER — ONDANSETRON HCL 4 MG PO TABS: 4.0000 mg | ORAL_TABLET | Freq: Four times a day (QID) | ORAL | Status: DC | PRN

## 2024-03-27 MED ORDER — HYDROMORPHONE HCL 1 MG/ML IJ SOLN
0.5000 mg | Freq: Once | INTRAMUSCULAR | Status: AC
  Administered 2024-03-27: 0.5 mg via INTRAVENOUS
  Filled 2024-03-27: qty 1

## 2024-03-27 MED ORDER — INSULIN REGULAR(HUMAN) IN NACL 100-0.9 UT/100ML-% IV SOLN
INTRAVENOUS | Status: DC
  Administered 2024-03-27: 8.5 [IU]/h via INTRAVENOUS
  Filled 2024-03-27: qty 100

## 2024-03-27 MED ORDER — GABAPENTIN 100 MG PO CAPS
100.0000 mg | ORAL_CAPSULE | Freq: Two times a day (BID) | ORAL | Status: DC
  Administered 2024-03-27 – 2024-03-29 (×4): 100 mg via ORAL
  Filled 2024-03-27 (×4): qty 1

## 2024-03-27 MED ORDER — IBUPROFEN 200 MG PO TABS: 400.0000 mg | ORAL_TABLET | Freq: Four times a day (QID) | ORAL | Status: DC | PRN

## 2024-03-27 MED ORDER — CHLORHEXIDINE GLUCONATE CLOTH 2 % EX PADS
6.0000 | MEDICATED_PAD | Freq: Every day | CUTANEOUS | Status: DC
  Administered 2024-03-27: 6 via TOPICAL

## 2024-03-27 MED ORDER — DEXTROSE 50 % IV SOLN
0.0000 mL | INTRAVENOUS | Status: DC | PRN
Start: 2024-03-27 — End: 2024-03-29

## 2024-03-27 MED ORDER — DEXTROSE IN LACTATED RINGERS 5 % IV SOLN: INTRAVENOUS | Status: DC

## 2024-03-27 MED ORDER — ONDANSETRON HCL 4 MG/2ML IJ SOLN
4.0000 mg | Freq: Four times a day (QID) | INTRAMUSCULAR | Status: DC | PRN
Start: 2024-03-27 — End: 2024-03-29

## 2024-03-27 MED ORDER — ENOXAPARIN SODIUM 40 MG/0.4ML IJ SOSY
40.0000 mg | PREFILLED_SYRINGE | INTRAMUSCULAR | Status: DC
  Administered 2024-03-27 – 2024-03-28 (×2): 40 mg via SUBCUTANEOUS
  Filled 2024-03-27 (×2): qty 0.4

## 2024-03-27 MED ORDER — LACTATED RINGERS IV SOLN: INTRAVENOUS | Status: AC

## 2024-03-27 MED ORDER — IOHEXOL 300 MG/ML  SOLN
100.0000 mL | Freq: Once | INTRAMUSCULAR | Status: AC | PRN
  Administered 2024-03-28: 100 mL via INTRAVENOUS

## 2024-03-27 MED ORDER — LACTATED RINGERS IV BOLUS
20.0000 mL/kg | Freq: Once | INTRAVENOUS | Status: AC
  Administered 2024-03-27: 1000 mL via INTRAVENOUS

## 2024-03-27 NOTE — ED Notes (Signed)
 ED TO INPATIENT HANDOFF REPORT  Name/Age/Gender Carlos [name] 38 y.o. male  Code Status    Code Status Orders  (From admission, onward)           Start     Ordered   03/27/24 1857  Full code  Continuous       Question:  By:  Answer:  Consent: discussion documented in EHR   03/27/24 1856           Code Status History     Date Active Date Inactive Code Status Order ID Comments User Context   08/25/2023 2332 08/29/2023 1834 Full Code [id number]  Davida Espy, MD ED   08/07/2023 0942 08/10/2023 2335 Full Code [id number]  Gaylin Ke, MD Inpatient   07/08/2023 0748 07/10/2023 1945 Full Code [id number]  Gaylin Ke, MD Inpatient   04/22/2023 1923 04/30/2023 1839 Full Code [id number]  Walton Guppy, MD ED   02/03/2023 1536 02/05/2023 2022 Full Code [id number]  Gaylin Ke, MD ED   11/11/2022 1241 11/12/2022 2030 Full Code [id number]  Arch Ko A, DO ED   08/14/2022 0016 08/19/2022 1733 Full Code [id number]  Tu, Alice Ao, DO ED   07/20/2022 0907 07/22/2022 2244 Full Code [id number]  Lena Qualia, MD ED   07/20/2022 0200 07/20/2022 0907 Full Code [id number]  Howerter, Gattis Kass, DO ED   04/27/2022 0202 04/30/2022 1757 Full Code [id number]  True Fuss, MD ED   01/30/2022 1716 02/01/2022 1859 Full Code [id number]  Lesa Rape, MD ED   12/21/2021 0306 12/22/2021 1806 Full Code [id number]  Howerter, Gattis Kass, DO ED   12/07/2021 1255 12/10/2021 2043 Full Code [id number]  Lena Qualia, MD ED   09/14/2021 0009 09/16/2021 2101 Full Code [id number]  Aura Leeds Nenzel, DO Inpatient   09/26/2020 1040 10/07/2020 2034 Full Code [id number]  Lorita Rosa, MD ED       Home/SNF/Other Home  Chief Complaint DKA (diabetic ketoacidosis) (HCC) [E11.10]  Level of Care/Admitting Diagnosis ED Disposition     ED Disposition  Admit   Condition  --   Comment  Hospital Area: [facility] [[id number]]  Level of Care: Stepdown [14]  Admit to SDU based on following criteria:  Severe physiological/psychological symptoms:  Any diagnosis requiring assessment & intervention at least every 4 hours on an ongoing basis to obtain desired patient outcomes including stability and rehabilitation  May admit patient to [facility] or [facility] if equivalent level of care is available:: Yes  Covid Evaluation: Asymptomatic - no recent exposure (last 10 days) testing not required  Diagnosis: DKA (diabetic ketoacidosis) [facility]) [[id number]]  Admitting Physician: Gaylin Ke [[id number]]  Attending Physician: Cüneyt.Cox, MIR Hart.Gula [[id number]]  Certification:: I certify this patient will need inpatient services for at least 2 midnights  Expected Medical Readiness: 03/31/2024          Medical History Past Medical History:  Diagnosis Date   AKI (acute kidney injury) (HCC) 12/21/2021   Anxiety    Diabetes (HCC)    Pancreatitis    Splenic vein thrombosis     Allergies Allergies  Allergen Reactions   Other Anaphylaxis and Other (See Comments)    [name] - anaphylaxis    IV Location/Drains/Wounds Patient Lines/Drains/Airways Status     Active Line/Drains/Airways     Name Placement date Placement time Site Days   Peripheral IV 01/05/24 20 G Anterior;Right Hand 01/05/24  1317  Hand  82   Peripheral IV 03/27/24 Right Antecubital 03/27/24  1628  Antecubital  less than 1   Peripheral IV 03/27/24 20 G 1.88" Anterior;Left;Upper Arm 03/27/24  1835  Arm  less than 1            Labs/Imaging Results for orders placed or performed during the hospital encounter of 03/27/24 (from the past 48 hours)  CBG monitoring, ED     Status: Abnormal   Collection Time: 03/27/24  3:48 PM  Result Value Ref Range   Glucose-Capillary 514 (HH) 70 - 99 mg/dL    Comment: Glucose reference range applies only to samples taken after fasting for at least 8 hours.   Comment 1 Notify RN   CBC with Differential     Status: Abnormal   Collection Time: 03/27/24  4:08 PM  Result Value Ref Range   WBC  14.3 (H) 4.0 - 10.5 K/uL   RBC 4.53 4.22 - 5.81 MIL/uL   Hemoglobin 16.6 13.0 - 17.0 g/dL   HCT 09.8 11.9 - 14.7 %   MCV 111.3 (H) 80.0 - 100.0 fL   MCH 36.6 (H) 26.0 - 34.0 pg   MCHC 32.9 30.0 - 36.0 g/dL   RDW 82.9 56.2 - 13.0 %   Platelets 284 150 - 400 K/uL   nRBC 0.0 0.0 - 0.2 %   Neutrophils Relative % 89 %   Neutro Abs 12.8 (H) 1.7 - 7.7 K/uL   Lymphocytes Relative 3 %   Lymphs Abs 0.5 (L) 0.7 - 4.0 K/uL   Monocytes Relative 7 %   Monocytes Absolute 0.9 0.1 - 1.0 K/uL   Eosinophils Relative 0 %   Eosinophils Absolute 0.0 0.0 - 0.5 K/uL   Basophils Relative 0 %   Basophils Absolute 0.0 0.0 - 0.1 K/uL   WBC Morphology MORPHOLOGY UNREMARKABLE    RBC Morphology MORPHOLOGY UNREMARKABLE    Immature Granulocytes 1 %   Abs Immature Granulocytes 0.09 (H) 0.00 - 0.07 K/uL    Comment: Performed at [facility], [address] [place], [place] [address]  Comprehensive metabolic panel     Status: Abnormal   Collection Time: 03/27/24  4:08 PM  Result Value Ref Range   Sodium 135 135 - 145 mmol/L   Potassium 4.4 3.5 - 5.1 mmol/L   Chloride 89 (L) 98 - 111 mmol/L   CO2 <7 (L) 22 - 32 mmol/L   Glucose, Bld 515 (HH) 70 - 99 mg/dL    Comment: CRITICAL RESULT CALLED TO, READ BACK BY AND VERIFIED WITH Rosella Conn, RN [phone] 03/27/24 J. COLE Glucose reference range applies only to samples taken after fasting for at least 8 hours.    BUN 10 6 - 20 mg/dL   Creatinine, Ser 2.95 (H) 0.61 - 1.24 mg/dL   Calcium  13.6 (HH) 8.9 - 10.3 mg/dL    Comment: CRITICAL RESULT CALLED TO, READ BACK BY AND VERIFIED WITH Rosella Conn, RN [phone] 03/27/24 J. COLE   Total Protein 10.4 (H) 6.5 - 8.1 g/dL   Albumin  5.4 (H) 3.5 - 5.0 g/dL   AST 65 (H) 15 - 41 U/L   ALT 72 (H) 0 - 44 U/L   Alkaline Phosphatase 179 (H) 38 - 126 U/L   Total Bilirubin 2.2 (H) 0.0 - 1.2 mg/dL   GFR, Estimated >32 >44 mL/min    Comment: (NOTE) Calculated using the CKD-EPI Creatinine Equation (2021)    Anion gap NOT  CALCULATED 5 - 15    Comment: Performed at [facility], [address] [place],  [place] [address]  Lipase, blood     Status: Abnormal   Collection Time: 03/27/24  4:08 PM  Result Value Ref Range   Lipase 52 (H) 11 - 51 U/L    Comment: Performed at [facility], [address] [place], [place] [address]  Beta-hydroxybutyric acid     Status: Abnormal   Collection Time: 03/27/24  4:10 PM  Result Value Ref Range   Beta-Hydroxybutyric Acid >8.00 (H) 0.05 - 0.27 mmol/L    Comment: RESULT CONFIRMED BY MANUAL DILUTION Performed at [facility], [address], [place], [place] [address]   Urinalysis, Routine w reflex microscopic -Urine, Clean Catch     Status: Abnormal   Collection Time: 03/27/24  5:13 PM  Result Value Ref Range   Color, Urine STRAW (A) YELLOW   APPearance CLEAR CLEAR   Specific Gravity, Urine 1.016 1.005 - 1.030   pH 5.0 5.0 - 8.0   Glucose, UA >=500 (A) NEGATIVE mg/dL   Hgb urine dipstick MODERATE (A) NEGATIVE   Bilirubin Urine NEGATIVE NEGATIVE   Ketones, ur 80 (A) NEGATIVE mg/dL   Protein, ur 914 (A) NEGATIVE mg/dL   Nitrite NEGATIVE NEGATIVE   Leukocytes,Ua NEGATIVE NEGATIVE   RBC / HPF 0-5 0 - 5 RBC/hpf   WBC, UA 0-5 0 - 5 WBC/hpf   Bacteria, UA NONE SEEN NONE SEEN   Squamous Epithelial / HPF 0-5 0 - 5 /HPF   Mucus PRESENT    Hyaline Casts, UA PRESENT     Comment: Performed at [facility], [address] [place], [place] [address]  Blood gas, venous (at [facility])     Status: Abnormal   Collection Time: 03/27/24  5:16 PM  Result Value Ref Range   pH, Ven 6.98 (LL) 7.25 - 7.43    Comment: CRITICAL RESULT CALLED TO, READ BACK BY AND VERIFIED WITH: A. Sulema Endo, RN [phone] 03/27/24 J. COLE    pCO2, Ven 28 (L) 44 - 60 mmHg   pO2, Ven 36 32 - 45 mmHg   Bicarbonate 6.6 (L) 20.0 - 28.0 mmol/L   Acid-base deficit 24.0 (H) 0.0 - 2.0 mmol/L   O2 Saturation 51.6 %   Patient temperature 37.0      Comment: Performed at [facility], [address] [place], [place] [address]  CBG monitoring, ED     Status: Abnormal   Collection Time: 03/27/24  5:56 PM  Result Value Ref Range   Glucose-Capillary 506 (HH) 70 - 99 mg/dL    Comment: Glucose reference range applies only to samples taken after fasting for at least 8 hours.  CBG monitoring, ED     Status: Abnormal   Collection Time: 03/27/24  6:37 PM  Result Value Ref Range   Glucose-Capillary 453 (H) 70 - 99 mg/dL    Comment: Glucose reference range applies only to samples taken after fasting for at least 8 hours.  CBG monitoring, ED     Status: Abnormal   Collection Time: 03/27/24  7:25 PM  Result Value Ref Range   Glucose-Capillary 339 (H) 70 - 99 mg/dL    Comment: Glucose reference range applies only to samples taken after fasting for at least 8 hours.   DG Chest Portable 1 View Result Date: 03/27/2024 CLINICAL DATA:  DKA, vomiting and decreased body temperature. EXAM: PORTABLE CHEST 1 VIEW COMPARISON:  August 29, 2023 FINDINGS: The heart size and mediastinal contours are within normal limits. Both lungs are clear. The  visualized skeletal structures are unremarkable. IMPRESSION: No active disease. Electronically Signed   By: Virgle Grime M.D.   On: 03/27/2024 18:16    Pending Labs Unresulted Labs (From admission, onward)     Start     Ordered   03/28/24 0500  Comprehensive metabolic panel  Tomorrow morning,   R        03/27/24 1856   03/28/24 0500  CBC  Tomorrow morning,   R        03/27/24 1856   03/27/24 1858  Basic metabolic panel  Now then every 4 hours,   R (with TIMED occurrences)      03/27/24 1857   03/27/24 1856  HIV Antibody (routine testing w rflx)  (HIV Antibody (Routine testing w reflex) panel)  Once,   R        03/27/24 1856            Vitals/Pain Today's Vitals   03/27/24 1736 03/27/24 1811 03/27/24 1820 03/27/24 1859  BP:    (!) 168/114  Pulse:    (!) 118  Resp:    20  Temp:  (!) 97.5 F (36.4 C) (!) 97.5 F (36.4 C)    TempSrc:  Oral    SpO2:    100%  Weight:      Height:      PainSc:   8      Isolation Precautions No active isolations  Medications Medications  insulin  regular, human (MYXREDLIN ) 100 units/ 100 mL infusion (6.5 Units/hr Intravenous Rate/Dose Change 03/27/24 1931)  lactated ringers  infusion (has no administration in time range)  dextrose  5 % in lactated ringers  infusion (0 mLs Intravenous Hold 03/27/24 1805)  dextrose  50 % solution 0-50 mL (has no administration in time range)  potassium chloride  10 mEq in 100 mL IVPB (10 mEq Intravenous New Bag/Given 03/27/24 1841)  pantoprazole  (PROTONIX ) injection 40 mg (has no administration in time range)  gabapentin  (NEURONTIN ) capsule 100 mg (has no administration in time range)  multivitamin with minerals tablet 1 tablet (has no administration in time range)  enoxaparin  (LOVENOX ) injection 40 mg (has no administration in time range)  ibuprofen (ADVIL) tablet 400 mg (has no administration in time range)  traZODone  (DESYREL ) tablet 50 mg (has no administration in time range)  oxyCODONE  (Oxy IR/ROXICODONE ) immediate release tablet 5 mg (has no administration in time range)  HYDROmorphone  (DILAUDID ) injection 0.5-1 mg (has no administration in time range)  ondansetron  (ZOFRAN ) tablet 4 mg (has no administration in time range)    Or  ondansetron  (ZOFRAN ) injection 4 mg (has no administration in time range)  albuterol  (PROVENTIL ) (2.5 MG/3ML) 0.083% nebulizer solution 2.5 mg (has no administration in time range)  sodium chloride  0.9 % bolus 1,000 mL (1,000 mLs Intravenous New Bag/Given 03/27/24 1604)  sodium chloride  0.9 % bolus 1,000 mL (1,000 mLs Intravenous New Bag/Given 03/27/24 1721)  HYDROmorphone  (DILAUDID ) injection 0.5 mg (0.5 mg Intravenous Given 03/27/24 1600)  ondansetron  (ZOFRAN ) injection 4 mg (4 mg Intravenous Given 03/27/24 1600)  lactated ringers  bolus 1,452 mL (1,000 mLs Intravenous New  Bag/Given 03/27/24 1842)  HYDROmorphone  (DILAUDID ) injection 0.5 mg (0.5 mg Intravenous Given 03/27/24 1820)    Mobility walks

## 2024-03-27 NOTE — ED Notes (Signed)
 Report given to the floor.

## 2024-03-27 NOTE — ED Provider Notes (Signed)
 [place] EMERGENCY DEPARTMENT AT [facility] Provider Note   CSN: [id number] Arrival date & time: 03/27/24  1459     History  Chief Complaint  Patient presents with   Shortness of Breath   Abdominal Pain    Carlos [name] is a 38 y.o. male.  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  Patient states it feels like GERD and like pancreatitis.  He reports that his blood sugars have been minimally elevated, but not severely high recently.  No fevers or cough.  He does have shortness of breath.  No diarrhea. No fever.        Home Medications Prior to Admission medications   Medication Sig Start Date End Date Taking? Authorizing Provider  buprenorphine  (BUTRANS ) 7.5 MCG/HR Place 1 patch onto the skin once a week. Patient not taking: Reported on 02/16/2024 10/10/23     Continuous Glucose Sensor (DEXCOM G7 SENSOR) MISC 1 Device by Does not apply route as directed. Patient not taking: Reported on 02/16/2024 09/19/23   [name], Ibtehal Jaralla, MD  gabapentin  (NEURONTIN ) 100 MG capsule Take 1 capsule (100 mg total) by mouth 2 (two) times daily. 02/16/24   Ellene Gustin, MD  insulin  aspart (NOVOLOG  FLEXPEN) 100 UNIT/ML FlexPen Max daily 30 units 09/19/23   [name], Ibtehal Jaralla, MD  insulin  glargine (LANTUS  SOLOSTAR) 100 UNIT/ML Solostar Pen Inject 16 Units into the skin daily. 09/19/23   [name], Ibtehal Jaralla, MD  Insulin  Pen Needle 32G X 4 MM MISC 1 Device by Does not apply route in the morning, at noon, in the evening, and at bedtime. 09/19/23   [name], Ibtehal Jaralla, MD  lipase/protease/amylase (CREON ) 36000 UNITS CPEP capsule Take 1-2 capsules (36,000-72,000  Units total) by mouth See admin instructions. Take 72,000 units by mouth three times a day before meals and 36,000 units with any snacks 01/25/24   [name], Gabriel Jr., MD  multivitamin (ONE-A-DAY MEN'S) TABS tablet Take 1 tablet by mouth daily with breakfast. 01/12/24   [name], Dinah C, NP  naloxone  (NARCAN ) nasal spray 4 mg/0.1 mL Place 1 spray into the nose once as needed (Overdose). 04/05/23   [provider]  omeprazole  (PRILOSEC) 40 MG capsule TAKE 1 CAPSULE (40 MG TOTAL) BY MOUTH DAILY. 10/31/23   [name], Dinah C, NP      Allergies    Other    Review of Systems   Review of Systems  Physical Exam Updated Vital Signs BP (!) 148/108 (BP Location: Left Arm)   Pulse (!) 128   Resp 18   SpO2 100%   Physical Exam Vitals and nursing note reviewed.  Constitutional:      General: He is not in acute distress.    Appearance: He is well-developed.  HENT:     Head: Normocephalic and atraumatic.     Mouth/Throat:     Mouth: Mucous membranes are dry.  Eyes:     General:        Right eye: No discharge.        Left eye: No discharge.     Conjunctiva/sclera: Conjunctivae normal.  Cardiovascular:     Rate and Rhythm: Regular rhythm. Tachycardia present.     Heart sounds: Normal heart sounds.  Pulmonary:  Effort: Pulmonary effort is normal.     Breath sounds: Normal breath sounds.  Abdominal:     Palpations: Abdomen is soft.     Tenderness: There is generalized abdominal tenderness. There is no guarding or rebound.  Musculoskeletal:     Cervical back: Normal range of motion and neck supple.  Skin:    General: Skin is warm and dry.  Neurological:     Mental Status: He is alert.     ED Results / Procedures / Treatments   Labs (all labs ordered are listed, but only abnormal results are displayed) Labs Reviewed  CBC WITH DIFFERENTIAL/PLATELET - Abnormal; Notable for the following components:      Result Value   WBC 14.3 (*)    MCV 111.3 (*)    MCH 36.6 (*)     Neutro Abs 12.8 (*)    Lymphs Abs 0.5 (*)    Abs Immature Granulocytes 0.09 (*)    All other components within normal limits  COMPREHENSIVE METABOLIC PANEL WITH GFR - Abnormal; Notable for the following components:   Chloride 89 (*)    CO2 <7 (*)    Glucose, Bld 515 (*)    Creatinine, Ser 1.41 (*)    Calcium  13.6 (*)    Total Protein 10.4 (*)    Albumin  5.4 (*)    AST 65 (*)    ALT 72 (*)    Alkaline Phosphatase 179 (*)    Total Bilirubin 2.2 (*)    All other components within normal limits  LIPASE, BLOOD - Abnormal; Notable for the following components:   Lipase 52 (*)    All other components within normal limits  URINALYSIS, ROUTINE W REFLEX MICROSCOPIC - Abnormal; Notable for the following components:   Color, Urine STRAW (*)    Glucose, UA >=500 (*)    Hgb urine dipstick MODERATE (*)    Ketones, ur 80 (*)    Protein, ur 100 (*)    All other components within normal limits  BETA-HYDROXYBUTYRIC ACID - Abnormal; Notable for the following components:   Beta-Hydroxybutyric Acid >8.00 (*)    All other components within normal limits  BLOOD GAS, VENOUS - Abnormal; Notable for the following components:   pH, Ven 6.98 (*)    pCO2, Ven 28 (*)    Bicarbonate 6.6 (*)    Acid-base deficit 24.0 (*)    All other components within normal limits  CBG MONITORING, ED - Abnormal; Notable for the following components:   Glucose-Capillary 514 (*)    All other components within normal limits  I-STAT VENOUS BLOOD GAS, ED    ED ECG REPORT   Date: 03/27/2024  Rate: 130  Rhythm: sinus tachycardia  QRS Axis: normal  Intervals: normal, borderline prolonged qtc  ST/T Wave abnormalities: normal  Conduction Disutrbances:none  Narrative Interpretation:   Old EKG Reviewed: changes noted faster 08/2023  I have personally reviewed the EKG tracing and agree with the computerized printout as noted.   Radiology DG Chest Portable 1 View Result Date: 03/27/2024 CLINICAL DATA:  DKA, vomiting and  decreased body temperature. EXAM: PORTABLE CHEST 1 VIEW COMPARISON:  August 29, 2023 FINDINGS: The heart size and mediastinal contours are within normal limits. Both lungs are clear. The visualized skeletal structures are unremarkable. IMPRESSION: No active disease. Electronically Signed   By: Virgle Grime M.D.   On: 03/27/2024 18:16    Procedures Procedures    Medications Ordered in ED Medications  insulin  regular, human (MYXREDLIN ) 100 units/ 100 mL  infusion (8.5 Units/hr Intravenous New Bag/Given 03/27/24 1804)  lactated ringers  infusion (has no administration in time range)  dextrose  5 % in lactated ringers  infusion (0 mLs Intravenous Hold 03/27/24 1805)  dextrose  50 % solution 0-50 mL (has no administration in time range)  potassium chloride  10 mEq in 100 mL IVPB (10 mEq Intravenous New Bag/Given 03/27/24 1841)  sodium chloride  0.9 % bolus 1,000 mL (1,000 mLs Intravenous New Bag/Given 03/27/24 1604)  sodium chloride  0.9 % bolus 1,000 mL (1,000 mLs Intravenous New Bag/Given 03/27/24 1721)  HYDROmorphone  (DILAUDID ) injection 0.5 mg (0.5 mg Intravenous Given 03/27/24 1600)  ondansetron  (ZOFRAN ) injection 4 mg (4 mg Intravenous Given 03/27/24 1600)  lactated ringers  bolus 1,452 mL (1,000 mLs Intravenous New Bag/Given 03/27/24 1842)  HYDROmorphone  (DILAUDID ) injection 0.5 mg (0.5 mg Intravenous Given 03/27/24 1820)    ED Course/ Medical Decision Making/ A&P    Patient seen and examined. History obtained directly from patient.  Reviewed most recent hospitalization notes from the end of 2024.   Labs/EKG: Ordered CBC, CMP, lipase, UA, CBG.  EKG.  Imaging: None ordered, but will likely need imaging of the abdomen pelvis.  Medications/Fluids: Ordered: IV fluid bolus  Most recent vital signs reviewed and are as follows: BP (!) 148/108 (BP Location: Left Arm)   Pulse (!) 128   Resp 18   SpO2 100%   Initial impression: Recurrent vomiting in setting of history of pancreatitis, pancreatic  and biliary stents, intussusception, diabetes.  5:25 PM Reassessment performed. Patient appears stable.  Labs personally reviewed and interpreted including: CBC with elevated white blood cell count of 14.3; CMP glucose 515, sodium corrects to the low 140s; creatinine 1.41 with normal BUN; calcium  13.6; mild elevation of liver enzymes and T. bili; lipase 52; blood gas pH 6.98 with bicarb of 6.6.  Imaging personally visualized and interpreted including: Chest x-ray, awaiting read, no obvious pneumonia.  Reviewed pertinent lab work and imaging with patient at bedside. Questions answered.   Pt discussed with and seen by Dr. Linder Revere.   Most current vital signs reviewed and are as follows: BP (!) 148/108 (BP Location: Left Arm)   Pulse (!) 128   Resp 18   SpO2 100%   Plan: IV insulin , continue IV fluids potassium repletion ordered.  6:17 PM Reassessment performed. Patient appears stable.  He   Reviewed pertinent lab work and imaging with patient at bedside. Questions answered.   Most current vital signs reviewed and are as follows: BP (!) 148/108 (BP Location: Left Arm)   Pulse (!) 128   Temp (!) 97.5 F (36.4 C) (Oral)   Resp 18   Ht 6\' 5"  (1.956 m)   Wt 72.6 kg   SpO2 100%   BMI 18.97 kg/m   Plan: Admit to hospital.  Of note, initial temperature documented at 56 F, likely inaccurate.  Rechecked and it was 97.5 F orally.  6:54 PM Discussed case with Dr. Lowell Rude with [facility] who will see and admit.  Given patient's various intra-abdominal problems in the past, requests CT abdomen pelvis which was ordered.  CRITICAL CARE Performed by: Lyna Sandhoff PA-C Total critical care time: 45 minutes Critical care time was exclusive of separately billable procedures and treating other patients. Critical care was necessary to treat or prevent imminent or life-threatening deterioration. Critical care was time spent personally by me on the following activities: development of treatment plan  with patient and/or surrogate as well as nursing, discussions with consultants, evaluation of patient's response to treatment, examination of  patient, obtaining history from patient or surrogate, ordering and performing treatments and interventions, ordering and review of laboratory studies, ordering and review of radiographic studies, pulse oximetry and re-evaluation of patient's condition.                                  Medical Decision Making Amount and/or Complexity of Data Reviewed Labs: ordered. Radiology: ordered.  Risk Prescription drug management. Decision regarding hospitalization.   Patient with moderate/severe DKA, no altered level of consciousness.  Patient receiving IV fluids for volume repletion, electrolyte repletion, IV insulin .  He will need to be admitted to the hospital.         Final Clinical Impression(s) / ED Diagnoses Final diagnoses:  Diabetic ketoacidosis without coma associated with type 1 diabetes mellitus (HCC)  Pain of upper abdomen    Rx / DC Orders ED Discharge Orders     None         Lyna Sandhoff, PA-C 03/27/24 1855    Tegeler, Marine Sia, MD 03/28/24 [phone]

## 2024-03-27 NOTE — H&P (Signed)
 History and Physical  Carlos [name] JYN:[id number] DOB: 03-18-1986 DOA: 03/27/2024  PCP: Estil Heman, NP   Chief Complaint: Abdominal pain, vomiting  HPI: Carlos [name] is a 38 y.o. male with medical history significant for gastroparesis, insulin -dependent type 1 diabetes, anxiety, chronic pancreatitis being admitted to the hospital with right upper quadrant abdominal pain, vomiting, and diabetic ketoacidosis.  Patient denies any fevers, says that he has had chronic right upper quadrant abdominal pain which has been unrelenting since he had his biliary stent removed in February of this year.  Starting a couple of nights ago, he started having intractable nausea and vomiting.  Unable to keep down liquids, anytime he takes p.o. he vomits.  States he has been having normal bowel movements, denies fevers, chest pain.  Pain feels typical of his gallbladder pain.  States he does not feel like he has much pancreatitis pain.  Review of Systems: Please see HPI for pertinent positives and negatives. A complete 10 system review of systems are otherwise negative.  Past Medical History:  Diagnosis Date   AKI (acute kidney injury) (HCC) 12/21/2021   Anxiety    Diabetes (HCC)    Pancreatitis    Splenic vein thrombosis    Past Surgical History:  Procedure Laterality Date   BILIARY BRUSHING  02/02/2023   Procedure: BILIARY BRUSHING;  Surgeon: Brice Campi Albino Alu., MD;  Location: [facility];  Service: Gastroenterology;;   BILIARY BRUSHING  04/28/2023   Procedure: BILIARY BRUSHING;  Surgeon: Asencion Blacksmith, MD;  Location: [facility] ENDOSCOPY;  Service: Gastroenterology;;   BILIARY BRUSHING  06/02/2023   Procedure: BILIARY BRUSHING;  Surgeon: Normie Becton., MD;  Location: [facility];  Service: Gastroenterology;;   BILIARY BRUSHING  01/05/2024   Procedure: BILIARY BRUSHING;  Surgeon: Normie Becton., MD;  Location: [facility];  Service: Gastroenterology;;   BILIARY DILATION  12/01/2022    Procedure: BILIARY DILATION;  Surgeon: Normie Becton., MD;  Location: [facility];  Service: Gastroenterology;;   BILIARY DILATION  02/02/2023   Procedure: BILIARY DILATION;  Surgeon: Normie Becton., MD;  Location: [facility];  Service: Gastroenterology;;   BILIARY DILATION  01/05/2024   Procedure: BILIARY DILATION;  Surgeon: Normie Becton., MD;  Location: [facility];  Service: Gastroenterology;;   BILIARY STENT PLACEMENT  04/28/2023   Procedure: BILIARY STENT PLACEMENT;  Surgeon: Asencion Blacksmith, MD;  Location: [facility] ENDOSCOPY;  Service: Gastroenterology;;   BILIARY STENT PLACEMENT N/A 06/02/2023   Procedure: BILIARY STENT PLACEMENT;  Surgeon: Normie Becton., MD;  Location: [facility] ENDOSCOPY;  Service: Gastroenterology;  Laterality: N/A;   BIOPSY  12/02/2021   Procedure: BIOPSY;  Surgeon: Brice Campi Albino Alu., MD;  Location: [facility];  Service: Gastroenterology;;   ENDOSCOPIC RETROGRADE CHOLANGIOPANCREATOGRAPHY (ERCP) WITH PROPOFOL  N/A 08/12/2022   Procedure: ENDOSCOPIC RETROGRADE CHOLANGIOPANCREATOGRAPHY (ERCP) WITH PROPOFOL ;  Surgeon: Brice Campi Albino Alu., MD;  Location: [facility] ENDOSCOPY;  Service: Gastroenterology;  Laterality: N/A;   ENDOSCOPIC RETROGRADE CHOLANGIOPANCREATOGRAPHY (ERCP) WITH PROPOFOL  N/A 12/01/2022   Procedure: ENDOSCOPIC RETROGRADE CHOLANGIOPANCREATOGRAPHY (ERCP) WITH PROPOFOL ;  Surgeon: Brice Campi Albino Alu., MD;  Location: [facility] ENDOSCOPY;  Service: Gastroenterology;  Laterality: N/A;   ENDOSCOPIC RETROGRADE CHOLANGIOPANCREATOGRAPHY (ERCP) WITH PROPOFOL  N/A 02/02/2023   Procedure: ENDOSCOPIC RETROGRADE CHOLANGIOPANCREATOGRAPHY (ERCP) WITH PROPOFOL ;  Surgeon: Brice Campi Albino Alu., MD;  Location: [facility] ENDOSCOPY;  Service: Gastroenterology;  Laterality: N/A;   ENDOSCOPIC RETROGRADE CHOLANGIOPANCREATOGRAPHY (ERCP) WITH PROPOFOL  N/A 06/02/2023   Procedure: ENDOSCOPIC RETROGRADE CHOLANGIOPANCREATOGRAPHY (ERCP) WITH PROPOFOL ;  Surgeon:  Brice Campi Albino Alu., MD;  Location: [facility] ENDOSCOPY;  Service:  Gastroenterology;  Laterality: N/A;   ENDOSCOPIC RETROGRADE CHOLANGIOPANCREATOGRAPHY (ERCP) WITH PROPOFOL  N/A 01/05/2024   Procedure: ENDOSCOPIC RETROGRADE CHOLANGIOPANCREATOGRAPHY (ERCP) WITH PROPOFOL ;  Surgeon: Brice Campi Albino Alu., MD;  Location: [facility] ENDOSCOPY;  Service: Gastroenterology;  Laterality: N/A;   ERCP N/A 04/28/2023   Procedure: ENDOSCOPIC RETROGRADE CHOLANGIOPANCREATOGRAPHY (ERCP);  Surgeon: Asencion Blacksmith, MD;  Location: [facility] ENDOSCOPY;  Service: Gastroenterology;  Laterality: N/A;   ESOPHAGOGASTRODUODENOSCOPY N/A 12/01/2022   Procedure: ESOPHAGOGASTRODUODENOSCOPY (EGD);  Surgeon: Normie Becton., MD;  Location: [facility];  Service: Gastroenterology;  Laterality: N/A;   ESOPHAGOGASTRODUODENOSCOPY (EGD) WITH PROPOFOL  N/A 12/02/2021   Procedure: ESOPHAGOGASTRODUODENOSCOPY (EGD) WITH PROPOFOL ;  Surgeon: Brice Campi Albino Alu., MD;  Location: [facility] ENDOSCOPY;  Service: Gastroenterology;  Laterality: N/A;   ESOPHAGOGASTRODUODENOSCOPY (EGD) WITH PROPOFOL  N/A 07/09/2023   Procedure: ESOPHAGOGASTRODUODENOSCOPY (EGD) WITH PROPOFOL ;  Surgeon: Tobin Forts, MD;  Location: [facility] ENDOSCOPY;  Service: Gastroenterology;  Laterality: N/A;  needs ercp scope for stent pull   FINE NEEDLE ASPIRATION N/A 12/01/2022   Procedure: FINE NEEDLE ASPIRATION (FNA) LINEAR;  Surgeon: Normie Becton., MD;  Location: [facility] ENDOSCOPY;  Service: Gastroenterology;  Laterality: N/A;   NEUROLYTIC CELIAC PLEXUS  12/01/2022   Procedure: CELIAC PLEXUS BLOCK;  Surgeon: Brice Campi, Albino Alu., MD;  Location: [facility];  Service: Gastroenterology;;   PANCREATIC STENT PLACEMENT  08/12/2022   Procedure: PANCREATIC STENT PLACEMENT;  Surgeon: Normie Becton., MD;  Location: [facility];  Service: Gastroenterology;;   PANCREATIC STENT PLACEMENT  12/01/2022   Procedure: PANCREATIC STENT PLACEMENT;  Surgeon: Normie Becton., MD;   Location: [facility];  Service: Gastroenterology;;   PANCREATIC STENT PLACEMENT  02/02/2023   Procedure: PANCREATIC STENT PLACEMENT;  Surgeon: Normie Becton., MD;  Location: [facility];  Service: Gastroenterology;;   PANCREATIC STENT PLACEMENT  06/02/2023   Procedure: PANCREATIC STENT PLACEMENT;  Surgeon: Normie Becton., MD;  Location: [facility];  Service: Gastroenterology;;   REMOVAL OF STONES  12/01/2022   Procedure: REMOVAL OF STONES;  Surgeon: Normie Becton., MD;  Location: [facility];  Service: Gastroenterology;;   REMOVAL OF STONES  02/02/2023   Procedure: REMOVAL OF STONES;  Surgeon: Normie Becton., MD;  Location: [facility];  Service: Gastroenterology;;   REMOVAL OF STONES  06/02/2023   Procedure: REMOVAL OF STONES;  Surgeon: Normie Becton., MD;  Location: [facility];  Service: Gastroenterology;;   REMOVAL OF STONES  01/05/2024   Procedure: REMOVAL OF SLUDGE;  Surgeon: Normie Becton., MD;  Location: [facility];  Service: Gastroenterology;;   [address]  08/12/2022   Procedure: [address];  Surgeon: Normie Becton., MD;  Location: [facility];  Service: Gastroenterology;;   [address]  12/01/2022   Procedure: [address];  Surgeon: Normie Becton., MD;  Location: [facility];  Service: Gastroenterology;;   [address]  06/02/2023   Procedure: [address];  Surgeon: Normie Becton., MD;  Location: [facility];  Service: Gastroenterology;;   [address] REMOVAL  12/01/2022   Procedure: STENT REMOVAL;  Surgeon: Normie Becton., MD;  Location: [facility];  Service: Gastroenterology;;   [address] REMOVAL  02/02/2023   Procedure: STENT REMOVAL;  Surgeon: Normie Becton., MD;  Location: [facility];  Service: Gastroenterology;;   [address] REMOVAL  06/02/2023   Procedure: STENT REMOVAL;  Surgeon: Normie Becton., MD;  Location: [facility];  Service:  Gastroenterology;;   [address] REMOVAL  07/09/2023   Procedure: STENT REMOVAL;  Surgeon: Tobin Forts, MD;  Location: [facility];  Service: Gastroenterology;;   [address] REMOVAL  01/05/2024   Procedure:  STENT REMOVAL;  Surgeon: Brice Campi Albino Alu., MD;  Location: [facility];  Service: Gastroenterology;;   UPPER ESOPHAGEAL ENDOSCOPIC ULTRASOUND (EUS) N/A 12/02/2021   Procedure: UPPER ESOPHAGEAL ENDOSCOPIC ULTRASOUND (EUS);  Surgeon: Normie Becton., MD;  Location: [facility];  Service: Gastroenterology;  Laterality: N/A;   UPPER ESOPHAGEAL ENDOSCOPIC ULTRASOUND (EUS) N/A 12/01/2022   Procedure: UPPER ESOPHAGEAL ENDOSCOPIC ULTRASOUND (EUS);  Surgeon: Normie Becton., MD;  Location: [facility];  Service: Gastroenterology;  Laterality: N/A;   Social History:  reports that he has been smoking cigars. He started smoking about 21 years ago. He has never used smokeless tobacco. He reports that he does not currently use alcohol. He reports that he does not use drugs.  Allergies  Allergen Reactions   Other Anaphylaxis and Other (See Comments)    [name] - anaphylaxis    Family History  Problem Relation Age of Onset   Hypertension Mother    Alcoholism Maternal Uncle    Diabetes Maternal Grandmother    Diabetes Maternal Grandfather    Diabetes Paternal Grandmother    Diabetes Paternal Grandfather    Colon cancer Neg Hx    Esophageal cancer Neg Hx    Inflammatory bowel disease Neg Hx    Liver disease Neg Hx    Pancreatic cancer Neg Hx    Rectal cancer Neg Hx    Stomach cancer Neg Hx      Prior to Admission medications   Medication Sig Start Date End Date Taking? Authorizing Provider  gabapentin  (NEURONTIN ) 100 MG capsule Take 1 capsule (100 mg total) by mouth 2 (two) times daily. 02/16/24  Yes Ellene Gustin, MD  insulin  aspart (NOVOLOG  FLEXPEN) 100 UNIT/ML FlexPen Max daily 30 units 09/19/23  Yes [name], Ibtehal Jaralla, MD  insulin  glargine (LANTUS  SOLOSTAR) 100  UNIT/ML Solostar Pen Inject 16 Units into the skin daily. 09/19/23  Yes [name], Ibtehal Jaralla, MD  lipase/protease/amylase (CREON ) 36000 UNITS CPEP capsule Take 1-2 capsules (36,000-72,000 Units total) by mouth See admin instructions. Take 72,000 units by mouth three times a day before meals and 36,000 units with any snacks 01/25/24  Yes [name], Gabriel Jr., MD  multivitamin (ONE-A-DAY MEN'S) TABS tablet Take 1 tablet by mouth daily with breakfast. 01/12/24  Yes [name], Dinah C, NP  naloxone  (NARCAN ) nasal spray 4 mg/0.1 mL Place 1 spray into the nose once as needed (Overdose). 04/05/23  Yes [provider]  omeprazole  (PRILOSEC) 40 MG capsule TAKE 1 CAPSULE (40 MG TOTAL) BY MOUTH DAILY. 10/31/23  Yes [name], Dinah C, NP  buprenorphine  (BUTRANS ) 7.5 MCG/HR Place 1 patch onto the skin once a week. Patient not taking: Reported on 02/16/2024 10/10/23     Continuous Glucose Sensor (DEXCOM G7 SENSOR) MISC 1 Device by Does not apply route as directed. Patient not taking: Reported on 02/16/2024 09/19/23   [name], Julian Obey, MD  Insulin  Pen Needle 32G X 4 MM MISC 1 Device by Does not apply route in the morning, at noon, in the evening, and at bedtime. 09/19/23   [name], Julian Obey, MD    Physical Exam: BP (!) 148/108 (BP Location: Left Arm)   Pulse (!) 128   Temp (!) 97.5 F (36.4 C) (Oral)   Resp 18   Ht 6\' 5"  (1.956 m)   Wt 72.6 kg   SpO2 100%   BMI 18.97 kg/m  General:  Alert, oriented, calm, in no acute distress, lying on his side looks tired and dry, but looks somewhat comfortable Cardiovascular: RRR, no murmurs or  rubs, no peripheral edema  Respiratory: clear to auscultation bilaterally, no wheezes, no crackles  Abdomen: soft, tender, nondistended, normal bowel tones heard  Skin: dry, no rashes  Musculoskeletal: no joint effusions, normal range of motion  Psychiatric: appropriate affect, normal speech  Neurologic: extraocular muscles intact, clear speech,  moving all extremities with intact sensorium         Labs on Admission:  Basic Metabolic Panel: Recent Labs  Lab 03/27/24 1608  NA 135  K 4.4  CL 89*  CO2 <7*  GLUCOSE 515*  BUN 10  CREATININE 1.41*  CALCIUM  13.6*   Liver Function Tests: Recent Labs  Lab 03/27/24 1608  AST 65*  ALT 72*  ALKPHOS 179*  BILITOT 2.2*  PROT 10.4*  ALBUMIN  5.4*   Recent Labs  Lab 03/27/24 1608  LIPASE 52*   No results for input(s): "AMMONIA" in the last 168 hours. CBC: Recent Labs  Lab 03/27/24 1608  WBC 14.3*  NEUTROABS 12.8*  HGB 16.6  HCT 50.4  MCV 111.3*  PLT 284   Cardiac Enzymes: No results for input(s): "CKTOTAL", "CKMB", "CKMBINDEX", "TROPONINI" in the last 168 hours. BNP (last 3 results) No results for input(s): "BNP" in the last 8760 hours.  ProBNP (last 3 results) No results for input(s): "PROBNP" in the last 8760 hours.  CBG: Recent Labs  Lab 03/27/24 1548 03/27/24 1756 03/27/24 1837  GLUCAP 514* 506* 453*    Radiological Exams on Admission: DG Chest Portable 1 View Result Date: 03/27/2024 CLINICAL DATA:  DKA, vomiting and decreased body temperature. EXAM: PORTABLE CHEST 1 VIEW COMPARISON:  August 29, 2023 FINDINGS: The heart size and mediastinal contours are within normal limits. Both lungs are clear. The visualized skeletal structures are unremarkable. IMPRESSION: No active disease. Electronically Signed   By: Virgle Grime M.D.   On: 03/27/2024 18:16   Assessment/Plan Carlos [name] is a 38 y.o. male with medical history significant for gastroparesis, insulin -dependent type 1 diabetes, anxiety, chronic pancreatitis being admitted to the hospital with right upper quadrant abdominal pain, vomiting, and diabetic ketoacidosis.   Diabetic ketoacidosis-likely due to his intractable nausea and vomiting, resulting in relative dehydration and starvation leading to ketosis.  Patient is hemodynamically stable, and despite significant metabolic acidosis mental status  is completely normal. -Inpatient admission to stepdown unit -N.p.o. -IV insulin  drip -Aggressive fluid resuscitation per DKA protocol -Follow BMP every 4 hours  Intractable nausea and vomiting-etiology currently unclear, could be related to his known gastroparesis, however will need to rule out other potential causes such as acute pancreatitis, worsening pancreatic pseudocyst, intra-abdominal abscess, bowel obstruction etc. although these are not highly suspected. -Discussed with ER provider, who have ordered stat CT abdomen pelvis with contrast -Supportive care for now with pain and nausea control as needed -Will consider general surgery or GI consultation as indicated, pending imaging  Type 1 diabetes-treat hyperglycemia and DKA as above  Abnormal LFTs-these are chronically elevated, and appear to be at baseline  History of alcohol abuse-denies active drinking, monitor closely for any signs or symptoms of alcohol withdrawal  DVT prophylaxis: Lovenox      Code Status: Full Code  Consults called: None  Admission status: The appropriate patient status for this patient is INPATIENT. Inpatient status is judged to be reasonable and necessary in order to provide the required intensity of service to ensure the patient's safety. The patient's presenting symptoms, physical exam findings, and initial radiographic and laboratory data in the context of their chronic comorbidities is felt to place them at  high risk for further clinical deterioration. Furthermore, it is not anticipated that the patient will be medically stable for discharge from the hospital within 2 midnights of admission.    I certify that at the point of admission it is my clinical judgment that the patient will require inpatient hospital care spanning beyond 2 midnights from the point of admission due to high intensity of service, high risk for further deterioration and high frequency of surveillance required  Time spent: 59  minutes  Tejas Seawood Rickey Charm MD [facility] Pager [phone]  If 7PM-7AM, please contact night-coverage [email or web address] Password [facility]  03/27/2024, 6:57 PM

## 2024-03-27 NOTE — ED Notes (Signed)
 Unable to get IV access, pt states U/S may be needed

## 2024-03-27 NOTE — ED Triage Notes (Addendum)
 Pt BIBA from home for shortness of breath, RuQ pain and vomiting. Pt states it feels like his GERD,  Hx of pancreatitis and recent removal of gall bladder stent.

## 2024-03-28 ENCOUNTER — Inpatient Hospital Stay (HOSPITAL_COMMUNITY)

## 2024-03-28 ENCOUNTER — Encounter (HOSPITAL_COMMUNITY): Payer: Self-pay | Admitting: Internal Medicine

## 2024-03-28 DIAGNOSIS — E1010 Type 1 diabetes mellitus with ketoacidosis without coma: Secondary | ICD-10-CM | POA: Diagnosis not present

## 2024-03-28 DIAGNOSIS — R1010 Upper abdominal pain, unspecified: Secondary | ICD-10-CM | POA: Diagnosis not present

## 2024-03-28 LAB — BETA-HYDROXYBUTYRIC ACID
Beta-Hydroxybutyric Acid: 2.67 mmol/L — ABNORMAL HIGH (ref 0.05–0.27)
Beta-Hydroxybutyric Acid: 1.53 mmol/L — ABNORMAL HIGH (ref 0.05–0.27)
Beta-Hydroxybutyric Acid: 1.03 mmol/L — ABNORMAL HIGH (ref 0.05–0.27)

## 2024-03-28 LAB — BASIC METABOLIC PANEL WITH GFR
Anion gap: 9 (ref 5–15)
Anion gap: 8 (ref 5–15)
BUN: 8 mg/dL (ref 6–20)
BUN: 6 mg/dL (ref 6–20)
CO2: 20 mmol/L — ABNORMAL LOW (ref 22–32)
CO2: 22 mmol/L (ref 22–32)
Calcium: 10.3 mg/dL (ref 8.9–10.3)
Calcium: 10.3 mg/dL (ref 8.9–10.3)
Chloride: 104 mmol/L (ref 98–111)
Chloride: 104 mmol/L (ref 98–111)
Creatinine, Ser: 0.51 mg/dL — ABNORMAL LOW (ref 0.61–1.24)
Creatinine, Ser: 0.59 mg/dL — ABNORMAL LOW (ref 0.61–1.24)
GFR, Estimated: >60 mL/min (ref >60)
GFR, Estimated: >60 mL/min (ref >60)
Glucose, Bld: 178 mg/dL — ABNORMAL HIGH (ref 70–99)
Glucose, Bld: 134 mg/dL — ABNORMAL HIGH (ref 70–99)
Potassium: 3.3 mmol/L — ABNORMAL LOW (ref 3.5–5.1)
Potassium: 3.3 mmol/L — ABNORMAL LOW (ref 3.5–5.1)
Sodium: 133 mmol/L — ABNORMAL LOW (ref 135–145)
Sodium: 134 mmol/L — ABNORMAL LOW (ref 135–145)

## 2024-03-28 LAB — CBC
HCT: 39 % (ref 39.0–52.0)
Hemoglobin: 12.7 g/dL — ABNORMAL LOW (ref 13.0–17.0)
MCH: 36.2 pg — ABNORMAL HIGH (ref 26.0–34.0)
MCHC: 32.6 g/dL (ref 30.0–36.0)
MCV: 111.1 fL — ABNORMAL HIGH (ref 80.0–100.0)
Platelets: 158 10*3/uL (ref 150–400)
RBC: 3.51 MIL/uL — ABNORMAL LOW (ref 4.22–5.81)
RDW: 11.9 % (ref 11.5–15.5)
WBC: 9.3 10*3/uL (ref 4.0–10.5)
nRBC: 0 % (ref 0.0–0.2)

## 2024-03-28 LAB — GLUCOSE, CAPILLARY
Glucose-Capillary: 182 mg/dL — ABNORMAL HIGH (ref 70–99)
Glucose-Capillary: 159 mg/dL — ABNORMAL HIGH (ref 70–99)
Glucose-Capillary: 178 mg/dL — ABNORMAL HIGH (ref 70–99)
Glucose-Capillary: 180 mg/dL — ABNORMAL HIGH (ref 70–99)
Glucose-Capillary: 186 mg/dL — ABNORMAL HIGH (ref 70–99)
Glucose-Capillary: 172 mg/dL — ABNORMAL HIGH (ref 70–99)
Glucose-Capillary: 206 mg/dL — ABNORMAL HIGH (ref 70–99)
Glucose-Capillary: 144 mg/dL — ABNORMAL HIGH (ref 70–99)
Glucose-Capillary: 195 mg/dL — ABNORMAL HIGH (ref 70–99)
Glucose-Capillary: 195 mg/dL — ABNORMAL HIGH (ref 70–99)
Glucose-Capillary: 181 mg/dL — ABNORMAL HIGH (ref 70–99)
Glucose-Capillary: 114 mg/dL — ABNORMAL HIGH (ref 70–99)
Glucose-Capillary: 172 mg/dL — ABNORMAL HIGH (ref 70–99)
Glucose-Capillary: 211 mg/dL — ABNORMAL HIGH (ref 70–99)
Glucose-Capillary: 215 mg/dL — ABNORMAL HIGH (ref 70–99)
Glucose-Capillary: 157 mg/dL — ABNORMAL HIGH (ref 70–99)
Glucose-Capillary: 145 mg/dL — ABNORMAL HIGH (ref 70–99)
Glucose-Capillary: 281 mg/dL — ABNORMAL HIGH (ref 70–99)

## 2024-03-28 LAB — COMPREHENSIVE METABOLIC PANEL WITH GFR
ALT: 45 U/L — ABNORMAL HIGH (ref 0–44)
AST: 33 U/L (ref 15–41)
Albumin: 3.9 g/dL (ref 3.5–5.0)
Alkaline Phosphatase: 112 U/L (ref 38–126)
Anion gap: 10 (ref 5–15)
BUN: 8 mg/dL (ref 6–20)
CO2: 19 mmol/L — ABNORMAL LOW (ref 22–32)
Calcium: 10.2 mg/dL (ref 8.9–10.3)
Chloride: 104 mmol/L (ref 98–111)
Creatinine, Ser: 0.79 mg/dL (ref 0.61–1.24)
GFR, Estimated: >60 mL/min (ref >60)
Glucose, Bld: 171 mg/dL — ABNORMAL HIGH (ref 70–99)
Potassium: 3.7 mmol/L (ref 3.5–5.1)
Sodium: 133 mmol/L — ABNORMAL LOW (ref 135–145)
Total Bilirubin: 1.1 mg/dL (ref 0.0–1.2)
Total Protein: 7.1 g/dL (ref 6.5–8.1)

## 2024-03-28 LAB — HIV ANTIBODY (ROUTINE TESTING W REFLEX): HIV Screen 4th Generation wRfx: NONREACTIVE

## 2024-03-28 MED ORDER — METOPROLOL TARTRATE 5 MG/5ML IV SOLN
2.5000 mg | Freq: Three times a day (TID) | INTRAVENOUS | Status: DC
Start: 2024-03-28 — End: 2024-03-28
  Administered 2024-03-28: 2.5 mg via INTRAVENOUS
  Filled 2024-03-28: qty 5

## 2024-03-28 MED ORDER — FENTANYL CITRATE PF 50 MCG/ML IJ SOSY
25.0000 ug | PREFILLED_SYRINGE | Freq: Once | INTRAMUSCULAR | Status: AC | PRN
  Administered 2024-03-28: 25 ug via INTRAVENOUS
  Filled 2024-03-28: qty 1

## 2024-03-28 MED ORDER — INSULIN GLARGINE-YFGN 100 UNIT/ML ~~LOC~~ SOLN
10.0000 [IU] | Freq: Every day | SUBCUTANEOUS | Status: DC
  Administered 2024-03-28 – 2024-03-29 (×2): 10 [IU] via SUBCUTANEOUS
  Filled 2024-03-28 (×2): qty 0.1

## 2024-03-28 MED ORDER — POTASSIUM CHLORIDE CRYS ER 20 MEQ PO TBCR
40.0000 meq | EXTENDED_RELEASE_TABLET | Freq: Once | ORAL | Status: AC
  Administered 2024-03-28: 40 meq via ORAL
  Filled 2024-03-28: qty 2

## 2024-03-28 MED ORDER — METOPROLOL TARTRATE 5 MG/5ML IV SOLN
2.5000 mg | Freq: Three times a day (TID) | INTRAVENOUS | Status: DC
Start: 2024-03-28 — End: 2024-03-28

## 2024-03-28 MED ORDER — HYDRALAZINE HCL 25 MG PO TABS
25.0000 mg | ORAL_TABLET | Freq: Four times a day (QID) | ORAL | Status: DC | PRN
  Administered 2024-03-28: 25 mg via ORAL
  Filled 2024-03-28: qty 1

## 2024-03-28 MED ORDER — POTASSIUM CHLORIDE 20 MEQ PO PACK: 40.0000 meq | PACK | Freq: Once | ORAL | Status: DC

## 2024-03-28 MED ORDER — INSULIN ASPART 100 UNIT/ML IJ SOLN
0.0000 [IU] | Freq: Three times a day (TID) | INTRAMUSCULAR | Status: DC
  Administered 2024-03-28: 1 [IU] via SUBCUTANEOUS
  Administered 2024-03-29: 3 [IU] via SUBCUTANEOUS

## 2024-03-28 MED ORDER — CARVEDILOL 12.5 MG PO TABS
12.5000 mg | ORAL_TABLET | Freq: Two times a day (BID) | ORAL | Status: DC
  Administered 2024-03-28 – 2024-03-29 (×2): 12.5 mg via ORAL
  Filled 2024-03-28 (×2): qty 1

## 2024-03-28 NOTE — Inpatient Diabetes Management (Addendum)
 Inpatient Diabetes Program Recommendations  AACE/ADA: New Consensus Statement on Inpatient Glycemic Control (2015)  Target Ranges:  Prepandial:   less than 140 mg/dL      Peak postprandial:   less than 180 mg/dL (1-2 hours)      Critically ill patients:  140 - 180 mg/dL   Lab Results  Component Value Date   GLUCAP 195 (H) 03/28/2024   HGBA1C 7.9 (A) 09/19/2023    Review of Glycemic Control  Diabetes history: DM1 Outpatient Diabetes medications: Lantus  16 units every day, Novolog  - up to 30 units/day Current orders for Inpatient glycemic control: IV insulin  per [facility] for DKA  HgbA1C 10.9% on 09/19/2023  Inpatient Diabetes Program Recommendations:    Continue IV insulin  until BHB is normal.   Give Semglee  1-2H prior to discontinuation of drip when criteria met.  Lantus  14 units Q24H  Novolog  0-9 TID with meals and 0-5 HS  Novolog  4 units TID with meals if eating > 50%  Will speak with pt today regarding his diabetes control.   Continue to follow.  Thank you. Joni Net, RD, LDN, CDCES Inpatient Diabetes Coordinator [phone]  Addendum:  Spoke with pt at bedside in step-down unit. Pt states his blood sugar was 178 in ambulance and then it shot up to 514. On IV insulin  and transitioned to basal-bolus this afternoon at 1500. Still needs mc insulin . Pt states his phone does not support CGMs and he monitors with glucose meter at least 3x/day. Awaiting HgbA1C results.  Endo - [name]. Last OV 09/19/23.  Will f/u in am.  RV

## 2024-03-28 NOTE — Progress Notes (Addendum)
 [facility]  PROGRESS NOTE  Carlos [name] ZOX:[id number] DOB: Feb 14, 1986 DOA: 03/27/2024 PCP: Estil Heman, NP   Brief HPI:   38 y.o. male with medical history significant for gastroparesis, insulin -dependent type 1 diabetes, anxiety, chronic pancreatitis being admitted to the hospital with right upper quadrant abdominal pain, vomiting, and diabetic ketoacidosis.     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 - Discussed with [facility], no acute intervention to aspirate the cyst. -Pain control and supportive care.  Hypertension -Blood pressure has been elevated -Restart IV metoprolol  2.5 mg every 8 hours -Hydralazine  25 mg p.o. every 6 hours as needed for BP greater than 160/100 -EKG obtained today shows early repolarization changes with ST elevation, discussed with cardiologyDr Alda Amas No changes.  EKG very similar to 1 in September from last year.  Patient asymptomatic.  No intervention recommended.   Type 1 diabetes -Started on insulin  as above    Abnormal LFTs -Chronically elevated, at baseline    History of alcohol abuse -denies active drinking, monitor closely for any signs or symptoms of alcohol withdrawal    Medications     Chlorhexidine  Gluconate Cloth  6 each Topical Daily   enoxaparin  (LOVENOX ) injection  40 mg Subcutaneous Q24H   gabapentin   100 mg Oral BID   insulin  aspart  0-9 Units Subcutaneous TID WC   insulin  glargine-yfgn  10 Units  Subcutaneous Daily   metoprolol  tartrate  2.5 mg Intravenous Q8H   multivitamin with minerals  1 tablet Oral Q breakfast   pantoprazole  (PROTONIX ) IV  40 mg Intravenous Daily   potassium chloride   40 mEq Oral Once     Data Reviewed:   CBG:  Recent Labs  Lab 03/28/24 1016 03/28/24 1101 03/28/24 1150 03/28/24 1251 03/28/24 1406  GLUCAP 195* 181* 114* 172* 211*    SpO2: 99 %    Vitals:   03/28/24 1200 03/28/24 1241 03/28/24 1245 03/28/24 1406  BP: (!) 174/125 (!) 152/93  (!) 151/101  Pulse: (!) 106 (!) 106 (!) 107 (!) 106  Resp: 14 (!) 9 12 14   Temp: 97.8 F (36.6 C)     TempSrc: Axillary     SpO2: 97% 99% 99% 99%  Weight:      Height:          Data Reviewed:  Basic Metabolic Panel: Recent Labs  Lab 03/27/24 2056 03/27/24 2235 03/28/24 0328 03/28/24 0638 03/28/24 1157  NA 136 133* 133* 133* 134*  K 4.8 4.9 3.7 3.3* 3.3*  CL 89* 103 104 104 104  CO2 28 13* 19* 20* 22  GLUCOSE 211* 159* 171* 178* 134*  BUN 63* 10 8 8 6   CREATININE 7.05* 1.02 0.79 0.51* 0.59*  CALCIUM  11.1* 10.2 10.2 10.3 10.3    CBC: Recent Labs  Lab 03/27/24 1608 03/28/24 0328  WBC 14.3* 9.3  NEUTROABS 12.8*  --   HGB 16.6 12.7*  HCT 50.4 39.0  MCV 111.3* 111.1*  PLT 284 158    LFT Recent Labs  Lab 03/27/24 1608 03/28/24 0328  AST 65* 33  ALT 72*  45*  ALKPHOS 179* 112  BILITOT 2.2* 1.1  PROT 10.4* 7.1  ALBUMIN  5.4* 3.9     Antibiotics: Anti-infectives (From admission, onward)    None        DVT prophylaxis: Lovenox   Code Status: Full code  Family Communication: No family at bedside   CONSULTS    Subjective   Complains of epigastric pain.   Objective    Physical Examination:   General-appears in no acute distress Heart-S1-S2, regular, no murmur auscultated Lungs-clear to auscultation bilaterally, no wheezing or crackles auscultated Abdomen-soft, mild tenderness in epigastric region Extremities-no edema in the lower  extremities Neuro-alert, oriented x3, no focal deficit noted   Status is: Inpatient:             Carlos [name]   [facility] If 7PM-7AM, please contact night-coverage at [email or web address], Office  [phone]   03/28/2024, 3:04 PM  LOS: 1 day

## 2024-03-28 NOTE — Progress Notes (Signed)
   03/28/24 1306  TOC Brief Assessment  Insurance and Status Reviewed  Patient has primary care physician Yes  Home environment has been reviewed home  Prior level of function: independent  Prior/Current Home Services No current home services  Social Drivers of Health Review SDOH reviewed no interventions necessary  Readmission risk has been reviewed Yes  Transition of care needs no transition of care needs at this time

## 2024-03-29 DIAGNOSIS — E1010 Type 1 diabetes mellitus with ketoacidosis without coma: Secondary | ICD-10-CM | POA: Diagnosis not present

## 2024-03-29 DIAGNOSIS — I10 Essential (primary) hypertension: Secondary | ICD-10-CM | POA: Diagnosis not present

## 2024-03-29 DIAGNOSIS — R1010 Upper abdominal pain, unspecified: Secondary | ICD-10-CM | POA: Diagnosis not present

## 2024-03-29 LAB — CBC
HCT: 36.7 % — ABNORMAL LOW (ref 39.0–52.0)
Hemoglobin: 12.3 g/dL — ABNORMAL LOW (ref 13.0–17.0)
MCH: 36 pg — ABNORMAL HIGH (ref 26.0–34.0)
MCHC: 33.5 g/dL (ref 30.0–36.0)
MCV: 107.3 fL — ABNORMAL HIGH (ref 80.0–100.0)
Platelets: 144 10*3/uL — ABNORMAL LOW (ref 150–400)
RBC: 3.42 MIL/uL — ABNORMAL LOW (ref 4.22–5.81)
RDW: 11.8 % (ref 11.5–15.5)
WBC: 7.2 10*3/uL (ref 4.0–10.5)
nRBC: 0 % (ref 0.0–0.2)

## 2024-03-29 LAB — COMPREHENSIVE METABOLIC PANEL WITH GFR
ALT: 34 U/L (ref 0–44)
AST: 26 U/L (ref 15–41)
Albumin: 3.2 g/dL — ABNORMAL LOW (ref 3.5–5.0)
Alkaline Phosphatase: 97 U/L (ref 38–126)
Anion gap: 10 (ref 5–15)
BUN: <5 mg/dL — ABNORMAL LOW (ref 6–20)
CO2: 22 mmol/L (ref 22–32)
Calcium: 9.7 mg/dL (ref 8.9–10.3)
Chloride: 96 mmol/L — ABNORMAL LOW (ref 98–111)
Creatinine, Ser: 0.62 mg/dL (ref 0.61–1.24)
GFR, Estimated: >60 mL/min (ref >60)
Glucose, Bld: 206 mg/dL — ABNORMAL HIGH (ref 70–99)
Potassium: 3.7 mmol/L (ref 3.5–5.1)
Sodium: 128 mmol/L — ABNORMAL LOW (ref 135–145)
Total Bilirubin: 1.1 mg/dL (ref 0.0–1.2)
Total Protein: 6.4 g/dL — ABNORMAL LOW (ref 6.5–8.1)

## 2024-03-29 LAB — GLUCOSE, CAPILLARY
Glucose-Capillary: 233 mg/dL — ABNORMAL HIGH (ref 70–99)
Glucose-Capillary: 70 mg/dL (ref 70–99)

## 2024-03-29 MED ORDER — CARVEDILOL 12.5 MG PO TABS: 12.5000 mg | ORAL_TABLET | Freq: Two times a day (BID) | ORAL | 3 refills | Status: DC

## 2024-03-29 MED ORDER — OXYCODONE HCL 5 MG PO TABS: 5.0000 mg | ORAL_TABLET | Freq: Four times a day (QID) | ORAL | 0 refills | Status: AC | PRN

## 2024-03-29 MED ORDER — INSULIN ASPART 100 UNIT/ML IJ SOLN
3.0000 [IU] | Freq: Three times a day (TID) | INTRAMUSCULAR | Status: DC
  Administered 2024-03-29: 3 [IU] via SUBCUTANEOUS

## 2024-03-29 MED ORDER — POTASSIUM CHLORIDE CRYS ER 20 MEQ PO TBCR: 40.0000 meq | EXTENDED_RELEASE_TABLET | Freq: Once | ORAL | Status: DC

## 2024-03-29 NOTE — Plan of Care (Signed)

## 2024-03-29 NOTE — Plan of Care (Signed)

## 2024-03-29 NOTE — Progress Notes (Signed)
 [facility]  PROGRESS NOTE  Carlos [name] WUJ:[id number] DOB: Mar 21, 1986 DOA: 03/27/2024 PCP: Estil Heman, NP   Brief HPI:   38 y.o. male with medical history significant for gastroparesis, insulin -dependent type 1 diabetes, anxiety, chronic pancreatitis being admitted to the hospital with right upper quadrant abdominal pain, vomiting, and diabetic ketoacidosis.     Assessment/Plan:   Diabetic ketoacidosis -Resolved -Presented with intractable nausea vomiting, dehydration and starvation leading to ketosis -Required IV insulin , anion gap has resolved -IV insulin  has been discontinued, patient started on Lantus  and sliding scale insulin  with NovoLog  -Carb modified diet - CO2 22, beta-hydroxybutyrate 1.03   Intractable nausea and vomiting -Resolved -Likely in setting of chronic pancreatitis -CT abdomen/pelvis obtained today shows changes of chronic pancreatitis with a new small 1.9 cm retrogastric pseudocyst new from the prior study. - Discussed with [facility], no acute intervention to aspirate the cyst. -Pain control and supportive care.  Hypertension -Blood pressure has been elevated -Improved after starting Coreg 12.5 mg p.o. twice daily -Hydralazine  25 mg p.o. every 6 hours as needed for BP greater than 160/100 -EKG obtained today shows early repolarization changes with ST elevation, discussed with cardiologyDr Alda Amas No changes.  EKG very similar to 1 in September from last year.  Patient asymptomatic.  No intervention recommended.   Type 1 diabetes -Started on insulin  as above - Continue sliding scale insulin  NovoLog  Will add 3 units 3 times daily meal coverage with NovoLog     Abnormal LFTs -Chronically elevated, at baseline    History of alcohol abuse -denies active drinking, monitor closely for any signs or symptoms of alcohol withdrawal    Medications     carvedilol  12.5 mg Oral BID WC   enoxaparin  (LOVENOX ) injection  40 mg Subcutaneous Q24H    gabapentin   100 mg Oral BID   insulin  aspart  0-9 Units Subcutaneous TID WC   insulin  aspart  3 Units Subcutaneous TID WC   insulin  glargine-yfgn  10 Units Subcutaneous Daily   multivitamin with minerals  1 tablet Oral Q breakfast   pantoprazole  (PROTONIX ) IV  40 mg Intravenous Daily     Data Reviewed:   CBG:  Recent Labs  Lab 03/28/24 1608 03/28/24 1705 03/28/24 2147 03/29/24 0803 03/29/24 1138  GLUCAP 157* 145* 281* 233* 70    SpO2: 99 %    Vitals:   03/28/24 1706 03/28/24 2000 03/28/24 2122 03/29/24 0300  BP:   (!) 125/97 128/86  Pulse: (!) 110  97 88  Resp:   20 18  Temp:  98.4 F (36.9 C) 98.2 F (36.8 C) 98.2 F (36.8 C)  TempSrc:  Oral Oral Oral  SpO2:   100% 99%  Weight:      Height:          Data Reviewed:  Basic Metabolic Panel: Recent Labs  Lab 03/27/24 2235 03/28/24 0328 03/28/24 0638 03/28/24 1157 03/29/24 0554  NA 133* 133* 133* 134* 128*  K 4.9 3.7 3.3* 3.3* 3.7  CL 103 104 104 104 96*  CO2 13* 19* 20* 22 22  GLUCOSE 159* 171* 178* 134* 206*  BUN 10 8 8 6  <5*  CREATININE 1.02 0.79 0.51* 0.59* 0.62  CALCIUM  10.2 10.2 10.3 10.3 9.7    CBC: Recent Labs  Lab 03/27/24 1608 03/28/24 0328 03/29/24 0554  WBC 14.3* 9.3 7.2  NEUTROABS 12.8*  --   --   HGB 16.6 12.7* 12.3*  HCT 50.4 39.0 36.7*  MCV 111.3* 111.1* 107.3*  PLT 284 158  144*    LFT Recent Labs  Lab 03/27/24 1608 03/28/24 0328 03/29/24 0554  AST 65* 33 26  ALT 72* 45* 34  ALKPHOS 179* 112 97  BILITOT 2.2* 1.1 1.1  PROT 10.4* 7.1 6.4*  ALBUMIN  5.4* 3.9 3.2*     Antibiotics: Anti-infectives (From admission, onward)    None        DVT prophylaxis: Lovenox   Code Status: Full code  Family Communication: No family at bedside   CONSULTS    Subjective   Still complains of epigastric pain.   Objective    Physical Examination:   General-appears in no acute distress Heart-S1-S2, regular, no murmur auscultated Lungs-clear to auscultation  bilaterally, no wheezing or crackles auscultated Abdomen-soft, nontender, no organomegaly Extremities-no edema in the lower extremities Neuro-alert, oriented x3, no focal deficit noted   Status is: Inpatient:             Carlos [name]   [facility] If 7PM-7AM, please contact night-coverage at [email or web address], Office  [phone]   03/29/2024, 2:00 PM  LOS: 2 days

## 2024-03-29 NOTE — Discharge Summary (Signed)
 Physician Discharge Summary   Patient: Carlos [name] MRN: [id number] DOB: 1986/11/03  Admit date:     03/27/2024  Discharge date: 03/29/24  Discharge Physician: Ozell Blunt   PCP: Estil Heman, NP   Recommendations at discharge:   Follow-up PCP in 1 week  Discharge Diagnoses: Principal Problem:   DKA (diabetic ketoacidosis) (HCC)  Resolved Problems:   * No resolved hospital problems. *  Hospital Course:  38 y.o. male with medical history significant for gastroparesis, insulin -dependent type 1 diabetes, anxiety, chronic pancreatitis being admitted to the hospital with right upper quadrant abdominal pain, vomiting, and diabetic ketoacidosis.   Assessment and Plan:  Diabetic ketoacidosis -Resolved -Presented with intractable nausea vomiting, dehydration and starvation leading to ketosis -Required IV insulin , anion gap has resolved -IV insulin  has been discontinued, patient started on Lantus  and sliding scale insulin  with NovoLog  -Carb modified diet - CO2 22, beta-hydroxybutyrate 1.03   Intractable nausea and vomiting -Resolved -Likely in setting of chronic pancreatitis -CT abdomen/pelvis obtained today shows changes of chronic pancreatitis with a new small 1.9 cm retrogastric pseudocyst new from the prior study. - Discussed with [facility], no acute intervention to aspirate the cyst. -Pain control and supportive care.   Hypertension -Blood pressure has been elevated -Improved after starting Coreg  12.5 mg p.o. twice daily -EKG obtained today shows early repolarization changes with ST elevation, discussed with cardiologyDr Alda Amas No changes.  EKG very similar to 1 in September from last year.  Patient asymptomatic.  No intervention recommended.   Type 1 diabetes - Continue home regimen     Abnormal LFTs - Improved -Follow-up gastroenterology as outpatient     History of alcohol abuse -denies active drinking              Consultants:  Procedures performed:   Disposition: Home Diet recommendation:  Discharge Diet Orders (From admission, onward)     Start     Ordered   03/29/24 0000  Diet - low sodium heart healthy        03/29/24 1638           Carb modified diet DISCHARGE MEDICATION: Allergies as of 03/29/2024       Reactions   Other Anaphylaxis, Other (See Comments)   [name] - anaphylaxis        Medication List     STOP taking these medications    Butrans  7.5 MCG/HR Generic drug: buprenorphine        TAKE these medications    carvedilol  12.5 MG tablet Commonly known as: COREG  Take 1 tablet (12.5 mg total) by mouth 2 (two) times daily with a meal.   Dexcom G7 Sensor Misc 1 Device by Does not apply route as directed.   gabapentin  100 MG capsule Commonly known as: Neurontin  Take 1 capsule (100 mg total) by mouth 2 (two) times daily.   Insulin  Pen Needle 32G X 4 MM Misc 1 Device by Does not apply route in the morning, at noon, in the evening, and at bedtime.   Lantus  SoloStar 100 UNIT/ML Solostar Pen Generic drug: insulin  glargine Inject 16 Units into the skin daily.   lipase/protease/amylase 40981 UNITS Cpep capsule Commonly known as: Creon  Take 1-2 capsules (36,000-72,000 Units total) by mouth See admin instructions. Take 72,000 units by mouth three times a day before meals and 36,000 units with any snacks   multivitamin Tabs tablet Take 1 tablet by mouth daily with breakfast.   naloxone  4 MG/0.1ML Liqd nasal spray kit Commonly known as: NARCAN  Place  1 spray into the nose once as needed (Overdose).   NovoLOG  FlexPen 100 UNIT/ML FlexPen Generic drug: insulin  aspart Max daily 30 units   omeprazole  40 MG capsule Commonly known as: PRILOSEC TAKE 1 CAPSULE (40 MG TOTAL) BY MOUTH DAILY.   oxyCODONE  5 MG immediate release tablet Commonly known as: Oxy IR/ROXICODONE  Take 1 tablet (5 mg total) by mouth every 6 (six) hours as needed for up to 5 days for moderate pain (pain score 4-6).         Follow-up Information     [name], Dinah C, NP Follow up in 1 week(s).   Specialty: Family Medicine Contact information: [address] [place] [address] [phone]                Discharge Exam: Cleavon Curls Weights   03/27/24 1731 03/27/24 2032  Weight: 72.6 kg 61.5 kg   General-appears in no acute distress Heart-S1-S2, regular, no murmur auscultated Lungs-clear to auscultation bilaterally, no wheezing or crackles auscultated Abdomen-soft, nontender, no organomegaly Extremities-no edema in the lower extremities Neuro-alert, oriented x3, no focal deficit noted  Condition at discharge: good  The results of significant diagnostics from this hospitalization (including imaging, microbiology, ancillary and laboratory) are listed below for reference.   Imaging Studies: CT ABDOMEN PELVIS W CONTRAST Result Date: 03/28/2024 CLINICAL DATA:  Acute abdominal pain EXAM: CT ABDOMEN AND PELVIS WITH CONTRAST TECHNIQUE: Multidetector CT imaging of the abdomen and pelvis was performed using the standard protocol following bolus administration of intravenous contrast. RADIATION DOSE REDUCTION: This exam was performed according to the departmental dose-optimization program which includes automated exposure control, adjustment of the mA and/or kV according to patient size and/or use of iterative reconstruction technique. CONTRAST:  OMNIPAQUE  IOHEXOL  300 MG/ML  SOLN COMPARISON:  08/25/23 FINDINGS: Lower chest: No acute abnormality. Hepatobiliary: Gallbladder is well distended. Minimal dependent density is noted likely representing gallbladder sludge. Small stones could not be totally excluded. Liver demonstrates some geographic differential enhancement likely related to the known portal vein thrombosis and cavernous transformation. No biliary ductal dilatation is seen. Pancreas: Pancreas again demonstrates calcifications consistent with prior chronic pancreatitis. No significant inflammatory change  to suggest acute pancreatitis is noted. Small retrogastric pancreatic pseudocyst is seen. Spleen: Normal in size without focal abnormality. Adrenals/Urinary Tract: Adrenal glands are within normal limits. Kidneys demonstrate a normal enhancement pattern bilaterally. Bladder is well distended. No calculi are seen. Stomach/Bowel: No obstructive or inflammatory changes of the colon are seen. The appendix is within normal limits. Small bowel is within normal limits. Stomach demonstrates a sliding-type hiatal hernia. A small retrogastric pancreatic pseudocyst is seen measuring 1.9 cm. Vascular/Lymphatic: Aortic atherosclerosis. No enlarged abdominal or pelvic lymph nodes. Reproductive: Prostate is unremarkable. Other: No abdominal wall hernia or abnormality. No abdominopelvic ascites. Musculoskeletal: No acute or significant osseous findings. IMPRESSION: Changes of chronic pancreatitis with a new small 1.9 cm retrogastric pseudocyst new from the prior study. 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 Small sliding-type hiatal hernia. No other acute abnormality is noted. Electronically Signed   By: Violeta Grey M.D.   On: 03/28/2024 01:18   DG Chest Portable 1 View Result Date: 03/27/2024 CLINICAL DATA:  DKA, vomiting and decreased body temperature. EXAM: PORTABLE CHEST 1 VIEW COMPARISON:  August 29, 2023 FINDINGS: The heart size and mediastinal contours are within normal limits. Both lungs are clear. The visualized skeletal structures are unremarkable. IMPRESSION: No active disease. Electronically Signed   By: Elizabeth Gulling.D.  On: 03/27/2024 18:16   NCV with EMG(electromyography) Result Date: 03/13/2024 Ellene Gustin, MD     03/13/2024 10:19 AM [facility] [address] [place], [address]  [place], [place] [address] Tel: [phone] Fax: [phone] Test Date:  03/13/2024 Patient: Lekendrick Alpern DOB: [birth year] Physician: Rommie Coats, MD Sex: Male Height: 6\' 5"  Ref Phys:  Rommie Coats, MD ID#: [id number]   Technician:  History: This is a 38 year old male with right arm numbness. NCV & EMG Findings: Extensive electrodiagnostic evaluation of the right upper limb with additional nerve conduction studies of the left upper limb shows: Bilateral lateral antebrachial cutaneous sensory responses show reduced amplitude (L12, R13 V), but are symmetric, likely suggesting these are normal responses or technical in nature. Right median, ulnar, radial, and bilateral medial antebrachial cutaneous sensory responses are within normal limits. Right median (APB) and ulnar (ADM) motor responses are within normal limits. There is no evidence of active or chronic motor axon loss changes affecting any of the tested muscles on needle examination. Motor unit configuration and recruitment pattern is within normal limits. Impression: This study shows no significant abnormalities. Specifically, there is no definitive electrodiagnostic evidence of right brachial plexopathy, right cervical (C5-C8) motor radiculopathy, or right median, ulnar, or radial mononeuropathies. ___________________________ Rommie Coats, MD Nerve Conduction Studies Motor Nerve Results   Latency Amplitude F-Lat Segment Distance CV Comment Site (ms) Norm (mV) Norm (ms)  (cm) (m/s) Norm  Right Median (APB) Motor Wrist 3.0  < 3.9 14.1  > 6.0       Elbow 9.3 - 13.6 -  Elbow-Wrist 31.5 50  > 50  Right Ulnar (ADM) Motor Wrist 2.7  < 3.1 12.4  > 7.0       Bel elbow 7.6 - 11.4 -  Bel elbow-Wrist 25 51  > 50  Ab elbow 9.6 - 10.6 -  Ab elbow-Bel elbow 10 50 -  Sensory Sites   Neg Peak Lat Amplitude (O-P) Segment Distance Velocity Comment Site (ms) Norm (V) Norm  (cm) (ms)  Left Lateral Antebrachial Cutaneous Sensory Lat biceps-Lat forearm 2.8  < 2.9 *12  > 14 Lat biceps-Lat forearm 12   Right Lateral Antebrachial Cutaneous Sensory Lat biceps-Lat forearm 2.7  < 2.9 *13  > 14 Lat biceps-Lat forearm 12   Left Medial Antebrachial Cutaneous Sensory  Elbow-Med forearm 3.2  < 3.2 7  > 5 Elbow-Med forearm 14   Right Medial Antebrachial Cutaneous Sensory Elbow-Med forearm 2.6  < 3.2 7  > 5 Elbow-Med forearm 12   Right Median Sensory Wrist-Dig II 3.4  < 3.4 27  > 20 Wrist-Dig II 13   Right Radial Sensory Forearm-Wrist 2.2  < 2.7 19  > 18 Forearm-Wrist 10   Right Ulnar Sensory Wrist-Dig V 2.8  < 3.1 15  > 12 Wrist-Dig V 11   Electromyography  Side Muscle Ins.Act Fibs Fasc Recrt Amp Dur Poly Activation Comment Right FDI Nml Nml Nml Nml Nml Nml Nml Nml N/A Right EIP Nml Nml Nml Nml Nml Nml Nml Nml N/A Right Pronator teres Nml Nml Nml Nml Nml Nml Nml Nml N/A Right FDP Nml Nml Nml Nml Nml Nml Nml Nml N/A Right Biceps Nml Nml Nml Nml Nml Nml Nml Nml N/A Right Triceps Nml Nml Nml Nml Nml Nml Nml Nml N/A Right Deltoid Nml Nml Nml Nml Nml Nml Nml Nml N/A Waveforms: Motor    Sensory               Microbiology: Results  for orders placed or performed during the hospital encounter of 03/27/24  MRSA Next Gen by PCR, Nasal     Status: None   Collection Time: 03/27/24  8:33 PM   Specimen: Nasal Mucosa; Nasal Swab  Result Value Ref Range Status   MRSA by PCR Next Gen NOT DETECTED NOT DETECTED Final    Comment: (NOTE) The GeneXpert MRSA Assay (FDA approved for NASAL specimens only), is one component of a comprehensive MRSA colonization surveillance program. It is not intended to diagnose MRSA infection nor to guide or monitor treatment for MRSA infections. Test performance is not FDA approved in patients less than 59 years old. Performed at [facility], [address] [place], [place] [address]     Labs: CBC: Recent Labs  Lab 03/27/24 1608 03/28/24 0328 03/29/24 0554  WBC 14.3* 9.3 7.2  NEUTROABS 12.8*  --   --   HGB 16.6 12.7* 12.3*  HCT 50.4 39.0 36.7*  MCV 111.3* 111.1* 107.3*  PLT 284 158 144*   Basic Metabolic Panel: Recent Labs  Lab 03/27/24 2235 03/28/24 0328 03/28/24 0638 03/28/24 1157 03/29/24 0554  NA 133* 133*  133* 134* 128*  K 4.9 3.7 3.3* 3.3* 3.7  CL 103 104 104 104 96*  CO2 13* 19* 20* 22 22  GLUCOSE 159* 171* 178* 134* 206*  BUN 10 8 8 6  <5*  CREATININE 1.02 0.79 0.51* 0.59* 0.62  CALCIUM  10.2 10.2 10.3 10.3 9.7   Liver Function Tests: Recent Labs  Lab 03/27/24 1608 03/28/24 0328 03/29/24 0554  AST 65* 33 26  ALT 72* 45* 34  ALKPHOS 179* 112 97  BILITOT 2.2* 1.1 1.1  PROT 10.4* 7.1 6.4*  ALBUMIN  5.4* 3.9 3.2*   CBG: Recent Labs  Lab 03/28/24 1608 03/28/24 1705 03/28/24 2147 03/29/24 0803 03/29/24 1138  GLUCAP 157* 145* 281* 233* 70    Discharge time spent: greater than 30 minutes.  Signed: Ozell Blunt, MD [facility] 03/29/2024

## 2024-03-29 NOTE — Inpatient Diabetes Management (Signed)
 Inpatient Diabetes Program Recommendations  AACE/ADA: New Consensus Statement on Inpatient Glycemic Control (2015)  Target Ranges:  Prepandial:   less than 140 mg/dL      Peak postprandial:   less than 180 mg/dL (1-2 hours)      Critically ill patients:  140 - 180 mg/dL   Lab Results  Component Value Date   GLUCAP 233 (H) 03/29/2024   HGBA1C 7.9 (A) 09/19/2023    Review of Glycemic Control  Latest Reference Range & Units 03/28/24 14:06 03/28/24 15:17 03/28/24 16:08 03/28/24 17:05 03/28/24 21:47 03/29/24 08:03  Glucose-Capillary 70 - 99 mg/dL 413 (H) 244 (H) 010 (H) 145 (H) 281 (H) 233 (H)   Diabetes history: DM1 Outpatient Diabetes medications: Lantus  16 units every day, Novolog  - up to 30 units/day Current orders for Inpatient glycemic control:  Semglee  10 units Daily Novolog  0-9 units tid Novolog  3 units tid meal coverage HgbA1C 10.9% on 09/19/2023  Inpatient Diabetes Program Recommendations:    -   Increase Semglee  to 14 units Q24H -   Add Novolog  hs scale  Continue to follow.  Thanks,  Eloise Hake RN, MSN, BC-ADM Inpatient Diabetes Coordinator Team Pager [phone] (8a-5p)

## 2024-03-30 ENCOUNTER — Telehealth: Payer: Self-pay

## 2024-03-30 ENCOUNTER — Other Ambulatory Visit (HOSPITAL_BASED_OUTPATIENT_CLINIC_OR_DEPARTMENT_OTHER): Payer: Self-pay

## 2024-03-30 NOTE — Transitions of Care (Post Inpatient/ED Visit) (Signed)
   03/30/2024  Name: Sho Salguero MRN: [id number] DOB: [birth year]  Today's TOC FU Call Status: Today's TOC FU Call Status:: Unsuccessful Call (1st Attempt) Unsuccessful Call (1st Attempt) Date: 03/30/24  Attempted to reach the patient regarding the most recent Inpatient/ED visit.  Follow Up Plan: Additional outreach attempts will be made to reach the patient to complete the Transitions of Care (Post Inpatient/ED visit) call.   Tonia Frankel RN, CCM [place]  VBCI-Population Health RN Care Manager [phone]

## 2024-04-03 ENCOUNTER — Telehealth: Payer: Self-pay

## 2024-04-03 NOTE — Transitions of Care (Post Inpatient/ED Visit) (Signed)
 04/03/2024  Name: Carlos [name] MRN: [id number] DOB: [birth year]  Today's TOC FU Call Status: Today's TOC FU Call Status:: Successful TOC FU Call Completed TOC FU Call Complete Date: 04/03/24 Patient's Name and Date of Birth confirmed.  Transition Care Management Follow-up Telephone Call How have you been since you were released from the hospital?: Better Any questions or concerns?: No  Items Reviewed: Did you receive and understand the discharge instructions provided?: Yes Medications obtained,verified, and reconciled?: Yes (Medications Reviewed) Any new allergies since your discharge?: No Dietary orders reviewed?: Yes Type of Diet Ordered:: diabetic Do you have support at home?: Yes Name of Support/Comfort Primary Source: girlfiend can help as needed and lives in the home  Medications Reviewed Today: Medications Reviewed Today     Reviewed by Sharmaine Dearth, RN (Registered Nurse) on 04/03/24 at 1646  Med List Status: <None>   Medication Order Taking? Sig Documenting Provider Last Dose Status Informant  carvedilol (COREG) 12.5 MG tablet [id number] Yes Take 1 tablet (12.5 mg total) by mouth 2 (two) times daily with a meal. Ozell Blunt, MD Taking Active   Continuous Glucose Sensor (DEXCOM G7 SENSOR) MISC [id number] No 1 Device by Does not apply route as directed.  Patient not taking: Reported on 02/16/2024   [name], Ibtehal Jaralla, MD Not Taking Active Self, Pharmacy Records  gabapentin  (NEURONTIN ) 100 MG capsule [id number] Yes Take 1 capsule (100 mg total) by mouth 2 (two) times daily. Ellene Gustin, MD Taking Active Self, Pharmacy Records  insulin  aspart (NOVOLOG  FLEXPEN) 100 UNIT/ML FlexPen [id number] Yes Max daily 30 units [name], Ibtehal Jaralla, MD Taking Active Self, Pharmacy Records           Med Note (GALARZA RODRIGUEZ, [place] A   Tue Mar 27, 2024  6:47 PM)    insulin  glargine (LANTUS  SOLOSTAR) 100 UNIT/ML Solostar Pen [id number] Yes Inject 16 Units into the skin daily.  [name], Julian Obey, MD Taking Active Self, Pharmacy Records           Med Note [facility] [place], [place] A   Tue Mar 27, 2024  6:47 PM)    Insulin  Pen Needle 32G X 4 MM MISC [id number] Yes 1 Device by Does not apply route in the morning, at noon, in the evening, and at bedtime. [name], Julian Obey, MD Taking Active Self, Pharmacy Records  lipase/protease/amylase (CREON ) 36000 UNITS CPEP capsule [id number] Yes Take 1-2 capsules (36,000-72,000 Units total) by mouth See admin instructions. Take 72,000 units by mouth three times a day before meals and 36,000 units with any snacks [name], Albino Alu., MD Taking Active Self, Pharmacy Records  multivitamin (ONE-A-DAY MEN'S) TABS tablet [id number] Yes Take 1 tablet by mouth daily with breakfast. [name], Dinah C, NP Taking Active Self, Pharmacy Records  naloxone  (NARCAN ) nasal spray 4 mg/0.1 mL [id number] No Place 1 spray into the nose once as needed (Overdose).  Patient not taking: Reported on 04/03/2024   [provider] Not Taking Active Self, Pharmacy Records  omeprazole  (PRILOSEC) 40 MG capsule [id number] Yes TAKE 1 CAPSULE (40 MG TOTAL) BY MOUTH DAILY. [name], Elijio [place], NP Taking Active Self, Pharmacy Records  oxyCODONE  (OXY IR/ROXICODONE ) 5 MG immediate release tablet [id number] No Take 1 tablet (5 mg total) by mouth every 6 (six) hours as needed for up to 5 days for moderate pain (pain score 4-6).  Patient not taking: Reported on 04/03/2024   Ozell Blunt, MD Not Taking Active  Home Care and Equipment/Supplies: Were Home Health Services Ordered?: No Any new equipment or medical supplies ordered?: No  Functional Questionnaire: Do you need assistance with bathing/showering or dressing?: No Do you need assistance with meal preparation?: No Do you need assistance with eating?: No Do you have difficulty maintaining continence: No Do you need assistance with getting out of bed/getting out of a chair/moving?:  No Do you have difficulty managing or taking your medications?: Yes  Follow up appointments reviewed: PCP Follow-up appointment confirmed?: Yes (TOC RN assisted patient with scheduled appointment) Date of PCP follow-up appointment?: 04/04/24 Follow-up Provider: [name], [facility] Follow-up appointment confirmed?: Yes [facility] RN assisted patient in getting appointment scheduled) Date of Specialist follow-up appointment?: 04/26/24 Follow-Up Specialty Provider:: GI, Dr Coye Diver [name] Do you need transportation to your follow-up appointment?: No Do you understand care options if your condition(s) worsen?: Yes-patient verbalized understanding  SDOH Interventions Today    Flowsheet Row Most Recent Value  SDOH Interventions   Food Insecurity Interventions Intervention Not Indicated  Housing Interventions Intervention Not Indicated  Transportation Interventions Intervention Not Indicated  Utilities Interventions Intervention Not Indicated       Goals Addressed             This Visit's Progress    VBCI Transitions of Care (TOC) Care Plan       Problems:  Recent Hospitalization for treatment of Diabetic ketoacidosis without coma associated with type 1 diabetes mellitus No Hospital Follow Up Provider appointment Patient did not have hospital follow up appointments scheduled with PCP or GI - Conference calls were made with patient to both and appointments were scheduled  Goal:  Over the next 30 days, the patient will not experience hospital readmission  Interventions:  Transitions of Care: Doctor Visits  - discussed the importance of doctor visits Arranged PCP follow-up within 7 days (Care Guide Scheduled)  Diabetes Interventions: Assessed patient's understanding of A1c goal: <6.5% Reviewed medications with patient and discussed importance of medication adherence Discussed plans with patient for ongoing care management follow up and provided patient with direct  contact information for care management team Reviewed scheduled/upcoming provider appointments including: PCP appointment scheduled by [facility] RN for 04/04/24 and GI, Dr Brice Campi 04/26/24 Lab Results  Component Value Date   HGBA1C 7.9 (A) 09/19/2023    Patient Self Care Activities:  Attend all scheduled provider appointments Call pharmacy for medication refills 3-7 days in advance of running out of medications Call provider office for new concerns or questions  Notify RN Care Manager of [facility] call rescheduling needs Participate in Transition of Care Program/Attend TOC scheduled calls Take medications as prescribed   take the blood sugar log to all doctor visits drink 6 to 8 glasses of water each day  Plan:  Telephone follow up appointment with care management team member scheduled for:  04/10/24 11am The patient has been provided with contact information for the care management team and has been advised to call with any health related questions or concerns.         Tonia Frankel RN, CCM [place]  VBCI-Population Health RN Care Manager [phone]

## 2024-04-03 NOTE — Patient Instructions (Signed)
 Visit Information  Thank you for taking time to visit with me today. Please don't hesitate to contact me if I can be of assistance to you before our next scheduled telephone appointment.  Our next appointment is by telephone on 04/10/24 at 11am  Following is a copy of your care plan:   Goals Addressed             This Visit's Progress    VBCI Transitions of Care (TOC) Care Plan       Problems:  Recent Hospitalization for treatment of Diabetic ketoacidosis without coma associated with type 1 diabetes mellitus No Hospital Follow Up Provider appointment Patient did not have hospital follow up appointments scheduled with PCP or GI - Conference calls were made with patient to both and appointments were scheduled  Goal:  Over the next 30 days, the patient will not experience hospital readmission  Interventions:  Transitions of Care: Doctor Visits  - discussed the importance of doctor visits Arranged PCP follow-up within 7 days (Care Guide Scheduled)  Diabetes Interventions: Assessed patient's understanding of A1c goal: <6.5% Reviewed medications with patient and discussed importance of medication adherence Discussed plans with patient for ongoing care management follow up and provided patient with direct contact information for care management team Reviewed scheduled/upcoming provider appointments including: PCP appointment scheduled by [facility] RN for 04/04/24 and GI, Dr Brice Campi 04/26/24 Lab Results  Component Value Date   HGBA1C 7.9 (A) 09/19/2023    Patient Self Care Activities:  Attend all scheduled provider appointments Call pharmacy for medication refills 3-7 days in advance of running out of medications Call provider office for new concerns or questions  Notify RN Care Manager of TOC call rescheduling needs Participate in Transition of Care Program/Attend TOC scheduled calls Take medications as prescribed   take the blood sugar log to all doctor visits drink 6 to 8 glasses of  water each day  Plan:  Telephone follow up appointment with care management team member scheduled for:  04/10/24 11am The patient has been provided with contact information for the care management team and has been advised to call with any health related questions or concerns.         Patient verbalizes understanding of instructions and care plan provided today and agrees to view in MyChart. Active MyChart status and patient understanding of how to access instructions and care plan via MyChart confirmed with patient.     Telephone follow up appointment with care management team member scheduled for:04/10/24 11am The patient has been provided with contact information for the care management team and has been advised to call with any health related questions or concerns.   Please call the care guide team at [phone] if you need to cancel or reschedule your appointment.   Please call the Suicide and Crisis Lifeline: 988 call 1-800-273-TALK (toll free, 24 hour hotline) call 911 if you are experiencing a Mental Health or Behavioral Health Crisis or need someone to talk to.  Tonia Frankel RN, CCM [place]  VBCI-Population Health RN Care Manager [phone]

## 2024-04-04 ENCOUNTER — Encounter: Payer: Self-pay | Admitting: Family

## 2024-04-04 ENCOUNTER — Encounter (HOSPITAL_BASED_OUTPATIENT_CLINIC_OR_DEPARTMENT_OTHER): Payer: Self-pay

## 2024-04-04 ENCOUNTER — Ambulatory Visit (INDEPENDENT_AMBULATORY_CARE_PROVIDER_SITE_OTHER): Admitting: Family

## 2024-04-04 ENCOUNTER — Other Ambulatory Visit (HOSPITAL_BASED_OUTPATIENT_CLINIC_OR_DEPARTMENT_OTHER): Payer: Self-pay

## 2024-04-04 VITALS — BP 132/80 | HR 85 | Temp 97.6°F | Resp 20 | Ht 77.0 in | Wt 140.2 lb

## 2024-04-04 DIAGNOSIS — E1065 Type 1 diabetes mellitus with hyperglycemia: Secondary | ICD-10-CM

## 2024-04-04 DIAGNOSIS — R1084 Generalized abdominal pain: Secondary | ICD-10-CM | POA: Diagnosis not present

## 2024-04-04 DIAGNOSIS — R110 Nausea: Secondary | ICD-10-CM

## 2024-04-04 DIAGNOSIS — K8020 Calculus of gallbladder without cholecystitis without obstruction: Secondary | ICD-10-CM

## 2024-04-04 DIAGNOSIS — K869 Disease of pancreas, unspecified: Secondary | ICD-10-CM

## 2024-04-04 DIAGNOSIS — I10 Essential (primary) hypertension: Secondary | ICD-10-CM

## 2024-04-04 DIAGNOSIS — D649 Anemia, unspecified: Secondary | ICD-10-CM

## 2024-04-04 MED ORDER — OXYCODONE HCL 5 MG PO TABS
5.0000 mg | ORAL_TABLET | ORAL | 0 refills | Status: DC | PRN
  Filled 2024-04-04: qty 60, 10d supply, fill #1
  Filled 2024-04-04: qty 30, 5d supply, fill #0

## 2024-04-04 MED ORDER — OXYCODONE HCL 5 MG PO CAPS
5.0000 mg | ORAL_CAPSULE | Freq: Four times a day (QID) | ORAL | 0 refills | Status: DC | PRN
  Filled 2024-04-04: qty 90, 23d supply, fill #0

## 2024-04-04 NOTE — Progress Notes (Signed)
 Provider: Christean Courts FNP-C   Jash Wahlen, Elijio Guadeloupe, NP  Patient Care Team: Nyssa Sayegh, Elijio Guadeloupe, NP as PCP - General (Family Medicine) Lenton Rail, MD as Consulting Physician (Otolaryngology) Sharmaine Dearth, RN as Registered Nurse  Extended Emergency Contact Information Primary Emergency Contact: brathwaite,[email or web address] Address: [address]          [place], [place] [address] [place] Home Phone: [phone] Work Phone: [phone] Mobile Phone: [phone] Relation: Significant other  Code Status:  Full Code  Goals of care: Advanced Directive information    04/04/2024   10:53 AM  Advanced Directives  Does Patient Have a Medical Advance Directive? No  Would patient like information on creating a medical advance directive? No - Patient declined     Chief Complaint  Patient presents with   [facility] follow up.    Discussed the use of AI scribe software for clinical note transcription with the patient, who gave verbal consent to proceed.  History of Present Illness   Kobie Whidby is a 38 year old male with type 1 diabetes and pancreatitis who presents for follow-up after hospitalization for diabetic ketoacidosis and pancreatitis.  He was hospitalized on May 20th for diabetic ketoacidosis and pancreatitis. During the hospitalization, he experienced nausea and vomiting, which have since resolved, although he continues to experience nausea without vomiting. He has lost significant weight, from 152 pounds to 140 pounds, and has difficulty eating due to pain. A cyst behind his pancreas and gallstones were identified, and he is scheduled to follow up with a gastroenterologist on June 19th. He has a history of alcohol use but has been abstinent for five years.  During his hospital stay, he experienced high blood pressure and was started on carvedilol 12.5 mg twice daily. He monitors his blood pressure at home, with recent readings showing improvement,  such as 132/80 mmHg. He also has a history of ST elevation on EKG, similar to findings from September of the previous year, but without symptoms at that time.  He has type 1 diabetes and was started on Lantus  and NovoLog  during his hospital stay. He takes 60 units of NovoLog  in the morning and uses a sliding scale after meals. His blood sugar was 158 mg/dL before eating today. He is awaiting a new Dexcom G6 device for continuous glucose monitoring, as the previous device was incompatible with his phone.  He experiences significant pain, particularly in the right side radiating to the back, which affects his ability to work and care for his two-year-old child. He was prescribed oxycodone  5 mg every six hours as needed for pain, but it provides only temporary relief. He previously saw a pain management specialist but stopped in March and is seeking a new referral or pain management solution.  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   Past Medical History:  Diagnosis Date   AKI (acute kidney injury) (HCC) 12/21/2021   Anxiety    Diabetes (HCC)    Pancreatitis    Splenic vein thrombosis    Past Surgical History:  Procedure Laterality Date   BILIARY BRUSHING  02/02/2023   Procedure: BILIARY BRUSHING;  Surgeon: Normie Becton., MD;  Location: [facility];  Service: Gastroenterology;;   BILIARY BRUSHING  04/28/2023   Procedure: BILIARY BRUSHING;  Surgeon: Asencion Blacksmith, MD;  Location: [facility] ENDOSCOPY;  Service: Gastroenterology;;   BILIARY BRUSHING  06/02/2023  Procedure: BILIARY BRUSHING;  Surgeon: Brice Campi Albino Alu., MD;  Location: [facility];  Service: Gastroenterology;;   BILIARY BRUSHING  01/05/2024   Procedure: BILIARY BRUSHING;  Surgeon: Normie Becton., MD;  Location: [facility];  Service: Gastroenterology;;   BILIARY DILATION  12/01/2022   Procedure: BILIARY DILATION;  Surgeon: Normie Becton.,  MD;  Location: [facility];  Service: Gastroenterology;;   BILIARY DILATION  02/02/2023   Procedure: BILIARY DILATION;  Surgeon: Normie Becton., MD;  Location: [facility];  Service: Gastroenterology;;   BILIARY DILATION  01/05/2024   Procedure: BILIARY DILATION;  Surgeon: Normie Becton., MD;  Location: [facility];  Service: Gastroenterology;;   BILIARY STENT PLACEMENT  04/28/2023   Procedure: BILIARY STENT PLACEMENT;  Surgeon: Asencion Blacksmith, MD;  Location: [facility] ENDOSCOPY;  Service: Gastroenterology;;   BILIARY STENT PLACEMENT N/A 06/02/2023   Procedure: BILIARY STENT PLACEMENT;  Surgeon: Normie Becton., MD;  Location: [facility]OSCOPY;  Service: Gastroenterology;  Laterality: N/A;   BIOPSY  12/02/2021   Procedure: BIOPSY;  Surgeon: Brice Campi Albino Alu., MD;  Location: [facility];  Service: Gastroenterology;;   ENDOSCOPIC RETROGRADE CHOLANGIOPANCREATOGRAPHY (ERCP) WITH PROPOFOL  N/A 08/12/2022   Procedure: ENDOSCOPIC RETROGRADE CHOLANGIOPANCREATOGRAPHY (ERCP) WITH PROPOFOL ;  Surgeon: Normie Becton., MD;  Location: [facility];  Service: Gastroenterology;  Laterality: N/A;   ENDOSCOPIC RETROGRADE CHOLANGIOPANCREATOGRAPHY (ERCP) WITH PROPOFOL  N/A 12/01/2022   Procedure: ENDOSCOPIC RETROGRADE CHOLANGIOPANCREATOGRAPHY (ERCP) WITH PROPOFOL ;  Surgeon: Brice Campi Albino Alu., MD;  Location: [facility];  Service: Gastroenterology;  Laterality: N/A;   ENDOSCOPIC RETROGRADE CHOLANGIOPANCREATOGRAPHY (ERCP) WITH PROPOFOL  N/A 02/02/2023   Procedure: ENDOSCOPIC RETROGRADE CHOLANGIOPANCREATOGRAPHY (ERCP) WITH PROPOFOL ;  Surgeon: Brice Campi Albino Alu., MD;  Location: [facility];  Service: Gastroenterology;  Laterality: N/A;   ENDOSCOPIC RETROGRADE CHOLANGIOPANCREATOGRAPHY (ERCP) WITH PROPOFOL  N/A 06/02/2023   Procedure: ENDOSCOPIC RETROGRADE CHOLANGIOPANCREATOGRAPHY (ERCP) WITH PROPOFOL ;  Surgeon: Brice Campi Albino Alu., MD;  Location: [facility];  Service:  Gastroenterology;  Laterality: N/A;   ENDOSCOPIC RETROGRADE CHOLANGIOPANCREATOGRAPHY (ERCP) WITH PROPOFOL  N/A 01/05/2024   Procedure: ENDOSCOPIC RETROGRADE CHOLANGIOPANCREATOGRAPHY (ERCP) WITH PROPOFOL ;  Surgeon: Brice Campi Albino Alu., MD;  Location: [facility];  Service: Gastroenterology;  Laterality: N/A;   ERCP N/A 04/28/2023   Procedure: ENDOSCOPIC RETROGRADE CHOLANGIOPANCREATOGRAPHY (ERCP);  Surgeon: Asencion Blacksmith, MD;  Location: [facility] END[facility]COPY;  Service: Gastroenterology;  Laterality: N/A;   ESOPHAGOGASTRODUODENOSCOPY N/A 12/01/2022   Procedure: ESOPHAGOGASTRODUODENOSCOPY (EGD);  Surgeon: Normie Becton., MD;  Location: [facility];  Service: Gastroenterology;  Laterality: N/A;   ESOPHAGOGASTRODUODENOSCOPY (EGD) WITH PROPOFOL  N/A 12/02/2021   Procedure: ESOPHAGOGASTRODUODENOSCOPY (EGD) WITH PROPOFOL ;  Surgeon: Brice Campi Albino Alu., MD;  Location: [facility];  Service: Gastroenterology;  Laterality: N/A;   ESOPHAGOGASTRODUODENOSCOPY (EGD) WITH PROPOFOL  N/A 07/09/2023   Procedure: ESOPHAGOGASTRODUODENOSCOPY (EGD) WITH PROPOFOL ;  Surgeon: Tobin Forts, MD;  Location: [facility];  Service: Gastroenterology;  Laterality: N/A;  needs ercp scope for stent pull   FINE NEEDLE ASPIRATION N/A 12/01/2022   Procedure: FINE NEEDLE ASPIRATION (FNA) LINEAR;  Surgeon: Normie Becton., MD;  Location: [facility];  Service: Gastroenterology;  Laterality: N/A;   NEUROLYTIC CELIAC PLEXUS  12/01/2022   Procedure: CELIAC PLEXUS BLOCK;  Surgeon: Brice Campi Albino Alu., MD;  Location: [facility];  Service: Gastroenterology;;   PANCREATIC STENT PLACEMENT  08/12/2022   Procedure: PANCREATIC STENT PLACEMENT;  Surgeon: Normie Becton., MD;  Location: [facility];  Service: Gastroenterology;;   PANCREATIC STENT PLACEMENT  12/01/2022   Procedure: PANCREATIC STENT PLACEMENT;  Surgeon: Normie Becton., MD;  Location: [facility];  Service: Gastroenterology;;  PANCREATIC  STENT PLACEMENT  02/02/2023   Procedure: PANCREATIC STENT PLACEMENT;  Surgeon: Brice Campi Albino Alu., MD;  Location: [facility];  Service: Gastroenterology;;   PANCREATIC STENT PLACEMENT  06/02/2023   Procedure: PANCREATIC STENT PLACEMENT;  Surgeon: Normie Becton., MD;  Location: [facility];  Service: Gastroenterology;;   REMOVAL OF STONES  12/01/2022   Procedure: REMOVAL OF STONES;  Surgeon: Normie Becton., MD;  Location: [facility];  Service: Gastroenterology;;   REMOVAL OF STONES  02/02/2023   Procedure: REMOVAL OF STONES;  Surgeon: Normie Becton., MD;  Location: [facility];  Service: Gastroenterology;;   REMOVAL OF STONES  06/02/2023   Procedure: REMOVAL OF STONES;  Surgeon: Normie Becton., MD;  Location: [facility];  Service: Gastroenterology;;   REMOVAL OF STONES  01/05/2024   Procedure: REMOVAL OF SLUDGE;  Surgeon: Normie Becton., MD;  Location: [facility];  Service: Gastroenterology;;   [address]  08/12/2022   Procedure: [address];  Surgeon: Normie Becton., MD;  Location: [facility];  Service: Gastroenterology;;   [address]  12/01/2022   Procedure: [address];  Surgeon: Normie Becton., MD;  Location: [facility];  Service: Gastroenterology;;   [address]  06/02/2023   Procedure: [address];  Surgeon: Normie Becton., MD;  Location: [facility];  Service: Gastroenterology;;   [place] REMOVAL  12/01/2022   Procedure: STENT REMOVAL;  Surgeon: Normie Becton., MD;  Location: [facility];  Service: Gastroenterology;;   [place] REMOVAL  02/02/2023   Procedure: STENT REMOVAL;  Surgeon: Normie Becton., MD;  Location: [facility];  Service: Gastroenterology;;   [address] REMOVAL  06/02/2023   Procedure: STENT REMOVAL;  Surgeon: Normie Becton., MD;  Location: [facility];  Service: Gastroenterology;;   [address] REMOVAL  07/09/2023   Procedure: STENT REMOVAL;   Surgeon: Tobin Forts, MD;  Location: [facility];  Service: Gastroenterology;;   [place] [address] REMOVAL  01/05/2024   Procedure: STENT REMOVAL;  Surgeon: Normie Becton., MD;  Location: [facility];  Service: Gastroenterology;;   UPPER ESOPHAGEAL ENDOSCOPIC ULTRASOUND (EUS) N/A 12/02/2021   Procedure: UPPER ESOPHAGEAL ENDOSCOPIC ULTRASOUND (EUS);  Surgeon: Normie Becton., MD;  Location: [facility];  Service: Gastroenterology;  Laterality: N/A;   UPPER ESOPHAGEAL ENDOSCOPIC ULTRASOUND (EUS) N/A 12/01/2022   Procedure: UPPER ESOPHAGEAL ENDOSCOPIC ULTRASOUND (EUS);  Surgeon: Normie Becton., MD;  Location: [facility];  Service: Gastroenterology;  Laterality: N/A;    Allergies  Allergen Reactions   Other Anaphylaxis and Other (See Comments)    [name] - anaphylaxis    Allergies as of 04/04/2024       Reactions   Other Anaphylaxis, Other (See Comments)   [name] - anaphylaxis        Medication List        Accurate as of Apr 04, 2024  7:53 PM. If you have any questions, ask your nurse or doctor.          carvedilol 12.5 MG tablet Commonly known as: COREG Take 1 tablet (12.5 mg total) by mouth 2 (two) times daily with a meal.   Dexcom G7 Sensor Misc 1 Device by Does not apply route as directed.   gabapentin  100 MG capsule Commonly known as: Neurontin  Take 1 capsule (100 mg total) by mouth 2 (two) times daily.   Insulin  Pen Needle 32G X 4 MM Misc 1 Device by Does not apply route in the morning, at noon, in the evening, and at bedtime.   Lantus  SoloStar 100 UNIT/ML Solostar Pen Generic drug: insulin  glargine Inject  16 Units into the skin daily.   lipase/protease/amylase 16109 UNITS Cpep capsule Commonly known as: Creon  Take 1-2 capsules (36,000-72,000 Units total) by mouth See admin instructions. Take 72,000 units by mouth three times a day before meals and 36,000 units with any snacks   multivitamin Tabs tablet Take 1 tablet by mouth  daily with breakfast.   naloxone  4 MG/0.1ML Liqd nasal spray kit Commonly known as: NARCAN  Place 1 spray into the nose once as needed (Overdose).   NovoLOG  FlexPen 100 UNIT/ML FlexPen Generic drug: insulin  aspart Max daily 30 units   omeprazole  40 MG capsule Commonly known as: PRILOSEC TAKE 1 CAPSULE (40 MG TOTAL) BY MOUTH DAILY.   oxyCODONE  5 MG immediate release tablet Commonly known as: Oxy IR/ROXICODONE  Take 1 tablet (5 mg total) by mouth every 4 (four) hours as needed for severe pain (pain score 7-10). What changed: Another medication with the same name was removed. Continue taking this medication, and follow the directions you see here. Changed by: Elijio Guadeloupe Adithya Difrancesco        Review of Systems  Constitutional:  Negative for appetite change, chills, fatigue, fever and unexpected weight change.  HENT:  Negative for congestion, ear discharge, ear pain, hearing loss, nosebleeds, postnasal drip, rhinorrhea, sinus pressure, sinus pain, sneezing, sore throat, tinnitus and trouble swallowing.   Eyes:  Negative for pain, discharge, redness, itching and visual disturbance.  Respiratory:  Negative for cough, chest tightness, shortness of breath and wheezing.   Cardiovascular:  Negative for chest pain, palpitations and leg swelling.  Gastrointestinal:  Positive for abdominal pain and nausea. Negative for abdominal distention, blood in stool, constipation, diarrhea and vomiting.  Endocrine: Negative for cold intolerance, heat intolerance, polydipsia, polyphagia and polyuria.  Genitourinary:  Negative for difficulty urinating, dysuria, flank pain, frequency and urgency.  Musculoskeletal:  Negative for arthralgias, back pain, gait problem, joint swelling, myalgias, neck pain and neck stiffness.  Skin:  Negative for color change, pallor, rash and wound.  Neurological:  Negative for dizziness, syncope, speech difficulty, weakness, light-headedness, numbness and headaches.  Hematological:  Does  not bruise/bleed easily.  Psychiatric/Behavioral:  Negative for agitation, behavioral problems, confusion, hallucinations and sleep disturbance. The patient is not nervous/anxious.     Immunization History  Administered Date(s) Administered   Influenza,inj,Quad PF,6+ Mos 09/15/2021   Influenza-Unspecified 08/09/2022   Tdap 10/08/2022   Pertinent  Health Maintenance Due  Topic Date Due   OPHTHALMOLOGY EXAM  Never done   HEMOGLOBIN A1C  03/18/2024   INFLUENZA VACCINE  06/08/2024   FOOT EXAM  09/18/2024      01/31/2023    2:25 PM 07/06/2023    8:59 AM 11/07/2023    9:55 AM 02/16/2024    8:03 AM 04/04/2024   10:52 AM  Fall Risk  Falls in the past year? 0 0 0 0 0  Was there an injury with Fall? 0 0 0 0 0  Fall Risk Category Calculator 0 0 0 0 0  Patient at Risk for Falls Due to No Fall Risks No Fall Risks   No Fall Risks  Fall risk Follow up Falls evaluation completed Falls evaluation completed;Education provided;Falls prevention discussed  Falls evaluation completed Falls evaluation completed   Functional Status Survey:    Vitals:   04/04/24 1056  BP: 132/80  Pulse: 85  Resp: 20  Temp: 97.6 F (36.4 C)  SpO2: 99%  Weight: 140 lb 3.2 oz (63.6 kg)  Height: 6\' 5"  (1.956 m)   Body mass index is 16.63  kg/m. Physical Exam VITALS: T- 97.6, P- 85, BP- 132/80, SaO2- 99% MEASUREMENTS: Weight- 140. GENERAL: Alert, cooperative, well developed, no acute distress. HEENT: Normocephalic, normal oropharynx, moist mucous membranes. CHEST: Clear to auscultation bilaterally, no wheezes, rhonchi, or crackles. CARDIOVASCULAR: Normal heart rate and rhythm, S1 and S2 normal without murmurs. ABDOMEN: Soft, right flank tenderness on palpation, right side tenderness on deep palpation, non-distended, without organomegaly, normal bowel sounds. EXTREMITIES: No cyanosis or edema. NEUROLOGICAL: Cranial nerves grossly intact, moves all extremities without gross motor or sensory deficit.  SKIN: No  rash,no lesion or erythema   PSYCHIATRY/BEHAVIORAL: Mood stable    Labs reviewed: Recent Labs    04/25/23 0459 04/25/23 0500 04/26/23 0446 04/27/23 0254 04/30/23 0534 06/02/23 1032 08/28/23 0340 08/29/23 0428 11/07/23 1034 03/28/24 0638 03/28/24 1157 03/29/24 0554  NA  --    < > 132*   < > 134*   < > 137 133*   < > 133* 134* 128*  K  --    < > 3.4*   < > 3.7   < > 4.0 3.7   < > 3.3* 3.3* 3.7  CL  --    < > 99   < > 97*   < > 103 98   < > 104 104 96*  CO2  --    < > 21*   < > 27   < > 26 24   < > 20* 22 22  GLUCOSE  --    < > 186*   < > 206*   < > 213* 184*   < > 178* 134* 206*  BUN  --    < > <5*   < > <5*   < > 8 7   < > 8 6 <5*  CREATININE  --    < > 0.86   < > 0.94   < > 0.67 0.66   < > 0.51* 0.59* 0.62  CALCIUM   --    < > 8.6*   < > 9.1   < > 8.8* 9.4   < > 10.3 10.3 9.7  MG 1.7  --  1.7   < > 1.8  --  1.9 1.9  --   --   --   --   PHOS 3.3  --  3.4  --   --   --   --   --   --   --   --   --    < > = values in this interval not displayed.   Recent Labs    03/27/24 1608 03/28/24 0328 03/29/24 0554  AST 65* 33 26  ALT 72* 45* 34  ALKPHOS 179* 112 97  BILITOT 2.2* 1.1 1.1  PROT 10.4* 7.1 6.4*  ALBUMIN  5.4* 3.9 3.2*   Recent Labs    08/25/23 2346 08/27/23 0639 11/07/23 1034 03/27/24 1608 03/28/24 0328 03/29/24 0554  WBC 5.3   < > 3.0* 14.3* 9.3 7.2  NEUTROABS 3.5  --  1,926 12.8*  --   --   HGB 15.0   < > 14.0 16.6 12.7* 12.3*  HCT 45.0   < > 41.8 50.4 39.0 36.7*  MCV 108.4*   < > 104.8* 111.3* 111.1* 107.3*  PLT 205   < > 169 284 158 144*   < > = values in this interval not displayed.   Lab Results  Component Value Date   TSH 0.50 11/07/2023   Lab Results  Component Value Date  HGBA1C 7.9 (A) 09/19/2023   Lab Results  Component Value Date   CHOL 104 03/10/2022   HDL 56.50 03/10/2022   LDLCALC 18 03/10/2022   TRIG 153 (H) 07/20/2022   CHOLHDL 2 03/10/2022    Significant Diagnostic Results in last 30 days:  CT ABDOMEN PELVIS W  CONTRAST Result Date: 03/28/2024 CLINICAL DATA:  Acute abdominal pain EXAM: CT ABDOMEN AND PELVIS WITH CONTRAST TECHNIQUE: Multidetector CT imaging of the abdomen and pelvis was performed using the standard protocol following bolus administration of intravenous contrast. RADIATION DOSE REDUCTION: This exam was performed according to the departmental dose-optimization program which includes automated exposure control, adjustment of the mA and/or kV according to patient size and/or use of iterative reconstruction technique. CONTRAST:  OMNIPAQUE  IOHEXOL  300 MG/ML  SOLN COMPARISON:  08/25/23 FINDINGS: Lower chest: No acute abnormality. Hepatobiliary: Gallbladder is well distended. Minimal dependent density is noted likely representing gallbladder sludge. Small stones could not be totally excluded. Liver demonstrates some geographic differential enhancement likely related to the known portal vein thrombosis and cavernous transformation. No biliary ductal dilatation is seen. Pancreas: Pancreas again demonstrates calcifications consistent with prior chronic pancreatitis. No significant inflammatory change to suggest acute pancreatitis is noted. Small retrogastric pancreatic pseudocyst is seen. Spleen: Normal in size without focal abnormality. Adrenals/Urinary Tract: Adrenal glands are within normal limits. Kidneys demonstrate a normal enhancement pattern bilaterally. Bladder is well distended. No calculi are seen. Stomach/Bowel: No obstructive or inflammatory changes of the colon are seen. The appendix is within normal limits. Small bowel is within normal limits. Stomach demonstrates a sliding-type hiatal hernia. A small retrogastric pancreatic pseudocyst is seen measuring 1.9 cm. Vascular/Lymphatic: Aortic atherosclerosis. No enlarged abdominal or pelvic lymph nodes. Reproductive: Prostate is unremarkable. Other: No abdominal wall hernia or abnormality. No abdominopelvic ascites. Musculoskeletal: No acute or  significant osseous findings. IMPRESSION: Changes of chronic pancreatitis with a new small 1.9 cm retrogastric pseudocyst new from the prior study. 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 Small sliding-type hiatal hernia. No other acute abnormality is noted. Electronically Signed   By: Violeta Grey M.D.   On: 03/28/2024 01:18   DG Chest Portable 1 View Result Date: 03/27/2024 CLINICAL DATA:  DKA, vomiting and decreased body temperature. EXAM: PORTABLE CHEST 1 VIEW COMPARISON:  August 29, 2023 FINDINGS: The heart size and mediastinal contours are within normal limits. Both lungs are clear. The visualized skeletal structures are unremarkable. IMPRESSION: No active disease. Electronically Signed   By: Virgle Grime M.D.   On: 03/27/2024 18:16   NCV with EMG(electromyography) Result Date: 03/13/2024 Ellene Gustin, MD     03/13/2024 10:19 AM [facility] [address] [place], [address]  [place], [place] [address] Tel: [phone] Fax: [phone] Test Date:  03/13/2024 Patient: Marquies Wanat DOB: 16-Oct-1986 Physician: Rommie Coats, MD Sex: Male Height: 6\' 5"  Ref Phys: Rommie Coats, MD ID#: [id number]   Technician:  History: This is a 38 year old male with right arm numbness. NCV & EMG Findings: Extensive electrodiagnostic evaluation of the right upper limb with additional nerve conduction studies of the left upper limb shows: Bilateral lateral antebrachial cutaneous sensory responses show reduced amplitude (L12, R13 V), but are symmetric, likely suggesting these are normal responses or technical in nature. Right median, ulnar, radial, and bilateral medial antebrachial cutaneous sensory responses are within normal limits. Right median (APB) and ulnar (ADM) motor responses are within normal limits. There is no evidence of active or chronic motor axon loss  changes affecting any of the tested muscles on needle examination. Motor unit configuration and recruitment pattern is  within normal limits. Impression: This study shows no significant abnormalities. Specifically, there is no definitive electrodiagnostic evidence of right brachial plexopathy, right cervical (C5-C8) motor radiculopathy, or right median, ulnar, or radial mononeuropathies. ___________________________ Rommie Coats, MD Nerve Conduction Studies Motor Nerve Results   Latency Amplitude F-Lat Segment Distance CV Comment Site (ms) Norm (mV) Norm (ms)  (cm) (m/s) Norm  Right Median (APB) Motor Wrist 3.0  < 3.9 14.1  > 6.0       Elbow 9.3 - 13.6 -  Elbow-Wrist 31.5 50  > 50  Right Ulnar (ADM) Motor Wrist 2.7  < 3.1 12.4  > 7.0       Bel elbow 7.6 - 11.4 -  Bel elbow-Wrist 25 51  > 50  Ab elbow 9.6 - 10.6 -  Ab elbow-Bel elbow 10 50 -  Sensory Sites   Neg Peak Lat Amplitude (O-P) Segment Distance Velocity Comment Site (ms) Norm (V) Norm  (cm) (ms)  Left Lateral Antebrachial Cutaneous Sensory Lat biceps-Lat forearm 2.8  < 2.9 *12  > 14 Lat biceps-Lat forearm 12   Right Lateral Antebrachial Cutaneous Sensory Lat biceps-Lat forearm 2.7  < 2.9 *13  > 14 Lat biceps-Lat forearm 12   Left Medial Antebrachial Cutaneous Sensory Elbow-Med forearm 3.2  < 3.2 7  > 5 Elbow-Med forearm 14   Right Medial Antebrachial Cutaneous Sensory Elbow-Med forearm 2.6  < 3.2 7  > 5 Elbow-Med forearm 12   Right Median Sensory Wrist-Dig II 3.4  < 3.4 27  > 20 Wrist-Dig II 13   Right Radial Sensory Forearm-Wrist 2.2  < 2.7 19  > 18 Forearm-Wrist 10   Right Ulnar Sensory Wrist-Dig V 2.8  < 3.1 15  > 12 Wrist-Dig V 11   Electromyography  Side Muscle Ins.Act Fibs Fasc Recrt Amp Dur Poly Activation Comment Right FDI Nml Nml Nml Nml Nml Nml Nml Nml N/A Right EIP Nml Nml Nml Nml Nml Nml Nml Nml N/A Right Pronator teres Nml Nml Nml Nml Nml Nml Nml Nml N/A Right FDP Nml Nml Nml Nml Nml Nml Nml Nml N/A Right Biceps Nml Nml Nml Nml Nml Nml Nml Nml N/A Right Triceps Nml Nml Nml Nml Nml Nml Nml Nml N/A Right Deltoid Nml Nml Nml Nml Nml Nml Nml Nml N/A Waveforms: Motor     Sensory               Assessment/Plan    Type 1 diabetes mellitus Managed with Lantus  and NovoLog . Blood glucose was 158 mg/dL preprandial. Discontinued IV insulin  post-hospitalization. Awaiting new Dexcom G6 for glucose monitoring. - Continue Lantus  and NovoLog  as prescribed - Schedule appointment with endocrinologist - Await Dexcom G6 for glucose monitoring  Diabetic ketoacidosis Recent hospitalization for diabetic ketoacidosis, currently resolved with no ongoing symptoms.  Pancreatitis Recent episode with associated nausea and vomiting. Symptoms improved, but still experiencingnausea.without vomiting. Abnormal liver functions improved during hospitalization. - Follow up with gastroenterologist on June 19  Gallstones Presence of gallstones with right-sided abdominal pain radiating to the back. Potential surgical intervention discussed. Pain management is a concern due to inadequate relief from current medication. - Follow up with gastroenterologist on June 19 - Refer to pain management specialist - Provide additional oxycodone  until pain management appointment  Cyst on pancreas Cyst identified during recent hospitalization. Awaiting further evaluation by gastroenterologist. Potential surgical intervention discussed. - Follow up with gastroenterologist on June 19  Hypertension Well-controlled with carvedilol 12.5 mg twice daily. Home blood pressure readings are within normal range. - Continue carvedilol 12.5 mg twice daily  Anemia Hemoglobin at 12.3 g/dL, previously 6.6 g/dL, likely improved with fluids. Ongoing monitoring required. 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 - Continue gabapentin  100 mg twice dail  Family/ staff Communication: Reviewed plan of care with patient verbalized understanding   Labs/tests ordered:  - CBC/diff - CMP with  GFR  Next Appointment : Return if symptoms worsen or fail to improve.   Spent 30 minutes of Face to face and non-face to face with patient  >50% time spent counseling; reviewing medical record; tests; labs; documentation and developing future plan of care.   Estil Heman, NP

## 2024-04-05 ENCOUNTER — Other Ambulatory Visit (HOSPITAL_BASED_OUTPATIENT_CLINIC_OR_DEPARTMENT_OTHER): Payer: Self-pay

## 2024-04-05 ENCOUNTER — Ambulatory Visit: Payer: Self-pay | Admitting: Family

## 2024-04-05 LAB — TEST AUTHORIZATION

## 2024-04-05 LAB — CBC WITH DIFFERENTIAL/PLATELET
Absolute Lymphocytes: 901 {cells}/uL (ref 850–3900)
Absolute Monocytes: 713 {cells}/uL (ref 200–950)
Basophils Absolute: 30 {cells}/uL (ref 0–200)
Basophils Relative: 0.9 %
Eosinophils Absolute: 99 {cells}/uL (ref 15–500)
Eosinophils Relative: 3 %
HCT: 35.7 % — ABNORMAL LOW (ref 38.5–50.0)
Hemoglobin: 12.1 g/dL — ABNORMAL LOW (ref 13.2–17.1)
MCH: 36.4 pg — ABNORMAL HIGH (ref 27.0–33.0)
MCHC: 33.9 g/dL (ref 32.0–36.0)
MCV: 107.5 fL — ABNORMAL HIGH (ref 80.0–100.0)
MPV: 10.2 fL (ref 7.5–12.5)
Monocytes Relative: 21.6 %
Neutro Abs: 1558 {cells}/uL (ref 1500–7800)
Neutrophils Relative %: 47.2 %
Platelets: 282 10*3/uL (ref 140–400)
RBC: 3.32 10*6/uL — ABNORMAL LOW (ref 4.20–5.80)
RDW: 12.4 % (ref 11.0–15.0)
Total Lymphocyte: 27.3 %
WBC: 3.3 10*3/uL — ABNORMAL LOW (ref 3.8–10.8)

## 2024-04-05 LAB — COMPLETE METABOLIC PANEL WITHOUT GFR
AG Ratio: 1.7 (calc) (ref 1.0–2.5)
ALT: 38 U/L (ref 9–46)
AST: 56 U/L — ABNORMAL HIGH (ref 10–40)
Albumin: 4.1 g/dL (ref 3.6–5.1)
Alkaline phosphatase (APISO): 141 U/L — ABNORMAL HIGH (ref 36–130)
BUN/Creatinine Ratio: 6 (calc) (ref 6–22)
BUN: 4 mg/dL — ABNORMAL LOW (ref 7–25)
CO2: 28 mmol/L (ref 20–32)
Calcium: 9.3 mg/dL (ref 8.6–10.3)
Chloride: 96 mmol/L — ABNORMAL LOW (ref 98–110)
Creat: 0.67 mg/dL (ref 0.60–1.26)
Globulin: 2.4 g/dL (ref 1.9–3.7)
Glucose, Bld: 324 mg/dL — ABNORMAL HIGH (ref 65–99)
Potassium: 3.7 mmol/L (ref 3.5–5.3)
Sodium: 139 mmol/L (ref 135–146)
Total Bilirubin: 0.5 mg/dL (ref 0.2–1.2)
Total Protein: 6.5 g/dL (ref 6.1–8.1)

## 2024-04-05 LAB — IRON,TIBC AND FERRITIN PANEL
%SAT: 66 % — ABNORMAL HIGH (ref 20–48)
Ferritin: 314 ng/mL (ref 38–380)
Iron: 141 ug/dL (ref 50–180)
TIBC: 213 ug/dL — ABNORMAL LOW (ref 250–425)

## 2024-04-05 MED ORDER — DEXCOM G7 SENSOR MISC
1.0000 | 12 refills | Status: AC
  Filled 2024-04-05: qty 3, 30d supply, fill #0
  Filled 2024-05-07: qty 3, 30d supply, fill #1
  Filled 2024-05-31: qty 3, 30d supply, fill #2
  Filled 2024-07-02 – 2024-07-18 (×2): qty 3, 30d supply, fill #3
  Filled 2024-08-10: qty 3, 30d supply, fill #4
  Filled 2024-09-17: qty 3, 30d supply, fill #5
  Filled 2024-11-05 – 2024-12-04 (×2): qty 3, 30d supply, fill #6

## 2024-04-05 MED ORDER — DEXCOM G7 RECEIVER DEVI
1.0000 | 0 refills | Status: DC
  Filled 2024-04-05: qty 1, 30d supply, fill #0

## 2024-04-05 MED ORDER — POTASSIUM CITRATE ER 10 MEQ (1080 MG) PO TBCR
10.0000 meq | EXTENDED_RELEASE_TABLET | Freq: Two times a day (BID) | ORAL | 0 refills | Status: DC
  Filled 2024-04-05: qty 60, 30d supply, fill #0

## 2024-04-06 ENCOUNTER — Other Ambulatory Visit (HOSPITAL_BASED_OUTPATIENT_CLINIC_OR_DEPARTMENT_OTHER): Payer: Self-pay

## 2024-04-09 ENCOUNTER — Other Ambulatory Visit (HOSPITAL_BASED_OUTPATIENT_CLINIC_OR_DEPARTMENT_OTHER): Payer: Self-pay

## 2024-04-10 ENCOUNTER — Telehealth: Payer: Self-pay

## 2024-04-11 ENCOUNTER — Other Ambulatory Visit (HOSPITAL_BASED_OUTPATIENT_CLINIC_OR_DEPARTMENT_OTHER): Payer: Self-pay

## 2024-04-11 ENCOUNTER — Other Ambulatory Visit: Payer: Self-pay

## 2024-04-11 ENCOUNTER — Telehealth: Payer: Self-pay

## 2024-04-12 ENCOUNTER — Other Ambulatory Visit: Payer: Self-pay | Admitting: Family

## 2024-04-12 DIAGNOSIS — R1084 Generalized abdominal pain: Secondary | ICD-10-CM

## 2024-04-12 NOTE — Telephone Encounter (Signed)
 Patient is requesting a refill of the following medications: Requested Prescriptions   Pending Prescriptions Disp Refills   oxyCODONE  (OXY IR/ROXICODONE ) 5 MG immediate release tablet 90 tablet 0    Sig: Take 1 tablet (5 mg total) by mouth every 4 (four) hours as needed for severe pain (pain score 7-10).    Date of last refill:04/04/2024  Refill amount: 90  Treatment agreement date: 03/2024

## 2024-04-12 NOTE — Telephone Encounter (Signed)
Controlled Substance:

## 2024-04-12 NOTE — Telephone Encounter (Signed)
 Too early to refill.

## 2024-04-12 NOTE — Telephone Encounter (Signed)
 Too soon to refill medication.please consider follow up with pain management if requiring sooner refills.

## 2024-04-13 ENCOUNTER — Other Ambulatory Visit (HOSPITAL_BASED_OUTPATIENT_CLINIC_OR_DEPARTMENT_OTHER): Payer: Self-pay

## 2024-04-13 ENCOUNTER — Telehealth: Payer: Self-pay

## 2024-04-13 ENCOUNTER — Other Ambulatory Visit: Payer: Self-pay | Admitting: Family

## 2024-04-13 DIAGNOSIS — R1084 Generalized abdominal pain: Secondary | ICD-10-CM

## 2024-04-13 NOTE — Telephone Encounter (Signed)
 See telephone encounter dated 04/13/24

## 2024-04-13 NOTE — Telephone Encounter (Signed)
 Copied from CRM [id number]. Topic: General - Other >> Apr 13, 2024 10:40 AM Tisa Forester wrote: Reason for CRM: patient returning call he received this morning , when call CAL was transfer to clinical and got a voice mail , please contact patient back   Patient call back number  [phone] >> Apr 13, 2024 11:08 AM Yisroel Hemp wrote: Patient is possibly returning Ashanti's call regarding medication refill.

## 2024-04-16 ENCOUNTER — Other Ambulatory Visit: Payer: Self-pay | Admitting: Family

## 2024-04-16 ENCOUNTER — Other Ambulatory Visit (HOSPITAL_BASED_OUTPATIENT_CLINIC_OR_DEPARTMENT_OTHER): Payer: Self-pay

## 2024-04-16 DIAGNOSIS — R1084 Generalized abdominal pain: Secondary | ICD-10-CM

## 2024-04-16 NOTE — Telephone Encounter (Signed)
 Signed 1 week ago (04/04/2024):  oxyCODONE  (OXY IR/ROXICODONE ) 5 MG immediate release tablet  Sig: Take 1 tablet (5 mg total) by mouth every 4 (four) hours as needed for severe pain (pain score 7-10).  Disp: 90 tablet    Refills: 0  Signed by: [name], Dinah C, NP

## 2024-04-18 ENCOUNTER — Other Ambulatory Visit: Payer: Self-pay | Admitting: Family

## 2024-04-18 DIAGNOSIS — R1084 Generalized abdominal pain: Secondary | ICD-10-CM

## 2024-04-18 NOTE — Telephone Encounter (Signed)
 Medication to be refilled 05/05/2024 last filled 04/04/2024 .

## 2024-04-19 ENCOUNTER — Other Ambulatory Visit: Payer: Self-pay | Admitting: Family

## 2024-04-19 ENCOUNTER — Other Ambulatory Visit (HOSPITAL_BASED_OUTPATIENT_CLINIC_OR_DEPARTMENT_OTHER): Payer: Self-pay

## 2024-04-19 DIAGNOSIS — R1084 Generalized abdominal pain: Secondary | ICD-10-CM

## 2024-04-19 NOTE — Telephone Encounter (Signed)
 Patient is requesting a refill of the following medications: Requested Prescriptions   Pending Prescriptions Disp Refills   oxyCODONE  (OXY IR/ROXICODONE ) 5 MG immediate release tablet 90 tablet 0    Sig: Take 1 tablet (5 mg total) by mouth every 4 (four) hours as needed for severe pain (pain score 7-10).    Date of last refill: 04/04/24  Refill amount: 90/0, 15 day supply  Treatment agreement date: 04/04/2024

## 2024-04-25 ENCOUNTER — Other Ambulatory Visit: Payer: Self-pay | Admitting: Family

## 2024-04-25 ENCOUNTER — Other Ambulatory Visit (HOSPITAL_BASED_OUTPATIENT_CLINIC_OR_DEPARTMENT_OTHER): Payer: Self-pay

## 2024-04-25 DIAGNOSIS — R1084 Generalized abdominal pain: Secondary | ICD-10-CM

## 2024-04-25 NOTE — Telephone Encounter (Addendum)
 Epic LR: 04/04/2024 Pended Rx and sent to Dinah for refusal due to Too Soon

## 2024-04-26 ENCOUNTER — Ambulatory Visit: Admitting: Gastroenterology

## 2024-04-26 NOTE — Progress Notes (Deleted)
 Carlos [name]

## 2024-04-27 ENCOUNTER — Other Ambulatory Visit (HOSPITAL_BASED_OUTPATIENT_CLINIC_OR_DEPARTMENT_OTHER): Payer: Self-pay

## 2024-04-27 ENCOUNTER — Other Ambulatory Visit: Payer: Self-pay | Admitting: Family

## 2024-04-27 DIAGNOSIS — R1084 Generalized abdominal pain: Secondary | ICD-10-CM

## 2024-04-27 MED ORDER — OXYCODONE HCL 5 MG PO TABS
5.0000 mg | ORAL_TABLET | ORAL | 0 refills | Status: DC | PRN
  Filled 2024-04-27 – 2024-04-30 (×2): qty 60, 10d supply, fill #0

## 2024-04-27 NOTE — Telephone Encounter (Signed)
 Patient is asking for a refill for oxycodone . Patient has contract on file & prescription was ordered for 15 days on 04/04/2024.

## 2024-04-30 ENCOUNTER — Other Ambulatory Visit (HOSPITAL_BASED_OUTPATIENT_CLINIC_OR_DEPARTMENT_OTHER): Payer: Self-pay

## 2024-05-07 ENCOUNTER — Other Ambulatory Visit (HOSPITAL_BASED_OUTPATIENT_CLINIC_OR_DEPARTMENT_OTHER): Payer: Self-pay

## 2024-05-09 ENCOUNTER — Other Ambulatory Visit (HOSPITAL_BASED_OUTPATIENT_CLINIC_OR_DEPARTMENT_OTHER): Payer: Self-pay

## 2024-05-09 ENCOUNTER — Other Ambulatory Visit: Payer: Self-pay | Admitting: Nurse Practitioner

## 2024-05-09 DIAGNOSIS — R1084 Generalized abdominal pain: Secondary | ICD-10-CM

## 2024-05-09 MED ORDER — OXYCODONE HCL 5 MG PO TABS
5.0000 mg | ORAL_TABLET | ORAL | 0 refills | Status: DC | PRN
  Filled 2024-05-09: qty 60, 10d supply, fill #0

## 2024-05-09 NOTE — Telephone Encounter (Signed)
 Patient has request refill on controlled substance. Medication pend and sent to Jereld Delude, NP due to PCP [name], Roxan BROCKS, NP being out of office.

## 2024-05-14 ENCOUNTER — Ambulatory Visit: Payer: Medicaid Other | Admitting: Family

## 2024-05-15 NOTE — Progress Notes (Unsigned)
  Error

## 2024-05-16 ENCOUNTER — Encounter: Admitting: Adult Health

## 2024-05-16 NOTE — Progress Notes (Unsigned)
 This encounter was created in error - please disregard.

## 2024-05-18 ENCOUNTER — Other Ambulatory Visit: Payer: Self-pay | Admitting: Adult Health

## 2024-05-18 DIAGNOSIS — R1084 Generalized abdominal pain: Secondary | ICD-10-CM

## 2024-05-18 NOTE — Telephone Encounter (Signed)
 Patient is requesting a refill of the following medications: Requested Prescriptions   Pending Prescriptions Disp Refills   oxyCODONE  (OXY IR/ROXICODONE ) 5 MG immediate release tablet 60 tablet 0    Sig: Take 1 tablet (5 mg total) by mouth every 4 (four) hours as needed for severe pain (pain score 7-10).    Date of last refill: 05/09/24  Refill amount: 60  Treatment agreement date: 04/04/24

## 2024-05-21 ENCOUNTER — Other Ambulatory Visit (HOSPITAL_BASED_OUTPATIENT_CLINIC_OR_DEPARTMENT_OTHER): Payer: Self-pay

## 2024-05-21 ENCOUNTER — Other Ambulatory Visit: Payer: Self-pay | Admitting: Adult Health

## 2024-05-21 DIAGNOSIS — R1084 Generalized abdominal pain: Secondary | ICD-10-CM

## 2024-05-21 MED ORDER — OXYCODONE HCL 5 MG PO TABS
5.0000 mg | ORAL_TABLET | ORAL | 0 refills | Status: DC | PRN
  Filled 2024-05-21: qty 30, 5d supply, fill #0

## 2024-05-21 NOTE — Telephone Encounter (Signed)
Controlled substance 

## 2024-05-25 ENCOUNTER — Other Ambulatory Visit: Payer: Self-pay | Admitting: Adult Health

## 2024-05-25 ENCOUNTER — Other Ambulatory Visit (HOSPITAL_BASED_OUTPATIENT_CLINIC_OR_DEPARTMENT_OTHER): Payer: Self-pay

## 2024-05-25 DIAGNOSIS — R1084 Generalized abdominal pain: Secondary | ICD-10-CM

## 2024-05-25 NOTE — Telephone Encounter (Addendum)
 Signed 4 days ago (05/21/2024):  oxyCODONE  (OXY IR/ROXICODONE ) 5 MG immediate release tablet  Sig: Take 1 tablet (5 mg total) by mouth every 4 (four) hours as needed for severe pain (pain score 7-10).  Disp: 30 tablet    Refills: 0  Signed by: Medina-Vargas, Monina C, NP      Pended and sent to Monina for Approval/Denial

## 2024-05-25 NOTE — Telephone Encounter (Signed)
 Oxycodone  5 mg  #60 eRx on 05/09/24 and #30 on 05/21/24, Contract states #90 in 30 days.

## 2024-05-26 ENCOUNTER — Other Ambulatory Visit (HOSPITAL_BASED_OUTPATIENT_CLINIC_OR_DEPARTMENT_OTHER): Payer: Self-pay

## 2024-05-28 ENCOUNTER — Other Ambulatory Visit (HOSPITAL_BASED_OUTPATIENT_CLINIC_OR_DEPARTMENT_OTHER): Payer: Self-pay

## 2024-05-28 ENCOUNTER — Other Ambulatory Visit: Payer: Self-pay | Admitting: Adult Health

## 2024-05-28 ENCOUNTER — Other Ambulatory Visit: Payer: Self-pay | Admitting: Family

## 2024-05-28 DIAGNOSIS — R1084 Generalized abdominal pain: Secondary | ICD-10-CM

## 2024-05-28 NOTE — Telephone Encounter (Signed)
 Duplicate.Medication refilled by Monina Medina Vargus,NP

## 2024-05-28 NOTE — Telephone Encounter (Signed)
 Copied from CRM [id number]. Topic: Clinical - Medication Refill >> May 28, 2024 10:44 AM Adrianna P wrote: Medication: oxyCODONE  (OXY IR/ROXICODONE ) 5 MG immediate release tablet  Has the patient contacted their pharmacy? Yes (Agent: If no, request that the patient contact the pharmacy for the refill. If patient does not wish to contact the pharmacy document the reason why and proceed with request.) (Agent: If yes, when and what did the pharmacy advise?)  This is the patient's preferred pharmacy:  [facility] [address] [place] [address] Phone: [phone] Fax: [phone]  [facility] [address] [place] [address] Phone: [phone] Fax: [phone]  Is this the correct pharmacy for this prescription? Yes If no, delete pharmacy and type the correct one.   Has the prescription been filled recently? No  Is the patient out of the medication? Yes  Has the patient been seen for an appointment in the last year OR does the patient have an upcoming appointment? Yes  Can we respond through MyChart? Yes  Agent: Please be advised that Rx refills may take up to 3 business days. We ask that you follow-up with your pharmacy.

## 2024-05-28 NOTE — Telephone Encounter (Signed)
 Patient has request for refill on 05/25/2024. Patient last ordered 05/21/2024, doesn't state that order was signed. Patient has contract on file dated 04/05/2024. Patient doesn't have an upcoming appointment. Updated contract/Sign contract was added to appointment notes. Medication pend and sent to PCP ([name], Dinah C, NP) for approval.

## 2024-05-29 ENCOUNTER — Other Ambulatory Visit (HOSPITAL_BASED_OUTPATIENT_CLINIC_OR_DEPARTMENT_OTHER): Payer: Self-pay

## 2024-05-29 ENCOUNTER — Other Ambulatory Visit: Payer: Self-pay | Admitting: Adult Health

## 2024-05-29 DIAGNOSIS — R1084 Generalized abdominal pain: Secondary | ICD-10-CM

## 2024-05-29 NOTE — Telephone Encounter (Signed)
 Unable to refuse rx , contacted patient to see if he has picked up the rx due to duplicate request.

## 2024-05-30 ENCOUNTER — Other Ambulatory Visit: Payer: Self-pay | Admitting: Adult Health

## 2024-05-30 ENCOUNTER — Other Ambulatory Visit (HOSPITAL_BASED_OUTPATIENT_CLINIC_OR_DEPARTMENT_OTHER): Payer: Self-pay

## 2024-05-30 DIAGNOSIS — R1084 Generalized abdominal pain: Secondary | ICD-10-CM

## 2024-05-30 NOTE — Telephone Encounter (Signed)
 Please review previous encounter with Carlos [name] regarding this medication refill.

## 2024-05-30 NOTE — Telephone Encounter (Signed)
 Please see MyChart messages [name], Dinah C, NP // pt stated that he has already used the 30 day tablet of Oxycodone  that was sent to the pharmacy on 05/21/2024. Patient stated that he took the 5 day supply and is unable to understand why the rx is unable to refilled when he's only given a 5 day supply.

## 2024-05-30 NOTE — Telephone Encounter (Signed)
 Patient also stated he is unable to do pain management because he signed the contract with [name], Roxan BROCKS, NP

## 2024-05-31 ENCOUNTER — Other Ambulatory Visit (HOSPITAL_BASED_OUTPATIENT_CLINIC_OR_DEPARTMENT_OTHER): Payer: Self-pay

## 2024-05-31 ENCOUNTER — Other Ambulatory Visit: Payer: Self-pay | Admitting: Adult Health

## 2024-05-31 ENCOUNTER — Ambulatory Visit: Payer: Self-pay

## 2024-05-31 DIAGNOSIS — R1084 Generalized abdominal pain: Secondary | ICD-10-CM

## 2024-05-31 NOTE — Telephone Encounter (Signed)
 FYI Only or Action Required?: Action required by provider: medication refill request.  Patient was last seen in primary care on 04/04/2024 by [name], Roxan BROCKS, NP.  Called Nurse Triage reporting Medication Refill.  Symptoms began several days ago.  Interventions attempted: Nothing.  Symptoms are: unchanged.  Triage Disposition: Call PCP When Office is Open-needing a refill of oxycodone   Patient/caregiver understands and will follow disposition?: Yes  Copied from CRM [id number]. Topic: Clinical - Red Word Triage >> May 31, 2024  4:36 PM Mercer PEDLAR wrote: Red Word that prompted transfer to Nurse Triage: Severe pain, requesting refill for oxyCODONE  (OXY IR/ROXICODONE ) 5 MG immediate release tablet Reason for Disposition  Caller requesting a CONTROLLED substance prescription refill (e.g., narcotics, ADHD medicines)  Answer Assessment - Initial Assessment Questions 1. DRUG NAME: What medicine do you need to have refilled?     oxycodone  2. REFILLS REMAINING: How many refills are remaining? Notes: The label on the medicine or pill bottle will show how many refills are remaining. If there are no refills remaining, then a renewal may be needed.     0 3. EXPIRATION DATE: What is the expiration date? Note: The label states when the prescription will expire, and thus can no longer be refilled.)     11/17/2024 4. PRESCRIBER: Who prescribed it? Note: The prescribing doctor or group is responsible for refill approvals.SABRA Fredda Delude NP 5. PHARMACY: Have you contacted your pharmacy (drugstore)? Note: Some pharmacies will contact the doctor (or NP/PA).      yes 6. SYMPTOMS: Do you have any symptoms?     Upper abdominal pain  Patient has requested pain medication since 7/21. Patient is asking for pain medication refill. Med was pended by CMA today. Awaiting signature  Protocols used: Medication Refill and Renewal Call-A-AH

## 2024-05-31 NOTE — Telephone Encounter (Signed)
 ee Sebesta  You3 minutes ago (8:06 AM)    Im in a severe amount of pain. Can she please respond if possible    [name]  You22 hours ago (9:58 AM)    thank you

## 2024-06-01 ENCOUNTER — Other Ambulatory Visit: Payer: Self-pay

## 2024-06-01 ENCOUNTER — Other Ambulatory Visit (HOSPITAL_BASED_OUTPATIENT_CLINIC_OR_DEPARTMENT_OTHER): Payer: Self-pay

## 2024-06-01 MED ORDER — OXYCODONE HCL 5 MG PO TABS
5.0000 mg | ORAL_TABLET | ORAL | 0 refills | Status: DC | PRN
  Filled 2024-06-01: qty 60, 10d supply, fill #0

## 2024-06-01 NOTE — Telephone Encounter (Signed)
 Patient called stating he is in need of a refill on his oxycodone  as he was not given his full supply of 90 pills per month, Monina only approved #30.   Patient was placed on hold. I had a verbal conversation with [name], Dinah C, NP and she agreed to providing the additional 60 pills and stated patient need to get future refills from pain management.  Call resumed with patient, he was made aware of Dinah's response, and he states no one has contacted him from pain management. Patient aware I will have Tobias (referral coordinator follow up on referral as it was placed in May 2025)

## 2024-06-01 NOTE — Telephone Encounter (Signed)
 Medication refilled

## 2024-06-11 ENCOUNTER — Other Ambulatory Visit: Payer: Self-pay

## 2024-06-11 ENCOUNTER — Other Ambulatory Visit (HOSPITAL_BASED_OUTPATIENT_CLINIC_OR_DEPARTMENT_OTHER): Payer: Self-pay

## 2024-06-11 ENCOUNTER — Other Ambulatory Visit: Payer: Self-pay | Admitting: Family

## 2024-06-11 DIAGNOSIS — R1084 Generalized abdominal pain: Secondary | ICD-10-CM

## 2024-06-11 NOTE — Telephone Encounter (Signed)
 Patient is requesting a refill of the following medications: Requested Prescriptions   Pending Prescriptions Disp Refills   oxyCODONE  (OXY IR/ROXICODONE ) 5 MG immediate release tablet 60 tablet 0    Sig: Take 1 tablet (5 mg total) by mouth every 4 (four) hours as needed for severe pain (pain score 7-10).    Date of last refill: 06/01/24, remainder of 30 day supply was provided. Patient is still waiting for appt with pain management. Referral placed in May 2025, will CC referral coordinator to follow up on referral   Refill amount: 60  Treatment agreement date: May 2025

## 2024-06-12 ENCOUNTER — Other Ambulatory Visit: Payer: Self-pay | Admitting: Family

## 2024-06-12 DIAGNOSIS — R1084 Generalized abdominal pain: Secondary | ICD-10-CM

## 2024-06-12 NOTE — Telephone Encounter (Signed)
 Patient wants to know what he should do until he receive some sort of info from pain management. Ot needs refill on pain meds and is in pain.

## 2024-06-12 NOTE — Telephone Encounter (Signed)
 Patient hasn't received a referral done for pain management. Is there any update on this ?

## 2024-06-12 NOTE — Telephone Encounter (Signed)
 Carlos [name]  You56 minutes ago (1:46 PM)    I never received the pain management referral and I've been out of pain medication for 2 days   Sent information over to referral coordinator. In the meantime pt would like a refill on his medication oxycodone .

## 2024-06-12 NOTE — Telephone Encounter (Signed)
 Patient is requesting a refill of the following medications: Requested Prescriptions   Pending Prescriptions Disp Refills   oxyCODONE  (OXY IR/ROXICODONE ) 5 MG immediate release tablet 60 tablet 0    Sig: Take 1 tablet (5 mg total) by mouth every 4 (four) hours as needed for severe pain (pain score 7-10).    Date of last refill: 06/01/24  Refill amount: 0  Treatment agreement date: 04/04/24

## 2024-06-14 ENCOUNTER — Other Ambulatory Visit (HOSPITAL_BASED_OUTPATIENT_CLINIC_OR_DEPARTMENT_OTHER): Payer: Self-pay

## 2024-06-14 ENCOUNTER — Other Ambulatory Visit: Payer: Self-pay | Admitting: Family

## 2024-06-14 DIAGNOSIS — R1084 Generalized abdominal pain: Secondary | ICD-10-CM

## 2024-06-14 NOTE — Telephone Encounter (Signed)
 Patient is requesting a refill of the following medications: Requested Prescriptions   Pending Prescriptions Disp Refills   oxyCODONE  (OXY IR/ROXICODONE ) 5 MG immediate release tablet 60 tablet 0    Sig: Take 1 tablet (5 mg total) by mouth every 4 (four) hours as needed for severe pain (pain score 7-10).    Date of last refill: 06/01/24  Refill amount: 0  Treatment agreement date: 04/04/24

## 2024-06-14 NOTE — Telephone Encounter (Signed)
Too early to refill medication

## 2024-06-14 NOTE — Telephone Encounter (Signed)
 Please advise [name], Dinah C, NP

## 2024-06-14 NOTE — Telephone Encounter (Signed)
 Medication last filled 06/01/2024.

## 2024-06-14 NOTE — Telephone Encounter (Signed)
 Jeral stated that Carlos [name] reached out to the pain management office on 06/12/24 and did not get an answer so she left them a message. He said that he then called the pain management clinic himself later that day with no answer and he also left a message and is awaiting to hear something from them about getting an appointment set up. Patient states that he is in a lot of pain and wants to know what he is supposed to do in the meantime.

## 2024-06-14 NOTE — Telephone Encounter (Signed)
 Too soon to refill.

## 2024-06-15 NOTE — Telephone Encounter (Signed)
 Noted

## 2024-06-15 NOTE — Telephone Encounter (Signed)
 I'm reaching out again because I'm still dealing with pretty bad pain acute chronic pancreatitis. I was given a 10-day supply of oxycodone , and it's been about 15 days now. I've tried over-the-counter meds, but they haven't helped much, and the pain is starting to really affect my daily life.   I've also tried getting in touch with pain management, but I haven't heard anything back yet. Until they respond, I'm kind of stuck and really just trying to get through the days as best I can.   I understand the risks with pain meds and want to use them responsibly. I'm not looking for long-term use just some help managing this pain. Please let me know if you need anything from me to consider a short renewal.    Carlos [name]  You1 hour ago (10:01 AM)    Good morning, I received a 10 day supply and it's been passed 10, I think it's been 15

## 2024-06-18 NOTE — Telephone Encounter (Signed)
 Per pt : Carlos [name] to Me     06/18/24  9:22 AM I'm currently experiencing significant pain and it's been severe enough that I had to miss work. I'm really struggling to manage it and would appreciate it if you could call me as soon as possible

## 2024-06-19 NOTE — Telephone Encounter (Signed)
 The earliest we can fill medication is 06/25/2024.

## 2024-06-20 NOTE — Telephone Encounter (Signed)
 Noted

## 2024-06-21 ENCOUNTER — Other Ambulatory Visit (HOSPITAL_BASED_OUTPATIENT_CLINIC_OR_DEPARTMENT_OTHER): Payer: Self-pay

## 2024-06-21 ENCOUNTER — Telehealth (INDEPENDENT_AMBULATORY_CARE_PROVIDER_SITE_OTHER): Admitting: Family

## 2024-06-21 ENCOUNTER — Encounter: Payer: Self-pay | Admitting: Family

## 2024-06-21 ENCOUNTER — Other Ambulatory Visit (HOSPITAL_COMMUNITY): Payer: Self-pay

## 2024-06-21 DIAGNOSIS — G8929 Other chronic pain: Secondary | ICD-10-CM | POA: Diagnosis not present

## 2024-06-21 DIAGNOSIS — R109 Unspecified abdominal pain: Secondary | ICD-10-CM

## 2024-06-21 DIAGNOSIS — R1084 Generalized abdominal pain: Secondary | ICD-10-CM

## 2024-06-21 MED ORDER — OXYCODONE HCL 5 MG PO TABS
5.0000 mg | ORAL_TABLET | ORAL | 0 refills | Status: DC | PRN
  Filled 2024-06-21 (×2): qty 60, 10d supply, fill #0

## 2024-06-21 NOTE — Progress Notes (Signed)
 This service is provided via telemedicine  No vital signs collected/recorded due to the encounter was a telemedicine visit.   Location of patient (ex: home, work):  Home  Patient consents to a telephone visit: Yes  Location of the provider (ex: office, home):  [facility], Office   Name of any referring provider:  N/A  Names of all persons participating in the telemedicine service and their role in the encounter:  Shelli Ferrier, CMA, Patient, and Tyara Dassow, Roxan BROCKS, NP   Time spent on call:  9 min with medical assistant      Provider: Damean Poffenberger FNP-C  Seraj Dunnam, Roxan BROCKS, NP  Patient Care Team: Cyanne Delmar, Roxan BROCKS, NP as PCP - General (Family Medicine) Arlana Arnt, MD as Consulting Physician (Otolaryngology)  Extended Emergency Contact Information Primary Emergency Contact: brathwaite,naketa Address: [address]          [place], [place] [address] [place] Home Phone: [phone] Work Phone: [phone] Mobile Phone: [phone] Relation: Significant other  Code Status:  Full Code  Goals of care: Advanced Directive information    06/21/2024    2:12 PM  Advanced Directives  Does Patient Have a Medical Advance Directive? No  Would patient like information on creating a medical advance directive? No - Patient declined     Chief Complaint  Patient presents with   Medication Management    Discussed the use of AI scribe software for clinical note transcription with the patient, who gave verbal consent to proceed.  History of Present Illness   Carlos [name] is a 38 year old male who presents for medication management related to pain management.  He is experiencing issues with his Chronic abdominal pain. He was referred to pain management clinic on previous visit. The pain management clinic, which accepts his Medicaid insurance, has been unresponsive to his attempts to contact him. He has run out of his pain medication. He  is concerned about the timing of his medication refill, as he is due for a refill of oxycodone  5 mg in three days, on June 25, 2024.  He is currently prescribed oxycodone  5 mg but has been unable to secure an appointment with the pain management clinic to discuss ongoing management. He request refill on oxycodone  until he is able to be seen by pain management clinic.   Past Medical History:  Diagnosis Date   AKI (acute kidney injury) (HCC) 12/21/2021   Anxiety    Diabetes (HCC)    Pancreatitis    Splenic vein thrombosis    Past Surgical History:  Procedure Laterality Date   BILIARY BRUSHING  02/02/2023   Procedure: BILIARY BRUSHING;  Surgeon: Wilhelmenia Aloha Raddle., MD;  Location: [facility];  Service: Gastroenterology;;   BILIARY BRUSHING  04/28/2023   Procedure: BILIARY BRUSHING;  Surgeon: Aneita Gwendlyn DASEN, MD;  Location: [facility];  Service: Gastroenterology;;   BILIARY BRUSHING  06/02/2023   Procedure: BILIARY BRUSHING;  Surgeon: Wilhelmenia Aloha Raddle., MD;  Location: [facility];  Service: Gastroenterology;;   BILIARY BRUSHING  01/05/2024   Procedure: BILIARY BRUSHING;  Surgeon: Wilhelmenia Aloha Raddle., MD;  Location: [facility];  Service: Gastroenterology;;   BILIARY DILATION  12/01/2022   Procedure: BILIARY DILATION;  Surgeon: Wilhelmenia Aloha Raddle., MD;  Location: [facility];  Service: Gastroenterology;;   BILIARY DILATION  02/02/2023   Procedure: BILIARY DILATION;  Surgeon: Wilhelmenia Aloha Raddle., MD;  Location: [facility];  Service: Gastroenterology;;   BILIARY DILATION  01/05/2024  Procedure: BILIARY DILATION;  Surgeon: Wilhelmenia Aloha Raddle., MD;  Location: [facility];  Service: Gastroenterology;;   BILIARY STENT PLACEMENT  04/28/2023   Procedure: BILIARY STENT PLACEMENT;  Surgeon: Aneita Gwendlyn DASEN, MD;  Location: [facility];  Service: Gastroenterology;;   BILIARY STENT PLACEMENT N/A 06/02/2023   Procedure: BILIARY STENT PLACEMENT;  Surgeon:  Wilhelmenia Aloha Raddle., MD;  Location: [facility];  Service: Gastroenterology;  Laterality: N/A;   BIOPSY  12/02/2021   Procedure: BIOPSY;  Surgeon: Wilhelmenia Aloha Raddle., MD;  Location: [facility];  Service: Gastroenterology;;   ENDOSCOPIC RETROGRADE CHOLANGIOPANCREATOGRAPHY (ERCP) WITH PROPOFOL  N/A 08/12/2022   Procedure: ENDOSCOPIC RETROGRADE CHOLANGIOPANCREATOGRAPHY (ERCP) WITH PROPOFOL ;  Surgeon: Wilhelmenia Aloha Raddle., MD;  Location: [facility];  Service: Gastroenterology;  Laterality: N/A;   ENDOSCOPIC RETROGRADE CHOLANGIOPANCREATOGRAPHY (ERCP) WITH PROPOFOL  N/A 12/01/2022   Procedure: ENDOSCOPIC RETROGRADE CHOLANGIOPANCREATOGRAPHY (ERCP) WITH PROPOFOL ;  Surgeon: Wilhelmenia Aloha Raddle., MD;  Location: [facility];  Service: Gastroenterology;  Laterality: N/A;   ENDOSCOPIC RETROGRADE CHOLANGIOPANCREATOGRAPHY (ERCP) WITH PROPOFOL  N/A 02/02/2023   Procedure: ENDOSCOPIC RETROGRADE CHOLANGIOPANCREATOGRAPHY (ERCP) WITH PROPOFOL ;  Surgeon: Wilhelmenia Aloha Raddle., MD;  Location: [facility];  Service: Gastroenterology;  Laterality: N/A;   ENDOSCOPIC RETROGRADE CHOLANGIOPANCREATOGRAPHY (ERCP) WITH PROPOFOL  N/A 06/02/2023   Procedure: ENDOSCOPIC RETROGRADE CHOLANGIOPANCREATOGRAPHY (ERCP) WITH PROPOFOL ;  Surgeon: Wilhelmenia Aloha Raddle., MD;  Location: [facility];  Service: Gastroenterology;  Laterality: N/A;   ENDOSCOPIC RETROGRADE CHOLANGIOPANCREATOGRAPHY (ERCP) WITH PROPOFOL  N/A 01/05/2024   Procedure: ENDOSCOPIC RETROGRADE CHOLANGIOPANCREATOGRAPHY (ERCP) WITH PROPOFOL ;  Surgeon: Wilhelmenia Aloha Raddle., MD;  Location: [facility];  Service: Gastroenterology;  Laterality: N/A;   ERCP N/A 04/28/2023   Procedure: ENDOSCOPIC RETROGRADE CHOLANGIOPANCREATOGRAPHY (ERCP);  Surgeon: Aneita Gwendlyn DASEN, MD;  Location: [facility];  Service: Gastroenterology;  Laterality: N/A;   ESOPHAGOGASTRODUODENOSCOPY N/A 12/01/2022   Procedure: ESOPHAGOGASTRODUODENOSCOPY (EGD);  Surgeon: Wilhelmenia Aloha Raddle., MD;  Location: [facility];  Service: Gastroenterology;  Laterality: N/A;   ESOPHAGOGASTRODUODENOSCOPY (EGD) WITH PROPOFOL  N/A 12/02/2021   Procedure: ESOPHAGOGASTRODUODENOSCOPY (EGD) WITH PROPOFOL ;  Surgeon: Wilhelmenia Aloha Raddle., MD;  Location: [facility];  Service: Gastroenterology;  Laterality: N/A;   ESOPHAGOGASTRODUODENOSCOPY (EGD) WITH PROPOFOL  N/A 07/09/2023   Procedure: ESOPHAGOGASTRODUODENOSCOPY (EGD) WITH PROPOFOL ;  Surgeon: Abran Norleen SAILOR, MD;  Location: [facility];  Service: Gastroenterology;  Laterality: N/A;  needs ercp scope for stent pull   FINE NEEDLE ASPIRATION N/A 12/01/2022   Procedure: FINE NEEDLE ASPIRATION (FNA) LINEAR;  Surgeon: Wilhelmenia Aloha Raddle., MD;  Location: [facility];  Service: Gastroenterology;  Laterality: N/A;   NEUROLYTIC CELIAC PLEXUS  12/01/2022   Procedure: CELIAC PLEXUS BLOCK;  Surgeon: Wilhelmenia Aloha Raddle., MD;  Location: [facility];  Service: Gastroenterology;;   PANCREATIC STENT PLACEMENT  08/12/2022   Procedure: PANCREATIC STENT PLACEMENT;  Surgeon: Wilhelmenia Aloha Raddle., MD;  Location: [facility];  Service: Gastroenterology;;   PANCREATIC STENT PLACEMENT  12/01/2022   Procedure: PANCREATIC STENT PLACEMENT;  Surgeon: Wilhelmenia Aloha Raddle., MD;  Location: [facility];  Service: Gastroenterology;;   PANCREATIC STENT PLACEMENT  02/02/2023   Procedure: PANCREATIC STENT PLACEMENT;  Surgeon: Wilhelmenia Aloha Raddle., MD;  Location: [facility];  Service: Gastroenterology;;   PANCREATIC STENT PLACEMENT  06/02/2023   Procedure: PANCREATIC STENT PLACEMENT;  Surgeon: Wilhelmenia Aloha Raddle., MD;  Location: [facility];  Service: Gastroenterology;;   REMOVAL OF STONES  12/01/2022   Procedure: REMOVAL OF STONES;  Surgeon: Wilhelmenia Aloha Raddle., MD;  Location: [facility];  Service: Gastroenterology;;   REMOVAL OF STONES  02/02/2023   Procedure: REMOVAL OF STONES;  Surgeon: Wilhelmenia Aloha Raddle., MD;  Location: [facility];   Service: Gastroenterology;;   REMOVAL OF STONES  06/02/2023   Procedure: REMOVAL OF STONES;  Surgeon: Wilhelmenia Aloha Raddle., MD;  Location: [facility];  Service: Gastroenterology;;   REMOVAL OF STONES  01/05/2024   Procedure: REMOVAL OF SLUDGE;  Surgeon: Wilhelmenia Aloha Raddle., MD;  Location: [facility];  Service: Gastroenterology;;   ANNETT  08/12/2022   Procedure: ANNETT;  Surgeon: Wilhelmenia Aloha Raddle., MD;  Location: [facility];  Service: Gastroenterology;;   ANNETT  12/01/2022   Procedure: ANNETT;  Surgeon: Wilhelmenia Aloha Raddle., MD;  Location: [facility];  Service: Gastroenterology;;   ANNETT  06/02/2023   Procedure: ANNETT;  Surgeon: Wilhelmenia Aloha Raddle., MD;  Location: [facility];  Service: Gastroenterology;;   CLEDA REMOVAL  12/01/2022   Procedure: STENT REMOVAL;  Surgeon: Wilhelmenia Aloha Raddle., MD;  Location: [facility];  Service: Gastroenterology;;   CLEDA REMOVAL  02/02/2023   Procedure: STENT REMOVAL;  Surgeon: Wilhelmenia Aloha Raddle., MD;  Location: [facility];  Service: Gastroenterology;;   CLEDA REMOVAL  06/02/2023   Procedure: STENT REMOVAL;  Surgeon: Wilhelmenia Aloha Raddle., MD;  Location: [facility];  Service: Gastroenterology;;   CLEDA REMOVAL  07/09/2023   Procedure: STENT REMOVAL;  Surgeon: Abran Norleen SAILOR, MD;  Location: [facility];  Service: Gastroenterology;;   CLEDA REMOVAL  01/05/2024   Procedure: STENT REMOVAL;  Surgeon: Wilhelmenia Aloha Raddle., MD;  Location: [facility];  Service: Gastroenterology;;   UPPER ESOPHAGEAL ENDOSCOPIC ULTRASOUND (EUS) N/A 12/02/2021   Procedure: UPPER ESOPHAGEAL ENDOSCOPIC ULTRASOUND (EUS);  Surgeon: Wilhelmenia Aloha Raddle., MD;  Location: [facility];  Service: Gastroenterology;  Laterality: N/A;   UPPER ESOPHAGEAL ENDOSCOPIC ULTRASOUND (EUS) N/A 12/01/2022   Procedure: UPPER ESOPHAGEAL ENDOSCOPIC ULTRASOUND (EUS);  Surgeon: Wilhelmenia Aloha Raddle., MD;   Location: [facility];  Service: Gastroenterology;  Laterality: N/A;    Allergies  Allergen Reactions   Other Anaphylaxis and Other (See Comments)    [name] - anaphylaxis    Outpatient Encounter Medications as of 06/21/2024  Medication Sig   carvedilol  (COREG ) 12.5 MG tablet Take 1 tablet (12.5 mg total) by mouth 2 (two) times daily with a meal.   Continuous Glucose Receiver (DEXCOM G7 RECEIVER) DEVI Use as directed.   Continuous Glucose Sensor (DEXCOM G7 SENSOR) MISC Use as directed and change every 10 days.   gabapentin  (NEURONTIN ) 100 MG capsule Take 1 capsule (100 mg total) by mouth 2 (two) times daily.   insulin  aspart (NOVOLOG  FLEXPEN) 100 UNIT/ML FlexPen Max daily 30 units   insulin  glargine (LANTUS  SOLOSTAR) 100 UNIT/ML Solostar Pen Inject 16 Units into the skin daily.   Insulin  Pen Needle 32G X 4 MM MISC 1 Device by Does not apply route in the morning, at noon, in the evening, and at bedtime.   lipase/protease/amylase (CREON ) 36000 UNITS CPEP capsule Take 1-2 capsules (36,000-72,000 Units total) by mouth See admin instructions. Take 72,000 units by mouth three times a day before meals and 36,000 units with any snacks   multivitamin (ONE-A-DAY MEN'S) TABS tablet Take 1 tablet by mouth daily with breakfast.   omeprazole  (PRILOSEC) 40 MG capsule TAKE 1 CAPSULE (40 MG TOTAL) BY MOUTH DAILY.   potassium citrate  (UROCIT-K ) 10 MEQ (1080 MG) SR tablet Take 1 tablet (10 mEq total) by mouth 2 (two) times daily after a meal.   Continuous Glucose Sensor (DEXCOM G7 SENSOR) MISC 1 Device by Does not apply route as directed. (Patient not taking: Reported on 02/16/2024)   naloxone  (NARCAN ) nasal spray 4 mg/0.1 mL Place  1 spray into the nose once as needed (Overdose).   oxyCODONE  (OXY IR/ROXICODONE ) 5 MG immediate release tablet Take 1 tablet (5 mg total) by mouth every 4 (four) hours as needed for severe pain (pain score 7-10).   [DISCONTINUED] oxyCODONE  (OXY IR/ROXICODONE ) 5 MG immediate  release tablet Take 1 tablet (5 mg total) by mouth every 4 (four) hours as needed for severe pain (pain score 7-10).   No facility-administered encounter medications on file as of 06/21/2024.    Review of Systems  Constitutional:  Negative for appetite change, chills, fatigue, fever and unexpected weight change.  Eyes:  Negative for pain, discharge, redness, itching and visual disturbance.  Respiratory:  Negative for cough, chest tightness, shortness of breath and wheezing.   Cardiovascular:  Negative for chest pain, palpitations and leg swelling.  Gastrointestinal:  Positive for abdominal pain. Negative for abdominal distention, blood in stool, constipation, diarrhea, nausea and vomiting.  Musculoskeletal:  Negative for arthralgias, back pain, gait problem, joint swelling and myalgias.  Neurological:  Negative for dizziness, weakness, numbness and headaches.    Immunization History  Administered Date(s) Administered   Influenza,inj,Quad PF,6+ Mos 09/15/2021   Influenza-Unspecified 08/09/2022   Tdap 10/08/2022   Pertinent  Health Maintenance Due  Topic Date Due   OPHTHALMOLOGY EXAM  Never done   HEMOGLOBIN A1C  03/18/2024   INFLUENZA VACCINE  06/08/2024   FOOT EXAM  09/18/2024      07/06/2023    8:59 AM 11/07/2023    9:55 AM 02/16/2024    8:03 AM 04/04/2024   10:52 AM 06/21/2024    2:12 PM  Fall Risk  Falls in the past year? 0 0 0 0 0  Was there an injury with Fall? 0 0 0 0 0  Fall Risk Category Calculator 0 0 0 0 0  Patient at Risk for Falls Due to No Fall Risks   No Fall Risks No Fall Risks  Fall risk Follow up Falls evaluation completed;Education provided;Falls prevention discussed  Falls evaluation completed Falls evaluation completed Falls evaluation completed   Functional Status Survey:    There were no vitals filed for this visit. There is no height or weight on file to calculate BMI. Physical Exam Constitutional:      General: He is not in acute distress.     Appearance: Normal appearance. He is not ill-appearing.  Pulmonary:     Effort: Pulmonary effort is normal. No respiratory distress.  Neurological:     Mental Status: He is alert and oriented to person, place, and time.  Psychiatric:        Mood and Affect: Mood normal.        Behavior: Behavior normal.        Labs reviewed: Recent Labs    08/28/23 0340 08/29/23 0428 11/07/23 1034 03/28/24 1157 03/29/24 0554 04/04/24 1146  NA 137 133*   < > 134* 128* 139  K 4.0 3.7   < > 3.3* 3.7 3.7  CL 103 98   < > 104 96* 96*  CO2 26 24   < > 22 22 28   GLUCOSE 213* 184*   < > 134* 206* 324*  BUN 8 7   < > 6 <5* 4*  CREATININE 0.67 0.66   < > 0.59* 0.62 0.67  CALCIUM  8.8* 9.4   < > 10.3 9.7 9.3  MG 1.9 1.9  --   --   --   --    < > = values in this interval not  displayed.   Recent Labs    03/27/24 1608 03/28/24 0328 03/29/24 0554 04/04/24 1146  AST 65* 33 26 56*  ALT 72* 45* 34 38  ALKPHOS 179* 112 97  --   BILITOT 2.2* 1.1 1.1 0.5  PROT 10.4* 7.1 6.4* 6.5  ALBUMIN  5.4* 3.9 3.2*  --    Recent Labs    11/07/23 1034 03/27/24 1608 03/28/24 0328 03/29/24 0554 04/04/24 1146  WBC 3.0* 14.3* 9.3 7.2 3.3*  NEUTROABS 1,926 12.8*  --   --  1,558  HGB 14.0 16.6 12.7* 12.3* 12.1*  HCT 41.8 50.4 39.0 36.7* 35.7*  MCV 104.8* 111.3* 111.1* 107.3* 107.5*  PLT 169 284 158 144* 282   Lab Results  Component Value Date   TSH 0.50 11/07/2023   Lab Results  Component Value Date   HGBA1C 7.9 (A) 09/19/2023   Lab Results  Component Value Date   CHOL 104 03/10/2022   HDL 56.50 03/10/2022   LDLCALC 18 03/10/2022   TRIG 153 (H) 07/20/2022   CHOLHDL 2 03/10/2022    Significant Diagnostic Results in last 30 days:  No results found.  Assessment/Plan    Chronic pain Experiencing chronic pain, currently managed with oxycodone  5 mg. Unable to access a pain management clinic that accepts Medicaid insurance. Medication refill is due on June 25, 2024, necessitating continuity of  care. - Send prescription for oxycodone  5 mg to [facility]. - Ensure he schedules an appointment with the pain management clinic before the next refill. - Verify contact information for [facility] on [facility] and provide it to him. - Send a message to the referral coordinator to follow up with the pain management clinic regarding insurance or communication issues.       Family/ staff Communication: Reviewed plan of care with patient verbalized understanding   Labs/tests ordered: None   Next Appointment: No follow-ups on file.  Spent 20 minutes of face to face on video with patient  >50% time spent counseling; reviewing medical record; tests; labs; and developing future plan of ca   Roxan JAYSON Plough, NP

## 2024-07-02 ENCOUNTER — Other Ambulatory Visit: Payer: Self-pay

## 2024-07-02 ENCOUNTER — Other Ambulatory Visit: Payer: Self-pay | Admitting: Family

## 2024-07-02 DIAGNOSIS — R1084 Generalized abdominal pain: Secondary | ICD-10-CM

## 2024-07-02 NOTE — Telephone Encounter (Signed)
 Oxycodone  last filled on 06/21/2024 will be due 07/22/2024

## 2024-07-02 NOTE — Telephone Encounter (Signed)
 Patient has requested too soon.  Last Refill: 06/21/2024  Pended and sent to [facility] for approval/refusal.

## 2024-07-02 NOTE — Telephone Encounter (Signed)
 Patient is requesting refill , last refill was signed a week ago.

## 2024-07-03 ENCOUNTER — Other Ambulatory Visit: Payer: Self-pay | Admitting: Family

## 2024-07-03 ENCOUNTER — Other Ambulatory Visit (HOSPITAL_BASED_OUTPATIENT_CLINIC_OR_DEPARTMENT_OTHER): Payer: Self-pay

## 2024-07-03 DIAGNOSIS — R1084 Generalized abdominal pain: Secondary | ICD-10-CM

## 2024-07-03 NOTE — Telephone Encounter (Signed)
 Pharmacy requested refill.  Refill Too Soon. Denied yesterday and today already. Pended and sent to [facility] for Approval/Denial.

## 2024-07-03 NOTE — Telephone Encounter (Signed)
 Too soon to refill Oxycodone .

## 2024-07-03 NOTE — Telephone Encounter (Signed)
Pt is requesting again

## 2024-07-04 ENCOUNTER — Other Ambulatory Visit (HOSPITAL_BASED_OUTPATIENT_CLINIC_OR_DEPARTMENT_OTHER): Payer: Self-pay

## 2024-07-04 ENCOUNTER — Other Ambulatory Visit: Payer: Self-pay | Admitting: Family

## 2024-07-04 DIAGNOSIS — R1084 Generalized abdominal pain: Secondary | ICD-10-CM

## 2024-07-04 NOTE — Telephone Encounter (Signed)
 I tried to call patient, no answer, and no voicemail. Mychart message sent to patient with a status update on Oxycodone  refill request.  Medical assistants are not authorized to deny refill request for Oxycodone , so I will send to [name], Roxan BROCKS, NP to deny

## 2024-07-05 ENCOUNTER — Other Ambulatory Visit (HOSPITAL_BASED_OUTPATIENT_CLINIC_OR_DEPARTMENT_OTHER): Payer: Self-pay

## 2024-07-05 ENCOUNTER — Other Ambulatory Visit: Payer: Self-pay | Admitting: Family

## 2024-07-05 DIAGNOSIS — R1084 Generalized abdominal pain: Secondary | ICD-10-CM

## 2024-07-06 ENCOUNTER — Other Ambulatory Visit: Payer: Self-pay | Admitting: Family

## 2024-07-06 DIAGNOSIS — R1084 Generalized abdominal pain: Secondary | ICD-10-CM

## 2024-07-06 NOTE — Telephone Encounter (Signed)
 Patient has requested medication too soon.

## 2024-07-06 NOTE — Telephone Encounter (Signed)
 Pharmacy requested refill.  Epic LR: 06/21/2024 Rx too early  Pended and sent to [facility] for approval/denial.

## 2024-07-10 ENCOUNTER — Other Ambulatory Visit: Payer: Self-pay | Admitting: Family

## 2024-07-10 ENCOUNTER — Other Ambulatory Visit (HOSPITAL_BASED_OUTPATIENT_CLINIC_OR_DEPARTMENT_OTHER): Payer: Self-pay

## 2024-07-10 DIAGNOSIS — R1084 Generalized abdominal pain: Secondary | ICD-10-CM

## 2024-07-10 NOTE — Telephone Encounter (Signed)
 Too soon to refill medication due 07/26/2024.

## 2024-07-10 NOTE — Telephone Encounter (Signed)
 Patient is requesting a refill of the following medications: Requested Prescriptions   Pending Prescriptions Disp Refills   oxyCODONE  (OXY IR/ROXICODONE ) 5 MG immediate release tablet 60 tablet 0    Sig: Take 1 tablet (5 mg total) by mouth every 4 (four) hours as needed for severe pain (pain score 7-10).    Date of last refill: 06/21/24  Refill amount: 60  Treatment agreement date: 04/04/24

## 2024-07-11 ENCOUNTER — Other Ambulatory Visit (HOSPITAL_BASED_OUTPATIENT_CLINIC_OR_DEPARTMENT_OTHER): Payer: Self-pay

## 2024-07-11 ENCOUNTER — Other Ambulatory Visit: Payer: Self-pay | Admitting: Family

## 2024-07-11 DIAGNOSIS — R1084 Generalized abdominal pain: Secondary | ICD-10-CM

## 2024-07-11 NOTE — Telephone Encounter (Signed)
 Outgoing call placed to pain management and they stated patients referral was received, however not scheduled as of yet. Patient will be contacted soon by pain management (no approximate timeframe provided).   Outgoing call placed to the patient to inform him that Roxan stated that she will not be refilling the prescription before 07/22/24 and if she continues to receive daily refill request for this medication then she will have to think about dismissing him from the practice. I also informed him that we reached out to the pain management clinic and they stated that they would contact him soon to get him scheduled. He verbalized understanding of our conversation.

## 2024-07-11 NOTE — Telephone Encounter (Signed)
 Please call and Notify patient that oxycodone  was last filled on 06/21/2024 and will be filled next on 07/22/2024 any early request violates the narcotic signed contract.

## 2024-07-11 NOTE — Telephone Encounter (Signed)
 Patient is requesting a refill of the following medications: Requested Prescriptions   Pending Prescriptions Disp Refills   oxyCODONE  (OXY IR/ROXICODONE ) 5 MG immediate release tablet 60 tablet 0    Sig: Take 1 tablet (5 mg total) by mouth every 4 (four) hours as needed for severe pain (pain score 7-10).    Date of last refill: 06/21/24  Refill amount: 60  Treatment agreement date: 04/04/24

## 2024-07-12 ENCOUNTER — Other Ambulatory Visit (HOSPITAL_BASED_OUTPATIENT_CLINIC_OR_DEPARTMENT_OTHER): Payer: Self-pay

## 2024-07-23 ENCOUNTER — Other Ambulatory Visit: Payer: Self-pay

## 2024-07-23 ENCOUNTER — Other Ambulatory Visit (HOSPITAL_BASED_OUTPATIENT_CLINIC_OR_DEPARTMENT_OTHER): Payer: Self-pay

## 2024-07-23 ENCOUNTER — Other Ambulatory Visit: Payer: Self-pay | Admitting: Family

## 2024-07-23 DIAGNOSIS — R1084 Generalized abdominal pain: Secondary | ICD-10-CM

## 2024-07-23 MED ORDER — OXYCODONE HCL 5 MG PO TABS
5.0000 mg | ORAL_TABLET | ORAL | 0 refills | Status: DC | PRN
  Filled 2024-07-23 (×2): qty 60, 10d supply, fill #0

## 2024-07-23 NOTE — Telephone Encounter (Signed)
Controlled substance 

## 2024-07-24 ENCOUNTER — Other Ambulatory Visit (HOSPITAL_BASED_OUTPATIENT_CLINIC_OR_DEPARTMENT_OTHER): Payer: Self-pay

## 2024-07-26 ENCOUNTER — Other Ambulatory Visit (HOSPITAL_BASED_OUTPATIENT_CLINIC_OR_DEPARTMENT_OTHER): Payer: Self-pay

## 2024-07-27 ENCOUNTER — Other Ambulatory Visit (HOSPITAL_BASED_OUTPATIENT_CLINIC_OR_DEPARTMENT_OTHER): Payer: Self-pay

## 2024-07-30 NOTE — Telephone Encounter (Signed)
Message routed to PCP [name], Dinah C, NP  

## 2024-07-31 NOTE — Telephone Encounter (Signed)
 MyChart message sent to patient.

## 2024-07-31 NOTE — Telephone Encounter (Signed)
 Pain medication was signed for 60 tablet per month then you were to follow up with pain management.

## 2024-08-01 ENCOUNTER — Other Ambulatory Visit: Payer: Self-pay

## 2024-08-01 ENCOUNTER — Encounter (HOSPITAL_COMMUNITY): Payer: Self-pay

## 2024-08-01 ENCOUNTER — Inpatient Hospital Stay (HOSPITAL_COMMUNITY)
Admission: EM | Admit: 2024-08-01 | Discharge: 2024-08-04 | DRG: 637 | Disposition: A | Discharge status: Home / Self Care | Attending: Internal Medicine | Admitting: Internal Medicine

## 2024-08-01 ENCOUNTER — Emergency Department (HOSPITAL_COMMUNITY)

## 2024-08-01 DIAGNOSIS — K859 Acute pancreatitis without necrosis or infection, unspecified: Secondary | ICD-10-CM

## 2024-08-01 DIAGNOSIS — Z86718 Personal history of other venous thrombosis and embolism: Secondary | ICD-10-CM

## 2024-08-01 DIAGNOSIS — I81 Portal vein thrombosis: Secondary | ICD-10-CM | POA: Diagnosis present

## 2024-08-01 DIAGNOSIS — E1065 Type 1 diabetes mellitus with hyperglycemia: Secondary | ICD-10-CM | POA: Diagnosis present

## 2024-08-01 DIAGNOSIS — K219 Gastro-esophageal reflux disease without esophagitis: Secondary | ICD-10-CM | POA: Diagnosis present

## 2024-08-01 DIAGNOSIS — Z8719 Personal history of other diseases of the digestive system: Secondary | ICD-10-CM

## 2024-08-01 DIAGNOSIS — K861 Other chronic pancreatitis: Secondary | ICD-10-CM | POA: Diagnosis present

## 2024-08-01 DIAGNOSIS — Z791 Long term (current) use of non-steroidal anti-inflammatories (NSAID): Secondary | ICD-10-CM

## 2024-08-01 DIAGNOSIS — Z79899 Other long term (current) drug therapy: Secondary | ICD-10-CM

## 2024-08-01 DIAGNOSIS — E1110 Type 2 diabetes mellitus with ketoacidosis without coma: Principal | ICD-10-CM | POA: Diagnosis present

## 2024-08-01 DIAGNOSIS — R0789 Other chest pain: Secondary | ICD-10-CM | POA: Diagnosis present

## 2024-08-01 DIAGNOSIS — K760 Fatty (change of) liver, not elsewhere classified: Secondary | ICD-10-CM | POA: Diagnosis present

## 2024-08-01 DIAGNOSIS — Z794 Long term (current) use of insulin: Secondary | ICD-10-CM

## 2024-08-01 DIAGNOSIS — K8020 Calculus of gallbladder without cholecystitis without obstruction: Secondary | ICD-10-CM | POA: Diagnosis present

## 2024-08-01 DIAGNOSIS — R933 Abnormal findings on diagnostic imaging of other parts of digestive tract: Secondary | ICD-10-CM | POA: Diagnosis present

## 2024-08-01 DIAGNOSIS — R7401 Elevation of levels of liver transaminase levels: Secondary | ICD-10-CM | POA: Diagnosis present

## 2024-08-01 DIAGNOSIS — F17290 Nicotine dependence, other tobacco product, uncomplicated: Secondary | ICD-10-CM | POA: Diagnosis present

## 2024-08-01 DIAGNOSIS — K8021 Calculus of gallbladder without cholecystitis with obstruction: Secondary | ICD-10-CM | POA: Diagnosis present

## 2024-08-01 DIAGNOSIS — E1010 Type 1 diabetes mellitus with ketoacidosis without coma: Principal | ICD-10-CM | POA: Diagnosis present

## 2024-08-01 DIAGNOSIS — R7989 Other specified abnormal findings of blood chemistry: Secondary | ICD-10-CM | POA: Diagnosis present

## 2024-08-01 DIAGNOSIS — I10 Essential (primary) hypertension: Secondary | ICD-10-CM | POA: Diagnosis present

## 2024-08-01 DIAGNOSIS — E1043 Type 1 diabetes mellitus with diabetic autonomic (poly)neuropathy: Secondary | ICD-10-CM | POA: Diagnosis present

## 2024-08-01 DIAGNOSIS — K269 Duodenal ulcer, unspecified as acute or chronic, without hemorrhage or perforation: Secondary | ICD-10-CM | POA: Diagnosis present

## 2024-08-01 DIAGNOSIS — R1084 Generalized abdominal pain: Secondary | ICD-10-CM

## 2024-08-01 DIAGNOSIS — Z8249 Family history of ischemic heart disease and other diseases of the circulatory system: Secondary | ICD-10-CM

## 2024-08-01 DIAGNOSIS — Z87898 Personal history of other specified conditions: Secondary | ICD-10-CM

## 2024-08-01 DIAGNOSIS — G8929 Other chronic pain: Secondary | ICD-10-CM | POA: Diagnosis present

## 2024-08-01 DIAGNOSIS — R1011 Right upper quadrant pain: Secondary | ICD-10-CM | POA: Diagnosis present

## 2024-08-01 DIAGNOSIS — K863 Pseudocyst of pancreas: Secondary | ICD-10-CM | POA: Diagnosis present

## 2024-08-01 DIAGNOSIS — K2980 Duodenitis without bleeding: Secondary | ICD-10-CM | POA: Diagnosis present

## 2024-08-01 DIAGNOSIS — K3184 Gastroparesis: Secondary | ICD-10-CM | POA: Diagnosis present

## 2024-08-01 DIAGNOSIS — Z833 Family history of diabetes mellitus: Secondary | ICD-10-CM

## 2024-08-01 DIAGNOSIS — F419 Anxiety disorder, unspecified: Secondary | ICD-10-CM | POA: Diagnosis present

## 2024-08-01 NOTE — ED Triage Notes (Signed)
 Pt presents via POV c/o chest pain x3 weeks. Reports worse today. Reports pain in chest is similar to a pancreatitis flare he had previously.

## 2024-08-02 ENCOUNTER — Inpatient Hospital Stay (HOSPITAL_COMMUNITY)

## 2024-08-02 ENCOUNTER — Encounter (HOSPITAL_COMMUNITY): Payer: Self-pay | Admitting: Radiology

## 2024-08-02 DIAGNOSIS — E1043 Type 1 diabetes mellitus with diabetic autonomic (poly)neuropathy: Secondary | ICD-10-CM | POA: Diagnosis present

## 2024-08-02 DIAGNOSIS — R7989 Other specified abnormal findings of blood chemistry: Secondary | ICD-10-CM | POA: Diagnosis present

## 2024-08-02 DIAGNOSIS — K863 Pseudocyst of pancreas: Secondary | ICD-10-CM | POA: Diagnosis present

## 2024-08-02 DIAGNOSIS — K269 Duodenal ulcer, unspecified as acute or chronic, without hemorrhage or perforation: Secondary | ICD-10-CM | POA: Diagnosis present

## 2024-08-02 DIAGNOSIS — E1010 Type 1 diabetes mellitus with ketoacidosis without coma: Secondary | ICD-10-CM | POA: Diagnosis present

## 2024-08-02 DIAGNOSIS — K760 Fatty (change of) liver, not elsewhere classified: Secondary | ICD-10-CM | POA: Diagnosis present

## 2024-08-02 DIAGNOSIS — Z8249 Family history of ischemic heart disease and other diseases of the circulatory system: Secondary | ICD-10-CM | POA: Diagnosis not present

## 2024-08-02 DIAGNOSIS — K8021 Calculus of gallbladder without cholecystitis with obstruction: Secondary | ICD-10-CM | POA: Diagnosis present

## 2024-08-02 DIAGNOSIS — E1165 Type 2 diabetes mellitus with hyperglycemia: Secondary | ICD-10-CM | POA: Diagnosis not present

## 2024-08-02 DIAGNOSIS — I81 Portal vein thrombosis: Secondary | ICD-10-CM | POA: Diagnosis present

## 2024-08-02 DIAGNOSIS — R1011 Right upper quadrant pain: Secondary | ICD-10-CM | POA: Diagnosis not present

## 2024-08-02 DIAGNOSIS — K3189 Other diseases of stomach and duodenum: Secondary | ICD-10-CM | POA: Diagnosis not present

## 2024-08-02 DIAGNOSIS — K3184 Gastroparesis: Secondary | ICD-10-CM | POA: Diagnosis present

## 2024-08-02 DIAGNOSIS — R109 Unspecified abdominal pain: Secondary | ICD-10-CM | POA: Diagnosis present

## 2024-08-02 DIAGNOSIS — K861 Other chronic pancreatitis: Secondary | ICD-10-CM | POA: Diagnosis present

## 2024-08-02 DIAGNOSIS — G8929 Other chronic pain: Secondary | ICD-10-CM | POA: Diagnosis present

## 2024-08-02 DIAGNOSIS — F419 Anxiety disorder, unspecified: Secondary | ICD-10-CM | POA: Diagnosis present

## 2024-08-02 DIAGNOSIS — Z833 Family history of diabetes mellitus: Secondary | ICD-10-CM | POA: Diagnosis not present

## 2024-08-02 DIAGNOSIS — Z8719 Personal history of other diseases of the digestive system: Secondary | ICD-10-CM | POA: Diagnosis not present

## 2024-08-02 DIAGNOSIS — I10 Essential (primary) hypertension: Secondary | ICD-10-CM | POA: Diagnosis present

## 2024-08-02 DIAGNOSIS — Z79899 Other long term (current) drug therapy: Secondary | ICD-10-CM | POA: Diagnosis not present

## 2024-08-02 DIAGNOSIS — R0789 Other chest pain: Secondary | ICD-10-CM | POA: Diagnosis present

## 2024-08-02 DIAGNOSIS — Z794 Long term (current) use of insulin: Secondary | ICD-10-CM | POA: Diagnosis not present

## 2024-08-02 DIAGNOSIS — Z86718 Personal history of other venous thrombosis and embolism: Secondary | ICD-10-CM | POA: Diagnosis not present

## 2024-08-02 DIAGNOSIS — F17290 Nicotine dependence, other tobacco product, uncomplicated: Secondary | ICD-10-CM | POA: Diagnosis present

## 2024-08-02 DIAGNOSIS — Z87898 Personal history of other specified conditions: Secondary | ICD-10-CM | POA: Diagnosis not present

## 2024-08-02 DIAGNOSIS — K219 Gastro-esophageal reflux disease without esophagitis: Secondary | ICD-10-CM | POA: Diagnosis present

## 2024-08-02 DIAGNOSIS — K2980 Duodenitis without bleeding: Secondary | ICD-10-CM | POA: Diagnosis present

## 2024-08-02 LAB — BASIC METABOLIC PANEL WITH GFR
Anion gap: 28 — ABNORMAL HIGH (ref 5–15)
Anion gap: 14 (ref 5–15)
Anion gap: 11 (ref 5–15)
BUN: <5 mg/dL — ABNORMAL LOW (ref 6–20)
BUN: <5 mg/dL — ABNORMAL LOW (ref 6–20)
BUN: <5 mg/dL — ABNORMAL LOW (ref 6–20)
CO2: 13 mmol/L — ABNORMAL LOW (ref 22–32)
CO2: 17 mmol/L — ABNORMAL LOW (ref 22–32)
CO2: 22 mmol/L (ref 22–32)
Calcium: 9.6 mg/dL (ref 8.9–10.3)
Calcium: 8.7 mg/dL — ABNORMAL LOW (ref 8.9–10.3)
Calcium: 8.9 mg/dL (ref 8.9–10.3)
Chloride: 94 mmol/L — ABNORMAL LOW (ref 98–111)
Chloride: 103 mmol/L (ref 98–111)
Chloride: 101 mmol/L (ref 98–111)
Creatinine, Ser: 0.78 mg/dL (ref 0.61–1.24)
Creatinine, Ser: 0.65 mg/dL (ref 0.61–1.24)
Creatinine, Ser: 0.71 mg/dL (ref 0.61–1.24)
GFR, Estimated: >60 mL/min (ref >60)
GFR, Estimated: >60 mL/min (ref >60)
GFR, Estimated: >60 mL/min (ref >60)
Glucose, Bld: 196 mg/dL — ABNORMAL HIGH (ref 70–99)
Glucose, Bld: 127 mg/dL — ABNORMAL HIGH (ref 70–99)
Glucose, Bld: 133 mg/dL — ABNORMAL HIGH (ref 70–99)
Potassium: 3.7 mmol/L (ref 3.5–5.1)
Potassium: 3.4 mmol/L — ABNORMAL LOW (ref 3.5–5.1)
Potassium: 4 mmol/L (ref 3.5–5.1)
Sodium: 135 mmol/L (ref 135–145)
Sodium: 134 mmol/L — ABNORMAL LOW (ref 135–145)
Sodium: 134 mmol/L — ABNORMAL LOW (ref 135–145)

## 2024-08-02 LAB — URINE DRUG SCREEN
Amphetamines: NEGATIVE
Barbiturates: NEGATIVE
Benzodiazepines: NEGATIVE
Cocaine: NEGATIVE
Fentanyl: POSITIVE — AB
Methadone Scn, Ur: NEGATIVE
Opiates: NEGATIVE
Tetrahydrocannabinol: NEGATIVE

## 2024-08-02 LAB — CBC
HCT: 35.6 % — ABNORMAL LOW (ref 39.0–52.0)
HCT: 37.7 % — ABNORMAL LOW (ref 39.0–52.0)
HCT: 39.1 % (ref 39.0–52.0)
Hemoglobin: 11.4 g/dL — ABNORMAL LOW (ref 13.0–17.0)
Hemoglobin: 12.2 g/dL — ABNORMAL LOW (ref 13.0–17.0)
Hemoglobin: 13.3 g/dL (ref 13.0–17.0)
MCH: 34.6 pg — ABNORMAL HIGH (ref 26.0–34.0)
MCH: 34.7 pg — ABNORMAL HIGH (ref 26.0–34.0)
MCH: 36.4 pg — ABNORMAL HIGH (ref 26.0–34.0)
MCHC: 32 g/dL (ref 30.0–36.0)
MCHC: 32.4 g/dL (ref 30.0–36.0)
MCHC: 34 g/dL (ref 30.0–36.0)
MCV: 106.8 fL — ABNORMAL HIGH (ref 80.0–100.0)
MCV: 107.1 fL — ABNORMAL HIGH (ref 80.0–100.0)
MCV: 108.2 fL — ABNORMAL HIGH (ref 80.0–100.0)
Platelets: 135 K/uL — ABNORMAL LOW (ref 150–400)
Platelets: 154 K/uL (ref 150–400)
Platelets: 175 K/uL (ref 150–400)
RBC: 3.29 MIL/uL — ABNORMAL LOW (ref 4.22–5.81)
RBC: 3.53 MIL/uL — ABNORMAL LOW (ref 4.22–5.81)
RBC: 3.65 MIL/uL — ABNORMAL LOW (ref 4.22–5.81)
RDW: 12.4 % (ref 11.5–15.5)
RDW: 12.5 % (ref 11.5–15.5)
RDW: 12.7 % (ref 11.5–15.5)
WBC: 5.3 K/uL (ref 4.0–10.5)
WBC: 5.5 K/uL (ref 4.0–10.5)
WBC: 6.1 K/uL (ref 4.0–10.5)
nRBC: 0 % (ref 0.0–0.2)
nRBC: 0 % (ref 0.0–0.2)
nRBC: 0 % (ref 0.0–0.2)

## 2024-08-02 LAB — URINALYSIS, ROUTINE W REFLEX MICROSCOPIC
Bacteria, UA: NONE SEEN
Bilirubin Urine: NEGATIVE
Glucose, UA: >=500 mg/dL — AB
Hgb urine dipstick: NEGATIVE
Ketones, ur: 80 mg/dL — AB
Leukocytes,Ua: NEGATIVE
Nitrite: NEGATIVE
Protein, ur: 30 mg/dL — AB
Specific Gravity, Urine: 1.015 (ref 1.005–1.030)
pH: 5 (ref 5.0–8.0)

## 2024-08-02 LAB — GLUCOSE, CAPILLARY
Glucose-Capillary: 136 mg/dL — ABNORMAL HIGH (ref 70–99)
Glucose-Capillary: 106 mg/dL — ABNORMAL HIGH (ref 70–99)
Glucose-Capillary: 97 mg/dL (ref 70–99)
Glucose-Capillary: 117 mg/dL — ABNORMAL HIGH (ref 70–99)
Glucose-Capillary: 125 mg/dL — ABNORMAL HIGH (ref 70–99)
Glucose-Capillary: 133 mg/dL — ABNORMAL HIGH (ref 70–99)
Glucose-Capillary: 137 mg/dL — ABNORMAL HIGH (ref 70–99)
Glucose-Capillary: 137 mg/dL — ABNORMAL HIGH (ref 70–99)
Glucose-Capillary: 153 mg/dL — ABNORMAL HIGH (ref 70–99)
Glucose-Capillary: 155 mg/dL — ABNORMAL HIGH (ref 70–99)
Glucose-Capillary: 123 mg/dL — ABNORMAL HIGH (ref 70–99)
Glucose-Capillary: 143 mg/dL — ABNORMAL HIGH (ref 70–99)
Glucose-Capillary: 128 mg/dL — ABNORMAL HIGH (ref 70–99)
Glucose-Capillary: 148 mg/dL — ABNORMAL HIGH (ref 70–99)
Glucose-Capillary: 149 mg/dL — ABNORMAL HIGH (ref 70–99)
Glucose-Capillary: 89 mg/dL (ref 70–99)

## 2024-08-02 LAB — MAGNESIUM: Magnesium: 1.8 mg/dL (ref 1.7–2.4)

## 2024-08-02 LAB — BETA-HYDROXYBUTYRIC ACID
Beta-Hydroxybutyric Acid: >8 mmol/L — ABNORMAL HIGH (ref 0.05–0.27)
Beta-Hydroxybutyric Acid: 5.73 mmol/L — ABNORMAL HIGH (ref 0.05–0.27)
Beta-Hydroxybutyric Acid: 3.39 mmol/L — ABNORMAL HIGH (ref 0.05–0.27)
Beta-Hydroxybutyric Acid: 1.82 mmol/L — ABNORMAL HIGH (ref 0.05–0.27)

## 2024-08-02 LAB — BLOOD GAS, VENOUS
Acid-base deficit: 5.8 mmol/L — ABNORMAL HIGH (ref 0.0–2.0)
Bicarbonate: 20.1 mmol/L (ref 20.0–28.0)
O2 Saturation: 31.5 %
Patient temperature: 37
pCO2, Ven: 40 mmHg — ABNORMAL LOW (ref 44–60)
pH, Ven: 7.31 (ref 7.25–7.43)
pO2, Ven: <31 mmHg — CL (ref 32–45)

## 2024-08-02 LAB — COMPREHENSIVE METABOLIC PANEL WITH GFR
ALT: 73 U/L — ABNORMAL HIGH (ref 0–44)
ALT: 61 U/L — ABNORMAL HIGH (ref 0–44)
AST: 90 U/L — ABNORMAL HIGH (ref 15–41)
AST: 68 U/L — ABNORMAL HIGH (ref 15–41)
Albumin: 4.7 g/dL (ref 3.5–5.0)
Albumin: 4.1 g/dL (ref 3.5–5.0)
Alkaline Phosphatase: 241 U/L — ABNORMAL HIGH (ref 38–126)
Alkaline Phosphatase: 205 U/L — ABNORMAL HIGH (ref 38–126)
Anion gap: 25 — ABNORMAL HIGH (ref 5–15)
Anion gap: 22 — ABNORMAL HIGH (ref 5–15)
BUN: <5 mg/dL — ABNORMAL LOW (ref 6–20)
BUN: <5 mg/dL — ABNORMAL LOW (ref 6–20)
CO2: 14 mmol/L — ABNORMAL LOW (ref 22–32)
CO2: 14 mmol/L — ABNORMAL LOW (ref 22–32)
Calcium: 9.7 mg/dL (ref 8.9–10.3)
Calcium: 9 mg/dL (ref 8.9–10.3)
Chloride: 97 mmol/L — ABNORMAL LOW (ref 98–111)
Chloride: 98 mmol/L (ref 98–111)
Creatinine, Ser: 0.79 mg/dL (ref 0.61–1.24)
Creatinine, Ser: 0.7 mg/dL (ref 0.61–1.24)
GFR, Estimated: >60 mL/min (ref >60)
GFR, Estimated: >60 mL/min (ref >60)
Glucose, Bld: 221 mg/dL — ABNORMAL HIGH (ref 70–99)
Glucose, Bld: 117 mg/dL — ABNORMAL HIGH (ref 70–99)
Potassium: 3.8 mmol/L (ref 3.5–5.1)
Potassium: 3.7 mmol/L (ref 3.5–5.1)
Sodium: 135 mmol/L (ref 135–145)
Sodium: 134 mmol/L — ABNORMAL LOW (ref 135–145)
Total Bilirubin: 0.6 mg/dL (ref 0.0–1.2)
Total Bilirubin: 0.6 mg/dL (ref 0.0–1.2)
Total Protein: 7.7 g/dL (ref 6.5–8.1)
Total Protein: 6.5 g/dL (ref 6.5–8.1)

## 2024-08-02 LAB — HEMOGLOBIN A1C
Hgb A1c MFr Bld: 9.3 % — ABNORMAL HIGH (ref 4.8–5.6)
Mean Plasma Glucose: 220.21 mg/dL

## 2024-08-02 LAB — TROPONIN T, HIGH SENSITIVITY
Troponin T High Sensitivity: <15 ng/L (ref 0–19)
Troponin T High Sensitivity: <15 ng/L (ref 0–19)

## 2024-08-02 LAB — CBG MONITORING, ED: Glucose-Capillary: 212 mg/dL — ABNORMAL HIGH (ref 70–99)

## 2024-08-02 LAB — LACTIC ACID, PLASMA
Lactic Acid, Venous: 2.4 mmol/L (ref 0.5–1.9)
Lactic Acid, Venous: 1.7 mmol/L (ref 0.5–1.9)

## 2024-08-02 LAB — ETHANOL: Alcohol, Ethyl (B): <15 mg/dL (ref <15)

## 2024-08-02 LAB — PHOSPHORUS: Phosphorus: 2.7 mg/dL (ref 2.5–4.6)

## 2024-08-02 LAB — LIPASE, BLOOD: Lipase: 12 U/L (ref 11–51)

## 2024-08-02 LAB — D-DIMER, QUANTITATIVE: D-Dimer, Quant: 0.28 ug{FEU}/mL (ref 0.00–0.50)

## 2024-08-02 LAB — MRSA NEXT GEN BY PCR, NASAL: MRSA by PCR Next Gen: NOT DETECTED

## 2024-08-02 LAB — ACETAMINOPHEN LEVEL: Acetaminophen (Tylenol), Serum: <10 ug/mL — ABNORMAL LOW (ref 10–30)

## 2024-08-02 MED ORDER — LACTATED RINGERS IV BOLUS: 1000.0000 mL | Freq: Once | INTRAVENOUS | Status: DC

## 2024-08-02 MED ORDER — POTASSIUM CHLORIDE CRYS ER 20 MEQ PO TBCR
40.0000 meq | EXTENDED_RELEASE_TABLET | Freq: Once | ORAL | Status: AC
  Administered 2024-08-02: 40 meq via ORAL
  Filled 2024-08-02: qty 2

## 2024-08-02 MED ORDER — LACTATED RINGERS IV SOLN: INTRAVENOUS | Status: DC

## 2024-08-02 MED ORDER — LACTATED RINGERS IV BOLUS
1000.0000 mL | Freq: Once | INTRAVENOUS | Status: AC
  Administered 2024-08-02: 1000 mL via INTRAVENOUS

## 2024-08-02 MED ORDER — HEPARIN SODIUM (PORCINE) 5000 UNIT/ML IJ SOLN
5000.0000 [IU] | Freq: Three times a day (TID) | INTRAMUSCULAR | Status: DC
  Administered 2024-08-02 – 2024-08-04 (×7): 5000 [IU] via SUBCUTANEOUS
  Filled 2024-08-02 (×7): qty 1

## 2024-08-02 MED ORDER — INSULIN REGULAR(HUMAN) IN NACL 100-0.9 UT/100ML-% IV SOLN
INTRAVENOUS | Status: DC
  Administered 2024-08-02: 7.5 [IU]/h via INTRAVENOUS
  Filled 2024-08-02: qty 100

## 2024-08-02 MED ORDER — GADOBUTROL 1 MMOL/ML IV SOLN
6.0000 mL | Freq: Once | INTRAVENOUS | Status: AC | PRN
  Administered 2024-08-02: 6 mL via INTRAVENOUS

## 2024-08-02 MED ORDER — ONDANSETRON HCL 4 MG/2ML IJ SOLN
4.0000 mg | Freq: Once | INTRAMUSCULAR | Status: AC
  Administered 2024-08-02: 4 mg via INTRAVENOUS
  Filled 2024-08-02: qty 2

## 2024-08-02 MED ORDER — DEXTROSE IN LACTATED RINGERS 5 % IV SOLN: INTRAVENOUS | Status: DC

## 2024-08-02 MED ORDER — INSULIN GLARGINE 100 UNIT/ML ~~LOC~~ SOLN
12.0000 [IU] | SUBCUTANEOUS | Status: DC
  Administered 2024-08-02 – 2024-08-03 (×2): 12 [IU] via SUBCUTANEOUS
  Filled 2024-08-02 (×4): qty 0.12

## 2024-08-02 MED ORDER — INSULIN GLARGINE-YFGN 100 UNIT/ML ~~LOC~~ SOLN: 12.0000 [IU] | SUBCUTANEOUS | Status: DC

## 2024-08-02 MED ORDER — CARVEDILOL 12.5 MG PO TABS
12.5000 mg | ORAL_TABLET | Freq: Two times a day (BID) | ORAL | Status: DC
Start: 2024-08-02 — End: 2024-08-04
  Administered 2024-08-02 – 2024-08-03 (×4): 12.5 mg via ORAL
  Filled 2024-08-02 (×5): qty 1

## 2024-08-02 MED ORDER — FENTANYL CITRATE PF 50 MCG/ML IJ SOSY
12.5000 ug | PREFILLED_SYRINGE | INTRAMUSCULAR | Status: DC | PRN
  Administered 2024-08-02 (×5): 50 ug via INTRAVENOUS
  Administered 2024-08-03: 25 ug via INTRAVENOUS
  Administered 2024-08-03 (×4): 50 ug via INTRAVENOUS
  Administered 2024-08-03: 25 ug via INTRAVENOUS
  Administered 2024-08-04 (×2): 50 ug via INTRAVENOUS
  Filled 2024-08-02 (×13): qty 1

## 2024-08-02 MED ORDER — PANTOPRAZOLE SODIUM 40 MG IV SOLR
40.0000 mg | Freq: Two times a day (BID) | INTRAVENOUS | Status: DC
  Administered 2024-08-02 – 2024-08-04 (×5): 40 mg via INTRAVENOUS
  Filled 2024-08-02 (×5): qty 10

## 2024-08-02 MED ORDER — HYDROMORPHONE HCL 1 MG/ML IJ SOLN
1.0000 mg | Freq: Once | INTRAMUSCULAR | Status: AC
  Administered 2024-08-02: 1 mg via INTRAVENOUS
  Filled 2024-08-02: qty 1

## 2024-08-02 MED ORDER — OXYCODONE HCL 5 MG PO TABS
5.0000 mg | ORAL_TABLET | Freq: Four times a day (QID) | ORAL | Status: DC | PRN
  Administered 2024-08-02 – 2024-08-04 (×5): 5 mg via ORAL
  Filled 2024-08-02 (×6): qty 1

## 2024-08-02 MED ORDER — ADULT MULTIVITAMIN W/MINERALS CH
1.0000 | ORAL_TABLET | Freq: Every day | ORAL | Status: DC
  Administered 2024-08-02 – 2024-08-04 (×3): 1 via ORAL
  Filled 2024-08-02 (×3): qty 1

## 2024-08-02 MED ORDER — ONDANSETRON HCL 4 MG PO TABS: 4.0000 mg | ORAL_TABLET | Freq: Four times a day (QID) | ORAL | Status: DC | PRN

## 2024-08-02 MED ORDER — PANTOPRAZOLE SODIUM 40 MG PO TBEC: 40.0000 mg | DELAYED_RELEASE_TABLET | Freq: Every day | ORAL | Status: DC

## 2024-08-02 MED ORDER — ACETAMINOPHEN 650 MG RE SUPP: 650.0000 mg | Freq: Four times a day (QID) | RECTAL | Status: DC | PRN

## 2024-08-02 MED ORDER — CHLORHEXIDINE GLUCONATE CLOTH 2 % EX PADS
6.0000 | MEDICATED_PAD | Freq: Every day | CUTANEOUS | Status: DC
  Administered 2024-08-02 – 2024-08-04 (×2): 6 via TOPICAL

## 2024-08-02 MED ORDER — DEXCOM G7 SENSOR MISC: 1.0000 | Status: DC

## 2024-08-02 MED ORDER — INSULIN ASPART 100 UNIT/ML IJ SOLN
2.0000 [IU] | Freq: Three times a day (TID) | INTRAMUSCULAR | Status: DC
  Administered 2024-08-02 – 2024-08-04 (×4): 2 [IU] via SUBCUTANEOUS

## 2024-08-02 MED ORDER — PANCRELIPASE (LIP-PROT-AMYL) 36000-114000 UNITS PO CPEP
72000.0000 [IU] | ORAL_CAPSULE | Freq: Three times a day (TID) | ORAL | Status: DC
  Administered 2024-08-02 – 2024-08-04 (×8): 72000 [IU] via ORAL
  Filled 2024-08-02 (×9): qty 2

## 2024-08-02 MED ORDER — INSULIN ASPART 100 UNIT/ML IJ SOLN
0.0000 [IU] | Freq: Three times a day (TID) | INTRAMUSCULAR | Status: DC
  Administered 2024-08-03 (×2): 2 [IU] via SUBCUTANEOUS
  Administered 2024-08-04: 1 [IU] via SUBCUTANEOUS

## 2024-08-02 MED ORDER — DEXTROSE 50 % IV SOLN: 0.0000 mL | INTRAVENOUS | Status: DC | PRN

## 2024-08-02 MED ORDER — INSULIN ASPART 100 UNIT/ML IJ SOLN
0.0000 [IU] | Freq: Every day | INTRAMUSCULAR | Status: DC
  Administered 2024-08-03: 3 [IU] via SUBCUTANEOUS

## 2024-08-02 MED ORDER — IOHEXOL 350 MG/ML SOLN
100.0000 mL | Freq: Once | INTRAVENOUS | Status: AC | PRN
  Administered 2024-08-02: 100 mL via INTRAVENOUS

## 2024-08-02 MED ORDER — ACETAMINOPHEN 325 MG PO TABS: 650.0000 mg | ORAL_TABLET | Freq: Four times a day (QID) | ORAL | Status: DC | PRN

## 2024-08-02 MED ORDER — LACTATED RINGERS IV BOLUS
2000.0000 mL | Freq: Once | INTRAVENOUS | Status: AC
Start: 2024-08-02 — End: 2024-08-02
  Administered 2024-08-02: 2000 mL via INTRAVENOUS

## 2024-08-02 MED ORDER — ONDANSETRON HCL 4 MG/2ML IJ SOLN: 4.0000 mg | Freq: Four times a day (QID) | INTRAMUSCULAR | Status: DC | PRN

## 2024-08-02 MED ORDER — OXYCODONE HCL 5 MG PO TABS
5.0000 mg | ORAL_TABLET | ORAL | Status: DC | PRN
  Administered 2024-08-02: 5 mg via ORAL
  Filled 2024-08-02: qty 1

## 2024-08-02 MED ORDER — GABAPENTIN 100 MG PO CAPS: 100.0000 mg | ORAL_CAPSULE | Freq: Two times a day (BID) | ORAL | Status: DC | PRN

## 2024-08-02 MED ORDER — ALBUTEROL SULFATE (2.5 MG/3ML) 0.083% IN NEBU: 2.5000 mg | INHALATION_SOLUTION | RESPIRATORY_TRACT | Status: DC | PRN

## 2024-08-02 MED ORDER — SUCRALFATE 1 GM/10ML PO SUSP
1.0000 g | Freq: Three times a day (TID) | ORAL | Status: DC
  Administered 2024-08-02 – 2024-08-04 (×10): 1 g via ORAL
  Filled 2024-08-02 (×10): qty 10

## 2024-08-02 NOTE — Plan of Care (Signed)
   Problem: Clinical Measurements: Goal: Respiratory complications will improve Outcome: Progressing   Problem: Clinical Measurements: Goal: Cardiovascular complication will be avoided Outcome: Progressing

## 2024-08-02 NOTE — Progress Notes (Signed)
 Progress Note   Patient: Carlos [name] FMW:[id number] DOB: 10/13/1986 DOA: 08/01/2024     0 DOS: the patient was seen and examined on 08/02/2024   Brief hospital course: 38yo with h/o anxiety, T1DM, HTN, diabetic gastroparesis, and pancreatitis who presented on 9/24 with abdominal pain and was found to be in DKA.  He has chronic pain and ran out of chronic pain medications, unable to get into pain clinic.  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  Assessment and Plan:  Type 1 DM with DKA Admitted to stepdown unit Uncertain etiology - denies medication changes or  noncompliance, dehydration, recent infection Continue on insulin  drip per dka protocol  On most recent labs, gap is closed, acidosis resolved Will order transition order set   Progressive RUQ/epigastric pain  Similar symptoms of prior pancreatic flare  CT A/P without apparent acute issues MRCP without acute pancreatitis Supportive pain medications  Supportive ivfs  PPI and carafate  trial    Elevated LFTS with cholestatic picture Alt 73, AST 90 , Alphos 241  Known history of prior biliary stents Possible obstruction as cause of pain  MRCP ordered and showed 2 x 2 cm cystic lesion in pancreatic tail - pseduocyst vs. 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    Chronic pain  Awaiting pain clinic referral  Previously on PO Dilaudid , currently on oxy He is receiving 60 pills per month by his PCP but reports  that this is only a 10 day supply and he is having to space them out to last all month We discussed that opiates are not recommended as chronic pancreatitis pain management Will continue to consider potential causes of pain and other options for treatment      Consultants: GI (telephone only) DM coordinator  Procedures: None  Antibiotics: None  30 Day Unplanned Readmission Risk Score    Flowsheet Row ED to Hosp-Admission (Current) from 08/01/2024 in [facility]-ICU/STEPDOWN  30 Day Unplanned Readmission Risk Score (%) 16.23 Filed at 08/02/2024 0400    This score is the patient's risk of an unplanned readmission within 30 days of being discharged (0 -100%). The score is based on dignosis, age, lab data, medications, orders, and past utilization.   Low:  0-14.9   Medium: 15-21.9   High: 22-29.9   Extreme: 30 and above           Subjective: patient is continuing to have RUQ pain, no improvement.   Objective: Vitals:   08/02/24 1200 08/02/24 1300  BP: (!) 89/71   Pulse: 77 62  Resp: 15 11  Temp: 97.8 F (36.6 C)   SpO2: 100% 100%    Intake/Output Summary (Last 24 hours) at 08/02/2024 1552 Last data filed at 08/02/2024 1551 Gross per 24 hour  Intake 3418.02 ml  Output --  Net 3418.02 ml   Filed Weights   08/02/24 0400  Weight: 61.7 kg    Exam:  General:  Appears calm and comfortable and is in NAD Eyes:  normal lids,  iris ENT:  grossly normal hearing, lips & tongue, mmm Cardiovascular:  RRR. No LE edema.  Respiratory:   CTA bilaterally with no wheezes/rales/rhonchi.  Normal respiratory effort. Abdomen:  soft, RUQ TTP, ND Skin:  no rash or induration seen on limited exam Musculoskeletal:  grossly normal tone BUE/BLE, good ROM, no bony abnormality Psychiatric:  grossly normal mood and affect, speech fluent and appropriate, AOx3 Neurologic:  CN 2-12 grossly intact, moves all extremities in coordinated fashion  Data Reviewed: I have  reviewed the patient's lab results since admission.  Pertinent labs for today include:   VBG: 7.31/40/20.1 CO2 14 Glucose 117 Anion gap 22 AP 204 AST 68/ALT 61 WBC 6.1 Hgb 11.4 Platelets 135 BHA 5.73     Family Communication: None present      Code Status: Full Code   Disposition: Status is: Inpatient Remains inpatient appropriate because: ongoing monitoring     Time spent: 50 minutes  Unresulted Labs (From admission, onward)     Start     Ordered   08/03/24 0500  Basic metabolic panel  Daily,   R      08/02/24 1551             Author: Delon Herald, MD 08/02/2024 3:52 PM  For on call review [email or web address].

## 2024-08-02 NOTE — ED Provider Notes (Signed)
 [place] EMERGENCY DEPARTMENT AT [facility] Provider Note   CSN: [id number] Arrival date & time: 08/01/24  2329     Patient presents with: Chest Pain   Carlos [name] is a 38 y.o. male.   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  Reports the pain is constant but progressively worsening.  Not controlled with his home oxycodone .  Nausea but no vomiting.  States the pain is to his right ribs and his right upper abdomen and feels like his usual pancreas pain that is more persistent instead of waxing and waning in severity.  No fever.  Reports his sugars have been well-controlled.  No pain with urination or blood in the urine.  Still has appendix and gallbladder.  Has required pancreatic stents in the past.  The history is provided by the patient.  Chest Pain Associated symptoms: abdominal pain, nausea and vomiting   Associated symptoms: no back pain, no dizziness, no fever, no headache, no shortness of breath and no weakness        Prior to Admission medications   Medication Sig Start Date End Date Taking? Authorizing Provider  carvedilol  (COREG ) 12.5 MG tablet Take 1 tablet (12.5 mg total) by mouth 2 (two) times daily with a meal. 03/29/24   Drusilla, Sabas RAMAN, MD  Continuous Glucose Receiver (DEXCOM G7 RECEIVER) DEVI Use as directed. 04/05/24     Continuous Glucose Sensor (DEXCOM G7 SENSOR) MISC 1 Device by Does not apply route as directed. Patient not taking: Reported on 02/16/2024 09/19/23   [name], Donell Cardinal, MD  Continuous Glucose Sensor (DEXCOM G7 SENSOR) MISC Use as directed and change every 10 days. 04/05/24     gabapentin  (NEURONTIN ) 100 MG capsule Take 1 capsule (100 mg total) by mouth 2 (two) times daily. 02/16/24   Leigh Venetia CROME, MD  insulin  aspart (NOVOLOG  FLEXPEN) 100 UNIT/ML FlexPen Max daily 30 units 09/19/23   [name], Ibtehal Jaralla,  MD  insulin  glargine (LANTUS  SOLOSTAR) 100 UNIT/ML Solostar Pen Inject 16 Units into the skin daily. 09/19/23   [name], Ibtehal Jaralla, MD  Insulin  Pen Needle 32G X 4 MM MISC 1 Device by Does not apply route in the morning, at noon, in the evening, and at bedtime. 09/19/23   [name], Ibtehal Jaralla, MD  lipase/protease/amylase (CREON ) 36000 UNITS CPEP capsule Take 1-2 capsules (36,000-72,000 Units total) by mouth See admin instructions. Take 72,000 units by mouth three times a day before meals and 36,000 units with any snacks 01/25/24   [name], Gabriel Jr., MD  multivitamin (ONE-A-DAY MEN'S) TABS tablet Take 1 tablet by mouth daily with breakfast. 01/12/24   [name], Dinah C, NP  naloxone  (NARCAN ) nasal spray 4 mg/0.1 mL Place 1 spray into the nose once as needed (Overdose). 04/05/23   [provider]  omeprazole  (PRILOSEC) 40 MG capsule TAKE 1 CAPSULE (40 MG TOTAL) BY MOUTH DAILY. 10/31/23   [name], Dinah C, NP  oxyCODONE  (OXY IR/ROXICODONE ) 5 MG immediate release tablet Take 1 tablet (5 mg total) by mouth every 4 (four) hours as needed for severe pain (pain score 7-10). 07/23/24   [name], Dinah C, NP  potassium citrate  (UROCIT-K ) 10 MEQ (1080 MG) SR tablet Take 1 tablet (10 mEq total) by mouth 2 (two) times daily after a meal. 04/05/24       Allergies: Other    Review of Systems  Constitutional:  Positive for activity change  and appetite change. Negative for fever.  HENT:  Negative for congestion and rhinorrhea.   Respiratory:  Positive for chest tightness. Negative for shortness of breath.   Cardiovascular:  Positive for chest pain.  Gastrointestinal:  Positive for abdominal pain, nausea and vomiting.  Genitourinary:  Negative for dysuria and hematuria.  Musculoskeletal:  Negative for back pain.  Skin:  Negative for wound.  Neurological:  Negative for dizziness, weakness and headaches.   all other systems are negative except as noted in the HPI and PMH.    Updated Vital  Signs BP (!) 152/112 (BP Location: Right Arm)   Pulse (!) 119   Temp 98.3 F (36.8 C) (Oral)   Resp 20   SpO2 100%   Physical Exam Vitals and nursing note reviewed.  Constitutional:      General: He is not in acute distress.    Appearance: He is well-developed.     Comments: Uncomfortable, hunched over the bed  HENT:     Head: Normocephalic and atraumatic.     Mouth/Throat:     Pharynx: No oropharyngeal exudate.  Eyes:     Conjunctiva/sclera: Conjunctivae normal.     Pupils: Pupils are equal, round, and reactive to light.  Neck:     Comments: No meningismus. Cardiovascular:     Rate and Rhythm: Regular rhythm. Tachycardia present.     Heart sounds: Normal heart sounds. No murmur heard. Pulmonary:     Effort: Pulmonary effort is normal. No respiratory distress.     Breath sounds: Normal breath sounds.     Comments: Right lateral chest wall tenderness and right upper abdominal tenderness Chest:     Chest wall: Tenderness present.  Abdominal:     Palpations: Abdomen is soft.     Tenderness: There is abdominal tenderness. There is guarding. There is no rebound.     Comments: Epigastric and right upper quadrant tenderness with voluntary guarding  Musculoskeletal:        General: No tenderness. Normal range of motion.     Cervical back: Normal range of motion and neck supple.  Skin:    General: Skin is warm.  Neurological:     Mental Status: He is alert and oriented to person, place, and time.     Cranial Nerves: No cranial nerve deficit.     Motor: No abnormal muscle tone.     Coordination: Coordination normal.     Comments: No ataxia on finger to nose bilaterally. No pronator drift. 5/5 strength throughout. CN 2-12 intact.Equal grip strength. Sensation intact.   Psychiatric:        Behavior: Behavior normal.     (all labs ordered are listed, but only abnormal results are displayed) Labs Reviewed  CBC - Abnormal; Notable for the following components:      Result  Value   RBC 3.65 (*)    MCV 107.1 (*)    MCH 36.4 (*)    All other components within normal limits  COMPREHENSIVE METABOLIC PANEL WITH GFR - Abnormal; Notable for the following components:   Chloride 97 (*)    CO2 14 (*)    Glucose, Bld 221 (*)    BUN <5 (*)    AST 90 (*)    ALT 73 (*)    Alkaline Phosphatase 241 (*)    Anion gap 25 (*)    All other components within normal limits  BLOOD GAS, VENOUS - Abnormal; Notable for the following components:   pCO2, Ven 40 (*)  pO2, Ven <31 (*)    Acid-base deficit 5.8 (*)    All other components within normal limits  BETA-HYDROXYBUTYRIC ACID - Abnormal; Notable for the following components:   Beta-Hydroxybutyric Acid >8.00 (*)    All other components within normal limits  LACTIC ACID, PLASMA - Abnormal; Notable for the following components:   Lactic Acid, Venous 2.4 (*)    All other components within normal limits  CBC - Abnormal; Notable for the following components:   RBC 3.53 (*)    Hemoglobin 12.2 (*)    HCT 37.7 (*)    MCV 106.8 (*)    MCH 34.6 (*)    All other components within normal limits  BASIC METABOLIC PANEL WITH GFR - Abnormal; Notable for the following components:   Chloride 94 (*)    CO2 13 (*)    Glucose, Bld 196 (*)    BUN <5 (*)    Anion gap 28 (*)    All other components within normal limits  CBC - Abnormal; Notable for the following components:   RBC 3.29 (*)    Hemoglobin 11.4 (*)    HCT 35.6 (*)    MCV 108.2 (*)    MCH 34.7 (*)    Platelets 135 (*)    All other components within normal limits  COMPREHENSIVE METABOLIC PANEL WITH GFR - Abnormal; Notable for the following components:   Sodium 134 (*)    CO2 14 (*)    Glucose, Bld 117 (*)    BUN <5 (*)    AST 68 (*)    ALT 61 (*)    Alkaline Phosphatase 205 (*)    Anion gap 22 (*)    All other components within normal limits  GLUCOSE, CAPILLARY - Abnormal; Notable for the following components:   Glucose-Capillary 136 (*)    All other components  within normal limits  ACETAMINOPHEN  LEVEL - Abnormal; Notable for the following components:   Acetaminophen  (Tylenol ), Serum <10 (*)    All other components within normal limits  GLUCOSE, CAPILLARY - Abnormal; Notable for the following components:   Glucose-Capillary 106 (*)    All other components within normal limits  CBG MONITORING, ED - Abnormal; Notable for the following components:   Glucose-Capillary 212 (*)    All other components within normal limits  MRSA NEXT GEN BY PCR, NASAL  LIPASE, BLOOD  LACTIC ACID, PLASMA  D-DIMER, QUANTITATIVE  MAGNESIUM   PHOSPHORUS  ETHANOL  GLUCOSE, CAPILLARY  URINALYSIS, ROUTINE W REFLEX MICROSCOPIC  BETA-HYDROXYBUTYRIC ACID  BETA-HYDROXYBUTYRIC ACID  BETA-HYDROXYBUTYRIC ACID  BETA-HYDROXYBUTYRIC ACID  BASIC METABOLIC PANEL WITH GFR  BASIC METABOLIC PANEL WITH GFR  HEMOGLOBIN A1C  TROPONIN T, HIGH SENSITIVITY  TROPONIN T, HIGH SENSITIVITY    EKG: EKG Interpretation Date/Time:  Wednesday August 01 2024 23:39:17 EDT Ventricular Rate:  115 PR Interval:  118 QRS Duration:  81 QT Interval:  325 QTC Calculation: 450 R Axis:   82  Text Interpretation: Sinus tachycardia Right atrial enlargement Minimal ST depression, inferior leads ST elevations improved Rate faster Confirmed by Carita Senior [phone]) on 08/01/2024 11:57:54 PM  Radiology: CT ABDOMEN PELVIS W CONTRAST Result Date: 08/02/2024 EXAM: CTA CHEST PE WITH CONTRAST CT ABDOMEN AND PELVIS WITH CONTRAST 08/02/2024 03:44:27 AM TECHNIQUE: CTA of the chest was performed after the administration of 100 mL of iohexol  (OMNIPAQUE ) 350 MG/ML injection. Multiplanar reformatted images are provided for review. MIP images are provided for review. CT of the abdomen and pelvis was performed with the administration of  intravenous contrast. Automated exposure control, iterative reconstruction, and/or weight based adjustment of the mA/kV was utilized to reduce the radiation dose to as low as  reasonably achievable. COMPARISON: CT abdomen / pelvis dated 03/28/2024. CLINICAL HISTORY: Pancreatitis suspected. Chest pain x3 weeks. Reports worse today. Reports pain in chest is similar to a pancreatitis flare he had previously, wbc's 5.3, GFR>60; 100 ml omni 350. FINDINGS: CHEST: PULMONARY ARTERIES: Pulmonary arteries are adequately opacified for evaluation. No intraluminal filling defect to suggest pulmonary embolism. Main pulmonary artery is normal in caliber. MEDIASTINUM: No mediastinal lymphadenopathy. The heart and pericardium demonstrate no acute abnormality. There is no acute abnormality of the thoracic aorta. LUNGS AND PLEURA: The lungs are without acute process. No focal consolidation or pulmonary edema. No pleural effusion or pneumothorax. SOFT TISSUES AND BONES: No acute bone or soft tissue abnormality. ABDOMEN AND PELVIS: LIVER: The liver is unremarkable. GALLBLADDER AND BILE DUCTS: Gallbladder is unremarkable. No biliary ductal dilatation. SPLEEN: Spleen demonstrates no acute abnormality. PANCREAS: Coarse parenchymal calcifications with atrophy along the pancreas, reflecting sequela of prior / chronic pancreatitis. 2.1 cm pseudocyst along the pancreatic tail (image 18), similar. ADRENAL GLANDS: Adrenal glands demonstrate no acute abnormality. KIDNEYS, URETERS AND BLADDER: No stones in the kidneys or ureters. No hydronephrosis. No perinephric or periureteral stranding. Urinary bladder is moderately distended. GI AND BOWEL: Mild wall thickening along the proximal duodenum (image 29), correlate for duodenitis. There is no bowel obstruction. No abnormal bowel wall thickening or distension. REPRODUCTIVE: Reproductive organs are unremarkable. PERITONEUM AND RETROPERITONEUM: No ascites or free air. LYMPH NODES: No lymphadenopathy. BONES AND SOFT TISSUES: No acute abnormality of the visualized bones. No focal soft tissue abnormality. 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  IMPRESSION: 1. No evidence of pulmonary embolism. Negative CT chest. 2. Chronic pancreatitis with 2.1 cm pancreatic tail pseudocyst, similar. No acute inflammatory changes. 3. Mild proximal duodenal wall thickening, suggestive of duodenitis. 4. 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 Electronically signed by: Pinkie Pebbles MD 08/02/2024 03:51 AM EDT RP Workstation: [id number]   CT Angio Chest PE W and/or Wo Contrast Result Date: 08/02/2024 EXAM: CTA CHEST PE WITH CONTRAST CT ABDOMEN AND PELVIS WITH CONTRAST 08/02/2024 03:44:27 AM TECHNIQUE: CTA of the chest was performed after the administration of 100 mL of iohexol  (OMNIPAQUE ) 350 MG/ML injection. Multiplanar reformatted images are provided for review. MIP images are provided for review. CT of the abdomen and pelvis was performed with the administration of intravenous contrast. Automated exposure control, iterative reconstruction, and/or weight based adjustment of the mA/kV was utilized to reduce the radiation dose to as low as reasonably achievable. COMPARISON: CT abdomen / pelvis dated 03/28/2024. CLINICAL HISTORY: Pancreatitis suspected. Chest pain x3 weeks. Reports worse today. Reports pain in chest is similar to a pancreatitis flare he had previously, wbc's 5.3, GFR>60; 100 ml omni 350. FINDINGS: CHEST: PULMONARY ARTERIES: Pulmonary arteries are adequately opacified for evaluation. No intraluminal filling defect to suggest pulmonary embolism. Main pulmonary artery is normal in caliber. MEDIASTINUM: No mediastinal lymphadenopathy. The heart and pericardium demonstrate no acute abnormality. There is no acute abnormality of the thoracic aorta. LUNGS AND PLEURA: The lungs are without acute process. No focal consolidation or pulmonary edema. No pleural effusion or pneumothorax. SOFT TISSUES AND BONES: No acute bone or soft tissue abnormality. ABDOMEN AND PELVIS: LIVER: The liver is unremarkable. GALLBLADDER AND BILE DUCTS: Gallbladder is  unremarkable. No biliary ductal dilatation. SPLEEN: Spleen demonstrates no acute abnormality. PANCREAS: Coarse parenchymal calcifications with atrophy along the pancreas, reflecting  sequela of prior / chronic pancreatitis. 2.1 cm pseudocyst along the pancreatic tail (image 18), similar. ADRENAL GLANDS: Adrenal glands demonstrate no acute abnormality. KIDNEYS, URETERS AND BLADDER: No stones in the kidneys or ureters. No hydronephrosis. No perinephric or periureteral stranding. Urinary bladder is moderately distended. GI AND BOWEL: Mild wall thickening along the proximal duodenum (image 29), correlate for duodenitis. There is no bowel obstruction. No abnormal bowel wall thickening or distension. REPRODUCTIVE: Reproductive organs are unremarkable. PERITONEUM AND RETROPERITONEUM: No ascites or free air. LYMPH NODES: No lymphadenopathy. BONES AND SOFT TISSUES: No acute abnormality of the visualized bones. No focal soft tissue abnormality. 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 IMPRESSION: 1. No evidence of pulmonary embolism. Negative CT chest. 2. Chronic pancreatitis with 2.1 cm pancreatic tail pseudocyst, similar. No acute inflammatory changes. 3. Mild proximal duodenal wall thickening, suggestive of duodenitis. 4. 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 Electronically signed by: Pinkie Pebbles MD 08/02/2024 03:51 AM EDT RP Workstation: [id number]   DG Chest 2 View Result Date: 08/01/2024 EXAM: 2 VIEW(S) XRAY OF THE CHEST 08/01/2024 11:54:22 PM COMPARISON: 03/27/2024 CLINICAL HISTORY: CP. Pancreatitis, upper abdominal and chest pain FINDINGS: LUNGS AND PLEURA: No focal pulmonary opacity. No pulmonary edema. No pleural effusion. No pneumothorax. HEART AND MEDIASTINUM: No acute abnormality of the cardiac and mediastinal silhouettes. BONES AND SOFT TISSUES: No acute osseous abnormality. IMPRESSION: 1. No acute abnormalities. Electronically signed by: Pinkie Pebbles MD 08/01/2024 11:57 PM EDT RP Workstation: [id number]     .Critical Care  Performed by: Carita Senior, MD Authorized by: Carita Senior, MD   Critical care provider statement:    Critical care time (minutes):  45   Critical care time was exclusive of:  Separately billable procedures and treating other patients   Critical care was necessary to treat or prevent imminent or life-threatening deterioration of the following conditions:  Endocrine crisis and dehydration   Critical care was time spent personally by me on the following activities:  Development of treatment plan with patient or surrogate, discussions with consultants, evaluation of patient's response to treatment, examination of patient, ordering and review of laboratory studies, ordering and review of radiographic studies, ordering and performing treatments and interventions, pulse oximetry, re-evaluation of patient's condition, review of old charts, blood draw for specimens and obtaining history from patient or surrogate   I assumed direction of critical care for this patient from another provider in my specialty: no     Care discussed with: admitting provider      Medications Ordered in the ED  lactated ringers  bolus 1,000 mL (has no administration in time range)  lactated ringers  bolus 1,000 mL (has no administration in time range)  HYDROmorphone  (DILAUDID ) injection 1 mg (has no administration in time range)  ondansetron  (ZOFRAN ) injection 4 mg (has no administration in time range)  lactated ringers  bolus 1,000 mL (has no administration in time range)  lactated ringers  bolus 1,000 mL (has no administration in time range)                                    Medical Decision Making Amount and/or Complexity of Data Reviewed Labs: ordered. Decision-making details documented in ED Course. Radiology: ordered and independent interpretation performed. Decision-making details documented in ED Course. ECG/medicine  tests: ordered and independent interpretation performed. Decision-making details documented in ED Course.  Risk Prescription drug management. Decision regarding hospitalization.   3 weeks of upper  abdominal pain and chest pain with history of pancreatitis and diabetes.  Tachycardic and uncomfortable on arrival.  Abdomen soft without peritoneal signs.  Will hydrate, check labs.  EKG sinus tachycardia without acute ST changes. What may be imaging are negative centimeter. 6.  Head and head is read wants to give him fucking like I am Patient given IV fluids.  EKG is sinus tachycardia without acute ST changes.  Labs consistent with DKA with elevated anion gap, metabolic acidosis.  Will initiate IV insulin  and IV fluids per protocol.  Lipase is within normal limits.  The LFTs show slight elevation of transaminases.  Presentation consistent with DKA.  Given IV fluids and IV insulin . Chest x-ray is negative.  Slight LFT elevation.  Lipase however is normal.  Given ongoing pain in his upper abdomen will obtain imaging given LFT elevations.  Suspect likely secondary to pancreatitis. CT negative for pneumonia or  pulmonary embolism.  CT shows evidence of chronic pancreatitis and duodenitis.  Discussed admission with Dr. Debby.  Heart rate improving. Anion gap acidosis, elevated beta hydroxybutyrate and lactic acid. Continue IVF and insulin .      Final diagnoses:  Diabetic ketoacidosis without coma associated with type 2 diabetes mellitus (HCC)  Acute pancreatitis without infection or necrosis, unspecified pancreatitis type    ED Discharge Orders     None          Perpetua Elling, Garnette, MD 08/02/24 [phone]

## 2024-08-02 NOTE — Inpatient Diabetes Management (Addendum)
 Inpatient Diabetes Program Recommendations  AACE/ADA: New Consensus Statement on Inpatient Glycemic Control   Target Ranges:  Prepandial:   less than 140 mg/dL      Peak postprandial:   less than 180 mg/dL (1-2 hours)      Critically ill patients:  140 - 180 mg/dL    Latest Reference Range & Units 08/02/24 03:05 08/02/24 04:26 08/02/24 08:47  Beta-Hydroxybutyric Acid 0.05 - 0.27 mmol/L >8.00 (H) 5.73 (H) 3.39 (H)    Latest Reference Range & Units 08/02/24 08:47  Sodium 135 - 145 mmol/L 134 (L)  Potassium 3.5 - 5.1 mmol/L 3.4 (L)  Chloride 98 - 111 mmol/L 103  CO2 22 - 32 mmol/L 17 (L)  Glucose 70 - 99 mg/dL 872 (H)  BUN 6 - 20 mg/dL <5 (L)  Creatinine 9.38 - 1.24 mg/dL 9.34  Calcium  8.9 - 10.3 mg/dL 8.7 (L)  Anion gap 5 - 15  14    Latest Reference Range & Units 08/02/24 03:05  Hemoglobin A1C 4.8 - 5.6 % 9.3 (H)    Latest Reference Range & Units 08/02/24 06:01 08/02/24 06:55 08/02/24 07:52 08/02/24 08:56 08/02/24 09:56 08/02/24 11:04  Glucose-Capillary 70 - 99 mg/dL 97 882 (H) 874 (H) 866 (H) 137 (H) 137 (H)   Review of Glycemic Control  Diabetes history: DM1  Outpatient Diabetes medications: Lantus  16 units daily, Novolog  5 units TID with meals and Novolog  correction (BG -130/50)  Patient sees Dr Sam as his Endocrinologist.   Current orders for Inpatient glycemic control: IV insulin    Inpatient Diabetes Program Recommendations: Continue IV insulin  at this time.   Note: Once patient is appropriate to transition off IV insulin , please consider the following:  - Lantus  12 units daily (please give first dose 2 hours prior to discontinuing IV insulin .  - Novlog 3 units TID with meals  - Novolog  0-6 units TID with meals and Novolog  0-5 units at bedtime.   Spoke with patient at bedside. Patient states he has Omnipod insulin  pumps and Omnipod reader at home(mobile device is not compatible with Omnipod app); however, he is unsure how to place settings into his Omnipod reader  device so he chose to continue insulin  injections. I encouraged patient to take his Omniopod Reader and pump to his next appt with Dr Sam to set this up. Paitnet denies any issues with obtaining any of his diabetic supplies. He currently uses Dexcom G7 CGM. I also encouraged patient to discuss Sick Day Plan with Dr Sam to help prevent further hospital admissions and prevent DKA.  Discussed recent A1C of 9.3%. Patient verbalized understanding.   Thanks,  Lavanda Search, RN, MSN, [facility]  Inpatient Diabetes Coordinator  Pager [phone] (8a-5p)

## 2024-08-02 NOTE — H&P (Addendum)
 History and Physical    Carlos [name] FMW:[id number] DOB: 11-29-1985 DOA: 08/01/2024  PCP: [name], NP  Patient coming from: home  I have personally briefly reviewed patient's old medical records in [facility]  Chief Complaint: right side chest pain and ruq pain x intermittent x 3 weeks worse today  HPI: Carlos [name] is a 38 y.o. male with medical history significant of  Anxiety , DM type 1, Splenic vein thrombosis, Hx of  chronic Pancreatitis ,gastroparesis, Hypertension who presents to ED with 3 weeks of right sided chest pain and RUQ pain similar to his previous bouts of acute pancreatitis.  Patient notes he ran out of his pain medications prescribed by pcp and has not been able to get into pain clinic as of yet. He notes due to this he has also been taking more nsaids. He denies any blood in his stools.   ED Course:  IN ED  Vitals: Afeb bp 152/11, hr 119, rr 20 , sat 100% EKG: sinus tachycardia , RAE,  ST changes no sig change from prior Wbc 5.3, hg 13.3, plt 175 Na 135, K 3.8, CL 97, bicarb 14 cr 0.79,  ast 90 alt 73, alphos 241, AG 25 Lipase 12 Cxr NAD Review of Systems: As per HPI otherwise 10 point review of systems negative.   Past Medical History:  Diagnosis Date   AKI (acute kidney injury) 12/21/2021   Anxiety    Diabetes (HCC)    Pancreatitis    Splenic vein thrombosis     Past Surgical History:  Procedure Laterality Date   BILIARY BRUSHING  02/02/2023   Procedure: BILIARY BRUSHING;  Surgeon: Wilhelmenia Aloha Raddle., MD;  Location: [facility];  Service: Gastroenterology;;   BILIARY BRUSHING  04/28/2023   Procedure: BILIARY BRUSHING;  Surgeon: Aneita Gwendlyn DASEN, MD;  Location: [facility];  Service: Gastroenterology;;   BILIARY BRUSHING  06/02/2023   Procedure: BILIARY BRUSHING;  Surgeon: Wilhelmenia Aloha Raddle., MD;  Location: [facility];  Service: Gastroenterology;;   BILIARY BRUSHING  01/05/2024   Procedure: BILIARY BRUSHING;  Surgeon: Wilhelmenia Aloha Raddle., MD;  Location: [facility];  Service: Gastroenterology;;   BILIARY DILATION  12/01/2022   Procedure: BILIARY DILATION;  Surgeon: Wilhelmenia Aloha Raddle., MD;  Location: [facility];  Service: Gastroenterology;;   BILIARY DILATION  02/02/2023   Procedure: BILIARY DILATION;  Surgeon: Wilhelmenia Aloha Raddle., MD;  Location: [facility];  Service: Gastroenterology;;   BILIARY DILATION  01/05/2024   Procedure: BILIARY DILATION;  Surgeon: Wilhelmenia Aloha Raddle., MD;  Location: [facility];  Service: Gastroenterology;;   BILIARY STENT PLACEMENT  04/28/2023   Procedure: BILIARY STENT PLACEMENT;  Surgeon: Aneita Gwendlyn DASEN, MD;  Location: [facility] END[facility]COPY;  Service: Gastroenterology;;   BILIARY STENT PLACEMENT N/A 06/02/2023   Procedure: BILIARY STENT PLACEMENT;  Surgeon: Wilhelmenia Aloha Raddle., MD;  Location: [facility]OSCOPY;  Service: Gastroenterology;  Laterality: N/A;   BIOPSY  12/02/2021   Procedure: BIOPSY;  Surgeon: Wilhelmenia Aloha Raddle., MD;  Location: [facility];  Service: Gastroenterology;;   ENDOSCOPIC RETROGRADE CHOLANGIOPANCREATOGRAPHY (ERCP) WITH PROPOFOL  N/A 08/12/2022   Procedure: ENDOSCOPIC RETROGRADE CHOLANGIOPANCREATOGRAPHY (ERCP) WITH PROPOFOL ;  Surgeon: Wilhelmenia Aloha Raddle., MD;  Location: [facility];  Service: Gastroenterology;  Laterality: N/A;   ENDOSCOPIC RETROGRADE CHOLANGIOPANCREATOGRAPHY (ERCP) WITH PROPOFOL  N/A 12/01/2022   Procedure: ENDOSCOPIC RETROGRADE CHOLANGIOPANCREATOGRAPHY (ERCP) WITH PROPOFOL ;  Surgeon: Wilhelmenia Aloha Raddle., MD;  Location: [facility];  Service: Gastroenterology;  Laterality: N/A;   ENDOSCOPIC RETROGRADE CHOLANGIOPANCREATOGRAPHY (ERCP) WITH PROPOFOL  N/A 02/02/2023   Procedure:  ENDOSCOPIC RETROGRADE CHOLANGIOPANCREATOGRAPHY (ERCP) WITH PROPOFOL ;  Surgeon: [name], Aloha Raddle., MD;  Location: [facility];  Service: Gastroenterology;  Laterality: N/A;   ENDOSCOPIC RETROGRADE CHOLANGIOPANCREATOGRAPHY (ERCP) WITH PROPOFOL  N/A  06/02/2023   Procedure: ENDOSCOPIC RETROGRADE CHOLANGIOPANCREATOGRAPHY (ERCP) WITH PROPOFOL ;  Surgeon: Wilhelmenia Aloha Raddle., MD;  Location: [facility];  Service: Gastroenterology;  Laterality: N/A;   ENDOSCOPIC RETROGRADE CHOLANGIOPANCREATOGRAPHY (ERCP) WITH PROPOFOL  N/A 01/05/2024   Procedure: ENDOSCOPIC RETROGRADE CHOLANGIOPANCREATOGRAPHY (ERCP) WITH PROPOFOL ;  Surgeon: Wilhelmenia Aloha Raddle., MD;  Location: [facility];  Service: Gastroenterology;  Laterality: N/A;   ERCP N/A 04/28/2023   Procedure: ENDOSCOPIC RETROGRADE CHOLANGIOPANCREATOGRAPHY (ERCP);  Surgeon: Aneita Gwendlyn DASEN, MD;  Location: [facility];  Service: Gastroenterology;  Laterality: N/A;   ESOPHAGOGASTRODUODENOSCOPY N/A 12/01/2022   Procedure: ESOPHAGOGASTRODUODENOSCOPY (EGD);  Surgeon: Wilhelmenia Aloha Raddle., MD;  Location: [facility];  Service: Gastroenterology;  Laterality: N/A;   ESOPHAGOGASTRODUODENOSCOPY (EGD) WITH PROPOFOL  N/A 12/02/2021   Procedure: ESOPHAGOGASTRODUODENOSCOPY (EGD) WITH PROPOFOL ;  Surgeon: Wilhelmenia Aloha Raddle., MD;  Location: [facility];  Service: Gastroenterology;  Laterality: N/A;   ESOPHAGOGASTRODUODENOSCOPY (EGD) WITH PROPOFOL  N/A 07/09/2023   Procedure: ESOPHAGOGASTRODUODENOSCOPY (EGD) WITH PROPOFOL ;  Surgeon: Abran Norleen SAILOR, MD;  Location: [facility];  Service: Gastroenterology;  Laterality: N/A;  needs ercp scope for stent pull   FINE NEEDLE ASPIRATION N/A 12/01/2022   Procedure: FINE NEEDLE ASPIRATION (FNA) LINEAR;  Surgeon: Wilhelmenia Aloha Raddle., MD;  Location: [facility];  Service: Gastroenterology;  Laterality: N/A;   NEUROLYTIC CELIAC PLEXUS  12/01/2022   Procedure: CELIAC PLEXUS BLOCK;  Surgeon: Wilhelmenia Aloha Raddle., MD;  Location: [facility];  Service: Gastroenterology;;   PANCREATIC STENT PLACEMENT  08/12/2022   Procedure: PANCREATIC STENT PLACEMENT;  Surgeon: Wilhelmenia Aloha Raddle., MD;  Location: [facility];  Service: Gastroenterology;;   PANCREATIC STENT  PLACEMENT  12/01/2022   Procedure: PANCREATIC STENT PLACEMENT;  Surgeon: Wilhelmenia Aloha Raddle., MD;  Location: [facility];  Service: Gastroenterology;;   PANCREATIC STENT PLACEMENT  02/02/2023   Procedure: PANCREATIC STENT PLACEMENT;  Surgeon: Wilhelmenia Aloha Raddle., MD;  Location: [facility];  Service: Gastroenterology;;   PANCREATIC STENT PLACEMENT  06/02/2023   Procedure: PANCREATIC STENT PLACEMENT;  Surgeon: Wilhelmenia Aloha Raddle., MD;  Location: [facility];  Service: Gastroenterology;;   REMOVAL OF STONES  12/01/2022   Procedure: REMOVAL OF STONES;  Surgeon: Wilhelmenia Aloha Raddle., MD;  Location: [facility];  Service: Gastroenterology;;   REMOVAL OF STONES  02/02/2023   Procedure: REMOVAL OF STONES;  Surgeon: Wilhelmenia Aloha Raddle., MD;  Location: [facility];  Service: Gastroenterology;;   REMOVAL OF STONES  06/02/2023   Procedure: REMOVAL OF STONES;  Surgeon: Wilhelmenia Aloha Raddle., MD;  Location: [facility];  Service: Gastroenterology;;   REMOVAL OF STONES  01/05/2024   Procedure: REMOVAL OF SLUDGE;  Surgeon: Wilhelmenia Aloha Raddle., MD;  Location: [facility];  Service: Gastroenterology;;   ANNETT  08/12/2022   Procedure: ANNETT;  Surgeon: Wilhelmenia Aloha Raddle., MD;  Location: [facility];  Service: Gastroenterology;;   ANNETT  12/01/2022   Procedure: ANNETT;  Surgeon: Wilhelmenia Aloha Raddle., MD;  Location: [facility];  Service: Gastroenterology;;   ANNETT  06/02/2023   Procedure: ANNETT;  Surgeon: Wilhelmenia Aloha Raddle., MD;  Location: [facility];  Service: Gastroenterology;;   CLEDA REMOVAL  12/01/2022   Procedure: STENT REMOVAL;  Surgeon: Wilhelmenia Aloha Raddle., MD;  Location: [facility];  Service: Gastroenterology;;   CLEDA REMOVAL  02/02/2023   Procedure: STENT REMOVAL;  Surgeon: Wilhelmenia Aloha Raddle., MD;  Location: [facility];  Service: Gastroenterology;;   CLEDA REMOVAL  06/02/2023   Procedure:  STENT REMOVAL;  Surgeon: Wilhelmenia Aloha Raddle., MD;  Location: [facility];  Service: Gastroenterology;;   CLEDA REMOVAL  07/09/2023   Procedure: STENT REMOVAL;  Surgeon: Abran Norleen SAILOR, MD;  Location: [facility];  Service: Gastroenterology;;   CLEDA REMOVAL  01/05/2024   Procedure: STENT REMOVAL;  Surgeon: Wilhelmenia Aloha Raddle., MD;  Location: [facility];  Service: Gastroenterology;;   UPPER ESOPHAGEAL ENDOSCOPIC ULTRASOUND (EUS) N/A 12/02/2021   Procedure: UPPER ESOPHAGEAL ENDOSCOPIC ULTRASOUND (EUS);  Surgeon: Wilhelmenia Aloha Raddle., MD;  Location: [facility];  Service: Gastroenterology;  Laterality: N/A;   UPPER ESOPHAGEAL ENDOSCOPIC ULTRASOUND (EUS) N/A 12/01/2022   Procedure: UPPER ESOPHAGEAL ENDOSCOPIC ULTRASOUND (EUS);  Surgeon: Wilhelmenia Aloha Raddle., MD;  Location: [facility];  Service: Gastroenterology;  Laterality: N/A;     reports that he has been smoking cigars. He started smoking about 21 years ago. He has never used smokeless tobacco. He reports that he does not currently use alcohol. He reports that he does not use drugs.  Allergies  Allergen Reactions   Other Anaphylaxis and Other (See Comments)    [name] - anaphylaxis    Family History  Problem Relation Age of Onset   Hypertension Mother    Alcoholism Maternal Uncle    Diabetes Maternal Grandmother    Diabetes Maternal Grandfather    Diabetes Paternal Grandmother    Diabetes Paternal Grandfather    Colon cancer Neg Hx    Esophageal cancer Neg Hx    Inflammatory bowel disease Neg Hx    Liver disease Neg Hx    Pancreatic cancer Neg Hx    Rectal cancer Neg Hx    Stomach cancer Neg Hx     Prior to Admission medications   Medication Sig Start Date End Date Taking? Authorizing Provider  carvedilol  (COREG ) 12.5 MG tablet Take 1 tablet (12.5 mg total) by mouth 2 (two) times daily with a meal. 03/29/24   Drusilla, Sabas RAMAN, MD  Continuous Glucose Receiver (DEXCOM G7 RECEIVER) DEVI Use as directed. 04/05/24      Continuous Glucose Sensor (DEXCOM G7 SENSOR) MISC 1 Device by Does not apply route as directed. Patient not taking: Reported on 02/16/2024 09/19/23   [name], Donell Cardinal, MD  Continuous Glucose Sensor (DEXCOM G7 SENSOR) MISC Use as directed and change every 10 days. 04/05/24     gabapentin  (NEURONTIN ) 100 MG capsule Take 1 capsule (100 mg total) by mouth 2 (two) times daily. 02/16/24   Leigh Venetia CROME, MD  insulin  aspart (NOVOLOG  FLEXPEN) 100 UNIT/ML FlexPen Max daily 30 units 09/19/23   [name], Ibtehal Jaralla, MD  insulin  glargine (LANTUS  SOLOSTAR) 100 UNIT/ML Solostar Pen Inject 16 Units into the skin daily. 09/19/23   [name], Ibtehal Jaralla, MD  Insulin  Pen Needle 32G X 4 MM MISC 1 Device by Does not apply route in the morning, at noon, in the evening, and at bedtime. 09/19/23   [name], Ibtehal Jaralla, MD  lipase/protease/amylase (CREON ) 36000 UNITS CPEP capsule Take 1-2 capsules (36,000-72,000 Units total) by mouth See admin instructions. Take 72,000 units by mouth three times a day before meals and 36,000 units with any snacks 01/25/24   [name], Gabriel Jr., MD  multivitamin (ONE-A-DAY MEN'S) TABS tablet Take 1 tablet by mouth daily with breakfast. 01/12/24   [name], Dinah C, NP  naloxone  (NARCAN ) nasal spray 4 mg/0.1 mL Place 1 spray into the nose once as needed (Overdose). 04/05/23   [provider]  omeprazole  (PRILOSEC) 40 MG capsule TAKE 1 CAPSULE (40  MG TOTAL) BY MOUTH DAILY. 10/31/23   [name], Dinah C, NP  oxyCODONE  (OXY IR/ROXICODONE ) 5 MG immediate release tablet Take 1 tablet (5 mg total) by mouth every 4 (four) hours as needed for severe pain (pain score 7-10). 07/23/24   [name], Dinah C, NP  potassium citrate  (UROCIT-K ) 10 MEQ (1080 MG) SR tablet Take 1 tablet (10 mEq total) by mouth 2 (two) times daily after a meal. 04/05/24       Physical Exam: Vitals:   08/01/24 2336  BP: (!) 152/112  Pulse: (!) 119  Resp: 20  Temp: 98.3 F (36.8 C)  TempSrc:  Oral  SpO2: 100%    Constitutional: NAD, calm, comfortable Vitals:   08/01/24 2336  BP: (!) 152/112  Pulse: (!) 119  Resp: 20  Temp: 98.3 F (36.8 C)  TempSrc: Oral  SpO2: 100%   Eyes: PERRL, lids and conjunctivae normal ENMT: Mucous membranes are moist. Posterior pharynx clear of any exudate or lesions.Normal dentition.  Neck: normal, supple, no masses, no thyromegaly Respiratory: clear to auscultation bilaterally, no wheezing, no crackles. Normal respiratory effort. No accessory muscle use.  Cardiovascular: Regular rate and rhythm, no murmurs / rubs / gallops. No extremity edema. 2+ pedal pulses. Abdomen: + tenderness RUQ/epigastrium, no masses palpated. No hepatosplenomegaly. Bowel sounds positive.  Musculoskeletal: no clubbing / cyanosis. No joint deformity upper and lower extremities. Good ROM, no contractures. Normal muscle tone.  Skin: no rashes, lesions, ulcers. No induration Neurologic: CN 2-12 grossly intact. Sensation intact, DTR normal. Strength 5/5 in all 4.  Psychiatric: Normal judgment and insight. Alert and oriented x 3. Normal mood.    Labs on Admission: I have personally reviewed following labs and imaging studies  CBC: Recent Labs  Lab 08/01/24 2348  WBC 5.3  HGB 13.3  HCT 39.1  MCV 107.1*  PLT 175   Basic Metabolic Panel: Recent Labs  Lab 08/01/24 2348  NA 135  K 3.8  CL 97*  CO2 14*  GLUCOSE 221*  BUN <5*  CREATININE 0.79  CALCIUM  9.7   GFR: CrCl cannot be calculated (Unknown ideal weight.). Liver Function Tests: Recent Labs  Lab 08/01/24 2348  AST 90*  ALT 73*  ALKPHOS 241*  BILITOT 0.6  PROT 7.7  ALBUMIN  4.7   Recent Labs  Lab 08/01/24 2348  LIPASE 12   No results for input(s): AMMONIA in the last 168 hours. Coagulation Profile: No results for input(s): INR, PROTIME in the last 168 hours. Cardiac Enzymes: No results for input(s): CKTOTAL, CKMB, CKMBINDEX, TROPONINI in the last 168 hours. BNP (last 3  results) No results for input(s): PROBNP in the last 8760 hours. HbA1C: No results for input(s): HGBA1C in the last 72 hours. CBG: No results for input(s): GLUCAP in the last 168 hours. Lipid Profile: No results for input(s): CHOL, HDL, LDLCALC, TRIG, CHOLHDL, LDLDIRECT in the last 72 hours. Thyroid  Function Tests: No results for input(s): TSH, T4TOTAL, FREET4, T3FREE, THYROIDAB in the last 72 hours. Anemia Panel: No results for input(s): VITAMINB12, FOLATE, FERRITIN, TIBC, IRON, RETICCTPCT in the last 72 hours. Urine analysis:    Component Value Date/Time   COLORURINE STRAW (A) 03/27/2024 1713   APPEARANCEUR CLEAR 03/27/2024 1713   LABSPEC 1.016 03/27/2024 1713   PHURINE 5.0 03/27/2024 1713   GLUCOSEU >=500 (A) 03/27/2024 1713   HGBUR MODERATE (A) 03/27/2024 1713   BILIRUBINUR NEGATIVE 03/27/2024 1713   KETONESUR 80 (A) 03/27/2024 1713   PROTEINUR 100 (A) 03/27/2024 1713   NITRITE NEGATIVE 03/27/2024 1713  LEUKOCYTESUR NEGATIVE 03/27/2024 1713    Radiological Exams on Admission: DG Chest 2 View Result Date: 08/01/2024 EXAM: 2 VIEW(S) XRAY OF THE CHEST 08/01/2024 11:54:22 PM COMPARISON: 03/27/2024 CLINICAL HISTORY: CP. Pancreatitis, upper abdominal and chest pain FINDINGS: LUNGS AND PLEURA: No focal pulmonary opacity. No pulmonary edema. No pleural effusion. No pneumothorax. HEART AND MEDIASTINUM: No acute abnormality of the cardiac and mediastinal silhouettes. BONES AND SOFT TISSUES: No acute osseous abnormality. IMPRESSION: 1. No acute abnormalities. Electronically signed by: Pinkie Pebbles MD 08/01/2024 11:57 PM EDT RP Workstation: [id number]    EKG: Independently reviewed.   Assessment/Plan Type1 DM with DKA -admit to step down -continue on insulin  drip per  dka protocol  - bmp q4h  -vbg now  -ivfs per protocol  -npo -strict I/o    Progressive Right upper quad and epigastric pain  - similar symptoms of prior pancreatic flare   - CT abd pending  -also concern for possible duodenitis/gastritis -supportive pain medications  -supportive ivfs  -ppi and carafate  trial   Elevated LFTS with cholestatic picture -Alt 73, AST 90 , Alphos 241  -known history of prior biliary stents -possible obstruction as cause of pain  -monitor labs  -MRCP  in am  - gi consult based on results of MRCP   Hypertension -resume carvedilol    Hx of ETOH  --patient states in remission   Chronic pain  -awaiting pain clinic referral  DVT prophylaxis: heparin  Code Status: full/ as discussed per patient wishes in event of cardiac arrest  Family Communication: none at bedside Disposition Plan: patient  expected to be admitted greater than 2 midnights  Consults called: consider GI consult based on response to supportive / results of MRCP Admission status: Step down   Camila DELENA Ned MD [facility]  If 7PM-7AM, please contact night-coverage [email or web address] Password [email or web address]  08/02/2024, 2:18 AM

## 2024-08-02 NOTE — Plan of Care (Signed)
 Will complete transition from insulin  drip to sliding scale by 1820 this evening.  MRI performed today, will be NPO after 0600 tomorrow and have ultrasound performed.

## 2024-08-02 NOTE — Hospital Course (Signed)
 38yo with h/o anxiety, T1DM, HTN, diabetic gastroparesis, and pancreatitis who presented on 9/24 with abdominal pain and was found to be in DKA.  He has chronic pain and ran out of chronic pain medications, unable to get into pain clinic.  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

## 2024-08-03 ENCOUNTER — Inpatient Hospital Stay (HOSPITAL_COMMUNITY)

## 2024-08-03 ENCOUNTER — Encounter (HOSPITAL_COMMUNITY): Payer: Self-pay | Admitting: Internal Medicine

## 2024-08-03 DIAGNOSIS — R1011 Right upper quadrant pain: Secondary | ICD-10-CM | POA: Diagnosis not present

## 2024-08-03 DIAGNOSIS — K861 Other chronic pancreatitis: Secondary | ICD-10-CM

## 2024-08-03 DIAGNOSIS — R7989 Other specified abnormal findings of blood chemistry: Secondary | ICD-10-CM | POA: Diagnosis not present

## 2024-08-03 DIAGNOSIS — E1010 Type 1 diabetes mellitus with ketoacidosis without coma: Secondary | ICD-10-CM | POA: Diagnosis not present

## 2024-08-03 LAB — BASIC METABOLIC PANEL WITH GFR
Anion gap: 10 (ref 5–15)
BUN: 6 mg/dL (ref 6–20)
CO2: 22 mmol/L (ref 22–32)
Calcium: 8.7 mg/dL — ABNORMAL LOW (ref 8.9–10.3)
Chloride: 103 mmol/L (ref 98–111)
Creatinine, Ser: 0.84 mg/dL (ref 0.61–1.24)
GFR, Estimated: >60 mL/min (ref >60)
Glucose, Bld: 265 mg/dL — ABNORMAL HIGH (ref 70–99)
Potassium: 3.7 mmol/L (ref 3.5–5.1)
Sodium: 135 mmol/L (ref 135–145)

## 2024-08-03 LAB — HEPATIC FUNCTION PANEL
ALT: 45 U/L — ABNORMAL HIGH (ref 0–44)
AST: 61 U/L — ABNORMAL HIGH (ref 15–41)
Albumin: 3.3 g/dL — ABNORMAL LOW (ref 3.5–5.0)
Alkaline Phosphatase: 166 U/L — ABNORMAL HIGH (ref 38–126)
Bilirubin, Direct: 0.4 mg/dL — ABNORMAL HIGH (ref 0.0–0.2)
Indirect Bilirubin: 0.4 mg/dL (ref 0.3–0.9)
Total Bilirubin: 0.8 mg/dL (ref 0.0–1.2)
Total Protein: 4.8 g/dL — ABNORMAL LOW (ref 6.5–8.1)

## 2024-08-03 LAB — GLUCOSE, CAPILLARY
Glucose-Capillary: 225 mg/dL — ABNORMAL HIGH (ref 70–99)
Glucose-Capillary: 81 mg/dL (ref 70–99)
Glucose-Capillary: 121 mg/dL — ABNORMAL HIGH (ref 70–99)
Glucose-Capillary: 249 mg/dL — ABNORMAL HIGH (ref 70–99)
Glucose-Capillary: 256 mg/dL — ABNORMAL HIGH (ref 70–99)

## 2024-08-03 LAB — HEPATITIS PANEL, ACUTE
HCV Ab: NONREACTIVE
Hep A IgM: NONREACTIVE
Hep B C IgM: NONREACTIVE
Hepatitis B Surface Ag: NONREACTIVE

## 2024-08-03 MED ORDER — LACTATED RINGERS IV SOLN: INTRAVENOUS | Status: AC

## 2024-08-03 NOTE — Plan of Care (Signed)
   Problem: Education: Goal: Knowledge of General Education information will improve Description Including pain rating scale, medication(s)/side effects and non-pharmacologic comfort measures Outcome: Progressing   Problem: Health Behavior/Discharge Planning: Goal: Ability to manage health-related needs will improve Outcome: Progressing

## 2024-08-03 NOTE — Anesthesia Preprocedure Evaluation (Addendum)
 Anesthesia Evaluation  Patient identified by MRN, date of birth, ID band Patient awake    Reviewed: Allergy & Precautions, NPO status , Patient's Chart, lab work & pertinent test results  History of Anesthesia Complications Negative for: history of anesthetic complications  Airway Mallampati: II  TM Distance: >3 FB Neck ROM: Full    Dental no notable dental hx. (+) Teeth Intact, Dental Advisory Given   Pulmonary Current Smoker and Patient abstained from smoking.   Pulmonary exam normal breath sounds clear to auscultation       Cardiovascular negative cardio ROS Normal cardiovascular exam Rhythm:Regular Rate:Normal  EKG 08/01/24 Sinus tachycardia, RAD   Neuro/Psych  PSYCHIATRIC DISORDERS Anxiety     negative neurological ROS     GI/Hepatic ,GERD  Medicated,,(+)     substance abuse  alcohol useRight upper abdominal pain Hx of Pancreatitis Hx of Biliary Stent Biliary Stricture   Endo/Other  diabetes, Poorly Controlled, Type 2, Insulin  Dependent   Na 129 (baseline low 130's)    Renal/GU Renal disease  negative genitourinary   Musculoskeletal negative musculoskeletal ROS (+)    Abdominal   Peds  Hematology  (+) Blood dyscrasia, anemia   Anesthesia Other Findings   Reproductive/Obstetrics                              Anesthesia Physical Anesthesia Plan  ASA: 3  Anesthesia Plan: MAC   Post-op Pain Management: Minimal or no pain anticipated   Induction: Intravenous  PONV Risk Score and Plan: 3 and Ondansetron , Treatment may vary due to age or medical condition and Propofol  infusion  Airway Management Planned: Natural Airway, Nasal Cannula and Simple Face Mask  Additional Equipment: None  Intra-op Plan:   Post-operative Plan:   Informed Consent: I have reviewed the patients History and Physical, chart, labs and discussed the procedure including the risks, benefits and  alternatives for the proposed anesthesia with the patient or authorized representative who has indicated his/her understanding and acceptance.     Dental advisory given  Plan Discussed with: CRNA and Anesthesiologist  Anesthesia Plan Comments:         Anesthesia Quick Evaluation

## 2024-08-03 NOTE — Plan of Care (Signed)

## 2024-08-03 NOTE — Progress Notes (Signed)
   08/03/24 1541  TOC Brief Assessment  Insurance and Status Reviewed  Patient has primary care physician Yes  Home environment has been reviewed Single family home  Prior level of function: Independent  Prior/Current Home Services No current home services  Social Drivers of Health Review SDOH reviewed no interventions necessary  Readmission risk has been reviewed Yes  Transition of care needs no transition of care needs at this time

## 2024-08-03 NOTE — Progress Notes (Signed)
 Progress Note   Patient: Carlos [name] FMW:[id number] DOB: [birth year] DOA: 08/01/2024     1 DOS: the patient was seen and examined on 08/03/2024   Brief hospital course: 38yo with h/o anxiety, T1DM, HTN, diabetic gastroparesis, and pancreatitis who presented on 9/24 with abdominal pain and was found to be in DKA.  He has chronic pain and ran out of chronic pain medications, unable to get into pain clinic.  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  GI is consulting, RUQ US  pending, EGD tentatively planned for 9/27.  Assessment and Plan:  Type 1 DM with DKA Admitted to stepdown unit Uncertain etiology - denies medication changes or noncompliance, dehydration, recent infection Treated with insulin  drip -> transition order set On most recent labs, gap is closed, acidosis resolved Currently on Lantus  12 units daily, very sensitive scale SSI with mealtime and at bedtime coverage  Progressive RUQ/epigastric pain with mildly abnormal LFTs Similar symptoms of prior pancreatic flare  CT A/P without apparent acute pancreatitis, ?duodenitis MRCP without acute pancreatitis Supportive pain medications  PPI and carafate  trial  Mildly elevated LFTs GI consulting RUQ US  pending Tentatively planned for EGD on 9/27 ?need for future EUS   Elevated LFTS with cholestatic picture Alt 73, AST 90 , Alphos 241  Known history of prior biliary stents Possible obstruction as cause of pain  MRCP ordered and showed 2 x 2 cm cystic lesion in pancreatic tail - pseduocyst vs. 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    Chronic pain  Awaiting pain clinic referral  Previously on PO Dilaudid , currently on oxy He is receiving 60 pills per month by his PCP but reports that this is only a 10 day supply and he is having to space them out to last all month We discussed that opiates are not recommended as chronic pancreatitis pain management Will continue to consider potential causes of pain and other options for treatment           Consultants: GI DM coordinator   Procedures: None   Antibiotics: None    30 Day Unplanned Readmission Risk Score    Flowsheet Row ED to Hosp-Admission (Current) from 08/01/2024 in [facility]-ICU/STEPDOWN  30 Day Unplanned Readmission Risk Score (%) 18.58 Filed at 08/03/2024 0400    This score is the patient's risk of an unplanned readmission within 30 days of being discharged (0 -100%). The score is based on dignosis, age, lab data, medications, orders, and past utilization.   Low:  0-14.9   Medium: 15-21.9   High: 22-29.9   Extreme: 30 and above           Subjective: Still with RUQ pain.  Otherwise no new issues.   Objective: Vitals:   08/03/24 1200 08/03/24 1203  BP: 106/69   Pulse: 63   Resp: (!) 8   Temp:  97.8 F (36.6 C)  SpO2: 100%     Intake/Output Summary (Last 24 hours) at 08/03/2024 1521 Last data filed at  08/02/2024 2205 Gross per 24 hour  Intake 1019.61 ml  Output --  Net 1019.61 ml   Filed Weights   08/02/24 0400  Weight: 61.7 kg    Exam:  General:  Appears calm and comfortable and is in NAD Eyes:  normal lids, iris ENT:  grossly normal hearing, lips & tongue, mmm Cardiovascular:  RRR. No LE edema.  Respiratory:   CTA bilaterally with no wheezes/rales/rhonchi.  Normal respiratory effort. Abdomen:  soft, RUQ TTP, ND Skin:  no rash or induration seen on limited exam Musculoskeletal:   grossly normal tone BUE/BLE, good ROM, no bony abnormality Psychiatric:  grossly normal mood and affect, speech fluent and appropriate, AOx3 Neurologic:  CN 2-12 grossly intact, moves all extremities in coordinated fashion  Data Reviewed: I have reviewed the patient's lab results since admission.  Pertinent labs for today include:   Glucose 265 AP 166 Albumin  3.3 AST 61/ALT 45 Hepatitis panel pending     Family Communication: None present      Code Status: Full Code    Disposition: Status is: Inpatient Remains inpatient appropriate because: ongoing evaluation     Time spent: 50 minutes  Unresulted Labs (From admission, onward)     Start     Ordered   08/04/24 0500  Hepatic function panel Once  Tomorrow morning,   R       Question:  Specimen collection method  Answer:  Lab=Lab collect   08/03/24 1223   08/04/24 0500  CBC  Tomorrow morning,   R       Question:  Specimen collection method  Answer:  Lab=Lab collect   08/03/24 1223   08/03/24 0500  Basic metabolic panel  Daily,   R      08/02/24 1551             Author: Delon Herald, MD 08/03/2024 3:21 PM  For on call review [email or web address].

## 2024-08-03 NOTE — Inpatient Diabetes Management (Addendum)
 Inpatient Diabetes Program Recommendations  AACE/ADA: New Consensus Statement on Inpatient Glycemic Control (2015)  Target Ranges:  Prepandial:   less than 140 mg/dL      Peak postprandial:   less than 180 mg/dL (1-2 hours)      Critically ill patients:  140 - 180 mg/dL   Lab Results  Component Value Date   GLUCAP 225 (H) 08/03/2024   HGBA1C 9.3 (H) 08/02/2024    Review of Glycemic Control  Latest Reference Range & Units 08/02/24 13:56 08/02/24 15:04 08/02/24 16:03 08/02/24 17:21 08/02/24 18:14 08/02/24 21:20 08/03/24 07:37  Glucose-Capillary 70 - 99 mg/dL 876 (H) 856 (H) 871 (H) 148 (H) 149 (H) 89 225 (H)  (H): Data is abnormally high Diabetes history: DM1  Outpatient Diabetes medications: Lantus  16 units daily, Novolog  5 units TID with meals and Novolog  correction (BG -130/50)  Patient sees Dr Sam as his Endocrinologist.    Current orders for Inpatient glycemic control: Lantus  12 units QD, Novolog  2 units TID, Novolog  0-6 units TID & HS   Inpatient Diabetes Program Recommendations:   Consider slight increase to Lantus : 16 units every day.   Thanks, Tinnie Minus, MSN, RNC-OB Diabetes Coordinator [phone] (8a-5p)

## 2024-08-03 NOTE — Plan of Care (Signed)

## 2024-08-03 NOTE — Consult Note (Addendum)
 Referring Provider: Dr. Delon Herald  Primary Care Physician:  [name], NP Primary Gastroenterologist:  Dr.[name]   Reason for Consultation:  RUQ pain   HPI: Carlos [name] is a 38 y.o. 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  He presented to the ED 08/01/2024 due to RUQ pain which has progressively worsened over the past 3 weeks.  Labs in the ED showed a WBC count 5.3.  Hemoglobin 13.3.  MCV 107.1.  Platelets 175.  BUN < 5.  Creatinine 0.79.  Total bili 0.6.  Alk phos 241.  AST 90.  ALT 73. (AST 56/ALT 38 on 04/04/2024).  Lipase 12.  Negative chest x-ray.  Chest CTA negative for PE.  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 The liver and gallbladder were unremarkable without biliary ductal dilatation.  Abdominal MRI with and without contrast 9/25 showed a 2 x 2 cm cystic lesion in the pancreatic tail, a 2 x 2 cm structure in the pancreatic tail which is new when compared to prior MRI imaging 04/2023 (possibly representing an intrapancreatic pseudocyst versus cystic pancreatic lesion), main pancreatic duct was not dilated, no peripancreatic fat stranding, a 4.5 x 6 cm area in the right hepatic lobe compatible with focal hepatic steatosis, the liver was mildly enlarged without evidence of cirrhosis, no intrahepatic/extrahepatic biliary ductal  dilatation or choledocholithiasis and the gallbladder was unremarkable.  Labs 08/02/2024: Hemoglobin 12.2.  Platelets 154.  D-dimer 0.28.  Hemoglobin A1c 9.3%.  Lactic acid 2.4, repeat level 1.7.  Total bili 0.6.  Alk phos 205.  AST 68.  ALT 61.  Acetaminophen  level < 10.  Ethyl alcohol < 15.  Urine drug screen positive for Fentanyl  (On Fentanyl  for pain since admission).  He endorses having chronic epigastric pain which he attributes to his chronic pancreatitis pain.  He developed a new RUQ pain 3 weeks ago which has progressively worsened.  No nausea or vomiting.  He endorses taking Aleve 2 tabs twice daily for the past 2 to 3 years for his chronic epigastric pain.  He sometimes takes Aleve PM for sleep as well.  He denies having any heartburn or dysphagia.  Bowel movements are normal, no bloody or black stools.  Endorses losing 5 to 8 pounds over the past month.  No fevers.  Infrequently has night sweats.  He smokes 1 cigar daily.  He has been abstinent from alcohol x 4 years.  No drug use.  As noted in the HPI, he has a history of chronic pancreatitis with associated PD and biliary strictures status post multiple ERCPs with pancreatic and biliary stent placement.  He underwent a celiac plexus block 11/2022.  His most recent ERCP by Dr. Wilhelmenia was 01/05/2024 which showed the prior biliary and pancreatic sphincterotomies appeared normal, the biliary stent was removed, a mid biliary inflammatory stricture was found in the lower third of the main bile duct sphincterotomy to the distal biliary narrowing/stenosis was performed. Biliary stent and CBD brushings were benign.  A repeat MRI/MRCP in 4 to 8 weeks was recommended, was not done. See summary of GI procedures below.  GI PROCEDURES:   ERCP 01/05/2024:  - Prior biliary sphincterotomy appeared open.  - Prior pancreatic sphincterotomy appeared open.  - One visibly patent stent from the biliary tree was seen in the major papilla. This was removed and  sent for cytology.   - A single mild biliary stricture was found in the lower third of the main bile duct. The stricture was inflammatory.  - The biliary tree was swept and sludge was found.  - Sphincteroplasty with 6 mm HurriCaine performed to the distal biliary narrowing/stenosis.   - Cells for cytology obtained from the stenosis region.  A. BILIARY STENT:  - NO MALIGNANT CELLS INDICATED  - CANDIDA  B. COMMON BILE DUCT, BRUSHING:  - NO MALIGNANT CELLS INDICATED    EGD 07/09/2023:  1. Selective removal of pancreatic duct stent  2. Biliary stent remains in place  3. Otherwise unremarkable exam with side-viewing endoscope.   ERCP 06/02/2023  - Prior biliary sphincterotomy appeared open. Biliary stent was in position. This was removed and sent for cytology.  - Prior pancreatic sphincterotomy appeared open. 2 pancreatic stents were in position. These were removed. - A single moderate biliary stricture was found in the lower third of the main bile duct. The stricture was inflammatory in appearance. This was brushed for cytology (previous brushings were negative).   - The upper third of the main bile duct and middle third of the main bile duct were mildly dilated, likely due to the inflammatory appearing stricture noted above.  - The biliary tree was swept and sludge was found. The gallbladder began to fill on cholangiogram.  - Biliary sphincterotomy extension was performed.  - One plastic biliary stent was placed into the common bile duct. Had difficulty placing 2 separate biliary stents so just the left one in place.  - A filling defect consistent with sludge was seen on the ventral pancreatogram. The pancreatic duct was swept with findings of multiple pancreatic stones. Further pancreatogram did not suggest significant stricturing still present.  - One temporary plastic pancreatic stent was placed into the ventral pancreatic duct to reduce the risk of post ERCP pancreatitis.   ERCP 04/28/2023:   - A single moderately severe localized biliary stricture was found in the lower third of the main bile duct. The stricture was benign appearing. Brushed for cytology.   - The common bile duct was mildly dilated, secondary to the stricture.  - One plastic stent was placed into the common bile duct. - Prior pancreatic and biliary sphincterotomies were noted.  - Two PD stents in good postion were noted - both appeared clogged.   ERCP 02/02/2023:  - Prior biliary sphincterotomy appeared open.  - Prior pancreatic sphincterotomy appeared open.  - One partially occluded stent from the pancreatic duct was seen in the major papilla. This was removed.  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 Upstream to this stricture/narrowing, mild dilatation of the pancreatic duct in the body of the pancreas was found.  - Pancreatic stones were found. Complete removal was accomplished by balloon sweeping.  - Dilation performed of the pancreatic duct stricture. Subsequently cells for cytology obtained in the pancreatic duct in the genu of the pancreas.  - Two plastic pancreatic stents were placed into the ventral pancreatic duct to traverse the stricture and allow further dilation.   ERCP 12/01/2022:  - Prior pancreatic sphincterotomy appeared open.  -  One partially occluded stent from the pancreatic duct was seen in the major papilla. This was removed and sent for cytology.  - Alocalized irregularity/narrowing (improved from prior) was found in the ventral pancreatic duct in the head of the pancreas.  - Pancreatic stone fragments were found. Complete removal was accomplished.  - A biliary sphincterotomy was performed. The biliary tree was swept and nothing was found.  - The ventral head pancreatic duct was successfully dilated to 4 mm.  - One plastic pancreatic stent was placed into the ventral pancreatic duct.   EGD/EUS 12/01/2022:   EGD impression:  - No gross lesions in the entire esophagus. Z-line irregular,  46 cm from the incisors.  - Erythematous mucosa in the antrum. No other gross lesions in the entire stomach.  - Plastic pancreatic stent at the ampulla.  - No other gross lesions in the duodenal bulb, in the first portion of the duodenum and in the second portion of the duodenum.   EUS impression:  - A lesion was identified in the pancreatic tail. Cytology results are pending. However, the endosonographic appearance is suggestive of walled off necrosis/pancreatic pseudocyst more likely. Fine needle aspiration for fluid performed and then for tissue.   - The pancreatic duct had hyperechoic walls in the pancreatic head, genu of the pancreas and body of the pancreas; the pancreatic duct stent could be visualized in this area  - Pancreatic parenchymal abnormalities consisting of hyperechoic foci, lobularity and hyperechoic strands were noted in the entire pancreas.  - There was no sign of significant pathology in the common bile duct, in the common hepatic duct and in the gallbladder.  - No malignant-appearing lymph nodes were visualized in the celiac region (level 20), perigastric region and peripancreatic region.   - Portal collaterals are noted  - Celiac plexus block performed.   ERCP 08/12/2022:  - Previous endoscopic evaluation suggested significant gastritis which was not present on today's exam with the ERCP scope.  - The major papilla appeared to be small.   - Difficult cannulation initially but with maneuvers and transition to revolution Jagtome we were able to access the pancreatic tree.  - An irregularity was found in the ventral pancreatic duct within the head/genu of the pancreas. With stricturing noted in the genu of the pancreas.  - A pancreatic sphincterotomy was performed.  - One plastic pancreatic stent was placed into the ventral pancreatic duct and traversed the strictured area.   EUS 12/02/2021:  EGD Impression: - White nummular lesions in esophageal mucosa. Biopsied.  - LA  Grade D erosive esophagitis with no bleeding distally.  - Z-line irregular, 45 cm from the incisors.  - 1 cm hiatal hernia.  - Angulation deformities in the entire stomach.  - Erythematous mucosa in the stomach. Biopsied.  - Duodenal extrinsic deformity (seems to be the gallbladder based on the EUS more than anything else). - Normal mucosa was found in the duodenal bulb, in the first portion of the duodenum and in the second portion of the duodenum. - Normal major papilla.   EUS Impression:  - A perisplenic cystic lesion was noted.  - Pancreatic parenchymal abnormalities consisting of diffuse echogenicity, lobularity with honeycombing, hyperechoic foci without shadowing and hyperechoic strands were noted in the entire pancreas. This patient has chronic pancreatitis. The parenchyma is concerning for significant necrosis, but not clear that I have an ability to safely perform a cystgastrostomy or enterostomy.  - As noted above, the patient now has had significant changes in  the way the previous CT looks compared to now, so we did not perform any other interventions. He will need more imaging.  - There was no sign of significant pathology in the common bile duct and in the common hepatic duct.  - Hyperechoic material consistent with sludge was visualized endosonographically in the gallbladder. This was in a region consistent with the gallbladder though small chance this  could have been a peripancreatic fluid collection in that area of the RUQ as well.  - A few enlarged lymph nodes were visualized in the celiac region (level 20). Tissue has not been obtained. However, the endosonographic appearance is consistent with benign inflammatory changes.  - Endosonographically, there was normal vascular flow in the splenic vein.   Past Medical History:  Diagnosis Date   AKI (acute kidney injury) 12/21/2021   Anxiety    Diabetes (HCC)    Pancreatitis    Splenic vein thrombosis    Past Surgical History:   Procedure Laterality Date   BILIARY BRUSHING  02/02/2023   Procedure: BILIARY BRUSHING;  Surgeon: Wilhelmenia Aloha Raddle., MD;  Location: [facility];  Service: Gastroenterology;;   BILIARY BRUSHING  04/28/2023   Procedure: BILIARY BRUSHING;  Surgeon: Aneita Gwendlyn DASEN, MD;  Location: [facility];  Service: Gastroenterology;;   BILIARY BRUSHING  06/02/2023   Procedure: BILIARY BRUSHING;  Surgeon: Wilhelmenia Aloha Raddle., MD;  Location: [facility];  Service: Gastroenterology;;   BILIARY BRUSHING  01/05/2024   Procedure: BILIARY BRUSHING;  Surgeon: Wilhelmenia Aloha Raddle., MD;  Location: [facility];  Service: Gastroenterology;;   BILIARY DILATION  12/01/2022   Procedure: BILIARY DILATION;  Surgeon: Wilhelmenia Aloha Raddle., MD;  Location: [facility];  Service: Gastroenterology;;   BILIARY DILATION  02/02/2023   Procedure: BILIARY DILATION;  Surgeon: Wilhelmenia Aloha Raddle., MD;  Location: [facility];  Service: Gastroenterology;;   BILIARY DILATION  01/05/2024   Procedure: BILIARY DILATION;  Surgeon: Wilhelmenia Aloha Raddle., MD;  Location: [facility];  Service: Gastroenterology;;   BILIARY STENT PLACEMENT  04/28/2023   Procedure: BILIARY STENT PLACEMENT;  Surgeon: Aneita Gwendlyn DASEN, MD;  Location: [facility];  Service: Gastroenterology;;   BILIARY STENT PLACEMENT N/A 06/02/2023   Procedure: BILIARY STENT PLACEMENT;  Surgeon: Wilhelmenia Aloha Raddle., MD;  Location: [facility];  Service: Gastroenterology;  Laterality: N/A;   BIOPSY  12/02/2021   Procedure: BIOPSY;  Surgeon: Wilhelmenia Aloha Raddle., MD;  Location: [facility];  Service: Gastroenterology;;   ENDOSCOPIC RETROGRADE CHOLANGIOPANCREATOGRAPHY (ERCP) WITH PROPOFOL  N/A 08/12/2022   Procedure: ENDOSCOPIC RETROGRADE CHOLANGIOPANCREATOGRAPHY (ERCP) WITH PROPOFOL ;  Surgeon: Wilhelmenia Aloha Raddle., MD;  Location: [facility];  Service: Gastroenterology;  Laterality: N/A;   ENDOSCOPIC RETROGRADE CHOLANGIOPANCREATOGRAPHY (ERCP)  WITH PROPOFOL  N/A 12/01/2022   Procedure: ENDOSCOPIC RETROGRADE CHOLANGIOPANCREATOGRAPHY (ERCP) WITH PROPOFOL ;  Surgeon: Wilhelmenia Aloha Raddle., MD;  Location: [facility];  Service: Gastroenterology;  Laterality: N/A;   ENDOSCOPIC RETROGRADE CHOLANGIOPANCREATOGRAPHY (ERCP) WITH PROPOFOL  N/A 02/02/2023   Procedure: ENDOSCOPIC RETROGRADE CHOLANGIOPANCREATOGRAPHY (ERCP) WITH PROPOFOL ;  Surgeon: Wilhelmenia Aloha Raddle., MD;  Location: [facility];  Service: Gastroenterology;  Laterality: N/A;   ENDOSCOPIC RETROGRADE CHOLANGIOPANCREATOGRAPHY (ERCP) WITH PROPOFOL  N/A 06/02/2023   Procedure: ENDOSCOPIC RETROGRADE CHOLANGIOPANCREATOGRAPHY (ERCP) WITH PROPOFOL ;  Surgeon: Wilhelmenia Aloha Raddle., MD;  Location: [facility];  Service: Gastroenterology;  Laterality: N/A;   ENDOSCOPIC RETROGRADE CHOLANGIOPANCREATOGRAPHY (ERCP) WITH PROPOFOL  N/A 01/05/2024   Procedure: ENDOSCOPIC RETROGRADE CHOLANGIOPANCREATOGRAPHY (ERCP) WITH PROPOFOL ;  Surgeon: Wilhelmenia Aloha Raddle., MD;  Location: [facility];  Service: Gastroenterology;  Laterality: N/A;   ERCP N/A 04/28/2023   Procedure: ENDOSCOPIC  RETROGRADE CHOLANGIOPANCREATOGRAPHY (ERCP);  Surgeon: Aneita Gwendlyn DASEN, MD;  Location: [facility];  Service: Gastroenterology;  Laterality: N/A;   ESOPHAGOGASTRODUODENOSCOPY N/A 12/01/2022   Procedure: ESOPHAGOGASTRODUODENOSCOPY (EGD);  Surgeon: Wilhelmenia Aloha Raddle., MD;  Location: [facility];  Service: Gastroenterology;  Laterality: N/A;   ESOPHAGOGASTRODUODENOSCOPY (EGD) WITH PROPOFOL  N/A 12/02/2021   Procedure: ESOPHAGOGASTRODUODENOSCOPY (EGD) WITH PROPOFOL ;  Surgeon: Wilhelmenia Aloha Raddle., MD;  Location: [facility];  Service: Gastroenterology;  Laterality: N/A;   ESOPHAGOGASTRODUODENOSCOPY (EGD) WITH PROPOFOL  N/A 07/09/2023   Procedure: ESOPHAGOGASTRODUODENOSCOPY (EGD) WITH PROPOFOL ;  Surgeon: Abran Norleen SAILOR, MD;  Location: [facility];  Service: Gastroenterology;  Laterality: N/A;  needs ercp scope for stent  pull   FINE NEEDLE ASPIRATION N/A 12/01/2022   Procedure: FINE NEEDLE ASPIRATION (FNA) LINEAR;  Surgeon: Wilhelmenia Aloha Raddle., MD;  Location: [facility];  Service: Gastroenterology;  Laterality: N/A;   NEUROLYTIC CELIAC PLEXUS  12/01/2022   Procedure: CELIAC PLEXUS BLOCK;  Surgeon: Wilhelmenia, Aloha Raddle., MD;  Location: [facility];  Service: Gastroenterology;;   PANCREATIC STENT PLACEMENT  08/12/2022   Procedure: PANCREATIC STENT PLACEMENT;  Surgeon: Wilhelmenia Aloha Raddle., MD;  Location: [facility];  Service: Gastroenterology;;   PANCREATIC STENT PLACEMENT  12/01/2022   Procedure: PANCREATIC STENT PLACEMENT;  Surgeon: Wilhelmenia Aloha Raddle., MD;  Location: [facility];  Service: Gastroenterology;;   PANCREATIC STENT PLACEMENT  02/02/2023   Procedure: PANCREATIC STENT PLACEMENT;  Surgeon: Wilhelmenia Aloha Raddle., MD;  Location: [facility];  Service: Gastroenterology;;   PANCREATIC STENT PLACEMENT  06/02/2023   Procedure: PANCREATIC STENT PLACEMENT;  Surgeon: Wilhelmenia Aloha Raddle., MD;  Location: [facility];  Service: Gastroenterology;;   REMOVAL OF STONES  12/01/2022   Procedure: REMOVAL OF STONES;  Surgeon: Wilhelmenia Aloha Raddle., MD;  Location: [facility];  Service: Gastroenterology;;   REMOVAL OF STONES  02/02/2023   Procedure: REMOVAL OF STONES;  Surgeon: Wilhelmenia Aloha Raddle., MD;  Location: [facility];  Service: Gastroenterology;;   REMOVAL OF STONES  06/02/2023   Procedure: REMOVAL OF STONES;  Surgeon: Wilhelmenia Aloha Raddle., MD;  Location: [facility];  Service: Gastroenterology;;   REMOVAL OF STONES  01/05/2024   Procedure: REMOVAL OF SLUDGE;  Surgeon: Wilhelmenia Aloha Raddle., MD;  Location: [facility];  Service: Gastroenterology;;   ANNETT  08/12/2022   Procedure: ANNETT;  Surgeon: Wilhelmenia Aloha Raddle., MD;  Location: [facility];  Service: Gastroenterology;;   ANNETT  12/01/2022   Procedure: ANNETT;  Surgeon:  Wilhelmenia Aloha Raddle., MD;  Location: [facility];  Service: Gastroenterology;;   ANNETT  06/02/2023   Procedure: ANNETT;  Surgeon: Wilhelmenia Aloha Raddle., MD;  Location: [facility];  Service: Gastroenterology;;   CLEDA REMOVAL  12/01/2022   Procedure: STENT REMOVAL;  Surgeon: Wilhelmenia Aloha Raddle., MD;  Location: [facility];  Service: Gastroenterology;;   CLEDA REMOVAL  02/02/2023   Procedure: STENT REMOVAL;  Surgeon: Wilhelmenia Aloha Raddle., MD;  Location: [facility];  Service: Gastroenterology;;   CLEDA REMOVAL  06/02/2023   Procedure: STENT REMOVAL;  Surgeon: Wilhelmenia Aloha Raddle., MD;  Location: [facility];  Service: Gastroenterology;;   CLEDA REMOVAL  07/09/2023   Procedure: STENT REMOVAL;  Surgeon: Abran Norleen SAILOR, MD;  Location: [facility];  Service: Gastroenterology;;   CLEDA REMOVAL  01/05/2024   Procedure: STENT REMOVAL;  Surgeon: Wilhelmenia Aloha Raddle., MD;  Location: [facility];  Service: Gastroenterology;;   UPPER ESOPHAGEAL ENDOSCOPIC ULTRASOUND (EUS) N/A 12/02/2021   Procedure: UPPER ESOPHAGEAL ENDOSCOPIC ULTRASOUND (EUS);  Surgeon: Wilhelmenia Aloha Raddle., MD;  Location: [facility];  Service: Gastroenterology;  Laterality: N/A;  UPPER ESOPHAGEAL ENDOSCOPIC ULTRASOUND (EUS) N/A 12/01/2022   Procedure: UPPER ESOPHAGEAL ENDOSCOPIC ULTRASOUND (EUS);  Surgeon: Wilhelmenia Aloha Raddle., MD;  Location: [facility];  Service: Gastroenterology;  Laterality: N/A;    Prior to Admission medications   Medication Sig Start Date End Date Taking? Authorizing Provider  carvedilol  (COREG ) 12.5 MG tablet Take 1 tablet (12.5 mg total) by mouth 2 (two) times daily with a meal. 03/29/24  Yes Drusilla, Sabas RAMAN, MD  gabapentin  (NEURONTIN ) 100 MG capsule Take 1 capsule (100 mg total) by mouth 2 (two) times daily. Patient taking differently: Take 100 mg by mouth 2 (two) times daily as needed. 02/16/24  Yes Leigh Venetia CROME, MD  insulin  aspart (NOVOLOG  FLEXPEN) 100 UNIT/ML  FlexPen Max daily 30 units 09/19/23  Yes [name], Ibtehal Jaralla, MD  insulin  glargine (LANTUS  SOLOSTAR) 100 UNIT/ML Solostar Pen Inject 16 Units into the skin daily. 09/19/23  Yes [name], Ibtehal Jaralla, MD  lipase/protease/amylase (CREON ) 36000 UNITS CPEP capsule Take 1-2 capsules (36,000-72,000 Units total) by mouth See admin instructions. Take 72,000 units by mouth three times a day before meals and 36,000 units with any snacks 01/25/24  Yes [name], Gabriel Jr., MD  multivitamin (ONE-A-DAY MEN'S) TABS tablet Take 1 tablet by mouth daily with breakfast. 01/12/24  Yes [name], Dinah C, NP  naloxone  (NARCAN ) nasal spray 4 mg/0.1 mL Place 1 spray into the nose once as needed (Overdose). 04/05/23  Yes [provider]  omeprazole  (PRILOSEC) 40 MG capsule TAKE 1 CAPSULE (40 MG TOTAL) BY MOUTH DAILY. 10/31/23  Yes [name], Dinah C, NP  oxyCODONE  (OXY IR/ROXICODONE ) 5 MG immediate release tablet Take 1 tablet (5 mg total) by mouth every 4 (four) hours as needed for severe pain (pain score 7-10). 07/23/24  Yes [name], Dinah C, NP  potassium citrate  (UROCIT-K ) 10 MEQ (1080 MG) SR tablet Take 1 tablet (10 mEq total) by mouth 2 (two) times daily after a meal. 04/05/24  Yes   Continuous Glucose Receiver (DEXCOM G7 RECEIVER) DEVI Use as directed. 04/05/24     Continuous Glucose Sensor (DEXCOM G7 SENSOR) MISC Use as directed and change every 10 days. 04/05/24     Insulin  Pen Needle 32G X 4 MM MISC 1 Device by Does not apply route in the morning, at noon, in the evening, and at bedtime. 09/19/23   [name], Ibtehal Jaralla, MD    Current Facility-Administered Medications  Medication Dose Route Frequency Provider Last Rate Last Admin   acetaminophen  (TYLENOL ) tablet 650 mg  650 mg Oral Q6H PRN Debby Camila LABOR, MD       Or   acetaminophen  (TYLENOL ) suppository 650 mg  650 mg Rectal Q6H PRN Debby Camila LABOR, MD       albuterol  (PROVENTIL ) (2.5 MG/3ML) 0.083% nebulizer solution 2.5 mg  2.5  mg Nebulization Q2H PRN Debby Camila LABOR, MD       carvedilol  (COREG ) tablet 12.5 mg  12.5 mg Oral BID WC Debby Camila A, MD   12.5 mg at 08/02/24 1625   Chlorhexidine  Gluconate Cloth 2 % PADS 6 each  6 each Topical Daily Debby Camila A, MD   6 each at 08/02/24 1021   dextrose  50 % solution 0-50 mL  0-50 mL Intravenous PRN Rancour, Garnette, MD       fentaNYL  (SUBLIMAZE ) injection 12.5-50 mcg  12.5-50 mcg Intravenous Q2H PRN Debby Camila LABOR, MD   25 mcg at 08/03/24 0551   gabapentin  (NEURONTIN ) capsule 100 mg  100 mg Oral BID PRN Debby Camila LABOR, MD  heparin  injection 5,000 Units  5,000 Units Subcutaneous Q8H Debby Hitch A, MD   5,000 Units at 08/03/24 0544   insulin  aspart (novoLOG ) injection 0-5 Units  0-5 Units Subcutaneous QHS Barbarann Nest, MD       insulin  aspart (novoLOG ) injection 0-6 Units  0-6 Units Subcutaneous TID WC Barbarann Nest, MD       insulin  aspart (novoLOG ) injection 2 Units  2 Units Subcutaneous TID WC Barbarann Nest, MD   2 Units at 08/02/24 1818   insulin  glargine (LANTUS ) injection 12 Units  12 Units Subcutaneous Q24H Utomwen, Adesuwa, RPH   12 Units at 08/02/24 1622   insulin  regular, human (MYXREDLIN ) 100 units/ 100 mL infusion   Intravenous Continuous Rancour, Garnette, MD   Stopped at 08/02/24 1810   lactated ringers  bolus 1,000 mL  1,000 mL Intravenous Once Carita Garnette, MD   Held at 08/02/24 0321   lipase/protease/amylase (CREON ) capsule 72,000 Units  72,000 Units Oral TID THEOPOLIS Debby Hitch DELENA, MD   72,000 Units at 08/02/24 1625   multivitamin with minerals tablet 1 tablet  1 tablet Oral Q breakfast Debby Hitch DELENA, MD   1 tablet at 08/02/24 9257   ondansetron  (ZOFRAN ) tablet 4 mg  4 mg Oral Q6H PRN Debby Hitch DELENA, MD       Or   ondansetron  (ZOFRAN ) injection 4 mg  4 mg Intravenous Q6H PRN Debby Hitch DELENA, MD       oxyCODONE  (Oxy IR/ROXICODONE ) immediate release tablet 5 mg  5 mg Oral Q6H PRN Barbarann Nest, MD   5  mg at 08/02/24 1955   pantoprazole  (PROTONIX ) injection 40 mg  40 mg Intravenous Q12H Debby Hitch A, MD   40 mg at 08/02/24 2152   sucralfate  (CARAFATE ) 1 GM/10ML suspension 1 g  1 g Oral TID WC & HS Debby Hitch DELENA, MD   1 g at 08/02/24 2152    Allergies as of 08/01/2024 - Review Complete 08/01/2024  Allergen Reaction Noted   Other Anaphylaxis and Other (See Comments) 09/26/2020    Family History  Problem Relation Age of Onset   Hypertension Mother    Alcoholism Maternal Uncle    Diabetes Maternal Grandmother    Diabetes Maternal Grandfather    Diabetes Paternal Grandmother    Diabetes Paternal Grandfather    Colon cancer Neg Hx    Esophageal cancer Neg Hx    Inflammatory bowel disease Neg Hx    Liver disease Neg Hx    Pancreatic cancer Neg Hx    Rectal cancer Neg Hx    Stomach cancer Neg Hx     Social History   Socioeconomic History   Marital status: Single    Spouse name: Not on file   Number of children: 6   Years of education: Not on file   Highest education level: Bachelor's degree (e.g., BA, AB, BS)  Occupational History   Occupation: Museum/gallery exhibitions officer  Tobacco Use   Smoking status: Some Days    Types: Cigars    Start date: 11/08/2002   Smokeless tobacco: Never   Tobacco comments:    1/2 a day cig  Vaping Use   Vaping status: Never Used  Substance and Sexual Activity   Alcohol use: Not Currently   Drug use: Never   Sexual activity: Not on file  Other Topics Concern   Not on file  Social History Narrative   Are you right handed or left handed? Right   Are you currently employed ?  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    Who lives with you? Girl friend    What type of home do you live in: 1 story or 2 story? one    Caffiene 3 times a week    Social Drivers of Corporate investment banker Strain: Low Risk  (11/06/2023)   Overall Financial Resource Strain (CARDIA)    Difficulty of Paying Living  Expenses: Not hard at all  Food Insecurity: No Food Insecurity (08/02/2024)   Hunger Vital Sign    Worried About Running Out of Food in the Last Year: Never true    Ran Out of Food in the Last Year: Never true  Transportation Needs: No Transportation Needs (08/02/2024)   PRAPARE - Administrator, Civil Service (Medical): No    Lack of Transportation (Non-Medical): No  Physical Activity: Sufficiently Active (11/06/2023)   Exercise Vital Sign    Days of Exercise per Week: 4 days    Minutes of Exercise per Session: 60 min  Stress: No Stress Concern Present (11/06/2023)   [employer] of Occupational Health - Occupational Stress Questionnaire    Feeling of Stress : Only a little  Social Connections: Unknown (11/06/2023)   Social Connection and Isolation Panel    Frequency of Communication with Friends and Family: More than three times a week    Frequency of Social Gatherings with Friends and Family: Twice a week    Attends Religious Services: Patient declined    Database administrator or Organizations: No    Attends Engineer, structural: Not on file    Marital Status: Married  Catering manager Violence: Not At Risk (08/02/2024)   Humiliation, Afraid, Rape, and Kick questionnaire    Fear of Current or Ex-Partner: No    Emotionally Abused: No    Physically Abused: No    Sexually Abused: No    Review of Systems: Gen:See HPI. CV: Denies chest pain, palpitations or edema. Resp: Denies cough, shortness of breath of hemoptysis.  GI:See HPI.  GU : Denies urinary burning, blood in urine, increased urinary frequency or incontinence. MS: Denies joint pain, muscles aches or weakness. Derm: Denies rash, itchiness, skin lesions or unhealing ulcers. Psych: Denies depression, anxiety, memory loss or confusion. Heme: Denies easy bruising, bleeding. Neuro:  Denies headaches, dizziness or paresthesias. Endo:  Denies any problems with DM, thyroid  or adrenal  function.  Physical Exam: Vital signs in last 24 hours: Temp:  [97.8 F (36.6 C)-98.2 F (36.8 C)] 97.9 F (36.6 C) (09/26 0415) Pulse Rate:  [59-82] 59 (09/26 0500) Resp:  [7-15] 13 (09/26 0500) BP: (89-127)/(54-92) 108/74 (09/26 0500) SpO2:  [96 %-100 %] 100 % (09/26 0500) Last BM Date :  (PTA) General: Alert 38 year old male in no acute distress. Head:  Normocephalic and atraumatic. Eyes:  No scleral icterus. Conjunctiva pink. Ears:  Normal auditory acuity. Nose:  No deformity, discharge or lesions. Mouth:  Dentition intact. No ulcers or lesions.  Neck:  Supple. No lymphadenopathy or thyromegaly.  Lungs: Breath sounds clear throughout. No wheezes, rhonchi or crackles.  Heart: Regular rate and rhythm, no murmurs. Abdomen: Soft, nondistended.  Mild epigastric and RUQ tenderness without rebound or guarding.  Positive bowel sounds to all 4 quadrants.  No palpable mass.  No bruit. Rectal: Deferred. Musculoskeletal:  Symmetrical without gross deformities.  Pulses:  Normal pulses noted. Extremities:  Without clubbing or edema. Neurologic:  Alert and  oriented  x 4. No focal deficits.  Skin:  Intact without significant lesions or rashes. Psych:  Alert and cooperative. Normal mood and affect.  Intake/Output from previous day: 09/25 0701 - 09/26 0700 In: 1019.6 [I.V.:1019.6] Out: -  Intake/Output this shift: No intake/output data recorded.  Lab Results: Recent Labs    08/01/24 2348 08/02/24 0305 08/02/24 0426  WBC 5.3 5.5 6.1  HGB 13.3 12.2* 11.4*  HCT 39.1 37.7* 35.6*  PLT 175 154 135*   BMET Recent Labs    08/02/24 0847 08/02/24 1224 08/03/24 0315  NA 134* 134* 135  K 3.4* 4.0 3.7  CL 103 101 103  CO2 17* 22 22  GLUCOSE 127* 133* 265*  BUN <5* <5* 6  CREATININE 0.65 0.71 0.84  CALCIUM  8.7* 8.9 8.7*   LFT Recent Labs    08/02/24 0426  PROT 6.5  ALBUMIN  4.1  AST 68*  ALT 61*  ALKPHOS 205*  BILITOT 0.6   PT/INR No results for input(s): LABPROT,  INR in the last 72 hours. Hepatitis Panel No results for input(s): HEPBSAG, HCVAB, HEPAIGM, HEPBIGM in the last 72 hours.    Studies/Results: MR ABDOMEN MRCP W WO CONTAST Result Date: 08/02/2024 CLINICAL DATA:  Hepatitis, acute (Ped 0-17y). EXAM: MRI ABDOMEN WITHOUT AND WITH CONTRAST (INCLUDING MRCP) TECHNIQUE: Multiplanar multisequence MR imaging of the abdomen was performed both before and after the administration of intravenous contrast. Heavily T2-weighted images of the biliary and pancreatic ducts were obtained, and three-dimensional MRCP images were rendered by post processing. CONTRAST:  6mL GADAVIST  GADOBUTROL  1 MMOL/ML IV SOLN COMPARISON:  CT scan abdomen and pelvis from earlier the same day and MRI abdomen from 04/26/2023. FINDINGS: Lower chest: Unremarkable MR appearance to the lung bases. No pleural effusion. No pericardial effusion. Normal heart size. Hepatobiliary: The liver is mildly enlarged. Noncirrhotic configuration. No focal lesion. There is an approximately 4.5 x 6.0 cm geographic area in the right hepatic lobe, segments 5/6 which exhibit marked loss of signal on out of phase images, compatible with focal hepatic steatosis. 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 No intrahepatic or extrahepatic bile duct dilatation. No choledocholithiasis. Unremarkable gallbladder. Pancreas: There is a well-circumscribed T2 hyperintense nonenhancing 2.0 x 2.0 cm structure in the pancreatic tail (series 10, image 17). No communication with the pancreatic side-branch seen on the provided images. The lesion exhibits thin enhancing walls. This is new since the prior MRI abdomen from 04/26/2023. Differential diagnosis includes intrapancreatic pseudocyst versus cystic pancreatic lesion. Main pancreatic duct is not dilated. No peripancreatic fat stranding. Spleen:  Within normal limits in size and appearance. No focal mass. Adrenals/Urinary Tract:  Unremarkable adrenal glands. No hydroureteronephrosis. No suspicious renal mass. Stomach/Bowel: Visualized portions within the abdomen are unremarkable. No disproportionate dilation of bowel loops. Vascular/Lymphatic: No pathologically enlarged lymph nodes identified. No abdominal aortic aneurysm demonstrated. No ascites. Other:  None. Musculoskeletal: No suspicious bone lesions identified. IMPRESSION: 1. There is a 2.0 x 2.0 cm cystic lesion in the pancreatic tail, as described above. Differential diagnosis includes intrapancreatic pseudocyst versus cystic pancreatic lesion. Correlate clinically to determine the need for short-term follow-up with MRI abdomen in 3-6 months. 2. There is an approximately 4.5 x 6.0 cm geographic area in the right hepatic lobe, segments 5/6 which exhibit marked loss of signal on out of phase images, compatible with focal hepatic steatosis. 3. Multiple other nonacute observations, as described above. Electronically Signed   By: Ree Molt M.D.   On: 08/02/2024 15:08   MR 3D  Recon At Scanner Result Date: 08/02/2024 CLINICAL DATA:  Hepatitis, acute (Ped 0-17y). EXAM: MRI ABDOMEN WITHOUT AND WITH CONTRAST (INCLUDING MRCP) TECHNIQUE: Multiplanar multisequence MR imaging of the abdomen was performed both before and after the administration of intravenous contrast. Heavily T2-weighted images of the biliary and pancreatic ducts were obtained, and three-dimensional MRCP images were rendered by post processing. CONTRAST:  6mL GADAVIST  GADOBUTROL  1 MMOL/ML IV SOLN COMPARISON:  CT scan abdomen and pelvis from earlier the same day and MRI abdomen from 04/26/2023. FINDINGS: Lower chest: Unremarkable MR appearance to the lung bases. No pleural effusion. No pericardial effusion. Normal heart size. Hepatobiliary: The liver is mildly enlarged. Noncirrhotic configuration. No focal lesion. There is an approximately 4.5 x 6.0 cm geographic area in the right hepatic lobe, segments 5/6 which exhibit  marked loss of signal on out of phase images, compatible with focal hepatic steatosis. 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 No intrahepatic or extrahepatic bile duct dilatation. No choledocholithiasis. Unremarkable gallbladder. Pancreas: There is a well-circumscribed T2 hyperintense nonenhancing 2.0 x 2.0 cm structure in the pancreatic tail (series 10, image 17). No communication with the pancreatic side-branch seen on the provided images. The lesion exhibits thin enhancing walls. This is new since the prior MRI abdomen from 04/26/2023. Differential diagnosis includes intrapancreatic pseudocyst versus cystic pancreatic lesion. Main pancreatic duct is not dilated. No peripancreatic fat stranding. Spleen:  Within normal limits in size and appearance. No focal mass. Adrenals/Urinary Tract: Unremarkable adrenal glands. No hydroureteronephrosis. No suspicious renal mass. Stomach/Bowel: Visualized portions within the abdomen are unremarkable. No disproportionate dilation of bowel loops. Vascular/Lymphatic: No pathologically enlarged lymph nodes identified. No abdominal aortic aneurysm demonstrated. No ascites. Other:  None. Musculoskeletal: No suspicious bone lesions identified. IMPRESSION: 1. There is a 2.0 x 2.0 cm cystic lesion in the pancreatic tail, as described above. Differential diagnosis includes intrapancreatic pseudocyst versus cystic pancreatic lesion. Correlate clinically to determine the need for short-term follow-up with MRI abdomen in 3-6 months. 2. There is an approximately 4.5 x 6.0 cm geographic area in the right hepatic lobe, segments 5/6 which exhibit marked loss of signal on out of phase images, compatible with focal hepatic steatosis. 3. Multiple other nonacute observations, as described above. Electronically Signed   By: Ree Molt M.D.   On: 08/02/2024 15:08   CT ABDOMEN PELVIS W CONTRAST Result Date: 08/02/2024 EXAM: CTA CHEST PE WITH  CONTRAST CT ABDOMEN AND PELVIS WITH CONTRAST 08/02/2024 03:44:27 AM TECHNIQUE: CTA of the chest was performed after the administration of 100 mL of iohexol  (OMNIPAQUE ) 350 MG/ML injection. Multiplanar reformatted images are provided for review. MIP images are provided for review. CT of the abdomen and pelvis was performed with the administration of intravenous contrast. Automated exposure control, iterative reconstruction, and/or weight based adjustment of the mA/kV was utilized to reduce the radiation dose to as low as reasonably achievable. COMPARISON: CT abdomen / pelvis dated 03/28/2024. CLINICAL HISTORY: Pancreatitis suspected. Chest pain x3 weeks. Reports worse today. Reports pain in chest is similar to a pancreatitis flare he had previously, wbc's 5.3, GFR>60; 100 ml omni 350. FINDINGS: CHEST: PULMONARY ARTERIES: Pulmonary arteries are adequately opacified for evaluation. No intraluminal filling defect to suggest pulmonary embolism. Main pulmonary artery is normal in caliber. MEDIASTINUM: No mediastinal lymphadenopathy. The heart and pericardium demonstrate no acute abnormality. There is no acute abnormality of the thoracic aorta. LUNGS AND PLEURA: The lungs are without acute process. No focal consolidation or pulmonary edema. No pleural effusion or pneumothorax. SOFT  TISSUES AND BONES: No acute bone or soft tissue abnormality. ABDOMEN AND PELVIS: LIVER: The liver is unremarkable. GALLBLADDER AND BILE DUCTS: Gallbladder is unremarkable. No biliary ductal dilatation. SPLEEN: Spleen demonstrates no acute abnormality. PANCREAS: Coarse parenchymal calcifications with atrophy along the pancreas, reflecting sequela of prior / chronic pancreatitis. 2.1 cm pseudocyst along the pancreatic tail (image 18), similar. ADRENAL GLANDS: Adrenal glands demonstrate no acute abnormality. KIDNEYS, URETERS AND BLADDER: No stones in the kidneys or ureters. No hydronephrosis. No perinephric or periureteral stranding. Urinary  bladder is moderately distended. GI AND BOWEL: Mild wall thickening along the proximal duodenum (image 29), correlate for duodenitis. There is no bowel obstruction. No abnormal bowel wall thickening or distension. REPRODUCTIVE: Reproductive organs are unremarkable. PERITONEUM AND RETROPERITONEUM: No ascites or free air. LYMPH NODES: No lymphadenopathy. BONES AND SOFT TISSUES: No acute abnormality of the visualized bones. No focal soft tissue abnormality. 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 IMPRESSION: 1. No evidence of pulmonary embolism. Negative CT chest. 2. Chronic pancreatitis with 2.1 cm pancreatic tail pseudocyst, similar. No acute inflammatory changes. 3. Mild proximal duodenal wall thickening, suggestive of duodenitis. 4. 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 Electronically signed by: Pinkie Pebbles MD 08/02/2024 03:51 AM EDT RP Workstation: [id number]   CT Angio Chest PE W and/or Wo Contrast Result Date: 08/02/2024 EXAM: CTA CHEST PE WITH CONTRAST CT ABDOMEN AND PELVIS WITH CONTRAST 08/02/2024 03:44:27 AM TECHNIQUE: CTA of the chest was performed after the administration of 100 mL of iohexol  (OMNIPAQUE ) 350 MG/ML injection. Multiplanar reformatted images are provided for review. MIP images are provided for review. CT of the abdomen and pelvis was performed with the administration of intravenous contrast. Automated exposure control, iterative reconstruction, and/or weight based adjustment of the mA/kV was utilized to reduce the radiation dose to as low as reasonably achievable. COMPARISON: CT abdomen / pelvis dated 03/28/2024. CLINICAL HISTORY: Pancreatitis suspected. Chest pain x3 weeks. Reports worse today. Reports pain in chest is similar to a pancreatitis flare he had previously, wbc's 5.3, GFR>60; 100 ml omni 350. FINDINGS: CHEST: PULMONARY ARTERIES: Pulmonary arteries are adequately opacified for evaluation. No intraluminal filling  defect to suggest pulmonary embolism. Main pulmonary artery is normal in caliber. MEDIASTINUM: No mediastinal lymphadenopathy. The heart and pericardium demonstrate no acute abnormality. There is no acute abnormality of the thoracic aorta. LUNGS AND PLEURA: The lungs are without acute process. No focal consolidation or pulmonary edema. No pleural effusion or pneumothorax. SOFT TISSUES AND BONES: No acute bone or soft tissue abnormality. ABDOMEN AND PELVIS: LIVER: The liver is unremarkable. GALLBLADDER AND BILE DUCTS: Gallbladder is unremarkable. No biliary ductal dilatation. SPLEEN: Spleen demonstrates no acute abnormality. PANCREAS: Coarse parenchymal calcifications with atrophy along the pancreas, reflecting sequela of prior / chronic pancreatitis. 2.1 cm pseudocyst along the pancreatic tail (image 18), similar. ADRENAL GLANDS: Adrenal glands demonstrate no acute abnormality. KIDNEYS, URETERS AND BLADDER: No stones in the kidneys or ureters. No hydronephrosis. No perinephric or periureteral stranding. Urinary bladder is moderately distended. GI AND BOWEL: Mild wall thickening along the proximal duodenum (image 29), correlate for duodenitis. There is no bowel obstruction. No abnormal bowel wall thickening or distension. REPRODUCTIVE: Reproductive organs are unremarkable. PERITONEUM AND RETROPERITONEUM: No ascites or free air. LYMPH NODES: No lymphadenopathy. BONES AND SOFT TISSUES: No acute abnormality of the visualized bones. No focal soft tissue abnormality. 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 IMPRESSION: 1. No evidence of pulmonary embolism. Negative CT chest. 2. Chronic pancreatitis with 2.1 cm pancreatic tail pseudocyst,  similar. No acute inflammatory changes. 3. Mild proximal duodenal wall thickening, suggestive of duodenitis. 4. 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 Electronically signed by: Pinkie Pebbles MD 08/02/2024 03:51 AM EDT RP  Workstation: [id number]   DG Chest 2 View Result Date: 08/01/2024 EXAM: 2 VIEW(S) XRAY OF THE CHEST 08/01/2024 11:54:22 PM COMPARISON: 03/27/2024 CLINICAL HISTORY: CP. Pancreatitis, upper abdominal and chest pain FINDINGS: LUNGS AND PLEURA: No focal pulmonary opacity. No pulmonary edema. No pleural effusion. No pneumothorax. HEART AND MEDIASTINUM: No acute abnormality of the cardiac and mediastinal silhouettes. BONES AND SOFT TISSUES: No acute osseous abnormality. IMPRESSION: 1. No acute abnormalities. Electronically signed by: Pinkie Pebbles MD 08/01/2024 11:57 PM EDT RP Workstation: [id number]    IMPRESSION/PLAN:  38 year old male admitted 08/01/2024 with RUQ pain which has progressively worsened over the past 3 weeks. On chronic NSAIDs due to having chronic epigastric pain related to chronic pancreatitis. CTAP showed proximal duodenal wall thickening consistent with duodenitis. - NPO - IV fluids and pain management per the hospitalist - EGD to rule out NSAID induced PUD, benefits and risks discussed including risk with sedation, risk of bleeding, perforation and infection. Timing to be determined. Await RUQ sono results prior to pursuing endoscopic evaluation. Further recommendations per Dr. Charlanne. 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  Abdominal MRI/MRCP 9/25 showed a 2 x 2 cm structure in the pancreatic tail which is new when compared to prior MRI imaging 04/2023, possibly representing an intrapancreatic pseudocyst versus cystic pancreatic lesion. Main pancreatic duct is not dilated. No peripancreatic fat stranding.  - ? Future EUS - Continue Creon   Elevated LFTs. CTAP showed a liver and gallbladder without evidence of intra/extrahepatic biliary ductal dilatation. MRI/MRCP showed evidence of hepatic  steatosis with mild liver enlargement without evidence of cirrhosis, no intrahepatic/extrahepatic biliary ductal dilatation or choledocholithiasis and the gallbladder was unremarkable. Per Dr. Ira review of MRI/MRCP, gallbladder appeared to be more distended when compared to prior imaging, possible sludge or stones. LFTs downtrending.  Alk phos 241 -> 205. AST 90 -> 68. ALT 73 -> 61.  - RUQ sonogram to further evaluate the gallbladder - Hepatic panel and acute hepatitis panel added onto earlier a.m. lab draw  History of erosive esophagitis. 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  On insulin .   Carlos [name]  08/03/2024, 10:25 AM    Attending physician's note   I have taken history, reviewed the chart and examined the patient. I performed a substantive portion of this encounter, including complete performance of at least one of the key components, in conjunction with the APP. I agree with the Advanced Practitioner's note, impression and recommendations.   RUQ pain- ?GB vs PUD. CTAP with duodenitis. H/O NSAIDs.   Chronic pancreatitis (ETOH related) with PD/biliary stricture s/p multiple ERCPs. Most recent ERCP Feb 2025 s/p distal biliary sphincteroplasty, patent biliary and pancreatic sphincterotomy.  Stents removed.  MRCP from yesterday with new 2 cm PD tail pseudocyst, Nl PD. Pt with chronic epi pain.  Chronic main portal vein thrombosis with cavernous transformation. No need for [facility]   Plan: - IV Protonix  - RUQ US . If + for acute cholecystitis, Sx eval.  HIDA will not be helpful as he had sphincterotomy. - If -ve, EGD.   I  have reviewed MRCP. Concerned about GB distention with ?sludge/stones- on MR/CT. There is some pruning of distal CBD on MRCP. His LFTs esp alk phos has been trending up.  At this point, he does not need repeat ERCP/stenting. As suggested by radiology, rpt  MRCP in 4-6 months, trend LFTs as outpt. If increasing CBD distention or jaundice/fever or chills, then repeat ERCP. FU with Dr Wilhelmenia as outpt.   US  done but not reported yet EGD scheduled with Dr. Legrand for tomorrow at 9 AM Patient informed.   Anselm Bring, MD [facility] [phone]

## 2024-08-03 NOTE — H&P (View-Only) (Signed)
 Referring Provider: Dr. Delon Herald  Primary Care Physician:  [name], NP Primary Gastroenterologist:  Dr.[name]   Reason for Consultation:  RUQ pain   HPI: Carlos [name] is a 38 y.o. 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  He presented to the ED 08/01/2024 due to RUQ pain which has progressively worsened over the past 3 weeks.  Labs in the ED showed a WBC count 5.3.  Hemoglobin 13.3.  MCV 107.1.  Platelets 175.  BUN < 5.  Creatinine 0.79.  Total bili 0.6.  Alk phos 241.  AST 90.  ALT 73. (AST 56/ALT 38 on 04/04/2024).  Lipase 12.  Negative chest x-ray.  Chest CTA negative for PE.  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 The liver and gallbladder were unremarkable without biliary ductal dilatation.  Abdominal MRI with and without contrast 9/25 showed a 2 x 2 cm cystic lesion in the pancreatic tail, a 2 x 2 cm structure in the pancreatic tail which is new when compared to prior MRI imaging 04/2023 (possibly representing an intrapancreatic pseudocyst versus cystic pancreatic lesion), main pancreatic duct was not dilated, no peripancreatic fat stranding, a 4.5 x 6 cm area in the right hepatic lobe compatible with focal hepatic steatosis, the liver was mildly enlarged without evidence of cirrhosis, no intrahepatic/extrahepatic biliary ductal  dilatation or choledocholithiasis and the gallbladder was unremarkable.  Labs 08/02/2024: Hemoglobin 12.2.  Platelets 154.  D-dimer 0.28.  Hemoglobin A1c 9.3%.  Lactic acid 2.4, repeat level 1.7.  Total bili 0.6.  Alk phos 205.  AST 68.  ALT 61.  Acetaminophen  level < 10.  Ethyl alcohol < 15.  Urine drug screen positive for Fentanyl  (On Fentanyl  for pain since admission).  He endorses having chronic epigastric pain which he attributes to his chronic pancreatitis pain.  He developed a new RUQ pain 3 weeks ago which has progressively worsened.  No nausea or vomiting.  He endorses taking Aleve 2 tabs twice daily for the past 2 to 3 years for his chronic epigastric pain.  He sometimes takes Aleve PM for sleep as well.  He denies having any heartburn or dysphagia.  Bowel movements are normal, no bloody or black stools.  Endorses losing 5 to 8 pounds over the past month.  No fevers.  Infrequently has night sweats.  He smokes 1 cigar daily.  He has been abstinent from alcohol x 4 years.  No drug use.  As noted in the HPI, he has a history of chronic pancreatitis with associated PD and biliary strictures status post multiple ERCPs with pancreatic and biliary stent placement.  He underwent a celiac plexus block 11/2022.  His most recent ERCP by Dr. Wilhelmenia was 01/05/2024 which showed the prior biliary and pancreatic sphincterotomies appeared normal, the biliary stent was removed, a mid biliary inflammatory stricture was found in the lower third of the main bile duct sphincterotomy to the distal biliary narrowing/stenosis was performed. Biliary stent and CBD brushings were benign.  A repeat MRI/MRCP in 4 to 8 weeks was recommended, was not done. See summary of GI procedures below.  GI PROCEDURES:   ERCP 01/05/2024:  - Prior biliary sphincterotomy appeared open.  - Prior pancreatic sphincterotomy appeared open.  - One visibly patent stent from the biliary tree was seen in the major papilla. This was removed and  sent for cytology.   - A single mild biliary stricture was found in the lower third of the main bile duct. The stricture was inflammatory.  - The biliary tree was swept and sludge was found.  - Sphincteroplasty with 6 mm HurriCaine performed to the distal biliary narrowing/stenosis.   - Cells for cytology obtained from the stenosis region.  A. BILIARY STENT:  - NO MALIGNANT CELLS INDICATED  - CANDIDA  B. COMMON BILE DUCT, BRUSHING:  - NO MALIGNANT CELLS INDICATED    EGD 07/09/2023:  1. Selective removal of pancreatic duct stent  2. Biliary stent remains in place  3. Otherwise unremarkable exam with side-viewing endoscope.   ERCP 06/02/2023  - Prior biliary sphincterotomy appeared open. Biliary stent was in position. This was removed and sent for cytology.  - Prior pancreatic sphincterotomy appeared open. 2 pancreatic stents were in position. These were removed. - A single moderate biliary stricture was found in the lower third of the main bile duct. The stricture was inflammatory in appearance. This was brushed for cytology (previous brushings were negative).   - The upper third of the main bile duct and middle third of the main bile duct were mildly dilated, likely due to the inflammatory appearing stricture noted above.  - The biliary tree was swept and sludge was found. The gallbladder began to fill on cholangiogram.  - Biliary sphincterotomy extension was performed.  - One plastic biliary stent was placed into the common bile duct. Had difficulty placing 2 separate biliary stents so just the left one in place.  - A filling defect consistent with sludge was seen on the ventral pancreatogram. The pancreatic duct was swept with findings of multiple pancreatic stones. Further pancreatogram did not suggest significant stricturing still present.  - One temporary plastic pancreatic stent was placed into the ventral pancreatic duct to reduce the risk of post ERCP pancreatitis.   ERCP 04/28/2023:   - A single moderately severe localized biliary stricture was found in the lower third of the main bile duct. The stricture was benign appearing. Brushed for cytology.   - The common bile duct was mildly dilated, secondary to the stricture.  - One plastic stent was placed into the common bile duct. - Prior pancreatic and biliary sphincterotomies were noted.  - Two PD stents in good postion were noted - both appeared clogged.   ERCP 02/02/2023:  - Prior biliary sphincterotomy appeared open.  - Prior pancreatic sphincterotomy appeared open.  - One partially occluded stent from the pancreatic duct was seen in the major papilla. This was removed.  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 Upstream to this stricture/narrowing, mild dilatation of the pancreatic duct in the body of the pancreas was found.  - Pancreatic stones were found. Complete removal was accomplished by balloon sweeping.  - Dilation performed of the pancreatic duct stricture. Subsequently cells for cytology obtained in the pancreatic duct in the genu of the pancreas.  - Two plastic pancreatic stents were placed into the ventral pancreatic duct to traverse the stricture and allow further dilation.   ERCP 12/01/2022:  - Prior pancreatic sphincterotomy appeared open.  -  One partially occluded stent from the pancreatic duct was seen in the major papilla. This was removed and sent for cytology.  - Alocalized irregularity/narrowing (improved from prior) was found in the ventral pancreatic duct in the head of the pancreas.  - Pancreatic stone fragments were found. Complete removal was accomplished.  - A biliary sphincterotomy was performed. The biliary tree was swept and nothing was found.  - The ventral head pancreatic duct was successfully dilated to 4 mm.  - One plastic pancreatic stent was placed into the ventral pancreatic duct.   EGD/EUS 12/01/2022:   EGD impression:  - No gross lesions in the entire esophagus. Z-line irregular,  46 cm from the incisors.  - Erythematous mucosa in the antrum. No other gross lesions in the entire stomach.  - Plastic pancreatic stent at the ampulla.  - No other gross lesions in the duodenal bulb, in the first portion of the duodenum and in the second portion of the duodenum.   EUS impression:  - A lesion was identified in the pancreatic tail. Cytology results are pending. However, the endosonographic appearance is suggestive of walled off necrosis/pancreatic pseudocyst more likely. Fine needle aspiration for fluid performed and then for tissue.   - The pancreatic duct had hyperechoic walls in the pancreatic head, genu of the pancreas and body of the pancreas; the pancreatic duct stent could be visualized in this area  - Pancreatic parenchymal abnormalities consisting of hyperechoic foci, lobularity and hyperechoic strands were noted in the entire pancreas.  - There was no sign of significant pathology in the common bile duct, in the common hepatic duct and in the gallbladder.  - No malignant-appearing lymph nodes were visualized in the celiac region (level 20), perigastric region and peripancreatic region.   - Portal collaterals are noted  - Celiac plexus block performed.   ERCP 08/12/2022:  - Previous endoscopic evaluation suggested significant gastritis which was not present on today's exam with the ERCP scope.  - The major papilla appeared to be small.   - Difficult cannulation initially but with maneuvers and transition to revolution Jagtome we were able to access the pancreatic tree.  - An irregularity was found in the ventral pancreatic duct within the head/genu of the pancreas. With stricturing noted in the genu of the pancreas.  - A pancreatic sphincterotomy was performed.  - One plastic pancreatic stent was placed into the ventral pancreatic duct and traversed the strictured area.   EUS 12/02/2021:  EGD Impression: - White nummular lesions in esophageal mucosa. Biopsied.  - LA  Grade D erosive esophagitis with no bleeding distally.  - Z-line irregular, 45 cm from the incisors.  - 1 cm hiatal hernia.  - Angulation deformities in the entire stomach.  - Erythematous mucosa in the stomach. Biopsied.  - Duodenal extrinsic deformity (seems to be the gallbladder based on the EUS more than anything else). - Normal mucosa was found in the duodenal bulb, in the first portion of the duodenum and in the second portion of the duodenum. - Normal major papilla.   EUS Impression:  - A perisplenic cystic lesion was noted.  - Pancreatic parenchymal abnormalities consisting of diffuse echogenicity, lobularity with honeycombing, hyperechoic foci without shadowing and hyperechoic strands were noted in the entire pancreas. This patient has chronic pancreatitis. The parenchyma is concerning for significant necrosis, but not clear that I have an ability to safely perform a cystgastrostomy or enterostomy.  - As noted above, the patient now has had significant changes in  the way the previous CT looks compared to now, so we did not perform any other interventions. He will need more imaging.  - There was no sign of significant pathology in the common bile duct and in the common hepatic duct.  - Hyperechoic material consistent with sludge was visualized endosonographically in the gallbladder. This was in a region consistent with the gallbladder though small chance this  could have been a peripancreatic fluid collection in that area of the RUQ as well.  - A few enlarged lymph nodes were visualized in the celiac region (level 20). Tissue has not been obtained. However, the endosonographic appearance is consistent with benign inflammatory changes.  - Endosonographically, there was normal vascular flow in the splenic vein.   Past Medical History:  Diagnosis Date   AKI (acute kidney injury) 12/21/2021   Anxiety    Diabetes (HCC)    Pancreatitis    Splenic vein thrombosis    Past Surgical History:   Procedure Laterality Date   BILIARY BRUSHING  02/02/2023   Procedure: BILIARY BRUSHING;  Surgeon: Wilhelmenia Aloha Raddle., MD;  Location: [facility];  Service: Gastroenterology;;   BILIARY BRUSHING  04/28/2023   Procedure: BILIARY BRUSHING;  Surgeon: Aneita Gwendlyn DASEN, MD;  Location: [facility];  Service: Gastroenterology;;   BILIARY BRUSHING  06/02/2023   Procedure: BILIARY BRUSHING;  Surgeon: Wilhelmenia Aloha Raddle., MD;  Location: [facility];  Service: Gastroenterology;;   BILIARY BRUSHING  01/05/2024   Procedure: BILIARY BRUSHING;  Surgeon: Wilhelmenia Aloha Raddle., MD;  Location: [facility];  Service: Gastroenterology;;   BILIARY DILATION  12/01/2022   Procedure: BILIARY DILATION;  Surgeon: Wilhelmenia Aloha Raddle., MD;  Location: [facility];  Service: Gastroenterology;;   BILIARY DILATION  02/02/2023   Procedure: BILIARY DILATION;  Surgeon: Wilhelmenia Aloha Raddle., MD;  Location: [facility];  Service: Gastroenterology;;   BILIARY DILATION  01/05/2024   Procedure: BILIARY DILATION;  Surgeon: Wilhelmenia Aloha Raddle., MD;  Location: [facility];  Service: Gastroenterology;;   BILIARY STENT PLACEMENT  04/28/2023   Procedure: BILIARY STENT PLACEMENT;  Surgeon: Aneita Gwendlyn DASEN, MD;  Location: [facility];  Service: Gastroenterology;;   BILIARY STENT PLACEMENT N/A 06/02/2023   Procedure: BILIARY STENT PLACEMENT;  Surgeon: Wilhelmenia Aloha Raddle., MD;  Location: [facility];  Service: Gastroenterology;  Laterality: N/A;   BIOPSY  12/02/2021   Procedure: BIOPSY;  Surgeon: Wilhelmenia Aloha Raddle., MD;  Location: [facility];  Service: Gastroenterology;;   ENDOSCOPIC RETROGRADE CHOLANGIOPANCREATOGRAPHY (ERCP) WITH PROPOFOL  N/A 08/12/2022   Procedure: ENDOSCOPIC RETROGRADE CHOLANGIOPANCREATOGRAPHY (ERCP) WITH PROPOFOL ;  Surgeon: Wilhelmenia Aloha Raddle., MD;  Location: [facility];  Service: Gastroenterology;  Laterality: N/A;   ENDOSCOPIC RETROGRADE CHOLANGIOPANCREATOGRAPHY (ERCP)  WITH PROPOFOL  N/A 12/01/2022   Procedure: ENDOSCOPIC RETROGRADE CHOLANGIOPANCREATOGRAPHY (ERCP) WITH PROPOFOL ;  Surgeon: Wilhelmenia Aloha Raddle., MD;  Location: [facility];  Service: Gastroenterology;  Laterality: N/A;   ENDOSCOPIC RETROGRADE CHOLANGIOPANCREATOGRAPHY (ERCP) WITH PROPOFOL  N/A 02/02/2023   Procedure: ENDOSCOPIC RETROGRADE CHOLANGIOPANCREATOGRAPHY (ERCP) WITH PROPOFOL ;  Surgeon: Wilhelmenia Aloha Raddle., MD;  Location: [facility];  Service: Gastroenterology;  Laterality: N/A;   ENDOSCOPIC RETROGRADE CHOLANGIOPANCREATOGRAPHY (ERCP) WITH PROPOFOL  N/A 06/02/2023   Procedure: ENDOSCOPIC RETROGRADE CHOLANGIOPANCREATOGRAPHY (ERCP) WITH PROPOFOL ;  Surgeon: Wilhelmenia Aloha Raddle., MD;  Location: [facility];  Service: Gastroenterology;  Laterality: N/A;   ENDOSCOPIC RETROGRADE CHOLANGIOPANCREATOGRAPHY (ERCP) WITH PROPOFOL  N/A 01/05/2024   Procedure: ENDOSCOPIC RETROGRADE CHOLANGIOPANCREATOGRAPHY (ERCP) WITH PROPOFOL ;  Surgeon: Wilhelmenia Aloha Raddle., MD;  Location: [facility];  Service: Gastroenterology;  Laterality: N/A;   ERCP N/A 04/28/2023   Procedure: ENDOSCOPIC  RETROGRADE CHOLANGIOPANCREATOGRAPHY (ERCP);  Surgeon: Aneita Gwendlyn DASEN, MD;  Location: [facility];  Service: Gastroenterology;  Laterality: N/A;   ESOPHAGOGASTRODUODENOSCOPY N/A 12/01/2022   Procedure: ESOPHAGOGASTRODUODENOSCOPY (EGD);  Surgeon: Wilhelmenia Aloha Raddle., MD;  Location: [facility];  Service: Gastroenterology;  Laterality: N/A;   ESOPHAGOGASTRODUODENOSCOPY (EGD) WITH PROPOFOL  N/A 12/02/2021   Procedure: ESOPHAGOGASTRODUODENOSCOPY (EGD) WITH PROPOFOL ;  Surgeon: Wilhelmenia Aloha Raddle., MD;  Location: [facility];  Service: Gastroenterology;  Laterality: N/A;   ESOPHAGOGASTRODUODENOSCOPY (EGD) WITH PROPOFOL  N/A 07/09/2023   Procedure: ESOPHAGOGASTRODUODENOSCOPY (EGD) WITH PROPOFOL ;  Surgeon: Abran Norleen SAILOR, MD;  Location: [facility];  Service: Gastroenterology;  Laterality: N/A;  needs ercp scope for stent  pull   FINE NEEDLE ASPIRATION N/A 12/01/2022   Procedure: FINE NEEDLE ASPIRATION (FNA) LINEAR;  Surgeon: Wilhelmenia Aloha Raddle., MD;  Location: [facility];  Service: Gastroenterology;  Laterality: N/A;   NEUROLYTIC CELIAC PLEXUS  12/01/2022   Procedure: CELIAC PLEXUS BLOCK;  Surgeon: Wilhelmenia, Aloha Raddle., MD;  Location: [facility];  Service: Gastroenterology;;   PANCREATIC STENT PLACEMENT  08/12/2022   Procedure: PANCREATIC STENT PLACEMENT;  Surgeon: Wilhelmenia Aloha Raddle., MD;  Location: [facility];  Service: Gastroenterology;;   PANCREATIC STENT PLACEMENT  12/01/2022   Procedure: PANCREATIC STENT PLACEMENT;  Surgeon: Wilhelmenia Aloha Raddle., MD;  Location: [facility];  Service: Gastroenterology;;   PANCREATIC STENT PLACEMENT  02/02/2023   Procedure: PANCREATIC STENT PLACEMENT;  Surgeon: Wilhelmenia Aloha Raddle., MD;  Location: [facility];  Service: Gastroenterology;;   PANCREATIC STENT PLACEMENT  06/02/2023   Procedure: PANCREATIC STENT PLACEMENT;  Surgeon: Wilhelmenia Aloha Raddle., MD;  Location: [facility];  Service: Gastroenterology;;   REMOVAL OF STONES  12/01/2022   Procedure: REMOVAL OF STONES;  Surgeon: Wilhelmenia Aloha Raddle., MD;  Location: [facility];  Service: Gastroenterology;;   REMOVAL OF STONES  02/02/2023   Procedure: REMOVAL OF STONES;  Surgeon: Wilhelmenia Aloha Raddle., MD;  Location: [facility];  Service: Gastroenterology;;   REMOVAL OF STONES  06/02/2023   Procedure: REMOVAL OF STONES;  Surgeon: Wilhelmenia Aloha Raddle., MD;  Location: [facility];  Service: Gastroenterology;;   REMOVAL OF STONES  01/05/2024   Procedure: REMOVAL OF SLUDGE;  Surgeon: Wilhelmenia Aloha Raddle., MD;  Location: [facility];  Service: Gastroenterology;;   ANNETT  08/12/2022   Procedure: ANNETT;  Surgeon: Wilhelmenia Aloha Raddle., MD;  Location: [facility];  Service: Gastroenterology;;   ANNETT  12/01/2022   Procedure: ANNETT;  Surgeon:  Wilhelmenia Aloha Raddle., MD;  Location: [facility];  Service: Gastroenterology;;   ANNETT  06/02/2023   Procedure: ANNETT;  Surgeon: Wilhelmenia Aloha Raddle., MD;  Location: [facility];  Service: Gastroenterology;;   CLEDA REMOVAL  12/01/2022   Procedure: STENT REMOVAL;  Surgeon: Wilhelmenia Aloha Raddle., MD;  Location: [facility];  Service: Gastroenterology;;   CLEDA REMOVAL  02/02/2023   Procedure: STENT REMOVAL;  Surgeon: Wilhelmenia Aloha Raddle., MD;  Location: [facility];  Service: Gastroenterology;;   CLEDA REMOVAL  06/02/2023   Procedure: STENT REMOVAL;  Surgeon: Wilhelmenia Aloha Raddle., MD;  Location: [facility];  Service: Gastroenterology;;   CLEDA REMOVAL  07/09/2023   Procedure: STENT REMOVAL;  Surgeon: Abran Norleen SAILOR, MD;  Location: [facility];  Service: Gastroenterology;;   CLEDA REMOVAL  01/05/2024   Procedure: STENT REMOVAL;  Surgeon: Wilhelmenia Aloha Raddle., MD;  Location: [facility];  Service: Gastroenterology;;   UPPER ESOPHAGEAL ENDOSCOPIC ULTRASOUND (EUS) N/A 12/02/2021   Procedure: UPPER ESOPHAGEAL ENDOSCOPIC ULTRASOUND (EUS);  Surgeon: Wilhelmenia Aloha Raddle., MD;  Location: [facility];  Service: Gastroenterology;  Laterality: N/A;  UPPER ESOPHAGEAL ENDOSCOPIC ULTRASOUND (EUS) N/A 12/01/2022   Procedure: UPPER ESOPHAGEAL ENDOSCOPIC ULTRASOUND (EUS);  Surgeon: Wilhelmenia Aloha Raddle., MD;  Location: [facility];  Service: Gastroenterology;  Laterality: N/A;    Prior to Admission medications   Medication Sig Start Date End Date Taking? Authorizing Provider  carvedilol  (COREG ) 12.5 MG tablet Take 1 tablet (12.5 mg total) by mouth 2 (two) times daily with a meal. 03/29/24  Yes Drusilla, Sabas RAMAN, MD  gabapentin  (NEURONTIN ) 100 MG capsule Take 1 capsule (100 mg total) by mouth 2 (two) times daily. Patient taking differently: Take 100 mg by mouth 2 (two) times daily as needed. 02/16/24  Yes Leigh Venetia CROME, MD  insulin  aspart (NOVOLOG  FLEXPEN) 100 UNIT/ML  FlexPen Max daily 30 units 09/19/23  Yes [name], Ibtehal Jaralla, MD  insulin  glargine (LANTUS  SOLOSTAR) 100 UNIT/ML Solostar Pen Inject 16 Units into the skin daily. 09/19/23  Yes [name], Ibtehal Jaralla, MD  lipase/protease/amylase (CREON ) 36000 UNITS CPEP capsule Take 1-2 capsules (36,000-72,000 Units total) by mouth See admin instructions. Take 72,000 units by mouth three times a day before meals and 36,000 units with any snacks 01/25/24  Yes [name], Gabriel Jr., MD  multivitamin (ONE-A-DAY MEN'S) TABS tablet Take 1 tablet by mouth daily with breakfast. 01/12/24  Yes [name], Dinah C, NP  naloxone  (NARCAN ) nasal spray 4 mg/0.1 mL Place 1 spray into the nose once as needed (Overdose). 04/05/23  Yes [provider]  omeprazole  (PRILOSEC) 40 MG capsule TAKE 1 CAPSULE (40 MG TOTAL) BY MOUTH DAILY. 10/31/23  Yes [name], Dinah C, NP  oxyCODONE  (OXY IR/ROXICODONE ) 5 MG immediate release tablet Take 1 tablet (5 mg total) by mouth every 4 (four) hours as needed for severe pain (pain score 7-10). 07/23/24  Yes [name], Dinah C, NP  potassium citrate  (UROCIT-K ) 10 MEQ (1080 MG) SR tablet Take 1 tablet (10 mEq total) by mouth 2 (two) times daily after a meal. 04/05/24  Yes   Continuous Glucose Receiver (DEXCOM G7 RECEIVER) DEVI Use as directed. 04/05/24     Continuous Glucose Sensor (DEXCOM G7 SENSOR) MISC Use as directed and change every 10 days. 04/05/24     Insulin  Pen Needle 32G X 4 MM MISC 1 Device by Does not apply route in the morning, at noon, in the evening, and at bedtime. 09/19/23   [name], Ibtehal Jaralla, MD    Current Facility-Administered Medications  Medication Dose Route Frequency Provider Last Rate Last Admin   acetaminophen  (TYLENOL ) tablet 650 mg  650 mg Oral Q6H PRN Debby Camila LABOR, MD       Or   acetaminophen  (TYLENOL ) suppository 650 mg  650 mg Rectal Q6H PRN Debby Camila LABOR, MD       albuterol  (PROVENTIL ) (2.5 MG/3ML) 0.083% nebulizer solution 2.5 mg  2.5  mg Nebulization Q2H PRN Debby Camila LABOR, MD       carvedilol  (COREG ) tablet 12.5 mg  12.5 mg Oral BID WC Debby Camila A, MD   12.5 mg at 08/02/24 1625   Chlorhexidine  Gluconate Cloth 2 % PADS 6 each  6 each Topical Daily Debby Camila A, MD   6 each at 08/02/24 1021   dextrose  50 % solution 0-50 mL  0-50 mL Intravenous PRN Rancour, Garnette, MD       fentaNYL  (SUBLIMAZE ) injection 12.5-50 mcg  12.5-50 mcg Intravenous Q2H PRN Debby Camila LABOR, MD   25 mcg at 08/03/24 0551   gabapentin  (NEURONTIN ) capsule 100 mg  100 mg Oral BID PRN Debby Camila LABOR, MD  heparin  injection 5,000 Units  5,000 Units Subcutaneous Q8H Debby Hitch A, MD   5,000 Units at 08/03/24 0544   insulin  aspart (novoLOG ) injection 0-5 Units  0-5 Units Subcutaneous QHS Barbarann Nest, MD       insulin  aspart (novoLOG ) injection 0-6 Units  0-6 Units Subcutaneous TID WC Barbarann Nest, MD       insulin  aspart (novoLOG ) injection 2 Units  2 Units Subcutaneous TID WC Barbarann Nest, MD   2 Units at 08/02/24 1818   insulin  glargine (LANTUS ) injection 12 Units  12 Units Subcutaneous Q24H Utomwen, Adesuwa, RPH   12 Units at 08/02/24 1622   insulin  regular, human (MYXREDLIN ) 100 units/ 100 mL infusion   Intravenous Continuous Rancour, Garnette, MD   Stopped at 08/02/24 1810   lactated ringers  bolus 1,000 mL  1,000 mL Intravenous Once Carita Garnette, MD   Held at 08/02/24 0321   lipase/protease/amylase (CREON ) capsule 72,000 Units  72,000 Units Oral TID THEOPOLIS Debby Hitch DELENA, MD   72,000 Units at 08/02/24 1625   multivitamin with minerals tablet 1 tablet  1 tablet Oral Q breakfast Debby Hitch DELENA, MD   1 tablet at 08/02/24 9257   ondansetron  (ZOFRAN ) tablet 4 mg  4 mg Oral Q6H PRN Debby Hitch DELENA, MD       Or   ondansetron  (ZOFRAN ) injection 4 mg  4 mg Intravenous Q6H PRN Debby Hitch DELENA, MD       oxyCODONE  (Oxy IR/ROXICODONE ) immediate release tablet 5 mg  5 mg Oral Q6H PRN Barbarann Nest, MD   5  mg at 08/02/24 1955   pantoprazole  (PROTONIX ) injection 40 mg  40 mg Intravenous Q12H Debby Hitch A, MD   40 mg at 08/02/24 2152   sucralfate  (CARAFATE ) 1 GM/10ML suspension 1 g  1 g Oral TID WC & HS Debby Hitch DELENA, MD   1 g at 08/02/24 2152    Allergies as of 08/01/2024 - Review Complete 08/01/2024  Allergen Reaction Noted   Other Anaphylaxis and Other (See Comments) 09/26/2020    Family History  Problem Relation Age of Onset   Hypertension Mother    Alcoholism Maternal Uncle    Diabetes Maternal Grandmother    Diabetes Maternal Grandfather    Diabetes Paternal Grandmother    Diabetes Paternal Grandfather    Colon cancer Neg Hx    Esophageal cancer Neg Hx    Inflammatory bowel disease Neg Hx    Liver disease Neg Hx    Pancreatic cancer Neg Hx    Rectal cancer Neg Hx    Stomach cancer Neg Hx     Social History   Socioeconomic History   Marital status: Single    Spouse name: Not on file   Number of children: 6   Years of education: Not on file   Highest education level: Bachelor's degree (e.g., BA, AB, BS)  Occupational History   Occupation: Museum/gallery exhibitions officer  Tobacco Use   Smoking status: Some Days    Types: Cigars    Start date: 11/08/2002   Smokeless tobacco: Never   Tobacco comments:    1/2 a day cig  Vaping Use   Vaping status: Never Used  Substance and Sexual Activity   Alcohol use: Not Currently   Drug use: Never   Sexual activity: Not on file  Other Topics Concern   Not on file  Social History Narrative   Are you right handed or left handed? Right   Are you currently employed ?  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    Who lives with you? Girl friend    What type of home do you live in: 1 story or 2 story? one    Caffiene 3 times a week    Social Drivers of Corporate investment banker Strain: Low Risk  (11/06/2023)   Overall Financial Resource Strain (CARDIA)    Difficulty of Paying Living  Expenses: Not hard at all  Food Insecurity: No Food Insecurity (08/02/2024)   Hunger Vital Sign    Worried About Running Out of Food in the Last Year: Never true    Ran Out of Food in the Last Year: Never true  Transportation Needs: No Transportation Needs (08/02/2024)   PRAPARE - Administrator, Civil Service (Medical): No    Lack of Transportation (Non-Medical): No  Physical Activity: Sufficiently Active (11/06/2023)   Exercise Vital Sign    Days of Exercise per Week: 4 days    Minutes of Exercise per Session: 60 min  Stress: No Stress Concern Present (11/06/2023)   [employer] of Occupational Health - Occupational Stress Questionnaire    Feeling of Stress : Only a little  Social Connections: Unknown (11/06/2023)   Social Connection and Isolation Panel    Frequency of Communication with Friends and Family: More than three times a week    Frequency of Social Gatherings with Friends and Family: Twice a week    Attends Religious Services: Patient declined    Database administrator or Organizations: No    Attends Engineer, structural: Not on file    Marital Status: Married  Catering manager Violence: Not At Risk (08/02/2024)   Humiliation, Afraid, Rape, and Kick questionnaire    Fear of Current or Ex-Partner: No    Emotionally Abused: No    Physically Abused: No    Sexually Abused: No    Review of Systems: Gen:See HPI. CV: Denies chest pain, palpitations or edema. Resp: Denies cough, shortness of breath of hemoptysis.  GI:See HPI.  GU : Denies urinary burning, blood in urine, increased urinary frequency or incontinence. MS: Denies joint pain, muscles aches or weakness. Derm: Denies rash, itchiness, skin lesions or unhealing ulcers. Psych: Denies depression, anxiety, memory loss or confusion. Heme: Denies easy bruising, bleeding. Neuro:  Denies headaches, dizziness or paresthesias. Endo:  Denies any problems with DM, thyroid  or adrenal  function.  Physical Exam: Vital signs in last 24 hours: Temp:  [97.8 F (36.6 C)-98.2 F (36.8 C)] 97.9 F (36.6 C) (09/26 0415) Pulse Rate:  [59-82] 59 (09/26 0500) Resp:  [7-15] 13 (09/26 0500) BP: (89-127)/(54-92) 108/74 (09/26 0500) SpO2:  [96 %-100 %] 100 % (09/26 0500) Last BM Date :  (PTA) General: Alert 38 year old male in no acute distress. Head:  Normocephalic and atraumatic. Eyes:  No scleral icterus. Conjunctiva pink. Ears:  Normal auditory acuity. Nose:  No deformity, discharge or lesions. Mouth:  Dentition intact. No ulcers or lesions.  Neck:  Supple. No lymphadenopathy or thyromegaly.  Lungs: Breath sounds clear throughout. No wheezes, rhonchi or crackles.  Heart: Regular rate and rhythm, no murmurs. Abdomen: Soft, nondistended.  Mild epigastric and RUQ tenderness without rebound or guarding.  Positive bowel sounds to all 4 quadrants.  No palpable mass.  No bruit. Rectal: Deferred. Musculoskeletal:  Symmetrical without gross deformities.  Pulses:  Normal pulses noted. Extremities:  Without clubbing or edema. Neurologic:  Alert and  oriented  x 4. No focal deficits.  Skin:  Intact without significant lesions or rashes. Psych:  Alert and cooperative. Normal mood and affect.  Intake/Output from previous day: 09/25 0701 - 09/26 0700 In: 1019.6 [I.V.:1019.6] Out: -  Intake/Output this shift: No intake/output data recorded.  Lab Results: Recent Labs    08/01/24 2348 08/02/24 0305 08/02/24 0426  WBC 5.3 5.5 6.1  HGB 13.3 12.2* 11.4*  HCT 39.1 37.7* 35.6*  PLT 175 154 135*   BMET Recent Labs    08/02/24 0847 08/02/24 1224 08/03/24 0315  NA 134* 134* 135  K 3.4* 4.0 3.7  CL 103 101 103  CO2 17* 22 22  GLUCOSE 127* 133* 265*  BUN <5* <5* 6  CREATININE 0.65 0.71 0.84  CALCIUM  8.7* 8.9 8.7*   LFT Recent Labs    08/02/24 0426  PROT 6.5  ALBUMIN  4.1  AST 68*  ALT 61*  ALKPHOS 205*  BILITOT 0.6   PT/INR No results for input(s): LABPROT,  INR in the last 72 hours. Hepatitis Panel No results for input(s): HEPBSAG, HCVAB, HEPAIGM, HEPBIGM in the last 72 hours.    Studies/Results: MR ABDOMEN MRCP W WO CONTAST Result Date: 08/02/2024 CLINICAL DATA:  Hepatitis, acute (Ped 0-17y). EXAM: MRI ABDOMEN WITHOUT AND WITH CONTRAST (INCLUDING MRCP) TECHNIQUE: Multiplanar multisequence MR imaging of the abdomen was performed both before and after the administration of intravenous contrast. Heavily T2-weighted images of the biliary and pancreatic ducts were obtained, and three-dimensional MRCP images were rendered by post processing. CONTRAST:  6mL GADAVIST  GADOBUTROL  1 MMOL/ML IV SOLN COMPARISON:  CT scan abdomen and pelvis from earlier the same day and MRI abdomen from 04/26/2023. FINDINGS: Lower chest: Unremarkable MR appearance to the lung bases. No pleural effusion. No pericardial effusion. Normal heart size. Hepatobiliary: The liver is mildly enlarged. Noncirrhotic configuration. No focal lesion. There is an approximately 4.5 x 6.0 cm geographic area in the right hepatic lobe, segments 5/6 which exhibit marked loss of signal on out of phase images, compatible with focal hepatic steatosis. 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 No intrahepatic or extrahepatic bile duct dilatation. No choledocholithiasis. Unremarkable gallbladder. Pancreas: There is a well-circumscribed T2 hyperintense nonenhancing 2.0 x 2.0 cm structure in the pancreatic tail (series 10, image 17). No communication with the pancreatic side-branch seen on the provided images. The lesion exhibits thin enhancing walls. This is new since the prior MRI abdomen from 04/26/2023. Differential diagnosis includes intrapancreatic pseudocyst versus cystic pancreatic lesion. Main pancreatic duct is not dilated. No peripancreatic fat stranding. Spleen:  Within normal limits in size and appearance. No focal mass. Adrenals/Urinary Tract:  Unremarkable adrenal glands. No hydroureteronephrosis. No suspicious renal mass. Stomach/Bowel: Visualized portions within the abdomen are unremarkable. No disproportionate dilation of bowel loops. Vascular/Lymphatic: No pathologically enlarged lymph nodes identified. No abdominal aortic aneurysm demonstrated. No ascites. Other:  None. Musculoskeletal: No suspicious bone lesions identified. IMPRESSION: 1. There is a 2.0 x 2.0 cm cystic lesion in the pancreatic tail, as described above. Differential diagnosis includes intrapancreatic pseudocyst versus cystic pancreatic lesion. Correlate clinically to determine the need for short-term follow-up with MRI abdomen in 3-6 months. 2. There is an approximately 4.5 x 6.0 cm geographic area in the right hepatic lobe, segments 5/6 which exhibit marked loss of signal on out of phase images, compatible with focal hepatic steatosis. 3. Multiple other nonacute observations, as described above. Electronically Signed   By: Ree Molt M.D.   On: 08/02/2024 15:08   MR 3D  Recon At Scanner Result Date: 08/02/2024 CLINICAL DATA:  Hepatitis, acute (Ped 0-17y). EXAM: MRI ABDOMEN WITHOUT AND WITH CONTRAST (INCLUDING MRCP) TECHNIQUE: Multiplanar multisequence MR imaging of the abdomen was performed both before and after the administration of intravenous contrast. Heavily T2-weighted images of the biliary and pancreatic ducts were obtained, and three-dimensional MRCP images were rendered by post processing. CONTRAST:  6mL GADAVIST  GADOBUTROL  1 MMOL/ML IV SOLN COMPARISON:  CT scan abdomen and pelvis from earlier the same day and MRI abdomen from 04/26/2023. FINDINGS: Lower chest: Unremarkable MR appearance to the lung bases. No pleural effusion. No pericardial effusion. Normal heart size. Hepatobiliary: The liver is mildly enlarged. Noncirrhotic configuration. No focal lesion. There is an approximately 4.5 x 6.0 cm geographic area in the right hepatic lobe, segments 5/6 which exhibit  marked loss of signal on out of phase images, compatible with focal hepatic steatosis. 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 No intrahepatic or extrahepatic bile duct dilatation. No choledocholithiasis. Unremarkable gallbladder. Pancreas: There is a well-circumscribed T2 hyperintense nonenhancing 2.0 x 2.0 cm structure in the pancreatic tail (series 10, image 17). No communication with the pancreatic side-branch seen on the provided images. The lesion exhibits thin enhancing walls. This is new since the prior MRI abdomen from 04/26/2023. Differential diagnosis includes intrapancreatic pseudocyst versus cystic pancreatic lesion. Main pancreatic duct is not dilated. No peripancreatic fat stranding. Spleen:  Within normal limits in size and appearance. No focal mass. Adrenals/Urinary Tract: Unremarkable adrenal glands. No hydroureteronephrosis. No suspicious renal mass. Stomach/Bowel: Visualized portions within the abdomen are unremarkable. No disproportionate dilation of bowel loops. Vascular/Lymphatic: No pathologically enlarged lymph nodes identified. No abdominal aortic aneurysm demonstrated. No ascites. Other:  None. Musculoskeletal: No suspicious bone lesions identified. IMPRESSION: 1. There is a 2.0 x 2.0 cm cystic lesion in the pancreatic tail, as described above. Differential diagnosis includes intrapancreatic pseudocyst versus cystic pancreatic lesion. Correlate clinically to determine the need for short-term follow-up with MRI abdomen in 3-6 months. 2. There is an approximately 4.5 x 6.0 cm geographic area in the right hepatic lobe, segments 5/6 which exhibit marked loss of signal on out of phase images, compatible with focal hepatic steatosis. 3. Multiple other nonacute observations, as described above. Electronically Signed   By: Ree Molt M.D.   On: 08/02/2024 15:08   CT ABDOMEN PELVIS W CONTRAST Result Date: 08/02/2024 EXAM: CTA CHEST PE WITH  CONTRAST CT ABDOMEN AND PELVIS WITH CONTRAST 08/02/2024 03:44:27 AM TECHNIQUE: CTA of the chest was performed after the administration of 100 mL of iohexol  (OMNIPAQUE ) 350 MG/ML injection. Multiplanar reformatted images are provided for review. MIP images are provided for review. CT of the abdomen and pelvis was performed with the administration of intravenous contrast. Automated exposure control, iterative reconstruction, and/or weight based adjustment of the mA/kV was utilized to reduce the radiation dose to as low as reasonably achievable. COMPARISON: CT abdomen / pelvis dated 03/28/2024. CLINICAL HISTORY: Pancreatitis suspected. Chest pain x3 weeks. Reports worse today. Reports pain in chest is similar to a pancreatitis flare he had previously, wbc's 5.3, GFR>60; 100 ml omni 350. FINDINGS: CHEST: PULMONARY ARTERIES: Pulmonary arteries are adequately opacified for evaluation. No intraluminal filling defect to suggest pulmonary embolism. Main pulmonary artery is normal in caliber. MEDIASTINUM: No mediastinal lymphadenopathy. The heart and pericardium demonstrate no acute abnormality. There is no acute abnormality of the thoracic aorta. LUNGS AND PLEURA: The lungs are without acute process. No focal consolidation or pulmonary edema. No pleural effusion or pneumothorax. SOFT  TISSUES AND BONES: No acute bone or soft tissue abnormality. ABDOMEN AND PELVIS: LIVER: The liver is unremarkable. GALLBLADDER AND BILE DUCTS: Gallbladder is unremarkable. No biliary ductal dilatation. SPLEEN: Spleen demonstrates no acute abnormality. PANCREAS: Coarse parenchymal calcifications with atrophy along the pancreas, reflecting sequela of prior / chronic pancreatitis. 2.1 cm pseudocyst along the pancreatic tail (image 18), similar. ADRENAL GLANDS: Adrenal glands demonstrate no acute abnormality. KIDNEYS, URETERS AND BLADDER: No stones in the kidneys or ureters. No hydronephrosis. No perinephric or periureteral stranding. Urinary  bladder is moderately distended. GI AND BOWEL: Mild wall thickening along the proximal duodenum (image 29), correlate for duodenitis. There is no bowel obstruction. No abnormal bowel wall thickening or distension. REPRODUCTIVE: Reproductive organs are unremarkable. PERITONEUM AND RETROPERITONEUM: No ascites or free air. LYMPH NODES: No lymphadenopathy. BONES AND SOFT TISSUES: No acute abnormality of the visualized bones. No focal soft tissue abnormality. 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 IMPRESSION: 1. No evidence of pulmonary embolism. Negative CT chest. 2. Chronic pancreatitis with 2.1 cm pancreatic tail pseudocyst, similar. No acute inflammatory changes. 3. Mild proximal duodenal wall thickening, suggestive of duodenitis. 4. 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 Electronically signed by: Pinkie Pebbles MD 08/02/2024 03:51 AM EDT RP Workstation: [id number]   CT Angio Chest PE W and/or Wo Contrast Result Date: 08/02/2024 EXAM: CTA CHEST PE WITH CONTRAST CT ABDOMEN AND PELVIS WITH CONTRAST 08/02/2024 03:44:27 AM TECHNIQUE: CTA of the chest was performed after the administration of 100 mL of iohexol  (OMNIPAQUE ) 350 MG/ML injection. Multiplanar reformatted images are provided for review. MIP images are provided for review. CT of the abdomen and pelvis was performed with the administration of intravenous contrast. Automated exposure control, iterative reconstruction, and/or weight based adjustment of the mA/kV was utilized to reduce the radiation dose to as low as reasonably achievable. COMPARISON: CT abdomen / pelvis dated 03/28/2024. CLINICAL HISTORY: Pancreatitis suspected. Chest pain x3 weeks. Reports worse today. Reports pain in chest is similar to a pancreatitis flare he had previously, wbc's 5.3, GFR>60; 100 ml omni 350. FINDINGS: CHEST: PULMONARY ARTERIES: Pulmonary arteries are adequately opacified for evaluation. No intraluminal filling  defect to suggest pulmonary embolism. Main pulmonary artery is normal in caliber. MEDIASTINUM: No mediastinal lymphadenopathy. The heart and pericardium demonstrate no acute abnormality. There is no acute abnormality of the thoracic aorta. LUNGS AND PLEURA: The lungs are without acute process. No focal consolidation or pulmonary edema. No pleural effusion or pneumothorax. SOFT TISSUES AND BONES: No acute bone or soft tissue abnormality. ABDOMEN AND PELVIS: LIVER: The liver is unremarkable. GALLBLADDER AND BILE DUCTS: Gallbladder is unremarkable. No biliary ductal dilatation. SPLEEN: Spleen demonstrates no acute abnormality. PANCREAS: Coarse parenchymal calcifications with atrophy along the pancreas, reflecting sequela of prior / chronic pancreatitis. 2.1 cm pseudocyst along the pancreatic tail (image 18), similar. ADRENAL GLANDS: Adrenal glands demonstrate no acute abnormality. KIDNEYS, URETERS AND BLADDER: No stones in the kidneys or ureters. No hydronephrosis. No perinephric or periureteral stranding. Urinary bladder is moderately distended. GI AND BOWEL: Mild wall thickening along the proximal duodenum (image 29), correlate for duodenitis. There is no bowel obstruction. No abnormal bowel wall thickening or distension. REPRODUCTIVE: Reproductive organs are unremarkable. PERITONEUM AND RETROPERITONEUM: No ascites or free air. LYMPH NODES: No lymphadenopathy. BONES AND SOFT TISSUES: No acute abnormality of the visualized bones. No focal soft tissue abnormality. 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 IMPRESSION: 1. No evidence of pulmonary embolism. Negative CT chest. 2. Chronic pancreatitis with 2.1 cm pancreatic tail pseudocyst,  similar. No acute inflammatory changes. 3. Mild proximal duodenal wall thickening, suggestive of duodenitis. 4. 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 Electronically signed by: Pinkie Pebbles MD 08/02/2024 03:51 AM EDT RP  Workstation: [id number]   DG Chest 2 View Result Date: 08/01/2024 EXAM: 2 VIEW(S) XRAY OF THE CHEST 08/01/2024 11:54:22 PM COMPARISON: 03/27/2024 CLINICAL HISTORY: CP. Pancreatitis, upper abdominal and chest pain FINDINGS: LUNGS AND PLEURA: No focal pulmonary opacity. No pulmonary edema. No pleural effusion. No pneumothorax. HEART AND MEDIASTINUM: No acute abnormality of the cardiac and mediastinal silhouettes. BONES AND SOFT TISSUES: No acute osseous abnormality. IMPRESSION: 1. No acute abnormalities. Electronically signed by: Pinkie Pebbles MD 08/01/2024 11:57 PM EDT RP Workstation: [id number]    IMPRESSION/PLAN:  38 year old male admitted 08/01/2024 with RUQ pain which has progressively worsened over the past 3 weeks. On chronic NSAIDs due to having chronic epigastric pain related to chronic pancreatitis. CTAP showed proximal duodenal wall thickening consistent with duodenitis. - NPO - IV fluids and pain management per the hospitalist - EGD to rule out NSAID induced PUD, benefits and risks discussed including risk with sedation, risk of bleeding, perforation and infection. Timing to be determined. Await RUQ sono results prior to pursuing endoscopic evaluation. Further recommendations per Dr. Charlanne. 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  Abdominal MRI/MRCP 9/25 showed a 2 x 2 cm structure in the pancreatic tail which is new when compared to prior MRI imaging 04/2023, possibly representing an intrapancreatic pseudocyst versus cystic pancreatic lesion. Main pancreatic duct is not dilated. No peripancreatic fat stranding.  - ? Future EUS - Continue Creon   Elevated LFTs. CTAP showed a liver and gallbladder without evidence of intra/extrahepatic biliary ductal dilatation. MRI/MRCP showed evidence of hepatic  steatosis with mild liver enlargement without evidence of cirrhosis, no intrahepatic/extrahepatic biliary ductal dilatation or choledocholithiasis and the gallbladder was unremarkable. Per Dr. Ira review of MRI/MRCP, gallbladder appeared to be more distended when compared to prior imaging, possible sludge or stones. LFTs downtrending.  Alk phos 241 -> 205. AST 90 -> 68. ALT 73 -> 61.  - RUQ sonogram to further evaluate the gallbladder - Hepatic panel and acute hepatitis panel added onto earlier a.m. lab draw  History of erosive esophagitis. 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  On insulin .   Carlos [name]  08/03/2024, 10:25 AM    Attending physician's note   I have taken history, reviewed the chart and examined the patient. I performed a substantive portion of this encounter, including complete performance of at least one of the key components, in conjunction with the APP. I agree with the Advanced Practitioner's note, impression and recommendations.   RUQ pain- ?GB vs PUD. CTAP with duodenitis. H/O NSAIDs.   Chronic pancreatitis (ETOH related) with PD/biliary stricture s/p multiple ERCPs. Most recent ERCP Feb 2025 s/p distal biliary sphincteroplasty, patent biliary and pancreatic sphincterotomy.  Stents removed.  MRCP from yesterday with new 2 cm PD tail pseudocyst, Nl PD. Pt with chronic epi pain.  Chronic main portal vein thrombosis with cavernous transformation. No need for [facility]   Plan: - IV Protonix  - RUQ US . If + for acute cholecystitis, Sx eval.  HIDA will not be helpful as he had sphincterotomy. - If -ve, EGD.   I  have reviewed MRCP. Concerned about GB distention with ?sludge/stones- on MR/CT. There is some pruning of distal CBD on MRCP. His LFTs esp alk phos has been trending up.  At this point, he does not need repeat ERCP/stenting. As suggested by radiology, rpt  MRCP in 4-6 months, trend LFTs as outpt. If increasing CBD distention or jaundice/fever or chills, then repeat ERCP. FU with Dr Wilhelmenia as outpt.   US  done but not reported yet EGD scheduled with Dr. Legrand for tomorrow at 9 AM Patient informed.   Anselm Bring, MD [facility] [phone]

## 2024-08-04 ENCOUNTER — Encounter (HOSPITAL_COMMUNITY): Payer: Self-pay | Admitting: Internal Medicine

## 2024-08-04 ENCOUNTER — Other Ambulatory Visit (HOSPITAL_BASED_OUTPATIENT_CLINIC_OR_DEPARTMENT_OTHER): Payer: Self-pay

## 2024-08-04 ENCOUNTER — Inpatient Hospital Stay (HOSPITAL_COMMUNITY): Payer: Self-pay | Admitting: Anesthesiology

## 2024-08-04 ENCOUNTER — Other Ambulatory Visit (HOSPITAL_COMMUNITY): Payer: Self-pay

## 2024-08-04 ENCOUNTER — Encounter (HOSPITAL_COMMUNITY)
Admission: EM | Disposition: A | Discharge status: Home / Self Care | Payer: Self-pay | Source: Home / Self Care | Attending: Internal Medicine

## 2024-08-04 DIAGNOSIS — K3189 Other diseases of stomach and duodenum: Secondary | ICD-10-CM

## 2024-08-04 DIAGNOSIS — E1010 Type 1 diabetes mellitus with ketoacidosis without coma: Secondary | ICD-10-CM | POA: Diagnosis not present

## 2024-08-04 DIAGNOSIS — K269 Duodenal ulcer, unspecified as acute or chronic, without hemorrhage or perforation: Secondary | ICD-10-CM

## 2024-08-04 DIAGNOSIS — E1165 Type 2 diabetes mellitus with hyperglycemia: Secondary | ICD-10-CM

## 2024-08-04 DIAGNOSIS — F419 Anxiety disorder, unspecified: Secondary | ICD-10-CM

## 2024-08-04 HISTORY — PX: ESOPHAGOGASTRODUODENOSCOPY: SHX5428

## 2024-08-04 LAB — BASIC METABOLIC PANEL WITH GFR
Anion gap: 10 (ref 5–15)
BUN: 7 mg/dL (ref 6–20)
CO2: 23 mmol/L (ref 22–32)
Calcium: 8.9 mg/dL (ref 8.9–10.3)
Chloride: 104 mmol/L (ref 98–111)
Creatinine, Ser: 0.7 mg/dL (ref 0.61–1.24)
GFR, Estimated: >60 mL/min (ref >60)
Glucose, Bld: 108 mg/dL — ABNORMAL HIGH (ref 70–99)
Potassium: 3.4 mmol/L — ABNORMAL LOW (ref 3.5–5.1)
Sodium: 137 mmol/L (ref 135–145)

## 2024-08-04 LAB — CBC
HCT: 30 % — ABNORMAL LOW (ref 39.0–52.0)
Hemoglobin: 9.7 g/dL — ABNORMAL LOW (ref 13.0–17.0)
MCH: 34.6 pg — ABNORMAL HIGH (ref 26.0–34.0)
MCHC: 32.3 g/dL (ref 30.0–36.0)
MCV: 107.1 fL — ABNORMAL HIGH (ref 80.0–100.0)
Platelets: 113 K/uL — ABNORMAL LOW (ref 150–400)
RBC: 2.8 MIL/uL — ABNORMAL LOW (ref 4.22–5.81)
RDW: 12.5 % (ref 11.5–15.5)
WBC: 2.7 K/uL — ABNORMAL LOW (ref 4.0–10.5)
nRBC: 0 % (ref 0.0–0.2)

## 2024-08-04 LAB — HEPATIC FUNCTION PANEL
ALT: 46 U/L — ABNORMAL HIGH (ref 0–44)
AST: 55 U/L — ABNORMAL HIGH (ref 15–41)
Albumin: 3.3 g/dL — ABNORMAL LOW (ref 3.5–5.0)
Alkaline Phosphatase: 172 U/L — ABNORMAL HIGH (ref 38–126)
Bilirubin, Direct: 0.4 mg/dL — ABNORMAL HIGH (ref 0.0–0.2)
Indirect Bilirubin: 0.4 mg/dL (ref 0.3–0.9)
Total Bilirubin: 0.8 mg/dL (ref 0.0–1.2)
Total Protein: 5.2 g/dL — ABNORMAL LOW (ref 6.5–8.1)

## 2024-08-04 LAB — GLUCOSE, CAPILLARY
Glucose-Capillary: 94 mg/dL (ref 70–99)
Glucose-Capillary: 86 mg/dL (ref 70–99)
Glucose-Capillary: 155 mg/dL — ABNORMAL HIGH (ref 70–99)

## 2024-08-04 SURGERY — EGD (ESOPHAGOGASTRODUODENOSCOPY)
Anesthesia: Monitor Anesthesia Care

## 2024-08-04 MED ORDER — LACTATED RINGERS IV SOLN: INTRAVENOUS | Status: DC | PRN

## 2024-08-04 MED ORDER — SUCRALFATE 1 GM/10ML PO SUSP
1.0000 g | Freq: Three times a day (TID) | ORAL | 0 refills | Status: DC
  Filled 2024-08-04: qty 420, 11d supply, fill #0

## 2024-08-04 MED ORDER — ACETAMINOPHEN 325 MG PO TABS: 650.0000 mg | ORAL_TABLET | Freq: Four times a day (QID) | ORAL | Status: AC | PRN

## 2024-08-04 MED ORDER — FENTANYL CITRATE (PF) 100 MCG/2ML IJ SOLN: 25.0000 ug | INTRAMUSCULAR | Status: DC | PRN

## 2024-08-04 MED ORDER — ONDANSETRON HCL 4 MG/2ML IJ SOLN: 4.0000 mg | Freq: Once | INTRAMUSCULAR | Status: DC | PRN

## 2024-08-04 MED ORDER — PANTOPRAZOLE SODIUM 40 MG PO TBEC
40.0000 mg | DELAYED_RELEASE_TABLET | Freq: Two times a day (BID) | ORAL | 0 refills | Status: DC
  Filled 2024-08-04: qty 60, 30d supply, fill #0

## 2024-08-04 MED ORDER — INSULIN PEN NEEDLE 32G X 4 MM MISC
1.0000 | Freq: Every day | 3 refills | Status: AC
  Filled 2024-08-04: qty 100, 34d supply, fill #0
  Filled 2024-09-15: qty 100, 34d supply, fill #1

## 2024-08-04 MED ORDER — POTASSIUM CHLORIDE CRYS ER 20 MEQ PO TBCR
40.0000 meq | EXTENDED_RELEASE_TABLET | Freq: Once | ORAL | Status: DC
  Filled 2024-08-04: qty 2

## 2024-08-04 MED ORDER — OXYCODONE HCL 5 MG PO TABS
5.0000 mg | ORAL_TABLET | ORAL | 0 refills | Status: DC | PRN
  Filled 2024-08-04: qty 30, 5d supply, fill #0

## 2024-08-04 MED ORDER — INSULIN GLARGINE 100 UNIT/ML SOLOSTAR PEN
12.0000 [IU] | PEN_INJECTOR | SUBCUTANEOUS | 11 refills | Status: AC
  Filled 2024-08-04 (×2): qty 9, 75d supply, fill #0
  Filled 2024-08-04: qty 10, 83d supply, fill #0

## 2024-08-04 MED ORDER — PROPOFOL 10 MG/ML IV BOLUS
INTRAVENOUS | Status: DC | PRN
  Administered 2024-08-04: 30 mg via INTRAVENOUS
  Administered 2024-08-04: 50 mg via INTRAVENOUS

## 2024-08-04 MED ORDER — PANTOPRAZOLE SODIUM 40 MG PO TBEC: 40.0000 mg | DELAYED_RELEASE_TABLET | Freq: Two times a day (BID) | ORAL | Status: DC

## 2024-08-04 MED ORDER — PROPOFOL 500 MG/50ML IV EMUL
INTRAVENOUS | Status: DC | PRN
  Administered 2024-08-04: 180 ug/kg/min via INTRAVENOUS

## 2024-08-04 MED ORDER — POTASSIUM CHLORIDE CRYS ER 20 MEQ PO TBCR
40.0000 meq | EXTENDED_RELEASE_TABLET | Freq: Once | ORAL | Status: AC
  Administered 2024-08-04: 40 meq via ORAL

## 2024-08-04 NOTE — Transfer of Care (Signed)
 Immediate Anesthesia Transfer of Care Note  Patient: Carlos [name]  Procedure(s) Performed: EGD (ESOPHAGOGASTRODUODENOSCOPY)  Patient Location: PACU  Anesthesia Type:MAC  Level of Consciousness: drowsy and patient cooperative  Airway & Oxygen Therapy: Patient Spontanous Breathing  Post-op Assessment: Report given to RN and Post -op Vital signs reviewed and stable  Post vital signs: Reviewed and stable  Last Vitals:  Vitals Value Taken Time  BP 102/70 08/04/24 09:11  Temp    Pulse 57 08/04/24 09:12  Resp 17 08/04/24 09:12  SpO2 100 % 08/04/24 09:12  Vitals shown include unfiled device data.  Last Pain:  Vitals:   08/04/24 0839  TempSrc: Temporal  PainSc: 7          Complications: No notable events documented.

## 2024-08-04 NOTE — Discharge Summary (Signed)
 Physician Discharge Summary   Patient: Carlos [name] MRN: [id number] DOB: Mar 14, 1986  Admit date:     08/01/2024  Discharge date: 08/04/24  Discharge Physician: Carlos [name]   PCP: Carlos Roxan BROCKS, NP   Recommendations at discharge:   You appear to need cholecystectomy (surgery to remove your gallbladder) and are being referred to [facility] surgery for this issue Low fat diet is recommended Stop omeprazole  and take pantoprazole  twice daily plus Carafate  Resume Lantus  (long-acting insulin ) 12 units daily and continue with sliding scale as per prior instructions You are being prescribed a limited number of narcotic pain pills; take as directed and do not drive or make important decisions while taking this medication  Follow up with NP [name] in 1-2 weeks Follow up with GI, call for an appointment  Discharge Diagnoses: Principal Problem:   DKA (diabetic ketoacidosis) (HCC) Active Problems:   Uncontrolled type 1 diabetes mellitus with hyperglycemia, with long-term current use of insulin  (HCC)   Portal vein thrombosis   Transaminitis   RUQ pain   Chronic pancreatitis (HCC)   Chronic generalized abdominal pain   Abnormal finding on GI tract imaging   Calculus of gallbladder without cholecystitis without obstruction    Hospital Course: 38yo with h/o anxiety, T1DM, HTN, diabetic gastroparesis, and pancreatitis who presented on 9/24 with abdominal pain and was found to be in DKA.  He has chronic pain and ran out of chronic pain medications, unable to get into pain clinic.  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  GI is consulting, RUQ US  pending, EGD tentatively planned for 9/27.  Assessment and Plan:  Type 1 DM with DKA Admitted to stepdown unit Uncertain etiology - denies medication changes or noncompliance,  dehydration, recent infection Treated with insulin  drip -> transition order set On most recent labs, gap is closed, acidosis resolved Currently on Lantus  12 units daily, very sensitive scale SSI with mealtime and at bedtime coverage   Progressive RUQ/epigastric pain with mildly abnormal LFTs Similar symptoms of prior pancreatic flare  CT A/P without apparent acute pancreatitis, ?duodenitis MRCP without acute pancreatitis PPI and carafate  trial without significant improvement Mildly elevated LFTs GI consulting RUQ US  with cholelithiasis/sludge and increased gallbladder wall thickness; also with concern for hepatic steatosis EGD on 9/27 with duodenal erosions without bleeding, biopsied ?need for future EUS Discussed concern for cholelithiasis with surgery, Dr. [name] The patient has an elevated risk given his portal venous HTN and chronic portal vein occlusion He is recommended to f/u as an outpatient at [facility] for consideration of cholecystectomy Low fat diet for now OK for discharge per GI and surgery Resume Creon  Change omeprazole  daily to pantoprazole  BID GI added Carafate    Elevated LFTS with cholestatic picture Alt 73, AST 90 , Alphos 241  Known history of prior biliary stents Possible obstruction as cause of pain  MRCP ordered and showed 2 x 2 cm cystic lesion in pancreatic tail - pseduocyst vs. 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    Chronic pain  Awaiting pain  clinic referral  Previously on PO Dilaudid , currently on oxy He is receiving 60 pills per month by his PCP but reports that this is only a 10 day supply and he is having to  space them out to last all month We discussed that opiates are not recommended as chronic pancreatitis pain management Will continue to consider potential causes of pain and other options for treatment Provided #30 oxy 5 mg tabs at time of dc Continue gabapentin            Consultants: GI Surgery (telephone only) DM coordinator   Procedures: EGD 9/27   Antibiotics: None  Pain control - [place]  Controlled Substance Reporting System database was reviewed. and patient was instructed, not to drive, operate heavy machinery, perform activities at heights, swimming or participation in water activities or provide baby-sitting services while on Pain, Sleep and Anxiety Medications; until their outpatient Physician has advised to do so again. Also recommended to not to take more than prescribed Pain, Sleep and Anxiety Medications.   Disposition: Home Diet recommendation:  Carb modified diet, low fat DISCHARGE MEDICATION: Allergies as of 08/04/2024       Reactions   Other Anaphylaxis, Other (See Comments)   [name] - anaphylaxis        Medication List     STOP taking these medications    Lantus  SoloStar 100 UNIT/ML Solostar Pen Generic drug: insulin  glargine Replaced by: insulin  glargine 100 UNIT/ML injection   omeprazole  40 MG capsule Commonly known as: PRILOSEC       TAKE these medications    acetaminophen  325 MG tablet Commonly known as: TYLENOL  Take 2 tablets (650 mg total) by mouth every 6 (six) hours as needed for mild pain (pain score 1-3) or fever (or Fever >/= 101).   carvedilol  12.5 MG tablet Commonly known as: COREG  Take 1 tablet (12.5 mg total) by mouth 2 (two) times daily with a meal.   Dexcom G7 Receiver Devi Use as directed.   Dexcom G7 Sensor Misc Use as directed and change every 10 days.   gabapentin  100 MG capsule Commonly known as: Neurontin  Take 1 capsule (100 mg total) by mouth 2 (two) times daily. What changed:  when to take  this reasons to take this   insulin  glargine 100 UNIT/ML injection Commonly known as: LANTUS  Inject 0.12 mLs (12 Units total) into the skin daily. Replaces: Lantus  SoloStar 100 UNIT/ML Solostar Pen   Insulin  Pen Needle 32G X 4 MM Misc 1 Device by Does not apply route in the morning, at noon, in the evening, and at bedtime.   lipase/protease/amylase 63999 UNITS Cpep capsule Commonly known as: Creon  Take 1-2 capsules (36,000-72,000 Units total) by mouth See admin instructions. Take 72,000 units by mouth three times a day before meals and 36,000 units with any snacks   naloxone  4 MG/0.1ML Liqd nasal spray kit Commonly known as: NARCAN  Place 1 spray into the nose once as needed (Overdose).   NovoLOG  FlexPen 100 UNIT/ML FlexPen Generic drug: insulin  aspart Max daily 30 units   oxyCODONE  5 MG immediate release tablet Commonly known as: Oxy IR/ROXICODONE  Take 1 tablet (5 mg total) by mouth every 4 (four) hours as needed for up to 5 days for severe pain (pain score 7-10).   pantoprazole  40 MG tablet Commonly known as: PROTONIX  Take 1 tablet (40 mg total) by mouth 2 (two) times daily.   potassium citrate  10 MEQ (1080 MG) SR tablet Commonly known as: UROCIT-K  Take 1 tablet (10 mEq total) by  mouth 2 (two) times daily after a meal.   sucralfate  1 GM/10ML suspension Commonly known as: CARAFATE  Take 10 mLs (1 g total) by mouth 4 (four) times daily -  with meals and at bedtime.   Therems Tabs Take 1 tablet by mouth daily with breakfast.        Discharge Exam:    Subjective: Feeling ok post-EGD.  Still some RUQ pain.     Objective: Vitals:   08/04/24 0940 08/04/24 1253  BP: 100/70 105/72  Pulse: (!) 59 60  Resp: 12 20  Temp: 98 F (36.7 C) 98 F (36.7 C)  SpO2: 100% 100%    Intake/Output Summary (Last 24 hours) at 08/04/2024 1301 Last data filed at 08/04/2024 0907 Gross per 24 hour  Intake 1001.82 ml  Output --  Net 1001.82 ml   Filed Weights   08/02/24 0400   Weight: 61.7 kg    Exam:  General:  Appears calm and comfortable and is in NAD Eyes:  normal lids, iris ENT:  grossly normal hearing, lips & tongue, mmm Cardiovascular:  RRR. No LE edema.  Respiratory:   CTA bilaterally with no wheezes/rales/rhonchi.  Normal respiratory effort. Abdomen:  soft, RUQ TTP, ND Skin:  no rash or induration seen on limited exam Musculoskeletal:  grossly normal tone BUE/BLE, good ROM, no bony abnormality Psychiatric:  grossly normal mood and affect, speech fluent and appropriate, AOx3 Neurologic:  CN 2-12 grossly intact, moves all extremities in coordinated fashion  Data Reviewed: I have reviewed the patient's lab results since admission.  Pertinent labs for today include:   K+ 3.4 Glucose 109 AP 172 Albumin  3.3 AST 55/ALT 46 WBC 2.7 Hgb 9.7 Platelets 113 Acute hepatitis panel negative    Condition at discharge: stable  The results of significant diagnostics from this hospitalization (including imaging, microbiology, ancillary and laboratory) are listed below for reference.   Imaging Studies: US  Abdomen Limited RUQ (LIVER/GB) Result Date: 08/03/2024 CLINICAL DATA:  Abdominal pain EXAM: ULTRASOUND ABDOMEN LIMITED RIGHT UPPER QUADRANT COMPARISON:  MRI abdomen August 02, 2024 FINDINGS: Gallbladder/CBD: Thick-walled gallbladder (6 mm). Intraluminal layering stones/sludge is present. Common bile duct is dilated up to 6.6 mm. Negative sonographic Murphy's sign. Liver: Heterogeneous increased echogenicity of the liver. No focal liver lesion. No intrahepatic biliary dilatation. At the region of porta hepatis there is suggestion of serpiginous venous structures consistent with cavernous transformation of portal vein better assessed on prior CT. Normal flow direction. IMPRESSION: Cholelithiasis/gallbladder sludge and increased gallbladder wall thickness. Heterogeneous and increased hepatic echogenicity which can be seen the setting of hepatocellular disease  or hepatic steatosis. No focal liver lesion. Cavernous transformation of portal vein. Electronically Signed   By: Megan  Zare M.D.   On: 08/03/2024 16:29   MR ABDOMEN MRCP W WO CONTAST Result Date: 08/02/2024 CLINICAL DATA:  Hepatitis, acute (Ped 0-17y). EXAM: MRI ABDOMEN WITHOUT AND WITH CONTRAST (INCLUDING MRCP) TECHNIQUE: Multiplanar multisequence MR imaging of the abdomen was performed both before and after the administration of intravenous contrast. Heavily T2-weighted images of the biliary and pancreatic ducts were obtained, and three-dimensional MRCP images were rendered by post processing. CONTRAST:  6mL GADAVIST  GADOBUTROL  1 MMOL/ML IV SOLN COMPARISON:  CT scan abdomen and pelvis from earlier the same day and MRI abdomen from 04/26/2023. FINDINGS: Lower chest: Unremarkable MR appearance to the lung bases. No pleural effusion. No pericardial effusion. Normal heart size. Hepatobiliary: The liver is mildly enlarged. Noncirrhotic configuration. No focal lesion. There is an approximately 4.5 x 6.0 cm geographic area  in the right hepatic lobe, segments 5/6 which exhibit marked loss of signal on out of phase images, compatible with focal hepatic steatosis. 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 No intrahepatic or extrahepatic bile duct dilatation. No choledocholithiasis. Unremarkable gallbladder. Pancreas: There is a well-circumscribed T2 hyperintense nonenhancing 2.0 x 2.0 cm structure in the pancreatic tail (series 10, image 17). No communication with the pancreatic side-branch seen on the provided images. The lesion exhibits thin enhancing walls. This is new since the prior MRI abdomen from 04/26/2023. Differential diagnosis includes intrapancreatic pseudocyst versus cystic pancreatic lesion. Main pancreatic duct is not dilated. No peripancreatic fat stranding. Spleen:  Within normal limits in size and appearance. No focal mass. Adrenals/Urinary Tract:  Unremarkable adrenal glands. No hydroureteronephrosis. No suspicious renal mass. Stomach/Bowel: Visualized portions within the abdomen are unremarkable. No disproportionate dilation of bowel loops. Vascular/Lymphatic: No pathologically enlarged lymph nodes identified. No abdominal aortic aneurysm demonstrated. No ascites. Other:  None. Musculoskeletal: No suspicious bone lesions identified. IMPRESSION: 1. There is a 2.0 x 2.0 cm cystic lesion in the pancreatic tail, as described above. Differential diagnosis includes intrapancreatic pseudocyst versus cystic pancreatic lesion. Correlate clinically to determine the need for short-term follow-up with MRI abdomen in 3-6 months. 2. There is an approximately 4.5 x 6.0 cm geographic area in the right hepatic lobe, segments 5/6 which exhibit marked loss of signal on out of phase images, compatible with focal hepatic steatosis. 3. Multiple other nonacute observations, as described above. Electronically Signed   By: Ree Molt M.D.   On: 08/02/2024 15:08   MR 3D Recon At Scanner Result Date: 08/02/2024 CLINICAL DATA:  Hepatitis, acute (Ped 0-17y). EXAM: MRI ABDOMEN WITHOUT AND WITH CONTRAST (INCLUDING MRCP) TECHNIQUE: Multiplanar multisequence MR imaging of the abdomen was performed both before and after the administration of intravenous contrast. Heavily T2-weighted images of the biliary and pancreatic ducts were obtained, and three-dimensional MRCP images were rendered by post processing. CONTRAST:  6mL GADAVIST  GADOBUTROL  1 MMOL/ML IV SOLN COMPARISON:  CT scan abdomen and pelvis from earlier the same day and MRI abdomen from 04/26/2023. FINDINGS: Lower chest: Unremarkable MR appearance to the lung bases. No pleural effusion. No pericardial effusion. Normal heart size. Hepatobiliary: The liver is mildly enlarged. Noncirrhotic configuration. No focal lesion. There is an approximately 4.5 x 6.0 cm geographic area in the right hepatic lobe, segments 5/6 which exhibit  marked loss of signal on out of phase images, compatible with focal hepatic steatosis. 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 No intrahepatic or extrahepatic bile duct dilatation. No choledocholithiasis. Unremarkable gallbladder. Pancreas: There is a well-circumscribed T2 hyperintense nonenhancing 2.0 x 2.0 cm structure in the pancreatic tail (series 10, image 17). No communication with the pancreatic side-branch seen on the provided images. The lesion exhibits thin enhancing walls. This is new since the prior MRI abdomen from 04/26/2023. Differential diagnosis includes intrapancreatic pseudocyst versus cystic pancreatic lesion. Main pancreatic duct is not dilated. No peripancreatic fat stranding. Spleen:  Within normal limits in size and appearance. No focal mass. Adrenals/Urinary Tract: Unremarkable adrenal glands. No hydroureteronephrosis. No suspicious renal mass. Stomach/Bowel: Visualized portions within the abdomen are unremarkable. No disproportionate dilation of bowel loops. Vascular/Lymphatic: No pathologically enlarged lymph nodes identified. No abdominal aortic aneurysm demonstrated. No ascites. Other:  None. Musculoskeletal: No suspicious bone lesions identified. IMPRESSION: 1. There is a 2.0 x 2.0 cm cystic lesion in the pancreatic tail, as described above. Differential diagnosis includes intrapancreatic pseudocyst versus cystic pancreatic lesion.  Correlate clinically to determine the need for short-term follow-up with MRI abdomen in 3-6 months. 2. There is an approximately 4.5 x 6.0 cm geographic area in the right hepatic lobe, segments 5/6 which exhibit marked loss of signal on out of phase images, compatible with focal hepatic steatosis. 3. Multiple other nonacute observations, as described above. Electronically Signed   By: Ree Molt M.D.   On: 08/02/2024 15:08   CT ABDOMEN PELVIS W CONTRAST Result Date: 08/02/2024 EXAM: CTA CHEST PE WITH  CONTRAST CT ABDOMEN AND PELVIS WITH CONTRAST 08/02/2024 03:44:27 AM TECHNIQUE: CTA of the chest was performed after the administration of 100 mL of iohexol  (OMNIPAQUE ) 350 MG/ML injection. Multiplanar reformatted images are provided for review. MIP images are provided for review. CT of the abdomen and pelvis was performed with the administration of intravenous contrast. Automated exposure control, iterative reconstruction, and/or weight based adjustment of the mA/kV was utilized to reduce the radiation dose to as low as reasonably achievable. COMPARISON: CT abdomen / pelvis dated 03/28/2024. CLINICAL HISTORY: Pancreatitis suspected. Chest pain x3 weeks. Reports worse today. Reports pain in chest is similar to a pancreatitis flare he had previously, wbc's 5.3, GFR>60; 100 ml omni 350. FINDINGS: CHEST: PULMONARY ARTERIES: Pulmonary arteries are adequately opacified for evaluation. No intraluminal filling defect to suggest pulmonary embolism. Main pulmonary artery is normal in caliber. MEDIASTINUM: No mediastinal lymphadenopathy. The heart and pericardium demonstrate no acute abnormality. There is no acute abnormality of the thoracic aorta. LUNGS AND PLEURA: The lungs are without acute process. No focal consolidation or pulmonary edema. No pleural effusion or pneumothorax. SOFT TISSUES AND BONES: No acute bone or soft tissue abnormality. ABDOMEN AND PELVIS: LIVER: The liver is unremarkable. GALLBLADDER AND BILE DUCTS: Gallbladder is unremarkable. No biliary ductal dilatation. SPLEEN: Spleen demonstrates no acute abnormality. PANCREAS: Coarse parenchymal calcifications with atrophy along the pancreas, reflecting sequela of prior / chronic pancreatitis. 2.1 cm pseudocyst along the pancreatic tail (image 18), similar. ADRENAL GLANDS: Adrenal glands demonstrate no acute abnormality. KIDNEYS, URETERS AND BLADDER: No stones in the kidneys or ureters. No hydronephrosis. No perinephric or periureteral stranding. Urinary  bladder is moderately distended. GI AND BOWEL: Mild wall thickening along the proximal duodenum (image 29), correlate for duodenitis. There is no bowel obstruction. No abnormal bowel wall thickening or distension. REPRODUCTIVE: Reproductive organs are unremarkable. PERITONEUM AND RETROPERITONEUM: No ascites or free air. LYMPH NODES: No lymphadenopathy. BONES AND SOFT TISSUES: No acute abnormality of the visualized bones. No focal soft tissue abnormality. 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 IMPRESSION: 1. No evidence of pulmonary embolism. Negative CT chest. 2. Chronic pancreatitis with 2.1 cm pancreatic tail pseudocyst, similar. No acute inflammatory changes. 3. Mild proximal duodenal wall thickening, suggestive of duodenitis. 4. 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 Electronically signed by: Pinkie Pebbles MD 08/02/2024 03:51 AM EDT RP Workstation: [id number]   CT Angio Chest PE W and/or Wo Contrast Result Date: 08/02/2024 EXAM: CTA CHEST PE WITH CONTRAST CT ABDOMEN AND PELVIS WITH CONTRAST 08/02/2024 03:44:27 AM TECHNIQUE: CTA of the chest was performed after the administration of 100 mL of iohexol  (OMNIPAQUE ) 350 MG/ML injection. Multiplanar reformatted images are provided for review. MIP images are provided for review. CT of the abdomen and pelvis was performed with the administration of intravenous contrast. Automated exposure control, iterative reconstruction, and/or weight based adjustment of the mA/kV was utilized to reduce the radiation dose to as low as reasonably achievable. COMPARISON: CT abdomen / pelvis dated 03/28/2024. CLINICAL HISTORY: Pancreatitis suspected. Chest  pain x3 weeks. Reports worse today. Reports pain in chest is similar to a pancreatitis flare he had previously, wbc's 5.3, GFR>60; 100 ml omni 350. FINDINGS: CHEST: PULMONARY ARTERIES: Pulmonary arteries are adequately opacified for evaluation. No intraluminal filling  defect to suggest pulmonary embolism. Main pulmonary artery is normal in caliber. MEDIASTINUM: No mediastinal lymphadenopathy. The heart and pericardium demonstrate no acute abnormality. There is no acute abnormality of the thoracic aorta. LUNGS AND PLEURA: The lungs are without acute process. No focal consolidation or pulmonary edema. No pleural effusion or pneumothorax. SOFT TISSUES AND BONES: No acute bone or soft tissue abnormality. ABDOMEN AND PELVIS: LIVER: The liver is unremarkable. GALLBLADDER AND BILE DUCTS: Gallbladder is unremarkable. No biliary ductal dilatation. SPLEEN: Spleen demonstrates no acute abnormality. PANCREAS: Coarse parenchymal calcifications with atrophy along the pancreas, reflecting sequela of prior / chronic pancreatitis. 2.1 cm pseudocyst along the pancreatic tail (image 18), similar. ADRENAL GLANDS: Adrenal glands demonstrate no acute abnormality. KIDNEYS, URETERS AND BLADDER: No stones in the kidneys or ureters. No hydronephrosis. No perinephric or periureteral stranding. Urinary bladder is moderately distended. GI AND BOWEL: Mild wall thickening along the proximal duodenum (image 29), correlate for duodenitis. There is no bowel obstruction. No abnormal bowel wall thickening or distension. REPRODUCTIVE: Reproductive organs are unremarkable. PERITONEUM AND RETROPERITONEUM: No ascites or free air. LYMPH NODES: No lymphadenopathy. BONES AND SOFT TISSUES: No acute abnormality of the visualized bones. No focal soft tissue abnormality. 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 IMPRESSION: 1. No evidence of pulmonary embolism. Negative CT chest. 2. Chronic pancreatitis with 2.1 cm pancreatic tail pseudocyst, similar. No acute inflammatory changes. 3. Mild proximal duodenal wall thickening, suggestive of duodenitis. 4. 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 Electronically signed by: Pinkie Pebbles MD 08/02/2024 03:51 AM EDT RP  Workstation: [id number]   DG Chest 2 View Result Date: 08/01/2024 EXAM: 2 VIEW(S) XRAY OF THE CHEST 08/01/2024 11:54:22 PM COMPARISON: 03/27/2024 CLINICAL HISTORY: CP. Pancreatitis, upper abdominal and chest pain FINDINGS: LUNGS AND PLEURA: No focal pulmonary opacity. No pulmonary edema. No pleural effusion. No pneumothorax. HEART AND MEDIASTINUM: No acute abnormality of the cardiac and mediastinal silhouettes. BONES AND SOFT TISSUES: No acute osseous abnormality. IMPRESSION: 1. No acute abnormalities. Electronically signed by: Pinkie Pebbles MD 08/01/2024 11:57 PM EDT RP Workstation: [id number]    Microbiology: Results for orders placed or performed during the hospital encounter of 08/01/24  MRSA Next Gen by PCR, Nasal     Status: None   Collection Time: 08/02/24  5:25 AM   Specimen: Nasal Mucosa; Nasal Swab  Result Value Ref Range Status   MRSA by PCR Next Gen NOT DETECTED NOT DETECTED Final    Comment: (NOTE) The GeneXpert MRSA Assay (FDA approved for NASAL specimens only), is one component of a comprehensive MRSA colonization surveillance program. It is not intended to diagnose MRSA infection nor to guide or monitor treatment for MRSA infections. Test performance is not FDA approved in patients less than 16 years old. Performed at [facility], [address] [place], [place] [address]     Labs: CBC: Recent Labs  Lab 08/01/24 2348 08/02/24 0305 08/02/24 0426 08/04/24 0612  WBC 5.3 5.5 6.1 2.7*  HGB 13.3 12.2* 11.4* 9.7*  HCT 39.1 37.7* 35.6* 30.0*  MCV 107.1* 106.8* 108.2* 107.1*  PLT 175 154 135* 113*   Basic Metabolic Panel: Recent Labs  Lab 08/02/24 0305 08/02/24 0426 08/02/24 0847 08/02/24 1224 08/03/24 0315 08/04/24 0612  NA 135 134* 134* 134* 135  137  K 3.7 3.7 3.4* 4.0 3.7 3.4*  CL 94* 98 103 101 103 104  CO2 13* 14* 17* 22 22 23   GLUCOSE 196* 117* 127* 133* 265* 108*  BUN <5* <5* <5* <5* 6 7  CREATININE 0.78 0.70 0.65 0.71 0.84  0.70  CALCIUM  9.6 9.0 8.7* 8.9 8.7* 8.9  MG 1.8  --   --   --   --   --   PHOS 2.7  --   --   --   --   --    Liver Function Tests: Recent Labs  Lab 08/01/24 2348 08/02/24 0426 08/03/24 1027 08/04/24 0612  AST 90* 68* 61* 55*  ALT 73* 61* 45* 46*  ALKPHOS 241* 205* 166* 172*  BILITOT 0.6 0.6 0.8 0.8  PROT 7.7 6.5 4.8* 5.2*  ALBUMIN  4.7 4.1 3.3* 3.3*   CBG: Recent Labs  Lab 08/03/24 1805 08/03/24 2051 08/04/24 0743 08/04/24 0914 08/04/24 1219  GLUCAP 249* 256* 94 86 155*    Discharge time spent: greater than 30 minutes.  Signed: Delon Herald, MD [facility] 08/04/2024

## 2024-08-04 NOTE — Anesthesia Postprocedure Evaluation (Signed)
 Anesthesia Post Note  Patient: Carlos [name]  Procedure(s) Performed: EGD (ESOPHAGOGASTRODUODENOSCOPY)     Patient location during evaluation: PACU Anesthesia Type: MAC Level of consciousness: awake and alert and oriented Pain management: pain level controlled Vital Signs Assessment: post-procedure vital signs reviewed and stable Respiratory status: spontaneous breathing, nonlabored ventilation and respiratory function stable Cardiovascular status: stable and blood pressure returned to baseline Postop Assessment: no apparent nausea or vomiting Anesthetic complications: no   No notable events documented.  Last Vitals:  Vitals:   08/04/24 0914 08/04/24 0915  BP: 103/70 103/70  Pulse: (!) 58 (!) 58  Resp: 14 15  Temp:    SpO2: 100% 100%    Last Pain:  Vitals:   08/04/24 0911  TempSrc:   PainSc: Asleep                 Daisey Caloca A.

## 2024-08-04 NOTE — Interval H&P Note (Signed)
 History and Physical Interval Note:  08/04/2024 8:35 AM  Carlos [name]  has presented today for surgery, with the diagnosis of Right upper abdominal pain.  The various methods of treatment have been discussed with the patient and family. After consideration of risks, benefits and other options for treatment, the patient has consented to  Procedure(s): EGD (ESOPHAGOGASTRODUODENOSCOPY) (N/A) as a surgical intervention.  The patient's history has been reviewed, patient examined, no change in status, stable for surgery.  I have reviewed the patient's chart and labs.  Questions were answered to the patient's satisfaction.    Signout rec'd from Dr Charlanne and patient interviewed/examined in endoscopy pre-procedure area. Still has RUQ pain US  showed gallstones with GB wall thickening, no surrounding fluid or sonographic murphy's sign  Plan for EGD today to evaluate for possible PUD disease with NSAID use prior to surgical consult.  Patient agreeable after discussion of procedure and risks.   The benefits and risks of the planned procedure(s) were described in detail with the patient or (when appropriate) their health care proxy.  Risks were outlined as including, but not limited to, bleeding, infection, perforation, adverse medication reaction leading to cardiac or pulmonary decompensation, pancreatitis (if ERCP).  The limitation of incomplete mucosal visualization was also discussed.  No guarantees or warranties were given.   Victory LITTIE Brand III

## 2024-08-04 NOTE — Op Note (Signed)
 [facility] Patient Name: Carlos [name] Procedure Date: 08/04/2024 MRN: [id number] Attending MD: Victory CROME. Legrand , MD, [id number] Date of Birth: 05/19/1986 CSN: [id number] Age: 38 Admit Type: Inpatient Procedure:                Upper GI endoscopy Indications:              Abdominal pain in the right upper quadrant,                            Abnormal CT of the GI tract (CTAP on admission                            suggesting duodenitis)-chronic NSAID use                           See inpatient consult note for details                           Complications of chronic pancreatitis                           Escalating right upper quadrant pain for weeks                            -ultrasound showed gallstones with wall thickening,                            no surrounding fluid Providers:                Victory L. Legrand, MD, Robie Breed, RN,                            [facility], Technician, Hoy Sharps, CRNA Referring MD:             [facility] hospitalist Medicines:                Monitored Anesthesia Care Complications:            No immediate complications. Estimated Blood Loss:     Estimated blood loss was minimal. Procedure:                Pre-Anesthesia Assessment:                           - Prior to the procedure, a History and Physical                            was performed, and patient medications and                            allergies were reviewed. The patient's tolerance of                            previous anesthesia was also reviewed. The risks                            and benefits of the  procedure and the sedation                            options and risks were discussed with the patient.                            All questions were answered, and informed consent                            was obtained. Prior Anticoagulants: The patient has                            taken no anticoagulant or antiplatelet agents. ASA                             Grade Assessment: III - A patient with severe                            systemic disease. After reviewing the risks and                            benefits, the patient was deemed in satisfactory                            condition to undergo the procedure.                           After obtaining informed consent, the endoscope was                            passed under direct vision. Throughout the                            procedure, the patient's blood pressure, pulse, and                            oxygen saturations were monitored continuously. The                            [id number]-H190 ([id number] Olympus endoscope was introduced                            through the mouth, and advanced to the second part                            of duodenum. The upper GI endoscopy was                            accomplished without difficulty. The patient                            tolerated the procedure well. Scope In: Scope Out: Findings:      The esophagus was normal.      Normal mucosa was found in  the entire examined stomach. Biopsies were       taken with a cold forceps for histology. (Antrum and body in same       pathology jar to rule out H. pylori due to duodenal findings)      The cardia and gastric fundus were normal on retroflexion. The stomach       distended well with insufflation.      A few erosions without bleeding were found in the duodenal bulb. There       was also some mild nonspecific mucosal edema in the bulb and proximal       sweep. The duodenum was otherwise normal. Impression:               - Normal esophagus.                           - Normal mucosa was found in the entire stomach.                            Biopsied.                           - Duodenal erosions without bleeding.                           The endoscopic findings do not explain this                            patient's pain, which is suspected to be from                             cholelithiasis. Moderate Sedation:      MAC sedation used Recommendation:           - Return patient to hospital ward for ongoing care.                           - Low fat diet.                           - Follow-up biopsy results                           - Surgical consultation will be requested Procedure Code(s):        --- Professional ---                           [phone], Esophagogastroduodenoscopy, flexible,                            transoral; with biopsy, single or multiple Diagnosis Code(s):        --- Professional ---                           K26.9, Duodenal ulcer, unspecified as acute or                            chronic, without hemorrhage or perforation  R10.11, Right upper quadrant pain                           R93.3, Abnormal findings on diagnostic imaging of                            other parts of digestive tract CPT copyright 2022 [employer]. All rights reserved. The codes documented in this report are preliminary and upon coder review may  be revised to meet current compliance requirements. Milanni Ayub L. Legrand, MD 08/04/2024 9:06:36 AM This report has been signed electronically. Number of Addenda: 0

## 2024-08-06 ENCOUNTER — Telehealth: Payer: Self-pay

## 2024-08-06 ENCOUNTER — Encounter (HOSPITAL_COMMUNITY): Payer: Self-pay | Admitting: Gastroenterology

## 2024-08-06 NOTE — Transitions of Care (Post Inpatient/ED Visit) (Signed)
   08/06/2024  Name: Carlos [name] MRN: [id number] DOB: 1986/05/07  Today's TOC FU Call Status: Today's TOC FU Call Status:: Unsuccessful Call (1st Attempt) Unsuccessful Call (1st Attempt) Date: 08/06/24  Attempted to reach the patient regarding the most recent Inpatient/ED visit.  Follow Up Plan: Additional outreach attempts will be made to reach the patient to complete the Transitions of Care (Post Inpatient/ED visit) call.   Alan Ee, RN, BSN, CEN [employer]- Transition of Care Team.  [facility] [phone]

## 2024-08-07 ENCOUNTER — Telehealth: Payer: Self-pay

## 2024-08-07 LAB — SURGICAL PATHOLOGY

## 2024-08-07 NOTE — Patient Instructions (Signed)
 Visit Information  Thank you for taking time to visit with me today. Please don't hesitate to contact me if I can be of assistance to you before our next scheduled telephone appointment.  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 associated with type 1 diabetes mellitus No Hospital Follow Up Provider appointment Patient did not have hospital follow up appointments scheduled with PCP or GI - Conference calls were made with patient to both and appointments were scheduled  Goal: Closing TOC Program - Unable to mark goal met related to unable to reach patient for follow up x 3 Over the next 30 days, the patient will not experience hospital readmission  Interventions:  Transitions of Care: Doctor Visits  - discussed the importance of doctor visits Arranged PCP follow-up within 7 days (Care Guide Scheduled) Reviewed with patient plan for cholecystectomy - Patient states he is to call [facility] surgery and states he has the number to call and states he has a f/u with his GI, Dr Wilhelmenia but didn't have the date in front of him   Diabetes Interventions: Assessed patient's understanding of A1c goal: <6.5% Reviewed medications with patient and discussed importance of medication adherence Discussed plans with patient for ongoing care management follow up and provided patient with direct contact information for care management team Reviewed scheduled/upcoming provider appointments including: PCP appointment scheduled by [facility] RN for 04/04/24 and GI, Dr Wilhelmenia 04/26/24 Lab Results  Component Value Date   HGBA1C 7.9 (A) 09/19/2023    Patient Self Care Activities:  Attend all scheduled provider appointments Call pharmacy for medication  refills 3-7 days in advance of running out of medications Call provider office for new concerns or questions  Notify RN Care Manager of TOC call rescheduling needs Participate in Transition of Care Program/Attend TOC scheduled calls Take medications as prescribed   take the blood sugar log to all doctor visits drink 6 to 8 glasses of water each day  Plan: Closing TOC Program - Unable to mark goal met related to unable to reach patient for follow up x 3      VBCI Transitions of Care (TOC) Care Plan       Problems:  Recent Hospitalization for treatment of Diabetic ketoacidosis (DKA) Patient has been unable to get an appointment with [facility]   Goal:  Over the next 30 days, the patient will not experience hospital readmission  Interventions:  Transitions of Care: Doctor Visits  - discussed the importance of doctor visits  Diabetes Interventions: Assessed patient's understanding of A1c goal: <7% Provided education to patient about basic DM disease process Reviewed medications with patient and discussed importance of medication adherence Discussed plans with patient for ongoing care management follow up and provided patient with direct contact information for care management team Reviewed scheduled/upcoming provider appointments including: PCP appointment 08/08/24  Assessed social determinant of health barriers Lab Results  Component Value Date   HGBA1C 9.3 (H) 08/02/2024    Pain Interventions: Pain assessment performed Medications reviewed Discussed importance of adherence to all scheduled medical appointments Counseled on the importance of reporting any/all new or changed pain symptoms or management strategies to  pain management provider Advised patient to report to care team affect of pain on daily activities Patient reported he was referred to [facility] months ago and he has called but does not get a call back - TOC RN called and spoke with [place] in  conference with patient and appointment has now been scheduled for this Friday 08/10/24  Patient Self Care Activities:  Attend all scheduled provider appointments Call pharmacy for medication refills 3-7 days in advance of running out of medications Call provider office for new concerns or questions  Notify RN Care Manager of TOC call rescheduling needs Participate in Transition of Care Program/Attend TOC scheduled calls Take medications as prescribed   check blood sugar at prescribed times: Monitor via Dexcom check feet daily for cuts, sores or redness take the blood sugar log to all doctor visits manage portion size wash and dry feet carefully every day wear comfortable, cotton socks wear comfortable, well-fitting shoes  Plan:  Telephone follow up appointment with care management team member scheduled for:  08/15/24 in the afternoon The patient has been provided with contact information for the care management team and has been advised to call with any health related questions or concerns.         Patient verbalizes understanding of instructions and care plan provided today and agrees to view in MyChart. Active MyChart status and patient understanding of how to access instructions and care plan via MyChart confirmed with patient.     Telephone follow up appointment with care management team member scheduled for:08/15/24 The patient has been provided with contact information for the care management team and has been advised to call with any health related questions or concerns.   Please call the care guide team at [phone] if you need to cancel or reschedule your appointment.   Please call the Suicide and Crisis Lifeline: 988 call 1-800-273-TALK (toll free, 24 hour hotline) call 911 if you are experiencing a Mental Health or Behavioral Health Crisis or need someone to talk to.  Shona Prow RN, CCM [facility]  VBC[facility]-Population Health RN Care Manager [phone]

## 2024-08-07 NOTE — Transitions of Care (Post Inpatient/ED Visit) (Signed)
 08/07/2024  Name: Carlos [name] MRN: [id number] DOB: 10/19/1986  Today's TOC FU Call Status: Today's TOC FU Call Status:: Successful TOC FU Call Completed TOC FU Call Complete Date: 08/07/24 Patient's Name and Date of Birth confirmed.  Transition Care Management Follow-up Telephone Call Date of Discharge: 08/04/24 Discharge Facility: [facility]) Type of Discharge: Inpatient Admission Primary Inpatient Discharge Diagnosis:: Diabetic ketoacidosis (DKA) How have you been since you were released from the hospital?: Better Any questions or concerns?: No  Items Reviewed: Did you receive and understand the discharge instructions provided?: Yes Medications obtained,verified, and reconciled?: Yes (Medications Reviewed) Any new allergies since your discharge?: No Dietary orders reviewed?: Yes Type of Diet Ordered:: Diabetic & Low fat diet Do you have support at home?: Yes People in Home [RPT]: child(ren), adult, significant other Name of Support/Comfort Primary Source: Patient lives with 2 adult children and girlfriend  Medications Reviewed Today: Medications Reviewed Today     Reviewed by Lauro Shona LABOR, RN (Registered Nurse) on 08/07/24 at 1256  Med List Status: <None>   Medication Order Taking? Sig Documenting Provider Last Dose Status Informant  acetaminophen  (TYLENOL ) 325 MG tablet [id number] Yes Take 2 tablets (650 mg total) by mouth every 6 (six) hours as needed for mild pain (pain score 1-3) or fever (or Fever >/= 101). Barbarann Nest, MD  Active   carvedilol  (COREG ) 12.5 MG tablet [id number] Yes Take 1 tablet (12.5 mg total) by mouth 2 (two) times daily with a meal. Drusilla Sabas RAMAN, MD  Active Self  Continuous Glucose Receiver [facility] G7 RECEIVER) DEVI [id number] Yes Use as directed.   Active Self  Continuous Glucose Sensor (DEXCOM G7 SENSOR) MISC [id number] Yes Use as directed and change every 10 days.   Active Self  gabapentin  (NEURONTIN ) 100 MG capsule [id number] Yes Take 1  capsule (100 mg total) by mouth 2 (two) times daily.  Patient taking differently: Take 100 mg by mouth 2 (two) times daily as needed.   Leigh Venetia CROME, MD  Active Self  insulin  aspart (NOVOLOG  FLEXPEN) 100 UNIT/ML FlexPen [id number] Yes Max daily 30 units [name], Ibtehal Jaralla, MD  Active Self           Med Note (GALARZA RODRIGUEZ, SUSAN A   Tue Mar 27, 2024  6:47 PM)    insulin  glargine (LANTUS ) 100 UNIT/ML Solostar Pen [id number] Yes Inject 12 Units into the skin daily. Barbarann Nest, MD  Active   Insulin  Pen Needle 32G X 4 MM MISC [id number] Yes 1 Device by Does not apply route in the morning, at noon, in the evening, and at bedtime. [name], Donell Cardinal, MD  Active Self  Insulin  Pen Needle 32G X 4 MM MISC [id number] Yes Inject 1 each into the skin daily. Barbarann Nest, MD  Active   lipase/protease/amylase (CREON ) 36000 UNITS CPEP capsule [id number] Yes Take 1-2 capsules (36,000-72,000 Units total) by mouth See admin instructions. Take 72,000 units by mouth three times a day before meals and 36,000 units with any snacks [name], Aloha Raddle., MD  Active Self  multivitamin (ONE-A-DAY MEN'S) TABS tablet [id number] Yes Take 1 tablet by mouth daily with breakfast. [name], Dinah C, NP  Active Self  naloxone  (NARCAN ) nasal spray 4 mg/0.1 mL [id number] Yes Place 1 spray into the nose once as needed (Overdose). [provider]  Active Self  oxyCODONE  (OXY IR/ROXICODONE ) 5 MG immediate release tablet [id number] Yes Take 1 tablet (5 mg total) by mouth every 4 (four) hours as needed for up to 5 days  for severe pain (pain score 7-10). Barbarann Nest, MD  Active   pantoprazole  (PROTONIX ) 40 MG tablet [id number] Yes Take 1 tablet (40 mg total) by mouth 2 (two) times daily. Barbarann Nest, MD  Active   potassium citrate  (UROCIT-K ) 10 MEQ (1080 MG) SR tablet [id number] Yes Take 1 tablet (10 mEq total) by mouth 2 (two) times daily after a meal.   Active Self  sucralfate  (CARAFATE ) 1 GM/10ML  suspension [id number] Yes Take 10 mLs (1 g total) by mouth 4 (four) times daily -  with meals and at bedtime. Barbarann Nest, MD  Active             Home Care and Equipment/Supplies: Were Home Health Services Ordered?: No Any new equipment or medical supplies ordered?: No  Functional Questionnaire: Do you need assistance with bathing/showering or dressing?: No Do you need assistance with meal preparation?: No Do you need assistance with eating?: No Do you have difficulty maintaining continence: No Do you need assistance with getting out of bed/getting out of a chair/moving?: No Do you have difficulty managing or taking your medications?: No  Follow up appointments reviewed: PCP Follow-up appointment confirmed?: Yes Date of PCP follow-up appointment?: 08/08/24 Follow-up Provider: [facility] Follow-up appointment confirmed?:  (being referred to [facility] surgery for this issue and will f/u GI, Dr Aloha Finner) Do you need transportation to your follow-up appointment?: No Do you understand care options if your condition(s) worsen?: Yes-patient verbalized understanding  SDOH Interventions Today    Flowsheet Row Most Recent Value  SDOH Interventions   Food Insecurity Interventions Intervention Not Indicated  Housing Interventions Intervention Not Indicated  Transportation Interventions Intervention Not Indicated  Utilities Interventions Intervention Not Indicated    Goals Addressed             This Visit's Progress    COMPLETED: VBCI Transitions of Care (TOC) Care Plan       Problems:  Recent Hospitalization for treatment of Diabetic ketoacidosis without coma associated with type 1 diabetes mellitus No Hospital Follow Up Provider appointment Patient did not have hospital follow up appointments scheduled with PCP or GI - Conference calls were made with patient to both and appointments were scheduled  Goal: Closing TOC Program - Unable to mark goal met  related to unable to reach patient for follow up x 3 Over the next 30 days, the patient will not experience hospital readmission  Interventions:  Transitions of Care: Doctor Visits  - discussed the importance of doctor visits Arranged PCP follow-up within 7 days (Care Guide Scheduled) Reviewed with patient plan for cholecystectomy - Patient states he is to call [facility] surgery and states he has the number to call and states he has a f/u with his GI, Dr Finner but didn't have the date in front of him   Diabetes Interventions: Assessed patient's understanding of A1c goal: <6.5% Reviewed medications with patient and discussed importance of medication adherence Discussed plans with patient for ongoing care management follow up and provided patient with direct contact information for care management team Reviewed scheduled/upcoming provider appointments including: PCP appointment scheduled by [facility] RN for 04/04/24 and GI, Dr Finner 04/26/24 Lab Results  Component Value Date   HGBA1C 7.9 (A) 09/19/2023    Patient Self Care Activities:  Attend all scheduled provider appointments Call pharmacy for medication refills 3-7 days in advance of running out of medications Call provider office for new concerns or questions  Notify RN Care Manager of [facility] call rescheduling needs Participate  in Transition of Care Program/Attend TOC scheduled calls Take medications as prescribed   take the blood sugar log to all doctor visits drink 6 to 8 glasses of water each day  Plan: Closing TOC Program - Unable to mark goal met related to unable to reach patient for follow up x 3      VBCI Transitions of Care (TOC) Care Plan       Problems:  Recent Hospitalization for treatment of Diabetic ketoacidosis (DKA) Patient has been unable to get an appointment with [facility]   Goal:  Over the next 30 days, the patient will not experience hospital readmission  Interventions:  Transitions of Care: Doctor  Visits  - discussed the importance of doctor visits  Diabetes Interventions: Assessed patient's understanding of A1c goal: <7% Provided education to patient about basic DM disease process Reviewed medications with patient and discussed importance of medication adherence Discussed plans with patient for ongoing care management follow up and provided patient with direct contact information for care management team Reviewed scheduled/upcoming provider appointments including: PCP appointment 08/08/24  Assessed social determinant of health barriers Lab Results  Component Value Date   HGBA1C 9.3 (H) 08/02/2024    Pain Interventions: Pain assessment performed Medications reviewed Discussed importance of adherence to all scheduled medical appointments Counseled on the importance of reporting any/all new or changed pain symptoms or management strategies to pain management provider Advised patient to report to care team affect of pain on daily activities Patient reported he was referred to [facility] months ago and he has called but does not get a call back - TOC RN called and spoke with [place] in conference with patient and appointment has now been scheduled for this Friday 08/10/24  Patient Self Care Activities:  Attend all scheduled provider appointments Call pharmacy for medication refills 3-7 days in advance of running out of medications Call provider office for new concerns or questions  Notify RN Care Manager of TOC call rescheduling needs Participate in Transition of Care Program/Attend TOC scheduled calls Take medications as prescribed   check blood sugar at prescribed times: Monitor via Dexcom check feet daily for cuts, sores or redness take the blood sugar log to all doctor visits manage portion size wash and dry feet carefully every day wear comfortable, cotton socks wear comfortable, well-fitting shoes  Plan:  Telephone follow up appointment with care management team  member scheduled for:  08/15/24 in the afternoon The patient has been provided with contact information for the care management team and has been advised to call with any health related questions or concerns.         Shona Prow RN, CCM [place]  VBCI-Population Health RN Care Manager [phone]

## 2024-08-08 ENCOUNTER — Ambulatory Visit (INDEPENDENT_AMBULATORY_CARE_PROVIDER_SITE_OTHER): Admitting: Family

## 2024-08-08 ENCOUNTER — Other Ambulatory Visit (HOSPITAL_BASED_OUTPATIENT_CLINIC_OR_DEPARTMENT_OTHER): Payer: Self-pay

## 2024-08-08 ENCOUNTER — Encounter: Payer: Self-pay | Admitting: Family

## 2024-08-08 ENCOUNTER — Ambulatory Visit: Payer: Self-pay | Admitting: Gastroenterology

## 2024-08-08 VITALS — BP 124/72 | HR 88 | Temp 97.8°F | Resp 19 | Ht 77.0 in | Wt 149.0 lb

## 2024-08-08 DIAGNOSIS — R600 Localized edema: Secondary | ICD-10-CM | POA: Diagnosis not present

## 2024-08-08 DIAGNOSIS — K860 Alcohol-induced chronic pancreatitis: Secondary | ICD-10-CM

## 2024-08-08 DIAGNOSIS — F1020 Alcohol dependence, uncomplicated: Secondary | ICD-10-CM

## 2024-08-08 LAB — COMPREHENSIVE METABOLIC PANEL WITH GFR
AG Ratio: 1.8 (calc) (ref 1.0–2.5)
ALT: 47 U/L — ABNORMAL HIGH (ref 9–46)
AST: 74 U/L — ABNORMAL HIGH (ref 10–40)
Albumin: 4 g/dL (ref 3.6–5.1)
Alkaline phosphatase (APISO): 139 U/L — ABNORMAL HIGH (ref 36–130)
BUN: 7 mg/dL (ref 7–25)
CO2: 30 mmol/L (ref 20–32)
Calcium: 9.2 mg/dL (ref 8.6–10.3)
Chloride: 102 mmol/L (ref 98–110)
Creat: 0.77 mg/dL (ref 0.60–1.26)
Globulin: 2.2 g/dL (ref 1.9–3.7)
Glucose, Bld: 223 mg/dL — ABNORMAL HIGH (ref 65–139)
Potassium: 3.9 mmol/L (ref 3.5–5.3)
Sodium: 139 mmol/L (ref 135–146)
Total Bilirubin: 0.8 mg/dL (ref 0.2–1.2)
Total Protein: 6.2 g/dL (ref 6.1–8.1)
eGFR: 118 mL/min/1.73m2 (ref >=60)

## 2024-08-08 MED ORDER — OXYCODONE HCL 5 MG PO TABS
5.0000 mg | ORAL_TABLET | Freq: Three times a day (TID) | ORAL | 0 refills | Status: DC | PRN
  Filled 2024-08-08: qty 90, 30d supply, fill #0

## 2024-08-08 NOTE — Progress Notes (Signed)
 Provider: Roxan Plough FNP-C  Jayen Bromwell, Roxan BROCKS, NP  Patient Care Team: Undine Nealis, Roxan BROCKS, NP as PCP - General (Family Medicine) Arlana Arnt, MD as Consulting Physician (Otolaryngology) Lauro Shona LABOR, RN as Registered Nurse  Extended Emergency Contact Information Primary Emergency Contact: brathwaite,naketa Address: [address]          [place], [place] [address] [place] Home Phone: [phone] Work Phone: [phone] Mobile Phone: [phone] Relation: Significant other  Code Status:  Full Code  Goals of care: Advanced Directive information    08/02/2024    4:00 AM  Advanced Directives  Would patient like information on creating a medical advance directive? No - Patient declined     Chief Complaint  Patient presents with   Swelling    Discussed the use of AI scribe software for clinical note transcription with the patient, who gave verbal consent to proceed.  History of Present Illness   Carlos [name] is a 38 year old male with gallstones and chronic pancreatitis who presents with swollen feet and gallbladder pain.  He has been experiencing swollen feet since Monday, which began after starting a new medication, possibly Sucralfate . Despite discontinuing the medication yesterday, the swelling has not significantly improved. There have been no changes in diet, particularly salt intake. 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  He is currently taking carvedilol  for blood pressure and Protonix  twice a day. He also uses oxycodone  for pain management approximately three times a day due to his work schedule. He recently received a five-day supply of oxycodone  from the hospital.  He has been experiencing significant gallbladder pain, described as a constant pain under his rib cage, for about three weeks. The pain is severe, rated at an eight out of ten, and is not adequately relieved by his current pain  medication regimen. He has a history of gallstones and chronic pancreatitis, with recent imaging showing inflammation of the pancreas and gallstones. The severity of the pain has prevented him from working.  He has been trying to contact pain management since March 20th but has not received a response. He is scheduled for a procedure at [facility] to address his gallbladder issues, although he does not recall the specific name of the procedure. Insurance is not an issue for the referral process for pain management.    Past Medical History:  Diagnosis Date   AKI (acute kidney injury) 12/21/2021   Anxiety    Diabetes (HCC)    Gall stones 2025   Pancreatitis    Splenic vein thrombosis    Past Surgical History:  Procedure Laterality Date   BILIARY BRUSHING  02/02/2023   Procedure: BILIARY BRUSHING;  Surgeon: Wilhelmenia Aloha Raddle., MD;  Location: [facility];  Service: Gastroenterology;;   BILIARY BRUSHING  04/28/2023   Procedure: BILIARY BRUSHING;  Surgeon: Aneita Gwendlyn DASEN, MD;  Location: [facility]COPY;  Service: Gastroenterology;;   BILIARY BRUSHING  06/02/2023   Procedure: BILIARY BRUSHING;  Surgeon: Wilhelmenia Aloha Raddle., MD;  Location: [facility];  Service: Gastroenterology;;   BILIARY BRUSHING  01/05/2024   Procedure: BILIARY BRUSHING;  Surgeon: Wilhelmenia Aloha Raddle., MD;  Location: [facility];  Service: Gastroenterology;;   BILIARY DILATION  12/01/2022   Procedure: BILIARY DILATION;  Surgeon: Wilhelmenia Aloha Raddle., MD;  Location: [facility];  Service: Gastroenterology;;   BILIARY DILATION  02/02/2023   Procedure: BILIARY DILATION;  Surgeon: Wilhelmenia Aloha Raddle., MD;  Location: [facility];  Service: Gastroenterology;;   BILIARY DILATION  01/05/2024   Procedure: BILIARY DILATION;  Surgeon: Wilhelmenia Aloha Raddle., MD;  Location: [facility];  Service: Gastroenterology;;   BILIARY STENT PLACEMENT  04/28/2023   Procedure: BILIARY STENT PLACEMENT;  Surgeon: Aneita Gwendlyn DASEN, MD;  Location: [facility];  Service: Gastroenterology;;   BILIARY STENT PLACEMENT N/A 06/02/2023   Procedure: BILIARY STENT PLACEMENT;  Surgeon: Wilhelmenia Aloha Raddle., MD;  Location: [facility];  Service: Gastroenterology;  Laterality: N/A;   BIOPSY  12/02/2021   Procedure: BIOPSY;  Surgeon: Wilhelmenia Aloha Raddle., MD;  Location: [facility];  Service: Gastroenterology;;   ENDOSCOPIC RETROGRADE CHOLANGIOPANCREATOGRAPHY (ERCP) WITH PROPOFOL  N/A 08/12/2022   Procedure: ENDOSCOPIC RETROGRADE CHOLANGIOPANCREATOGRAPHY (ERCP) WITH PROPOFOL ;  Surgeon: Wilhelmenia Aloha Raddle., MD;  Location: [facility];  Service: Gastroenterology;  Laterality: N/A;   ENDOSCOPIC RETROGRADE CHOLANGIOPANCREATOGRAPHY (ERCP) WITH PROPOFOL  N/A 12/01/2022   Procedure: ENDOSCOPIC RETROGRADE CHOLANGIOPANCREATOGRAPHY (ERCP) WITH PROPOFOL ;  Surgeon: Wilhelmenia Aloha Raddle., MD;  Location: [facility];  Service: Gastroenterology;  Laterality: N/A;   ENDOSCOPIC RETROGRADE CHOLANGIOPANCREATOGRAPHY (ERCP) WITH PROPOFOL  N/A 02/02/2023   Procedure: ENDOSCOPIC RETROGRADE CHOLANGIOPANCREATOGRAPHY (ERCP) WITH PROPOFOL ;  Surgeon: Wilhelmenia Aloha Raddle., MD;  Location: [facility];  Service: Gastroenterology;  Laterality: N/A;   ENDOSCOPIC RETROGRADE CHOLANGIOPANCREATOGRAPHY (ERCP) WITH PROPOFOL  N/A 06/02/2023   Procedure: ENDOSCOPIC RETROGRADE CHOLANGIOPANCREATOGRAPHY (ERCP) WITH PROPOFOL ;  Surgeon: Wilhelmenia Aloha Raddle., MD;  Location: [facility];  Service: Gastroenterology;  Laterality: N/A;   ENDOSCOPIC RETROGRADE CHOLANGIOPANCREATOGRAPHY (ERCP) WITH PROPOFOL  N/A 01/05/2024   Procedure: ENDOSCOPIC RETROGRADE CHOLANGIOPANCREATOGRAPHY (ERCP) WITH PROPOFOL ;  Surgeon: Wilhelmenia Aloha Raddle., MD;  Location: [facility];  Service: Gastroenterology;  Laterality: N/A;   ERCP N/A 04/28/2023   Procedure: ENDOSCOPIC RETROGRADE CHOLANGIOPANCREATOGRAPHY (ERCP);  Surgeon: Aneita Gwendlyn DASEN, MD;  Location: [facility];  Service:  Gastroenterology;  Laterality: N/A;   ESOPHAGOGASTRODUODENOSCOPY N/A 12/01/2022   Procedure: ESOPHAGOGASTRODUODENOSCOPY (EGD);  Surgeon: Wilhelmenia Aloha Raddle., MD;  Location: [facility];  Service: Gastroenterology;  Laterality: N/A;   ESOPHAGOGASTRODUODENOSCOPY N/A 08/04/2024   Procedure: EGD (ESOPHAGOGASTRODUODENOSCOPY);  Surgeon: Legrand Victory LITTIE DOUGLAS, MD;  Location: [facility];  Service: Gastroenterology;  Laterality: N/A;   ESOPHAGOGASTRODUODENOSCOPY (EGD) WITH PROPOFOL  N/A 12/02/2021   Procedure: ESOPHAGOGASTRODUODENOSCOPY (EGD) WITH PROPOFOL ;  Surgeon: Wilhelmenia Aloha Raddle., MD;  Location: [facility];  Service: Gastroenterology;  Laterality: N/A;   ESOPHAGOGASTRODUODENOSCOPY (EGD) WITH PROPOFOL  N/A 07/09/2023   Procedure: ESOPHAGOGASTRODUODENOSCOPY (EGD) WITH PROPOFOL ;  Surgeon: Abran Norleen SAILOR, MD;  Location: [facility];  Service: Gastroenterology;  Laterality: N/A;  needs ercp scope for stent pull   FINE NEEDLE ASPIRATION N/A 12/01/2022   Procedure: FINE NEEDLE ASPIRATION (FNA) LINEAR;  Surgeon: Wilhelmenia Aloha Raddle., MD;  Location: [facility];  Service: Gastroenterology;  Laterality: N/A;   NEUROLYTIC CELIAC PLEXUS  12/01/2022   Procedure: CELIAC PLEXUS BLOCK;  Surgeon: Wilhelmenia Aloha Raddle., MD;  Location: [facility];  Service: Gastroenterology;;   PANCREATIC STENT PLACEMENT  08/12/2022   Procedure: PANCREATIC STENT PLACEMENT;  Surgeon: Wilhelmenia Aloha Raddle., MD;  Location: [facility];  Service: Gastroenterology;;   PANCREATIC STENT PLACEMENT  12/01/2022   Procedure: PANCREATIC STENT PLACEMENT;  Surgeon: Wilhelmenia Aloha Raddle., MD;  Location: [facility];  Service: Gastroenterology;;   PANCREATIC STENT PLACEMENT  02/02/2023   Procedure: PANCREATIC STENT PLACEMENT;  Surgeon: Wilhelmenia Aloha Raddle., MD;  Location: [facility];  Service: Gastroenterology;;   PANCREATIC STENT PLACEMENT  06/02/2023   Procedure: PANCREATIC STENT PLACEMENT;  Surgeon: Wilhelmenia Aloha Raddle., MD;  Location: [facility];  Service: Gastroenterology;;   REMOVAL OF STONES  12/01/2022  Procedure: REMOVAL OF STONES;  Surgeon: [name], Aloha Raddle., MD;  Location: [facility];  Service: Gastroenterology;;   REMOVAL OF STONES  02/02/2023   Procedure: REMOVAL OF STONES;  Surgeon: Wilhelmenia Aloha Raddle., MD;  Location: [facility];  Service: Gastroenterology;;   REMOVAL OF STONES  06/02/2023   Procedure: REMOVAL OF STONES;  Surgeon: Wilhelmenia Aloha Raddle., MD;  Location: [facility];  Service: Gastroenterology;;   REMOVAL OF STONES  01/05/2024   Procedure: REMOVAL OF SLUDGE;  Surgeon: Wilhelmenia Aloha Raddle., MD;  Location: [facility];  Service: Gastroenterology;;   ANNETT  08/12/2022   Procedure: ANNETT;  Surgeon: Wilhelmenia Aloha Raddle., MD;  Location: [facility];  Service: Gastroenterology;;   ANNETT  12/01/2022   Procedure: ANNETT;  Surgeon: [name], Aloha Raddle., MD;  Location: [facility];  Service: Gastroenterology;;   ANNETT  06/02/2023   Procedure: ANNETT;  Surgeon: Wilhelmenia Aloha Raddle., MD;  Location: [facility];  Service: Gastroenterology;;   CLEDA REMOVAL  12/01/2022   Procedure: STENT REMOVAL;  Surgeon: Wilhelmenia Aloha Raddle., MD;  Location: [facility];  Service: Gastroenterology;;   CLEDA REMOVAL  02/02/2023   Procedure: STENT REMOVAL;  Surgeon: Wilhelmenia Aloha Raddle., MD;  Location: [facility];  Service: Gastroenterology;;   CLEDA REMOVAL  06/02/2023   Procedure: STENT REMOVAL;  Surgeon: Wilhelmenia Aloha Raddle., MD;  Location: [facility];  Service: Gastroenterology;;   CLEDA REMOVAL  07/09/2023   Procedure: STENT REMOVAL;  Surgeon: Abran Norleen SAILOR, MD;  Location: [facility];  Service: Gastroenterology;;   CLEDA REMOVAL  01/05/2024   Procedure: STENT REMOVAL;  Surgeon: Wilhelmenia Aloha Raddle., MD;  Location: [facility];  Service: Gastroenterology;;   UPPER ESOPHAGEAL ENDOSCOPIC ULTRASOUND  (EUS) N/A 12/02/2021   Procedure: UPPER ESOPHAGEAL ENDOSCOPIC ULTRASOUND (EUS);  Surgeon: Wilhelmenia Aloha Raddle., MD;  Location: [facility];  Service: Gastroenterology;  Laterality: N/A;   UPPER ESOPHAGEAL ENDOSCOPIC ULTRASOUND (EUS) N/A 12/01/2022   Procedure: UPPER ESOPHAGEAL ENDOSCOPIC ULTRASOUND (EUS);  Surgeon: Wilhelmenia Aloha Raddle., MD;  Location: [facility];  Service: Gastroenterology;  Laterality: N/A;    Allergies  Allergen Reactions   Other Anaphylaxis and Other (See Comments)    [name] - anaphylaxis    Outpatient Encounter Medications as of 08/08/2024  Medication Sig   acetaminophen  (TYLENOL ) 325 MG tablet Take 2 tablets (650 mg total) by mouth every 6 (six) hours as needed for mild pain (pain score 1-3) or fever (or Fever >/= 101).   carvedilol  (COREG ) 12.5 MG tablet Take 1 tablet (12.5 mg total) by mouth 2 (two) times daily with a meal.   Continuous Glucose Receiver (DEXCOM G7 RECEIVER) DEVI Use as directed.   Continuous Glucose Sensor (DEXCOM G7 SENSOR) MISC Use as directed and change every 10 days.   gabapentin  (NEURONTIN ) 100 MG capsule Take 1 capsule (100 mg total) by mouth 2 (two) times daily. (Patient taking differently: Take 100 mg by mouth 2 (two) times daily as needed.)   insulin  aspart (NOVOLOG  FLEXPEN) 100 UNIT/ML FlexPen Max daily 30 units   insulin  glargine (LANTUS ) 100 UNIT/ML Solostar Pen Inject 12 Units into the skin daily.   Insulin  Pen Needle 32G X 4 MM MISC 1 Device by Does not apply route in the morning, at noon, in the evening, and at bedtime.   Insulin  Pen Needle 32G X 4 MM MISC Inject 1 each into the skin daily.   multivitamin (ONE-A-DAY MEN'S) TABS tablet Take 1 tablet by mouth daily with breakfast.   naloxone  (NARCAN ) nasal spray 4 mg/0.1 mL Place 1 spray into  the nose once as needed (Overdose).   pantoprazole  (PROTONIX ) 40 MG tablet Take 1 tablet (40 mg total) by mouth 2 (two) times daily.   potassium citrate  (UROCIT-K ) 10 MEQ (1080 MG) SR  tablet Take 1 tablet (10 mEq total) by mouth 2 (two) times daily after a meal.   [DISCONTINUED] oxyCODONE  (OXY IR/ROXICODONE ) 5 MG immediate release tablet Take 1 tablet (5 mg total) by mouth every 4 (four) hours as needed for up to 5 days for severe pain (pain score 7-10).   lipase/protease/amylase (CREON ) 36000 UNITS CPEP capsule Take 1-2 capsules (36,000-72,000 Units total) by mouth See admin instructions. Take 72,000 units by mouth three times a day before meals and 36,000 units with any snacks (Patient not taking: Reported on 08/08/2024)   oxyCODONE  (OXY IR/ROXICODONE ) 5 MG immediate release tablet Take 1 tablet (5 mg total) by mouth every 8 (eight) hours as needed for severe pain (pain score 7-10).   [DISCONTINUED] sucralfate  (CARAFATE ) 1 GM/10ML suspension Take 10 mLs (1 g total) by mouth 4 (four) times daily -  with meals and at bedtime. (Patient not taking: Reported on 08/08/2024)   No facility-administered encounter medications on file as of 08/08/2024.    Review of Systems  Constitutional:  Negative for appetite change, chills, fatigue, fever and unexpected weight change.  Eyes:  Negative for pain, discharge, redness, itching and visual disturbance.  Respiratory:  Negative for cough, chest tightness, shortness of breath and wheezing.   Cardiovascular:  Positive for leg swelling. Negative for chest pain and palpitations.  Gastrointestinal:  Positive for abdominal pain. Negative for abdominal distention, blood in stool, constipation, diarrhea, nausea and vomiting.  Musculoskeletal:  Negative for arthralgias, back pain, gait problem, joint swelling and myalgias.  Skin:  Negative for color change, pallor, rash and wound.  Neurological:  Negative for dizziness, weakness, light-headedness, numbness and headaches.    Immunization History  Administered Date(s) Administered   Influenza,inj,Quad PF,6+ Mos 09/15/2021   Influenza-Unspecified 08/09/2022   Tdap 10/08/2022   Pertinent  Health  Maintenance Due  Topic Date Due   OPHTHALMOLOGY EXAM  Never done   Influenza Vaccine  03/19/2025 (Originally 06/08/2024)   FOOT EXAM  09/18/2024   HEMOGLOBIN A1C  01/30/2025      07/06/2023    8:59 AM 11/07/2023    9:55 AM 02/16/2024    8:03 AM 04/04/2024   10:52 AM 06/21/2024    2:12 PM  Fall Risk  Falls in the past year? 0 0 0 0 0  Was there an injury with Fall? 0 0 0 0 0  Fall Risk Category Calculator 0 0 0 0 0  Patient at Risk for Falls Due to No Fall Risks   No Fall Risks No Fall Risks  Fall risk Follow up Falls evaluation completed;Education provided;Falls prevention discussed  Falls evaluation completed Falls evaluation completed Falls evaluation completed   Functional Status Survey:    Vitals:   08/08/24 1520  BP: 124/72  Pulse: 88  Resp: 19  Temp: 97.8 F (36.6 C)  SpO2: 96%  Weight: 149 lb (67.6 kg)  Height: 6' 5 (1.956 m)   Body mass index is 17.67 kg/m. Physical Exam  VITALS: T- 97.8, P- 88, BP- 124/72, SaO2- 96% MEASUREMENTS: Weight- 149. GENERAL: Alert, cooperative, well developed, no acute distress. HEENT: Normocephalic, normal oropharynx, moist mucous membranes. CHEST: Clear to auscultation bilaterally, no wheezes, rhonchi, or crackles. CARDIOVASCULAR: Normal heart rate and rhythm, S1 and S2 normal without murmurs. ABDOMEN: Tender to palpation, normal bowel sounds,  non-distended, without organomegaly. EXTREMITIES: Pitting edema on top of the foot, calf muscles sore and tight bilaterally, no cyanosis. NEUROLOGICAL: Cranial nerves grossly intact, moves all extremities without gross motor or sensory deficit.   Labs reviewed: Recent Labs    08/28/23 0340 08/29/23 0428 11/07/23 1034 08/02/24 0305 08/02/24 0426 08/02/24 1224 08/03/24 0315 08/04/24 0612  NA 137 133*   < > 135   < > 134* 135 137  K 4.0 3.7   < > 3.7   < > 4.0 3.7 3.4*  CL 103 98   < > 94*   < > 101 103 104  CO2 26 24   < > 13*   < > 22 22 23   GLUCOSE 213* 184*   < > 196*   < > 133*  265* 108*  BUN 8 7   < > <5*   < > <5* 6 7  CREATININE 0.67 0.66   < > 0.78   < > 0.71 0.84 0.70  CALCIUM  8.8* 9.4   < > 9.6   < > 8.9 8.7* 8.9  MG 1.9 1.9  --  1.8  --   --   --   --   PHOS  --   --   --  2.7  --   --   --   --    < > = values in this interval not displayed.   Recent Labs    08/02/24 0426 08/03/24 1027 08/04/24 0612  AST 68* 61* 55*  ALT 61* 45* 46*  ALKPHOS 205* 166* 172*  BILITOT 0.6 0.8 0.8  PROT 6.5 4.8* 5.2*  ALBUMIN  4.1 3.3* 3.3*   Recent Labs    11/07/23 1034 03/27/24 1608 03/28/24 0328 04/04/24 1146 08/01/24 2348 08/02/24 0305 08/02/24 0426 08/04/24 0612  WBC 3.0* 14.3*   < > 3.3*   < > 5.5 6.1 2.7*  NEUTROABS 1,926 12.8*  --  1,558  --   --   --   --   HGB 14.0 16.6   < > 12.1*   < > 12.2* 11.4* 9.7*  HCT 41.8 50.4   < > 35.7*   < > 37.7* 35.6* 30.0*  MCV 104.8* 111.3*   < > 107.5*   < > 106.8* 108.2* 107.1*  PLT 169 284   < > 282   < > 154 135* 113*   < > = values in this interval not displayed.   Lab Results  Component Value Date   TSH 0.50 11/07/2023   Lab Results  Component Value Date   HGBA1C 9.3 (H) 08/02/2024   Lab Results  Component Value Date   CHOL 104 03/10/2022   HDL 56.50 03/10/2022   LDLCALC 18 03/10/2022   TRIG 153 (H) 07/20/2022   CHOLHDL 2 03/10/2022    Significant Diagnostic Results in last 30 days:  US  Abdomen Limited RUQ (LIVER/GB) Result Date: 08/03/2024 CLINICAL DATA:  Abdominal pain EXAM: ULTRASOUND ABDOMEN LIMITED RIGHT UPPER QUADRANT COMPARISON:  MRI abdomen August 02, 2024 FINDINGS: Gallbladder/CBD: Thick-walled gallbladder (6 mm). Intraluminal layering stones/sludge is present. Common bile duct is dilated up to 6.6 mm. Negative sonographic Murphy's sign. Liver: Heterogeneous increased echogenicity of the liver. No focal liver lesion. No intrahepatic biliary dilatation. At the region of porta hepatis there is suggestion of serpiginous venous structures consistent with cavernous transformation of portal  vein better assessed on prior CT. Normal flow direction. IMPRESSION: Cholelithiasis/gallbladder sludge and increased gallbladder wall thickness. Heterogeneous and increased hepatic echogenicity which  can be seen the setting of hepatocellular disease or hepatic steatosis. No focal liver lesion. Cavernous transformation of portal vein. Electronically Signed   By: Megan  Zare M.D.   On: 08/03/2024 16:29   MR ABDOMEN MRCP W WO CONTAST Result Date: 08/02/2024 CLINICAL DATA:  Hepatitis, acute (Ped 0-17y). EXAM: MRI ABDOMEN WITHOUT AND WITH CONTRAST (INCLUDING MRCP) TECHNIQUE: Multiplanar multisequence MR imaging of the abdomen was performed both before and after the administration of intravenous contrast. Heavily T2-weighted images of the biliary and pancreatic ducts were obtained, and three-dimensional MRCP images were rendered by post processing. CONTRAST:  6mL GADAVIST  GADOBUTROL  1 MMOL/ML IV SOLN COMPARISON:  CT scan abdomen and pelvis from earlier the same day and MRI abdomen from 04/26/2023. FINDINGS: Lower chest: Unremarkable MR appearance to the lung bases. No pleural effusion. No pericardial effusion. Normal heart size. Hepatobiliary: The liver is mildly enlarged. Noncirrhotic configuration. No focal lesion. There is an approximately 4.5 x 6.0 cm geographic area in the right hepatic lobe, segments 5/6 which exhibit marked loss of signal on out of phase images, compatible with focal hepatic steatosis. 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 No intrahepatic or extrahepatic bile duct dilatation. No choledocholithiasis. Unremarkable gallbladder. Pancreas: There is a well-circumscribed T2 hyperintense nonenhancing 2.0 x 2.0 cm structure in the pancreatic tail (series 10, image 17). No communication with the pancreatic side-branch seen on the provided images. The lesion exhibits thin enhancing walls. This is new since the prior MRI abdomen from 04/26/2023. Differential  diagnosis includes intrapancreatic pseudocyst versus cystic pancreatic lesion. Main pancreatic duct is not dilated. No peripancreatic fat stranding. Spleen:  Within normal limits in size and appearance. No focal mass. Adrenals/Urinary Tract: Unremarkable adrenal glands. No hydroureteronephrosis. No suspicious renal mass. Stomach/Bowel: Visualized portions within the abdomen are unremarkable. No disproportionate dilation of bowel loops. Vascular/Lymphatic: No pathologically enlarged lymph nodes identified. No abdominal aortic aneurysm demonstrated. No ascites. Other:  None. Musculoskeletal: No suspicious bone lesions identified. IMPRESSION: 1. There is a 2.0 x 2.0 cm cystic lesion in the pancreatic tail, as described above. Differential diagnosis includes intrapancreatic pseudocyst versus cystic pancreatic lesion. Correlate clinically to determine the need for short-term follow-up with MRI abdomen in 3-6 months. 2. There is an approximately 4.5 x 6.0 cm geographic area in the right hepatic lobe, segments 5/6 which exhibit marked loss of signal on out of phase images, compatible with focal hepatic steatosis. 3. Multiple other nonacute observations, as described above. Electronically Signed   By: Ree Molt M.D.   On: 08/02/2024 15:08   MR 3D Recon At Scanner Result Date: 08/02/2024 CLINICAL DATA:  Hepatitis, acute (Ped 0-17y). EXAM: MRI ABDOMEN WITHOUT AND WITH CONTRAST (INCLUDING MRCP) TECHNIQUE: Multiplanar multisequence MR imaging of the abdomen was performed both before and after the administration of intravenous contrast. Heavily T2-weighted images of the biliary and pancreatic ducts were obtained, and three-dimensional MRCP images were rendered by post processing. CONTRAST:  6mL GADAVIST  GADOBUTROL  1 MMOL/ML IV SOLN COMPARISON:  CT scan abdomen and pelvis from earlier the same day and MRI abdomen from 04/26/2023. FINDINGS: Lower chest: Unremarkable MR appearance to the lung bases. No pleural effusion. No  pericardial effusion. Normal heart size. Hepatobiliary: The liver is mildly enlarged. Noncirrhotic configuration. No focal lesion. There is an approximately 4.5 x 6.0 cm geographic area in the right hepatic lobe, segments 5/6 which exhibit marked loss of signal on out of phase images, compatible with focal hepatic steatosis. Redemonstration of chronic main portal vein thrombosis with cavernous  transformation. No intrahepatic or extrahepatic bile duct dilatation. No choledocholithiasis. Unremarkable gallbladder. Pancreas: There is a well-circumscribed T2 hyperintense nonenhancing 2.0 x 2.0 cm structure in the pancreatic tail (series 10, image 17). No communication with the pancreatic side-branch seen on the provided images. The lesion exhibits thin enhancing walls. This is new since the prior MRI abdomen from 04/26/2023. Differential diagnosis includes intrapancreatic pseudocyst versus cystic pancreatic lesion. Main pancreatic duct is not dilated. No peripancreatic fat stranding. Spleen:  Within normal limits in size and appearance. No focal mass. Adrenals/Urinary Tract: Unremarkable adrenal glands. No hydroureteronephrosis. No suspicious renal mass. Stomach/Bowel: Visualized portions within the abdomen are unremarkable. No disproportionate dilation of bowel loops. Vascular/Lymphatic: No pathologically enlarged lymph nodes identified. No abdominal aortic aneurysm demonstrated. No ascites. Other:  None. Musculoskeletal: No suspicious bone lesions identified. IMPRESSION: 1. There is a 2.0 x 2.0 cm cystic lesion in the pancreatic tail, as described above. Differential diagnosis includes intrapancreatic pseudocyst versus cystic pancreatic lesion. Correlate clinically to determine the need for short-term follow-up with MRI abdomen in 3-6 months. 2. There is an approximately 4.5 x 6.0 cm geographic area in the right hepatic lobe, segments 5/6 which exhibit marked loss of signal on out of phase images, compatible with focal  hepatic steatosis. 3. Multiple other nonacute observations, as described above. Electronically Signed   By: Ree Molt M.D.   On: 08/02/2024 15:08   CT ABDOMEN PELVIS W CONTRAST Result Date: 08/02/2024 EXAM: CTA CHEST PE WITH CONTRAST CT ABDOMEN AND PELVIS WITH CONTRAST 08/02/2024 03:44:27 AM TECHNIQUE: CTA of the chest was performed after the administration of 100 mL of iohexol  (OMNIPAQUE ) 350 MG/ML injection. Multiplanar reformatted images are provided for review. MIP images are provided for review. CT of the abdomen and pelvis was performed with the administration of intravenous contrast. Automated exposure control, iterative reconstruction, and/or weight based adjustment of the mA/kV was utilized to reduce the radiation dose to as low as reasonably achievable. COMPARISON: CT abdomen / pelvis dated 03/28/2024. CLINICAL HISTORY: Pancreatitis suspected. Chest pain x3 weeks. Reports worse today. Reports pain in chest is similar to a pancreatitis flare he had previously, wbc's 5.3, GFR>60; 100 ml omni 350. FINDINGS: CHEST: PULMONARY ARTERIES: Pulmonary arteries are adequately opacified for evaluation. No intraluminal filling defect to suggest pulmonary embolism. Main pulmonary artery is normal in caliber. MEDIASTINUM: No mediastinal lymphadenopathy. The heart and pericardium demonstrate no acute abnormality. There is no acute abnormality of the thoracic aorta. LUNGS AND PLEURA: The lungs are without acute process. No focal consolidation or pulmonary edema. No pleural effusion or pneumothorax. SOFT TISSUES AND BONES: No acute bone or soft tissue abnormality. ABDOMEN AND PELVIS: LIVER: The liver is unremarkable. GALLBLADDER AND BILE DUCTS: Gallbladder is unremarkable. No biliary ductal dilatation. SPLEEN: Spleen demonstrates no acute abnormality. PANCREAS: Coarse parenchymal calcifications with atrophy along the pancreas, reflecting sequela of prior / chronic pancreatitis. 2.1 cm pseudocyst along the pancreatic  tail (image 18), similar. ADRENAL GLANDS: Adrenal glands demonstrate no acute abnormality. KIDNEYS, URETERS AND BLADDER: No stones in the kidneys or ureters. No hydronephrosis. No perinephric or periureteral stranding. Urinary bladder is moderately distended. GI AND BOWEL: Mild wall thickening along the proximal duodenum (image 29), correlate for duodenitis. There is no bowel obstruction. No abnormal bowel wall thickening or distension. REPRODUCTIVE: Reproductive organs are unremarkable. PERITONEUM AND RETROPERITONEUM: No ascites or free air. LYMPH NODES: No lymphadenopathy. BONES AND SOFT TISSUES: No acute abnormality of the visualized bones. No focal soft tissue abnormality. 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 IMPRESSION: 1. No evidence of pulmonary embolism. Negative CT chest. 2. Chronic pancreatitis with 2.1 cm pancreatic tail pseudocyst, similar. No acute inflammatory changes. 3. Mild proximal duodenal wall thickening, suggestive of duodenitis. 4. 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 Electronically signed by: Pinkie Pebbles MD 08/02/2024 03:51 AM EDT RP Workstation: [id number]   CT Angio Chest PE W and/or Wo Contrast Result Date: 08/02/2024 EXAM: CTA CHEST PE WITH CONTRAST CT ABDOMEN AND PELVIS WITH CONTRAST 08/02/2024 03:44:27 AM TECHNIQUE: CTA of the chest was performed after the administration of 100 mL of iohexol  (OMNIPAQUE ) 350 MG/ML injection. Multiplanar reformatted images are provided for review. MIP images are provided for review. CT of the abdomen and pelvis was performed with the administration of intravenous contrast. Automated exposure control, iterative reconstruction, and/or weight based adjustment of the mA/kV was utilized to reduce the radiation dose to as low as reasonably achievable. COMPARISON: CT abdomen / pelvis dated 03/28/2024. CLINICAL HISTORY: Pancreatitis suspected. Chest pain x3 weeks. Reports worse today. Reports  pain in chest is similar to a pancreatitis flare he had previously, wbc's 5.3, GFR>60; 100 ml omni 350. FINDINGS: CHEST: PULMONARY ARTERIES: Pulmonary arteries are adequately opacified for evaluation. No intraluminal filling defect to suggest pulmonary embolism. Main pulmonary artery is normal in caliber. MEDIASTINUM: No mediastinal lymphadenopathy. The heart and pericardium demonstrate no acute abnormality. There is no acute abnormality of the thoracic aorta. LUNGS AND PLEURA: The lungs are without acute process. No focal consolidation or pulmonary edema. No pleural effusion or pneumothorax. SOFT TISSUES AND BONES: No acute bone or soft tissue abnormality. ABDOMEN AND PELVIS: LIVER: The liver is unremarkable. GALLBLADDER AND BILE DUCTS: Gallbladder is unremarkable. No biliary ductal dilatation. SPLEEN: Spleen demonstrates no acute abnormality. PANCREAS: Coarse parenchymal calcifications with atrophy along the pancreas, reflecting sequela of prior / chronic pancreatitis. 2.1 cm pseudocyst along the pancreatic tail (image 18), similar. ADRENAL GLANDS: Adrenal glands demonstrate no acute abnormality. KIDNEYS, URETERS AND BLADDER: No stones in the kidneys or ureters. No hydronephrosis. No perinephric or periureteral stranding. Urinary bladder is moderately distended. GI AND BOWEL: Mild wall thickening along the proximal duodenum (image 29), correlate for duodenitis. There is no bowel obstruction. No abnormal bowel wall thickening or distension. REPRODUCTIVE: Reproductive organs are unremarkable. PERITONEUM AND RETROPERITONEUM: No ascites or free air. LYMPH NODES: No lymphadenopathy. BONES AND SOFT TISSUES: No acute abnormality of the visualized bones. No focal soft tissue abnormality. 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 IMPRESSION: 1. No evidence of pulmonary embolism. Negative CT chest. 2. Chronic pancreatitis with 2.1 cm pancreatic tail pseudocyst, similar. No acute inflammatory  changes. 3. Mild proximal duodenal wall thickening, suggestive of duodenitis. 4. 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 Electronically signed by: Pinkie Pebbles MD 08/02/2024 03:51 AM EDT RP Workstation: [id number]   DG Chest 2 View Result Date: 08/01/2024 EXAM: 2 VIEW(S) XRAY OF THE CHEST 08/01/2024 11:54:22 PM COMPARISON: 03/27/2024 CLINICAL HISTORY: CP. Pancreatitis, upper abdominal and chest pain FINDINGS: LUNGS AND PLEURA: No focal pulmonary opacity. No pulmonary edema. No pleural effusion. No pneumothorax. HEART AND MEDIASTINUM: No acute abnormality of the cardiac and mediastinal silhouettes. BONES AND SOFT TISSUES: No acute osseous abnormality. IMPRESSION: 1. No acute abnormalities. Electronically signed by: Pinkie Pebbles MD 08/01/2024 11:57 PM EDT RP Workstation: [id number]    Assessment/Plan  Chronic pancreatitis with acute exacerbation Pain under the rib cage, constant, not fully relieved by current pain management regimen. Recent hospitalization and endoscopy confirmed gallstones and pancreatic inflammation. - Increase oxycodone  to 5 mg every  8 hours as needed for pain, quantity 90 - Follow up with pain management referral  Cholelithiasis with cholecystitis Confirmed by recent endoscopy with gallbladder inflammation. - Coordinate with [employer] for surgical intervention  Chronic pain syndrome Current management includes oxycodone . Pain management referral pending since March 20th. - Update narcotic use contract patient taking 5 mg tablet three times daily increase Quantity from 60 pills to 90 pills while awaiting Gall bladder surgery and pain management. Narcotic use contract signed. Side effects discussed - Follow up with referral coordinator regarding pain management referral  Bilateral lower extremity edema Noted since Monday. No associated shortness of breath or cough. No recent dietary changes or new medications except for Sucralfate , which was stopped. No  signs of deep vein thrombosis such as redness or heat. Possible fluid retention due to medication or other causes. - Advise wearing compression stockings during the day until bedtime - Order lab work to check electrolytes  Hypertension Currently managed with carvedilol . Blood pressure is well-controlled at 124/72.   Family/ staff Communication: Reviewed plan of care with patient verbalized understanding   Labs/tests ordered: None   Next Appointment: Return if symptoms worsen or fail to improve.   Total time: 33 minutes. Greater than 50% of total time spent doing patient education regarding Chronic pancreatitis,Cholecystitis,Cholelithiasis,chronic pain syndrome,HTN and health maintenance including symptom/medication management.   Roxan JAYSON Plough, NP

## 2024-08-08 NOTE — Telephone Encounter (Signed)
 Letter mailed

## 2024-08-09 ENCOUNTER — Telehealth: Payer: Self-pay

## 2024-08-09 NOTE — Telephone Encounter (Signed)
 Attempted to reach pt line rings and voice mail not set up

## 2024-08-09 NOTE — Telephone Encounter (Signed)
-----   Message from [facility] sent at 08/08/2024  5:30 PM EDT ----- Regarding: RE: Inpatient biopsy HD, Thank you for this update. Since it was felt that maybe he would benefit from cholecystectomy but it is felt to be high risk in this particular area, we can plan to refer him to one of the quaternary centers. I would favor sending him to [facility] since he has seen [facility] in the past as well so [facility] has a MRN that is available. I will have Zafar Debrosse start working on reaching out to the patient to confirm if that is reasonable and okay from his perspective since it is felt that he is not a candidate for surgery in [place]. Thanks. GM  Rekia Kujala, Please reach out to patient as able and see if he is okay with referral to [facility] in [facility]. GM ----- Message ----- From: Wilhelmenia Aloha Raddle., MD Sent: 08/08/2024   5:31 PM EDT To: Odetta LITTIE Curly, RN; Victory LITTIE Legrand DOUGLAS, MD Subject: RE: Inpatient biopsy                           HD, Thank you for this update. Since it was felt that maybe he would benefit from cholecystectomy but it is felt to be high risk in this particular area, we can plan to refer him to one of the quaternary centers. I would favor sending him to [facility] since he has seen [facility] in the past as well so [facility] has a MRN that is available. I will have Gaynor Genco start working on reaching out to the patient to confirm if that is reasonable and okay from his perspective since it is felt that he is not a candidate for surgery in [place]. Thanks. GM ----- Message ----- From: Legrand Victory LITTIE DOUGLAS, MD Sent: 08/08/2024   1:30 PM EDT To: Aloha Wilhelmenia Raddle., MD Subject: Inpatient biopsy                               Carlos [name],  This is a clinic patient of yours who has had complications of chronic pancreatitis and endoscopic therapies for that. He was admitted to [facility] last week for a few weeks of escalating right upper quadrant pain, and was  initially seen by An[facility]. Gallstones were discovered that sounded a probable explanation for the pain.  They asked me to do an EGD as purple doc on Saturday, and it was generally unrevealing.  Biopsies negative for H. pylori (send you a copy of the pathology result letter).  I asked Adina Babe to see him in consultation.  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  I asked if the hepatobiliary docs could see him in clinic, and his answer was they would feel the same. I will leave it to you if you wish to communicate with Drs. Allen/Byerly or think about having the man seen at [facility] or wherever.  HD

## 2024-08-09 NOTE — Telephone Encounter (Signed)
Patient returning call. Please advise, thank you

## 2024-08-10 ENCOUNTER — Other Ambulatory Visit: Payer: Self-pay

## 2024-08-10 ENCOUNTER — Other Ambulatory Visit: Payer: Self-pay | Admitting: Neurology

## 2024-08-10 ENCOUNTER — Ambulatory Visit: Payer: Self-pay | Admitting: Family

## 2024-08-10 DIAGNOSIS — M79601 Pain in right arm: Secondary | ICD-10-CM

## 2024-08-10 DIAGNOSIS — M792 Neuralgia and neuritis, unspecified: Secondary | ICD-10-CM

## 2024-08-15 ENCOUNTER — Telehealth: Payer: Self-pay

## 2024-08-15 NOTE — Patient Instructions (Signed)
 Visit Information  Thank you for taking time to visit with me today. Please don't hesitate to contact me if I can be of assistance to you before our next scheduled telephone appointment.  Our next appointment is by telephone on 08/22/24 in the afternoon   Following is a copy of your care plan:   Goals Addressed             This Visit's Progress    VBCI Transitions of Care (TOC) Care Plan       Problems:  Recent Hospitalization for treatment of Diabetic ketoacidosis (DKA) Patient has been unable to get an appointment with [facility] - TOC RN assisted patient and appt completed 08/10/24   Goal:  Over the next 30 days, the patient will not experience hospital readmission  Interventions:  Transitions of Care: Doctor Visits  - discussed the importance of doctor visits  08/15/24 Update: pathology showed No H Pylori - 10/1 PCP patient had swelling sucralfate  was stopped patient states swelling is completely resolved - reviewed that has pain med frequency was decreased to every 8 hours and patient reports pain the same as it was with every 4 hours - patient states he has not heard from [facility] RN advised patient that Odetta was trying to reach him re: being referred to [facility] for cholecystectomy and patient states he will contact Patty at [facility] office  - patient reports sugar 132 via Dexcom - average 176 in the last 7 days - patient states he was seen 10/3 at [facility] - no new medication ordered per patient    Diabetes Interventions: Assessed patient's understanding of A1c goal: <7% Provided education to patient about basic DM disease process Reviewed medications with patient and discussed importance of medication adherence Discussed plans with patient for ongoing care management follow up and provided patient with direct contact information for care management team Reviewed scheduled/upcoming provider appointments including: PCP appointment 08/08/24  Assessed  social determinant of health barriers Lab Results  Component Value Date   HGBA1C 9.3 (H) 08/02/2024    Pain Interventions: Pain assessment performed Medications reviewed Discussed importance of adherence to all scheduled medical appointments Counseled on the importance of reporting any/all new or changed pain symptoms or management strategies to pain management provider Advised patient to report to care team affect of pain on daily activities Patient reported he was referred to [facility] months ago and he has called but does not get a call back - [facility] RN called and spoke with [place] in conference with patient and appointment has now been scheduled for this Friday 08/10/24  Patient Self Care Activities:  Attend all scheduled provider appointments Call pharmacy for medication refills 3-7 days in advance of running out of medications Call provider office for new concerns or questions  Notify RN Care Manager of [facility] call rescheduling needs Participate in Transition of Care Program/Attend TOC scheduled calls Take medications as prescribed   check blood sugar at prescribed times: Monitor via Dexcom check feet daily for cuts, sores or redness take the blood sugar log to all doctor visits manage portion size wash and dry feet carefully every day wear comfortable, cotton socks wear comfortable, well-fitting shoes  Plan:  Telephone follow up appointment with care management team member scheduled for:  08/22/24 in the afternoon The patient has been provided with contact information for the care management team and has been advised to call with any health related questions or concerns.  Patient verbalizes understanding of instructions and care plan provided today and agrees to view in MyChart. Active MyChart status and patient understanding of how to access instructions and care plan via MyChart confirmed with patient.     Telephone follow up appointment with care management  team member scheduled for: 08/22/24 The patient has been provided with contact information for the care management team and has been advised to call with any health related questions or concerns.   Please call the care guide team at [phone] if you need to cancel or reschedule your appointment.   Please call the Suicide and Crisis Lifeline: 988 call 1-800-273-TALK (toll free, 24 hour hotline) call 911 if you are experiencing a Mental Health or Behavioral Health Crisis or need someone to talk to.  Shona Prow RN, CCM [place]  VBCI-Population Health RN Care Manager [phone]

## 2024-08-15 NOTE — Transitions of Care (Post Inpatient/ED Visit) (Signed)
 Transition of Care week 2  Visit Note  08/15/2024  Name: Carlos [name] MRN: [id number]          DOB: 16-Feb-1986  Situation: Patient enrolled in [facility] 30-day program. Visit completed with patient by telephone.   Background: Admit/Discharge Date: 9/24 - 9/27 Darryle Law    Primary Diagnosis: Diabetic ketoacidosis (DKA)  Initial Transition Care Management Follow-up Telephone Call    Past Medical History:  Diagnosis Date   AKI (acute kidney injury) 12/21/2021   Anxiety    Diabetes (HCC)    Gall stones 2025   Pancreatitis    Splenic vein thrombosis     Assessment: Patient Reported Symptoms: Cognitive Cognitive Status: No symptoms reported, Normal speech and language skills, Alert and oriented to person, place, and time      Neurological Neurological Review of Symptoms: No symptoms reported    HEENT        Cardiovascular Cardiovascular Symptoms Reported: No symptoms reported Weight: 150 lb (68 kg)  Respiratory Respiratory Symptoms Reported: No symptoms reported    Endocrine Endocrine Symptoms Reported: No symptoms reported Is patient diabetic?: Yes List most recent blood sugar readings, include date and time of day: sugar 132 via Dexcom - average 176 in the last 7 days Endocrine Self-Management Outcome: 3 (uncertain) Endocrine Comment: patient states he'd like to get his A1C to 7% and states he has been making dietary changes and is taking meds as prescribed  Gastrointestinal Gastrointestinal Symptoms Reported: Abdominal pain or discomfort Additional Gastrointestinal Details: Plan remains for cholecystectomy but record shows [facility] - discussed with patient who is going call Patty with Dr Aloha [name]      Genitourinary Genitourinary Symptoms Reported: No symptoms reported    Integumentary Integumentary Symptoms Reported: No symptoms reported    Musculoskeletal Musculoskelatal Symptoms Reviewed: No symptoms reported        Psychosocial Psychosocial Symptoms Reported: No  symptoms reported         There were no vitals filed for this visit.  Medications Reviewed Today     Reviewed by Lauro Shona LABOR, RN (Registered Nurse) on 08/15/24 at 1422  Med List Status: <None>   Medication Order Taking? Sig Documenting Provider Last Dose Status Informant  acetaminophen  (TYLENOL ) 325 MG tablet [id number] Yes Take 2 tablets (650 mg total) by mouth every 6 (six) hours as needed for mild pain (pain score 1-3) or fever (or Fever >/= 101). Barbarann Nest, MD  Active   carvedilol  (COREG ) 12.5 MG tablet [id number] Yes Take 1 tablet (12.5 mg total) by mouth 2 (two) times daily with a meal. Drusilla Sabas RAMAN, MD  Active Self  Continuous Glucose Receiver [facility] G7 RECEIVER) DEVI [id number] Yes Use as directed.   Active Self  Continuous Glucose Sensor (DEXCOM G7 SENSOR) MISC [id number] Yes Use as directed and change every 10 days.   Active Self  gabapentin  (NEURONTIN ) 100 MG capsule [id number]  Take 1 capsule (100 mg total) by mouth 2 (two) times daily.  Patient not taking: Reported on 08/15/2024   Leigh Venetia CROME, MD  Active Self  insulin  aspart (NOVOLOG  FLEXPEN) 100 UNIT/ML FlexPen [id number] Yes Max daily 30 units [name], Ibtehal Jaralla, MD  Active Self           Med Note (GALARZA RODRIGUEZ, SUSAN A   Tue Mar 27, 2024  6:47 PM)    insulin  glargine (LANTUS ) 100 UNIT/ML Solostar Pen [id number] Yes Inject 12 Units into the skin daily. Barbarann Nest, MD  Active   Insulin  Pen Needle 32G  X 4 MM MISC [id number] Yes 1 Device by Does not apply route in the morning, at noon, in the evening, and at bedtime. [name], Donell Cardinal, MD  Active Self  Insulin  Pen Needle 32G X 4 MM MISC [id number] Yes Inject 1 each into the skin daily. Barbarann Nest, MD  Active   lipase/protease/amylase (CREON ) 36000 UNITS CPEP capsule [id number] Yes Take 1-2 capsules (36,000-72,000 Units total) by mouth See admin instructions. Take 72,000 units by mouth three times a day before meals and 36,000 units with any  snacks [name], Aloha Raddle., MD  Active Self  multivitamin (ONE-A-DAY MEN'S) TABS tablet [id number] Yes Take 1 tablet by mouth daily with breakfast. [name], Dinah C, NP  Active Self  naloxone  (NARCAN ) nasal spray 4 mg/0.1 mL [id number] Yes Place 1 spray into the nose once as needed (Overdose). [provider]  Active Self  oxyCODONE  (OXY IR/ROXICODONE ) 5 MG immediate release tablet [id number] Yes Take 1 tablet (5 mg total) by mouth every 8 (eight) hours as needed for severe pain (pain score 7-10). [name], Dinah C, NP  Active   pantoprazole  (PROTONIX ) 40 MG tablet [id number] Yes Take 1 tablet (40 mg total) by mouth 2 (two) times daily. Barbarann Nest, MD  Active   potassium citrate  (UROCIT-K ) 10 MEQ (1080 MG) SR tablet [id number] Yes Take 1 tablet (10 mEq total) by mouth 2 (two) times daily after a meal.   Active Self            Recommendation:   Continue Current Plan of Care  Follow Up Plan:   Telephone follow up appointment date/time:  08/22/24 in the afternoon   Shona Prow RN, CCM [place]  VBCI-Population Health RN Care Manager [phone]

## 2024-08-15 NOTE — Telephone Encounter (Signed)
 Patient is agreeable to being sent to [facility].

## 2024-08-16 ENCOUNTER — Other Ambulatory Visit (HOSPITAL_BASED_OUTPATIENT_CLINIC_OR_DEPARTMENT_OTHER): Payer: Self-pay

## 2024-08-16 NOTE — Telephone Encounter (Signed)
 Referral form has been faxed to [facility] with records

## 2024-08-17 ENCOUNTER — Other Ambulatory Visit: Payer: Self-pay | Admitting: Medical Genetics

## 2024-08-17 ENCOUNTER — Other Ambulatory Visit (HOSPITAL_BASED_OUTPATIENT_CLINIC_OR_DEPARTMENT_OTHER): Payer: Self-pay

## 2024-08-17 DIAGNOSIS — Z006 Encounter for examination for normal comparison and control in clinical research program: Secondary | ICD-10-CM

## 2024-08-17 MED ORDER — GABAPENTIN 100 MG PO CAPS
100.0000 mg | ORAL_CAPSULE | Freq: Two times a day (BID) | ORAL | 5 refills | Status: AC
  Filled 2024-08-17: qty 60, 30d supply, fill #0
  Filled 2024-09-15: qty 60, 30d supply, fill #1
  Filled 2024-11-05: qty 60, 30d supply, fill #2
  Filled 2024-12-01: qty 60, 30d supply, fill #3

## 2024-08-22 ENCOUNTER — Telehealth: Payer: Self-pay

## 2024-09-06 ENCOUNTER — Other Ambulatory Visit (HOSPITAL_BASED_OUTPATIENT_CLINIC_OR_DEPARTMENT_OTHER): Payer: Self-pay

## 2024-09-06 ENCOUNTER — Other Ambulatory Visit: Payer: Self-pay | Admitting: Family

## 2024-09-06 DIAGNOSIS — K860 Alcohol-induced chronic pancreatitis: Secondary | ICD-10-CM

## 2024-09-06 MED ORDER — OXYCODONE HCL 5 MG PO TABS
5.0000 mg | ORAL_TABLET | Freq: Three times a day (TID) | ORAL | 0 refills | Status: DC | PRN
  Filled 2024-09-06 (×2): qty 90, 30d supply, fill #0

## 2024-09-06 NOTE — Telephone Encounter (Signed)
 Patient is requesting a refill of the following medications: Requested Prescriptions   Pending Prescriptions Disp Refills   oxyCODONE  (OXY IR/ROXICODONE ) 5 MG immediate release tablet 90 tablet 0    Sig: Take 1 tablet (5 mg total) by mouth every 8 (eight) hours as needed for severe pain (pain score 7-10).    Date of last refill: 08/08/24  Refill amount: 90  Treatment agreement date: 08/08/24

## 2024-09-14 ENCOUNTER — Other Ambulatory Visit (HOSPITAL_BASED_OUTPATIENT_CLINIC_OR_DEPARTMENT_OTHER): Payer: Self-pay

## 2024-09-17 ENCOUNTER — Other Ambulatory Visit (HOSPITAL_BASED_OUTPATIENT_CLINIC_OR_DEPARTMENT_OTHER): Payer: Self-pay

## 2024-09-17 ENCOUNTER — Other Ambulatory Visit: Payer: Self-pay

## 2024-10-05 ENCOUNTER — Other Ambulatory Visit (HOSPITAL_BASED_OUTPATIENT_CLINIC_OR_DEPARTMENT_OTHER): Payer: Self-pay

## 2024-10-07 ENCOUNTER — Other Ambulatory Visit: Payer: Self-pay | Admitting: Family

## 2024-10-07 ENCOUNTER — Other Ambulatory Visit (HOSPITAL_BASED_OUTPATIENT_CLINIC_OR_DEPARTMENT_OTHER): Payer: Self-pay

## 2024-10-08 ENCOUNTER — Other Ambulatory Visit: Payer: Self-pay | Admitting: Family

## 2024-10-08 ENCOUNTER — Other Ambulatory Visit (HOSPITAL_BASED_OUTPATIENT_CLINIC_OR_DEPARTMENT_OTHER): Payer: Self-pay

## 2024-10-08 DIAGNOSIS — K860 Alcohol-induced chronic pancreatitis: Secondary | ICD-10-CM

## 2024-10-08 MED ORDER — OXYCODONE HCL 5 MG PO TABS
5.0000 mg | ORAL_TABLET | Freq: Three times a day (TID) | ORAL | 0 refills | Status: DC | PRN
  Filled 2024-10-08: qty 90, 30d supply, fill #0

## 2024-10-08 MED ORDER — ONE-A-DAY MENS PO TABS
1.0000 | ORAL_TABLET | Freq: Every day | ORAL | 1 refills | Status: AC
  Filled 2024-10-08: qty 130, 130d supply, fill #0
  Filled 2024-12-01: qty 130, 130d supply, fill #1

## 2024-10-08 NOTE — Telephone Encounter (Signed)
 Patient is requesting a refill of the following medications: Requested Prescriptions   Pending Prescriptions Disp Refills   oxyCODONE  (OXY IR/ROXICODONE ) 5 MG immediate release tablet 90 tablet 0    Sig: Take 1 tablet (5 mg total) by mouth every 8 (eight) hours as needed for severe pain (pain score 7-10).    Date of last approval: 09/06/24  Refill amount: 90  Treatment agreement date: October 2025

## 2024-10-09 ENCOUNTER — Other Ambulatory Visit (HOSPITAL_BASED_OUTPATIENT_CLINIC_OR_DEPARTMENT_OTHER): Payer: Self-pay

## 2024-11-05 ENCOUNTER — Other Ambulatory Visit (HOSPITAL_BASED_OUTPATIENT_CLINIC_OR_DEPARTMENT_OTHER): Payer: Self-pay

## 2024-11-05 ENCOUNTER — Other Ambulatory Visit: Payer: Self-pay | Admitting: Family

## 2024-11-05 ENCOUNTER — Other Ambulatory Visit: Payer: Self-pay

## 2024-11-05 DIAGNOSIS — K860 Alcohol-induced chronic pancreatitis: Secondary | ICD-10-CM

## 2024-11-05 MED ORDER — OXYCODONE HCL 5 MG PO TABS
5.0000 mg | ORAL_TABLET | Freq: Three times a day (TID) | ORAL | 0 refills | Status: DC | PRN
  Filled 2024-11-05 – 2024-11-06 (×2): qty 90, 30d supply, fill #0

## 2024-11-05 MED ORDER — POTASSIUM CITRATE ER 10 MEQ (1080 MG) PO TBCR
10.0000 meq | EXTENDED_RELEASE_TABLET | Freq: Two times a day (BID) | ORAL | 0 refills | Status: AC
  Filled 2024-11-05: qty 60, 30d supply, fill #0

## 2024-11-05 NOTE — Telephone Encounter (Signed)
 Please call patient to schedule next follow up visit in may,2026 for annual Physical.

## 2024-11-05 NOTE — Telephone Encounter (Signed)
 Patient is requesting a refill of the following medications: Requested Prescriptions   Pending Prescriptions Disp Refills   oxyCODONE  (OXY IR/ROXICODONE ) 5 MG immediate release tablet 90 tablet 0    Sig: Take 1 tablet (5 mg total) by mouth every 8 (eight) hours as needed for severe pain (pain score 7-10).    Date of last refill: 10/08/2024  Refill amount: 90 with 0 refills  Treatment agreement date: 08/2024

## 2024-11-06 ENCOUNTER — Other Ambulatory Visit (HOSPITAL_BASED_OUTPATIENT_CLINIC_OR_DEPARTMENT_OTHER): Payer: Self-pay

## 2024-11-06 NOTE — Telephone Encounter (Signed)
 Patient scheduled.

## 2024-11-06 NOTE — Telephone Encounter (Signed)
 I tried calling patient no voicemail set up to leave message. I did reach patient ER contact and she will relay the message for patient to schedule appointment 03/2025 per Dinah.

## 2024-11-07 ENCOUNTER — Other Ambulatory Visit: Payer: Self-pay

## 2024-11-07 ENCOUNTER — Emergency Department (HOSPITAL_COMMUNITY)

## 2024-11-07 ENCOUNTER — Inpatient Hospital Stay (HOSPITAL_COMMUNITY)
Admission: EM | Admit: 2024-11-07 | Discharge: 2024-11-09 | DRG: 637 | Disposition: A | Discharge status: Home / Self Care | Attending: Internal Medicine | Admitting: Internal Medicine

## 2024-11-07 ENCOUNTER — Encounter (HOSPITAL_COMMUNITY): Payer: Self-pay

## 2024-11-07 DIAGNOSIS — K861 Other chronic pancreatitis: Secondary | ICD-10-CM | POA: Diagnosis not present

## 2024-11-07 DIAGNOSIS — E1040 Type 1 diabetes mellitus with diabetic neuropathy, unspecified: Secondary | ICD-10-CM | POA: Diagnosis present

## 2024-11-07 DIAGNOSIS — K860 Alcohol-induced chronic pancreatitis: Secondary | ICD-10-CM | POA: Diagnosis present

## 2024-11-07 DIAGNOSIS — R7401 Elevation of levels of liver transaminase levels: Secondary | ICD-10-CM | POA: Diagnosis not present

## 2024-11-07 DIAGNOSIS — N179 Acute kidney failure, unspecified: Secondary | ICD-10-CM | POA: Diagnosis present

## 2024-11-07 DIAGNOSIS — E871 Hypo-osmolality and hyponatremia: Secondary | ICD-10-CM | POA: Diagnosis present

## 2024-11-07 DIAGNOSIS — R945 Abnormal results of liver function studies: Secondary | ICD-10-CM

## 2024-11-07 DIAGNOSIS — Z91018 Allergy to other foods: Secondary | ICD-10-CM

## 2024-11-07 DIAGNOSIS — F17290 Nicotine dependence, other tobacco product, uncomplicated: Secondary | ICD-10-CM | POA: Diagnosis present

## 2024-11-07 DIAGNOSIS — Z8659 Personal history of other mental and behavioral disorders: Secondary | ICD-10-CM | POA: Diagnosis not present

## 2024-11-07 DIAGNOSIS — R636 Underweight: Secondary | ICD-10-CM | POA: Diagnosis present

## 2024-11-07 DIAGNOSIS — T383X6A Underdosing of insulin and oral hypoglycemic [antidiabetic] drugs, initial encounter: Secondary | ICD-10-CM | POA: Diagnosis present

## 2024-11-07 DIAGNOSIS — E1010 Type 1 diabetes mellitus with ketoacidosis without coma: Secondary | ICD-10-CM | POA: Diagnosis present

## 2024-11-07 DIAGNOSIS — G9341 Metabolic encephalopathy: Secondary | ICD-10-CM | POA: Diagnosis present

## 2024-11-07 DIAGNOSIS — D72829 Elevated white blood cell count, unspecified: Secondary | ICD-10-CM | POA: Diagnosis not present

## 2024-11-07 DIAGNOSIS — Z681 Body mass index (BMI) 19 or less, adult: Secondary | ICD-10-CM

## 2024-11-07 DIAGNOSIS — Z79899 Other long term (current) drug therapy: Secondary | ICD-10-CM

## 2024-11-07 DIAGNOSIS — F1090 Alcohol use, unspecified, uncomplicated: Secondary | ICD-10-CM | POA: Diagnosis not present

## 2024-11-07 DIAGNOSIS — Z794 Long term (current) use of insulin: Secondary | ICD-10-CM

## 2024-11-07 DIAGNOSIS — E875 Hyperkalemia: Secondary | ICD-10-CM | POA: Diagnosis present

## 2024-11-07 DIAGNOSIS — Z86718 Personal history of other venous thrombosis and embolism: Secondary | ICD-10-CM

## 2024-11-07 DIAGNOSIS — E1110 Type 2 diabetes mellitus with ketoacidosis without coma: Secondary | ICD-10-CM | POA: Diagnosis present

## 2024-11-07 DIAGNOSIS — E876 Hypokalemia: Secondary | ICD-10-CM | POA: Diagnosis present

## 2024-11-07 DIAGNOSIS — I10 Essential (primary) hypertension: Secondary | ICD-10-CM | POA: Diagnosis present

## 2024-11-07 DIAGNOSIS — Z91128 Patient's intentional underdosing of medication regimen for other reason: Secondary | ICD-10-CM | POA: Diagnosis not present

## 2024-11-07 DIAGNOSIS — Z8249 Family history of ischemic heart disease and other diseases of the circulatory system: Secondary | ICD-10-CM

## 2024-11-07 DIAGNOSIS — R4182 Altered mental status, unspecified: Secondary | ICD-10-CM | POA: Diagnosis present

## 2024-11-07 LAB — BASIC METABOLIC PANEL WITH GFR
Anion gap: 26 — ABNORMAL HIGH (ref 5–15)
Anion gap: 21 — ABNORMAL HIGH (ref 5–15)
BUN: 16 mg/dL (ref 6–20)
BUN: 16 mg/dL (ref 6–20)
BUN: 16 mg/dL (ref 6–20)
BUN: 16 mg/dL (ref 6–20)
BUN: 15 mg/dL (ref 6–20)
CO2: <7 mmol/L — ABNORMAL LOW (ref 22–32)
CO2: <7 mmol/L — ABNORMAL LOW (ref 22–32)
CO2: <7 mmol/L — ABNORMAL LOW (ref 22–32)
CO2: 9 mmol/L — ABNORMAL LOW (ref 22–32)
CO2: 14 mmol/L — ABNORMAL LOW (ref 22–32)
Calcium: 9.4 mg/dL (ref 8.9–10.3)
Calcium: 9 mg/dL (ref 8.9–10.3)
Calcium: 9.2 mg/dL (ref 8.9–10.3)
Calcium: 9 mg/dL (ref 8.9–10.3)
Calcium: 9.3 mg/dL (ref 8.9–10.3)
Chloride: 84 mmol/L — ABNORMAL LOW (ref 98–111)
Chloride: 89 mmol/L — ABNORMAL LOW (ref 98–111)
Chloride: 93 mmol/L — ABNORMAL LOW (ref 98–111)
Chloride: 95 mmol/L — ABNORMAL LOW (ref 98–111)
Chloride: 94 mmol/L — ABNORMAL LOW (ref 98–111)
Creatinine, Ser: 1.56 mg/dL — ABNORMAL HIGH (ref 0.61–1.24)
Creatinine, Ser: 1.37 mg/dL — ABNORMAL HIGH (ref 0.61–1.24)
Creatinine, Ser: 1.32 mg/dL — ABNORMAL HIGH (ref 0.61–1.24)
Creatinine, Ser: 1.34 mg/dL — ABNORMAL HIGH (ref 0.61–1.24)
Creatinine, Ser: 1.4 mg/dL — ABNORMAL HIGH (ref 0.61–1.24)
GFR, Estimated: 58 mL/min — ABNORMAL LOW
GFR, Estimated: >60 mL/min
GFR, Estimated: >60 mL/min
GFR, Estimated: >60 mL/min
GFR, Estimated: >60 mL/min
Glucose, Bld: 562 mg/dL (ref 70–99)
Glucose, Bld: 395 mg/dL — ABNORMAL HIGH (ref 70–99)
Glucose, Bld: 141 mg/dL — ABNORMAL HIGH (ref 70–99)
Glucose, Bld: 174 mg/dL — ABNORMAL HIGH (ref 70–99)
Glucose, Bld: 195 mg/dL — ABNORMAL HIGH (ref 70–99)
Potassium: 5.2 mmol/L — ABNORMAL HIGH (ref 3.5–5.1)
Potassium: 4.4 mmol/L (ref 3.5–5.1)
Potassium: 4.3 mmol/L (ref 3.5–5.1)
Potassium: 4.5 mmol/L (ref 3.5–5.1)
Potassium: 4 mmol/L (ref 3.5–5.1)
Sodium: 126 mmol/L — ABNORMAL LOW (ref 135–145)
Sodium: 126 mmol/L — ABNORMAL LOW (ref 135–145)
Sodium: 131 mmol/L — ABNORMAL LOW (ref 135–145)
Sodium: 130 mmol/L — ABNORMAL LOW (ref 135–145)
Sodium: 129 mmol/L — ABNORMAL LOW (ref 135–145)

## 2024-11-07 LAB — CBC
HCT: 37.4 % — ABNORMAL LOW (ref 39.0–52.0)
Hemoglobin: 13.4 g/dL (ref 13.0–17.0)
MCH: 35.7 pg — ABNORMAL HIGH (ref 26.0–34.0)
MCHC: 35.8 g/dL (ref 30.0–36.0)
MCV: 99.7 fL (ref 80.0–100.0)
Platelets: 83 K/uL — ABNORMAL LOW (ref 150–400)
RBC: 3.75 MIL/uL — ABNORMAL LOW (ref 4.22–5.81)
RDW: 10.9 % — ABNORMAL LOW (ref 11.5–15.5)
WBC: 12.8 K/uL — ABNORMAL HIGH (ref 4.0–10.5)
nRBC: 0 % (ref 0.0–0.2)

## 2024-11-07 LAB — CBG MONITORING, ED
Glucose-Capillary: 521 mg/dL (ref 70–99)
Glucose-Capillary: 556 mg/dL (ref 70–99)
Glucose-Capillary: 500 mg/dL — ABNORMAL HIGH (ref 70–99)
Glucose-Capillary: 515 mg/dL (ref 70–99)
Glucose-Capillary: 425 mg/dL — ABNORMAL HIGH (ref 70–99)
Glucose-Capillary: 488 mg/dL — ABNORMAL HIGH (ref 70–99)
Glucose-Capillary: 352 mg/dL — ABNORMAL HIGH (ref 70–99)
Glucose-Capillary: 303 mg/dL — ABNORMAL HIGH (ref 70–99)
Glucose-Capillary: 214 mg/dL — ABNORMAL HIGH (ref 70–99)

## 2024-11-07 LAB — CBC WITH DIFFERENTIAL/PLATELET
Abs Immature Granulocytes: 0.21 K/uL — ABNORMAL HIGH (ref 0.00–0.07)
Basophils Absolute: 0 K/uL (ref 0.0–0.1)
Basophils Relative: 0 %
Eosinophils Absolute: 0 K/uL (ref 0.0–0.5)
Eosinophils Relative: 0 %
HCT: 50 % (ref 39.0–52.0)
Hemoglobin: 16.3 g/dL (ref 13.0–17.0)
Immature Granulocytes: 2 %
Lymphocytes Relative: 5 %
Lymphs Abs: 0.6 K/uL — ABNORMAL LOW (ref 0.7–4.0)
MCH: 35.7 pg — ABNORMAL HIGH (ref 26.0–34.0)
MCHC: 32.6 g/dL (ref 30.0–36.0)
MCV: 109.4 fL — ABNORMAL HIGH (ref 80.0–100.0)
Monocytes Absolute: 0.8 K/uL (ref 0.1–1.0)
Monocytes Relative: 7 %
Neutro Abs: 10.3 K/uL — ABNORMAL HIGH (ref 1.7–7.7)
Neutrophils Relative %: 86 %
Platelets: 157 K/uL (ref 150–400)
RBC: 4.57 MIL/uL (ref 4.22–5.81)
RDW: 11.1 % — ABNORMAL LOW (ref 11.5–15.5)
WBC: 12 K/uL — ABNORMAL HIGH (ref 4.0–10.5)
nRBC: 0.2 % (ref 0.0–0.2)

## 2024-11-07 LAB — I-STAT CHEM 8, ED
BUN: 24 mg/dL — ABNORMAL HIGH (ref 6–20)
Calcium, Ion: 1.03 mmol/L — ABNORMAL LOW (ref 1.15–1.40)
Chloride: 102 mmol/L (ref 98–111)
Creatinine, Ser: 1.1 mg/dL (ref 0.61–1.24)
Glucose, Bld: 531 mg/dL (ref 70–99)
HCT: 53 % — ABNORMAL HIGH (ref 39.0–52.0)
Hemoglobin: 18 g/dL — ABNORMAL HIGH (ref 13.0–17.0)
Potassium: 6.9 mmol/L (ref 3.5–5.1)
Sodium: 123 mmol/L — ABNORMAL LOW (ref 135–145)
TCO2: 5 mmol/L — ABNORMAL LOW (ref 22–32)

## 2024-11-07 LAB — BLOOD GAS, VENOUS
Acid-base deficit: 29.3 mmol/L — ABNORMAL HIGH (ref 0.0–2.0)
Bicarbonate: 3.5 mmol/L — ABNORMAL LOW (ref 20.0–28.0)
O2 Saturation: 79.2 %
Patient temperature: 37
pCO2, Ven: 20 mmHg — ABNORMAL LOW (ref 44–60)
pH, Ven: <6.95 — CL (ref 7.25–7.43)
pO2, Ven: 47 mmHg — ABNORMAL HIGH (ref 32–45)

## 2024-11-07 LAB — HEPATIC FUNCTION PANEL
ALT: 85 U/L — ABNORMAL HIGH (ref 0–44)
AST: 98 U/L — ABNORMAL HIGH (ref 15–41)
Albumin: 5 g/dL (ref 3.5–5.0)
Alkaline Phosphatase: 226 U/L — ABNORMAL HIGH (ref 38–126)
Bilirubin, Direct: 0.3 mg/dL — ABNORMAL HIGH (ref 0.0–0.2)
Indirect Bilirubin: 0.3 mg/dL (ref 0.3–0.9)
Total Bilirubin: 0.6 mg/dL (ref 0.0–1.2)
Total Protein: 8.7 g/dL — ABNORMAL HIGH (ref 6.5–8.1)

## 2024-11-07 LAB — GLUCOSE, CAPILLARY
Glucose-Capillary: 128 mg/dL — ABNORMAL HIGH (ref 70–99)
Glucose-Capillary: 119 mg/dL — ABNORMAL HIGH (ref 70–99)
Glucose-Capillary: 134 mg/dL — ABNORMAL HIGH (ref 70–99)
Glucose-Capillary: 176 mg/dL — ABNORMAL HIGH (ref 70–99)
Glucose-Capillary: 169 mg/dL — ABNORMAL HIGH (ref 70–99)
Glucose-Capillary: 176 mg/dL — ABNORMAL HIGH (ref 70–99)
Glucose-Capillary: 197 mg/dL — ABNORMAL HIGH (ref 70–99)
Glucose-Capillary: 186 mg/dL — ABNORMAL HIGH (ref 70–99)
Glucose-Capillary: 182 mg/dL — ABNORMAL HIGH (ref 70–99)

## 2024-11-07 LAB — URINALYSIS, ROUTINE W REFLEX MICROSCOPIC
Bilirubin Urine: NEGATIVE
Glucose, UA: >=500 mg/dL — AB
Ketones, ur: 80 mg/dL — AB
Leukocytes,Ua: NEGATIVE
Nitrite: NEGATIVE
Protein, ur: 30 mg/dL — AB
Specific Gravity, Urine: 1.012 (ref 1.005–1.030)
pH: 5 (ref 5.0–8.0)

## 2024-11-07 LAB — URINE DRUG SCREEN
Amphetamines: NEGATIVE
Barbiturates: NEGATIVE
Benzodiazepines: NEGATIVE
Cocaine: NEGATIVE
Fentanyl: NEGATIVE
Methadone Scn, Ur: NEGATIVE
Opiates: NEGATIVE
Tetrahydrocannabinol: NEGATIVE

## 2024-11-07 LAB — BETA-HYDROXYBUTYRIC ACID
Beta-Hydroxybutyric Acid: >8 mmol/L — ABNORMAL HIGH (ref 0.05–0.27)
Beta-Hydroxybutyric Acid: >8 mmol/L — ABNORMAL HIGH (ref 0.05–0.27)
Beta-Hydroxybutyric Acid: >8 mmol/L — ABNORMAL HIGH (ref 0.05–0.27)
Beta-Hydroxybutyric Acid: >8 mmol/L — ABNORMAL HIGH (ref 0.05–0.27)

## 2024-11-07 LAB — MRSA NEXT GEN BY PCR, NASAL: MRSA by PCR Next Gen: NOT DETECTED

## 2024-11-07 LAB — LIPASE, BLOOD: Lipase: 38 U/L (ref 11–51)

## 2024-11-07 MED ORDER — SODIUM CHLORIDE 0.9 % IV SOLN: INTRAVENOUS | Status: DC

## 2024-11-07 MED ORDER — SENNA 8.6 MG PO TABS: 1.0000 | ORAL_TABLET | Freq: Two times a day (BID) | ORAL | Status: DC | PRN

## 2024-11-07 MED ORDER — DEXTROSE IN LACTATED RINGERS 5 % IV SOLN: INTRAVENOUS | Status: AC

## 2024-11-07 MED ORDER — POLYETHYLENE GLYCOL 3350 17 G PO PACK: 17.0000 g | PACK | Freq: Every day | ORAL | Status: DC | PRN

## 2024-11-07 MED ORDER — HYDROMORPHONE HCL 1 MG/ML IJ SOLN
0.5000 mg | Freq: Once | INTRAMUSCULAR | Status: AC
  Administered 2024-11-07: 0.5 mg via INTRAVENOUS
  Filled 2024-11-07: qty 1

## 2024-11-07 MED ORDER — OXYCODONE HCL 5 MG PO TABS
5.0000 mg | ORAL_TABLET | Freq: Four times a day (QID) | ORAL | Status: DC | PRN
  Administered 2024-11-07 – 2024-11-09 (×6): 5 mg via ORAL
  Filled 2024-11-07 (×6): qty 1

## 2024-11-07 MED ORDER — CHLORHEXIDINE GLUCONATE CLOTH 2 % EX PADS
6.0000 | MEDICATED_PAD | Freq: Every day | CUTANEOUS | Status: DC
  Administered 2024-11-07 – 2024-11-08 (×2): 6 via TOPICAL

## 2024-11-07 MED ORDER — INSULIN REGULAR(HUMAN) IN NACL 100-0.9 UT/100ML-% IV SOLN
INTRAVENOUS | Status: AC
  Administered 2024-11-07: 10 [IU]/h via INTRAVENOUS
  Administered 2024-11-08: 2.2 [IU]/h via INTRAVENOUS
  Filled 2024-11-07 (×2): qty 100

## 2024-11-07 MED ORDER — OXYCODONE HCL 5 MG PO TABS
5.0000 mg | ORAL_TABLET | Freq: Three times a day (TID) | ORAL | Status: DC | PRN
  Administered 2024-11-07: 5 mg via ORAL
  Filled 2024-11-07: qty 1

## 2024-11-07 MED ORDER — ORAL CARE MOUTH RINSE: 15.0000 mL | OROMUCOSAL | Status: DC | PRN

## 2024-11-07 MED ORDER — LACTATED RINGERS IV BOLUS
2000.0000 mL | Freq: Once | INTRAVENOUS | Status: AC
  Administered 2024-11-07: 2000 mL via INTRAVENOUS

## 2024-11-07 MED ORDER — LACTATED RINGERS IV BOLUS
1000.0000 mL | Freq: Once | INTRAVENOUS | Status: AC
  Administered 2024-11-07: 1000 mL via INTRAVENOUS

## 2024-11-07 MED ORDER — LACTATED RINGERS IV SOLN: INTRAVENOUS | Status: DC

## 2024-11-07 MED ORDER — DEXTROSE 50 % IV SOLN: 0.0000 mL | INTRAVENOUS | Status: DC | PRN

## 2024-11-07 MED ORDER — PANTOPRAZOLE SODIUM 40 MG IV SOLR
40.0000 mg | Freq: Once | INTRAVENOUS | Status: AC
  Administered 2024-11-07: 40 mg via INTRAVENOUS
  Filled 2024-11-07: qty 10

## 2024-11-07 MED ORDER — ACETAMINOPHEN 500 MG PO TABS
1000.0000 mg | ORAL_TABLET | Freq: Four times a day (QID) | ORAL | Status: DC | PRN
  Administered 2024-11-07 – 2024-11-08 (×4): 1000 mg via ORAL
  Filled 2024-11-07 (×4): qty 2

## 2024-11-07 MED ORDER — HEPARIN SODIUM (PORCINE) 5000 UNIT/ML IJ SOLN
5000.0000 [IU] | Freq: Three times a day (TID) | INTRAMUSCULAR | Status: DC
  Administered 2024-11-07 – 2024-11-09 (×5): 5000 [IU] via SUBCUTANEOUS
  Filled 2024-11-07 (×6): qty 1

## 2024-11-07 MED ORDER — CALCIUM GLUCONATE-NACL 1-0.675 GM/50ML-% IV SOLN
1.0000 g | Freq: Once | INTRAVENOUS | Status: AC
  Administered 2024-11-07: 1000 mg via INTRAVENOUS
  Filled 2024-11-07: qty 50

## 2024-11-07 MED ORDER — HYDROMORPHONE HCL 1 MG/ML IJ SOLN
1.0000 mg | Freq: Once | INTRAMUSCULAR | Status: AC
  Administered 2024-11-07: 1 mg via INTRAVENOUS
  Filled 2024-11-07: qty 1

## 2024-11-07 NOTE — Progress Notes (Signed)
 eLink Physician-Brief Progress Note Patient Name: Carlos [name] DOB: Jun 10, 1986 MRN: [id number]   Date of Service  11/07/2024  HPI/Events of Note  Abdominal pain.  Patient with abdominal pain from acute pancreatitis.  Currently very uncomfortable with tachycardia and hypotension.  On oxycodone  every 8 hours.  eICU Interventions  Will give a one-time dose of IV hydromorphone  and then change his oxycodone  from every 8 hours as needed to every 6 hours as needed for better pain control.  Discussed with RN.     Intervention Category Intermediate Interventions: Abdominal pain - evaluation and management  Jerilynn Berg 11/07/2024, 7:48 PM

## 2024-11-07 NOTE — H&P (Signed)
 "  NAME:  Carlos [name], MRN:  [id number], DOB:  1986/09/12, LOS: 0 ADMISSION DATE:  11/07/2024, CONSULTATION DATE:  12/31 REFERRING MD:  Freddi, EDP, CHIEF COMPLAINT:  DKA   History of Present Illness:  38 year old male with past medical history of anxiety, type 1 diabetes who presented to [facility] ED 12/31 with DKA.   Wife provided history in ED who reported patient has not taken his insulin  in 1 week. She noticed a few days of fruity breath, polydipsia and increased work of breathing. Today, he was more confused which prompted her to bring him to the ED. Afebrile, lethargic. Labs notable for WBC 12, Na 126, K 5.2, Cl 84, bicarb <7, glucose 562, sCr 1.56, AG non-calculable. Venous gas with pH <6.95, pCO2 20, pO2 47, bicarb 3.5, AST 98, ALT 85, alk phos 226, BHB sent and pending, CXR negative for acute findings. He was given 2L IVF bolus, 1g calcium  gluconate and started on insulin  drip. CCM consulted for admission.   Pertinent  Medical History  Anxiety, T1DM  Significant Hospital Events: Including procedures, antibiotic start and stop dates in addition to other pertinent events   12/31: admit for DKA   Interim History / Subjective:   Patient not feeling well in general. Reports central chest/stomach pain  Objective   Blood pressure (!) 159/95, pulse 73, temperature 97.7 F (36.5 C), temperature source Oral, resp. rate (!) 23, height 6' 5 (1.956 m), weight 68 kg, SpO2 100%.       No intake or output data in the 24 hours ending 11/07/24 1050 Filed Weights   11/07/24 0740  Weight: 68 kg    Examination: General: young male, moderate distress, laying in bed HENT: [place]/AT, dry mucous membranes Lungs: clear to auscultation, no wheezing Cardiovascular: tachycardic Abdomen: soft, non-tender, non-distended Extremities: warm, no edema Neuro: alert, oriented, moving all extremities GU: n/a  Resolved Hospital Problem list    Assessment & Plan:  DKA  Type 1 diabetes  AGMA  Not taking insulin   for 1 week d/t inability to check glucose at home -insulin  per endotool -CBG monitoring per protocol  -Aggressive IV fluid resuscitation -trend bmp including bicarb, AG, and BHB  -check A1c   Pseudohyponatremia 2/2 hyperglycemia  Hyperkalemia  - suspect these will correct with correction of DKA  - received 1g calcium  gluconate in ED  - trend   Elevated LFTs   Leukocytosis  Suspect reactive to DKA. Afebrile, otherwise no infectious symptoms.  - trend  Chronic pancreatitis 2/2 chronic alcohol use   appears he takes Creon  at home  - can restart enzymes once taking PO  Hypertension  Carvedilol  12.5mg  BID at home  - if taking PO can restart  - can use prn hydralazine  if needed for sBP or greater  History of anxiety  - no home meds  - monitor   Neuropathy  - takes gabapentin  at hold, can restart when mentation, DKA improves   Labs   CBC: Recent Labs  Lab 11/07/24 0736 11/07/24 0757  WBC 12.0*  --   NEUTROABS 10.3*  --   HGB 16.3 18.0*  HCT 50.0 53.0*  MCV 109.4*  --   PLT 157  --     Basic Metabolic Panel: Recent Labs  Lab 11/07/24 0736 11/07/24 0757  NA 126* 123*  K 5.2* 6.9*  CL 84* 102  CO2 <7*  --   GLUCOSE 562* 531*  BUN 16 24*  CREATININE 1.56* 1.10  CALCIUM  9.4  --  GFR: Estimated Creatinine Clearance: 87.6 mL/min (by C-G formula based on SCr of 1.1 mg/dL). Recent Labs  Lab 11/07/24 0736  WBC 12.0*    Liver Function Tests: Recent Labs  Lab 11/07/24 0915  AST 98*  ALT 85*  ALKPHOS 226*  BILITOT 0.6  PROT 8.7*  ALBUMIN  5.0   Recent Labs  Lab 11/07/24 0915  LIPASE 38   No results for input(s): AMMONIA in the last 168 hours.  ABG    Component Value Date/Time   PHART 7.499 (H) 09/30/2020 1936   PCO2ART 32.6 09/30/2020 1936   PO2ART 71 (L) 09/30/2020 1936   HCO3 3.5 (L) 11/07/2024 0915   TCO2 5 (L) 11/07/2024 0757   ACIDBASEDEF 29.3 (H) 11/07/2024 0915   O2SAT 79.2 11/07/2024 0915     Coagulation Profile: No  results for input(s): INR, PROTIME in the last 168 hours.  Cardiac Enzymes: No results for input(s): CKTOTAL, CKMB, CKMBINDEX, TROPONINI in the last 168 hours.  HbA1C: Hemoglobin A1C  Date/Time Value Ref Range Status  09/19/2023 02:20 PM 7.9 (A) 4.0 - 5.6 % Final   Hgb A1c MFr Bld  Date/Time Value Ref Range Status  08/02/2024 03:05 AM 9.3 (H) 4.8 - 5.6 % Final    Comment:    (NOTE) Diagnosis of Diabetes The following HbA1c ranges recommended by the American Diabetes Association (ADA) may be used as an aid in the diagnosis of diabetes mellitus.  Hemoglobin             Suggested A1C NGSP%              Diagnosis  <5.7                   Non Diabetic  5.7-6.4                Pre-Diabetic  >6.4                   Diabetic  <7.0                   Glycemic control for                       adults with diabetes.    07/06/2023 09:23 AM 8.3 (H) <5.7 % of total Hgb Final    Comment:    For someone without known diabetes, a hemoglobin A1c value of 6.5% or greater indicates that they may have  diabetes and this should be confirmed with a follow-up  test. . For someone with known diabetes, a value <7% indicates  that their diabetes is well controlled and a value  greater than or equal to 7% indicates suboptimal  control. A1c targets should be individualized based on  duration of diabetes, age, comorbid conditions, and  other considerations. . Currently, no consensus exists regarding use of hemoglobin A1c for diagnosis of diabetes for children. .     CBG: Recent Labs  Lab 11/07/24 0730 11/07/24 0910 11/07/24 0941 11/07/24 1011 11/07/24 1039  GLUCAP 521* 556* 515* 500* 488*    Review of Systems:   As above   Past Medical History:  He,  has a past medical history of AKI (acute kidney injury) (12/21/2021), Anxiety, Diabetes (HCC), Gall stones (2025), Pancreatitis, and Splenic vein thrombosis.   Surgical History:   Past Surgical History:  Procedure  Laterality Date   BILIARY BRUSHING  02/02/2023   Procedure: BILIARY BRUSHING;  Surgeon: Wilhelmenia Aloha Raddle., MD;  Location:  [facility]OSCOPY;  Service: Gastroenterology;;   BILIARY BRUSHING  04/28/2023   Procedure: BILIARY BRUSHING;  Surgeon: Aneita Gwendlyn DASEN, MD;  Location[facility] ENDOSCOPY;  Service: Gastroenterology;;   BILIARY BRUSHING  06/02/2023   Procedure: BILIARY BRUSHING;  Surgeon: Wilhelmenia Aloha Raddle., MD;  Location: [facility];  Service: Gastroenterology;;   BILIARY BRUSHING  01/05/2024   Procedure: BILIARY BRUSHING;  Surgeon: Wilhelmenia Aloha Raddle., MD;  Location: [facility];  Service: Gastroenterology;;   BILIARY DILATION  12/01/2022   Procedure: BILIARY DILATION;  Surgeon: Wilhelmenia Aloha Raddle., MD;  Location: [facility];  Service: Gastroenterology;;   BILIARY DILATION  02/02/2023   Procedure: BILIARY DILATION;  Surgeon: Wilhelmenia Aloha Raddle., MD;  Location: [facility];  Service: Gastroenterology;;   BILIARY DILATION  01/05/2024   Procedure: BILIARY DILATION;  Surgeon: Wilhelmenia Aloha Raddle., MD;  Location: [facility];  Service: Gastroenterology;;   BILIARY STENT PLACEMENT  04/28/2023   Procedure: BILIARY STENT PLACEMENT;  Surgeon: Aneita Gwendlyn DASEN, MD;  Location: [facility];  Service: Gastroenterology;;   BILIARY STENT PLACEMENT N/A 06/02/2023   Procedure: BILIARY STENT PLACEMENT;  Surgeon: Wilhelmenia Aloha Raddle., MD;  Location: [facility];  Service: Gastroenterology;  Laterality: N/A;   BIOPSY  12/02/2021   Procedure: BIOPSY;  Surgeon: Wilhelmenia Aloha Raddle., MD;  Location: [facility];  Service: Gastroenterology;;   ENDOSCOPIC RETROGRADE CHOLANGIOPANCREATOGRAPHY (ERCP) WITH PROPOFOL  N/A 08/12/2022   Procedure: ENDOSCOPIC RETROGRADE CHOLANGIOPANCREATOGRAPHY (ERCP) WITH PROPOFOL ;  Surgeon: Wilhelmenia Aloha Raddle., MD;  Location: [facility];  Service: Gastroenterology;  Laterality: N/A;   ENDOSCOPIC RETROGRADE CHOLANGIOPANCREATOGRAPHY (ERCP) WITH PROPOFOL   N/A 12/01/2022   Procedure: ENDOSCOPIC RETROGRADE CHOLANGIOPANCREATOGRAPHY (ERCP) WITH PROPOFOL ;  Surgeon: Wilhelmenia Aloha Raddle., MD;  Location: [facility];  Service: Gastroenterology;  Laterality: N/A;   ENDOSCOPIC RETROGRADE CHOLANGIOPANCREATOGRAPHY (ERCP) WITH PROPOFOL  N/A 02/02/2023   Procedure: ENDOSCOPIC RETROGRADE CHOLANGIOPANCREATOGRAPHY (ERCP) WITH PROPOFOL ;  Surgeon: Wilhelmenia Aloha Raddle., MD;  Location: [facility];  Service: Gastroenterology;  Laterality: N/A;   ENDOSCOPIC RETROGRADE CHOLANGIOPANCREATOGRAPHY (ERCP) WITH PROPOFOL  N/A 06/02/2023   Procedure: ENDOSCOPIC RETROGRADE CHOLANGIOPANCREATOGRAPHY (ERCP) WITH PROPOFOL ;  Surgeon: Wilhelmenia Aloha Raddle., MD;  Location: [facility];  Service: Gastroenterology;  Laterality: N/A;   ENDOSCOPIC RETROGRADE CHOLANGIOPANCREATOGRAPHY (ERCP) WITH PROPOFOL  N/A 01/05/2024   Procedure: ENDOSCOPIC RETROGRADE CHOLANGIOPANCREATOGRAPHY (ERCP) WITH PROPOFOL ;  Surgeon: Wilhelmenia Aloha Raddle., MD;  Location: [facility];  Service: Gastroenterology;  Laterality: N/A;   ERCP N/A 04/28/2023   Procedure: ENDOSCOPIC RETROGRADE CHOLANGIOPANCREATOGRAPHY (ERCP);  Surgeon: Aneita Gwendlyn DASEN, MD;  Location: [facility] ENDOSCOPY;  Service: Gastroenterology;  Laterality: N/A;   ESOPHAGOGASTRODUODENOSCOPY N/A 12/01/2022   Procedure: ESOPHAGOGASTRODUODENOSCOPY (EGD);  Surgeon: Wilhelmenia Aloha Raddle., MD;  Location: [facility];  Service: Gastroenterology;  Laterality: N/A;   ESOPHAGOGASTRODUODENOSCOPY N/A 08/04/2024   Procedure: EGD (ESOPHAGOGASTRODUODENOSCOPY);  Surgeon: Legrand Victory LITTIE DOUGLAS, MD;  Location: [facility];  Service: Gastroenterology;  Laterality: N/A;   ESOPHAGOGASTRODUODENOSCOPY (EGD) WITH PROPOFOL  N/A 12/02/2021   Procedure: ESOPHAGOGASTRODUODENOSCOPY (EGD) WITH PROPOFOL ;  Surgeon: Wilhelmenia Aloha Raddle., MD;  Location: [facility];  Service: Gastroenterology;  Laterality: N/A;   ESOPHAGOGASTRODUODENOSCOPY (EGD) WITH PROPOFOL  N/A 07/09/2023    Procedure: ESOPHAGOGASTRODUODENOSCOPY (EGD) WITH PROPOFOL ;  Surgeon: Abran Norleen SAILOR, MD;  Location: [facility];  Service: Gastroenterology;  Laterality: N/A;  needs ercp scope for stent pull   FINE NEEDLE ASPIRATION N/A 12/01/2022   Procedure: FINE NEEDLE ASPIRATION (FNA) LINEAR;  Surgeon: Wilhelmenia Aloha Raddle., MD;  Location: [facility];  Service: Gastroenterology;  Laterality: N/A;   NEUROLYTIC CELIAC PLEXUS  12/01/2022   Procedure: CELIAC PLEXUS BLOCK;  Surgeon: Wilhelmenia,  Aloha Raddle., MD;  Location: [facility];  Service: Gastroenterology;;   PANCREATIC STENT PLACEMENT  08/12/2022   Procedure: PANCREATIC STENT PLACEMENT;  Surgeon: Wilhelmenia Aloha Raddle., MD;  Location: [facility];  Service: Gastroenterology;;   PANCREATIC STENT PLACEMENT  12/01/2022   Procedure: PANCREATIC STENT PLACEMENT;  Surgeon: Wilhelmenia Aloha Raddle., MD;  Location: [facility];  Service: Gastroenterology;;   PANCREATIC STENT PLACEMENT  02/02/2023   Procedure: PANCREATIC STENT PLACEMENT;  Surgeon: Wilhelmenia Aloha Raddle., MD;  Location: [facility];  Service: Gastroenterology;;   PANCREATIC STENT PLACEMENT  06/02/2023   Procedure: PANCREATIC STENT PLACEMENT;  Surgeon: Wilhelmenia Aloha Raddle., MD;  Location: [facility];  Service: Gastroenterology;;   REMOVAL OF STONES  12/01/2022   Procedure: REMOVAL OF STONES;  Surgeon: Wilhelmenia Aloha Raddle., MD;  Location: [facility];  Service: Gastroenterology;;   REMOVAL OF STONES  02/02/2023   Procedure: REMOVAL OF STONES;  Surgeon: Wilhelmenia Aloha Raddle., MD;  Location: [facility];  Service: Gastroenterology;;   REMOVAL OF STONES  06/02/2023   Procedure: REMOVAL OF STONES;  Surgeon: Wilhelmenia Aloha Raddle., MD;  Location: [facility];  Service: Gastroenterology;;   REMOVAL OF STONES  01/05/2024   Procedure: REMOVAL OF SLUDGE;  Surgeon: Wilhelmenia Aloha Raddle., MD;  Location: [facility];  Service: Gastroenterology;;   ANNETT  08/12/2022   Procedure:  ANNETT;  Surgeon: Wilhelmenia Aloha Raddle., MD;  Location: [facility];  Service: Gastroenterology;;   ANNETT  12/01/2022   Procedure: ANNETT;  Surgeon: [name], Aloha Raddle., MD;  Location: [facility];  Service: Gastroenterology;;   ANNETT  06/02/2023   Procedure: ANNETT;  Surgeon: Wilhelmenia Aloha Raddle., MD;  Location: [facility];  Service: Gastroenterology;;   CLEDA REMOVAL  12/01/2022   Procedure: STENT REMOVAL;  Surgeon: Wilhelmenia Aloha Raddle., MD;  Location: [facility];  Service: Gastroenterology;;   CLEDA REMOVAL  02/02/2023   Procedure: STENT REMOVAL;  Surgeon: Wilhelmenia Aloha Raddle., MD;  Location: [facility];  Service: Gastroenterology;;   CLEDA REMOVAL  06/02/2023   Procedure: STENT REMOVAL;  Surgeon: Wilhelmenia Aloha Raddle., MD;  Location: [facility];  Service: Gastroenterology;;   CLEDA REMOVAL  07/09/2023   Procedure: STENT REMOVAL;  Surgeon: Abran Norleen SAILOR, MD;  Location: [facility];  Service: Gastroenterology;;   CLEDA REMOVAL  01/05/2024   Procedure: STENT REMOVAL;  Surgeon: Wilhelmenia Aloha Raddle., MD;  Location: [facility];  Service: Gastroenterology;;   UPPER ESOPHAGEAL ENDOSCOPIC ULTRASOUND (EUS) N/A 12/02/2021   Procedure: UPPER ESOPHAGEAL ENDOSCOPIC ULTRASOUND (EUS);  Surgeon: Wilhelmenia Aloha Raddle., MD;  Location: [facility];  Service: Gastroenterology;  Laterality: N/A;   UPPER ESOPHAGEAL ENDOSCOPIC ULTRASOUND (EUS) N/A 12/01/2022   Procedure: UPPER ESOPHAGEAL ENDOSCOPIC ULTRASOUND (EUS);  Surgeon: Wilhelmenia Aloha Raddle., MD;  Location: [facility];  Service: Gastroenterology;  Laterality: N/A;     Social History:   reports that he has been smoking cigars. He started smoking about 22 years ago. He has never used smokeless tobacco. He reports that he does not currently use alcohol. He reports that he does not use drugs.   Family History:  His family history includes Alcoholism in his maternal uncle;  Diabetes in his maternal grandfather, maternal grandmother, paternal grandfather, and paternal grandmother; Hypertension in his mother. There is no history of Colon cancer, Esophageal cancer, Inflammatory bowel disease, Liver disease, Pancreatic cancer, Rectal cancer, or Stomach cancer.   Allergies Allergies[1]   Home Medications  Prior to Admission medications  Medication Sig Start Date End Date Taking? Authorizing Provider  acetaminophen  (TYLENOL ) 325 MG tablet Take 2 tablets (  650 mg total) by mouth every 6 (six) hours as needed for mild pain (pain score 1-3) or fever (or Fever >/= 101). 08/04/24   Barbarann Nest, MD  carvedilol  (COREG ) 12.5 MG tablet Take 1 tablet (12.5 mg total) by mouth 2 (two) times daily with a meal. 03/29/24   Drusilla, Sabas RAMAN, MD  Continuous Glucose Receiver (DEXCOM G7 RECEIVER) DEVI Use as directed. 04/05/24     Continuous Glucose Sensor (DEXCOM G7 SENSOR) MISC Use as directed and change every 10 days. 04/05/24     gabapentin  (NEURONTIN ) 100 MG capsule Take 1 capsule (100 mg total) by mouth 2 (two) times daily. 08/17/24   Leigh Venetia CROME, MD  insulin  aspart (NOVOLOG  FLEXPEN) 100 UNIT/ML FlexPen Max daily 30 units 09/19/23   [name], Ibtehal Jaralla, MD  insulin  glargine (LANTUS ) 100 UNIT/ML Solostar Pen Inject 12 Units into the skin daily. 08/04/24   Barbarann Nest, MD  Insulin  Pen Needle 32G X 4 MM MISC 1 Device by Does not apply route in the morning, at noon, in the evening, and at bedtime. 09/19/23   [name], Ibtehal Jaralla, MD  Insulin  Pen Needle 32G X 4 MM MISC Inject 1 each into the skin daily. 08/04/24   Barbarann Nest, MD  lipase/protease/amylase (CREON ) 36000 UNITS CPEP capsule Take 1-2 capsules (36,000-72,000 Units total) by mouth See admin instructions. Take 72,000 units by mouth three times a day before meals and 36,000 units with any snacks 01/25/24   [name], Gabriel Jr., MD  multivitamin (ONE-A-DAY MEN'S) TABS tablet Take 1 tablet by mouth daily with  breakfast. 10/08/24   [name], Dinah C, NP  naloxone  (NARCAN ) nasal spray 4 mg/0.1 mL Place 1 spray into the nose once as needed (Overdose). 04/05/23   [provider]  oxyCODONE  (OXY IR/ROXICODONE ) 5 MG immediate release tablet Take 1 tablet (5 mg total) by mouth every 8 (eight) hours as needed for severe pain (pain score 7-10). 11/05/24   [name], Dinah C, NP  pantoprazole  (PROTONIX ) 40 MG tablet Take 1 tablet (40 mg total) by mouth 2 (two) times daily. 08/04/24   Barbarann Nest, MD  potassium citrate  (UROCIT-K ) 10 MEQ (1080 MG) SR tablet Take 1 tablet (10 mEq total) by mouth 2 (two) times daily after  meals. 11/05/24        Critical care time: 32 minutes    CRITICAL CARE Performed by: Dorn KATHEE Chill   Total critical care time: 32 minutes  Critical care time was exclusive of separately billable procedures and treating other patients.  Critical care was necessary to treat or prevent imminent or life-threatening deterioration.  Critical care was time spent personally by me on the following activities: development of treatment plan with patient and/or surrogate as well as nursing, discussions with consultants, evaluation of patient's response to treatment, examination of patient, obtaining history from patient or surrogate, ordering and performing treatments and interventions, ordering and review of laboratory studies, ordering and review of radiographic studies, pulse oximetry, re-evaluation of patient's condition and participation in multidisciplinary rounds.  Dorn Chill, MD [facility] Office: [phone]   See Amion for personal pager PCCM on call pager [phone] until 7pm. Please call Elink 7p-7a. [phone]          [1]  Allergies Allergen Reactions   Other Anaphylaxis and Other (See Comments)    [name] - anaphylaxis   "

## 2024-11-07 NOTE — ED Notes (Signed)
"  Bare hugger applied.  "

## 2024-11-07 NOTE — ED Provider Triage Note (Signed)
 Emergency Medicine Provider Triage Evaluation Note  Carlos [name] , a 38 y.o. male  was evaluated in triage.  Pt complains of hyperglycemia, family tells me that they have nothing at home to check BG, has not checked since chirstmas, has been administering some insulin , started acting confused, out of it today. Has been breathing hard, thirsty, urinating at lot. Complains of no pain.  Review of Systems  Positive: Hyperglycemia, AMS Negative: Fever  Physical Exam  BP 113/84 (BP Location: Right Arm)   Pulse 88   Temp 97.7 F (36.5 C) (Oral)   Resp (!) 24   SpO2 100%  Gen:   Awake, no distress, confused, follow basic commands   Resp:  Normal effort  MSK:   Moves extremities without difficulty  Other:    Medical Decision Making  Medically screening exam initiated at 7:38 AM.  Appropriate orders placed.  Carlos [name] was informed that the remainder of the evaluation will be completed by another provider, this initial triage assessment does not replace that evaluation, and the importance of remaining in the ED until their evaluation is complete.  Concern for DKA, DKA order set started, patient will go to a room and is not okay to go back to lobby, nursing staff aware and starting line.    Carlos Warren SAILOR, PA-C 11/07/24 [id number]

## 2024-11-07 NOTE — Inpatient Diabetes Management (Addendum)
 Inpatient Diabetes Program Recommendations  AACE/ADA: New Consensus Statement on Inpatient Glycemic Control (2015)  Target Ranges:  Prepandial:   less than 140 mg/dL      Peak postprandial:   less than 180 mg/dL (1-2 hours)      Critically ill patients:  140 - 180 mg/dL   Lab Results  Component Value Date   GLUCAP 303 (H) 11/07/2024   HGBA1C 9.3 (H) 08/02/2024    Review of Glycemic Control  Diabetes history: DM1 Outpatient Diabetes medications: Lantus  16 daily, Novolog  5 TID with meals + CF of 50 with goal of 130 mg/dL, Dexcom G7 CGM Current orders for Inpatient glycemic control: IV insulin  per [facility] for DKA  Endo- [name] HgbA1C - 9.3%  Inpatient Diabetes Program Recommendations:    Continue IV insulin  until criteria met for discontinuation of drip.  Give Semglee  1-2 hours before drip is d/ced  Semglee  12 units Q24H  Novolog  0-9 TID with meals and 0-5 HS  Novolog  3-4 units TID with meals if eating > 50%  See pt regarding his HgbA1C of 9.3% when approp.  Follow.  Thank you. Shona Brandy, RD, LDN, CDCES Inpatient Diabetes Coordinator [phone]

## 2024-11-07 NOTE — Telephone Encounter (Signed)
 Noted

## 2024-11-07 NOTE — ED Triage Notes (Signed)
 Patients family noticed that patient was breathing fast and his breath smelled fruity since last night. Type 1 diabetes. [place] he took shots today.

## 2024-11-07 NOTE — ED Notes (Signed)
 Oral tempeture unable to read, rectal tempeture obtain - 89.5 F. Provider made aware.

## 2024-11-07 NOTE — ED Provider Notes (Signed)
 " [place] EMERGENCY DEPARTMENT AT [facility] Provider Note   CSN: [id number] Arrival date & time: 11/07/24  9341     Patient presents with: Hyperglycemia   Carlos [name] is a 38 y.o. male.   HPI 38 year old male presents with concern for DKA.  His fiance at the bedside provides most of the history.  Patient is a type I diabetic but due to having anyway to check his glucose he has not been taking his insulin  since about 1 week ago.  Over the last couple days has had fruity smelling breath, increased thirst, and increased work of breathing.  She noticed that he seems more lethargic/confused today and so she brought him into the hospital.  Patient is unable to provide much history.  Prior to Admission medications  Medication Sig Start Date End Date Taking? Authorizing Provider  acetaminophen  (TYLENOL ) 325 MG tablet Take 2 tablets (650 mg total) by mouth every 6 (six) hours as needed for mild pain (pain score 1-3) or fever (or Fever >/= 101). 08/04/24   Barbarann Nest, MD  carvedilol  (COREG ) 12.5 MG tablet Take 1 tablet (12.5 mg total) by mouth 2 (two) times daily with a meal. 03/29/24   Drusilla, Sabas RAMAN, MD  Continuous Glucose Receiver (DEXCOM G7 RECEIVER) DEVI Use as directed. 04/05/24     Continuous Glucose Sensor (DEXCOM G7 SENSOR) MISC Use as directed and change every 10 days. 04/05/24     gabapentin  (NEURONTIN ) 100 MG capsule Take 1 capsule (100 mg total) by mouth 2 (two) times daily. 08/17/24   Leigh Venetia CROME, MD  insulin  aspart (NOVOLOG  FLEXPEN) 100 UNIT/ML FlexPen Max daily 30 units 09/19/23   [name], Ibtehal Jaralla, MD  insulin  glargine (LANTUS ) 100 UNIT/ML Solostar Pen Inject 12 Units into the skin daily. 08/04/24   Barbarann Nest, MD  Insulin  Pen Needle 32G X 4 MM MISC 1 Device by Does not apply route in the morning, at noon, in the evening, and at bedtime. 09/19/23   [name], Ibtehal Jaralla, MD  Insulin  Pen Needle 32G X 4 MM MISC Inject 1 each into the skin daily.  08/04/24   Barbarann Nest, MD  lipase/protease/amylase (CREON ) 36000 UNITS CPEP capsule Take 1-2 capsules (36,000-72,000 Units total) by mouth See admin instructions. Take 72,000 units by mouth three times a day before meals and 36,000 units with any snacks 01/25/24   [name], Gabriel Jr., MD  multivitamin (ONE-A-DAY MEN'S) TABS tablet Take 1 tablet by mouth daily with breakfast. 10/08/24   [name], Dinah C, NP  naloxone  (NARCAN ) nasal spray 4 mg/0.1 mL Place 1 spray into the nose once as needed (Overdose). 04/05/23   [provider]  oxyCODONE  (OXY IR/ROXICODONE ) 5 MG immediate release tablet Take 1 tablet (5 mg total) by mouth every 8 (eight) hours as needed for severe pain (pain score 7-10). 11/05/24   [name], Dinah C, NP  pantoprazole  (PROTONIX ) 40 MG tablet Take 1 tablet (40 mg total) by mouth 2 (two) times daily. 08/04/24   Barbarann Nest, MD  potassium citrate  (UROCIT-K ) 10 MEQ (1080 MG) SR tablet Take 1 tablet (10 mEq total) by mouth 2 (two) times daily after  meals. 11/05/24       Allergies: Other    Review of Systems  Unable to perform ROS: Mental status change    Updated Vital Signs BP (!) 159/95   Pulse 73   Temp 97.7 F (36.5 C) (Oral)   Resp (!) 23   Ht 6' 5 (1.956 m)   Wt  68 kg   SpO2 100%   BMI 17.78 kg/m   Physical Exam Vitals and nursing note reviewed.  Constitutional:      Appearance: He is well-developed. He is ill-appearing.  HENT:     Head: Normocephalic and atraumatic.  Cardiovascular:     Rate and Rhythm: Normal rate and regular rhythm.     Heart sounds: Normal heart sounds.  Pulmonary:     Effort: Tachypnea present.     Breath sounds: Normal breath sounds.     Comments: Kussmaul breathing Abdominal:     Palpations: Abdomen is soft.     Tenderness: There is no abdominal tenderness.  Skin:    General: Skin is warm and dry.  Neurological:     Mental Status: He is alert.     Comments: Patient is relatively lethargic. He opens his eyes to  voice and tracks but is not speaking back to me currently.     (all labs ordered are listed, but only abnormal results are displayed) Labs Reviewed  CBC WITH DIFFERENTIAL/PLATELET - Abnormal; Notable for the following components:      Result Value   WBC 12.0 (*)    MCV 109.4 (*)    MCH 35.7 (*)    RDW 11.1 (*)    Neutro Abs 10.3 (*)    Lymphs Abs 0.6 (*)    Abs Immature Granulocytes 0.21 (*)    All other components within normal limits  BASIC METABOLIC PANEL WITH GFR - Abnormal; Notable for the following components:   Sodium 126 (*)    Potassium 5.2 (*)    Chloride 84 (*)    CO2 <7 (*)    Glucose, Bld 562 (*)    Creatinine, Ser 1.56 (*)    GFR, Estimated 58 (*)    All other components within normal limits  BLOOD GAS, VENOUS - Abnormal; Notable for the following components:   pH, Ven <6.95 (*)    pCO2, Ven 20 (*)    pO2, Ven 47 (*)    Bicarbonate 3.5 (*)    Acid-base deficit 29.3 (*)    All other components within normal limits  HEPATIC FUNCTION PANEL - Abnormal; Notable for the following components:   Total Protein 8.7 (*)    AST 98 (*)    ALT 85 (*)    Alkaline Phosphatase 226 (*)    Bilirubin, Direct 0.3 (*)    All other components within normal limits  CBG MONITORING, ED - Abnormal; Notable for the following components:   Glucose-Capillary 521 (*)    All other components within normal limits  I-STAT CHEM 8, ED - Abnormal; Notable for the following components:   Sodium 123 (*)    Potassium 6.9 (*)    BUN 24 (*)    Glucose, Bld 531 (*)    Calcium , Ion 1.03 (*)    TCO2 5 (*)    Hemoglobin 18.0 (*)    HCT 53.0 (*)    All other components within normal limits  CBG MONITORING, ED - Abnormal; Notable for the following components:   Glucose-Capillary 556 (*)    All other components within normal limits  CBG MONITORING, ED - Abnormal; Notable for the following components:   Glucose-Capillary 515 (*)    All other components within normal limits  CBG MONITORING, ED -  Abnormal; Notable for the following components:   Glucose-Capillary 500 (*)    All other components within normal limits  LIPASE, BLOOD  URINALYSIS, ROUTINE W REFLEX MICROSCOPIC  URINE DRUG SCREEN  BETA-HYDROXYBUTYRIC ACID    EKG: EKG Interpretation Date/Time:  Wednesday November 07 2024 09:22:59 EST Ventricular Rate:  73 PR Interval:  148 QRS Duration:  136 QT Interval:  444 QTC Calculation: 490 R Axis:   86  Text Interpretation: Sinus rhythm diffuse ST elevations, likely early repol poor data quality limits interpretation Confirmed by Freddi Hamilton [phone]) on 11/07/2024 9:32:32 AM  Radiology: [facility] Chest 2 View Result Date: 11/07/2024 CLINICAL DATA:  Shortness of breath EXAM: CHEST - 2 VIEW COMPARISON:  August 01, 2024 FINDINGS: The heart size and mediastinal contours are within normal limits. Both lungs are clear. The visualized skeletal structures are unremarkable. IMPRESSION: No active cardiopulmonary disease. Electronically Signed   By: Lynwood Landy Raddle M.D.   On: 11/07/2024 08:50     .Critical Care  Performed by: Freddi Hamilton, MD Authorized by: Freddi Hamilton, MD   Critical care provider statement:    Critical care time (minutes):  45   Critical care time was exclusive of:  Separately billable procedures and treating other patients   Critical care was necessary to treat or prevent imminent or life-threatening deterioration of the following conditions:  Endocrine crisis, CNS failure or compromise and renal failure   Critical care was time spent personally by me on the following activities:  Development of treatment plan with patient or surrogate, discussions with consultants, evaluation of patient's response to treatment, examination of patient, ordering and review of laboratory studies, ordering and review of radiographic studies, ordering and performing treatments and interventions, pulse oximetry, re-evaluation of patient's condition and review of old charts     Medications Ordered in the ED  insulin  regular, human (MYXREDLIN ) 100 units/ 100 mL infusion (10.5 Units/hr Intravenous Rate/Dose Change 11/07/24 1012)  dextrose  5 % in lactated ringers  infusion (has no administration in time range)  dextrose  50 % solution 0-50 mL (has no administration in time range)  lactated ringers  infusion ( Intravenous New Bag/Given 11/07/24 1015)  pantoprazole  (PROTONIX ) injection 40 mg (has no administration in time range)  lactated ringers  bolus 2,000 mL (0 mLs Intravenous Stopped 11/07/24 0959)  calcium  gluconate 1 g/ 50 mL sodium chloride  IVPB (0 mg Intravenous Stopped 11/07/24 0953)                                    Medical Decision Making Amount and/or Complexity of Data Reviewed Labs: ordered.    Details: Severe metabolic acidosis from DKA.  Acute kidney injury. Radiology: ordered and independent interpretation performed.    Details: No pneumonia ECG/medicine tests: ordered and independent interpretation performed.    Details: Sinus rhythm, early repolarization  Risk Prescription drug management. Decision regarding hospitalization.   Patient presents with altered mental status in setting of DKA.  His mental status has improved with resuscitation including the fluids and insulin .  His i-STAT Chem-8 showed a potassium of 6.8 and his T waves were larger than his most recent ECGs so a dose of calcium  was given.  However it most likely was hemolyzed based on the lab metabolic panel.  Either way he has significant DKA with a pH less than 6.95.  While he is improving, he will likely need continued critical treatment and monitoring.  Discussed with Dr. Kara, who will admit to the ICU.   As he has started to become more alert and is now talking to me, he is endorsing some right sided abdominal pain.  Appears have a history of recurrent and chronic pancreatitis.  LFTs and lipase are near his baseline.  This is probably more from DKA, at this point based on his  repeat exam, while there is tenderness, it is overall soft and benign and I do not think acute imaging is needed at this time.     Final diagnoses:  Diabetic ketoacidosis without coma associated with type 1 diabetes mellitus (HCC)  Acute kidney injury    ED Discharge Orders     None          Freddi Hamilton, MD 11/07/24 1148  "

## 2024-11-08 DIAGNOSIS — F1090 Alcohol use, unspecified, uncomplicated: Secondary | ICD-10-CM

## 2024-11-08 DIAGNOSIS — E871 Hypo-osmolality and hyponatremia: Secondary | ICD-10-CM | POA: Diagnosis not present

## 2024-11-08 DIAGNOSIS — E1040 Type 1 diabetes mellitus with diabetic neuropathy, unspecified: Secondary | ICD-10-CM

## 2024-11-08 DIAGNOSIS — Z8659 Personal history of other mental and behavioral disorders: Secondary | ICD-10-CM

## 2024-11-08 DIAGNOSIS — E876 Hypokalemia: Secondary | ICD-10-CM | POA: Diagnosis not present

## 2024-11-08 DIAGNOSIS — E1010 Type 1 diabetes mellitus with ketoacidosis without coma: Secondary | ICD-10-CM | POA: Diagnosis not present

## 2024-11-08 DIAGNOSIS — I10 Essential (primary) hypertension: Secondary | ICD-10-CM

## 2024-11-08 DIAGNOSIS — K861 Other chronic pancreatitis: Secondary | ICD-10-CM | POA: Diagnosis not present

## 2024-11-08 DIAGNOSIS — R7401 Elevation of levels of liver transaminase levels: Secondary | ICD-10-CM | POA: Diagnosis not present

## 2024-11-08 LAB — GLUCOSE, CAPILLARY
Glucose-Capillary: 133 mg/dL — ABNORMAL HIGH (ref 70–99)
Glucose-Capillary: 151 mg/dL — ABNORMAL HIGH (ref 70–99)
Glucose-Capillary: 160 mg/dL — ABNORMAL HIGH (ref 70–99)
Glucose-Capillary: 168 mg/dL — ABNORMAL HIGH (ref 70–99)
Glucose-Capillary: 170 mg/dL — ABNORMAL HIGH (ref 70–99)
Glucose-Capillary: 171 mg/dL — ABNORMAL HIGH (ref 70–99)
Glucose-Capillary: 177 mg/dL — ABNORMAL HIGH (ref 70–99)
Glucose-Capillary: 177 mg/dL — ABNORMAL HIGH (ref 70–99)
Glucose-Capillary: 178 mg/dL — ABNORMAL HIGH (ref 70–99)
Glucose-Capillary: 178 mg/dL — ABNORMAL HIGH (ref 70–99)
Glucose-Capillary: 191 mg/dL — ABNORMAL HIGH (ref 70–99)
Glucose-Capillary: 199 mg/dL — ABNORMAL HIGH (ref 70–99)
Glucose-Capillary: 253 mg/dL — ABNORMAL HIGH (ref 70–99)
Glucose-Capillary: 83 mg/dL (ref 70–99)

## 2024-11-08 LAB — BASIC METABOLIC PANEL WITH GFR
Anion gap: 15 (ref 5–15)
Anion gap: 13 (ref 5–15)
BUN: 15 mg/dL (ref 6–20)
BUN: 15 mg/dL (ref 6–20)
CO2: 18 mmol/L — ABNORMAL LOW (ref 22–32)
CO2: 19 mmol/L — ABNORMAL LOW (ref 22–32)
Calcium: 9.3 mg/dL (ref 8.9–10.3)
Calcium: 9.5 mg/dL (ref 8.9–10.3)
Chloride: 96 mmol/L — ABNORMAL LOW (ref 98–111)
Chloride: 98 mmol/L (ref 98–111)
Creatinine, Ser: 1.34 mg/dL — ABNORMAL HIGH (ref 0.61–1.24)
Creatinine, Ser: 1.29 mg/dL — ABNORMAL HIGH (ref 0.61–1.24)
GFR, Estimated: >60 mL/min
GFR, Estimated: >60 mL/min
Glucose, Bld: 191 mg/dL — ABNORMAL HIGH (ref 70–99)
Glucose, Bld: 174 mg/dL — ABNORMAL HIGH (ref 70–99)
Potassium: 3.7 mmol/L (ref 3.5–5.1)
Potassium: 3.4 mmol/L — ABNORMAL LOW (ref 3.5–5.1)
Sodium: 129 mmol/L — ABNORMAL LOW (ref 135–145)
Sodium: 130 mmol/L — ABNORMAL LOW (ref 135–145)

## 2024-11-08 LAB — CBC
Hemoglobin: 11.5 g/dL — ABNORMAL LOW (ref 13.0–17.0)
Platelets: 73 K/uL — ABNORMAL LOW (ref 150–400)
WBC: 8.2 K/uL (ref 4.0–10.5)
nRBC: 0 % (ref 0.0–0.2)

## 2024-11-08 LAB — PHOSPHORUS: Phosphorus: <1 mg/dL — CL (ref 2.5–4.6)

## 2024-11-08 LAB — MAGNESIUM: Magnesium: 1.3 mg/dL — ABNORMAL LOW (ref 1.7–2.4)

## 2024-11-08 LAB — BETA-HYDROXYBUTYRIC ACID
Beta-Hydroxybutyric Acid: 5.68 mmol/L — ABNORMAL HIGH (ref 0.05–0.27)
Beta-Hydroxybutyric Acid: 2.52 mmol/L — ABNORMAL HIGH (ref 0.05–0.27)
Beta-Hydroxybutyric Acid: 1.25 mmol/L — ABNORMAL HIGH (ref 0.05–0.27)

## 2024-11-08 MED ORDER — LACTATED RINGERS IV SOLN: INTRAVENOUS | Status: AC

## 2024-11-08 MED ORDER — INSULIN ASPART 100 UNIT/ML IJ SOLN: 0.0000 [IU] | Freq: Every day | INTRAMUSCULAR | Status: DC

## 2024-11-08 MED ORDER — POTASSIUM PHOSPHATES 15 MMOLE/5ML IV SOLN
30.0000 mmol | Freq: Once | INTRAVENOUS | Status: AC
  Administered 2024-11-08: 30 mmol via INTRAVENOUS
  Filled 2024-11-08: qty 10

## 2024-11-08 MED ORDER — MAGNESIUM SULFATE 2 GM/50ML IV SOLN
2.0000 g | Freq: Once | INTRAVENOUS | Status: AC
  Administered 2024-11-08: 2 g via INTRAVENOUS
  Filled 2024-11-08: qty 50

## 2024-11-08 MED ORDER — PANCRELIPASE (LIP-PROT-AMYL) 36000-114000 UNITS PO CPEP: 36000.0000 [IU] | ORAL_CAPSULE | ORAL | Status: DC

## 2024-11-08 MED ORDER — INSULIN ASPART 100 UNIT/ML IJ SOLN
0.0000 [IU] | Freq: Three times a day (TID) | INTRAMUSCULAR | Status: DC
  Administered 2024-11-08: 2 [IU] via SUBCUTANEOUS
  Administered 2024-11-08: 5 [IU] via SUBCUTANEOUS
  Administered 2024-11-09 (×2): 3 [IU] via SUBCUTANEOUS
  Filled 2024-11-08: qty 2
  Filled 2024-11-08: qty 5
  Filled 2024-11-08: qty 3

## 2024-11-08 MED ORDER — POTASSIUM CHLORIDE 20 MEQ PO PACK
40.0000 meq | PACK | Freq: Once | ORAL | Status: AC
  Administered 2024-11-08: 40 meq via ORAL
  Filled 2024-11-08: qty 2

## 2024-11-08 MED ORDER — INSULIN GLARGINE 100 UNIT/ML ~~LOC~~ SOLN
12.0000 [IU] | Freq: Every day | SUBCUTANEOUS | Status: DC
  Administered 2024-11-08 – 2024-11-09 (×2): 12 [IU] via SUBCUTANEOUS
  Filled 2024-11-08 (×2): qty 0.12

## 2024-11-08 MED ORDER — INSULIN GLARGINE-YFGN 100 UNIT/ML ~~LOC~~ SOLN: 12.0000 [IU] | Freq: Every day | SUBCUTANEOUS | Status: DC

## 2024-11-08 MED ORDER — CARVEDILOL 12.5 MG PO TABS: 12.5000 mg | ORAL_TABLET | Freq: Two times a day (BID) | ORAL | Status: DC

## 2024-11-08 MED ORDER — INSULIN ASPART 100 UNIT/ML IJ SOLN
3.0000 [IU] | Freq: Three times a day (TID) | INTRAMUSCULAR | Status: DC
  Administered 2024-11-08 – 2024-11-09 (×3): 3 [IU] via SUBCUTANEOUS
  Filled 2024-11-08 (×2): qty 3

## 2024-11-08 MED ORDER — GABAPENTIN 100 MG PO CAPS
100.0000 mg | ORAL_CAPSULE | Freq: Two times a day (BID) | ORAL | Status: DC
  Administered 2024-11-08 – 2024-11-09 (×3): 100 mg via ORAL
  Filled 2024-11-08 (×3): qty 1

## 2024-11-08 NOTE — Progress Notes (Signed)
 "  NAME:  Carlos [name], MRN:  [id number], DOB:  Jul 23, 1986, LOS: 1 ADMISSION DATE:  11/07/2024, CONSULTATION DATE:  12/31 REFERRING MD:  Freddi, EDP, CHIEF COMPLAINT:  DKA   History of Present Illness:  39 year old male with past medical history of anxiety, type 1 diabetes who presented to [facility] ED 12/31 with DKA.   Wife provided history in ED who reported patient has not taken his insulin  in 1 week. She noticed a few days of fruity breath, polydipsia and increased work of breathing. Today, he was more confused which prompted her to bring him to the ED. Afebrile, lethargic. Labs notable for WBC 12, Na 126, K 5.2, Cl 84, bicarb <7, glucose 562, sCr 1.56, AG non-calculable. Venous gas with pH <6.95, pCO2 20, pO2 47, bicarb 3.5, AST 98, ALT 85, alk phos 226, BHB sent and pending, CXR negative for acute findings. He was given 2L IVF bolus, 1g calcium  gluconate and started on insulin  drip. CCM consulted for admission.   Pertinent  Medical History  Anxiety, T1DM  Significant Hospital Events: Including procedures, antibiotic start and stop dates in addition to other pertinent events   12/31: admit for DKA   Interim History / Subjective:   Patient feeling better Continues to have abdominal pain and feels a bit out of it  Objective   Blood pressure (!) 145/93, pulse (!) 106, temperature 98.8 F (37.1 C), temperature source Oral, resp. rate 14, height 6' 5 (1.956 m), weight 56.8 kg, SpO2 98%.        Intake/Output Summary (Last 24 hours) at 11/08/2024 0743 Last data filed at 11/08/2024 9344 Gross per 24 hour  Intake 3452.26 ml  Output 1500 ml  Net 1952.26 ml   Filed Weights   11/07/24 0740 11/08/24 0500  Weight: 68 kg 56.8 kg    Examination: General: young male, moderate distress, laying in bed HENT: [place]/AT, dry mucous membranes Lungs: clear to auscultation, no wheezing Cardiovascular: rrr Abdomen: soft, non-tender, non-distended Extremities: warm, no edema Neuro: alert, oriented, moving all  extremities GU: n/a  Resolved Hospital Problem list   Leukocytosis   Assessment & Plan:  DKA  Type 1 diabetes  AGMA  Not taking insulin  for 1 week d/t inability to check glucose at home -transition to subcutaneous insulin  today - stop insulin  drip - diet ordered - f/u A1c   Mild hyponatremia   Hypokalemia  Hypophosphatemia - trend, replete as needed - give LR 100ml/hr for 10 hours today  Elevated LFTs  - trend  Chronic pancreatitis 2/2 chronic alcohol use   - resume creon  - oxycodone  prn for chronic pain  Hypertension  - resume Carvedilol  12.5mg  BID - can use prn hydralazine  if needed for sBP or greater  History of anxiety  - no home meds  - monitor   Neuropathy  - resume gabapentin    Labs   CBC: Recent Labs  Lab 11/07/24 0736 11/07/24 0757 11/07/24 1656  WBC 12.0*  --  12.8*  NEUTROABS 10.3*  --   --   HGB 16.3 18.0* 13.4  HCT 50.0 53.0* 37.4*  MCV 109.4*  --  99.7  PLT 157  --  83*    Basic Metabolic Panel: Recent Labs  Lab 11/07/24 1656 11/07/24 1858 11/07/24 2238 11/08/24 0324 11/08/24 0637  NA 131* 130* 129* 129* 130*  K 4.3 4.5 4.0 3.7 3.4*  CL 93* 95* 94* 96* 98  CO2 <7* 9* 14* 18* 19*  GLUCOSE 141* 174* 195* 191* 174*  BUN 16  16 15 15 15   CREATININE 1.32* 1.34* 1.40* 1.34* 1.29*  CALCIUM  9.2 9.0 9.3 9.3 9.5  MG  --   --   --   --  1.3*  PHOS  --   --   --   --  <1.0*   GFR: Estimated Creatinine Clearance: 62.4 mL/min (A) (by C-G formula based on SCr of 1.29 mg/dL (H)). Recent Labs  Lab 11/07/24 0736 11/07/24 1656  WBC 12.0* 12.8*    Liver Function Tests: Recent Labs  Lab 11/07/24 0915  AST 98*  ALT 85*  ALKPHOS 226*  BILITOT 0.6  PROT 8.7*  ALBUMIN  5.0   Recent Labs  Lab 11/07/24 0915  LIPASE 38   No results for input(s): AMMONIA in the last 168 hours.  ABG    Component Value Date/Time   PHART 7.499 (H) 09/30/2020 1936   PCO2ART 32.6 09/30/2020 1936   PO2ART 71 (L) 09/30/2020 1936   HCO3 3.5  (L) 11/07/2024 0915   TCO2 5 (L) 11/07/2024 0757   ACIDBASEDEF 29.3 (H) 11/07/2024 0915   O2SAT 79.2 11/07/2024 0915     Coagulation Profile: No results for input(s): INR, PROTIME in the last 168 hours.  Cardiac Enzymes: No results for input(s): CKTOTAL, CKMB, CKMBINDEX, TROPONINI in the last 168 hours.  HbA1C: Hemoglobin A1C  Date/Time Value Ref Range Status  09/19/2023 02:20 PM 7.9 (A) 4.0 - 5.6 % Final   Hgb A1c MFr Bld  Date/Time Value Ref Range Status  08/02/2024 03:05 AM 9.3 (H) 4.8 - 5.6 % Final    Comment:    (NOTE) Diagnosis of Diabetes The following HbA1c ranges recommended by the American Diabetes Association (ADA) may be used as an aid in the diagnosis of diabetes mellitus.  Hemoglobin             Suggested A1C NGSP%              Diagnosis  <5.7                   Non Diabetic  5.7-6.4                Pre-Diabetic  >6.4                   Diabetic  <7.0                   Glycemic control for                       adults with diabetes.    07/06/2023 09:23 AM 8.3 (H) <5.7 % of total Hgb Final    Comment:    For someone without known diabetes, a hemoglobin A1c value of 6.5% or greater indicates that they may have  diabetes and this should be confirmed with a follow-up  test. . For someone with known diabetes, a value <7% indicates  that their diabetes is well controlled and a value  greater than or equal to 7% indicates suboptimal  control. A1c targets should be individualized based on  duration of diabetes, age, comorbid conditions, and  other considerations. . Currently, no consensus exists regarding use of hemoglobin A1c for diagnosis of diabetes for children. .     CBG: Recent Labs  Lab 11/08/24 0231 11/08/24 0323 11/08/24 0440 11/08/24 0544 11/08/24 0647  GLUCAP 199* 191* 168* 177* 178*    Critical care time: n/a    Dorn Chill, MD [facility]  Office: [phone]   See Amion for personal  pager PCCM on call pager [phone] until 7pm. Please call Elink 7p-7a. [phone]       "

## 2024-11-08 NOTE — Plan of Care (Signed)
  Problem: Education: Goal: Knowledge of General Education information will improve Description: Including pain rating scale, medication(s)/side effects and non-pharmacologic comfort measures Outcome: Progressing   Problem: Clinical Measurements: Goal: Will remain free from infection Outcome: Progressing Goal: Diagnostic test results will improve Outcome: Progressing   Problem: Nutrition: Goal: Adequate nutrition will be maintained Outcome: Progressing   Problem: Elimination: Goal: Will not experience complications related to urinary retention Outcome: Progressing   Problem: Safety: Goal: Ability to remain free from injury will improve Outcome: Progressing

## 2024-11-08 NOTE — Plan of Care (Signed)
" °  Problem: Clinical Measurements: Goal: Ability to maintain clinical measurements within normal limits will improve Outcome: Progressing Goal: Diagnostic test results will improve Outcome: Progressing Goal: Respiratory complications will improve Outcome: Progressing Goal: Cardiovascular complication will be avoided Outcome: Progressing   Problem: Activity: Goal: Risk for activity intolerance will decrease Outcome: Progressing   Problem: Nutrition: Goal: Adequate nutrition will be maintained Outcome: Progressing   Problem: Pain Managment: Goal: General experience of comfort will improve and/or be controlled Outcome: Progressing   "

## 2024-11-09 ENCOUNTER — Other Ambulatory Visit (HOSPITAL_COMMUNITY): Payer: Self-pay

## 2024-11-09 DIAGNOSIS — Z681 Body mass index (BMI) 19 or less, adult: Secondary | ICD-10-CM

## 2024-11-09 LAB — GLUCOSE, CAPILLARY
Glucose-Capillary: 156 mg/dL — ABNORMAL HIGH (ref 70–99)
Glucose-Capillary: 222 mg/dL — ABNORMAL HIGH (ref 70–99)
Glucose-Capillary: 244 mg/dL — ABNORMAL HIGH (ref 70–99)
Glucose-Capillary: 55 mg/dL — ABNORMAL LOW (ref 70–99)
Glucose-Capillary: 67 mg/dL — ABNORMAL LOW (ref 70–99)
Glucose-Capillary: 120 mg/dL — ABNORMAL HIGH (ref 70–99)
Glucose-Capillary: 61 mg/dL — ABNORMAL LOW (ref 70–99)

## 2024-11-09 LAB — BASIC METABOLIC PANEL WITH GFR
Anion gap: 12 (ref 5–15)
BUN: 15 mg/dL (ref 6–20)
CO2: 20 mmol/L — ABNORMAL LOW (ref 22–32)
Calcium: 9.7 mg/dL (ref 8.9–10.3)
Chloride: 99 mmol/L (ref 98–111)
Creatinine, Ser: 1.15 mg/dL (ref 0.61–1.24)
GFR, Estimated: >60 mL/min
Glucose, Bld: 164 mg/dL — ABNORMAL HIGH (ref 70–99)
Potassium: 3.4 mmol/L — ABNORMAL LOW (ref 3.5–5.1)
Sodium: 131 mmol/L — ABNORMAL LOW (ref 135–145)

## 2024-11-09 LAB — PHOSPHORUS: Phosphorus: 1.4 mg/dL — ABNORMAL LOW (ref 2.5–4.6)

## 2024-11-09 LAB — MAGNESIUM: Magnesium: 2 mg/dL (ref 1.7–2.4)

## 2024-11-09 MED ORDER — ENSURE MAX PROTEIN PO LIQD
11.0000 [oz_av] | Freq: Two times a day (BID) | ORAL | 1 refills | Status: AC
  Filled 2024-11-09: qty 59400, 90d supply, fill #0

## 2024-11-09 MED ORDER — BLOOD GLUCOSE MONITOR SYSTEM W/DEVICE KIT
1.0000 | PACK | Freq: Three times a day (TID) | 0 refills | Status: AC
  Filled 2024-11-09: qty 1, 30d supply, fill #0

## 2024-11-09 MED ORDER — LANCET DEVICE MISC
1.0000 | Freq: Three times a day (TID) | 0 refills | Status: AC
  Filled 2024-11-09: qty 1, 30d supply, fill #0

## 2024-11-09 MED ORDER — BLOOD GLUCOSE TEST VI STRP
1.0000 | ORAL_STRIP | Freq: Three times a day (TID) | 0 refills | Status: AC
  Filled 2024-11-09: qty 100, 34d supply, fill #0

## 2024-11-09 MED ORDER — ENSURE MAX PROTEIN PO LIQD
11.0000 [oz_av] | Freq: Two times a day (BID) | ORAL | Status: DC
  Administered 2024-11-09: 11 [oz_av] via ORAL
  Filled 2024-11-09 (×2): qty 330

## 2024-11-09 MED ORDER — INSULIN ASPART 100 UNIT/ML IJ SOLN
4.0000 [IU] | Freq: Three times a day (TID) | INTRAMUSCULAR | Status: DC
  Administered 2024-11-09: 4 [IU] via SUBCUTANEOUS

## 2024-11-09 MED ORDER — LANCETS MISC
1.0000 | 0 refills | Status: AC
  Filled 2024-11-09: qty 100, 25d supply, fill #0

## 2024-11-09 MED ORDER — POTASSIUM CHLORIDE CRYS ER 20 MEQ PO TBCR
40.0000 meq | EXTENDED_RELEASE_TABLET | ORAL | Status: DC
  Administered 2024-11-09: 40 meq via ORAL
  Filled 2024-11-09: qty 2

## 2024-11-09 NOTE — Progress Notes (Signed)
 Discharge supplies in a secure bag delivered to patient by this RN.

## 2024-11-09 NOTE — Progress Notes (Signed)
 Initial Nutrition Assessment  DOCUMENTATION CODES:   Severe malnutrition in context of chronic illness  INTERVENTION:  - Carb Modified diet.  - Ensure Max po BID, each supplement provides 150 kcal and 30 grams of protein.  - High-Calorie, High-Protein Nutrition Therapy diet education handout provided and reviewed.  - Monitor weight trends.   NUTRITION DIAGNOSIS:   Severe Malnutrition related to chronic illness as evidenced by severe fat depletion, severe muscle depletion, percent weight loss (28% in 11 months).  GOAL:   Patient will meet greater than or equal to 90% of their needs  MONITOR:   PO intake, Supplement acceptance, Labs, Weight trends  REASON FOR ASSESSMENT:   Malnutrition Screening Tool, Other (Comment) (low BMI)    ASSESSMENT:   39 year old male withPMH of anxiety and type 1 diabetes who presented with DKA.  Patient reports a UBW of 160# and weight loss over the past ~1.5 months. Per EMR, patient has had a 50# or 28% weight loss over the past 11 months.  He endorses typically eating 1-2 meals a day at home. Over the past 1 month, he reports he was eating more than this due to feeling so hungry. However, the 1 week PTA he reports eating poorly.   Current appetite is improved and patient reports eating fairly well at breakfast. He is documented to be eating 25-50% of meals.  Patient reports having had diabetes education in the past and denies needing any further education at this time. Per H&P, patient reportedly was not taking insulin  1 week PTA due to inability to check glucose at home. However, patient did request education on adding calories and protein to diet. RD provided High-Calorie, High-Protein Nutrition Therapy diet education handout and reviewed. Discussed tips for bulking up meals to assist In healthy weight gain. Discussed that some suggestions may not be appropriate, such as drinking sugar containing beverages, and patient endorsed understanding.    Patient hoping to discharge today. He has drinking [employer] at home. Agreeable to try Ensure Max while admitted.   Medications reviewed and include: -  Labs reviewed:  Na 131 K+ 3.4 HA1C 9.3 (as of 9/25) Blood Glucose 83-253 x24 hours  NUTRITION - FOCUSED PHYSICAL EXAM:  Flowsheet Row Most Recent Value  Orbital Region Severe depletion  Upper Arm Region Severe depletion  Thoracic and Lumbar Region Severe depletion  Buccal Region Moderate depletion  Temple Region Moderate depletion  Clavicle Bone Region Severe depletion  Clavicle and Acromion Bone Region Severe depletion  Scapular Bone Region Unable to assess  Dorsal Hand Mild depletion  Patellar Region Severe depletion  Anterior Thigh Region Severe depletion  Posterior Calf Region Severe depletion  Edema (RD Assessment) None  Hair Reviewed  Eyes Reviewed  Mouth Reviewed  Skin Reviewed  Nails Reviewed    Diet Order:   Diet Order             Diet Carb Modified Room service appropriate? Yes  Diet effective 1000                   EDUCATION NEEDS:  Education needs have been addressed  Skin:  Skin Assessment: Reviewed RN Assessment  Last BM:  PTA  Height:  Ht Readings from Last 1 Encounters:  11/07/24 6' 5 (1.956 m)   Weight:  Wt Readings from Last 1 Encounters:  11/09/24 58.5 kg   Ideal Body Weight:  94.5 kg  BMI:  Body mass index is 15.29 kg/m.  Estimated Nutritional Needs:  Kcal:  7749-7549  kcals Protein:  100-125 grams Fluid:  >/= 2.2L    Trude Ned RD, LDN Contact via Secure Chat.

## 2024-11-09 NOTE — Progress Notes (Signed)
" °  Hypoglycemic Event  CBG: 55  Treatment: 4 oz juice/soda  Symptoms: None  Follow-up CBG: Time:1306, 1341, 1428 CBG Result: 67, 61, 120  Possible Reasons for Event: Inadequate meal intake  Comments/MD notified:MD notified. Possible cause was inadequate carb intake for breakfast/brunch    Fatemah Pourciau, Alm Motto   "

## 2024-11-09 NOTE — Progress Notes (Signed)
" °   11/09/24 1041  TOC Brief Assessment  Insurance and Status Reviewed  Patient has primary care physician Yes  Home environment has been reviewed Single family home  Prior level of function: Independent with ADL's  Prior/Current Home Services No current home services  Social Drivers of Health Review SDOH reviewed no interventions necessary  Readmission risk has been reviewed Yes  Transition of care needs no transition of care needs at this time    "

## 2024-11-09 NOTE — Plan of Care (Signed)
" °  Problem: Clinical Measurements: Goal: Ability to maintain clinical measurements within normal limits will improve Outcome: Progressing Goal: Diagnostic test results will improve Outcome: Progressing Goal: Respiratory complications will improve Outcome: Progressing   Problem: Activity: Goal: Risk for activity intolerance will decrease Outcome: Progressing   Problem: Nutrition: Goal: Adequate nutrition will be maintained Outcome: Progressing   Problem: Coping: Goal: Level of anxiety will decrease Outcome: Progressing   Problem: Pain Managment: Goal: General experience of comfort will improve and/or be controlled Outcome: Progressing   "

## 2024-11-09 NOTE — Discharge Summary (Addendum)
 Physician Discharge Summary  Carlos [name] FMW:[id number] DOB: Aug 20, 1986 DOA: 11/07/2024  PCP: [name], NP  Admit date: 11/07/2024 Discharge date: 11/09/2024 Discharging to: home Recommendations for Outpatient Follow-up:  New prescription given for glucometer   Discharge Diagnoses:   Principal Problem:   DKA (diabetic ketoacidosis) (HCC) Active Problems:   Underweight (BMI < 18.5)     Hospital Course:  39 year old male with past medical history of anxiety, type 1 diabetes who presented to [facility] ED 12/31 with DKA.    Wife provided history in ED who reported patient has not taken his insulin  in 1 week. She noticed a few days of fruity breath, polydipsia and increased work of breathing. Today, he was more confused which prompted her to bring him to the ED. Afebrile, lethargic. Labs notable for WBC 12, Na 126, K 5.2, Cl 84, bicarb <7, glucose 562, sCr 1.56, AG non-calculable. Venous gas with pH <6.95, pCO2 20, pO2 47, bicarb 3.5, AST 98, ALT 85, alk phos 226, BHB sent and pending, CXR negative for acute findings. He was given 2L IVF bolus, 1g calcium  gluconate and started on insulin  drip. CCM consulted for admission.   Principal Problem:   DKA (diabetic ketoacidosis) (HCC) Active Problems:   Underweight (BMI < 18.5)  - due to lack of CGM equipment, he decided to not take his insulin  - have provided a script for glucomer and testing supplies - he can return to his usual Lantus  and Novolog  regiment Hemoglobin A1C    Component Value Date/Time   HGBA1C 9.3 (H) 08/02/2024 0305   Body mass index is 15.29 kg/m. Severely underweight Cont Ensure shakes    Discharge Instructions   Allergies as of 11/09/2024       Reactions   Other Anaphylaxis, Other (See Comments)   [name] - anaphylaxis        Medication List     TAKE these medications    acetaminophen  325 MG tablet Commonly known as: TYLENOL  Take 2 tablets (650 mg total) by mouth every 6 (six) hours as needed for  mild pain (pain score 1-3) or fever (or Fever >/= 101). What changed: reasons to take this   Blood Glucose Monitor System w/Device Kit Use to check blood sugar in the morning, at noon, and at bedtime.   BLOOD GLUCOSE TEST STRIPS Strp Use to check blood sugar 3 times daily   Dexcom G7 Sensor Misc Use as directed and change every 10 days.   Ensure Max Protein Liqd Take 330 mLs (11 oz total) by mouth 2 (two) times daily.   gabapentin  100 MG capsule Commonly known as: Neurontin  Take 1 capsule (100 mg total) by mouth 2 (two) times daily.   Insulin  Pen Needle 32G X 4 MM Misc 1 Device by Does not apply route in the morning, at noon, in the evening, and at bedtime.   TechLite Pen Needles 32G X 4 MM Misc Generic drug: Insulin  Pen Needle Inject 1 each into the skin daily.   Lancets Misc Use to check blood sugar up to 4 times daily   Lantus  SoloStar 100 UNIT/ML Solostar Pen Generic drug: insulin  glargine Inject 12 Units into the skin daily. What changed:  how much to take when to take this   naloxone  4 MG/0.1ML Liqd nasal spray kit Commonly known as: NARCAN  Place 1 spray into the nose once as needed (Overdose).   NovoLOG  FlexPen 100 UNIT/ML FlexPen Generic drug: insulin  aspart Max daily 30 units What changed:  how much to take how  to take this when to take this additional instructions   OneTouch Delica Plus Lancing Misc 1 each by Does not apply route in the morning, at noon, and at bedtime. May substitute to any manufacturer covered by patient's insurance.   oxyCODONE  5 MG immediate release tablet Commonly known as: Oxy IR/ROXICODONE  Take 1 tablet (5 mg total) by mouth every 8 (eight) hours as needed for severe pain (pain score 7-10). What changed: when to take this   pantoprazole  40 MG tablet Commonly known as: PROTONIX  Take 1 tablet (40 mg total) by mouth 2 (two) times daily. What changed: when to take this   potassium citrate  10 MEQ (1080 MG) SR tablet Commonly  known as: UROCIT-K  Take 1 tablet (10 mEq total) by mouth 2 (two) times daily after  meals.   Therems Tabs Take 1 tablet by mouth daily with breakfast.            The results of significant diagnostics from this hospitalization (including imaging, microbiology, ancillary and laboratory) are listed below for reference.    DG Chest 2 View Result Date: 11/07/2024 CLINICAL DATA:  Shortness of breath EXAM: CHEST - 2 VIEW COMPARISON:  August 01, 2024 FINDINGS: The heart size and mediastinal contours are within normal limits. Both lungs are clear. The visualized skeletal structures are unremarkable. IMPRESSION: No active cardiopulmonary disease. Electronically Signed   By: Lynwood Landy Raddle M.D.   On: 11/07/2024 08:50   Labs:   Basic Metabolic Panel: Recent Labs  Lab 11/07/24 1858 11/07/24 2238 11/08/24 0324 11/08/24 0637 11/09/24 0232  NA 130* 129* 129* 130* 131*  K 4.5 4.0 3.7 3.4* 3.4*  CL 95* 94* 96* 98 99  CO2 9* 14* 18* 19* 20*  GLUCOSE 174* 195* 191* 174* 164*  BUN 16 15 15 15 15   CREATININE 1.34* 1.40* 1.34* 1.29* 1.15  CALCIUM  9.0 9.3 9.3 9.5 9.7  MG  --   --   --  1.3* 2.0  PHOS  --   --   --  <1.0* 1.4*     CBC: Recent Labs  Lab 11/07/24 0736 11/07/24 0757 11/07/24 1656 11/08/24 0823  WBC 12.0*  --  12.8* 8.2  NEUTROABS 10.3*  --   --   --   HGB 16.3 18.0* 13.4 11.5*  HCT 50.0 53.0* 37.4* RESULTS UNAVAILABLE DUE TO INTERFERING SUBSTANCE  MCV 109.4*  --  99.7 RESULTS UNAVAILABLE DUE TO INTERFERING SUBSTANCE  PLT 157  --  83* 73*         SIGNED:   True Atlas, MD  [facility] 11/09/2024, 3:13 PM Time taking on discharge: 50 minutes

## 2024-11-10 ENCOUNTER — Other Ambulatory Visit (HOSPITAL_COMMUNITY): Payer: Self-pay

## 2024-11-12 ENCOUNTER — Telehealth: Payer: Self-pay | Admitting: *Deleted

## 2024-11-12 ENCOUNTER — Telehealth: Payer: Self-pay

## 2024-11-12 NOTE — Transitions of Care (Post Inpatient/ED Visit) (Signed)
" ° °  11/12/2024  Name: Iran Rowe MRN: [id number] DOB: 12/10/1985  Today's TOC FU Call Status: Today's TOC FU Call Status:: Unsuccessful Call (2nd Attempt) Unsuccessful Call (2nd Attempt) Date: 11/12/24  Attempted to reach the patient regarding the most recent Inpatient/ED visit.  Follow Up Plan: Additional outreach attempts will be made to reach the patient to complete the Transitions of Care (Post Inpatient/ED visit) call.   Shona Prow RN, CCM [place]  VBCI-Population Health RN Care Manager [phone]  "

## 2024-11-12 NOTE — Transitions of Care (Post Inpatient/ED Visit) (Signed)
" ° °  11/12/2024  Name: Carlos [name] MRN: [id number] DOB: 05/13/1986  Today's TOC FU Call Status: Today's TOC FU Call Status:: Unsuccessful Call (1st Attempt) Unsuccessful Call (1st Attempt) Date: 11/12/24  Attempted to reach the patient regarding the most recent Inpatient/ED visit. Patient requested call back after 2 PM  Follow Up Plan: Additional outreach attempts will be made to reach the patient to complete the Transitions of Care (Post Inpatient/ED visit) call.   Cathlean Headland BSN RN [facility] Health Care Management Coordinator [email or web address] Direct Dial: [phone]  Fax: [phone] Website: [email or web address]  "

## 2024-11-13 ENCOUNTER — Telehealth: Payer: Self-pay

## 2024-11-13 NOTE — Transitions of Care (Post Inpatient/ED Visit) (Signed)
" ° °  11/13/2024  Name: Deiontae Rabel MRN: [id number] DOB: 1986/03/27  Today's TOC FU Call Status: Today's TOC FU Call Status:: Successful TOC FU Call Completed (Spoke briefly with patient who declined - states he is not at home, is seeing PCP in 2 days - will discuss with PCP if she feels he needs Telephone follow up) TOC FU Call Complete Date: 11/13/24  [facility] RN reviewed with patient this if he changes his mind and PCP is agreeable, she can refer our CCM team at any time.  Patient's Name and Date of Birth confirmed. DOB, Name  Shona Prow RN, CCM [place]  VBCI-Population Health RN Care Manager [phone]  "

## 2024-11-14 ENCOUNTER — Other Ambulatory Visit (HOSPITAL_BASED_OUTPATIENT_CLINIC_OR_DEPARTMENT_OTHER): Payer: Self-pay

## 2024-11-15 ENCOUNTER — Ambulatory Visit: Admitting: Family

## 2024-11-15 ENCOUNTER — Encounter: Payer: Self-pay | Admitting: Family

## 2024-11-15 VITALS — BP 114/68 | HR 82 | Temp 97.9°F | Resp 18 | Ht 77.0 in | Wt 152.8 lb

## 2024-11-15 DIAGNOSIS — M7989 Other specified soft tissue disorders: Secondary | ICD-10-CM

## 2024-11-15 DIAGNOSIS — K860 Alcohol-induced chronic pancreatitis: Secondary | ICD-10-CM

## 2024-11-15 DIAGNOSIS — K2100 Gastro-esophageal reflux disease with esophagitis, without bleeding: Secondary | ICD-10-CM

## 2024-11-15 DIAGNOSIS — F1020 Alcohol dependence, uncomplicated: Secondary | ICD-10-CM

## 2024-11-15 DIAGNOSIS — M79606 Pain in leg, unspecified: Secondary | ICD-10-CM | POA: Diagnosis not present

## 2024-11-15 DIAGNOSIS — I10 Essential (primary) hypertension: Secondary | ICD-10-CM

## 2024-11-15 DIAGNOSIS — E1065 Type 1 diabetes mellitus with hyperglycemia: Secondary | ICD-10-CM | POA: Diagnosis not present

## 2024-11-16 ENCOUNTER — Ambulatory Visit: Payer: Self-pay | Admitting: Family

## 2024-11-16 LAB — CBC WITH DIFFERENTIAL/PLATELET
Absolute Lymphocytes: 574 {cells}/uL — ABNORMAL LOW (ref 850–3900)
Absolute Monocytes: 760 {cells}/uL (ref 200–950)
Basophils Absolute: 31 {cells}/uL (ref 0–200)
Basophils Relative: 1 %
Eosinophils Absolute: 50 {cells}/uL (ref 15–500)
Eosinophils Relative: 1.6 %
HCT: 27.3 % — ABNORMAL LOW (ref 39.4–51.1)
Hemoglobin: 8.8 g/dL — ABNORMAL LOW (ref 13.2–17.1)
MCH: 34.6 pg — ABNORMAL HIGH (ref 27.0–33.0)
MCHC: 32.2 g/dL (ref 31.6–35.4)
MCV: 107.5 fL — ABNORMAL HIGH (ref 81.4–101.7)
MPV: 10.2 fL (ref 7.5–12.5)
Monocytes Relative: 24.5 %
Neutro Abs: 1686 {cells}/uL (ref 1500–7800)
Neutrophils Relative %: 54.4 %
Platelets: 223 Thousand/uL (ref 140–400)
RBC: 2.54 Million/uL — ABNORMAL LOW (ref 4.20–5.80)
RDW: 11.9 % (ref 11.0–15.0)
Total Lymphocyte: 18.5 %
WBC: 3.1 Thousand/uL — ABNORMAL LOW (ref 3.8–10.8)

## 2024-11-16 LAB — HEMOGLOBIN A1C
Hgb A1c MFr Bld: 8.9 % — ABNORMAL HIGH
Mean Plasma Glucose: 209 mg/dL
eAG (mmol/L): 11.6 mmol/L

## 2024-11-16 LAB — TSH: TSH: 0.51 m[IU]/L (ref 0.40–4.50)

## 2024-11-16 LAB — C-REACTIVE PROTEIN: CRP: 5.1 mg/L

## 2024-11-16 LAB — SEDIMENTATION RATE: Sed Rate: 6 mm/h (ref 0–15)

## 2024-11-25 NOTE — Progress Notes (Signed)
 "  Provider: Beanca Kiester FNP-C   Mayci Haning, Roxan BROCKS, NP  Patient Care Team: Peola Joynt, Roxan BROCKS, NP as PCP - General (Family Medicine) Arlana Arnt, MD as Consulting Physician (Otolaryngology)  Extended Emergency Contact Information Primary Emergency Contact: [email or web address] Address: [address]          [place], [place] [address] [place] Home Phone: [phone] Work Phone: [phone] Mobile Phone: [phone] Relation: Significant other  Code Status: Full code Goals of care: Advanced Directive information    11/07/2024    3:00 PM  Advanced Directives  Does Patient Have a Medical Advance Directive? No  Would patient like information on creating a medical advance directive? No - Patient declined     Chief Complaint  Patient presents with   Medication Management    Refill medication    History of Present Illness   Carlos [name] is a 39 year old male with diabetes who presents for a regular checkup and medication refills.  He needs a refill for Creon , which he previously took with meals at a dose of 36,000 units for pancreatic enzymes. He is unsure why Creon  was stopped and mentioned that it may have been after surgery. He has refilled his Dexamethasone  and continues to take Tylenol  as needed for mild pain.  He experiences fluctuating blood sugar levels, generally in the mid to high 100s. He was hospitalized for diabetic ketoacidosis (DKA) in December,2025. He reports working irregular hours around that time. He uses a continuous glucose monitor and takes Novolog  5-10 units on a sliding scale and Lantus  16 units daily. He also takes a multivitamin once daily.  He experiences significant swelling and pain in his feet, hands, and face, which began three days ago. The pain is described as stabbing and burning, particularly in the feet, making it difficult to walk. He has been using compression socks and reports that Tylenol  is not effective for the pain, but  gabapentin  helps with his hands. He drinks Ensure twice daily with meals and has not changed his diet recently.  He takes gabapentin  100 mg twice daily, oxycodone  5 mg every eight hours as needed, and potassium citrate  10 mEq twice daily after meals. He switched from Protonix  to Prilosec for heartburn, taking the lowest dose available over the counter. He also uses Norco nasal spray as needed.  No recent changes in diet or medication, except for switching to Ensure Max. No recent illness or flu-like symptoms, although he has a runny nose due to working in a cold environment. He has not seen an eye doctor recently but has a referral sent in.  He notes that his hands and feet are swollen, with difficulty removing his wedding ring and watch due to swelling. No joint-specific swelling and no changes in diet or salt intake. He has not seen a podiatrist recently.         Past Medical History:  Diagnosis Date   AKI (acute kidney injury) 12/21/2021   Anxiety    Diabetes (HCC)    Gall stones 2025   Pancreatitis    Splenic vein thrombosis    Past Surgical History:  Procedure Laterality Date   BILIARY BRUSHING  02/02/2023   Procedure: BILIARY BRUSHING;  Surgeon: Wilhelmenia Aloha Raddle., MD;  Location: [facility];  Service: Gastroenterology;;   BILIARY BRUSHING  04/28/2023   Procedure: BILIARY BRUSHING;  Surgeon: Aneita Gwendlyn DASEN, MD;  Location: [facility];  Service: Gastroenterology;;   BILIARY BRUSHING  06/02/2023   Procedure: BILIARY BRUSHING;  Surgeon: Wilhelmenia Aloha Raddle., MD;  Location: [facility];  Service: Gastroenterology;;   BILIARY BRUSHING  01/05/2024   Procedure: BILIARY BRUSHING;  Surgeon: Wilhelmenia Aloha Raddle., MD;  Location: [facility];  Service: Gastroenterology;;   BILIARY DILATION  12/01/2022   Procedure: BILIARY DILATION;  Surgeon: Wilhelmenia Aloha Raddle., MD;  Location: [facility];  Service: Gastroenterology;;   BILIARY DILATION  02/02/2023   Procedure:  BILIARY DILATION;  Surgeon: Wilhelmenia Aloha Raddle., MD;  Location: [facility];  Service: Gastroenterology;;   BILIARY DILATION  01/05/2024   Procedure: BILIARY DILATION;  Surgeon: Wilhelmenia Aloha Raddle., MD;  Location: [facility];  Service: Gastroenterology;;   BILIARY STENT PLACEMENT  04/28/2023   Procedure: BILIARY STENT PLACEMENT;  Surgeon: Aneita Gwendlyn DASEN, MD;  Location: [facility];  Service: Gastroenterology;;   BILIARY STENT PLACEMENT N/A 06/02/2023   Procedure: BILIARY STENT PLACEMENT;  Surgeon: Wilhelmenia Aloha Raddle., MD;  Location: [facility];  Service: Gastroenterology;  Laterality: N/A;   BIOPSY  12/02/2021   Procedure: BIOPSY;  Surgeon: Wilhelmenia Aloha Raddle., MD;  Location: [facility];  Service: Gastroenterology;;   ENDOSCOPIC RETROGRADE CHOLANGIOPANCREATOGRAPHY (ERCP) WITH PROPOFOL  N/A 08/12/2022   Procedure: ENDOSCOPIC RETROGRADE CHOLANGIOPANCREATOGRAPHY (ERCP) WITH PROPOFOL ;  Surgeon: Wilhelmenia Aloha Raddle., MD;  Location: [facility];  Service: Gastroenterology;  Laterality: N/A;   ENDOSCOPIC RETROGRADE CHOLANGIOPANCREATOGRAPHY (ERCP) WITH PROPOFOL  N/A 12/01/2022   Procedure: ENDOSCOPIC RETROGRADE CHOLANGIOPANCREATOGRAPHY (ERCP) WITH PROPOFOL ;  Surgeon: Wilhelmenia Aloha Raddle., MD;  Location: [facility];  Service: Gastroenterology;  Laterality: N/A;   ENDOSCOPIC RETROGRADE CHOLANGIOPANCREATOGRAPHY (ERCP) WITH PROPOFOL  N/A 02/02/2023   Procedure: ENDOSCOPIC RETROGRADE CHOLANGIOPANCREATOGRAPHY (ERCP) WITH PROPOFOL ;  Surgeon: Wilhelmenia Aloha Raddle., MD;  Location: [facility];  Service: Gastroenterology;  Laterality: N/A;   ENDOSCOPIC RETROGRADE CHOLANGIOPANCREATOGRAPHY (ERCP) WITH PROPOFOL  N/A 06/02/2023   Procedure: ENDOSCOPIC RETROGRADE CHOLANGIOPANCREATOGRAPHY (ERCP) WITH PROPOFOL ;  Surgeon: Wilhelmenia Aloha Raddle., MD;  Location: [facility];  Service: Gastroenterology;  Laterality: N/A;   ENDOSCOPIC RETROGRADE CHOLANGIOPANCREATOGRAPHY (ERCP) WITH PROPOFOL   N/A 01/05/2024   Procedure: ENDOSCOPIC RETROGRADE CHOLANGIOPANCREATOGRAPHY (ERCP) WITH PROPOFOL ;  Surgeon: Wilhelmenia Aloha Raddle., MD;  Location: [facility];  Service: Gastroenterology;  Laterality: N/A;   ERCP N/A 04/28/2023   Procedure: ENDOSCOPIC RETROGRADE CHOLANGIOPANCREATOGRAPHY (ERCP);  Surgeon: Aneita Gwendlyn DASEN, MD;  Location: [facility];  Service: Gastroenterology;  Laterality: N/A;   ESOPHAGOGASTRODUODENOSCOPY N/A 12/01/2022   Procedure: ESOPHAGOGASTRODUODENOSCOPY (EGD);  Surgeon: Wilhelmenia Aloha Raddle., MD;  Location: [facility];  Service: Gastroenterology;  Laterality: N/A;   ESOPHAGOGASTRODUODENOSCOPY N/A 08/04/2024   Procedure: EGD (ESOPHAGOGASTRODUODENOSCOPY);  Surgeon: Legrand Victory LITTIE DOUGLAS, MD;  Location: [facility];  Service: Gastroenterology;  Laterality: N/A;   ESOPHAGOGASTRODUODENOSCOPY (EGD) WITH PROPOFOL  N/A 12/02/2021   Procedure: ESOPHAGOGASTRODUODENOSCOPY (EGD) WITH PROPOFOL ;  Surgeon: Wilhelmenia Aloha Raddle., MD;  Location: [facility];  Service: Gastroenterology;  Laterality: N/A;   ESOPHAGOGASTRODUODENOSCOPY (EGD) WITH PROPOFOL  N/A 07/09/2023   Procedure: ESOPHAGOGASTRODUODENOSCOPY (EGD) WITH PROPOFOL ;  Surgeon: Abran Norleen SAILOR, MD;  Location: [facility];  Service: Gastroenterology;  Laterality: N/A;  needs ercp scope for stent pull   FINE NEEDLE ASPIRATION N/A 12/01/2022   Procedure: FINE NEEDLE ASPIRATION (FNA) LINEAR;  Surgeon: Wilhelmenia Aloha Raddle., MD;  Location: [facility];  Service: Gastroenterology;  Laterality: N/A;   NEUROLYTIC CELIAC PLEXUS  12/01/2022   Procedure: CELIAC PLEXUS BLOCK;  Surgeon: Wilhelmenia Aloha Raddle., MD;  Location: [facility];  Service: Gastroenterology;;   PANCREATIC STENT PLACEMENT  08/12/2022   Procedure: PANCREATIC STENT PLACEMENT;  Surgeon: Wilhelmenia Aloha Raddle., MD;  Location: [facility];  Service: Gastroenterology;;   PANCREATIC STENT PLACEMENT  12/01/2022   Procedure: PANCREATIC STENT PLACEMENT;  Surgeon:  Wilhelmenia Aloha Raddle., MD;  Location: [facility];  Service: Gastroenterology;;   PANCREATIC STENT PLACEMENT  02/02/2023   Procedure: PANCREATIC STENT PLACEMENT;  Surgeon: Wilhelmenia Aloha Raddle., MD;  Location: [facility];  Service: Gastroenterology;;   PANCREATIC STENT PLACEMENT  06/02/2023   Procedure: PANCREATIC STENT PLACEMENT;  Surgeon: Wilhelmenia Aloha Raddle., MD;  Location: [facility];  Service: Gastroenterology;;   REMOVAL OF STONES  12/01/2022   Procedure: REMOVAL OF STONES;  Surgeon: Wilhelmenia Aloha Raddle., MD;  Location: [facility];  Service: Gastroenterology;;   REMOVAL OF STONES  02/02/2023   Procedure: REMOVAL OF STONES;  Surgeon: Wilhelmenia Aloha Raddle., MD;  Location: [facility];  Service: Gastroenterology;;   REMOVAL OF STONES  06/02/2023   Procedure: REMOVAL OF STONES;  Surgeon: Wilhelmenia Aloha Raddle., MD;  Location: [facility];  Service: Gastroenterology;;   REMOVAL OF STONES  01/05/2024   Procedure: REMOVAL OF SLUDGE;  Surgeon: Wilhelmenia Aloha Raddle., MD;  Location: [facility];  Service: Gastroenterology;;   ANNETT  08/12/2022   Procedure: ANNETT;  Surgeon: Wilhelmenia Aloha Raddle., MD;  Location: [facility];  Service: Gastroenterology;;   ANNETT  12/01/2022   Procedure: ANNETT;  Surgeon: [name], Aloha Raddle., MD;  Location: [facility];  Service: Gastroenterology;;   ANNETT  06/02/2023   Procedure: ANNETT;  Surgeon: Wilhelmenia Aloha Raddle., MD;  Location: [facility];  Service: Gastroenterology;;   CLEDA REMOVAL  12/01/2022   Procedure: STENT REMOVAL;  Surgeon: Wilhelmenia Aloha Raddle., MD;  Location: [facility];  Service: Gastroenterology;;   CLEDA REMOVAL  02/02/2023   Procedure: STENT REMOVAL;  Surgeon: Wilhelmenia Aloha Raddle., MD;  Location: [facility];  Service: Gastroenterology;;   CLEDA REMOVAL  06/02/2023   Procedure: STENT REMOVAL;  Surgeon: Wilhelmenia Aloha Raddle., MD;  Location: [facility];  Service: Gastroenterology;;   CLEDA REMOVAL  07/09/2023   Procedure: STENT REMOVAL;  Surgeon: Abran Norleen SAILOR, MD;  Location: [facility];  Service: Gastroenterology;;   CLEDA REMOVAL  01/05/2024   Procedure: STENT REMOVAL;  Surgeon: Wilhelmenia Aloha Raddle., MD;  Location: [facility];  Service: Gastroenterology;;   UPPER ESOPHAGEAL ENDOSCOPIC ULTRASOUND (EUS) N/A 12/02/2021   Procedure: UPPER ESOPHAGEAL ENDOSCOPIC ULTRASOUND (EUS);  Surgeon: Wilhelmenia Aloha Raddle., MD;  Location: [facility];  Service: Gastroenterology;  Laterality: N/A;   UPPER ESOPHAGEAL ENDOSCOPIC ULTRASOUND (EUS) N/A 12/01/2022   Procedure: UPPER ESOPHAGEAL ENDOSCOPIC ULTRASOUND (EUS);  Surgeon: Wilhelmenia Aloha Raddle., MD;  Location: [facility];  Service: Gastroenterology;  Laterality: N/A;    Allergies[1]  Allergies as of 11/15/2024       Reactions   Other Anaphylaxis, Other (See Comments)   [name] - anaphylaxis        Medication List        Accurate as of November 15, 2024 11:59 PM. If you have any questions, ask your nurse or doctor.          STOP taking these medications    pantoprazole  40 MG tablet Commonly known as: PROTONIX  Stopped by: Roxan Plough, NP       TAKE these medications    Accu-Chek Guide Me w/Device Kit Use to check blood sugar in the morning, at noon, and at bedtime.   Accu-Chek Guide Test test strip Generic drug: glucose blood Use to check blood sugar 3 times daily   Accu-Chek Softclix Lancets lancets Use to check blood sugar up to 4 times daily   acetaminophen  325 MG tablet Commonly known as: TYLENOL  Take 2 tablets (650  mg total) by mouth every 6 (six) hours as needed for mild pain (pain score 1-3) or fever (or Fever >/= 101). What changed: reasons to take this   Dexcom G7 Sensor Misc Use as directed and change every 10 days.   Ensure Max Protein Liqd Take 330 mLs (11 oz total) by mouth 2 (two) times daily.   gabapentin  100 MG  capsule Commonly known as: Neurontin  Take 1 capsule (100 mg total) by mouth 2 (two) times daily.   Insulin  Pen Needle 32G X 4 MM Misc 1 Device by Does not apply route in the morning, at noon, in the evening, and at bedtime.   TechLite Pen Needles 32G X 4 MM Misc Generic drug: Insulin  Pen Needle Inject 1 each into the skin daily.   Lantus  SoloStar 100 UNIT/ML Solostar Pen Generic drug: insulin  glargine Inject 12 Units into the skin daily. What changed:  how much to take when to take this   naloxone  4 MG/0.1ML Liqd nasal spray kit Commonly known as: NARCAN  Place 1 spray into the nose once as needed (Overdose).   NovoLOG  FlexPen 100 UNIT/ML FlexPen Generic drug: insulin  aspart Max daily 30 units What changed:  how much to take how to take this when to take this additional instructions   omeprazole  20 MG capsule Commonly known as: PRILOSEC Take 20 mg by mouth daily.   OneTouch Delica Plus Lancing Misc 1 each by Does not apply route in the morning, at noon, and at bedtime. May substitute to any manufacturer covered by patient's insurance.   oxyCODONE  5 MG immediate release tablet Commonly known as: Oxy IR/ROXICODONE  Take 1 tablet (5 mg total) by mouth every 8 (eight) hours as needed for severe pain (pain score 7-10). What changed: when to take this   potassium citrate  10 MEQ (1080 MG) SR tablet Commonly known as: UROCIT-K  Take 1 tablet (10 mEq total) by mouth 2 (two) times daily after  meals.   Therems Tabs Take 1 tablet by mouth daily with breakfast.        Review of Systems  Constitutional:  Negative for appetite change, chills, fatigue, fever and unexpected weight change.  HENT:  Negative for congestion, dental problem, ear discharge, ear pain, hearing loss, nosebleeds, postnasal drip, rhinorrhea, sinus pressure, sinus pain, sneezing, sore throat, tinnitus and trouble swallowing.   Eyes:  Negative for pain, discharge, redness, itching and visual disturbance.   Respiratory:  Negative for cough, chest tightness, shortness of breath and wheezing.   Cardiovascular:  Positive for leg swelling. Negative for chest pain and palpitations.  Gastrointestinal:  Positive for abdominal pain. Negative for abdominal distention, blood in stool, constipation, diarrhea, nausea and vomiting.       Chronic abdominal pain  Endocrine: Negative for cold intolerance, heat intolerance, polydipsia, polyphagia and polyuria.  Genitourinary:  Negative for difficulty urinating, dysuria, flank pain, frequency and urgency.  Musculoskeletal:  Negative for arthralgias, back pain, gait problem, joint swelling, myalgias, neck pain and neck stiffness.  Skin:  Negative for color change, pallor, rash and wound.  Neurological:  Positive for numbness. Negative for dizziness, syncope, speech difficulty, weakness, light-headedness and headaches.       Numbness on the hands and feet  Hematological:  Does not bruise/bleed easily.  Psychiatric/Behavioral:  Negative for agitation, behavioral problems, confusion, hallucinations, self-injury, sleep disturbance and suicidal ideas. The patient is not nervous/anxious.     Immunization History  Administered Date(s) Administered   Influenza,inj,Quad PF,6+ Mos 09/15/2021   Influenza-Unspecified 08/09/2022   Tdap  10/08/2022   Pertinent  Health Maintenance Due  Topic Date Due   OPHTHALMOLOGY EXAM  Never done   FOOT EXAM  09/18/2024   Influenza Vaccine  03/19/2025 (Originally 06/08/2024)   HEMOGLOBIN A1C  05/15/2025      07/06/2023    8:59 AM 11/07/2023    9:55 AM 02/16/2024    8:03 AM 04/04/2024   10:52 AM 06/21/2024    2:12 PM  Fall Risk  Falls in the past year? 0 0 0 0 0  Was there an injury with Fall? 0  0  0  0  0   Fall Risk Category Calculator 0 0 0 0 0  Patient at Risk for Falls Due to No Fall Risks   No Fall Risks No Fall Risks  Fall risk Follow up Falls evaluation completed;Education provided;Falls prevention discussed  Falls  evaluation completed Falls evaluation completed Falls evaluation completed     Data saved with a previous flowsheet row definition   Functional Status Survey:    Vitals:   11/15/24 1109  BP: 114/68  Pulse: 82  Resp: 18  Temp: 97.9 F (36.6 C)  SpO2: 96%  Weight: 152 lb 12.8 oz (69.3 kg)  Height: 6' 5 (1.956 m)   Body mass index is 18.12 kg/m. Physical Exam  VITALS: T- 97.9, P- 82, BP- 114/68, SaO2- 96% MEASUREMENTS: Weight- 152. GENERAL: Alert, cooperative, well developed, no acute distress. HEENT: Normocephalic, normal oropharynx, moist mucous membranes, cerumen impaction bilaterally. NECK: No cervical lymphadenopathy. CHEST: Clear to auscultation bilaterally, no wheezes, rhonchi, or crackles, chest symmetric, normal excursion. CARDIOVASCULAR: Regular rate and rhythm, S1 and S2 normal without murmurs. ABDOMEN: Soft, non-tender, non-distended, without organomegaly, normal bowel sounds. EXTREMITIES: No clubbing, cyanosis, or edema. NEUROLOGICAL: Cranial nerves grossly intact, moves all extremities without gross motor deficit, sensation intact except decreased on left foot.  SKIN: No rash,no lesion or erythema   PSYCHIATRY/BEHAVIORAL: Mood stable    Labs reviewed: Recent Labs    08/02/24 0305 08/02/24 0426 11/08/24 0324 11/08/24 0637 11/09/24 0232  NA 135   < > 129* 130* 131*  K 3.7   < > 3.7 3.4* 3.4*  CL 94*   < > 96* 98 99  CO2 13*   < > 18* 19* 20*  GLUCOSE 196*   < > 191* 174* 164*  BUN <5*   < > 15 15 15   CREATININE 0.78   < > 1.34* 1.29* 1.15  CALCIUM  9.6   < > 9.3 9.5 9.7  MG 1.8  --   --  1.3* 2.0  PHOS 2.7  --   --  <1.0* 1.4*   < > = values in this interval not displayed.   Recent Labs    08/03/24 1027 08/04/24 0612 08/08/24 1614 11/07/24 0915  AST 61* 55* 74* 98*  ALT 45* 46* 47* 85*  ALKPHOS 166* 172*  --  226*  BILITOT 0.8 0.8 0.8 0.6  PROT 4.8* 5.2* 6.2 8.7*  ALBUMIN  3.3* 3.3*  --  5.0   Recent Labs    04/04/24 1146 08/01/24 2348  11/07/24 0736 11/07/24 0757 11/07/24 1656 11/08/24 0823 11/15/24 1155  WBC 3.3*   < > 12.0*  --  12.8* 8.2 3.1*  NEUTROABS 1,558  --  10.3*  --   --   --  1,686  HGB 12.1*   < > 16.3   < > 13.4 11.5* 8.8*  HCT 35.7*   < > 50.0   < > 37.4* RESULTS UNAVAILABLE DUE  TO INTERFERING SUBSTANCE 27.3*  MCV 107.5*   < > 109.4*  --  99.7 RESULTS UNAVAILABLE DUE TO INTERFERING SUBSTANCE 107.5*  PLT 282   < > 157  --  83* 73* 223   < > = values in this interval not displayed.   Lab Results  Component Value Date   TSH 0.51 11/15/2024   Lab Results  Component Value Date   HGBA1C 8.9 (H) 11/15/2024   Lab Results  Component Value Date   CHOL 104 03/10/2022   HDL 56.50 03/10/2022   LDLCALC 18 03/10/2022   TRIG 153 (H) 07/20/2022   CHOLHDL 2 03/10/2022    Significant Diagnostic Results in last 30 days:  DG Chest 2 View Result Date: 11/07/2024 CLINICAL DATA:  Shortness of breath EXAM: CHEST - 2 VIEW COMPARISON:  August 01, 2024 FINDINGS: The heart size and mediastinal contours are within normal limits. Both lungs are clear. The visualized skeletal structures are unremarkable. IMPRESSION: No active cardiopulmonary disease. Electronically Signed   By: Lynwood Landy Raddle M.D.   On: 11/07/2024 08:50    Assessment/Plan  Type 1 diabetes mellitus with hyperglycemia Blood sugars fluctuate between mid to high 100s. Recent hospitalization for DKA in December. Current insulin  regimen includes Novolog  5-10 units on sliding scale and Lantus  16 units daily. No recent A1c check during hospitalization. - Ordered A1c test - Continue current insulin  regimen  Alcohol-induced chronic pancreatitis Persistent pain in the pancreas area, rated 8-9/10, with some easing but not resolving. Pain is localized to the left side. - Continue current pain management regimen  Edema and pain of extremities and face Swelling and pain in hands, feet, and face for the last three days. Pain described as stabbing and burning,  with difficulty walking and gripping. No changes in diet or medication. Compression stockings used. Gabapentin  helps with hand pain but not with foot pain. Tylenol  ineffective for pain relief. - Ordered inflammation markers (reactive protein and sed rate) - Ordered protein levels - Recommended ibuprofen  for inflammation - Referred to podiatrist for foot evaluation  Gastroesophageal reflux disease with esophagitis Currently taking Prilosec (omeprazole ) once daily. Protonix  was previously listed but not taken. - Continue Prilosec (omeprazole ) once daily  Essential hypertension Blood pressure well-controlled at 114/68 mmHg.   Family/ staff Communication: Reviewed plan of care with patient verbalized understanding  Labs/tests ordered:  - CBC with Differential/Platelet - C-reactive protein  - Sed Rate  - TSH - Hgb A1C - Urine microalbumin creatinine ratio   Next Appointment : Return in about 6 months (around 05/15/2025) for medical mangement of chronic issues.SABRA   Spent 30 minutes of Face to face and non-face to face with patient  >50% time spent counseling; reviewing medical record; tests; labs; documentation and developing future plan of care.   Purl Claytor C Forever Arechiga, NP      [1]  Allergies Allergen Reactions   Other Anaphylaxis and Other (See Comments)    [name] - anaphylaxis   "

## 2024-11-26 ENCOUNTER — Other Ambulatory Visit (HOSPITAL_BASED_OUTPATIENT_CLINIC_OR_DEPARTMENT_OTHER): Payer: Self-pay

## 2024-11-28 ENCOUNTER — Ambulatory Visit: Admitting: Podiatry

## 2024-12-01 ENCOUNTER — Other Ambulatory Visit: Payer: Self-pay | Admitting: Family

## 2024-12-01 ENCOUNTER — Other Ambulatory Visit (HOSPITAL_BASED_OUTPATIENT_CLINIC_OR_DEPARTMENT_OTHER): Payer: Self-pay

## 2024-12-01 DIAGNOSIS — K860 Alcohol-induced chronic pancreatitis: Secondary | ICD-10-CM

## 2024-12-03 ENCOUNTER — Other Ambulatory Visit (HOSPITAL_BASED_OUTPATIENT_CLINIC_OR_DEPARTMENT_OTHER): Payer: Self-pay

## 2024-12-03 ENCOUNTER — Other Ambulatory Visit: Payer: Self-pay

## 2024-12-03 MED ORDER — OXYCODONE HCL 5 MG PO TABS
5.0000 mg | ORAL_TABLET | Freq: Three times a day (TID) | ORAL | 0 refills | Status: AC | PRN
  Filled 2024-12-03 – 2024-12-04 (×2): qty 90, 30d supply, fill #0

## 2024-12-03 NOTE — Telephone Encounter (Signed)
 Patient is requesting a refill of the following medications: Requested Prescriptions   Pending Prescriptions Disp Refills   oxyCODONE  (OXY IR/ROXICODONE ) 5 MG immediate release tablet 90 tablet 0    Sig: Take 1 tablet (5 mg total) by mouth every 8 (eight) hours as needed for severe pain (pain score 7-10).    Date of last refill: 11/05/24  Refill amount: 90  Treatment agreement date: October 2025

## 2024-12-04 ENCOUNTER — Other Ambulatory Visit (HOSPITAL_BASED_OUTPATIENT_CLINIC_OR_DEPARTMENT_OTHER): Payer: Self-pay

## 2025-05-15 ENCOUNTER — Ambulatory Visit: Admitting: Family
